# Patient Record
Sex: Female | Born: 1965 | Race: White | Hispanic: No | Marital: Married | Smoking: Current every day smoker
Health system: Southern US, Academic
[De-identification: ages and names within clinical notes are randomized; demographics above are authoritative.]

## PROBLEM LIST (undated history)

## (undated) DIAGNOSIS — K859 Acute pancreatitis without necrosis or infection, unspecified: Secondary | ICD-10-CM

## (undated) DIAGNOSIS — M1990 Unspecified osteoarthritis, unspecified site: Secondary | ICD-10-CM

## (undated) DIAGNOSIS — K219 Gastro-esophageal reflux disease without esophagitis: Secondary | ICD-10-CM

## (undated) DIAGNOSIS — T4145XA Adverse effect of unspecified anesthetic, initial encounter: Secondary | ICD-10-CM

## (undated) DIAGNOSIS — T8859XA Other complications of anesthesia, initial encounter: Secondary | ICD-10-CM

## (undated) DIAGNOSIS — G43909 Migraine, unspecified, not intractable, without status migrainosus: Secondary | ICD-10-CM

## (undated) DIAGNOSIS — M549 Dorsalgia, unspecified: Secondary | ICD-10-CM

## (undated) DIAGNOSIS — F32A Depression, unspecified: Secondary | ICD-10-CM

## (undated) DIAGNOSIS — F329 Major depressive disorder, single episode, unspecified: Secondary | ICD-10-CM

## (undated) DIAGNOSIS — G8929 Other chronic pain: Secondary | ICD-10-CM

## (undated) DIAGNOSIS — F419 Anxiety disorder, unspecified: Secondary | ICD-10-CM

## (undated) DIAGNOSIS — Z413 Encounter for ear piercing: Secondary | ICD-10-CM

## (undated) DIAGNOSIS — I6529 Occlusion and stenosis of unspecified carotid artery: Secondary | ICD-10-CM

## (undated) DIAGNOSIS — Z79899 Other long term (current) drug therapy: Secondary | ICD-10-CM

## (undated) DIAGNOSIS — Z9289 Personal history of other medical treatment: Secondary | ICD-10-CM

## (undated) DIAGNOSIS — I639 Cerebral infarction, unspecified: Secondary | ICD-10-CM

## (undated) DIAGNOSIS — E039 Hypothyroidism, unspecified: Secondary | ICD-10-CM

## (undated) DIAGNOSIS — Z7901 Long term (current) use of anticoagulants: Secondary | ICD-10-CM

## (undated) DIAGNOSIS — L818 Other specified disorders of pigmentation: Secondary | ICD-10-CM

## (undated) DIAGNOSIS — IMO0002 Reserved for concepts with insufficient information to code with codable children: Secondary | ICD-10-CM

## (undated) DIAGNOSIS — E669 Obesity, unspecified: Secondary | ICD-10-CM

## (undated) DIAGNOSIS — E119 Type 2 diabetes mellitus without complications: Secondary | ICD-10-CM

## (undated) DIAGNOSIS — Z955 Presence of coronary angioplasty implant and graft: Secondary | ICD-10-CM

## (undated) DIAGNOSIS — R6889 Other general symptoms and signs: Secondary | ICD-10-CM

## (undated) DIAGNOSIS — M51369 Other intervertebral disc degeneration, lumbar region without mention of lumbar back pain or lower extremity pain: Secondary | ICD-10-CM

## (undated) DIAGNOSIS — E079 Disorder of thyroid, unspecified: Secondary | ICD-10-CM

## (undated) DIAGNOSIS — I519 Heart disease, unspecified: Secondary | ICD-10-CM

## (undated) DIAGNOSIS — M5136 Other intervertebral disc degeneration, lumbar region: Secondary | ICD-10-CM

## (undated) DIAGNOSIS — E1165 Type 2 diabetes mellitus with hyperglycemia: Secondary | ICD-10-CM

## (undated) DIAGNOSIS — H0260 Xanthelasma of unspecified eye, unspecified eyelid: Secondary | ICD-10-CM

## (undated) DIAGNOSIS — Z951 Presence of aortocoronary bypass graft: Secondary | ICD-10-CM

## (undated) DIAGNOSIS — I2510 Atherosclerotic heart disease of native coronary artery without angina pectoris: Secondary | ICD-10-CM

## (undated) DIAGNOSIS — E041 Nontoxic single thyroid nodule: Secondary | ICD-10-CM

## (undated) DIAGNOSIS — E7800 Pure hypercholesterolemia, unspecified: Secondary | ICD-10-CM

## (undated) DIAGNOSIS — Z9884 Bariatric surgery status: Secondary | ICD-10-CM

## (undated) DIAGNOSIS — M539 Dorsopathy, unspecified: Secondary | ICD-10-CM

## (undated) DIAGNOSIS — E785 Hyperlipidemia, unspecified: Secondary | ICD-10-CM

## (undated) HISTORY — PX: ERCP: SHX60

## (undated) HISTORY — DX: Type 2 diabetes mellitus with hyperglycemia (CMS HCC): E11.65

## (undated) HISTORY — DX: Disorder of thyroid, unspecified: E07.9

## (undated) HISTORY — DX: Atherosclerotic heart disease of native coronary artery without angina pectoris: I25.10

## (undated) HISTORY — DX: Hyperlipidemia, unspecified: E78.5

## (undated) HISTORY — DX: Cerebral infarction, unspecified: I63.9

## (undated) HISTORY — DX: Nontoxic single thyroid nodule: E04.1

## (undated) HISTORY — DX: Other intervertebral disc degeneration, lumbar region without mention of lumbar back pain or lower extremity pain: M51.369

## (undated) HISTORY — PX: HX ANKLE FRACTURE TX: SHX122

## (undated) HISTORY — PX: HX TONSILLECTOMY: SHX27

## (undated) HISTORY — DX: Reserved for concepts with insufficient information to code with codable children: IMO0002

## (undated) HISTORY — PX: HX THYROID BIOPSY: 2101000001

## (undated) HISTORY — DX: Dorsalgia, unspecified: M54.9

## (undated) HISTORY — DX: Other chronic pain: G89.29

## (undated) HISTORY — DX: Pure hypercholesterolemia, unspecified: E78.00

## (undated) HISTORY — PX: HX THYROIDECTOMY: SHX17

## (undated) HISTORY — DX: Obesity, unspecified: E66.9

## (undated) HISTORY — DX: Occlusion and stenosis of unspecified carotid artery: I65.29

## (undated) HISTORY — DX: Personal history of other medical treatment: Z92.89

## (undated) HISTORY — DX: Xanthelasma of unspecified eye, unspecified eyelid: H02.60

## (undated) HISTORY — DX: Other intervertebral disc degeneration, lumbar region: M51.36

## (undated) HISTORY — DX: Anxiety disorder, unspecified: F41.9

## (undated) HISTORY — DX: Cerebral infarction, unspecified (CMS HCC): I63.9

## (undated) SURGERY — Surgical Case
Anesthesia: *Unknown

---

## 1986-12-08 HISTORY — PX: HX GALL BLADDER SURGERY/CHOLE: SHX55

## 1996-12-08 HISTORY — PX: ENDOMETRIAL ABLATION: SHX621

## 2001-03-30 ENCOUNTER — Other Ambulatory Visit: Payer: Self-pay

## 2001-03-30 ENCOUNTER — Inpatient Hospital Stay (HOSPITAL_COMMUNITY): Payer: Self-pay | Admitting: NEUROSURGERY

## 2001-04-06 ENCOUNTER — Ambulatory Visit (INDEPENDENT_AMBULATORY_CARE_PROVIDER_SITE_OTHER): Payer: Self-pay | Admitting: Neurological Surgery

## 2001-04-15 ENCOUNTER — Ambulatory Visit (INDEPENDENT_AMBULATORY_CARE_PROVIDER_SITE_OTHER): Payer: Self-pay | Admitting: Neurological Surgery

## 2001-06-25 ENCOUNTER — Encounter: Payer: Self-pay | Admitting: Emergency Medicine

## 2001-06-25 ENCOUNTER — Emergency Department (HOSPITAL_COMMUNITY): Admission: EM | Admit: 2001-06-25 | Discharge: 2001-06-25 | Payer: Self-pay | Admitting: Emergency Medicine

## 2002-04-15 ENCOUNTER — Other Ambulatory Visit: Payer: Self-pay

## 2002-04-15 ENCOUNTER — Ambulatory Visit (HOSPITAL_COMMUNITY): Payer: Self-pay | Admitting: Neuroradiology

## 2002-04-28 ENCOUNTER — Ambulatory Visit (INDEPENDENT_AMBULATORY_CARE_PROVIDER_SITE_OTHER): Payer: Self-pay | Admitting: Neurological Surgery

## 2002-12-28 ENCOUNTER — Other Ambulatory Visit: Admission: RE | Admit: 2002-12-28 | Discharge: 2002-12-28 | Payer: Self-pay | Admitting: Obstetrics and Gynecology

## 2003-03-07 ENCOUNTER — Ambulatory Visit (INDEPENDENT_AMBULATORY_CARE_PROVIDER_SITE_OTHER): Payer: Self-pay | Admitting: Neurological Surgery

## 2003-03-24 ENCOUNTER — Ambulatory Visit (HOSPITAL_COMMUNITY): Payer: Self-pay | Admitting: Neuroradiology

## 2004-03-12 ENCOUNTER — Ambulatory Visit (HOSPITAL_COMMUNITY): Payer: Self-pay | Admitting: Neuroradiology

## 2005-03-18 ENCOUNTER — Ambulatory Visit (INDEPENDENT_AMBULATORY_CARE_PROVIDER_SITE_OTHER): Payer: Self-pay | Admitting: Neurological Surgery

## 2008-08-07 ENCOUNTER — Ambulatory Visit: Payer: Self-pay | Admitting: Cardiology

## 2008-09-19 ENCOUNTER — Ambulatory Visit: Payer: Self-pay | Admitting: Cardiology

## 2008-09-29 ENCOUNTER — Emergency Department (HOSPITAL_COMMUNITY): Admission: EM | Admit: 2008-09-29 | Discharge: 2008-09-30 | Payer: Self-pay | Admitting: Emergency Medicine

## 2008-12-08 HISTORY — PX: CHOLECYSTECTOMY: SHX55

## 2009-09-10 ENCOUNTER — Ambulatory Visit: Payer: Self-pay | Admitting: Cardiology

## 2009-12-08 HISTORY — PX: COLONOSCOPY: SHX174

## 2010-02-04 ENCOUNTER — Ambulatory Visit: Payer: Self-pay | Admitting: Gastroenterology

## 2010-10-18 ENCOUNTER — Other Ambulatory Visit: Payer: Self-pay

## 2010-10-21 LAB — HISTORICAL CYTOPATHOLOGY-GYN (PAP AND HPV TESTS)

## 2010-12-08 DIAGNOSIS — I219 Acute myocardial infarction, unspecified: Secondary | ICD-10-CM | POA: Insufficient documentation

## 2010-12-08 HISTORY — DX: Acute myocardial infarction, unspecified: I21.9

## 2010-12-08 HISTORY — PX: HX CORONARY STENT PLACEMENT: SHX49

## 2010-12-08 HISTORY — PX: HX HEART CATHETERIZATION: SHX148

## 2011-02-28 ENCOUNTER — Other Ambulatory Visit (HOSPITAL_COMMUNITY): Payer: Self-pay | Admitting: Internal Medicine

## 2011-03-10 ENCOUNTER — Ambulatory Visit (HOSPITAL_BASED_OUTPATIENT_CLINIC_OR_DEPARTMENT_OTHER): Payer: Self-pay | Admitting: Cardiovascular Disease

## 2011-03-19 ENCOUNTER — Encounter (HOSPITAL_BASED_OUTPATIENT_CLINIC_OR_DEPARTMENT_OTHER): Payer: Self-pay | Admitting: Physician Assistant

## 2011-03-19 ENCOUNTER — Ambulatory Visit
Admission: RE | Admit: 2011-03-19 | Discharge: 2011-03-19 | Disposition: A | Discharge status: Home / Self Care | Payer: Medicare Other | Source: Ambulatory Visit

## 2011-03-19 ENCOUNTER — Ambulatory Visit (HOSPITAL_BASED_OUTPATIENT_CLINIC_OR_DEPARTMENT_OTHER): Payer: Medicare Other

## 2011-03-19 ENCOUNTER — Encounter (HOSPITAL_BASED_OUTPATIENT_CLINIC_OR_DEPARTMENT_OTHER): Payer: Self-pay

## 2011-03-19 VITALS — BP 126/76 | HR 115 | Ht 69.0 in | Wt 251.3 lb

## 2011-03-19 DIAGNOSIS — E785 Hyperlipidemia, unspecified: Secondary | ICD-10-CM | POA: Insufficient documentation

## 2011-03-19 DIAGNOSIS — F17200 Nicotine dependence, unspecified, uncomplicated: Secondary | ICD-10-CM | POA: Insufficient documentation

## 2011-03-19 DIAGNOSIS — Z8673 Personal history of transient ischemic attack (TIA), and cerebral infarction without residual deficits: Secondary | ICD-10-CM | POA: Insufficient documentation

## 2011-03-19 DIAGNOSIS — Z7902 Long term (current) use of antithrombotics/antiplatelets: Secondary | ICD-10-CM | POA: Insufficient documentation

## 2011-03-19 DIAGNOSIS — I6529 Occlusion and stenosis of unspecified carotid artery: Secondary | ICD-10-CM | POA: Insufficient documentation

## 2011-03-19 DIAGNOSIS — E119 Type 2 diabetes mellitus without complications: Secondary | ICD-10-CM | POA: Insufficient documentation

## 2011-03-19 DIAGNOSIS — Z8249 Family history of ischemic heart disease and other diseases of the circulatory system: Secondary | ICD-10-CM | POA: Insufficient documentation

## 2011-03-19 DIAGNOSIS — Z7982 Long term (current) use of aspirin: Secondary | ICD-10-CM | POA: Insufficient documentation

## 2011-03-19 MED ORDER — VARENICLINE 0.5 MG (11)-1 MG (42) TABLETS IN A DOSE PACK
1.00 | ORAL_TABLET | ORAL | Status: DC
Start: 2011-03-19 — End: 2011-04-10

## 2011-03-19 NOTE — Progress Notes (Signed)
Patient notified and will call PCP.      Message   No  Try OTC nicotine patches or see PCP for wellbutrin  ----- Message -----  From: Edwena Bunde, LPN  Sent: 0/27/2536 1:53 PM  To: Placido Sou, PA-C    Prince Rome,  Insurance will not pay for chantix. Can you switch to something else.  Thanks Rakesh Dutko  ----- Message -----  From: Perlie Mayo  Sent: 03/19/2011 1:31 PM  To: Card Hsc Nurses    >> WHITNEY NICOLE ROGERS 03/19/2011 01:31 PM  [facility] called in this afternoon in regards to Ms. Sanon's rx for Chantix. Her insurance will not cover it. They recommend Welbutrin or the Nicatrol inhaler. Patient's cardiologist is Dr. Caralyn Guile. Thank you!

## 2011-03-19 NOTE — Patient Instructions (Signed)
Angiography  Angiography is a procedure used to look at the blood vessels (arteries) which carry the blood to different parts of your body. In this procedure a dye is injected through a catheter (a long, hollow tube about the size of a piece of cooked spaghetti) into an artery and x-rays are taken. The x-rays will show if there is a blockage or problem in a blood vessel.    PREPARATION FOR THE PROCEDURE   Let your caregiver know if you have had an allergy to dyes used in x-ray if you have ever had kidney problems or failure.    Do not eat or drink starting from midnight up to the time of the procedure, or as directed.    You may drink enough water to take your medications the morning of the procedure if you were instructed to do so.    You should be at the hospital or outpatient facility where the procedure is to be done tbd prior to the procedure or as directed.   PROCEDURE:  1. You may be given a medication to help you relax before and during the procedure through an IV in your hand or arm.   2. A local anesthetic to make the area numb may be used before inserting the catheter.   3. You will be prepared for the procedure by washing and shaving the area where the catheter will be inserted. This is usually done in the groin but may be done in the fold of your arm by your elbow.   4. A specially trained doctor will insert the catheter with a guide wire into an artery. This is guided under a special type of x-ray (fluoroscopy) to the blood vessel being examined.   5. Special dye is then injected and x-rays are taken. These will show where any narrowing or blockages are located.   AFTER THE PROCEDURE   After the procedure you will be kept in bed for several hours.    The access site will be watched and you will be checked frequently.    Blood tests, other x-rays and an EKG may be done.    You may stay in the hospital overnight for observation.   SEEK IMMEDIATE MEDICAL CARE IF:    You develop chest pain, shortness of breath, feel faint, or pass out.    There is bleeding, swelling, or drainage from the catheter insertion site.    You develop pain, discoloration, coldness, or severe bruising in the leg or arm, or area where the catheter was inserted.    An oral temperature above 101 develops.   Document Released: 09/03/2005 Document Re-Released: 03/25/2008  [facility] Patient Information 2011 [place], [place].Carotid Angiography with Stenting  The carotid arteries are the large arteries on both sides in the front of the neck. These arteries are the main blood supply to the brain. Carotid ultrasound studies are done to look for problems in the carotid artery. Like the blood vessels of the heart (coronary arteries), the carotid arteries also develop atherosclerosis. This is the build-up of fat and cholesterol deposits, called plaque, on the inside of the arteries. Over time, the build-up may narrow the artery. The narrowing will lessen blood flow to the brain. If the brain does not get enough blood, this may lead to a stroke.    Carotid angiography is also called carotid angiogram or a carotid arteriogram. This test is done when carotid artery disease is suspected. This is based on the results from other testing such  as an ultrasound. An arteriogram is often done prior to treatment.    PROCEDURE  This is an invasive imaging procedure done in a catheterization laboratory.     A catheter (small slender tube) is put into an artery so dye can be injected. The dye allows the inside of the vessels to be seen on x-ray.    A local anesthetic (numbing medicine) is used to numb the area where the catheter will be inserted.    A small puncture is made in the arm or leg and a catheter is inserted. The catheter goes into the artery and is guided to the carotid arteries with the help of a special x-ray machine (fluoroscope).     Contrast dye is injected through the catheter. The dye is something that shows up on x-ray. This allows the radiologist (specialist in reading x-rays) to see the inside of your carotid artery.    These x-rays of your carotid arteries allow your caregiver to know if your carotid artery has narrowing or blockage.    Following the procedure, a dressing will be applied and pressure will be put on the area of where the catheter was inserted. Sometimes this is done with a sandbag. Often the pressure should last less than 24 hours.    If there are no problems, you will be allowed to go home.   If you are diagnosed as having carotid stenosis (narrowing), the carotid artery has narrowed because of the buildup of plaques (usually cholesterol, fats and cells). These changes happen as we grow older. This blockage decreases the blood flow to the brain. It may cause transient ischemic attacks (TIA's). TIA's are temporary brain changes that last less than 24 hours. If the symptoms (problems) last longer, this is considered a stroke or permanent problems in your nervous system.    SYMPTOMS   Feeling faint    Numbness in arms or legs    Difficulty with movements    Weakness in an arm or leg    Visual problems     If the artery is left untreated, you are at risk of having a stroke. If severe enough, the condition may require surgical removal of the buildup. This procedure is called a carotid endarterectomy. Another procedure used for treating a blocked carotid artery is stenting. Your caregiver will discuss what will be the best treatment in your case.   Carotid stenting is an investigational (new or experimental) procedure. A specially designed guide wire with a filter is placed beyond the area of narrowing in the carotid artery. Once in place, a small balloon is inflated for a few seconds to dilate the artery. The stent (a small titanium mesh tube that acts as a splint inside your artery to provide support) is then placed in the artery. This opens to fit the size of the artery. The filter, also known as a "distal protection device," is used to capture any particles that are released. It prevents them from going to the brain and causing a stroke. A second balloon inflation is done to make sure the stent is completely expanded in your carotid artery. The stent stays in place permanently. After several weeks, your artery will heal around the stent.    Document Released: 04/07/2005 Document Re-Released: 02/18/2010  [facility] Patient Information 2011 [place], [place].Smoking Cessation, Tips for Success  YOU CAN QUIT SMOKING  If you are ready to quit smoking, congratulations! You have chosen to help yourself be healthier. Cigarettes bring nicotine, tar, carbon monoxide,  and other irritants into your body. Your lungs, heart, and blood vessels will be able to work better without these poisons. There are lots of different ways to quit smoking. Nicotine gum, nicotine patches, a nicotine inhaler, or nicotine nasal spray help with physical craving. Hypnosis, support groups, and medicines help break the habit of smoking. Here are some tips to help you quit for good.  1. Throw away all cigarettes.   2. Clean and remove all ashtrays from your home, work, and car.   3. On a card, write down your reasons for quitting. Carry the card with you and read it when you get the urge to smoke.   4. Cleanse your body of nicotine. Drink enough water and fluids to keep your urine clear or pale yellow. Do this after quitting to flush the nicotine from your body.    5. Learn to predict your moods. Do not let a bad situation be your excuse to have a cigarette. Some situations in your life might tempt you into wanting a cigarette.   6. Never have "just one" cigarette. It leads to wanting another and another. Remind yourself of your decision to quit.   7. Change habits associated with smoking. If you smoked while driving or when feeling stressed, try other activities to replace smoking. Stand up when drinking your coffee. Brush your teeth after eating. Sit in a different chair when you read the paper. Avoid alcohol while trying to quit, and try to drink fewer caffeinated beverages. Alcohol and caffeine may urge you to smoke.   8. Avoid foods and drinks that can trigger a desire to smoke, such as sugary or spicy foods and alcohol.   9. Ask people who smoke not to smoke around you.   10. Have something planned to do right after eating or having a cup of coffee. Take a walk or exercise to perk you up. This will help to keep you from overeating.   11. Try a relaxation exercise to calm you down and decrease your stress. Remember, you may be tense and nervous in the first 2 weeks after you quit, but this will pass.   12. Find new activities to keep your hands busy. Play with a pen, coin, or rubber band. Doodle or draw things on paper.   13. Brush your teeth right after eating. This will help cut down on the craving for the taste of tobacco after meals. You can try mouthwash, too.   14. Use oral substitutes, such as lemon drops, carrots, a cinnamon stick, or chewing gum, in place of cigarettes. Keep them handy so they are available when you have the urge to smoke.   15. When you have the urge to smoke, try deep breathing.   16. Designate your home as a nonsmoking area.   17. If you are a heavy smoker, ask your caregiver about a prescription for nicotine chewing gum. It can ease your withdrawal from nicotine.    18. Reward yourself. Set aside the cigarette money you save and buy yourself something nice.   19. Look for support from others. Join a support group or smoking cessation program. Ask someone at home or at work to help you with your plan to quit smoking.   20. Always ask yourself, "Do I need this cigarette or is this just a reflex?" Tell yourself, "Today, I choose not to smoke," or "I do not want to smoke." You are reminding yourself of your decision to quit, even if you  do smoke a cigarette.   HOW WILL I FEEL WHEN I QUIT SMOKING?   The benefits of not smoking start within days of quitting.    You may have symptoms of withdrawal because your body is used to nicotine (the addictive substance in cigarettes). You may crave cigarettes, be irritable, feel very hungry, cough often, get headaches, or have difficulty concentrating.    The withdrawal symptoms are only temporary. They are strongest when you first quit but will go away within 10 to 14 days.    When withdrawal symptoms occur, stay in control. Think about your reasons for quitting. Remind yourself that these are signs that your body is healing and getting used to being without cigarettes.    Remember that withdrawal symptoms are easier to treat than the major diseases that smoking can cause.    Even after the withdrawal is over, expect periodic urges to smoke. However, these cravings are generally short-lived and will go away whether you smoke or not. Do not smoke!    If you relapse and smoke again, do not lose hope. Of the people who quit, 75% relapse. Most smokers quit 3 times before they are successful.    If you relapse, do not give up! Plan ahead and think about what you will do the next time you get the urge to smoke.   LIFE AS A NONSMOKER: MAKE IT FOR A MONTH, MAKE IT FOR LIFE  Day 1 Hang this page where you will see it every day.  Day 2 Get rid of all ashtrays, matches, and lighters.  Day 3 Drink water. Breathe deeply between sips.   Day 4 Avoid places with smoke-filled air, such as bars, clubs, or the smoking section of restaurants.  Day 5 Keep track of how much money you save by not smoking.  Day 6 Avoid boredom. Keep a good book with you or go to the movies.  Day 7 Reward yourself! One week without smoking!  Day 8 Make a dental appointment to get your teeth cleaned.  Day 9 Decide how you will turn down a cigarette before it is offered to you.  Day 10 Review your reasons for quitting.  Day 11 Distract yourself. Stay active to keep your mind off smoking and to relieve tension.  Take a walk, exercise, read a book, do a crossword puzzle, or try a new hobby.  Day 12 Exercise. Get off the bus before your stop or use stairs instead of escalators.  Day 13 Call on friends for support and encouragement.  Day 14 Reward yourself! Two weeks without smoking!  Day 15 Practice deep breathing exercises.  Day 16 Bet a friend that you can stay a nonsmoker.  Day 17 Ask to sit in nonsmoking sections of restaurants.  Day 18 Hang up "No Smoking" signs.  Day 19 Think of yourself as a nonsmoker.  Day 20 Each morning, tell yourself you will not smoke.  Day 21 Reward yourself! Three weeks without smoking!  Day 22 Think of smoking in negative ways.    Remember how it stains your teeth, gives you bad breath, and shortens your breath.  Day 23 Eat a nutritious breakfast.  Day 24 Do not relive your days as a smoker.  Day 25 Hold a pencil in your hand when talking on the telephone.  Day 26 Tell all your friends you do not smoke.  Day 27 Think about how much better food tastes.  Day 28 Remember, one cigarette is one too many.  Day 29 Take up a hobby that will keep your hands busy.  Day 30 Congratulations! One month without smoking! Give yourself a big reward.  Your caregiver can direct you to community resources or hospitals for support, which may include:   Group support.    Education.    Hypnosis.    Subliminal therapy.    Document Released: 08/22/2004 Document Re-Released: 02/18/2010  [facility] Patient Information 2011 [place], [place].

## 2011-03-19 NOTE — Progress Notes (Signed)
See dictated note.    KK

## 2011-03-24 ENCOUNTER — Ambulatory Visit (HOSPITAL_COMMUNITY): Payer: Medicare Other

## 2011-03-24 ENCOUNTER — Encounter (HOSPITAL_COMMUNITY): Payer: Self-pay

## 2011-03-24 ENCOUNTER — Ambulatory Visit (HOSPITAL_BASED_OUTPATIENT_CLINIC_OR_DEPARTMENT_OTHER)
Admission: RE | Admit: 2011-03-24 | Discharge: 2011-03-24 | Disposition: A | Discharge status: Home / Self Care | Payer: Medicare Other | Source: Ambulatory Visit

## 2011-03-24 ENCOUNTER — Inpatient Hospital Stay
Admission: RE | Admit: 2011-03-24 | Discharge: 2011-03-24 | Disposition: A | Discharge status: Home / Self Care | Payer: Medicare Other | Source: Ambulatory Visit

## 2011-03-24 ENCOUNTER — Inpatient Hospital Stay (HOSPITAL_COMMUNITY): Payer: Self-pay | Admitting: Emergency Medicine

## 2011-03-24 DIAGNOSIS — I2510 Atherosclerotic heart disease of native coronary artery without angina pectoris: Secondary | ICD-10-CM | POA: Insufficient documentation

## 2011-03-24 DIAGNOSIS — I6529 Occlusion and stenosis of unspecified carotid artery: Secondary | ICD-10-CM | POA: Insufficient documentation

## 2011-03-24 LAB — PT/INR
INR: 0.9 (ref 0.8–1.2)
PROTHROMBIN TIME: 9.9 s (ref 9.1–11.5)

## 2011-03-24 LAB — TYPE AND SCREEN
ABO/RH(D): O POS
ANTIBODY SCREEN: NEGATIVE

## 2011-03-24 LAB — BUN
BUN/CREAT RATIO: 18 (ref 6–22)
BUN: 14 mg/dL (ref 6–20)

## 2011-03-24 LAB — URINALYSIS, MACROSCOPIC AND MICROSCOPIC
BILIRUBIN: NEGATIVE
BLOOD: NEGATIVE
GLUCOSE: NEGATIVE mg/dL
KETONES: NEGATIVE mg/dL
LEUKOCYTES: NEGATIVE
NITRITE: NEGATIVE
PH URINE: 6.5 (ref 5.0–8.0)
PROTEIN: NEGATIVE mg/dL
RBC'S: 1 /HPF (ref 0–4)
SPECIFIC GRAVITY, URINE: 1.04 — ABNORMAL HIGH (ref 1.005–1.030)
UROBILINOGEN: NORMAL mg/dL
WBC'S: 1 /HPF (ref 0–6)

## 2011-03-24 LAB — CREATININE WITH EGFR
CREATININE: 0.78 mg/dL (ref 0.49–1.10)
ESTIMATED GLOMERULAR FILTRATION RATE: >59 ml/min/1.73m2 (ref >59)

## 2011-03-24 LAB — CBC/DIFF
BASOPHILS: 1 % (ref 0–1)
BASOS ABS: 0.052 THOU/uL (ref 0.0–0.2)
EOS ABS: 0.378 THOU/uL — ABNORMAL HIGH (ref 0.1–0.3)
EOSINOPHIL: 4 % (ref 1–6)
HGB: 13.9 g/dL (ref 11.5–15.2)
LYMPHOCYTES: 32 % (ref 20–45)
LYMPHS ABS: 3.05 THOU/uL (ref 1.0–4.8)
MCH: 29.6 pg (ref 27.4–33.0)
MCV: 89.4 fL (ref 82.0–99.0)
MONOCYTES: 10 % (ref 4–13)
MONOS ABS: 0.915 THOU/uL — ABNORMAL HIGH (ref 0.1–0.9)
NRBC'S: 0 /100{WBCs}
PLATELET COUNT: 360 THOU/uL (ref 140–450)
PMN ABS: 5.22 10*3/uL (ref 1.5–7.7)
RBC: 4.68 MIL/uL (ref 3.84–5.04)
RDW: 12.2 % (ref 10.2–14.0)
WBC: 9.6 THOU/uL (ref 3.5–11.0)

## 2011-03-24 LAB — PERFORM POC WHOLE BLOOD GLUCOSE: GLUCOSE, POINT OF CARE: 95 mg/dL (ref 70–105)

## 2011-03-24 LAB — ELECTROLYTES
CARBON DIOXIDE: 24 mmol/L (ref 22–32)
CHLORIDE: 108 mmol/L (ref 96–111)
POTASSIUM: 4.1 mmol/L (ref 3.5–5.1)
SODIUM: 139 mmol/L (ref 136–145)

## 2011-03-24 LAB — PTT (PARTIAL THROMBOPLASTIN TIME): APTT: 24.3 s (ref 22.0–32.0)

## 2011-03-24 MED ORDER — FENTANYL (PF) 50 MCG/ML INJECTION SOLUTION
INTRAMUSCULAR | Status: AC
Start: 2011-03-24 — End: 2011-03-24
  Filled 2011-03-24: qty 2

## 2011-03-24 MED ORDER — MIDAZOLAM 1 MG/ML INJECTION SOLUTION
INTRAMUSCULAR | Status: AC
Start: 2011-03-24 — End: 2011-03-24
  Filled 2011-03-24: qty 2

## 2011-03-24 MED ORDER — SODIUM CHLORIDE 0.9 % (FLUSH) INJECTION SYRINGE
2.00 mL | INJECTION | INTRAMUSCULAR | Status: DC | PRN
Start: 2011-03-24 — End: 2011-03-24

## 2011-03-24 MED ORDER — LIDOCAINE HCL 20 MG/ML (2 %) INJECTION SOLUTION
5.00 mL | INTRAMUSCULAR | Status: DC
Start: 2011-03-24 — End: 2011-03-24

## 2011-03-24 MED ORDER — FENTANYL (PF) 50 MCG/ML INJECTION SOLUTION
25.00 ug | INTRAMUSCULAR | Status: AC | PRN
Start: 2011-03-24 — End: 2011-03-24
  Administered 2011-03-24 (×3): 25 ug via INTRAVENOUS

## 2011-03-24 MED ORDER — SODIUM CHLORIDE 0.9 % (FLUSH) INJECTION SYRINGE
2.00 mL | INJECTION | Freq: Three times a day (TID) | INTRAMUSCULAR | Status: DC
Start: 2011-03-24 — End: 2011-03-24

## 2011-03-24 MED ORDER — DEXTROSE 5 % AND 0.45 % SODIUM CHLORIDE INTRAVENOUS SOLUTION
INTRAVENOUS | Status: DC
Start: 2011-03-24 — End: 2011-03-24
  Administered 2011-03-24: 0 via INTRAVENOUS

## 2011-03-24 MED ORDER — MIDAZOLAM 1 MG/ML INJECTION SOLUTION
0.50 mg | INTRAMUSCULAR | Status: AC | PRN
Start: 2011-03-24 — End: 2011-03-24
  Administered 2011-03-24: 1 mg via INTRAVENOUS

## 2011-03-24 MED ORDER — IODIXANOL 320 MG IODINE/ML INTRAVENOUS SOLUTION
95.00 mL | INTRAVENOUS | Status: DC
Start: 2011-03-24 — End: 2011-03-24

## 2011-03-24 NOTE — Nurses Notes (Signed)
Ambulatory. Tolerating and retaining PO fluids and solids. Able to void without difficulty.

## 2011-03-24 NOTE — Nurses Notes (Signed)
States headache rates 8 on scale of 1 to 10 that decreased to a 5 after eating and drinking. Dr.Gharib aware of headache.

## 2011-03-24 NOTE — H&P (Addendum)
[facility]   CARDIAC SURGERY DEPARTMENTCONSULTATION     History and Physical    Date of Service:  03/24/2011  Monique [name]  Date of Admission:  03/24/2011    Chief Complaint: dyspnea    Primary Service: Med C  Consult Requested By: Caralyn Guile    HPI:  This is a 45 YO female who was referred from cardiology following her elective outpatient cardiac cath.  Pt was cathed secondary to dyspnea.  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  Pt is currently asymptomatic.    ROS   Constitutional: negative  Respiratory: negative  Cardiovascular: positive for dyspnea  Gastrointestinal: negative      Information Obtained from patient    Past Medical History   Diagnosis Date   . Diabetes mellitus type II    . Hyperlipidemia    . Stroke    . HTN    . Seizure          Cardiac Risk Factors:    CAD:  Yes  MI: No  History of CVA: No  History of TIA: No  Dyslipidemia: Yes  Hyperlipidemia: Yes  Tobacco use current:  reports that she has been smoking cigarettes.  She has a 5 pack-year smoking history. She does not have any smokeless tobacco history on file.  Illicit drug use: No  ETOH:  reports that she does not drink alcohol.  Obesity: Yes  History of stents/PTCA: No  Pacemaker/ICD: No  Diabetes managed by: Oral Meds  PVD: No  Family History of CAD female less than 100 yrs of age: No  Family History of CAD female less than 42 yrs of age: No  Diagnosis of Sleep apnea CPAP:not used   Home oxygen currently used: No  History of radiation to mediastium: No  Cancer within 5 years: No  Syncope:  no  Any heart failure within 2 weeks:  No    Allergies   Allergen Reactions   . No Known Drug Allergies          Prior to Admission Medications:  Medications Prior to Admission    Medication    Aspirin 81 mg Oral Tablet    take 81 mg by mouth Once a day.    glimepiride (AMARYL) 2 mg Oral Tablet    take 2 mg by mouth. Twice daily     metformin (GLUCOPHAGE) 500 mg Oral Tablet    take 500 mg by mouth Twice daily with food.     niacin (NIASPAN) 500 mg Oral Tablet Sustained Release    take 500 mg by mouth Once a day.    Hydrocodone-Acetaminophen (NORCO) 7.5-325 mg Oral Tablet    take 1 Tab by mouth Every 6 hours as needed.    Amlodipine-Atorvastatin (CADUET) 5-40 mg Oral Tablet    take 1 Tab by mouth Once a day.    atorvastatin (LIPITOR) 40 mg Oral Tablet    take 40 mg by mouth Once a day.    varenicline (CHANTIX) dose pak     take 1 Tab by mouth. Starter pack, take as directed.    amlodipine (NORVASC) 5 mg Oral Tablet    take 5 mg by mouth Once a day.    clopidogrel (PLAVIX) 75 mg Oral Tablet    take 75 mg by mouth Once a day.           Current Inpatient Medications:    Current facility-administered medications:  NS flush syringe  2 mL Intracatheter Q8HRS   NS  flush syringe  2-6 mL Intracatheter Q1 MIN PRN   D5W 1/2 NS premix infusion   Intravenous Continuous   fentanyl (SUBLIMAZE) 50 mcg/mL injection  25-50 mcg Intravenous Cath Lab 3rd Floor Q5 Min PRN   midazolam (VERSED) 1 mg/mL injection  0.5-2 mg Intravenous Cath Lab 3rd Floor Q5 Min PRN   iodixanol (VISIPAQUE 320) infusion  95 mL Intravenous Give in Cath Lab 3rd floor   lidocaine 2% injection  5 mL Intradermal Give in Cath Lab 3rd floor         Past Surgical History   Procedure Date   . Hx cholecystectomy    . Hx tonsillectomy    . Hx cataract removal      r and l eyes with implants         Family History   Problem Relation Age of Onset   . Diabetes Mother    . Heart Attack Mother    . Hypertension Mother        History   Substance Use Topics   . Smoking status: Current Everyday Smoker -- 0.2 packs/day for 20 years     Types: Cigarettes   . Smokeless tobacco: Not on file   . Alcohol Use: No         Exam: General: appears in good health  Lungs: Clear to auscultation bilaterally.   Cardiovascular: regular rate and rhythm  Abdomen: Soft, non-tender  Extremities: No cyanosis or edema:       Labs:  BMP:     Recent Labs   [facility] 03/24/11 0945   . SODIUM 139   . POTASSIUM 4.1    . CHLORIDE 108   . CO2 24   . BUN 14   . CREATININE 0.78   . GLUCOSENF 142*   . GLUCOSEFAST --   . ANIONGAP 7   . BUNCRRATIO 18   . GFR >59   . CALCIUM --         Diagnostic Tests: cardiac catheterization:  see merlin    ASSESSMENT & PLAN:    Patient Active Problem List   Diagnoses Date Noted   . Xanthelasma [id number]] 03/19/2011   . Carotid Artery Disease [433.11] 03/18/2005   . Carotid Artery Disease [433.10] 03/12/2004   . Diabetes Mellitus, Type II [250.00A] 03/24/2003   . Hypercholesterolemia [272.0] 04/15/2002   . Hypertension [401.9] 04/15/2002   . CVA [434.91] 03/30/2001   . Stroke [436] 03/30/2001       CAD:  Will schedule CABG to be performed on 04/01/11.  Pt will have CXR. UA and PFT'S prior to hospital DC today.  Pt to stop plavix and Caduet today.  Pt to stop DM oral medicine the Sunday prior to surgery.  Consent signed    Leeroy Bock, PA-C 03/24/2011, 3:31 PM  Saw patient yesterday with PA. Agree with above findings. Risk, benefits and alternatives discussed with patient. Will schedule CABG next wekk after 5-7 days off plavix.  I personally saw and examined the patient. See [facility]'s note for additional details.    Ellery Plunk, MD 03/25/2011, 1:09 PM

## 2011-03-24 NOTE — Nurses Notes (Signed)
Pt. To radiology for chest x ray. Urinalysis sent. Pulmonary function test not done due to availability of staff post bedrest.

## 2011-03-24 NOTE — Discharge Instructions (Signed)
CARDIAC CATHETERIZATION DISCHARGE INSTRUCTIONS    Diet: Cardiac--low salt, low fat, and high fiber.    Activity: As tolerated with no driving or heavy lifting/ pushing or pulling (10 pounds limit) for 3 days.    Wound Care: You may remove groin dressing 24 hours after it was applied. Shower, no tub baths for 3 days! Wash site with anti-bacterial soap, rinse and pat dry. Leave open to air and do not apply any lotions, ointments or creams to site.    Special Instructions: Watch groin site for signs of infection--red, swelling, purulent drainage, odor, hot to touch or fever greater than 100.5 (Notify physician if you experience any of these symptoms or any questions. Notify doctor if you have any changes in sensation of leg or foot- pain, numbness, color change (pale or blue), temperature (coolness).  ____________________

## 2011-03-24 NOTE — Nurses Notes (Signed)
Pt to Cath Lab for heart catherterization with possible intervention and cerebral angiogram.  See pt chart and flow sheet for consent and vitals.  Pt placed on table, prepped, and being hemodynamic monitors. Sedation given per orders.       No intervention.  CT consult.

## 2011-03-24 NOTE — H&P (Addendum)
[facility]                                                     H&P Update Form    Monique [name], Monique [name], 45 y.o. female  Date of Admission:  03/24/2011  Date of Birth:  17-Dec-1965    03/24/2011    STOP: IF H&P IS GREATER THAN 30 DAYS FROM SURGICAL DAY COMPLETE NEW H&P IS REQUIRED.    Outpatient Pre-Surgical H & P updated the day of the procedure.  1.  H&P completed within 30 days of surgical procedure was performed by Dr. Caralyn Guile on 03/19/2011 and has been reviewed, the patient has been examined and is updated as follows: Cardiovascular:  regular rate and rhythm, S1, S2 normal, no murmur, click, rub or gallop, patient is complaining of intermittent chest pain at rest and exertion since last seen in the clinic.      Change in medications: No      Last Menstrual Period: Not applicable      Comments:     2.  Patient continues to be appropiate candidate for planned surgical procedure. YES   will plan to do left heart cath along with carotid angiogram via femoral access.      Masroor Lezlie Octave, MD      I have seen and evaluated the patient jointly with Dr. Lezlie Octave and agree with his assessment, recommendations and plans.    Carmie End, MD

## 2011-03-25 ENCOUNTER — Ambulatory Visit (HOSPITAL_BASED_OUTPATIENT_CLINIC_OR_DEPARTMENT_OTHER): Payer: Self-pay | Admitting: Interventional Cardiology

## 2011-03-25 NOTE — Telephone Encounter (Signed)
Patient called twice regarding swelling in her groin.  She reports that swelling had become large by second call.  Advised her to go to local emergency room.  EMS was in route while I spoke to her.  Advised husband that he could hold pressure to groin.      [name], MD 03/25/2011, 2:02 PM  Cardiology Fellow  [facility]

## 2011-03-25 NOTE — Telephone Encounter (Signed)
Message copied by [name] on Tue Mar 25, 2011  2:00 PM  ------       Message from: Lewayne Bunting       Created: Mon Mar 24, 2011  9:30 PM         >> Edson Snowball PRICE 03/24/2011 09:30 PM       Husband calling stating his wife is having excessive swelling in the area where the carotid artery graft was done.  Paged and connected the call to Dr Channing Mutters for further assistance.  Sprice, 03/24/11

## 2011-03-25 NOTE — Procedures (Signed)
[facility]                                    SECTION OF CARDIOLOGY                               CARDIAC DIAGNOSTIC LABORATORIES                                     [phone]    Department of Medicine      School of Medicine  Section of Cardiology      Medical Center                            CARDIAC CATHETERIZATION PROCEDURE REPORT    PATIENT NAME:  Monique [name], Monique [name]   HOSPITAL FAOZHY:[id number]  DATE OF BIRTH: February 13, 1966  PROCEDURE DATE: 03/24/2011    NAME OF PROCEDURES:  1.  Left heart catheterization with left ventricular function study.  2.  Selective coronary angiography.  3.  Ascending aortogram.  4.  Cerebral angiography.  5.  Selective left and right carotid angiography.    OPERATORS:  Carmie End MD and Donella Stade Roda-Renzelli MD.    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  This cerebral angiogram was performed in [facility] by a vascular surgeon.  We did not have films to review at that time.  She states that she has had a history of transient ischemic attacks in the past.  She was seen by Dr. Caralyn Guile as a new patient visit for carotid stenosis.  The patient also relates a history of shortness of breath with exertion.  She also has some angina symptoms, so the plan was to perform left heart catheterization with left ventricular function study, coronary angiography and also cerebral angiography and carotid angiography.  The patient was agreeable to the procedure.  Her cardiac risk factors include diabetes mellitus and dyslipidemia.  She has never had coronary angiography in the past.     DESCRIPTION OF PROCEDURE:  After informed consent was obtained, the patient was brought to the cardiac catheterization laboratory where she was prepped and draped in the usual sterile fashion.  Incremental doses of both Versed and fentanyl were used to achieve adequate level of patient sedation.  For local anesthesia, approximately 15 mL of 2% lidocaine was infiltrated in the area of the right common femoral artery.  Using the modified Seldinger technique, a 6-French Pinnacle sheath was introduced in the right common femoral artery.  Under direct fluoroscopic guidance, a 6-French JL4 diagnostic coronary catheter was advanced up to the level of the left main coronary ostium.  The left coronary anatomy was viewed in several angles.  She was found to have severe stenosis of the ostial LAD.  We exchanged this catheter for 6-French JR4 diagnostic coronary catheter was advanced up to the level of the ascending aorta and engaged the right coronary ostium.  This was viewed in 2 angles.  We used this right coronary catheter and passed in the left ventricle measured pressure measurements in the left ventricle and upon pullback across the aortic valve.  We also did a hand injection  for left ventriculogram using approximately 10 mL of Optiray dye.  We used this 6-French JR4 diagnostic coronary catheter to engage the right brachiocephalic artery.  We did selective right common carotid artery angiography and also intracerebral angiography using this with digital subtraction angiography.  We took the [facility] and since the patient had a bovine arch, we used to engage the left common carotid from the brachiocephalic artery and this was viewed with digital subtraction angiography.  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  We exchanged this catheter for a 6-French pigtail catheter,  advanced up to the level of the ascending aorta and an arch aortogram was performed using 30 mL of contrast at 15 mL per second at 600 psi.  This catheter was then removed.  The patient underwent right common femoral artery angiogram and had Mynx closure to the right common femoral artery without complication.  Cardiothoracic surgery was consulted.  The patient tolerated the entire procedure well.  She received a total of 200 mL of Visipaque dye under 7.8 minutes of total fluoroscopy time.    FINDINGS:    HEMODYNAMICS:  Left ventricular end-diastolic pressure was approximately 14 mmHg.  There was no gradient upon pullback across the aortic valve.    LEFT VENTRICULAR FUNCTION STUDY:  Left ventriculogram displayed an ejection fraction of approximately 50-55%.  There was mild anterior wall hypokinesia.  There was no mitral valve regurgitation.    CORONARY ANGIOGRAPHY:    LEFT MAIN CORONARY ARTERY:  Had a distal 30-40% stenosis.    LEFT ANTERIOR DESCENDING CORONARY ARTERY:  Had an ostial 90% stenosis, then proximal to mid luminal irregularities.  The distal LAD had luminal irregularities.  The diagonal #2 had no significant disease.  The diagonal #1 had an ostial 80% stenosis.    LEFT CIRCUMFLEX CORONARY ARTERY:  Was large dominant vessel.  Obtuse marginal branch #1 was small.  The obtuse marginal branch #2 was moderate size with diffuse mild disease.  The obtuse marginal branch #3 had a proximal to mid 70% stenosis.  Left posterior descending artery had luminal irregularities.    RIGHT CORONARY ARTERY:  Small and nondominant with mild diffuse disease.    CAROTID ANGIOGRAPHY:    RIGHT COMMON CAROTID ARTERY:  Had luminal irregularities.    RIGHT EXTERNAL CAROTID ARTERY:  Had luminal irregularities.    RIGHT INTERNAL CAROTID ARTERY:  Had a proximal 20-30% stenosis.    RIGHT ANTERIOR CEREBRAL ARTERY:  Appeared to fill via the left posterior system.    RIGHT MIDDLE CEREBRAL ARTERY:  Had mild nonsignificant disease.     RIGHT VERTEBRAL ARTERY:  Had no significant disease.    LEFT COMMON CAROTID ARTERY:  Had luminal irregularities.    LEFT INTERNAL CAROTID ARTERY:  Was totally occluded.    LEFT EXTERNAL CAROTID ARTERY:  Had luminal irregularities.    LEFT VERTEBRAL ARTERY:  Had mild disease.    IMPRESSION:  1.  Severe ostial left anterior descending artery lesion in a left dominant system.  2.  Significant obtuse marginal branch #3 disease.  3.  Normal left ventricular systolic function.  4.  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    RECOMMENDATIONS:  1.  Cardiothoracic surgery consult for coronary artery bypass grafting as this is a high-risk ostial left anterior descending artery lesion.  2.  Medical therapy for cerebrovascular disease.  3.  Medical therapy for coronary artery disease.  The patient will be scheduled for outpatient coronary artery bypass grafting to be performed by Dr. Francesco Runner.  4.  The patient will be discharged to home if all appropriate discharge criteria have been met.      Daria Pastures, MD  Fellow  Interventional Cardiology  [place] Department of Medicine    Carmie End, MD  Assistant Professor, Section of Cardiology  [facility] Department of Medicine    [id number]; D: 03/24/2011 15:54:21; T: 03/25/2011 07:45:38    cc: Mahyar Earl Lites DO      [address]      [place], [place] [address]        See fellow's note for details. I saw and evaluated the patient.  I agree with the fellow's description of the procedure, findings, impressions, and recommendations. I was present for the surgical procedure and otherwise immediately available for the duration of the procedure.  I have reviewed the images and confirmed or revised the interpretation as documented by the fellow.  Any exceptions are noted. I was present and supervised all procedures performed and participated in all critical aspects of these procedures.      Carmie End, MD

## 2011-03-26 ENCOUNTER — Encounter (HOSPITAL_BASED_OUTPATIENT_CLINIC_OR_DEPARTMENT_OTHER): Payer: Self-pay | Admitting: NURSE PRACTITIONER

## 2011-03-26 ENCOUNTER — Encounter (HOSPITAL_BASED_OUTPATIENT_CLINIC_OR_DEPARTMENT_OTHER): Payer: Self-pay | Admitting: CARDIAC SURGERY-RUBY

## 2011-03-26 NOTE — Progress Notes (Signed)
Called and spoke with Monique [name] about her surgery.  Discussed the fact that we had faxed an order to [facility] for PFT's to be performed there prior to CABG scheduled 4/24.  Consent signed and witnessed while in the hospital by Arva Chafe, PA-C and Dr. Francesco Runner. Educated pt on pre-op, intra-op, and post-op protocols to include NPO after MN, hibiclense shower, sternal precautions, chest tubes, fluid restrictions, strict I/O, daily weights, length of surgery, length of stay, medicines, ambulation, diet, post op restrictions and cardiac rehab.  Pt already directed to stop plavix now by Dr. Francesco Runner and plans to stop her Glucophage on Sunday the 22nd.  Directed to see if she can get some Hibiclens soap at [facility] and if not, to advise the pre-op holding RN that she will need 2 preps upon arrival to the hospital on the day of surgery.  Family house referral set up for the night before surgery.  Surgical consent scanned into Careers information officer. All questions answered at this time.  Maureen Chatters, RN 03/26/2011, 2:47 PM

## 2011-03-27 ENCOUNTER — Encounter (HOSPITAL_COMMUNITY): Payer: Self-pay

## 2011-03-27 ENCOUNTER — Ambulatory Visit
Admission: RE | Admit: 2011-03-27 | Discharge: 2011-03-27 | Disposition: A | Discharge status: Home / Self Care | Payer: Medicare Other | Source: Ambulatory Visit

## 2011-03-27 ENCOUNTER — Emergency Department (HOSPITAL_COMMUNITY): Payer: Managed Care, Other (non HMO)

## 2011-03-27 ENCOUNTER — Emergency Department (HOSPITAL_COMMUNITY)
Admission: EM | Admit: 2011-03-27 | Discharge: 2011-03-27 | Disposition: A | Discharge status: Home / Self Care | Payer: Managed Care, Other (non HMO) | Attending: Emergency Medicine | Admitting: Emergency Medicine

## 2011-03-27 DIAGNOSIS — I059 Rheumatic mitral valve disease, unspecified: Secondary | ICD-10-CM | POA: Insufficient documentation

## 2011-03-27 DIAGNOSIS — G2581 Restless legs syndrome: Secondary | ICD-10-CM | POA: Insufficient documentation

## 2011-03-27 DIAGNOSIS — R0789 Other chest pain: Secondary | ICD-10-CM | POA: Insufficient documentation

## 2011-03-27 DIAGNOSIS — L818 Other specified disorders of pigmentation: Secondary | ICD-10-CM | POA: Insufficient documentation

## 2011-03-27 HISTORY — DX: Gastro-esophageal reflux disease without esophagitis: K21.9

## 2011-03-27 HISTORY — DX: Encounter for ear piercing: Z41.3

## 2011-03-27 HISTORY — DX: Other general symptoms and signs: R68.89

## 2011-03-27 HISTORY — DX: Other specified disorders of pigmentation: L81.8

## 2011-03-27 LAB — POCT I-STAT, CHEM 8
BUN: 13 mg/dL (ref 6–23)
Calcium, Ion: 1.08 mmol/L — ABNORMAL LOW (ref 1.12–1.32)
Chloride: 109 mEq/L (ref 96–112)
Creatinine, Ser: 0.7 mg/dL (ref 0.4–1.2)
Glucose, Bld: 82 mg/dL (ref 70–99)
HCT: 38 % (ref 36.0–46.0)
Hemoglobin: 12.9 g/dL (ref 12.0–15.0)
Potassium: 3.7 mEq/L (ref 3.5–5.1)
Sodium: 140 mEq/L (ref 135–145)
TCO2: 21 mmol/L (ref 0–100)

## 2011-03-27 LAB — POCT CARDIAC MARKERS
CKMB, poc: <1 ng/mL — ABNORMAL LOW (ref 1.0–8.0)
Myoglobin, poc: 24.3 ng/mL (ref 12–200)
Troponin i, poc: <0.05 ng/mL (ref 0.00–0.09)

## 2011-03-28 ENCOUNTER — Encounter (HOSPITAL_COMMUNITY): Payer: Self-pay

## 2011-03-28 ENCOUNTER — Ambulatory Visit (HOSPITAL_COMMUNITY)
Admission: RE | Admit: 2011-03-28 | Discharge: 2011-03-28 | Disposition: A | Discharge status: Home / Self Care | Payer: Medicare Other | Source: Ambulatory Visit

## 2011-03-28 ENCOUNTER — Other Ambulatory Visit (HOSPITAL_BASED_OUTPATIENT_CLINIC_OR_DEPARTMENT_OTHER): Payer: Self-pay | Admitting: Surgical

## 2011-03-28 MED ORDER — DEXTROSE 5 % IN WATER (D5W) INTRAVENOUS SOLUTION
2.00 g | Freq: Once | INTRAVENOUS | Status: DC
Start: 2011-03-31 — End: 2011-03-31
  Filled 2011-03-28 (×2): qty 20

## 2011-03-28 MED ORDER — SODIUM CHLORIDE 0.9 % INTRAVENOUS SOLUTION
Freq: Once | INTRAVENOUS | Status: DC | PRN
Start: 2011-03-31 — End: 2011-03-31
  Filled 2011-03-28 (×2): qty 3

## 2011-03-28 MED ORDER — HEPARIN (PORCINE) 10,000 UNIT/ML INJECTION SOLUTION
Freq: Once | INTRAMUSCULAR | Status: DC | PRN
Start: 2011-03-31 — End: 2011-03-31
  Filled 2011-03-28 (×3): qty 0.2

## 2011-03-28 MED ORDER — LACTATED RINGERS INTRAVENOUS SOLUTION
3.00 | Freq: Once | INTRAVENOUS | Status: DC
Start: 2011-03-31 — End: 2011-03-31
  Filled 2011-03-28 (×4): qty 500

## 2011-03-28 NOTE — H&P (Signed)
 [facility]  [address]  [place], [place] [address]    HISTORY AND PHYSICAL    PATIENT NAME: Monique [name], Monique [name]  CHART NUMBER: [id number]  DATE OF BIRTH: [birth year]  DATE OF SERVICE: 03/19/2011    PRIMARY CARE PHYSICIAN:    Trinidad Nani, DO   [address], [address]   [place], [place]  [address]    CHIEF COMPLAINT:  Carotid artery blockage.    HISTORY OF PRESENT ILLNESS:  Monique [name] is a 45 year old female with a significant history of TIAs, beginning in 2002.  She states that she woke up in the middle of the night with left arm pain in 2002 and was unable to sleep.  She went to the emergency room.  They felt the left arm pain was mostly musculoskeletal but she convinced them to do further testing.  She ended up having an abnormal scan of some sort and was transferred here and underwent cerebral angiography.  She reports she had that repeated every several years.    Review of her [facility] chart reveals that she has a history of CVA and a right supraclinoid internal carotid artery stenosis.  The discharge summary states that she had weakness and difficulty with grip.  She underwent cerebral arteriogram at that time.  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  The patient's symptoms improved.  Her carotid duplex at that time was essentially normal.  She was followed up after that about a month later in the Neurosurgery Clinic.  They felt at that time that an extracranial and intracranial bypass would not relieve her symptoms.  They stated at that time should she fail medical management or have another TIA, then they would recommend an extracranial/intracranial bypass that would occlude the carotid artery and give her a high flow saphenous vein bypass.  The patient continued to follow up with our clinic.  She continues to get continued medical treatment of this right ICA supraclinoid stenosis.  She underwent several angiograms and eventually was able to follow up  p.r.n.  She stated that sometime around last fall she was told that her carotid artery was blocked.  She has been told there is nothing that can be done so she presents today for another opinion.  She has no neurological symptoms of TIA, weakness, or loss of vision or speech.  She has seen a neurologist and a vascular surgeon in the [place] area.    Furthermore, the patient was recently also in the emergency room for midscapular pain.  She was given Toradol  in the emergency room on Easter night.  She did have this 1 episode of mid scapular pain, but it is not exertional.  She has not had it since that day, but this may warrant further testing in the future depending on how her symptoms and diagnoses evolve.    REVIEW OF SYSTEMS:  Constitutional:  Denies fever and chills.  Skin:  Denies rashes and itching.  HEENT:  Denies difficulty hearing or headaches.  Eyes:  Denies any blurring, double or loss of vision.  Cardiovascular:  Denies any chest pain, angina, palpitations, leg swelling or shortness of breath from lying flat.  She does state that occasionally she will have some lower extremity swelling if she is at work all day and her legs are dependent.  Respiratory:  Denies shortness of breath and coughing.  GI:  Positive for frequent heartburn.  Genitourinary:  Denies difficulty passing urine.  Musculoskeletal:  Positive  for joint, back and neck pain.  Neurological:  As per HPI.  Heme:  Denies easy bruising and bleeding.  Psych:  Denies anxiety and depression.    PAST MEDICAL HISTORY:  Diabetes mellitus, hyperlipidemia, and history of intracranial stenosis back in 2002, as outlined above, as well as history of TIA and CVA in the past.    PAST SURGICAL HISTORY:  Includes multiple cerebral angiographies performed at this facility and a tonsillectomy.    SOCIAL HISTORY:  She continues to smoke a pack per day or less since she has been 45 years old.  She is unemployed and disabled from her history of  stroke.    FAMILY HISTORY:  Her mom had coronary disease in her mid 49s.    ALLERGIES:  No known drug allergies.    MEDICATIONS:  1.  Aspirin  81 mg daily.  2.  Lipitor 40 mg daily.  3.  Amaryl.  4.  Norco.  5.  Glucophage .    6.  Niacin .  7.  Chantix .    PHYSICAL EXAMINATION:  Today, the blood pressure was 126/76, heart rate was 115.  General:  She is a pleasant, middle-aged female who is somewhat nervous.  Cardiovascular exam revealed regular rate and rhythm, no significant murmurs.  Her lungs are clear.  Her neck, I was unable to appreciate any bruit.  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  Lower extremities had no edema.  Psych:  Affect was slightly anxious but otherwise normal.  Neurological exam was grossly intact.     Electrocardiogram performed in clinic demonstrated sinus rhythm with poor R-wave progression.    RECORDS REVIEWED:  October 20, 2010, the patient was admitted to [facility] in [place].  I have the results of a basic metabolic panel that is within normal limits.  The LDL was 100, HDL 35, triglycerides were 260, total cholesterol was 187.  TSH was normal.  A1c is 12.7.  CBC is within normal limits.      She had a CTA of the carotids and the neck area on October 21, 2010.  Impression:  As noted on the recent duplex ultrasound, there is complete occlusion of the left internal carotid artery at its origin.  A very tiny amount of enhancement is seen in the proximal left ICA/bulb, but there is definitely occlusion of this vessel.  The soft tissue swelling of the lumen is not calcified and this may represent a combination of fibrous plaque and acute thrombus.  Please correlate clinically.  No evidence of significant stenosis involving the right carotid system and both vertebral arteries enhance normally.    Duplex of the carotids on October 20, 2010, from [facility].  Impression:  There appears to be occlusion of the left internal carotid  artery.  May want to correlate with CTA and/or MRA.  There is no hemodynamically significant stenosis detected in the right internal carotid artery.  There is antegrade flow in both vertebrals.    CT of the head without contrast shows no acute intracranial hemorrhage, remote ischemic changes noted in the left frontal lobe and right parietal lobe.  There are 1 cm calcific densities noted along the left cerebral convexity, question meningioma.    Review of her admitting H&P, she was admitted and neurology was consulted for left arm paresthesias October 21, 2010, and will be scanned into her chart.    IMPRESSION/PLAN:  1.  Complete left internal carotid artery occlusion at the origin  per CTA from [facility].  This is somewhat confusing as it states her right carotid system is normal and she has a history of a right supraclinoid internal carotid artery stenosis.  We scheduled the patient regardless for a carotid angiography to further evaluate her carotid disease.  She certainly has premature peripheral vascular disease.  She is 45 years old, continues to smoke and is diabetic.  She should continue on aspirin  therapy.  We will make further recommendations after the results of her carotid angiography.  2.  Diabetes mellitus.  Hemoglobin A1c was significantly elevated in November.  She should have have more followup with this per her primary care physician and recommend hemoglobin A1c less than 7.  3.  Tobacco dependence.  The patient has been smoking about a pack per day or more since she was 45 years old, so for almost 30 years.  We recommended strongly that she quit smoking due to the significant carotid disease that has been recently discovered and was present in the past.  We did give her Chantix  to try to start taking if she is ready to quit.  Again, Dr. Minerva probably spent about 15 to 20 minutes discussing the implications of her continued smoking and she seemed to understand.  4.  Hyperlipidemia.   She is on Lipitor.  Recommend LDL less than 100, but preferably less than 70.  5.  She will return to clinic after her procedure, sooner if needed.      Francetta Ilg Katona, PA-C  [place] Department of Medicine, Section of Cardiology    Seferino Minerva, MD  Assistant Professor, Section of Cardiology  [facility] Department of Medicine    [id number]; D: 03/25/2011 09:33:58; T: 03/25/2011 10:38:30    cc: Mahyar Malena DO      [address]      [place], [place] [address]        I have seen and evaluated the patient jointly with Nakshatra Klose Katona PA-C and agree with her assessment, recommendations and plans.    Seferino Minerva, MD

## 2011-03-28 NOTE — Anesthesia Preprocedure Evaluation (Signed)
Anesthesia type: general    ASA 3        Patient's NPO status is appropriate for Anesthesia.      Anesthetic plan and risks discussed with patient.                  The Anesthesia Plan above was formulated based on the history, results, and physical exam performed immediately prior to the procedure/surgery.    4-12 cath IMPRESSION:   1. Severe ostial left anterior descending artery lesion in a left dominant system.   2. Significant obtuse marginal branch #3 disease.   3. Normal left ventricular systolic function.   4. 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  EKG in [place]  CXR: In last year  Other Studies:In [facility] [facility]  Consults: None    STOP BANG Score (0-8):     Patient instructed to hold all medications day of surgery.

## 2011-03-31 ENCOUNTER — Inpatient Hospital Stay
Admission: RE | Admit: 2011-03-31 | Discharge: 2011-04-06 | DRG: 236 | Disposition: A | Discharge status: Home / Self Care | Payer: Medicare Other | Source: Ambulatory Visit | Attending: CARDIAC SURGERY-RUBY | Admitting: CARDIAC SURGERY-RUBY

## 2011-03-31 ENCOUNTER — Encounter (HOSPITAL_COMMUNITY)
Admission: RE | Disposition: A | Discharge status: Home / Self Care | Payer: Self-pay | Source: Ambulatory Visit | Attending: CARDIAC SURGERY-RUBY

## 2011-03-31 ENCOUNTER — Inpatient Hospital Stay (HOSPITAL_COMMUNITY): Payer: Medicare Other | Admitting: CARDIAC SURGERY-RUBY

## 2011-03-31 ENCOUNTER — Encounter (HOSPITAL_COMMUNITY): Payer: Self-pay | Admitting: Family

## 2011-03-31 ENCOUNTER — Encounter (HOSPITAL_COMMUNITY): Payer: Self-pay

## 2011-03-31 ENCOUNTER — Inpatient Hospital Stay (HOSPITAL_COMMUNITY): Payer: Medicare Other

## 2011-03-31 ENCOUNTER — Inpatient Hospital Stay (HOSPITAL_COMMUNITY): Payer: Medicare Other | Admitting: Certified Registered"

## 2011-03-31 HISTORY — PX: HX CORONARY ARTERY BYPASS GRAFT: SHX141

## 2011-03-31 LAB — VENOUS BLOOD GAS/LACTATE/CO-OX/LYTES (NA/K/CA/CL/GLUC) (TEMP COMP)
%FIO2: 100 %
BASE DEFICIT: 4.6 mmol/L — ABNORMAL HIGH (ref 0.0–2.0)
BICARBONATE: 21.4 mmol/L — CL (ref 22.0–26.0)
CARBOHYHEMOGLOBIN: 1.1 % (ref 0.0–1.5)
CHLORIDE: 111 mmol/L (ref 96–111)
CO2T: 40 MM HG (ref 33.1–43.1)
GLUCOSE: 149 mg/dL — ABNORMAL HIGH (ref 70–105)
HEMATOCRIT: 23 % — ABNORMAL LOW (ref 33.5–45.2)
HEMOGLOBIN: 7.6 g/dL — ABNORMAL LOW (ref 12.0–16.0)
LACTATE: 2 mmol/L — ABNORMAL HIGH (ref 0.9–1.7)
MET-HEMOGLOBIN: 1.3 % (ref 0.0–3.0)
O2HB: 96.1 % (ref 40.0–70.0)
PCO2: 41 mm Hg (ref 41.0–51.0)
PH: 7.32 (ref 7.310–7.410)
PHT: 7.33 — CL (ref 7.350–7.450)
PO2: 252 mm Hg — ABNORMAL HIGH (ref 35–50)
PO2T: 248 mm Hg (ref 72–100)
SODIUM: 137 mmol/L (ref 136–145)
TEMPERATURE, COMP: 36.2 C (ref 15.0–40.0)
WHOLE BLOOD K+: 3.7 mmol/L (ref 3.5–5.0)

## 2011-03-31 LAB — ARTERIAL BLOOD GAS/K/CA/CO-OX
%FIO2: 50 % (ref 21–100)
BASE DEFICIT: 6.2 mmol/L — ABNORMAL HIGH (ref 0.0–3.0)
BICARBONATE: 20 mmol/L (ref 18.0–29.0)
CARBOXYHEMOGLOBIN: 0.7 % (ref 0.0–2.5)
HEMATOCRIT: 30 % — ABNORMAL LOW (ref 33.5–45.2)
HEMOGLOBIN: 10.1 g/dL — ABNORMAL LOW (ref 12.0–16.0)
IONIZED CALCIUM: 1.18 mmol/L — ABNORMAL LOW (ref 1.30–1.46)
MET-HEMOGLOBIN: 0.1 % (ref 0.0–3.0)
O2CT: 13.4 — ABNORMAL LOW (ref 15.7–21.6)
OXYHEMOGLOBIN: 93.8 % (ref 85.0–98.0)
PCO2: 39 mm Hg (ref 33.1–43.1)
PH: 7.31 — ABNORMAL LOW (ref 7.350–7.450)
PIO2/FIO2 RATIO: 140 — ABNORMAL LOW (ref >300)
PO2: 70 mm Hg — ABNORMAL LOW (ref 72–100)
WHOLE BLOOD K+: 3.5 mmol/L (ref 3.5–5.0)

## 2011-03-31 LAB — ARTERIAL BLOOD GAS/K/CA (TEMP COMP)
BASE DEFICIT: 1.7 mmol/L (ref 0.0–3.0)
BICARBONATE: 23.7 mmol/L (ref 18.0–29.0)
CO2T: 29 MM HG — ABNORMAL LOW (ref 33.1–43.1)
IONIZED CALCIUM: 1.05 mmol/L — ABNORMAL LOW (ref 1.30–1.46)
PCO2: 33 mm Hg — ABNORMAL LOW (ref 33.1–43.1)
PH: 7.43 (ref 7.350–7.450)
PHT: 7.47 (ref 7.350–7.450)
PIO2/FIO2 RATIO: 459 (ref >300)
PO2: 321 mm Hg (ref 72–100)
PO2T: 306 mm Hg (ref 72–100)
TEMPERATURE, COMP: 34 C (ref 15.0–40.0)
WHOLE BLOOD K+: 4.1 mmol/L (ref 3.5–5.0)

## 2011-03-31 LAB — VENOUS BLOOD GAS/CO-OX (TEMP COMP)
%FIO2: 70 %
BASE DEFICIT: 0.7 mmol/L (ref 0.0–2.0)
BICARBONATE: 24.2 mmol/L (ref 22.0–26.0)
CARBOHYHEMOGLOBIN: 1.8 % — ABNORMAL HIGH (ref 0.0–1.5)
CO2T: 35 MM HG (ref 33.1–43.1)
HEMATOCRIT: 22 % — ABNORMAL LOW (ref 33.5–45.2)
HEMOGLOBIN: 7.4 g/dL — ABNORMAL LOW (ref 12.0–16.0)
MET-HEMOGLOBIN: 0.8 % (ref 0.0–3.0)
O2HB: 80 % (ref 40.0–70.0)
PCO2: 40 mm Hg — ABNORMAL LOW (ref 41.0–51.0)
PH: 7.39 (ref 7.310–7.410)
PHT: 7.43 (ref 7.350–7.450)
PO2: 43 mm Hg (ref 35–50)
PO2T: 35 mm Hg — CL (ref 72–100)
TEMPERATURE, COMP: 34 C (ref 15.0–40.0)

## 2011-03-31 LAB — PRODUCT: ANTITHROMBIN III

## 2011-03-31 LAB — CBC
HCT: 23.5 % — ABNORMAL LOW (ref 33.5–45.2)
HGB: 7.8 g/dL — ABNORMAL LOW (ref 11.5–15.2)
MCH: 29.6 pg (ref 27.4–33.0)
MCHC: 33 g/dL (ref 31.6–35.5)
MCV: 89.8 fL (ref 82.0–99.0)
MPV: 6.8 FL — ABNORMAL LOW (ref 7.4–10.4)
PLATELET COUNT: 257 THOU/uL (ref 140–450)
RBC: 2.62 MIL/uL — ABNORMAL LOW (ref 3.84–5.04)
RDW: 12.6 % (ref 10.2–14.0)
WBC: 17.8 THOU/uL — ABNORMAL HIGH (ref 3.5–11.0)

## 2011-03-31 LAB — PERFORM POC WHOLE BLOOD GLUCOSE
GLUCOSE, POINT OF CARE: 124 mg/dL — ABNORMAL HIGH (ref 70–105)
GLUCOSE, POINT OF CARE: 91 mg/dL (ref 70–105)
GLUCOSE, POINT OF CARE: 120 mg/dL — ABNORMAL HIGH (ref 70–105)
GLUCOSE, POINT OF CARE: 177 mg/dL — ABNORMAL HIGH (ref 70–105)
GLUCOSE, POINT OF CARE: 156 mg/dL — ABNORMAL HIGH (ref 70–105)
GLUCOSE, POINT OF CARE: 117 mg/dL — ABNORMAL HIGH (ref 70–105)
GLUCOSE, POINT OF CARE: 106 mg/dL — ABNORMAL HIGH (ref 70–105)
GLUCOSE, POINT OF CARE: 120 mg/dL — ABNORMAL HIGH (ref 70–105)
GLUCOSE, POINT OF CARE: 110 mg/dL — ABNORMAL HIGH (ref 70–105)
GLUCOSE, POINT OF CARE: 134 mg/dL — ABNORMAL HIGH (ref 70–105)
GLUCOSE, POINT OF CARE: 170 mg/dL — ABNORMAL HIGH (ref 70–105)

## 2011-03-31 LAB — ARTERIAL BLOOD GAS/K/CA (TEMP COMP) - INACTIVE: %FIO2: 70 % (ref 21–100)

## 2011-03-31 LAB — VENOUS BLOOD GAS/CO-OX (TEMP COMP) - INACTIVE

## 2011-03-31 LAB — VENOUS BLOOD GAS/LACTATE/CO-OX/LYTES (NA/K/CA/CL/GLUC) (TEMP COMP) - ORS ONLY: IONIZED CALCIUM: 1.21 mmol/L — ABNORMAL LOW (ref 1.30–1.46)

## 2011-03-31 LAB — POTASSIUM: POTASSIUM: 3.9 mmol/L (ref 3.5–5.1)

## 2011-03-31 SURGERY — BYPASS GRAFT CORONARY ARTERY
Anesthesia: General | Site: Leg | Laterality: Right | Wound class: Clean Wound: Uninfected operative wounds in which no inflammation occurred

## 2011-03-31 MED ORDER — SODIUM CHLORIDE 0.9 % (FLUSH) INJECTION SYRINGE
2.00 mL | INJECTION | INTRAMUSCULAR | Status: DC | PRN
Start: 2011-03-31 — End: 2011-04-06

## 2011-03-31 MED ORDER — FENTANYL (PF) 50 MCG/ML INJECTION SOLUTION
INTRAMUSCULAR | Status: AC
Start: 2011-03-31 — End: 2011-03-31
  Filled 2011-03-31: qty 10

## 2011-03-31 MED ORDER — POTASSIUM CHLORIDE 20 MEQ/50 ML IN STERILE WATER INTRAVENOUS PIGGYBACK
20.00 meq | INJECTION | INTRAVENOUS | Status: DC | PRN
Start: 2011-03-31 — End: 2011-04-02

## 2011-03-31 MED ORDER — CEFAZOLIN 10 GRAM SOLUTION FOR INJECTION
2.00 g | Freq: Three times a day (TID) | INTRAMUSCULAR | Status: AC
Start: 2011-03-31 — End: 2011-04-02
  Administered 2011-03-31: 2 g via INTRAVENOUS
  Administered 2011-03-31: 0 g via INTRAVENOUS
  Administered 2011-04-01 – 2011-04-02 (×5): 2 g via INTRAVENOUS
  Filled 2011-03-31 (×7): qty 20

## 2011-03-31 MED ORDER — PROTAMINE 10 MG/ML INTRAVENOUS SOLUTION
INTRAVENOUS | Status: AC
Start: 2011-03-31 — End: 2011-03-31
  Filled 2011-03-31: qty 5

## 2011-03-31 MED ORDER — ROCURONIUM 10 MG/ML INTRAVENOUS SOLUTION
INTRAVENOUS | Status: AC
Start: 2011-03-31 — End: 2011-03-31
  Filled 2011-03-31: qty 25

## 2011-03-31 MED ORDER — PROTAMINE 10 MG/ML INTRAVENOUS SOLUTION
INTRAVENOUS | Status: AC
Start: 2011-03-31 — End: 2011-03-31
  Filled 2011-03-31: qty 20

## 2011-03-31 MED ORDER — DEXTROSE 5 % IN WATER (D5W) INTRAVENOUS SOLUTION
INTRAVENOUS | Status: DC
Start: 2011-03-31 — End: 2011-04-03

## 2011-03-31 MED ORDER — POTASSIUM CHLORIDE 20 MEQ/50 ML IN STERILE WATER INTRAVENOUS PIGGYBACK
20.00 meq | INJECTION | INTRAVENOUS | Status: DC | PRN
Start: 2011-03-31 — End: 2011-04-02
  Administered 2011-04-01: 20 meq via INTRAVENOUS

## 2011-03-31 MED ORDER — NITROGLYCERIN 50 MG/250 ML (200 MCG/ML) IN 5 % DEXTROSE INTRAVENOUS
33.00 ug/min | INTRAVENOUS | Status: DC
Start: 2011-03-31 — End: 2011-03-31

## 2011-03-31 MED ORDER — INSULIN REGULAR HUMAN 100 UNIT/ML INJ FOR MIXTURES -RX ADMIN UNIT ROUNDS TO NEAREST 0.01 ML
2.00 [IU]/h | INTRAMUSCULAR | Status: DC
Start: 2011-03-31 — End: 2011-04-03
  Administered 2011-03-31: 6 [IU]/h via INTRAVENOUS
  Administered 2011-03-31: 7 [IU]/h via INTRAVENOUS
  Administered 2011-03-31: 6 [IU]/h via INTRAVENOUS
  Administered 2011-04-01: 5 [IU]/h via INTRAVENOUS
  Administered 2011-04-01: 2.5 [IU]/h via INTRAVENOUS
  Administered 2011-04-01: 2.25 [IU]/h via INTRAVENOUS
  Administered 2011-04-01: 3.25 [IU]/h via INTRAVENOUS
  Administered 2011-04-01 (×2): 0 [IU]/h via INTRAVENOUS
  Administered 2011-04-01: 3.5 [IU]/h via INTRAVENOUS
  Administered 2011-04-01: 0 [IU]/h via INTRAVENOUS
  Administered 2011-04-01: 10 [IU]/h via INTRAVENOUS
  Administered 2011-04-01: 8 [IU]/h via INTRAVENOUS
  Administered 2011-04-01: 1.5 [IU]/h via INTRAVENOUS
  Administered 2011-04-01: 4.5 [IU]/h via INTRAVENOUS
  Administered 2011-04-02 (×2): 0 [IU]/h via INTRAVENOUS
  Administered 2011-04-02: 3 [IU]/h via INTRAVENOUS
  Administered 2011-04-02: 2 [IU]/h via INTRAVENOUS
  Administered 2011-04-02: 1 [IU]/h via INTRAVENOUS
  Filled 2011-03-31: qty 2.5

## 2011-03-31 MED ORDER — POTASSIUM CHLORIDE 20 MEQ/50 ML IN STERILE WATER INTRAVENOUS PIGGYBACK
20.00 meq | INJECTION | Freq: Once | INTRAVENOUS | Status: AC | PRN
Start: 2011-03-31 — End: 2011-03-31
  Administered 2011-03-31: 20 meq via INTRAVENOUS
  Filled 2011-03-31: qty 50

## 2011-03-31 MED ORDER — GLYCOPYRROLATE 0.2 MG/ML INJECTION SOLUTION
INTRAMUSCULAR | Status: AC
Start: 2011-03-31 — End: 2011-03-31
  Filled 2011-03-31: qty 1

## 2011-03-31 MED ORDER — SODIUM CHLORIDE 0.9 % (FLUSH) INJECTION SYRINGE
2.00 mL | INJECTION | Freq: Three times a day (TID) | INTRAMUSCULAR | Status: DC
Start: 2011-03-31 — End: 2011-04-06
  Administered 2011-03-31 – 2011-04-03 (×8): 2 mL
  Administered 2011-04-03: 10 mL
  Administered 2011-04-03: 2 mL
  Administered 2011-04-04: 0 mL
  Administered 2011-04-04 – 2011-04-06 (×6): 2 mL

## 2011-03-31 MED ORDER — MIDAZOLAM 1 MG/ML INJECTION SOLUTION
INTRAMUSCULAR | Status: AC
Start: 2011-03-31 — End: 2011-04-01
  Filled 2011-03-31: qty 2

## 2011-03-31 MED ORDER — HEPARIN (PORCINE) 1,000 UNIT/ML INJECTION SOLUTION
INTRAMUSCULAR | Status: AC
Start: 2011-03-31 — End: 2011-03-31
  Filled 2011-03-31: qty 20

## 2011-03-31 MED ORDER — INSULIN U-100 REGULAR HUMAN 100 UNIT/ML INJECTION SOLUTION
INTRAMUSCULAR | Status: AC
Start: 2011-03-31 — End: 2011-03-31
  Filled 2011-03-31: qty 3

## 2011-03-31 MED ORDER — HETASTARCH 6 % IN 0.9 % SODIUM CHLORIDE INTRAVENOUS SOLUTION
500.0000 mL | Freq: Once | INTRAVENOUS | Status: AC
Start: 2011-03-31 — End: 2011-03-31
  Administered 2011-03-31: 500 mL via INTRAVENOUS

## 2011-03-31 MED ORDER — POTASSIUM CHLORIDE 10 MEQ/50 ML IN STERILE WATER INTRAVENOUS PIGGYBACK
10.00 meq | INJECTION | Freq: Once | INTRAVENOUS | Status: AC | PRN
Start: 2011-03-31 — End: 2011-03-31
  Administered 2011-03-31: 10 meq via INTRAVENOUS
  Filled 2011-03-31: qty 50

## 2011-03-31 MED ORDER — SODIUM CHLORIDE 0.9 % INTRAVENOUS SOLUTION
1000.00 mg | Freq: Once | INTRAVENOUS | Status: DC | PRN
Start: 2011-03-31 — End: 2011-04-01

## 2011-03-31 MED ORDER — DOBUTAMINE 1,000 MG/250 ML(4,000 MCG/ML) IN 5 % DEXTROSE IV
2.5000 ug/kg/min | INTRAVENOUS | Status: DC
Start: 2011-03-31 — End: 2011-04-02
  Administered 2011-03-31: 0 ug/kg/min via INTRAVENOUS
  Administered 2011-03-31: 2.5 ug/kg/min via INTRAVENOUS
  Administered 2011-03-31: 3 ug/kg/min via INTRAVENOUS
  Administered 2011-03-31: 0 ug/kg/min via INTRAVENOUS
  Administered 2011-03-31: 1.25 ug/kg/min via INTRAVENOUS
  Administered 2011-03-31: 2.5 ug/kg/min via INTRAVENOUS
  Administered 2011-03-31: 0 ug/kg/min via INTRAVENOUS
  Administered 2011-04-01: 5 ug/kg/min via INTRAVENOUS
  Administered 2011-04-01: 3 ug/kg/min via INTRAVENOUS
  Administered 2011-04-01: 2.5 ug/kg/min via INTRAVENOUS
  Administered 2011-04-01: 1.25 ug/kg/min via INTRAVENOUS
  Administered 2011-04-01: 3 ug/kg/min via INTRAVENOUS
  Administered 2011-04-02 (×2): 1.5 ug/kg/min via INTRAVENOUS

## 2011-03-31 MED ORDER — MIDAZOLAM 1 MG/ML INJECTION SOLUTION
INTRAMUSCULAR | Status: AC
Start: 2011-03-31 — End: 2011-03-31
  Filled 2011-03-31: qty 2

## 2011-03-31 MED ORDER — MORPHINE 4 MG/ML INJECTION SYRINGE
3.00 mg | INJECTION | INTRAMUSCULAR | Status: DC | PRN
Start: 2011-03-31 — End: 2011-04-02

## 2011-03-31 MED ORDER — VANCOMYCIN 1,000 MG INTRAVENOUS INJECTION
2.00 g | Freq: Once | INTRAVENOUS | Status: DC | PRN
Start: 2011-03-31 — End: 2011-03-31
  Administered 2011-03-31: 2 g via TOPICAL
  Filled 2011-03-31 (×2): qty 20

## 2011-03-31 MED ORDER — MORPHINE 2 MG/ML INJECTION SYRINGE
1.00 mg | INJECTION | INTRAMUSCULAR | Status: DC | PRN
Start: 2011-03-31 — End: 2011-04-02
  Administered 2011-04-01 (×5): 2 mg via INTRAVENOUS
  Filled 2011-03-31 (×6): qty 1

## 2011-03-31 MED ORDER — ESMOLOL 100 MG/10 ML (10 MG/ML) INTRAVENOUS SOLUTION
INTRAVENOUS | Status: AC
Start: 2011-03-31 — End: 2011-03-31
  Filled 2011-03-31: qty 1

## 2011-03-31 MED ORDER — ATROPINE 0.4 MG/ML INJECTION SOLUTION
INTRAMUSCULAR | Status: AC
Start: 2011-03-31 — End: 2011-03-31
  Filled 2011-03-31: qty 1

## 2011-03-31 MED ORDER — INSULIN REGULAR HUMAN 100 UNIT/ML INJECTION SSIP
2.00 [IU] | INJECTION | Freq: Four times a day (QID) | SUBCUTANEOUS | Status: DC | PRN
Start: 2011-03-31 — End: 2011-04-02

## 2011-03-31 MED ORDER — FUROSEMIDE 10 MG/ML INJECTION SOLUTION
20.00 mg | INTRAMUSCULAR | Status: DC | PRN
Start: 2011-03-31 — End: 2011-04-02
  Administered 2011-04-01: 20 mg via INTRAVENOUS
  Filled 2011-03-31: qty 2

## 2011-03-31 MED ORDER — FENTANYL (PF) 50 MCG/ML INJECTION SOLUTION
INTRAMUSCULAR | Status: AC
Start: 2011-03-31 — End: 2011-03-31
  Filled 2011-03-31: qty 2

## 2011-03-31 MED ORDER — INSULIN GLARGINE (U-100) 100 UNIT/ML SUBCUTANEOUS SOLUTION
10.0000 [IU] | Freq: Every evening | SUBCUTANEOUS | Status: DC
Start: 2011-03-31 — End: 2011-04-02
  Administered 2011-03-31 – 2011-04-01 (×2): 10 [IU] via SUBCUTANEOUS
  Filled 2011-03-31 (×3): qty 10

## 2011-03-31 MED ORDER — LACTATED RINGERS INTRAVENOUS SOLUTION
INTRAVENOUS | Status: DC
Start: 2011-03-31 — End: 2011-03-31
  Administered 2011-03-31: 0 via INTRAVENOUS

## 2011-03-31 MED ORDER — ROCURONIUM 10 MG/ML INTRAVENOUS SOLUTION
INTRAVENOUS | Status: AC
Start: 2011-03-31 — End: 2011-03-31
  Filled 2011-03-31: qty 10

## 2011-03-31 MED ORDER — HEPARIN (PORCINE) 5,000 UNIT/ML INJECTION SOLUTION
5000.00 [IU] | Freq: Three times a day (TID) | INTRAMUSCULAR | Status: DC
Start: 2011-03-31 — End: 2011-04-06
  Administered 2011-03-31 – 2011-04-06 (×17): 5000 [IU] via SUBCUTANEOUS
  Filled 2011-03-31 (×20): qty 1

## 2011-03-31 MED ORDER — DEXTROSE 50 % IN WATER (D50W) INTRAVENOUS SYRINGE
12.50 g | INJECTION | INTRAVENOUS | Status: DC | PRN
Start: 2011-03-31 — End: 2011-04-03

## 2011-03-31 MED ORDER — MIDAZOLAM 1 MG/ML INJECTION SOLUTION
2.0000 mg | Freq: Once | INTRAMUSCULAR | Status: AC
Start: 2011-03-31 — End: 2011-03-31
  Administered 2011-03-31: 2 mg via INTRAVENOUS

## 2011-03-31 MED ORDER — INSULIN U-100 REGULAR HUMAN 100 UNIT/ML INJECTION SOLUTION
4.00 [IU] | INTRAMUSCULAR | Status: DC | PRN
Start: 2011-03-31 — End: 2011-04-03
  Administered 2011-03-31 – 2011-04-01 (×2): 4 [IU] via INTRAVENOUS
  Administered 2011-04-01: 8 [IU] via INTRAVENOUS
  Administered 2011-04-01: 4 [IU] via INTRAVENOUS
  Filled 2011-03-31: qty 3

## 2011-03-31 SURGICAL SUPPLY — 88 items
ADAPTER TUBING 1 2 MALE LL ART (Connecting Tubes/Misc) ×3 IMPLANT
BAG BLOOD TRANSFER 1BBTCD456A4 CS/30 (MISCELLANEOUS PT CARE ITEMS) IMPLANT
BLADE OPTH BLU MN BLD STR GRINDLESS SHARP ALL ARND 2 BVL (SURGICAL CUTTING SUPPLIES) ×3 IMPLANT
BLADE SAW 35X10MM SS THK.6MM HALL STERNUM STRL (SURGICAL CUTTING SUPPLIES) IMPLANT
BOOT SUT STRL LF  DISP YW (WOUND CARE SUPPLY) ×3 IMPLANT
CANNULA 21FR EZ GLD 14IN AUTODILATE TIP SUT RING VENT CONN 3/8IN STR AORTA PERF (VASCULAR) ×3 IMPLANT
CANNULA 22FR DLP 14IN METAL 1 STG KINK RST WRWND BODY ML PRT TIP 3/8IN RGT ANG VENOUS PERF (CANNULA) ×3 IMPLANT
CANNULA 3MM DLP 2.5IN 1 WY VALVE FEMALE LL DCKBL BLUNT TIP 3MM VESSEL PERF (CANNULA) IMPLANT
CANNULA BIOACTIVE 36FR X 40CM W/RGT VENT CB66136 (VASCULAR) IMPLANT
CANNULA CARMEDA 19FR COAT CB96605019 (VASCULAR) IMPLANT
CANNULA CARMEDA RT/34FR CB67534 (VASCULAR) IMPLANT
CANNULA DLP BLUNT TIP FEMALE LL VESSEL PERF CLR (CANNULA) IMPLANT
CANNULA VENOUS 9.6/12.3 X 37_TF293702 CS/10 (CANNULA) ×3 IMPLANT
CART HEPCON 5L HEP DS RSPN CAR_T SYRG BLUNT TIP NEEDLE (TEST) ×3 IMPLANT
CART HEPCON SILVER 2-3.5MG/KG_BLUNT HEP 4 CHNL SYRG NEEDLE (TEST) ×12 IMPLANT
CART HMS + HEP ASY 2 CHNL 9 SYRG HI RNG ACT CLOT TIME ANALYZER DISP (TEST) ×15 IMPLANT
CART HMS + RD .9- MG/KG 4 CHNL 9 SYRG HEP ASY BLUNT NEEDLE ANALYZER DISP (TEST) ×3 IMPLANT
CARTRIDGE TAN SILVER CONTROL_10/BX 30602 (TEST) IMPLANT
CATH ULTRAVERSE 035 GEOALIGN 5MM 100MM 75CM OTW RADOPQ BAL (CUSTOM TRAYS & PACK) ×3 IMPLANT
CATH ULTRAVERSE 035 GEOALIGN 7MM 20MM 130CM OTW RADOPQ BAL (PACK) IMPLANT
CONN 3:1 SIL CARDIAC DRAIN 4 CHNL RADOPQ SLD COR CNTR BLAK 3/8.5MMX3/16IN STRL LF  DISP WHT (ADAP) ×3 IMPLANT
CONN PERF EQL .5IN STR STRL (Connecting Tubes/Misc) ×3 IMPLANT
CONN PERF LL EQL 3/8IN STR STRL (Connecting Tubes/Misc) ×3 IMPLANT
CONNECTOR CARMEDA 3/8X3/8X3/8 10/PK CB4628 (Connecting Tubes/Misc) IMPLANT
CONNECTOR TUBING 1/4 X 18IN DHFO6 (TUBE/TUBING & SUCTION SUPPLIES) IMPLANT
CONTROL HEP DEIONIZE H2O HMS +_10 VIAL ASY RD YW COAG (TEST) IMPLANT
CONTROL HPCN CLTRC HI RNG PK A_CT+ COAG (TEST) IMPLANT
CONV USE ITEM 156524 - ADHESIVE TISSUE EXOFIN 1.0ML_PREMIERPRO EXOFIN (SEALANTS) ×3 IMPLANT
COVER CAM 57MM LIGHT HNDL BLU STRL (EQUIPMENT MINOR) ×3 IMPLANT
COVER CASSETTE UNIV XRY 40X20IN (DRAPE/PACKS/SHEETS/OR TOWEL) ×3 IMPLANT
DRAIN INCS 24FR JP SIL CHNL STRL LF  RND FLUTE DISP WHT (Drains/Resovoirs) ×9 IMPLANT
DRAIN INCS ATR OAS PLASTIC CHST THOR TUBE DRY SUCT COLLECT CHAMBER STRL LF  DISP ATS BAG (Drains/Resovoirs) ×3 IMPLANT
DRAPE SLSH WRMR RND BSIN 66X44IN STRL EQP (EQUIPMENT MINOR) ×3 IMPLANT
DRESS 8X4IN PRMPR ACRL PLSTR HI ABS PAD NWVN SFT BRTHBL CVR WOUND STRL LF (WOUND CARE SUPPLY) ×3 IMPLANT
DRESS WOUND PRMPR NWVN LF  STRL DISP (WOUND CARE SUPPLY) ×3 IMPLANT
DRESSING PRIMAPORE 4 X 8_66000319 20EA/BX (WOUND CARE/ENTEROSTOMAL SUPPLY) ×1
DUP USE ITEM 161800 - RESERVOIR AUTOTRANSFUSION 40 U_M COLLECTION FILTER (PERFUSION/HEART SUPPLIES) ×3 IMPLANT
ELECTRODE ESURG BLADE 2.75IN 3/32IN EDGE STRL .2IN DISP INSL STD SHAFT HEX LOCK LF (CAUTERY SUPPLIES) ×3 IMPLANT
ELECTRODE ESURG BLADE 6.5IN 3/32IN EDGE STRL .2IN DISP INSL STD SHAFT XTD LF (CAUTERY SUPPLIES) ×3 IMPLANT
ELECTRODE ESURG BLADE 6.5IN 3/_32IN EDGE STRL .2IN DISP INSL (CAUTERY SUPPLIES) ×1
EXCHANGER CARDIO HEAT CARMEDA COATED 4/PK CBXP41 (MISCELLANEOUS PT CARE ITEMS) IMPLANT
FILTER BLOOD PALL LEUKOGUARD 6/CS LG6B (BLOOD) IMPLANT
FILTER CARDIOPLEGIA CPS02 (PERFUSION/HEART SUPPLIES) ×3 IMPLANT
GOWN SURG XL L3 NONREINFORCE HKLP CLSR SET IN SLEEVE STRL LF  DISP BLU SIRUS SMS 47IN (DGOW) ×3 IMPLANT
HEMOCONCR .07SQ MR HPH MINI LOW PRM VOL GNTL ULFLTR RATE PERF PLSLFN 15CM 2.5CM 14ML STRL (PERFUSION/HEART SUPPLIES) IMPLANT
HEMOCONCR .07SQ MR HPH MINI LO_W PRM VOL GNTL ULFLTR RT PERF (PERFUSION/HEART SUPPLIES)
KIT RM TURNOVER CLEANOP CSTM INFCT CONTROL (KITS & TRAYS (DISPOSABLE)) ×3
KIT RM TURNOVER CLEANOP CUSTOM INFCT CONTROL (KITS & TRAYS (DISPOSABLE)) ×3 IMPLANT
LINE ISOLATION EA 1K62R1 PK/10 (IV TUBING & ACCESSORIES) IMPLANT
MEMBRANE OXYGENATOR CLOSED SYS 050532 2/CS (OXY) IMPLANT
MIDICARD 5357 6/CS (MISCELLANEOUS PT CARE ITEMS) IMPLANT
OXYGENATOR PED PRF CS/4 CB3381 (OXY) IMPLANT
PACK ACCESSORY PERF_020843701 10/CS (PERFUSION/HEART SUPPLIES) ×3 IMPLANT
PACK ECLS PEDS CARMEDA CB4909R10 (PACK) IMPLANT
PACK HEMOCONCENTRATOR CS/5 020147801 (PACK) IMPLANT
PACK INSPIRE 046006600_046006600 (PERFUSION/HEART SUPPLIES) ×3 IMPLANT
PACK SURG PROC LF  STRL .25X.25IN PED (PERFUSION/HEART SUPPLIES) IMPLANT
PACK SURG PROC LF  STRL 3/8X3/8IN PED (PERFUSION/HEART SUPPLIES) IMPLANT
PAD MNT ADH LEVEL (TEMP) ×3 IMPLANT
PLEDGET CV WHT 7X3.5MM THK1.5MM PTFE SFT (SUTURE/WOUND CLOSURE) IMPLANT
RESERVOIR CARDIOTOMY CARMEDA CB1351 6EA/PK (MISCELLANEOUS PT CARE ITEMS) IMPLANT
SENSOR SHUNT CDI HEP 1.2ML SYS 500 STRL DISP (SURGICAL INSTRUMENTS) ×6 IMPLANT
SET AUTOTRANS WASH CHAMBER TUB_E AT1 CATS (IV TUBING & ACCESSORIES) ×3 IMPLANT
SET AUTOTRANSFUSION TUBING ATS_SUCTION LINE (Cautery Accessories) ×3 IMPLANT
SET BLOOD PUMP 32FR 05051032HA (TUBE/TUBING & SUCTION SUPPLIES) IMPLANT
SET CLAMP FOGARTY HG SJ 61MM ATRAUMA OCCL HIFLO INSRT STRL LF  DISP (SURGICAL INSTRUMENTS) ×3 IMPLANT
SET TBL LINE CARDIOPLGA (IRR) IMPLANT
SLEEVE SCD EXPRESS KNEE LG 5/CS 9789 (EQUIPMENT MINOR) ×3 IMPLANT
SNARE 47IN .98IN 1SNR 1 LOOP G LD PLT TUNG SUPERELASTIC FLXB (DIAGNOSTIC) IMPLANT
SOL ANFG DFGR ISOPRPNL PAD OVAL BTL NABRSV ADH STRL LF  DISP (KITS & TRAYS (DISPOSABLE)) ×3 IMPLANT
SOL IV VIAFLEX PLAS-LYTE A PH 7.4 TY 1 1000ML LF (SOLUTIONS) IMPLANT
SPIKE BAG TRANSFER 20EA/PK BT945 (MISCELLANEOUS PT CARE ITEMS) IMPLANT
SPONGE GAUZE 4X4IN XD STRL_7317 VISTEC (WOUND CARE/ENTEROSTOMAL SUPPLY) ×1
SPONGE SURG 4X4IN 16 PLY RADOPQ BAND VISTEC STRL LF  BLU WHT (WOUND CARE SUPPLY) ×3 IMPLANT
STOPCOCK IV MEDEX HFL ULTRA MEDFUSION 3W SWVL MALE LL SM BORE STRL LF  DISP (Connecting Tubes/Misc) ×6 IMPLANT
STOPCOCK PERF 1 WY UNDIR PURGE LINE STRL (PERFUSION/HEART SUPPLIES) IMPLANT
SUTURE 6-0 C-1 PROLENE VISI-BLK 30IN BLU 2 ARM MONOF 4 STRN NONAB (SUTURE/WOUND CLOSURE) IMPLANT
SUTURE 6-0 C-1 PROLENE VISI-BL_K 30IN BLU 2 ARM MONOF 4 STRN (SUTURE/WOUND CLOSURE)
SUTURE M8766 PROLENE 12/BX 7-0 BV175-7 MULTI (SUTURE/WOUND CLOSURE) IMPLANT
SUTURE POLYPROP 4-0 SURGIPRO2 36IN MONOF NONAB (SUTURE/WOUND CLOSURE) IMPLANT
SYRINGE LL 50ML LF  STRL GRAD N-PYRG DEHP-FR PVC FREE MED DISP CLR (NEEDLES & SYRINGE SUPPLIES) ×3 IMPLANT
TUBING CARMEDA 6FT 1/2 X 3/32 5/CS CB2995 (TUBE/TUBING & SUCTION SUPPLIES) IMPLANT
TUBING PERF 1/2X3/32X6FT_020466101 (PERFUSION/HEART SUPPLIES) IMPLANT
TUBING PERF PUMP .25INX1/16IN STD 72IN SEG STRL (PERFUSION/HEART SUPPLIES) IMPLANT
TUBING PERF PUMP 3/8INX3/32IN 72IN STD DRMTR 68 SHOR STRL (PERFUSION/HEART SUPPLIES) IMPLANT
TUBING PRESS MONITOR 72IN TRUWAVE MALE TO FEMALE CONN TRANSDUC STRL LF  DISP (CUFF) ×3 IMPLANT
VSL HRVT VSVW 7XB CANN BISSECT OR BTT SYRG SYSTEM 5CC 30CC (KITS & TRAYS (DISPOSABLE)) ×3 IMPLANT
WIRE 6494F TEMP PACE 6494F 12EA/PK (Electrical Supplies) ×6 IMPLANT

## 2011-03-31 NOTE — H&P (Addendum)
[facility]                                                     H&P Update Form    Tavaria, Mackins, 45 y.o. female  Date of Admission:  03/31/2011  Date of Birth:  1966/03/20    03/31/2011    STOP: IF H&P IS GREATER THAN 30 DAYS FROM SURGICAL DAY COMPLETE NEW H&P IS REQUIRED.    Outpatient Pre-Surgical H & P updated the day of the procedure.  1.  H&P completed within 30 days of surgical procedure was performed by Dr. Francesco Runner on 03/24/2011 and has been reviewed, the patient has been examined, and no change has occured in the patients condition since the H&P was completed.       Change in medications: No      Last Menstrual Period: Not applicable      Comments:  Pt took Lopressor 12.5 mg, last evening at 800PM    2.  Patient continues to be appropiate candidate for planned surgical procedure. YES      Versie Starks Procopio, PA-C    I personally saw and examined the patient. See Midlevel's note for additional details.    Ellery Plunk, MD 03/31/2011, 2:07 PM

## 2011-03-31 NOTE — Progress Notes (Signed)
[facility]                                                     BRIEF OPERATIVE NOTE    Patient Name: Monique, [name] Number: [id number]  Date of Service: 03/31/2011   Date of Birth: [birth year]        Pre-Operative Diagnosis: CAD     Post-Operative Diagnosis: Same    Procedure(s)/Description:  CABG X 2 LIMA>LAD, SVG>OM2; Left endoscopic vein harvest    Findings: XClamp=71min, CPB=68     Attending Surgeon: Francesco Runner MD    Assistant(s): Daiva Huge No qualified resident physician available to assist    Anesthesia Type: General endotracheal anesthesia    Estimated Blood Loss:  Minimal     Blood Given: 2 units PRBC          Fluids Given: +5 ml    Complications:  None    Tubes: Chest Tube    Drains: None           Specimens/ Cultures: None           Implants: None           Disposition: ICU - intubated and hemodynamically stable.           Condition: stable      Ellery Plunk, MD 03/31/2011, 5:13 PM

## 2011-03-31 NOTE — OR PreOp (Signed)
Spouse, Harvie Heck, with pt as well as other family members.

## 2011-03-31 NOTE — Progress Notes (Signed)
[facility]                                                     H&P Update Form    Monique, [name], 45 y.o. female  Date of Admission:  03/31/2011  Date of Birth:  April 03, 1966    03/31/2011    STOP: IF H&P IS GREATER THAN 30 DAYS FROM SURGICAL DAY COMPLETE NEW H&P IS REQUIRED.    Outpatient Pre-Surgical H & P updated the day of the procedure.  1.  H&P completed within 30 days of surgical procedure was performed by Dr. Francesco Runner  and has been reviewed, the patient has been examined, and no change has occured in the patients condition since the H&P was completed.       Change in medications: No      Last Menstrual Period: Post-Menopausal      Comments:     2.  Patient continues to be appropiate candidate for planned surgical procedure. YES      Ellery Plunk, MD

## 2011-03-31 NOTE — Care Management Notes (Signed)
Care Coordinator/Social Work Plan  [place]  Patient Name: Monique [name] MRN: [id number] Acct Number: [id number]  DOB: May 20, 1966 Age: 45  **Admission Information**  Patient Type: INPATIENT  Admit Date: 03/31/2011 Admit Time: 10:17  Admit Reason: CAD  Admitting Phys: Ellery Plunk  Attending Phys: Ellery Plunk  Unit: 5INTOP Bed: 10:17  160. CMS Important Message / Detailed Notice  Created by : Chryl Heck Date/Time 2011-03-31 13:32:57.803  Medicare Important Message/Detailed Notice  Admission- CMS-Important Message from Medicare About Your Rights (CMS-1405) IMM was delivered to the patient. A copy was  provided to the patient. (Date / Time) 03/31/2011

## 2011-03-31 NOTE — Nurses Notes (Signed)
 Adm of 45 yo W/F to CTU #3 for service of Quintin S/P CABG x 2. Under effects of anes with ETT in place being manually vented by MD. Attached to vent by RT, monitoring devices by RN and CA applied bilat soft wrist restraints to prevent self injury during the post anes. Period. OGT inserted by RN with good air bolus, verified by PCXR. Chest tubes and foley patent and draining. Bair hugger applied for temp of 35.8c. Invasive lines with good wave patterns and hemodynamics. Will titrate drips and adm fluids to keep MAP 65-85, CI >202, PAD 15-18. Post op status stable at this time. See flowsheet for complete assessment. SABRA

## 2011-04-01 ENCOUNTER — Inpatient Hospital Stay (HOSPITAL_COMMUNITY): Payer: Medicare Other

## 2011-04-01 LAB — ARTERIAL BLOOD GAS/K/CA/CO-OX
%FIO2: 50 % (ref 21–100)
BASE DEFICIT: 0.8 mmol/L (ref 0.0–3.0)
BASE EXCESS: 1.4 mmol/L (ref 0.0–3.0)
BICARBONATE: 25.9 mmol/L (ref 18.0–29.0)
BICARBONATE: 24.1 mmol/L (ref 18.0–29.0)
CARBOXYHEMOGLOBIN: 0.7 % (ref 0.0–2.5)
CARBOXYHEMOGLOBIN: 1.8 % (ref 0.0–2.5)
HEMATOCRIT: 32 % — ABNORMAL LOW (ref 33.5–45.2)
HEMATOCRIT: 32 % — ABNORMAL LOW (ref 33.5–45.2)
HEMOGLOBIN: 10.5 g/dL — ABNORMAL LOW (ref 12.0–16.0)
HEMOGLOBIN: 10.7 g/dL — ABNORMAL LOW (ref 12.0–16.0)
IONIZED CALCIUM: 1.15 mmol/L — ABNORMAL LOW (ref 1.30–1.46)
IONIZED CALCIUM: 1.15 mmol/L — ABNORMAL LOW (ref 1.30–1.46)
MET-HEMOGLOBIN: 0.3 % (ref 0.0–3.0)
MET-HEMOGLOBIN: 1.3 % (ref 0.0–3.0)
O2CT: 13.7 — ABNORMAL LOW (ref 15.7–21.6)
O2CT: 13.3 — ABNORMAL LOW (ref 15.7–21.6)
OXYHEMOGLOBIN: 92.9 % (ref 85.0–98.0)
OXYHEMOGLOBIN: 88.6 % (ref 85.0–98.0)
PCO2: 33 mm Hg — ABNORMAL LOW (ref 33.1–43.1)
PCO2: 35 mm Hg (ref 33.1–43.1)
PH: 7.48 — ABNORMAL HIGH (ref 7.350–7.450)
PH: 7.43 (ref 7.350–7.450)
PIO2/FIO2 RATIO: 118 — ABNORMAL LOW (ref >300)
PIO2/FIO2 RATIO: 118 — ABNORMAL LOW (ref >300)
PO2: 59 mm Hg — ABNORMAL LOW (ref 72–100)
PO2: 52 mm Hg — ABNORMAL LOW (ref 72–100)
WHOLE BLOOD K+: 4.4 mmol/L (ref 3.5–5.0)
WHOLE BLOOD K+: 4.4 mmol/L (ref 3.5–5.0)

## 2011-04-01 LAB — FIBRINOGEN: FIBRINOGEN: 191 mg/dL — ABNORMAL LOW (ref 200–490)

## 2011-04-01 LAB — CBC/DIFF
BASOPHILS: 0 % (ref 0–1)
BASOS ABS: 0.034 THOU/uL (ref 0.0–0.2)
EOS ABS: 0.018 THOU/uL — ABNORMAL LOW (ref 0.1–0.3)
EOSINOPHIL: 0 % — ABNORMAL LOW (ref 1–6)
HCT: 35.1 % (ref 33.5–45.2)
HGB: 11.3 g/dL — ABNORMAL LOW (ref 11.5–15.2)
LYMPHOCYTES: 8 % — ABNORMAL LOW (ref 20–45)
LYMPHS ABS: 1.21 THOU/uL (ref 1.0–4.8)
MCH: 28.3 pg (ref 27.4–33.0)
MCHC: 32.2 g/dL (ref 31.6–35.5)
MCV: 87.8 fL (ref 82.0–99.0)
MONOCYTES: 8 % (ref 4–13)
MONOS ABS: 1.21 THOU/uL — ABNORMAL HIGH (ref 0.1–0.9)
MPV: 7.1 FL — ABNORMAL LOW (ref 7.4–10.4)
NRBC'S: 0 /100{WBCs}
PLATELET COUNT: 170 THOU/uL (ref 140–450)
PMN ABS: 13.2 THOU/uL — ABNORMAL HIGH (ref 1.5–7.7)
RBC: 4 MIL/uL (ref 3.84–5.04)
RDW: 13 % (ref 10.2–14.0)
WBC: 15.6 THOU/uL — ABNORMAL HIGH (ref 3.5–11.0)

## 2011-04-01 LAB — ARTERIAL BLOOD GAS/K/CA/CO-OX (TEMP COMP)
%FIO2: 100 % (ref 21–100)
%FIO2: 100 % (ref 21–100)
%FIO2: 50 % (ref 21–100)
%FIO2: 50 % (ref 21–100)
BASE DEFICIT: 1.2 mmol/L (ref 0.0–3.0)
BASE DEFICIT: 8.7 mmol/L — ABNORMAL HIGH (ref 0.0–3.0)
BASE DEFICIT: 6.6 mmol/L — ABNORMAL HIGH (ref 0.0–3.0)
BASE DEFICIT: 0.2 mmol/L (ref 0.0–3.0)
BICARBONATE: 18.2 mmol/L (ref 18.0–29.0)
BICARBONATE: 24 mmol/L (ref 18.0–29.0)
BICARBONATE: 19.7 mmol/L (ref 18.0–29.0)
BICARBONATE: 24.7 mmol/L (ref 18.0–29.0)
CARBOXYHEMOGLOBIN: 1.2 % (ref 0.0–2.5)
CARBOXYHEMOGLOBIN: 1.4 % (ref 0.0–2.5)
CARBOXYHEMOGLOBIN: 1.7 % (ref 0.0–2.5)
CARBOXYHEMOGLOBIN: 0.9 % (ref 0.0–2.5)
CO2T: 38 MM HG (ref 33.1–43.1)
CO2T: 44 MM HG — ABNORMAL HIGH (ref 33.1–43.1)
CO2T: 33 MM HG — ABNORMAL LOW (ref 33.1–43.1)
CO2T: 36 MM HG (ref 33.1–43.1)
HEMATOCRIT: 19 % — ABNORMAL LOW (ref 33.5–45.2)
HEMATOCRIT: 24 % — ABNORMAL LOW (ref 33.5–45.2)
HEMATOCRIT: 34 % (ref 33.5–45.2)
HEMATOCRIT: 32 % — ABNORMAL LOW (ref 33.5–45.2)
HEMOGLOBIN: 6.2 g/dL — CL (ref 12.0–16.0)
HEMOGLOBIN: 8 g/dL — ABNORMAL LOW (ref 12.0–16.0)
HEMOGLOBIN: 11.4 g/dL — ABNORMAL LOW (ref 12.0–16.0)
HEMOGLOBIN: 10.6 g/dL — ABNORMAL LOW (ref 12.0–16.0)
IONIZED CALCIUM: 1.18 mmol/L — ABNORMAL LOW (ref 1.30–1.46)
IONIZED CALCIUM: 1.23 mmol/L — ABNORMAL LOW (ref 1.30–1.46)
IONIZED CALCIUM: 1.18 mmol/L — ABNORMAL LOW (ref 1.30–1.46)
IONIZED CALCIUM: 1.2 mmol/L — ABNORMAL LOW (ref 1.30–1.46)
MET-HEMOGLOBIN: 0 % (ref 0.0–3.0)
MET-HEMOGLOBIN: 1.7 % (ref 0.0–3.0)
MET-HEMOGLOBIN: 0.5 % (ref 0.0–3.0)
MET-HEMOGLOBIN: 0.3 % (ref 0.0–3.0)
O2CT: 11.2 — ABNORMAL LOW (ref 15.7–21.6)
O2CT: 9.5 — ABNORMAL LOW (ref 15.7–21.6)
O2CT: 15.2 — ABNORMAL LOW (ref 15.7–21.6)
O2CT: 13.8 — ABNORMAL LOW (ref 15.7–21.6)
OXYHEMOGLOBIN: 96.1 % (ref 85.0–98.0)
OXYHEMOGLOBIN: 97.5 % (ref 85.0–98.0)
OXYHEMOGLOBIN: 94.5 % (ref 85.0–98.0)
OXYHEMOGLOBIN: 92.5 % (ref 85.0–98.0)
PCO2: 39 mm Hg (ref 33.1–43.1)
PCO2: 45 mm Hg — ABNORMAL HIGH (ref 33.1–43.1)
PCO2: 33 mm Hg — ABNORMAL LOW (ref 33.1–43.1)
PCO2: 36 mm Hg (ref 33.1–43.1)
PH: 7.22 — CL (ref 7.350–7.450)
PH: 7.39 (ref 7.350–7.450)
PH: 7.35 (ref 7.350–7.450)
PH: 7.43 (ref 7.350–7.450)
PHT: 7.23 — CL (ref 7.350–7.450)
PHT: 7.4 (ref 7.350–7.450)
PHT: 7.35 (ref 7.350–7.450)
PHT: 7.43 (ref 7.350–7.450)
PIO2/FIO2 RATIO: 106 — ABNORMAL LOW (ref >300)
PIO2/FIO2 RATIO: 378 (ref >300)
PIO2/FIO2 RATIO: 154 — ABNORMAL LOW (ref >300)
PIO2/FIO2 RATIO: 112 — ABNORMAL LOW (ref >300)
PO2: 106 mm Hg — ABNORMAL HIGH (ref 72–100)
PO2: 378 mm Hg (ref 72–100)
PO2: 56 mm Hg — ABNORMAL LOW (ref 72–100)
PO2T: 104 mm Hg (ref 72–100)
PO2T: 375 mm Hg (ref 72–100)
PO2T: 77 mm Hg (ref 72–100)
PO2T: 56 mm Hg — CL (ref 72–100)
TEMPERATURE, COMP: 36.5 C (ref 15.0–40.0)
TEMPERATURE, COMP: 36.6 C (ref 15.0–40.0)
TEMPERATURE, COMP: 37 C (ref 15.0–40.0)
TEMPERATURE, COMP: 37 C (ref 15.0–40.0)
WHOLE BLOOD K+: 3.8 mmol/L (ref 3.5–5.0)
WHOLE BLOOD K+: 3.8 mmol/L (ref 3.5–5.0)
WHOLE BLOOD K+: 4.9 mmol/L (ref 3.5–5.0)
WHOLE BLOOD K+: 3.9 mmol/L (ref 3.5–5.0)

## 2011-04-01 LAB — PERFORM POC WHOLE BLOOD GLUCOSE
GLUCOSE, POINT OF CARE: 105 mg/dL (ref 70–105)
GLUCOSE, POINT OF CARE: 111 mg/dL — ABNORMAL HIGH (ref 70–105)
GLUCOSE, POINT OF CARE: 114 mg/dL — ABNORMAL HIGH (ref 70–105)
GLUCOSE, POINT OF CARE: 121 mg/dL — ABNORMAL HIGH (ref 70–105)
GLUCOSE, POINT OF CARE: 123 mg/dL — ABNORMAL HIGH (ref 70–105)
GLUCOSE, POINT OF CARE: 131 mg/dL — ABNORMAL HIGH (ref 70–105)
GLUCOSE, POINT OF CARE: 133 mg/dL — ABNORMAL HIGH (ref 70–105)
GLUCOSE, POINT OF CARE: 134 mg/dL — ABNORMAL HIGH (ref 70–105)
GLUCOSE, POINT OF CARE: 138 mg/dL — ABNORMAL HIGH (ref 70–105)
GLUCOSE, POINT OF CARE: 139 mg/dL — ABNORMAL HIGH (ref 70–105)
GLUCOSE, POINT OF CARE: 142 mg/dL — ABNORMAL HIGH (ref 70–105)
GLUCOSE, POINT OF CARE: 143 mg/dL — ABNORMAL HIGH (ref 70–105)
GLUCOSE, POINT OF CARE: 147 mg/dL — ABNORMAL HIGH (ref 70–105)
GLUCOSE, POINT OF CARE: 147 mg/dL — ABNORMAL HIGH (ref 70–105)
GLUCOSE, POINT OF CARE: 148 mg/dL — ABNORMAL HIGH (ref 70–105)
GLUCOSE, POINT OF CARE: 149 mg/dL — ABNORMAL HIGH (ref 70–105)
GLUCOSE, POINT OF CARE: 167 mg/dL — ABNORMAL HIGH (ref 70–105)
GLUCOSE, POINT OF CARE: 173 mg/dL — ABNORMAL HIGH (ref 70–105)
GLUCOSE, POINT OF CARE: 176 mg/dL — ABNORMAL HIGH (ref 70–105)
GLUCOSE, POINT OF CARE: 79 mg/dL (ref 70–105)
GLUCOSE, POINT OF CARE: 88 mg/dL (ref 70–105)
GLUCOSE, POINT OF CARE: 92 mg/dL (ref 70–105)
GLUCOSE, POINT OF CARE: 93 mg/dL (ref 70–105)
GLUCOSE, POINT OF CARE: 96 mg/dL (ref 70–105)

## 2011-04-01 LAB — BASIC METABOLIC PANEL
ANION GAP: 5 mmol/L (ref 5–16)
BUN/CREAT RATIO: 15 (ref 6–22)
BUN: 11 mg/dL (ref 6–20)
CALCIUM: 7.4 mg/dL — ABNORMAL LOW (ref 8.5–10.4)
CARBON DIOXIDE: 21 mmol/L — ABNORMAL LOW (ref 22–32)
CHLORIDE: 116 mmol/L — ABNORMAL HIGH (ref 96–111)
CREATININE: 0.75 mg/dL (ref 0.49–1.10)
ESTIMATED GLOMERULAR FILTRATION RATE: >59 ml/min/1.73m2 (ref >59)
GLUCOSE,NONFAST: 125 mg/dL (ref 65–139)
POTASSIUM: 4.5 mmol/L (ref 3.5–5.1)
SODIUM: 140 mmol/L (ref 136–145)

## 2011-04-01 LAB — ARTERIAL BLOOD GAS
%FIO2: 50 % (ref 21–100)
BASE DEFICIT: 8.5 mmol/L — ABNORMAL HIGH (ref 0.0–3.0)
BICARBONATE: 18.2 mmol/L (ref 18.0–29.0)
PCO2: 42 mm Hg (ref 33.1–43.1)
PH: 7.25 — ABNORMAL LOW (ref 7.350–7.450)

## 2011-04-01 LAB — PT/INR
INR: 1.1 (ref 0.8–1.2)
PROTHROMBIN TIME: 11.7 s — ABNORMAL HIGH (ref 9.1–11.5)

## 2011-04-01 LAB — POTASSIUM: POTASSIUM: 4.3 mmol/L (ref 3.5–5.1)

## 2011-04-01 LAB — CBC
HCT: 31.5 % — ABNORMAL LOW (ref 33.5–45.2)
HGB: 10.1 g/dL — ABNORMAL LOW (ref 11.5–15.2)
MCH: 28.2 pg (ref 27.4–33.0)
MCHC: 32.2 g/dL (ref 31.6–35.5)
MCV: 87.6 fL (ref 82.0–99.0)
MPV: 6.7 FL — ABNORMAL LOW (ref 7.4–10.4)
PLATELET COUNT: 293 THOU/uL (ref 140–450)
RBC: 3.59 MIL/uL — ABNORMAL LOW (ref 3.84–5.04)
RDW: 12.8 % (ref 10.2–14.0)
WBC: 21.2 THOU/uL — ABNORMAL HIGH (ref 3.5–11.0)

## 2011-04-01 LAB — PTT (PARTIAL THROMBOPLASTIN TIME): APTT: 32.6 s — ABNORMAL HIGH (ref 22.0–32.0)

## 2011-04-01 LAB — ARTERIAL BLOOD GAS - MLS OP ONLY: PO2: 78 mmHg (ref 72–100)

## 2011-04-01 LAB — ARTERIAL BLOOD GAS/K/CA/CO-OX (TEMP COMP) - INACTIVE: PO2: 77 mmHg (ref 72–100)

## 2011-04-01 MED ORDER — ASPIRIN 81 MG CHEWABLE TABLET
81.0000 mg | CHEWABLE_TABLET | Freq: Every day | ORAL | Status: DC
Start: 2011-04-02 — End: 2011-04-06
  Administered 2011-04-02 – 2011-04-06 (×5): 81 mg via ORAL
  Filled 2011-04-01 (×5): qty 1

## 2011-04-01 MED ORDER — SODIUM CHLORIDE 0.9 % IV BOLUS
500.0000 mL | INJECTION | Freq: Once | Status: AC
Start: 2011-04-01 — End: 2011-04-01
  Administered 2011-04-01: 500 mL via INTRAVENOUS

## 2011-04-01 MED ORDER — SODIUM CHLORIDE 0.9 % IV BOLUS
250.0000 mL | INJECTION | Freq: Once | Status: AC
Start: 2011-04-02 — End: 2011-04-01
  Administered 2011-04-01: 250 mL via INTRAVENOUS

## 2011-04-01 MED ORDER — MIDAZOLAM 1 MG/ML INJECTION SOLUTION
INTRAMUSCULAR | Status: AC
Start: 2011-04-01 — End: 2011-04-01
  Administered 2011-04-01: 5 mg via INTRAVENOUS
  Filled 2011-04-01: qty 5

## 2011-04-01 MED ORDER — LACTULOSE 20 GRAM/30 ML ORAL SOLUTION
30.0000 mL | Freq: Every day | ORAL | Status: DC
Start: 2011-04-02 — End: 2011-04-02
  Administered 2011-04-02: 30 mL via ORAL
  Filled 2011-04-01: qty 30

## 2011-04-01 MED ORDER — SIMVASTATIN 20 MG TABLET
20.00 mg | ORAL_TABLET | Freq: Every evening | ORAL | Status: DC
Start: 2011-04-02 — End: 2011-04-06
  Administered 2011-04-02 – 2011-04-05 (×5): 20 mg via ORAL
  Filled 2011-04-01 (×6): qty 1

## 2011-04-01 MED ORDER — SODIUM BICARBONATE 150 MEQ IN STERILE WATER PREMIX INFUSION
INTRAVENOUS | Status: DC
Start: 2011-04-01 — End: 2011-04-01
  Administered 2011-04-01: 0 mL/h via INTRAVENOUS
  Filled 2011-04-01: qty 1150

## 2011-04-01 MED ORDER — CALCIUM GLUCONATE 100 MG/ML (10 %) INTRAVENOUS SOLUTION
1000.0000 mg | Freq: Once | INTRAVENOUS | Status: AC
Start: 2011-04-01 — End: 2011-04-01
  Administered 2011-04-01: 1000 mg via INTRAVENOUS

## 2011-04-01 MED ORDER — OXYCODONE-ACETAMINOPHEN 5 MG-325 MG TABLET
1.00 | ORAL_TABLET | ORAL | Status: DC | PRN
Start: 2011-04-01 — End: 2011-04-06
  Administered 2011-04-02 (×3): 2 via ORAL
  Administered 2011-04-02: 1 via ORAL
  Administered 2011-04-02 – 2011-04-06 (×20): 2 via ORAL
  Filled 2011-04-01 (×13): qty 2
  Filled 2011-04-01: qty 1
  Filled 2011-04-01 (×8): qty 2

## 2011-04-01 MED ORDER — CALCIUM GLUCONATE 100 MG/ML (10 %) INTRAVENOUS SOLUTION
INTRAVENOUS | Status: AC
Start: 2011-04-01 — End: 2011-04-02
  Filled 2011-04-01: qty 10

## 2011-04-01 MED ORDER — DEXMEDETOMIDINE 100 MCG/ML INTRAVENOUS SOLUTION
0.20 ug/kg/h | INTRAVENOUS | Status: DC
Start: 2011-04-01 — End: 2011-04-01
  Filled 2011-04-01: qty 0.8

## 2011-04-01 MED ORDER — AMIODARONE 200 MG TABLET
400.0000 mg | ORAL_TABLET | Freq: Three times a day (TID) | ORAL | Status: DC
Start: 2011-04-02 — End: 2011-04-02
  Filled 2011-04-01: qty 2

## 2011-04-01 MED ORDER — PHENYLEPHRINE 10 MG/ML INJECTION SOLUTION
0.5000 ug/kg/min | INTRAMUSCULAR | Status: DC
Start: 2011-04-01 — End: 2011-04-02
  Administered 2011-04-01: 1 ug/kg/min via INTRAVENOUS
  Administered 2011-04-01: 2 ug/kg/min via INTRAVENOUS
  Administered 2011-04-01: 0 ug/kg/min via INTRAVENOUS
  Filled 2011-04-01: qty 6

## 2011-04-01 MED ORDER — POTASSIUM CHLORIDE 20 MEQ/50 ML IN STERILE WATER INTRAVENOUS PIGGYBACK
INJECTION | INTRAVENOUS | Status: AC
Start: 2011-04-01 — End: 2011-04-02
  Filled 2011-04-01: qty 50

## 2011-04-01 MED ORDER — POTASSIUM CHLORIDE 10 MEQ/50 ML IN STERILE WATER INTRAVENOUS PIGGYBACK
INJECTION | INTRAVENOUS | Status: AC
Start: 2011-04-01 — End: 2011-04-02
  Filled 2011-04-01: qty 50

## 2011-04-01 MED ORDER — SENNOSIDES 8.6 MG-DOCUSATE SODIUM 50 MG TABLET
1.00 | ORAL_TABLET | Freq: Every evening | ORAL | Status: DC
Start: 2011-04-02 — End: 2011-04-06
  Administered 2011-04-02 – 2011-04-05 (×5): 1 via ORAL
  Filled 2011-04-01 (×6): qty 1

## 2011-04-01 MED ORDER — POTASSIUM CHLORIDE 10 MEQ/50 ML IN STERILE WATER INTRAVENOUS PIGGYBACK
10.0000 meq | INJECTION | Freq: Once | INTRAVENOUS | Status: AC
Start: 2011-04-01 — End: 2011-04-01
  Administered 2011-04-01: 10 meq via INTRAVENOUS

## 2011-04-01 MED ORDER — DOCUSATE SODIUM 100 MG CAPSULE
100.00 mg | ORAL_CAPSULE | Freq: Two times a day (BID) | ORAL | Status: DC
Start: 2011-04-02 — End: 2011-04-06
  Administered 2011-04-02 – 2011-04-06 (×10): 100 mg via ORAL
  Filled 2011-04-01 (×11): qty 1

## 2011-04-01 MED ORDER — SODIUM BICARBONATE 150 MEQ IN STERILE WATER PREMIX INFUSION
INTRAVENOUS | Status: DC
Start: 2011-04-01 — End: 2011-04-01
  Filled 2011-04-01: qty 1150

## 2011-04-01 MED ORDER — LIDOCAINE HCL 20 MG/ML (2 %) INJECTION SOLUTION
INTRAMUSCULAR | Status: AC
Start: 2011-04-01 — End: 2011-04-01
  Filled 2011-04-01: qty 20

## 2011-04-01 MED ORDER — SODIUM CHLORIDE 0.9 % INTRAVENOUS SOLUTION
0.20 ug/kg/h | INTRAVENOUS | Status: DC
Start: 2011-04-01 — End: 2011-04-01
  Filled 2011-04-01: qty 2

## 2011-04-01 MED ORDER — CALCIUM GLUCONATE 100 MG/ML (10 %) INTRAVENOUS SOLUTION
1000.0000 mg | Freq: Once | INTRAVENOUS | Status: AC
Start: 2011-04-01 — End: 2011-04-01
  Administered 2011-04-01: 1000 mg via INTRAVENOUS
  Filled 2011-04-01: qty 20

## 2011-04-01 MED ORDER — METOPROLOL TARTRATE 12.5 MG HALF TAB
12.50 mg | ORAL_TABLET | Freq: Two times a day (BID) | ORAL | Status: DC
Start: 2011-04-02 — End: 2011-04-02
  Filled 2011-04-01: qty 1

## 2011-04-01 MED ORDER — VASOPRESSIN 20 UNIT/ML INJECTION SOLUTION
INTRAMUSCULAR | Status: AC
Start: 2011-04-01 — End: 2011-04-02
  Filled 2011-04-01: qty 2

## 2011-04-01 MED ORDER — MIDAZOLAM 1 MG/ML INJECTION SOLUTION
INTRAMUSCULAR | Status: AC
Start: 2011-04-01 — End: 2011-04-01
  Administered 2011-04-01: 2 mg
  Filled 2011-04-01: qty 5

## 2011-04-01 MED ORDER — MIDAZOLAM 1 MG/ML INJECTION SOLUTION
5.0000 mg | Freq: Once | INTRAMUSCULAR | Status: AC
Start: 2011-04-01 — End: 2011-04-01

## 2011-04-01 MED ORDER — MIDAZOLAM 1 MG/ML INJECTION SOLUTION
2.0000 mg | Freq: Once | INTRAMUSCULAR | Status: AC
Start: 2011-04-01 — End: 2011-04-01

## 2011-04-01 MED ORDER — VASOPRESSIN 20 UNIT/ML INJECTION SOLUTION
0.0400 [IU]/min | INTRAMUSCULAR | Status: DC
Start: 2011-04-01 — End: 2011-04-02
  Administered 2011-04-01: 0.06 [IU]/min via INTRAVENOUS
  Administered 2011-04-01: 0.04 [IU]/min via INTRAVENOUS
  Administered 2011-04-01: 0 [IU]/min via INTRAVENOUS
  Administered 2011-04-01: 0.04 [IU]/min via INTRAVENOUS
  Administered 2011-04-01: 0.027 [IU]/min via INTRAVENOUS
  Administered 2011-04-01: 0.12 [IU]/min via INTRAVENOUS

## 2011-04-01 MED ORDER — CALCIUM GLUCONATE 100 MG/ML (10 %) INTRAVENOUS SOLUTION
1000.0000 mg | Freq: Once | INTRAVENOUS | Status: AC
Start: 2011-04-01 — End: 2011-04-01
  Administered 2011-04-01: 1000 mg via INTRAVENOUS
  Filled 2011-04-01: qty 10

## 2011-04-01 MED ORDER — PRENATAL VIT-IRON-FOLATE TAB WRAPPER
1.0000 | ORAL_TABLET | Freq: Every day | Status: DC
Start: 2011-04-02 — End: 2011-04-06
  Administered 2011-04-02 – 2011-04-06 (×5): 1 via ORAL
  Filled 2011-04-01 (×5): qty 1

## 2011-04-01 MED FILL — albumin, human 5 % intravenous solution: INTRAVENOUS | Qty: 500 | Status: AC

## 2011-04-01 NOTE — CDI WORKSHEET (Addendum)
DRG NLV   Working DRG 1: 236 Coronary bypass w/o cardiac cath w/o [facility]  2/1     Final MS-DRG:  236 Coronary bypass w/o cardiac cath w/o [facility]     Reason Code: 1-Final/Concurrent DRG Match - Optimal Selection    Comments:     Final Coder:  Brett Fairy      Principle Diagnosis:  CAD     Principle Procedure:    CABG X 2 LIMA>LAD, SVG>OM2; Left endoscopic vein harvest Procedure Date:    03/31/11--Monique [name]   Secondary Dx:    CAD  Pleural Effusion (Hemothorax)  DM II  HPL  HTN  Seizure  Coagulopathy  Acute Post-Op Anemia  Hx Stroke  Tobacco Use    4/26  Acute Post Op Bleeding Anemia  Uncontrolled DM II  Fluid Overload    4/27  Post-Op Hemothorax Secondary Procedures:    4/23 PRBCs    4/24 Chest Tube     Past Medical Hx:    Chole  Tonsillectomy  Cataract Removal     Comments:     Home Medications: ASA, Glucophage, Amaryl, Niaspan, Norco, Caduet, Lipitor, Norvasc, Plavix, Chantix

## 2011-04-01 NOTE — Progress Notes (Addendum)
Chest Tube Insertion Procedure Note    Indications:  Clinically significant Hemothorax    Pre-operative Diagnosis: Hemothorax    Post-operative Diagnosis: Hemothorax    Surgeon: Werner Lean, NP     Assistants: none    Anesthesia: General endotracheal anesthesia    ASA Class: 2    Procedure Details   Informed consent was obtained for the procedure, including sedation.  Risks of lung perforation, hemorrhage, arrhythmia, and adverse drug reaction were discussed.     After sterile skin prep, using standard technique, a 28 French tube was placed in the left lateral 5 rib space.    Findings:  1200 ml of serosanguinous fluid obtained    Estimated Blood Loss:  Minimal           Specimens:  None                Complications:  None; patient tolerated the procedure well.           Disposition: ICU - intubated and critically ill.           Condition: stable    Attending Attestation: A qualified resident physician was not available      Ellery Plunk, MD 04/02/2011, 10:59 AM

## 2011-04-01 NOTE — CDI REVIEW (Signed)
Admissions, Findings, and Consultations:  4/16 CTS Pre-Op H&P (clinic)=44 yo female with CAD:  Will schedule CABG to be performed on 04/01/11.  Pt will have CXR. UA and PFT'S prior to hospital DC today.  PNH=HTN, DM, seizures, HPL, tobacco use, CVA.      4/24 CTS (needs co-sign)=coagulopathy overnight requiring a chest tube placed this AM to left for large pleural effusion.  1) place chest tube for large left pleural effusion  2) acute post op anemia resolved  3) coagulopathy resolved  4) wean to extubate  5) wean Dobutamine to keep C.I. > 2.2  6) pulm. Toilet  7) restart home meds  8) will leave foley cath for accurate I/O.      No sign NN.   Diagnostics and Results:    Labs okay per dx.     Procedures and Treatments:    4/23 1.  Coronary artery bypass grafting x2 with left internal mammary artery to the left anterior descending coronary artery.  2.  Saphenous vein graft to OM2 artery.  3.  Endoscopic vein harvest.    4/23 PRBCs    4/24 Chest Tube   Meds, IV's, Rx Blood:    04/01/11 0957    midazolam (VERSED) injection    04/01/11 0811    lidocaine injection    04/01/11 0808    midazolam (VERSED) injection     04/01/11 0314    potassium chloride in SW premix infusion    04/01/11 0314    calcium gluconate injection     04/01/11 0203    calcium gluconate injection    04/01/11 0157    vasopressin (PITRESSIN) injection     03/31/11 2200    heparin 5,000 unit/mL injection 5,000 Units 5,000 Units, SubQ, EVERY 8 HOURS (SCHEDULED)    03/31/11 2200    NS flush syringe 2 mL, INTRACATHETE, EVERY 8 HOURS (SCHEDULED)      03/31/11 2100    insulin glargine (LANTUS) 100 units/mL injection 10 Units, SubQ, NIGHTLY       03/31/11 2030    midazolam (VERSED) injection     03/31/11 2000    ceFAZolin (ANCEF) 2 g in D5W 50 mL IVPB 2 g, IV, EVERY 8 HOURS     04/01/11 0500    phenylephrine (NEO-SYNEPHRINE) 60 mg in NS 250 mL infusion 0.5 mcg/kg/min, IV, CONTINUOUS         04/01/11 0400    vasopressin (PITRESSIN) 100 Units in NS 250 mL  infusion 0.04 Units/min, IV, CONTINUOUS       03/31/11 2000    insulin regular human (HUMULIN R) 250 Units in NS 250 mL infusion 2 Units/hr, IV, CONTINUOUS      03/31/11 2000    D5W premix infusion IV, CONTINUOUS      03/31/11 2000    DOBUTamine (DOBUTREX) 1000 mg in D5W 250 mL premix infusion 2.5 mcg/kg/min, IV, CONTINUOUS     Comments:

## 2011-04-01 NOTE — Nurses Notes (Signed)
Report received from day shift RN. Assessment, vitals, kardex, MAR, and plan of care reviewed.  Will continue to monitor.

## 2011-04-01 NOTE — OR Surgeon (Signed)
[facility]   DEPARTMENT OF SURGERY   OPERATION SUMMARY    PATIENT NAME: Monique [name], Monique [name]  HOSPITAL RUEAVW:[id number]  DATE OF SERVICE:03/31/2011  DATE OF BIRTH: 07-Jun-1966    PREOPERATIVE DIAGNOSIS: Severe 2-vessel coronary artery disease.     POSTOPERATIVE DIAGNOSIS: Severe 2-vessel coronary artery disease.    NAME OF PROCEDURE:  1. Coronary artery bypass grafting x2 with left internal mammary artery to the left anterior descending coronary artery.   2. Saphenous vein graft to OM2 artery.  3. Endoscopic vein harvest.    SURGEONS: Iona Coach MD (staff), Gayla Medicus PA-C. Note: No qualified resident physician available to help.    ANESTHESIA: General.    INDICATIONS FOR PROCEDURE: This is a woman who has a high-grade proximal LAD stenosis as well as OM2 stenosis.     OPERATIVE FINDINGS: Cardiopulmonary bypass time was 68 minutes. Cross-clamp time was 53 minutes.    DESCRIPTION OF PROCEDURE: The patient was draped and prepped in the usual fashion with chlorhexidine prep. Incision was made along the medial aspect of her left leg. We dissected down the saphenous vein, mobilized the saphenous vein proximally and distally. All vein branches were identified. They were bipolar cauterized. Stab incision was made in the groin. We brought the vein to the skin level where it was ligated and divided. The vein was then removed and reversed, striped and Hemoclips were placed in all branches. Note incisions were made in both groins; however, the saphenous vein was harvested from the right leg.     Simultaneously, a median sternotomy incision was performed. Electrocautery was used to control subcutaneous bleeding and dissect to the level of the sternum. The sternum was divided with a saw. Periosteum was scored with electrocautery. A mammary artery retractor was placed. The left pleural space was opened in its entirety. The LIMA was mobilized and all vein branches were identified. The distal saphenous vein was ligated with a suture ligature and then divided. Hemoclips clips were placed on the graft pedicle as well as the chest wall pedicle. With hemostasis achieved, I then removed the mammary artery retractor and placed a sternal retractor, divided the thymus and pericardium with electrocautery and suspended the heart in a pericardial cradle. Pursestrings were placed in the ascending aorta and the aorta was cannulated without difficulty. Pursestring was placed in the right atrium and the right atrium was cannulated without difficulty. Pursestrings were placed in the ascending aorta and the aorta was cannulated without difficulty. The patient was placed on cardiopulmonary bypass. Aorta was separated from the pulmonary artery, aorta was cross-clamped and the heart was arrested with antegrade cardioplegia. I first identified the OM2 artery, opened it sharply, created an end-to-side anastomosis between saphenous vein and the OM2 artery using 7-0 Prolene. I brought the vein graft to the left side of the aorta, made an aortotomy with an 11 knife, punched it to a 4-mm punch and then created an end-to-side anastomosis between the saphenous vein and the aorta using 6-0 Prolene. Next, I identified the LAD, I made a slit in my pericardium, brought my LIMA through the slit. I then opened the LAD sharply. I created an end-to-side anastomosis between the saphenous vein and the LAD using 7-0 Prolene. I tacked the graft pedicle to the epicardium of the heart with two 5-0 Prolene. I placed a ventricular pacing wire and 2 atrial  pacing wires. I gave antegrade hot shot and released the bulldog off my LIMA. I allowed the heart to start to beat. I  then released my cross-clamp and removed my aortic vent after deairing the aorta and the vein graft. I tied my pursestring tight and reinforced it with pledgeted Prolene suture. I weaned the patient from cardiopulmonary bypass, removed my venous cannula, tied my pursestring tight and reinforced it with pledgeted Prolene suture. I removed my aortic cannula, tied my pursestring tight and reinforced it with pledgeted Prolene suture. I administered Protamine and obtained hemostasis. I placed 3 chest tubes, 1 in each pleural space, 1 in the mediastinum. I placed vancomycin paste in the bone marrow. After obtaining hemostasis, I then partially closed the pericardium and reapproximated the sternum with sternal wires. I closed the fascial plane with a Vicryl suture, soft tissue and skin layers with Monocryl suture. Sterile dressings were applied to the wound. I then transported the patient to the ICU in a critical but stable condition. All needle and sponge counts were correct x2 per nursing.      Iona Coach, MD  Instructor  [facility]    [id number]/[id number]; D: 03/31/2011 23:00:37; T: 04/01/2011 03:45:34    cc: Mahyar [employer] DO   [address]   [place], [place] [address]

## 2011-04-01 NOTE — Nurses Notes (Signed)
Pts urine output low. Lyanne Co, ACNP notified. Orders received for a 250cc bolus of NS. Will continue to monitor.

## 2011-04-01 NOTE — Nurses Notes (Signed)
Report received from night shift RN.  Patient kardex, assessment, and Mar reviewed.  Patient awake following commands.  Patient remains restrained in bilateral upper soft wrist restraints. Patient needs frequently reoriented, and continues to pull at lines and tubes.Will cont. To moniter

## 2011-04-01 NOTE — Care Management Notes (Signed)
Care Coordinator/Social Work Plan  [place]  Patient Name: Monique [name] MRN: [id number] Acct Number: [id number]  DOB: [birth year] Age: 45  **Admission Information**  Patient Type: INPATIENT  Admit Date: 03/31/2011 Admit Time: 10:17  Admit Reason: CAD  Admitting Phys: Ellery Plunk  Attending Phys: Ellery Plunk  Unit: CTU Bed: CTU-03  100. Adult Admission Assessment  Created by : Faustino Congress Date/Time 2011-04-01 40:[id number]  Patient Assessment & Education  Level of Care Met with pt/family/other and provided education on INPT patient class   CCC/MSW met with pt/family/other and provided education on On role of the CM Dept in their hospital stay On role of the  Clinical Care Coordinator On role of the Medical Social Worker On role with discharge/transitional planning, How to  contact Care Management Department, [facility] and MSW. On role in placement in a Acute Rehabilitation Facility.   CCC/MSW reviewed with patient/family/other [facility] Anticipated Needs Transportation plan Patient/family  questions answered. Patient/family/guardian verbalized understanding.   Caregiver Contact Information  Primary Contact: Name/Relationship Hart Robinsons / Husband Primary / Cell Phone Number [phone]  Mental Status:  Mental Status Cooperative   Current mental status close to baseline Unable to assess baseline   Communications:  Primary Language: English   In what languge does the patient/caregiver want the educational material documented: English   Does the patient/caregiver need a Translator: No   Substance Use:  Tobacco: No   Alcohol: No   Illegal Drugs: No   Current or Suspected Abuse and/or Neglect:  Adult Yes   Domestic Violence No   Living Arrangments  Prior to admission housing arrangements: With Spouse   Steps: Number of steps to enter 0 Number of steps inside 0  If the patient is returning to a home does the home have running water: Yes   If the patient is returning to a home does the home have  Electricity: Yes   If the patient is returning to a home does the home have Refrigerator (Medications): Yes   If the patient is returning to a home does the home have Heat: Yes   Support System:  Caregiver availble for assistance with post hospitalization? Yes, Name and phone Yancy Hascall [phone]  Agencies the patient was using prior to admission: None   Agencies, Pharmacy, Dialysis and PCP Active Prior to Admission:  Does other members of the household have DME or HH in the home? If so, please specify agency Yes Dan Humphreys  Does the patient have prescription coverage? Yes- Free Clinic, assistance program, other Medicare  Patient's Pharmacy Name & Phone Number [facility] [address] [place], [place] - [address]  Primary Care Physician Practice/MD Name & Phone Number Tyron Russell, MD  Functional Status  Prior to admission Ambulation Status-: Minimum Assistance   Prior to admission Activities of daily living Minimum Assistance   Current Admission Ambulation Status- Moderate Assistance   Current Activities of Daily Living Moderate Assistance   Case Discussed with:  Case Discussed with Patient Yes   Case Discussed with Caregive(s) and Relationship to Patient: Yes, Name, relationship and contact information [name] / Husband/ [phone]   Case Discussed with Medical Team (MD, RN, OT, NP, PA) Yes, Name and profession PA  Was the Patient and/or caregiver(s) able to verbalize understanding of the discharge plan? Yes   Additional Information  Note: Hospital Day: 1 Post-op Day: 1 S/P CABG ASSESSMENT/PLAN: place chest tube for large left pleural effusion,  acute post op anemia resolved, coagulopathy  resolved, wean to extubate, wean Dobutamine to keep C.I. > 2.2, pulm.  Toilet, restart home meds, will leave foley cath for accurate I/O CM obtained most of assessment information from  husband as wife was still waking from anesthesia.

## 2011-04-01 NOTE — Nurses Notes (Addendum)
2000: 500cc Hesban given for tachycardia and decreased pressure.  2200:  Orders obtained for 2 units PRBCs. H/H 7.8/23.5.  Pt HR increasing steadily since 2000.  Pt ST rates 100-120s. Art line 100-110s/60-70s MAP >65.  0200:  Patient BP 80-90s/40-50s MAP 50-60s.  Pt diaphoretic. Urine output decreased over past 2 hours, previous hourly output 20 cc/hr.  Chest tube output 50-100 cc/hr sanguinous fluid.  Lyanne Co, ACNP to bedside.  Orders obtained for 500 NS bolus, 500 Hesban.  0215: Dobutamine increased. Neo gtt restarted, titrated up to 2 mcg.  0230Francesco Runner, MD to bedside.  Patient MAP still 50-60s.  Vaso gtt started 6 cc/hr. ABG, stat CXR, 1 amp Calcium gluconate, increase insulin gtt 2 units/hr and bolus with 8 units Humilin R (per Francesco Runner orders based on 0200 FS 176)  0240:  Francesco Runner, MD sterile suctioned chest tubes for 100 cc sanguinous fluid.  Stat CXR obtained. Orders obtained for 3 units PRBCs.  0300:  3 units PRBCs transfused. Vaso gtt max'd at 18 cc/hr.  Slowly titrated down.  Neo gtt turned off. 1000cc bicarb gtt bolused.  Pt bicarb 18.     See MAR for gtt titrations, labs, and charting for blood administration.

## 2011-04-01 NOTE — CDI QUERY (Addendum)
1. Key Symbol: E 1. Key Response: A     1. Query Type: 003-Generic Documentation/Clarification Required   Physician Service: SCT   Comments:  Is Pleural effusion related to cabg?    4/26 Query read but not addressed.  Forwarding to Dr. Francesco Runner.  CMT.    4/27 Additional CT was place for hemothorax following CABG is documented by Dr. Francesco Runner on 4/26.  CMT.

## 2011-04-01 NOTE — Nurses Notes (Signed)
Pt. Awake, following commands.  Patient Abg, within normal limits see flow sheet.  Patient extubated by RT, with RN at bedside per order.  Patient tolerated well. Patient has no signs of stridor, chest rise is symmetrical lung sounds audible over lung fields.  See flow sheet.

## 2011-04-01 NOTE — CDI QUERY (Addendum)
1. Key Symbol: E 1. Key Response: A     1. Query Type: 005-Anemia Type   Physician Service: SCT   Comments:  Need further clarification of acute post-op anemia.    4/26 acute post op bleeding anemia has been clarified on 4/24 CTS PN.  CMT.

## 2011-04-01 NOTE — Progress Notes (Addendum)
[facility]   CARDIAC SURGERY ICU PROGRESS NOTE      Monique [name], Monique [name]  Date of Admission:  03/31/2011  Date of Birth:  Sep 24, 1966    Hospital Day:  LOS: 1 day   Post-op Day:  1 Day Post-Op, S/P  CABG  Date of Service:  04/01/2011    SUBJECTIVE: coagulopathy overnight requiring a chest tube placed this AM to left for large pleural effusion    OBJECTIVE:    Vital Signs:  Temp (24hrs) Max:36.6 C (97.9 F)      Temperature: 36.6 C (97.9 F) (03/31/11 1049)  BP (Non-Invasive): 85/63 mmHg (04/01/11 0245)  MAP (Non-Invasive): 68 mmHG (04/01/11 0245)  Heart Rate: 120  (04/01/11 0945)  Respiratory Rate: 14  (03/31/11 1049)  SpO2-1: 94 % (04/01/11 0945)    Base (Admission) Weight:  Base Weight (ADM): 112 kg (246 lb 14.6 oz)  Weight:  Weight: 112 kg (246 lb 14.6 oz)    Hemodynamics:  CVP: 7 MM HG  PCWP: 4.5 MM HG  SPAP/DPAP: 18/9 mmHg  CO: 4.8 L/min  CI: 2.3   ART-Line  MAP: 79 mmHg  SVR: 1120     Ventilator Settings:Conventional:  Mode: CPAP/PS  Set VT: 500 mL  Set Rate: 14 Breaths Per Minute  Set PEEP: 8 cmH2O  Pressure Support: 12 cmH2O  FiO2: 50 %    Blood Gas Results:  Recent Labs   Basename 04/01/11 0608 04/01/11 0423 04/01/11 0303   . SPECIMENTYPE ARTERIAL ARTERIAL ARTERIAL   . FI02 50 50 100   . PH 7.430 7.350 7.390   . PCO2 36.0 33.0* 39.0   . PO2 56* 77 378*   . BICARBONATE 24.7 19.7 24.0   . BASEEXCESS Test Not Performed Test Not Performed Test Not Performed   . BASEDEFICIT 0.2 6.6* 1.2   . PFRATIO 112* 154* 378         Weaning Parameters:         Current Inpatient Medications:    Current facility-administered medications:  vasopressin (PITRESSIN) injection ---Cabinet Override      vasopressin (PITRESSIN) 100 Units in NS 250 mL infusion 0.04 Units/min Intravenous Continuous   calcium gluconate injection ---Cabinet Override      phenylephrine (NEO-SYNEPHRINE) 60 mg in NS 250 mL infusion 0.5 mcg/kg/min Intravenous Continuous   calcium gluconate injection ---Cabinet Override       potassium chloride in SW premix infusion ---Cabinet Override      potassium chloride in SW premix infusion ---Cabinet Override      midazolam (VERSED) injection ---Cabinet Override      lidocaine injection ---Cabinet Override      midazolam (VERSED) injection ---Cabinet Override      heparin 5,000 unit/mL injection 5,000 Units 5,000 Units Subcutaneous Q8HRS   ceFAZolin (ANCEF) 2 g in D5W 50 mL IVPB 2 g Intravenous Q8H   morphine 2 mg/mL injection  1-2 mg Intravenous Q1H PRN   morphine 4 mg/mL injection  3-4 mg Intravenous Q1H PRN   insulin regular human (HUMULIN R) 250 Units in NS 250 mL infusion 2 Units/hr Intravenous Continuous   insulin R human (HUMULIN R) 100 units/mL injection  4-14 Units Intravenous Q1H PRN   dextrose 50% (0.5 g/mL) injection  12.5 g Intravenous Q1H PRN   furosemide (LASIX) 10 mg/mL injection  20 mg Intravenous Q1H PRN   SSIP insulin R human (HUMULIN R) 100 units/mL injection  2-6 Units Subcutaneous 4x/day PRN   potassium chloride 20 mEq in SW 50 mL  premix infusion  20 mEq Intravenous Q1H PRN   potassium chloride 20 mEq in SW 50 mL premix infusion  20 mEq Intravenous Q1H PRN   NS flush syringe  2 mL Intracatheter Q8HRS   NS flush syringe  2-6 mL Intracatheter Q1 MIN PRN   D5W premix infusion   Intravenous Continuous   DOBUTamine (DOBUTREX) 1000 mg in D5W 250 mL premix infusion  2.5 mcg/kg/min Intravenous Continuous   insulin glargine (LANTUS) 100 units/mL injection  10 Units Subcutaneous NIGHTLY   midazolam (VERSED) injection ---Cabinet Override            Appropriate Home Meds restarted:  Yes    I/O:  I/O last 24 hours to current time:    Intake/Output Summary (Last 24 hours) at 04/01/11 1033  Last data filed at 04/01/11 0800   Gross per 24 hour   Intake 5397.07 ml   Output   2935 ml   Net 2462.07 ml       I/O last 3 completed shifts:  In: 5397.07 [I.V.:567.07; Blood:1730; Other:3100]  Out: 2895 [Urine:625; Other:285; Chest Tube:1985]    Output 24 Hours 8 Hours   Urine  340 80    Chest Tube #1 270 200   Chest Tube #2     Chest Tube #3     NG                 Nutrition/Diet:  NPO    Labs:  Reviewed:  I have reviewed all lab results.      Radiology:    Reviewed:  CXR:  Direct visualization of the image from 04/01/2011 showed  normal lung fields, left pleural effusion   Physical Exam:    General: acutely ill  Lungs: Clear to auscultation bilaterally. , decreased breath sounds left > right  Cardiovascular: regular rate and rhythm  Abdomen: Soft, non-tender  Extremities: pedal edema 3+ bilateral    ASSESSMENT/PLAN:  1) place chest tube for large left pleural effusion  2) acute post op bleeding anemia resolved  3) coagulopathy resolved  4) wean to extubate  5) wean Dobutamine to keep C.I. > 2.2  6) pulm. Toilet  7) restart home meds  8) will leave foley cath for accurate I/O  Problem List:  There are no hospital problems to display for this patient.      PT/OT: Yes    Patient has decision making capacity:  yes  Advance Directive:  Living Will  DNR Status:  No Order    DVT RISK FACTORS HAVE BEN ASSESSED AND PROPHYLAXIS ORDERED (SEE RUBYONLINE - REFERENCE TOOLS - MD, DVT PROPHY OR POCKET CARD):  YES    Disposition Planning: Home discharge      Werner Lean, NP 04/01/2011, 10:33 AM      Large left pleura effusion requiring additional chest tube. Will wean Dobutrex today.  I personally saw and examined the patient. See Midlevel's note for additional details.    Ellery Plunk, MD 04/01/2011, 3:19 PM

## 2011-04-02 ENCOUNTER — Inpatient Hospital Stay (HOSPITAL_COMMUNITY): Payer: Medicare Other

## 2011-04-02 LAB — PERFORM POC WHOLE BLOOD GLUCOSE
GLUCOSE, POINT OF CARE: 102 mg/dL (ref 70–105)
GLUCOSE, POINT OF CARE: 109 mg/dL — ABNORMAL HIGH (ref 70–105)
GLUCOSE, POINT OF CARE: 109 mg/dL — ABNORMAL HIGH (ref 70–105)
GLUCOSE, POINT OF CARE: 112 mg/dL — ABNORMAL HIGH (ref 70–105)
GLUCOSE, POINT OF CARE: 119 mg/dL — ABNORMAL HIGH (ref 70–105)
GLUCOSE, POINT OF CARE: 129 mg/dL — ABNORMAL HIGH (ref 70–105)
GLUCOSE, POINT OF CARE: 130 mg/dL — ABNORMAL HIGH (ref 70–105)
GLUCOSE, POINT OF CARE: 133 mg/dL — ABNORMAL HIGH (ref 70–105)
GLUCOSE, POINT OF CARE: 134 mg/dL — ABNORMAL HIGH (ref 70–105)
GLUCOSE, POINT OF CARE: 136 mg/dL — ABNORMAL HIGH (ref 70–105)
GLUCOSE, POINT OF CARE: 136 mg/dL — ABNORMAL HIGH (ref 70–105)
GLUCOSE, POINT OF CARE: 138 mg/dL — ABNORMAL HIGH (ref 70–105)
GLUCOSE, POINT OF CARE: 139 mg/dL — ABNORMAL HIGH (ref 70–105)
GLUCOSE, POINT OF CARE: 154 mg/dL — ABNORMAL HIGH (ref 70–105)
GLUCOSE, POINT OF CARE: 159 mg/dL — ABNORMAL HIGH (ref 70–105)
GLUCOSE, POINT OF CARE: 84 mg/dL (ref 70–105)
GLUCOSE, POINT OF CARE: 97 mg/dL (ref 70–105)

## 2011-04-02 LAB — BASIC METABOLIC PANEL
ANION GAP: 9 mmol/L (ref 5–16)
BUN/CREAT RATIO: 15 (ref 6–22)
BUN: 18 mg/dL (ref 6–20)
CALCIUM: 7.2 mg/dL — ABNORMAL LOW (ref 8.5–10.4)
CARBON DIOXIDE: 21 mmol/L — ABNORMAL LOW (ref 22–32)
CHLORIDE: 106 mmol/L (ref 96–111)
CREATININE: 1.18 mg/dL — ABNORMAL HIGH (ref 0.49–1.10)
ESTIMATED GLOMERULAR FILTRATION RATE: 49 mL/min/{1.73_m2} — ABNORMAL LOW (ref >59)
GLUCOSE,NONFAST: 127 mg/dL (ref 65–139)
POTASSIUM: 4.5 mmol/L (ref 3.5–5.1)
SODIUM: 136 mmol/L (ref 136–145)

## 2011-04-02 LAB — CBC
HCT: 26.5 % — ABNORMAL LOW (ref 33.5–45.2)
HGB: 8.5 g/dL — ABNORMAL LOW (ref 11.5–15.2)
MCH: 28.2 pg (ref 27.4–33.0)
MCHC: 32 g/dL (ref 31.6–35.5)
MCV: 88.2 fL (ref 82.0–99.0)
MPV: 7.2 FL — ABNORMAL LOW (ref 7.4–10.4)
PLATELET COUNT: 194 10*3/uL (ref 140–450)
RBC: 3.01 MIL/uL — ABNORMAL LOW (ref 3.84–5.04)
RDW: 13.3 % (ref 10.2–14.0)
WBC: 16.3 THOU/uL — ABNORMAL HIGH (ref 3.5–11.0)

## 2011-04-02 MED ORDER — FUROSEMIDE 10 MG/ML INJECTION SOLUTION
INTRAMUSCULAR | Status: AC
Start: 2011-04-02 — End: 2011-04-02
  Administered 2011-04-02: 40 mg
  Filled 2011-04-02: qty 4

## 2011-04-02 MED ORDER — INSULIN LISPRO (U-100) 100 UNIT/ML SUBCUTANEOUS SOLUTION
3.0000 [IU] | Freq: Three times a day (TID) | SUBCUTANEOUS | Status: DC
Start: 2011-04-02 — End: 2011-04-06
  Administered 2011-04-02 – 2011-04-05 (×11): 3 [IU] via SUBCUTANEOUS
  Filled 2011-04-02: qty 3

## 2011-04-02 MED ORDER — AMIODARONE 200 MG TABLET
400.00 mg | ORAL_TABLET | Freq: Three times a day (TID) | ORAL | Status: DC
Start: 2011-04-03 — End: 2011-04-06
  Administered 2011-04-03 – 2011-04-06 (×10): 400 mg via ORAL
  Filled 2011-04-02 (×12): qty 2

## 2011-04-02 MED ORDER — INSULIN GLARGINE (U-100) 100 UNIT/ML SUBCUTANEOUS SOLUTION
25.00 [IU] | Freq: Every evening | SUBCUTANEOUS | Status: DC
Start: 2011-04-02 — End: 2011-04-06
  Administered 2011-04-02 – 2011-04-05 (×4): 25 [IU] via SUBCUTANEOUS
  Filled 2011-04-02 (×4): qty 25

## 2011-04-02 MED ORDER — ONDANSETRON HCL (PF) 4 MG/2 ML INJECTION SOLUTION
INTRAMUSCULAR | Status: AC
Start: 2011-04-02 — End: 2011-04-02
  Administered 2011-04-02: 4 mg
  Filled 2011-04-02: qty 2

## 2011-04-02 MED ORDER — HYDROMORPHONE (PF) 1 MG/ML INJECTION SOLUTION
0.2000 mg | INTRAMUSCULAR | Status: DC | PRN
Start: 2011-04-02 — End: 2011-04-06
  Administered 2011-04-02 – 2011-04-04 (×10): 0.2 mg via INTRAVENOUS
  Filled 2011-04-02 (×9): qty 1

## 2011-04-02 MED ORDER — FUROSEMIDE 10 MG/ML INJECTION SOLUTION
40.0000 mg | Freq: Once | INTRAMUSCULAR | Status: AC
Start: 2011-04-02 — End: 2011-04-02

## 2011-04-02 MED ORDER — INSULIN LISPRO 100 UNIT/ML SUB-Q SSIP
4.00 [IU] | INJECTION | Freq: Four times a day (QID) | SUBCUTANEOUS | Status: DC | PRN
Start: 2011-04-02 — End: 2011-04-06
  Administered 2011-04-03 – 2011-04-05 (×4): 4 [IU] via SUBCUTANEOUS
  Filled 2011-04-02 (×2): qty 3

## 2011-04-02 MED ORDER — METOPROLOL TARTRATE 12.5 MG HALF TAB
12.50 mg | ORAL_TABLET | Freq: Two times a day (BID) | ORAL | Status: DC
Start: 2011-04-02 — End: 2011-04-06
  Administered 2011-04-02 – 2011-04-06 (×9): 12.5 mg via ORAL
  Filled 2011-04-02 (×10): qty 1

## 2011-04-02 MED ORDER — SODIUM CHLORIDE 0.9 % IV BOLUS
500.0000 mL | INJECTION | Freq: Once | Status: AC
Start: 2011-04-02 — End: 2011-04-02
  Administered 2011-04-02: 500 mL via INTRAVENOUS

## 2011-04-02 NOTE — Nurses Notes (Signed)
Pt urine output continues to be low. See flowsheet. Lyanne Co, ACNP notified. No orders given at this time. Will continue to monitor.

## 2011-04-02 NOTE — Nurses Notes (Signed)
 Pt urine out put low. See flow sheet. Corean Lefevre, ACNP notified. Orders received to cut Dobutamine  gtt in half and to give 500cc bolus of NS. Will continue to monitor.

## 2011-04-02 NOTE — Ancillary Notes (Addendum)
[facility]  Spiritual Care Follow-up Note    Patient Name:  Monique [name]  Date of Service:   04/02/2011    Reason for visit: post surgery visit    Referral from: Rounds on unit    Patient's Support Persons Present: spouse    Other pertinent information:  Visited with patient and her husband, who was bedside.  Provided spiritual care and empathy for her following her surgery.  We talked about the difficulty of what she has faced and how she is looking forward to feeling better and being out of pain and soreness.      Spiritual Assessment: physical pain and suffering is dominating her mind right now    Interventions: Offered empathy and Provided supportive presence    Outcomes:  Spiritual Care relationship established    Spiritual issues for future visits: Explore with her further any spiritual needs she may identify and want help with.    Chaplain: Ulyess Mort, Chaplain Resident   Pager: [phone]  Total Time of Encounter:   10 min.    Ulyess Mort  I have reviewed and cosigned this note as written.  Rev. Derl Barrow, Th.M., [facility], Director of [facility] and Education

## 2011-04-02 NOTE — Consults (Addendum)
[facility]  Endocrine Coverage Note    Monique [name]  Date of service: 04/02/2011    Interval/Cross coverage update  S: Monique [name] is a 45 y.o. female with DMII s/p CABG. Per report most recent HgA1c was 12.7 on 10/20/10.    O: BP 85/63   Pulse 82   Temp 36.5 C (97.7 F)   Resp 24   Ht 1.753 m (5\' 9" )   Wt 112 kg (246 lb 14.6 oz)   BMI 36.46 kg/m2   SpO2 95%  NAD  A&O    Results for Monique [name], Monique [name] (MRN [id number]) as of 04/02/2011 15:41   Ref. Range 04/02/2011 06:50 04/02/2011 08:35 04/02/2011 09:54 04/02/2011 11:20 04/02/2011 12:10 04/02/2011 13:02 04/02/2011 14:05 04/02/2011 14:57   GLUCOSE Latest Range: 70-105 mg/dL 696 (H) 295 (H) 284 (H) 133 (H) 136 (H) 112 (H) 109 (H) 102     A/P: Monique [name] is a 45 y.o. female with uncontrolled DMII.  - Increase lantus to 25U  - Continue lispro with meals and SS lispro  - Discontinue D5W    Kathee Polite, MD 04/02/2011, 3:45 PM  PGY-2  Department of Internal Medicine  [employer]     I have seen this patient and reviewed the chart and examined the patient and agree with the above note. Blima Dessert, MD 04/03/2011, 7:25 AM

## 2011-04-02 NOTE — Nurses Notes (Signed)
Pts urine output continues to be at 15cc/hr. Lyanne Co, ACNP notified. Orders received to administer 40mg  of Lasix IV. Will continue to monitor.

## 2011-04-02 NOTE — Progress Notes (Addendum)
 [place]  [facility]   CARDIAC SURGERY ICU PROGRESS NOTE      Dejanique, Ruehl  Date of Admission:  03/31/2011  Date of Birth:  06-Dec-1966    Hospital Day:  LOS: 2 days   Post-op Day:  2 Days Post-Op, S/P  CABG  Date of Service:  04/02/2011    SUBJECTIVE: No acute issues overnight    OBJECTIVE:    Vital Signs:  No data recorded.      Temperature: 36.6 C (97.9 F) (03/31/11 1049)  BP (Non-Invasive): 85/63 mmHg (04/01/11 0245)  MAP (Non-Invasive): 68 mmHG (04/01/11 0245)  Heart Rate: 87  (04/02/11 1100)  Respiratory Rate: 22  (04/02/11 1100)  Pain Score: 8 (04/02/11 0950)  SpO2-1: 97 % (04/02/11 1100)    Base (Admission) Weight:  Base Weight (ADM): 112 kg (246 lb 14.6 oz)  Weight:  Weight: 112 kg (246 lb 14.6 oz)    Hemodynamics:  CVP: 11 MM HG  PCWP: 4.5 MM HG  SPAP/DPAP: 23/13 mmHg  CO: 6.3 L/min  CI: 2.7   ART-Line  MAP: 83 mmHg  SVR: 909     Blood Gas Results:  Recent Labs   [facility] 04/01/11 1231   . SPECIMENTYPE ARTERIAL   . FI02 44   . PH 7.430   . PCO2 35.0   . PO2 52*   . BICARBONATE 24.1   . BASEEXCESS Test Not Performed   . BASEDEFICIT 0.8   . PFRATIO 118*       Current Inpatient Medications:    Current facility-administered medications:  metoprolol  (LOPRESSOR ) tablet  12.5 mg Oral Q12H   amiodarone  (CORDARONE ) tablet  400 mg Oral 3x/day   insulin  lispro human (HUMALOG ) 100 units/mL injection  3 Units Subcutaneous 3x/day AC   SSIP insulin  lispro human (HUMALOG ) 100 units/mL injection  4-12 Units Subcutaneous 4x/day PRN   HYDROmorphone  (DILAUDID ) 1 mg/mL injection  0.2 mg Intravenous Q2H PRN   oxyCODONE -acetaminophen  (PERCOCET) 5-325mg  per tablet  1-2 Tab Oral Q4H PRN   docusate sodium  (COLACE) capsule  100 mg Oral 2x/day   sennosides-docusate sodium  (SENOKOT-S) 8.6-50mg  per tablet  1 Tab Oral NIGHTLY   prenatal vitamin-iron  fumarate-folic acid   (NATALCARE PLUS) 27-0.8mg  per tablet  1 Tab Oral Daily   aspirin  chewable tablet 81 mg 81 mg Oral Daily   simvastatin  (ZOCOR ) tablet  20 mg Oral QPM      heparin  5,000 unit/mL injection 5,000 Units 5,000 Units Subcutaneous Q8HRS   ceFAZolin  (ANCEF ) 2 g in D5W 50 mL IVPB 2 g Intravenous Q8H   insulin  regular human (HUMULIN  R) 250 Units in NS 250 mL infusion 2 Units/hr Intravenous Continuous   insulin  R human (HUMULIN  R) 100 units/mL injection  4-14 Units Intravenous Q1H PRN   dextrose  50% (0.5 g/mL) injection  12.5 g Intravenous Q1H PRN   NS flush syringe  2 mL Intracatheter Q8HRS   NS flush syringe  2-6 mL Intracatheter Q1 MIN PRN   D5W premix infusion   Intravenous Continuous   insulin  glargine (LANTUS ) 100 units/mL injection  10 Units Subcutaneous NIGHTLY       I/O:  I/O last 24 hours to current time:    Intake/Output Summary (Last 24 hours) at 04/02/11 1129  Last data filed at 04/02/11 0800   Gross per 24 hour   Intake 1483.03 ml   Output    822 ml   Net 661.03 ml       I/O last 3 completed shifts:  In: 1610.13 [P.O.:240; I.V.:1370.13]  Out: 1087 [Urine:475; Chest Tube:612]    Output 24 Hours 8 Hours   Urine  650 65   Chest Tube #1 1480 30   Chest Tube #2 0 5   Chest Tube #3 680 20   CT 90 22               Nutrition/Diet:  CALORIE CONTROLLED    Hardware (Lines, foley, tubes):   Date Placed Necessity Reviewed  Date Discontinued    Chest Tube #1       Chest Tube #2       Chest Tube #3       Pacer Wires:  removed       Foley (out day number two)                      Labs:  Reviewed:  BMP:     Recent Labs   Basename 04/02/11 0504   . SODIUM 136   . POTASSIUM 4.5   . CHLORIDE 106   . CO2 21*   . BUN 18   . CREATININE 1.18*   . GLUCOSENF 127   . GLUCOSEFAST --   . ANIONGAP 9   . BUNCRRATIO 15   . GFR 49*   . CALCIUM  7.2*       CBC Results Differential Results   Recent Labs   [facility] 04/02/11 0504   . WBC 16.3*   . HGB 8.5*   . HCT 26.5*   . PLTCNT 194      No results found for this or any previous visit (from the past 30 hour(s)).     Radiology:    Reviewed:  CXR:  Direct visualization of the image from 04/02/2011 showed  microatelectasis     Physical Exam:    Lungs:  Clear to auscultation bilaterally.   Cardiovascular: regular rate and rhythm     sternum stable  Abdomen: Soft, non-tender  Extremities: pedal edema 2+ bilateral  Neurologic: Alert and oriented x3    ASSESSMENT/PLAN:    1.  Transfer to floor today  2.  D/C SWAN  3.  D/C mediastinal CTs and pacer wires  4.  Acute post op anemia--transfuse PRBCs X1  5.  Hyperglycemia--wean insulin  gtt, lantus  and lispro coverage  6.  Cardiac--ASA, metoprolol , statin  7.  Fluid overload--diurese with lasix  40mg  IV  8.  OOB to chair, ambulate  9.  Pulm toilet    Problem List:  There are no hospital problems to display for this patient.      DNR Status:  No Order    DVT RISK FACTORS HAVE BEN ASSESSED AND PROPHYLAXIS ORDERED (SEE RUBYONLINE - REFERENCE TOOLS - MD, DVT PROPHY OR POCKET CARD):  YES    Disposition Planning: Home discharge      Evalene Charlies Arts, ANP 04/02/2011, 11:29 AM      Improving slowly. I will transfer to floor today.  I personally saw and examined the patient. See Midlevel's note for additional details.    Curtistine Massie Chiles, MD 04/02/2011, 12:16 PM

## 2011-04-02 NOTE — Nurses Notes (Signed)
Day shift report received. Kardex, MAR and labs reviewed. Pt OOB in recliner. Pt states she has 9/10 surgical incisional pain. Percocet x2 po given per PRN order. Will monitor for decrease in pain level. See flow sheet for complete detailed assessment.

## 2011-04-03 ENCOUNTER — Inpatient Hospital Stay (HOSPITAL_COMMUNITY): Payer: Medicare Other

## 2011-04-03 ENCOUNTER — Encounter (HOSPITAL_COMMUNITY): Payer: Self-pay

## 2011-04-03 LAB — CBC
HCT: 27.4 % — ABNORMAL LOW (ref 33.5–45.2)
HCT: 27.2 % — ABNORMAL LOW (ref 33.5–45.2)
HGB: 8.7 g/dL — ABNORMAL LOW (ref 11.5–15.2)
HGB: 8.9 g/dL — ABNORMAL LOW (ref 11.5–15.2)
MCH: 28.1 pg (ref 27.4–33.0)
MCH: 29 pg (ref 27.4–33.0)
MCHC: 31.9 g/dL (ref 31.6–35.5)
MCHC: 32.6 g/dL (ref 31.6–35.5)
MCV: 88 fL (ref 82.0–99.0)
MCV: 88.9 fL (ref 82.0–99.0)
MPV: 7 FL — ABNORMAL LOW (ref 7.4–10.4)
MPV: 7 FL — ABNORMAL LOW (ref 7.4–10.4)
PLATELET COUNT: 239 10*3/uL (ref 140–450)
PLATELET COUNT: 254 10*3/uL (ref 140–450)
RBC: 3.11 MIL/uL — ABNORMAL LOW (ref 3.84–5.04)
RBC: 3.06 MIL/uL — ABNORMAL LOW (ref 3.84–5.04)
RDW: 14.1 % — ABNORMAL HIGH (ref 10.2–14.0)
RDW: 14.1 % — ABNORMAL HIGH (ref 10.2–14.0)
WBC: 17.6 10*3/uL — ABNORMAL HIGH (ref 3.5–11.0)
WBC: 17.7 THOU/uL — ABNORMAL HIGH (ref 3.5–11.0)

## 2011-04-03 LAB — BASIC METABOLIC PANEL
ANION GAP: 6 mmol/L (ref 5–16)
BUN/CREAT RATIO: 19 (ref 6–22)
BUN: 25 mg/dL — ABNORMAL HIGH (ref 6–20)
CALCIUM: 7.3 mg/dL — ABNORMAL LOW (ref 8.5–10.4)
CARBON DIOXIDE: 23 mmol/L (ref 22–32)
CHLORIDE: 102 mmol/L (ref 96–111)
CREATININE: 1.3 mg/dL — ABNORMAL HIGH (ref 0.49–1.10)
ESTIMATED GLOMERULAR FILTRATION RATE: 44 ml/min/1.73m2 — ABNORMAL LOW (ref >59)
GLUCOSE,NONFAST: 122 mg/dL (ref 65–139)
POTASSIUM: 4.6 mmol/L (ref 3.5–5.1)
SODIUM: 131 mmol/L — ABNORMAL LOW (ref 136–145)

## 2011-04-03 LAB — TYPE AND CROSS RED CELLS - UNITS
ABO/RH(D): O POS
ANTIBODY SCREEN: NEGATIVE
UNITS ORDERED: 9

## 2011-04-03 LAB — PERFORM POC WHOLE BLOOD GLUCOSE
GLUCOSE, POINT OF CARE: 138 mg/dL — ABNORMAL HIGH (ref 70–105)
GLUCOSE, POINT OF CARE: 147 mg/dL — ABNORMAL HIGH (ref 70–105)
GLUCOSE, POINT OF CARE: 156 mg/dL — ABNORMAL HIGH (ref 70–105)
GLUCOSE, POINT OF CARE: 134 mg/dL — ABNORMAL HIGH (ref 70–105)
GLUCOSE, POINT OF CARE: 110 mg/dL — ABNORMAL HIGH (ref 70–105)

## 2011-04-03 MED ORDER — HETASTARCH 6 % IN 0.9 % SODIUM CHLORIDE INTRAVENOUS SOLUTION
500.0000 mL | Freq: Once | INTRAVENOUS | Status: AC
Start: 2011-04-03 — End: 2011-04-03
  Administered 2011-04-03: 500 mL via INTRAVENOUS

## 2011-04-03 MED ORDER — SODIUM CHLORIDE 0.9 % (FLUSH) INJECTION SYRINGE
2.00 mL | INJECTION | INTRAMUSCULAR | Status: DC | PRN
Start: 2011-04-03 — End: 2011-04-06

## 2011-04-03 MED ORDER — VANCOMYCIN 1,000 MG INTRAVENOUS INJECTION
2.00 g | Freq: Once | INTRAVENOUS | Status: DC | PRN
Start: 2011-04-03 — End: 2011-04-04
  Filled 2011-04-03 (×2): qty 20

## 2011-04-03 MED ORDER — FUROSEMIDE 10 MG/ML INJECTION SOLUTION
20.0000 mg | Freq: Once | INTRAMUSCULAR | Status: AC
Start: 2011-04-03 — End: 2011-04-03

## 2011-04-03 MED ORDER — FUROSEMIDE 10 MG/ML INJECTION SOLUTION
INTRAMUSCULAR | Status: AC
Start: 2011-04-03 — End: 2011-04-03
  Administered 2011-04-03: 20 mg
  Filled 2011-04-03: qty 2

## 2011-04-03 MED ORDER — SODIUM CHLORIDE 0.9 % (FLUSH) INJECTION SYRINGE
2.00 mL | INJECTION | Freq: Three times a day (TID) | INTRAMUSCULAR | Status: DC
Start: 2011-04-03 — End: 2011-04-06
  Administered 2011-04-03 – 2011-04-05 (×6): 2 mL
  Administered 2011-04-05: 0 mL
  Administered 2011-04-05 – 2011-04-06 (×2): 2 mL

## 2011-04-03 NOTE — CDI REVIEW (Signed)
Admissions, Findings, and Consultations:  4/24 CTS=Large left pleura effusion requiring additional chest tube. Will wean Dobutrex today.    4/25 CTS=No acute issues overnight.  1.  Transfer to floor today  2.  D/C SWAN  3.  D/C mediastinal CTs and pacer wires  4.  Acute post op anemia--transfuse PRBCs X1  5.  Hyperglycemia--wean insulin gtt, lantus and lispro coverage  6.  Cardiac--ASA, metoprolol, statin  7.  Fluid overload--diurese with lasix 40mg  IV  8.  OOB to chair, ambulate  9.  Pulm toilet  Staff=Improving slowly. I will transfer to floor today.      4/25 Endo=Pt with uncontrolled DMII.  - Increase lantus to 25U  - Continue lispro with meals and SS lispro - Discontinue D5W.      No sign NN.   Diagnostics and Results:    Labs  4/26 Na 131, Cr 1.30   Procedures and Treatments:   Meds, IV's, Rx Blood:    04/03/11 0900    amiodarone (CORDARONE) tablet 400 mg, Oral, 3 TIMES DAILY    04/02/11 2100    insulin glargine (LANTUS) 100 units/mL injection 25 Units, SubQ, NIGHTLY    04/02/11 1200    insulin lispro human (HUMALOG) 100 units/mL injection 3 Units, SubQ, 3 TIMES DAILY BEFORE MEALS      04/02/11 0900    prenatal vitamin-iron fumarate-folic acid (NATALCARE PLUS) 27-0.8mg  per tablet 1 Tab, Oral, DAILY     04/02/11 0900    aspirin chewable tablet 81 mg 81 mg, Oral, DAILY       04/02/11 0800    metoprolol (LOPRESSOR) tablet 12.5 mg, Oral, EVERY 12 HOURS       04/02/11 0000    docusate sodium (COLACE) capsule 100 mg, Oral, 2 TIMES DAILY          04/02/11 0000    sennosides-docusate sodium (SENOKOT-S) 8.6-50mg  per tablet 1 Tab, Oral, NIGHTLY    04/02/11 0000    simvastatin (ZOCOR) tablet 20 mg, Oral, EVERY EVENING      Comments:

## 2011-04-03 NOTE — Nurses Notes (Signed)
NP Charm Barges at bedside to pull chest tubes on right and left pleural.  Patient tolerated well, xeraform dressing applied with 4x4. Patient right radial arterial line removed per MD orders, pressure dressing applied, patient tolerated well, no s/s of infection.  Foley catheter removed as per MD order, patient tolerated well, urine output adequate prior to removal, will continue to monitor.

## 2011-04-03 NOTE — Nurses Notes (Signed)
Received report from night shift RN. Kardex, labs, and plan of care reviewed. Full assessment performed and documented per flowsheet. Will continue to monitor.

## 2011-04-03 NOTE — Care Management Notes (Signed)
Care Coordinator/Social Work Plan  [place]  Patient Name: Monique [name] MRN: [id number] Acct Number: [id number]  DOB: Apr 12, 1966 Age: 45  **Admission Information**  Patient Type: INPATIENT  Admit Date: 03/31/2011 Admit Time: 10:17  Admit Reason: CAD  Admitting Phys: Ellery Plunk  Attending Phys: Ellery Plunk  Unit: 10E Bed: 1055-A  130. CM Assessment Notes  Created by : Melvyn Neth Date/Time 2011-04-03 12:10:46.677  Assessment Notes  Note:  Note: HD#3 36.8 C (98.2 F) 85 20 128/71 on 5L NC sat 93% 1. Transfer to floor when bed available 2. D/C remaining  chest tubes 3. Cardiac--continue ASA, metoprolol, statin 4. Fluid overload--received lasix IV overnight 5. OOB to  chair, ambulate 6. Pulm toilet   Consult and Reassessment Will continue to re-assess for changes in plan of care or discharge needs

## 2011-04-03 NOTE — Progress Notes (Addendum)
 [place]  [facility]   CARDIAC SURGERY ICU PROGRESS NOTE      Monique [name], Steenbergen  Date of Admission:  03/31/2011  Date of Birth:  30-Apr-1966    Hospital Day:  LOS: 3 days   Post-op Day:  3 Days Post-Op, S/P  CABG  Date of Service:  04/03/2011    SUBJECTIVE: No acute issues overnight, awaiting floor bed    OBJECTIVE:    Vital Signs:  Temp (24hrs) Max:36.9 C (98.4 F)      Temperature: 36.7 C (98.1 F) (04/03/11 0800)  BP (Non-Invasive): 85/63 mmHg (04/01/11 0245)  MAP (Non-Invasive): 68 mmHG (04/01/11 0245)  Heart Rate: 86  (04/03/11 0900)  Respiratory Rate: 20  (04/03/11 0900)  Pain Score: 7 (04/03/11 0622)  SpO2-1: 96 % (04/03/11 0900)    Base (Admission) Weight:  Base Weight (ADM): 112 kg (246 lb 14.6 oz)  Weight:  Weight: 112 kg (246 lb 14.6 oz)    Hemodynamics:  CVP: 12 MM HG  PCWP: 4.5 MM HG  SPAP/DPAP: 23/12 mmHg  CO: 6.3 L/min  CI: 2.7   ART-Line  MAP: 71 mmHg  SVR: 909     Current Inpatient Medications:    Current facility-administered medications:  metoprolol  (LOPRESSOR ) tablet  12.5 mg Oral Q12H   amiodarone  (CORDARONE ) tablet  400 mg Oral 3x/day   insulin  lispro human (HUMALOG ) 100 units/mL injection  3 Units Subcutaneous 3x/day AC   SSIP insulin  lispro human (HUMALOG ) 100 units/mL injection  4-12 Units Subcutaneous 4x/day PRN   HYDROmorphone  (DILAUDID ) 1 mg/mL injection  0.2 mg Intravenous Q2H PRN   insulin  glargine (LANTUS ) 100 units/mL injection  25 Units Subcutaneous NIGHTLY   oxyCODONE -acetaminophen  (PERCOCET) 5-325mg  per tablet  1-2 Tab Oral Q4H PRN   docusate sodium  (COLACE) capsule  100 mg Oral 2x/day   sennosides-docusate sodium  (SENOKOT-S) 8.6-50mg  per tablet  1 Tab Oral NIGHTLY   prenatal vitamin-iron  fumarate-folic acid   (NATALCARE PLUS) 27-0.8mg  per tablet  1 Tab Oral Daily   aspirin  chewable tablet 81 mg 81 mg Oral Daily   simvastatin  (ZOCOR ) tablet  20 mg Oral QPM   heparin  5,000 unit/mL injection 5,000 Units 5,000 Units Subcutaneous Q8HRS   NS flush syringe  2 mL  Intracatheter Q8HRS   NS flush syringe  2-6 mL Intracatheter Q1 MIN PRN     I/O:  I/O last 24 hours to current time:    Intake/Output Summary (Last 24 hours) at 04/03/11 0948  Last data filed at 04/03/11 0900   Gross per 24 hour   Intake    864 ml   Output   1215 ml   Net   -351 ml       I/O last 3 completed shifts:  In: 896 [P.O.:300; I.V.:596]  Out: 1220 [Urine:1050; Chest Tube:170]    Output 24 Hours 8 Hours   Urine  740 220   Chest Tube #1 10 15    Chest Tube #2 120 40   Chest Tube #3     NG                 Nutrition/Diet:  CALORIE CONTROLLED    Hardware (Lines, foley, tubes):   Date Placed Necessity Reviewed  Date Discontinued    Chest Tube #1       Chest Tube #2       Chest Tube #3       Pacer Wires:  removed       Foley (out day number two)  Labs:  Reviewed:  BMP:     Recent Labs   Basename 04/03/11 0208   . SODIUM 131*   . POTASSIUM 4.6   . CHLORIDE 102   . CO2 23   . BUN 25*   . CREATININE 1.30*   . GLUCOSENF 122   . GLUCOSEFAST --   . ANIONGAP 6   . BUNCRRATIO 19   . GFR 44*   . CALCIUM  7.3*       CBC Results Differential Results   Recent Labs   [facility] 04/03/11 0729   . WBC 17.7*   . HGB 8.9*   . HCT 27.2*   . PLTCNT 254      No results found for this or any previous visit (from the past 30 hour(s)).       Radiology:    Reviewed:  CXR:  Direct visualization of the image from 04/03/2011 showed  microatelectasis     Physical Exam:    Lungs: Clear to auscultation bilaterally.   Cardiovascular: regular rate and rhythm  Abdomen: Soft, non-tender  Extremities: pedal edema 2+ bilateral  Neurologic: Alert and oriented x3    ASSESSMENT/PLAN:    1. Transfer to floor when bed available  2. D/C remaining chest tubes  3. Cardiac--continue ASA, metoprolol , statin   4. Fluid overload--received lasix  IV overnight  5. OOB to chair, ambulate   6. Pulm toilet    Problem List:  There are no hospital problems to display for this patient.      DNR Status:  Full Code    DVT RISK FACTORS HAVE BEN ASSESSED AND  PROPHYLAXIS ORDERED (SEE RUBYONLINE - REFERENCE TOOLS - MD, DVT PROPHY OR POCKET CARD):  YES    Disposition Planning: Home discharge      Evalene Charlies Arts, ANP 04/03/2011, 9:48 AM  Doing well. Additional CT was place for hemothorax following CABG. All CT out today. Will plan on D/C in 2-3 days.  I personally saw and examined the patient. See Midlevel's note for additional details.    Curtistine Massie Chiles, MD 04/03/2011, 5:25 PM

## 2011-04-03 NOTE — Nurses Notes (Signed)
Pt urine output10-30 cc/hr after hespan infusion. Dellia Nims, ACNP notified. 20 mg Lasix IV given per order. Will monitor for increase in urine output.

## 2011-04-03 NOTE — Nurses Notes (Signed)
Pt urine output only 20 cc for past 2 hours. SBP 120-130. Dellia Nims, ACNP notified. hespan infusing as ordered. Will monitor for increase in urine output.

## 2011-04-04 ENCOUNTER — Inpatient Hospital Stay (HOSPITAL_COMMUNITY): Payer: Medicare Other | Admitting: Radiology

## 2011-04-04 LAB — PERFORM POC WHOLE BLOOD GLUCOSE
GLUCOSE, POINT OF CARE: 129 mg/dL — ABNORMAL HIGH (ref 70–105)
GLUCOSE, POINT OF CARE: 120 mg/dL — ABNORMAL HIGH (ref 70–105)
GLUCOSE, POINT OF CARE: 186 mg/dL — ABNORMAL HIGH (ref 70–105)
GLUCOSE, POINT OF CARE: 148 mg/dL — ABNORMAL HIGH (ref 70–105)
GLUCOSE, POINT OF CARE: 151 mg/dL — ABNORMAL HIGH (ref 70–105)

## 2011-04-04 LAB — H & H
HCT: 25.9 % — ABNORMAL LOW (ref 33.5–45.2)
HGB: 8.4 g/dL — ABNORMAL LOW (ref 11.5–15.2)

## 2011-04-04 NOTE — Nurses Notes (Signed)
Patient still needing assist of 2 to help her up and to lift her legs in bed and pull her up in bed.

## 2011-04-04 NOTE — Care Management Notes (Signed)
Care Coordinator/Social Work Plan  [place]  Patient Name: Monique [name] MRN: [id number] Acct Number: [id number]  DOB: 1966-09-29 Age: 45  **Admission Information**  Patient Type: INPATIENT  Admit Date: 03/31/2011 Admit Time: 10:17  Admit Reason: CAD  Admitting Phys: Ellery Plunk  Attending Phys: Ellery Plunk  Unit: 10E Bed: 1055-A  130. CM Assessment Notes  Created by : Jeneen Montgomery Date/Time 2011-04-04 10:01:06.603  Assessment Notes  Note:  Note: While issuing IMM to pt, pt's husband asked about pt having hospital bed at discharge. MSW explained that pt  likely would not meet criteria but encouraged pt/family to discuss with service. Informed service of conversation and  that pt was requesting to see someone from service. Updated CCC Mifsud.   Consult and Reassessment Will continue to re-assess for changes in plan of care or discharge needs

## 2011-04-04 NOTE — CDI REVIEW (Signed)
Admissions, Findings, and Consultations:  4/26 CTS=No acute issues overnight, awaiting floor bed.  Doing well. Additional CT was place for hemothorax following CABG. All CT out today. Will plan on D/C in 2-3 days.    No sign NN.   Diagnostics and Results:    Labs okay per dx.     Procedures and Treatments:   Meds, IV's, Rx Blood:    04/03/11 1335    NS flush syringe 2 mL, INTRACATHETE, EVERY 8 HOURS (SCHEDULED)    04/03/11 0900    amiodarone (CORDARONE) tablet 400 mg, Oral, 3 TIMES DAILY      Comments:

## 2011-04-04 NOTE — Nurses Notes (Signed)
 Encouragement given to patient re: activity and emotional status with surgery, patient states she understands and each day will get easier for activity.

## 2011-04-04 NOTE — Care Management Notes (Signed)
Care Coordinator/Social Work Plan  [place]  Patient Name: COLE KLUGH MRN: [id number] Acct Number: [id number]  DOB: 05-25-1966 Age: 45  **Admission Information**  Patient Type: INPATIENT  Admit Date: 03/31/2011 Admit Time: 10:17  Admit Reason: CAD  Admitting Phys: Ellery Plunk  Attending Phys: Ellery Plunk  Unit: 10E Bed: 1055-A  130. CM Assessment Notes  Created by : Beverely Pace Date/Time 2011-04-04 16:50:01.040  Assessment Notes  Note:  Note: Per notes/task by Bobi Misfud, CCC - SCT Dr Francesco Runner S/P CABG, sternal precautions (no lifting, pulling, driving)  Pt. inquiring abaout hospital bed. CCC working on arranging upon dc to home. Pt has a waterbed at home and not able to  maintain sternal prec with it. Per family, states have spoken to [facility], and state they will provide DME  for them. Taks sent to arrange for DME to Staff assistants to follow.   Consult and Reassessment Will continue to re-assess for changes in plan of care or discharge needs

## 2011-04-04 NOTE — Nurses Notes (Signed)
 9948- Patient complains of incisional pain while at rest described as aching with a rating of 7/10. Dilaudid  0.2 mg given IV per prn order.

## 2011-04-04 NOTE — Progress Notes (Addendum)
Patient's resting oxygen saturations 86% on room air.      Gayla Medicus, PA-C 04/04/2011, 3:50 PM  Ellery Plunk, MD 04/04/2011, 4:17 PM

## 2011-04-04 NOTE — Ancillary Notes (Signed)
[facility]  DIABETES EDUCATION CONSULT     Stopped to offer diabetes education. Patient states that she has had diabetes since 1999. She states that she takes Glucophage, Januvia and Glimiperide. She verbalized understanding and 100% compliance with her medication regimen. She stated that she tests 3-4 times a day using a Contour meter. She states that she ranges in the 80-130's mg/dL. She denies issues with obtaining testing supplies or medications. She denies hypoglycemia. We reviewed progression of disease and avoidance of complications by managing blood sugars. She follows with Dr. Earl Lites for her diabetes. Answered all questions. Verbalized understanding.         Kathaleen Grinder, RN 04/04/2011, 1:22 PM

## 2011-04-04 NOTE — Nurses Notes (Signed)
Trying to wean patient down off o2, at 4liters nc, sats 93% at rest in bed, with hob elevated  45degrees.

## 2011-04-04 NOTE — Progress Notes (Addendum)
[facility]   CARDIAC SURGERY FLOOR/STEPDOWN PROGRESS NOTE      Monique [name], Monique [name]  Date of Admission:  03/31/2011  Date of Birth:  October 12, 1966    Hospital Day:  LOS: 4 days   Post-op Day:  4 Days Post-Op, S/P CABG x 2  Date of Service:  04/04/2011    SUBJECTIVE: Monique [name] is doing well today.  +OOB.  +ambulating in hallways.  She denies chest pain, SOB, and palpitations.  Appetite good.  +BM.  O2 sats 96% on 4L NC.    OBJECTIVE:    Vital Signs:  Temp (24hrs) Max:36.8 C (98.2 F)      Temperature: 36.7 C (98.1 F) (04/04/11 0540)  BP (Non-Invasive): 117/63 mmHg (04/04/11 0840)  MAP (Non-Invasive): 84 mmHG (04/04/11 0840)  Heart Rate: 70  (04/04/11 0840)  Respiratory Rate: 20  (04/04/11 0840)  Pain Score: 0 (04/04/11 0842)  SpO2-1: 93 % (04/04/11 0540)    Base (Admission) Weight:  Base Weight (ADM): 112 kg (246 lb 14.6 oz)  Weight:  Weight: 121.9 kg (268 lb 11.9 oz)    Current Inpatient Medications:    Current facility-administered medications:  NS flush syringe  2 mL Intracatheter Q8HRS   NS flush syringe  2-6 mL Intracatheter Q1 MIN PRN   metoprolol (LOPRESSOR) tablet  12.5 mg Oral Q12H   amiodarone (CORDARONE) tablet  400 mg Oral 3x/day   insulin lispro human (HUMALOG) 100 units/mL injection  3 Units Subcutaneous 3x/day AC   SSIP insulin lispro human (HUMALOG) 100 units/mL injection  4-12 Units Subcutaneous 4x/day PRN   HYDROmorphone (DILAUDID) 1 mg/mL injection  0.2 mg Intravenous Q2H PRN   insulin glargine (LANTUS) 100 units/mL injection  25 Units Subcutaneous NIGHTLY   oxyCODONE-acetaminophen (PERCOCET) 5-325mg  per tablet  1-2 Tab Oral Q4H PRN   docusate sodium (COLACE) capsule  100 mg Oral 2x/day   sennosides-docusate sodium (SENOKOT-S) 8.6-50mg  per tablet  1 Tab Oral NIGHTLY   prenatal vitamin-iron fumarate-folic acid  (NATALCARE PLUS) 27-0.8mg  per tablet  1 Tab Oral Daily   aspirin chewable tablet 81 mg 81 mg Oral Daily   simvastatin (ZOCOR) tablet  20 mg Oral QPM    heparin 5,000 unit/mL injection 5,000 Units 5,000 Units Subcutaneous Q8HRS   NS flush syringe  2 mL Intracatheter Q8HRS   NS flush syringe  2-6 mL Intracatheter Q1 MIN PRN       Appropriate Home Meds restarted:  Yes    I/O:  I/O last 24 hours to current time:    Intake/Output Summary (Last 24 hours) at 04/04/11 0940  Last data filed at 04/04/11 0540   Gross per 24 hour   Intake    703 ml   Output    400 ml   Net    303 ml       I/O last 3 completed shifts:  In: 709 [P.O.:700; I.V.:9]  Out: 535 [Urine:510; Chest Tube:25]    Output 24 Hours 8 Hours   Urine  730 200   Chest Tube #1     Chest Tube #2     Chest Tube #3     NG                 Nutrition/Diet:  CALORIE CONTROLLED    Labs:  Reviewed:  Lab Results for Last 24 Hours:    Results for orders placed during the hospital encounter of 03/31/11 (from the past 24 hour(s))   POCT WHOLE BLOOD GLUCOSE   Component Value  Range   . GLUCOSE, POINT OF CARE 156 (*) 70 - 105 (mg/dL)   POCT WHOLE BLOOD GLUCOSE   Component Value Range   . GLUCOSE, POINT OF CARE 134 (*) 70 - 105 (mg/dL)   POCT WHOLE BLOOD GLUCOSE   Component Value Range   . GLUCOSE, POINT OF CARE 110 (*) 70 - 105 (mg/dL)   POCT WHOLE BLOOD GLUCOSE   Component Value Range   . GLUCOSE, POINT OF CARE 129 (*) 70 - 105 (mg/dL)   H & H   Component Value Range   . HGB 8.4 (*) 11.5 - 15.2 (g/dL)   . HCT 25.9 (*) 33.5 - 45.2 (%)       Ordered:  CBC, BMP in AM    Radiology:    Reviewed:  CXR:  Direct visualization of the image from 04/04/2011 showed  shallow inspiration, slight left basilar haziness, no pneumothorax     Physical Exam:    General: no distress  Lungs: decreased breath sounds left base  Cardiovascular: regular rate and rhythm  Abdomen: Soft, non-tender, non-distended  Extremities: pedal edema 1+ bilateral, bilateral LE incisions C/D/I  Skin: Skin warm and dry, No rashes and sternal incision C/D/I, sternum stable  Neurologic: Grossly normal    ASSESSMENT/PLAN:  1. S/P CABG  2. Wean O2 to RA as tolerated   3. Continue ambulation as tolerated  4. Possibly DC home tomorrow    PT/OT: No    DVT RISK FACTORS HAVE BEN ASSESSED AND PROPHYLAXIS ORDERED (SEE RUBYONLINE - REFERENCE TOOLS - MD, DVT PROPHY OR POCKET CARD):  YES    Disposition Planning: Home discharge      Florence Canner, PA-C 04/04/2011, 9:40 AM  Late entery. Will plan for D/C Sat. Will need hospital bed for 2 months secondary to having a water bed and due to sternal percautions will not be able to use the water bad for 2 months.  I personally saw and examined the patient. See Midlevel's note for additional details.    Ellery Plunk, MD 04/05/2011, 7:56 AM

## 2011-04-04 NOTE — Nurses Notes (Signed)
Visited with patient at bedside today.  Discussed using good sternal precautions for 6 to 8 weeks from surgery date to include no lifting anything over 10 pounds and no driving for 4 weeks.  Encouraged no smoking, taking showers instead of baths and not using lotion or oils on sternotomy site.  Talked about walking multiple times a day to continue to gain strength.  Provided pt with discharge instruction education sheet on heart surgery and operative diagram of heart bypasses. Reminded patient of 2 week follow up appointment with surgeon and 4 week follow up appointment with cardiologist after discharge.  All questions answered. Maureen Chatters, RN 04/04/2011, 3:19 PM

## 2011-04-04 NOTE — Care Management Notes (Signed)
Care Coordinator/Social Work Plan  [place]  Patient Name: Monique [name] MRN: [id number] Acct Number: [id number]  DOB: 06/27/1966 Age: 45  **Admission Information**  Patient Type: INPATIENT  Admit Date: 03/31/2011 Admit Time: 10:17  Admit Reason: CAD  Admitting Phys: Ellery Plunk  Attending Phys: Ellery Plunk  Unit: 10E Bed: 1055-A  160. CMS Important Message / Detailed Notice  Created by : Jeneen Montgomery Date/Time 2011-04-04 09:59:37.000  Medicare Important Message/Detailed Notice  Discharge- CMS-Important Message from Medicare About Your Rights (CMS-1405) IMM was delivered to the patient. A copy was  provided to the patient. (Date / Time) 04/04/2011 08:55

## 2011-04-04 NOTE — Care Management Notes (Signed)
Care Coordinator/Social Work Plan  [place]  Patient Name: LEZETTE KITTS MRN: [id number] Acct Number: [id number]  DOB: June 02, 1966 Age: 45  **Admission Information**  Patient Type: INPATIENT  Admit Date: 03/31/2011 Admit Time: 10:17  Admit Reason: CAD  Admitting Phys: Ellery Plunk  Attending Phys: Ellery Plunk  Unit: 10E Bed: 1055-A  130. CM Assessment Notes  Created by : Raleigh Nation Date/Time 2011-04-04 22:46:47.807  Assessment Notes  Note:  Note: Requested by staff assistant to clarify DME vendor to arrange for delivery. Spoke with pt and husband in pt's  room to discuss. Pt's husband states they spoke directly with Sadie Haber at [facility] in [place]. They  had requested demographics, insurance info and script to arrange for delivery of hospital bed for d/c tomorrow. Their  contact # is [phone] and fax [phone]. Per husband, they have stated insurance would cover and pt would be  responsible for $28/month for the first 3 months. Pt also inquiring about home O2 and if it had been ordered or not.   Will alert [facility] to follow up tomorrow.   Consult and Reassessment Will continue to re-assess for changes in plan of care or discharge needs

## 2011-04-05 LAB — CBC
HCT: 26 % — ABNORMAL LOW (ref 33.5–45.2)
HGB: 8.4 g/dL — ABNORMAL LOW (ref 11.5–15.2)
MCH: 28.5 pg (ref 27.4–33.0)
MCHC: 32.1 g/dL (ref 31.6–35.5)
MCV: 88.7 fL (ref 82.0–99.0)
MPV: 6.4 FL — ABNORMAL LOW (ref 7.4–10.4)
PLATELET COUNT: 380 10*3/uL (ref 140–450)
RBC: 2.94 MIL/uL — ABNORMAL LOW (ref 3.84–5.04)
RDW: 13.9 % (ref 10.2–14.0)
WBC: 16.3 10*3/uL — ABNORMAL HIGH (ref 3.5–11.0)

## 2011-04-05 LAB — BASIC METABOLIC PANEL
ANION GAP: 7 mmol/L (ref 5–16)
BUN/CREAT RATIO: 31 — ABNORMAL HIGH (ref 6–22)
BUN: 24 mg/dL — ABNORMAL HIGH (ref 6–20)
CALCIUM: 7.7 mg/dL — ABNORMAL LOW (ref 8.5–10.4)
CARBON DIOXIDE: 26 mmol/L (ref 22–32)
CHLORIDE: 101 mmol/L (ref 96–111)
CREATININE: 0.77 mg/dL (ref 0.49–1.10)
ESTIMATED GLOMERULAR FILTRATION RATE: >59 ml/min/1.73m2 (ref >59)
GLUCOSE,NONFAST: 94 mg/dL (ref 65–139)
POTASSIUM: 4.3 mmol/L (ref 3.5–5.1)
SODIUM: 134 mmol/L — ABNORMAL LOW (ref 136–145)

## 2011-04-05 LAB — PERFORM POC WHOLE BLOOD GLUCOSE
GLUCOSE, POINT OF CARE: 160 mg/dL — ABNORMAL HIGH (ref 70–105)
GLUCOSE, POINT OF CARE: 113 mg/dL — ABNORMAL HIGH (ref 70–105)

## 2011-04-05 MED ORDER — OXYCODONE-ACETAMINOPHEN 5 MG-325 MG TABLET
1.00 | ORAL_TABLET | ORAL | Status: DC | PRN
Start: 2011-04-05 — End: 2011-04-05

## 2011-04-05 MED ORDER — FERROUS SULFATE 324 MG (65 MG IRON) TABLET,DELAYED RELEASE
324.0000 mg | DELAYED_RELEASE_TABLET | Freq: Two times a day (BID) | ORAL | Status: DC
Start: 2011-04-05 — End: 2011-04-06
  Administered 2011-04-05 – 2011-04-06 (×3): 324 mg via ORAL
  Filled 2011-04-05 (×4): qty 1

## 2011-04-05 MED ORDER — FUROSEMIDE 10 MG/ML INJECTION SOLUTION
20.0000 mg | Freq: Once | INTRAMUSCULAR | Status: AC
Start: 2011-04-05 — End: 2011-04-05
  Administered 2011-04-05: 20 mg via INTRAVENOUS
  Filled 2011-04-05: qty 2

## 2011-04-05 MED ORDER — OXYCODONE-ACETAMINOPHEN 5 MG-325 MG TABLET
1.00 | ORAL_TABLET | ORAL | Status: DC | PRN
Start: 2011-04-05 — End: 2011-05-21

## 2011-04-05 NOTE — Nurses Notes (Signed)
Patient's o2 sats are 93% on 2L NC at rest.

## 2011-04-05 NOTE — Care Management Notes (Signed)
 Care Coordinator/Social Work Plan  [place]  Patient Name: Monique [name] MRN: [id number] Acct Number: [id number]  DOB: [birth year] Age: 45  **Admission Information**  Patient Type: INPATIENT  Admit Date: 03/31/2011 Admit Time: 10:17  Admit Reason: CAD  Admitting Phys: QUINTIN CURTISTINE SKATES  Attending Phys: QUINTIN CURTISTINE SKATES  Unit: 10E Bed: 1055-A  130. CM Assessment Notes  Created by : Renella Loa Date/Time 2011-04-05 11:24:10.187  Assessment Notes  Note:  Note: Met with the patient and her husband. Will plan to have portable oxygen tank delivered to the bedside. Oxygen  script entered into [employer] and signed. Explained to the patient that she did not meet Medicare guidelines for a hospital  bed. Mr. Nolden said he will arrange for the patient to sleep in a single bed. Dr. QUINTIN was also notified. Plan is for  discharge home on Sunday.   Consult and Reassessment Will continue to re-assess for changes in plan of care or discharge needs

## 2011-04-05 NOTE — Care Management Notes (Signed)
 Referral Information  ++++++ Placed Provider #1 ++++++  Case Manager: Brad Rumps  Provider Type: DME  Provider Name: Camelia Brazen [facility]  Address:  [address]  [place], [place] [address]  Contact:   Fax:   Fax:

## 2011-04-05 NOTE — Nurses Notes (Signed)
24 Hour Note:  Monique [name] is a 45 year old female admitted for a scheduled CABG on 03/31/11. She is currently post op day #5 with a possible discharge to home today. Foley and chest tubes were d/c on 04/03/11. Pt is voiding adequately. Pt c/o SOB with exertion and requires 2LNC to maintain O2 sats 93% at rest. 04/04/11 chest xray showed slight left basilar haziness, no pneumothorax. Pt stands with an assist of two from sitting position, and ambulates with the assist of one.

## 2011-04-05 NOTE — Progress Notes (Addendum)
[facility]   CARDIAC SURGERY FLOOR/STEPDOWN PROGRESS NOTE      Monique [name], Monique [name]  Date of Admission:  03/31/2011  Date of Birth:  [birth year]    Hospital Day:  LOS: 5 days   Post-op Day:  5 Days Post-Op, S/P  CABG  Date of Service:  04/05/2011    SUBJECTIVE:  Monique [name] is doing well today.  She states she is not ready to go home today.  Home oxygen is arranged.  O2 sats 93% on 2L NC.      OBJECTIVE:    Vital Signs:  Temp (24hrs) Max:36.8 C (98.2 F)      Temperature: 36.8 C (98.2 F) (04/05/11 0621)  BP (Non-Invasive): 117/59 mmHg (04/05/11 0621)  MAP (Non-Invasive): 84 mmHG (04/04/11 0840)  Heart Rate: 76  (04/05/11 0621)  Respiratory Rate: 20  (04/05/11 0621)  Pain Score: 9 (04/05/11 0831)  SpO2-1: 93 % (04/05/11 0621)    Base (Admission) Weight:  Base Weight (ADM): 112 kg (246 lb 14.6 oz)  Weight:  Weight: 123 kg (271 lb 2.7 oz)    Current Inpatient Medications:    Current facility-administered medications:  ferrous sulfate 324 mg (elemental IRON 65 mg) tab  324 mg Oral 2x/day   furosemide (LASIX) 10 mg/mL injection  20 mg Intravenous Once   NS flush syringe  2 mL Intracatheter Q8HRS   NS flush syringe  2-6 mL Intracatheter Q1 MIN PRN   metoprolol (LOPRESSOR) tablet  12.5 mg Oral Q12H   amiodarone (CORDARONE) tablet  400 mg Oral 3x/day   insulin lispro human (HUMALOG) 100 units/mL injection  3 Units Subcutaneous 3x/day AC   SSIP insulin lispro human (HUMALOG) 100 units/mL injection  4-12 Units Subcutaneous 4x/day PRN   HYDROmorphone (DILAUDID) 1 mg/mL injection  0.2 mg Intravenous Q2H PRN   insulin glargine (LANTUS) 100 units/mL injection  25 Units Subcutaneous NIGHTLY   oxyCODONE-acetaminophen (PERCOCET) 5-325mg  per tablet  1-2 Tab Oral Q4H PRN   docusate sodium (COLACE) capsule  100 mg Oral 2x/day   sennosides-docusate sodium (SENOKOT-S) 8.6-50mg  per tablet  1 Tab Oral NIGHTLY   prenatal vitamin-iron fumarate-folic acid  (NATALCARE PLUS) 27-0.8mg  per tablet  1 Tab Oral Daily    aspirin chewable tablet 81 mg 81 mg Oral Daily   simvastatin (ZOCOR) tablet  20 mg Oral QPM   heparin 5,000 unit/mL injection 5,000 Units 5,000 Units Subcutaneous Q8HRS   NS flush syringe  2 mL Intracatheter Q8HRS   NS flush syringe  2-6 mL Intracatheter Q1 MIN PRN       Appropriate Home Meds restarted:  Yes    I/O:  I/O last 24 hours to current time:    Intake/Output Summary (Last 24 hours) at 04/05/11 1048  Last data filed at 04/05/11 0700   Gross per 24 hour   Intake    742 ml   Output    950 ml   Net   -208 ml       I/O last 3 completed shifts:  In: 862 [P.O.:840; I.V.:22]  Out: 950 [Urine:950]    Output 24 Hours 8 Hours   Urine  850 300   Chest Tube #1     Chest Tube #2     Chest Tube #3     NG                 Nutrition/Diet:  CALORIE CONTROLLED    Labs:  Reviewed:  Lab Results for Last 24 Hours:    Results  for orders placed during the hospital encounter of 03/31/11 (from the past 24 hour(s))   POCT WHOLE BLOOD GLUCOSE   Component Value Range   . GLUCOSE, POINT OF CARE 186 (*) 70 - 105 (mg/dL)   POCT WHOLE BLOOD GLUCOSE   Component Value Range   . GLUCOSE, POINT OF CARE 148 (*) 70 - 105 (mg/dL)   POCT WHOLE BLOOD GLUCOSE   Component Value Range   . GLUCOSE, POINT OF CARE 151 (*) 70 - 105 (mg/dL)   CBC   Component Value Range   . WBC 16.3 (*) 3.5 - 11.0 (THOU/uL)   . RBC 2.94 (*) 3.84 - 5.04 (MIL/uL)   . HGB 8.4 (*) 11.5 - 15.2 (g/dL)   . HCT 26.0 (*) 33.5 - 45.2 (%)   . MCV 88.7  82.0 - 99.0 (fL)   . MCH 28.5  27.4 - 33.0 (pg)   . MCHC 32.1  31.6 - 35.5 (g/dL)   . RDW 13.9  10.2 - 14.0 (%)   . PLATELET COUNT 380  140 - 450 (THOU/uL)   . MPV 6.4 (*) 7.4 - 10.4 (FL)   BASIC METABOLIC PANEL, NON-FASTING   Component Value Range   . SODIUM 134 (*) 136 - 145 (mmol/L)   . POTASSIUM 4.3  3.5 - 5.1 (mmol/L)   . CHLORIDE 101  96 - 111 (mmol/L)   . CARBON DIOXIDE 26  22 - 32 (mmol/L)   . ANION GAP 7  5 - 16 (mmol/L)   . CREATININE 0.77  0.49 - 1.10 (mg/dL)    . ESTIMATED GLOMERULAR FILTRATION RATE >59  >59 (ml/min/1.19m2)   . GLUCOSE,NONFAST 94  65 - 139 (mg/dL)   . BUN 24 (*) 6 - 20 (mg/dL)   . BUN/CREAT RATIO 31 (*) 6 - 22    . CALCIUM 7.7 (*) 8.5 - 10.4 (mg/dL)       Ordered:  CBC, BMP in AM    Physical Exam:    General: no distress  Lungs: Clear to auscultation bilaterally.   Cardiovascular: regular rate and rhythm  Abdomen: Soft, non-tender, non-distended  Extremities: pedal edema 1+ bilateral, bilateral LE incisions C/D/I, +ecchymosis in right thigh  Skin: Skin warm and dry and sternal incision C/D/I, sternum stable  Neurologic: Grossly normal    ASSESSMENT/PLAN:  1. S/P CABG  2. Home oxygen arranged  3. Will check CXR, CBC, BMP in AM  4. Lasix 20mg  IV today  5. DC home tomorrow    PT/OT: No    DVT RISK FACTORS HAVE BEN ASSESSED AND PROPHYLAXIS ORDERED (SEE RUBYONLINE - REFERENCE TOOLS - MD, DVT PROPHY OR POCKET CARD):  YES    Disposition Planning: Home discharge      Florence Canner, PA-C 04/05/2011, 10:48 AM    Slightly anemic. On O2. Will plan on D/C tomorrow if stable. Will recheck H/H. Hopefully will wean off O2 by tomorrow. Will diuresis today.  I personally saw and examined the patient. See Midlevel's note for additional details.    Ellery Plunk, MD 04/05/2011, 11:36 AM

## 2011-04-05 NOTE — Nurses Notes (Signed)
45 year old female admitted for a CABG on 03/31/2011 .  Pt. Has a sternal wound vac and harvest sites to inner knee area bilaterally.  Pt. Is being weaned of O2.  When resting her O2 sats stay between 90 and 93 on Room Air.  Upon exertion of 2-3 minutes her O2 sats will decrease to the mid- to lower 80s.  Her O2 sats recover within 15 seconds upon rest with 2L NC.  Patient is resting in bed with her husband at her bedside.

## 2011-04-06 ENCOUNTER — Inpatient Hospital Stay (HOSPITAL_COMMUNITY): Payer: Medicare Other

## 2011-04-06 LAB — CBC
HCT: 24.3 % — ABNORMAL LOW (ref 33.5–45.2)
HGB: 8.2 g/dL — ABNORMAL LOW (ref 11.5–15.2)
MCH: 30.3 pg (ref 27.4–33.0)
MCHC: 33.7 g/dL (ref 31.6–35.5)
MCV: 89.9 fL (ref 82.0–99.0)
MPV: 6.2 FL — ABNORMAL LOW (ref 7.4–10.4)
PLATELET COUNT: 450 10*3/uL (ref 140–450)
RBC: 2.7 MIL/uL — ABNORMAL LOW (ref 3.84–5.04)
RDW: 13.9 % (ref 10.2–14.0)
WBC: 15.5 THOU/uL — ABNORMAL HIGH (ref 3.5–11.0)

## 2011-04-06 LAB — BASIC METABOLIC PANEL
ANION GAP: 6 mmol/L (ref 5–16)
BUN/CREAT RATIO: 20 (ref 6–22)
BUN: 18 mg/dL (ref 6–20)
CALCIUM: 7.6 mg/dL — ABNORMAL LOW (ref 8.5–10.4)
CARBON DIOXIDE: 27 mmol/L (ref 22–32)
CHLORIDE: 101 mmol/L (ref 96–111)
CREATININE: 0.88 mg/dL (ref 0.49–1.10)
ESTIMATED GLOMERULAR FILTRATION RATE: >59 ml/min/1.73m2 (ref >59)
GLUCOSE,NONFAST: 120 mg/dL (ref 65–139)
SODIUM: 134 mmol/L — ABNORMAL LOW (ref 136–145)

## 2011-04-06 LAB — PERFORM POC WHOLE BLOOD GLUCOSE
GLUCOSE, POINT OF CARE: 133 mg/dL — ABNORMAL HIGH (ref 70–105)
GLUCOSE, POINT OF CARE: 128 mg/dL — ABNORMAL HIGH (ref 70–105)

## 2011-04-06 MED ORDER — FUROSEMIDE 20 MG TABLET
20.0000 mg | ORAL_TABLET | Freq: Once | ORAL | Status: AC
Start: 2011-04-06 — End: 2011-04-06
  Administered 2011-04-06: 20 mg via ORAL
  Filled 2011-04-06: qty 1

## 2011-04-06 MED ORDER — POTASSIUM CHLORIDE ER 10 MEQ TABLET,EXTENDED RELEASE(PART/CRYST)
10.00 meq | ORAL_TABLET | Freq: Two times a day (BID) | ORAL | Status: DC
Start: 2011-04-06 — End: 2011-05-21

## 2011-04-06 MED ORDER — FUROSEMIDE 20 MG TABLET
20.00 mg | ORAL_TABLET | Freq: Two times a day (BID) | ORAL | Status: DC
Start: 2011-04-06 — End: 2011-04-10

## 2011-04-06 MED ORDER — AMIODARONE 200 MG TABLET
200.00 mg | ORAL_TABLET | Freq: Two times a day (BID) | ORAL | Status: DC
Start: 2011-04-06 — End: 2011-05-21

## 2011-04-06 MED ORDER — POTASSIUM CHLORIDE ER 10 MEQ TABLET,EXTENDED RELEASE(PART/CRYST)
10.0000 meq | ORAL_TABLET | Freq: Every day | ORAL | Status: DC
Start: 2011-04-06 — End: 2011-04-06

## 2011-04-06 MED ORDER — POTASSIUM CHLORIDE ER 10 MEQ TABLET,EXTENDED RELEASE(PART/CRYST)
10.0000 meq | ORAL_TABLET | Freq: Once | ORAL | Status: AC
Start: 2011-04-06 — End: 2011-04-06
  Administered 2011-04-06: 10 meq via ORAL
  Filled 2011-04-06: qty 1

## 2011-04-06 MED ORDER — PRENATAL VIT-IRON-FOLATE TAB WRAPPER
1.0000 | ORAL_TABLET | Freq: Every day | Status: DC
Start: 2011-04-06 — End: 2011-05-21

## 2011-04-06 MED ORDER — DOCUSATE SODIUM 100 MG CAPSULE
100.00 mg | ORAL_CAPSULE | Freq: Two times a day (BID) | ORAL | Status: DC
Start: 2011-04-06 — End: 2011-05-21

## 2011-04-06 MED ORDER — FERROUS SULFATE 324 MG (65 MG IRON) TABLET,DELAYED RELEASE
324.00 mg | DELAYED_RELEASE_TABLET | Freq: Two times a day (BID) | ORAL | Status: DC
Start: 2011-04-06 — End: 2011-06-12

## 2011-04-06 MED ORDER — FUROSEMIDE 20 MG TABLET
20.0000 mg | ORAL_TABLET | Freq: Every day | ORAL | Status: DC
Start: 2011-04-06 — End: 2011-04-06

## 2011-04-06 NOTE — Nurses Notes (Signed)
Patient gave instructions on discharge medications and follow up appointments. Patient and family verbalized understanding. Patient given awaiting Dr Francesco Runner from surgery to answer more questions. Will continue to monitor until patient leaves unit.     1130: Gave report to oncoming RN.

## 2011-04-06 NOTE — Progress Notes (Addendum)
 [facility]   CARDIAC SURGERY FLOOR/STEPDOWN PROGRESS NOTE      Boneta, Standre  Date of Admission:  03/31/2011  Date of Birth:  09/08/1966    Hospital Day:  LOS: 6 days   Post-op Day:  6 Days Post-Op, S/P CABG  Date of Service:  04/06/2011    SUBJECTIVE: Monique [name] is doing well today.  She states she is ready to go home.      OBJECTIVE:    Vital Signs:  Temp (24hrs) Max:36.9 C (98.4 F)      Temperature: 36.8 C (98.2 F) (04/05/11 2254)  BP (Non-Invasive): 135/59 mmHg (04/05/11 2257)  MAP (Non-Invasive): 84 mmHG (04/04/11 0840)  Heart Rate: 87  (04/05/11 2257)  Respiratory Rate: 18  (04/05/11 2254)  Pain Score: 4 (04/06/11 0636)  SpO2-1: 91 % (04/05/11 2254)    Base (Admission) Weight:  Base Weight (ADM): 112 kg (246 lb 14.6 oz)  Weight:  Weight: 126.281 kg (278 lb 6.4 oz)    Current Inpatient Medications:    Current facility-administered medications:  furosemide  (LASIX ) tablet  20 mg Oral Once   potassium chloride  (K-DUR) extended release tablet  10 mEq Oral Once   ferrous sulfate  324 mg (elemental IRON  65 mg) tab  324 mg Oral 2x/day   NS flush syringe  2 mL Intracatheter Q8HRS   NS flush syringe  2-6 mL Intracatheter Q1 MIN PRN   metoprolol  (LOPRESSOR ) tablet  12.5 mg Oral Q12H   amiodarone  (CORDARONE ) tablet  400 mg Oral 3x/day   insulin  lispro human (HUMALOG ) 100 units/mL injection  3 Units Subcutaneous 3x/day AC   SSIP insulin  lispro human (HUMALOG ) 100 units/mL injection  4-12 Units Subcutaneous 4x/day PRN   HYDROmorphone  (DILAUDID ) 1 mg/mL injection  0.2 mg Intravenous Q2H PRN   insulin  glargine (LANTUS ) 100 units/mL injection  25 Units Subcutaneous NIGHTLY   oxyCODONE -acetaminophen  (PERCOCET) 5-325mg  per tablet  1-2 Tab Oral Q4H PRN   docusate sodium  (COLACE) capsule  100 mg Oral 2x/day   sennosides-docusate sodium  (SENOKOT-S) 8.6-50mg  per tablet  1 Tab Oral NIGHTLY   prenatal vitamin-iron  fumarate-folic acid   (NATALCARE PLUS) 27-0.8mg  per tablet  1 Tab Oral Daily   aspirin   chewable tablet 81 mg 81 mg Oral Daily   simvastatin  (ZOCOR ) tablet  20 mg Oral QPM   heparin  5,000 unit/mL injection 5,000 Units 5,000 Units Subcutaneous Q8HRS   NS flush syringe  2 mL Intracatheter Q8HRS   NS flush syringe  2-6 mL Intracatheter Q1 MIN PRN       Appropriate Home Meds restarted:  Yes    I/O:  I/O last 24 hours to current time:    Intake/Output Summary (Last 24 hours) at 04/06/11 0936  Last data filed at 04/05/11 2258   Gross per 24 hour   Intake    540 ml   Output   1050 ml   Net   -510 ml       I/O last 3 completed shifts:  In: 900 [P.O.:900]  Out: 1050 [Urine:1050]    Nutrition/Diet:  CALORIE CONTROLLED    Labs:  Reviewed:  Lab Results for Last 24 Hours:    Results for orders placed during the hospital encounter of 03/31/11 (from the past 24 hour(s))   POCT WHOLE BLOOD GLUCOSE   Component Value Range   . GLUCOSE, POINT OF CARE 160 (*) 70 - 105 (mg/dL)   POCT WHOLE BLOOD GLUCOSE   Component Value Range   . GLUCOSE, POINT OF CARE 113 (*)  70 - 105 (mg/dL)   POCT WHOLE BLOOD GLUCOSE   Component Value Range   . GLUCOSE, POINT OF CARE 133 (*) 70 - 105 (mg/dL)   CBC   Component Value Range   . WBC 15.5 (*) 3.5 - 11.0 (THOU/uL)   . RBC 2.70 (*) 3.84 - 5.04 (MIL/uL)   . HGB 8.2 (*) 11.5 - 15.2 (g/dL)   . HCT 24.3 (*) 33.5 - 45.2 (%)   . MCV 89.9  82.0 - 99.0 (fL)   . MCH 30.3  27.4 - 33.0 (pg)   . MCHC 33.7  31.6 - 35.5 (g/dL)   . RDW 13.9  10.2 - 14.0 (%)   . PLATELET COUNT 450  140 - 450 (THOU/uL)   . MPV 6.2 (*) 7.4 - 10.4 (FL)   BASIC METABOLIC PANEL, NON-FASTING   Component Value Range   . SODIUM 134 (*) 136 - 145 (mmol/L)   . POTASSIUM 4.0  3.5 - 5.1 (mmol/L)   . CHLORIDE 101  96 - 111 (mmol/L)   . CARBON DIOXIDE 27  22 - 32 (mmol/L)   . ANION GAP 6  5 - 16 (mmol/L)   . CREATININE 0.88  0.49 - 1.10 (mg/dL)   . ESTIMATED GLOMERULAR FILTRATION RATE >59  >59 (ml/min/1.40m2)   . GLUCOSE,NONFAST 120  65 - 139 (mg/dL)   . BUN 18  6 - 20 (mg/dL)   . BUN/CREAT RATIO 20  6 - 22    . CALCIUM  7.6 (*) 8.5 - 10.4  (mg/dL)   POCT WHOLE BLOOD GLUCOSE   Component Value Range   . GLUCOSE, POINT OF CARE 128 (*) 70 - 105 (mg/dL)       Radiology:    Reviewed:  CXR:  Direct visualization of the image from 04/06/2011 showed  no significant effusion or pneumothorax, sternal wires intact     Physical Exam:    General: no distress  Lungs: Clear to auscultation bilaterally.   Cardiovascular: regular rate and rhythm  Abdomen: Soft, non-tender, non-distended  Extremities: No cyanosis or edema, bilateral LE incisions C/D/I  Skin: Skin warm and dry and sternal incision C/D/I, sternum stable  Neurologic: Grossly normal    ASSESSMENT/PLAN:  1. S/P CABG  2. DC home today    PT/OT: No    DVT RISK FACTORS HAVE BEN ASSESSED AND PROPHYLAXIS ORDERED (SEE RUBYONLINE - REFERENCE TOOLS - MD, DVT PROPHY OR POCKET CARD):  YES    Disposition Planning: Home discharge      Asberry Coder, PA-C 04/06/2011, 9:36 AM    Doing well. No symptoms from anemia. Will D/C to home.  I personally saw and examined the patient. See Midlevel's note for additional details.    Curtistine Massie Chiles, MD 04/06/2011, 1:16 PM

## 2011-04-06 NOTE — Discharge Summary (Addendum)
[facility]  DISCHARGE SUMMARY      PATIENT NAME:  Monique [name], Monique [name]  MRN:  [id number]  DOB:  1966-11-14    ADMISSION DATE:  03/31/2011  DISCHARGE DATE:  04/06/2011    ATTENDING PHYSICIAN: Ellery Plunk, MD  PRIMARY CARE PHYSICIAN: Tyron Russell, DO     ADMISSION DIAGNOSIS: CAD  DISCHARGE DIAGNOSIS:   S/P CABG  Active Non-Hospital Problems   Diagnoses Date Noted   . Tattoo    . 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 Carotid Artery Disease 03/18/2005   . Carotid Artery Disease 03/12/2004   . Diabetes Mellitus, Type II 03/24/2003   . Hypercholesterolemia 04/15/2002   . Hypertension 04/15/2002   . CVA 03/30/2001   . Stroke 03/30/2001        DISCHARGE MEDICATIONS:  Current Discharge Medication List      START taking these medications       amiodarone (PACERONE) 200 mg Oral Tablet    take 1 Tab by mouth Twice daily.    Qty: 30 Tab Refills: 0        docusate sodium (COLACE) 100 mg Oral Capsule    take 1 Cap by mouth Twice daily.    Qty: 30 Cap Refills: 0        ferrous sulfate (FERATAB) 324 mg (65 mg iron) Oral Tablet, Delayed Release (E.C.)    take 1 Tab by mouth Twice daily.    Qty: 60 Tab Refills: 1        prenatal vitamin-iron-folate (NATALCARE PLUS) Tablet    take 1 Tab by mouth Once a day.    Qty: 30 Tab Refills: 2        furosemide (LASIX) 20 mg Oral Tablet    take 1 Tab by mouth Twice daily.    Qty: 30 Tab Refills: 0        potassium chloride (K-DUR) 10 mEq Oral Tab Sust.Rel. Particle/Crystal    take 1 Tab by mouth Twice daily.    Qty: 30 Tab Refills: 0        oxycodone-acetaminophen (PERCOCET) 5-325 mg Oral Tablet    take 1-2 Tabs by mouth Every 4 hours as needed.    Qty: 50 Tab Refills: 0          CONTINUE these medications which have NOT CHANGED       simvastatin (ZOCOR) 20 mg Oral Tablet    take 20 mg by mouth QPM.        metoprolol (LOPRESSOR) 12.5 mg Oral Tablet    take 25 mg by mouth every 12 hours.        clopidogrel (PLAVIX) 75 mg Oral Tablet     take 75 mg by mouth Once a day.        Aspirin 81 mg Oral Tablet    take 81 mg by mouth Once a day.        glimepiride (AMARYL) 2 mg Oral Tablet    take 2 mg by mouth. Twice daily         metformin (GLUCOPHAGE) 500 mg Oral Tablet    take 500 mg by mouth Twice daily with food.        niacin (NIASPAN) 500 mg Oral Tablet Sustained Release    take 500 mg by mouth Once a day.        varenicline (CHANTIX) dose pak     take 1 Tab by mouth. Starter pack, take as directed.    Qty: 53 Tab Refills: 0  STOP taking these medications       atorvastatin (LIPITOR) 40 mg Oral Tablet    Comments:     Reason for Stopping:         Hydrocodone-Acetaminophen (NORCO) 7.5-325 mg Oral Tablet    Comments:     Reason for Stopping:             DISCHARGE INSTRUCTIONS:     XR CHEST PA AND LATERAL   Please schedule with return to [facility] clinic visit   Ordering Provider IRWIN, REBECCA [[id number]]      DISCHARGE INSTRUCTION - DIET - Cardiac Diet 1800 cal ADA   Diet: CARDIAC DIET 1800 cal ADA     DISCHARGE INSTRUCTION - ACTIVITY - Sternal Precautions   Activity: STERNAL PRECAUTIONS    Activity: NO LIFTING OVER 10 POUNDS FOR 6 WEEKS    Activity: NO DRIVING FOR 4 WEEKS    Activity: NO DRIVING WHILE TAKING NARCOTICS      DISCHARGE INSTRUCTION - INCISION/WOUND CARE   Instructions for incison/wound care: KEEP INCISION CLEAN AND DRY    Instructions for incison/wound care: MAY SHOWER, NO TUB BATHS OR SWIMMING      SCHEDULE FOLLOW-UP - CARDIAC SURGERY - [place] - [place] -- 2 weeks   Schedule CXR adn EKG same day as follow up appt.   Follow-up in: 2 WEEKS    Reason for visit: HOSPITAL DISCHARGE    Followup reason: s/p cabg    Provider: Iona Coach, MD    Coordination with other outpt. appts.? YES (provide details in comment section & place separate orders for those items)      SCHEDULE FOLLOW-UP - CARDIOLOGY - [place]   Follow-up in: 4 WEEKS    Reason for visit: HOSPITAL DISCHARGE    Followup reason: s/p cath, s/p cabg     Provider: Carmie End, MD      CARDIAC REHAB PHASE II   Indication for Therapy S/P CABG    Method to Determine THR for Program Duration TO BE DETERMINED BY THE CARDIAC REHAB STAFF    Exercise Intensity or MET Level TO BE DETERMINED BY CARDIAC REHAB STAFF    Assign Risk Stratification (ACSM Guidelines) LOW RISK      ECG 12-LEAD   Schedule with return to SCT clinic   Reason for Exam S/P CARDIAC SURGERY      DME - HOSPITAL BED   Ht 175.3 cm    Wt 121.9 kg    Current Attending: Dr Francesco Runner    Medical Condition or Diagnosis which is primary reason for equipment: S/P CABG, sternal precautions.  Has waterbed, not able to follow precations with this type of bed.      I certify that a hospital bed is necessary due to Pt requires HOB elevated >30 degrees most of time r/t CHF,COPD,or aspiration. Pillows/wedges MUST have been considered & ruled out.    Bed Type Fixed Height      DME - HOME OXYGEN   Study used to evaluate oxygen saturation at rest vs exercise was performed during an inpatient hospital stay within 2 days prior to discharge date and was the last test performed prior to discharge and met the following criteria:   Study performed at rest without oxygen.  Study performed during exercise without oxygen.  Study performed during exercise with oxygen applied that demonstrates improvement.   Ht 175.3 cm    Wt 123 kg    Current Attending: Iona Coach, MD    Medical Condition or Diagnosis  which is primary reason for equipment: Oxygen saturation 86% on room air    LITER FLOW AND TIME 2 liters continuous    RA O2 Sats at rest with date/time 86% 4/27 at 3:50 PM    I certify that the patient has had a blood gas study and home oxygen is necessary due to the following condition At rest, (awake but sitting or lying down) O2 sat is at or below 88%    Physician has determined that patient has a severe lung disease or hypoxia related symptoms that will improve with oxygen Yes    O2 sats at rest WITHOUT oxygen? 86%         REASON FOR HOSPITALIZATION AND HOSPITAL COURSE:  This is a 45 y.o., female who was referred to cardiac surgery after having a cardiac catheterization due to symptoms of dyspnea.  She was found to have multivessel CAD and a cardiac surgery consult was obtained during her cath admission.  The patient was worked up for surgery and was discharged home.  On 03/31/11 the patient was readmitted and underwent CABG x 2 (LIMA to the LAD, RSVG to the OM2).  She tolerated this procedure well.  On POD#0 the patient was treated for a postop coagulopathy.  On POD #1 a left chest tube was placed postoperatively for pleural effusion and 1200 mL of serosanguinous fluid was obtained.  The patient was also able to be extubated on POD #1.  On POD #2 the patient's mediastinal chest tubes and pacing wires were removed without difficulty.  Ms. Ambrocio was made floor status but was not able to be transferred secondary to lack of beds.  On POD #3 the patient's pleural chest tubes were removed without complication and she was physically transferred to the floor with telemetry.  The patient remained in stable condition.  She was evaluated for home oxygen and this was arranged by care management.  On POD #6 Ms. Skufca was hemodynamically stable and suitable for discharge.  She is to follow up with Dr. Francesco Runner in 2 weeks and with Dr. Caralyn Guile in 4 weeks.  She is being discharged on home oxygen at 2L NC continuously.    CONDITION ON DISCHARGE:  A. Ambulation: independent  B. Self-care Ability: full  C. Cognitive Status A&O x 3    DISCHARGE DISPOSITION:  Home discharge     cc: Primary Care Physician:  Tyron Russell, DO  [address]  [place] [address]     [id number] Physician:  No referring provider defined for this encounter.     Florence Canner, PA-C    Ellery Plunk, MD 04/06/2011, 1:14 PM

## 2011-04-06 NOTE — Nurses Notes (Signed)
24 Hour Note:  45 yo admitted for a scheduled CABG on 4/23. POD # 6 with possible discharge to home today. Foley and chest tubes were d/c on 4/26. Pt is voiding adequately. Pt being weaned off of oxygen, now on 1/2 liters and sats remaining above 91%. 4/27 - chest xray showed slight left basilar haziness, no pneumothorax. Pt stands with an assist of one from sitting position, and ambulates in hallway with husband frequently. Pt alert and oriented, BP 135/59   Pulse 87   Temp 36.8 C (98.2 F)   Resp 18   Ht 1.753 m (5\' 9" )   Wt 126.281 kg (278 lb 6.4 oz)   BMI 41.11 kg/m2   SpO2 91%  Denies pain or shortness of breath at this time and full assessment placed in [place]. Patient resting with eyes closed. Arousable to tactile/verbal stimuli. Encouraged patient to call if assistance needed, call bell within reach. Patient verbalized understanding.

## 2011-04-06 NOTE — Nurses Notes (Signed)
Pt given discharge instructions, pt verbalized understanding of prescriptions, activity/care and upcoming appointments, pt has no questions or concerns at this time, IV removed with catheter tip intact, pt having some incisional pain Monique Canner, PA, informed RN okay to give pt Pe dose 1 hour early before she leaves and she removed pt's Prevena Wound Vac at bedside, pt went downstairs on wheelchair with husband who will drive her home.

## 2011-04-09 ENCOUNTER — Telehealth (HOSPITAL_BASED_OUTPATIENT_CLINIC_OR_DEPARTMENT_OTHER): Payer: Self-pay | Admitting: Surgical

## 2011-04-09 NOTE — Telephone Encounter (Signed)
 Received a call from patient's husband with concerns of shortness of breath.  Monique [name] was sent home with home oxygen.  Per the patient's husband she is unable to walk even a short distance without getting short of breath.  He states she still has swelling in bilateral LE and is easily fatigued.  Upon review of the patient's last CXR, a significant left pleural effusion is present.  I discussed possible options for treatment of this effusion with the patient's husband (medical management vs. Thoracentesis) and set the patient up to see Dr. Quintin in clinic tomorrow afternoon.  The patient and husband are agreeable to this plan.    Monique Coder, PA-C 04/09/2011, 5:10 PM

## 2011-04-10 ENCOUNTER — Ambulatory Visit
Admission: RE | Admit: 2011-04-10 | Discharge: 2011-04-10 | Disposition: A | Discharge status: Home / Self Care | Payer: Medicare Other | Source: Ambulatory Visit | Attending: CARDIAC SURGERY-RUBY | Admitting: CARDIAC SURGERY-RUBY

## 2011-04-10 ENCOUNTER — Ambulatory Visit (HOSPITAL_BASED_OUTPATIENT_CLINIC_OR_DEPARTMENT_OTHER): Payer: Medicare Other | Admitting: CARDIAC SURGERY-RUBY

## 2011-04-10 ENCOUNTER — Encounter (HOSPITAL_BASED_OUTPATIENT_CLINIC_OR_DEPARTMENT_OTHER): Payer: Self-pay | Admitting: CARDIAC SURGERY-RUBY

## 2011-04-10 ENCOUNTER — Ambulatory Visit (HOSPITAL_BASED_OUTPATIENT_CLINIC_OR_DEPARTMENT_OTHER)
Admission: RE | Admit: 2011-04-10 | Discharge: 2011-04-10 | Disposition: A | Discharge status: Home / Self Care | Payer: Medicare Other | Source: Ambulatory Visit | Attending: CARDIAC SURGERY-RUBY | Admitting: CARDIAC SURGERY-RUBY

## 2011-04-10 VITALS — BP 132/62 | HR 85 | Temp 98.6°F | Resp 16 | Ht 69.0 in | Wt 272.5 lb

## 2011-04-10 DIAGNOSIS — Z951 Presence of aortocoronary bypass graft: Secondary | ICD-10-CM

## 2011-04-10 DIAGNOSIS — K219 Gastro-esophageal reflux disease without esophagitis: Secondary | ICD-10-CM | POA: Insufficient documentation

## 2011-04-10 DIAGNOSIS — Z87891 Personal history of nicotine dependence: Secondary | ICD-10-CM | POA: Insufficient documentation

## 2011-04-10 DIAGNOSIS — I6529 Occlusion and stenosis of unspecified carotid artery: Secondary | ICD-10-CM | POA: Insufficient documentation

## 2011-04-10 DIAGNOSIS — R0602 Shortness of breath: Secondary | ICD-10-CM | POA: Insufficient documentation

## 2011-04-10 DIAGNOSIS — R609 Edema, unspecified: Secondary | ICD-10-CM | POA: Insufficient documentation

## 2011-04-10 DIAGNOSIS — Z8673 Personal history of transient ischemic attack (TIA), and cerebral infarction without residual deficits: Secondary | ICD-10-CM | POA: Insufficient documentation

## 2011-04-10 DIAGNOSIS — J90 Pleural effusion, not elsewhere classified: Secondary | ICD-10-CM | POA: Insufficient documentation

## 2011-04-10 DIAGNOSIS — I2510 Atherosclerotic heart disease of native coronary artery without angina pectoris: Secondary | ICD-10-CM | POA: Insufficient documentation

## 2011-04-10 DIAGNOSIS — E119 Type 2 diabetes mellitus without complications: Secondary | ICD-10-CM | POA: Insufficient documentation

## 2011-04-10 DIAGNOSIS — J9819 Other pulmonary collapse: Secondary | ICD-10-CM | POA: Insufficient documentation

## 2011-04-10 DIAGNOSIS — E785 Hyperlipidemia, unspecified: Secondary | ICD-10-CM | POA: Insufficient documentation

## 2011-04-10 DIAGNOSIS — I10 Essential (primary) hypertension: Secondary | ICD-10-CM | POA: Insufficient documentation

## 2011-04-10 DIAGNOSIS — R635 Abnormal weight gain: Secondary | ICD-10-CM | POA: Insufficient documentation

## 2011-04-10 MED ORDER — METOLAZONE 5 MG TABLET
5.0000 mg | ORAL_TABLET | Freq: Every day | ORAL | Status: DC
Start: 2011-04-10 — End: 2011-05-21

## 2011-04-10 MED ORDER — FUROSEMIDE 20 MG TABLET
20.0000 mg | ORAL_TABLET | Freq: Two times a day (BID) | ORAL | Status: DC
Start: 2011-04-10 — End: 2011-05-21

## 2011-04-10 NOTE — Progress Notes (Signed)
Subjective:     Patient is a 45 y.o. Caucasian female who was discharged on 04/06/2011 after undoing CABGx2 on 03/31/2011.  Patient called with complaints of shortness of breath, weight gain and lower extremity edema.  According to the patient's husband, patient has gained 38 lbs since discharge.  Denies chest pain.  She complains of seepage from RLE harvest site which she reports requires frequent bandage changes during the day.  Patient reports that she was given Lasix 20 mg daily by the pharmacy despite Rx for 20 mg BID per our records.  She reports that she is using the oxygen only when she needs it.  She reports that she can only ambulate from the living room to the bathroom without getting short of breath.         Patient Active Problem List   Diagnoses Date Noted   . Tattoo [709.09AF]    . Xanthelasma A9024582.2AF] 03/19/2011   . Carotid Artery Disease [433.11] 03/18/2005   . Carotid Artery Disease [433.10] 03/12/2004   . Diabetes Mellitus, Type II [250.00A] 03/24/2003   . Hypercholesterolemia [272.0] 04/15/2002   . Hypertension [401.9] 04/15/2002   . CVA [434.91] 03/30/2001   . Stroke [436] 03/30/2001       Past Medical History   Diagnosis Date   . Diabetes mellitus type II    . Hyperlipidemia    . HTN    . Tattoo      Left breast, Right shoulder   . Ear piercing    . Coronary artery disease    . Hypertension    . Stroke      2002   . Neck problem      herniated cervical disc   . GERD (gastroesophageal reflux disease)    . Obesity    . Type 2 diabetes mellitus      Dx 1999 average fasting 70-89        Past Surgical History   Procedure Date   . Hx cholecystectomy    . Hx tonsillectomy    . Hx cataract removal      r and l eyes with implants   . Hx cesarean section      x 3   . Hx ankle fracture tx      Right         Cannot display prior to admission medications because the patient has not been admitted in this contact.        Allergies   Allergen Reactions   . No Known Drug Allergies         History    Substance Use Topics   . Smoking status: Former Smoker -- 0.2 packs/day for 20 years     Types: Cigarettes     Quit date: 03/30/2011   . Smokeless tobacco: Never Used   . Alcohol Use: No        Family History   Problem Relation Age of Onset   . Diabetes Mother    . Heart Attack Mother    . Hypertension Mother         Review of Systems  Other than ROS in the HPI, all other systems were negative.    Objective:     @IPVITALS [8@  BP 132/62   Pulse 85   Temp(Src) 37 C (98.6 F) (Tympanic)   Resp 16   Ht 1.753 m (5\' 9" )   Wt 123.6 kg (272 lb 7.8 oz)   BMI 40.24 kg/m2   SpO2 95%  General appearance: alert, cooperative, no distress, appears stated age  Lungs: clear to auscultation bilaterally  Heart: regular rate and rhythm, S1, S2 normal, no murmur, click, rub or gallop  Extremities: +2 pitting edema, eccymosis of RLE with +serosanginous fluid from RLE harvest site, no erythema, LLE harvest site intact without erythema/drainage  Chest:  Sternum stable, no erythema/drainage      Assessment/Plan:     1.  S/P CABGx2, start Lasix 20 mg BID and Zaroxyln 5 mg daily for three days then every other day, #10, Rx's given, CBC, BMP, PA/Lateral CXR and f/u in one week.  Patient to wear oxygen 24 hours daily.    2.  RTC in one week    Gayla Medicus, PA-C 04/10/2011, 2:39 PM    Will increase diuresis and reevaluate patient in 1 week. Will check CBC, BMP, and CXR next week.  I personally saw and examined the patient. See Midlevel's note for additional details.    Ellery Plunk, MD 04/10/2011, 2:51 PM

## 2011-04-16 ENCOUNTER — Ambulatory Visit (HOSPITAL_BASED_OUTPATIENT_CLINIC_OR_DEPARTMENT_OTHER): Payer: Medicare Other | Admitting: CARDIAC SURGERY-RUBY

## 2011-04-16 ENCOUNTER — Ambulatory Visit
Admission: RE | Admit: 2011-04-16 | Discharge: 2011-04-16 | Disposition: A | Discharge status: Home / Self Care | Payer: Medicare Other | Source: Ambulatory Visit | Attending: CARDIAC SURGERY-RUBY | Admitting: CARDIAC SURGERY-RUBY

## 2011-04-16 ENCOUNTER — Encounter (HOSPITAL_BASED_OUTPATIENT_CLINIC_OR_DEPARTMENT_OTHER): Payer: Self-pay | Admitting: CARDIAC SURGERY-RUBY

## 2011-04-16 ENCOUNTER — Ambulatory Visit (HOSPITAL_BASED_OUTPATIENT_CLINIC_OR_DEPARTMENT_OTHER)
Admission: RE | Admit: 2011-04-16 | Discharge: 2011-04-16 | Disposition: A | Discharge status: Home / Self Care | Payer: Medicare Other | Source: Ambulatory Visit | Attending: WVUHI CARDIAC SURGERY-STC | Admitting: WVUHI CARDIAC SURGERY-STC

## 2011-04-16 DIAGNOSIS — K219 Gastro-esophageal reflux disease without esophagitis: Secondary | ICD-10-CM | POA: Insufficient documentation

## 2011-04-16 DIAGNOSIS — E669 Obesity, unspecified: Secondary | ICD-10-CM | POA: Insufficient documentation

## 2011-04-16 DIAGNOSIS — Z6837 Body mass index (BMI) 37.0-37.9, adult: Secondary | ICD-10-CM | POA: Insufficient documentation

## 2011-04-16 DIAGNOSIS — Z951 Presence of aortocoronary bypass graft: Secondary | ICD-10-CM | POA: Insufficient documentation

## 2011-04-16 DIAGNOSIS — Z8673 Personal history of transient ischemic attack (TIA), and cerebral infarction without residual deficits: Secondary | ICD-10-CM | POA: Insufficient documentation

## 2011-04-16 DIAGNOSIS — E119 Type 2 diabetes mellitus without complications: Secondary | ICD-10-CM | POA: Insufficient documentation

## 2011-04-16 DIAGNOSIS — I10 Essential (primary) hypertension: Secondary | ICD-10-CM | POA: Insufficient documentation

## 2011-04-16 DIAGNOSIS — J9819 Other pulmonary collapse: Secondary | ICD-10-CM | POA: Insufficient documentation

## 2011-04-16 DIAGNOSIS — I2510 Atherosclerotic heart disease of native coronary artery without angina pectoris: Secondary | ICD-10-CM | POA: Insufficient documentation

## 2011-04-16 DIAGNOSIS — Z87891 Personal history of nicotine dependence: Secondary | ICD-10-CM | POA: Insufficient documentation

## 2011-04-16 LAB — CBC
HCT: 32.3 % — ABNORMAL LOW (ref 33.5–45.2)
HGB: 10.3 g/dL — ABNORMAL LOW (ref 11.5–15.2)
MCH: 29 pg (ref 27.4–33.0)
MCHC: 31.9 g/dL (ref 31.6–35.5)
MCV: 90.9 fL (ref 82.0–99.0)
MPV: 5.6 FL — ABNORMAL LOW (ref 7.4–10.4)
PLATELET COUNT: 1197 THOU/uL — ABNORMAL HIGH (ref 140–450)
RBC: 3.55 MIL/uL — ABNORMAL LOW (ref 3.84–5.04)
RDW: 14.5 % — ABNORMAL HIGH (ref 10.2–14.0)
WBC: 14 THOU/uL — ABNORMAL HIGH (ref 3.5–11.0)

## 2011-04-16 LAB — BASIC METABOLIC PANEL
ANION GAP: 10 mmol/L (ref 5–16)
BUN/CREAT RATIO: 12 (ref 6–22)
BUN: 13 mg/dL (ref 6–20)
CALCIUM: 8.7 mg/dL (ref 8.5–10.4)
CARBON DIOXIDE: 32 mmol/L (ref 22–32)
CHLORIDE: 95 mmol/L — ABNORMAL LOW (ref 96–111)
CREATININE: 1.08 mg/dL (ref 0.49–1.10)
ESTIMATED GLOMERULAR FILTRATION RATE: 55 ml/min/1.73m2 — ABNORMAL LOW (ref >59)
GLUCOSE,NONFAST: 145 mg/dL — ABNORMAL HIGH (ref 65–139)
POTASSIUM: 4.1 mmol/L (ref 3.5–5.1)
SODIUM: 137 mmol/L (ref 136–145)

## 2011-04-16 MED ORDER — METOPROLOL TARTRATE 12.5 MG HALF TABLET
25.0000 mg | ORAL_TABLET | Freq: Two times a day (BID) | ORAL | Status: DC
Start: 2011-04-16 — End: 2011-05-21

## 2011-04-16 NOTE — Progress Notes (Signed)
Subjective:       Monique [name]  Returns to [facility] surgery clinic.  Pt was seen in [facility] clinic last week for follow up from her CABG procedure performed on 03/31/2011.  Pt was placed on diuretic therapy for a week secondary to fluid overloaded per physical exam and CXR.  Pt has lost over 20lbs since last visit since being aggressively diuresed.  Pt states her LE edema has improved and that she is able ambulate longer distances without fatigue.        Patient Active Problem List   Diagnoses Date Noted   . S/P CABG x 2 [V45.81AU] 04/10/2011   . Tattoo [709.09AF]    . Xanthelasma A9024582.2AF] 03/19/2011   . Carotid Artery Disease [433.11] 03/18/2005   . Carotid Artery Disease [433.10] 03/12/2004   . Diabetes Mellitus, Type II [250.00A] 03/24/2003   . Hypercholesterolemia [272.0] 04/15/2002   . Hypertension [401.9] 04/15/2002   . CVA [434.91] 03/30/2001   . Stroke [436] 03/30/2001       Past Medical History   Diagnosis Date   . Diabetes mellitus type II    . Hyperlipidemia    . HTN    . Tattoo      Left breast, Right shoulder   . Ear piercing    . Coronary artery disease    . Hypertension    . Stroke      2002   . Neck problem      herniated cervical disc   . GERD (gastroesophageal reflux disease)    . Obesity    . Type 2 diabetes mellitus      Dx 1999 average fasting 70-89        Past Surgical History   Procedure Date   . Hx cholecystectomy    . Hx tonsillectomy    . Hx cataract removal      r and l eyes with implants   . Hx cesarean section      x 3   . Hx ankle fracture tx      Right         Cannot display prior to admission medications because the patient has not been admitted in this contact.        Allergies   Allergen Reactions   . No Known Drug Allergies         History   Substance Use Topics   . Smoking status: Former Smoker -- 0.2 packs/day for 20 years     Types: Cigarettes     Quit date: 03/30/2011   . Smokeless tobacco: Never Used   . Alcohol Use: No        Family History   Problem Relation Age of Onset    . Diabetes Mother    . Heart Attack Mother    . Hypertension Mother         Review of Systems    Constitutional: negative  Respiratory: negative  Cardiovascular: negative  Gastrointestinal: negative      Objective:     BP 100/62   Pulse 98   Ht 1.753 m (5\' 9" )   Wt 114.2 kg (251 lb 12.3 oz)   BMI 37.18 kg/m2   SpO2 94%  Physical Exam:  General appearance: Appears healthy.  Alert; in no acute distress.  Pleasant.  Cardiac: PMI normal. No lifts, heaves, or thrills. RRR. No murmurs, clicks, gallops, or rubs    Sternum stable   Respiratory: Respiratory effort normal  Abdomen: soft, non-tender. Bowel sounds  normal. No masses,  no organomegaly  Extremities: no cyanosis, clubbing or edema present    Surgical Incisions:   Chest: well healing, without drainage  Extremities:well healing, without drainage    Assessment:     SP CABG    Plan:     RTC PRN  Complete zaroxolyn and amiodarone  Increases metoprolol to 25 BID    Versie Starks Abeni Finchum, PA-C 04/16/2011, 9:17 AM    Doing better. Will finished amiodarone and Zaroxlyn. Follow up with cardiology.  I personally saw and examined the patient. See Midlevel's note for additional details.    Ellery Plunk, MD 04/16/2011, 10:09 AM

## 2011-04-18 ENCOUNTER — Emergency Department (HOSPITAL_COMMUNITY)
Admission: EM | Admit: 2011-04-18 | Discharge: 2011-04-18 | Discharge status: Against Medical Advice | Payer: Managed Care, Other (non HMO) | Attending: Emergency Medicine | Admitting: Emergency Medicine

## 2011-04-18 DIAGNOSIS — R109 Unspecified abdominal pain: Secondary | ICD-10-CM | POA: Insufficient documentation

## 2011-04-18 LAB — URINALYSIS, ROUTINE W REFLEX MICROSCOPIC
Glucose, UA: NEGATIVE mg/dL
Hgb urine dipstick: NEGATIVE
Specific Gravity, Urine: 1.035 — ABNORMAL HIGH (ref 1.005–1.030)
pH: 5 (ref 5.0–8.0)

## 2011-04-22 NOTE — Assessment & Plan Note (Signed)
[facility]                          [facility] OFFICE NOTE   Ann [name], Ann [name]                      MRN:          [id number]  DATE:09/19/2008                            DOB:          10/21/1966    REFERRING PHYSICIAN:  Donzetta Sprung, MD   REASON FOR REFERRAL:  Chest pain.   HISTORY OF PRESENT ILLNESS:  Ann [name] is a 45 year old female patient  with a long history of chest pain who was diagnosed with mitral valve  prolapse some 20 years ago.  She has recently developed worsening  symptoms.  Her primary care physician arranged stress testing.  She had  a stress Cardiolite done on August 07, 2008.  She exercised for 9  minutes and achieved 10.1 METS.  She had normal elevations in her blood  pressure and heart rate.  Her perfusion was normal and her EF was 62% on  scintigraphic imaging.  She has been referred for further evaluation of  her chest symptoms.   As noted above, the patient has had chest symptoms for many years.  This  has been fairly consistent.  She was prescribed some medication many  years ago, but did not take it because it did not seem to help.  Her  husband was involved in a motorcycle wreck a few months ago.  Around  that same time, she noted that her chest symptoms worsened.  She also  describes a myriad of other symptoms that seem to be somewhat related to  this.  These symptoms include leg cramping, ankle swelling, flushing,  dizziness, and hand swelling.  Her right arm feels heavy.  This is a  constant symptom and there is some tingling in her fingers as well.  Nothing makes this worse or better.  She denies any exertional chest  discomfort.  Her chest discomfort that comes on from time to time is a  stabbing discomfort.  It is pleuritic.  It does make her more short of  breath.  She notes symptoms for probably 20-30 minutes at times.  Nothing she does makes it any better and nothing she does makes it any  worse.  She  does feel somewhat lightheaded.  She denies syncope.  She  denies nausea but has noted some diaphoresis.  Of note, she has begun to  notice palpitations over the last couple of months as well.  This seems  to be a new symptom for her.  She is not sure that is related to her  other symptoms.   PAST MEDICAL HISTORY:  As outlined above.  She denies any history of  hypertension, diabetes mellitus, or hyperlipidemia.  She does have  lumbar degenerative disk disease.  She has a history of  anxiety/depression.   She denies any surgical history.   MEDICATIONS:  Xanax p.r.n.   ALLERGIES:  No known drug allergies.   SOCIAL HISTORY:  She quit smoking cigarettes about 6 months ago.  She  smoked about a half-pack per day for 20 years and her husband smokes on  a regular basis.  She denies alcohol abuse.  She works  as a Psychologist, clinical.  She lives in [place].   FAMILY HISTORY:  Significant for coronary artery disease.  Her father  died of a heart attack at age 36.  Most of his brothers also died of a  heart attack.   REVIEW OF SYSTEMS:  Please see HPI.  She denies fevers or chills.  She  does have a mild cough.  This is nonproductive.  She notes quite a bit  of fatigue.  Her husband notes that she snores.  No apneic episodes have  been noted.  She denies fevers, chills, melena, hematochezia, hematuria,  or dysuria.  Rest of the review of systems are negative.   PHYSICAL EXAMINATION:  GENERAL:  She is a well-nourished, well-developed  female in no distress.  VITAL SIGNS:  Blood pressure is 119/80, pulse 88, weight 124.4 pounds.  HEENT:  Normal.  NECK:  Without JVD.  LYMPH:  Without lymphadenopathy.  ENDOCRINE:  Without thyromegaly.  CARDIAC:  Normal S1 and S2, regular rate and rhythm.  No clicks,  murmurs, rubs, or gallops.  LUNGS:  With scant rhonchi noted at the bases.  No rales, no wheezing.  ABDOMEN:  Soft, nontender.  EXTREMITIES:  Without edema.  SKIN:  Warm and dry.   MUSCULOSKELETAL:  Without joint deformity.  VASCULAR:  Without carotid bruits bilaterally.  Dorsalis pedis and  posterior tibialis pulses 2+ bilaterally.   DATABASE LABORATORIES:  Done in August from Dr. Reuel Boom demonstrate a  creatinine of 0.56, potassium of 4.9, albumin 4.4, hemoglobin 12.8, TSH  1.17.   ASSESSMENT AND PLAN:  Chest pain and palpitations in a 45 year old  female patient with reported history of mitral valve prolapse.  As  outlined above, she has a myriad of symptoms associated with her chest  pain and palpitations.  She has had a recent Cardiolite test that shows  no ischemia and good left ventricular function.  Since it has been  several years since she has had an echocardiogram, this will be arranged  to reassess her mitral valve.  We will also set her up for a 21-day  event monitor to rule out significant tachyarrhythmias as a cause for  her symptoms.  The patient was also interviewed and examined by Dr.  Diona Browner today.  We discussed briefly the possibility of testing her for  pheochromocytoma, although this seems very unlikely.  We will hold off  on that at this point until we obtain further information.  It may well  be that stress is contributing significantly to her symptom complex.   DISPOSITION:  The patient will be brought back in followup in the next 4-  6 weeks with Dr. Diona Browner, sooner p.r.n.      Tereso Newcomer, PA-C  Electronically Signed      Jonelle Sidle, MD  Electronically Signed   SW/MedQ  DD: 09/19/2008  DT: 09/20/2008  Job #: [id number]   cc:   Donzetta Sprung

## 2011-04-23 ENCOUNTER — Ambulatory Visit (HOSPITAL_BASED_OUTPATIENT_CLINIC_OR_DEPARTMENT_OTHER): Payer: Medicare Other

## 2011-04-23 ENCOUNTER — Encounter (HOSPITAL_BASED_OUTPATIENT_CLINIC_OR_DEPARTMENT_OTHER): Payer: Self-pay | Admitting: CARDIAC SURGERY-RUBY

## 2011-04-30 ENCOUNTER — Telehealth (HOSPITAL_BASED_OUTPATIENT_CLINIC_OR_DEPARTMENT_OTHER): Payer: Self-pay | Admitting: WVUHI CARDIAC SURGERY-STC

## 2011-04-30 NOTE — Telephone Encounter (Signed)
Spoke with Mr. Pedro husband regarding her medications.  Her LE edema has improved dramatically.  He has questions about whether or not she needs to continue taking Lasix, K+ and Zaroxyln.  Instructed patient to reduce Lasix to once daily dosage and to discontinue Lasix and K+ once pills gone.  Patient scheduled to see Dr. Caralyn Guile in three weeks.    Gayla Medicus, PA-C 04/30/2011, 3:41 PM

## 2011-05-21 ENCOUNTER — Ambulatory Visit: Payer: Medicare Other

## 2011-05-21 ENCOUNTER — Encounter (HOSPITAL_BASED_OUTPATIENT_CLINIC_OR_DEPARTMENT_OTHER): Payer: Self-pay

## 2011-05-21 VITALS — BP 110/68 | HR 100 | Temp 97.9°F | Ht 69.0 in | Wt 242.5 lb

## 2011-05-21 DIAGNOSIS — F39 Unspecified mood [affective] disorder: Secondary | ICD-10-CM | POA: Insufficient documentation

## 2011-05-21 DIAGNOSIS — I6529 Occlusion and stenosis of unspecified carotid artery: Secondary | ICD-10-CM | POA: Insufficient documentation

## 2011-05-21 DIAGNOSIS — I739 Peripheral vascular disease, unspecified: Secondary | ICD-10-CM | POA: Insufficient documentation

## 2011-05-21 DIAGNOSIS — E119 Type 2 diabetes mellitus without complications: Secondary | ICD-10-CM | POA: Insufficient documentation

## 2011-05-21 DIAGNOSIS — E785 Hyperlipidemia, unspecified: Secondary | ICD-10-CM | POA: Insufficient documentation

## 2011-05-21 DIAGNOSIS — Z951 Presence of aortocoronary bypass graft: Secondary | ICD-10-CM | POA: Insufficient documentation

## 2011-05-21 DIAGNOSIS — I2510 Atherosclerotic heart disease of native coronary artery without angina pectoris: Secondary | ICD-10-CM | POA: Insufficient documentation

## 2011-05-21 DIAGNOSIS — Z7982 Long term (current) use of aspirin: Secondary | ICD-10-CM | POA: Insufficient documentation

## 2011-05-21 MED ORDER — METOPROLOL TARTRATE 50 MG TABLET
50.00 mg | ORAL_TABLET | Freq: Two times a day (BID) | ORAL | Status: DC
Start: 2011-05-21 — End: 2011-07-02

## 2011-05-21 MED ORDER — ATORVASTATIN 80 MG TABLET
80.0000 mg | ORAL_TABLET | Freq: Every day | ORAL | Status: DC
Start: 2011-05-21 — End: 2012-05-18

## 2011-05-21 MED ORDER — PRENATAL VIT-IRON-FOLATE TAB WRAPPER
1.0000 | ORAL_TABLET | Freq: Every day | Status: DC
Start: 2011-05-21 — End: 2011-06-18

## 2011-05-21 NOTE — Patient Instructions (Signed)
Increase metoprolol to 50 mg twice daily  Heart ultrasound  Fasting blood work soon    Please call us if you have problems or questions.     [facility] Call Center:       Phone:  [phone]      Toll free: [phone]  [877 "[facility]"]     These numbers are answered from about 7:30 AM - 5:30 PM Monday-Friday.   If you call after hours, there should be an option to leave a message or call   another number, through which you should be able to reach [place].

## 2011-05-21 NOTE — Progress Notes (Signed)
See dictated note.    KK

## 2011-05-22 ENCOUNTER — Encounter (HOSPITAL_BASED_OUTPATIENT_CLINIC_OR_DEPARTMENT_OTHER): Payer: Self-pay

## 2011-05-22 NOTE — Progress Notes (Signed)
 Katona, Kassandra A, PA-C         Sent: Thu May 22, 2011  9:56 AM     To: Adrien Kansky, LPN                Message      Her HR was high so she isn't adequately BB.  I thought I told her, but we talked about a lot of different things.  I dictated this yesterday, the dictation should be up soon :)    Pt notified, expressed understanding.    ----- Message -----     From: Kansky Adrien, LPN     Sent: 3/85/7987   8:52 AM       To: Kassandra A Katona, PA-C    Pt asking why Lopressor  was increased to 50mg  bid.  Debby  ----- Message -----     From: Jerrye Frieze     Sent: 05/22/2011   8:39 AM       To: [facility]         Verneita MACARIO Lunger   45 y.o. / Female (10-May-1966)    MRN: [id number]   PCP: Malena Furry, DO   Wt: 110 kg (242 lb 8.1 oz) (05/21/2011)    Home: [phone]   Work: [phone]   Mobile: [phone]           Documentation                >> ERICA FOSTER 05/22/2011 08:39 AM  The patient is calling today because she was seen yesterday and when she went to the pharmacy her prescriptions were changed and she is unsure as to what she should be taking.  She would like to speak with someone regarding clarification on her prescriptions.  Patient of Dr. Minerva.                   Call History            Type Contact Phone User     05/22/2011  8:38 AM Phone (Incoming) Emmani, Lesueur Wall (Self) [phone] BENNIE) Jerrye Frieze                Status Change Notification         Status From

## 2011-05-22 NOTE — Progress Notes (Addendum)
 [facility]  [address]  [place], [place] [address]    PROGRESS NOTE    PATIENT NAME: Monique [name], Monique [name]  CHART NUMBER: [id number]  DATE OF BIRTH: [birth year]  DATE OF SERVICE: 05/21/2011    PRIMARY CARE PHYSICIAN:  Trinidad Nani, MD.     CHIEF COMPLAINT:  Follow up CABG.    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  She presented here for a second opinion was evaluating more could be done.  She did report some mild shortness of breath with activity.  She underwent carotid angiography as well as coronary angiography and was found to have multivessel coronary artery disease and referred for coronary artery bypass graft surgery.  This took place on March 31, 2011.  She had LIMA to LAD and a vein graft to OM2.  The patient did well aside from some lower extremity swelling in the postoperative timeframe.  She is doing well and is no longer on any diuretics.  She is not having any further swelling and is largely improved.  She does have some localized 3 cm collection of cystic underneath of her right superior Endovein incision aside from her knee.  She notes that about a week ago  she has not had any fevers, chills, sweats or drainage from the incision.  She otherwise voices no complaints.    MEDICATIONS:  1.  Aspirin  81 mg daily.  2.  Iron .   3.  Amaryl.   4.  Glucophage .  5.  Lopressor  25 twice daily.  6.  Niaspan .   7.  Prenatal vitamins.   8.  Zocor  20 mg daily.  9.  Januvia.    OBJECTIVE:  The blood pressure is 110/68.  The heart rate was 100 and regular.  She is 5 foot 9 inches, and weighs 242 pounds.  SpO2 is 97% on room air.  General:  She is a pleasant, middle-aged female.  She is in no acute distress.  She is afebrile (temperature 97.9%).  Her vascular exam revealed a regular rhythm.  No significant murmurs.  Her lungs were clear.  Her chest wall has a well-healed sternotomy.  Neck was clear without  audible bruit.  Lower extremities had no edema and no pain on palpation.  The right Endovein incision medial to the knee did have a 3-4 cm fluid collection underneath.  Surgery came and evaluated and feels this will be fade with time.  It is scar tissue, blood collection or seroma.  They do not believe it is infected or an abscess.  Eyes:  She has bilateral xanthelasmas.  Psych:  Affect is appropriate.    ASSESSMENT/PLAN:  1.  Coronary artery disease status post CABG.  The patient was recently diagnosed with coronary disease after cardiac angiography to evaluate her left internal carotid artery, which was completely occluded. She will continue aspirin , beta-blocker and statin therapy.  2.  Moodiness.  The patient and her husband report that she has been rather moody, angers easily.  We discussed post-CABG depression with her at length.  We recommended her see a primary care for antilytic or antidepressant medications and they are agreeable.  3.  Hyperlipidemia.  The patient has significant xanthelasma.  She is on Zocor  20.  Will stop and then start Lipitor 80 and get a lipid panel in 2 months.  4.  Diabetes mellitus.  Recommend hemoglobin A1c less than 7.  5.  Lower extremity  swelling.  This is improved, but it was an issue status post after CABG.  Have given an order for an echocardiogram that showed normal LV function prior to CABG.  6.  Peripheral vascular disease.  The patient has a totally occluded left internal carotid artery.  Her right internal carotid had proximal 20-30% stenosis.  7.  She will return to clinic in 3 months for followup or sooner if needed.   8.  We will plan a carotid duplex in 6 months.      Linzy Laury Katona, PA-C  [facility], Section of Cardiology    Seferino Medin, MD  Assistant Professor, Section of Cardiology  [facility] Medicine    [id number]; D: 05/21/2011 15:35:04; T: 05/22/2011 14:53:19    I have seen and evaluated the patient jointly with Cristyn Crossno Katona  PA-C and agree with her assessment, recommendations and plans.    Seferino Medin, MD

## 2011-06-05 ENCOUNTER — Other Ambulatory Visit (HOSPITAL_COMMUNITY): Payer: Self-pay | Admitting: Family Medicine

## 2011-06-05 DIAGNOSIS — R1011 Right upper quadrant pain: Secondary | ICD-10-CM

## 2011-06-06 ENCOUNTER — Other Ambulatory Visit (HOSPITAL_COMMUNITY): Payer: Managed Care, Other (non HMO)

## 2011-06-09 ENCOUNTER — Ambulatory Visit (HOSPITAL_COMMUNITY): Admission: RE | Admit: 2011-06-09 | Payer: Managed Care, Other (non HMO) | Source: Ambulatory Visit

## 2011-06-12 ENCOUNTER — Encounter (HOSPITAL_BASED_OUTPATIENT_CLINIC_OR_DEPARTMENT_OTHER): Payer: Self-pay | Admitting: Thoracic Surgery (Cardiothoracic Vascular Surgery)

## 2011-06-12 ENCOUNTER — Ambulatory Visit (HOSPITAL_BASED_OUTPATIENT_CLINIC_OR_DEPARTMENT_OTHER): Payer: Medicare Other | Admitting: Thoracic Surgery (Cardiothoracic Vascular Surgery)

## 2011-06-12 ENCOUNTER — Ambulatory Visit
Admission: RE | Admit: 2011-06-12 | Discharge: 2011-06-12 | Disposition: A | Discharge status: Home / Self Care | Payer: Medicare Other | Source: Ambulatory Visit | Attending: Thoracic Surgery (Cardiothoracic Vascular Surgery) | Admitting: Thoracic Surgery (Cardiothoracic Vascular Surgery)

## 2011-06-12 VITALS — BP 88/60 | HR 101 | Ht 69.0 in | Wt 246.9 lb

## 2011-06-12 DIAGNOSIS — Z8673 Personal history of transient ischemic attack (TIA), and cerebral infarction without residual deficits: Secondary | ICD-10-CM | POA: Insufficient documentation

## 2011-06-12 DIAGNOSIS — E669 Obesity, unspecified: Secondary | ICD-10-CM | POA: Insufficient documentation

## 2011-06-12 MED ORDER — METOLAZONE 5 MG TABLET
5.0000 mg | ORAL_TABLET | Freq: Every day | ORAL | Status: AC
Start: 2011-06-13 — End: 2011-06-16

## 2011-06-12 MED ORDER — POTASSIUM CHLORIDE ER 20 MEQ TABLET,EXTENDED RELEASE(PART/CRYST)
20.0000 meq | ORAL_TABLET | Freq: Every day | ORAL | Status: DC
Start: 2011-06-13 — End: 2016-11-19

## 2011-06-12 MED ORDER — FUROSEMIDE 20 MG TABLET
20.00 mg | ORAL_TABLET | Freq: Two times a day (BID) | ORAL | Status: DC
Start: 2011-06-16 — End: 2017-03-18

## 2011-06-12 NOTE — Progress Notes (Signed)
 Subjective:     Pt underwent CABG 03/2011, called earlier this week with concerns of her surgical wound from her RLE harvest site.  Pt states the swelling has been there since surgery.  Not painful or warm to touch.  Pt also states that she is having dyspnea that worsened when she changed her metoprolol  to coreg.  Pt states that she is only able to walk short distances before getting short of breath.      Patient Active Problem List   Diagnoses Date Noted   . Dyspnea 06/12/2011   . 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 Tattoo    . 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 Carotid Artery Disease 03/18/2005   . Carotid Artery Disease 03/12/2004   . Diabetes Mellitus, Type II 03/24/2003   . Hypercholesterolemia 04/15/2002   . Hypertension 04/15/2002   . CVA 03/30/2001   . Stroke 03/30/2001     Past Medical History   Diagnosis Date   . Diabetes mellitus type II    . Hyperlipidemia    . HTN    . Tattoo      Left breast, Right shoulder   . Ear piercing    . Coronary artery disease    . Hypertension    . Stroke      2002   . Neck problem      herniated cervical disc   . GERD (gastroesophageal reflux disease)    . Obesity    . Type 2 diabetes mellitus      Dx 1999 average fasting 70-89      Past Surgical History   Procedure Date   . Hx cholecystectomy    . Hx tonsillectomy    . Hx cataract removal      r and l eyes with implants   . Hx cesarean section      x 3   . Hx ankle fracture tx      Right       Cannot display prior to admission medications because the patient has not been admitted in this contact.        Allergies   Allergen Reactions   . No Known Drug Allergies       History   Substance Use Topics   . Smoking status: Former Smoker -- 0.2 packs/day for 20 years     Types: Cigarettes     Quit date: 03/30/2011   . Smokeless tobacco: Never Used   . Alcohol Use: No      Family History   Problem Relation Age of Onset   . Diabetes Mother    . Heart Attack Mother    . Hypertension Mother       Review of Systems  Nothing except as in  HPI    Objective:     BP 88/60  Pulse 101  Ht 1.753 m (5' 9)  Wt 112 kg (246 lb 14.6 oz)  BMI 36.46 kg/m2  SpO2 95%  Physical Exam:  General appearance: Appears healthy.  Alert; in no acute distress.  Pleasant.  Cardiac: PMI normal. No lifts, heaves, or thrills. RRR. No murmurs, clicks, gallops, or rubs    Sternum stable   Respiratory: Respiratory effort normal  Abdomen: soft, non-tender. Bowel sounds normal. No masses,  no organomegaly  Extremities: RLE, hematoma at surgical sight, no warmth to touch, small pinpoint drainage of old blood, no signs of active infection, BL pedal edema  Surgical Incisions:   Chest: well healing, without drainage  Extremities:see above,       Assessment:     SP CABG  Dyspnea  RLE hematoma of incision    Plan:     CXR reveals B/L pleural effusions, will initiate lasix  BID with follow up next week    RLE, hematoma at surgical site, no signs of active infection, advised pt to place warm compress over hematoma, not sure if opening incision at this point with no signs of active infections and is not bothersome to pt would benefit pt at this time.      Reyes MATSU Madason Rauls, PA-C 06/12/2011, 2:40 PM

## 2011-06-13 ENCOUNTER — Ambulatory Visit (HOSPITAL_COMMUNITY)
Admission: RE | Admit: 2011-06-13 | Discharge: 2011-06-13 | Disposition: A | Discharge status: Home / Self Care | Payer: Managed Care, Other (non HMO) | Source: Ambulatory Visit | Attending: Family Medicine | Admitting: Family Medicine

## 2011-06-13 ENCOUNTER — Encounter (HOSPITAL_COMMUNITY): Payer: Self-pay

## 2011-06-13 ENCOUNTER — Ambulatory Visit (HOSPITAL_COMMUNITY): Admission: RE | Admit: 2011-06-13 | Payer: Managed Care, Other (non HMO) | Source: Ambulatory Visit

## 2011-06-13 ENCOUNTER — Other Ambulatory Visit (HOSPITAL_COMMUNITY): Payer: Self-pay | Admitting: Family Medicine

## 2011-06-13 DIAGNOSIS — Z9889 Other specified postprocedural states: Secondary | ICD-10-CM | POA: Insufficient documentation

## 2011-06-13 DIAGNOSIS — R160 Hepatomegaly, not elsewhere classified: Secondary | ICD-10-CM | POA: Insufficient documentation

## 2011-06-13 DIAGNOSIS — R1011 Right upper quadrant pain: Secondary | ICD-10-CM

## 2011-06-13 MED ORDER — IOHEXOL 300 MG/ML  SOLN
100.0000 mL | Freq: Once | INTRAMUSCULAR | Status: AC | PRN
  Administered 2011-06-13: 100 mL via INTRAVENOUS

## 2011-06-16 NOTE — Progress Notes (Signed)
I did not see the patient as I was called to hospital for emergency.    Sheria Lang. Angi Goodell MD

## 2011-06-18 ENCOUNTER — Ambulatory Visit
Admission: RE | Admit: 2011-06-18 | Discharge: 2011-06-18 | Disposition: A | Discharge status: Home / Self Care | Payer: Medicare Other | Source: Ambulatory Visit | Attending: Physician Assistant | Admitting: Physician Assistant

## 2011-06-18 ENCOUNTER — Encounter (HOSPITAL_BASED_OUTPATIENT_CLINIC_OR_DEPARTMENT_OTHER): Payer: Self-pay | Admitting: CARDIAC SURGERY-RUBY

## 2011-06-18 ENCOUNTER — Ambulatory Visit (HOSPITAL_BASED_OUTPATIENT_CLINIC_OR_DEPARTMENT_OTHER): Payer: Medicare Other | Admitting: CARDIAC SURGERY-RUBY

## 2011-06-18 VITALS — BP 110/62 | HR 108 | Ht 69.0 in | Wt 242.9 lb

## 2011-06-18 DIAGNOSIS — R609 Edema, unspecified: Secondary | ICD-10-CM | POA: Insufficient documentation

## 2011-06-18 DIAGNOSIS — Z951 Presence of aortocoronary bypass graft: Secondary | ICD-10-CM | POA: Insufficient documentation

## 2011-06-18 DIAGNOSIS — J90 Pleural effusion, not elsewhere classified: Secondary | ICD-10-CM | POA: Insufficient documentation

## 2011-06-18 DIAGNOSIS — R0609 Other forms of dyspnea: Secondary | ICD-10-CM | POA: Insufficient documentation

## 2011-06-18 DIAGNOSIS — I2510 Atherosclerotic heart disease of native coronary artery without angina pectoris: Secondary | ICD-10-CM | POA: Insufficient documentation

## 2011-06-18 MED ORDER — METOLAZONE 5 MG TABLET
5.00 mg | ORAL_TABLET | ORAL | Status: AC
Start: 2011-06-18 — End: 2011-06-23

## 2011-06-18 NOTE — Progress Notes (Signed)
S:  Monique [name] presents today for follow up.  She is S/P CABG x2 from 03/2011.  She was evaluated by Arva Chafe PA-C last week at which time she was complaining of LE swelling, worsening shortness of breath, and RLE pain a long her harvest incision.  At that time CXR did reveal small bilateral pleural effusions, which did not require intervention at that time.  She was placed on Lasix BID.  In regards to her RLE incision, it was felt to be hematoma and some old blood was expressed.  She presents today for further follow up.  The patient complains of being short of breath all the time.  She states that she is not able to do anything.  She is short of breath even walking to her bathroom.  She continues to complain of LE swelling.  The patient was encouraged to walk as much as tolerated.  The patient was also instructed to do deep breathing exercises several times per minute.    O: BP 110/62   Pulse 108   Ht 1.753 m (5\' 9" )   Wt 110.2 kg (242 lb 15.2 oz)   BMI 35.88 kg/m2   SpO2 95%    Gen: no apparent distress  Lungs: CTA bilaterally, diminished bilateral bases  Heart: RRR, sternum stable  Abd: soft non tender, non distended  Ext: minimal LE edema    CXR: worsening small pleural effusion on right and left    A/P:    1. S/P CABG  2. Pleural effusions bilaterally- will continue Lasix, Add Zaroxoln 5mg  every other day for 5 days, deep breathing exercises  3. LE Edema- ambulation  4. RTC in 10 days with repeat CXR    Monique Valbuena Renae Anniyah Mood, PA-C 06/18/2011, 10:45 AM  Patient stills complains of SOB, which got worst when she was switched for lopressor to Coreg. She is now back to [place]. Patient was seen last week and found to have small right pleura effusion. Repeat CXR shows minimal change. There is mild to trace edema in the feet. Small seroma in the right knee incision.  Will give 5 doses of Zaroxyln and see patient back in 2 weeks with repeat CXR.   I personally saw and examined the patient. See Midlevel's note for additional details.    Ellery Plunk, MD 06/18/2011, 10:59 AM

## 2011-06-27 ENCOUNTER — Other Ambulatory Visit (HOSPITAL_BASED_OUTPATIENT_CLINIC_OR_DEPARTMENT_OTHER): Payer: Self-pay

## 2011-07-02 ENCOUNTER — Ambulatory Visit (HOSPITAL_BASED_OUTPATIENT_CLINIC_OR_DEPARTMENT_OTHER): Payer: Medicare Other | Admitting: CARDIAC SURGERY-RUBY

## 2011-07-02 ENCOUNTER — Ambulatory Visit
Admission: RE | Admit: 2011-07-02 | Discharge: 2011-07-02 | Disposition: A | Discharge status: Home / Self Care | Payer: Medicare Other | Source: Ambulatory Visit | Attending: CARDIAC SURGERY-RUBY | Admitting: CARDIAC SURGERY-RUBY

## 2011-07-02 ENCOUNTER — Encounter (HOSPITAL_BASED_OUTPATIENT_CLINIC_OR_DEPARTMENT_OTHER): Payer: Self-pay | Admitting: CARDIAC SURGERY-RUBY

## 2011-07-02 DIAGNOSIS — I252 Old myocardial infarction: Secondary | ICD-10-CM | POA: Insufficient documentation

## 2011-07-02 DIAGNOSIS — Z951 Presence of aortocoronary bypass graft: Secondary | ICD-10-CM | POA: Insufficient documentation

## 2011-07-02 DIAGNOSIS — R091 Pleurisy: Secondary | ICD-10-CM | POA: Insufficient documentation

## 2011-07-02 DIAGNOSIS — E119 Type 2 diabetes mellitus without complications: Secondary | ICD-10-CM | POA: Insufficient documentation

## 2011-07-02 DIAGNOSIS — Z9861 Coronary angioplasty status: Secondary | ICD-10-CM | POA: Insufficient documentation

## 2011-07-02 DIAGNOSIS — I2510 Atherosclerotic heart disease of native coronary artery without angina pectoris: Secondary | ICD-10-CM | POA: Insufficient documentation

## 2011-07-02 NOTE — Progress Notes (Signed)
S:  Monique [name] presents today for follow up.  She states that she was recently admitted to the hospital in [place].  She states she presented there one evening after she was unable to catch her breath.  She states they sent her for a CT scan to check for a blood clot.  This did not reveal a blood clot, but did reveal pleurisy.  She also states they did blood work on her that revealed she was having a heart attack.  She was taken to the cath lab at which time she had 4 stents placed.  When I asked the patient if her previous bypass grafts had gone down, she stated she was told one of the grafts had twisted.  She was also given a Lifevest for an EF of about 20%.  The patient also states she is currently taking steroids for Pleurisy and she notes some relief.  She is also currently taking insulin while on steroids due to elevated blood sugars.  Overall the patient states she feels better.  She still has some pain on inspiration, but it has significantly improved.  She did question if her heart function would improve over time with the placement of the 4 stents.  I explained to the patient that it depends on the state of her heart muscle.  I explained that ideally it should, but if her heart muscle would be dead then her heart function would not improve.  This will be evaluated by the Cardiologist in [place].    O: BP 92/68   Pulse 95   Temp 36.1 C (97 F)   Resp 16   Ht 1.753 m (5\' 9" )   Wt 113.399 kg (250 lb)   BMI 36.92 kg/m2   SpO2 99%    Gen: no apparent distress, the patient looks like she feels better than previous visits  Lungs: CTA bilaterally  Heart: RRR, sternum stable  Abd: soft non-tender, non-distended  Skin: incisions healing well  Neuro: grossly normal    A/P:    1. S/P CABG doing better  2. S/P MI, cardiac catheterization with PCI x4  3. Pleurisy- will cont steroid taper  4. DM- continue current insulin regimen until off steroids and blood sugars are under good control  5. RTC prn.     [name], PA-C 07/02/2011, 10:31 AM    Patient improved following PCI. Will follow up with her Cardiologist in [place].  I will see PRN.  I personally saw and examined the patient. I agree with the plan of care as documented. See the mid-level provider's note for full details.   Ellery Plunk, MD

## 2011-07-09 HISTORY — PX: BILE DUCT STENT PLACEMENT: SHX1227

## 2011-08-27 ENCOUNTER — Encounter (HOSPITAL_BASED_OUTPATIENT_CLINIC_OR_DEPARTMENT_OTHER): Payer: Self-pay

## 2011-08-27 ENCOUNTER — Other Ambulatory Visit (HOSPITAL_BASED_OUTPATIENT_CLINIC_OR_DEPARTMENT_OTHER): Payer: Self-pay

## 2011-10-03 ENCOUNTER — Encounter (INDEPENDENT_AMBULATORY_CARE_PROVIDER_SITE_OTHER): Payer: Self-pay | Admitting: *Deleted

## 2011-10-20 ENCOUNTER — Ambulatory Visit (INDEPENDENT_AMBULATORY_CARE_PROVIDER_SITE_OTHER): Payer: Managed Care, Other (non HMO) | Admitting: Internal Medicine

## 2011-12-09 HISTORY — PX: HX CATARACT REMOVAL: SHX102

## 2012-05-18 ENCOUNTER — Other Ambulatory Visit (HOSPITAL_BASED_OUTPATIENT_CLINIC_OR_DEPARTMENT_OTHER): Payer: Self-pay

## 2012-05-18 MED ORDER — ATORVASTATIN 80 MG TABLET
80.0000 mg | ORAL_TABLET | Freq: Every day | ORAL | Status: DC
Start: 2012-05-18 — End: 2012-07-06

## 2012-05-18 NOTE — Telephone Encounter (Signed)
Received a fax from [place] [name] in [name], requesting a refill on Atorvastatin 80 mg 1qd.  Called pt and scheduled appt with Dr Caralyn Guile on 07/14/12, advised her that we would send enough med to hold her until appt.

## 2012-06-15 ENCOUNTER — Emergency Department (HOSPITAL_BASED_OUTPATIENT_CLINIC_OR_DEPARTMENT_OTHER)
Admission: EM | Admit: 2012-06-15 | Discharge: 2012-06-15 | Disposition: A | Discharge status: Home / Self Care | Payer: Managed Care, Other (non HMO) | Attending: Emergency Medicine | Admitting: Emergency Medicine

## 2012-06-15 ENCOUNTER — Encounter (HOSPITAL_BASED_OUTPATIENT_CLINIC_OR_DEPARTMENT_OTHER): Payer: Self-pay | Admitting: Emergency Medicine

## 2012-06-15 ENCOUNTER — Emergency Department (HOSPITAL_BASED_OUTPATIENT_CLINIC_OR_DEPARTMENT_OTHER): Payer: Managed Care, Other (non HMO)

## 2012-06-15 DIAGNOSIS — R509 Fever, unspecified: Secondary | ICD-10-CM | POA: Insufficient documentation

## 2012-06-15 DIAGNOSIS — R109 Unspecified abdominal pain: Secondary | ICD-10-CM | POA: Insufficient documentation

## 2012-06-15 DIAGNOSIS — R079 Chest pain, unspecified: Secondary | ICD-10-CM | POA: Insufficient documentation

## 2012-06-15 DIAGNOSIS — K861 Other chronic pancreatitis: Secondary | ICD-10-CM | POA: Insufficient documentation

## 2012-06-15 LAB — CBC WITH DIFFERENTIAL/PLATELET
Eosinophils Relative: 1 % (ref 0–5)
HCT: 36 % (ref 36.0–46.0)
Hemoglobin: 12.6 g/dL (ref 12.0–15.0)
Lymphocytes Relative: 13 % (ref 12–46)
Lymphs Abs: 1.1 10*3/uL (ref 0.7–4.0)
MCV: 84.9 fL (ref 78.0–100.0)
Monocytes Absolute: 0.9 10*3/uL (ref 0.1–1.0)
RBC: 4.24 MIL/uL (ref 3.87–5.11)
WBC: 8.4 10*3/uL (ref 4.0–10.5)

## 2012-06-15 LAB — URINALYSIS, ROUTINE W REFLEX MICROSCOPIC
Hgb urine dipstick: NEGATIVE
Ketones, ur: 40 mg/dL — AB
Protein, ur: NEGATIVE mg/dL
Urobilinogen, UA: 0.2 mg/dL (ref 0.0–1.0)

## 2012-06-15 LAB — COMPREHENSIVE METABOLIC PANEL
ALT: 16 U/L (ref 0–35)
CO2: 27 mEq/L (ref 19–32)
Calcium: 9.4 mg/dL (ref 8.4–10.5)
Chloride: 100 mEq/L (ref 96–112)
Creatinine, Ser: 0.7 mg/dL (ref 0.50–1.10)
GFR calc Af Amer: >90 mL/min (ref >90)
GFR calc non Af Amer: >90 mL/min (ref >90)
Glucose, Bld: 96 mg/dL (ref 70–99)
Total Bilirubin: 0.5 mg/dL (ref 0.3–1.2)

## 2012-06-15 MED ORDER — ONDANSETRON HCL 4 MG/2ML IJ SOLN
4.0000 mg | Freq: Once | INTRAMUSCULAR | Status: AC
  Administered 2012-06-15: 4 mg via INTRAVENOUS
  Filled 2012-06-15: qty 2

## 2012-06-15 MED ORDER — SODIUM CHLORIDE 0.9 % IV BOLUS (SEPSIS)
500.0000 mL | Freq: Once | INTRAVENOUS | Status: AC
  Administered 2012-06-15: 1000 mL via INTRAVENOUS

## 2012-06-15 MED ORDER — HYDROMORPHONE HCL PF 1 MG/ML IJ SOLN
1.0000 mg | Freq: Once | INTRAMUSCULAR | Status: AC
  Administered 2012-06-15: 1 mg via INTRAVENOUS
  Filled 2012-06-15: qty 1

## 2012-06-15 NOTE — ED Provider Notes (Signed)
History     CSN: [id number]  Arrival date & time 06/15/12  1815   First MD Initiated Contact with Patient 06/15/12 1824      Chief Complaint  Patient presents with  . Fever  . Abdominal Pain    (Consider location/radiation/quality/duration/timing/severity/associated sxs/prior treatment) HPI Pt with history of chronic pancreatitis, currently treated in a pain clinic reports no change in her baseline abdominal pain, but states she has had intermittent sharp severe pain in R lower ribs, comes and goes, lasts for a few minutes at a time, no particular provoking or relieving factors. No cough but states she has had fevers at home to 101.11F and "hot flashes". She has felt some nausea, but no vomiting. She has had diarrhea and urinary frequency, but no dysuria or hematuria. No vaginal complaints.   History reviewed. No pertinent past medical history.  Past Surgical History  Procedure Date  . Bile duct stent placement     No family history on file.  History  Substance Use Topics  . Smoking status: Not on file  . Smokeless tobacco: Not on file  . Alcohol Use:     OB History    Grav Para Term Preterm Abortions TAB SAB Ect Mult Living                  Review of Systems All other systems reviewed and are negative except as noted in HPI.    Allergies  Amoxicillin-pot clavulanate; Butrans; Doxycycline; Imitrex; and Lyrica  Home Medications   Current Outpatient Rx  Name Route Sig Dispense Refill  . ALPRAZOLAM 0.5 MG PO TABS Oral Take 0.5 mg by mouth at bedtime as needed. Patient uses this medication for anxiety.    . IBUPROFEN 800 MG PO TABS Oral Take 800 mg by mouth every 8 (eight) hours as needed. Patient used this medication for her fever.    Marland Kitchen OXYMORPHONE HCL ER 20 MG PO TB12 Oral Take 20 mg by mouth every 12 (twelve) hours. Patient uses this medication for pain.      BP 131/80  Pulse 92  Temp 99 F (37.2 C) (Oral)  Resp 16  Ht 5\' 6"  (1.676 m)  Wt 123 lb (55.792 kg)   BMI 19.85 kg/m2  SpO2 99%  LMP 05/16/2012  Physical Exam  Nursing note and vitals reviewed. Constitutional: She is oriented to person, place, and time. She appears well-developed and well-nourished.  HENT:  Head: Normocephalic and atraumatic.  Eyes: EOM are normal. Pupils are equal, round, and reactive to light.  Neck: Normal range of motion. Neck supple.  Cardiovascular: Normal rate, normal heart sounds and intact distal pulses.   Pulmonary/Chest: Effort normal and breath sounds normal. No respiratory distress. She has no wheezes. She has no rales. She exhibits no tenderness.  Abdominal: Soft. Bowel sounds are normal. She exhibits no distension and no mass. There is tenderness. There is no rebound and no guarding.  Musculoskeletal: Normal range of motion. She exhibits no edema and no tenderness.  Neurological: She is alert and oriented to person, place, and time. She has normal strength. No cranial nerve deficit or sensory deficit.  Skin: Skin is warm and dry. No rash noted.  Psychiatric: She has a normal mood and affect.    ED Course  Procedures (including critical care time)  Labs Reviewed  COMPREHENSIVE METABOLIC PANEL - Abnormal; Notable for the following:    Potassium 3.3 (*)     All other components within normal limits  URINALYSIS, ROUTINE  W REFLEX MICROSCOPIC - Abnormal; Notable for the following:    Color, Urine AMBER (*)  BIOCHEMICALS MAY BE AFFECTED BY COLOR   APPearance CLOUDY (*)     Bilirubin Urine SMALL (*)     Ketones, ur 40 (*)     All other components within normal limits  CBC WITH DIFFERENTIAL  LIPASE, BLOOD   Dg Chest 2 View  06/15/2012  *RADIOLOGY REPORT*  Clinical Data: Fever  CHEST - 2 VIEW  Comparison: 03/27/2011  Findings: Normal heart size.  Hyperaeration.  No consolidation, mass, pleural effusion, pneumothorax.  IMPRESSION: No active cardiopulmonary disease.  Changes related to COPD.  Original Report Authenticated By: Donavan Burnet, M.D.     No  diagnosis found.    MDM  Labs and imaging unremarkable as above. Pt with fever at home but none here. No clear cause of fever or sharp right sided chest pain. No indication that there is a serious etiology and she is safe for discharge. Advised to followup with PCP.         Charles B. Bernette Mayers, MD 06/15/12 2010

## 2012-06-15 NOTE — ED Notes (Signed)
States has been having "hot flashes" with fever of 101.3 starting on Saturday night.  Reports abdominal pain.  Is currently being treated for pancreatitis but states this pain is worse.  Has generalized malaise.

## 2012-07-06 ENCOUNTER — Other Ambulatory Visit (HOSPITAL_BASED_OUTPATIENT_CLINIC_OR_DEPARTMENT_OTHER): Payer: Self-pay

## 2012-07-06 NOTE — Telephone Encounter (Signed)
Received a fax from [facility] in [place], requesting a refill on Atorvastatin 80 mg 1qd.  Pt has appt 07/21/12.

## 2012-07-07 MED ORDER — ATORVASTATIN 80 MG TABLET
80.0000 mg | ORAL_TABLET | Freq: Every evening | ORAL | Status: DC
Start: 2012-07-06 — End: 2016-11-19

## 2012-07-14 ENCOUNTER — Encounter (HOSPITAL_BASED_OUTPATIENT_CLINIC_OR_DEPARTMENT_OTHER): Payer: Self-pay

## 2012-07-21 ENCOUNTER — Encounter (HOSPITAL_BASED_OUTPATIENT_CLINIC_OR_DEPARTMENT_OTHER): Payer: Self-pay

## 2012-08-06 ENCOUNTER — Encounter (HOSPITAL_BASED_OUTPATIENT_CLINIC_OR_DEPARTMENT_OTHER): Payer: Self-pay

## 2012-08-06 NOTE — Progress Notes (Signed)
Received request from pt's pharmacy, [facility], for a refill on her atorvastatin 80 mg. Pt had this medication refilled one month ago to have enough until her appt with Dr. Caralyn Guile. Pt cancelled appt with no rescheduled date. Did not approve refill. Pt needs to make new appt.

## 2013-09-20 HISTORY — PX: HX YAG: 210000615

## 2014-04-13 ENCOUNTER — Other Ambulatory Visit: Payer: Self-pay | Admitting: Pain Medicine

## 2014-04-13 DIAGNOSIS — M545 Low back pain, unspecified: Secondary | ICD-10-CM

## 2014-04-13 DIAGNOSIS — IMO0002 Reserved for concepts with insufficient information to code with codable children: Secondary | ICD-10-CM

## 2014-04-13 DIAGNOSIS — M479 Spondylosis, unspecified: Secondary | ICD-10-CM

## 2014-04-20 ENCOUNTER — Other Ambulatory Visit: Payer: Managed Care, Other (non HMO)

## 2014-11-04 ENCOUNTER — Emergency Department (HOSPITAL_COMMUNITY): Payer: Worker's Compensation

## 2014-11-04 ENCOUNTER — Encounter (HOSPITAL_COMMUNITY): Payer: Self-pay

## 2014-11-04 ENCOUNTER — Emergency Department (HOSPITAL_COMMUNITY)
Admission: EM | Admit: 2014-11-04 | Discharge: 2014-11-04 | Disposition: A | Discharge status: Home / Self Care | Payer: Worker's Compensation | Attending: Emergency Medicine | Admitting: Emergency Medicine

## 2014-11-04 DIAGNOSIS — Z87891 Personal history of nicotine dependence: Secondary | ICD-10-CM | POA: Diagnosis not present

## 2014-11-04 DIAGNOSIS — Y9289 Other specified places as the place of occurrence of the external cause: Secondary | ICD-10-CM | POA: Diagnosis not present

## 2014-11-04 DIAGNOSIS — S3992XA Unspecified injury of lower back, initial encounter: Secondary | ICD-10-CM | POA: Diagnosis not present

## 2014-11-04 DIAGNOSIS — Y990 Civilian activity done for income or pay: Secondary | ICD-10-CM | POA: Diagnosis not present

## 2014-11-04 DIAGNOSIS — Z79899 Other long term (current) drug therapy: Secondary | ICD-10-CM | POA: Insufficient documentation

## 2014-11-04 DIAGNOSIS — M549 Dorsalgia, unspecified: Secondary | ICD-10-CM

## 2014-11-04 DIAGNOSIS — X58XXXA Exposure to other specified factors, initial encounter: Secondary | ICD-10-CM | POA: Insufficient documentation

## 2014-11-04 DIAGNOSIS — Y9389 Activity, other specified: Secondary | ICD-10-CM | POA: Insufficient documentation

## 2014-11-04 MED ORDER — HYDROMORPHONE HCL 1 MG/ML IJ SOLN
1.0000 mg | Freq: Once | INTRAMUSCULAR | Status: AC
  Administered 2014-11-04: 1 mg via INTRAVENOUS
  Filled 2014-11-04: qty 1

## 2014-11-04 MED ORDER — DIAZEPAM 5 MG/ML IJ SOLN
5.0000 mg | Freq: Once | INTRAMUSCULAR | Status: AC
  Administered 2014-11-04: 5 mg via INTRAVENOUS
  Filled 2014-11-04: qty 2

## 2014-11-04 MED ORDER — DIAZEPAM 5 MG PO TABS: 5.0000 mg | ORAL_TABLET | Freq: Three times a day (TID) | ORAL | Status: DC | PRN

## 2014-11-04 NOTE — ED Provider Notes (Signed)
CSN: [id number]     Arrival date & time 11/04/14  1515 History   First MD Initiated Contact with Patient 11/04/14 1533     No chief complaint on file.    (Consider location/radiation/quality/duration/timing/severity/associated sxs/prior Treatment) HPI Comments: Patient presents to the ED with a chief complaint of low back pain.  Patient states that she was lifting a box of frozen bread, when she felt a pop in her back, which was followed by intense pain.  Patient is followed by pain management for chronic back pain.  Patient states that the pain is 10/10.  She states that she was able to stand after the pain started.  She states that anything she does causes the pain to be worse.  She denies any bowel or bladder incontinence.  She denies any fevers or chills.  She had a recent MRI which reportedly showed a herniated disc.  The history is provided by the patient. No language interpreter was used.    History reviewed. No pertinent past medical history. Past Surgical History  Procedure Laterality Date  . Bile duct stent placement    . Cholecystectomy     No family history on file. History  Substance Use Topics  . Smoking status: Former Games developermoker  . Smokeless tobacco: Not on file  . Alcohol Use: No   OB History    No data available     Review of Systems  Constitutional: Negative for fever and chills.  Gastrointestinal:       No bowel incontinence  Genitourinary:       No urinary incontinence  Musculoskeletal: Positive for myalgias, back pain and arthralgias.  Neurological:       No saddle anesthesia  All other systems reviewed and are negative.     Allergies  Imitrex; Doxycycline; Lyrica; Amoxicillin-pot clavulanate; and Butrans  Home Medications   Prior to Admission medications   Medication Sig Start Date End Date Taking? Authorizing Provider  Armodafinil (NUVIGIL) 250 MG tablet Take 250 mg by mouth daily.   Yes Historical Provider, MD  butalbital-acetaminophen-caffeine  (FIORICET, ESGIC) 50-325-40 MG per tablet Take 1-2 tablets by mouth every 6 (six) hours as needed for headache or migraine.   Yes Historical Provider, MD  clonazePAM (KLONOPIN) 1 MG tablet Take 1 mg by mouth at bedtime.   Yes Historical Provider, MD  HYDROmorphone HCl (EXALGO) 32 MG T24A Take 32 mg by mouth daily.   Yes Historical Provider, MD  Oxycodone HCl 20 MG TABS Take 1 tablet by mouth 4 (four) times daily.   Yes Historical Provider, MD  PRESCRIPTION MEDICATION Injection at pain clinic   Yes Historical Provider, MD   BP 119/60 mmHg  Pulse 87  Temp(Src) 98.3 F (36.8 C) (Oral)  Resp 16  SpO2 100%  LMP 05/16/2012 Physical Exam  Constitutional: She is oriented to person, place, and time. She appears well-developed and well-nourished. No distress.  HENT:  Head: Normocephalic and atraumatic.  Eyes: Conjunctivae and EOM are normal. Right eye exhibits no discharge. Left eye exhibits no discharge. No scleral icterus.  Neck: Normal range of motion. Neck supple. No tracheal deviation present.  Cardiovascular: Normal rate, regular rhythm and normal heart sounds.  Exam reveals no gallop and no friction rub.   No murmur heard. Pulmonary/Chest: Effort normal and breath sounds normal. No respiratory distress. She has no wheezes.  Abdominal: Soft. She exhibits no distension. There is no tenderness.  Musculoskeletal: Normal range of motion.  Right lumbar paraspinal muscles tender to palpation, no bony  tenderness, step-offs, or gross abnormality or deformity of spine, patient is able to ambulate, moves all extremities  Bilateral great toe extension intact Bilateral plantar/dorsiflexion intact  Neurological: She is alert and oriented to person, place, and time. She has normal reflexes.  Sensation and strength intact bilaterally Symmetrical reflexes  Skin: Skin is warm. She is not diaphoretic.  Psychiatric: She has a normal mood and affect. Her behavior is normal. Judgment and thought content  normal.  Nursing note and vitals reviewed.   ED Course  Procedures (including critical care time) Labs Review Labs Reviewed - No data to display  Imaging Review Dg Lumbar Spine Complete  11/04/2014   CLINICAL DATA:  Acute right-sided lower back pain without radiculopathy. Occurred after she tried to pick up heavy object at work.  EXAM: LUMBAR SPINE - COMPLETE 4+ VIEW  COMPARISON:  None.  FINDINGS: There is no evidence of lumbar spine fracture. Alignment is normal. Intervertebral disc spaces are maintained. Posterior facet joints appear normal.  IMPRESSION: Normal lumbar spine.   Electronically Signed   By: Roque LiasJames  Green M.D.   On: 11/04/2014 16:30     EKG Interpretation None      MDM   Final diagnoses:  Back pain    Patient with back pain.  No neurological deficits and normal neuro exam.  Patient is ambulatory.  No loss of bowel or bladder control.  Doubt cauda equina.  Denies fever,  doubt epidural abscess or other lesion. Recommend back exercises, stretching, RICE, and will treat with a short course of valium.  Patient has oxycodone at home.  Encouraged the patient that there could be a need for additional workup and/or imaging such as MRI, if the symptoms do not resolve. Patient advised that if the back pain does not resolve, or radiates, this could progress to more serious conditions and is encouraged to follow-up with PCP or orthopedics within 2 weeks.       Roxy Horsemanobert Laquan Ludden, PA-C 11/04/14 1706  Layla MawKristen N Ward, DO 11/04/14 2345

## 2014-11-04 NOTE — Discharge Instructions (Signed)
Back Pain, Adult Low back pain is very common. About 1 in 5 people have back pain.The cause of low back pain is rarely dangerous. The pain often gets better over time.About half of people with a sudden onset of back pain feel better in just 2 weeks. About 8 in 10 people feel better by 6 weeks.  CAUSES Some common causes of back pain include:  Strain of the muscles or ligaments supporting the spine.  Wear and tear (degeneration) of the spinal discs.  Arthritis.  Direct injury to the back. DIAGNOSIS Most of the time, the direct cause of low back pain is not known.However, back pain can be treated effectively even when the exact cause of the pain is unknown.Answering your caregiver's questions about your overall health and symptoms is one of the most accurate ways to make sure the cause of your pain is not dangerous. If your caregiver needs more information, he or she may order lab work or imaging tests (X-rays or MRIs).However, even if imaging tests show changes in your back, this usually does not require surgery. HOME CARE INSTRUCTIONS For many people, back pain returns.Since low back pain is rarely dangerous, it is often a condition that people can learn to manageon their own.   Remain active. It is stressful on the back to sit or stand in one place. Do not sit, drive, or stand in one place for more than 30 minutes at a time. Take short walks on level surfaces as soon as pain allows.Try to increase the length of time you walk each day.  Do not stay in bed.Resting more than 1 or 2 days can delay your recovery.  Do not avoid exercise or work.Your body is made to move.It is not dangerous to be active, even though your back may hurt.Your back will likely heal faster if you return to being active before your pain is gone.  Pay attention to your body when you bend and lift. Many people have less discomfortwhen lifting if they bend their knees, keep the load close to their bodies,and  avoid twisting. Often, the most comfortable positions are those that put less stress on your recovering back.  Find a comfortable position to sleep. Use a firm mattress and lie on your side with your knees slightly bent. If you lie on your back, put a pillow under your knees.  Only take over-the-counter or prescription medicines as directed by your caregiver. Over-the-counter medicines to reduce pain and inflammation are often the most helpful.Your caregiver may prescribe muscle relaxant drugs.These medicines help dull your pain so you can more quickly return to your normal activities and healthy exercise.  Put ice on the injured area.  Put ice in a plastic bag.  Place a towel between your skin and the bag.  Leave the ice on for 15-20 minutes, 03-04 times a day for the first 2 to 3 days. After that, ice and heat may be alternated to reduce pain and spasms.  Ask your caregiver about trying back exercises and gentle massage. This may be of some benefit.  Avoid feeling anxious or stressed.Stress increases muscle tension and can worsen back pain.It is important to recognize when you are anxious or stressed and learn ways to manage it.Exercise is a great option. SEEK MEDICAL CARE IF:  You have pain that is not relieved with rest or medicine.  You have pain that does not improve in 1 week.  You have new symptoms.  You are generally not feeling well. SEEK   IMMEDIATE MEDICAL CARE IF:   You have pain that radiates from your back into your legs.  You develop new bowel or bladder control problems.  You have unusual weakness or numbness in your arms or legs.  You develop nausea or vomiting.  You develop abdominal pain.  You feel faint. Document Released: 11/24/2005 Document Revised: 05/25/2012 Document Reviewed: 03/28/2014 ExitCare Patient Information 2015 [employer]. This information is not intended to replace advice given to you by your health care provider. Make sure you  discuss any questions you have with your health care provider.  

## 2014-11-04 NOTE — ED Notes (Signed)
Bed: WA19 Expected date:  Expected time:  Means of arrival:  Comments: EMS 

## 2014-11-04 NOTE — ED Notes (Signed)
Pt presents via EMS with c/o back injury that occurred at work. Per EMS, pt was lifting a box of frozen bread and injured her back in the process, hx of same. Pt reporting pain in the lumbar region, non-ambulatory on scene for EMS, 10/10 pain. 20g right ac IV started by EMS, of fentanyl given en route by EMS.

## 2014-11-04 NOTE — ED Notes (Signed)
Pt ambulated out of room and a few steps into the hallway with two person assist. Able to ambulate but very slow with her steps.

## 2014-11-24 ENCOUNTER — Inpatient Hospital Stay (HOSPITAL_COMMUNITY)
Admission: EM | Admit: 2014-11-24 | Discharge: 2014-12-01 | DRG: 440 | Disposition: A | Discharge status: Home / Self Care | Payer: Managed Care, Other (non HMO) | Attending: Internal Medicine | Admitting: Internal Medicine

## 2014-11-24 ENCOUNTER — Encounter (HOSPITAL_COMMUNITY): Payer: Self-pay | Admitting: Emergency Medicine

## 2014-11-24 ENCOUNTER — Emergency Department (HOSPITAL_COMMUNITY): Payer: Managed Care, Other (non HMO)

## 2014-11-24 DIAGNOSIS — R509 Fever, unspecified: Secondary | ICD-10-CM

## 2014-11-24 DIAGNOSIS — E785 Hyperlipidemia, unspecified: Secondary | ICD-10-CM | POA: Diagnosis present

## 2014-11-24 DIAGNOSIS — G8929 Other chronic pain: Secondary | ICD-10-CM | POA: Diagnosis present

## 2014-11-24 DIAGNOSIS — K859 Acute pancreatitis without necrosis or infection, unspecified: Secondary | ICD-10-CM | POA: Diagnosis present

## 2014-11-24 DIAGNOSIS — G43909 Migraine, unspecified, not intractable, without status migrainosus: Secondary | ICD-10-CM | POA: Diagnosis present

## 2014-11-24 DIAGNOSIS — M25519 Pain in unspecified shoulder: Secondary | ICD-10-CM

## 2014-11-24 DIAGNOSIS — R109 Unspecified abdominal pain: Secondary | ICD-10-CM

## 2014-11-24 DIAGNOSIS — Z87891 Personal history of nicotine dependence: Secondary | ICD-10-CM | POA: Diagnosis not present

## 2014-11-24 DIAGNOSIS — R740 Nonspecific elevation of levels of transaminase and lactic acid dehydrogenase [LDH]: Secondary | ICD-10-CM | POA: Diagnosis present

## 2014-11-24 DIAGNOSIS — R1012 Left upper quadrant pain: Secondary | ICD-10-CM

## 2014-11-24 DIAGNOSIS — K858 Other acute pancreatitis: Secondary | ICD-10-CM

## 2014-11-24 HISTORY — DX: Other chronic pain: G89.29

## 2014-11-24 HISTORY — DX: Dorsalgia, unspecified: M54.9

## 2014-11-24 LAB — URINALYSIS, ROUTINE W REFLEX MICROSCOPIC
Bilirubin Urine: NEGATIVE
Glucose, UA: NEGATIVE mg/dL
Hgb urine dipstick: NEGATIVE
KETONES UR: 15 mg/dL — AB
NITRITE: NEGATIVE
Protein, ur: NEGATIVE mg/dL
Specific Gravity, Urine: 1.03 (ref 1.005–1.030)
UROBILINOGEN UA: 0.2 mg/dL (ref 0.0–1.0)
pH: 5 (ref 5.0–8.0)

## 2014-11-24 LAB — COMPREHENSIVE METABOLIC PANEL
ALBUMIN: 3.6 g/dL (ref 3.5–5.2)
ALT: 26 U/L (ref 0–35)
ANION GAP: 11 (ref 5–15)
AST: 34 U/L (ref 0–37)
Alkaline Phosphatase: 113 U/L (ref 39–117)
BUN: 12 mg/dL (ref 6–23)
CALCIUM: 9.1 mg/dL (ref 8.4–10.5)
CHLORIDE: 102 meq/L (ref 96–112)
CO2: 27 meq/L (ref 19–32)
CREATININE: 0.57 mg/dL (ref 0.50–1.10)
GFR calc Af Amer: >90 mL/min (ref >90)
Glucose, Bld: 101 mg/dL — ABNORMAL HIGH (ref 70–99)
Potassium: 4 mEq/L (ref 3.7–5.3)
Sodium: 140 mEq/L (ref 137–147)
Total Bilirubin: 0.3 mg/dL (ref 0.3–1.2)
Total Protein: 6.9 g/dL (ref 6.0–8.3)

## 2014-11-24 LAB — CBC WITH DIFFERENTIAL/PLATELET
BASOS PCT: 0 % (ref 0–1)
Basophils Absolute: 0 10*3/uL (ref 0.0–0.1)
Eosinophils Absolute: 0.1 10*3/uL (ref 0.0–0.7)
Eosinophils Relative: 2 % (ref 0–5)
HCT: 34.5 % — ABNORMAL LOW (ref 36.0–46.0)
HEMOGLOBIN: 11.5 g/dL — AB (ref 12.0–15.0)
Lymphocytes Relative: 19 % (ref 12–46)
Lymphs Abs: 1.4 10*3/uL (ref 0.7–4.0)
MCH: 27.9 pg (ref 26.0–34.0)
MCHC: 33.3 g/dL (ref 30.0–36.0)
MCV: 83.7 fL (ref 78.0–100.0)
MONO ABS: 0.6 10*3/uL (ref 0.1–1.0)
MONOS PCT: 8 % (ref 3–12)
NEUTROS ABS: 5.2 10*3/uL (ref 1.7–7.7)
Neutrophils Relative %: 71 % (ref 43–77)
Platelets: 205 10*3/uL (ref 150–400)
RBC: 4.12 MIL/uL (ref 3.87–5.11)
RDW: 12.8 % (ref 11.5–15.5)
WBC: 7.3 10*3/uL (ref 4.0–10.5)

## 2014-11-24 LAB — URINE MICROSCOPIC-ADD ON

## 2014-11-24 LAB — LIPASE, BLOOD: LIPASE: 14 U/L (ref 11–59)

## 2014-11-24 MED ORDER — SENNA 8.6 MG PO TABS
2.0000 | ORAL_TABLET | Freq: Every day | ORAL | Status: DC
  Administered 2014-11-24 – 2014-11-30 (×7): 17.2 mg via ORAL
  Filled 2014-11-24 (×8): qty 2

## 2014-11-24 MED ORDER — ONDANSETRON HCL 4 MG PO TABS
4.0000 mg | ORAL_TABLET | Freq: Four times a day (QID) | ORAL | Status: DC | PRN
  Filled 2014-11-24: qty 1

## 2014-11-24 MED ORDER — SODIUM CHLORIDE 0.9 % IV SOLN
INTRAVENOUS | Status: DC
  Administered 2014-11-24 – 2014-11-26 (×4): via INTRAVENOUS
  Administered 2014-11-27: 125 mL/h via INTRAVENOUS
  Administered 2014-11-27 – 2014-11-29 (×4): via INTRAVENOUS
  Administered 2014-11-30 (×2): 1000 mL via INTRAVENOUS
  Administered 2014-12-01 (×2): via INTRAVENOUS

## 2014-11-24 MED ORDER — BUTALBITAL-APAP-CAFFEINE 50-325-40 MG PO TABS
1.0000 | ORAL_TABLET | Freq: Four times a day (QID) | ORAL | Status: DC | PRN
  Administered 2014-11-24 – 2014-11-25 (×3): 1 via ORAL
  Administered 2014-11-26 – 2014-11-29 (×2): 2 via ORAL
  Filled 2014-11-24 (×2): qty 1
  Filled 2014-11-24: qty 2
  Filled 2014-11-24: qty 1
  Filled 2014-11-24: qty 2

## 2014-11-24 MED ORDER — IOHEXOL 300 MG/ML  SOLN
25.0000 mL | Freq: Once | INTRAMUSCULAR | Status: AC | PRN
  Administered 2014-11-24: 25 mL via ORAL

## 2014-11-24 MED ORDER — ONDANSETRON HCL 4 MG/2ML IJ SOLN
4.0000 mg | Freq: Four times a day (QID) | INTRAMUSCULAR | Status: DC | PRN
  Administered 2014-11-24 – 2014-11-29 (×4): 4 mg via INTRAVENOUS
  Filled 2014-11-24 (×4): qty 2

## 2014-11-24 MED ORDER — HYDROMORPHONE HCL 1 MG/ML IJ SOLN
2.0000 mg | INTRAMUSCULAR | Status: DC | PRN
  Administered 2014-11-24 – 2014-12-01 (×28): 2 mg via INTRAVENOUS
  Filled 2014-11-24 (×29): qty 2

## 2014-11-24 MED ORDER — POLYETHYLENE GLYCOL 3350 17 G PO PACK
17.0000 g | PACK | Freq: Every day | ORAL | Status: DC
  Administered 2014-11-25 – 2014-11-27 (×2): 17 g via ORAL
  Filled 2014-11-24 (×8): qty 1

## 2014-11-24 MED ORDER — HYDROMORPHONE HCL ER 8 MG PO T24A
32.0000 mg | EXTENDED_RELEASE_TABLET | ORAL | Status: DC
  Administered 2014-11-25 – 2014-12-01 (×7): 32 mg via ORAL
  Filled 2014-11-24 (×7): qty 4

## 2014-11-24 MED ORDER — GUAIFENESIN-DM 100-10 MG/5ML PO SYRP
5.0000 mL | ORAL_SOLUTION | ORAL | Status: DC | PRN
  Filled 2014-11-24: qty 5

## 2014-11-24 MED ORDER — ACETAMINOPHEN 650 MG RE SUPP: 650.0000 mg | Freq: Four times a day (QID) | RECTAL | Status: DC | PRN

## 2014-11-24 MED ORDER — ONDANSETRON HCL 4 MG/2ML IJ SOLN
4.0000 mg | Freq: Once | INTRAMUSCULAR | Status: AC
  Administered 2014-11-24: 4 mg via INTRAVENOUS
  Filled 2014-11-24: qty 2

## 2014-11-24 MED ORDER — HYDROMORPHONE HCL 1 MG/ML IJ SOLN
2.0000 mg | Freq: Once | INTRAMUSCULAR | Status: AC
  Administered 2014-11-24: 2 mg via INTRAVENOUS
  Filled 2014-11-24: qty 2

## 2014-11-24 MED ORDER — FENTANYL CITRATE 0.05 MG/ML IJ SOLN
100.0000 ug | Freq: Once | INTRAMUSCULAR | Status: AC
  Administered 2014-11-24: 100 ug via INTRAVENOUS
  Filled 2014-11-24: qty 2

## 2014-11-24 MED ORDER — IOHEXOL 300 MG/ML  SOLN
100.0000 mL | Freq: Once | INTRAMUSCULAR | Status: AC | PRN
  Administered 2014-11-24: 100 mL via INTRAVENOUS

## 2014-11-24 MED ORDER — ACETAMINOPHEN 325 MG PO TABS: 650.0000 mg | ORAL_TABLET | Freq: Four times a day (QID) | ORAL | Status: DC | PRN

## 2014-11-24 MED ORDER — HYDROMORPHONE HCL 1 MG/ML IJ SOLN
1.0000 mg | Freq: Once | INTRAMUSCULAR | Status: AC
  Administered 2014-11-24: 1 mg via INTRAVENOUS
  Filled 2014-11-24: qty 1

## 2014-11-24 MED ORDER — ALBUTEROL SULFATE (2.5 MG/3ML) 0.083% IN NEBU: 2.5000 mg | INHALATION_SOLUTION | RESPIRATORY_TRACT | Status: DC | PRN

## 2014-11-24 MED ORDER — ENOXAPARIN SODIUM 40 MG/0.4ML ~~LOC~~ SOLN
40.0000 mg | SUBCUTANEOUS | Status: DC
  Administered 2014-11-24 – 2014-11-30 (×7): 40 mg via SUBCUTANEOUS
  Filled 2014-11-24 (×8): qty 0.4

## 2014-11-24 MED ORDER — SODIUM CHLORIDE 0.9 % IV BOLUS (SEPSIS)
500.0000 mL | Freq: Once | INTRAVENOUS | Status: AC
  Administered 2014-11-24: 500 mL via INTRAVENOUS

## 2014-11-24 MED ORDER — CLONAZEPAM 1 MG PO TABS
1.0000 mg | ORAL_TABLET | Freq: Every day | ORAL | Status: DC
  Administered 2014-11-24 – 2014-11-30 (×6): 1 mg via ORAL
  Filled 2014-11-24 (×7): qty 1

## 2014-11-24 MED ORDER — PANTOPRAZOLE SODIUM 40 MG IV SOLR
40.0000 mg | INTRAVENOUS | Status: DC
  Administered 2014-11-24 – 2014-11-30 (×7): 40 mg via INTRAVENOUS
  Filled 2014-11-24 (×8): qty 40

## 2014-11-24 MED ORDER — ALUM & MAG HYDROXIDE-SIMETH 200-200-20 MG/5ML PO SUSP
30.0000 mL | Freq: Four times a day (QID) | ORAL | Status: DC | PRN
  Administered 2014-11-25: 30 mL via ORAL
  Filled 2014-11-24: qty 30

## 2014-11-24 NOTE — Progress Notes (Signed)
Report called to ED  

## 2014-11-24 NOTE — ED Notes (Signed)
Pt remains monitored by blood pressure, pulse ox, and 5 lead.  

## 2014-11-24 NOTE — ED Notes (Signed)
Pt c/o abd pain and some acid reflux; pt sts hx of gallbladder out in 2010 and has bile duct stent; pt c/o increased pain over last 3-4 days; pt denies N/V/D

## 2014-11-24 NOTE — Progress Notes (Signed)
[name] [id number] Code Status: Full   Admission Data: 11/24/2014 6:24 PM Attending Provider:  Jerral RalphGhimire PCP:No primary care provider on file. Consults/ Treatment Team:    [name] is a 48 y.o. female patient admitted from ED awake, alert - oriented  X 3 - no acute distress noted.  VSS - Blood pressure 130/75, pulse 78, temperature 98.6 F (37 C), temperature source Oral, resp. rate 20, last menstrual period 05/16/2012, SpO2 97 %.    IV in place, occlusive dsg intact without redness.  Orientation to room, and floor completed with information packet given to patient/family.  Patient watched safety video at this time.  Admission INP armband ID verified with patient/family, and in place.   SR up x 2, fall assessment complete, with patient and family able to verbalize understanding of risk associated with falls, and verbalized understanding to call nsg before up out of bed.  Call light within reach, patient able to voice, and demonstrate understanding.     Will cont to eval and treat per MD orders.  Al DecantFlores, Aleczander Fandino F, [place]RN 11/24/2014 6:24 PM

## 2014-11-24 NOTE — ED Notes (Signed)
CT notified pt is done with contrast.

## 2014-11-24 NOTE — ED Notes (Signed)
EDP at bedside  

## 2014-11-24 NOTE — ED Notes (Signed)
Patient transported to X-ray 

## 2014-11-24 NOTE — H&P (Signed)
PATIENT DETAILS Name: Ann [name] Age: 48 y.o. Sex: female Date of Birth: [birth year] Admit Date: 11/24/2014 PCP:No primary care provider on file.   CHIEF COMPLAINT:  Abd pain and nausea for 3-4 days  HPI: Ann [name] is a 48 y.o. 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 Per patient, she has chronic abdominal pain and back pain for which she takes chronic narcotics at home which includes exalgo 32 mg daily, and uses 20 mg of oxycodone for breakthrough pain. Over the past 3-4 days she has had significant worsening of her pain in the epigastric area, associated with nausea. Pain constant, dull in nature and 8/10 at its worse. No diarrhea. She subsequently presented to the emergency room today, where a CT scan of the abdomen showed new ill-defined enlargement of the pancreatic head with surrounding ill-defined fluid and multiple peri-pancreatic lymph nodes. This M.D. spoke with patient's primary gastroenterologist Dr. Carla DrapeJohn Sweeney at [facility]-he last saw the patient in 2012, and suggested that we consult gastroenterology here for a EUS. Patient is now being admitted for further evaluation and treatment.   ALLERGIES:   Allergies  Allergen Reactions  . Imitrex [Sumatriptan] Anaphylaxis and Rash  . Doxycycline Diarrhea    Very severe diarrhea   . Lyrica [Pregabalin] Swelling and Other (See Comments)    Swelling of legs and ankles Blurry Vision   . Amoxicillin-Pot Clavulanate Diarrhea and Rash  . Butrans [Buprenorphine] Hives, Swelling and Rash    Swelling of ankles     PAST MEDICAL HISTORY: Past Medical History  Diagnosis Date  . Chronic back pain     PAST SURGICAL HISTORY: Past Surgical History  Procedure Laterality Date  . Bile duct stent placement    . Cholecystectomy        MEDICATIONS AT HOME: Prior to Admission medications   Medication Sig Start Date End Date Taking? Authorizing Provider  Armodafinil (NUVIGIL) 250 MG tablet Take 250 mg by mouth daily.   Yes Historical Provider, MD  butalbital-acetaminophen-caffeine (FIORICET, ESGIC) 50-325-40 MG per tablet Take 1-2 tablets by mouth every 6 (six) hours as needed for headache or migraine.   Yes Historical Provider, MD  clonazePAM (KLONOPIN) 1 MG tablet Take 1 mg by mouth at bedtime.   Yes Historical Provider, MD  HYDROmorphone HCl (EXALGO) 32 MG T24A Take 32 mg by mouth daily.   Yes Historical Provider, MD  Naproxen Sodium (ALEVE PO) Take 2 tablets by mouth daily as needed (pain).   Yes Historical Provider, MD  Oxycodone HCl 20 MG TABS Take 1 tablet by mouth 4 (four) times daily.   Yes Historical Provider, MD  diazepam (VALIUM) 5 MG tablet Take 1 tablet (5 mg total) by mouth every 8 (eight) hours as needed for anxiety. Patient not taking: Reported on 11/24/2014 11/04/14   Roxy Horsemanobert Browning, PA-C    FAMILY HISTORY: History reviewed. No pertinent family history.  SOCIAL HISTORY:  reports that she has quit smoking. She does not have any smokeless tobacco history on file. She reports that she does not drink alcohol or use illicit drugs.  REVIEW OF SYSTEMS:  Constitutional:   No  weight loss, night sweats,  Fevers, chills, fatigue.  HEENT:    No headaches, Difficulty swallowing,Tooth/dental problems,Sore throat,   Cardio-vascular: No chest pain,  Orthopnea, PND, swelling in lower extremities, anasarca,  dizziness, palpitations  GI:  No heartburn, indigestion, vomiting, diarrhea, change in  bowel habits, loss of appetite  Resp: No shortness of breath with exertion or at rest.  No excess mucus, no productive cough, No non-productive cough.  Skin:  no rash or lesions.  GU:  no dysuria, change in color of urine, no urgency or frequency.  No flank pain.  Musculoskeletal: No joint pain or swelling.   No decreased range of motion.  No back pain.  Psych: No change in mood or affect. No depression or anxiety.  No memory loss.   PHYSICAL EXAM: Blood pressure 126/95, pulse 85, temperature 99.1 F (37.3 C), temperature source Oral, resp. rate 18, last menstrual period 05/16/2012, SpO2 95 %.  General appearance :Awake, alert, not in any distress. Speech Clear. Not toxic Looking HEENT: Atraumatic and Normocephalic, pupils equally reactive to light and accomodation Neck: supple, no JVD. No cervical lymphadenopathy.  Chest:Good air entry bilaterally, no added sounds  CVS: S1 S2 regular, no murmurs.  Abdomen: Bowel sounds present, tender mostly in the epigastric area, abdomen is soft and not distended with no gaurding, rigidity or rebound. Extremities: B/L Lower Ext shows no edema, both legs are warm to touch Neurology: Awake alert, and oriented X 3, CN II-XII intact, Non focal Skin:No Rash Wounds:N/A  LABS ON ADMISSION:   Recent Labs  11/24/14 1005  NA 140  K 4.0  CL 102  CO2 27  GLUCOSE 101*  BUN 12  CREATININE 0.57  CALCIUM 9.1    Recent Labs  11/24/14 1005  AST 34  ALT 26  ALKPHOS 113  BILITOT 0.3  PROT 6.9  ALBUMIN 3.6    Recent Labs  11/24/14 1005  LIPASE 14    Recent Labs  11/24/14 1005  WBC 7.3  NEUTROABS 5.2  HGB 11.5*  HCT 34.5*  MCV 83.7  PLT 205   No results for input(s): CKTOTAL, CKMB, CKMBINDEX, TROPONINI in the last 72 hours. No results for input(s): DDIMER in the last 72 hours. Invalid input(s): POCBNP   RADIOLOGIC STUDIES ON ADMISSION: Ct Abdomen Pelvis W Contrast  11/24/2014   CLINICAL DATA:  Abdominal pain with acid reflux. History of gallbladder surgery and biliary stenting. Worsening pain over the last 4 days. Initial encounter.  EXAM: CT ABDOMEN AND PELVIS WITH CONTRAST  TECHNIQUE: Multidetector CT imaging of the abdomen and pelvis was performed using the standard protocol following bolus administration of intravenous contrast.   CONTRAST:  100mL OMNIPAQUE IOHEXOL 300 MG/ML  SOLN  COMPARISON:  Abdominal CT 06/13/2011. Acute abdominal series done 11/24/2014.  FINDINGS: Lower chest: There is mild atelectasis at both lung bases. No confluent airspace opacity or pleural effusion demonstrated. There is mild distal esophageal wall thickening associated with a small hiatal hernia.  Hepatobiliary: The liver is normal in density without focal abnormality. There is mildly progressive intrahepatic biliary dilatation. Pneumobilia remains within the left hepatic lobe. The previously demonstrated plastic biliary stent has been exchanged for a metallic wall stent. There is high-density superiorly within the lumen of the stent. There is some air within the lumen of the stent. There is no reflux of contrast from the duodenum into the stent.  Pancreas: There is new ill-defined enlargement of the pancreatic head without well-defined mass. There is no pancreatic ductal dilatation. There is retroperitoneal edema surrounding the pancreatic head and proximal duodenum. There are adjacent mildly enlarged lymph nodes, measuring up to 11 mm on axial image 30 and 11 mm on coronal image number 45.  Spleen: Stable mild prominence without focal abnormality.  Adrenals/Urinary Tract: Both adrenal glands appear normal.The kidneys appear normal without evidence of urinary tract calculus or hydronephrosis. No bladder abnormalities are seen.  Stomach/Bowel: No evidence of bowel wall thickening, distention or surrounding inflammatory change.As above, there is ill-defined fluid surrounding the pancreatic head and proximal duodenum. No other extraluminal fluid collections demonstrated.  Vascular/Lymphatic: As above, there are prominent lymph node surrounding the pancreatic head and within the porta hepatis. There is no lower retroperitoneal or pelvic lymphadenopathy. Mild aortoiliac atherosclerosis appears stable.  Reproductive: The uterus and ovaries demonstrate no significant  findings.  Other: No evidence of abdominal wall mass or hernia.  Musculoskeletal: No acute or significant osseous findings.  IMPRESSION: 1. Interval exchange of a plastic biliary stent for a metallic wall stent (since 2012). The stent is patent manifesting as persistent intrahepatic pneumobilia. There is mildly increased biliary dilatation. Recent LFTs remain normal. 2. New ill-defined enlargement of the pancreatic head with surrounding ill-defined fluid and multiple peripancreatic lymph nodes. These findings are suspicious for pancreatitis. Neoplasm cannot be excluded, although is less likely given the chronicity of the biliary stent. Correlate clinically. 3. Mild distal esophageal wall thickening as can be seen with chronic reflux.   Electronically Signed   By: Roxy Horseman M.D.   On: 11/24/2014 14:10   Dg Abd Acute W/chest  11/24/2014   CLINICAL DATA:  Sharp mid abdominal pain, left lower quadrant pain for 4 days. Chills.  EXAM: ACUTE ABDOMEN SERIES (ABDOMEN 2 VIEW & CHEST 1 VIEW)  COMPARISON:  None.  FINDINGS: Prior cholecystectomy. Metallic stent is seen to the right of midline, likely biliary stent. Nonobstructive bowel gas pattern. No free air. No suspicious calcification. No acute bony abnormality.  Heart and mediastinal contours are within normal limits. No focal opacities or effusions. No acute bony abnormality.  IMPRESSION: No evidence of bowel obstruction or free air.  No acute findings.   Electronically Signed   By: Charlett Nose M.D.   On: 11/24/2014 10:56     EKG: Independently reviewed. Normal sinus rhythm  ASSESSMENT AND PLAN: Present on Admission:  . 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 CT of the head shows enlargement of the pancreatic head with peri-pancreatic lymph nodes, however no  elevation in LFTs and lipase. She status post cholecystectomy. I will now treat this as acute pancreatitis of unknown etiology with supportive care. Gastroenterology-Dr. Dulce Sellar has been consulted,he will provide further recommendations.  Further plan will depend as patient's clinical course evolves and further radiologic and laboratory data become available. Patient will be monitored closely.  Above noted plan was discussed with patient,he was in agreement.   DVT Prophylaxis: Prophylactic Lovenox   Code Status: Full Code  Disposition Plan: Home when able    Total time spent for admission equals 45 minutes.  [facility] Pager [phone]  If 7PM-7AM, please contact night-coverage [email or web address] Password [email or web address] 11/24/2014, 4:32 PM

## 2014-11-24 NOTE — ED Provider Notes (Signed)
CSN: [id number]     Arrival date & time 11/24/14  0945 History   First MD Initiated Contact with Patient 11/24/14 [phone]     Chief Complaint  Patient presents with  . Abdominal Pain     (Consider location/radiation/quality/duration/timing/severity/associated sxs/prior Treatment) Patient is a 48 y.o. female presenting with abdominal pain. The history is provided by the patient.  Abdominal Pain Associated symptoms: no chest pain, no diarrhea, no nausea, no shortness of breath and no vomiting    patient presents with upper abdominal pain. It began for 5 days ago and was initially episodic but now is constant. Is worse. It is constant. It is dull. No nausea vomiting. No fevers. No dysuria. She states she did have some dysuria a month ago but resolved. No fevers no chills. 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 She states the pain radiates to her left back/shoulder area. No change with eating.  Past Medical History  Diagnosis Date  . Chronic back pain    Past Surgical History  Procedure Laterality Date  . Bile duct stent placement    . Cholecystectomy     History reviewed. No pertinent family history. History  Substance Use Topics  . Smoking status: Former Games developermoker  . Smokeless tobacco: Not on file  . Alcohol Use: No   OB History    No data available     Review of Systems  Constitutional: Negative for activity change and appetite change.  Eyes: Negative for pain.  Respiratory: Negative for chest tightness and shortness of breath.   Cardiovascular: Negative for chest pain and leg swelling.  Gastrointestinal: Positive for abdominal pain. Negative for nausea, vomiting and diarrhea.  Genitourinary: Negative for flank pain.  Musculoskeletal: Negative for back pain and neck stiffness.  Skin: Negative for rash.  Neurological: Negative for weakness, numbness and headaches.  Psychiatric/Behavioral: Negative for behavioral problems.       Allergies  Imitrex; Doxycycline; Lyrica; Amoxicillin-pot clavulanate; and Butrans  Home Medications   Prior to Admission medications   Medication Sig Start Date End Date Taking? Authorizing Provider  Armodafinil (NUVIGIL) 250 MG tablet Take 250 mg by mouth daily.   Yes Historical Provider, MD  butalbital-acetaminophen-caffeine (FIORICET, ESGIC) 50-325-40 MG per tablet Take 1-2 tablets by mouth every 6 (six) hours as needed for headache or migraine.   Yes Historical Provider, MD  clonazePAM (KLONOPIN) 1 MG tablet Take 1 mg by mouth at bedtime.   Yes Historical Provider, MD  HYDROmorphone HCl (EXALGO) 32 MG T24A Take 32 mg by mouth daily.   Yes Historical Provider, MD  Naproxen Sodium (ALEVE PO) Take 2 tablets by mouth daily as needed (pain).   Yes Historical Provider, MD  Oxycodone HCl 20 MG TABS Take 1 tablet by mouth 4 (four) times daily.   Yes Historical Provider, MD  diazepam (VALIUM) 5 MG tablet Take 1 tablet (5 mg total) by mouth every 8 (eight) hours as needed for anxiety. Patient not taking: Reported on 11/24/2014 11/04/14   Roxy Horsemanobert Browning, PA-C   BP 126/95 mmHg  Pulse 85  Temp(Src) 99.1 F (37.3 C) (Oral)  Resp 18  SpO2 95%  LMP 05/16/2012 Physical Exam  Constitutional: She appears well-developed and well-nourished.  Patient appears uncomfortable  HENT:  Head: Normocephalic.  Eyes: Pupils are equal, round, and reactive to light.  Neck: Neck supple.  Pulmonary/Chest: Effort normal.  Abdominal: She exhibits distension. There is tenderness.  Mild distention. Moderate tenderness in upper abdomen, worse in  left upper quadrant. No hernias.  Genitourinary:  No CVA tenderness-  Musculoskeletal: Normal range of motion.    ED Course  Procedures (including critical care time) Labs Review Labs Reviewed  CBC WITH DIFFERENTIAL - Abnormal; Notable for the following:    Hemoglobin 11.5 (*)    HCT 34.5 (*)    All other components within normal limits  COMPREHENSIVE  METABOLIC PANEL - Abnormal; Notable for the following:    Glucose, Bld 101 (*)    All other components within normal limits  URINALYSIS, ROUTINE W REFLEX MICROSCOPIC - Abnormal; Notable for the following:    Ketones, ur 15 (*)    Leukocytes, UA SMALL (*)    All other components within normal limits  URINE MICROSCOPIC-ADD ON - Abnormal; Notable for the following:    Squamous Epithelial / LPF MANY (*)    Bacteria, UA FEW (*)    All other components within normal limits  LIPASE, BLOOD    Imaging Review Ct Abdomen Pelvis W Contrast  11/24/2014   CLINICAL DATA:  Abdominal pain with acid reflux. History of gallbladder surgery and biliary stenting. Worsening pain over the last 4 days. Initial encounter.  EXAM: CT ABDOMEN AND PELVIS WITH CONTRAST  TECHNIQUE: Multidetector CT imaging of the abdomen and pelvis was performed using the standard protocol following bolus administration of intravenous contrast.  CONTRAST:  100mL OMNIPAQUE IOHEXOL 300 MG/ML  SOLN  COMPARISON:  Abdominal CT 06/13/2011. Acute abdominal series done 11/24/2014.  FINDINGS: Lower chest: There is mild atelectasis at both lung bases. No confluent airspace opacity or pleural effusion demonstrated. There is mild distal esophageal wall thickening associated with a small hiatal hernia.  Hepatobiliary: The liver is normal in density without focal abnormality. There is mildly progressive intrahepatic biliary dilatation. Pneumobilia remains within the left hepatic lobe. The previously demonstrated plastic biliary stent has been exchanged for a metallic wall stent. There is high-density superiorly within the lumen of the stent. There is some air within the lumen of the stent. There is no reflux of contrast from the duodenum into the stent.  Pancreas: There is new ill-defined enlargement of the pancreatic head without well-defined mass. There is no pancreatic ductal dilatation. There is retroperitoneal edema surrounding the pancreatic head and  proximal duodenum. There are adjacent mildly enlarged lymph nodes, measuring up to 11 mm on axial image 30 and 11 mm on coronal image number 45.  Spleen: Stable mild prominence without focal abnormality.  Adrenals/Urinary Tract: Both adrenal glands appear normal.The kidneys appear normal without evidence of urinary tract calculus or hydronephrosis. No bladder abnormalities are seen.  Stomach/Bowel: No evidence of bowel wall thickening, distention or surrounding inflammatory change.As above, there is ill-defined fluid surrounding the pancreatic head and proximal duodenum. No other extraluminal fluid collections demonstrated.  Vascular/Lymphatic: As above, there are prominent lymph node surrounding the pancreatic head and within the porta hepatis. There is no lower retroperitoneal or pelvic lymphadenopathy. Mild aortoiliac atherosclerosis appears stable.  Reproductive: The uterus and ovaries demonstrate no significant findings.  Other: No evidence of abdominal wall mass or hernia.  Musculoskeletal: No acute or significant osseous findings.  IMPRESSION: 1. Interval exchange of a plastic biliary stent for a metallic wall stent (since 2012). The stent is patent manifesting as persistent intrahepatic pneumobilia. There is mildly increased biliary dilatation. Recent LFTs remain normal. 2. New ill-defined enlargement of the pancreatic head with surrounding ill-defined fluid and multiple peripancreatic lymph nodes. These findings are suspicious for pancreatitis. Neoplasm cannot be excluded, although is less  likely given the chronicity of the biliary stent. Correlate clinically. 3. Mild distal esophageal wall thickening as can be seen with chronic reflux.   Electronically Signed   By: Roxy Horseman M.D.   On: 11/24/2014 14:10   Dg Abd Acute W/chest  11/24/2014   CLINICAL DATA:  Sharp mid abdominal pain, left lower quadrant pain for 4 days. Chills.  EXAM: ACUTE ABDOMEN SERIES (ABDOMEN 2 VIEW & CHEST 1 VIEW)  COMPARISON:   None.  FINDINGS: Prior cholecystectomy. Metallic stent is seen to the right of midline, likely biliary stent. Nonobstructive bowel gas pattern. No free air. No suspicious calcification. No acute bony abnormality.  Heart and mediastinal contours are within normal limits. No focal opacities or effusions. No acute bony abnormality.  IMPRESSION: No evidence of bowel obstruction or free air.  No acute findings.   Electronically Signed   By: Charlett Nose M.D.   On: 11/24/2014 10:56     EKG Interpretation   Date/Time:  Friday November 24 2014 10:00:25 EST Ventricular Rate:  89 PR Interval:  152 QRS Duration: 85 QT Interval:  422 QTC Calculation: 513 R Axis:   -49 Text Interpretation:  Sinus rhythm Inferior infarct, age indeterminate  Anteroseptal infarct, age indeterminate Prolonged QT interval baseline  wander in I and II Confirmed by Advit Trethewey  MD, Juanita Streight [phone]) on  11/24/2014 10:20:41 AM      MDM   Final diagnoses:  LUQ abdominal pain    Patient with abdominal pain. Severe. Unrelieved by her high-dose chronic pain medicine. Lab work reassuring with possible UTI, however CT scan done due to tenderness and shows possible pancreatic disease, mass versus pancreatitis.  Lipase is normal. Has required repeat pain medicines here.  To be seen by internal medicine for possible admission.    Juliet Rude. Rubin Payor, MD 11/24/14 1606

## 2014-11-25 DIAGNOSIS — R109 Unspecified abdominal pain: Secondary | ICD-10-CM

## 2014-11-25 DIAGNOSIS — R748 Abnormal levels of other serum enzymes: Secondary | ICD-10-CM

## 2014-11-25 DIAGNOSIS — G8929 Other chronic pain: Secondary | ICD-10-CM

## 2014-11-25 DIAGNOSIS — K851 Biliary acute pancreatitis: Secondary | ICD-10-CM

## 2014-11-25 LAB — CBC
HCT: 32.1 % — ABNORMAL LOW (ref 36.0–46.0)
HEMOGLOBIN: 10.7 g/dL — AB (ref 12.0–15.0)
MCH: 28.7 pg (ref 26.0–34.0)
MCHC: 33.3 g/dL (ref 30.0–36.0)
MCV: 86.1 fL (ref 78.0–100.0)
Platelets: 185 10*3/uL (ref 150–400)
RBC: 3.73 MIL/uL — ABNORMAL LOW (ref 3.87–5.11)
RDW: 12.7 % (ref 11.5–15.5)
WBC: 5.1 10*3/uL (ref 4.0–10.5)

## 2014-11-25 LAB — LIPID PANEL
CHOL/HDL RATIO: 5 ratio
CHOLESTEROL: 169 mg/dL (ref 0–200)
HDL: 34 mg/dL — ABNORMAL LOW (ref >39)
LDL Cholesterol: 82 mg/dL (ref 0–99)
TRIGLYCERIDES: 266 mg/dL — AB (ref <150)
VLDL: 53 mg/dL — AB (ref 0–40)

## 2014-11-25 LAB — HEPATIC FUNCTION PANEL
ALBUMIN: 2.8 g/dL — AB (ref 3.5–5.2)
ALT: 116 U/L — ABNORMAL HIGH (ref 0–35)
AST: 188 U/L — AB (ref 0–37)
Alkaline Phosphatase: 185 U/L — ABNORMAL HIGH (ref 39–117)
BILIRUBIN TOTAL: 0.9 mg/dL (ref 0.3–1.2)
Bilirubin, Direct: 0.5 mg/dL — ABNORMAL HIGH (ref 0.0–0.3)
Indirect Bilirubin: 0.4 mg/dL (ref 0.3–0.9)
Total Protein: 5.8 g/dL — ABNORMAL LOW (ref 6.0–8.3)

## 2014-11-25 LAB — LIPASE, BLOOD: Lipase: 19 U/L (ref 11–59)

## 2014-11-25 LAB — BASIC METABOLIC PANEL
Anion gap: 11 (ref 5–15)
BUN: 7 mg/dL (ref 6–23)
CHLORIDE: 105 meq/L (ref 96–112)
CO2: 25 mEq/L (ref 19–32)
Calcium: 8.7 mg/dL (ref 8.4–10.5)
Creatinine, Ser: 0.53 mg/dL (ref 0.50–1.10)
GFR calc Af Amer: >90 mL/min (ref >90)
GFR calc non Af Amer: >90 mL/min (ref >90)
GLUCOSE: 85 mg/dL (ref 70–99)
Potassium: 4 mEq/L (ref 3.7–5.3)
Sodium: 141 mEq/L (ref 137–147)

## 2014-11-25 MED ORDER — PROMETHAZINE HCL 25 MG/ML IJ SOLN
6.2500 mg | INTRAMUSCULAR | Status: DC | PRN
  Administered 2014-11-25 – 2014-11-28 (×3): 6.25 mg via INTRAVENOUS
  Filled 2014-11-25 (×3): qty 1

## 2014-11-25 MED ORDER — CETYLPYRIDINIUM CHLORIDE 0.05 % MT LIQD
7.0000 mL | Freq: Two times a day (BID) | OROMUCOSAL | Status: DC
  Administered 2014-11-25 – 2014-12-01 (×12): 7 mL via OROMUCOSAL

## 2014-11-25 NOTE — Progress Notes (Signed)
PROGRESS NOTE  Ann [name] Wale [id number] DOB: August 13, 1966 DOA: 11/24/2014 PCP: No primary care provider on file.  HPI: 48 y.o. 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 CT scan of the patient showed new ill defined enlargement of the pancreatic head with surrounding ill-defined fluid multiple peripancreatic lymph nodes. The admitting M.D. spoke with patient's patient's primary gastroenterologist Dr. Carla DrapeJohn Sweeney at [facility]-he last saw the patient in 2012.   Subjective / 24 H Interval events Continues to have significant abdominal pain this morning, continues to require IV pain medications  Assessment/Plan: Principal Problem:   Acute pancreatitis Active Problems:   Chronic abdominal pain  Acute pancreatitis - lipase was in fact normal presentation, however she fits clinically and radiologically.  the enlargement of the pancreatic head may mean a mass, will defer to gastroenterology for further evaluation means. Dr. Dulce Sellarutlaw will see this patient in consultation. Appreciate input.  - She tells me that she was suspected for primary sclerosing cholangitis, however this was not confirmed on blood work.  - For now continue IV fluids, continue nothing by mouth given persistent significant abdominal pain  Elevated liver enzymes  - LFTs normal last night, alkaline phosphatase, AST and ALT with significant elevation. Bilirubin is normal today, however this may lag behind. Further workup per GI, ?MRI/MRCP as next step  HLD - will need to start on statin once no longer NPO and LFTs better  Migraine HA - continue Fioricet  Diet: Diet NPO time specified Except for: Sips with Meds Fluids:  Normal saline DVT Prophylaxis:  Lovenox  Code Status: Full Code Family Communication:   Discussed with her husband at bedside  Disposition Plan:  Remaining patient  Consultants:   Gastroenterology, Dr. Dulce Sellarutlaw  Procedures:   None   Antibiotics  Anti-infectives    None       Studies  1. Ct Abdomen Pelvis W Contrast 11/24/2014 1. Interval exchange of a plastic biliary stent for a metallic wall stent (since 2012). The stent is patent manifesting as persistent intrahepatic pneumobilia. There is mildly increased biliary dilatation. Recent LFTs remain normal. 2. New ill-defined enlargement of the pancreatic head with surrounding ill-defined fluid and multiple peripancreatic lymph nodes. These findings are suspicious for pancreatitis. Neoplasm cannot be excluded, although is less likely given the chronicity of the biliary stent. Correlate clinically. 3. Mild distal esophageal wall thickening as can be seen with chronic reflux.   2. Dg Abd Acute W/chest 11/24/2014 No evidence of bowel obstruction or free air.  No acute findings.     Objective  Filed Vitals:   11/24/14 1817 11/24/14 2119 11/24/14 2343 11/25/14 0516  BP: 130/75 109/66  104/58  Pulse: 78 77 88 84  Temp: 98.6 F (37 C) 98.7 F (37.1 C)  98.8 F (37.1 C)  TempSrc: Oral Oral  Oral  Resp: 20 15  12   Height: 5\' 6"  (1.676 m)     Weight: 69.854 kg (154 lb)     SpO2: 97% 83% 95% 90%    Intake/Output Summary (Last 24 hours) at 11/25/14 1143 Last data filed at 11/25/14 1025  Gross per 24 hour  Intake   1000 ml  Output    950 ml  Net     50 ml   Filed Weights   11/24/14 1817  Weight: 69.854 kg (154 lb)  Exam:  General:  Pleasant caucasian F in NAD  Cardiovascular: RRR  Respiratory: clear, no wheezing, no crackles  Abdomen: soft, tender to palpation in the epigastric area mostly   MSK: no peripheral edema  Neuro: no focal findings  Data Reviewed: Basic Metabolic Panel:  Recent Labs Lab 11/24/14 1005 11/25/14 0422  NA 140 141  K 4.0 4.0  CL 102 105  CO2 27 25  GLUCOSE 101* 85    BUN 12 7  CREATININE 0.57 0.53  CALCIUM 9.1 8.7   Liver Function Tests:  Recent Labs Lab 11/24/14 1005 11/25/14 0422  AST 34 188*  ALT 26 116*  ALKPHOS 113 185*  BILITOT 0.3 0.9  PROT 6.9 5.8*  ALBUMIN 3.6 2.8*    Recent Labs Lab 11/24/14 1005 11/25/14 0422  LIPASE 14 19   CBC:  Recent Labs Lab 11/24/14 1005 11/25/14 0422  WBC 7.3 5.1  NEUTROABS 5.2  --   HGB 11.5* 10.7*  HCT 34.5* 32.1*  MCV 83.7 86.1  PLT 205 185   Scheduled Meds: . antiseptic oral rinse  7 mL Mouth Rinse BID  . clonazePAM  1 mg Oral QHS  . enoxaparin (LOVENOX) injection  40 mg Subcutaneous Q24H  . HYDROmorphone HCl  32 mg Oral Q24H  . pantoprazole (PROTONIX) IV  40 mg Intravenous Q24H  . polyethylene glycol  17 g Oral Daily  . senna  2 tablet Oral QHS   Continuous Infusions: . sodium chloride 125 mL/hr at 11/25/14 1023    Time spent: 35 minutes  Pamella Pertostin Huber Mathers, MD [facility] Pager [phone]. If 7 PM - 7 AM, please contact night-coverage at [email or web address], password [email or web address] 11/25/2014, 11:43 AM  LOS: 1 day

## 2014-11-25 NOTE — Progress Notes (Signed)
Pt had oxygen saturation 88% on room air. Pt is sleepy and lethargic.  HOB elevated and pt was asked to take deep breath with no improvement . No s/s of distress noted. Pt was placed on 2 liter nasal cannula with saturation 98%. On-call provider  notified via text page and asked if we can hold her sch Klonopin.  Awaiting order.

## 2014-11-25 NOTE — Consult Note (Signed)
[facility] Consultation Note  Referring Provider: Dr. Jeoffrey Massed [facility]) Primary Care Physician:  No primary care provider on file. Primary Gastroenterologist:  Dr. Carla Drape  Reason for Consultation:  Abdominal pain, abnormal CT abdomen  HPI: Ann [name] is a 48 y.o. female admitted for worsening abdominal pain with abnormal CT scan (edema about head of pancreas with non-pathologic peripancreatic adenopathy).  Patient had no significant stomach problems until she had a cholecystectomy a few years ago.  She tells me that she had post-operative bile leak, requiring extensive percutaneous drains and multiple ERCPs.  Multiple attempts to remove biliary stents resulted in recurrent bile leaks.  In midst of these ERCPs, it was felt by cholangiogram that she might have sclerosing cholangitis.  Given her recurrent bile leaks despite conventional management with plastic biliary stents, she ultimately had wallstent placed.  After this procedure, she had severe abdominal pain, felt she might have pancreatitis, and she was in the hospital for a prolonged hospital course.  Ever since then, patient has had chronic upper abdominal and back pains, and sees a pain clinic for analgesia.  With this background, patient presents with several day history of worsening abdominal pain, same characteristics and location as her prior pain, but much more severe.  Has had some subjective chills, but denies fevers or sweats.  Has been eating ok.  No blood in stool.  Has not lost any weight (in fact, she is gaining weight).  No pruritus or jaundice.   Past Medical History  Diagnosis Date  . Chronic back pain     Past Surgical History  Procedure Laterality Date  . Bile duct stent placement    . Cholecystectomy      Prior to Admission medications   Medication Sig Start Date End Date Taking? Authorizing Provider  Armodafinil (NUVIGIL) 250 MG tablet Take 250 mg by mouth daily.   Yes Historical Provider,  MD  butalbital-acetaminophen-caffeine (FIORICET, ESGIC) 50-325-40 MG per tablet Take 1-2 tablets by mouth every 6 (six) hours as needed for headache or migraine.   Yes Historical Provider, MD  clonazePAM (KLONOPIN) 1 MG tablet Take 1 mg by mouth at bedtime.   Yes Historical Provider, MD  HYDROmorphone HCl (EXALGO) 32 MG T24A Take 32 mg by mouth daily.   Yes Historical Provider, MD  Naproxen Sodium (ALEVE PO) Take 2 tablets by mouth daily as needed (pain).   Yes Historical Provider, MD  Oxycodone HCl 20 MG TABS Take 1 tablet by mouth 4 (four) times daily.   Yes Historical Provider, MD  diazepam (VALIUM) 5 MG tablet Take 1 tablet (5 mg total) by mouth every 8 (eight) hours as needed for anxiety. Patient not taking: Reported on 11/24/2014 11/04/14   Roxy Horseman, PA-C    Current Facility-Administered Medications  Medication Dose Route Frequency Provider Last Rate Last Dose  . 0.9 %  sodium chloride infusion   Intravenous Continuous Maretta Bees, MD 125 mL/hr at 11/25/14 1023    . acetaminophen (TYLENOL) tablet 650 mg  650 mg Oral Q6H PRN Shanker Levora Dredge, MD       Or  . acetaminophen (TYLENOL) suppository 650 mg  650 mg Rectal Q6H PRN Shanker Levora Dredge, MD      . albuterol (PROVENTIL) (2.5 MG/3ML) 0.083% nebulizer solution 2.5 mg  2.5 mg Nebulization Q2H PRN Maretta Bees, MD      . alum & mag hydroxide-simeth (MAALOX/MYLANTA) 200-200-20 MG/5ML suspension 30 mL  30 mL Oral Q6H PRN Shanker Levora Dredge,  MD   30 mL at 11/25/14 1353  . antiseptic oral rinse (CPC / CETYLPYRIDINIUM CHLORIDE 0.05%) solution 7 mL  7 mL Mouth Rinse BID Costin Otelia SergeantM Gherghe, MD   7 mL at 11/25/14 1000  . butalbital-acetaminophen-caffeine (FIORICET, ESGIC) 50-325-40 MG per tablet 1-2 tablet  1-2 tablet Oral Q6H PRN Maretta BeesShanker M Ghimire, MD   1 tablet at 11/25/14 [phone]  . clonazePAM (KLONOPIN) tablet 1 mg  1 mg Oral QHS Maretta BeesShanker M Ghimire, MD   1 mg at 11/24/14 2103  . enoxaparin (LOVENOX) injection 40 mg  40 mg Subcutaneous  Q24H Maretta BeesShanker M Ghimire, MD   40 mg at 11/24/14 2025  . guaiFENesin-dextromethorphan (ROBITUSSIN DM) 100-10 MG/5ML syrup 5 mL  5 mL Oral Q4H PRN Maretta BeesShanker M Ghimire, MD      . HYDROmorphone (DILAUDID) injection 2 mg  2 mg Intravenous Q4H PRN Maretta BeesShanker M Ghimire, MD   2 mg at 11/25/14 1345  . HYDROmorphone HCl (EXALGO) SR tablet 32 mg  32 mg Oral Q24H Maretta BeesShanker M Ghimire, MD   32 mg at 11/25/14 [id number]  . ondansetron (ZOFRAN) tablet 4 mg  4 mg Oral Q6H PRN Maretta BeesShanker M Ghimire, MD       Or  . ondansetron [facility](ZOFRAN) injection 4 mg  4 mg Intravenous Q6H PRN Maretta BeesShanker M Ghimire, MD   4 mg at 11/24/14 1830  . pantoprazole (PROTONIX) injection 40 mg  40 mg Intravenous Q24H Maretta BeesShanker M Ghimire, MD   40 mg at 11/24/14 2028  . polyethylene glycol (MIRALAX / GLYCOLAX) packet 17 g  17 g Oral Daily Maretta BeesShanker M Ghimire, MD   17 g at 11/25/14 1023  . senna (SENOKOT) tablet 17.2 mg  2 tablet Oral QHS Maretta BeesShanker M Ghimire, MD   17.2 mg at 11/24/14 2103    Allergies as of 11/24/2014 - Review Complete 11/24/2014  Allergen Reaction Noted  . Imitrex [sumatriptan] Anaphylaxis and Rash 06/15/2012  . Doxycycline Diarrhea 06/15/2012  . Lyrica [pregabalin] Swelling and Other (See Comments) 06/15/2012  . Amoxicillin-pot clavulanate Diarrhea and Rash 06/15/2012  . Butrans [buprenorphine] Hives, Swelling, and Rash 06/15/2012    History reviewed. No pertinent family history.  History   Social History  . Marital Status: Married    Spouse Name: N/A    Number of Children: N/A  . Years of Education: N/A   Occupational History  . Not on file.   Social History Main Topics  . Smoking status: Former Games developermoker  . Smokeless tobacco: Not on file  . Alcohol Use: No  . Drug Use: No  . Sexual Activity: Not on file   Other Topics Concern  . Not on file   Social History Narrative    Review of Systems: ROS Dr. Jerral RalphGhimire 11/24/14 reviewed and I agree  Physical Exam: Vital signs in last 24 hours: Temp:  [98.6 F (37 C)-98.8 F (37.1 C)]  98.6 F (37 C) (12/19 1358) Pulse Rate:  [76-91] 77 (12/19 1358) Resp:  [12-20] 16 (12/19 1358) BP: (101-130)/(58-95) 123/63 mmHg (12/19 1358) SpO2:  [83 %-97 %] 92 % (12/19 1358) Weight:  [69.854 kg (154 lb)] 69.854 kg (154 lb) (12/18 1817) Last BM Date: 11/24/14 General:   Alert,  Well-developed, well-nourished, pleasant and cooperative in NAD Head:  Normocephalic and atraumatic. Eyes:  Sclera clear, no icterus.   Conjunctiva pink. Ears:  Normal auditory acuity. Nose:  No deformity, discharge,  or lesions. Mouth:  No deformity or lesions.  Oropharynx pink & moist. Neck:  Supple; no masses or  thyromegaly. Lungs:  Clear throughout to auscultation.   No wheezes, crackles, or rhonchi. No acute distress. Heart:  Regular rate and rhythm; no murmurs, clicks, rubs,  or gallops. Abdomen:  Soft, mild upper abdominal tenderness without peritonitis. No masses, hepatosplenomegaly or hernias noted. Normal bowel sounds, without guarding, and without rebound.     Msk:  Symmetrical without gross deformities. Normal posture. Pulses:  Normal pulses noted. Extremities:  Without clubbing or edema. Neurologic:  Alert and  oriented x4;  Diffusely weak, but grossly normal neurologically. Skin:  Intact without significant lesions or rashes. Psych:  Alert and cooperative. Depressed mood, flat affect   Lab Results:  Recent Labs  11/24/14 1005 11/25/14 0422  WBC 7.3 5.1  HGB 11.5* 10.7*  HCT 34.5* 32.1*  PLT 205 185   BMET  Recent Labs  11/24/14 1005 11/25/14 0422  NA 140 141  K 4.0 4.0  CL 102 105  CO2 27 25  GLUCOSE 101* 85  BUN 12 7  CREATININE 0.57 0.53  CALCIUM 9.1 8.7   LFT  Recent Labs  11/25/14 0422  PROT 5.8*  ALBUMIN 2.8*  AST 188*  ALT 116*  ALKPHOS 185*  BILITOT 0.9  BILIDIR 0.5*  IBILI 0.4   PT/INR No results for input(s): LABPROT, INR in the last 72 hours.  Studies/Results: Ct Abdomen Pelvis W Contrast  11/24/2014   CLINICAL DATA:  Abdominal pain with acid  reflux. History of gallbladder surgery and biliary stenting. Worsening pain over the last 4 days. Initial encounter.  EXAM: CT ABDOMEN AND PELVIS WITH CONTRAST  TECHNIQUE: Multidetector CT imaging of the abdomen and pelvis was performed using the standard protocol following bolus administration of intravenous contrast.  CONTRAST:  OMNIPAQUE IOHEXOL 300 MG/ML  SOLN  COMPARISON:  Abdominal CT 06/13/2011. Acute abdominal series done 11/24/2014.  FINDINGS: Lower chest: There is mild atelectasis at both lung bases. No confluent airspace opacity or pleural effusion demonstrated. There is mild distal esophageal wall thickening associated with a small hiatal hernia.  Hepatobiliary: The liver is normal in density without focal abnormality. There is mildly progressive intrahepatic biliary dilatation. Pneumobilia remains within the left hepatic lobe. The previously demonstrated plastic biliary stent has been exchanged for a metallic wall stent. There is high-density superiorly within the lumen of the stent. There is some air within the lumen of the stent. There is no reflux of contrast from the duodenum into the stent.  Pancreas: There is new ill-defined enlargement of the pancreatic head without well-defined mass. There is no pancreatic ductal dilatation. There is retroperitoneal edema surrounding the pancreatic head and proximal duodenum. There are adjacent mildly enlarged lymph nodes, measuring up to 11 mm on axial image 30 and 11 mm on coronal image number 45.  Spleen: Stable mild prominence without focal abnormality.  Adrenals/Urinary Tract: Both adrenal glands appear normal.The kidneys appear normal without evidence of urinary tract calculus or hydronephrosis. No bladder abnormalities are seen.  Stomach/Bowel: No evidence of bowel wall thickening, distention or surrounding inflammatory change.As above, there is ill-defined fluid surrounding the pancreatic head and proximal duodenum. No other extraluminal fluid  collections demonstrated.  Vascular/Lymphatic: As above, there are prominent lymph node surrounding the pancreatic head and within the porta hepatis. There is no lower retroperitoneal or pelvic lymphadenopathy. Mild aortoiliac atherosclerosis appears stable.  Reproductive: The uterus and ovaries demonstrate no significant findings.  Other: No evidence of abdominal wall mass or hernia.  Musculoskeletal: No acute or significant osseous findings.  IMPRESSION: 1. Interval exchange of  a plastic biliary stent for a metallic wall stent (since 2012). The stent is patent manifesting as persistent intrahepatic pneumobilia. There is mildly increased biliary dilatation. Recent LFTs remain normal. 2. New ill-defined enlargement of the pancreatic head with surrounding ill-defined fluid and multiple peripancreatic lymph nodes. These findings are suspicious for pancreatitis. Neoplasm cannot be excluded, although is less likely given the chronicity of the biliary stent. Correlate clinically. 3. Mild distal esophageal wall thickening as can be seen with chronic reflux.   Electronically Signed   By: Roxy HorsemanBill  Veazey M.D.   On: 11/24/2014 14:10   Dg Abd Acute W/chest  11/24/2014   CLINICAL DATA:  Sharp mid abdominal pain, left lower quadrant pain for 4 days. Chills.  EXAM: ACUTE ABDOMEN SERIES (ABDOMEN 2 VIEW & CHEST 1 VIEW)  COMPARISON:  None.  FINDINGS: Prior cholecystectomy. Metallic stent is seen to the right of midline, likely biliary stent. Nonobstructive bowel gas pattern. No free air. No suspicious calcification. No acute bony abnormality.  Heart and mediastinal contours are within normal limits. No focal opacities or effusions. No acute bony abnormality.  IMPRESSION: No evidence of bowel obstruction or free air.  No acute findings.   Electronically Signed   By: Charlett NoseKevin  Dover M.D.   On: 11/24/2014 10:56   Impression:  1.  Recurrent bile leak after cholecystectomy years ago.  Multiple prior biliary stents placed with  recurrence of leak per patient after stent removal, necessitating wallstent placement a few years ago. 2.  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 3.  Chronic abdominal pain.  Unclear etiology.  Started after her wallstent was placed.  Post-wallstent pain (without pancreatitis) is not unusual, but usually resolves after a few days.  On chronic narcotics. 4.  Worsening abdominal pain with abnormal CT scan (thickening of head of pancreas with non-pathologic sized peripancreatic adenopathy).  Unclear etiology.  No fluid collection to suggest bile leak.  Wonder if patient has component of acute on chronic pancreatitis (which might explain her normal lipase).  History is not typical of malignancy, though this can't be entirely excluded. 5.  Mildly elevated LFTs.  Unclear significance.  Plan:  1.  Intravenous fluids, bowel rest, analgesics as needed. 2.  Check Ca 19-9. 3.  See how pain is doing over the next day or two; might consider repeat CT imaging +/- HIDA scan if patient's pain worsens. 4.  Unclear how EUS will help us much at this point, but might consider it down-the-road if no clear etiology into her pain is identified.  I told patient and her husband that this study would be somewhat limited, as there is likely to be significant shadowing artifact in the region of the pancreatic head from the previously placed wallstent. 5.  Will follow.   LOS: 1 day   Linh Johannes M  11/25/2014, 2:01 PM

## 2014-11-26 ENCOUNTER — Inpatient Hospital Stay (HOSPITAL_COMMUNITY): Payer: Managed Care, Other (non HMO)

## 2014-11-26 LAB — URINALYSIS, ROUTINE W REFLEX MICROSCOPIC
Glucose, UA: NEGATIVE mg/dL
HGB URINE DIPSTICK: NEGATIVE
Ketones, ur: >80 mg/dL — AB
Nitrite: NEGATIVE
PH: 5.5 (ref 5.0–8.0)
Protein, ur: NEGATIVE mg/dL
SPECIFIC GRAVITY, URINE: 1.018 (ref 1.005–1.030)
Urobilinogen, UA: 1 mg/dL (ref 0.0–1.0)

## 2014-11-26 LAB — CBC
HEMATOCRIT: 34.8 % — AB (ref 36.0–46.0)
Hemoglobin: 11.4 g/dL — ABNORMAL LOW (ref 12.0–15.0)
MCH: 27.7 pg (ref 26.0–34.0)
MCHC: 32.8 g/dL (ref 30.0–36.0)
MCV: 84.5 fL (ref 78.0–100.0)
Platelets: 225 10*3/uL (ref 150–400)
RBC: 4.12 MIL/uL (ref 3.87–5.11)
RDW: 12.6 % (ref 11.5–15.5)
WBC: 8.8 10*3/uL (ref 4.0–10.5)

## 2014-11-26 LAB — COMPREHENSIVE METABOLIC PANEL
ALT: 206 U/L — ABNORMAL HIGH (ref 0–35)
AST: 243 U/L — ABNORMAL HIGH (ref 0–37)
Albumin: 2.8 g/dL — ABNORMAL LOW (ref 3.5–5.2)
Alkaline Phosphatase: 342 U/L — ABNORMAL HIGH (ref 39–117)
Anion gap: 19 — ABNORMAL HIGH (ref 5–15)
BUN: 9 mg/dL (ref 6–23)
CALCIUM: 8.7 mg/dL (ref 8.4–10.5)
CO2: 15 mEq/L — ABNORMAL LOW (ref 19–32)
CREATININE: 0.48 mg/dL — AB (ref 0.50–1.10)
Chloride: 103 mEq/L (ref 96–112)
GFR calc non Af Amer: >90 mL/min (ref >90)
GLUCOSE: 59 mg/dL — AB (ref 70–99)
POTASSIUM: 4.5 meq/L (ref 3.7–5.3)
Sodium: 137 mEq/L (ref 137–147)
Total Bilirubin: 1 mg/dL (ref 0.3–1.2)
Total Protein: 6.2 g/dL (ref 6.0–8.3)

## 2014-11-26 LAB — CANCER ANTIGEN 19-9: CA 19-9: 24.5 U/mL — ABNORMAL LOW (ref <35.0)

## 2014-11-26 LAB — URINE MICROSCOPIC-ADD ON

## 2014-11-26 MED ORDER — METRONIDAZOLE IN NACL 5-0.79 MG/ML-% IV SOLN
500.0000 mg | Freq: Three times a day (TID) | INTRAVENOUS | Status: DC
  Administered 2014-11-26 – 2014-12-01 (×15): 500 mg via INTRAVENOUS
  Filled 2014-11-26 (×18): qty 100

## 2014-11-26 MED ORDER — WHITE PETROLATUM GEL
Status: AC
  Administered 2014-11-26: 0.2
  Filled 2014-11-26: qty 5

## 2014-11-26 MED ORDER — LEVOFLOXACIN IN D5W 750 MG/150ML IV SOLN
750.0000 mg | INTRAVENOUS | Status: DC
  Administered 2014-11-27 – 2014-12-01 (×5): 750 mg via INTRAVENOUS
  Filled 2014-11-26 (×5): qty 150

## 2014-11-26 MED ORDER — METRONIDAZOLE IN NACL 5-0.79 MG/ML-% IV SOLN
500.0000 mg | Freq: Once | INTRAVENOUS | Status: AC
  Administered 2014-11-26: 500 mg via INTRAVENOUS
  Filled 2014-11-26: qty 100

## 2014-11-26 MED ORDER — IOHEXOL 300 MG/ML  SOLN
100.0000 mL | Freq: Once | INTRAMUSCULAR | Status: AC | PRN
  Administered 2014-11-26: 100 mL via INTRAVENOUS

## 2014-11-26 MED ORDER — LEVOFLOXACIN IN D5W 750 MG/150ML IV SOLN
750.0000 mg | Freq: Once | INTRAVENOUS | Status: AC
  Administered 2014-11-26: 750 mg via INTRAVENOUS
  Filled 2014-11-26: qty 150

## 2014-11-26 NOTE — Progress Notes (Signed)
Pt has fever 102 this morning . On- call Provider Daphane ShepherdM Lynch, NP notified via text page. Awaiting order.

## 2014-11-26 NOTE — Progress Notes (Signed)
Utilization Review Completed.Ann [name] T12/20/2015  

## 2014-11-26 NOTE — Progress Notes (Signed)
PROGRESS NOTE  Ann [name] ZOX:[id number] DOB: 08/18/1966 DOA: 11/24/2014 PCP: No primary care provider on file.  HPI: 48 y.o. 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 CT scan of the patient showed new ill defined enlargement of the pancreatic head with surrounding ill-defined fluid multiple peripancreatic lymph nodes. The admitting M.D. spoke with patient's patient's primary gastroenterologist Dr. Carla [facility]John [name] at [facility]-he last saw the patient in 2012.   Subjective / 24 H Interval events - Ongoing abdominal pain and migraine headaches  Assessment/Plan: Principal Problem:   Acute pancreatitis Active Problems:   Chronic abdominal pain  Acute pancreatitis - lipase was normal on presentation,  - She tells me that she was suspected for primary sclerosing cholangitis, however this was not confirmed on blood work.  - For now continue IV fluids, continue nothing by mouth given persistent significant abdominal pain - Patient febrile last night and started on empiric antibiotics   Elevated liver enzymes  - LFTs normal on admission, alkaline phosphatase, AST and ALT with significant elevation on 12/19 and continued to increase today - Repeat CT scan this afternoon  - GI is considering an ERCP tomorrow   HLD - [name] need to start on statin once no longer NPO and LFTs better  Migraine HA - continue Fioricet  Diet: Diet clear liquid Fluids:  Normal saline DVT Prophylaxis:  Lovenox  Code Status: Full Code Family Communication:  Discussed with her husband at bedside  Disposition Plan:  Remaining patient  Consultants:   Gastroenterology, Dr. Dulce Sellarutlaw  Procedures:   None   Antibiotics levofloxacin 12/20 >> Metronidazole 12/20 >>   Studies  1. Ct Abdomen  Pelvis W Contrast 11/24/2014 1. Interval exchange of a plastic biliary stent for a metallic wall stent (since 2012). The stent is patent manifesting as persistent intrahepatic pneumobilia. There is mildly increased biliary dilatation. Recent LFTs remain normal. 2. New ill-defined enlargement of the pancreatic head with surrounding ill-defined fluid and multiple peripancreatic lymph nodes. These findings are suspicious for pancreatitis. Neoplasm cannot be excluded, although is less likely given the chronicity of the biliary stent. Correlate clinically. 3. Mild distal esophageal wall thickening as can be seen with chronic reflux.   2. Dg Abd Acute W/chest 11/24/2014 No evidence of bowel obstruction or free air.  No acute findings.   Objective  Filed Vitals:   11/25/14 2227 11/26/14 0217 11/26/14 0618 11/26/14 0854  BP:  117/59 120/60   Pulse: 90 93 96   Temp:  98.7 F (37.1 C) 102 F (38.9 C) 99 F (37.2 C)  TempSrc:  Oral Oral Oral  Resp: 16 18 15    Height:      Weight:      SpO2: 97% 94% 91%     Intake/Output Summary (Last 24 hours) at 11/26/14 1239 Last data filed at 11/26/14 1054  Gross per 24 hour  Intake 3320.83 ml  Output   2050 ml  Net 1270.83 ml   Filed Weights   11/24/14 1817  Weight: 69.854 kg (154 lb)   Exam:  General:  Pleasant caucasian F in NAD  Cardiovascular: RRR  Respiratory: clear, no wheezing, no crackles  Abdomen: soft, tender to palpation in the epigastric area mostly   MSK: no peripheral edema  Neuro: no focal findings  Data Reviewed: Basic Metabolic Panel:  Recent Labs  Lab 11/24/14 1005 11/25/14 0422 11/26/14 0437  NA 140 141 137  K 4.0 4.0 4.5  CL 102 105 103  CO2 27 25 15*  GLUCOSE 101* 85 59*  BUN 12 7 9   CREATININE 0.57 0.53 0.48*  CALCIUM 9.1 8.7 8.7   Liver Function Tests:  Recent Labs Lab 11/24/14 1005 11/25/14 0422 11/26/14 0437  AST 34 188* 243*  ALT 26 116* 206*  ALKPHOS 113 185* 342*  BILITOT 0.3 0.9 1.0  PROT  6.9 5.8* 6.2  ALBUMIN 3.6 2.8* 2.8*    Recent Labs Lab 11/24/14 1005 11/25/14 0422  LIPASE 14 19   CBC:  Recent Labs Lab 11/24/14 1005 11/25/14 0422 11/26/14 0437  WBC 7.3 5.1 8.8  NEUTROABS 5.2  --   --   HGB 11.5* 10.7* 11.4*  HCT 34.5* 32.1* 34.8*  MCV 83.7 86.1 84.5  PLT 205 185 225   Scheduled Meds: . antiseptic oral rinse  7 mL Mouth Rinse BID  . clonazePAM  1 mg Oral QHS  . enoxaparin (LOVENOX) injection  40 mg Subcutaneous Q24H  . HYDROmorphone HCl  32 mg Oral Q24H  . [START ON 11/27/2014] levofloxacin (LEVAQUIN) IV  750 mg Intravenous Q24H  . metronidazole  500 mg Intravenous Q8H  . pantoprazole (PROTONIX) IV  40 mg Intravenous Q24H  . polyethylene glycol  17 g Oral Daily  . senna  2 tablet Oral QHS  . white petrolatum       Continuous Infusions: . sodium chloride 125 mL/hr at 11/26/14 [id number]    Time spent: 15 minutes  Pamella Pertostin Samreet Edenfield, MD [facility] Pager [phone]. If 7 PM - 7 AM, please contact night-coverage at [email or web address], password [email or web address] 11/26/2014, 12:39 PM  LOS: 2 days

## 2014-11-26 NOTE — Progress Notes (Signed)
48yo female w/ h/o recurrent bile leak after cholecystectomy and multiple biliary stents c/o worsening abdominal pain, concern for intra-abdominal infection, to begin IV ABX.  Will start Levaquin 750mg  IV Q24H and Flagyl 500mg  IV Q8H and monitor CBC and Cx.  Vernard GamblesVeronda Cashae Weich, PharmD, BCPS 11/26/2014 6:53 AM

## 2014-11-26 NOTE — Progress Notes (Signed)
Subjective: Abdominal pain unchanged for the past couple days. Had fever last night.  Objective: Vital signs in last 24 hours: Temp:  [98.6 F (37 C)-102 F (38.9 C)] 99 F (37.2 C) (12/20 0854) Pulse Rate:  [77-96] 96 (12/20 0618) Resp:  [15-18] 15 (12/20 0618) BP: (117-123)/(59-63) 120/60 mmHg (12/20 0618) SpO2:  [88 %-97 %] 91 % (12/20 0618) Weight change:  Last BM Date: 11/24/14  PE: GEN:  Flat affect, awake/alert, is not acutely toxic-appearing ABD:  Soft, mild epigastric tenderness, active but hypoactive bowel sounds, no peritonitis, no distention  Lab Results: CBC    Component Value Date/Time   WBC 8.8 11/26/2014 0437   RBC 4.12 11/26/2014 0437   HGB 11.4* 11/26/2014 0437   HCT 34.8* 11/26/2014 0437   PLT 225 11/26/2014 0437   MCV 84.5 11/26/2014 0437   MCH 27.7 11/26/2014 0437   MCHC 32.8 11/26/2014 0437   RDW 12.6 11/26/2014 0437   LYMPHSABS 1.4 11/24/2014 1005   MONOABS 0.6 11/24/2014 1005   EOSABS 0.1 11/24/2014 1005   BASOSABS 0.0 11/24/2014 1005   CMP     Component Value Date/Time   NA 137 11/26/2014 0437   K 4.5 11/26/2014 0437   CL 103 11/26/2014 0437   CO2 15* 11/26/2014 0437   GLUCOSE 59* 11/26/2014 0437   BUN 9 11/26/2014 0437   CREATININE 0.48* 11/26/2014 0437   CALCIUM 8.7 11/26/2014 0437   PROT 6.2 11/26/2014 0437   ALBUMIN 2.8* 11/26/2014 0437   AST 243* 11/26/2014 0437   ALT 206* 11/26/2014 0437   ALKPHOS 342* 11/26/2014 0437   BILITOT 1.0 11/26/2014 0437   GFRNONAA >90 11/26/2014 0437   GFRAA >90 11/26/2014 0437   Assessment:  1.  Fevers. 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   2.  Elevated LFTs, normal on admission, slow gradual rise.  Possible smoldering cholangitis (sclerosing cholangitis versus debris in stent). 3.  Abdominal pain.  Seems fairly stable over the past couple days. 4.  Edema and non-pathologic lymphadenopathy around pancreatic head.  Unclear etiology or significance.  Plan:  1.  Agree with antibiotics; now on  metronidazole and levofloxacin. 2.  Follow LFT trend. 3.  Trial of clear liquids. 4.  Awaiting Ca 19-9 level. 5.  If LFTs continue to uptrend, might need to consider ERCP in the next few days to ensure patency of her biliary wallstent.  There is currently no indication for emergent ERCP.  Don't see real utility in endoscopic ultrasound at the present time.  Given patient's possible history of sclerosing cholangitis, would ideally like to avoid doing ERCP, if medical therapy works, given not insignificant risk of precipitating or worsening cholangitis with ERCP in patient's with pre-existing sclerosing cholangitis. 6.  Will follow.   Freddy JakschUTLAW,Ann [name] M 11/26/2014, 11:26 AM

## 2014-11-27 ENCOUNTER — Inpatient Hospital Stay (HOSPITAL_COMMUNITY): Payer: Managed Care, Other (non HMO)

## 2014-11-27 LAB — URINE CULTURE
COLONY COUNT: NO GROWTH
Culture: NO GROWTH

## 2014-11-27 LAB — COMPREHENSIVE METABOLIC PANEL
ALBUMIN: 2.7 g/dL — AB (ref 3.5–5.2)
ALT: 148 U/L — ABNORMAL HIGH (ref 0–35)
ANION GAP: 12 (ref 5–15)
AST: 114 U/L — ABNORMAL HIGH (ref 0–37)
Alkaline Phosphatase: 325 U/L — ABNORMAL HIGH (ref 39–117)
BUN: 4 mg/dL — ABNORMAL LOW (ref 6–23)
CHLORIDE: 104 meq/L (ref 96–112)
CO2: 23 mEq/L (ref 19–32)
CREATININE: 0.48 mg/dL — AB (ref 0.50–1.10)
Calcium: 8.7 mg/dL (ref 8.4–10.5)
GFR calc Af Amer: >90 mL/min (ref >90)
Glucose, Bld: 82 mg/dL (ref 70–99)
Potassium: 3.7 mEq/L (ref 3.7–5.3)
Sodium: 139 mEq/L (ref 137–147)
Total Bilirubin: 0.5 mg/dL (ref 0.3–1.2)
Total Protein: 6.1 g/dL (ref 6.0–8.3)

## 2014-11-27 LAB — CBC
HEMATOCRIT: 32.8 % — AB (ref 36.0–46.0)
Hemoglobin: 10.9 g/dL — ABNORMAL LOW (ref 12.0–15.0)
MCH: 27.6 pg (ref 26.0–34.0)
MCHC: 33.2 g/dL (ref 30.0–36.0)
MCV: 83 fL (ref 78.0–100.0)
PLATELETS: 203 10*3/uL (ref 150–400)
RBC: 3.95 MIL/uL (ref 3.87–5.11)
RDW: 12.6 % (ref 11.5–15.5)
WBC: 4.9 10*3/uL (ref 4.0–10.5)

## 2014-11-27 NOTE — Progress Notes (Signed)
PROGRESS NOTE  Ann [name] RUE:[id number] DOB: 02/05/1966 DOA: 11/24/2014 PCP: No primary care provider on file.  HPI: 48 y.o. 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 CT scan of the patient showed new ill defined enlargement of the pancreatic head with surrounding ill-defined fluid multiple peripancreatic lymph nodes. The admitting M.D. spoke with patient's patient's primary gastroenterologist [facility]-he last saw the patient in 2012.   Subjective / 24 H Interval events - patient with worsening abdominal pain and nausea today   Assessment/Plan: Principal Problem:   Acute pancreatitis Active Problems:   Chronic abdominal pain  Acute pancreatitis - lipase was normal on presentation,  - She tells me that she was suspected for primary sclerosing cholangitis, however this was not confirmed on blood work.  - For now continue IV fluids - tried some clear liquids but nausea worse, back to NPO this morning  Elevated liver enzymes  - LFTs normal on admission, alkaline phosphatase, AST and ALT with significant elevation on 12/19 and 12/20, improving on 12/21, however patient with worsening symptoms today, appreciate GI involvement.  HLD - will need to start on statin once no longer NPO and LFTs better  Migraine HA - continue Fioricet  Diet: Diet clear liquid Fluids:  Normal saline DVT Prophylaxis:  Lovenox  Code Status: Full Code Family Communication:  Discussed with her husband at bedside  Disposition Plan:  Remaining patient  Consultants:   Gastroenterology, Dr. Dulce Sellarutlaw, Dr. Bosie ClosSchooler  Procedures:   None   Antibiotics levofloxacin 12/20 >> Metronidazole 12/20 >>   Studies  1. Ct Abdomen Pelvis W Contrast 11/24/2014 1. Interval  exchange of a plastic biliary stent for a metallic wall stent (since 2012). The stent is patent manifesting as persistent intrahepatic pneumobilia. There is mildly increased biliary dilatation. Recent LFTs remain normal. 2. New ill-defined enlargement of the pancreatic head with surrounding ill-defined fluid and multiple peripancreatic lymph nodes. These findings are suspicious for pancreatitis. Neoplasm cannot be excluded, although is less likely given the chronicity of the biliary stent. Correlate clinically. 3. Mild distal esophageal wall thickening as can be seen with chronic reflux.   2. Dg Abd Acute W/chest 11/24/2014 No evidence of bowel obstruction or free air.  No acute findings.   Objective  Filed Vitals:   11/26/14 0854 11/26/14 1434 11/26/14 2134 11/27/14 0517  BP:  114/61 115/71 131/63  Pulse:      Temp: 99 F (37.2 C) 98.5 F (36.9 C) 97.7 F (36.5 C) 98.3 F (36.8 C)  TempSrc: Oral Oral Oral Oral  Resp:  20 16 15   Height:      Weight:      SpO2:  99% 99% 96%    Intake/Output Summary (Last 24 hours) at 11/27/14 0850 Last data filed at 11/27/14 [id number]  Gross per 24 hour  Intake 3664.58 ml  Output   3800 ml  Net -135.42 ml   Filed Weights   11/24/14 1817  Weight: 69.854 kg (154 lb)   Exam:  General:  Pleasant caucasian F in NAD  Cardiovascular: RRR  Respiratory: clear, no wheezing, no crackles  Abdomen: soft, tender to palpation in the epigastric area mostly   MSK: no peripheral edema  Neuro: no focal findings  Data Reviewed: Basic Metabolic Panel:  Recent Labs Lab 11/24/14 1005 11/25/14 0422  11/26/14 0437 11/27/14 0611  NA 140 141 137 139  K 4.0 4.0 4.5 3.7  CL 102 105 103 104  CO2 27 25 15* 23  GLUCOSE 101* 85 59* 82  BUN 12 7 9  4*  CREATININE 0.57 0.53 0.48* 0.48*  CALCIUM 9.1 8.7 8.7 8.7   Liver Function Tests:  Recent Labs Lab 11/24/14 1005 11/25/14 0422 11/26/14 0437 11/27/14 0611  AST 34 188* 243* 114*  ALT 26 116* 206* 148*    ALKPHOS 113 185* 342* 325*  BILITOT 0.3 0.9 1.0 0.5  PROT 6.9 5.8* 6.2 6.1  ALBUMIN 3.6 2.8* 2.8* 2.7*    Recent Labs Lab 11/24/14 1005 11/25/14 0422  LIPASE 14 19   CBC:  Recent Labs Lab 11/24/14 1005 11/25/14 0422 11/26/14 0437 11/27/14 0611  WBC 7.3 5.1 8.8 4.9  NEUTROABS 5.2  --   --   --   HGB 11.5* 10.7* 11.4* 10.9*  HCT 34.5* 32.1* 34.8* 32.8*  MCV 83.7 86.1 84.5 83.0  PLT 205 185 225 203   Scheduled Meds: . antiseptic oral rinse  7 mL Mouth Rinse BID  . clonazePAM  1 mg Oral QHS  . enoxaparin (LOVENOX) injection  40 mg Subcutaneous Q24H  . HYDROmorphone HCl  32 mg Oral Q24H  . levofloxacin (LEVAQUIN) IV  750 mg Intravenous Q24H  . metronidazole  500 mg Intravenous Q8H  . pantoprazole (PROTONIX) IV  40 mg Intravenous Q24H  . polyethylene glycol  17 g Oral Daily  . senna  2 tablet Oral QHS   Continuous Infusions: . sodium chloride 125 mL/hr (11/27/14 0157)    Time spent: 15 minutes  Pamella Pertostin Kourtney Terriquez, MD [facility] Pager [phone]. If 7 PM - 7 AM, please contact night-coverage at [email or web address], password [email or web address] 11/27/2014, 8:50 AM  LOS: 3 days

## 2014-11-27 NOTE — Progress Notes (Signed)
Patient ID: Ann [name], female   DOB: 03/13/1966, 48 y.o.   MRN: [id number] [facility]  Ann [name] 48 y.o. 03/13/1966   Subjective: Lying in bed complaining of abdominal pain. Recently vomited small amount of bilious vomitus. Reports BM but appearance not known. Feels nauseous.  Objective: Vital signs in last 24 hours: Filed Vitals:   11/27/14 0517  BP: 131/63  Pulse: 82  Temp: 98.3 F (36.8 C)  Resp: 15    Physical Exam: Gen: uncomfortable, +mild acute distress, lethargic Abd: tender to light touch diffusely, nondistended, +BS  Lab Results:  Recent Labs  11/26/14 0437 11/27/14 0611  NA 137 139  K 4.5 3.7  CL 103 104  CO2 15* 23  GLUCOSE 59* 82  BUN 9 4*  CREATININE 0.48* 0.48*  CALCIUM 8.7 8.7    Recent Labs  11/26/14 0437 11/27/14 0611  AST 243* 114*  ALT 206* 148*  ALKPHOS 342* 325*  BILITOT 1.0 0.5  PROT 6.2 6.1  ALBUMIN 2.8* 2.7*    Recent Labs  11/24/14 1005  11/26/14 0437 11/27/14 0611  WBC 7.3  < > 8.8 4.9  NEUTROABS 5.2  --   --   --   HGB 11.5*  < > 11.4* 10.9*  HCT 34.5*  < > 34.8* 32.8*  MCV 83.7  < > 84.5 83.0  PLT 205  < > 225 203  < > = values in this interval not displayed. No results for input(s): LABPROT, INR in the last 72 hours.    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 LFTs improving. WBC normal. Continue antiemetics, pain control, broad spectrum Abx. Hold off on ERCP for now with improvement in LFTs. Keep NPO. Will follow.   Twala Collings C. 11/27/2014, 9:35 AM

## 2014-11-28 LAB — BASIC METABOLIC PANEL
Anion gap: 13 (ref 5–15)
CO2: 20 mmol/L (ref 19–32)
Calcium: 8.3 mg/dL — ABNORMAL LOW (ref 8.4–10.5)
Chloride: 108 mEq/L (ref 96–112)
Creatinine, Ser: 0.56 mg/dL (ref 0.50–1.10)
GFR calc Af Amer: >90 mL/min (ref >90)
GFR calc non Af Amer: >90 mL/min (ref >90)
Glucose, Bld: 80 mg/dL (ref 70–99)
Potassium: 3.3 mmol/L — ABNORMAL LOW (ref 3.5–5.1)
Sodium: 141 mmol/L (ref 135–145)

## 2014-11-28 LAB — CBC
HCT: 33.8 % — ABNORMAL LOW (ref 36.0–46.0)
Hemoglobin: 11.4 g/dL — ABNORMAL LOW (ref 12.0–15.0)
MCH: 27.9 pg (ref 26.0–34.0)
MCHC: 33.7 g/dL (ref 30.0–36.0)
MCV: 82.6 fL (ref 78.0–100.0)
Platelets: 205 10*3/uL (ref 150–400)
RBC: 4.09 MIL/uL (ref 3.87–5.11)
RDW: 12.7 % (ref 11.5–15.5)
WBC: 4.5 10*3/uL (ref 4.0–10.5)

## 2014-11-28 LAB — HEPATIC FUNCTION PANEL
ALBUMIN: 2.7 g/dL — AB (ref 3.5–5.2)
ALT: 99 U/L — ABNORMAL HIGH (ref 0–35)
AST: 52 U/L — AB (ref 0–37)
Alkaline Phosphatase: 255 U/L — ABNORMAL HIGH (ref 39–117)
Bilirubin, Direct: 0.2 mg/dL (ref 0.0–0.3)
Indirect Bilirubin: 0.4 mg/dL (ref 0.3–0.9)
Total Bilirubin: 0.6 mg/dL (ref 0.3–1.2)
Total Protein: 5.2 g/dL — ABNORMAL LOW (ref 6.0–8.3)

## 2014-11-28 MED ORDER — POTASSIUM CHLORIDE CRYS ER 20 MEQ PO TBCR
40.0000 meq | EXTENDED_RELEASE_TABLET | Freq: Once | ORAL | Status: AC
  Administered 2014-11-28: 40 meq via ORAL

## 2014-11-28 MED ORDER — PROCHLORPERAZINE EDISYLATE 5 MG/ML IJ SOLN
10.0000 mg | Freq: Four times a day (QID) | INTRAMUSCULAR | Status: DC | PRN
  Administered 2014-11-28 – 2014-11-30 (×4): 10 mg via INTRAVENOUS
  Filled 2014-11-28 (×9): qty 2

## 2014-11-28 NOTE — Progress Notes (Signed)
Patient ID: Ann [name], female   DOB: 07-18-1966, 48 y.o.   MRN: [id number] [facility]  Ann Earthlyeresa B Harned 48 y.o. 07-18-1966   Subjective: Sleeping comfortably. Reports sleeping last night for the first time. Denies nausea. No further vomiting. Denies abdominal pain.  Objective: Vital signs in last 24 hours: Filed Vitals:   11/28/14 0533  BP: 139/69  Pulse: 71  Temp: 98.3 F (36.8 C)  Resp: 13    Physical Exam: Gen: lethargic, no acute distress, well-nourished Abd: diffusely tender with guarding (less tender than yesterday), soft, ND, +BS  Lab Results:  Recent Labs  11/27/14 0611 11/28/14 0630  NA 139 141  K 3.7 3.3*  CL 104 108  CO2 23 20  GLUCOSE 82 80  BUN 4* <5*  CREATININE 0.48* 0.56  CALCIUM 8.7 8.3*    Recent Labs  11/27/14 0611 11/28/14 0630  AST 114* 52*  ALT 148* 99*  ALKPHOS 325* 255*  BILITOT 0.5 0.6  PROT 6.1 5.2*  ALBUMIN 2.7* 2.7*    Recent Labs  11/27/14 0611 11/28/14 0630  WBC 4.9 4.5  HGB 10.9* 11.4*  HCT 32.8* 33.8*  MCV 83.0 82.6  PLT 203 205   No results for input(s): LABPROT, INR in the last 72 hours.    Assessment/Plan: 48 yo with suspected PSC and recent fevers and elevated LFTs. Transaminases are normalizing. ALP improving. D/W Dr. Elvera LennoxGherghe. Agree with starting clear liquids today. Continue supportive care. Probably needs a repeat CT in 6-8 weeks to reevaluate pancreas. Patient currently has no true desire to go home when she is better stating that it doesn't matter to her. Nurse present during my evaluation.    Dianara Smullen C. 11/28/2014, 9:08 AM

## 2014-11-28 NOTE — Progress Notes (Addendum)
PROGRESS NOTE  Ann [name] YNW:[id number] DOB: Apr 23, 1966 DOA: 11/24/2014 PCP: No primary care provider on file.  HPI: 48 y.o. 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 CT scan of the patient showed new ill defined enlargement of the pancreatic head with surrounding ill-defined fluid multiple peripancreatic lymph nodes. The admitting M.D. spoke with patient's patient's primary gastroenterologist Dr[facility]John [facility]ween[facility] at [facility]-he last saw the patient in 2012.   Interim course: Patient with normal LFTs on admission, however started to rise on 12/19 and 12/20 and patient became febrile. She was started on antibiotics and GI discussed about plans for an ERCP. Her LFT started trending down 12/21 and 12/22 and the ERCP was cancelled. Patient somewhat improving, now afebrile. She failed diet advance twice, most recent 12/22 could not tolerate liquids and is back NPO.   Subjective / 24 H Interval events - she is feeling better this morning however more nauseous after eating clears   Assessment/Plan: Principal Problem:   Acute pancreatitis Active Problems:   Chronic abdominal pain   Acute pancreatitis - lipase was normal on presentation,  - She tells me that she was suspected for primary sclerosing cholangitis, however this was not confirmed on blood work.  - she is feeling better, her diet changed to clear liquids this morning however she had worsening nausea and vomiting.  - will change back to NPO - appreciate GI assistance.  Elevated liver enzymes  - LFTs normal on admission, alkaline phosphatase, AST and ALT with significant elevation on 12/19 and 12/20, improving on 12/21 and 12/22 - continue antibiotics given fevers 12/29  HLD - will need to start on statin once no  longer NPO and LFTs better  Migraine HA - continue Fioricet   Diet: Diet NPO time specified Fluids:  Normal saline DVT Prophylaxis:  Lovenox  Code Status: Full Code Family Communication:  Discussed with her husband at bedside  Disposition Plan:  Remaining patient  Consultants:   Gastroenterology, Dr. Dulce Sellarutlaw, Dr. Bosie ClosSchooler  Procedures:   None   Antibiotics levofloxacin 12/20 >> Metronidazole 12/20 >>   Studies  1. Ct Abdomen Pelvis W Contrast 11/24/2014 1. Interval exchange of a plastic biliary stent for a metallic wall stent (since 2012). The stent is patent manifesting as persistent intrahepatic pneumobilia. There is mildly increased biliary dilatation. Recent LFTs remain normal. 2. New ill-defined enlargement of the pancreatic head with surrounding ill-defined fluid and multiple peripancreatic lymph nodes. These findings are suspicious for pancreatitis. Neoplasm cannot be excluded, although is less likely given the chronicity of the biliary stent. Correlate clinically. 3. Mild distal esophageal wall thickening as can be seen with chronic reflux.   2. Dg Abd Acute W/chest 11/24/2014 No evidence of bowel obstruction or free air.  No acute findings.   Objective  Filed Vitals:   11/27/14 0517 11/27/14 1401 11/27/14 2153 11/28/14 0533  BP: 131/63 139/74 127/65 139/69  Pulse:  72  71  Temp: 98.3 F (36.8 C)  98.4 F (36.9 C) 98.3 F (36.8 C)  TempSrc: Oral  Oral Oral  Resp: 15 18 14 13   Height:      Weight:      SpO2: 96% 96% 97% 98%    Intake/Output Summary (Last 24 hours) at 11/28/14 1248 Last data filed at 11/28/14 1043  Gross per 24 hour  Intake      0 ml  Output   1925 ml  Net  -1925 ml   Filed Weights   11/24/14 1817  Weight: 69.854 kg (154 lb)   Exam:  General:  Pleasant caucasian F in NAD  Cardiovascular: RRR  Respiratory: clear, no wheezing, no crackles  Abdomen: soft, tender in the epigastric area  MSK: no peripheral edema  Neuro: no focal  findings  Data Reviewed: Basic Metabolic Panel:  Recent Labs Lab 11/24/14 1005 11/25/14 0422 11/26/14 0437 11/27/14 0611 11/28/14 0630  NA 140 141 137 139 141  K 4.0 4.0 4.5 3.7 3.3*  CL 102 105 103 104 108  CO2 27 25 15* 23 20  GLUCOSE 101* 85 59* 82 80  BUN 12 7 9  4* <5*  CREATININE 0.57 0.53 0.48* 0.48* 0.56  CALCIUM 9.1 8.7 8.7 8.7 8.3*   Liver Function Tests:  Recent Labs Lab 11/24/14 1005 11/25/14 0422 11/26/14 0437 11/27/14 0611 11/28/14 0630  AST 34 188* 243* 114* 52*  ALT 26 116* 206* 148* 99*  ALKPHOS 113 185* 342* 325* 255*  BILITOT 0.3 0.9 1.0 0.5 0.6  PROT 6.9 5.8* 6.2 6.1 5.2*  ALBUMIN 3.6 2.8* 2.8* 2.7* 2.7*    Recent Labs Lab 11/24/14 1005 11/25/14 0422  LIPASE 14 19   CBC:  Recent Labs Lab 11/24/14 1005 11/25/14 0422 11/26/14 0437 11/27/14 0611 11/28/14 0630  WBC 7.3 5.1 8.8 4.9 4.5  NEUTROABS 5.2  --   --   --   --   HGB 11.5* 10.7* 11.4* 10.9* 11.4*  HCT 34.5* 32.1* 34.8* 32.8* 33.8*  MCV 83.7 86.1 84.5 83.0 82.6  PLT 205 185 225 203 205   Scheduled Meds: . antiseptic oral rinse  7 mL Mouth Rinse BID  . clonazePAM  1 mg Oral QHS  . enoxaparin (LOVENOX) injection  40 mg Subcutaneous Q24H  . HYDROmorphone HCl  32 mg Oral Q24H  . levofloxacin (LEVAQUIN) IV  750 mg Intravenous Q24H  . metronidazole  500 mg Intravenous Q8H  . pantoprazole (PROTONIX) IV  40 mg Intravenous Q24H  . polyethylene glycol  17 g Oral Daily  . potassium chloride  40 mEq Oral Once  . senna  2 tablet Oral QHS   Continuous Infusions: . sodium chloride 125 mL/hr at 11/27/14 2139    Time spent: 15 minutes  Pamella Pertostin Gherghe, MD [facility] Pager [phone]. If 7 PM - 7 AM, please contact night-coverage at [email or web address], password [email or web address] 11/28/2014, 12:48 PM  LOS: 4 days

## 2014-11-29 LAB — HEPATIC FUNCTION PANEL
ALT: 88 U/L — ABNORMAL HIGH (ref 0–35)
AST: 60 U/L — ABNORMAL HIGH (ref 0–37)
Albumin: 3.1 g/dL — ABNORMAL LOW (ref 3.5–5.2)
Alkaline Phosphatase: 238 U/L — ABNORMAL HIGH (ref 39–117)
Bilirubin, Direct: 0.1 mg/dL (ref 0.0–0.3)
Indirect Bilirubin: 0.5 mg/dL (ref 0.3–0.9)
Total Bilirubin: 0.6 mg/dL (ref 0.3–1.2)
Total Protein: 5.8 g/dL — ABNORMAL LOW (ref 6.0–8.3)

## 2014-11-29 MED ORDER — POTASSIUM CHLORIDE CRYS ER 20 MEQ PO TBCR: 40.0000 meq | EXTENDED_RELEASE_TABLET | Freq: Once | ORAL | Status: DC

## 2014-11-29 NOTE — Progress Notes (Signed)
Ann [name] [id number] DOB: 06/20/1966 DOA: 11/24/2014   PCP: No primary care provider on file.  HPI: 48 y.o. 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 CT scan of the patient showed new ill defined enlargement of the pancreatic head with surrounding ill-defined fluid multiple peripancreatic lymph nodes. The admitting M.D. spoke with patient's patient's primary gastroenterologist Dr[name] at [facility]-he last saw the patient in 2012.   Assessment/Plan:  Principal problem Acute pancreatitis - lipase was normal on presentation, suspected for primary sclerosing cholangitis, however this was not confirmed on blood work.  - Continue IV fluids, pain control, patient wants to try a clear liquid diet today - Currently on broad-spectrum antibiotics  Active problems Transaminitis with fever - LFTs normal on admission, alkaline phosphatase, AST and ALT with significant elevation on 12/19 and 12/20, now improving - Gastroenterology consulted, recommended repeat CT scan in 6-8 weeks to reevaluate pancreas - Continue supportive care, broad-spectrum antibiotics  Hyperlipidemia - Hold statins due to transaminitis  Migraine HA  - continue Fioricet  DVT prophylaxis: Lovenox  CODE STATUS: Full code  Disposition: Once at least tolerating full liquid diets or soft solids  Consultants:   Gastroenterology: Dr. Dulce [name], Dr. Bosie [name]  Procedures:   None   Antibiotics levofloxacin 12/20 >> Metronidazole 12/20 >>  Radiological Studies  1. Ct Abdomen Pelvis W Contrast 11/24/2014 1. Interval exchange of a plastic biliary stent for a metallic wall stent (since 2012). The stent is patent manifesting as persistent intrahepatic pneumobilia. There is mildly increased  biliary dilatation. Recent LFTs remain normal. 2. New ill-defined enlargement of the pancreatic head with surrounding ill-defined fluid and multiple peripancreatic lymph nodes. These findings are suspicious for pancreatitis. Neoplasm cannot be excluded, although is less likely given the chronicity of the biliary stent. Correlate clinically. 3. Mild distal esophageal wall thickening as can be seen with chronic reflux.   2. Dg Abd Acute W/chest 11/24/2014 No evidence of bowel obstruction or free air.  No acute findings.   Subjective Patient seen and examined, states abdominal pain is slightly improving, wants to try clear liquids today, no fevers or chills  Objective  Filed Vitals:   11/28/14 0533 11/28/14 1342 11/28/14 2125 11/29/14 0625  BP: 139/69 137/73 113/68 130/72  Pulse: 71 80 79 78  Temp: 98.3 F (36.8 C) 98.7 F (37.1 C) 98.4 F (36.9 C) 98.3 F (36.8 C)  TempSrc: Oral Oral Oral Oral  Resp: 13 20 18 16   Height:      Weight:      SpO2: 98% 97% 98% 98%    Intake/Output Summary (Last 24 hours) at 11/29/14 1232 Last data filed at 11/29/14 1036  Gross per 24 hour  Intake      0 ml  Output   2300 ml  Net  -2300 ml   Filed Weights   11/24/14 1817  Weight: 69.854 kg (154 lb)   Physical Exam  Gen.: Patient is alert and awake and oriented, NAD CVS: S1 and S2 clear, no MRG Chest: Clear to auscultation bilaterally, no wheezing or rhonchi Abdomen: Mild tenderness to deep palpation in the epigastric region, soft nondistended Extremities: No cyanosis clubbing or edema noted   Data Reviewed: Basic Metabolic Panel:  Recent Labs Lab 11/24/14 1005 11/25/14 0422 11/26/14 0437 11/27/14 0611 11/28/14 0630  NA 140 141  137 139 141  K 4.0 4.0 4.5 3.7 3.3*  CL 102 105 103 104 108  CO2 27 25 15* 23 20  GLUCOSE 101* 85 59* 82 80  BUN 12 7 9  4* <5*  CREATININE 0.57 0.53 0.48* 0.48* 0.56  CALCIUM 9.1 8.7 8.7 8.7 8.3*   Liver Function Tests:  Recent Labs Lab 11/24/14 1005  11/25/14 0422 11/26/14 0437 11/27/14 0611 11/28/14 0630  AST 34 188* 243* 114* 52*  ALT 26 116* 206* 148* 99*  ALKPHOS 113 185* 342* 325* 255*  BILITOT 0.3 0.9 1.0 0.5 0.6  PROT 6.9 5.8* 6.2 6.1 5.2*  ALBUMIN 3.6 2.8* 2.8* 2.7* 2.7*    Recent Labs Lab 11/24/14 1005 11/25/14 0422  LIPASE 14 19   CBC:  Recent Labs Lab 11/24/14 1005 11/25/14 0422 11/26/14 0437 11/27/14 0611 11/28/14 0630  WBC 7.3 5.1 8.8 4.9 4.5  NEUTROABS 5.2  --   --   --   --   HGB 11.5* 10.7* 11.4* 10.9* 11.4*  HCT 34.5* 32.1* 34.8* 32.8* 33.8*  MCV 83.7 86.1 84.5 83.0 82.6  PLT 205 185 225 203 205   Scheduled Meds: . antiseptic oral rinse  7 mL Mouth Rinse BID  . clonazePAM  1 mg Oral QHS  . enoxaparin (LOVENOX) injection  40 mg Subcutaneous Q24H  . HYDROmorphone HCl  32 mg Oral Q24H  . levofloxacin (LEVAQUIN) IV  750 mg Intravenous Q24H  . metronidazole  500 mg Intravenous Q8H  . pantoprazole (PROTONIX) IV  40 mg Intravenous Q24H  . polyethylene glycol  17 g Oral Daily  . senna  2 tablet Oral QHS   Continuous Infusions: . sodium chloride 125 mL/hr at 11/29/14 0402     Rafel Garde M.D. [facility] 11/29/2014, 12:39 PM  Pager: [phone]

## 2014-11-29 NOTE — Progress Notes (Signed)
Patient ID: Ann [name], female   DOB: Dec 05, 1966, 48 y.o.   MRN: [id number] [facility]  Ann Earthlyeresa B Klima 48 y.o. Dec 05, 1966   Subjective: Reports abdominal pain a little better today. Vomited after clears yesterday but denies N/V and wants to drink liquids again. Husband at bedside.  Objective: Vital signs in last 24 hours: Filed Vitals:   11/29/14 0625  BP: 130/72  Pulse: 78  Temp: 98.3 F (36.8 C)  Resp: 16    Physical Exam: Gen: lethargic, no acute distress Abd: less tender in epigastric with guarding, soft, nondistended, +BS  Lab Results:  Recent Labs  11/27/14 0611 11/28/14 0630  NA 139 141  K 3.7 3.3*  CL 104 108  CO2 23 20  GLUCOSE 82 80  BUN 4* <5*  CREATININE 0.48* 0.56  CALCIUM 8.7 8.3*    Recent Labs  11/27/14 0611 11/28/14 0630  AST 114* 52*  ALT 148* 99*  ALKPHOS 325* 255*  BILITOT 0.5 0.6  PROT 6.1 5.2*  ALBUMIN 2.7* 2.7*    Recent Labs  11/27/14 0611 11/28/14 0630  WBC 4.9 4.5  HGB 10.9* 11.4*  HCT 32.8* 33.8*  MCV 83.0 82.6  PLT 203 205   No results for input(s): LABPROT, INR in the last 72 hours.    Assessment/Plan: Acute pancreatitis in the setting of possible PSC. Will recheck LFTs today. Retry clears. Supportive care. Nurse present during my evaluation. Defer short term medical leave question to primary team. Will follow.   Jeret Goyer C. 11/29/2014, 12:36 PM

## 2014-11-30 LAB — CBC
HEMATOCRIT: 33.3 % — AB (ref 36.0–46.0)
Hemoglobin: 11.3 g/dL — ABNORMAL LOW (ref 12.0–15.0)
MCH: 27.8 pg (ref 26.0–34.0)
MCHC: 33.9 g/dL (ref 30.0–36.0)
MCV: 81.8 fL (ref 78.0–100.0)
PLATELETS: 220 10*3/uL (ref 150–400)
RBC: 4.07 MIL/uL (ref 3.87–5.11)
RDW: 12.7 % (ref 11.5–15.5)
WBC: 4.6 10*3/uL (ref 4.0–10.5)

## 2014-11-30 LAB — COMPREHENSIVE METABOLIC PANEL
ALT: 68 U/L — ABNORMAL HIGH (ref 0–35)
AST: 56 U/L — ABNORMAL HIGH (ref 0–37)
Albumin: 2.8 g/dL — ABNORMAL LOW (ref 3.5–5.2)
Alkaline Phosphatase: 190 U/L — ABNORMAL HIGH (ref 39–117)
Anion gap: 10 (ref 5–15)
BUN: <5 mg/dL — ABNORMAL LOW (ref 6–23)
CALCIUM: 8.5 mg/dL (ref 8.4–10.5)
CO2: 21 mmol/L (ref 19–32)
CREATININE: 0.63 mg/dL (ref 0.50–1.10)
Chloride: 109 mEq/L (ref 96–112)
GFR calc Af Amer: >90 mL/min (ref >90)
Glucose, Bld: 85 mg/dL (ref 70–99)
Potassium: 3.7 mmol/L (ref 3.5–5.1)
Sodium: 140 mmol/L (ref 135–145)
Total Bilirubin: 0.5 mg/dL (ref 0.3–1.2)
Total Protein: 5.4 g/dL — ABNORMAL LOW (ref 6.0–8.3)

## 2014-11-30 LAB — LIPASE, BLOOD: LIPASE: 25 U/L (ref 11–59)

## 2014-11-30 NOTE — Progress Notes (Signed)
Ann [name] ION:[id number] DOB: May 17, 1966 DOA: 11/24/2014   PCP: No primary care provider on file.  HPI: 48 y.o. 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 CT scan of the patient showed new ill defined enlargement of the pancreatic head with surrounding ill-defined fluid multiple peripancreatic lymph nodes. The admitting M.D. spoke with patient's patient's primary gastroenterologist Dr. Carla DrapeJohn Sweeney at [facility]-he last saw the patient in 2012.   Assessment/Plan:  Principal problem Acute pancreatitis: Per patient, pain is improving - lipase was normal on presentation, suspected for primary sclerosing cholangitis, however this was not confirmed on blood work.  - Continue IV fluids, pain control, advance diet to full liquids today  - Currently on broad-spectrum antibiotics  Active problems Transaminitis with fever - LFTs normal on admission, alkaline phosphatase, AST and ALT with significant elevation on 12/19 and 12/20, now improving - Gastroenterology consulted, recommended repeat CT scan in 6-8 weeks to reevaluate pancreas - Continue supportive care, broad-spectrum antibiotics  Hyperlipidemia - Hold statins due to transaminitis  Migraine HA  - continue Fioricet  DVT prophylaxis: Lovenox  CODE STATUS: Full code  Disposition: Hopefully DC home in a.m.  Consultants:   Gastroenterology: Dr. Dulce Sellarutlaw, Dr. Bosie ClosSchooler  Procedures:   None   Antibiotics levofloxacin 12/20 >> Metronidazole 12/20 >>  Radiological Studies  1. Ct Abdomen Pelvis W Contrast 11/24/2014 1. Interval exchange of a plastic biliary stent for a metallic wall stent (since 2012). The stent is patent manifesting as persistent intrahepatic pneumobilia. There is mildly increased biliary  dilatation. Recent LFTs remain normal. 2. New ill-defined enlargement of the pancreatic head with surrounding ill-defined fluid and multiple peripancreatic lymph nodes. These findings are suspicious for pancreatitis. Neoplasm cannot be excluded, although is less likely given the chronicity of the biliary stent. Correlate clinically. 3. Mild distal esophageal wall thickening as can be seen with chronic reflux.   2. Dg Abd Acute W/chest 11/24/2014 No evidence of bowel obstruction or free air.  No acute findings.   Subjective Patient seen and examined, ambulating in the room at the time of my encounter however still asking for IV pain medications around the clock   Objective  Filed Vitals:   11/29/14 0625 11/29/14 1419 11/29/14 2122 11/30/14 0547  BP: 130/72 110/67 141/81 136/68  Pulse: 78 82 65 69  Temp: 98.3 F (36.8 C) 98.1 F (36.7 C) 98.4 F (36.9 C) 98.3 F (36.8 C)  TempSrc: Oral Oral Oral Oral  Resp: 16 16 15 16   Height:      Weight:      SpO2: 98% 98% 98% 97%    Intake/Output Summary (Last 24 hours) at 11/30/14 1250 Last data filed at 11/30/14 1139  Gross per 24 hour  Intake 11032.5 ml  Output   2650 ml  Net 8382.5 ml   Filed Weights   11/24/14 1817  Weight: 69.854 kg (154 lb)   Physical Exam  Gen.: Patient is alert and awake and oriented, NAD CVS: S1 and S2 clear, no MRG Chest: Clear to auscultation bilaterally Abdomen: Mild tenderness to deep palpation in the epigastric region, soft nondistended Extremities: No cyanosis clubbing or edema noted   Data Reviewed: Basic Metabolic Panel:  Recent Labs Lab 11/25/14 0422 11/26/14 0437 11/27/14 0611 11/28/14 0630 11/30/14 0600  NA 141 137 139 141 140  K 4.0 4.5  3.7 3.3* 3.7  CL 105 103 104 108 109  CO2 25 15* 23 20 21   GLUCOSE 85 59* 82 80 85  BUN 7 9 4* <5* <5*  CREATININE 0.53 0.48* 0.48* 0.56 0.63  CALCIUM 8.7 8.7 8.7 8.3* 8.5   Liver Function Tests:  Recent Labs Lab 11/26/14 0437 11/27/14 0611  11/28/14 0630 11/29/14 1250 11/30/14 0600  AST 243* 114* 52* 60* 56*  ALT 206* 148* 99* 88* 68*  ALKPHOS 342* 325* 255* 238* 190*  BILITOT 1.0 0.5 0.6 0.6 0.5  PROT 6.2 6.1 5.2* 5.8* 5.4*  ALBUMIN 2.8* 2.7* 2.7* 3.1* 2.8*    Recent Labs Lab 11/24/14 1005 11/25/14 0422 11/30/14 0600  LIPASE 14 19 25    CBC:  Recent Labs Lab 11/24/14 1005 11/25/14 0422 11/26/14 0437 11/27/14 0611 11/28/14 0630 11/30/14 0600  WBC 7.3 5.1 8.8 4.9 4.5 4.6  NEUTROABS 5.2  --   --   --   --   --   HGB 11.5* 10.7* 11.4* 10.9* 11.4* 11.3*  HCT 34.5* 32.1* 34.8* 32.8* 33.8* 33.3*  MCV 83.7 86.1 84.5 83.0 82.6 81.8  PLT 205 185 225 203 205 220   Scheduled Meds: . antiseptic oral rinse  7 mL Mouth Rinse BID  . clonazePAM  1 mg Oral QHS  . enoxaparin (LOVENOX) injection  40 mg Subcutaneous Q24H  . HYDROmorphone HCl  32 mg Oral Q24H  . levofloxacin (LEVAQUIN) IV  750 mg Intravenous Q24H  . metronidazole  500 mg Intravenous Q8H  . pantoprazole (PROTONIX) IV  40 mg Intravenous Q24H  . polyethylene glycol  17 g Oral Daily  . senna  2 tablet Oral QHS   Continuous Infusions: . sodium chloride 1,000 mL (11/30/14 1136)     RAI,RIPUDEEP M.D. [facility] 11/30/2014, 12:50 PM  Pager: [phone]

## 2014-11-30 NOTE — Progress Notes (Signed)
Patient ID: Ann [name], female   DOB: Dec 30, 1965, 48 y.o.   MRN: [id number] [facility] Progress Note  Ann [name] 48 y.o. Dec 30, 1965   Subjective: Resting comfortably. Nausea this morning. Denies vomiting. Denies abdominal pain right now. Has been ambulating. Tolerating clears without vomiting yesterday.  Objective: Vital signs in last 24 hours: Filed Vitals:   11/30/14 1259  BP: 154/78  Pulse: 62  Temp: 98.4 F (36.9 C)  Resp: 18    Physical Exam: Gen: awake, no acute distress Abd: upper quadrant tenderness with guarding, soft, nondistended, +BS  Lab Results:  Recent Labs  11/28/14 0630 11/30/14 0600  NA 141 140  K 3.3* 3.7  CL 108 109  CO2 20 21  GLUCOSE 80 85  BUN <5* <5*  CREATININE 0.56 0.63  CALCIUM 8.3* 8.5    Recent Labs  11/29/14 1250 11/30/14 0600  AST 60* 56*  ALT 88* 68*  ALKPHOS 238* 190*  BILITOT 0.6 0.5  PROT 5.8* 5.4*  ALBUMIN 3.1* 2.8*    Recent Labs  11/28/14 0630 11/30/14 0600  WBC 4.5 4.6  HGB 11.4* 11.3*  HCT 33.8* 33.3*  MCV 82.6 81.8  PLT 205 220   No results for input(s): LABPROT, INR in the last 72 hours.    Assessment/Plan: Resolving acute pancreatitis - LFTs normalizing. Supportive care. Agree with full liquids and would advance tomorrow to low fat diet if doing ok. Will sign off. Call if questions. F/U as outpt with Dr. Dulce Sellarutlaw in January.   Efrem Pitstick C. 11/30/2014, 3:54 PM

## 2014-12-01 LAB — COMPREHENSIVE METABOLIC PANEL
ALBUMIN: 3 g/dL — AB (ref 3.5–5.2)
ALK PHOS: 173 U/L — AB (ref 39–117)
ALT: 58 U/L — ABNORMAL HIGH (ref 0–35)
AST: 48 U/L — AB (ref 0–37)
Anion gap: 10 (ref 5–15)
CHLORIDE: 108 meq/L (ref 96–112)
CO2: 24 mmol/L (ref 19–32)
Calcium: 8.5 mg/dL (ref 8.4–10.5)
Creatinine, Ser: 0.57 mg/dL (ref 0.50–1.10)
GFR calc Af Amer: >90 mL/min (ref >90)
GFR calc non Af Amer: >90 mL/min (ref >90)
Glucose, Bld: 92 mg/dL (ref 70–99)
Potassium: 3.2 mmol/L — ABNORMAL LOW (ref 3.5–5.1)
SODIUM: 142 mmol/L (ref 135–145)
Total Bilirubin: 0.5 mg/dL (ref 0.3–1.2)
Total Protein: 5.3 g/dL — ABNORMAL LOW (ref 6.0–8.3)

## 2014-12-01 LAB — LIPASE, BLOOD: Lipase: 25 U/L (ref 11–59)

## 2014-12-01 MED ORDER — PROMETHAZINE HCL 25 MG PO TABS: 25.0000 mg | ORAL_TABLET | Freq: Four times a day (QID) | ORAL | Status: DC | PRN

## 2014-12-01 MED ORDER — CLONAZEPAM 1 MG PO TABS: 1.0000 mg | ORAL_TABLET | Freq: Every day | ORAL | Status: DC

## 2014-12-01 MED ORDER — POLYETHYLENE GLYCOL 3350 17 G PO PACK
17.0000 g | PACK | Freq: Every day | ORAL | Status: DC | PRN
Start: 2014-12-01 — End: 2016-02-12

## 2014-12-01 MED ORDER — GUAIFENESIN-DM 100-10 MG/5ML PO SYRP: 5.0000 mL | ORAL_SOLUTION | ORAL | Status: DC | PRN

## 2014-12-01 MED ORDER — SENNA 8.6 MG PO TABS: 2.0000 | ORAL_TABLET | Freq: Every day | ORAL | Status: DC

## 2014-12-01 NOTE — Discharge Summary (Signed)
Physician Discharge Summary  Patient ID: Ann [name] MRN: [id number] DOB/AGE: [birth year] 48 y.o.  Admit date: 11/24/2014 Discharge date: 12/01/2014  Primary Care Physician:  No primary care provider on file.  Discharge Diagnoses:    . Acute pancreatitis   Transaminitis    Migraine   Consults:  Gastroenterology, Dr. Bosie Clos    Recommendations for Outpatient Follow-up:  Patient is reminded to follow up with gastroenterology, Dr. Dulce Sellar in 2 weeks.    DIET soft    Allergies:   Allergies  Allergen Reactions  . Imitrex [Sumatriptan] Anaphylaxis and Rash  . Doxycycline Diarrhea    Very severe diarrhea   . Lyrica [Pregabalin] Swelling and Other (See Comments)    Swelling of legs and ankles Blurry Vision   . Amoxicillin-Pot Clavulanate Diarrhea and Rash  . Butrans [Buprenorphine] Hives, Swelling and Rash    Swelling of ankles      Discharge Medications:   Medication List    STOP taking these medications        diazepam 5 MG tablet  Commonly known as:  VALIUM      TAKE these medications        ALEVE PO  Take 2 tablets by mouth daily as needed (pain).     butalbital-acetaminophen-caffeine 50-325-40 MG per tablet  Commonly known as:  FIORICET, ESGIC  Take 1-2 tablets by mouth every 6 (six) hours as needed for headache or migraine.     clonazePAM 1 MG tablet  Commonly known as:  KLONOPIN  Take 1 tablet (1 mg total) by mouth at bedtime.     EXALGO 32 MG T24a  Generic drug:  HYDROmorphone HCl  Take 32 mg by mouth daily.     guaiFENesin-dextromethorphan 100-10 MG/5ML syrup  Commonly known as:  ROBITUSSIN DM  Take 5 mLs by mouth every 4 (four) hours as needed for cough.     NUVIGIL 250 MG tablet  Generic drug:  Armodafinil  Take 250 mg by mouth daily.     Oxycodone HCl 20 MG Tabs  Take 1 tablet by mouth 4 (four) times daily.     polyethylene glycol packet  Commonly known as:  MIRALAX / GLYCOLAX  Take 17 g by mouth daily as needed for moderate  constipation.     promethazine 25 MG tablet  Commonly known as:  PHENERGAN  Take 1 tablet (25 mg total) by mouth every 6 (six) hours as needed for nausea or vomiting.     senna 8.6 MG Tabs tablet  Commonly known as:  SENOKOT  Take 2 tablets (17.2 mg total) by mouth at bedtime. For constipation         Brief H and P: For complete details please refer to admission H and P, but in brief48 y.o. 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 CT scan of the patient showed new ill defined enlargement of the pancreatic head with surrounding ill-defined fluid multiple peripancreatic lymph nodes. The admitting M.D. spoke with patient's patient's primary gastroenterologist Dr. Carla Drape at [place] last saw the patient in 2012.  Hospital Course:  Acute pancreatitis:  Resolving, at the time of my examination on the day of discharge patient was eating solid diet without any difficulty. Lipase was normal on presentation, suspected for primary sclerosing cholangitis, however this was not confirmed on blood work. CT  abdomen and pelvis showed mild intrahepatic biliary duct dilatation and pneumobilia, metallic CBD stent, mild focal pancreatitis, no pseudocyst or abscess.  Gastroenterology was consulted and recommended supportive care, IV fluids, pain control, NPO and  then slowly advance diet. Patient was also placed on broad-spectrum antibiotics as she was noticed to have an episode of fever with transaminitis. She has not spiked any fever in the last 48 hours, no leukocytosis, no signs and symptoms of any acute infection. Antibiotics were discontinued at the time of discharge.   Transaminitis with fever - LFTs normal on admission, alkaline phosphatase, AST and ALT with significant elevation  on 12/19 and 12/20, now improving, close to normal. Gastroenterology recommended repeat CT scan in 6-8 weeks to reevaluate the pancreas.   Hyperlipidemia - Lipid panel showed triglycerides of 266, patient was not placed on any statins secondary to transaminitis.  Migraine HA - continue Fioricet   Day of Discharge BP 144/77 mmHg  Pulse 68  Temp(Src) 98.6 F (37 C) (Oral)  Resp 20  Ht 5\' 6"  (1.676 m)  Wt 69.854 kg (154 lb)  BMI 24.87 kg/m2  SpO2 96%  LMP 05/16/2012  Physical Exam: General: Alert and awake oriented x3 not in any acute distress. HEENT: anicteric sclera, pupils reactive to light and accommodation CVS: S1-S2 clear no murmur rubs or gallops Chest: clear to auscultation bilaterally, no wheezing rales or rhonchi Abdomen: soft nontender, nondistended, normal bowel sounds Extremities: no cyanosis, clubbing or edema noted bilaterally Neuro: Cranial nerves II-XII intact, no focal neurological deficits   The results of significant diagnostics from this hospitalization (including imaging, microbiology, ancillary and laboratory) are listed below for reference.    LAB RESULTS: Basic Metabolic Panel:  Recent Labs Lab 11/30/14 0600 12/01/14 0532  NA 140 142  K 3.7 3.2*  CL 109 108  CO2 21 24  GLUCOSE 85 92  BUN <5* <5*  CREATININE 0.63 0.57  CALCIUM 8.5 8.5   Liver Function Tests:  Recent Labs Lab 11/30/14 0600 12/01/14 0532  AST 56* 48*  ALT 68* 58*  ALKPHOS 190* 173*  BILITOT 0.5 0.5  PROT 5.4* 5.3*  ALBUMIN 2.8* 3.0*    Recent Labs Lab 11/30/14 0600 12/01/14 0532  LIPASE 25 25   No results for input(s): AMMONIA in the last 168 hours. CBC:  Recent Labs Lab 11/24/14 1005  11/28/14 0630 11/30/14 0600  WBC 7.3  < > 4.5 4.6  NEUTROABS 5.2  --   --   --   HGB 11.5*  < > 11.4* 11.3*  HCT 34.5*  < > 33.8* 33.3*  MCV 83.7  < > 82.6 81.8  PLT 205  < > 205 220  < > = values in this interval not displayed. Cardiac Enzymes: No results for  input(s): CKTOTAL, CKMB, CKMBINDEX, TROPONINI in the last 168 hours. BNP: Invalid input(s): POCBNP CBG: No results for input(s): GLUCAP in the last 168 hours.  Significant Diagnostic Studies:  Ct Abdomen Pelvis W Contrast  11/24/2014   CLINICAL DATA:  Abdominal pain with acid reflux. History of gallbladder surgery and biliary stenting. Worsening pain over the last 4 days. Initial encounter.  EXAM: CT ABDOMEN AND PELVIS WITH CONTRAST  TECHNIQUE: Multidetector CT imaging of the abdomen and pelvis was performed using the standard protocol following bolus administration of intravenous contrast.  CONTRAST:  100mL OMNIPAQUE IOHEXOL 300 MG/ML  SOLN  COMPARISON:  Abdominal CT 06/13/2011. Acute abdominal series done 11/24/2014.  FINDINGS: Lower chest: There is mild atelectasis at both lung bases.  No confluent airspace opacity or pleural effusion demonstrated. There is mild distal esophageal wall thickening associated with a small hiatal hernia.  Hepatobiliary: The liver is normal in density without focal abnormality. There is mildly progressive intrahepatic biliary dilatation. Pneumobilia remains within the left hepatic lobe. The previously demonstrated plastic biliary stent has been exchanged for a metallic wall stent. There is high-density superiorly within the lumen of the stent. There is some air within the lumen of the stent. There is no reflux of contrast from the duodenum into the stent.  Pancreas: There is new ill-defined enlargement of the pancreatic head without well-defined mass. There is no pancreatic ductal dilatation. There is retroperitoneal edema surrounding the pancreatic head and proximal duodenum. There are adjacent mildly enlarged lymph nodes, measuring up to 11 mm on axial image 30 and 11 mm on coronal image number 45.  Spleen: Stable mild prominence without focal abnormality.  Adrenals/Urinary Tract: Both adrenal glands appear normal.The kidneys appear normal without evidence of urinary tract  calculus or hydronephrosis. No bladder abnormalities are seen.  Stomach/Bowel: No evidence of bowel wall thickening, distention or surrounding inflammatory change.As above, there is ill-defined fluid surrounding the pancreatic head and proximal duodenum. No other extraluminal fluid collections demonstrated.  Vascular/Lymphatic: As above, there are prominent lymph node surrounding the pancreatic head and within the porta hepatis. There is no lower retroperitoneal or pelvic lymphadenopathy. Mild aortoiliac atherosclerosis appears stable.  Reproductive: The uterus and ovaries demonstrate no significant findings.  Other: No evidence of abdominal wall mass or hernia.  Musculoskeletal: No acute or significant osseous findings.  IMPRESSION: 1. Interval exchange of a plastic biliary stent for a metallic wall stent (since 2012). The stent is patent manifesting as persistent intrahepatic pneumobilia. There is mildly increased biliary dilatation. Recent LFTs remain normal. 2. New ill-defined enlargement of the pancreatic head with surrounding ill-defined fluid and multiple peripancreatic lymph nodes. These findings are suspicious for pancreatitis. Neoplasm cannot be excluded, although is less likely given the chronicity of the biliary stent. Correlate clinically. 3. Mild distal esophageal wall thickening as can be seen with chronic reflux.   Electronically Signed   By: Roxy Horseman M.D.   On: 11/24/2014 14:10   Dg Abd Acute W/chest  11/24/2014   CLINICAL DATA:  Sharp mid abdominal pain, left lower quadrant pain for 4 days. Chills.  EXAM: ACUTE ABDOMEN SERIES (ABDOMEN 2 VIEW & CHEST 1 VIEW)  COMPARISON:  None.  FINDINGS: Prior cholecystectomy. Metallic stent is seen to the right of midline, likely biliary stent. Nonobstructive bowel gas pattern. No free air. No suspicious calcification. No acute bony abnormality.  Heart and mediastinal contours are within normal limits. No focal opacities or effusions. No acute bony  abnormality.  IMPRESSION: No evidence of bowel obstruction or free air.  No acute findings.   Electronically Signed   By: Charlett Nose M.D.   On: 11/24/2014 10:56       Disposition and Follow-up:     Discharge Instructions    Discharge instructions    Complete by:  As directed   SOFT DIET     Increase activity slowly    Complete by:  As directed             DISPOSITION: home   DISCHARGE FOLLOW-UP Follow-up Information    Follow up with Freddy Jaksch, MD. Schedule an appointment as soon as possible for a visit in 2 weeks.   Specialty:  Gastroenterology   Why:  for hospital follow-up   Contact information:  [address] [place] [address] [phone]        Time spent on Discharge: 39 mins  Signed:   RAI,RIPUDEEP M.D. [facility] 12/01/2014, 9:43 AM Pager: [phone]

## 2014-12-01 NOTE — Progress Notes (Signed)
Discharge instructions given. Pt verbalized understanding and all questions were answered.  

## 2014-12-02 LAB — CULTURE, BLOOD (ROUTINE X 2)
CULTURE: NO GROWTH
Culture: NO GROWTH

## 2015-01-01 ENCOUNTER — Encounter (HOSPITAL_COMMUNITY): Payer: Self-pay | Admitting: *Deleted

## 2015-01-01 ENCOUNTER — Emergency Department (HOSPITAL_COMMUNITY)
Admission: EM | Admit: 2015-01-01 | Discharge: 2015-01-01 | Disposition: A | Discharge status: Home / Self Care | Payer: Managed Care, Other (non HMO) | Attending: Emergency Medicine | Admitting: Emergency Medicine

## 2015-01-01 ENCOUNTER — Emergency Department (HOSPITAL_COMMUNITY): Payer: Managed Care, Other (non HMO)

## 2015-01-01 DIAGNOSIS — Z880 Allergy status to penicillin: Secondary | ICD-10-CM | POA: Insufficient documentation

## 2015-01-01 DIAGNOSIS — G8929 Other chronic pain: Secondary | ICD-10-CM | POA: Diagnosis not present

## 2015-01-01 DIAGNOSIS — Z8719 Personal history of other diseases of the digestive system: Secondary | ICD-10-CM | POA: Insufficient documentation

## 2015-01-01 DIAGNOSIS — R1013 Epigastric pain: Secondary | ICD-10-CM

## 2015-01-01 DIAGNOSIS — R109 Unspecified abdominal pain: Secondary | ICD-10-CM

## 2015-01-01 DIAGNOSIS — Z79899 Other long term (current) drug therapy: Secondary | ICD-10-CM | POA: Diagnosis not present

## 2015-01-01 DIAGNOSIS — R197 Diarrhea, unspecified: Secondary | ICD-10-CM | POA: Diagnosis not present

## 2015-01-01 DIAGNOSIS — Z9049 Acquired absence of other specified parts of digestive tract: Secondary | ICD-10-CM | POA: Insufficient documentation

## 2015-01-01 DIAGNOSIS — R531 Weakness: Secondary | ICD-10-CM | POA: Diagnosis present

## 2015-01-01 HISTORY — DX: Acute pancreatitis without necrosis or infection, unspecified: K85.90

## 2015-01-01 LAB — URINALYSIS, ROUTINE W REFLEX MICROSCOPIC
Bilirubin Urine: NEGATIVE
Glucose, UA: NEGATIVE mg/dL
Hgb urine dipstick: NEGATIVE
Ketones, ur: NEGATIVE mg/dL
Nitrite: NEGATIVE
Protein, ur: NEGATIVE mg/dL
SPECIFIC GRAVITY, URINE: 1.02 (ref 1.005–1.030)
UROBILINOGEN UA: 0.2 mg/dL (ref 0.0–1.0)
pH: 5.5 (ref 5.0–8.0)

## 2015-01-01 LAB — COMPREHENSIVE METABOLIC PANEL
ALBUMIN: 4.2 g/dL (ref 3.5–5.2)
ALT: 27 U/L (ref 0–35)
AST: 23 U/L (ref 0–37)
Alkaline Phosphatase: 125 U/L — ABNORMAL HIGH (ref 39–117)
Anion gap: 7 (ref 5–15)
BUN: 13 mg/dL (ref 6–23)
CALCIUM: 9.3 mg/dL (ref 8.4–10.5)
CHLORIDE: 105 mmol/L (ref 96–112)
CO2: 26 mmol/L (ref 19–32)
Creatinine, Ser: 0.63 mg/dL (ref 0.50–1.10)
GFR calc Af Amer: >90 mL/min (ref >90)
GFR calc non Af Amer: >90 mL/min (ref >90)
Glucose, Bld: 99 mg/dL (ref 70–99)
Potassium: 4 mmol/L (ref 3.5–5.1)
Sodium: 138 mmol/L (ref 135–145)
TOTAL PROTEIN: 7.4 g/dL (ref 6.0–8.3)
Total Bilirubin: 0.6 mg/dL (ref 0.3–1.2)

## 2015-01-01 LAB — CBC WITH DIFFERENTIAL/PLATELET
Basophils Absolute: 0 10*3/uL (ref 0.0–0.1)
Basophils Relative: 0 % (ref 0–1)
EOS PCT: 0 % (ref 0–5)
Eosinophils Absolute: 0 10*3/uL (ref 0.0–0.7)
HEMATOCRIT: 41.3 % (ref 36.0–46.0)
HEMOGLOBIN: 13.4 g/dL (ref 12.0–15.0)
LYMPHS PCT: 17 % (ref 12–46)
Lymphs Abs: 1.2 10*3/uL (ref 0.7–4.0)
MCH: 27.7 pg (ref 26.0–34.0)
MCHC: 32.4 g/dL (ref 30.0–36.0)
MCV: 85.3 fL (ref 78.0–100.0)
MONOS PCT: 6 % (ref 3–12)
Monocytes Absolute: 0.4 10*3/uL (ref 0.1–1.0)
NEUTROS ABS: 5.4 10*3/uL (ref 1.7–7.7)
Neutrophils Relative %: 77 % (ref 43–77)
Platelets: 210 10*3/uL (ref 150–400)
RBC: 4.84 MIL/uL (ref 3.87–5.11)
RDW: 13.1 % (ref 11.5–15.5)
WBC: 7.1 10*3/uL (ref 4.0–10.5)

## 2015-01-01 LAB — URINE MICROSCOPIC-ADD ON

## 2015-01-01 LAB — LIPASE, BLOOD: LIPASE: 16 U/L (ref 11–59)

## 2015-01-01 MED ORDER — ONDANSETRON HCL 4 MG/2ML IJ SOLN
4.0000 mg | Freq: Once | INTRAMUSCULAR | Status: AC
  Administered 2015-01-01: 4 mg via INTRAVENOUS
  Filled 2015-01-01: qty 2

## 2015-01-01 MED ORDER — IOHEXOL 300 MG/ML  SOLN
25.0000 mL | Freq: Once | INTRAMUSCULAR | Status: AC | PRN
  Administered 2015-01-01: 25 mL via ORAL

## 2015-01-01 MED ORDER — SODIUM CHLORIDE 0.9 % IV BOLUS (SEPSIS): 1000.0000 mL | Freq: Once | INTRAVENOUS | Status: DC

## 2015-01-01 MED ORDER — SODIUM CHLORIDE 0.9 % IV BOLUS (SEPSIS)
1000.0000 mL | Freq: Once | INTRAVENOUS | Status: AC
  Administered 2015-01-01: 1000 mL via INTRAVENOUS

## 2015-01-01 MED ORDER — IOHEXOL 300 MG/ML  SOLN
100.0000 mL | Freq: Once | INTRAMUSCULAR | Status: AC | PRN
Start: 2015-01-01 — End: 2015-01-01
  Administered 2015-01-01: 100 mL via INTRAVENOUS

## 2015-01-01 NOTE — ED Notes (Signed)
Feels weak, nausea, no vomiting, Has had diarrhea.  "hot flashes".  Alert,

## 2015-01-01 NOTE — ED Notes (Signed)
Patient drank water with no difficulty. No reports of nausea or vomiting at this time.

## 2015-01-01 NOTE — Discharge Instructions (Signed)
Diarrhea Take your nausea and pain medication as prescribed. Follow-up with your doctor. Return to the ED for develop worsening pain, worsening diarrhea or any other concerns. Diarrhea is frequent loose and watery bowel movements. It can cause you to feel weak and dehydrated. Dehydration can cause you to become tired and thirsty, have a dry mouth, and have decreased urination that often is dark yellow. Diarrhea is a sign of another problem, most often an infection that will not last long. In most cases, diarrhea typically lasts 2-3 days. However, it can last longer if it is a sign of something more serious. It is important to treat your diarrhea as directed by your caregiver to lessen or prevent future episodes of diarrhea. CAUSES  Some common causes include:  Gastrointestinal infections caused by viruses, bacteria, or parasites.  Food poisoning or food allergies.  Certain medicines, such as antibiotics, chemotherapy, and laxatives.  Artificial sweeteners and fructose.  Digestive disorders. HOME CARE INSTRUCTIONS  Ensure adequate fluid intake (hydration): Have 1 cup (8 oz) of fluid for each diarrhea episode. Avoid fluids that contain simple sugars or sports drinks, fruit juices, whole milk products, and sodas. Your urine should be clear or pale yellow if you are drinking enough fluids. Hydrate with an oral rehydration solution that you can purchase at pharmacies, retail stores, and online. You can prepare an oral rehydration solution at home by mixing the following ingredients together:   - tsp table salt.   tsp baking soda.   tsp salt substitute containing potassium chloride.  1  tablespoons sugar.  1 L (34 oz) of water.  Certain foods and beverages may increase the speed at which food moves through the gastrointestinal (GI) tract. These foods and beverages should be avoided and include:  Caffeinated and alcoholic beverages.  High-fiber foods, such as raw fruits and vegetables,  nuts, seeds, and whole grain breads and cereals.  Foods and beverages sweetened with sugar alcohols, such as xylitol, sorbitol, and mannitol.  Some foods may be well tolerated and may help thicken stool including:  Starchy foods, such as rice, toast, pasta, low-sugar cereal, oatmeal, grits, baked potatoes, crackers, and bagels.  Bananas.  Applesauce.  Add probiotic-rich foods to help increase healthy bacteria in the GI tract, such as yogurt and fermented milk products.  Wash your hands well after each diarrhea episode.  Only take over-the-counter or prescription medicines as directed by your caregiver.  Take a warm bath to relieve any burning or pain from frequent diarrhea episodes. SEEK IMMEDIATE MEDICAL CARE IF:   You are unable to keep fluids down.  You have persistent vomiting.  You have blood in your stool, or your stools are black and tarry.  You do not urinate in 6-8 hours, or there is only a small amount of very dark urine.  You have abdominal pain that increases or localizes.  You have weakness, dizziness, confusion, or light-headedness.  You have a severe headache.  Your diarrhea gets worse or does not get better.  You have a fever or persistent symptoms for more than 2-3 days.  You have a fever and your symptoms suddenly get worse. MAKE SURE YOU:   Understand these instructions.  Will watch your condition.  Will get help right away if you are not doing well or get worse. Document Released: 11/14/2002 Document Revised: 04/10/2014 Document Reviewed: 08/01/2012 [facility] Patient Information 2015 [employer] [place]. This information is not intended to replace advice given to you by your health care provider. Make sure you discuss  any questions you have with your health care provider. ° °

## 2015-01-01 NOTE — ED Provider Notes (Signed)
CSN: [id number]     Arrival date & time 01/01/15  1325 History  This chart was scribed for Ann Octave, MD by Tonye Royalty, ED Scribe. This patient was seen in room APA11/[facility]11 and the patient's care was started at 2:32 PM.    Chief Complaint  Patient presents with  . Weakness   The history is provided by the patient. No language interpreter was used.    HPI Comments: Ann [name] is a 49 y.o. female who presents to the Emergency Department complaining of generalized weakness, mostly in her legs, with onset this morning. She states her legs feel too weak to walk. She states she has not had similar symptoms previously. She notes onset of nausea and green diarrhea last night. She states diarrhea stopped before she went to bed at 1900 but recurred this morning. She notes sleep disturbance due to chills, hot flashes, and restless leg. She states her temperature measured 95-96. She notes she had minor chest pain that has resolved which she attributes to anxiety. She denies sick contacts. She denies travel out of the country. She notes hospitalization 2-3 weeks ago for pancreatitis; she notes swelling to her pancrease. She states she had not had that problem before. She was treated with antibiotics. She notes a stent placed in 2010 with recurrent problems. She states she has left abdominal pain if she turns a certain way as if the stent gets wedged on something. She denise history of DM or other problems besides pancreatitis. She denies vomiting, blood in her diarrhea, dizziness, or lightheadedness.  Past Medical History  Diagnosis Date  . Chronic back pain   . Pancreatitis    Past Surgical History  Procedure Laterality Date  . Bile duct stent placement    . Cholecystectomy     History reviewed. No pertinent family history. History  Substance Use Topics  . Smoking status: Former Games developer  . Smokeless tobacco: Not on file  . Alcohol Use: No   OB History    No data available     Review  of Systems A complete 10 system review of systems was obtained and all systems are negative except as noted in the HPI and PMH.    Allergies  Imitrex; Doxycycline; Lyrica; Amoxicillin-pot clavulanate; and Butrans  Home Medications   Prior to Admission medications   Medication Sig Start Date End Date Taking? Authorizing Provider  ALPRAZolam Prudy Feeler) 0.5 MG tablet Take 0.5 mg by mouth 2 (two) times daily.   Yes Historical Provider, MD  Armodafinil (NUVIGIL) 250 MG tablet Take 250 mg by mouth daily as needed (alertness/awake).    Yes Historical Provider, MD  butalbital-acetaminophen-caffeine (FIORICET, ESGIC) 50-325-40 MG per tablet Take 1-2 tablets by mouth every 6 (six) hours as needed for headache or migraine.   Yes Historical Provider, MD  clonazePAM (KLONOPIN) 1 MG tablet Take 1 tablet (1 mg total) by mouth at bedtime. 12/01/14  Yes Ripudeep K Rai, MD  HYDROmorphone HCl (EXALGO) 32 MG T24A Take 32 mg by mouth daily.    Yes Historical Provider, MD  Oxycodone HCl 20 MG TABS Take 1 tablet by mouth 4 (four) times daily.   Yes Historical Provider, MD  polyethylene glycol (MIRALAX / GLYCOLAX) packet Take 17 g by mouth daily as needed for moderate constipation. 12/01/14  Yes Ripudeep Jenna Luo, MD  promethazine (PHENERGAN) 25 MG tablet Take 1 tablet (25 mg total) by mouth every 6 (six) hours as needed for nausea or vomiting. 12/01/14  Yes Ripudeep Jenna Luo,  MD  senna (SENOKOT) 8.6 MG TABS tablet Take 2 tablets (17.2 mg total) by mouth at bedtime. For constipation 12/01/14  Yes Ripudeep Jenna LuoK Rai, MD  guaiFENesin-dextromethorphan (ROBITUSSIN DM) 100-10 MG/5ML syrup Take 5 mLs by mouth every 4 (four) hours as needed for cough. Patient not taking: Reported on 01/01/2015 12/01/14   Ripudeep K Rai, MD   BP 123/82 mmHg  Pulse 83  Temp(Src) 98.2 F (36.8 C) (Oral)  Resp 18  Ht 5\' 6"  (1.676 m)  Wt 154 lb (69.854 kg)  BMI 24.87 kg/m2  SpO2 98%  LMP 05/16/2012 Physical Exam  Constitutional: She is oriented to  person, place, and time. She appears well-developed and well-nourished. No distress.  HENT:  Head: Normocephalic and atraumatic.  Mouth/Throat: Oropharynx is clear and moist. No oropharyngeal exudate.  Eyes: Conjunctivae and EOM are normal. Pupils are equal, round, and reactive to light.  Neck: Normal range of motion. Neck supple.  No meningismus.  Cardiovascular: Normal rate, regular rhythm, normal heart sounds and intact distal pulses.   No murmur heard. Pulmonary/Chest: Effort normal and breath sounds normal. No respiratory distress.  Abdominal: Soft. There is tenderness (Epigastric and RUQ tenderness. ). There is no rebound and no guarding.  Musculoskeletal: Normal range of motion. She exhibits tenderness. She exhibits no edema.  Paraspinal lumbar tenderness  Neurological: She is alert and oriented to person, place, and time. No cranial nerve deficit. She exhibits normal muscle tone. Coordination normal.  No ataxia on finger to nose bilaterally. No pronator drift. 5/5 strength throughout. CN 2-12 intact. Negative Romberg. Equal grip strength. Sensation intact. Gait is normal.   Skin: Skin is warm.  Psychiatric: She has a normal mood and affect. Her behavior is normal.  Nursing note and vitals reviewed.   ED Course  Procedures (including critical care time)  DIAGNOSTIC STUDIES: Oxygen Saturation is 100% on room air, normal by my interpretation.    COORDINATION OF CARE: 2:42 PM Discussed treatment plan with patient at beside, the patient agrees with the plan and has no further questions at this time.   Labs Review Labs Reviewed  COMPREHENSIVE METABOLIC PANEL - Abnormal; Notable for the following:    Alkaline Phosphatase 125 (*)    All other components within normal limits  URINALYSIS, ROUTINE W REFLEX MICROSCOPIC - Abnormal; Notable for the following:    Leukocytes, UA TRACE (*)    All other components within normal limits  URINE MICROSCOPIC-ADD ON - Abnormal; Notable for the  following:    Squamous Epithelial / LPF FEW (*)    Bacteria, UA FEW (*)    All other components within normal limits  STOOL CULTURE  CLOSTRIDIUM DIFFICILE BY PCR  CBC WITH DIFFERENTIAL/PLATELET  LIPASE, BLOOD  I-STAT CG4 LACTIC ACID, ED    Imaging Review Ct Abdomen Pelvis W Contrast  01/01/2015   CLINICAL DATA:  49 year old female with nausea diarrhea and weakness. Upper abdominal pain, epigastric pain. Recent hospitalization for pancreatitis. Subsequent encounter.  EXAM: CT ABDOMEN AND PELVIS WITH CONTRAST  TECHNIQUE: Multidetector CT imaging of the abdomen and pelvis was performed using the standard protocol following bolus administration of intravenous contrast.  CONTRAST:  25mL OMNIPAQUE IOHEXOL 300 MG/ML SOLN, 100mL OMNIPAQUE IOHEXOL 300 MG/ML SOLN  COMPARISON:  CT Abdomen and Pelvis 11/2014 and earlier.  FINDINGS: Resolved small bilateral pleural effusions and decreased lower lobe atelectasis. Minimal residual scarring or atelectasis greater on the right. Small calcified granuloma just above the right diaphragm. No pericardial effusion.  No acute osseous abnormality identified.  No pelvic free fluid. Decompressed distal colon. Unremarkable bladder.  Negative uterus and adnexa; there is some left gonadal vein venous reflux suspected resulting an early enhancement of the left parametrial soft tissues.  Diverticulosis of the sigmoid colon, no definite mesenteric stranding or active inflammation. Decompressed left colon. Mild wall thickening of the transverse and right colon may be related to under distension. Negative appendix. No dilated small bowel. Oral contrast has not yet reached the terminal ileum. Negative stomach.  Metal CBD stent in place with persistent pneumobilia. The stent terminates in the duodenum, which appears within normal limits. Since 11/24/2014, intermediate density and stranding extending from the pancreatic head to the porta hepatis has regressed and now is virtually resolved.  The gallbladder is surgically absent as before. Pancreatic enhancement appears more normal. No discrete pancreatic mass is identified. No dilatation of the main pancreatic duct is identified. No peripancreatic lymphadenopathy. Liver enhancement is stable with no discrete liver lesion.  Portal venous system is patent. Stable splenomegaly. Adrenal glands and kidneys are stable and within normal limits. Major arterial structures in the abdomen and pelvis are patent with chronic aortoiliac soft and calcified plaque. No abdominal free fluid. No pneumoperitoneum.  IMPRESSION: 1. Metal CBD stent re- identified with regressed and virtually resolved peripancreatic and porta hepatis inflammation since December 2015. No CT evidence of pancreatitis at this time (although CT can be negative in cases of mild acute pancreatitis). No discrete pancreatic mass identified today. 2. Resolved small pleural effusions and improved lung base ventilation. 3. Diverticulosis of the sigmoid colon without definite active inflammation. Apparent mild wall thickening of the transverse colon felt to be artifact due to under distension of the bowel.   Electronically Signed   By: Augusto Gamble M.D.   On: 01/01/2015 17:03     EKG Interpretation None      MDM   Final diagnoses:  Abdominal pain  Diarrhea   Patient states she feels generally weak with nausea after having diarrhea since yesterday. Endorses hot flashes but no documented temperature. Complains of nausea and generalized weakness. 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  WBC normal. Afebrile. UA appears contaminated. Feeling improved after IVF and antiemetics. Resolution of previous pancreatitis and perihepatic inflammation  seen on previous CT.  Patient feeling improved, tolerating PO and ambulatory. No stooling in the ED. Discussed supportive care for diarrhea, follow up with PCP and GI.  Unable to obtain stool cultures. Return precautions given.  BP 123/82 mmHg  Pulse 83  Temp(Src) 98.2 F (36.8 C) (Oral)  Resp 18  Ht  (1.676 m)  Wt 154 lb (69.854 kg)  BMI 24.87 kg/m2  SpO2 98%  LMP 05/16/2012    I personally performed the services described in this documentation, which was scribed in my presence. The recorded information has been reviewed and is accurate.   Ann Octave, MD 01/02/15 0110

## 2015-01-01 NOTE — ED Notes (Signed)
Ambulated pt in hallway; pt's gait is unsteady and she is holding on to the wall rail.She states she feels very weak.

## 2015-01-02 LAB — I-STAT CG4 LACTIC ACID, ED: Lactic Acid, Venous: 0.57 mmol/L (ref 0.5–2.0)

## 2015-02-24 ENCOUNTER — Encounter (HOSPITAL_COMMUNITY): Payer: Self-pay | Admitting: Emergency Medicine

## 2015-02-24 ENCOUNTER — Emergency Department (HOSPITAL_COMMUNITY)
Admission: EM | Admit: 2015-02-24 | Discharge: 2015-02-24 | Disposition: A | Discharge status: Home / Self Care | Payer: Managed Care, Other (non HMO) | Attending: Emergency Medicine | Admitting: Emergency Medicine

## 2015-02-24 DIAGNOSIS — R1013 Epigastric pain: Secondary | ICD-10-CM | POA: Diagnosis present

## 2015-02-24 DIAGNOSIS — Z87891 Personal history of nicotine dependence: Secondary | ICD-10-CM | POA: Diagnosis not present

## 2015-02-24 DIAGNOSIS — Z880 Allergy status to penicillin: Secondary | ICD-10-CM | POA: Insufficient documentation

## 2015-02-24 DIAGNOSIS — G8929 Other chronic pain: Secondary | ICD-10-CM | POA: Diagnosis not present

## 2015-02-24 DIAGNOSIS — Z79899 Other long term (current) drug therapy: Secondary | ICD-10-CM | POA: Diagnosis not present

## 2015-02-24 DIAGNOSIS — K859 Acute pancreatitis, unspecified: Secondary | ICD-10-CM

## 2015-02-24 LAB — HEPATIC FUNCTION PANEL
ALK PHOS: 82 U/L (ref 39–117)
ALT: 40 U/L — AB (ref 0–35)
AST: 33 U/L (ref 0–37)
Albumin: 3.8 g/dL (ref 3.5–5.2)
BILIRUBIN TOTAL: 0.4 mg/dL (ref 0.3–1.2)
Bilirubin, Direct: <0.1 mg/dL (ref 0.0–0.5)
Total Protein: 6.7 g/dL (ref 6.0–8.3)

## 2015-02-24 LAB — CBC WITH DIFFERENTIAL/PLATELET
Basophils Absolute: 0 10*3/uL (ref 0.0–0.1)
Basophils Relative: 0 % (ref 0–1)
EOS ABS: 0.1 10*3/uL (ref 0.0–0.7)
Eosinophils Relative: 2 % (ref 0–5)
HCT: 40.2 % (ref 36.0–46.0)
Hemoglobin: 13.4 g/dL (ref 12.0–15.0)
LYMPHS ABS: 2.1 10*3/uL (ref 0.7–4.0)
Lymphocytes Relative: 27 % (ref 12–46)
MCH: 29.2 pg (ref 26.0–34.0)
MCHC: 33.3 g/dL (ref 30.0–36.0)
MCV: 87.6 fL (ref 78.0–100.0)
Monocytes Absolute: 0.6 10*3/uL (ref 0.1–1.0)
Monocytes Relative: 8 % (ref 3–12)
Neutro Abs: 4.8 10*3/uL (ref 1.7–7.7)
Neutrophils Relative %: 63 % (ref 43–77)
PLATELETS: 186 10*3/uL (ref 150–400)
RBC: 4.59 MIL/uL (ref 3.87–5.11)
RDW: 13.8 % (ref 11.5–15.5)
WBC: 7.6 10*3/uL (ref 4.0–10.5)

## 2015-02-24 LAB — BASIC METABOLIC PANEL
Anion gap: 7 (ref 5–15)
BUN: 13 mg/dL (ref 6–23)
CHLORIDE: 105 mmol/L (ref 96–112)
CO2: 28 mmol/L (ref 19–32)
Calcium: 8.9 mg/dL (ref 8.4–10.5)
Creatinine, Ser: 0.7 mg/dL (ref 0.50–1.10)
GFR calc Af Amer: >90 mL/min (ref >90)
Glucose, Bld: 108 mg/dL — ABNORMAL HIGH (ref 70–99)
POTASSIUM: 4 mmol/L (ref 3.5–5.1)
Sodium: 140 mmol/L (ref 135–145)

## 2015-02-24 LAB — URINALYSIS, ROUTINE W REFLEX MICROSCOPIC
Bilirubin Urine: NEGATIVE
GLUCOSE, UA: NEGATIVE mg/dL
HGB URINE DIPSTICK: NEGATIVE
Ketones, ur: NEGATIVE mg/dL
LEUKOCYTES UA: NEGATIVE
Nitrite: NEGATIVE
PH: 6.5 (ref 5.0–8.0)
Protein, ur: NEGATIVE mg/dL
Specific Gravity, Urine: 1.029 (ref 1.005–1.030)
UROBILINOGEN UA: 1 mg/dL (ref 0.0–1.0)

## 2015-02-24 LAB — AMYLASE: AMYLASE: 61 U/L (ref 0–105)

## 2015-02-24 LAB — LIPASE, BLOOD: Lipase: 19 U/L (ref 11–59)

## 2015-02-24 MED ORDER — HYDROMORPHONE HCL 1 MG/ML IJ SOLN
1.0000 mg | Freq: Once | INTRAMUSCULAR | Status: AC
  Administered 2015-02-24: 1 mg via INTRAVENOUS
  Filled 2015-02-24: qty 1

## 2015-02-24 MED ORDER — ONDANSETRON HCL 4 MG/2ML IJ SOLN
4.0000 mg | Freq: Once | INTRAMUSCULAR | Status: AC
  Administered 2015-02-24: 4 mg via INTRAVENOUS
  Filled 2015-02-24: qty 2

## 2015-02-24 MED ORDER — SODIUM CHLORIDE 0.9 % IV BOLUS (SEPSIS)
1000.0000 mL | Freq: Once | INTRAVENOUS | Status: AC
  Administered 2015-02-24: 1000 mL via INTRAVENOUS

## 2015-02-24 NOTE — ED Provider Notes (Signed)
CSN: [id number]     Arrival date & time 02/24/15  1448 History   First MD Initiated Contact with Patient 02/24/15 1559     Chief Complaint  Patient presents with  . Abdominal Pain  . Migraine     (Consider location/radiation/quality/duration/timing/severity/associated sxs/prior Treatment) Patient is a 49 y.o. female presenting with abdominal pain. The history is provided by the patient.  Abdominal Pain Pain location:  Epigastric Pain quality: sharp   Pain radiates to:  Back Pain severity:  Severe Onset quality:  Gradual Duration:  2 days Timing:  Constant Progression:  Worsening Chronicity:  Recurrent Context: not alcohol use   Relieved by:  Nothing Worsened by:  Eating Associated symptoms: anorexia and nausea   Associated symptoms: no chest pain, no constipation, no cough, no diarrhea, no shortness of breath and no vomiting   Risk factors: no alcohol abuse     Past Medical History  Diagnosis Date  . Chronic back pain   . Pancreatitis    Past Surgical History  Procedure Laterality Date  . Bile duct stent placement    . Cholecystectomy     No family history on file. History  Substance Use Topics  . Smoking status: Former Games developer  . Smokeless tobacco: Not on file  . Alcohol Use: No   OB History    No data available     Review of Systems  Respiratory: Negative for cough, chest tightness and shortness of breath.   Cardiovascular: Negative for chest pain and leg swelling.  Gastrointestinal: Positive for nausea, abdominal pain and anorexia. Negative for vomiting, diarrhea, constipation and abdominal distention.  All other systems reviewed and are negative.     Allergies  Imitrex; Doxycycline; Lyrica; Amoxicillin-pot clavulanate; and Butrans  Home Medications   Prior to Admission medications   Medication Sig Start Date End Date Taking? Authorizing Provider  ALPRAZolam Prudy Feeler) 0.5 MG tablet Take 0.5 mg by mouth 2 (two) times daily.    Historical Provider, MD   Armodafinil (NUVIGIL) 250 MG tablet Take 250 mg by mouth daily as needed (alertness/awake).     Historical Provider, MD  busPIRone (BUSPAR) 10 MG tablet Take 10 mg by mouth 2 (two) times daily. 02/15/15   Historical Provider, MD  butalbital-acetaminophen-caffeine (FIORICET, ESGIC) 50-325-40 MG per tablet Take 1-2 tablets by mouth every 6 (six) hours as needed for headache or migraine.    Historical Provider, MD  clonazePAM (KLONOPIN) 1 MG tablet Take 1 tablet (1 mg total) by mouth at bedtime. 12/01/14   Ripudeep Jenna Luo, MD  guaiFENesin-dextromethorphan (ROBITUSSIN DM) 100-10 MG/5ML syrup Take 5 mLs by mouth every 4 (four) hours as needed for cough. Patient not taking: Reported on 01/01/2015 12/01/14   Ripudeep Jenna Luo, MD  HYDROmorphone HCl (EXALGO) 32 MG T24A Take 32 mg by mouth daily.     Historical Provider, MD  metoprolol tartrate (LOPRESSOR) 25 MG tablet Take 25 mg by mouth daily. 02/14/15   Historical Provider, MD  Oxycodone HCl 20 MG TABS Take 1 tablet by mouth 4 (four) times daily.    Historical Provider, MD  polyethylene glycol (MIRALAX / GLYCOLAX) packet Take 17 g by mouth daily as needed for moderate constipation. 12/01/14   Ripudeep Jenna Luo, MD  promethazine (PHENERGAN) 25 MG tablet Take 1 tablet (25 mg total) by mouth every 6 (six) hours as needed for nausea or vomiting. 12/01/14   Ripudeep Jenna Luo, MD  senna (SENOKOT) 8.6 MG TABS tablet Take 2 tablets (17.2 mg total) by mouth at  bedtime. For constipation 12/01/14   Ripudeep K Rai, MD   BP 158/82 mmHg  Pulse 85  Temp(Src) 98 F (36.7 C) (Oral)  Resp 16  SpO2 95%  LMP 05/16/2012 Physical Exam  Constitutional: She appears well-developed and well-nourished. No distress.  HENT:  Head: Normocephalic and atraumatic.  Mouth/Throat: Oropharynx is clear and moist. No oropharyngeal exudate.  Eyes: EOM are normal. Pupils are equal, round, and reactive to light.  Cardiovascular: Normal rate, regular rhythm, normal heart sounds and intact distal  pulses.  Exam reveals no gallop and no friction rub.   No murmur heard. Pulmonary/Chest: Effort normal and breath sounds normal. No respiratory distress. She has no wheezes. She has no rales.  Abdominal: Soft. Bowel sounds are normal. She exhibits no distension and no mass. There is tenderness (epigastric). There is no rebound and no guarding.  Skin: Skin is warm and dry. No rash noted. She is not diaphoretic.  Psychiatric: She has a normal mood and affect. Her behavior is normal. Judgment and thought content normal.  Nursing note and vitals reviewed.   ED Course  Procedures (including critical care time) Labs Review Labs Reviewed  BASIC METABOLIC PANEL - Abnormal; Notable for the following:    Glucose, Bld 108 (*)    All other components within normal limits  HEPATIC FUNCTION PANEL - Abnormal; Notable for the following:    ALT 40 (*)    All other components within normal limits  AMYLASE  CBC WITH DIFFERENTIAL/PLATELET  URINALYSIS, ROUTINE W REFLEX MICROSCOPIC  LIPASE, BLOOD    Imaging Review No results found.   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 Epigastric pain with nausea but no vomiting. Symptoms started yesterday and have been worsening despite home care. Has been doing antiemetics and pain medicines at home. Is tolerating fluids but has not been eating.  We'll send laboratory workup. No indication for repeat imaging in this patient with known pancreatitis and benign abdominal exam. Afebrile and hematocrit stable. Pain control, fluids, and nausea medicines given. Will decide if she can tolerate by mouth and disposition pending workup.  6:00 PM labs reassuring. Lipase negative. LFTs within normal limits. Abdominal exam remains benign. The patient is tolerating by mouth and  pain is moderately improved. She is a chronic pain patient and will follow up with her clinic for adjustments to her pain regimen. She has plenty of antibiotics at home and requests no additional. She will be compliant with a liquid diet and will follow up with her primary care physician within the next 2-3 days. She also has gastroenterology referral.  Dorna LeitzAlex Trinita Devlin, MD 02/24/15 [id number]  Margarita Grizzleanielle Ray, MD 02/24/15 1827

## 2015-02-24 NOTE — Discharge Instructions (Signed)

## 2015-02-24 NOTE — ED Notes (Signed)
Pt. Stated, I have stomach pain and I thin its pancreatitis like it was last time. I also have a headache.

## 2015-05-20 ENCOUNTER — Encounter (HOSPITAL_COMMUNITY): Payer: Self-pay | Admitting: Emergency Medicine

## 2015-05-20 ENCOUNTER — Emergency Department (HOSPITAL_COMMUNITY)
Admission: EM | Admit: 2015-05-20 | Discharge: 2015-05-20 | Disposition: A | Discharge status: Home / Self Care | Payer: Managed Care, Other (non HMO) | Attending: Emergency Medicine | Admitting: Emergency Medicine

## 2015-05-20 DIAGNOSIS — Z8719 Personal history of other diseases of the digestive system: Secondary | ICD-10-CM | POA: Insufficient documentation

## 2015-05-20 DIAGNOSIS — R110 Nausea: Secondary | ICD-10-CM | POA: Insufficient documentation

## 2015-05-20 DIAGNOSIS — R5381 Other malaise: Secondary | ICD-10-CM | POA: Diagnosis not present

## 2015-05-20 DIAGNOSIS — R531 Weakness: Secondary | ICD-10-CM | POA: Diagnosis not present

## 2015-05-20 DIAGNOSIS — G8929 Other chronic pain: Secondary | ICD-10-CM | POA: Diagnosis not present

## 2015-05-20 DIAGNOSIS — Z79899 Other long term (current) drug therapy: Secondary | ICD-10-CM | POA: Insufficient documentation

## 2015-05-20 DIAGNOSIS — Z880 Allergy status to penicillin: Secondary | ICD-10-CM | POA: Diagnosis not present

## 2015-05-20 DIAGNOSIS — Z87891 Personal history of nicotine dependence: Secondary | ICD-10-CM | POA: Diagnosis not present

## 2015-05-20 DIAGNOSIS — Z3202 Encounter for pregnancy test, result negative: Secondary | ICD-10-CM | POA: Insufficient documentation

## 2015-05-20 LAB — CBC WITH DIFFERENTIAL/PLATELET
BASOS PCT: 0 % (ref 0–1)
Basophils Absolute: 0 10*3/uL (ref 0.0–0.1)
EOS ABS: 0.1 10*3/uL (ref 0.0–0.7)
EOS PCT: 1 % (ref 0–5)
HCT: 41.5 % (ref 36.0–46.0)
HEMOGLOBIN: 14.3 g/dL (ref 12.0–15.0)
LYMPHS ABS: 1.4 10*3/uL (ref 0.7–4.0)
LYMPHS PCT: 20 % (ref 12–46)
MCH: 29.1 pg (ref 26.0–34.0)
MCHC: 34.5 g/dL (ref 30.0–36.0)
MCV: 84.5 fL (ref 78.0–100.0)
MONO ABS: 0.5 10*3/uL (ref 0.1–1.0)
Monocytes Relative: 7 % (ref 3–12)
Neutro Abs: 5 10*3/uL (ref 1.7–7.7)
Neutrophils Relative %: 72 % (ref 43–77)
PLATELETS: 204 10*3/uL (ref 150–400)
RBC: 4.91 MIL/uL (ref 3.87–5.11)
RDW: 12.7 % (ref 11.5–15.5)
WBC: 6.9 10*3/uL (ref 4.0–10.5)

## 2015-05-20 LAB — COMPREHENSIVE METABOLIC PANEL
ALBUMIN: 4.4 g/dL (ref 3.5–5.0)
ALT: 15 U/L (ref 14–54)
AST: 20 U/L (ref 15–41)
Alkaline Phosphatase: 74 U/L (ref 38–126)
Anion gap: 9 (ref 5–15)
BUN: 13 mg/dL (ref 6–20)
CHLORIDE: 108 mmol/L (ref 101–111)
CO2: 25 mmol/L (ref 22–32)
Calcium: 9.1 mg/dL (ref 8.9–10.3)
Creatinine, Ser: 0.6 mg/dL (ref 0.44–1.00)
GFR calc Af Amer: >60 mL/min (ref >60)
GFR calc non Af Amer: >60 mL/min (ref >60)
GLUCOSE: 98 mg/dL (ref 65–99)
POTASSIUM: 3.7 mmol/L (ref 3.5–5.1)
SODIUM: 142 mmol/L (ref 135–145)
Total Bilirubin: 0.7 mg/dL (ref 0.3–1.2)
Total Protein: 7.3 g/dL (ref 6.5–8.1)

## 2015-05-20 LAB — URINALYSIS, ROUTINE W REFLEX MICROSCOPIC
Bilirubin Urine: NEGATIVE
Glucose, UA: NEGATIVE mg/dL
Hgb urine dipstick: NEGATIVE
Ketones, ur: NEGATIVE mg/dL
Nitrite: NEGATIVE
Protein, ur: NEGATIVE mg/dL
Specific Gravity, Urine: <1.005 — ABNORMAL LOW (ref 1.005–1.030)
UROBILINOGEN UA: 0.2 mg/dL (ref 0.0–1.0)
pH: 7 (ref 5.0–8.0)

## 2015-05-20 LAB — URINE MICROSCOPIC-ADD ON

## 2015-05-20 LAB — PREGNANCY, URINE: Preg Test, Ur: NEGATIVE

## 2015-05-20 MED ORDER — SODIUM CHLORIDE 0.9 % IV BOLUS (SEPSIS)
1000.0000 mL | Freq: Once | INTRAVENOUS | Status: AC
  Administered 2015-05-20: 1000 mL via INTRAVENOUS

## 2015-05-20 NOTE — ED Provider Notes (Signed)
CSN: [id number]     Arrival date & time 05/20/15  1109 History   First MD Initiated Contact with Patient 05/20/15 1313     Chief Complaint  Patient presents with  . Weakness     Patient is a 49 y.o. female presenting with weakness. The history is provided by the patient. No language interpreter was used.  Weakness   Ms. Sevcik presents for evaluation of generalized weakness.  She states three days of intermittent nausea, decreased oral liquid intake.  She feels like she is dragging when she tries to walk.  She denies any fevers, chest pain, cough, SOB, abdominal pain.  She had some diarrhea this morning.  No hematochezia or melena.  Sxs are mild, constant, worsening.  No dysuria or change in urinary output.  She is concerned that she is dehydrated due to decreased fluid intake and weakness.    Past Medical History  Diagnosis Date  . Chronic back pain   . Pancreatitis    Past Surgical History  Procedure Laterality Date  . Bile duct stent placement    . Cholecystectomy     History reviewed. No pertinent family history. History  Substance Use Topics  . Smoking status: Former Games developer  . Smokeless tobacco: Not on file  . Alcohol Use: No   OB History    Gravida Para Term Preterm AB TAB SAB Ectopic Multiple Living            2     Review of Systems  Neurological: Positive for weakness.  All other systems reviewed and are negative.     Allergies  Imitrex; Doxycycline; Lyrica; Nucynta; Amoxicillin-pot clavulanate; and Butrans  Home Medications   Prior to Admission medications   Medication Sig Start Date End Date Taking? Authorizing Provider  ALPRAZolam Prudy Feeler) 0.5 MG tablet Take 0.5 mg by mouth 3 (three) times daily.    Yes Historical Provider, MD  butalbital-acetaminophen-caffeine (FIORICET, ESGIC) 50-325-40 MG per tablet Take 1-2 tablets by mouth every 6 (six) hours as needed for headache or migraine.   Yes Historical Provider, MD  ibuprofen (ADVIL,MOTRIN) 800 MG tablet  Take 800 mg by mouth every 8 (eight) hours as needed for moderate pain.   Yes Historical Provider, MD  naproxen sodium (ANAPROX) 220 MG tablet Take 660 mg by mouth daily as needed (pain).   Yes Historical Provider, MD  promethazine (PHENERGAN) 25 MG tablet Take 1 tablet (25 mg total) by mouth every 6 (six) hours as needed for nausea or vomiting. 12/01/14  Yes Ripudeep Jenna Luo, MD  senna (SENOKOT) 8.6 MG TABS tablet Take 2 tablets (17.2 mg total) by mouth at bedtime. For constipation 12/01/14  Yes Ripudeep Jenna Luo, MD  clonazePAM (KLONOPIN) 1 MG tablet Take 1 tablet (1 mg total) by mouth at bedtime. Patient not taking: Reported on 05/20/2015 12/01/14   Ripudeep Jenna Luo, MD  guaiFENesin-dextromethorphan (ROBITUSSIN DM) 100-10 MG/5ML syrup Take 5 mLs by mouth every 4 (four) hours as needed for cough. Patient not taking: Reported on 01/01/2015 12/01/14   Ripudeep Jenna Luo, MD  polyethylene glycol (MIRALAX / GLYCOLAX) packet Take 17 g by mouth daily as needed for moderate constipation. Patient not taking: Reported on 05/20/2015 12/01/14   Ripudeep K Rai, MD   BP 136/96 mmHg  Pulse 85  Temp(Src) 98.6 F (37 C) (Oral)  Resp 18  Ht  (1.676 m)  Wt 153 lb (69.4 kg)  BMI 24.71 kg/m2  SpO2 100%  LMP 05/16/2012 Physical Exam  Constitutional: She  is oriented to person, place, and time. She appears well-developed and well-nourished.  HENT:  Head: Normocephalic and atraumatic.  Cardiovascular: Normal rate and regular rhythm.   No murmur heard. Pulmonary/Chest: Effort normal and breath sounds normal. No respiratory distress.  Abdominal: Soft. There is no tenderness. There is no rebound and no guarding.  Musculoskeletal: She exhibits no edema or tenderness.  Neurological: She is alert and oriented to person, place, and time.  Skin: Skin is warm and dry.  Psychiatric: She has a normal mood and affect. Her behavior is normal.  Nursing note and vitals reviewed.   ED Course  Procedures (including critical  care time) Labs Review Labs Reviewed  URINALYSIS, ROUTINE W REFLEX MICROSCOPIC (NOT AT [facility]) - Abnormal; Notable for the following:    Specific Gravity, Urine <1.005 (*)    Leukocytes, UA TRACE (*)    All other components within normal limits  CBC WITH DIFFERENTIAL/PLATELET  COMPREHENSIVE METABOLIC PANEL  PREGNANCY, URINE  URINE MICROSCOPIC-ADD ON    Imaging Review No results found.   EKG Interpretation None      MDM   Final diagnoses:  Asthenia  Nausea    Patient here for evaluation of generalized weakness, intermittent nausea, malaise. Patient nontoxic appearing on examination with no focal neurologic deficits. Discussed with patient unclear cause of her symptoms. Recommend outpatient follow-up and close return impressions were discussed. Presentation is not consistent with serious bacterial infection, CVA, GBS.    Tilden Fossa, MD 05/21/15 1025

## 2015-05-20 NOTE — Discharge Instructions (Signed)
Fatigue °Fatigue is a feeling of tiredness, lack of energy, lack of motivation, or feeling tired all the time. Having enough rest, good nutrition, and reducing stress will normally reduce fatigue. Consult your caregiver if it persists. The nature of your fatigue will help your caregiver to find out its cause. The treatment is based on the cause.  °CAUSES  °There are many causes for fatigue. Most of the time, fatigue can be traced to one or more of your habits or routines. Most causes fit into one or more of three general areas. They are: °Lifestyle problems °· Sleep disturbances. °· Overwork. °· Physical exertion. °· Unhealthy habits. °¨ Poor eating habits or eating disorders. °¨ Alcohol and/or drug use . °¨ Lack of proper nutrition (malnutrition). °Psychological problems °· Stress and/or anxiety problems. °· Depression. °· Grief. °· Boredom. °Medical Problems or Conditions °· Anemia. °· Pregnancy. °· Thyroid gland problems. °· Recovery from major surgery. °· Continuous pain. °· Emphysema or asthma that is not well controlled °· Allergic conditions. °· Diabetes. °· Infections (such as mononucleosis). °· Obesity. °· Sleep disorders, such as sleep apnea. °· Heart failure or other heart-related problems. °· Cancer. °· Kidney disease. °· Liver disease. °· Effects of certain medicines such as antihistamines, cough and cold remedies, prescription pain medicines, heart and blood pressure medicines, drugs used for treatment of cancer, and some antidepressants. °SYMPTOMS  °The symptoms of fatigue include:  °· Lack of energy. °· Lack of drive (motivation). °· Drowsiness. °· Feeling of indifference to the surroundings. °DIAGNOSIS  °The details of how you feel help guide your caregiver in finding out what is causing the fatigue. You will be asked about your present and past health condition. It is important to review all medicines that you take, including prescription and non-prescription items. A thorough exam will be done.  You will be questioned about your feelings, habits, and normal lifestyle. Your caregiver may suggest blood tests, urine tests, or other tests to look for common medical causes of fatigue.  °TREATMENT  °Fatigue is treated by correcting the underlying cause. For example, if you have continuous pain or depression, treating these causes will improve how you feel. Similarly, adjusting the dose of certain medicines will help in reducing fatigue.  °HOME CARE INSTRUCTIONS  °· Try to get the required amount of good sleep every night. °· Eat a healthy and nutritious diet, and drink enough water throughout the day. °· Practice ways of relaxing (including yoga or meditation). °· Exercise regularly. °· Make plans to change situations that cause stress. Act on those plans so that stresses decrease over time. Keep your work and personal routine reasonable. °· Avoid street drugs and minimize use of alcohol. °· Start taking a daily multivitamin after consulting your caregiver. °SEEK MEDICAL CARE IF:  °· You have persistent tiredness, which cannot be accounted for. °· You have fever. °· You have unintentional weight loss. °· You have headaches. °· You have disturbed sleep throughout the night. °· You are feeling sad. °· You have constipation. °· You have dry skin. °· You have gained weight. °· You are taking any new or different medicines that you suspect are causing fatigue. °· You are unable to sleep at night. °· You develop any unusual swelling of your legs or other parts of your body. °SEEK IMMEDIATE MEDICAL CARE IF:  °· You are feeling confused. °· Your vision is blurred. °· You feel faint or pass out. °· You develop severe headache. °· You develop severe abdominal, pelvic, or   back pain. °· You develop chest pain, shortness of breath, or an irregular or fast heartbeat. °· You are unable to pass a normal amount of urine. °· You develop abnormal bleeding such as bleeding from the rectum or you vomit blood. °· You have thoughts  about harming yourself or committing suicide. °· You are worried that you might harm someone else. °MAKE SURE YOU:  °· Understand these instructions. °· Will watch your condition. °· Will get help right away if you are not doing well or get worse. °Document Released: 09/21/2007 Document Revised: 02/16/2012 Document Reviewed: 03/28/2014 °ExitCare® Patient Information ©2015 [employer]. This information is not intended to replace advice given to you by your health care provider. Make sure you discuss any questions you have with your health care provider. ° °Nausea, Adult °Nausea is the feeling that you have an upset stomach or have to vomit. Nausea by itself is not likely a serious concern, but it may be an early sign of more serious medical problems. As nausea gets worse, it can lead to vomiting. If vomiting develops, there is the risk of dehydration.  °CAUSES  °· Viral infections. °· Food poisoning. °· Medicines. °· Pregnancy. °· Motion sickness. °· Migraine headaches. °· Emotional distress. °· Severe pain from any source. °· Alcohol intoxication. °HOME CARE INSTRUCTIONS °· Get plenty of rest. °· Ask your caregiver about specific rehydration instructions. °· Eat small amounts of food and sip liquids more often. °· Take all medicines as told by your caregiver. °SEEK MEDICAL CARE IF: °· You have not improved after 2 days, or you get worse. °· You have a headache. °SEEK IMMEDIATE MEDICAL CARE IF:  °· You have a fever. °· You faint. °· You keep vomiting or have blood in your vomit. °· You are extremely weak or dehydrated. °· You have dark or bloody stools. °· You have severe chest or abdominal pain. °MAKE SURE YOU: °· Understand these instructions. °· Will watch your condition. °· Will get help right away if you are not doing well or get worse. °Document Released: 01/01/2005 Document Revised: 08/18/2012 Document Reviewed: 08/06/2011 °ExitCare® Patient Information ©2015 [employer]. This information is not intended  to replace advice given to you by your health care provider. Make sure you discuss any questions you have with your health care provider. ° °

## 2015-05-20 NOTE — ED Notes (Signed)
PT states loss of appetite and not drinking adequate amount of fluids per pt x3 days with generalized weakness and diarrhea x3 starting today. PT denies any fever or any urinary symptoms.

## 2015-06-06 ENCOUNTER — Emergency Department (HOSPITAL_COMMUNITY)
Admission: EM | Admit: 2015-06-06 | Discharge: 2015-06-06 | Disposition: A | Discharge status: Home / Self Care | Payer: Managed Care, Other (non HMO) | Attending: Emergency Medicine | Admitting: Emergency Medicine

## 2015-06-06 ENCOUNTER — Encounter (HOSPITAL_COMMUNITY): Payer: Self-pay

## 2015-06-06 DIAGNOSIS — R51 Headache: Secondary | ICD-10-CM

## 2015-06-06 DIAGNOSIS — R630 Anorexia: Secondary | ICD-10-CM | POA: Diagnosis not present

## 2015-06-06 DIAGNOSIS — Z79899 Other long term (current) drug therapy: Secondary | ICD-10-CM | POA: Insufficient documentation

## 2015-06-06 DIAGNOSIS — G43909 Migraine, unspecified, not intractable, without status migrainosus: Secondary | ICD-10-CM | POA: Diagnosis not present

## 2015-06-06 DIAGNOSIS — Z87891 Personal history of nicotine dependence: Secondary | ICD-10-CM | POA: Insufficient documentation

## 2015-06-06 DIAGNOSIS — Z880 Allergy status to penicillin: Secondary | ICD-10-CM | POA: Diagnosis not present

## 2015-06-06 DIAGNOSIS — G8929 Other chronic pain: Secondary | ICD-10-CM | POA: Diagnosis not present

## 2015-06-06 DIAGNOSIS — Z8719 Personal history of other diseases of the digestive system: Secondary | ICD-10-CM | POA: Diagnosis not present

## 2015-06-06 DIAGNOSIS — R197 Diarrhea, unspecified: Secondary | ICD-10-CM | POA: Insufficient documentation

## 2015-06-06 DIAGNOSIS — R519 Headache, unspecified: Secondary | ICD-10-CM

## 2015-06-06 DIAGNOSIS — M1990 Unspecified osteoarthritis, unspecified site: Secondary | ICD-10-CM | POA: Diagnosis not present

## 2015-06-06 DIAGNOSIS — M542 Cervicalgia: Secondary | ICD-10-CM | POA: Insufficient documentation

## 2015-06-06 HISTORY — DX: Unspecified osteoarthritis, unspecified site: M19.90

## 2015-06-06 HISTORY — DX: Migraine, unspecified, not intractable, without status migrainosus: G43.909

## 2015-06-06 LAB — CBC WITH DIFFERENTIAL/PLATELET
Basophils Absolute: 0 10*3/uL (ref 0.0–0.1)
Basophils Relative: 0 % (ref 0–1)
EOS ABS: 0 10*3/uL (ref 0.0–0.7)
EOS PCT: 0 % (ref 0–5)
HCT: 40.8 % (ref 36.0–46.0)
Hemoglobin: 14.2 g/dL (ref 12.0–15.0)
Lymphocytes Relative: 19 % (ref 12–46)
Lymphs Abs: 1.5 10*3/uL (ref 0.7–4.0)
MCH: 29.3 pg (ref 26.0–34.0)
MCHC: 34.8 g/dL (ref 30.0–36.0)
MCV: 84.3 fL (ref 78.0–100.0)
Monocytes Absolute: 0.7 10*3/uL (ref 0.1–1.0)
Monocytes Relative: 9 % (ref 3–12)
Neutro Abs: 5.8 10*3/uL (ref 1.7–7.7)
Neutrophils Relative %: 72 % (ref 43–77)
PLATELETS: 195 10*3/uL (ref 150–400)
RBC: 4.84 MIL/uL (ref 3.87–5.11)
RDW: 12.9 % (ref 11.5–15.5)
WBC: 8 10*3/uL (ref 4.0–10.5)

## 2015-06-06 LAB — COMPREHENSIVE METABOLIC PANEL
ALBUMIN: 4.4 g/dL (ref 3.5–5.0)
ALT: 12 U/L — ABNORMAL LOW (ref 14–54)
AST: 14 U/L — ABNORMAL LOW (ref 15–41)
Alkaline Phosphatase: 75 U/L (ref 38–126)
Anion gap: 11 (ref 5–15)
BUN: 16 mg/dL (ref 6–20)
CALCIUM: 9.1 mg/dL (ref 8.9–10.3)
CO2: 19 mmol/L — ABNORMAL LOW (ref 22–32)
CREATININE: 0.66 mg/dL (ref 0.44–1.00)
Chloride: 112 mmol/L — ABNORMAL HIGH (ref 101–111)
GFR calc Af Amer: >60 mL/min (ref >60)
GFR calc non Af Amer: >60 mL/min (ref >60)
Glucose, Bld: 91 mg/dL (ref 65–99)
Potassium: 3.2 mmol/L — ABNORMAL LOW (ref 3.5–5.1)
Sodium: 142 mmol/L (ref 135–145)
Total Bilirubin: 0.8 mg/dL (ref 0.3–1.2)
Total Protein: 7.7 g/dL (ref 6.5–8.1)

## 2015-06-06 LAB — LIPASE, BLOOD: LIPASE: 13 U/L — AB (ref 22–51)

## 2015-06-06 MED ORDER — PROMETHAZINE HCL 25 MG PO TABS: 25.0000 mg | ORAL_TABLET | Freq: Four times a day (QID) | ORAL | Status: DC | PRN

## 2015-06-06 MED ORDER — DIPHENHYDRAMINE HCL 50 MG/ML IJ SOLN
25.0000 mg | Freq: Once | INTRAMUSCULAR | Status: AC
  Administered 2015-06-06: 25 mg via INTRAVENOUS
  Filled 2015-06-06: qty 1

## 2015-06-06 MED ORDER — PROCHLORPERAZINE EDISYLATE 5 MG/ML IJ SOLN
10.0000 mg | Freq: Once | INTRAMUSCULAR | Status: AC
  Administered 2015-06-06: 10 mg via INTRAVENOUS
  Filled 2015-06-06: qty 2

## 2015-06-06 MED ORDER — SODIUM CHLORIDE 0.9 % IV BOLUS (SEPSIS)
1000.0000 mL | Freq: Once | INTRAVENOUS | Status: AC
  Administered 2015-06-06: 1000 mL via INTRAVENOUS

## 2015-06-06 NOTE — Discharge Instructions (Signed)

## 2015-06-06 NOTE — ED Notes (Signed)
Pt c/o headache since Saturday.  REports started having n/v/d at 3am today.  EMS reports pt started gabapentin Saturday as well.  CBG 77.  EMS administered approx 400ml of saline IV and gave 4mg  zofran IV.

## 2015-06-06 NOTE — ED Notes (Signed)
Patient ambulatory to restroom. Steady gait.  

## 2015-06-06 NOTE — ED Provider Notes (Signed)
CSN: [id number]     Arrival date & time 06/06/15  1227 History   First MD Initiated Contact with Patient 06/06/15 1422     Chief Complaint  Patient presents with  . Headache     (Consider location/radiation/quality/duration/timing/severity/associated sxs/prior Treatment) Patient is a 49 y.o. female presenting with headaches. The history is provided by the patient.  Headache Associated symptoms: abdominal pain, diarrhea, nausea, neck pain, photophobia and vomiting   Associated symptoms: no back pain, no eye pain, no neck stiffness, no numbness and no weakness    patient presents with headache that began 4 days ago. States it is severe but she has difficulty describing. Is on the front of her head. No fevers or states she's had chills. She's had nausea vomiting. She's had some loose stools. Recently has had some nausea vomiting diarrhea but is not completely resolved. States she has pain with light and sound. No trauma. No numbness weakness. No confusion. No vision changes. No pain in her neck but states she does have pain in her lower back.  Past Medical History  Diagnosis Date  . Chronic back pain   . Pancreatitis   . Arthritis   . Migraines    Past Surgical History  Procedure Laterality Date  . Bile duct stent placement    . Cholecystectomy     No family history on file. History  Substance Use Topics  . Smoking status: Former Games developer  . Smokeless tobacco: Not on file  . Alcohol Use: No   OB History    Gravida Para Term Preterm AB TAB SAB Ectopic Multiple Living            2     Review of Systems  Constitutional: Positive for appetite change. Negative for activity change.  Eyes: Positive for photophobia. Negative for pain.  Respiratory: Negative for chest tightness and shortness of breath.   Cardiovascular: Negative for chest pain and leg swelling.  Gastrointestinal: Positive for nausea, vomiting, abdominal pain and diarrhea.       States mild mid abdominal pain like she  did too many sit ups  Genitourinary: Negative for flank pain.  Musculoskeletal: Positive for neck pain. Negative for back pain and neck stiffness.  Skin: Negative for rash.  Neurological: Positive for headaches. Negative for weakness and numbness.  Psychiatric/Behavioral: Negative for behavioral problems.      Allergies  Imitrex; Doxycycline; Lyrica; Nucynta; Other; Amoxicillin-pot clavulanate; and Butrans  Home Medications   Prior to Admission medications   Medication Sig Start Date End Date Taking? Authorizing Provider  ALPRAZolam Prudy Feeler) 0.5 MG tablet Take 0.5 mg by mouth 3 (three) times daily.    Yes Historical Provider, MD  butalbital-acetaminophen-caffeine (FIORICET, ESGIC) 50-325-40 MG per tablet Take 1-2 tablets by mouth every 6 (six) hours as needed for headache or migraine.   Yes Historical Provider, MD  gabapentin (NEURONTIN) 300 MG capsule Take 300 mg by mouth 3 (three) times daily.   Yes Historical Provider, MD  ibuprofen (ADVIL,MOTRIN) 800 MG tablet Take 800 mg by mouth every 8 (eight) hours as needed for moderate pain.   Yes Historical Provider, MD  clonazePAM (KLONOPIN) 1 MG tablet Take 1 tablet (1 mg total) by mouth at bedtime. Patient not taking: Reported on 05/20/2015 12/01/14   Ripudeep Jenna Luo, MD  guaiFENesin-dextromethorphan (ROBITUSSIN DM) 100-10 MG/5ML syrup Take 5 mLs by mouth every 4 (four) hours as needed for cough. Patient not taking: Reported on 01/01/2015 12/01/14   Ripudeep Jenna Luo, MD  naproxen sodium (  ANAPROX) 220 MG tablet Take 660 mg by mouth daily as needed (pain).    Historical Provider, MD  polyethylene glycol (MIRALAX / GLYCOLAX) packet Take 17 g by mouth daily as needed for moderate constipation. Patient not taking: Reported on 05/20/2015 12/01/14   Ripudeep Jenna LuoK Rai, MD  promethazine (PHENERGAN) 25 MG tablet Take 1 tablet (25 mg total) by mouth every 6 (six) hours as needed for nausea. 06/06/15   Benjiman CoreNathan Cris Gibby, MD  senna (SENOKOT) 8.6 MG TABS tablet Take  2 tablets (17.2 mg total) by mouth at bedtime. For constipation Patient not taking: Reported on 06/06/2015 12/01/14   Ripudeep K Rai, MD   BP 125/87 mmHg  Pulse 77  Temp(Src) 98.5 F (36.9 C) (Oral)  Resp 15  SpO2 100%  LMP 05/16/2012 Physical Exam  Constitutional: She appears well-developed and well-nourished.  HENT:  Head: Atraumatic.  Eyes: EOM are normal. Pupils are equal, round, and reactive to light.  Neck: Normal range of motion. Neck supple.  No meningeal signs. No flexion of neck with raising of the legs.  Cardiovascular: Normal rate.   Pulmonary/Chest: Effort normal.  Abdominal: Soft. There is no tenderness.  Slight mid abdominal tenderness without rebound or guarding.  Musculoskeletal: Normal range of motion.  Neurological: She is alert.  Skin: Skin is warm.    ED Course  Procedures (including critical care time) Labs Review Labs Reviewed  COMPREHENSIVE METABOLIC PANEL - Abnormal; Notable for the following:    Potassium 3.2 (*)    Chloride 112 (*)    CO2 19 (*)    AST 14 (*)    ALT 12 (*)    All other components within normal limits  LIPASE, BLOOD - Abnormal; Notable for the following:    Lipase 13 (*)    All other components within normal limits  CBC WITH DIFFERENTIAL/PLATELET    Imaging Review No results found.   EKG Interpretation None      MDM   Final diagnoses:  Nonintractable headache, unspecified chronicity pattern, unspecified headache type   patient with headache. Has been vomiting. Feels much better after treatment. May be viral cause feels better after fluids and doubt meningitis or intracranial hemorrhage at this time. Does have history of migraine. Will discharge home.    Benjiman CoreNathan Muneer Leider, MD 06/07/15 [phone]

## 2015-06-06 NOTE — ED Notes (Signed)
Patient with no complaints at this time. Respirations even and unlabored. Skin warm/dry. Discharge instructions reviewed with patient at this time. Patient given opportunity to voice concerns/ask questions. IV removed per policy and band-aid applied to site. Patient discharged at this time and left Emergency Department with steady gait.  

## 2015-07-09 ENCOUNTER — Telehealth (HOSPITAL_COMMUNITY): Payer: Self-pay | Admitting: *Deleted

## 2015-07-11 ENCOUNTER — Telehealth (HOSPITAL_COMMUNITY): Payer: Self-pay | Admitting: *Deleted

## 2016-02-01 IMAGING — DX DG ABDOMEN ACUTE W/ 1V CHEST
4 series · 4 of 4 positions shown · non-contrast
Comparison: None.

CLINICAL DATA: Sharp mid abdominal pain, left lower quadrant pain
for 4 days. Chills.

EXAM:
ACUTE ABDOMEN SERIES (ABDOMEN 2 VIEW & CHEST 1 VIEW)

[chest pa]
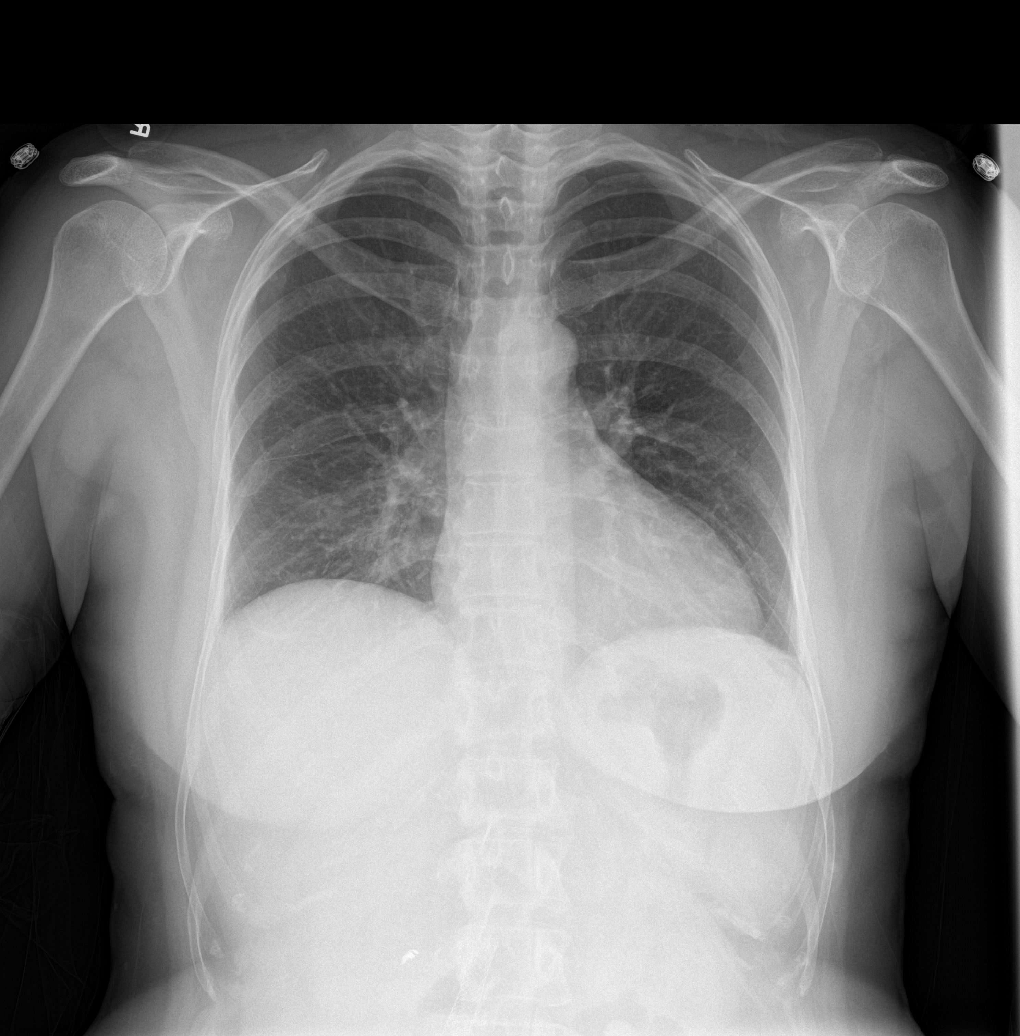

[abdomen erect (1 of 2)]
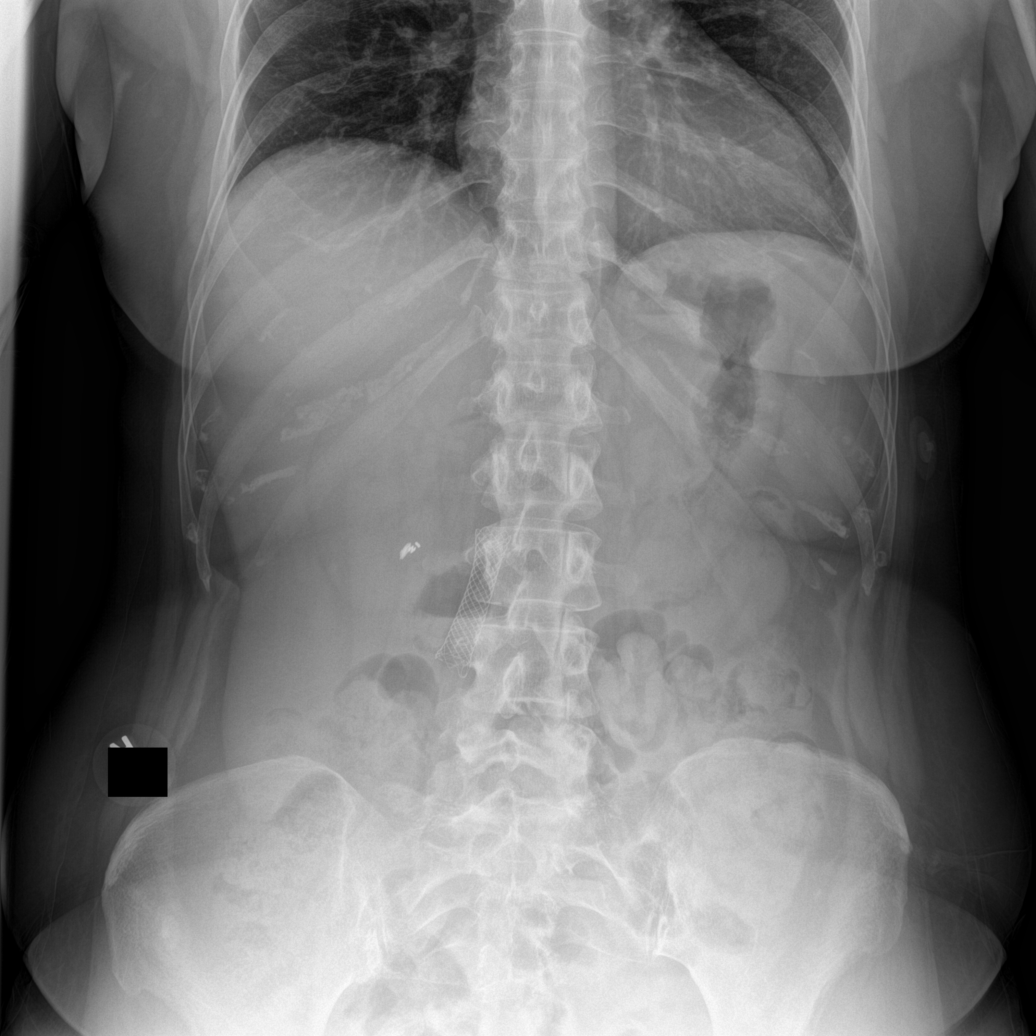

[abdomen erect (2 of 2)]
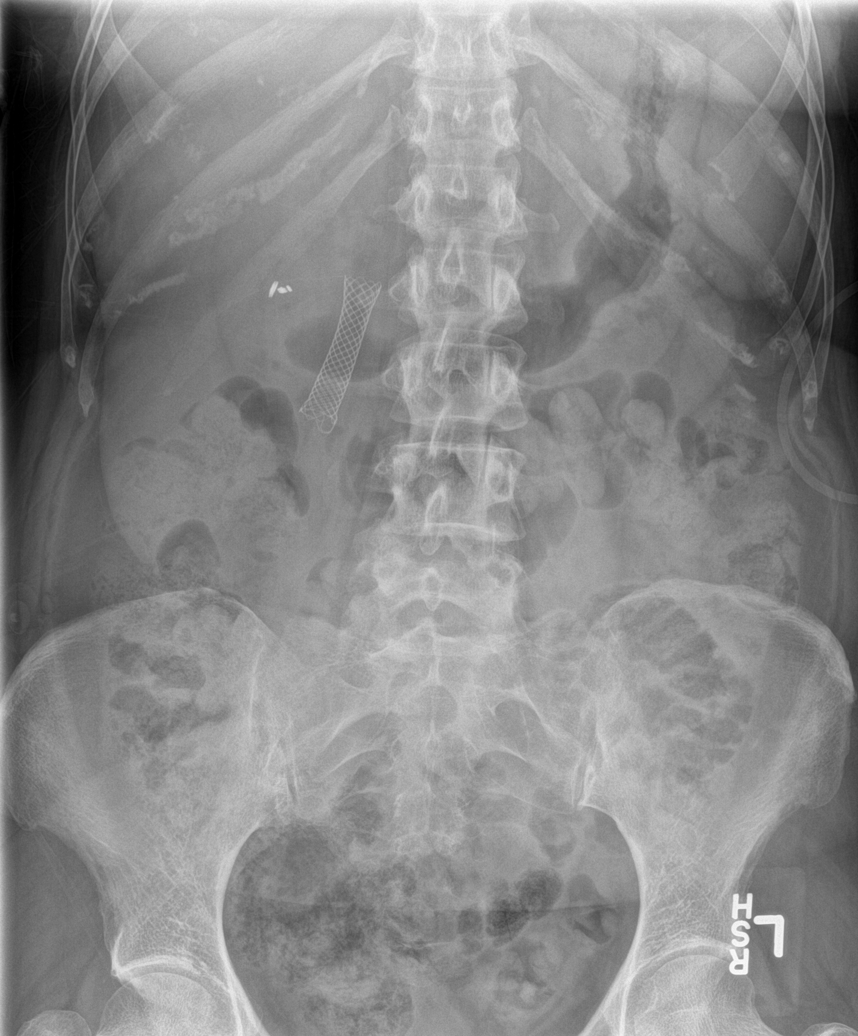

[abdomen supine]
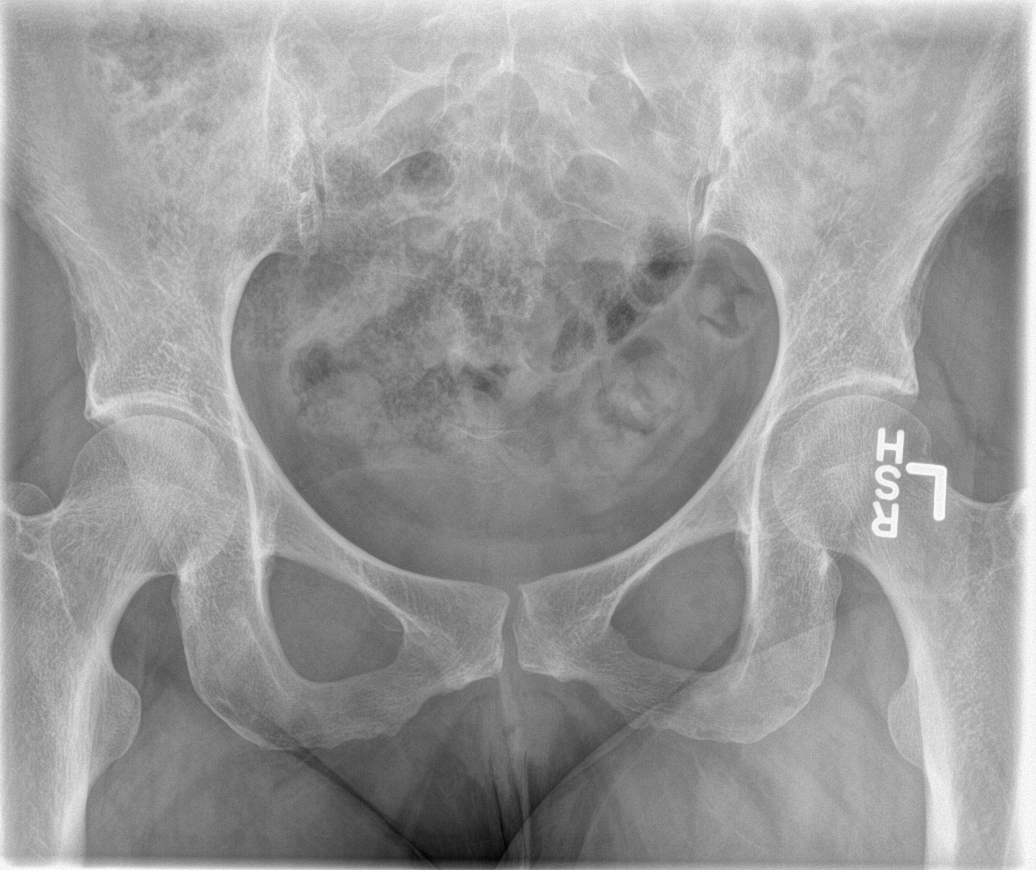

[4 of 4 positions shown; findings below may reference images not displayed]

FINDINGS: Prior cholecystectomy. Metallic stent is seen to the right of
midline, likely biliary stent. Nonobstructive bowel gas pattern. No
free air. No suspicious calcification. No acute bony abnormality.

Heart and mediastinal contours are within normal limits. No focal
opacities or effusions. No acute bony abnormality.
IMPRESSION: No evidence of bowel obstruction or free air.  No acute findings.

## 2016-02-12 ENCOUNTER — Encounter: Payer: Self-pay | Admitting: Neurology

## 2016-02-12 ENCOUNTER — Ambulatory Visit (INDEPENDENT_AMBULATORY_CARE_PROVIDER_SITE_OTHER): Payer: Managed Care, Other (non HMO) | Admitting: Neurology

## 2016-02-12 DIAGNOSIS — N39498 Other specified urinary incontinence: Secondary | ICD-10-CM

## 2016-02-12 DIAGNOSIS — Z87828 Personal history of other (healed) physical injury and trauma: Secondary | ICD-10-CM

## 2016-02-12 DIAGNOSIS — R202 Paresthesia of skin: Secondary | ICD-10-CM | POA: Diagnosis not present

## 2016-02-12 DIAGNOSIS — H539 Unspecified visual disturbance: Secondary | ICD-10-CM | POA: Diagnosis not present

## 2016-02-12 DIAGNOSIS — M542 Cervicalgia: Secondary | ICD-10-CM | POA: Diagnosis not present

## 2016-02-12 DIAGNOSIS — R32 Unspecified urinary incontinence: Secondary | ICD-10-CM | POA: Insufficient documentation

## 2016-02-12 MED ORDER — AMITRIPTYLINE HCL 25 MG PO TABS: 50.0000 mg | ORAL_TABLET | Freq: Every day | ORAL | Status: DC

## 2016-02-12 MED ORDER — DICLOFENAC SODIUM 1 % TD GEL: 2.0000 g | Freq: Four times a day (QID) | TRANSDERMAL | Status: DC

## 2016-02-12 NOTE — Progress Notes (Signed)
[facility]    Provider:  Dr Lucia Gaskins Referring Provider: Richardean Chimera, MD Primary Care Physician:  Donzetta Sprung, MD  CC:  Multiple symptoms after a fall  HPI:  Ann [name] is a 50 y.o. female here as a referral from Dr. Reuel Boom for multiple symptoms after a fall. She fell at work, tripped over a box September 22nd and she fell and head did a whiplash motion, she was confused, took a minute to stand up, didn't know what happened, no vomiting, some nausea. She has terrible neck pain since then. She is having bad short term memory since then, sometimes she gets lost driving or doesn't know where she is, she loses words and train of thought in the middle of conversations and this never happened before. She has had migraines all her life but this is different. She has a frontal headache, won't go away. She wakes up with the headaches somedays, no rhyme or reason just daily at any time can get the headache for a month now. She is having vision changes, hearing changes, speech changes and starting to stutter and she is having bed wetting. Incontinence. She has to wear diapers to bed. She is having blurred vision, swelling burning itching pain, vertigo, nausea, hot spells, burning skin and burning spots, short-term memory loss, getting lost, weight gain, stuttering, hearing loss and severe neck pain. She had an MRI of the brain, ENT evaluation with + Gilberto Better and response to [employer]. Never had any of the these symptoms before the fall. She is having concentration problems, she is irritable, depression and anxiety worse than normal, she has terrible insomnia which is not new. Insomnia is worse, mind races at night.Headaches are daily but not migrainous. Last migraine 3 weeks ago. She wakes up with headaches every day. She wakes herself up snoring. She has not been to physical therapy for the neck pain. More short-term memory problems. Robaxin didn't help. Has not gone to PT for  her neck. No weakness but has burning spots throughout her body. She goes to bed 9ish and can only fall asleep with xanax and sleeps until 5am. She has frequent awakenings and mind races.  Reviewed notes, labs and imaging from outside physicians, which showed: MRI of the brain done November 2016 shows normal for age noncontrast MRI appearance of the brain. Per report. Was completed at [facility] MRI of the brain images on CD and agree with findings. Labs completed 01/16/2016 showed normal CBC, CMP showed elevated ALT 44 otherwise normal. Cholesterol 265 with triglycerides of 548. Hemoglobin A1c 5.1  Reviewed notes from [facility] in audiology.Diagnosed with BPPV. Dix hallpike + on the right. +response to Epley maneuver. Hearing unremarkable.   Review of Systems: Patient complains of symptoms per HPI as well as the following symptoms: fevers/chills, weight gain, fatigue, blurred vision, eye pain, CP, SOB, snoring, feeling hot, flushing, hearing loss, ringing in ears, incontinence, diarrhea, joint pain, joint swelling, cramps, aching muscles, memory loss, confusion, headaches, slurred speech, dizziness, insomnia, snoring, restless legs, depression, anxiety, not enough sleep, decreased energy,. Pertinent negatives per HPI. All others negative.   Social History   Social History  . Marital Status: Married    Spouse Name: Onalee Hua  . Number of Children: 2  . Years of Education: 11   Occupational History  . Workers comp    Social History Main Topics  . Smoking status: Former Games developer  . Smokeless tobacco: Not on file  .  Alcohol Use: No  . Drug Use: No  . Sexual Activity: Not on file   Other Topics Concern  . Not on file   Social History Narrative   Lives w/ husband   Caffeine use:  none    Family History  Problem Relation Age of Onset  . Heart attack    . Migraines Neg Hx   . Seizures Neg Hx   . Dementia Neg Hx     Past Medical History    Diagnosis Date  . Chronic back pain   . Pancreatitis   . Arthritis   . Migraines     Past Surgical History  Procedure Laterality Date  . Bile duct stent placement    . Cholecystectomy      Current Outpatient Prescriptions  Medication Sig Dispense Refill  . ALPRAZolam (XANAX) 0.5 MG tablet Take 0.5 mg by mouth daily.     . cholestyramine (QUESTRAN) 4 g packet Take 4 g by mouth 3 (three) times daily with meals.    . gabapentin (NEURONTIN) 300 MG capsule Take 300 mg by mouth 3 (three) times daily as needed.     . meclizine (ANTIVERT) 25 MG tablet Take 25 mg by mouth 3 (three) times daily as needed for dizziness.    Marland Kitchen amitriptyline (ELAVIL) 25 MG tablet Take 2 tablets (50 mg total) by mouth at bedtime. 60 tablet 6  . diclofenac sodium (VOLTAREN) 1 % GEL Apply 2 g topically 4 (four) times daily. Apply to the muscles of the neck. 100 g 4   No current facility-administered medications for this visit.    Allergies as of 02/12/2016 - Review Complete 02/12/2016  Allergen Reaction Noted  . Imitrex [sumatriptan] Anaphylaxis and Rash 06/15/2012  . Ciprofloxacin  02/12/2016  . Cymbalta [duloxetine hcl]  02/12/2016  . Doxycycline Diarrhea 06/15/2012  . Lyrica [pregabalin] Swelling and Other (See Comments) 06/15/2012  . Nucynta [tapentadol]  05/20/2015  . Nuvigil [armodafinil]  02/12/2016  . Other  06/06/2015  . Penicillins  02/12/2016  . Amoxicillin-pot clavulanate Diarrhea and Rash 06/15/2012  . Butrans [buprenorphine] Hives, Swelling, and Rash 06/15/2012    Vitals: LMP 05/16/2012 Last Weight:  Wt Readings from Last 1 Encounters:  05/20/15 153 lb (69.4 kg)   Last Height:   Ht Readings from Last 1 Encounters:  05/20/15  (1.676 m)    Physical exam: Exam: Gen: NAD, conversant, well nourised, obese, well groomed                     CV: RRR, no MRG. No Carotid Bruits. No peripheral edema, warm, nontender Eyes: Conjunctivae clear without exudates or  hemorrhage  Neuro: Detailed Neurologic Exam  Speech:    Speech is normal; fluent and spontaneous with normal comprehension.  Cognition:    The patient is oriented to person, place, and time;     recent and remote memory intact;     language fluent;     normal attention, concentration,     fund of knowledge Cranial Nerves:    The pupils are equal, round, and reactive to light. The fundi are normal and spontaneous venous pulsations are present. Small left cataract. Visual fields are full to finger confrontation. Extraocular movements are intact. Trigeminal sensation is intact and the muscles of mastication are normal. The face is symmetric. The palate elevates in the midline. Hearing intact. Voice is normal. Shoulder shrug is normal. The tongue has normal motion without fasciculations.   Coordination:    Normal finger  to nose and heel to shin. Normal rapid alternating movements.   Gait:    Heel-toe and tandem gait are normal.   Motor Observation:    No asymmetry, no atrophy, and no involuntary movements noted. Tone:    Normal muscle tone.    Posture:    Posture is normal. normal erect    Strength:    Strength is V/V in the upper and lower limbs with giveway      Sensation: intact to LT     Reflex Exam:  DTR's: Absent AJs otherwise deep tendon reflexes in the upper and lower extremities are normal bilaterally.   Toes:    The toes are downgoing bilaterally.   Clonus:    Clonus is absent.      Assessment/Plan: 50 year old female with a multitude of complaints after falling over a box and straining her neck. No head trauma. Would be highly unlikely to have post-concussive syndrome and all of these complaints from a fall over a box while walking without any head trauma.  Neuro exam is non focal.   - Cervico genic headache  -Neurocognitive testing: Suspect mood disorder.  - Neck pain, paresthesias and urinary incontinence: mri cervical spine. Physical therapy.Topical  Voltaren gel.   -Cervicogenic Headaches and insomnia: Amitriptyline will titrate to 50mg . Will also help with neck pain. Don't take Xanax with the Amitriptyline. Discussed side effects including teratogenicity, do not Pregnant on this medication and use birth control. Serious side effects can include hypotension, hypertension, syncope, ventricular arrhythmias, QT prolongation and other cardiac side effects, stroke and seizures, ataxia tardive dyskinesias, extrapyramidal symptoms, increased intraocular pressure, leukopenia, thrombocytopenia, hallucinations, suicidality and other serious side effects. Common reactions include drowsiness, dry mouth, dizziness, constipation, blurred vision, palpitations, tachycardia, impaired coordination, increased appetite, nausea vomiting, weakness, confusion, disorientation, restlessness, anxiety and other side effects.  - Recommend psychiatry and therapy  Cc: Dr. Thera Flakeaniel   Johnmark Geiger, MD  [facility] [address] [place] [id number]  Phone [phone] Fax [phone]

## 2016-02-12 NOTE — Patient Instructions (Signed)
Remember to drink plenty of fluid, eat healthy meals and do not skip any meals. Try to eat protein with a every meal and eat a healthy snack such as fruit or nuts in between meals. Try to keep a regular sleep-wake schedule and try to exercise daily, particularly in the form of walking, 20-30 minutes a day, if you can.   As far as your medications are concerned, I would like to suggest:  Amitriptyline: Week one 1/2 tab, week 2 a whole tab, week 3 take 1.5 tabs and week 4 take 2 tabs at bedtime.   As far as diagnostic testing: Neurocognitive testing, MRI cervical spine  I would like to see you back in 3 months, sooner if we need to. Please call us with any interim questions, concerns, problems, updates or refill requests.   Our phone number is [phone]. We also have an after hours call service for urgent matters and there is a physician on-call for urgent questions. For any emergencies you know to call 911 or go to the nearest emergency room

## 2016-02-14 ENCOUNTER — Encounter: Payer: Self-pay | Admitting: Neurology

## 2016-02-27 ENCOUNTER — Ambulatory Visit (INDEPENDENT_AMBULATORY_CARE_PROVIDER_SITE_OTHER): Payer: Managed Care, Other (non HMO)

## 2016-02-27 ENCOUNTER — Other Ambulatory Visit: Payer: Managed Care, Other (non HMO)

## 2016-02-27 DIAGNOSIS — Z87828 Personal history of other (healed) physical injury and trauma: Secondary | ICD-10-CM | POA: Diagnosis not present

## 2016-02-27 DIAGNOSIS — N39498 Other specified urinary incontinence: Secondary | ICD-10-CM

## 2016-02-27 DIAGNOSIS — M542 Cervicalgia: Secondary | ICD-10-CM

## 2016-02-27 DIAGNOSIS — R202 Paresthesia of skin: Secondary | ICD-10-CM

## 2016-02-28 ENCOUNTER — Encounter: Payer: Self-pay | Admitting: Neurology

## 2016-03-03 ENCOUNTER — Telehealth: Payer: Self-pay | Admitting: *Deleted

## 2016-03-03 NOTE — Telephone Encounter (Signed)
-----   Message from Anson FretAntonia B Ahern, MD sent at 03/02/2016  1:51 PM EDT ----- MRI of the cervical spine is unremarkable thanks. No spinal stenosis or foraminal narrowing. Looks good thanks

## 2016-03-03 NOTE — Telephone Encounter (Signed)
LVM for pt to call about results. Gave [facility] phone number.  

## 2016-03-07 ENCOUNTER — Encounter: Payer: Self-pay | Admitting: Neurology

## 2016-03-08 ENCOUNTER — Telehealth: Payer: Self-pay | Admitting: Neurology

## 2016-03-08 ENCOUNTER — Other Ambulatory Visit: Payer: Self-pay | Admitting: Neurology

## 2016-03-08 DIAGNOSIS — M542 Cervicalgia: Secondary | ICD-10-CM

## 2016-03-08 DIAGNOSIS — R413 Other amnesia: Secondary | ICD-10-CM

## 2016-03-08 NOTE — Telephone Encounter (Signed)
Ann [name], can you please get this patient's referral for neurocognitive testing to [facility] asap please? Thank you.

## 2016-03-10 NOTE — Telephone Encounter (Signed)
Noted Done this has been sent as ASAP referral .

## 2016-03-11 ENCOUNTER — Telehealth: Payer: Self-pay | Admitting: Neurology

## 2016-03-11 NOTE — Telephone Encounter (Signed)
Per incoming fax Dr. Jacquelyne BalintMcDermott office called to schedule patient . Patient has declined apt for income reason's.

## 2016-03-19 NOTE — Telephone Encounter (Signed)
Left message for a return call

## 2016-04-03 ENCOUNTER — Encounter: Payer: Self-pay | Admitting: *Deleted

## 2016-04-03 NOTE — Telephone Encounter (Signed)
Sent patient letter RE MRI results since we have received no return call. Mailed today.

## 2016-04-07 ENCOUNTER — Encounter: Payer: Self-pay | Admitting: Neurology

## 2016-05-09 ENCOUNTER — Telehealth: Payer: Self-pay | Admitting: *Deleted

## 2016-05-09 NOTE — Telephone Encounter (Signed)
[facility]: [facility]                  Taken  2-JUN-17 at  8:06AM by RMS ------------------------------------------------------------ [facility]ES[facility]             CID [id number]  Patient SAME                 [facility]        Area Code 336 Phone# [phone] * DOB [phone]    RE CANCELLING 6/2 @UNKNOWN  TIME-WCB TO RESCHEDULE                                                         Disp:Y/N N If Y = C/B If No Response In 20minutes ============================================================

## 2016-05-14 ENCOUNTER — Ambulatory Visit: Payer: Managed Care, Other (non HMO) | Admitting: Neurology

## 2016-05-27 ENCOUNTER — Ambulatory Visit: Payer: Managed Care, Other (non HMO) | Attending: Neurology | Admitting: Physical Therapy

## 2016-05-27 DIAGNOSIS — M542 Cervicalgia: Secondary | ICD-10-CM | POA: Insufficient documentation

## 2016-05-27 DIAGNOSIS — R29898 Other symptoms and signs involving the musculoskeletal system: Secondary | ICD-10-CM | POA: Diagnosis not present

## 2016-05-27 NOTE — Therapy (Signed)
[facility] [address] [place], [place], [address] Phone: [phone]   Fax:  [phone]  Physical Therapy Evaluation  Patient Details  Name: Ann [name] MRN: [id number] Date of Birth: 05/27/1966 Referring Provider: Naomie Dean MD  Encounter Date: 05/27/2016      PT End of Session - 05/27/16 1704    Visit Number 1   Number of Visits 10   Date for PT Re-Evaluation 07/25/16   Authorization Type [employer]   PT Start Time 0848   PT Stop Time 0932   PT Time Calculation (min) 44 min   Activity Tolerance Patient tolerated treatment well      Past Medical History  Diagnosis Date  . Chronic back pain   . Pancreatitis   . Arthritis   . Migraines     Past Surgical History  Procedure Laterality Date  . Bile duct stent placement    . Cholecystectomy      There were no vitals filed for this visit.       Subjective Assessment - 05/27/16 0853    Subjective The pt is a 50 yr old female referred for neck pain. The pt states that the pain began after tripping over a box at work Sept 22, 2016. Inital Pt referral 03/08/2016, pt states due to no income since accident she has been unable to come for physical therapy. After fall the patient underwent physical therapy for vertigo, where they performed "Hallpike thing and Epley Manuever". However the pt believes that her vertigo has returned as the room spins whenever she looks up and to the R. She reports that an MRI was done initally after the fall and looked good, but does not understand how the MRI could look good and she could still be in so much pain.     Pertinent History Chronic back pain, pancreatitis, arthritis, migranies   Limitations Sitting;House hold activities   Currently in Pain? Yes   Pain Score 3   Worst: 9/10, Best: 0/10   Pain Location Neck   Pain Descriptors / Indicators Constant  something sitting on neck, stiff    Pain Type Chronic pain   Pain  Onset More than a month ago   Pain Frequency Constant   Aggravating Factors  turning head both directions, up/down    Pain Relieving Factors tens unit shoulder blade and neck             [facility] PT Assessment - 05/27/16 0845    Assessment   Medical Diagnosis Musculoskeletal Neck Pain    Referring Provider Naomie Dean MD   Onset Date/Surgical Date 03/08/16  MD office visit and referral   Precautions   Precautions None   Restrictions   Weight Bearing Restrictions No   Balance Screen   Has the patient fallen in the past 6 months No   Has the patient had a decrease in activity level because of a fear of falling?  Yes   Is the patient reluctant to leave their home because of a fear of falling?  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 [employer]   Leisure Watch movies   Observation/Other Assessments   Focus on Therapeutic Outcomes (FOTO)  Functional Status: 45.62  Risk Adjusted: 49   Other Surveys  Other Surveys   Neck Disability Index  66%   Fear Avoidance Belief Questionnaire (FABQ)  66(18)   Posture/Postural Control   Posture/Postural Control Postural limitations   Postural Limitations Rounded Shoulders;Forward head;Increased thoracic kyphosis   Posture Comments Pt demostrates significant forward head and rounded shoulders, with decreased movement of head in any direction   ROM / Strength   AROM / PROM / Strength AROM;Strength   AROM   AROM Assessment Site --   Right/Left Wrist --   Right/Left Finger --   Cervical Flexion 65*   Cervical Extension 56*   Cervical - Right Side Bend 56*   "pulling"  at R Upper trap   Cervical - Left Side Bend 35*  "pulling" L upper , pain increase to 7/10   Cervical - Right Rotation 45*  Pt reports vertigo symptoms   Cervical - Left Rotation 50*  Pt reports pulling, in upper trap   Strength   Overall Strength Comments No pain complaints with any strength testing, reported tinnitis in L ear after L Sidebending maual muscle test   Strength Assessment Site Cervical;Shoulder;Elbow;Hand   Right/Left Shoulder Right;Left   Right Shoulder Flexion 4+/5   Right Shoulder ABduction 4+/5   Left Shoulder Flexion 4+/5   Left Shoulder ABduction 4+/5   Right/Left Elbow Right;Left   Right Elbow Flexion 5/5   Left Elbow Flexion 5/5   Right/Left hand Right;Left   Right Hand Grip (lbs) 16 lbs  Norm for 50yr old female: 5.8 (+/-) 11.6   Left Hand Grip (lbs) 10 lb  Norm for 50yr old female: 18.3 (+/-) 10.7   Cervical Flexion 5/5  Gross strength testing in sitting in neutral    Cervical Extension 5/5  Gross strength testing in sitting in neutral    Cervical - Right Side Bend 5/5  Gross strength testing in sitting in neutral    Cervical - Left Side Bend 5/5  Gross strength testing in sitting in neutral    Cervical - Right Rotation 5/5  Gross strength testing in sitting in neutral    Cervical - Left Rotation 5/5  Gross strength testing in sitting in neutral    Palpation   Palpation comment Palpation of left upper trap revealed trigger points and noted tighness that increased the patients pain when palpated, tenderness with palpation of spinous process at C7 and T1   Ambulation/Gait   Ambulation/Gait Yes   Ambulation/Gait Assistance 7: Independent   Ambulation Distance (Feet) 40 Feet   Assistive device None   Ambulation Surface Level;Indoor   Gait velocity 3.29 ft/s  Community ambulator speed >2.62 ft/s            Vestibular Assessment - 05/27/16 0845    Symptom Behavior   Type of Dizziness Spinning   Frequency of Dizziness everyday for last week    Duration of Dizziness 10s   Aggravating Factors Looking up to the ceiling;Turning head quickly   Relieving Factors Head stationary   Positional Testing   Dix-Hallpike Dix-Hallpike Right;Dix-Hallpike Left   Dix-Hallpike Right   Dix-Hallpike Right Symptoms No nystagmus  Pt reported dizziness with supine to sit   Dix-Hallpike Left   Dix-Hallpike Left Symptoms No nystagmus  Pt reported dizziness with supine to sit  PT Education - 05/27/16 1700    Education provided Yes   Education Details PT POC and how stiffness occurs when we limit movement   Person(s) Educated Patient   Methods Explanation   Comprehension Verbalized understanding          PT Short Term Goals - 05/27/16 1739    PT SHORT TERM GOAL #1   Title Pt will be supervision with HEP for cervical strengthening and flexibility needing </= to 2 cues by therapist (Target Date: 06/27/16)   Time 4   Period Weeks   Status New   PT SHORT TERM GOAL #2   Title Pt will increase cervical AROM by >/= to 5% in all planes (Target Date: 06/27/16)   Time 4   Period Weeks   Status New   PT SHORT TERM GOAL #3   Title Pt will be able to perform activities during therapy session with no pain increase >/= 5/10 increments on pain scale (Target Date: 06/27/16)   Time 4   Period Weeks   Status New   PT SHORT TERM GOAL #4   Title Pt will have negative testing wtih positional testing for BPPV (Target Date: 06/27/16)   Time 4   Period Weeks   Status New           PT Long Term Goals - 05/27/16 1734    PT LONG TERM GOAL #1   Title Pt will increase cervical AROM by >/= 10%  in all planes to indicate increased functional use of head/neck for functions in the home(Target 07/25/2016)   Time 6   Status New   PT LONG TERM GOAL #2   Title Pt will improve Neck Disabiltiy Index Score by >/=10%   to indicate improved perception of disabilty  (Target 07/25/2016)   Time 6   Status New   PT LONG TERM GOAL #3    Title Pt will be able to perform activites in therapy with no pain increase >/= to 2 increments on pain scale to indicate improved function without pain (Target 07/25/2016)   Time 6   Period Weeks   Status New   PT LONG TERM GOAL #4   Title Pt will be independent with ongoing HEP to enable maximizes gains made in therapy (Target Date: 07/25/2016)   Time 8   Period Weeks               Plan - 05/27/16 1716    Clinical Impression Statement Pt is a 50 yr old female referred for neck pain after fall at work in September 2016. PMH: Chronic back pain, pancreatitis, arthritis, migranies. Patient demostrates forward head and rounded shoulder posturing upon intital eval. The patients cerivcal AROM is limited in all planes, with pain complaints in upper traps with sidebending and rotation to R and L. Upper traps were tender to palpation and increased the patients pain.  Since her fall in September the pt has limited her head movement due to vertigo and pain, causing increased stiffness in cerivcal spine, muscle tightness and decreased cervical AROM. These above impairments have increased pts pain and limited her ability to funciton in the home and communtity. This patient due to her multifactoral past medical history, multiple system involvement, complaints of vertigo and insidious onset of neck pain requires moderate clinican skill level. Skilled PT is needed to address listed impairments and improve fucnitonal mobilty.    Rehab Potential Good   PT Frequency --  2x a week for 2 weeks, 1x  a week for 6 weeks   PT Treatment/Interventions ADLs/Self Care Home Management;Canalith Repostioning;Therapeutic exercise;Therapeutic activities;Manual techniques;Vestibular;Functional mobility training;Stair training;Gait training;Balance training;Neuromuscular re-education;Passive range of motion;Patient/family education      Patient will benefit from skilled therapeutic intervention in order to improve the  following deficits and impairments:  Postural dysfunction, Decreased mobility, Hypomobility, Impaired flexibility, Dizziness, Pain, Decreased range of motion  Visit Diagnosis: Other symptoms and signs involving the musculoskeletal system  Cervicalgia     Problem List Patient Active Problem List   Diagnosis Date Noted  . Neck pain 02/12/2016  . Paresthesias 02/12/2016  . Vision changes 02/12/2016  . History of cervical spine trauma 02/12/2016  . Urinary incontinence 02/12/2016  . Acute pancreatitis 11/24/2014  . Chronic abdominal pain 11/24/2014    Rollene Fare, SPT 05/28/2016, 12:22 PM  [facility] [address] [place], [place], [address] Phone: [phone]   Fax:  [phone]  Name: LAJEANA STROUGH MRN: [id number] Date of Birth: 11-Aug-1966   Vladimir Faster, PT, DPT PT Specializing in Prosthetics & Orthotics 05/28/2016 1:30 PM Phone:  [phone]  Fax:  [phone] [facility] [address] [place], [place] [address]

## 2016-05-29 ENCOUNTER — Ambulatory Visit: Payer: Managed Care, Other (non HMO) | Admitting: Physical Therapy

## 2016-06-02 ENCOUNTER — Ambulatory Visit: Payer: Managed Care, Other (non HMO) | Admitting: Physical Therapy

## 2016-06-05 ENCOUNTER — Ambulatory Visit: Payer: Self-pay | Admitting: Physical Therapy

## 2016-06-18 ENCOUNTER — Ambulatory Visit: Payer: Self-pay | Admitting: Physical Therapy

## 2016-06-25 ENCOUNTER — Ambulatory Visit: Payer: Self-pay | Admitting: Physical Therapy

## 2016-07-02 ENCOUNTER — Ambulatory Visit: Payer: Self-pay | Admitting: Physical Therapy

## 2016-07-09 ENCOUNTER — Ambulatory Visit: Payer: Self-pay | Admitting: Physical Therapy

## 2016-07-16 ENCOUNTER — Ambulatory Visit: Payer: Self-pay | Admitting: Physical Therapy

## 2016-07-23 ENCOUNTER — Ambulatory Visit: Payer: Self-pay | Admitting: Physical Therapy

## 2016-10-13 ENCOUNTER — Telehealth (HOSPITAL_BASED_OUTPATIENT_CLINIC_OR_DEPARTMENT_OTHER): Payer: Self-pay | Admitting: Family Medicine

## 2016-10-13 ENCOUNTER — Other Ambulatory Visit (HOSPITAL_BASED_OUTPATIENT_CLINIC_OR_DEPARTMENT_OTHER): Payer: Self-pay | Admitting: Family Medicine

## 2016-10-14 ENCOUNTER — Telehealth (HOSPITAL_BASED_OUTPATIENT_CLINIC_OR_DEPARTMENT_OTHER): Payer: Self-pay | Admitting: Family Medicine

## 2016-10-14 DIAGNOSIS — J449 Chronic obstructive pulmonary disease, unspecified: Secondary | ICD-10-CM

## 2016-10-14 NOTE — Telephone Encounter (Signed)
-----   Message from [facility] [place] sent at 10/13/2016  4:16 PM EST -----  Regarding: order for PFT  Would you please place an order for this patient to have a pulmonary function test.  She needs this before having weight loss surgery with Dr. Pearlie Oyster.

## 2016-10-15 ENCOUNTER — Other Ambulatory Visit (HOSPITAL_BASED_OUTPATIENT_CLINIC_OR_DEPARTMENT_OTHER): Payer: Self-pay | Admitting: Family Medicine

## 2016-10-15 DIAGNOSIS — Z01818 Encounter for other preprocedural examination: Secondary | ICD-10-CM

## 2016-10-16 ENCOUNTER — Telehealth (HOSPITAL_BASED_OUTPATIENT_CLINIC_OR_DEPARTMENT_OTHER): Payer: Self-pay | Admitting: Family Medicine

## 2016-10-16 DIAGNOSIS — Z0271 Encounter for disability determination: Secondary | ICD-10-CM

## 2016-10-16 DIAGNOSIS — E669 Obesity, unspecified: Secondary | ICD-10-CM

## 2016-10-16 DIAGNOSIS — Z01818 Encounter for other preprocedural examination: Secondary | ICD-10-CM

## 2016-10-16 DIAGNOSIS — R5383 Other fatigue: Secondary | ICD-10-CM

## 2016-10-16 NOTE — Telephone Encounter (Signed)
Patient needs labs done prior to bariatric surgery with Dr. Pearlie Oyster in [place].

## 2016-10-23 ENCOUNTER — Ambulatory Visit: Payer: Medicare Other | Attending: Family Medicine

## 2016-10-23 DIAGNOSIS — Z01818 Encounter for other preprocedural examination: Principal | ICD-10-CM | POA: Insufficient documentation

## 2016-10-23 DIAGNOSIS — R5383 Other fatigue: Secondary | ICD-10-CM | POA: Insufficient documentation

## 2016-10-23 LAB — COMPREHENSIVE METABOLIC PNL, FASTING
ALBUMIN: 3.9 g/dL (ref 3.6–4.8)
ALKALINE PHOSPHATASE: 109 U/L (ref 38–126)
ALT (SGPT): 29 U/L (ref 3–45)
ANION GAP: 13 mmol/L
AST (SGOT): 22 U/L (ref 7–56)
BILIRUBIN TOTAL: 0.4 mg/dL (ref 0.2–1.3)
BILIRUBIN TOTAL: 0.4 mg/dL (ref 0.2–1.3)
BUN/CREA RATIO: 16
BUN: 16 mg/dL (ref 7–18)
CALCIUM: 9.7 mg/dL (ref 8.5–10.3)
CHLORIDE: 107 mmol/L (ref 101–111)
CO2 TOTAL: 23 mmol/L (ref 22–31)
CREATININE: 1 mg/dL (ref 0.50–1.20)
ESTIMATED GFR: 58 mL/min/{1.73_m2} — ABNORMAL LOW (ref >=60)
GLUCOSE: 167 mg/dL — ABNORMAL HIGH (ref 68–99)
POTASSIUM: 4.6 mmol/L (ref 3.6–5.0)
PROTEIN TOTAL: 6.8 g/dL (ref 6.2–8.0)
SODIUM: 143 mmol/L (ref 137–145)
SODIUM: 143 mmol/L (ref 137–145)

## 2016-10-23 LAB — CBC
HCT: 45 % (ref 37.0–47.0)
HGB: 14.8 g/dL (ref 12.0–16.0)
HGB: 14.8 g/dL (ref 12.0–16.0)
MCH: 29.4 pg (ref 27.0–31.0)
MCHC: 32.9 g/dL — ABNORMAL LOW (ref 33.0–37.0)
MCV: 89.3 fL (ref 80.0–99.0)
PLATELETS: 382 10*3/uL (ref 130–400)
RBC: 5.04 10*6/uL (ref 4.20–5.40)
RDW: 14.4 % (ref 11.5–14.5)
WBC: 11.1 10*3/uL — ABNORMAL HIGH (ref 4.8–10.8)

## 2016-10-23 LAB — VITAMIN D 25 TOTAL: VITAMIN D 25, TOTAL: 27.3 ng/mL — ABNORMAL LOW (ref 30.00–100.00)

## 2016-10-23 LAB — LIPID PANEL
CHOLESTEROL: 167 mg/dL (ref 130–199)
CHOLESTEROL: 167 mg/dL (ref 130–199)
HDL CHOL: 50 mg/dL (ref 40–59)
HDL CHOL: 50 mg/dL (ref 40–59)
LDL CALC: 84 mg/dL (ref 60–129)
TRIGLYCERIDES: 165 mg/dL (ref 30–199)

## 2016-10-23 LAB — VITAMIN B12: VITAMIN B 12: 461 pg/mL (ref 239–931)

## 2016-10-23 LAB — FOLATE: FOLATE: >20 ng/mL

## 2016-10-23 LAB — THYROID STIMULATING HORMONE (SENSITIVE TSH): TSH: 0.763 u[IU]/mL (ref 0.465–4.680)

## 2016-10-23 LAB — THYROXINE, FREE (FREE T4): THYROXINE (T4), FREE: 1.04 ng/dL (ref 0.78–2.19)

## 2016-10-23 LAB — T3 (TRIIODOTHYRONINE), FREE, SERUM: T3 FREE: 2.8 pg/mL (ref 2.8–5.2)

## 2016-10-24 ENCOUNTER — Encounter (HOSPITAL_BASED_OUTPATIENT_CLINIC_OR_DEPARTMENT_OTHER): Payer: Self-pay | Admitting: Family Medicine

## 2016-10-25 ENCOUNTER — Other Ambulatory Visit: Payer: Self-pay

## 2016-10-31 ENCOUNTER — Ambulatory Visit
Admission: RE | Admit: 2016-10-31 | Discharge: 2016-10-31 | Disposition: A | Discharge status: Home / Self Care | Payer: Medicare Other | Source: Ambulatory Visit | Attending: Family Medicine | Admitting: Family Medicine

## 2016-10-31 DIAGNOSIS — J449 Chronic obstructive pulmonary disease, unspecified: Secondary | ICD-10-CM | POA: Insufficient documentation

## 2016-11-01 NOTE — Procedures (Signed)
NAME:  [name], Monique NUMBER:  [id number]  DATE OF SERVICE:  10/31/2016  DOB:  28-Dec-1965  SEX:  F      PRE BRONCHODILATOR SPIROMETRY:  Per ATS criteria.     Pre-FVC is 2.97 which is 79%.   Pre-FEV1 is 2.37 which is 88%.   Pre-FEV1/FVC is 80 which is 99%.    IMPRESSION:  This is a pre bronchodilator spirometry which is normal.  If clinically indicated, please consider ordering full pulmonary function test.        Berlinda Last, MD            DD:  11/01/2016 15:08:06  DT:  11/01/2016 19:12:39 NLA  D#:  [id number]

## 2016-11-19 ENCOUNTER — Ambulatory Visit (INDEPENDENT_AMBULATORY_CARE_PROVIDER_SITE_OTHER): Payer: Medicare Other | Admitting: Family Medicine

## 2016-11-19 ENCOUNTER — Encounter (HOSPITAL_BASED_OUTPATIENT_CLINIC_OR_DEPARTMENT_OTHER): Payer: Self-pay | Admitting: Family Medicine

## 2016-11-19 VITALS — BP 108/71 | HR 68 | Resp 18 | Ht 66.0 in | Wt 282.0 lb

## 2016-11-19 DIAGNOSIS — E119 Type 2 diabetes mellitus without complications: Secondary | ICD-10-CM

## 2016-11-19 DIAGNOSIS — E669 Obesity, unspecified: Principal | ICD-10-CM

## 2016-11-19 NOTE — Progress Notes (Signed)
Patient Name: Monique [name]  Date: 11/19/2016  [facility]  [address]  [place] [address]  [phone]  MRN: [id number]  DOB: 1966/03/22    Chief complaint: Diabetes follow-up    HPI: The patient is a 51 y.o. old female who came in today for     Diabetes; Patient is a 50 year old female who present to the office today for a follow up for diabetes.  Last office visit was 3 months ago.  At that time her A1C was 8.8.  She denies polydipsia, polyuria, polyphagia, weight loss, fatigue, blurred vision, dizziness or sweating.  Patient currently checks her blood glucose 3-4 times daily.  Current treatment includes oral medcation (Metformin, Glimeperide, Glyxambi). Patients states she will be having weight loss surgery soon with Dr. Vernard Gambles in [place].  She is hopeful she will be able to decrease the number of diabetic medcations after surgery.    She continues efforts at weight loss through diet and exercise.     ROS:    General Not Present- Chills, Fatigue, Fever, Tiredness and Weight Loss.  Skin Not Present- Itching, New Lesions and Rash.  HEENT Not Present- Blurred Vision, Ear Pain, Itchy Eyes, Nasal Congestion, Nose Bleed, Sore Throat and Visual Loss.  Respiratory Not Present- Bloody sputum, Cough, Sputum Production and Wheezing.  Cardiovascular Not Present- Chest Pain, Edema, Murmur, Palpitations and Shortness of Breath.  Gastrointestinal Present- Appetite. Not Present- Constipation, Diarrhea, Nausea, Rectal Bleeding and Vomiting.  Female Genitourinary Not Present- Absence of Menstruation, Menstrual Irregularities, Pelvic Pain, Vaginal Bleeding and Vaginal Discharge.  Musculoskeletal Not Present- Joint Pain, Joint Stiffness and Joint Swelling.  Neurological Not Present- Headaches, Loss of Consciousness, Numbness, Seizures, Tingling and Weakness.  Psychiatric Not Present- Anxiety, Depression, Memory Loss and Suicidal Ideation.  Endocrine Present- Diabetes. Not  Present- Excessive Thirst, Polydipsia, Polyuria and Thyroid Problems.    Medications:  Current Outpatient Prescriptions   Medication Sig    ACCU-CHEK AVIVA PLUS TEST STRP Strip 1 Strip by Subcutaneous route Three times a day as needed    Aspirin 81 mg Oral Tablet take 81 mg by mouth Once a day.    clopidogrel (PLAVIX) 75 mg Oral Tablet take 75 mg by mouth Once a day.      furosemide (LASIX) 20 mg Oral Tablet take 1 Tab by mouth Twice daily.    glimepiride (AMARYL) 4 mg Oral Tablet Take 1 Tab by mouth Once a day    GLYXAMBI 10-5 mg Oral Tablet Take 1 Tab by mouth Once a day    HYDROcodone-acetaminophen (NORCO) 10-325 mg Oral Tablet Take 1 Tab by mouth Every 4 hours as needed    lisinopril (PRINIVIL) 2.5 mg Oral Tablet Take 1 Tab by mouth Once a day    metformin (GLUCOPHAGE) 500 mg Oral Tablet take 500 mg by mouth Twice daily with food.    metoprolol (LOPRESSOR) 25 mg Oral Tablet take 12.5 mg by mouth Once per day as needed.      niacin (NIASPAN) 500 mg Oral Tablet Sustained Release take 500 mg by mouth Once a day.    Omeprazole (PRILOSEC) 40 mg Oral Capsule, Delayed Release(E.C.) Take 1 Cap by mouth Once a day    PRENATAL PLUS, CALCIUM CARB, 27 mg iron- 1 mg Oral Tablet Take 1 Tab by mouth Once a day    rosuvastatin (CRESTOR) 20 mg Oral Tablet Take 1 Tab by mouth Once a day     Allergies:  Allergies  Allergen Reactions    No Known Drug Allergies      Past medical history:  Past Medical History:   Diagnosis Date    Coronary artery disease     Diabetes mellitus type II     Ear piercing     GERD (gastroesophageal reflux disease)     HTN     Hyperlipidemia     Hypertension     Neck problem     herniated cervical disc    Obesity     Stroke ([place])     2002    Tattoo     Left breast, Right shoulder    Type 2 diabetes mellitus     Dx 1999 average fasting 70-89     Surgical history:  Past Surgical History:   Procedure Laterality Date    HX ANKLE FRACTURE TX      Right    HX CATARACT REMOVAL      r  and l eyes with implants    HX CESAREAN SECTION      x 3    HX CHOLECYSTECTOMY      HX TONSILLECTOMY       Family history:  Family Medical History     Problem Relation (Age of Onset)    Diabetes Mother    Heart Attack Mother    Hypertension Mother        Social history:  Social History     Social History    Marital status: Married     Spouse name: N/A    Number of children: N/A    Years of education: N/A     Social History Main Topics    Smoking status: Former Smoker     Packs/day: 0.25     Years: 20.00     Types: Cigarettes     Quit date: 03/30/2011    Smokeless tobacco: Never Used    Alcohol use No    Drug use: No    Sexual activity: Not on file     Other Topics Concern    Routine Exercise No    Ability To Walk 2 Flight Of Steps Without Sob/Cp Yes     Social History Narrative     Vitals:  Vitals:    11/19/16 1329   BP: 108/71   Pulse: 68   Resp: 18   SpO2: 96%   Weight: 127.9 kg (282 lb)   Height: 1.676 m (5\' 6" )     Body mass index is 45.52 kg/(m^2).     Physical Examination:    General Characteristics  Overall examination of the patient's skin reveals - no rashes and no suspicious lesions. Color - normal coloration of skin.  Problem #1 Location - General - Note: upper eyelid w/Xythoma-lipid plaques.   Body Habitus:  Obease    Head and Neck  Face Global Assessment - atraumatic.  Eye  Fundi - Bilateral-Normal.  Sclera/Conjunctiva - Bilateral-Normal.  Pupil - Bilateral-Normal, Direct reaction to light normal, Equal and Round.    ENMT  Global Assessment  Examination of related systems reveals - trachea is midline, with no palpable neck masses and no palpable thyroid masses or prominent nodules, no thyromegaly.  Ears External Auditory Canal - Bilateral - no cerumen impaction noted. Otoscopic Exam - Tympanic Membrane - Left - clear. Right - clear. External Auditory Meatus - Bilateral - no cerumen impaction noted.    Chest and Lung Exam  Chest and lung exam reveals -normal excursion with symmetric  chest walls, quiet,  even and easy respiratory effort with no use of accessory muscles and on auscultation, normal breath sounds, no adventitious sounds and normal vocal resonance.  Note: midline chest scar consistent w/ CABG.  Healed. Non-tender. unchanged.     Cardiovascular  Cardiovascular examination reveals -normal heart sounds, regular rate and rhythm with no murmurs, carotid auscultation reveals no bruits and abdominal aorta auscultation reveals no bruits.    Abdomen  Inspection  Contour - Normal.  Palpation/Percussion reveal - Non Tender, No hepatosplenomegaly and No Palpable abdominal masses.  Auscultation  reveals - No Abdominal bruits. Bowel sounds absent - Generalized.    Peripheral Vascular  Upper Extremity Palpation - Radial pulse - Bilateral - Normal.  Lower Extremity Palpation - Edema - Bilateral - No edema.    Neurologic  Mental Status  Affect - normal.  Motor Strength - 5/5 normal muscle strength - Left Lower Extremity and Right Lower Extremity.    Neuropsychiatric  Mental status exam performed with findings of-able to articulate well with normal speech/language, rate, volume and coherence, no evidence of hallucinations, delusions, obsessions or homicidal/suicidal ideation, demonstrates appropriate judgment and insight and attention span and ability to concentrate are normal.    Musculoskeletal  Global Assessment  Examination of related systems reveals - no digital clubbing or cyanosis and neurovascularly intact globally with normal deep tendon reflexes. Gait and Station - normal gait and station.    Lymphatic  General Lymphatics  Description - No Localized lymphadenopathy.    LABS:     Results for orders placed or performed in visit on 10/23/16 (from the past 2217.6 hour(s))   CBC   Result Value Ref Range    WBC 11.1 (H) 4.8 - 10.8 x103/uL    RBC 5.04 4.20 - 5.40 x106/uL    HGB 14.8 12.0 - 16.0 g/dL    HCT 45.0 37.0 - 47.0 %    MCV 89.3 80.0 - 99.0 fL    MCH 29.4 27.0 - 31.0 pg    MCHC 32.9  (L) 33.0 - 37.0 g/dL    RDW 14.4 11.5 - 14.5 %    PLATELETS 382 130 - 400 x103/uL   COMPREHENSIVE METABOLIC PNL, FASTING   Result Value Ref Range    SODIUM 143 137 - 145 mmol/L    POTASSIUM 4.6 3.6 - 5.0 mmol/L    CHLORIDE 107 101 - 111 mmol/L    CO2 TOTAL 23 22 - 31 mmol/L    ANION GAP 13 mmol/L    BUN 16 7 - 18 mg/dL    CREATININE 1.00 0.50 - 1.20 mg/dL    BUN/CREA RATIO 16     ESTIMATED GFR 58 (L) >=60 mL/min/1.55m2    ALBUMIN 3.9 3.6 - 4.8 g/dL    CALCIUM 9.7 8.5 - 10.3 mg/dL    GLUCOSE 167 (H) 68 - 99 mg/dL    ALKALINE PHOSPHATASE 109 38 - 126 U/L    ALT (SGPT) 29 3 - 45 U/L    AST (SGOT) 22 7 - 56 U/L    BILIRUBIN TOTAL 0.4 0.2 - 1.3 mg/dL    PROTEIN TOTAL 6.8 6.2 - 8.0 g/dL   FOLATE   Result Value Ref Range    FOLATE >20.0 ng/mL   LIPID PANEL   Result Value Ref Range    TRIGLYCERIDES 165 30 - 199 mg/dL    CHOLESTEROL 167 130 - 199 mg/dL    HDL CHOL 50 40 - 59 mg/dL    LDL CALC 84 60 - 129 mg/dL  T3 (TRIIODOTHYRONINE), FREE, SERUM   Result Value Ref Range    T3 FREE 2.8 2.8 - 5.2 pg/mL   THYROID STIMULATING HORMONE (SENSITIVE TSH)   Result Value Ref Range    TSH 0.763 0.465 - 4.680 uIU/mL   THYROXINE, FREE (FREE T4)   Result Value Ref Range    THYROXINE (T4), FREE 1.04 0.78 - 2.19 ng/dL   VITAMIN B12   Result Value Ref Range    VITAMIN B 12 461 239 - 931 pg/mL   VITAMIN D 25, TOTAL   Result Value Ref Range    VITAMIN D 25, TOTAL 27.30 (L) 30.00 - 100.00 ng/mL     Visit Diagnosis    Controlled type 2 diabetes mellitus without complication, without long-term current use of insulin (250.00   E11.9)  Impression: Chronic  Controlled.  Continue behavioral efforts, esp  Diabetic diet, < 1800Kcal Q day  Exercise 20 min a day X 6 days a week.  Continue current medications.  Will need yearly foot exam, retinal exam, microalbumin, LDL.  will need A1C q 3 months.  Labs reviewed as above.   She offeres that she will do them today.  Last A1C returns 8.8,  I recommend she use her metformin as ordered.    Hordeolum  externum of right lower eyelid (373.11   H00.012)  Resolved.     Morbid obesity due to excess calories (278.01   E66.01)  Impression: Chronic  Uncontrolled.  We discuss Bariatric surgery in leu of the stimulant.  She has meet w/ Dr. Lianne Moris team in [place].  She will follow through on his recommendations.  Planned procedure for this winter.     Cherly Beach, MD  11/19/2016, 13:51  Electronically signed by Cherly Beach, MD

## 2016-11-20 ENCOUNTER — Other Ambulatory Visit (HOSPITAL_BASED_OUTPATIENT_CLINIC_OR_DEPARTMENT_OTHER): Payer: Self-pay | Admitting: Family Medicine

## 2016-11-20 DIAGNOSIS — M549 Dorsalgia, unspecified: Secondary | ICD-10-CM

## 2016-11-20 MED ORDER — DICLOFENAC 1 % TOPICAL GEL
2.0000 g | Freq: Three times a day (TID) | CUTANEOUS | 1 refills | Status: AC | PRN
Start: 2016-11-20 — End: 2016-12-20

## 2016-12-11 ENCOUNTER — Emergency Department
Admission: EM | Admit: 2016-12-11 | Discharge: 2016-12-11 | Disposition: A | Discharge status: Home / Self Care | Payer: Commercial Managed Care - PPO | Attending: EMERGENCY MEDICINE | Admitting: EMERGENCY MEDICINE

## 2016-12-11 ENCOUNTER — Emergency Department (HOSPITAL_COMMUNITY): Payer: Medicare Other

## 2016-12-11 ENCOUNTER — Encounter (HOSPITAL_COMMUNITY): Payer: Self-pay

## 2016-12-11 ENCOUNTER — Emergency Department (HOSPITAL_COMMUNITY): Payer: Commercial Managed Care - PPO | Admitting: Radiology

## 2016-12-11 DIAGNOSIS — I2510 Atherosclerotic heart disease of native coronary artery without angina pectoris: Secondary | ICD-10-CM | POA: Insufficient documentation

## 2016-12-11 DIAGNOSIS — Z7984 Long term (current) use of oral hypoglycemic drugs: Secondary | ICD-10-CM | POA: Insufficient documentation

## 2016-12-11 DIAGNOSIS — Z8673 Personal history of transient ischemic attack (TIA), and cerebral infarction without residual deficits: Secondary | ICD-10-CM | POA: Insufficient documentation

## 2016-12-11 DIAGNOSIS — E785 Hyperlipidemia, unspecified: Secondary | ICD-10-CM | POA: Insufficient documentation

## 2016-12-11 DIAGNOSIS — Z79899 Other long term (current) drug therapy: Secondary | ICD-10-CM | POA: Insufficient documentation

## 2016-12-11 DIAGNOSIS — K219 Gastro-esophageal reflux disease without esophagitis: Secondary | ICD-10-CM | POA: Insufficient documentation

## 2016-12-11 DIAGNOSIS — Z833 Family history of diabetes mellitus: Secondary | ICD-10-CM | POA: Insufficient documentation

## 2016-12-11 DIAGNOSIS — I10 Essential (primary) hypertension: Secondary | ICD-10-CM | POA: Insufficient documentation

## 2016-12-11 DIAGNOSIS — E119 Type 2 diabetes mellitus without complications: Secondary | ICD-10-CM | POA: Insufficient documentation

## 2016-12-11 DIAGNOSIS — J40 Bronchitis, not specified as acute or chronic: Principal | ICD-10-CM | POA: Insufficient documentation

## 2016-12-11 DIAGNOSIS — Z7902 Long term (current) use of antithrombotics/antiplatelets: Secondary | ICD-10-CM | POA: Insufficient documentation

## 2016-12-11 DIAGNOSIS — Z7982 Long term (current) use of aspirin: Secondary | ICD-10-CM | POA: Insufficient documentation

## 2016-12-11 DIAGNOSIS — Z87891 Personal history of nicotine dependence: Secondary | ICD-10-CM | POA: Insufficient documentation

## 2016-12-11 DIAGNOSIS — Z9049 Acquired absence of other specified parts of digestive tract: Secondary | ICD-10-CM | POA: Insufficient documentation

## 2016-12-11 DIAGNOSIS — Z8249 Family history of ischemic heart disease and other diseases of the circulatory system: Secondary | ICD-10-CM | POA: Insufficient documentation

## 2016-12-11 LAB — RAPID INFLUENZA A/B ANTIGEN
INFLUENZA A ANTIGEN: NEGATIVE
INFLUENZA B ANTIGEN: NEGATIVE

## 2016-12-11 MED ORDER — ACETAMINOPHEN 300 MG-CODEINE 30 MG TABLET
ORAL_TABLET | ORAL | Status: AC
Start: 2016-12-11 — End: 2016-12-11
  Filled 2016-12-11: qty 2

## 2016-12-11 MED ORDER — PROMETHAZINE 6.25 MG/5 ML ORAL SYRUP: 6 mg | mL | Freq: Four times a day (QID) | ORAL | 0 refills | 0 days | Status: DC | PRN

## 2016-12-11 MED ORDER — AZITHROMYCIN 250 MG TABLET: Tab | ORAL | 0 refills | 0 days | Status: DC

## 2016-12-11 MED ORDER — ACETAMINOPHEN 300 MG-CODEINE 30 MG TABLET
2.00 | ORAL_TABLET | ORAL | Status: AC
Start: 2016-12-11 — End: 2016-12-11
  Administered 2016-12-11: 2 via ORAL

## 2016-12-11 MED ORDER — PREDNISONE 20 MG TABLET
40.0000 mg | ORAL_TABLET | Freq: Every day | ORAL | 0 refills | Status: DC
Start: 2016-12-11 — End: 2016-12-16

## 2016-12-11 MED ADMIN — acetaminophen 300 mg-codeine 30 mg tablet: ORAL | @ 17:00:00

## 2016-12-11 NOTE — ED Provider Notes (Signed)
Emergency Department  Provider Note  HPI - 12/11/2016    Name: Monique [name]  Age and Gender: 51 y.o. female  Attending: Dr. Maida Sale  APP: Georganna Skeans PA-C      HPI:  Monique [name] is a 51 y.o. female  who presents to the ED today for flu like symptoms. Pt states that yesterday, she began to have generalized aches, BL ear aches, hot and cold chills, a cough, and has been sleeping all day. Pain is generalized and exacerbated by movement. No other symptoms are reported at this time.      Location: Generalized  Quality: Ache  Onset: Yesterday  Severity: Generalized pain  Timing: Still present  Modifying factors: Exacerbated by movement  Associated symptoms: +Generalized aches +BL ear ache +Cough +Hot and cold chills +Fatigue    History provided by: Patient    Review of Systems:    Constitutional: +Hot and cold chills. +Fatigue. No fever.  Skin: No rashes or lesions  HENT: +BL ear ache. No sore throat, ear pain, or difficulty swallowing  Eyes: No vision changes, redness, discharge  Cardio: No chest pain, palpitations   Respiratory: +Cough. No wheezing or SOB  GI:  No nausea or vomiting. No diarrhea or constipation. No abdominal pain  GU:  No dysuria, hematuria, polyuria  MSK: +Generalized aches. No joint pain.  No neck or back pain  Neuro: No numbness, tingling, or weakness.  No headache  All other systems reviewed and are negative, unless commented on in the HPI.       Below information reviewed with patient:   Current Outpatient Prescriptions   Medication Sig   . ACCU-CHEK AVIVA PLUS TEST STRP Strip 1 Strip by Subcutaneous route Three times a day as needed   . Aspirin 81 mg Oral Tablet take 81 mg by mouth Once a day.   Marland Kitchen azithromycin (ZITHROMAX) 250 mg Oral Tablet Take 500 mg (2 tab) on day 1; take 250 mg (1 tab) on days 2-5.   . clopidogrel (PLAVIX) 75 mg Oral Tablet take 75 mg by mouth Once a day.     . diclofenac sodium (VOLTAREN) 1 % Gel 2 g by Apply Topically route Three times a day as  needed for up to 30 days   . furosemide (LASIX) 20 mg Oral Tablet take 1 Tab by mouth Twice daily.   Marland Kitchen glimepiride (AMARYL) 4 mg Oral Tablet Take 1 Tab by mouth Once a day   . GLYXAMBI 10-5 mg Oral Tablet Take 1 Tab by mouth Once a day   . HYDROcodone-acetaminophen (NORCO) 10-325 mg Oral Tablet Take 1 Tab by mouth Every 4 hours as needed   . lisinopril (PRINIVIL) 2.5 mg Oral Tablet Take 1 Tab by mouth Once a day   . metformin (GLUCOPHAGE) 500 mg Oral Tablet take 500 mg by mouth Twice daily with food.   . metoprolol (LOPRESSOR) 25 mg Oral Tablet take 12.5 mg by mouth Once per day as needed.     . niacin (NIASPAN) 500 mg Oral Tablet Sustained Release take 500 mg by mouth Once a day.   . Omeprazole (PRILOSEC) 40 mg Oral Capsule, Delayed Release(E.C.) Take 1 Cap by mouth Once a day   . predniSONE (DELTASONE) 20 mg Oral Tablet Take 2 Tabs (40 mg total) by mouth Once a day for 5 days   . PRENATAL PLUS, CALCIUM CARB, 27 mg iron- 1 mg Oral Tablet Take 1 Tab by mouth Once a day   . promethazine (  PHENERGAN) 6.25 mg/5 mL Oral Syrup Take 5 mL (6.25 mg total) by mouth Every 6 hours as needed (cough)   . rosuvastatin (CRESTOR) 20 mg Oral Tablet Take 1 Tab by mouth Once a day       Allergies   Allergen Reactions   . No Known Drug Allergies           Past Medical History:  Past Medical History:   Diagnosis Date   . Coronary artery disease    . Diabetes mellitus type II    . Ear piercing    . GERD (gastroesophageal reflux disease)    . HTN    . Hyperlipidemia    . Hypertension    . Neck problem     herniated cervical disc   . Obesity    . Stroke ([place])     2002   . Tattoo     Left breast, Right shoulder   . Type 2 diabetes mellitus     Dx 1999 average fasting 70-89       Past Surgical History:  Past Surgical History:   Procedure Laterality Date   . Hx ankle fracture tx     . Hx cataract removal     . Hx cesarean section     . Hx cholecystectomy     . Hx tonsillectomy         Social History:  Social History   Substance Use Topics   .  Smoking status: Former Smoker     Packs/day: 0.25     Years: 20.00     Types: Cigarettes     Quit date: 03/30/2011   . Smokeless tobacco: Never Used   . Alcohol use No     History   Drug Use No       Family History:  Family History   Problem Relation Age of Onset   . Diabetes Mother    . Heart Attack Mother    . Hypertension Mother          Old records reviewed.      Objective:  Nursing notes reviewed    Filed Vitals:    12/11/16 1531   BP: 117/75   Pulse: (!) 118   Resp: 18   Temp: 37.1 C (98.7 F)   SpO2: 90%       Physical Exam  Nursing note and vitals reviewed.  Vital signs reviewed as above.   Unremarkable exam.  Constitutional: Pt is well-developed and well-nourished.   Head: Normocephalic and atraumatic.   Eyes: Conjunctivae are normal. Pupils are equal, round, and reactive to light. EOM are intact  Neck: Soft, supple, full range of motion.  Cardiovascular: RRR.  No Murmurs/rubs/gallops. Distal pulses present and equal bilaterally.  Pulmonary/Chest: Normal BS BL with no distress. No audible wheezes or crackles are noted.  Abdominal: Soft, nontender, nondistended.  No rebound, guarding, or masses.  Musculoskeletal: Normal range of motion. No deformities.  Exhibits no edema and no tenderness.   Neurological: CNs 2-12 grossly intact.  No focal deficits noted.  Skin: Warm and dry. No rash or lesions  Psychiatric: Patient has a normal mood and affect.     Work-up:  Orders Placed This Encounter   . RAPID INFLUENZA A/B ANTIGEN   . XR CHEST PA AND LATERAL   . azithromycin (ZITHROMAX) 250 mg Oral Tablet   . promethazine (PHENERGAN) 6.25 mg/5 mL Oral Syrup   . predniSONE (DELTASONE) 20 mg Oral Tablet   . acetaminophen-codeine (TYLENOL-CODEINE #3)  300-30mg  per tablet        Labs:  Results for orders placed or performed during the hospital encounter of 12/11/16 (from the past 24 hour(s))   RAPID INFLUENZA A/B ANTIGEN   Result Value Ref Range    INFLUENZA A ANTIGEN Negative Negative    INFLUENZA B ANTIGEN Negative  Negative       Imaging:     Results for orders placed or performed during the hospital encounter of 12/11/16 (from the past 72 hour(s))   XR CHEST PA AND LATERAL     Status: None    Narrative    Female, 51 years old.    XR CHEST PA AND LATERAL performed on 12/11/2016 4:10 PM.    REASON FOR EXAM:  cough    CHEST (2 views)    COMPARISON: 12/11/2016.    The lungs are well expanded and show no active infiltrative changes or  inflammatory disease.  The costophrenic sulci and diaphragm appear normal.   The heart size and mediastinal contents are within normal limits.   Visualized bone structures appear essentially normal.       Impression    1.  No active disease detected.  2. 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          Abnormal Lab results:  Labs Reviewed   RAPID INFLUENZA A/B ANTIGEN - Normal       Plan: Appropriate labs and imaging ordered. Medical Records reviewed.    MDM:   During the patient's stay in the emergency department, the above listed imaging and/or labs were performed to assist with medical decision making and were reviewed by myself when available for review.     Pt remained stable throughout the emergency department course.        Impression:   Encounter Diagnosis   Name Primary?   . Bronchitis Yes     Disposition:  Discharged       Medication instructions were discussed with the patient.   It was advised that the patient return to the ED with any new, concerning or worsening symptoms and follow up as directed.    The patient verbalized understanding of all instructions and had no further questions or concerns.     Follow up:   [facility]her[facility], [address]  [place] [address]  [phone]    Schedule an appointment as soon as possible for a visit        Prescriptions:   Discharge Medication List as of 12/11/2016  4:31 PM      START taking these medications    Details   azithromycin (ZITHROMAX) 250 mg Oral Tablet Take 500 mg (2 tab) on day 1; take 250 mg (1 tab) on days 2-5.,  Disp-6 Tab, R-0, Print      predniSONE (DELTASONE) 20 mg Oral Tablet Take 2 Tabs (40 mg total) by mouth Once a day for 5 days, Disp-10 Tab, R-0, Print      promethazine (PHENERGAN) 6.25 mg/5 mL Oral Syrup Take 5 mL (6.25 mg total) by mouth Every 6 hours as needed (cough), Disp-120 mL, R-0, Print           I am scribing for, and in the presence of, Scott Isaish Alemu PA-C for services provided on 12/11/2016.  Samantha McClead, SCRIBE     [place], [place]  12/11/2016, 15:33      I personally performed the services described in this documentation, as scribed  in my presence, and it  is both accurate  and complete.  Senaida Ores, PA-C  The co-signing faculty was physically present in the emergency department and available for consultation and did not particpate in the care of this patient.  Senaida Ores, PA-C  12/11/2016, 17:42

## 2016-12-11 NOTE — ED Triage Notes (Signed)
Pt c/o cough, sleeping all day, hot/cold chills that started yesterday.

## 2016-12-11 NOTE — Discharge Instructions (Signed)
Please return if worsening.  Please make follow up appointment.

## 2016-12-16 ENCOUNTER — Encounter (HOSPITAL_BASED_OUTPATIENT_CLINIC_OR_DEPARTMENT_OTHER): Payer: Self-pay | Admitting: PHYSICIAN ASSISTANT

## 2016-12-16 ENCOUNTER — Ambulatory Visit (INDEPENDENT_AMBULATORY_CARE_PROVIDER_SITE_OTHER): Payer: Commercial Managed Care - PPO | Admitting: PHYSICIAN ASSISTANT

## 2016-12-16 VITALS — BP 128/80 | HR 83 | Ht 66.0 in | Wt 286.0 lb

## 2016-12-16 DIAGNOSIS — J209 Acute bronchitis, unspecified: Secondary | ICD-10-CM

## 2016-12-16 MED ORDER — PREDNISONE 20 MG TABLET
20.0000 mg | ORAL_TABLET | Freq: Every day | ORAL | 0 refills | Status: AC
Start: 2016-12-16 — End: 2016-12-18

## 2016-12-16 MED ORDER — FLUTICASONE PROPIONATE 50 MCG/ACTUATION NASAL SPRAY,SUSPENSION
1.0000 | Freq: Every day | NASAL | 0 refills | Status: AC
Start: 2016-12-16 — End: 2016-12-30

## 2016-12-16 MED ORDER — DEXTROMETHORPHAN-GUAIFENESIN 10 MG-100 MG/5 ML ORAL SYRUP
10.00 mL | ORAL_SOLUTION | Freq: Four times a day (QID) | ORAL | 0 refills | Status: AC | PRN
Start: 2016-12-16 — End: 2016-12-26

## 2016-12-16 NOTE — Patient Instructions (Addendum)
Acute Bronchitis  Your healthcare provider has told you that you have acute bronchitis. Bronchitis is infection or inflammation of the bronchial tubes (airways in the lungs). Normally, air moves easily in and out of the airways. Bronchitis narrows the airways, making it harder for air to flow in and out of the lungs. This causes symptoms such as shortness of breath, coughing up yellow or green mucus, and wheezing. Bronchitis can be acute or chronic. Acute means the condition comes on quickly and goes away in a short time, usually within 3 to 10 days. Chronic means a condition lasts a long time and often comes back.    What causes acute bronchitis?  Acute bronchitis almost always starts as a viral respiratory infection, such as a cold or the flu. Certain factors make it more likely for a cold or flu to turn into bronchitis. These include being very young, being elderly, having a heart or lung problem, or having a weak immune system. Cigarette smoking also makes bronchitis more likely.  When bronchitis develops, the airways become swollen. The airways may also become infected with bacteria. This is known as a secondary infection.  Diagnosing acute bronchitis  Your healthcare provider will examine you and ask about your symptoms and health history. You may also have a sputum culture to test the fluid in your lungs. Chest X-rays may be done to look for infection in the lungs.  Treating acute bronchitis  Bronchitis usually clears up as the cold or flu goes away. You can help feel better faster by doing the following:   Take medicine as directed. You may be told to take ibuprofen or other over-the-counter medicines. These help relieve inflammation in your bronchial tubes. Your healthcare provider may prescribe an inhaler to help open up the bronchial tubes. Most of the time,acute bronchitisis caused by a viral infection. Antibiotics are usually not prescribed for viral infections.   Drink plenty of fluids, such as  water, juice, or warm soup. Fluids loosen mucus so that you can cough it up. This helps you breathe more easily. Fluids also prevent dehydration.   Make sure you get plenty of rest.   Do not smoke. Do not allow anyone else to smoke in your home.  Recovery and follow-up  Follow up with your doctor as you are told. You will likely feel better in a week or two. But a dry cough can linger beyond that time. Let your doctor know if you still have symptoms (other than a dry cough) after 2 weeks, or if you're prone to getting bronchial infections. Take steps to protect yourself from future infections. These steps include stopping smoking and avoiding tobacco smoke, washing your hands often, and getting a yearly flu shot.  When to call your healthcare provider  Call the healthcare provider if you have any of the following:   Fever of 100.4F (38.0C) or higher, or as advised   Symptoms that get worse, or new symptoms   Trouble breathing   Symptoms that don't start to improve within a week, or within 3 days of taking antibiotics   Date Last Reviewed: 11/08/2015   2000-2017 [employer] [address], [place] [address]. All rights reserved. This information is not intended as a substitute for professional medical care. Always follow your healthcare professional's instructions.

## 2016-12-16 NOTE — Progress Notes (Signed)
Patient Name: Monique [name]  Date: 12/16/2016  [facility]  [address]  [place] [address]  [phone]  MRN: [id number]  DOB: 06-Nov-1966    Chief complaint: Cough,Cold,Sore Throat     HPI:  The patient is a 51 y.o. old female who came in today for a follow-up visit from the ED complaining of persistent dry cough and chest congestion. Symptoms started out as upper respiratory congestion, and progressed to persistent dry cough, subjective fevers. Her daughter had similar symptoms recently. She was seen in the [facility] ED on 12/11/16 and was diagnosed with bronchitis. CXR showed no acute infiltrates. Flu swab negative. She was prescribed azithromycin, prednisone taper, and cough syrup. She did not take the prednisone secondary to elevated blood sugars while on IV steroids in the past during a hospitalization. She completed the course of antibiotics and cough syrup and states that she feels no better. She is a smoker, smokes approximately 0.5 ppd for the past 35 years. Recent PFT's for pre-op testing showed a pre-bronchodilator FEV1 of 88%. She has not been previously diagnosed with chronic lung disease. ED documentation including results of lab/imaging studies was reviewed with the patient in the room.     Her concern is that she is having weight loss surgery at the end of this month, and wants to make sure that this is cleared up prior to surgery so it does not get cancelled.    ROS:  Pertinent positives noted in red, otherwise negative.    Constitutional: unintentional weight gain/loss, +fever, chills, fatigue, tiredness  Skin: skin problems or rashes, itching  Musculoskeletal: unilateral muscle weakness, atrophy, joint pain/swelling  HEENT: blurred vision, vision loss, +nasal congestion, otalgia, hearing loss, epistaxis, sore throat, difficulty swallowing  Cardiovascular:  heart valve problems/murmurs, irregular heartbeat, angina/chest pain, swelling in feet, swelling  in hands, shortness of breath lying flat, short of breath climbing stairs, palpitations  Respiratory: shortness of breath, +dry cough, hemoptysis, wheezing  Gastrointestinal: abdominal pain, diarrhea, constipation, vomiting blood, black or tarry stools, nausea, GERD  Genitourinary: hematuria, change in urinary stream, urinary frequency, incontinence, dysuria, nocturia, sexual dysfunction  Neurologic: headache, seizures, stroke, dizziness, loss of consciousness, paresthesias, numbness  Psychiatric: anxiety, depression, memory loss, suicidal/homicidal thoughts  Endocrine: +diabetes, polydipsia, polyuria, thyroid problems    Past medical history:  Past Medical History:   Diagnosis Date    Coronary artery disease     Diabetes mellitus type II     Ear piercing     GERD (gastroesophageal reflux disease)     HTN     Hyperlipidemia     Hypertension     Neck problem     herniated cervical disc    Obesity     Stroke [facility])     2002    Tattoo     Left breast, Right shoulder    Type 2 diabetes mellitus     Dx 1999 average fasting 70-89     Past surgical history:  Past Surgical History:   Procedure Laterality Date    HX ANKLE FRACTURE TX      Right    HX CATARACT REMOVAL      r and l eyes with implants    HX CESAREAN SECTION      x 3    HX CHOLECYSTECTOMY      HX TONSILLECTOMY       Medications:  Current Outpatient Prescriptions   Medication Sig    ACCU-CHEK AVIVA PLUS TEST  STRP Strip 1 Strip by Subcutaneous route Three times a day as needed    Aspirin 81 mg Oral Tablet take 81 mg by mouth Once a day.    clopidogrel (PLAVIX) 75 mg Oral Tablet take 75 mg by mouth Once a day.      dextromethorphan-guaiFENesin (ROBITUSSIN DM) 10-100 mg/5 mL Oral Syrup Take 10 mL by mouth Every 6 hours as needed for up to 10 days    diclofenac sodium (VOLTAREN) 1 % Gel 2 g by Apply Topically route Three times a day as needed for up to 30 days    fluticasone (FLONASE) 50 mcg/actuation Nasal Spray, Suspension 1 Spray by Each  Nostril route Once a day for 14 days    furosemide (LASIX) 20 mg Oral Tablet take 1 Tab by mouth Twice daily.    glimepiride (AMARYL) 4 mg Oral Tablet Take 1 Tab by mouth Once a day    GLYXAMBI 10-5 mg Oral Tablet Take 1 Tab by mouth Once a day    HYDROcodone-acetaminophen (NORCO) 10-325 mg Oral Tablet Take 1 Tab by mouth Every 4 hours as needed    lisinopril (PRINIVIL) 2.5 mg Oral Tablet Take 1 Tab by mouth Once a day    metformin (GLUCOPHAGE) 500 mg Oral Tablet take 500 mg by mouth Twice daily with food.    metoprolol (LOPRESSOR) 25 mg Oral Tablet take 12.5 mg by mouth Once per day as needed.      niacin (NIASPAN) 500 mg Oral Tablet Sustained Release take 500 mg by mouth Once a day.    Omeprazole (PRILOSEC) 40 mg Oral Capsule, Delayed Release(E.C.) Take 1 Cap by mouth Once a day    predniSONE (DELTASONE) 20 mg Oral Tablet Take 1 Tab (20 mg total) by mouth Once a day for 2 days , then 0.5 tab daily for 3 days.    PRENATAL PLUS, CALCIUM CARB, 27 mg iron- 1 mg Oral Tablet Take 1 Tab by mouth Once a day    rosuvastatin (CRESTOR) 20 mg Oral Tablet Take 1 Tab by mouth Once a day     Allergies:  Allergies   Allergen Reactions    No Known Drug Allergies      Family history:  Family Medical History     Problem Relation (Age of Onset)    Diabetes Mother    Heart Attack Mother    Hypertension Mother        Social history:  Social History     Social History    Marital status: Married     Spouse name: N/A    Number of children: N/A    Years of education: N/A     Social History Main Topics    Smoking status: Former Smoker     Packs/day: 0.25     Years: 20.00     Types: Cigarettes     Quit date: 03/30/2011    Smokeless tobacco: Never Used    Alcohol use No    Drug use: No    Sexual activity: Not on file     Other Topics Concern    Routine Exercise No    Ability To Walk 2 Flight Of Steps Without Sob/Cp Yes     Social History Narrative       Vitals:    12/16/16 1411   BP: 128/80   Pulse: 83   SpO2: 99%      Weight: 129.7 kg (286 lb)   Height: 1.676 m (5\' 6" )     Body  mass index is 46.16 kg/(m^2).    Physical Examination:  General: appears in good health, morbidly obese, no distress and vital signs reviewed  Eyes: Conjunctiva clear., Pupils equal and round, reactive to light and accomodation. , Sclera non-icteric.   HENT:TM's Clear. , External Ears: normal pinnae shape and position, Ear canals normal., Nose:  +generalized rhinitis, inferior nasal turbinates are erythematous and hypertrophied. Mouth mucous membranes moist. , Pharynx without injection or exudate.   Neck: no thyromegaly or lymphadenopathy and supple, symmetrical, trachea midline  Lungs: Clear to auscultation bilaterally. No wheezing, rales, or rhonchi appreciated. Respirations even and unlabored on room air. No signs or symptoms of respiratory distress at this time.   Cardiovascular: regular rate and rhythm, S1, S2 normal, no murmur, click, rub or gallop  Abdomen: Soft, non-tender, Bowel sounds normal, non-distended  Genito-urinary: Deferred  Extremities: extremities normal, atraumatic, no cyanosis or edema  Skin: Skin color, texture, turgor normal. No rashes or lesions  Neurologic: Normal mental status, sensory, motor, cranial nerves, reflexes, coordination, and gait.  Lymphatics: No lymphadenopathy  Psychiatric: Normal affect, behavior, memory, thought content, judgement, and speech.    Results for orders placed or performed during the hospital encounter of 12/11/16 (from the past 2217.6 hour(s))   RAPID INFLUENZA A/B ANTIGEN   Result Value Ref Range    INFLUENZA A ANTIGEN Negative Negative    INFLUENZA B ANTIGEN Negative Negative   Results for orders placed or performed in visit on 10/23/16 (from the past 2217.6 hour(s))   CBC   Result Value Ref Range    WBC 11.1 (H) 4.8 - 10.8 x103/uL    RBC 5.04 4.20 - 5.40 x106/uL    HGB 14.8 12.0 - 16.0 g/dL    HCT 45.0 37.0 - 47.0 %    MCV 89.3 80.0 - 99.0 fL    MCH 29.4 27.0 - 31.0 pg    MCHC 32.9 (L) 33.0 -  37.0 g/dL    RDW 14.4 11.5 - 14.5 %    PLATELETS 382 130 - 400 x103/uL   COMPREHENSIVE METABOLIC PNL, FASTING   Result Value Ref Range    SODIUM 143 137 - 145 mmol/L    POTASSIUM 4.6 3.6 - 5.0 mmol/L    CHLORIDE 107 101 - 111 mmol/L    CO2 TOTAL 23 22 - 31 mmol/L    ANION GAP 13 mmol/L    BUN 16 7 - 18 mg/dL    CREATININE 1.00 0.50 - 1.20 mg/dL    BUN/CREA RATIO 16     ESTIMATED GFR 58 (L) >=60 mL/min/1.56m2    ALBUMIN 3.9 3.6 - 4.8 g/dL    CALCIUM 9.7 8.5 - 10.3 mg/dL    GLUCOSE 167 (H) 68 - 99 mg/dL    ALKALINE PHOSPHATASE 109 38 - 126 U/L    ALT (SGPT) 29 3 - 45 U/L    AST (SGOT) 22 7 - 56 U/L    BILIRUBIN TOTAL 0.4 0.2 - 1.3 mg/dL    PROTEIN TOTAL 6.8 6.2 - 8.0 g/dL   FOLATE   Result Value Ref Range    FOLATE >20.0 ng/mL   LIPID PANEL   Result Value Ref Range    TRIGLYCERIDES 165 30 - 199 mg/dL    CHOLESTEROL 167 130 - 199 mg/dL    HDL CHOL 50 40 - 59 mg/dL    LDL CALC 84 60 - 129 mg/dL   T3 (TRIIODOTHYRONINE), FREE, SERUM   Result Value Ref Range    T3 FREE 2.8 2.8 -  5.2 pg/mL   THYROID STIMULATING HORMONE (SENSITIVE TSH)   Result Value Ref Range    TSH 0.763 0.465 - 4.680 uIU/mL   THYROXINE, FREE (FREE T4)   Result Value Ref Range    THYROXINE (T4), FREE 1.04 0.78 - 2.19 ng/dL   VITAMIN B12   Result Value Ref Range    VITAMIN B 12 461 239 - 931 pg/mL   VITAMIN D 25, TOTAL   Result Value Ref Range    VITAMIN D 25, TOTAL 27.30 (L) 30.00 - 100.00 ng/mL       Visit Diagnosis    ICD-10-CM    1. Acute bronchitis, unspecified organism  Reassurance provided to patient that this is most likely viral in etiology. Flu swab and CXR negative 5 days ago.  Completed course of azithromycin, coverage for atypical organisms.  Cough persists.  Start expectorant - Mucinex.  Start cough suppressant - dextromethorphan PRN.   Patient has agreed to try a lower dose of steroids at this time, will monitor blood sugars closely while taking.   Increase clear fluid intake.   Encouraged smoking cessation, patient states she is quitting  secondary to surgery at the end of this month.  Start Flonase nasal spray for congestion. J20.9 predniSONE (DELTASONE) 20 mg Oral Tablet  dextromethorphan-guaiFENesin (ROBITUSSIN DM) 10-100 mg/5 mL Oral Syrup  fluticasone (FLONASE) 50 mcg/actuation Nasal Spray, Suspension     Orders Placed This Encounter    predniSONE (DELTASONE) 20 mg Oral Tablet    dextromethorphan-guaiFENesin (ROBITUSSIN DM) 10-100 mg/5 mL Oral Syrup    fluticasone (FLONASE) 50 mcg/actuation Nasal Spray, Suspension     Return if symptoms worsen or fail to improve.    Keep regularly scheduled follow-up appointment with Dr. Russ Halo.     Chryl Heck, PA-C  12/16/2016, 15:04  Electronically signed by Chryl Heck, PA-C

## 2017-01-02 ENCOUNTER — Other Ambulatory Visit (HOSPITAL_BASED_OUTPATIENT_CLINIC_OR_DEPARTMENT_OTHER): Payer: Self-pay | Admitting: Family Medicine

## 2017-02-05 HISTORY — PX: HX GASTRIC SLEEVE: 2100003104

## 2017-02-09 ENCOUNTER — Telehealth (INDEPENDENT_AMBULATORY_CARE_PROVIDER_SITE_OTHER): Payer: Self-pay | Admitting: Cardiovascular Disease

## 2017-02-16 ENCOUNTER — Ambulatory Visit (INDEPENDENT_AMBULATORY_CARE_PROVIDER_SITE_OTHER): Payer: Commercial Managed Care - PPO | Admitting: Family Medicine

## 2017-02-16 DIAGNOSIS — Z6841 Body Mass Index (BMI) 40.0 and over, adult: Secondary | ICD-10-CM

## 2017-02-16 MED ORDER — EXENATIDE ER 2 MG SUBCUTANEOUS EXTENDED RELEASE SUSPENSION: 2 mg | Vial | SUBCUTANEOUS | 5 refills | 0 days | Status: DC

## 2017-02-16 NOTE — Progress Notes (Signed)
Patient Name: Monique [name]  Date: 02/16/2017  [facility]  [address]  [place] [address]  [id number]  MRN: [id number]  DOB: [birth year]    Chief complaint: Diabetes follow-up    HPI: The patient is a 51 y.o. old female who came in today for     Diabetes; Patient is a 51 year old female who present to the office today for a follow up for diabetes.  Last office visit was 3 months ago.  At that time her A1C was 8.8.  She denies polydipsia, polyuria, polyphagia, weight loss, fatigue, blurred vision, dizziness or sweating.  Patient currently checks her blood glucose 3-4 times daily.  Current treatment includes oral medcation (Metformin, Glimeperide, Glyxambi). Patients states she will be having weight loss surgery soon with Dr. Vernard Gambles in [place].  She is hopeful she will be able to decrease the number of diabetic medcations after surgery.    She continues efforts at weight loss through diet and exercise.     ROS:    General Not Present- Chills, Fatigue, Fever, Tiredness and Weight Loss.  Skin Not Present- Itching, New Lesions and Rash.  HEENT Not Present- Blurred Vision, Ear Pain, Itchy Eyes, Nasal Congestion, Nose Bleed, Sore Throat and Visual Loss.  Respiratory Not Present- Bloody sputum, Cough, Sputum Production and Wheezing.  Cardiovascular Not Present- Chest Pain, Edema, Murmur, Palpitations and Shortness of Breath.  Gastrointestinal Present- Appetite. Not Present- Constipation, Diarrhea, Nausea, Rectal Bleeding and Vomiting.  Female Genitourinary Not Present- Absence of Menstruation, Menstrual Irregularities, Pelvic Pain, Vaginal Bleeding and Vaginal Discharge.  Musculoskeletal Not Present- Joint Pain, Joint Stiffness and Joint Swelling.  Neurological Not Present- Headaches, Loss of Consciousness, Numbness, Seizures, Tingling and Weakness.  Psychiatric Not Present- Anxiety, Depression, Memory Loss and Suicidal Ideation.  Endocrine Present- Diabetes. Not  Present- Excessive Thirst, Polydipsia, Polyuria and Thyroid Problems.    Medications:  Current Outpatient Prescriptions   Medication Sig    ACCU-CHEK AVIVA PLUS TEST STRP Strip 1 Strip by Subcutaneous route Three times a day as needed    Aspirin 81 mg Oral Tablet take 81 mg by mouth Once a day.    clopidogrel (PLAVIX) 75 mg Oral Tablet take 75 mg by mouth Once a day.      exenatide microspheres ER (BYDUREON ER) 2 mg 2 mg by Subcutaneous route Every 7 days for 30 days For use after bariatric surgery.    furosemide (LASIX) 20 mg Oral Tablet take 1 Tab by mouth Twice daily.    glimepiride (AMARYL) 4 mg Oral Tablet Take 1 Tab by mouth Once a day    HYDROcodone-acetaminophen (NORCO) 10-325 mg Oral Tablet Take 1 Tab by mouth Every 4 hours as needed    lisinopril (PRINIVIL) 2.5 mg Oral Tablet Take 1 Tab by mouth Once a day    metformin (GLUCOPHAGE) 500 mg Oral Tablet take 500 mg by mouth Twice daily with food.    metoprolol (LOPRESSOR) 25 mg Oral Tablet take 12.5 mg by mouth Once per day as needed.      niacin (NIASPAN) 500 mg Oral Tablet Sustained Release take 500 mg by mouth Once a day.    Omeprazole (PRILOSEC) 40 mg Oral Capsule, Delayed Release(E.C.) Take 1 Cap by mouth Once a day    PRENATAL PLUS, CALCIUM CARB, 27 mg iron- 1 mg Oral Tablet Take 1 Tab by mouth Once a day    rosuvastatin (CRESTOR) 20 mg Oral Tablet Take 1 Tab by mouth Once a  day     Allergies:  Allergies   Allergen Reactions    No Known Drug Allergies      Past medical history:  Past Medical History:   Diagnosis Date    Coronary artery disease     Diabetes mellitus type II     Ear piercing     GERD (gastroesophageal reflux disease)     HTN     Hyperlipidemia     Hypertension     Neck problem     herniated cervical disc    Obesity     Stroke ([place])     2002    Tattoo     Left breast, Right shoulder    Type 2 diabetes mellitus     Dx 1999 average fasting 70-89     Surgical history:  Past Surgical History:   Procedure Laterality  Date    HX ANKLE FRACTURE TX      Right    HX CATARACT REMOVAL      r and l eyes with implants    HX CESAREAN SECTION      x 3    HX CHOLECYSTECTOMY      HX TONSILLECTOMY       Family history:  Family Medical History     Problem Relation (Age of Onset)    Diabetes Mother    Heart Attack Mother    Hypertension Mother        Social history:  Social History     Social History    Marital status: Married     Spouse name: N/A    Number of children: N/A    Years of education: N/A     Social History Main Topics    Smoking status: Former Smoker     Packs/day: 0.25     Years: 20.00     Types: Cigarettes     Quit date: 03/30/2011    Smokeless tobacco: Never Used    Alcohol use No    Drug use: No    Sexual activity: Not on file     Other Topics Concern    Routine Exercise No    Ability To Walk 2 Flight Of Steps Without Sob/Cp Yes     Social History Narrative     Vitals:  Vitals:    02/16/17 1619   BP: 138/80   Pulse: 74   SpO2: 97%   Weight: 128.8 kg (284 lb)   Height: 1.676 m (5\' 6" )     Body mass index is 45.84 kg/(m^2).     Physical Examination:    General Characteristics  Overall examination of the patient's skin reveals - no rashes and no suspicious lesions. Color - normal coloration of skin.  Problem #1 Location - General - Note: upper eyelid w/Xythoma-lipid plaques.   Body Habitus:  Obease-apple like.    Head and Neck  Face Global Assessment - atraumatic.  Eye  Sclera/Conjunctiva - Bilateral-Normal.  Pupil - Bilateral-Normal, Direct reaction to light normal, Equal and Round.    ENMT  Global Assessment  Examination of related systems reveals - trachea is midline, with no palpable neck masses and no palpable thyroid masses or prominent nodules, no thyromegaly.  Ears External Auditory Canal - Bilateral - no cerumen impaction noted. Otoscopic Exam - Tympanic Membrane - Left - clear. Right - clear. External Auditory Meatus - Bilateral - no cerumen impaction noted.    Chest and Lung Exam  Chest and lung exam  reveals -normal excursion with symmetric  chest walls, quiet, even and easy respiratory effort with no use of accessory muscles and on auscultation, normal breath sounds, no adventitious sounds and normal vocal resonance.  Note: midline chest scar consistent w/ CABG.  Healed. Non-tender. unchanged.     Cardiovascular  Cardiovascular examination reveals -normal heart sounds, regular rate and rhythm with no murmurs, carotid auscultation reveals no bruits and abdominal aorta auscultation reveals no bruits.    Abdomen  Inspection  Contour - Normal.  Palpation/Percussion reveal - Non Tender, No hepatosplenomegaly and No Palpable abdominal masses.  Auscultation  reveals - No Abdominal bruits. Bowel sounds absent - Generalized.    Peripheral Vascular  Upper Extremity Palpation - Radial pulse - Bilateral - Normal.  Lower Extremity Palpation - Edema - Bilateral - No edema.    Neurologic  Mental Status  Affect - normal.  Motor Strength - 5/5 normal muscle strength - Left Lower Extremity and Right Lower Extremity.    Neuropsychiatric  Mental status exam performed with findings of-able to articulate well with normal speech/language, rate, volume and coherence, no evidence of hallucinations, delusions, obsessions or homicidal/suicidal ideation, demonstrates appropriate judgment and insight and attention span and ability to concentrate are normal.  Mood and affect-anxious,  regarding diabetes medications.    Musculoskeletal  Global Assessment  Examination of related systems reveals - no digital clubbing or cyanosis and neurovascularly intact globally with normal deep tendon reflexes. Gait and Station - normal gait and station.    Lymphatic  General Lymphatics  Description - No Localized lymphadenopathy.    LABS:     Results for orders placed or performed during the hospital encounter of 12/11/16 (from the past 2217.6 hour(s))   RAPID INFLUENZA A/B ANTIGEN   Result Value Ref Range    INFLUENZA A ANTIGEN Negative Negative     INFLUENZA B ANTIGEN Negative Negative     Visit Diagnosis    Encounter Diagnoses   Name Primary?    Morbid obesity ([place]) Yes     Orders Placed This Encounter    exenatide microspheres ER (BYDUREON ER) 2 mg         Controlled type 2 diabetes mellitus without complication, without long-term current use of insulin (250.00   E11.9)  Impression: Chronic  Controlled.  Continue behavioral efforts, esp  Diabetic diet, < 1800Kcal Q day  Exercise 20 min a day X 6 days a week.  Continue current medications.-until the the night before surgery.  Will need yearly foot exam, retinal exam, microalbumin, LDL.  will need A1C q 3 months.  Labs reviewed as above.   She offeres that she will do them today.  Last A1C returns 8.8,  I recommend she use her metformin as ordered.    It is after hours when her exam occurs  We do pick up an A1c.  The A1c is elevated in the 9 range.  She will need transition from oral meds to IM meds  Given that she is about to have bariatric surgery.  She is resistant insulin.  Reports that Victoza as previously caused her increased sugar levels and weight gain.  Given the difficulty of selection  I strongly recommend the patient come back to review the specific topic.  I will attempt to get her Bydureon 2 mg subcu q.week.  This will help both with compliance and the transition from oral diet and meds.      Morbid obesity due to excess calories (278.01   E66.01)  Impression: Chronic  Uncontrolled.   Dr.  Chen's planning surgery next month..  She will follow through on his recommendations.    Return in about 1 week (around 02/23/2017), or if symptoms worsen or fail to improve, for Diabetes management.Cherly Beach, MD  02/16/2017, 17:46

## 2017-02-16 NOTE — Patient Instructions (Signed)
Eating a High-Fiber Diet  Fiber is what gives strength and structure to plants. Most grains, beans, vegetables, and fruits contain fiber. Foods rich in fiber are often low in calories and fat, and they fill you up more. They may also reduce your risks for certain health problems.To find out the amount of fiber in canned, packaged, or frozen foods, read the Nutrition Facts label. It tells you how much fiber is in one serving.    Types of fiber and their benefits  There are two types of fiber: insoluble and soluble. They both aid digestion and help you maintain a healthy weight.   Insoluble fiber. This is found in whole grains, cereals, certain fruits and vegetables such as apple skin, corn, and carrots. Insoluble fiber may prevent constipation and reduce the risk for certain types of cancer. It is called insoluble because it does not dissolve in water.   Soluble fiber. This type of fiber is in oats, beans, and certain fruits and vegetables such as strawberries and peas. Soluble fiber can reduce cholesterol, which may help lower the risk for heart disease. It also helps control blood sugar levels.  Look for high-fiber foods  Try these foods to add fiber to your diet:   Whole-grain breads and cereals. Try to eat6 to 8 ounces a day. Include wheat and oat bran cereals, whole-wheat muffins or toast, and corn tortillas in your meals.   Fruits. Try to eat2 cups a day. Apples, oranges, strawberries, pears, and bananas are good sources. (Note: Fruit juice is lowin fiber.)   Vegetables. Try to eat at least 2.5 cups a day. Add asparagus, carrots, broccoli, peas, and corn to your meals.   Beans.One cup of cooked lentils gives you over15 grams of fiber. Try navy beans, lentils, and chickpeas.   Seeds.A small handful of seeds gives you about 3 grams of fiber. Try sunflower or chia seeds.  Keep track of your fiber  Keep track of how much fiber you eat. Start by reading food labels. Then eat a variety of foods  high in fiber. As you start to eat more fiber, ask your healthcare provider how much water you should be drinking to keep your digestive system working smoothly.  Aim for a certain amount of fiber in your diet each day. If you are a woman, that amount is between 25 and 28grams per day. Men should aim for 30 to 33 grams per day. After age 76, your daily fiber needs drop to 22 grams for women and28 grams for men.  Before you reach for the fiber supplements, think about this. Fiber is found naturally in healthy whole foods. It gives you that feeling of fullness after you eat. Taking fiber supplements or eating fiber-enrichedfoods will not give you this full feeling.  Your fiber intake is a good measure for the quality of your overall diet. If you are missing out on your daily amount of fiber, you may be lacking other important nutrients as well.  Date Last Reviewed: 05/08/2016   2000-2017 [employer]. [address] [place] [address]. All rights reserved. This information is not intended as a substitute for professional medical care. Always follow your healthcare professional's instructions.        Healthy Meals for Diabetes     A healthcare provider will help you develop a meal plan that fits your needs.   Ask your healthcare team to help you make a meal plan that fits your needs. Your meal plan  tells you when to eat your meals and snacks, what kinds of foods to eat, and how much of each food to eat. You don't have to give up all the foods you like. But you do need to follow some guidelines.  Choose healthy carbohydrates  Starches, sugars, and fiber are all types of carbohydrates.Fiber can help lower your cholesterol and triglycerides. Fiber is also healthy for your heart. You should have 20 to 35 grams of total fiber each day. Fiber-rich foods include:   Whole-grain breads and cereals   Bulgur wheat   Brown rice    Whole-wheat pasta   Fruits and vegetables   Dry beans, and peas    Keep track of the amount of carbohydrates you eat. This can help you keep the right balance of physical activity and medicine. The amount of carbohydrates needed will vary for each person. It depends on many things such as your health, the medicines you take, and how active you are. Your healthcare team will help you figure out the right amount of carbohydrates for you. You may start with around 45 to 60 grams of carbohydrates per meal, depending on your situation.  Here are some examples of foods containing about 15 grams of carbohydrates (1 serving of carbohydrates):   1/2 cup of canned or frozen fruit   A small piece of fresh fruit (4 ounces)   1 slice of bread   1/2 cup of oatmeal   1/3 cup of rice   4 to 6 crackers   1/2 English muffin   1/2 cup of black beans   1/4 of a large baked potato (3 ounces)   2/3 cup of plain fat-free yogurt   1 cup of soup   1/2 cup of casserole   6 chicken nuggets   2-inch-square brownie or cake without frosting   2 small cookies   1/2 cup of ice cream or sherbet  Choose healthy protein foods  Eating protein that is low in fat can help you control your weight. It also helps keep your heart healthy. Low-fat protein foods include:   Fish   Plant proteins, such as dry beans and peas, nuts, and soy products like tofu and soymilk   Lean meat with all visible fat removed   Poultry with the skin removed   Low-fat ornonfat milk, cheese, and yogurt  Limit unhealthy fats and sugar  Saturated and trans fats are unhealthy for your heart. They raise LDL (bad) cholesterol. Fat is also high in calories, so it can make you gain weight. To cut down on unhealthy fats and sugar, limit these foods:   Butter or margarine   Palm and palm kernel oils and coconut oil   Cream   Cheese   Bacon   Lunch meats    Ice cream   Sweet bakery goods such as pies, muffins, and donuts   Jams and jellies   Candy bars   Regular sodas   How much to eat  The amount of food you eat  affects your blood sugar. It also affects your weight. Your healthcare team will tell you how much of each type of food you should eat.   Use measuring cups and spoons and afood scale to measure serving sizes.   Learn what a correct serving size looks like on your plate. This will help when you are away from home and can't measure your servings.   Eat only the number of servings given on your meal plan for  each food. Don't take seconds.   Learn to read food labels. Be sure to look at serving size, total carbohydrates, fiber, calories, sugar, and saturated and trans fats. Look for healthier alternatives to foods that have added sugar.   Plan ahead for parties so you can still have a good time without going overboard with unhealthy food choices. Set a good example yourself by bringing a healthy dish to pot lucks.  Choose healthy snacks  When it comes to snacks, we usually think about foods with added sugar and fats. But there are many other options for healthier snack choices. Here are a few snack ideas to choose from:  Snacks with less than 5 grams of carbohydrates   1 piece of string cheese   3 celery sticks plus 1 tablespoon of peanut butter   5 cherry tomatoes plus 1 tablespoon of ranch dressing   1 hard-boiled egg   1/4 cup of fresh blueberries   5 baby carrots   1 cup of light popcorn   1/2 cup of sugar-free gelatin   15 almonds  Snacks with about 10 to 20 grams of carbohydrates   1/3 cup of hummus plus 1 cup of fresh cut nonstarchy vegetables (carrots, green peppers, broccoli, celery, or a combination)   1/2 cup of fresh or canned fruit plus 1/4 cup of cottage cheese   1/2 cup of tuna salad with 4 crackers   2 rice cakes and a tablespoon of peanut butter   1 small apple or orange   3 cups light popcorn   1/2 of a [place] sandwich (1 slice of whole-wheat bread, 2 ounces of [place], and mustard)  Portion sizes are important to controlling your blood sugar and staying at a healthy weight.  Stock up on healthy snack items so you always have them on hand.  When to eat  Your meal plan will likely include breakfast, lunch, dinner, and some snacks.   Try to eat your meals and snacks at about the same times each day.   Eat all your meals and snacks. Skipping a meal or snack can make your blood sugar drop too low. It can also cause you to eat too much at the next meal or snack. Then your blood sugar could get too high.  Date Last Reviewed: 06/08/2015   2000-2017 [employer]. [address] [place] [address]. All rights reserved. This information is not intended as a substitute for professional medical care. Always follow your healthcare professional's instructions.        Healthy Meals for Diabetes     A healthcare provider will help you develop a meal plan that fits your needs.   Ask your healthcare team to help you make a meal plan that fits your needs. Your meal plan tells you when to eat your meals and snacks, what kinds of foods to eat, and how much of each food to eat. You don't have to give up all the foods you like. But you do need to follow some guidelines.  Choose healthy carbohydrates  Starches, sugars, and fiber are all types of carbohydrates.Fiber can help lower your cholesterol and triglycerides. Fiber is also healthy for your heart. You should have 20 to 35 grams of total fiber each day. Fiber-rich foods include:   Whole-grain breads and cereals   Bulgur wheat   Brown rice    Whole-wheat pasta   Fruits and vegetables   Dry beans, and peas   Keep track of  the amount of carbohydrates you eat. This can help you keep the right balance of physical activity and medicine. The amount of carbohydrates needed will vary for each person. It depends on many things such as your health, the medicines you take, and how active you are. Your healthcare team will help you figure out the right amount of carbohydrates for you. You may start with around 45 to 60 grams of  carbohydrates per meal, depending on your situation.  Here are some examples of foods containing about 15 grams of carbohydrates (1 serving of carbohydrates):   1/2 cup of canned or frozen fruit   A small piece of fresh fruit (4 ounces)   1 slice of bread   1/2 cup of oatmeal   1/3 cup of rice   4 to 6 crackers   1/2 English muffin   1/2 cup of black beans   1/4 of a large baked potato (3 ounces)   2/3 cup of plain fat-free yogurt   1 cup of soup   1/2 cup of casserole   6 chicken nuggets   2-inch-square brownie or cake without frosting   2 small cookies   1/2 cup of ice cream or sherbet  Choose healthy protein foods  Eating protein that is low in fat can help you control your weight. It also helps keep your heart healthy. Low-fat protein foods include:   Fish   Plant proteins, such as dry beans and peas, nuts, and soy products like tofu and soymilk   Lean meat with all visible fat removed   Poultry with the skin removed   Low-fat ornonfat milk, cheese, and yogurt  Limit unhealthy fats and sugar  Saturated and trans fats are unhealthy for your heart. They raise LDL (bad) cholesterol. Fat is also high in calories, so it can make you gain weight. To cut down on unhealthy fats and sugar, limit these foods:   Butter or margarine   Palm and palm kernel oils and coconut oil   Cream   Cheese   Bacon   Lunch meats    Ice cream   Sweet bakery goods such as pies, muffins, and donuts   Jams and jellies   Candy bars   Regular sodas   How much to eat  The amount of food you eat affects your blood sugar. It also affects your weight. Your healthcare team will tell you how much of each type of food you should eat.   Use measuring cups and spoons and afood scale to measure serving sizes.   Learn what a correct serving size looks like on your plate. This will help when you are away from home and can't measure your servings.   Eat only the number of servings given on your meal plan for each  food. Don't take seconds.   Learn to read food labels. Be sure to look at serving size, total carbohydrates, fiber, calories, sugar, and saturated and trans fats. Look for healthier alternatives to foods that have added sugar.   Plan ahead for parties so you can still have a good time without going overboard with unhealthy food choices. Set a good example yourself by bringing a healthy dish to pot lucks.  Choose healthy snacks  When it comes to snacks, we usually think about foods with added sugar and fats. But there are many other options for healthier snack choices. Here are a few snack ideas to choose from:  Snacks with less than 5 grams of  carbohydrates   1 piece of string cheese   3 celery sticks plus 1 tablespoon of peanut butter   5 cherry tomatoes plus 1 tablespoon of ranch dressing   1 hard-boiled egg   1/4 cup of fresh blueberries   5 baby carrots   1 cup of light popcorn   1/2 cup of sugar-free gelatin   15 almonds  Snacks with about 10 to 20 grams of carbohydrates   1/3 cup of hummus plus 1 cup of fresh cut nonstarchy vegetables (carrots, green peppers, broccoli, celery, or a combination)   1/2 cup of fresh or canned fruit plus 1/4 cup of cottage cheese   1/2 cup of tuna salad with 4 crackers   2 rice cakes and a tablespoon of peanut butter   1 small apple or orange   3 cups light popcorn   1/2 of a [place] sandwich (1 slice of whole-wheat bread, 2 ounces of [place], and mustard)  Portion sizes are important to controlling your blood sugar and staying at a healthy weight. Stock up on healthy snack items so you always have them on hand.  When to eat  Your meal plan will likely include breakfast, lunch, dinner, and some snacks.   Try to eat your meals and snacks at about the same times each day.   Eat all your meals and snacks. Skipping a meal or snack can make your blood sugar drop too low. It can also cause you to eat too much at the next meal or snack. Then your blood sugar could get  too high.  Date Last Reviewed: 06/08/2015   2000-2017 [employer]. [address] [place] [address]. All rights reserved. This information is not intended as a substitute for professional medical care. Always follow your healthcare professional's instructions.        Healthy Meals for Diabetes     A healthcare provider will help you develop a meal plan that fits your needs.   Ask your healthcare team to help you make a meal plan that fits your needs. Your meal plan tells you when to eat your meals and snacks, what kinds of foods to eat, and how much of each food to eat. You don't have to give up all the foods you like. But you do need to follow some guidelines.  Choose healthy carbohydrates  Starches, sugars, and fiber are all types of carbohydrates.Fiber can help lower your cholesterol and triglycerides. Fiber is also healthy for your heart. You should have 20 to 35 grams of total fiber each day. Fiber-rich foods include:   Whole-grain breads and cereals   Bulgur wheat   Brown rice    Whole-wheat pasta   Fruits and vegetables   Dry beans, and peas   Keep track of the amount of carbohydrates you eat. This can help you keep the right balance of physical activity and medicine. The amount of carbohydrates needed will vary for each person. It depends on many things such as your health, the medicines you take, and how active you are. Your healthcare team will help you figure out the right amount of carbohydrates for you. You may start with around 45 to 60 grams of carbohydrates per meal, depending on your situation.  Here are some examples of foods containing about 15 grams of carbohydrates (1 serving of carbohydrates):   1/2 cup of canned or frozen fruit   A small piece of fresh fruit (4 ounces)   1 slice of  bread   1/2 cup of oatmeal   1/3 cup of rice   4 to 6 crackers   1/2 English muffin   1/2 cup of black beans   1/4 of a large baked potato (3 ounces)   2/3 cup of plain  fat-free yogurt   1 cup of soup   1/2 cup of casserole   6 chicken nuggets   2-inch-square brownie or cake without frosting   2 small cookies   1/2 cup of ice cream or sherbet  Choose healthy protein foods  Eating protein that is low in fat can help you control your weight. It also helps keep your heart healthy. Low-fat protein foods include:   Fish   Plant proteins, such as dry beans and peas, nuts, and soy products like tofu and soymilk   Lean meat with all visible fat removed   Poultry with the skin removed   Low-fat ornonfat milk, cheese, and yogurt  Limit unhealthy fats and sugar  Saturated and trans fats are unhealthy for your heart. They raise LDL (bad) cholesterol. Fat is also high in calories, so it can make you gain weight. To cut down on unhealthy fats and sugar, limit these foods:   Butter or margarine   Palm and palm kernel oils and coconut oil   Cream   Cheese   Bacon   Lunch meats    Ice cream   Sweet bakery goods such as pies, muffins, and donuts   Jams and jellies   Candy bars   Regular sodas   How much to eat  The amount of food you eat affects your blood sugar. It also affects your weight. Your healthcare team will tell you how much of each type of food you should eat.   Use measuring cups and spoons and afood scale to measure serving sizes.   Learn what a correct serving size looks like on your plate. This will help when you are away from home and can't measure your servings.   Eat only the number of servings given on your meal plan for each food. Don't take seconds.   Learn to read food labels. Be sure to look at serving size, total carbohydrates, fiber, calories, sugar, and saturated and trans fats. Look for healthier alternatives to foods that have added sugar.   Plan ahead for parties so you can still have a good time without going overboard with unhealthy food choices. Set a good example yourself by bringing a healthy dish to pot lucks.  Choose healthy snacks   When it comes to snacks, we usually think about foods with added sugar and fats. But there are many other options for healthier snack choices. Here are a few snack ideas to choose from:  Snacks with less than 5 grams of carbohydrates   1 piece of string cheese   3 celery sticks plus 1 tablespoon of peanut butter   5 cherry tomatoes plus 1 tablespoon of ranch dressing   1 hard-boiled egg   1/4 cup of fresh blueberries   5 baby carrots   1 cup of light popcorn   1/2 cup of sugar-free gelatin   15 almonds  Snacks with about 10 to 20 grams of carbohydrates   1/3 cup of hummus plus 1 cup of fresh cut nonstarchy vegetables (carrots, green peppers, broccoli, celery, or a combination)   1/2 cup of fresh or canned fruit plus 1/4 cup of cottage cheese   1/2 cup of tuna salad with  4 crackers   2 rice cakes and a tablespoon of peanut butter   1 small apple or orange   3 cups light popcorn   1/2 of a [place] sandwich (1 slice of whole-wheat bread, 2 ounces of [place], and mustard)  Portion sizes are important to controlling your blood sugar and staying at a healthy weight. Stock up on healthy snack items so you always have them on hand.  When to eat  Your meal plan will likely include breakfast, lunch, dinner, and some snacks.   Try to eat your meals and snacks at about the same times each day.   Eat all your meals and snacks. Skipping a meal or snack can make your blood sugar drop too low. It can also cause you to eat too much at the next meal or snack. Then your blood sugar could get too high.  Date Last Reviewed: 06/08/2015   2000-2017 [employer]. [address] [place] [address]. All rights reserved. This information is not intended as a substitute for professional medical care. Always follow your healthcare professional's instructions.

## 2017-02-16 NOTE — Nursing Note (Signed)
02/16/17 1600   Depression Screen   Little interest or pleasure in doing things. 0   Feeling down, depressed, or hopeless 0   PHQ 2 Total 0

## 2017-02-16 NOTE — Nursing Note (Signed)
02/16/17 1600   A1C   A1C 9.6   Initials trperry

## 2017-02-26 ENCOUNTER — Ambulatory Visit (INDEPENDENT_AMBULATORY_CARE_PROVIDER_SITE_OTHER): Payer: Commercial Managed Care - PPO | Admitting: Family Medicine

## 2017-02-26 VITALS — BP 124/80 | HR 80 | Ht 66.0 in | Wt 280.0 lb

## 2017-02-26 DIAGNOSIS — Z6841 Body Mass Index (BMI) 40.0 and over, adult: Secondary | ICD-10-CM

## 2017-02-26 DIAGNOSIS — E119 Type 2 diabetes mellitus without complications: Secondary | ICD-10-CM

## 2017-02-26 NOTE — Progress Notes (Signed)
Patient Name: Monique [name]  Date: 02/26/2017  [facility]  [address]  [place] [address]  [phone]  MRN: [id number]  DOB: 04/10/1966    Chief complaint: Diabetes follow-up    HPI: The patient is a 51 y.o. old female who came in today for     Diabetes:  Symptoms include fatigue, blurred vision, irritability and dizziness, while symptoms do not include polydipsia, polyuria, polyphagia, weight loss, somnolence or sweating. Symptom onset was gradual years ago. There is no known event that preceded symptom onset. The symptoms occur constantly. The episodes occur daily. This is described as moderate in severity and unchanged. Symptoms are exacerbated by stress, skipping insulin and missing meals. Symptoms are not relieved by regular exercise,  stress management, following diet or taking insulin. Associated symptoms do not include excessive hunger, nausea, vomiting, anorexia, abdominal pain, fruity breath, amenorrhea or seizures.    Obesity:  Symptoms include excess weight, weight increase, inability to lose weight and fatigue, while symptoms do not include dyspnea, excessive sweating, edema, knee pain, back pain or joint pain. Symptoms are not exacerbated by binge eating, exercise intolerance, sugar cravings or social isolation. Symptoms are not relieved by low calorie diet, exercise, adequate water, support activities or preparing foods. Associated symptoms do not include depressed mood, anxiety, sexual dysfunction, poor self esteem, cystic skin lesions, tinea corpora, impaired range of motion, dark skin lesions or skin infections. The patient is scheduled for surgery next week, 03/04/17 with Dr. Pearlie Oyster.       ROS:    General Not Present- Chills, Fatigue, Fever, Tiredness and Weight Loss.  Skin Not Present- Itching, New Lesions and Rash.  HEENT Not Present- Blurred Vision, Ear Pain, Itchy Eyes, Nasal Congestion, Nose Bleed, Sore Throat and Visual Loss.  Respiratory  Not Present- Bloody sputum, Cough, Sputum Production and Wheezing.  Cardiovascular Not Present- Chest Pain, Edema, Murmur, Palpitations and Shortness of Breath.  Gastrointestinal Present- Appetite. Not Present- Constipation, Diarrhea, Nausea, Rectal Bleeding and Vomiting.  Female Genitourinary Not Present- Absence of Menstruation, Menstrual Irregularities, Pelvic Pain, Vaginal Bleeding and Vaginal Discharge.  Musculoskeletal Not Present- Joint Pain, Joint Stiffness and Joint Swelling.  Neurological Not Present- Headaches, Loss of Consciousness, Numbness, Seizures, Tingling and Weakness.  Psychiatric Not Present- Anxiety, Depression, Memory Loss and Suicidal Ideation.  Endocrine Present- Diabetes. Not Present- Excessive Thirst, Polydipsia, Polyuria and Thyroid Problems.    Medications:  Current Outpatient Prescriptions   Medication Sig   . ACCU-CHEK AVIVA PLUS TEST STRP Strip 1 Strip by Subcutaneous route Three times a day as needed   . Aspirin 81 mg Oral Tablet take 81 mg by mouth Once a day.   . clopidogrel (PLAVIX) 75 mg Oral Tablet take 75 mg by mouth Once a day.     . exenatide microspheres ER (BYDUREON ER) 2 mg 2 mg by Subcutaneous route Every 7 days for 30 days For use after bariatric surgery.   . furosemide (LASIX) 20 mg Oral Tablet take 1 Tab by mouth Twice daily.   Marland Kitchen glimepiride (AMARYL) 4 mg Oral Tablet Take 1 Tab by mouth Once a day   . HYDROcodone-acetaminophen (NORCO) 10-325 mg Oral Tablet Take 1 Tab by mouth Every 4 hours as needed   . lisinopril (PRINIVIL) 2.5 mg Oral Tablet Take 1 Tab by mouth Once a day   . metformin (GLUCOPHAGE) 500 mg Oral Tablet take 500 mg by mouth Twice daily with food.   . metoprolol (LOPRESSOR) 25 mg Oral Tablet  take 12.5 mg by mouth Once per day as needed.     . niacin (NIASPAN) 500 mg Oral Tablet Sustained Release take 500 mg by mouth Once a day.   . Omeprazole (PRILOSEC) 40 mg Oral Capsule, Delayed Release(E.C.) Take 1 Cap by mouth Once a day   . PRENATAL PLUS, CALCIUM  CARB, 27 mg iron- 1 mg Oral Tablet Take 1 Tab by mouth Once a day   . rosuvastatin (CRESTOR) 20 mg Oral Tablet Take 1 Tab by mouth Once a day     Allergies:  Allergies   Allergen Reactions   . No Known Drug Allergies      Past medical history:  Past Medical History:   Diagnosis Date   . Coronary artery disease    . Diabetes mellitus type II    . Ear piercing    . GERD (gastroesophageal reflux disease)    . HTN    . Hyperlipidemia    . Hypertension    . Neck problem     herniated cervical disc   . Obesity    . Stroke ([place])     2002   . Tattoo     Left breast, Right shoulder   . Type 2 diabetes mellitus     Dx 1999 average fasting 70-89     Surgical history:  Past Surgical History:   Procedure Laterality Date   . HX ANKLE FRACTURE TX      Right   . HX CATARACT REMOVAL      r and l eyes with implants   . HX CESAREAN SECTION      x 3   . HX CHOLECYSTECTOMY     . HX TONSILLECTOMY       Family history:  Family Medical History     Problem Relation (Age of Onset)    Diabetes Mother    Heart Attack Mother    Hypertension Mother        Social history:  Social History     Social History   . Marital status: Married     Spouse name: N/A   . Number of children: N/A   . Years of education: N/A     Social History Main Topics   . Smoking status: Former Smoker     Packs/day: 0.25     Years: 20.00     Types: Cigarettes     Quit date: 03/30/2011   . Smokeless tobacco: Never Used   . Alcohol use No   . Drug use: No   . Sexual activity: Not on file     Other Topics Concern   . Routine Exercise No   . Ability To Walk 2 Flight Of Steps Without Sob/Cp Yes     Social History Narrative     Vitals:  Vitals:    02/26/17 1533   BP: 124/80   Pulse: 80   SpO2: 98%   Weight: 127 kg (280 lb)   Height: 1.676 m (5\' 6" )     Body mass index is 45.19 kg/(m^2).     Physical Examination:    General Characteristics  No change in her general appearance.  Overall examination of the patient's skin reveals - no rashes and no suspicious lesions. Color - normal  coloration of skin.  Problem #1 Location - General - Note: upper eyelid w/Xythoma-lipid plaques.   Body Habitus:  Obease-apple like.    Head and Neck  Face Global Assessment - atraumatic.  Eye  Sclera/Conjunctiva - Bilateral-Normal.  Pupil - Bilateral-Normal, Direct reaction to light normal, Equal and Round.    ENMT  Global Assessment  Examination of related systems reveals - trachea is midline, with no palpable neck masses and no palpable thyroid masses or prominent nodules, no thyromegaly.  Ears External Auditory Canal - Bilateral - no cerumen impaction noted. Otoscopic Exam - Tympanic Membrane - Left - clear. Right - clear. External Auditory Meatus - Bilateral - no cerumen impaction noted.    Chest and Lung Exam  Chest and lung exam reveals -normal excursion with symmetric chest walls, quiet, even and easy respiratory effort with no use of accessory muscles and on auscultation, normal breath sounds, no adventitious sounds and normal vocal resonance.  Note: midline chest scar consistent w/ CABG.  Healed. Non-tender. unchanged.     Cardiovascular  Cardiovascular examination reveals -normal heart sounds, regular rate and rhythm with no murmurs, carotid auscultation reveals no bruits and abdominal aorta auscultation reveals no bruits.    Abdomen  Inspection  Contour - Normal.  Palpation/Percussion reveal - Non Tender, No hepatosplenomegaly and No Palpable abdominal masses.  Auscultation  reveals - No Abdominal bruits. Bowel sounds absent - Generalized.    Peripheral Vascular  Upper Extremity Palpation - Radial pulse - Bilateral - Normal.  Lower Extremity Palpation - Edema - Bilateral - No edema.    Neurologic  Mental Status  Affect - normal.  Motor Strength - 5/5 normal muscle strength - Left Lower Extremity and Right Lower Extremity.    Neuropsychiatric  Mental status exam performed with findings of-able to articulate well with normal speech/language, rate, volume and coherence, no evidence of  hallucinations, delusions, obsessions or homicidal/suicidal ideation, demonstrates appropriate judgment and insight and attention span and ability to concentrate are normal.  Mood and affect-remains anxious,  regarding diabetes medications.    Musculoskeletal  Global Assessment  Examination of related systems reveals - no digital clubbing or cyanosis and neurovascularly intact globally with normal deep tendon reflexes. Gait and Station - normal gait and station.    Lymphatic  General Lymphatics  Description - No Localized lymphadenopathy.    LABS:     Results for orders placed or performed during the hospital encounter of 12/11/16 (from the past 2217.6 hour(s))   RAPID INFLUENZA A/B ANTIGEN   Result Value Ref Range    INFLUENZA A ANTIGEN Negative Negative    INFLUENZA B ANTIGEN Negative Negative     Visit Diagnosis    Encounter Diagnoses   Name Primary?   . Diabetes Mellitus, Type II Yes   . Morbid obesity ([place])      No orders of the defined types were placed in this encounter.      Controlled type 2 diabetes mellitus without complication, without long-term current use of insulin (250.00  E11.9)  Impression: Chronic  Controlled.  Continue behavioral efforts, esp  Diabetic diet, < 1800Kcal Q day  Exercise 20 min a day X 6 days a week.  Continue current medications.-until the the night before surgery.  Will need yearly foot exam, retinal exam, microalbumin, LDL.  will need A1C q 3 months.  Last A1C returns 8.8,  I recommend she use her metformin as ordered.  Patient is again scheduled at the end of the day.  Still we spent a significant amount time forming a plan for after surgery.  The patient is not now on insulin  And sliding scale insulin is not an option.    I write down the following plan:    Continue  blood glucose checks b.i.d.    Use byduron this evening.  Observe for effect.  Metformin-use this p.m. dose continue use as previous.  Discontinue Three days prior to surgery.  Glimepiride-discontinue This  evening.  Glyxambi- discontinue this evening.  Continue to check blood glucose after the procedure.  If blood glucose remains greater than 160 a.m. Reading  Then she is to restart her Bydureon 3 days after the procedure.  That should be the  Sunday of next week.  She will call with any confusion.  If we have difficulty with her blood glucose will quickly move to sliding scale insulin.        Morbid obesity due to excess calories (278.01  E66.01)  Impression: Chronic  Uncontrolled.   Dr. Lianne Moris planning surgery next week.  She will follow through on his recommendations.  She has had preop workup.    Return in about 3 months (around 05/29/2017), or if symptoms worsen or fail to improve, for Obesity, .      Cherly Beach, MD  02/26/2017, 16:01

## 2017-02-26 NOTE — Patient Instructions (Signed)
Diet: Diabetes  Food is an important tool that you can use to control diabetes and stay healthy. Eating well-balanced meals in the correct amounts will help you control your blood glucose levels and prevent low blood sugar reactions. It will also help you reduce the health risks of diabetes. There is no one specific diet that is right for everyone with diabetes. But there are general guidelines to follow. A registered dietitian (RD) will create a tailored diet approach that's just right for you. He or she will also help you plan healthy meals and snacks. If you have any questions, call your dietitian for advice.    Guidelines for success  Talk with your healthcare provider before starting a diabetes diet or weight loss program. If you haven't talked with a dietitian yet, ask your provider for a referral. The following guidelines can help you succeed:   Select foods from the 6 food groups below. Your dietitian will help you find food choices within each group. He or she will also show you serving sizes and how many servings you can have at each meal.   Grains, beans, and starchy vegetables   Vegetables   Fruit   Milk or yogurt   Meat, poultry, fish, or tofu   Healthy fats   Check your blood sugar levels as directed by your provider. Take any medicine as prescribed by your provider.   Learn to read food labels and pick the right portion sizes.   Eat only the amount of food in your meal plan. Eat about the same amount of food at regular times each day. Don't skip meals. Eat meals 4 to 5 hours apart, with snacks in between.   Limit alcohol. It raises blood sugar levels. Drink water or calorie-free diet drinks that use safe sweeteners.   Eat less fat to help lower your risk of heart disease. Use nonfat or low-fat dairy products and lean meats. Avoid fried foods. Use cooking oils that are unsaturated, such as olive, canola, or peanut oil.   Talk with your dietitian about safe sugar substitutes.   Avoid  added salt. It can contribute to high blood pressure, which can cause heart disease. People with diabetes already have a risk of high blood pressure and heart disease.   Stay at a healthy weight. If you need to lose weight, cut down on your portion sizes. But don't skip meals. Exercise is an important part of any weight management program. Talk with your provider about an exercise program that's right for you.   For more information about the best diet plan for you, talk with a registered dietitian (RD). To find an RD in your area, contact:   Academy of [employer] and Dietetics www.eatright.org   The American Diabetes Association [phone] www.diabetes.org  Date Last Reviewed: 07/09/2015   2000-2017 [employer]. [address] [place] [address]. All rights reserved. This information is not intended as a substitute for professional medical care. Always follow your healthcare professional's instructions.        Understanding Carbohydrates, Fats, and Protein  Food is a source of fuel and nourishment for your body. It's also a source of pleasure. Having diabetes doesn't mean you have to eat special foods or give up desserts. Instead, your dietitian can show you how to plan meals to suit your body. To start, learn how different foods affect blood sugar.  Carbohydrates  Carbohydrates are the main source of fuel for the body. Carbohydrates raise blood sugar. Many  people think carbohydrates are only found in pasta or bread. But carbohydrates are actually in many kinds of foods:   Sugars occur naturally in foods such as fruit, milk, honey, and molasses. Sugars can also be added to many foods, from cereals and yogurt to candy and desserts. Sugars raise blood sugar.   Starchesare found in bread, cereals, pasta, and dried beans. They're also found in corn, peas, potatoes, yam, acorn squash, and butternut squash. Starches also raise blood sugar.   Fiberis found in foods such as vegetables,  fruits, beans, and whole grains. Unlike other carbs, fiber isn't digested or absorbed. So it doesn't raise blood sugar. In fact, fiber can help keep blood sugar from rising too fast. It also helps keep blood cholesterol at a healthy level.  Did you know?  Even though carbohydrates raise blood sugar, it's best to have some in every meal. They are an important part of a healthy diet.   Fat  Fat is an energy source that can be stored until needed. Fat does not raise blood sugar. However, it can raise blood cholesterol, increasing the risk of heart disease. Fat is also high in calories, which can cause weight gain. Not all types of fat are the same.  More Healthy:   Monounsaturated fatsare mostly found in vegetable oils, such as olive, canola, and peanut oils. They are also found in avocados and some nuts. Monounsaturated fats are healthy for your heart. That's because they lower LDL (unhealthy) cholesterol.   Polyunsaturated fats are mostly found in vegetable oils, such as corn, safflower, and soybean oils. They are also found in some seeds, nuts, and fish. Polyunsaturated fats lower LDL (unhealthy) cholesterol. So, choosing them instead of saturated fats is healthy for your heart. Certain unsaturated fats can help lower triglycerides.  Less Healthy:   Saturated fats are found in animal products, such as meat, poultry, whole milk, lard, and butter.Saturated fats raise LDL cholesterol and are not healthy for your heart.   Hydrogenated oils and trans fats are formed when vegetable oils are processed into solid fats. They are found in many processed foods. Hydrogenated oils and trans fats raise LDL cholesterol and lower HDL (healthy) cholesterol. They are not healthy for your heart.  Protein  Protein helps the body build and repair muscle and other tissue. Protein has little or no effect on blood sugar. However, many foods that contain protein also contain saturated fat. By choosing low-fat protein sources, you can  get the benefits of protein without the extra fat:   Plant protein is found in dry beans and peas, nuts, and soy products, such as tofu and soymilk. These sources tend to be cholesterol-free and low in saturated fat.   Animal protein is found in fish, poultry, meat, cheese, milk, and eggs. These contain cholesterol and can be high in saturated fat. Aim for lean, lower-fat choices.  Date Last Reviewed: 02/06/2015   2000-2017 [employer]. [address], [place] [address]. All rights reserved. This information is not intended as a substitute for professional medical care. Always follow your healthcare professional's instructions.

## 2017-03-11 ENCOUNTER — Encounter (HOSPITAL_BASED_OUTPATIENT_CLINIC_OR_DEPARTMENT_OTHER): Payer: Self-pay

## 2017-03-11 ENCOUNTER — Ambulatory Visit (INDEPENDENT_AMBULATORY_CARE_PROVIDER_SITE_OTHER): Payer: Commercial Managed Care - PPO

## 2017-03-11 VITALS — Wt 271.0 lb

## 2017-03-11 DIAGNOSIS — Z9884 Bariatric surgery status: Principal | ICD-10-CM

## 2017-03-18 ENCOUNTER — Ambulatory Visit: Payer: Commercial Managed Care - PPO | Attending: Family Medicine

## 2017-03-18 ENCOUNTER — Ambulatory Visit (INDEPENDENT_AMBULATORY_CARE_PROVIDER_SITE_OTHER): Payer: Commercial Managed Care - PPO | Admitting: Family Medicine

## 2017-03-18 ENCOUNTER — Encounter (HOSPITAL_BASED_OUTPATIENT_CLINIC_OR_DEPARTMENT_OTHER): Payer: Commercial Managed Care - PPO | Admitting: Family Medicine

## 2017-03-18 VITALS — BP 130/84 | HR 111 | Ht 66.0 in | Wt 262.0 lb

## 2017-03-18 DIAGNOSIS — E119 Type 2 diabetes mellitus without complications: Secondary | ICD-10-CM

## 2017-03-18 DIAGNOSIS — E669 Obesity, unspecified: Secondary | ICD-10-CM

## 2017-03-18 DIAGNOSIS — Z6841 Body Mass Index (BMI) 40.0 and over, adult: Secondary | ICD-10-CM

## 2017-03-18 DIAGNOSIS — R824 Acetonuria: Secondary | ICD-10-CM

## 2017-03-18 LAB — BASIC METABOLIC PANEL
ANION GAP: 14 mmol/L
BUN/CREA RATIO: 23
BUN: 27 mg/dL — ABNORMAL HIGH (ref 7–18)
CALCIUM: 11.3 mg/dL — ABNORMAL HIGH (ref 8.5–10.3)
CHLORIDE: 93 mmol/L — ABNORMAL LOW (ref 101–111)
CO2 TOTAL: 32 mmol/L — ABNORMAL HIGH (ref 22–31)
CREATININE: 1.2 mg/dL (ref 0.50–1.20)
ESTIMATED GFR: 47 mL/min/1.73mˆ2 — ABNORMAL LOW (ref >=60)
GLUCOSE: 240 mg/dL — ABNORMAL HIGH (ref 68–99)
POTASSIUM: 4.2 mmol/L (ref 3.6–5.0)
SODIUM: 139 mmol/L (ref 137–145)
SODIUM: 139 mmol/L (ref 137–145)

## 2017-03-18 LAB — POCT HGB A1C: POCT HGB A1C: 9.4 % — AB (ref 4–6)

## 2017-03-18 LAB — BETA-HYDROXYBUTYRATE: BETA-HYDROXYBUTYRATE: 2.4 mmol/L — ABNORMAL HIGH (ref <0.27)

## 2017-03-18 NOTE — Progress Notes (Signed)
Patient Name: Monique [name]  Date: 03/18/2017  [facility]  [address]  [place] [address]  [phone]  MRN: [id number]  DOB: 1966/11/20    Chief complaint: Diabetes follow-up    HPI: The patient is a 51 y.o. old female who came in today for     Obesity:  Symptoms include excess weight, weight increase, inability to lose weight and fatigue, while symptoms do not include dyspnea, excessive sweating, edema, knee pain, back pain or joint pain. Symptoms are not exacerbated by binge eating, exercise intolerance, sugar cravings or social isolation. Symptoms are not relieved by low calorie diet, exercise, adequate water, support activities or preparing foods. Associated symptoms do not include depressed mood, anxiety, sexual dysfunction, poor self esteem, cystic skin lesions, tinea corpora, impaired range of motion, dark skin lesions or skin infections. The patient had weight loss surgery 03/05/17, here today for follow up after surgery.    She has had no significant complications., she denies any shortness of breath, any dysuria.   An any complaint of leg pain. She is ambulatory.   She is attempting compliance with diet with some success.   She has follow-up scheduled.        Medications:  Current Outpatient Prescriptions   Medication Sig    ACCU-CHEK AVIVA PLUS TEST STRP Strip 1 Strip by Subcutaneous route Three times a day as needed    furosemide (LASIX) 40 mg Oral Tablet Take 40 mg by mouth Once a day    HYDROcodone-acetaminophen (NORCO) 10-325 mg Oral Tablet Take 1 Tab by mouth Every 4 hours as needed    lisinopril (PRINIVIL) 2.5 mg Oral Tablet Take 1 Tab by mouth Once a day    metformin (GLUCOPHAGE) 500 mg Oral Tablet take 500 mg by mouth Twice daily with food.    metoprolol (LOPRESSOR) 25 mg Oral Tablet take 12.5 mg by mouth Once per day as needed.      niacin (NIASPAN) 500 mg Oral Tablet Sustained Release take 500 mg by mouth Once a day.    Omeprazole  (PRILOSEC) 40 mg Oral Capsule, Delayed Release(E.C.) Take 1 Cap by mouth Once a day    PRENATAL PLUS, CALCIUM CARB, 27 mg iron- 1 mg Oral Tablet Take 1 Tab by mouth Once a day    rosuvastatin (CRESTOR) 20 mg Oral Tablet Take 1 Tab by mouth Once a day     Allergies:  Allergies   Allergen Reactions    No Known Drug Allergies      Past medical history:  Past Medical History:   Diagnosis Date    Coronary artery disease     Diabetes mellitus type II     Ear piercing     GERD (gastroesophageal reflux disease)     HTN     Hyperlipidemia     Hypertension     Neck problem     herniated cervical disc    Obesity     Stroke [facility])     2002    Tattoo     Left breast, Right shoulder    Type 2 diabetes mellitus     Dx 1999 average fasting 70-89     Surgical history:  Past Surgical History:   Procedure Laterality Date    HX ANKLE FRACTURE TX      Right    HX CATARACT REMOVAL      r and l eyes with implants    HX CESAREAN SECTION  x 3    HX CHOLECYSTECTOMY      HX TONSILLECTOMY       Family history:  Family Medical History     Problem Relation (Age of Onset)    Diabetes Mother    Heart Attack Mother    Hypertension Mother        Social history:  Social History     Social History    Marital status: Married     Spouse name: N/A    Number of children: N/A    Years of education: N/A     Social History Main Topics    Smoking status: Former Smoker     Packs/day: 0.25     Years: 20.00     Types: Cigarettes     Quit date: 03/30/2011    Smokeless tobacco: Never Used    Alcohol use No    Drug use: No    Sexual activity: Not on file     Other Topics Concern    Routine Exercise No    Ability To Walk 2 Flight Of Steps Without Sob/Cp Yes     Social History Narrative     Vitals:  Vitals:    03/18/17 0808   BP: 130/84   Pulse: (!) 111   SpO2: 96%   Weight: 118.8 kg (262 lb)   Height: 1.676 m (5\' 6" )     Body mass index is 42.29 kg/(m^2).     Physical Examination:    General Characteristics  No change in her general  appearance.  Overall examination of the patient's skin reveals - no rashes and no suspicious lesions. Color - normal coloration of skin.  Problem #1 Location - General - Note: upper eyelid w/Xythoma-lipid plaques.   Body Habitus:  Obease-apple like.    Head and Neck  Face Global Assessment - atraumatic.  Eye  Sclera/Conjunctiva - Bilateral-Normal.  Pupil - Bilateral-Normal, Direct reaction to light normal, Equal and Round.    ENMT  Global Assessment  Examination of related systems reveals - trachea is midline, with no palpable neck masses and no palpable thyroid masses or prominent nodules, no thyromegaly.  Ears External Auditory Canal - Bilateral - no cerumen impaction noted. Otoscopic Exam - Tympanic Membrane - Left - clear. Right - clear. External Auditory Meatus - Bilateral - no cerumen impaction noted.    Chest and Lung Exam  Chest and lung exam reveals -normal excursion with symmetric chest walls, quiet, even and easy respiratory effort with no use of accessory muscles and on auscultation, normal breath sounds, no adventitious sounds and normal vocal resonance.  Note: midline chest scar consistent w/ CABG.  Healed. Non-tender. unchanged.     Cardiovascular  Cardiovascular examination reveals -normal heart sounds, regular rate and rhythm with no murmurs, carotid auscultation reveals no bruits and abdominal aorta auscultation reveals no bruits.    Abdomen  Inspection-incisions are clean dry and intact there is no distension no drainage.  Contour - Normal.  Palpation/Percussion reveal - minimally Tender, No hepatosplenomegaly and No Palpable abdominal masses.  Auscultation  reveals - No Abdominal bruits. Bowel sounds absent - Generalized.    Peripheral Vascular  Upper Extremity Palpation - Radial pulse - Bilateral - Normal.  Lower Extremity Palpation - Edema - Bilateral - No edema.    Neurologic  Mental Status  Affect - normal.  Motor Strength - 5/5 normal muscle strength - Left Lower Extremity and Right  Lower Extremity.    Neuropsychiatric    Mood and affect-Normal.  Musculoskeletal  Global Assessment  Examination of related systems reveals - no digital clubbing or cyanosis and neurovascularly intact globally with normal deep tendon reflexes. Gait and Station - normal gait and station.      LABS:     CBC  Diff   Lab Results   Component Value Date/Time    WBC 11.1 (H) 10/23/2016 12:16 PM    WBC 14.0 (H) 04/16/2011 08:22 AM    HGB 14.8 10/23/2016 12:16 PM    HGB 10.3 (L) 04/16/2011 08:22 AM    HCT 45.0 10/23/2016 12:16 PM    HCT 32.3 (L) 04/16/2011 08:22 AM    PLTCNT 382 10/23/2016 12:16 PM    PLTCNT 1197 (H) 04/16/2011 08:22 AM    RBC 5.04 10/23/2016 12:16 PM    RBC 3.55 (L) 04/16/2011 08:22 AM    MCV 89.3 10/23/2016 12:16 PM    MCV 90.9 04/16/2011 08:22 AM    MCHC 32.9 (L) 10/23/2016 12:16 PM    MCHC 31.9 04/16/2011 08:22 AM    MCH 29.4 10/23/2016 12:16 PM    MCH 29.0 04/16/2011 08:22 AM    RDW 14.4 10/23/2016 12:16 PM    RDW 14.5 (H) 04/16/2011 08:22 AM    MPV 5.6 (L) 04/16/2011 08:22 AM    Lab Results   Component Value Date/Time    PMNS 84 (H) 04/01/2011 05:08 AM    LYMPHOCYTES 8 (L) 04/01/2011 05:08 AM    EOSINOPHIL 0 (L) 04/01/2011 05:08 AM    MONOCYTES 8 04/01/2011 05:08 AM    BASOPHILS 0 04/01/2011 05:08 AM    PMNABS 13.200 (H) 04/01/2011 05:08 AM    LYMPHSABS 1.210 04/01/2011 05:08 AM    EOSABS 0.018 (L) 04/01/2011 05:08 AM    MONOSABS 1.210 (H) 04/01/2011 05:08 AM    BASOSABS 0.034 04/01/2011 05:08 AM          CBC  Diff   Lab Results   Component Value Date/Time    WBC 11.1 (H) 10/23/2016 12:16 PM    WBC 14.0 (H) 04/16/2011 08:22 AM    HGB 14.8 10/23/2016 12:16 PM    HGB 10.3 (L) 04/16/2011 08:22 AM    HCT 45.0 10/23/2016 12:16 PM    HCT 32.3 (L) 04/16/2011 08:22 AM    PLTCNT 382 10/23/2016 12:16 PM    PLTCNT 1197 (H) 04/16/2011 08:22 AM    RBC 5.04 10/23/2016 12:16 PM    RBC 3.55 (L) 04/16/2011 08:22 AM    MCV 89.3 10/23/2016 12:16 PM    MCV 90.9 04/16/2011 08:22 AM    MCHC 32.9 (L) 10/23/2016 12:16 PM     MCHC 31.9 04/16/2011 08:22 AM    MCH 29.4 10/23/2016 12:16 PM    MCH 29.0 04/16/2011 08:22 AM    RDW 14.4 10/23/2016 12:16 PM    RDW 14.5 (H) 04/16/2011 08:22 AM    MPV 5.6 (L) 04/16/2011 08:22 AM    Lab Results   Component Value Date/Time    PMNS 84 (H) 04/01/2011 05:08 AM    LYMPHOCYTES 8 (L) 04/01/2011 05:08 AM    EOSINOPHIL 0 (L) 04/01/2011 05:08 AM    MONOCYTES 8 04/01/2011 05:08 AM    BASOPHILS 0 04/01/2011 05:08 AM    PMNABS 13.200 (H) 04/01/2011 05:08 AM    LYMPHSABS 1.210 04/01/2011 05:08 AM    EOSABS 0.018 (L) 04/01/2011 05:08 AM    MONOSABS 1.210 (H) 04/01/2011 05:08 AM    BASOSABS 0.034 04/01/2011 05:08 AM        Comprehensive Metabolic  Profile    Lab Results   Component Value Date/Time    SODIUM 143 10/23/2016 12:16 PM    SODIUM 137 04/16/2011 08:22 AM    POTASSIUM 4.6 10/23/2016 12:16 PM    POTASSIUM 4.1 04/16/2011 08:22 AM    CHLORIDE 107 10/23/2016 12:16 PM    CHLORIDE 95 (L) 04/16/2011 08:22 AM    CO2 23 10/23/2016 12:16 PM    CO2 32 04/16/2011 08:22 AM    ANIONGAP 13 10/23/2016 12:16 PM    ANIONGAP 10 04/16/2011 08:22 AM    BUN 16 10/23/2016 12:16 PM    BUN 13 04/16/2011 08:22 AM    CREATININE 1.00 10/23/2016 12:16 PM    CREATININE 1.08 04/16/2011 08:22 AM    GLUCOSE 149 (H) 03/31/2011 05:00 PM    GLUCOSE NEGATIVE 03/24/2011 06:05 PM    Lab Results   Component Value Date/Time    CALCIUM 9.7 10/23/2016 12:16 PM    CALCIUM 8.7 04/16/2011 08:22 AM    ALBUMIN 3.9 10/23/2016 12:16 PM    TOTALPROTEIN 6.8 10/23/2016 12:16 PM    ALKPHOS 109 10/23/2016 12:16 PM    AST 22 10/23/2016 12:16 PM    ALT 29 10/23/2016 12:16 PM    TOTBILIRUBIN 0.4 10/23/2016 12:16 PM            Visit Diagnosis    Encounter Diagnoses   Name Primary?    Obesity Yes    Diabetes ([place])     Ketonuria      Orders Placed This Encounter    BETA-HYDROXYBUTYRATE, PLASMA OR SERUM    BASIC METABOLIC PANEL    POCT HGB A1C    POCT URINE DIPSTICK       Morbid obesity due to excess calories (278.01   E66.01)  Impression:  Chronic  Uncontrolled. She has lost 20#s.  Status post procedure with Dr. Bridgett Larsson  She will follow through on his recommendations.  Keep follow-up appointment.  Follow-up Labs will be ordered by the surgeon.  Call should any post-op complications developed including fever, chills, shortness of breath, and dysuria.    Pt has been told to use Metformin on a sliding scale.  We review her Diary and no BG > 200 is noted.   With each use of metformin she wants to vomit.   I ask her to stop this altogether.   A1C 9.2 down from last encoutner.     Labs as above.     Return in about 2 months (around 05/18/2017), or if symptoms worsen or fail to improve, for Diabetes mellitus, obesity.    Cherly Beach, MD  03/18/2017, 08:33

## 2017-03-18 NOTE — Nursing Note (Signed)
03/18/17 0800   A1C   A1C 9.4   Initials CV

## 2017-03-19 ENCOUNTER — Ambulatory Visit (INDEPENDENT_AMBULATORY_CARE_PROVIDER_SITE_OTHER): Payer: Self-pay | Admitting: Family Medicine

## 2017-03-19 NOTE — Telephone Encounter (Signed)
Regarding: Dr. Russ Halo  ----- Message from [place] sent at 03/19/2017  2:38 PM EDT -----  Pt called in to get her test results.  Call back number is [phone]

## 2017-03-31 ENCOUNTER — Ambulatory Visit (HOSPITAL_BASED_OUTPATIENT_CLINIC_OR_DEPARTMENT_OTHER): Payer: Commercial Managed Care - PPO

## 2017-04-02 ENCOUNTER — Ambulatory Visit (HOSPITAL_BASED_OUTPATIENT_CLINIC_OR_DEPARTMENT_OTHER): Payer: Commercial Managed Care - PPO | Admitting: Family Medicine

## 2017-04-16 ENCOUNTER — Other Ambulatory Visit (HOSPITAL_BASED_OUTPATIENT_CLINIC_OR_DEPARTMENT_OTHER): Payer: Self-pay | Admitting: Family Medicine

## 2017-04-16 ENCOUNTER — Ambulatory Visit (INDEPENDENT_AMBULATORY_CARE_PROVIDER_SITE_OTHER): Payer: Commercial Managed Care - PPO

## 2017-04-16 VITALS — Wt 254.0 lb

## 2017-04-16 DIAGNOSIS — Z048 Encounter for examination and observation for other specified reasons: Secondary | ICD-10-CM

## 2017-04-16 DIAGNOSIS — K219 Gastro-esophageal reflux disease without esophagitis: Secondary | ICD-10-CM

## 2017-04-16 DIAGNOSIS — E669 Obesity, unspecified: Secondary | ICD-10-CM

## 2017-04-28 ENCOUNTER — Other Ambulatory Visit (INDEPENDENT_AMBULATORY_CARE_PROVIDER_SITE_OTHER): Payer: Self-pay | Admitting: Cardiovascular Disease

## 2017-04-29 MED ORDER — LISINOPRIL 2.5 MG TABLET
ORAL_TABLET | ORAL | 3 refills | Status: DC
Start: 2017-04-29 — End: 2018-06-03

## 2017-04-29 MED ORDER — METOPROLOL TARTRATE 25 MG TABLET
ORAL_TABLET | ORAL | 3 refills | Status: DC
Start: 2017-04-29 — End: 2017-08-27

## 2017-04-30 ENCOUNTER — Other Ambulatory Visit (HOSPITAL_BASED_OUTPATIENT_CLINIC_OR_DEPARTMENT_OTHER): Payer: Self-pay

## 2017-05-01 ENCOUNTER — Ambulatory Visit (INDEPENDENT_AMBULATORY_CARE_PROVIDER_SITE_OTHER): Payer: Commercial Managed Care - PPO

## 2017-05-01 VITALS — Wt 246.8 lb

## 2017-05-01 DIAGNOSIS — E669 Obesity, unspecified: Secondary | ICD-10-CM

## 2017-05-19 ENCOUNTER — Ambulatory Visit (INDEPENDENT_AMBULATORY_CARE_PROVIDER_SITE_OTHER): Payer: Commercial Managed Care - PPO | Admitting: Family Medicine

## 2017-05-19 ENCOUNTER — Other Ambulatory Visit (INDEPENDENT_AMBULATORY_CARE_PROVIDER_SITE_OTHER): Payer: Self-pay | Admitting: Cardiovascular Disease

## 2017-05-19 ENCOUNTER — Encounter (HOSPITAL_BASED_OUTPATIENT_CLINIC_OR_DEPARTMENT_OTHER): Payer: Self-pay | Admitting: Family Medicine

## 2017-05-19 VITALS — BP 130/86 | HR 89 | Ht 66.0 in | Wt 242.0 lb

## 2017-05-19 DIAGNOSIS — E118 Type 2 diabetes mellitus with unspecified complications: Secondary | ICD-10-CM

## 2017-05-19 DIAGNOSIS — Z6839 Body mass index (BMI) 39.0-39.9, adult: Secondary | ICD-10-CM

## 2017-05-19 DIAGNOSIS — E669 Obesity, unspecified: Secondary | ICD-10-CM

## 2017-05-19 MED ORDER — NIACIN ER 500 MG TABLET,EXTENDED RELEASE
500.0000 mg | ORAL_TABLET | Freq: Every day | ORAL | 3 refills | Status: DC
Start: 2017-05-19 — End: 2018-07-13

## 2017-05-19 MED ORDER — CLOPIDOGREL 75 MG TABLET
75.0000 mg | ORAL_TABLET | Freq: Every day | ORAL | 3 refills | Status: DC
Start: 2017-05-19 — End: 2018-10-19

## 2017-05-19 MED ORDER — ROSUVASTATIN 20 MG TABLET
20.0000 mg | ORAL_TABLET | Freq: Every day | ORAL | 3 refills | Status: DC
Start: 2017-05-19 — End: 2018-06-03

## 2017-05-19 MED ORDER — EXENATIDE ER 2 MG SUBCUTANEOUS EXTENDED RELEASE SUSPENSION
2.0000 mg | SUBCUTANEOUS | 5 refills | Status: AC
Start: 2017-05-19 — End: 2017-06-18

## 2017-05-19 MED ORDER — FUROSEMIDE 40 MG TABLET
40.0000 mg | ORAL_TABLET | Freq: Every day | ORAL | 3 refills | Status: DC
Start: 2017-05-19 — End: 2018-10-18

## 2017-05-19 MED ORDER — PRENATAL PLUS (CALCIUM CARBONATE) 27 MG IRON-1 MG TABLET
1.0000 | ORAL_TABLET | Freq: Every day | ORAL | 3 refills | Status: DC
Start: 2017-05-19 — End: 2018-10-19

## 2017-05-19 NOTE — Progress Notes (Signed)
Patient Name: Monique [name]  Date: 05/19/2017  [facility]  [address]  [place] [address]  [phone]  MRN: [id number]  DOB: [birth year]    Chief complaint: Diabetes follow-up    HPI: The patient is a 51 y.o. old female who came in today for     Diabetes:  Symptoms include irritability and dizziness, while symptoms do not include polydipsia, polyuria, polyphagia, weight loss, fatigue, blurred vision, somnolence or sweating. Symptom onset was gradual years ago. There is no known event that preceded symptom onset. The symptoms occur constantly. The episodes occur daily and last for days. This is described as moderate in severity and worsening. Symptoms are exacerbated by stress, skipping insulin and missing meals. Symptoms are exacerbated by stress, skipping insulin and missing meals. Symptoms are relieved by following diet, while symptoms are not relieved by regular exercise, stress management or taking insulin. Associated symptoms include nausea, but do not include excessive hunger, vomiting, anorexia, abdominal pain, fruity breath, amenorrhea or seizures. Last A1C 9.4 on 4/11/1. Pt states she doesn't checks BS daily.;    Obesity:  Symptoms include excess weight, weight increase, inability to lose weight and fatigue, while symptoms do not include dyspnea, excessive sweating, edema, knee pain, back pain or joint pain. Symptoms are not exacerbated by binge eating, exercise intolerance, sugar cravings or social isolation. Symptoms are not relieved by low calorie diet, exercise, adequate water, support activities or preparing foods. Associated symptoms do not include depressed mood, anxiety, sexual dysfunction, poor self esteem,  cystic skin lesions, tinea corpora, impaired range of motion, dark skin lesions or skin infections. Had weight loss surgery 03/05/17. Pt now weighs 242.       Medications:  Current Outpatient Prescriptions   Medication Sig   . ACCU-CHEK  AVIVA PLUS TEST STRP Strip 1 Strip by Subcutaneous route Three times a day as needed   . clopidogrel (PLAVIX) 75 mg Oral Tablet Take 75 mg by mouth Once a day   . exenatide microspheres ER (BYDUREON ER) 2 mg 2 mg by Subcutaneous route Every 7 days for 30 days For use after bariatric surgery.   . furosemide (LASIX) 40 mg Oral Tablet Take 40 mg by mouth Once a day   . HYDROcodone-acetaminophen (NORCO) 10-325 mg Oral Tablet Take 1 Tab by mouth Every 4 hours as needed   . lisinopril (PRINIVIL) 2.5 mg Oral Tablet take 1 tablet by mouth once daily   . metoprolol (LOPRESSOR) 25 mg Oral Tablet take 1 tablet by mouth once daily   . niacin (NIASPAN) 500 mg Oral Tablet Sustained Release take 500 mg by mouth Once a day.   Marland Kitchen omeprazole (PRILOSEC) 40 mg Oral Capsule, Delayed Release(E.C.) take 1 capsule by mouth once daily   . ondansetron (ZOFRAN ODT) 8 mg Oral Tablet, Rapid Dissolve take 1 tablet every 8 hours if needed   . PRENATAL PLUS, CALCIUM CARB, 27 mg iron- 1 mg Oral Tablet Take 1 Tab by mouth Once a day   . rosuvastatin (CRESTOR) 20 mg Oral Tablet Take 1 Tab by mouth Once a day     Allergies:  Allergies   Allergen Reactions   . No Known Drug Allergies      Past medical history:  Past Medical History:   Diagnosis Date   . Coronary artery disease    . Diabetes mellitus type II    . Ear piercing    . GERD (gastroesophageal reflux disease)    . HTN    .  Hyperlipidemia    . Hypertension    . Neck problem     herniated cervical disc   . Obesity    . Stroke ([place])     2002   . Tattoo     Left breast, Right shoulder   . Type 2 diabetes mellitus     Dx 1999 average fasting 70-89     Surgical history:  Past Surgical History:   Procedure Laterality Date   . HX ANKLE FRACTURE TX      Right   . HX CATARACT REMOVAL      r and l eyes with implants   . HX CESAREAN SECTION      x 3   . HX CHOLECYSTECTOMY     . HX TONSILLECTOMY       Family history:  Family Medical History     Problem Relation (Age of Onset)    Diabetes Mother    Heart  Attack Mother    Hypertension Mother        Social history:  Social History     Social History   . Marital status: Married     Spouse name: N/A   . Number of children: N/A   . Years of education: N/A     Social History Main Topics   . Smoking status: Current Every Day Smoker     Packs/day: 0.25     Years: 20.00     Types: Cigarettes     Last attempt to quit: 03/30/2011   . Smokeless tobacco: Never Used   . Alcohol use No   . Drug use: No   . Sexual activity: Not on file     Other Topics Concern   . Routine Exercise No   . Ability To Walk 2 Flight Of Steps Without Sob/Cp Yes     Social History Narrative     Vitals:  Vitals:    05/19/17 0824   BP: 130/86   Pulse: 89   SpO2: 96%   Weight: 109.8 kg (242 lb)   Height: 1.676 m (5\' 6" )     Body mass index is 39.06 kg/(m^2).     Physical Examination:    General Characteristics  No change in her general appearance.  Overall examination of the patient's skin reveals - no rashes and no suspicious lesions. Color - normal coloration of skin.  Problem #1 Location - General - Note: upper eyelid w/Xythoma-lipid plaques. Stable.   Body Habitus:  Obease    Head and Neck  Face Global Assessment - atraumatic.  Eye-Sclera/Conjunctiva - Bilateral-Normal.  Pupil - Bilateral-Normal, Direct reaction to light normal, Equal and Round.    ENMT  Global Assessment  Examination of related systems reveals - trachea is midline, with no palpable neck masses and no palpable thyroid masses or prominent nodules, no thyromegaly.  Ears External Auditory Canal - Bilateral - no cerumen impaction noted. Otoscopic Exam - Tympanic Membrane - Left - clear. Right - clear. External Auditory Meatus - Bilateral - no cerumen impaction noted.    Chest and Lung Exam  Chest and lung exam reveals -normal excursion with symmetric chest walls, quiet, even and easy respiratory effort with no use of accessory muscles and on auscultation, normal breath sounds, no adventitious sounds and normal vocal resonance.  Note:  midline chest scar consistent w/ CABG.  Healed. Non-tender. unchanged.     Cardiovascular  Cardiovascular examination reveals -normal heart sounds, regular rate and rhythm with no murmurs, carotid auscultation reveals no bruits  and abdominal aorta auscultation reveals no bruits.    Abdomen  Inspection-incisions are healed.   Contour - Normal.  Palpation/Percussion reveal - minimally Tender, No hepatosplenomegaly and No Palpable abdominal masses.  Auscultation  reveals - No Abdominal bruits. Bowel sounds absent - Generalized.    Peripheral Vascular  Upper Extremity Palpation - Radial pulse - Bilateral - Normal.  Lower Extremity Palpation - Edema - Bilateral - No edema.    Neurologic  Mental Status  Affect - normal.  Motor Strength - 5/5 normal muscle strength - Left Lower Extremity and Right Lower Extremity.    Neuropsychiatric    Mood and affect-Normal.       Musculoskeletal  Global Assessment  Examination of related systems reveals - no digital clubbing or cyanosis and neurovascularly intact globally with normal deep tendon reflexes. Gait and Station - normal gait and station.      LABS:     CBC  Diff   Lab Results   Component Value Date/Time    WBC 11.1 (H) 10/23/2016 12:16 PM    WBC 14.0 (H) 04/16/2011 08:22 AM    HGB 14.8 10/23/2016 12:16 PM    HGB 10.3 (L) 04/16/2011 08:22 AM    HCT 45.0 10/23/2016 12:16 PM    HCT 32.3 (L) 04/16/2011 08:22 AM    PLTCNT 382 10/23/2016 12:16 PM    PLTCNT 1197 (H) 04/16/2011 08:22 AM    RBC 5.04 10/23/2016 12:16 PM    RBC 3.55 (L) 04/16/2011 08:22 AM    MCV 89.3 10/23/2016 12:16 PM    MCV 90.9 04/16/2011 08:22 AM    MCHC 32.9 (L) 10/23/2016 12:16 PM    MCHC 31.9 04/16/2011 08:22 AM    MCH 29.4 10/23/2016 12:16 PM    MCH 29.0 04/16/2011 08:22 AM    RDW 14.4 10/23/2016 12:16 PM    RDW 14.5 (H) 04/16/2011 08:22 AM    MPV 5.6 (L) 04/16/2011 08:22 AM    Lab Results   Component Value Date/Time    PMNS 84 (H) 04/01/2011 05:08 AM    LYMPHOCYTES 8 (L) 04/01/2011 05:08 AM    EOSINOPHIL 0  (L) 04/01/2011 05:08 AM    MONOCYTES 8 04/01/2011 05:08 AM    BASOPHILS 0 04/01/2011 05:08 AM    PMNABS 13.200 (H) 04/01/2011 05:08 AM    LYMPHSABS 1.210 04/01/2011 05:08 AM    EOSABS 0.018 (L) 04/01/2011 05:08 AM    MONOSABS 1.210 (H) 04/01/2011 05:08 AM    BASOSABS 0.034 04/01/2011 05:08 AM          CBC  Diff   Lab Results   Component Value Date/Time    WBC 11.1 (H) 10/23/2016 12:16 PM    WBC 14.0 (H) 04/16/2011 08:22 AM    HGB 14.8 10/23/2016 12:16 PM    HGB 10.3 (L) 04/16/2011 08:22 AM    HCT 45.0 10/23/2016 12:16 PM    HCT 32.3 (L) 04/16/2011 08:22 AM    PLTCNT 382 10/23/2016 12:16 PM    PLTCNT 1197 (H) 04/16/2011 08:22 AM    RBC 5.04 10/23/2016 12:16 PM    RBC 3.55 (L) 04/16/2011 08:22 AM    MCV 89.3 10/23/2016 12:16 PM    MCV 90.9 04/16/2011 08:22 AM    MCHC 32.9 (L) 10/23/2016 12:16 PM    MCHC 31.9 04/16/2011 08:22 AM    MCH 29.4 10/23/2016 12:16 PM    MCH 29.0 04/16/2011 08:22 AM    RDW 14.4 10/23/2016 12:16 PM    RDW 14.5 (H) 04/16/2011  08:22 AM    MPV 5.6 (L) 04/16/2011 08:22 AM    Lab Results   Component Value Date/Time    PMNS 84 (H) 04/01/2011 05:08 AM    LYMPHOCYTES 8 (L) 04/01/2011 05:08 AM    EOSINOPHIL 0 (L) 04/01/2011 05:08 AM    MONOCYTES 8 04/01/2011 05:08 AM    BASOPHILS 0 04/01/2011 05:08 AM    PMNABS 13.200 (H) 04/01/2011 05:08 AM    LYMPHSABS 1.210 04/01/2011 05:08 AM    EOSABS 0.018 (L) 04/01/2011 05:08 AM    MONOSABS 1.210 (H) 04/01/2011 05:08 AM    BASOSABS 0.034 04/01/2011 05:08 AM        Comprehensive Metabolic Profile    Lab Results   Component Value Date/Time    SODIUM 139 03/18/2017 09:01 AM    SODIUM 137 04/16/2011 08:22 AM    POTASSIUM 4.2 03/18/2017 09:01 AM    POTASSIUM 4.1 04/16/2011 08:22 AM    CHLORIDE 93 (L) 03/18/2017 09:01 AM    CHLORIDE 95 (L) 04/16/2011 08:22 AM    CO2 32 (H) 03/18/2017 09:01 AM    CO2 32 04/16/2011 08:22 AM    ANIONGAP 14 03/18/2017 09:01 AM    ANIONGAP 10 04/16/2011 08:22 AM    BUN 27 (H) 03/18/2017 09:01 AM    BUN 13 04/16/2011 08:22 AM    CREATININE  1.20 03/18/2017 09:01 AM    CREATININE 1.08 04/16/2011 08:22 AM    GLUCOSE 149 (H) 03/31/2011 05:00 PM    GLUCOSE NEGATIVE 03/24/2011 06:05 PM    Lab Results   Component Value Date/Time    CALCIUM 11.3 (H) 03/18/2017 09:01 AM    CALCIUM 8.7 04/16/2011 08:22 AM    ALBUMIN 3.9 10/23/2016 12:16 PM    TOTALPROTEIN 6.8 10/23/2016 12:16 PM    ALKPHOS 109 10/23/2016 12:16 PM    AST 22 10/23/2016 12:16 PM    ALT 29 10/23/2016 12:16 PM    TOTBILIRUBIN 0.4 10/23/2016 12:16 PM          Visit Diagnosis    Encounter Diagnoses   Name Primary?   . Obesity Yes   . Type 2 diabetes mellitus with complication, unspecified long term insulin use status ([place])    . Morbid obesity ([place])      Orders Placed This Encounter   . exenatide microspheres ER (BYDUREON ER) 2 mg       Morbid obesity due to excess calories (278.01  E66.01)  Impression: Chronic  She continues to loose weight.   Status post procedure with Dr. Bridgett Larsson  She will follow through on his recommendations.  Keep follow-up appointment.  Follow-up Labs will be ordered by the surgeon.  Call should any post-op complications developed including fever, chills, shortness of breath, and dysuria.      DM:   Pt has been told to use Metformin on a sliding scale.  We review her Diary and few  BG > 200 is noted. (worsening control)  With each use of metformin she wants to vomit.   I ask her to stop this altogether.   Last A1C 9.2   Will add short term use of  [place].   She could tolerate this as it's injection.         Return in about 3 months (around 08/19/2017), or if symptoms worsen or fail to improve, for DM. Marland Kitchen      Cherly Beach, MD  05/19/2017, 08:51    This note was partially generated using M[employer] Fluency Direct system, and there  may be some incorrect words, spellings, and punctuation that were not noted in checking the note before saving.

## 2017-05-19 NOTE — Patient Instructions (Signed)
Diabetes: Activity Tips    Being more active can help you manage your diabetes. The tips on this sheet can help you get the most from your exercise. They can also help you stay safe.  Staying Active  It's important for adults to spend less time sitting and being inactive. This is especially true if you have type 2 diabetes. When you are sitting for long periods of time, get up for short sessions of light activity every 30 minutes.  You should aim for at least 150 minutes a week of exercise or physical activity. Don't let more than 2 days go by without being active.  Benefit from briskness  Brisk activity gets your heart beating faster. This can help you increase your fitness, lose extra weight, and manage your blood sugar level. Try brisk walking. Or, if you have foot or leg problems, you can try swimming or bike riding. You can break up your exercise into chunks throughout the day. Work up to at least30 minutes of steady, brisk exercise on most days.  Warm up and cool down  Warming up and cooling down reduce your risk of injury. They also help limit muscle soreness. Do a mild version of your activity for 5minutes before and after your routine. You can also learn stretches that will help keep your muscles loose. Your healthcare provider may show you good ways to warm up and stretch.  Do the talk-sing test  The talk-sing test is a simple way to tell how hard you're exercising. If you can talk while exercising, you're in a safe range. If you're out of breath, slow down. If you can carry a tune, it's time to pick up the pace. Walk up a hill. Increase the resistance on your stationary bike. Or swim faster.  What about eating?  You may be told to plan your exercise for 1 to 2 hours after a meal. In most cases, you don't need to eat while being active. If you take insulin or medicine that can cause low blood sugar, test your blood sugar before exercising. And carry a fast-acting sugar that will raise your blood sugar  level quickly. This includes glucose tablets or hard candy. Use it if you feel low blood sugar symptoms.  Safety tips  These tips can help you stay safe as you become fit:   Exercise with a friend or carry a cell phone if you have one.   Carry or wear identification, such as a necklace or bracelet, that says you have diabetes.   Use the proper footwear and safety equipment for your activity.   Drink water before, during, and after exercise.   Dress properly for the weather.   Don't exercise in very hot or very cold weather.   Don't exercise if you are sick.   If you are instructed to do so, test your blood sugar before and after you exercise. Have a small carbohydrate snack if your blood sugar is low before you start exercising.  When to stop exercising and call your healthcare provider  Stop exercising and call your healthcare provider right away if you notice any of the following:   Pain, pressure, tightness, or heaviness in the chest   Pain or heaviness in the neck, shoulders, back, arms, legs, or feet   Unusual shortness of breath   Dizziness or lightheadedness   Unusually rapid or slow pulse   Increased joint or muscle pain   Nausea or vomiting  Date Last Reviewed: 04/08/2015     2000-2017 [employer]. [address], [place] [address]. All rights reserved. This information is not intended as a substitute for professional medical care. Always follow your healthcare professional's instructions.        Weight Management: Healthy Eating  Food is your body's fuel. You can't live without it. The key is to give your body enough nutrients and energy without eating too much. Reading food labels can help you make healthy choices. Also, learn new eating habits to manage your weight.     All the values on the label are based on one serving. The serving size is the average portion. Remember to multiply the values on the label by the number of servings you eat.   Eat less fat  A gram of  fat has almost2.5 timesthe calories of a gram of protein or carbohydrates.Try to balance your food choices so that only 20% to35% of your calories comes from totalfat. This means an average of 2to 3grams of fat for each 100 calories you eat.  Eat more fiber  High-fiber foods are digested more slowly than low-fiber foods, so you feel full longer.Try to get at least 25 grams of fiber each day for a 2000 calorie diet. Foods high in fiber include:   Vegetables and fruits   Whole-grain or bran breads, pastas, and cereals   Legumes (beans) and peas  As you begin to eat more fiber, be sure to drink plenty of water to keep your digestive system working smoothly.  Tips  Do's and don'ts include:   Don't skip meals. This often leads to overeating later on. It's best to spread your eating throughout the day.   Eat a variety of foods, not just a few favorites.   If you find yourself eating when you're not hungry, ask yourself why. Many of [place] eat when we're bored, stressed, or just to be polite. Listen to your body. If you're not hungry, get busy doing something else instead of eating.   Eat slower, shooting for 20 to 30 minutes for each meal.It takes 20 minutes for your stomach to tell your brain that it's full. Slow eaters tend to eat less and are still satisfied, while fast eaters may tend to be overeaters.   Pay attention to what you eat. Don't read or watch TV during your meal.  Date Last Reviewed: 01/07/2015   2000-2017 [employer]. [address] [place] [address]. All rights reserved. This information is not intended as a substitute for professional medical care. Always follow your healthcare professional's instructions.

## 2017-05-19 NOTE — Nursing Note (Signed)
05/19/17 0800   Depression Screen   Little interest or pleasure in doing things. 0   Feeling down, depressed, or hopeless 0   PHQ 2 Total 0

## 2017-05-25 ENCOUNTER — Other Ambulatory Visit (HOSPITAL_BASED_OUTPATIENT_CLINIC_OR_DEPARTMENT_OTHER): Payer: Self-pay | Admitting: Family Medicine

## 2017-05-25 DIAGNOSIS — R110 Nausea: Secondary | ICD-10-CM

## 2017-05-25 MED ORDER — ONDANSETRON 8 MG DISINTEGRATING TABLET
8.0000 mg | ORAL_TABLET | Freq: Three times a day (TID) | ORAL | 1 refills | Status: DC | PRN
Start: 2017-05-25 — End: 2017-11-17

## 2017-05-25 NOTE — Telephone Encounter (Signed)
Regarding: Dr. Russ Halo  ----- Message from Loma Newton sent at 05/25/2017 11:56 AM EDT -----  Current Outpatient Prescriptions:  ondansetron (ZOFRAN ODT) 8 mg Oral Tablet, Rapid Dissolve, take 1 tablet every 8 hours if needed

## 2017-06-16 ENCOUNTER — Ambulatory Visit (HOSPITAL_BASED_OUTPATIENT_CLINIC_OR_DEPARTMENT_OTHER): Payer: Self-pay | Admitting: Family Medicine

## 2017-06-16 ENCOUNTER — Other Ambulatory Visit (HOSPITAL_BASED_OUTPATIENT_CLINIC_OR_DEPARTMENT_OTHER): Payer: Self-pay

## 2017-06-16 NOTE — Telephone Encounter (Signed)
Regarding: Dr. Russ Halo  ----- Message from Myna Hidalgo sent at 06/16/2017 12:06 PM EDT -----  Patient is requesting a refill for Voltaren Gel

## 2017-06-19 ENCOUNTER — Telehealth (HOSPITAL_BASED_OUTPATIENT_CLINIC_OR_DEPARTMENT_OTHER): Payer: Self-pay | Admitting: Family Medicine

## 2017-06-19 DIAGNOSIS — R52 Pain, unspecified: Secondary | ICD-10-CM

## 2017-06-19 MED ORDER — DICLOFENAC 1 % TOPICAL GEL
2.00 g | Freq: Four times a day (QID) | CUTANEOUS | 3 refills | Status: DC
Start: 2017-06-19 — End: 2017-09-15

## 2017-07-15 ENCOUNTER — Ambulatory Visit (INDEPENDENT_AMBULATORY_CARE_PROVIDER_SITE_OTHER): Payer: Commercial Managed Care - PPO

## 2017-07-15 VITALS — Wt 222.0 lb

## 2017-07-15 DIAGNOSIS — E119 Type 2 diabetes mellitus without complications: Secondary | ICD-10-CM

## 2017-07-15 LAB — POCT HGB A1C: POCT HGB A1C: 8.9 % — AB (ref 4–6)

## 2017-07-15 NOTE — Nursing Note (Signed)
07/15/17 1400   A1C   A1C 8.9

## 2017-07-16 ENCOUNTER — Telehealth (HOSPITAL_BASED_OUTPATIENT_CLINIC_OR_DEPARTMENT_OTHER): Payer: Self-pay | Admitting: Family Medicine

## 2017-07-16 NOTE — Telephone Encounter (Signed)
-----   Message from Cherly Beach, MD sent at 07/15/2017  4:54 PM EDT -----  Please inform the patient that her A1c is now 8.9 this is down from 9.4.  This is trending in the right direction.  She needs to continue med compliance and compliance with a diabetic diet.  Exercising 20 min a day 5 days a week while.  Will review this together next encounter.  TY TH

## 2017-07-16 NOTE — Telephone Encounter (Signed)
patient was notified of results of her A1c.

## 2017-08-18 ENCOUNTER — Ambulatory Visit (INDEPENDENT_AMBULATORY_CARE_PROVIDER_SITE_OTHER): Payer: Commercial Managed Care - PPO | Admitting: Family Medicine

## 2017-08-18 VITALS — BP 142/80 | HR 80 | Ht 66.0 in | Wt 224.0 lb

## 2017-08-18 DIAGNOSIS — Z6836 Body mass index (BMI) 36.0-36.9, adult: Secondary | ICD-10-CM

## 2017-08-18 DIAGNOSIS — E119 Type 2 diabetes mellitus without complications: Secondary | ICD-10-CM

## 2017-08-18 DIAGNOSIS — R600 Localized edema: Secondary | ICD-10-CM

## 2017-08-18 LAB — POCT HGB A1C: POCT HGB A1C: 8.3 % — AB (ref 4–6)

## 2017-08-18 NOTE — Patient Instructions (Signed)
A1C  Does this test have other names?  Hemoglobin A1c; HbA1c; glycosylated hemoglobin; glycohemoglobin; Glycated hemoglobin  What is this test?  A1C is a blood test that shows average blood sugar (glucose) levels over the last 3 months. The test is done to find out if a person has diabetes or prediabetes. It's also used to see how well a person with diabetes controls their blood sugar. The test can help guide diabetes treatment over time.  Why do I need this test?  You may need this test to check for prediabetes or diabetes.  If you have diabetes or prediabetes, you may need this test to see how well you control your blood sugar. People with diabetes need to track their blood sugar (glucose) levels every day to make sure they aren't too high or too low. The A1C test gives results for a longer period of time. It shows if your blood sugar has been too high on average over the last 3 months.  Glucose sticks to hemoglobin in the blood. Hemoglobin is a protein in red blood cells that carries oxygen. When blood sugar is high, more glucose builds up and sticks to the hemoglobin. The A1C test measures how much of the hemoglobin is coated with sugar.  You may have the test when a healthcare provider first works with you to treat your diabetes. You may then need to have the A1C test 2 or more times a year. This depends on the type of diabetes and how well it's controlled. The American Diabetes Association (ADA)advises an A1C test at least 2 times a year if you are meeting your blood sugar goals. If you aren't meeting your goals or your medicine has changed, you should have the A1C test more often.  What other tests might I have along with this test?  If your healthcare provider tests you for diabetes, you may also have any of these tests:   Fasting plasma glucose blood test (FPG)   Oral glucose tolerance test (OGTT)   Urine test to check for sugar, ketones, or protein  What do my test results mean?  Test results may  vary depending on your age, gender, health history, the method used for the test, and other things. Your test results may not mean you have a problem. Ask your healthcare provider what your test results mean for you.  A1C results are reported as a percentage. Here are what the results mean:   A1C below 5.7%. This is normal.   A1C from 5.7% to 6.4%. You may have prediabetes. This means you have a higher risk for diabetes in the future.   A1C of 6.5% or above on 2 separate tests. You may have diabetes.  The ADA says that people with diabetes should keep an A1C below 7%. The American Association of Clinical Endocrinologists advises an A1C of 6.5% or less. Your healthcare provider may give you other advice. This is based on your age, health conditions, and other things.  How is this test done?  The test is done with a blood sample. A needle is used to draw blood from a vein in your arm or hand.  Does this test pose any risks?  Having a blood test with a needle carries some risks. These include bleeding, infection, bruising, and feeling lightheaded. When the needle pricks your arm or hand, you may feel a slight sting or pain. Afterward, the site may be sore.  What might affect my test results?  Your blood sugar   levels change throughout the day. This won't affect the A1C test result.  If you have sickle cell anemia or other blood disorders, an A1C test may be less useful for diagnosing or watching diabetes. Your healthcare provider may tell you to use a different test that will work better for you.  The test results may be less accurate if you have any of the below:   Anemia   Heavy bleeding   Iron deficiency   Kidney failure   Liver disease  How do I get ready for this test?  You don't need to get ready for the test.   2000-2018 [employer] [address], [place] [address]. All rights reserved. This information is not intended as a substitute for professional medical care. Always  follow your healthcare professional's instructions.

## 2017-08-18 NOTE — Nursing Note (Signed)
08/18/17 0900   A1C   A1C 8.3

## 2017-08-18 NOTE — Progress Notes (Signed)
Patient Name: Monique [name]  Date: 08/18/2017  [facility]  [address]  [place] [address]  [phone]  MRN: [id number]  DOB: 1966-07-18    Chief complaint: Diabetes follow-up    HPI: The patient is a 51 y.o. old female who came in today for       Diabetes:  Symptoms do not include polydipsia, polyuria, polyphagia, weight loss, fatigue, blurred vision, irritability, somnolence, dizziness or sweating. Symptom onset was gradual years ago. The symptoms occur constantly. The episodes occur daily. This is described as moderate in severity and unchanged. Symptoms are relieved by regular exercise, stress management and following diet. Last A1c 9.2, stopped metformin at last OV. Added Byetta.  Has not had recent eye exam.      Medications:  Current Outpatient Prescriptions   Medication Sig   . ACCU-CHEK AVIVA PLUS TEST STRP Strip 1 Strip by Subcutaneous route Three times a day as needed   . clopidogrel (PLAVIX) 75 mg Oral Tablet Take 1 Tab (75 mg total) by mouth Once a day   . diclofenac sodium (VOLTAREN) 1 % Gel 2 g by Apply Topically route Four times a day - before meals and bedtime   . furosemide (LASIX) 40 mg Oral Tablet Take 1 Tab (40 mg total) by mouth Once a day   . HYDROcodone-acetaminophen (NORCO) 10-325 mg Oral Tablet Take 1 Tab by mouth Every 4 hours as needed   . lisinopril (PRINIVIL) 2.5 mg Oral Tablet take 1 tablet by mouth once daily   . metoprolol (LOPRESSOR) 25 mg Oral Tablet take 1 tablet by mouth once daily   . niacin (NIASPAN) 500 mg Oral Tablet Sustained Release Take 1 Tab (500 mg total) by mouth Once a day   . omeprazole (PRILOSEC) 40 mg Oral Capsule, Delayed Release(E.C.) take 1 capsule by mouth once daily   . ondansetron (ZOFRAN ODT) 8 mg Oral Tablet, Rapid Dissolve Take 1 Tab (8 mg total) by mouth Every 8 hours as needed for nausea/vomiting   . PRENATAL PLUS, CALCIUM CARB, 27 mg iron- 1 mg Oral Tablet Take 1 Tab by mouth Once a day   .  rosuvastatin (CRESTOR) 20 mg Oral Tablet Take 1 Tab (20 mg total) by mouth Once a day     Allergies:  Allergies   Allergen Reactions   . No Known Drug Allergies      Past medical history:  Past Medical History:   Diagnosis Date   . Coronary artery disease    . Diabetes mellitus type II    . Ear piercing    . GERD (gastroesophageal reflux disease)    . HTN    . Hyperlipidemia    . Hypertension    . Neck problem     herniated cervical disc   . Obesity    . Stroke (CMS [facility] [place])     2002   . Tattoo     Left breast, Right shoulder   . Type 2 diabetes mellitus     Dx 1999 average fasting 70-89     Surgical history:  Past Surgical History:   Procedure Laterality Date   . HX ANKLE FRACTURE TX      Right   . HX CATARACT REMOVAL      r and l eyes with implants   . HX CESAREAN SECTION      x 3   . HX CHOLECYSTECTOMY     . HX TONSILLECTOMY  Family history:  Family Medical History     Problem Relation (Age of Onset)    Diabetes Mother    Heart Attack Mother    Hypertension Mother        Social history:  Social History     Social History   . Marital status: Married     Spouse name: N/A   . Number of children: N/A   . Years of education: N/A     Social History Main Topics   . Smoking status: Current Every Day Smoker     Packs/day: 0.25     Years: 20.00     Types: Cigarettes     Last attempt to quit: 03/30/2011   . Smokeless tobacco: Never Used   . Alcohol use No   . Drug use: No   . Sexual activity: Not on file     Other Topics Concern   . Routine Exercise No   . Ability To Walk 2 Flight Of Steps Without Sob/Cp Yes     Social History Narrative     Vitals:  Vitals:    08/18/17 0911   BP: (!) 142/80   Pulse: 80   SpO2: 95%   Weight: 101.6 kg (224 lb)   Height: 1.676 m (5\' 6" )   BMI: 36.23     Body mass index is 36.15 kg/(m^2).     Physical Examination:      GENERAL:   Pt is a pleasant, well-nourished, well-developed 51 y.o. female who is in NAD. Appears stated age  9:  head normocephalic, symmetrical facies. EOM intact b/l.  PERRLA. Sclera non-icteric, non-injected. No rhinorrhea. Oropharyngeal mucous membranes are moist  w/o erythema/exudates, upper eyelid w/Xythoma-lipid plaques. Stable.   NECK: supple. No masses, lymphadenopathy, JVD, or carotid bruits on exam. Trachea midline, no thyromegaly   CV:  S1-S2 No murmurs, rubs, gallops. -  LUNGS: CTAB, No rhonchi, rales, wheezes.   GI: (+) BS in all 4 quadrants. soft, NT/ND. No rigidity/ guarding/ rebound. No organomegaly/masses, or abdominal bruits  MSK: Spontaneous normal AROM of major joints as observed during exam. No joint effusions/ swelling/ deformities.   Trace to +1 BLE edema, clubbing, or cyanosis. 2+ radial pulse present b/l. + 2 reflexes Brachioradialis, patellar bilaterally. Normal gait  SKIN: No significant lesions, rashes, ecchymoses noted. Note: midline chest scar consistent w/ CABG.  NEURO: AAOx4, CN grossly intact. Sensation intact b/l. No focal deficits.  PSYCH: Mood, behavior and affect normal w/ Intact judgement & insight       LABS:     CBC  Diff   Lab Results   Component Value Date/Time    WBC 11.1 (H) 10/23/2016 12:16 PM    WBC 14.0 (H) 04/16/2011 08:22 AM    HGB 14.8 10/23/2016 12:16 PM    HGB 10.3 (L) 04/16/2011 08:22 AM    HCT 45.0 10/23/2016 12:16 PM    HCT 32.3 (L) 04/16/2011 08:22 AM    PLTCNT 382 10/23/2016 12:16 PM    PLTCNT 1197 (H) 04/16/2011 08:22 AM    RBC 5.04 10/23/2016 12:16 PM    RBC 3.55 (L) 04/16/2011 08:22 AM    MCV 89.3 10/23/2016 12:16 PM    MCV 90.9 04/16/2011 08:22 AM    MCHC 32.9 (L) 10/23/2016 12:16 PM    MCHC 31.9 04/16/2011 08:22 AM    MCH 29.4 10/23/2016 12:16 PM    MCH 29.0 04/16/2011 08:22 AM    RDW 14.4 10/23/2016 12:16 PM    RDW 14.5 (H) 04/16/2011 08:22  AM    MPV 5.6 (L) 04/16/2011 08:22 AM    Lab Results   Component Value Date/Time    PMNS 84 (H) 04/01/2011 05:08 AM    LYMPHOCYTES 8 (L) 04/01/2011 05:08 AM    EOSINOPHIL 0 (L) 04/01/2011 05:08 AM    MONOCYTES 8 04/01/2011 05:08 AM    BASOPHILS 0 04/01/2011 05:08 AM    PMNABS 13.200  (H) 04/01/2011 05:08 AM    LYMPHSABS 1.210 04/01/2011 05:08 AM    EOSABS 0.018 (L) 04/01/2011 05:08 AM    MONOSABS 1.210 (H) 04/01/2011 05:08 AM    BASOSABS 0.034 04/01/2011 05:08 AM          CBC  Diff   Lab Results   Component Value Date/Time    WBC 11.1 (H) 10/23/2016 12:16 PM    WBC 14.0 (H) 04/16/2011 08:22 AM    HGB 14.8 10/23/2016 12:16 PM    HGB 10.3 (L) 04/16/2011 08:22 AM    HCT 45.0 10/23/2016 12:16 PM    HCT 32.3 (L) 04/16/2011 08:22 AM    PLTCNT 382 10/23/2016 12:16 PM    PLTCNT 1197 (H) 04/16/2011 08:22 AM    RBC 5.04 10/23/2016 12:16 PM    RBC 3.55 (L) 04/16/2011 08:22 AM    MCV 89.3 10/23/2016 12:16 PM    MCV 90.9 04/16/2011 08:22 AM    MCHC 32.9 (L) 10/23/2016 12:16 PM    MCHC 31.9 04/16/2011 08:22 AM    MCH 29.4 10/23/2016 12:16 PM    MCH 29.0 04/16/2011 08:22 AM    RDW 14.4 10/23/2016 12:16 PM    RDW 14.5 (H) 04/16/2011 08:22 AM    MPV 5.6 (L) 04/16/2011 08:22 AM    Lab Results   Component Value Date/Time    PMNS 84 (H) 04/01/2011 05:08 AM    LYMPHOCYTES 8 (L) 04/01/2011 05:08 AM    EOSINOPHIL 0 (L) 04/01/2011 05:08 AM    MONOCYTES 8 04/01/2011 05:08 AM    BASOPHILS 0 04/01/2011 05:08 AM    PMNABS 13.200 (H) 04/01/2011 05:08 AM    LYMPHSABS 1.210 04/01/2011 05:08 AM    EOSABS 0.018 (L) 04/01/2011 05:08 AM    MONOSABS 1.210 (H) 04/01/2011 05:08 AM    BASOSABS 0.034 04/01/2011 05:08 AM        Comprehensive Metabolic Profile    Lab Results   Component Value Date/Time    SODIUM 139 03/18/2017 09:01 AM    SODIUM 137 04/16/2011 08:22 AM    POTASSIUM 4.2 03/18/2017 09:01 AM    POTASSIUM 4.1 04/16/2011 08:22 AM    CHLORIDE 93 (L) 03/18/2017 09:01 AM    CHLORIDE 95 (L) 04/16/2011 08:22 AM    CO2 32 (H) 03/18/2017 09:01 AM    CO2 32 04/16/2011 08:22 AM    ANIONGAP 14 03/18/2017 09:01 AM    ANIONGAP 10 04/16/2011 08:22 AM    BUN 27 (H) 03/18/2017 09:01 AM    BUN 13 04/16/2011 08:22 AM    CREATININE 1.20 03/18/2017 09:01 AM    CREATININE 1.08 04/16/2011 08:22 AM    GLUCOSE 149 (H) 03/31/2011 05:00 PM     GLUCOSE NEGATIVE 03/24/2011 06:05 PM    Lab Results   Component Value Date/Time    CALCIUM 11.3 (H) 03/18/2017 09:01 AM    CALCIUM 8.7 04/16/2011 08:22 AM    ALBUMIN 3.9 10/23/2016 12:16 PM    TOTALPROTEIN 6.8 10/23/2016 12:16 PM    ALKPHOS 109 10/23/2016 12:16 PM    AST 22 10/23/2016 12:16 PM  ALT 29 10/23/2016 12:16 PM    TOTBILIRUBIN 0.4 10/23/2016 12:16 PM           08/18/17 0900   A1C   A1C 8.3         Visit Diagnosis    Encounter Diagnoses   Name Primary?   . Diabetes Mellitus, Type II Yes     Orders Placed This Encounter   . POCT HGB A1C       Morbid obesity due to excess calories (278.01  E66.01)  Impression: Chronic  She continues to loose weight-additional 20 lb since last encounter.  Status post procedure with Dr. Bridgett Larsson  She will follow through on his recommendations.  Keep follow-up appointment.  Follow-up Labs will be ordered by the surgeon.  She has had some recent dumping.  We discussed this she will alert [place] if it becomes more frequent..      DM:   Chronic, improving.  Continue behavioral efforts including a diabetic friendly diet.  Her recent surgery is been extremely helpful.   Last A1C 9.2, now down 1 point see above.  Continue use of   Byetta.   She could tolerate this as it's injection.     Edema  Bilateral lower extremity +1.  She has follow-up with Dr. Higinio Roger, next week  On defer the decision to order an echocardiogram to him.  She continues to use Lasix on an intermittent basis.      Return in about 3 months (around 11/17/2017), or if symptoms worsen or fail to improve, for Diabetes mellitus, edema, weight loss.        Cherly Beach, MD  08/18/2017, 09:35    This note was partially generated using [employer] Flu[employer] Direct system, and there may be some incorrect words, spellings, and punctuation that were not noted in checking the note before saving.

## 2017-08-20 ENCOUNTER — Ambulatory Visit (INDEPENDENT_AMBULATORY_CARE_PROVIDER_SITE_OTHER): Payer: Self-pay | Admitting: Cardiovascular Disease

## 2017-08-25 ENCOUNTER — Ambulatory Visit (INDEPENDENT_AMBULATORY_CARE_PROVIDER_SITE_OTHER): Payer: Commercial Managed Care - PPO | Admitting: PHYSICIAN ASSISTANT

## 2017-08-25 ENCOUNTER — Ambulatory Visit
Admission: RE | Admit: 2017-08-25 | Discharge: 2017-08-25 | Disposition: A | Discharge status: Home / Self Care | Payer: Commercial Managed Care - PPO | Source: Ambulatory Visit | Attending: PHYSICIAN ASSISTANT | Admitting: PHYSICIAN ASSISTANT

## 2017-08-25 ENCOUNTER — Encounter (HOSPITAL_BASED_OUTPATIENT_CLINIC_OR_DEPARTMENT_OTHER): Payer: Self-pay | Admitting: PHYSICIAN ASSISTANT

## 2017-08-25 VITALS — BP 132/80 | HR 73 | Resp 14 | Ht 69.0 in | Wt 214.5 lb

## 2017-08-25 DIAGNOSIS — Z6831 Body mass index (BMI) 31.0-31.9, adult: Secondary | ICD-10-CM

## 2017-08-25 DIAGNOSIS — Z87891 Personal history of nicotine dependence: Secondary | ICD-10-CM | POA: Insufficient documentation

## 2017-08-25 DIAGNOSIS — F17210 Nicotine dependence, cigarettes, uncomplicated: Secondary | ICD-10-CM

## 2017-08-25 DIAGNOSIS — J209 Acute bronchitis, unspecified: Secondary | ICD-10-CM

## 2017-08-25 MED ORDER — DOXYCYCLINE HYCLATE 100 MG CAPSULE
100.00 mg | ORAL_CAPSULE | Freq: Two times a day (BID) | ORAL | 0 refills | Status: AC
Start: 2017-08-25 — End: 2017-09-01

## 2017-08-25 MED ORDER — VENTOLIN HFA 90 MCG/ACTUATION AEROSOL INHALER
1.00 | INHALATION_SPRAY | Freq: Four times a day (QID) | RESPIRATORY_TRACT | 1 refills | Status: DC | PRN
Start: 2017-08-25 — End: 2017-11-24

## 2017-08-25 MED ORDER — CHLORPHENIRAMINE-DEXTROMETHORPHAN 2 MG-15 MG/5 ML ORAL LIQUID
5.00 mL | Freq: Four times a day (QID) | ORAL | 0 refills | Status: AC | PRN
Start: 2017-08-25 — End: 2017-09-01

## 2017-08-25 NOTE — Progress Notes (Signed)
Patient Name: Monique [name]  Date: 08/25/2017  [place], [place]  [address]  [place] [address]  [phone]  MRN: [id number]  DOB: 11/15/1966    Chief complaint: Cold Symptoms    HPI:  The patient is a 51 y.o. old female who came in today for an acute visit complaining of nasal congestion, productive cough, wheezing, and headache that have been present and worsening for the past 3 days. She does not have known underlying lung disease, but is a current smoker. She denies known fevers/chills, but states that she has had episodes of hot and cold. The cough is worse with lying down. She has tried taking Tessalon perles, but has not had any relief with this.     ROS:  Pertinent positives noted in red. Otherwise, negative.     Constitutional: unintentional weight gain/loss, fever, chills, fatigue, tiredness  Skin: skin problems or rashes, itching  Musculoskeletal: unilateral muscle weakness, atrophy, joint pain/swelling  HEENT: blurred vision, vision loss, nasal congestion, otalgia, hearing loss, epistaxis, sore throat, difficulty swallowing  Cardiovascular:  heart valve problems/murmurs, irregular heartbeat, angina/chest pain, swelling in feet, swelling in hands, shortness of breath lying flat, short of breath climbing stairs, palpitations  Respiratory: shortness of breath, productive cough, hemoptysis, wheezing  Gastrointestinal: abdominal pain, diarrhea, constipation, vomiting blood, black or tarry stools, nausea, GERD  Genitourinary: hematuria, change in urinary stream, urinary frequency, incontinence, dysuria, nocturia, sexual dysfunction  Neurologic: headache, seizures, stroke, dizziness, loss of consciousness, paresthesias, numbness  Psychiatric: anxiety, depression, memory loss, suicidal/homicidal thoughts  Endocrine: diabetes, polydipsia, polyuria, thyroid problems    Past medical history:  Past Medical History:   Diagnosis Date   . Coronary artery disease     . Diabetes mellitus type II    . Ear piercing    . GERD (gastroesophageal reflux disease)    . HTN    . Hyperlipidemia    . Hypertension    . Neck problem     herniated cervical disc   . Obesity    . Stroke (CMS [facility])     2002   . Tattoo     Left breast, Right shoulder   . Type 2 diabetes mellitus     Dx 1999 average fasting 70-89     Past surgical history:  Past Surgical History:   Procedure Laterality Date   . HX ANKLE FRACTURE TX      Right   . HX CATARACT REMOVAL      r and l eyes with implants   . HX CESAREAN SECTION      x 3   . HX CHOLECYSTECTOMY     . HX TONSILLECTOMY       Medications:  Current Outpatient Prescriptions   Medication Sig   . ACCU-CHEK AVIVA PLUS TEST STRP Strip 1 Strip by Subcutaneous route Three times a day as needed   . Chlorpheniramine-DM 2-15 mg/5 mL Oral Liquid Take 5 mL by mouth Every 6 hours as needed for up to 7 days   . clopidogrel (PLAVIX) 75 mg Oral Tablet Take 1 Tab (75 mg total) by mouth Once a day   . diclofenac sodium (VOLTAREN) 1 % Gel 2 g by Apply Topically route Four times a day - before meals and bedtime   . doxycycline hyclate (VIBRAMYCIN) 100 mg Oral Capsule Take 1 Cap (100 mg total) by mouth Twice daily for 7 days   . furosemide (LASIX) 40 mg Oral Tablet Take 1  Tab (40 mg total) by mouth Once a day   . HYDROcodone-acetaminophen (NORCO) 10-325 mg Oral Tablet Take 1 Tab by mouth Every 4 hours as needed   . lisinopril (PRINIVIL) 2.5 mg Oral Tablet take 1 tablet by mouth once daily   . metoprolol (LOPRESSOR) 25 mg Oral Tablet take 1 tablet by mouth once daily   . niacin (NIASPAN) 500 mg Oral Tablet Sustained Release Take 1 Tab (500 mg total) by mouth Once a day   . omeprazole (PRILOSEC) 40 mg Oral Capsule, Delayed Release(E.C.) take 1 capsule by mouth once daily   . ondansetron (ZOFRAN ODT) 8 mg Oral Tablet, Rapid Dissolve Take 1 Tab (8 mg total) by mouth Every 8 hours as needed for nausea/vomiting   . PRENATAL PLUS, CALCIUM CARB, 27 mg iron- 1 mg Oral Tablet Take 1 Tab by  mouth Once a day   . rosuvastatin (CRESTOR) 20 mg Oral Tablet Take 1 Tab (20 mg total) by mouth Once a day   . VENTOLIN HFA 90 mcg/actuation Inhalation HFA Aerosol Inhaler Take 1-2 Puffs by inhalation Every 6 hours as needed (wheezing, cough)     Allergies:  Allergies   Allergen Reactions   . No Known Drug Allergies      Family history:  Family Medical History     Problem Relation (Age of Onset)    Diabetes Mother    Heart Attack Mother    Hypertension Mother        Social history:  Social History     Social History   . Marital status: Married     Spouse name: N/A   . Number of children: N/A   . Years of education: N/A     Social History Main Topics   . Smoking status: Current Every Day Smoker     Packs/day: 0.25     Years: 20.00     Types: Cigarettes     Last attempt to quit: 03/30/2011   . Smokeless tobacco: Never Used   . Alcohol use No   . Drug use: No   . Sexual activity: Not on file     Other Topics Concern   . Routine Exercise No   . Ability To Walk 2 Flight Of Steps Without Sob/Cp Yes     Social History Narrative     Vitals:    08/25/17 1333 08/25/17 1433   BP: 98/78 132/80   Pulse: 73    Resp: 14    SpO2: 95%    Weight: 97.3 kg (214 lb 8 oz)    Height: 1.753 m (5\' 9" )    BMI: 31.74      Body mass index is 31.68 kg/(m^2).    Physical Examination:  General: acutely ill, mildly obese, appears stated age, no distress and vital signs reviewed  Eyes: Conjunctiva clear., Pupils equal and round, reactive to light and accomodation. , Sclera non-icteric.   HENT:TM's Clear. , Ear canals normal., Nose:  generalized rhinitis and significant inferior turbinate hypertrophy, Mouth mucous membranes moist. , Pharynx injected.   Neck: no thyromegaly or lymphadenopathy and supple, symmetrical, trachea midline  Lungs: decreased breath sounds throughout all lung fields  Cardiovascular: regular rate and rhythm, S1, S2 normal, no murmur, click, rub or gallop  Extremities: No synovitis or joint effusions  Skin: Skin color, texture,  turgor normal. No rashes or lesions  Neurologic: Grossly normal, Gait is normal.  , CN II - XII grossly intact , Alert and oriented x3, No tremor  Lymphatics:  No lymphadenopathy  Psychiatric: Normal affect, behavior, memory, thought content, judgement, and speech.    Visit Diagnosis  Acute bronchitis  Patient reassurance that Acute Bronchitis is most commonly related to virally-mediated infections.   Symptomatic treatment as follows:  Increase PO fluids to ensure good hydration.  Start Antitussive: Chlorpheniramine DM  Start doxycycline  Start Bronchodilators: Albuterol inhaled  Start Antipyretics/Analgesics: NSAID, Acetaminophen   Further Evaluation: CXR  Consider PFT's in the future.    - XR CHEST PA AND LATERAL; Future  - VENTOLIN HFA 90 mcg/actuation Inhalation HFA Aerosol Inhaler; Take 1-2 Puffs by inhalation Every 6 hours as needed (wheezing, cough)  Dispense: 1 Inhaler; Refill: 1  - doxycycline hyclate (VIBRAMYCIN) 100 mg Oral Capsule; Take 1 Cap (100 mg total) by mouth Twice daily for 7 days  Dispense: 14 Cap; Refill: 0  - Chlorpheniramine-DM 2-15 mg/5 mL Oral Liquid; Take 5 mL by mouth Every 6 hours as needed for up to 7 days  Dispense: 118 mL; Refill: 0    Return if symptoms worsen or fail to improve.    Chryl Heck, PA-C  08/25/2017, 14:44  Electronically signed by Chryl Heck, PA-C

## 2017-08-26 ENCOUNTER — Encounter (INDEPENDENT_AMBULATORY_CARE_PROVIDER_SITE_OTHER): Payer: Self-pay | Admitting: Nurse Practitioner

## 2017-08-27 ENCOUNTER — Ambulatory Visit (INDEPENDENT_AMBULATORY_CARE_PROVIDER_SITE_OTHER): Payer: Commercial Managed Care - PPO | Admitting: Nurse Practitioner

## 2017-08-27 ENCOUNTER — Encounter (INDEPENDENT_AMBULATORY_CARE_PROVIDER_SITE_OTHER): Payer: Self-pay | Admitting: Nurse Practitioner

## 2017-08-27 VITALS — BP 90/58 | HR 60 | Resp 16 | Ht 65.0 in | Wt 215.8 lb

## 2017-08-27 DIAGNOSIS — E119 Type 2 diabetes mellitus without complications: Secondary | ICD-10-CM

## 2017-08-27 DIAGNOSIS — I6789 Other cerebrovascular disease: Secondary | ICD-10-CM

## 2017-08-27 DIAGNOSIS — Z951 Presence of aortocoronary bypass graft: Principal | ICD-10-CM

## 2017-08-27 DIAGNOSIS — E7800 Pure hypercholesterolemia, unspecified: Secondary | ICD-10-CM

## 2017-08-27 DIAGNOSIS — Z6835 Body mass index (BMI) 35.0-35.9, adult: Secondary | ICD-10-CM

## 2017-08-27 DIAGNOSIS — I10 Essential (primary) hypertension: Secondary | ICD-10-CM

## 2017-08-27 DIAGNOSIS — I219 Acute myocardial infarction, unspecified: Secondary | ICD-10-CM

## 2017-08-27 DIAGNOSIS — I2510 Atherosclerotic heart disease of native coronary artery without angina pectoris: Secondary | ICD-10-CM

## 2017-08-27 MED ORDER — NITROGLYCERIN 0.4 MG SUBLINGUAL TABLET
0.4000 mg | SUBLINGUAL_TABLET | SUBLINGUAL | 3 refills | Status: DC | PRN
Start: 2017-08-27 — End: 2019-10-26

## 2017-08-27 MED ORDER — METOPROLOL SUCCINATE ER 25 MG TABLET,EXTENDED RELEASE 24 HR
12.5000 mg | ORAL_TABLET | Freq: Every day | ORAL | 3 refills | Status: DC
Start: 2017-08-27 — End: 2018-08-27

## 2017-08-27 NOTE — Patient Instructions (Addendum)
Echocardiogram    Decrease lasix to 20mg  daily  (if increased swelling, weight gain or SOB) take 40mg  as needed    Discontinue metoprolol    Add Toprol 12.5mg  daily (this comes in 25mg  tablet so half the tablet once a day)    Monitor BP for goal < 130/80 or > 100 on top    Carotid ultrasound

## 2017-08-27 NOTE — Progress Notes (Signed)
[place]  [address] [place]  [place] [address]  [phone]    Date:   08/27/2017  Name: Monique [name]  Age: 51 y.o.    REASON FOR VISIT:   Other    HISTORY OF PRESENTING ILLNESS:    Monique [name] is a 51 y.o. female with history of coronary artery disease status post CABG in 2012 followed by MI which resulted in PCI to the LIMA to the LAD, left main and ostial circ.  Patient was last seen here in the office in September of 2017 prior to gastric sleeve surgery.  She has now lost over 100 lb.  At that time she was stable from a cardiovascular standpoint.  She also gives a history of having CVA and known totaled left internal carotid artery. She has not undergone a repeat carotid ultrasound recently.  She returns today for follow-up exam.  She denies shortness of breath, palpitations, syncope or near-syncope.  Her blood pressure is low on today's exam and she does have some minimal dizziness on occasion.  She did complain of 1 episode of chest pain after taking doxycycline for an acute bronchitis followed by vomiting and relief of her discomfort.  She has not had any recurrent chest pain.  She also admits to silent ischemia prior to her CABG and PCI.  Her main complaint prior to her PCI at the time of her MI was fatigue.  Patient does complain of ankle edema on occasion.      Current Outpatient Prescriptions   Medication Sig   . ACCU-CHEK AVIVA PLUS TEST STRP Strip 1 Strip by Subcutaneous route Three times a day as needed   . Chlorpheniramine-DM 2-15 mg/5 mL Oral Liquid Take 5 mL by mouth Every 6 hours as needed for up to 7 days   . clopidogrel (PLAVIX) 75 mg Oral Tablet Take 1 Tab (75 mg total) by mouth Once a day   . diclofenac sodium (VOLTAREN) 1 % Gel 2 g by Apply Topically route Four times a day - before meals and bedtime   . doxycycline hyclate (VIBRAMYCIN) 100 mg Oral Capsule Take 1 Cap (100 mg total) by mouth Twice daily for 7 days   . furosemide (LASIX) 40  mg Oral Tablet Take 1 Tab (40 mg total) by mouth Once a day   . HYDROcodone-acetaminophen (NORCO) 10-325 mg Oral Tablet Take 1 Tab by mouth Every 4 hours as needed   . Ibuprofen (MOTRIN) 800 mg Oral Tablet Take 800 mg by mouth Three times a day as needed for Pain   . lisinopril (PRINIVIL) 2.5 mg Oral Tablet take 1 tablet by mouth once daily   . metoprolol succinate (TOPROL-XL) 25 mg Oral Tablet Sustained Release 24 hr Take 0.5 Tabs (12.5 mg total) by mouth Once a day   . niacin (NIASPAN) 500 mg Oral Tablet Sustained Release Take 1 Tab (500 mg total) by mouth Once a day   . nitroGLYCERIN (NITROSTAT) 0.4 mg Sublingual Tablet, Sublingual 1 Tab (0.4 mg total) by Sublingual route Every 5 minutes as needed for Chest pain for 3 doses over 15 minutes   . omeprazole (PRILOSEC) 40 mg Oral Capsule, Delayed Release(E.C.) take 1 capsule by mouth once daily   . ondansetron (ZOFRAN ODT) 8 mg Oral Tablet, Rapid Dissolve Take 1 Tab (8 mg total) by mouth Every 8 hours as needed for nausea/vomiting   . PRENATAL PLUS, CALCIUM CARB, 27 mg iron- 1 mg Oral Tablet Take 1 Tab by  mouth Once a day   . rosuvastatin (CRESTOR) 20 mg Oral Tablet Take 1 Tab (20 mg total) by mouth Once a day   . VENTOLIN HFA 90 mcg/actuation Inhalation HFA Aerosol Inhaler Take 1-2 Puffs by inhalation Every 6 hours as needed (wheezing, cough)     Allergies   Allergen Reactions   . No Known Drug Allergies      Past Medical History:   Diagnosis Date   . Coronary artery disease    . Diabetes mellitus type II    . Ear piercing    . GERD (gastroesophageal reflux disease)    . HTN    . Hyperlipidemia    . Hypertension    . Neck problem     herniated cervical disc   . Obesity    . Stroke (CMS [facility])     2002   . Tattoo     Left breast, Right shoulder   . Type 2 diabetes mellitus     Dx 1999 average fasting 70-89         Past Surgical History:   Procedure Laterality Date   . HX ANKLE FRACTURE TX      Right   . HX CATARACT REMOVAL      r and l eyes with implants   . HX  CESAREAN SECTION      x 3   . HX CHOLECYSTECTOMY     . HX GASTRIC SLEEVE     . HX TONSILLECTOMY           Family Medical History     Problem Relation (Age of Onset)    Diabetes Mother    Heart Attack Mother    Hypertension Mother            Social History     Social History   . Marital status: Married     Spouse name: N/A   . Number of children: N/A   . Years of education: N/A     Social History Main Topics   . Smoking status: Current Every Day Smoker     Packs/day: 0.25     Years: 20.00     Types: Cigarettes     Last attempt to quit: 03/30/2011   . Smokeless tobacco: Never Used   . Alcohol use No   . Drug use: No   . Sexual activity: Not on file     Other Topics Concern   . Routine Exercise No   . Ability To Walk 2 Flight Of Steps Without Sob/Cp Yes     Social History Narrative   . No narrative on file       REVIEW OF SYSTEMS:  Constitutional: No fevers, chills, malaise or abnormal weight loss.    HENT: No tinnitus, vertigo, hearing loss, ear pain, sinus drainage, nasal congestion or throat pain.  Eyes: No vision changes, diplopia, drainage, pain  Respiratory: No cough, shortness of breath, wheezing  Cardiovascular: No chest pain, PND, orthopnea, palpitations, trace ankle edema.    Gastrointestinal: No abdominal pain, nausea, vomiting, diarrhea, constipation, melana or hematochezia   Endocrine: No polyuria, polydipsia, heat or cold intolerance.  Genitourinary: No dysuria, urgency, frequency, hematuria, nocturia.  Musculoskeletal: No myalgias, arthralgias.  Skin: No rashes, suspicious lesions.  Neurological: No headaches, weakness, paresthesias  Hematological:  No bleeding, spontaneous bruising.  Psychiatric/Behavioral: No confusion, agitation, hallucinations, paranoia, delusions.    All other systems reviewed and are negative except as noted in [place].    PHYSICAL EXAMINATION:  Vitals reviewed. BP (!) 90/58 Comment: RtA  Pulse 60  Resp 16  Ht 1.651 m (5\' 5" )  Wt 97.9 kg (215 lb 12.8 oz)  BMI 35.91 kg/m2   Vitals Filed       08/27/2017 1432 08/27/2017 1443          BP: (!)  90/50 (!)  90/58      Pulse: 60 -      Resp: 16 -            Constitutional: She is oriented to person, place, and time.  She appears well-developed and well-nourished.   HEENT:  Normocephalic, atraumatic.  TM's clear and intact bilaterally.  Canals clear.  Nose clear with drainage.  Normal appearing nasal mucosa.  Posterior pharynx clear without lesions or exudate. Mucous membranes moist without lesions.   Neck: Supple. Thyroid normal without enlargement or palpable nodules.  Trachea midline.  Cardiovascular: Normal rate and rhythm without murmurs, rubs or gallops.  No JVD.  No bruits.   Trace ankle edema is noted.  Pulmonary/Chest: Lungs clear without wheezes, rales or rhonchi.  No chest wall tenderness or deformity.   Abdominal: Soft. Nontender, nondistended with normal bowel sounds in all quadrants. No hepatosplenomegaly. No guarding, rebound , abnormal masses.  Rectal exam deferred.  Musculoskeletal: Full and painless ROM in all joint groups.  Neurological: Alert and oriented x 4.  Cranial nerves 2-12 intact without defecits.  BUE's/BLE's.  Strength and sensation intact in all extremeties.   Skin: Skin is warm and dry. No rash or suspicious skin lesions noted.  Psychiatric:  Normal mood and affect. Speech is normal and behavior is normal. Judgment and thought content normal. Cognition and memory are normal.  Vascular: Peripheral lower extremity pulses well felt.    ASSESSMENT:    ENCOUNTER DIAGNOSES     ICD-10-CM   1. S/P CABG x 2 Z95.1   2. Coronary artery disease, angina presence unspecified, unspecified vessel or lesion type, unspecified whether native or transplanted heart I25.10   3. Diabetes Mellitus, Type II E11.9   4. Hypercholesterolemia E78.00   5. Essential hypertension I10   6. Stroke I67.89   7. MI (myocardial infarction) I21.9         PLAN:  Ms Loyal is a 51 year old white female with history CAD status post prior CABG in 2012  followed by MI resulting in PCI to the LIMA to the LAD graft, left main and ostial circ.    CAD-no current anginal symptoms.  Patient advised to continue current medications.  Patient had silent ischemia prior to CABG and PCI.  Would recommend stress testing routinely every 2 year due to silent ischemia.    Hypertension-patient hypotensive with mild episodes of dizziness.  Will decrease Lasix to 20 mg daily as patient has lost approximately 100 lb with gastric sleeve surgery.  Additionally will discontinue metoprolol tartrate as she is only taking it once a day (incorrectly) and add Toprol 12.5 daily.  Patient advised to continue monitoring blood pressure for goal of less than 130/80 and greater than 176 systolic.    Hyperlipidemia-continue statin.  Lipid profile in November within normal range    Carotid stenosis-will repeat carotid ultrasound to ensure no further progression of right carotid disease.    History of MI-will obtain echocardiogram to evaluate patient's current LV function and rule out any structural abnormality due to complaints of occasional ankle edema    Orders Placed This Encounter   . TRANSTHORACIC ECHOCARDIOGRAM - ADULT   .  CAROTID ARTERY DUPLEX- PCA ONLY   . nitroGLYCERIN (NITROSTAT) 0.4 mg Sublingual Tablet, Sublingual   . metoprolol succinate (TOPROL-XL) 25 mg Oral Tablet Sustained Release 24 hr       Return in about 1 year (around 08/27/2018) for Dr Higinio Roger.    Electronically signed by     Cristal Deer, APRN  08/27/2017, 15:33    The plan above was discussed with me and I am in agreement.    Garlan Fair, MD

## 2017-09-11 ENCOUNTER — Encounter (HOSPITAL_BASED_OUTPATIENT_CLINIC_OR_DEPARTMENT_OTHER): Payer: Self-pay | Admitting: Family Medicine

## 2017-09-11 ENCOUNTER — Encounter (HOSPITAL_BASED_OUTPATIENT_CLINIC_OR_DEPARTMENT_OTHER): Payer: Self-pay

## 2017-09-11 ENCOUNTER — Ambulatory Visit
Admission: RE | Admit: 2017-09-11 | Discharge: 2017-09-11 | Disposition: A | Discharge status: Home / Self Care | Payer: Commercial Managed Care - PPO | Source: Ambulatory Visit | Attending: Nurse Practitioner | Admitting: Nurse Practitioner

## 2017-09-11 ENCOUNTER — Ambulatory Visit (HOSPITAL_BASED_OUTPATIENT_CLINIC_OR_DEPARTMENT_OTHER)
Admission: RE | Admit: 2017-09-11 | Discharge: 2017-09-11 | Disposition: A | Discharge status: Home / Self Care | Payer: Commercial Managed Care - PPO | Source: Ambulatory Visit | Attending: Nurse Practitioner | Admitting: Nurse Practitioner

## 2017-09-11 DIAGNOSIS — I517 Cardiomegaly: Secondary | ICD-10-CM | POA: Insufficient documentation

## 2017-09-11 DIAGNOSIS — Z6832 Body mass index (BMI) 32.0-32.9, adult: Secondary | ICD-10-CM | POA: Insufficient documentation

## 2017-09-11 DIAGNOSIS — M5136 Other intervertebral disc degeneration, lumbar region: Secondary | ICD-10-CM | POA: Insufficient documentation

## 2017-09-11 DIAGNOSIS — I6523 Occlusion and stenosis of bilateral carotid arteries: Secondary | ICD-10-CM

## 2017-09-11 DIAGNOSIS — M549 Dorsalgia, unspecified: Secondary | ICD-10-CM | POA: Insufficient documentation

## 2017-09-11 DIAGNOSIS — I6789 Other cerebrovascular disease: Secondary | ICD-10-CM

## 2017-09-11 DIAGNOSIS — F419 Anxiety disorder, unspecified: Secondary | ICD-10-CM | POA: Insufficient documentation

## 2017-09-11 DIAGNOSIS — Z9289 Personal history of other medical treatment: Secondary | ICD-10-CM | POA: Insufficient documentation

## 2017-09-11 DIAGNOSIS — I219 Acute myocardial infarction, unspecified: Secondary | ICD-10-CM | POA: Insufficient documentation

## 2017-09-11 DIAGNOSIS — Z6835 Body mass index (BMI) 35.0-35.9, adult: Secondary | ICD-10-CM | POA: Insufficient documentation

## 2017-09-11 DIAGNOSIS — G8929 Other chronic pain: Secondary | ICD-10-CM | POA: Insufficient documentation

## 2017-09-11 DIAGNOSIS — I6529 Occlusion and stenosis of unspecified carotid artery: Secondary | ICD-10-CM | POA: Insufficient documentation

## 2017-09-14 LAB — TRANSTHORACIC ECHOCARDIOGRAM - ADULT
AV LVOT peak gradient: 3 mmHg
Ao root annulus: 3.1 cm
E wave decelartion time: 278 ms
E/A ratio: 0.9
E/E' ratio: 7.6
LA Volume Index: 17.6 mL/m2
LA size: 4.3 cm
LA volume: 33 cm3
LVOT diameter: 2.1 cm
LVOT stroke volume: 68 cm3
Left Atrium to Aortic Root Ratio: 1.39
Left Ventricular Outflow Tract Peak Velocity: 88 cm/s
Left ant tibial dis sys ratio: 3.6 cm
Left ant tibial dis sys ratio: 3.6 cm
MV Peak A Vel: 86.9 cm/s
MV Peak E Vel: 78 cm/s
Pulmonic Valve Acceleration Time: 144 ms
RVDD: 2.96 cm
TDI: 10.3 cm/s

## 2017-09-15 ENCOUNTER — Telehealth (HOSPITAL_BASED_OUTPATIENT_CLINIC_OR_DEPARTMENT_OTHER): Payer: Self-pay | Admitting: Family Medicine

## 2017-09-15 ENCOUNTER — Encounter (HOSPITAL_BASED_OUTPATIENT_CLINIC_OR_DEPARTMENT_OTHER): Payer: Self-pay | Admitting: PHYSICIAN ASSISTANT

## 2017-09-15 ENCOUNTER — Other Ambulatory Visit (HOSPITAL_BASED_OUTPATIENT_CLINIC_OR_DEPARTMENT_OTHER): Payer: Self-pay

## 2017-09-15 ENCOUNTER — Ambulatory Visit (INDEPENDENT_AMBULATORY_CARE_PROVIDER_SITE_OTHER): Payer: Commercial Managed Care - PPO | Admitting: PHYSICIAN ASSISTANT

## 2017-09-15 ENCOUNTER — Ambulatory Visit: Payer: Commercial Managed Care - PPO | Attending: PHYSICIAN ASSISTANT

## 2017-09-15 VITALS — BP 92/65 | HR 85 | Temp 97.2°F | Resp 18 | Ht 69.0 in | Wt 212.8 lb

## 2017-09-15 DIAGNOSIS — Z6835 Body mass index (BMI) 35.0-35.9, adult: Secondary | ICD-10-CM

## 2017-09-15 DIAGNOSIS — E7800 Pure hypercholesterolemia, unspecified: Secondary | ICD-10-CM

## 2017-09-15 DIAGNOSIS — M549 Dorsalgia, unspecified: Secondary | ICD-10-CM

## 2017-09-15 DIAGNOSIS — M5136 Other intervertebral disc degeneration, lumbar region: Secondary | ICD-10-CM

## 2017-09-15 DIAGNOSIS — G8929 Other chronic pain: Secondary | ICD-10-CM

## 2017-09-15 DIAGNOSIS — Z0000 Encounter for general adult medical examination without abnormal findings: Secondary | ICD-10-CM

## 2017-09-15 DIAGNOSIS — Z951 Presence of aortocoronary bypass graft: Secondary | ICD-10-CM

## 2017-09-15 DIAGNOSIS — R52 Pain, unspecified: Secondary | ICD-10-CM

## 2017-09-15 DIAGNOSIS — Z1239 Encounter for other screening for malignant neoplasm of breast: Secondary | ICD-10-CM

## 2017-09-15 DIAGNOSIS — E119 Type 2 diabetes mellitus without complications: Secondary | ICD-10-CM

## 2017-09-15 DIAGNOSIS — F419 Anxiety disorder, unspecified: Secondary | ICD-10-CM

## 2017-09-15 DIAGNOSIS — I6529 Occlusion and stenosis of unspecified carotid artery: Secondary | ICD-10-CM

## 2017-09-15 DIAGNOSIS — Z9289 Personal history of other medical treatment: Secondary | ICD-10-CM

## 2017-09-15 LAB — MICROALBUMIN URINE, RANDOM: MICROALBUMIN RANDOM URINE: <0.6 mg/dL

## 2017-09-15 MED ORDER — DICLOFENAC 1 % TOPICAL GEL
2.0000 g | Freq: Four times a day (QID) | CUTANEOUS | 3 refills | Status: DC
Start: 2017-09-15 — End: 2018-07-13

## 2017-09-15 NOTE — Patient Instructions (Addendum)
Medicare Preventive Services  Medicare coverage information Recommendation for YOU   Heart Disease and Diabetes   Lipid profile every 5 years or more often if at risk for cardiovascular disease  Last Lipid Panel  (Last result in the past 2 years)      Cholesterol HDL LDL Direct LDL Triglycerides      10/23/16 1216 167  Comment:  Total Cholesterol Classification:     <200        Desirable        200-239     Borderline High        > or = 240  High 50  Comment:  HDL Cholesterol Classification:          <40        Low        > or = 60  High   84  Comment:  LDL Cholesterol Classification:         <100     Optimal       100-129  Near or Above Optimal       130-159  Borderline High       160-189  High       >190     Very High  165  Comment:  Triglyceride Classification:            <150    Normal       150-199 Borderline-High       200-499 High       >500    Very High          Diabetes Screening  yearly for those at risk for diabetes, 2 tests per year for those with prediabetes Last Glucose: 240    Diabetes Self Management Training or Medical Nutrition Therapy  for those with diabetes, up to 10 hrs initial training within a year, subsequent years up to 2 hrs of follow up training Optional for those with diabetes     Medical Nutrition Therapy three hours of one-on-one counseling in first year, two hours in subsequent years Optional for those with diabetes, kidney disease   Intensive Behavioral Therapy for Obesity  Face-to-face counseling, first month every week, month 2-6 every other week, month 7-12 every month if continued progress is documented Optional for those with Body Mass Index 30 or higher  Your Body mass index is 31.43 kg/(m^2).   Tobacco Cessation (Quitting) Counseling   two attempts per year, max 4 sessions per attempt, up to 8 per year, for those with tobacco-related health condition Optional for those that use tobacco   Cancer Screening   Colorectal screening   for anyone age 64 to 85 or any age if high risk:   . Screening Colonoscopy every 10 yrs if low risk,  more frequent if higher risk  OR  . Flexible  Sigmoidoscopy  every 5 yr OR  . Fecal Occult Blood Testing yearly OR  . Cologuard Stool DNA test once every 3 years OR  . CT Colonography every 5 yrs    Cologuard completed 04/29/2017   Screening Pap Test recommended every 3 years for all women age 64 to 36, or every five years if combined with HPV test (routine screening not needed after total hysterectomy).  Medicare covers every 2 years, up to yearly if high risk.  Screening Pelvic Exam Medicare covers every 2 years, yearly if high risk or childbearing age with abnormal Pap in last 3 yrs. Schedule with Lauren at next visit.  Screening Mammogram   Recommended every 1-2 years for women age 53 to 64, and selectively recommended for women between 90-49 based on shared decisions about risk. Covered by Medicare up to every year for women age 81 or older Ordered today   Lung Cancer Screening  Annual low dose computed tomography (LDCT scan) is recommended for those age 28-77 who smoked 30 pack-years and are current smokers or quit smoking within past 15 years (one pack-year= smoking one PPD for one year), after counseling by your doctor or nurse clinician about the possible benefits or harms Deferred.    Vaccinations   Pneumococcal Vaccine recommended routinely age 42+ with two separate vaccines one year apart (Prevnar then Pneumovax).  Recommended before age 62 if medical conditions increase risk  Seasonal Influenza Vaccine once every flu season   Hepatitis B Vaccine 3 doses if risk (including anyone with diabetes or liver disease)  Shingles Vaccine Once or twice at age 76 or older  Diphtheria Tetanus Pertussis Vaccine ONCE as adult, booster every 10 years  Influenza- received this year.     Immunization History   Administered Date(s) Administered   . Influenza Vaccine IM (ADMIN) 08/08/2010, 10/13/2012   . Pneumovax 10/02/2010     Shingles vaccine and Diphtheria Tetanus  Pertussis vaccines are available at pharmacies or local health department without a prescription.   Other Screening   Bone Densitometry   every 24 months for anyone at risk, including postmenopausal    Deferred.    Glaucoma Screening   yearly if in high risk group such as diabetes, family history, African American age 100+ or Hispanic American age 83+   Encouraged pt to get appt with eye dr.    Hepatitis C Screening recommended ONCE for those born between 1945-1965, or high risk for HCV infection  Completed previously   HIV Testing   yearly or up to 3 times in pregnancy  completed previously   Abdominal Aortic Aneurysm Screening Ultrasound   once with Initial Welcome to Medicare Physical within 12 months of coverage if  44 + with family history of AAA  CT chest completed.      Your Personalized Schedule for Preventive Tests   Health Maintenance: Pending and Last Completed       Date Due Completion Date    HIV Screening Completed in previous years ---    Adult Tdap-Td (1 - Tdap) >10 years. Will consider at later day with cut/bite.  ---    Pap smear Overdue. Will schedule with Lauren ---    Hepatitis C screening antibody Completed in previos years ---    Annual Wellness Exam 09/15/2018 09/15/2017    Shingles Vaccine (1 of 2) Never had. Will consider at a later date.  ---    Mammography Overdue. Ordered today.  12/17/2012    Depression Screening 09/15/2018 09/15/2017    Fecal DNA Test 04/29/2020 04/29/2017             Prevention Guidelines, Women Ages 1 to 1  Screening tests and vaccines are an important part of managing your health. A screening test is done to find possible disorders or diseases in people who don't have any symptoms. The goal is to find a disease early so lifestyle changes can be made and you can be watched more closely to reduce the risk of disease, or to detect it early enough to treat it most effectively. Screening tests are not considered diagnostic, but are used to determine if more testing is  needed.Health  counseling is essential, too. Below are guidelines for these, for women ages 59 to 7. Talk with your healthcare provider to make sure you're up to date on what you need.  Screening Who needs it How often   Type 2 diabetes or prediabetes All women beginning at age 64 and women without symptoms at any age who are overweight or obese and have 1 or more additional risk factors for diabetes. At least every 3 years   Type 2 diabetes or prediabetes All women diagnosed with gestational diabetes Lifelong testing every 3 years   Type 2 diabetes All women with prediabetes Every year   Alcohol misuse All women in this age group At routine exams   Blood pressure All women in this age group Yearly checkup if your blood pressure is normal  Normal blood pressure is less than 120/80 mm Hg  If your blood pressure reading is higher than normal, follow the advice of your healthcare provider   Breast cancer All women at average risk in this age group Yearly mammogram should be done until age 54. At age 46, switch to mammograms every other year or choose to continue yearly mammograms.  All women should be familiar with the potential benefits and risks of breast cancer screening with mammograms.     Cervical cancer All women in this age group, except women who have had a complete hysterectomy Pap test every 3 years or Pap test with human papillomavirus (HPV) test every 5 years   Chlamydia Women at increased risk for infection At routine exams   Colorectal cancer All women in this age group Flexible sigmoidoscopy every 5 years, or colonoscopy every 10 years, or double-contrast barium enema every 5 years; yearly fecal occult blood test or fecal immunochemical test; or a stool DNA test as often as your health care provider advises; talk with your health care provider about which tests are best for you   Depression All women in this age group At routine exams   Gonorrhea Sexually active women at increased risk for infection  At routine exams   Hepatitis C Anyone at increased risk; 1 time for those born between [birth year] and 1967 At routine exams   High cholesterol or triglycerides All women in this age group who are at risk for coronary artery disease At least every 5 years   HIV All women At routine exams   Lung cancer Adults age 33 to 44 who have smoked Yearly screening in smokers with 30 pack-year history of smoking or who quit within 15 years   Obesity All women in this age group At routine exams   Osteoporosis Women who are postmenopausal Ask your healthcare provider   Syphilis Women at increased risk for infection - talk with your healthcare provider At routine exams   Tuberculosis Women at increased risk for infection - talk with your healthcare provider Ask your healthcare provider   Vision All women in this age group Ask your healthcare provider   Vaccine Who needs it How often   Chickenpox (varicella) All women in this age group who have no record of this infection or vaccine 2 doses; the second dose should be given at least 4 weeks after the first dose   Hepatitis A Women at increased risk for infection - talk with your healthcare provider 2 doses given at least 6 months apart   Hepatitis B Women at increased risk for infection - talk with your healthcare provider 3 doses over 6 months; second dose  should be given 1 month after the first dose; the third dose should be given at least 2 months after the second dose and at least 4 months after the first dose   Haemophilus influenzaeType B (HIB) Women at increased risk for infection - talk with your healthcare provider 1 to 3 doses   Influenza (flu) All women in this age group Once a year   Measles, mumps, rubella (MMR) Women in this age group through their late 19s who have no record of these infections or vaccines 1 dose   Meningococcal Women at increased risk for infection - talk with your healthcare provider 1 or more doses   Pneumococcal conjugate vaccine (PCV13) and pneumococcal  polysaccharide vaccine (PPSV23) Women at increased risk for infection - talk with your healthcare provider PCV13: 1 dose ages 78 to 2 (protects against 13 types of pneumococcal bacteria)  PPSV23: 1 to 2 doses through age 30, or 1 dose at 17 or older (protects against 23 types of pneumococcal bacteria)   Tetanus/diphtheria/pertussis (Td/Tdap) booster All women in this age group Td every 10 years, or a one-time dose of Tdap instead of a Td booster after age 90, then Td every 10 years   Zoster All women ages 39 and older 1 dose   Counseling Who needs it How often   BRCA gene mutation testing for breast and ovarian cancer susceptibility Women with increased risk for having gene mutation When your risk is known   Breast cancer and chemoprevention Women at high risk for breast cancer When your risk is known   Diet and exercise Women who are overweight or obese When diagnosed, and then at routine exams   Sexually transmitted infection prevention Women at increased risk for infection - talk with your healthcare provider At routine exams   Use of daily aspirin Women ages 30 and up in this age group who are at risk for cardiovascular health problems such as stroke When your risk is known   Use of tobacco and the health effects it can cause All women in this age group Every exam   [place]  Date Last Reviewed: 01/02/2015   2000-2018 [employer]. [address], [place] [address]. All rights reserved. This information is not intended as a substitute for professional medical care. Always follow your healthcare professional's instructions.        Preventing Falls in the Home  An adult or child can fall for many reasons. If you are an older adult, you may fall because your reaction time slows down. Your muscles and joints may get stiff, weak, or less flexible because of illness, medicines, or a physical condition. These things can also make a child more likely to fall or be injured in a fall.   Other health problems that make falls more likely include:   Arthritis   Dizziness or lightheadedness when you get out of bed (orthostatic hypotension)   History of a stroke   Dizziness   Anemia   Certain medicines taken for mental illness   Problems with balance or gait   History of falls with or without an injury   Changes in vision (vision impairment)   Changes in thinking skills and memory (cognitive impairment)  Injuries from a fall can include broken bones, dislocated joints, and cuts. When these injuries are serious enough, they can make it impossible for you or a child who is injured in a fall to live on his or her own.  Prevention tips  To help prevent falls and fall-related injuries, follow the tips below.  Floors  Make floors safer by doing the following:   Put nonskid pads under area rugs.   Remove throw rugs.   Replace worn floor coverings.   Tack carpets firmly to each step on carpeted stairs. Put nonskid strips on the edges of uncarpeted stairs.   Keep floors and stairs free of clutter and cords.   Arrange furniture so there are clear pathways.   Clean up any spills right away.   Wear shoes that fit.  Bathrooms    Make bathrooms safer by doing the following:   Install grab bars in the tub or shower.   Apply nonskid strips or put a nonskid rubber mat in the tub or shower.   Sit on a bath chair to bathe.   Use bathmats with nonskid backing.  Lighting and the environment  Improve lighting in your home by doing the following:   Keep a flashlight in each room. Or put a lamp next to the bed within easy reach.   Put nightlights in the bedrooms, hallways, kitchen, and bathrooms.   Make sure all stairways have good lighting.   Take your time when going up and down stairs.   Put handrails on both sides of stairs and in walkways for more support. To prevent injury to your wrist or arm, don't use handrails to pull yourself up.   Install grab bars to pull yourself up.   Move or  rearrange items that you use often. This will make them easier to find or reach.   Look at your home to find any safety hazards. Especially look at doorways, walkways, and the driveway. Remove or repair any safety problems that you find.  Date Last Reviewed: 07/09/2015   2000-2018 [employer]. [address] [place] [address]. All rights reserved. This information is not intended as a substitute for professional medical care. Always follow your healthcare professional's instructions.        Taking [place] is given tohelp treat or prevent illness. But if you don't take it correctly, it might not help. It might even harm you. Your healthcare provider or pharmacist can help you learn the right way to take your medicine. Listed below are some tips to help you take medicine safely.  Safety tips  Do's and don'ts include the following:   Have a routine for taking each medicine. Make it part of something you do each day, such as brushing your teeth or eating a meal.   When you go to the hospital or your healthcare provider's office, bring all your current medicines in their original boxes or bottles. If you can't do that,bring an up-to-date list of your medicines.   Don't stop taking a prescription medicine unless your healthcare provider tells you to. Doing so could make your condition worse.   Don't share medicines.   Let your healthcare provider and pharmacist know of any allergies you have and if there have been any changes in your health since you were last prescribed these medicines.   Taking prescription medicines with alcohol, street drugs, herbs, supplements, or even some over-the-counter medicines can be harmful. Talk to your healthcare provider or pharmacist before using any of these things while taking a prescription medicine.   When filling your prescriptions, try using the same pharmacy for all your medicines. If not, let the pharmacist know what medicines  you are  already on.   Consider putting a week's worth of medicines in a container marked for each day of the week.   Keep medicines out of the reach of children and pets. Be sure all medicine bottles have safety caps.   Keep narcotics and sedatives out of sight or in a locked box or safe.   Keep your medicines in a dry and cool location (not in the bathroom.)   Don't use medicine that has expired or that doesn't look or smell right. Get rid of it properly. To find out the right way to get rid of medicine:   Call your city or county government's household trash and recycling service and ask if a medicine take-back program is available in your community.   Call your local pharmacy and ask the right way to get rid of the medicine.   Go to https://carter-cox.org/ to learn how to get rid of medicines safely.   Medicine that comes in a container for a single dose should be used only 1 time. If you use the container a second time, it may have germs in it that can cause illness. These illnesses include hepatitis B and C. They also include infections of the brain or spinal cord (meningitis and epidural abscess).  Using generic medicines  Medicines have brand names and generic (chemical) names. When a medicine is first made, it is sold only under its brand name. Later, it can be made and sold as a generic. Generic medicines cost less than brand-name medicines and most work just as well. Most people can use the generic medicine instead of the brand-name medicine, unless their healthcare provider says otherwise.   Date Last Reviewed: 07/09/2015   2000-2017 [employer], [place] [address]. All rights reserved. This information is not intended as a substitute for professional medical care. Always follow your healthcare professional's instructions.        A1C  Does this test have other names?  Hemoglobin A1c; HbA1c; glycosylated hemoglobin; glycohemoglobin;  Glycated hemoglobin  What is this test?  A1C is a blood test that shows average blood sugar (glucose) levels over the last 3 months. The test is done to find out if a person has diabetes or prediabetes. It's also used to see how well a person with diabetes controls their blood sugar. The test can help guide diabetes treatment over time.  Why do I need this test?  You may need this test to check for prediabetes or diabetes.  If you have diabetes or prediabetes, you may need this test to see how well you control your blood sugar. People with diabetes need to track their blood sugar (glucose) levels every day to make sure they aren't too high or too low. The A1C test gives results for a longer period of time. It shows if your blood sugar has been too high on average over the last 3 months.  Glucose sticks to hemoglobin in the blood. Hemoglobin is a protein in red blood cells that carries oxygen. When blood sugar is high, more glucose builds up and sticks to the hemoglobin. The A1C test measures how much of the hemoglobin is coated with sugar.  You may have the test when a healthcare provider first works with you to treat your diabetes. You may then need to have the A1C test 2 or more times a year. This depends on the type of diabetes and how well it's controlled. The American Diabetes Association (ADA)advises an A1C  test at least 2 times a year if you are meeting your blood sugar goals. If you aren't meeting your goals or your medicine has changed, you should have the A1C test more often.  What other tests might I have along with this test?  If your healthcare provider tests you for diabetes, you may also have any of these tests:   Fasting plasma glucose blood test (FPG)   Oral glucose tolerance test (OGTT)   Urine test to check for sugar, ketones, or protein  What do my test results mean?  Test results may vary depending on your age, gender, health history, the method used for the test, and other things. Your  test results may not mean you have a problem. Ask your healthcare provider what your test results mean for you.  A1C results are reported as a percentage. Here are what the results mean:   A1C below 5.7%. This is normal.   A1C from 5.7% to 6.4%. You may have prediabetes. This means you have a higher risk for diabetes in the future.   A1C of 6.5% or above on 2 separate tests. You may have diabetes.  The ADA says that people with diabetes should keep an A1C below 7%. The American Association of Clinical Endocrinologists advises an A1C of 6.5% or less. Your healthcare provider may give you other advice. This is based on your age, health conditions, and other things.  How is this test done?  The test is done with a blood sample. A needle is used to draw blood from a vein in your arm or hand.  Does this test pose any risks?  Having a blood test with a needle carries some risks. These include bleeding, infection, bruising, and feeling lightheaded. When the needle pricks your arm or hand, you may feel a slight sting or pain. Afterward, the site may be sore.  What might affect my test results?  Your blood sugar levels change throughout the day. This won't affect the A1C test result.  If you have sickle cell anemia or other blood disorders, an A1C test may be less useful for diagnosing or watching diabetes. Your healthcare provider may tell you to use a different test that will work better for you.  The test results may be less accurate if you have any of the below:   Anemia   Heavy bleeding   Iron deficiency   Kidney failure   Liver disease  How do I get ready for this test?  You don't need to get ready for the test.   2000-2018 [employer]. [address] [place] [address]. All rights reserved. This information is not intended as a substitute for professional medical care. Always follow your healthcare professional's instructions.        Diabetic Retinopathy: Evaluating Your Eyes      Your eye healthcare provider examines your eyes with a slit lamp.   Diabetic retinopathy is a condition that happens when diabetes damages blood vessels in the rear of the eye. It can lead to vision loss. To help catch it early, have a complete dilated eye exam at least once a year. During the exam, the eye healthcare provider will review your medical history, examine your eyes, and check your vision.  Women who are pregnant and have pre-existing type 1 or type 2 diabetes have an increased risk of retinopathy. Women with diabetes should have an eye exam before pregnancy or in the first trimester. They should continue  to be monitored every trimester and for 1 year after delivery, depending on the severity of the retinopathy.  Your medical history  Your eye healthcare provider may ask about the following:   Your diabetes type, history, treatments (such as insulin), and how you monitor your blood sugar level   Your family's health, including whether any relative has had diabetes or diabetic retinopathy   Any diseases, surgeries, or other medical procedures you've had   Any medicines, herbs, or supplements you use, including those you buy over the counter   Any problems with your vision you may be having, such as blurred or double vision, problems seeing at night, or flashes or floaters  Your eye exam  Your eye healthcare provider uses an eye chart and other tools to check your vision. Then he or she examines your eyes for signs of disease. You are given eye drops to widen (dilate) your pupils. You may have one or more of the following tests:   Tonometryto measure fluid pressure inside the eye.   Slit lamp examto allow the healthcare provider to view the structures of your eye.   Ultrasoundto create an image of the eye using sound waves. Ultrasound may be used if blood is found in the clear gel that fills the eye (vitreous).   Ocular coherence tomography (OCT) to create an image of the retina using light  waves. This shows if there is fluid leaking into certain parts of the eye. It can also measure the thickness of the retina.  Fluorescein angiography  This test may be done to check the health of the inside lining of the eye (retina). It also checks the tiny blood vessels (capillaries) that carry blood to the retina. During the test:   Photographs are taken of the retina.   A dye is then injected into the bloodstream through the arm or hand. The dye travels to the capillaries in the eye.   More photographs are taken of the retina. The dye causes the capillaries to stand out on the photographs.  You may feel brief nausea during the procedure. For a few hours after the test, your skin, eyes, and urine may appear yellow. Talk with your healthcare provider for more information about this test.  Date Last Reviewed: 05/09/2015   2000-2018 [employer]. [address] [place] [address]. All rights reserved. This information is not intended as a substitute for professional medical care. Always follow your healthcare professional's instructions.        Mammography    Mammographyis an X-ray exam of your breast tissue. The image it makes is called a mammogram. A mammogram can help find problems with your breasts,such as cysts or cancer. Mammography is the best breast cancer screening tool available.  Have screening mammograms and professional breast exams as often as your healthcare provider recommends. Also, be sure you know how your breasts normally look and feel. This makes it easier to notice any changes. Report changes to your healthcare provider as soon as possible.   How do I get ready for a mammogram?   Schedule the test for 1 week after your period.Your breasts are less sore then.   Make sure your clinic getsimages of your last mammogram if it was done somewhere else. This lets the provider compare the 2 sets of images for any changes.   On the morning of your test,don't use  deodorant,powder,or perfume.   Wear a top that you can take off easily.  What happens during a mammogram?   You will need to undress from the waist up.   The technologist will position your breast to get the best test results.   Each of your breasts will be compressed one at a time. This helps get the most complete X-ray image.   Your breasts will be repositioned to get at least 2 separate views of each breast.  What happens after a mammogram?   More X-rays are sometimes needed. If not done at the time of your initial mammogram, you'll be called to schedule them.   You should receive your test results in writing. Ask about this on the day of your appointment.   Have mammograms as often as your healthcare provider recommends.  Let the technologist know if:   You're pregnant or think you may be pregnant   You have breast implants   You have any scars or moles on or near your breasts   You've had a breast biopsy or surgery   You're breastfeeding   Date Last Reviewed: 05/08/2016   2000-2018 [employer]. [address] [place] [address]. All rights reserved. This information is not intended as a substitute for professional medical care. Always follow your healthcare professional's instructions.

## 2017-09-15 NOTE — Telephone Encounter (Signed)
Notified patient of results of Urine Microalbumin. Monique [name] verbalized understanding of results.

## 2017-09-15 NOTE — Telephone Encounter (Signed)
-----   Message from Chryl Heck, PA-C sent at 09/15/2017  1:13 PM EDT -----  Please inform patient that her urine protein was good.   Repeat in one year for screening.   TY LA

## 2017-09-15 NOTE — Progress Notes (Deleted)
[place], [place]  [address]  [place] [address]    Medicare Annual Wellness Visit    Name: Monique [name] MRN:  [id number]   Date: 09/15/2017 Age: 51 y.o.     Chief Complaint   Patient presents with   . Medicare Annual     [id number] sleep as good. Appetite good, less since gastric sleeve surgery, but has lost 100 lbs since march 2018.  Bowels moving well. Making urine well Pain 0/10. headache x 2-3 days.        HPI:  The history is provided by the patient and medical records. No language interpreter was used.       SUBJECTIVE:   Monique [name] is a 51 y.o. female presenting for a Medicare Wellness exam.   I have reviewed and reconciled the medication list with the patient today.    I have reviewed and updated as appropriate the past medical, family, and social history. 09/15/2017 as summarized below:  Past Medical History:   Diagnosis Date   . Anxiety    . BMI 35.0-35.9,adult    . Carotid stenosis    . Chronic back pain    . Coronary artery disease    . DDD (degenerative disc disease), lumbar     "entire spine"   . Ear piercing    . GERD (gastroesophageal reflux disease)    . H/O complete eye exam     2 years, Dr. [name], at [facility]   . History of dental examination     dentures upper and lower   . HTN    . Hyperlipidemia    . Neck problem     herniated cervical disc   . Obesity    . Stroke (CMS [facility])     2002   . Tattoo     Left breast, Right shoulder   . Type 2 diabetes mellitus     Dx 1999 average fasting 70-89     Past Surgical History:   Procedure Laterality Date   . Bypass graft  2012   . Endometrial ablation  1998   . Hx ankle fracture tx     . Hx cataract removal     . Hx cesarean section  06/20/2000   . Hx cholecystectomy  1988   . Hx coronary stent placement  2012   . 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 Hx tonsillectomy       Current Outpatient Prescriptions   Medication Sig   . ACCU-CHEK AVIVA PLUS TEST STRP Strip 1 Strip by Subcutaneous route Three times a day as needed   .  BYDUREON 2 mg Subcutaneous    . clopidogrel (PLAVIX) 75 mg Oral Tablet Take 1 Tab (75 mg total) by mouth Once a day   . diclofenac sodium (VOLTAREN) 1 % Gel 2 g by Apply Topically route Four times a day - before meals and bedtime   . furosemide (LASIX) 40 mg Oral Tablet Take 1 Tab (40 mg total) by mouth Once a day   . HYDROcodone-acetaminophen (NORCO) 10-325 mg Oral Tablet Take 1 Tab by mouth Every 4 hours as needed   . Ibuprofen (MOTRIN) 800 mg Oral Tablet Take 800 mg by mouth Three times a day as needed for Pain   . lisinopril (PRINIVIL) 2.5 mg Oral Tablet take 1 tablet by mouth once daily   . metoprolol succinate (TOPROL-XL) 25 mg Oral Tablet Sustained Release 24 hr Take 0.5 Tabs (12.5 mg total) by  mouth Once a day   . niacin (NIASPAN) 500 mg Oral Tablet Sustained Release Take 1 Tab (500 mg total) by mouth Once a day   . nitroGLYCERIN (NITROSTAT) 0.4 mg Sublingual Tablet, Sublingual 1 Tab (0.4 mg total) by Sublingual route Every 5 minutes as needed for Chest pain for 3 doses over 15 minutes   . omeprazole (PRILOSEC) 40 mg Oral Capsule, Delayed Release(E.C.) take 1 capsule by mouth once daily   . ondansetron (ZOFRAN ODT) 8 mg Oral Tablet, Rapid Dissolve Take 1 Tab (8 mg total) by mouth Every 8 hours as needed for nausea/vomiting   . PRENATAL PLUS, CALCIUM CARB, 27 mg iron- 1 mg Oral Tablet Take 1 Tab by mouth Once a day   . rosuvastatin (CRESTOR) 20 mg Oral Tablet Take 1 Tab (20 mg total) by mouth Once a day   . VENTOLIN HFA 90 mcg/actuation Inhalation HFA Aerosol Inhaler Take 1-2 Puffs by inhalation Every 6 hours as needed (wheezing, cough)     Family Medical History:     Problem Relation (Age of Onset)    Atrial fibrillation Sister    Colon Cancer Father    Diabetes Mother    Heart Attack Mother    Heart Disease Sister, Maternal Grandfather    Hypertension Mother    Lung Cancer Maternal Grandfather            Social History     Social History   . Marital status: Married     Spouse name: [name]   . Number of  children: 3   . Years of education: completed 11th grade     Occupational History   . disabled      Social History Main Topics   . Smoking status: Current Every Day Smoker     Packs/day: 0.25     Years: 20.00     Types: Cigarettes     Last attempt to quit: 03/30/2011   . Smokeless tobacco: Never Used   . Alcohol use No   . Drug use: No   . Sexual activity: Yes     Partners: Male     Other Topics Concern   . Routine Exercise No   . Ability To Walk 2 Flight Of Steps Without Sob/Cp Yes     Social History Narrative    Living situation: lives at home with husband, [name], and daughter.     Nutrition:  Eats small meals d/t sleeve surgery.     Caffeine use: none, decaf coffee    Exercise: stays busy around house.     Seatbelt use: yes    Fire extinguishers in the home: yes    Smoke alarms in the home: yes    Carbon monoxide detectors in the home: yes    Nancy Fetter exposure:        [place]sa would like for husband, Acacia Latorre,  to speak for her in the event she would become incapacitated.    Full code.        Review of Systems   Constitutional: Negative.    HENT: Negative.    Eyes: Negative.    Respiratory: Negative.    Cardiovascular: Negative.    Gastrointestinal: Negative.    Endocrine: Negative.    Genitourinary: Negative.    Musculoskeletal: Positive for arthralgias, back pain and myalgias.        Chronic back pain, treats with Dr. Hortencia Pilar in [place], [place]   Skin: Negative.    Allergic/Immunologic: Negative.    Neurological: Negative.  Hematological: Negative.    Psychiatric/Behavioral: Negative.         List of Current Health Care Providers   Care Team     PCP     Name Type Specialty Phone Number    Cherly Beach, MD Physician [place] [phone]          Care Team     Name Type Specialty Phone Number    Cherly Beach, MD Physician [facility] [phone]    Garlan Fair, MD Physician CARDIOLOGY [phone]    Parthenia Ames, MD Not available GASTROENTEROLOGY [phone]                  Health  Maintenance   Topic Date Due   . HIV Screening  Completed previously   . Adult Tdap-Td (1 - Tdap) Will get with next bite/ cut   . Pap smear  To schedule with Lauren for next visit.    . Hepatitis C screening antibody  Completed previously   . Annual Wellness Exam  09/15/2018   . Shingles Vaccine (1 of 2) Never had. declined   . Mammography  Ordered at today's visit.    . Depression Screening  09/15/2018   . Fecal DNA Test  04/29/2020   . Influenza Vaccine  Completed   . Pneumococcal 19-64 Years Medium Risk  Completed     Medicare Wellness Assessment   Medicare initial or wellness physical in the last year?: No  Advance Directives (optional)   Does patient have a living will or MPOA: no   Has patient provided [employer] with a copy?: no   Advance directive information given to the patient today?: no      Activities of Daily Living   Do you need help with dressing, bathing, or walking?: No   Do you need help with shopping, housekeeping, medications, or finances?: No   Do you have rugs in hallways, broken steps, or poor lighting?: No   Do you have grab bars in your bathroom, non-slip strips in your tub, and hand rails on your stairs?: No   Urinary Incontinence Screen (Women >=65 only)       Cognitive Function Screen (1=Yes, 0=No)   What is you age?: Correct   What is the time to the nearest hour?: Correct   What is the year?: Correct   What is the name of this clinic?: Correct   Can the patient recognize two persons (the doctor, the nurse, home help, etc.)?: Correct   What is the date of your birth? (day and month sufficient) : Correct   In what year did World War II end?: Correct   Who is the current president of the [place]?: Correct   Count from 20 down to 1?: Correct   What address did I give you earlier?: Correct   Total Score: 10   Hearing Screen   Have you noticed any hearing difficulties?: No  After whispering 9-1-6 how many numbers did the patient repeat correctly?: 3  After whispering 4-7-8 how many  numbers did the patient repeat correctly?: 3   Fall Risk Screen   Do you feel unsteady when standing or walking?: No  Do you worry about falling?: No  Have you fallen in the past year?: No   Vision Screen   Right Eye = 20: 20 (OTC reading glasses)   Left Eye = 20: 20 (OTC reading glasses)   Depression Screen   Little interest or pleasure in doing things.: Not at  all  Feeling down, depressed, or hopeless: Not at all  PHQ 2 Total: 0  Trouble falling or staying asleep, or sleeping too much.: Not at all  Feeling tired or having little energy: Several Days  Poor appetite or overeating: Not at all  Feeling bad about yourself/ that you are a failure in the past 2 weeks?: Not at all  Trouble concentrating on things in the past 2 weeks?: Not at all  Moving/Speaking slowly or being fidgety or restless  in the past 2 weeks?: Not at all  Thoughts that you would be better off DEAD, or of hurting yourself in some way.: Not at all  PHQ 9 Total: 1  Interpretation of Total Score: 0-4 No depression        OBJECTIVE:   BP 92/65  Pulse 85  Temp 36.2 C (97.2 F) (Oral)   Resp 18  Ht 1.753 m (5\' 9" )  Wt 96.5 kg (212 lb 12.8 oz)  SpO2 97%  Breastfeeding? No  BMI 31.43 kg/m2     Physical Exam    ASSESSMENT & PLAN:     Problem List Items Addressed This Visit        Cardiovascular System    Hypercholesterolemia    S/P CABG x 2    Carotid stenosis       Endocrine    Diabetes Mellitus, Type II       Musculoskeletal    DDD (degenerative disc disease), lumbar    Chronic back pain       Other    Anxiety    BMI 35.0-35.9,adult    H/O complete eye exam    History of dental examination      Other Visit Diagnoses     Medicare annual wellness visit, initial    -  Primary    Breast cancer screening              Diabetes Monitors  A1C: 8.3  A1C Date: 08/18/2017          Urine Microalbumin: Not Found   Microalbumin Date: Not Found ordered at today's visit.    Last Lipid Panel  (Last result in the past 2 years)      Cholesterol HDL LDL Direct LDL  Triglycerides      10/23/16 1216 167  Comment:  Total Cholesterol Classification:     <200        Desirable        200-239     Borderline High        > or = 240  High 50  Comment:  HDL Cholesterol Classification:          <40        Low        > or = 60  High   84  Comment:  LDL Cholesterol Classification:         <100     Optimal       100-129  Near or Above Optimal       130-159  Borderline High       160-189  High       >190     Very High  165  Comment:  Triglyceride Classification:            <150    Normal       150-199 Borderline-High       200-499 High       >500    Very High  Retinal Exam Date: Not Found encouraged pt to reconnect with Dr. Cecilie Lowers  Last diabetic foot exam: Not Found Please perform at next office visit.      Identified Risk Factors/ Recommended Actions   BMI addressed: Advised on diet, weight loss, and exercise to reduce above normal BMI. Pt continues to lose weight since gastric sleeve surgery. Doing well except is having trouble getting appropriate amt of protein daily.     Depression screening follow up plan of care: Follow up Dr. Russ Halo as needed. PHQ9 scored 1 today. No depression    High Risk Medications were discussed with the patient: Plavix. Discussed the risks associated with the medications - falls and bleeding. Discussed the benefits vs risks of each medication with the patient and patient chooses to continue medications as prescribed.    No orders of the defined types were placed in this encounter.         Preventative health care was explained in detail. Age appropriate vaccinations and cancer screenings were described in detail. We discussed fall prevention and ways to ensure home safety. We discussed smoke detectors, carbon monoxide detectors, and the need for a fire extinguisher within the home. We discussed health maintenance screenings that are applicable at this time. We discussed medication compliance and have attempted to simplify medications as much as  possible. Medical living will and power of attorney was discussed. Patient was encouraged to wear seatbelt and exhibit car safety. Driving safety was discussed. Exercise and healthy eating was discussed. Patient was told about Calcium and Vitamin D recommendations based on current age and gender.     The patient has been educated about risk factors and recommended preventive care. Written Prevention Plan completed/updated and given to patient (see After Visit Summary).    Next office visit: Review and discuss labs. Re-evaluate chronic medical conditions.     Return in about 1 year (around 09/15/2018) for Medicare wellness 2019.    Jerrel Ivory, RN  CCM [facility], [place]  [address]  [place] [address]  [phone]

## 2017-09-15 NOTE — Progress Notes (Signed)
[place], [place]  [address]  [place] [address]    Medicare Annual Wellness Visit    Name: Monique [name] MRN:  [id number]   Date: 09/15/2017 Age: 51 y.o.     Chief Complaint   Patient presents with   . Medicare Annual     [id number] sleep as good. Appetite good, less since gastric sleeve surgery, but has lost 100 lbs since march 2018.  Bowels moving well. Making urine well Pain 0/10. headache x 2-3 days.  Also requesting a prescription for a new glucometer.        HPI:  The history is provided by the patient and medical records. No language interpreter was used.       SUBJECTIVE:   Monique [name] is a 51 y.o. female presenting for a Medicare Wellness exam.   I have reviewed and reconciled the medication list with the patient today.    I have reviewed and updated as appropriate the past medical, family, and social history. 09/15/2017 as summarized below:  Past Medical History:   Diagnosis Date   . Anxiety    . BMI 35.0-35.9,adult    . Carotid stenosis    . Chronic back pain    . Coronary artery disease    . DDD (degenerative disc disease), lumbar     "entire spine"   . Ear piercing    . GERD (gastroesophageal reflux disease)    . H/O complete eye exam     2 years, Dr. [name], at [facility]   . History of dental examination     dentures upper and lower   . HTN    . Hyperlipidemia    . Neck problem     herniated cervical disc   . Obesity    . Stroke (CMS [facility])     2002   . Tattoo     Left breast, Right shoulder   . Type 2 diabetes mellitus     Dx 1999 average fasting 70-89     Past Surgical History:   Procedure Laterality Date   . Bypass graft  2012   . Endometrial ablation  1998   . Hx ankle fracture tx     . Hx cataract removal     . Hx cesarean section  06/20/2000   . Hx cholecystectomy  1988   . Hx coronary stent placement  2012   . 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 Hx tonsillectomy       Current Outpatient Prescriptions   Medication Sig   . ACCU-CHEK AVIVA PLUS TEST STRP Strip 1 Strip  by Subcutaneous route Three times a day as needed   . BYDUREON 2 mg Subcutaneous    . clopidogrel (PLAVIX) 75 mg Oral Tablet Take 1 Tab (75 mg total) by mouth Once a day   . diclofenac sodium (VOLTAREN) 1 % Gel 2 g by Apply Topically route Four times a day - before meals and bedtime   . furosemide (LASIX) 40 mg Oral Tablet Take 1 Tab (40 mg total) by mouth Once a day   . HYDROcodone-acetaminophen (NORCO) 10-325 mg Oral Tablet Take 1 Tab by mouth Every 4 hours as needed   . Ibuprofen (MOTRIN) 800 mg Oral Tablet Take 800 mg by mouth Three times a day as needed for Pain   . lisinopril (PRINIVIL) 2.5 mg Oral Tablet take 1 tablet by mouth once daily   . metoprolol succinate (TOPROL-XL) 25 mg Oral Tablet Sustained Release  24 hr Take 0.5 Tabs (12.5 mg total) by mouth Once a day   . niacin (NIASPAN) 500 mg Oral Tablet Sustained Release Take 1 Tab (500 mg total) by mouth Once a day   . nitroGLYCERIN (NITROSTAT) 0.4 mg Sublingual Tablet, Sublingual 1 Tab (0.4 mg total) by Sublingual route Every 5 minutes as needed for Chest pain for 3 doses over 15 minutes   . omeprazole (PRILOSEC) 40 mg Oral Capsule, Delayed Release(E.C.) take 1 capsule by mouth once daily   . ondansetron (ZOFRAN ODT) 8 mg Oral Tablet, Rapid Dissolve Take 1 Tab (8 mg total) by mouth Every 8 hours as needed for nausea/vomiting   . PRENATAL PLUS, CALCIUM CARB, 27 mg iron- 1 mg Oral Tablet Take 1 Tab by mouth Once a day   . rosuvastatin (CRESTOR) 20 mg Oral Tablet Take 1 Tab (20 mg total) by mouth Once a day   . VENTOLIN HFA 90 mcg/actuation Inhalation HFA Aerosol Inhaler Take 1-2 Puffs by inhalation Every 6 hours as needed (wheezing, cough)     Family Medical History:     Problem Relation (Age of Onset)    Atrial fibrillation Sister    Colon Cancer Father    Diabetes Mother    Heart Attack Mother    Heart Disease Sister, Maternal Grandfather    Hypertension Mother    Lung Cancer Maternal Grandfather            Social History     Social History   . Marital  status: Married     Spouse name: [name]   . Number of children: 3   . Years of education: completed 11th grade     Occupational History   . disabled      Social History Main Topics   . Smoking status: Current Every Day Smoker     Packs/day: 0.25     Years: 20.00     Types: Cigarettes     Last attempt to quit: 03/30/2011   . Smokeless tobacco: Never Used   . Alcohol use No   . Drug use: No   . Sexual activity: Yes     Partners: Male     Other Topics Concern   . Routine Exercise No   . Ability To Walk 2 Flight Of Steps Without Sob/Cp Yes     Social History Narrative    Living situation: lives at home with husband, [name], and daughter.     Nutrition:  Eats small meals d/t sleeve surgery.     Caffeine use: none, decaf coffee    Exercise: stays busy around house.     Seatbelt use: yes    Fire extinguishers in the home: yes    Smoke alarms in the home: yes    Carbon monoxide detectors in the home: yes    Nancy Fetter exposure:        Odelia would like for husband, Jaima Janney,  to speak for her in the event she would become incapacitated.    Full code.        Review of Systems   Constitutional: Negative.    HENT: Negative.    Eyes: Negative.    Respiratory: Negative.    Cardiovascular: Negative.    Gastrointestinal: Negative.    Endocrine: Negative.    Genitourinary: Negative.    Musculoskeletal: Positive for arthralgias, back pain and myalgias.        Chronic back pain, treats with Dr. Hortencia Pilar in [place], [place]   Skin: Negative.  Allergic/Immunologic: Negative.    Neurological: Negative.    Hematological: Negative.    Psychiatric/Behavioral: Negative.         List of Current Health Care Providers   Care Team     PCP     Name Type Specialty Phone Number    Cherly Beach, MD Physician [place] [phone]          Care Team     Name Type Specialty Phone Number    Cherly Beach, MD Physician FAMILY PRACTICE [phone]    Garlan Fair, MD Physician CARDIOLOGY [phone]    Parthenia Ames, MD Not available  GASTROENTEROLOGY [phone]    Rosaria Ferries., MD Not available PAIN MANAGEMENT [phone]                  Health Maintenance   Topic Date Due   . HIV Screening  Completed previously   . Adult Tdap-Td (1 - Tdap) Will get with next bite/ cut   . Pap smear  To schedule with Tenika Keeran for next visit.    . Hepatitis C screening antibody  Completed previously   . Annual Wellness Exam  09/15/2018   . Shingles Vaccine (1 of 2) Never had. declined   . Mammography  Ordered at today's visit.    . Depression Screening  09/15/2018   . Fecal DNA Test  04/29/2020   . Influenza Vaccine  Completed   . Pneumococcal 19-64 Years Medium Risk  Completed     Medicare Wellness Assessment   Medicare initial or wellness physical in the last year?: No  Advance Directives (optional)   Does patient have a living will or MPOA: no   Has patient provided [employer] with a copy?: no   Advance directive information given to the patient today?: no      Activities of Daily Living   Do you need help with dressing, bathing, or walking?: No   Do you need help with shopping, housekeeping, medications, or finances?: No   Do you have rugs in hallways, broken steps, or poor lighting?: No   Do you have grab bars in your bathroom, non-slip strips in your tub, and hand rails on your stairs?: No   Urinary Incontinence Screen (Women >=65 only)       Cognitive Function Screen (1=Yes, 0=No)   What is you age?: Correct   What is the time to the nearest hour?: Correct   What is the year?: Correct   What is the name of this clinic?: Correct   Can the patient recognize two persons (the doctor, the nurse, home help, etc.)?: Correct   What is the date of your birth? (day and month sufficient) : Correct   In what year did World War II end?: Correct   Who is the current president of the [place]?: Correct   Count from 20 down to 1?: Correct   What address did I give you earlier?: Correct   Total Score: 10   Hearing Screen   Have you noticed any hearing  difficulties?: No  After whispering 9-1-6 how many numbers did the patient repeat correctly?: 3  After whispering 4-7-8 how many numbers did the patient repeat correctly?: 3   Fall Risk Screen   Do you feel unsteady when standing or walking?: No  Do you worry about falling?: No  Have you fallen in the past year?: No   Vision Screen   Right Eye = 20: 20 (OTC reading glasses)   Left  Eye = 20: 20 (OTC reading glasses)   Depression Screen   Little interest or pleasure in doing things.: Not at all  Feeling down, depressed, or hopeless: Not at all  PHQ 2 Total: 0  Trouble falling or staying asleep, or sleeping too much.: Not at all  Feeling tired or having little energy: Several Days  Poor appetite or overeating: Not at all  Feeling bad about yourself/ that you are a failure in the past 2 weeks?: Not at all  Trouble concentrating on things in the past 2 weeks?: Not at all  Moving/Speaking slowly or being fidgety or restless  in the past 2 weeks?: Not at all  Thoughts that you would be better off DEAD, or of hurting yourself in some way.: Not at all  PHQ 9 Total: 1  Interpretation of Total Score: 0-4 No depression        OBJECTIVE:   BP 92/65  Pulse 85  Temp 36.2 C (97.2 F) (Oral)   Resp 18  Ht 1.753 m (5\' 9" )  Wt 96.5 kg (212 lb 12.8 oz)  SpO2 97%  Breastfeeding? No  BMI 31.43 kg/m2     Physical Exam  General: appears in good health, mildly obese, appears stated age, no distress and vital signs reviewed  Eyes: Conjunctiva clear., Pupils equal and round, reactive to light and accomodation. , Sclera non-icteric.   HENT:ENMT without erythema or injection, mucous membranes moist.  Neck: no thyromegaly or lymphadenopathy and supple, symmetrical, trachea midline  Lungs: Clear to auscultation bilaterally.   Cardiovascular: regular rate and rhythm, S1, S2 normal, no murmur, click, rub or gallop  Abdomen: Soft, non-tender, Bowel sounds normal, non-distended, No masses, surgical scars from gastric bypass surgery are  well-healed.  Extremities: extremities normal, atraumatic, no cyanosis or edema  Skin: Skin warm and dry  Neurologic: Alert and oriented X 3, normal strength and tone. Normal symmetric reflexes. Normal coordination and gait, No tremor  Lymphatics: No lymphadenopathy  Psychiatric: Normal affect, behavior, memory, thought content, judgement, and speech.    ASSESSMENT & PLAN:     Problem List Items Addressed This Visit        Cardiovascular System    Hypercholesterolemia    S/P CABG x 2    Carotid stenosis       Endocrine    Diabetes Mellitus, Type II    Relevant Orders    MICROALBUMIN URINE, RANDOM (Completed)    DME - GLUCOMETER       Musculoskeletal    DDD (degenerative disc disease), lumbar    Chronic back pain       Other    Anxiety    BMI 35.0-35.9,adult    H/O complete eye exam    History of dental examination      Other Visit Diagnoses     Medicare annual wellness visit, initial    -  Primary    Breast cancer screening        Relevant Orders    MAMMO BILATERAL SCREENING-ADDL VIEWS/BREAST US AS REQ BY RAD    Pain        Relevant Medications    diclofenac sodium (VOLTAREN) 1 % Gel          Diabetes Monitors  A1C: 8.3  A1C Date: 08/18/2017          Urine Microalbumin: 0.54   Microalbumin Date: 09/15/2017 ordered at today's visit.    Last Lipid Panel  (Last result in the past 2 years)  Cholesterol HDL LDL Direct LDL Triglycerides      10/23/16 1216 167  Comment:  Total Cholesterol Classification:     <200        Desirable        200-239     Borderline High        > or = 240  High 50  Comment:  HDL Cholesterol Classification:          <40        Low        > or = 60  High   84  Comment:  LDL Cholesterol Classification:         <100     Optimal       100-129  Near or Above Optimal       130-159  Borderline High       160-189  High       >190     Very High  165  Comment:  Triglyceride Classification:            <150    Normal       150-199 Borderline-High       200-499 High       >500    Very High         Retinal  Exam Date: Not Found encouraged pt to reconnect with Dr. Cecilie Lowers  Last diabetic foot exam: Not Found Please perform at next office visit.      Identified Risk Factors/ Recommended Actions   BMI addressed: Advised on diet, weight loss, and exercise to reduce above normal BMI. Pt continues to lose weight since gastric sleeve surgery. Doing well except is having trouble getting appropriate amt of protein daily.     Depression screening follow up plan of care: Follow up Dr. Russ Halo as needed. PHQ9 scored 1 today. No depression    High Risk Medications were discussed with the patient: Plavix. Discussed the risks associated with the medications - falls and bleeding. Discussed the benefits vs risks of each medication with the patient and patient chooses to continue medications as prescribed.    Orders Placed This Encounter   . MAMMO BILATERAL SCREENING-ADDL VIEWS/BREAST US AS REQ BY RAD   . MICROALBUMIN URINE, RANDOM   . diclofenac sodium (VOLTAREN) 1 % Gel   . DME - GLUCOMETER          Preventative health care was explained in detail. Age appropriate vaccinations and cancer screenings were described in detail. We discussed fall prevention and ways to ensure home safety. We discussed smoke detectors, carbon monoxide detectors, and the need for a fire extinguisher within the home. We discussed health maintenance screenings that are applicable at this time. We discussed medication compliance and have attempted to simplify medications as much as possible. Medical living will and power of attorney was discussed. Patient was encouraged to wear seatbelt and exhibit car safety. Driving safety was discussed. Exercise and healthy eating was discussed. Patient was told about Calcium and Vitamin D recommendations based on current age and gender.     The patient has been educated about risk factors and recommended preventive care. Written Prevention Plan completed/updated and given to patient (see After Visit Summary).    Next  office visit: Review and discuss labs. Re-evaluate chronic medical conditions.     Return in about 1 year (around 09/15/2018) for Medicare wellness 2019.    Jerrel Ivory, RN  CCM [facility] [place], [facility]  [address]  [place] [address]  [phone]    Chryl Heck, PA-C  09/15/2017, 15:00  I have personally interviewed and examined this patient.  I have reviewed the above document with Jerrel Ivory, RN, and I am agreement w/ contents and plan.

## 2017-09-16 ENCOUNTER — Ambulatory Visit (INDEPENDENT_AMBULATORY_CARE_PROVIDER_SITE_OTHER): Payer: Self-pay

## 2017-09-16 NOTE — Telephone Encounter (Signed)
Attempted to call with results.  No answer and no answering machine.  Janelle Floor, RN  09/16/2017, 16:00

## 2017-09-17 ENCOUNTER — Encounter (INDEPENDENT_AMBULATORY_CARE_PROVIDER_SITE_OTHER): Payer: Self-pay | Admitting: Cardiovascular Disease

## 2017-10-01 ENCOUNTER — Other Ambulatory Visit (HOSPITAL_BASED_OUTPATIENT_CLINIC_OR_DEPARTMENT_OTHER): Payer: Self-pay | Admitting: Family Medicine

## 2017-10-01 DIAGNOSIS — K219 Gastro-esophageal reflux disease without esophagitis: Secondary | ICD-10-CM

## 2017-10-01 MED ORDER — OMEPRAZOLE 40 MG CAPSULE,DELAYED RELEASE
DELAYED_RELEASE_CAPSULE | ORAL | 3 refills | Status: DC
Start: 2017-10-01 — End: 2018-03-21

## 2017-10-02 ENCOUNTER — Other Ambulatory Visit (HOSPITAL_BASED_OUTPATIENT_CLINIC_OR_DEPARTMENT_OTHER): Payer: Self-pay | Admitting: Family Medicine

## 2017-10-02 DIAGNOSIS — E119 Type 2 diabetes mellitus without complications: Secondary | ICD-10-CM

## 2017-10-02 DIAGNOSIS — Z0100 Encounter for examination of eyes and vision without abnormal findings: Principal | ICD-10-CM

## 2017-10-19 ENCOUNTER — Encounter (HOSPITAL_BASED_OUTPATIENT_CLINIC_OR_DEPARTMENT_OTHER): Payer: Self-pay | Admitting: PHYSICIAN ASSISTANT

## 2017-10-22 ENCOUNTER — Ambulatory Visit (HOSPITAL_BASED_OUTPATIENT_CLINIC_OR_DEPARTMENT_OTHER): Payer: Commercial Managed Care - PPO

## 2017-11-16 ENCOUNTER — Ambulatory Visit
Admission: RE | Admit: 2017-11-16 | Discharge: 2017-11-16 | Disposition: A | Discharge status: Home / Self Care | Payer: Commercial Managed Care - PPO | Source: Ambulatory Visit | Attending: Family Medicine | Admitting: Family Medicine

## 2017-11-16 ENCOUNTER — Encounter (HOSPITAL_BASED_OUTPATIENT_CLINIC_OR_DEPARTMENT_OTHER): Payer: Self-pay | Admitting: Family Medicine

## 2017-11-16 ENCOUNTER — Ambulatory Visit (INDEPENDENT_AMBULATORY_CARE_PROVIDER_SITE_OTHER): Payer: Commercial Managed Care - PPO | Admitting: Family Medicine

## 2017-11-16 VITALS — BP 116/78 | HR 84 | Resp 12 | Ht 66.0 in | Wt 202.0 lb

## 2017-11-16 DIAGNOSIS — Z6832 Body mass index (BMI) 32.0-32.9, adult: Secondary | ICD-10-CM

## 2017-11-16 DIAGNOSIS — E119 Type 2 diabetes mellitus without complications: Secondary | ICD-10-CM

## 2017-11-16 DIAGNOSIS — M79642 Pain in left hand: Secondary | ICD-10-CM

## 2017-11-16 DIAGNOSIS — K219 Gastro-esophageal reflux disease without esophagitis: Secondary | ICD-10-CM

## 2017-11-16 DIAGNOSIS — R634 Abnormal weight loss: Secondary | ICD-10-CM

## 2017-11-16 LAB — POCT HGB A1C: POCT HGB A1C: 8.2 % — AB (ref 4–6)

## 2017-11-16 MED ORDER — RANITIDINE 150 MG TABLET: 150 mg | Tab | Freq: Two times a day (BID) | ORAL | 1 refills | 0 days | Status: DC

## 2017-11-16 NOTE — Nursing Note (Signed)
11/16/17 1000   A1C   A1C 8.2

## 2017-11-16 NOTE — Patient Instructions (Signed)
A1C  Does this test have other names?  Hemoglobin A1c; HbA1c; glycosylated hemoglobin; glycohemoglobin; Glycated hemoglobin  What is this test?  A1C is a blood test that shows average blood sugar (glucose) levels over the last 3 months. The test is done to find out if a person has diabetes or prediabetes. It's also used to see how well a person with diabetes controls their blood sugar. The test can help guide diabetes treatment over time.  Why do I need this test?  You may need this test to check for prediabetes or diabetes.  If you have diabetes or prediabetes, you may need this test to see how well you control your blood sugar. People with diabetes need to track their blood sugar (glucose) levels every day to make sure they aren't too high or too low. The A1C test gives results for a longer period of time. It shows if your blood sugar has been too high on average over the last 3 months.  Glucose sticks to hemoglobin in the blood. Hemoglobin is a protein in red blood cells that carries oxygen. When blood sugar is high, more glucose builds up and sticks to the hemoglobin. The A1C test measures how much of the hemoglobin is coated with sugar.  You may have the test when a healthcare provider first works with you to treat your diabetes. You may then need to have the A1C test 2 or more times a year. This depends on the type of diabetes and how well it's controlled. The American Diabetes Association (ADA)advises an A1C test at least 2 times a year if you are meeting your blood sugar goals. If you aren't meeting your goals or your medicine has changed, you should have the A1C test more often.  What other tests might I have along with this test?  If your healthcare provider tests you for diabetes, you may also have any of these tests:   Fasting plasma glucose blood test (FPG)   Oral glucose tolerance test (OGTT)   Urine test to check for sugar, ketones, or protein  What do my test results mean?  Test results may  vary depending on your age, gender, health history, the method used for the test, and other things. Your test results may not mean you have a problem. Ask your healthcare provider what your test results mean for you.  A1C results are reported as a percentage. Here are what the results mean:   A1C below 5.7%. This is normal.   A1C from 5.7% to 6.4%. You may have prediabetes. This means you have a higher risk for diabetes in the future.   A1C of 6.5% or above on 2 separate tests. You may have diabetes.  The ADA says that people with diabetes should keep an A1C below 7%. The American Association of Clinical Endocrinologists advises an A1C of 6.5% or less. Your healthcare provider may give you other advice. This is based on your age, health conditions, and other things.  How is this test done?  The test is done with a blood sample. A needle is used to draw blood from a vein in your arm or hand.  Does this test pose any risks?  Having a blood test with a needle carries some risks. These include bleeding, infection, bruising, and feeling lightheaded. When the needle pricks your arm or hand, you may feel a slight sting or pain. Afterward, the site may be sore.  What might affect my test results?  Your blood sugar   levels change throughout the day. This won't affect the A1C test result.  If you have sickle cell anemia or other blood disorders, an A1C test may be less useful for diagnosing or watching diabetes. Your healthcare provider may tell you to use a different test that will work better for you.  The test results may be less accurate if you have any of the below:   Anemia   Heavy bleeding   Iron deficiency   Kidney failure   Liver disease  How do I get ready for this test?  You don't need to get ready for the test.   2000-2018 [employer]. [address], [place] [address]. All rights reserved. This information is not intended as a substitute for professional medical care. Always  follow your healthcare professional's instructions.        Deciding on Bariatric Surgery    Is excess weight affecting your life and your health? Bariatric surgery (also called obesity surgery) may help you reach a healthier weight. This surgery alters your digestive system. For the surgery to work, you must change your diet and lifestyle. In most cases, the surgery is not reversible. So if you're considering surgery, learn all you can about it before you decide. Bariatric surgery also has a number of potential risks and complications that you need to discuss with your surgeon.  Qualifying for surgery  Surgery is not for everyone. To qualify:   You must have a BMI of 40 or more, or a BMI of 35 or more (see box below) plus a serious obesity-related health problem, such as type 2 diabetes, high blood pressure, or sleep apnea.   You must be healthy enough to have surgery.   You may be required to have a psychological evaluation.   You must have tried to lose weight by other means, such as diet and exercise.  Setting realistic expectations  The goal of bariatric surgery is to help you lose over half of your excess weight. This can improve or prevent health problems. This surgery is not done for cosmetic reasons. Keep in mind that:   Other weight-loss methods should be tried first. Lifestyle changes, behavioral modifications, and prescription medicines are initial choices. Surgery is only a choice if other methods don't work.   Surgery is meant to be permanent. You will need to change how you eat for the rest of your life.   You must commit to eating less and being more active after surgery. If you don't, you will not lose or keep off the weight.   You won't reach a healthy weight right away. Most weight is lost steadily during the first year or two after surgery.   Most likely, you won't lose all your excess weight. But you can reach a much healthier weight, and maintain it by following your doctor's advice for  diet and exercise.  Obesity is measured by a formula called body mass index (BMI), which is based on your height and weight. A healthy BMI is about 18 to 25. A BMI of 30 or more signals obesity. A BMI of 35 or more is severe obesity. A BMI of 40 or more reflects morbid obesity.   Resources   [employer] for Metabolic and Bariatric Surgerywww.asmbs.org   National Heart, Lung, and Blood Institute Obesity Education Initiativewww.TreatmentWindow.hu  Date Last Reviewed: 03/08/2017   2000-2018 [employer]. [address], [place] [address]. All rights reserved. This information is not intended  as a substitute for professional medical care. Always follow your healthcare professional's instructions.

## 2017-11-16 NOTE — Progress Notes (Signed)
Patient Name: Monique [name]  Date: 11/16/2017  [facility]  [address]  [place] [address]  [id number]  MRN: [id number]  DOB: 1966/06/30    Chief complaint: Diabetes follow-up    HPI: The patient is a 51 y.o. old female who came in today for       Diabetes:  Symptoms include fatigue, while symptoms do not include polydipsia, polyuria, polyphagia, weight loss, blurred vision, irritability, somnolence, dizziness or sweating. Symptom onset was gradual. The symptoms occur constantly. The episodes occur daily. This is described as moderate in severity and unchanged. Associated symptoms do not include excessive hunger, nausea, vomiting, anorexia, abdominal pain, fruity breath, amenorrhea or seizures;   She continues to use meds as ordered.  No side effects.    Weight Loss:  No changes in management were made at the last visit. Patient reports weight loss of between 40 and 49 pounds. Symptoms include anorexia and decreased food intake, while symptoms do not include weight loss, dysphagia, odynophagia, dysgeusia, diarrhea, nausea, vomiting or polyuria. Onset was gradual months ago. The patient describes this as unchanged. The patient is post gastric surgery.; and     Epigastric Pain:  Symptoms include heartburn and regurgitation, while symptoms do not include pain, acid taste in the mouth, sore throat, dysphagia or odynophagia. Symptoms are located in the epigastrium. There is no radiation. The patient describes the pain as burning. Onset was gradual. There is no known event that preceded symptom onset. The symptoms occur constantly. The episodes occur daily and last for hours. The patient describes this as moderate in severity and worsening.  She feels this pain is not pathological she feels it is a continuation of her GERD and just not responsive to her omeprazole.  She is working on behaviors including avoidance of overeating.  She has for supplemental  support.      Medications:  Current Outpatient Medications   Medication Sig    ACCU-CHEK AVIVA PLUS TEST STRP Strip 1 Strip by Subcutaneous route Three times a day as needed    BYDUREON 2 mg Subcutaneous     clopidogrel (PLAVIX) 75 mg Oral Tablet Take 1 Tab (75 mg total) by mouth Once a day    diclofenac sodium (VOLTAREN) 1 % Gel 2 g by Apply Topically route Four times a day - before meals and bedtime    furosemide (LASIX) 40 mg Oral Tablet Take 1 Tab (40 mg total) by mouth Once a day    HYDROcodone-acetaminophen (NORCO) 10-325 mg Oral Tablet Take 1 Tab by mouth Every 4 hours as needed    Ibuprofen (MOTRIN) 800 mg Oral Tablet Take 800 mg by mouth Three times a day as needed for Pain    lisinopril (PRINIVIL) 2.5 mg Oral Tablet take 1 tablet by mouth once daily    metoprolol succinate (TOPROL-XL) 25 mg Oral Tablet Sustained Release 24 hr Take 0.5 Tabs (12.5 mg total) by mouth Once a day    niacin (NIASPAN) 500 mg Oral Tablet Sustained Release Take 1 Tab (500 mg total) by mouth Once a day    nitroGLYCERIN (NITROSTAT) 0.4 mg Sublingual Tablet, Sublingual 1 Tab (0.4 mg total) by Sublingual route Every 5 minutes as needed for Chest pain for 3 doses over 15 minutes    omeprazole (PRILOSEC) 40 mg Oral Capsule, Delayed Release(E.C.) take 1 capsule by mouth once daily    ondansetron (ZOFRAN ODT) 8 mg Oral Tablet, Rapid Dissolve Take 1 Tab (8 mg total) by  mouth Every 8 hours as needed for nausea/vomiting    PRENATAL PLUS, CALCIUM CARB, 27 mg iron- 1 mg Oral Tablet Take 1 Tab by mouth Once a day    raNITIdine (ZANTAC) 150 mg Oral Tablet Take 1 Tab (150 mg total) by mouth Twice daily    rosuvastatin (CRESTOR) 20 mg Oral Tablet Take 1 Tab (20 mg total) by mouth Once a day    VENTOLIN HFA 90 mcg/actuation Inhalation HFA Aerosol Inhaler Take 1-2 Puffs by inhalation Every 6 hours as needed (wheezing, cough)     Allergies:  Allergies   Allergen Reactions    No Known Drug Allergies      Past medical history:  Past  Medical History:   Diagnosis Date    Anxiety     BMI 35.0-35.9,adult     Carotid stenosis     Chronic back pain     Coronary artery disease     DDD (degenerative disc disease), lumbar     "entire spine"    Ear piercing     GERD (gastroesophageal reflux disease)     H/O complete eye exam     2 years, Dr. [name], at [facility]    History of dental examination     dentures upper and lower    HTN     Hyperlipidemia     Neck problem     herniated cervical disc    Obesity     Stroke (CMS [facility])     2002    Tattoo     Left breast, Right shoulder    Type 2 diabetes mellitus     Dx 1999 average fasting 70-89     Surgical history:  Past Surgical History:   Procedure Laterality Date    BYPASS GRAFT  2012    cardiac, 2 vessel, Dr. [name], [place]    HX ANKLE FRACTURE TX      Right, Doran Durand procedure    HX CATARACT REMOVAL      r and l eyes with implants    HX CESAREAN SECTION  06/20/2000    x3, 11/21/86, 11/10/84    HX CHOLECYSTECTOMY  1988    HX CORONARY STENT PLACEMENT  2012    x4 stents    HX GASTRIC SLEEVE  02/2017    [place], [place], Dr. [name] TONSILLECTOMY      as child     Family history:  Family Medical History:     Problem Relation (Age of Onset)    Atrial fibrillation Sister    Colon Cancer Father    Diabetes Mother    Heart Attack Mother    Heart Disease Sister, Maternal Grandfather    Hypertension Mother    Lung Cancer Maternal Grandfather        Social history:  Social History     Socioeconomic History    Marital status: Married     Spouse name: [name]    Number of children: 3    Years of education: completed 11th grade    Highest education level: Not on file   Social Needs    Financial resource strain: Not on file    Food insecurity - worry: Not on file    Food insecurity - inability: Not on file    Transportation needs - medical: Not on file    Transportation needs - non-medical: Not on file   Occupational History    Occupation: disabled  Tobacco Use    Smoking status: Current Every Day Smoker     Packs/day: 0.25     Years: 20.00     Pack years: 5.00     Types: Cigarettes     Last attempt to quit: 03/30/2011     Years since quitting: 6.6    Smokeless tobacco: Never Used   Substance and Sexual Activity    Alcohol use: No    Drug use: No    Sexual activity: Yes     Partners: Male   Other Topics Concern    Abuse/Domestic Violence Not Asked    Breast Self Exam Not Asked    Caffeine Concern Not Asked    Calcium intake adequate Not Asked    Computer Use Not Asked    Drives Not Asked    Exercise Concern Not Asked    Helmet Use Not Asked    Seat Belt Not Asked    Special Diet Not Asked    Sunscreen used Not Asked    Uses Cane Not Asked    Uses walker Not Asked    Uses wheelchair Not Asked    Right hand dominant Not Asked    Left hand dominant Not Asked    Ambidextrous Not Asked    Shift Work Not Asked    Unusual Sleep-Wake Schedule Not Asked    Ability to Walk 1 Flight of Steps without SOB/CP Not Asked    Routine Exercise No    Ability to Walk 2 Flight of Steps without SOB/CP Yes    Unable to Ambulate Not Asked    Total Care Not Asked    Ability To Do Own ADL's Not Asked    Uses Walker Not Asked    Other Activity Level Not Asked    Uses Cane Not Asked   Social History Narrative    Living situation: lives at home with husband, [name], and daughter.     Nutrition:  Eats small meals d/t sleeve surgery.     Caffeine use: none, decaf coffee    Exercise: stays busy around house.     Seatbelt use: yes    Fire extinguishers in the home: yes    Smoke alarms in the home: yes    Carbon monoxide detectors in the home: yes    Nancy Fetter exposure:        Gabriellia would like for husband, Aiesha Leland,  to speak for her in the event she would become incapacitated.    Full code.      Vitals:    Vitals:    11/16/17 1018   BP: 116/78   Pulse: 84   Resp: 12   SpO2: 96%   Weight: 91.6 kg (202 lb)   Height: 1.676 m (5\' 6" )   BMI: 32.67     Physical  Examination:    GENERAL:   Pt is a pleasant, well-nourished, well-developed 51 y.o. female who is in NAD. Appears stated age  32:  head normocephalic, symmetrical facies. EOM intact b/l. PERRLA. Sclera non-icteric, non-injected. No rhinorrhea. Oropharyngeal mucous membranes are moist  w/o erythema/exudates, upper eyelid w/Xythoma-lipid plaques. Stable.   NECK: supple. No masses, lymphadenopathy, JVD, or carotid bruits on exam. Trachea midline, no thyromegaly   CV:  S1-S2 No murmurs, rubs, or  gallops.   LUNGS: CTAB, No rhonchi, rales, wheezes.   GI: (+) BS in all 4 quadrants. soft, NT/ND.   No rigidity/ guarding/ rebound. No organomegaly/masses, or abdominal bruits  MSK: Spontaneous normal AROM of  major joints as observed during exam.   No joint effusions/ swelling/ deformities.     but for left hand 3rd digit was slight enlargement at the mid joint.  She relates this to a previous trauma   initial x-ray was negative for fracture.    It's tenderness in her fear provoke the need for an  repeatx-ray  No BLE edema, clubbing, or cyanosis.   2+ radial pulse present b/l.   + 2 reflexes Brachioradialis, patellar bilaterally.   Normal gait  SKIN: No significant lesions, rashes, ecchymoses noted.   Note: midline chest scar consistent w/ CABG.  NEURO: AAOx4, CN grossly intact. Sensation intact b/l. No focal deficits.  PSYCH: Mood, behavior and affect normal w/ Intact judgement & insight     LABS:     CBC  Diff   Lab Results   Component Value Date/Time    WBC 11.1 (H) 10/23/2016 12:16 PM    WBC 14.0 (H) 04/16/2011 08:22 AM    HGB 14.8 10/23/2016 12:16 PM    HGB 10.3 (L) 04/16/2011 08:22 AM    HCT 45.0 10/23/2016 12:16 PM    HCT 32.3 (L) 04/16/2011 08:22 AM    PLTCNT 382 10/23/2016 12:16 PM    PLTCNT 1197 (H) 04/16/2011 08:22 AM    RBC 5.04 10/23/2016 12:16 PM    RBC 3.55 (L) 04/16/2011 08:22 AM    MCV 89.3 10/23/2016 12:16 PM    MCV 90.9 04/16/2011 08:22 AM    MCHC 32.9 (L) 10/23/2016 12:16 PM    MCHC 31.9 04/16/2011 08:22  AM    MCH 29.4 10/23/2016 12:16 PM    MCH 29.0 04/16/2011 08:22 AM    RDW 14.4 10/23/2016 12:16 PM    RDW 14.5 (H) 04/16/2011 08:22 AM    MPV 5.6 (L) 04/16/2011 08:22 AM    Lab Results   Component Value Date/Time    PMNS 84 (H) 04/01/2011 05:08 AM    LYMPHOCYTES 8 (L) 04/01/2011 05:08 AM    EOSINOPHIL 0 (L) 04/01/2011 05:08 AM    MONOCYTES 8 04/01/2011 05:08 AM    BASOPHILS 0 04/01/2011 05:08 AM    PMNABS 13.200 (H) 04/01/2011 05:08 AM    LYMPHSABS 1.210 04/01/2011 05:08 AM    EOSABS 0.018 (L) 04/01/2011 05:08 AM    MONOSABS 1.210 (H) 04/01/2011 05:08 AM    BASOSABS 0.034 04/01/2011 05:08 AM          CBC  Diff   Lab Results   Component Value Date/Time    WBC 11.1 (H) 10/23/2016 12:16 PM    WBC 14.0 (H) 04/16/2011 08:22 AM    HGB 14.8 10/23/2016 12:16 PM    HGB 10.3 (L) 04/16/2011 08:22 AM    HCT 45.0 10/23/2016 12:16 PM    HCT 32.3 (L) 04/16/2011 08:22 AM    PLTCNT 382 10/23/2016 12:16 PM    PLTCNT 1197 (H) 04/16/2011 08:22 AM    RBC 5.04 10/23/2016 12:16 PM    RBC 3.55 (L) 04/16/2011 08:22 AM    MCV 89.3 10/23/2016 12:16 PM    MCV 90.9 04/16/2011 08:22 AM    MCHC 32.9 (L) 10/23/2016 12:16 PM    MCHC 31.9 04/16/2011 08:22 AM    MCH 29.4 10/23/2016 12:16 PM    MCH 29.0 04/16/2011 08:22 AM    RDW 14.4 10/23/2016 12:16 PM    RDW 14.5 (H) 04/16/2011 08:22 AM    MPV 5.6 (L) 04/16/2011 08:22 AM    Lab Results   Component Value Date/Time  PMNS 84 (H) 04/01/2011 05:08 AM    LYMPHOCYTES 8 (L) 04/01/2011 05:08 AM    EOSINOPHIL 0 (L) 04/01/2011 05:08 AM    MONOCYTES 8 04/01/2011 05:08 AM    BASOPHILS 0 04/01/2011 05:08 AM    PMNABS 13.200 (H) 04/01/2011 05:08 AM    LYMPHSABS 1.210 04/01/2011 05:08 AM    EOSABS 0.018 (L) 04/01/2011 05:08 AM    MONOSABS 1.210 (H) 04/01/2011 05:08 AM    BASOSABS 0.034 04/01/2011 05:08 AM        Comprehensive Metabolic Profile    Lab Results   Component Value Date/Time    SODIUM 139 03/18/2017 09:01 AM    SODIUM 137 04/16/2011 08:22 AM    POTASSIUM 4.2 03/18/2017 09:01 AM    POTASSIUM 4.1  04/16/2011 08:22 AM    CHLORIDE 93 (L) 03/18/2017 09:01 AM    CHLORIDE 95 (L) 04/16/2011 08:22 AM    CO2 32 (H) 03/18/2017 09:01 AM    CO2 32 04/16/2011 08:22 AM    ANIONGAP 14 03/18/2017 09:01 AM    ANIONGAP 10 04/16/2011 08:22 AM    BUN 27 (H) 03/18/2017 09:01 AM    BUN 13 04/16/2011 08:22 AM    CREATININE 1.20 03/18/2017 09:01 AM    CREATININE 1.08 04/16/2011 08:22 AM    GLUCOSE 149 (H) 03/31/2011 05:00 PM    GLUCOSE NEGATIVE 03/24/2011 06:05 PM    Lab Results   Component Value Date/Time    CALCIUM 11.3 (H) 03/18/2017 09:01 AM    CALCIUM 8.7 04/16/2011 08:22 AM    ALBUMIN 3.9 10/23/2016 12:16 PM    TOTALPROTEIN 6.8 10/23/2016 12:16 PM    ALKPHOS 109 10/23/2016 12:16 PM    AST 22 10/23/2016 12:16 PM    ALT 29 10/23/2016 12:16 PM    TOTBILIRUBIN 0.4 10/23/2016 12:16 PM         11/16/17 1000   A1C   A1C 8.2       Visit Diagnosis    Encounter Diagnoses   Name Primary?    Type 2 diabetes mellitus without complication, without long-term current use of insulin (CMS HCC) Yes    Weight loss     Gastroesophageal reflux disease, esophagitis presence not specified     Hand pain, left      Orders Placed This Encounter    XR HAND LEFT    POCT HGB A1C    raNITIdine (ZANTAC) 150 mg Oral Tablet     Morbid obesity due to excess calories (278.01   E66.01)  Impression: Chronic  She continues to loose weight-  Status post procedure with Dr. Bridgett Larsson  She will follow through on his recommendations.  Keep follow-up appointment.  Follow-up Labs will be ordered by the surgeon.  She has had  less dumping.  We discussed this she will alert [place] if it becomes more frequent..   however increased reflux , I will add rimantadine for the next month or so.    DM:   Chronic, improving.  Continue behavioral efforts including a diabetic friendly diet.  Her recent surgery is been extremely helpful.   Last A1C 9.2, now down 1 point see above.  Continue use of   Byetta.   She could tolerate this as it's injection.     Will stop medication if she  reaches an A1c of 6.5 or lower.    Edema   improved today.  She has follow-up with Dr. Higinio Roger, next week  On defer the decision to order an echocardiogram to  him.  She continues to use Lasix on an intermittent basis.      Left hand  Pain   x-ray ordered as above   discussed price.    Limit nonsteroidals for previous gastric surgery.    Return in about 3 months (around 02/14/2018), or if symptoms worsen or fail to improve, for   Diabetes mellitus, GERD.Cherly Beach, MD  11/16/2017, 10:44    This note was partially generated using MModal Fluency Direct system, and there may be some incorrect words, spellings, and punctuation that were not noted in checking the note before saving.

## 2017-11-17 ENCOUNTER — Other Ambulatory Visit (HOSPITAL_BASED_OUTPATIENT_CLINIC_OR_DEPARTMENT_OTHER): Payer: Self-pay | Admitting: Family Medicine

## 2017-11-17 DIAGNOSIS — R110 Nausea: Secondary | ICD-10-CM

## 2017-11-17 MED ORDER — ONDANSETRON 8 MG DISINTEGRATING TABLET: 8 mg | Tab | Freq: Three times a day (TID) | ORAL | 1 refills | 0 days | Status: DC | PRN

## 2017-11-17 NOTE — Telephone Encounter (Signed)
patients husband called very upset saying a Rx for Nausea was supposed to be called in yesterday when patient was seen.

## 2017-11-24 ENCOUNTER — Other Ambulatory Visit (HOSPITAL_BASED_OUTPATIENT_CLINIC_OR_DEPARTMENT_OTHER): Payer: Self-pay | Admitting: Family Medicine

## 2017-11-24 ENCOUNTER — Encounter (HOSPITAL_BASED_OUTPATIENT_CLINIC_OR_DEPARTMENT_OTHER): Payer: Self-pay | Admitting: Family Medicine

## 2017-11-24 ENCOUNTER — Ambulatory Visit (INDEPENDENT_AMBULATORY_CARE_PROVIDER_SITE_OTHER): Payer: Commercial Managed Care - PPO | Admitting: Family Medicine

## 2017-11-24 VITALS — BP 110/60 | HR 79 | Temp 96.7°F | Ht 66.0 in | Wt 203.0 lb

## 2017-11-24 DIAGNOSIS — J209 Acute bronchitis, unspecified: Secondary | ICD-10-CM

## 2017-11-24 DIAGNOSIS — Z6832 Body mass index (BMI) 32.0-32.9, adult: Secondary | ICD-10-CM

## 2017-11-24 DIAGNOSIS — J029 Acute pharyngitis, unspecified: Principal | ICD-10-CM

## 2017-11-24 MED ORDER — VENTOLIN HFA 90 MCG/ACTUATION AEROSOL INHALER: 1 | Inhaler | Freq: Four times a day (QID) | RESPIRATORY_TRACT | 1 refills | 0 days | Status: DC | PRN

## 2017-11-24 MED ORDER — AMOXICILLIN 875 MG TABLET: 875 mg | Tab | Freq: Two times a day (BID) | ORAL | 0 refills | 0 days | Status: AC

## 2017-11-24 MED ORDER — METHYLPREDNISOLONE 4 MG TABLETS IN A DOSE PACK: Tab | ORAL | 0 refills | 0 days | Status: DC

## 2017-11-24 MED ORDER — DEXTROMETHORPHAN-GUAIFENESIN 15 MG-100 MG/5 ML ORAL SYRUP: 10 mL | mL | Freq: Four times a day (QID) | ORAL | 0 refills | 0 days | Status: DC | PRN

## 2017-11-24 NOTE — Patient Instructions (Addendum)
** Save these directions for Medrol Dose Pak, they are different than what is on the box.   Alternate instructions so you will only have to take it once or twice daily:  Medrol Dose Pak - Take with food.  Day  Breakfast Dose  Dinner (evening meal) Dose  1  3 pills    3 pills  2  2 pills    3 pills     3  2 pills    2 pills  4  3 pills    None  5  2 pills    None  6  1 pill  * If you receive this prescription later in the day, it is OK to take 3 pills with dinner, then 3 pills with bedtime snack to get Day 1 dosage started.  Other prescriptions as directed.  Check your blood sugars daily and if they start to rise 50-75 points higher than what you normally run, stop the Medrol Dosepak and contact your PCP.  Recheck here as needed.      Viral or Bacterial Bronchitis with Wheezing(Adult)    Bronchitis is an infection of the air passages. It often occurs during a cold and is usually caused by a virus. Symptoms include cough with mucus (phlegm) and low-grade fever. This illness is contagious during the first few days and is spread through the air by coughing and sneezing, or by direct contact (touching the sick person and then touching your own eyes, nose, or mouth).  If there is a lot of inflammation, air flow is restricted. The air passages may also go into spasm, especially if you have asthma. This causes wheezing and difficulty breathing even in people who do not have asthma.  Bronchitis usually lasts 7 to 14 days. The wheezing should improve with treatment during the first week. An inhaler is often prescribed to relax the air passages and stop wheezing. Antibiotics will be prescribed if your doctor thinks there is also a secondary bacterial infection.  Home care   If symptoms are severe, rest at home for the first 2 to 3 days. When you go back to your usual activities, don't let yourself get too tired.   Dont s'moke. Also avoid being exposed to secondhand smoke.   You may use over-the-counter medicine to control  fever or pain, unless another medicine was prescribed. Note: If you have chronic liver or kidney disease or have ever had a stomach ulcer or gastrointestinal bleeding, talk with your healthcare provider before using these medicines. Also talk to your provider if you are taking medicine to prevent blood clots.) Aspirin should never be given to anyone younger than 53 years of age who is ill with a viral infection or fever. It may cause severe liver or brain damage.   Your appetite may be poor, so a light diet is fine. Stay well hydrated by drinking 6 to 8 glasses of fluids per day (such as water, soft drinks, sports drinks, juices, tea, or soup). Extra fluids will help loosen secretions in the nose and lungs.   Over-the-counter cough, cold, and sore-throat medicines will not shorten the length of the illness, but they may be helpful to reduce symptoms. (Note: Don't use decongestants if you have high blood pressure.)   If you were given an inhaler, use it exactly as directed. If you need to use it more often than prescribed, your condition may be worsening. If this happens, contact your healthcare provider.   If prescribed, finish all antibiotic  medicine, even if you are feeling better after only a few days.  Follow-up care  Follow up with your healthcare provider, or as advised. If you had an X-ray or ECG (electrocardiogram), a specialist will review it. You will be notified of any new findings that may affect your care.  If you are age 6 or older, or if you have a chronic lung disease or condition that affects your immune system, or you smoke, ask your healthcare provider about getting a pneumococcal vaccine and a yearly flu shot (influenza vaccine).  When to seek medical advice  Call your healthcare provider right away if any of these occur:   Fever of 100.1F (38C) or higher, or as directed by your healthcare provider   Coughing up increasing amounts of colored sputum   Weakness, drowsiness, headache,  facial pain, ear pain, or a stiff neck  Call 911  Call 911 if any of these occur.   Coughing up blood   Worsening weakness, drowsiness, headache, or stiff neck   Increased wheezing not helped with medication, shortness of breath, or pain with breathing   Date Last Reviewed: 05/08/2017   2000-2018 [employer]. [address], [place] [address]. All rights reserved. This information is not intended as a substitute for professional medical care. Always follow your healthcare professional's instructions.

## 2017-11-24 NOTE — Progress Notes (Signed)
[facility]  Patient Name: Monique [name]  Date: 11/24/2017  [place], [place]  [address]  [place] [address]  [phone]  MRN: [id number]  DOB: 26-Oct-1966    Chief complaint: Ear Pain; Cough; and Chest Congestion.    HPI:  The patient is a 51 y.o. old female who presents with the following symptoms:   Ears itching parentheses no earache and parentheses, slightly productive cough, feelings of occasional chest congestion, and sore throat.  Denies any fever or any GI symptoms.   She states that her blood sugars have been doing very well lately, without elevation.    ROS:    Constitutional: No unintentional weight gain/loss, fever, chills, fatigue, tiredness  Skin: No skin problems or rashes, itching  HEENT:   See HPI.   Cardiovascular: No heart valve problems/murmurs, irregular heartbeat, angina/chest pain, swelling in feet, swelling in hands, shortness of breath lying flat, short of breath climbing stairs, palpitations  Respiratory:  See HPI.   Gastrointestinal: No abdominal pain, diarrhea, vomiting, constipation, vomiting blood, black or tarry stools, nausea, GERD  Genitourinary: No hematuria, change in urinary stream, urinary frequency, incontinence, dysuria, nocturia, sexual dysfunction  Musculoskeletal: No muscle weakness, atrophy, joint pain/swelling. No neck or back pain.  Neurologic: No headache, seizures, stroke, dizziness, loss of consciousness, paresthesias, numbness  Psychiatric: No anxiety, depression, memory loss, suicidal/homicidal thoughts  Endocrine: No polydipsia, polyuria, thyroid problems    Past medical history:  Past Medical History:   Diagnosis Date   . Anxiety    . BMI 35.0-35.9,adult    . Carotid stenosis    . Chronic back pain    . Coronary artery disease    . DDD (degenerative disc disease), lumbar     "entire spine"   . Ear piercing    . GERD (gastroesophageal reflux disease)    . H/O complete eye exam     2 years, Dr.  [name], at [facility]   . History of dental examination     dentures upper and lower   . HTN    . Hyperlipidemia    . Neck problem     herniated cervical disc   . Obesity    . Stroke ([facility])     2002   . Tattoo     Left breast, Right shoulder   . Type 2 diabetes mellitus     Dx 1999 average fasting 70-89         Past surgical history:  Past Surgical History:   Procedure Laterality Date   . BYPASS GRAFT  2012    cardiac, 2 vessel, Dr. [name], [facility]   . ENDOMETRIAL ABLATION  1998   . HX ANKLE FRACTURE TX      Right, Doran Durand procedure   . HX CATARACT REMOVAL      r and l eyes with implants   . HX CESAREAN SECTION  06/20/2000    x3, 11/21/86, 11/10/84   . HX CHOLECYSTECTOMY  1988   . HX CORONARY STENT PLACEMENT  2012    x4 stents   . HX GASTRIC SLEEVE  02/2017    [place], [place], Dr. Pearlie Oyster   . HX TONSILLECTOMY      as child         Medications:  Current Outpatient Medications   Medication Sig   . ACCU-CHEK AVIVA PLUS TEST STRP Strip 1 Strip by Subcutaneous route Three times a day as needed   .  amoxicillin (AMOXIL) 875 mg Oral Tablet Take 1 Tab (875 mg total) by mouth Twice daily for 10 days   . BYDUREON 2 mg Subcutaneous    . clopidogrel (PLAVIX) 75 mg Oral Tablet Take 1 Tab (75 mg total) by mouth Once a day   . Dextromethorphan-Guaifenesin 15-100 mg/5 mL Oral Syrup Take 10 mL by mouth Every 6 hours as needed for Cough   . diclofenac sodium (VOLTAREN) 1 % Gel 2 g by Apply Topically route Four times a day - before meals and bedtime   . furosemide (LASIX) 40 mg Oral Tablet Take 1 Tab (40 mg total) by mouth Once a day   . HYDROcodone-acetaminophen (NORCO) 10-325 mg Oral Tablet Take 1 Tab by mouth Every 4 hours as needed   . Ibuprofen (MOTRIN) 800 mg Oral Tablet Take 800 mg by mouth Three times a day as needed for Pain   . lisinopril (PRINIVIL) 2.5 mg Oral Tablet take 1 tablet by mouth once daily   . Methylprednisolone (MEDROL DOSEPACK) 4 mg Oral Tablets, Dose Pack Take as instructed.   . metoprolol succinate  (TOPROL-XL) 25 mg Oral Tablet Sustained Release 24 hr Take 0.5 Tabs (12.5 mg total) by mouth Once a day   . niacin (NIASPAN) 500 mg Oral Tablet Sustained Release Take 1 Tab (500 mg total) by mouth Once a day   . nitroGLYCERIN (NITROSTAT) 0.4 mg Sublingual Tablet, Sublingual 1 Tab (0.4 mg total) by Sublingual route Every 5 minutes as needed for Chest pain for 3 doses over 15 minutes   . omeprazole (PRILOSEC) 40 mg Oral Capsule, Delayed Release(E.C.) take 1 capsule by mouth once daily   . ondansetron (ZOFRAN ODT) 8 mg Oral Tablet, Rapid Dissolve Take 1 Tab (8 mg total) by mouth Every 8 hours as needed for nausea/vomiting   . PRENATAL PLUS, CALCIUM CARB, 27 mg iron- 1 mg Oral Tablet Take 1 Tab by mouth Once a day   . raNITIdine (ZANTAC) 150 mg Oral Tablet Take 1 Tab (150 mg total) by mouth Twice daily   . rosuvastatin (CRESTOR) 20 mg Oral Tablet Take 1 Tab (20 mg total) by mouth Once a day   . VENTOLIN HFA 90 mcg/actuation Inhalation HFA Aerosol Inhaler Take 1-2 Puffs by inhalation Every 6 hours as needed (wheezing, cough)     Allergies:  Allergies   Allergen Reactions   . No Known Drug Allergies      Family history:  Family Medical History:     Problem Relation (Age of Onset)    Atrial fibrillation Sister    Colon Cancer Father    Diabetes Mother    Heart Attack Mother    Heart Disease Sister, Maternal Grandfather    Hypertension Mother    Lung Cancer Maternal Grandfather            Social history:  Social History     Socioeconomic History   . Marital status: Married     Spouse name: [name]   . Number of children: 3   . Years of education: completed 11th grade   . Highest education level: Not on file   Social Needs   . Financial resource strain: Not on file   . Food insecurity - worry: Not on file   . Food insecurity - inability: Not on file   . Transportation needs - medical: Not on file   . Transportation needs - non-medical: Not on file   Occupational History   . Occupation: disabled   Tobacco Use   .  Smoking  status: Current Every Day Smoker     Packs/day: 0.25     Years: 20.00     Pack years: 5.00     Types: Cigarettes     Last attempt to quit: 03/30/2011     Years since quitting: 6.6   . Smokeless tobacco: Never Used   Substance and Sexual Activity   . Alcohol use: No   . Drug use: No   . Sexual activity: Yes     Partners: Male   Other Topics Concern   . Abuse/Domestic Violence Not Asked   . Breast Self Exam Not Asked   . Caffeine Concern Not Asked   . Calcium intake adequate Not Asked   . Computer Use Not Asked   . Drives Not Asked   . Exercise Concern Not Asked   . Helmet Use Not Asked   . Seat Belt Not Asked   . Special Diet Not Asked   . Sunscreen used Not Asked   . Uses Cane Not Asked   . Uses walker Not Asked   . Uses wheelchair Not Asked   . Right hand dominant Not Asked   . Left hand dominant Not Asked   . Ambidextrous Not Asked   . Shift Work Not Asked   . Unusual Sleep-Wake Schedule Not Asked   . Ability to Walk 1 Flight of Steps without SOB/CP Not Asked   . Routine Exercise No   . Ability to Walk 2 Flight of Steps without SOB/CP Yes   . Unable to Ambulate Not Asked   . Total Care Not Asked   . Ability To Do Own ADL's Not Asked   . Uses Walker Not Asked   . Other Activity Level Not Asked   . Uses Cane Not Asked   Social History Narrative    Living situation: lives at home with husband, [name], and daughter.     Nutrition:  Eats small meals d/t sleeve surgery.     Caffeine use: none, decaf coffee    Exercise: stays busy around house.     Seatbelt use: yes    Fire extinguishers in the home: yes    Smoke alarms in the home: yes    Carbon monoxide detectors in the home: yes    Nancy Fetter exposure:        Lorry would like for husband, Aeon Koors,  to speak for her in the event she would become incapacitated.    Full code.        Vitals:    11/24/17 1553   BP: 110/60   Pulse: 79   Temp: 35.9 C (96.7 F)   SpO2: 95%   Weight: 92.1 kg (203 lb)   Height: 1.676 m (5\' 6" )   BMI: 32.83         Body mass index is 32.77 kg/m.     Physical Examination:    General: mildly obese, no distress and vital signs reviewed  Eyes: Conjunctiva clear.  HENT:TMs are clear, EACs are normal.  Nasopharynx is significant for throat redness without exudate.  No airway swelling.  Neck: no thyromegaly or lymphadenopathy  Lungs: Clear to auscultation bilaterally.   Cardiovascular: regular rate and rhythm, S1, S2 normal, no murmur, click, rub or gallop  Skin: Skin warm and dry    Rapid strep done was negative.  Patient notified.    Visit Diagnosis  Pharyngitis    Acute bronchitis, unspecified organism    Plan  Orders Placed This Encounter   .  POCT RAPID STREP A   . VENTOLIN HFA 90 mcg/actuation Inhalation HFA Aerosol Inhaler   . amoxicillin (AMOXIL) 875 mg Oral Tablet   . Methylprednisolone (MEDROL DOSEPACK) 4 mg Oral Tablets, Dose Pack   . Dextromethorphan-Guaifenesin 15-100 mg/5 mL Oral Syrup     Follow up with PCP or recheck here prn      Ambrose Mantle, MD  11/24/2017, 16:35  Electronically signed by Ambrose Mantle, MD    This note was partially generated using MModal Fluency Direct system, and there may be some incorrect words, spellings, and punctuation that were not noted in checking the note before saving.

## 2017-12-15 ENCOUNTER — Ambulatory Visit (INDEPENDENT_AMBULATORY_CARE_PROVIDER_SITE_OTHER): Payer: Commercial Managed Care - PPO

## 2017-12-15 VITALS — Wt 208.0 lb

## 2017-12-15 DIAGNOSIS — Z761 Encounter for health supervision and care of foundling: Secondary | ICD-10-CM

## 2017-12-15 DIAGNOSIS — R634 Abnormal weight loss: Secondary | ICD-10-CM

## 2017-12-16 ENCOUNTER — Encounter: Payer: Self-pay | Admitting: Internal Medicine

## 2017-12-17 ENCOUNTER — Ambulatory Visit (HOSPITAL_BASED_OUTPATIENT_CLINIC_OR_DEPARTMENT_OTHER): Payer: Commercial Managed Care - PPO

## 2017-12-28 ENCOUNTER — Encounter: Payer: Self-pay | Admitting: Gastroenterology

## 2017-12-28 ENCOUNTER — Ambulatory Visit: Payer: BLUE CROSS/BLUE SHIELD | Admitting: Gastroenterology

## 2017-12-28 ENCOUNTER — Telehealth: Payer: Self-pay

## 2017-12-28 VITALS — BP 133/83 | HR 67 | Temp 97.6°F | Ht 66.0 in | Wt 148.0 lb

## 2017-12-28 DIAGNOSIS — R109 Unspecified abdominal pain: Secondary | ICD-10-CM | POA: Diagnosis not present

## 2017-12-28 DIAGNOSIS — K219 Gastro-esophageal reflux disease without esophagitis: Secondary | ICD-10-CM

## 2017-12-28 DIAGNOSIS — K529 Noninfective gastroenteritis and colitis, unspecified: Secondary | ICD-10-CM | POA: Insufficient documentation

## 2017-12-28 DIAGNOSIS — R1030 Lower abdominal pain, unspecified: Secondary | ICD-10-CM

## 2017-12-28 DIAGNOSIS — G8929 Other chronic pain: Secondary | ICD-10-CM

## 2017-12-28 NOTE — Progress Notes (Signed)
Primary Care Physician:  Caryl Bis, MD Primary Gastroenterologist:  Dr. Gala Romney   Chief Complaint  Patient presents with  . Colonoscopy  . Gastroesophageal Reflux    bloating/gas  . Abdominal Pain    all over, mostly across bottom  . Blood In Stools    1 month ago    HPI:   Ann [name] is a 52 y.o. female presenting today at the request of Dr. Gar Ponto for a colonoscopy. Her last colonoscopy was in 2011, which we have requested from [place].   Notes lower abdominal pain, feels like rocks in her lower abdomen. Has history of incontinence. Can't eat anything outside of the house as she has urgent stools and goes straight through her. If eating a salad, will be in the bathroom within 10 minutes. Has to carry a bag with her due to accidents. Pours out like water. Never has a formed stool. Avoids eating because she knows she will have the loose stool. Will sometimes have 5+ loose stools a day. Symptoms present for years but worse with pain in lower abdomen. Has noted intermittent low-volume rectal bleeding.   She has a complicated past medical history, starting in 2010 after cholecystectomy. She had a bile leak s/p cholecystectomy, requiring ERCP with stent placement for biliary stricture. She reports multiple ERCPs at outside facility with multiple stents placed. She notes that each time stent would be removed, she would develop abdominal pain. She reports having "8-10 ERCPs". Metal stent has been in place since 2011/2012.  She was seen at [facility] in Jan 2013 in consultation, undergoing EUS/EGD that was normal. Recommended stent removal at that time but she declined, worried that pain wold recur.   March 2013 EUS/EGD at [facility]: normal EUS of pancreas, normal endoscopy. Metal stent in bile duct.  Bile leak in 2010 s/p cholecystectomy, multiple stents placed. Every time would take out, would have abdominal pain. States she had about 8-10 ERCPs. Had metal  stent placed in bile duct since 2011/2012. Review of Care Everywhere shows she was seen by Dr. Christoper Fabian originally with [facility]. ERCP in 2012 by Dr. Christoper Fabian with moderate stricture of the distal CBD with moderate dilatation of CBD, common hepatic duct, and intrahepatic biliary tree. In Aug 2012, a fully covered metal wall sten (10X 40 mm) was placed. Brushings around 2012 negative for malignancy. When seen by Dr. Jerene Pitch at [facility], question of secondary sclerosing cholangitis. She was inpatient in [place] and was assessed by Dr. Paulita Fujita in 2015 with abdominal pain and abnormal CT scan noting thickening of the head of pancreas with non-pathologic sized peripancreatic adenopathy. Felt she may have acute on chronic pancreatitis at that time. She was to follow-up with Dr. Paulita Fujita Jan 2016 but did not do this. CA 19-9 low in 2015. Last CT in Jan 2016 with resolving stranding around pancreatic head.   Has started to have pain under LUQ and RUQ. States abdominal pain is chronic but worsening. Has fatigue, body feels heavy.  States her pancreatitis flares are described as : swelling of abdomen.   Will have a big knot poked out in upper abdomen, intermittently. Questran helped in the beginning but now makes it worse. Prilosec once daily. Lots of regurgitating due to indigestion. Protonix didn't work. Chronic joint pain diffusely.   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   Liver biopsy by Dr. Doyle Askew 2011: non-specific findings. Looking back at LFTs, she has had bumps in transaminases from the upper 200s  to 400/600, with bilirubin ranging from 1-3. In 2012. 2013 had fluctuating transaminases as well but less severe. During hospitalization in [place] Dec 2015, peak AST 243, ALT 206, bilirubin normal at 1. A,lk phos 342, improving with supportive care. ERCP was avoided during that hospitalization due to possible history of sclerosing cholangitis due to risk of precipitating or worsening cholangitis.     Outside labs from Sept 2018 with normal transaminases, alk phos, Tibili. IBD panel was ordered by PCP as well which was all negative (atypical pANCA negative, Saccharomyces cerevisia, IgA negative, Saccharomyces cerevisia, IgG negative). Lipase 12. Triglycerides high at 311.   Her weight has fluctuated since 2013, with a low of 120s in 2013 but now 148. In 2016, weighed in the 150s. Regardless, weight is improved from 2013 overall.   Past Medical History:  Diagnosis Date  . Arthritis   . Chronic back pain   . Migraines   . Pancreatitis     Past Surgical History:  Procedure Laterality Date  . BILE DUCT STENT PLACEMENT  07/2011   Dr. Patrick North: fully covered metal wall stent placed.   . CHOLECYSTECTOMY  2010   with bile leak  . [facility]  . ERCP  2011-2013   multiple, with multiple stent placements/exchanges. Metal wall stent placed in Aug 2012 AND STILL PRESENT AS OF 2019    Current Outpatient Medications  Medication Sig Dispense Refill  . ALPRAZolam (XANAX) 0.5 MG tablet Take 1 mg by mouth at bedtime. 0.5 mg in the daytime    . GLUCOSAMINE-CHONDROITIN PO Take by mouth daily.    Marland Kitchen ibuprofen (ADVIL,MOTRIN) 800 MG tablet Take 800 mg by mouth every 8 (eight) hours as needed.    . Melatonin 5 MG TABS Take 1 tablet by mouth at bedtime.    Marland Kitchen omeprazole (PRILOSEC) 20 MG capsule Take 20 mg by mouth daily.    . promethazine (PHENERGAN) 25 MG tablet Take 25 mg by mouth every 6 (six) hours as needed for nausea or vomiting.     No current facility-administered medications for this visit.     Allergies as of 12/28/2017 - Review Complete 12/28/2017  Allergen Reaction Noted  . Imitrex [sumatriptan] Anaphylaxis and Rash 06/15/2012  . Ciprofloxacin  02/12/2016  . Cymbalta [duloxetine hcl]  02/12/2016  . Doxycycline Diarrhea 06/15/2012  . Lyrica [pregabalin] Swelling and Other (See Comments) 06/15/2012  . Nucynta [tapentadol]  05/20/2015  . Nuvigil  [armodafinil]  02/12/2016  . Other  06/06/2015  . Penicillins  02/12/2016  . Amoxicillin-pot clavulanate Diarrhea and Rash 06/15/2012  . Butrans [buprenorphine] Hives, Swelling, and Rash 06/15/2012    Family History  Problem Relation Age of Onset  . Cervical cancer Mother   . Heart attack Unknown   . Migraines Neg Hx   . Seizures Neg Hx   . Dementia Neg Hx   . Colon cancer Neg Hx     Social History   Socioeconomic History  . Marital status: Married    Spouse name: Shanon Brow  . Number of children: 2  . Years of education: 23  . Highest education level: Not on file  Social Needs  . Financial resource strain: Not on file  . Food insecurity - worry: Not on file  . Food insecurity - inability: Not on file  . Transportation needs - medical: Not on file  . Transportation needs - non-medical: Not on file  Occupational History  . Occupation: Workers comp  Tobacco Use  . Smoking  status: Former Smoker    Types: Cigarettes    Last attempt to quit: 12/09/2011    Years since quitting: 6.0  . Smokeless tobacco: Never Used  Substance and Sexual Activity  . Alcohol use: No  . Drug use: No  . Sexual activity: Not on file  Other Topics Concern  . Not on file  Social History Narrative   Lives w/ husband   Caffeine use:  none    Review of Systems: As mentioned in HPI   Physical Exam: BP 133/83   Pulse 67   Temp 97.6 F (36.4 C) (Oral)   Ht 5' 6"  (1.676 m)   Wt 148 lb (67.1 kg)   LMP 05/16/2012   BMI 23.89 kg/m  General:   Alert and oriented. Pleasant and cooperative. Well-nourished and well-developed.  Head:  Normocephalic and atraumatic. Eyes:  Without icterus, sclera clear and conjunctiva pink.  Ears:  Normal auditory acuity. Nose:  No deformity, discharge,  or lesions. Mouth:  No deformity or lesions, oral mucosa pink. Lungs:  Clear to auscultation bilaterally. No wheezes, rales, or rhonchi. No distress.  Heart:  S1, S2 present without murmurs appreciated.  Abdomen:   +BS, soft, mild TTP supra-umbilically with what appears to be possible ventral hernia vs rectus diastasis, and non-distended. No HSM noted. No guarding or rebound. No masses appreciated.  Rectal:  Deferred  Msk:  Symmetrical without gross deformities. Normal posture. Extremities:  Without edema. Neurologic:  Alert and  oriented x4 Psych:  Alert and cooperative. Normal mood and affect.

## 2017-12-28 NOTE — Telephone Encounter (Signed)
Tried to call pt to schedule CT abd/pelvis, no answer, LMOAM for her to call office.

## 2017-12-28 NOTE — Telephone Encounter (Signed)
Pt called office. CT abd/pelvis w/contrast scheduled for 01/07/18 at 4:00pm, pt to arrive at 3:45pm. Pt to pick up contrast, NPO 4 hours prior to test. Called pt back and informed her of appt. Letter mailed.

## 2017-12-28 NOTE — Patient Instructions (Signed)
I would like for you to get blood work done today. We have also ordered a CT scan.  Let's stop Prilosec for now. I have given samples of Dexilant to try. Let me know how you like this!  You will need a colonoscopy in the near future, but I need to review everything that has been done thus far. We will call you in the near future for further recommendations and to arrange colonoscopy.

## 2017-12-29 ENCOUNTER — Ambulatory Visit (INDEPENDENT_AMBULATORY_CARE_PROVIDER_SITE_OTHER): Payer: Commercial Managed Care - PPO

## 2017-12-29 ENCOUNTER — Ambulatory Visit: Payer: Self-pay | Admitting: Nurse Practitioner

## 2017-12-29 DIAGNOSIS — D519 Vitamin B12 deficiency anemia, unspecified: Secondary | ICD-10-CM

## 2017-12-29 DIAGNOSIS — E538 Deficiency of other specified B group vitamins: Secondary | ICD-10-CM

## 2017-12-29 NOTE — Nursing Note (Signed)
12/29/17 1500   Medication Administration   Initials sjm   Medication  Cyanocobalamin B-12   Medication Dose 1cc   Route of Administration IM   Site Right Deltoid   [facility] # [id number]   [facility] Supplied No   Patient Supplied Yes

## 2017-12-31 LAB — COMPLETE METABOLIC PANEL WITH GFR
AG RATIO: 1.8 (calc) (ref 1.0–2.5)
ALBUMIN MSPROF: 4.4 g/dL (ref 3.6–5.1)
ALT: 10 U/L (ref 6–29)
AST: 14 U/L (ref 10–35)
Alkaline phosphatase (APISO): 85 U/L (ref 33–130)
BUN: 10 mg/dL (ref 7–25)
CALCIUM: 9.3 mg/dL (ref 8.6–10.4)
CO2: 28 mmol/L (ref 20–32)
CREATININE: 0.72 mg/dL (ref 0.50–1.05)
Chloride: 107 mmol/L (ref 98–110)
GFR, EST NON AFRICAN AMERICAN: 96 mL/min/{1.73_m2} (ref >=60)
GFR, Est African American: 111 mL/min/{1.73_m2} (ref >=60)
GLOBULIN: 2.4 g/dL (ref 1.9–3.7)
Glucose, Bld: 92 mg/dL (ref 65–99)
POTASSIUM: 4.2 mmol/L (ref 3.5–5.3)
SODIUM: 142 mmol/L (ref 135–146)
TOTAL PROTEIN: 6.8 g/dL (ref 6.1–8.1)
Total Bilirubin: 0.4 mg/dL (ref 0.2–1.2)

## 2017-12-31 LAB — CBC WITH DIFFERENTIAL/PLATELET
BASOS PCT: 0.5 %
Basophils Absolute: 29 cells/uL (ref 0–200)
EOS PCT: 1.9 %
Eosinophils Absolute: 110 cells/uL (ref 15–500)
HCT: 39.5 % (ref 35.0–45.0)
Hemoglobin: 13.2 g/dL (ref 11.7–15.5)
LYMPHS ABS: 1525 {cells}/uL (ref 850–3900)
MCH: 28.2 pg (ref 27.0–33.0)
MCHC: 33.4 g/dL (ref 32.0–36.0)
MCV: 84.4 fL (ref 80.0–100.0)
MPV: 10.2 fL (ref 7.5–12.5)
Monocytes Relative: 7.1 %
NEUTROS PCT: 64.2 %
Neutro Abs: 3724 cells/uL (ref 1500–7800)
Platelets: 213 10*3/uL (ref 140–400)
RBC: 4.68 10*6/uL (ref 3.80–5.10)
RDW: 12.9 % (ref 11.0–15.0)
Total Lymphocyte: 26.3 %
WBC: 5.8 10*3/uL (ref 3.8–10.8)
WBCMIX: 412 {cells}/uL (ref 200–950)

## 2017-12-31 LAB — MITOCHONDRIAL ANTIBODIES

## 2017-12-31 LAB — IGG, IGA, IGM
IGG (IMMUNOGLOBIN G), SERUM: 745 mg/dL (ref 694–1618)
IMMUNOGLOBULIN A: 74 mg/dL — AB (ref 81–463)
IgM, Serum: 76 mg/dL (ref 48–271)

## 2017-12-31 LAB — LIPASE: Lipase: 6 U/L — ABNORMAL LOW (ref 7–60)

## 2017-12-31 LAB — IGG SUBCLASS 4: IGG SUBCLASS 4: 23.3 mg/dL (ref 4.0–86.0)

## 2017-12-31 LAB — TISSUE TRANSGLUTAMINASE, IGA: (tTG) Ab, IgA: 1 U/mL

## 2018-01-01 NOTE — Telephone Encounter (Signed)
PA info for CT abd/pelvis w/contrast submitted via [employer] website. Case authorized. Order ID: [id number], valid 01/01/18-01/30/18.

## 2018-01-02 ENCOUNTER — Encounter: Payer: Self-pay | Admitting: Gastroenterology

## 2018-01-02 NOTE — Assessment & Plan Note (Signed)
Last colonoscopy in 2011 at [facility] and requesting records. Now with intermittent rectal bleeding. Will need colonoscopy with Propofol in the future but arranging CT first. Will check celiac serologies today.

## 2018-01-02 NOTE — Assessment & Plan Note (Signed)
No improvement with Prilosec historically or Protonix. Symptomatic GERD. No dysphagia. Trial of Dexilant.

## 2018-01-02 NOTE — Assessment & Plan Note (Signed)
52 year old female with chronic abdominal pain since at least 2011. Underwent cholecystectomy in 2010 with complications of bile leak thereafter, undergoing multiple ERCPs with stent placement/exchange per her report. I have spent over an hour reviewing care everywhere, as she has been seen by Dr. Opal SidlesSweeney originally for ERCPs, then [facility] for an additional ER[facility] in 2013, and [facility] while inpatient in [place] in 2015. This is the 4th GI evaluation she has had. A fully covered metal wall stent was placed in Aug 2012 by Dr. Opal SidlesSweeney, which still remains. [facility] March [facility] with normal pancreas and normal endoscopic findings. Bile duct brushing in 2012 were negative for malignancy, and a liver biopsy in Jan 2011 had non-specific findings. Significant fluctuations in transaminases noted over the years as in HPI, and most recent LFTs in Sept 2018 are completely normal. 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 She has not had evaluation since Dec 2015 for her multiple issues, complicated history, and multiple loose ends.   I'm ordering a CT today and basic lab work. She feels her flares of pain are related to pancreatitis, but her description of "pancreatitis flares" include swelling of her abdomen without any associated factors. On physical exam, I question a possible ventral hernia. She is non-toxic appearing today, and we will proceed with CT in near future. She may be best served at a tertiary facility for further extensive  hepatobiliary evaluation (return to see [facility], specifically Dr. Lanell MatarMishra) due to her complicated past medical history. However, we can address her other multiple concerns here in the interim. Further recommendations after CT and blood work.

## 2018-01-04 ENCOUNTER — Encounter: Payer: Self-pay | Admitting: Gastroenterology

## 2018-01-04 ENCOUNTER — Telehealth: Payer: Self-pay | Admitting: Internal Medicine

## 2018-01-04 NOTE — Telephone Encounter (Signed)
[phone]  PLEASE CALL PATIENT, SHE IS HAVING ABD PAIN

## 2018-01-04 NOTE — Progress Notes (Signed)
Liver numbers, pancreas number, blood count, kidneys, all normal. 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 We need to order a TTg, IgG to wrap up celiac serologies. IgG subclass 4 is normal.

## 2018-01-04 NOTE — Progress Notes (Signed)
CC'D TO PCP °

## 2018-01-04 NOTE — Telephone Encounter (Signed)
Please see result note. Basically, labs unrevealing. We do need to check a TTg, IgG to wrap up evaluation for celiac serologies. I'm glad the pain is better. I would do a low fat/bland diet, sip liquids. If diarrhea continues tomorrow, let me know. I am not sure if she is on pancreatic enzymes or not, but we could try that if there is a concern for chronic pancreatitis.

## 2018-01-04 NOTE — Telephone Encounter (Signed)
Pts pain started 01/01/2018 and pt had to leave work. Pts pain eased up some but pt is having pain near her pancrease. Nausea started Sunday and has continued today. Pt took phenergan nightly when nausea starts. Diarrhea started Friday and was watery as well. Pt has been able to eat food and keep it down. Pt is scheduled for a CT scan this Thursday 01/07/18 and pt wants to know what she should do until then. Pt is taking Dexilant 60mg  samples which were given at her last apt 12/28/17.   Pt also wants to know about her lab results.

## 2018-01-05 ENCOUNTER — Other Ambulatory Visit: Payer: Self-pay

## 2018-01-05 DIAGNOSIS — R109 Unspecified abdominal pain: Secondary | ICD-10-CM

## 2018-01-05 NOTE — Telephone Encounter (Signed)
Tried calling pt on HM & cell # number, VM left. Waiting on a return call.

## 2018-01-05 NOTE — Telephone Encounter (Signed)
If she is able, can come pick up samples of Creon 36,000 units.   Take 2 with meals three times a day, 1 with snacks. Needs to take while eating, not before.   If pain is worsening, needs CT sooner than already scheduled (Wednesday). It is on schedule for Thursday.

## 2018-01-05 NOTE — Telephone Encounter (Signed)
Pt notified, samples are upfront ready for pick up.

## 2018-01-05 NOTE — Telephone Encounter (Signed)
Spoke with pt. Pt was given results. Orders for labs will be put in for [facility], pt would prefer to go to [facility]. Pt isn't taking any digestive enzymes. When asked if it was something pt would like to try due to the pancreatitis, pt stated "that's what I come to her for". No yes or no answer was given in reference to the enzymes.

## 2018-01-07 ENCOUNTER — Other Ambulatory Visit (HOSPITAL_COMMUNITY)
Admission: RE | Admit: 2018-01-07 | Discharge: 2018-01-07 | Disposition: A | Discharge status: Home / Self Care | Payer: BLUE CROSS/BLUE SHIELD | Source: Ambulatory Visit | Attending: Gastroenterology | Admitting: Gastroenterology

## 2018-01-07 ENCOUNTER — Ambulatory Visit (HOSPITAL_COMMUNITY)
Admission: RE | Admit: 2018-01-07 | Discharge: 2018-01-07 | Disposition: A | Discharge status: Home / Self Care | Payer: BLUE CROSS/BLUE SHIELD | Source: Ambulatory Visit | Attending: Gastroenterology | Admitting: Gastroenterology

## 2018-01-07 DIAGNOSIS — I700 Atherosclerosis of aorta: Secondary | ICD-10-CM | POA: Insufficient documentation

## 2018-01-07 DIAGNOSIS — R1030 Lower abdominal pain, unspecified: Secondary | ICD-10-CM | POA: Diagnosis present

## 2018-01-07 DIAGNOSIS — R198 Other specified symptoms and signs involving the digestive system and abdomen: Secondary | ICD-10-CM | POA: Diagnosis not present

## 2018-01-07 DIAGNOSIS — R109 Unspecified abdominal pain: Secondary | ICD-10-CM | POA: Insufficient documentation

## 2018-01-07 DIAGNOSIS — Z9049 Acquired absence of other specified parts of digestive tract: Secondary | ICD-10-CM | POA: Diagnosis not present

## 2018-01-07 MED ORDER — IOPAMIDOL (ISOVUE-300) INJECTION 61%
100.0000 mL | Freq: Once | INTRAVENOUS | Status: AC | PRN
  Administered 2018-01-07: 100 mL via INTRAVENOUS

## 2018-01-08 ENCOUNTER — Telehealth: Payer: Self-pay | Admitting: Gastroenterology

## 2018-01-08 NOTE — Telephone Encounter (Signed)
Is there an opening with me in the next 1-2 weeks for an urgent office visit? If not, let me know. I would like to see the patient back to make sure we have addressed everything before we do the procedures, BUT:  Can we go ahead and put her on the books for a colonoscopy/EGD with Dr. Jena Gaussourk with Propofol (she will be seeing me in the interim). I think we need another face to face before the actual procedures, but it would be nice to go ahead and hold this slot so that it's not prolonged any further.

## 2018-01-10 LAB — TISSUE TRANSGLUTAMINASE, IGG

## 2018-01-11 NOTE — Telephone Encounter (Signed)
Do we have an appt for her to see me in the interim?

## 2018-01-11 NOTE — Telephone Encounter (Signed)
You have an urgent 01/28/18

## 2018-01-11 NOTE — Telephone Encounter (Signed)
That's fine. Definitely needs to be seen before 2/28.

## 2018-01-11 NOTE — Telephone Encounter (Signed)
Holding spot for TCS/EGD w/Propofol 02/04/18 at 1:30pm.

## 2018-01-12 ENCOUNTER — Ambulatory Visit (INDEPENDENT_AMBULATORY_CARE_PROVIDER_SITE_OTHER): Payer: Commercial Managed Care - PPO

## 2018-01-12 DIAGNOSIS — D519 Vitamin B12 deficiency anemia, unspecified: Secondary | ICD-10-CM

## 2018-01-12 DIAGNOSIS — E538 Deficiency of other specified B group vitamins: Secondary | ICD-10-CM

## 2018-01-12 NOTE — Nursing Note (Signed)
01/12/18 1400   Medication Administration   Initials ac   Medication  Cyanocobalamin B-12   Medication Dose 1 ml   Route of Administration IM   Site Left Deltoid   NDC # [id number]   LOT # [id number]   Expiration date 09/07/19   Clinic Supplied No   Patient Supplied Yes

## 2018-01-20 ENCOUNTER — Ambulatory Visit: Payer: BLUE CROSS/BLUE SHIELD | Admitting: Gastroenterology

## 2018-01-20 ENCOUNTER — Encounter: Payer: Self-pay | Admitting: *Deleted

## 2018-01-20 ENCOUNTER — Encounter: Payer: Self-pay | Admitting: Gastroenterology

## 2018-01-20 ENCOUNTER — Other Ambulatory Visit: Payer: Self-pay | Admitting: *Deleted

## 2018-01-20 VITALS — BP 120/81 | HR 86 | Temp 98.2°F | Ht 66.0 in | Wt 143.6 lb

## 2018-01-20 DIAGNOSIS — R109 Unspecified abdominal pain: Secondary | ICD-10-CM

## 2018-01-20 DIAGNOSIS — K529 Noninfective gastroenteritis and colitis, unspecified: Secondary | ICD-10-CM

## 2018-01-20 DIAGNOSIS — G8929 Other chronic pain: Secondary | ICD-10-CM | POA: Diagnosis not present

## 2018-01-20 MED ORDER — PANCRELIPASE (LIP-PROT-AMYL) 36000-114000 UNITS PO CPEP: 72000.0000 [IU] | ORAL_CAPSULE | Freq: Three times a day (TID) | ORAL | 3 refills | Status: DC

## 2018-01-20 MED ORDER — CLENPIQ 10-3.5-12 MG-GM -GM/160ML PO SOLN: 1.0000 | Freq: Once | ORAL | 0 refills | Status: AC

## 2018-01-20 NOTE — Patient Instructions (Signed)
Continue the Creon.   We have scheduled you for a colonoscopy, upper endoscopy, and dilation as appropriate in the near future!  Further recommendations to follow.  It was a pleasure to see you today. I strive to create trusting relationships with patients to provide genuine, compassionate, and quality care. I value your feedback. If you receive a survey regarding your visit,  I greatly appreciate you the taking time to fill this out.   Gelene MinkAnna W. Lindell Renfrew, PhD, ANP-BC [facility]

## 2018-01-20 NOTE — Progress Notes (Addendum)
REVIEWED. Pt SEEN FOR EGD MAR 26 26. Debris in BILIARY stent lumen. Pt aware she should see [facility] or Dr. Opal Sidles to manage stent occlusion. FLUCTUATING LIVER ENZYMES ARE MOST LIKELY DUE TO INTERMITTENT OCCLUSION OF STENT. PT VOICED HER UNDERSTANDING. NO NEED FOR ADDITIONAL WORKUP FOR ELEVATED LIVER ENZYMES.  PT STATED SHE WAS ALLERGIC TO BENTYL. "IT CAUSES DIARRHEA." LISTED AS AN ALLERGY. STRICTLY FOLLOW A LOW FAT DIET. USE IMODIUM BID TO CONTROL DIARRHEA. CHEW ONE TUMS WITH MEALS THREE TIMES A DAY TO PREVENT DIARRHEA.   CC: PCP  Referring Provider: Richardean Chimera, MD Primary Care Physician:  Richardean Chimera, MD Primary GI: Dr. Darrick Penna   Chief Complaint  Patient presents with  . Abdominal Pain    pre procedure visit    HPI:   Ann [name] is a 52 y.o. female presenting today with a history of chronic abdominal pain. Initially seen as a new patient here 12/28/17, and after review of Care Everywhere, it was noted she had been seen at [facility], by [facility] while inpatient in 2016, and by [facility]. She presented to our office for a screening colonoscopy, which was last done by [facility] in 2011. I still do not have these records despite requesting. 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 She reports multiple ERCPs at outside facility with multiple stents placed. She notes that each time stent would be removed, she would develop abdominal pain. She reports having "8-10 ERCPs". Liver biopsy by Dr. Milinda Antis 2011: non-specific findings. Looking back at LFTs, she has had bumps in transaminases from the upper 200s to 400/600, with bilirubin ranging from 1-3.  2013 had fluctuating transaminases as well but less severe. During hospitalization in [facility] Dec 2015 for acute pancreatitis, peak AST 243, ALT 206, bilirubin normal at  1. A,lk phos 342, improving with supportive care. 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     ERCP in 2012 by Dr. Opal Sidles with moderate stricture of the distal CBD with moderate dilatation of CBD, common hepatic duct, and intrahepatic biliary tree. In Aug 2012, a fully covered metal wall stent (10X 40 mm) was placed. Brushings around 2012 negative for malignancy. When seen by Dr. Lanell Matar at [facility] in 2013, question of secondary sclerosing cholangitis. EGD/EUS March 2013 normal. Recommended stent removal at that time, but she declined due to fear that pain would recur. She was inpatient in [place] and was assessed by Dr. Dulce Sellar in 2015 with abdominal pain and abnormal CT scan noting thickening of the head of pancreas with non-pathologic sized peripancreatic adenopathy. Felt she may have acute on chronic pancreatitis at that time. She was to follow-up with Dr. Dulce Sellar Jan 2016 but did not do this. CA 19-9 low in 2015  Her weight has fluctuated since 2013, with a low of 120s in 2013 but now 148. In 2016, weighed in the 150s. Regardless, weight is improved from 2013 overall. When I saw her in January, she was having LUQ and RUQ pain. Chronic pain worsening. Felt like a knot would bulge out in upper abdomen. Lots of regurgitating due to reflux. Protonix without help. Trial of Dexilant.   I updated a CT Jan 2019: No acute findings. Stable appearance of CBD stent. Absent gallbladder. 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 AMA negative, lipase normal, IgG subclass 4 normal, CMP normal, CBC normal. Started her on empiric pancreatic enzymes in interim.   Plans for colonoscopy, EGD with small bowel biopsies. Diarrhea fluctuates. Questran used to work but not any longer. Some days doesn't go at all. [place] Dexilant helping with reflux. Noticed  a big difference with taking Creon. Eating only once to twice a day. Noted improvement in abdominal pain. Stool consistency is a little better with Creon. Notes solid food dysphagia and pill dysphagia.   Notes a history of PTSD and requesting female physician for procedures.   Past Medical History:  Diagnosis Date  . Arthritis   . Chronic back pain   . Migraines   . Pancreatitis     Past Surgical History:  Procedure Laterality Date  . BILE DUCT STENT PLACEMENT  07/2011   Dr. Steffanie DunnSweeny: fully covered metal wall stent placed.   . CHOLECYSTECTOMY  2010   with bile leak  . COLONOSCOPY  2011   [facility]  . ERCP  2011-2013   multiple, with multiple stent placements/exchanges. Metal wall stent placed in Aug 2012 AND STILL PRESENT AS OF 2019    Current Outpatient Medications  Medication Sig Dispense Refill  . ALPRAZolam (XANAX) 0.5 MG tablet Take 1 mg by mouth at bedtime. 0.5 mg in the daytime    . dexlansoprazole (DEXILANT) 60 MG capsule Take 60 mg by mouth as needed.    Marland Kitchen. ibuprofen (ADVIL,MOTRIN) 800 MG tablet Take 800 mg by mouth every 8 (eight) hours as needed.    . Melatonin 5 MG TABS Take 1 tablet by mouth at bedtime.    . Pancrelipase, Lip-Prot-Amyl, (CREON PO) Take by mouth 2 (two) times daily with a meal.    . promethazine (PHENERGAN) 25 MG tablet Take 25 mg by mouth every 6 (six) hours as needed for nausea or vomiting.    . lipase/protease/amylase (CREON) 36000 UNITS CPEP capsule Take 2 capsules (72,000 Units total) by mouth 3 (three) times daily with meals. 1 with snacks 240 capsule 3   No current facility-administered medications for this visit.     Allergies as of 01/20/2018 - Review Complete 01/20/2018  Allergen Reaction Noted  . Imitrex [sumatriptan] Anaphylaxis and Rash 06/15/2012  . Ciprofloxacin  02/12/2016  . Cymbalta [duloxetine hcl]  02/12/2016  . Doxycycline Diarrhea 06/15/2012  . Lyrica [pregabalin] Swelling and Other (See Comments) 06/15/2012  .  Nucynta [tapentadol]  05/20/2015  . Nuvigil [armodafinil]  02/12/2016  . Other  06/06/2015  . Penicillins  02/12/2016  . Amoxicillin-pot clavulanate Diarrhea and Rash 06/15/2012  . Butrans [buprenorphine] Hives, Swelling, and Rash 06/15/2012    Family History  Problem Relation Age of Onset  . Cervical cancer Mother   . Heart attack Unknown   . Migraines Neg Hx   . Seizures Neg Hx   . Dementia Neg Hx   . Colon cancer Neg Hx     Social History   Socioeconomic History  . Marital status: Married    Spouse name: Onalee HuaDavid  . Number of children: 2  . Years of education: 3911  . Highest education level: None  Social Needs  . Financial resource strain: None  . Food insecurity - worry: None  . Food insecurity - inability: None  . Transportation needs - medical: None  . Transportation needs - non-medical: None  Occupational History  . Occupation: Workers comp  Tobacco Use  . Smoking status: Former Smoker  Types: Cigarettes    Last attempt to quit: 12/09/2011    Years since quitting: 6.1  . Smokeless tobacco: Never Used  Substance and Sexual Activity  . Alcohol use: No  . Drug use: No  . Sexual activity: None  Other Topics Concern  . None  Social History Narrative   Lives w/ husband   Caffeine use:  none    Review of Systems: As mentioned in HPI   Physical Exam: BP 120/81   Pulse 86   Temp 98.2 F (36.8 C) (Oral)   Ht 5\' 6"  (1.676 m)   Wt 143 lb 9.6 oz (65.1 kg)   LMP 05/16/2012   BMI 23.18 kg/m  General:   Alert and oriented. No distress noted. Pleasant and cooperative.  Head:  Normocephalic and atraumatic. Eyes:  Conjuctiva clear without scleral icterus. Mouth:  Oral mucosa pink and moist. Good dentition. No lesions. Abdomen:  +BS, soft, non-tender and non-distended. No rebound or guarding. No HSM or masses noted. Msk:  Symmetrical without gross deformities. Normal posture. Extremities:  Without edema. Neurologic:  Alert and  oriented x4 Psych:  Alert and  cooperative. Normal mood and affect.  Lab Results  Component Value Date   WBC 5.8 12/28/2017   HGB 13.2 12/28/2017   HCT 39.5 12/28/2017   MCV 84.4 12/28/2017   PLT 213 12/28/2017   Lab Results  Component Value Date   ALT 10 12/28/2017   AST 14 12/28/2017   ALKPHOS 75 06/06/2015   BILITOT 0.4 12/28/2017   Lab Results  Component Value Date   CREATININE 0.72 12/28/2017   BUN 10 12/28/2017   NA 142 12/28/2017   K 4.2 12/28/2017   CL 107 12/28/2017   CO2 28 12/28/2017   Lab Results  Component Value Date   LIPASE 6 (L) 12/28/2017

## 2018-01-21 ENCOUNTER — Telehealth: Payer: Self-pay | Admitting: Gastroenterology

## 2018-01-21 NOTE — Telephone Encounter (Signed)
Mindy and Darlina RumpfMartina: I am sorry, patient's procedures should be with Dr. Darrick PennaFields, not Dr. Jena Gaussourk. I am sorry for any inconvenience! She was on for 2/28.   (Patient has a history of PTSD and requesting a female physician).

## 2018-01-21 NOTE — Telephone Encounter (Signed)
LMOVM. Called [facility] and procedure cancelled with [facility]

## 2018-01-22 ENCOUNTER — Encounter: Payer: Self-pay | Admitting: Gastroenterology

## 2018-01-22 ENCOUNTER — Encounter: Payer: Self-pay | Admitting: *Deleted

## 2018-01-22 ENCOUNTER — Telehealth: Payer: Self-pay

## 2018-01-22 ENCOUNTER — Other Ambulatory Visit: Payer: Self-pay | Admitting: *Deleted

## 2018-01-22 ENCOUNTER — Telehealth: Payer: Self-pay | Admitting: *Deleted

## 2018-01-22 DIAGNOSIS — R109 Unspecified abdominal pain: Secondary | ICD-10-CM

## 2018-01-22 DIAGNOSIS — G8929 Other chronic pain: Secondary | ICD-10-CM

## 2018-01-22 DIAGNOSIS — K529 Noninfective gastroenteritis and colitis, unspecified: Secondary | ICD-10-CM

## 2018-01-22 DIAGNOSIS — K219 Gastro-esophageal reflux disease without esophagitis: Secondary | ICD-10-CM

## 2018-01-22 NOTE — Telephone Encounter (Signed)
LMOVM

## 2018-01-22 NOTE — Telephone Encounter (Signed)
I have just started PA for the Creon.

## 2018-01-22 NOTE — Telephone Encounter (Signed)
Pre-op scheduled for 02/23/18 at 1:45pm. Letter mailed. Patient email sent

## 2018-01-22 NOTE — Telephone Encounter (Signed)
Patient called back. She is scheduled for 03/02/18 at 7:30am. New instructions mailed to pt.

## 2018-01-26 NOTE — Telephone Encounter (Signed)
LMOM to call.

## 2018-01-26 NOTE — Telephone Encounter (Signed)
Why was it denied? Do they need an alternative agent?

## 2018-01-26 NOTE — Telephone Encounter (Signed)
Zenpep is great with me. Let's give samples of 40,000 units. Take 2 capsules with meals, one with snacks.

## 2018-01-26 NOTE — Telephone Encounter (Signed)
The PA was denied for the Creon.  Please advise!

## 2018-01-26 NOTE — Telephone Encounter (Signed)
They said she must try Zenpep.

## 2018-01-27 NOTE — Telephone Encounter (Signed)
Pt is aware and will come and pick up the samples of Zenpep ( 36 tablets at front). She said the Creon was really working good, but she will give this a try.

## 2018-01-28 NOTE — Assessment & Plan Note (Signed)
52 year old female with chronic abdominal pain since at least 2011. Complicated past medical history, undergoing cholecystectomy at outside facility in 2010 with bile leak thereafter and undergoing multiple ERCPs with stent placement/exchanges per her report. Fully covered metal wall stent placed in Aug 2012 by Dr. Opal SidlesSweeney, still remains. [facility] normal pancreas. 2012 normal bile duct brushing. Liver biopsy Jan 2011 with non-specific findings. Significant fluctuations in transaminases noted over the past years as noted in HPI, most recent LFTs completely normal. Some question of chronic pancreatitis when [place] in [place] in [place]. 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 CT updated Jan 2019 without acute findings, stent in place. LFTs, lipase normal. Celiac serologies normal. Improvement in abdominal pain with pancreatic enzymes, but Creon is not covered by insurance. I have sent in Zenpep.   Also notable solid food and pill dysphagia. As last endoscopic evaluation (by way of EUS ) was in March 2013, recommend EGD with dilation due to dysphagia/dyspepsia, and small bowel biopsies due to chronic diarrhea. [facility] due to complicated past hepatobiliary history. However, it is encouraging that she feels improved now.   Proceed with upper endoscopy/dilation, and small bowel biopsies in the near future with Dr. Darrick PennaFields. The risks, benefits, and alternatives have been discussed in detail with patient. They have stated understanding and desire to proceed.  Propofol due to polypharmacy Zenpep sent to pharmacy

## 2018-01-28 NOTE — Assessment & Plan Note (Signed)
Improved with pancreatic enzymes. Weight is stable. Likely multifactorial., Colonoscopy last completed in [facility] by [facility], which I have been unable to retrieve despite multiple requests. She has had occasional low-volume rectal bleeding. Proceeding with colonoscopy at time of EGD with Propofol.

## 2018-01-28 NOTE — Progress Notes (Signed)
cc'ed to pcp °

## 2018-01-29 ENCOUNTER — Encounter: Payer: Self-pay | Admitting: Gastroenterology

## 2018-01-29 ENCOUNTER — Other Ambulatory Visit (HOSPITAL_COMMUNITY): Payer: Self-pay

## 2018-02-01 ENCOUNTER — Other Ambulatory Visit (HOSPITAL_BASED_OUTPATIENT_CLINIC_OR_DEPARTMENT_OTHER): Payer: Self-pay | Admitting: Family Medicine

## 2018-02-01 ENCOUNTER — Telehealth: Payer: Self-pay | Admitting: Gastroenterology

## 2018-02-01 ENCOUNTER — Telehealth: Payer: Self-pay

## 2018-02-01 DIAGNOSIS — R110 Nausea: Secondary | ICD-10-CM

## 2018-02-01 MED ORDER — ONDANSETRON 8 MG DISINTEGRATING TABLET: 8 mg | Tab | Freq: Three times a day (TID) | ORAL | 1 refills | 0 days | Status: DC | PRN

## 2018-02-01 NOTE — Telephone Encounter (Signed)
Ann [name]: patient has tried Zenpep and it is making her symptoms worse. She did well on Creon. Can we see if insurance will now cover it? See prior phone notes regarding PA.

## 2018-02-01 NOTE — Telephone Encounter (Signed)
Ann [name], I am leaving samples of the Creon 36,000 units at the front for pt to pick up per her e-mail request.  I am working on the [place].

## 2018-02-01 NOTE — Telephone Encounter (Signed)
I called [employer] and spoke with Margit BandaJohn M.  He said Zenpep was the only other medication for pt to try. He is aware patient has tried and failed and he just wants me to fax the information to them.  ATTN [employer] @ [phone] and still may have to do a peer to peer.  Tobi Bastosnna, can you write a letter stating the need and I will fax it to them.

## 2018-02-01 NOTE — Telephone Encounter (Signed)
-----   Message from Monique [name] sent at 02/01/2018  3:03 PM EST -----  patient requests refill of zofran be sent to [facility]

## 2018-02-02 ENCOUNTER — Ambulatory Visit (INDEPENDENT_AMBULATORY_CARE_PROVIDER_SITE_OTHER): Payer: Commercial Managed Care - PPO

## 2018-02-02 DIAGNOSIS — D519 Vitamin B12 deficiency anemia, unspecified: Secondary | ICD-10-CM

## 2018-02-02 DIAGNOSIS — E538 Deficiency of other specified B group vitamins: Secondary | ICD-10-CM

## 2018-02-02 NOTE — Nursing Note (Signed)
02/02/18 1400   Medication Administration   Initials ac   Medication  Cyanocobalamin B-12   Medication Dose 1 ml   Route of Administration IM   Site Right Deltoid   NDC # [id number]   LOT # [id number]   Expiration date 09/07/19   Clinic Supplied No   Patient Supplied Yes

## 2018-02-03 ENCOUNTER — Encounter: Payer: Self-pay | Admitting: Gastroenterology

## 2018-02-03 NOTE — Telephone Encounter (Signed)
I completed the letter! 

## 2018-02-03 NOTE — Telephone Encounter (Signed)
Letter has been faxed to appeals @ [phone].

## 2018-02-04 ENCOUNTER — Encounter (HOSPITAL_COMMUNITY): Payer: Self-pay

## 2018-02-04 ENCOUNTER — Ambulatory Visit (HOSPITAL_COMMUNITY): Admit: 2018-02-04 | Payer: BLUE CROSS/BLUE SHIELD | Admitting: Internal Medicine

## 2018-02-04 SURGERY — COLONOSCOPY WITH PROPOFOL
Anesthesia: Monitor Anesthesia Care

## 2018-02-09 NOTE — Telephone Encounter (Signed)
Ann [name]: please collect some more Creon samples if we have any. Patient would like to pick this up. Can you let her know if we do?

## 2018-02-09 NOTE — Telephone Encounter (Signed)
Creon 36,000 units # 36 at front for pick up. LMOM for pt to stop by and pick up.

## 2018-02-10 NOTE — Telephone Encounter (Signed)
Received a letter and Creon has been approved. Reference # [id number] Effective Dates of authorization:  01/22/2018 through 12/07/2038. ( Copy to be scanned in).  LMOM for pt that her medication has been approved.

## 2018-02-13 ENCOUNTER — Encounter: Payer: Self-pay | Admitting: Gastroenterology

## 2018-02-14 ENCOUNTER — Encounter: Payer: Self-pay | Admitting: Gastroenterology

## 2018-02-15 ENCOUNTER — Ambulatory Visit (INDEPENDENT_AMBULATORY_CARE_PROVIDER_SITE_OTHER): Payer: Commercial Managed Care - PPO | Admitting: PHYSICIAN ASSISTANT

## 2018-02-15 ENCOUNTER — Encounter (HOSPITAL_BASED_OUTPATIENT_CLINIC_OR_DEPARTMENT_OTHER): Payer: Self-pay | Admitting: Family Medicine

## 2018-02-15 VITALS — BP 112/60 | HR 67 | Temp 96.2°F | Ht 66.0 in | Wt 208.0 lb

## 2018-02-15 DIAGNOSIS — I10 Essential (primary) hypertension: Secondary | ICD-10-CM

## 2018-02-15 DIAGNOSIS — E785 Hyperlipidemia, unspecified: Secondary | ICD-10-CM

## 2018-02-15 DIAGNOSIS — E118 Type 2 diabetes mellitus with unspecified complications: Secondary | ICD-10-CM

## 2018-02-15 DIAGNOSIS — G8929 Other chronic pain: Secondary | ICD-10-CM

## 2018-02-15 DIAGNOSIS — M546 Pain in thoracic spine: Secondary | ICD-10-CM

## 2018-02-15 DIAGNOSIS — Z6833 Body mass index (BMI) 33.0-33.9, adult: Secondary | ICD-10-CM

## 2018-02-15 DIAGNOSIS — H6983 Other specified disorders of Eustachian tube, bilateral: Secondary | ICD-10-CM

## 2018-02-15 DIAGNOSIS — E559 Vitamin D deficiency, unspecified: Secondary | ICD-10-CM

## 2018-02-15 LAB — POCT HGB A1C: POCT HGB A1C: 10.1 % — AB (ref 4–6)

## 2018-02-15 MED ORDER — BYDUREON 2 MG SUBCUTANEOUS EXTENDED RELEASE SUSPENSION
2.00 mg | SUBCUTANEOUS | 3 refills | Status: DC
Start: 2018-02-15 — End: 2018-07-13

## 2018-02-15 MED ORDER — FLUTICASONE PROPIONATE 50 MCG/ACTUATION NASAL SPRAY,SUSPENSION
2.0000 | Freq: Every day | NASAL | 1 refills | Status: DC
Start: 2018-02-15 — End: 2018-04-15

## 2018-02-15 MED ORDER — LORATADINE 10 MG TABLET
10.0000 mg | ORAL_TABLET | Freq: Every day | ORAL | 2 refills | Status: DC
Start: 2018-02-15 — End: 2018-11-10

## 2018-02-15 MED ORDER — FLASH GLUCOSE SENSOR KIT
PACK | 0 refills | Status: DC
Start: 2018-02-15 — End: 2018-11-22

## 2018-02-15 MED ORDER — METFORMIN 500 MG TABLET
500.00 mg | ORAL_TABLET | Freq: Two times a day (BID) | ORAL | 2 refills | Status: DC
Start: 2018-02-15 — End: 2018-07-13

## 2018-02-15 MED ORDER — FLASH GLUCOSE SCANNING READER
0 refills | Status: DC
Start: 2018-02-15 — End: 2018-11-22

## 2018-02-15 NOTE — Nursing Note (Signed)
02/15/18 1200   Medication Administration   Initials sjm   Medication  Cyanocobalamin B-12   Medication Dose 47ml   Route of Administration IM   Site Left Deltoid   NDC # [id number]   LOT # [id number]   Expiration date 08/27/18   Manufacturer [place] Yes   Patient Supplied No   Comments: needle removed. Tol well

## 2018-02-15 NOTE — Progress Notes (Signed)
Patient Name: Monique [name]  Date: 02/15/2018  [place], [place]  [address]  [place] [address]  [phone]  MRN: [id number]  DOB: Dec 27, 1965    Chief complaint: Follow Up 3 Months (diabetes check.).    HPI:  The patient is a 52 y.o. old female who came in today for a 3 month follow-up with multiple complaints:     Type II Diabetes mellitus: Last HbA1C on 11/16/2017 was 8.2%, improved from 9.2% previously. Current medications include Bydureon 2 mg SQ weekly. She was also previously taking metformin and Glyxambi, but these were discontinued after her weight loss surgery. She reports checking blood sugars at home sporadically, sometimes having readings > 400. She states that she has been on 2 rounds of steroids since her last HbA1C. She denies chest pain, dyspnea. She has chronic nausea, but attributes this to gastric bypass surgery. She admits to eating carbohydrates and sugars occasionally, but states that she tries to limit her portions.     Thoracic spine pain: Patient reports thoracic back pain that has been present and worsening over the past 3 years. She denies known injury at onset of pain. She describes the pain as sharp, stabbing (10/10 in intensity) and radiating in her lower back. She denies recent imaging for this problem. She denies numbness/paresthesias of the bilateral arms/legs. She takes Norco as prescribed by her pain management specialist in [place], [place], but states that the pain is still a 7/10 at rest constantly. She requests MRI of the thoracic spine.     Weight Loss: No changes in management were made at the last visit. Patient reports weight loss of almost 100 lbs since gastric bypass surgery on 03/04/17 at [facility] by Dr. Vernard Gambles. Symptoms include anorexia and decreased food intake, while symptoms do not include dysphagia, odynophagia, dysgeusia, diarrhea, or polyuria. Onset was gradual months ago. The patient describes this as unchanged. She  reports that she has hit somewhat of a plateau in her weight loss.     ROS:  Constitutional: Denies unintentional weight gain/loss, fever, chills, fatigue, tiredness  Skin: Denies skin problems or rashes, itching  Musculoskeletal: Denies unilateral muscle weakness, atrophy, joint pain/swelling  HEENT: +nasal congestion, otalgia. Denies blurred vision, vision loss, hearing loss, epistaxis, sore throat, difficulty swallowing  Cardiovascular: Denies heart valve problems/murmurs, irregular heartbeat, angina/chest pain, swelling in feet, swelling in hands, shortness of breath lying flat, short of breath climbing stairs, palpitations  Respiratory: Denies shortness of breath, productive cough, hemoptysis, wheezing  Gastrointestinal: +nausea. Denies abdominal pain, diarrhea, constipation, vomiting blood, black or tarry stools, GERD  Genitourinary: Denies hematuria, change in urinary stream, urinary frequency, incontinence, dysuria, nocturia, sexual dysfunction  Neurologic: Denies headache, seizures, stroke, dizziness, loss of consciousness, paresthesias, numbness  Psychiatric: Denies anxiety, depression, memory loss, suicidal/homicidal thoughts, insomnia  Endocrine: Denies polydipsia, polyuria, thyroid problems, heat intolerance, cold intolerance    Past medical history:  Past Medical History:   Diagnosis Date   . Anxiety    . BMI 35.0-35.9,adult    . Carotid stenosis    . Chronic back pain    . Coronary artery disease    . DDD (degenerative disc disease), lumbar     "entire spine"   . Ear piercing    . GERD (gastroesophageal reflux disease)    . H/O complete eye exam     2 years, Dr. [name], at [facility]   . History of dental examination     dentures upper and lower   .  HTN    . Hyperlipidemia    . Neck problem     herniated cervical disc   . Obesity    . Stroke (CMS [facility])     2002   . Tattoo     Left breast, Right shoulder   . Type 2 diabetes mellitus     Dx 1999 average fasting 70-89     Past surgical history:  Past Surgical  History:   Procedure Laterality Date   . BYPASS GRAFT  2012    cardiac, 2 vessel, Dr. [name], [facility]   . ENDOMETRIAL ABLATION  1998   . HX ANKLE FRACTURE TX      Right, Doran Durand procedure   . HX CATARACT REMOVAL      r and l eyes with implants   . HX CESAREAN SECTION  06/20/2000    x3, 11/21/86, 11/10/84   . HX CHOLECYSTECTOMY  1988   . HX CORONARY STENT PLACEMENT  2012    x4 stents   . HX GASTRIC SLEEVE  02/2017    [place], [place], Dr. Pearlie Oyster   . HX TONSILLECTOMY      as child     Medications:  Current Outpatient Medications   Medication Sig   . ACCU-CHEK AVIVA PLUS TEST STRP Strip 1 Strip by Subcutaneous route Three times a day as needed   . BYDUREON 2 mg Subcutaneous 2 mg by Subcutaneous route Every 7 days   . clopidogrel (PLAVIX) 75 mg Oral Tablet Take 1 Tab (75 mg total) by mouth Once a day   . Dextromethorphan-Guaifenesin 15-100 mg/5 mL Oral Syrup Take 10 mL by mouth Every 6 hours as needed for Cough (Patient not taking: Reported on 02/15/2018)   . diclofenac sodium (VOLTAREN) 1 % Gel 2 g by Apply Topically route Four times a day - before meals and bedtime   . flash glucose scanning reader (FREESTYLE LIBRE 14 DAY READER) Does not apply Misc For daily use for 14 days.   . flash glucose sensor (FREESTYLE LIBRE 14 DAY SENSOR) Does not apply Kit For daily use for 14 days.   . fluticasone (FLONASE) 50 mcg/actuation Nasal Spray, Suspension 2 Sprays by Each Nostril route Once a day   . furosemide (LASIX) 40 mg Oral Tablet Take 1 Tab (40 mg total) by mouth Once a day   . HYDROcodone-acetaminophen (NORCO) 10-325 mg Oral Tablet Take 1 Tab by mouth Every 4 hours as needed   . Ibuprofen (MOTRIN) 800 mg Oral Tablet Take 800 mg by mouth Three times a day as needed for Pain   . lisinopril (PRINIVIL) 2.5 mg Oral Tablet take 1 tablet by mouth once daily   . loratadine (CLARITIN) 10 mg Oral Tablet Take 1 Tab (10 mg total) by mouth Once a day   . metFORMIN (GLUCOPHAGE) 500 mg Oral Tablet Take 1 Tab (500 mg total) by  mouth Twice daily with food   . metoprolol succinate (TOPROL-XL) 25 mg Oral Tablet Sustained Release 24 hr Take 0.5 Tabs (12.5 mg total) by mouth Once a day   . niacin (NIASPAN) 500 mg Oral Tablet Sustained Release Take 1 Tab (500 mg total) by mouth Once a day   . nitroGLYCERIN (NITROSTAT) 0.4 mg Sublingual Tablet, Sublingual 1 Tab (0.4 mg total) by Sublingual route Every 5 minutes as needed for Chest pain for 3 doses over 15 minutes   . omeprazole (PRILOSEC) 40 mg Oral Capsule, Delayed Release(E.C.) take 1 capsule by mouth once daily   . ondansetron (  ZOFRAN ODT) 8 mg Oral Tablet, Rapid Dissolve Take 1 Tab (8 mg total) by mouth Every 8 hours as needed for nausea/vomiting   . PRENATAL PLUS, CALCIUM CARB, 27 mg iron- 1 mg Oral Tablet Take 1 Tab by mouth Once a day   . raNITIdine (ZANTAC) 150 mg Oral Tablet Take 1 Tab (150 mg total) by mouth Twice daily   . rosuvastatin (CRESTOR) 20 mg Oral Tablet Take 1 Tab (20 mg total) by mouth Once a day   . VENTOLIN HFA 90 mcg/actuation Inhalation HFA Aerosol Inhaler Take 1-2 Puffs by inhalation Every 6 hours as needed (wheezing, cough)     Allergies:  Allergies   Allergen Reactions   . No Known Drug Allergies      Family history:  Family Medical History:     Problem Relation (Age of Onset)    Atrial fibrillation Sister    Colon Cancer Father    Diabetes Mother    Heart Attack Mother    Heart Disease Sister, Maternal Grandfather    Hypertension Mother    Lung Cancer Maternal Grandfather        Social history:  Social History     Socioeconomic History   . Marital status: Married     Spouse name: Monique [name]   . Number of children: 3   . Years of education: completed 11th grade   . Highest education level: Not on file   Social Needs   . Financial resource strain: Not on file   . Food insecurity - worry: Not on file   . Food insecurity - inability: Not on file   . Transportation needs - medical: Not on file   . Transportation needs - non-medical: Not on file   Occupational History   .  Occupation: disabled   Tobacco Use   . Smoking status: Current Every Day Smoker     Packs/day: 0.25     Years: 20.00     Pack years: 5.00     Types: Cigarettes     Last attempt to quit: 03/30/2011     Years since quitting: 6.8   . Smokeless tobacco: Never Used   Substance and Sexual Activity   . Alcohol use: No   . Drug use: No   . Sexual activity: Yes     Partners: Male   Other Topics Concern   . Abuse/Domestic Violence Not Asked   . Breast Self Exam Not Asked   . Caffeine Concern Not Asked   . Calcium intake adequate Not Asked   . Computer Use Not Asked   . Drives Not Asked   . Exercise Concern Not Asked   . Helmet Use Not Asked   . Seat Belt Not Asked   . Special Diet Not Asked   . Sunscreen used Not Asked   . Uses Cane Not Asked   . Uses walker Not Asked   . Uses wheelchair Not Asked   . Right hand dominant Not Asked   . Left hand dominant Not Asked   . Ambidextrous Not Asked   . Shift Work Not Asked   . Unusual Sleep-Wake Schedule Not Asked   . Ability to Walk 1 Flight of Steps without SOB/CP Not Asked   . Routine Exercise No   . Ability to Walk 2 Flight of Steps without SOB/CP Yes   . Unable to Ambulate Not Asked   . Total Care Not Asked   . Ability To Do Own ADL's Not Asked   .  Uses Walker Not Asked   . Other Activity Level Not Asked   . Uses Cane Not Asked   Social History Narrative    Living situation: lives at home with husband, Monique [name], and daughter.     Nutrition:  Eats small meals d/t sleeve surgery.     Caffeine use: none, decaf coffee    Exercise: stays busy around house.     Seatbelt use: yes    Fire extinguishers in the home: yes    Smoke alarms in the home: yes    Carbon monoxide detectors in the home: yes    Monique [name] exposure:        [place] would like for husband, Monique [name],  to speak for her in the event she would become incapacitated.    Full code.      Vitals:    02/15/18 1016   BP: 112/60   Pulse: 67   Temp: 35.7 C (96.2 F)   SpO2: 97%   Weight: 94.3 kg (208 lb)   Height: 1.676 m (5' 6" )   BMI:  33.64     Physical Examination:  GENERAL:   Pt is a pleasant, well-nourished, well-developed 52 y.o. female who is in NAD. Appears stated age  5:  head normocephalic, symmetrical facies. EOM intact b/l. PERRLA. Sclera non-icteric, non-injected. No rhinorrhea. Oropharyngeal mucous membranes are moist  w/o erythema/exudates, upper eyelid w/Xythoma-lipid plaques. Stable. Bilateral TM's are retracted and without effusion or erythema.   NECK: supple. No masses, lymphadenopathy, JVD, or carotid bruits on exam. Trachea midline, no thyromegaly   CV:  S1-S2 No murmurs, rubs, or  gallops.   LUNGS: CTAB, No rhonchi, rales, wheezes.   GI: (+) BS in all 4 quadrants. soft, NT/ND.   No rigidity/ guarding/ rebound. No organomegaly/masses, or abdominal bruits  MSK: Thoracic spine: Tenderness to palpation of the lower thoracic and upper lumbar spine with positive Jump sign. No obvious abnormal curvature or step-off deformities. No radicular symptoms down the bilateral arms or around the abdomen.   Spontaneous normal AROM of major joints as observed during exam.   No joint effusions/ swelling/ deformities.    No BLE edema, clubbing, or cyanosis.   2+ radial pulse present b/l.   + 2 reflexes Brachioradialis, patellar bilaterally.   Normal gait  SKIN: No significant lesions, rashes, ecchymoses noted.   Note:midline chest scar consistent w/ CABG, well-healed and well-approximated.   Approximately 2 mm raised lesion with central pore (blackened) noted in abdominal skin fold. No purulence able to be expressed. No surrounding erythema or induration.   NEURO: AAOx4, CN grossly intact. Sensation intact b/l. No focal deficits.  PSYCH: Mood, behavior and affect normal w/ Intact judgement & insight     Diabetes Monitors  A1C: 10.1  A1C Date: 02/15/2018          Urine Microalbumin: 0.54   Microalbumin Date: 09/15/2017    Last Lipid Panel  (Last result in the past 2 years)      Cholesterol   HDL   LDL   Direct LDL   Triglycerides       10/23/16 1216 167  Comment:  Total Cholesterol Classification:     <200        Desirable        200-239     Borderline High        > or = 240  High 50  Comment:  HDL Cholesterol Classification:          <40  Low        > or = 60  High   84  Comment:  LDL Cholesterol Classification:         <100     Optimal       100-129  Near or Above Optimal       130-159  Borderline High       160-189  High       >190     Very High   165  Comment:  Triglyceride Classification:            <150    Normal       150-199 Borderline-High       200-499 High       >500    Very High        Retinal Exam Date: Not Found  Last diabetic foot exam: Not Found      Visit Diagnosis  Type 2 diabetes mellitus with complication, without long-term current use of insulin (CMS HCC)  Chronic, worsened control. HbA1C worsened from 9.2% to 10.1%.   Continue behavioral efforts including a diabetic friendly diet.  Continue Bydureon 2 mg SQ weekly.   She was previously taken off of oral medications due to gastric bypass surgery and improved control.  Ordered FreeStyle Libre monitor for her to use.   Restart metformin. She will monitor for GI side effects.   If she is unable to tolerate, will consider Jardiance or return to [facility].   Return in 1 month for close follow-up. Suspect diet/prednisone is a cause of her issues.     Essential hypertension  Chronic, Controlled  Continue behavioral efforts, esp  Cardiac friendly diet,   < 2 gm salt q day.   Exercise 20 min q day most days of the week.  Continue BB - Toprol XL 25 mg daily.   Continue ACEI - lisinopril 2.5 mg daily.   Labs ordered.     Hyperlipidemia, unspecified hyperlipidemia type  Lab Results   Component Value Date    CHOLESTEROL 167 10/23/2016    HDLCHOL 50 10/23/2016    LDLCHOL 84 10/23/2016    TRIG 165 10/23/2016   Repeat fasting labs.   Continue dietary and lifestyle modifications.   Continue Crestor 20 mg daily. .  Continue niacin 500 mg daily.     Eustachian tube dysfunction,  bilateral  Likely due to recent viral illness.   Start oral antihistamine - Claritin 10 mg daily.   Start nasal steroid - Flonase 2 sprays daily.   Continue gently trying to "pop" ears as many times of the days as possible.     Vitamin D deficiency  Noted on previous labs.   Will recheck.   Not currently on supplementation.     Chronic bilateral thoracic back pain  Patient denies known injury.   Worsened recently.   Follows with pain management.   MRI ordered per her request for further evaluation.   Encouraged continued weight loss.     BMI 33.0-33.9, adult  BMI addressed: Advised on diet, weight loss, and exercise to reduce above normal BMI.   Continue dietary modifications and exercise.   S/P bariatric surgery with approximately 100 lbs weight loss.     Orders Placed This Encounter   . LIPID PANEL   . CBC   . COMPREHENSIVE METABOLIC PNL, FASTING   . VITAMIN D 25, TOTAL   . POCT HGB A1C   . BYDUREON 2 mg Subcutaneous   . metFORMIN (GLUCOPHAGE)  500 mg Oral Tablet   . loratadine (CLARITIN) 10 mg Oral Tablet   . fluticasone (FLONASE) 50 mcg/actuation Nasal Spray, Suspension   . flash glucose scanning reader (FREESTYLE LIBRE 14 DAY READER) Does not apply Misc   . flash glucose sensor (FREESTYLE LIBRE 14 DAY SENSOR) Does not apply Kit     Return in about 1 month (around 03/18/2018), or if symptoms worsen or fail to improve, for DMII.    Chryl Heck, PA-C  02/15/2018, 14:30  Electronically signed by Chryl Heck, PA-C    Cherly Beach, MD  02/16/2018, 08:29    I have personally interviewed and examined this patient.  I have reviewed recent results. I have formed a plan of care and discussed this with Chryl Heck, PA-C. I have reviewed the above document with Chryl Heck, PA-C, and I am agreement w/ contents and plan.

## 2018-02-15 NOTE — Nursing Note (Signed)
02/15/18 1000   A1C   A1C 10.1   Initials sjm

## 2018-02-16 ENCOUNTER — Encounter: Payer: Self-pay | Admitting: Gastroenterology

## 2018-02-18 NOTE — Patient Instructions (Signed)
Ann [name]  02/18/2018     @PREFPERIOPPHARMACY @   Your procedure is scheduled on  03/02/2018   Report to [facility] at  615   A.M.  Call this number if you have problems the morning of surgery:  [phone]   Remember:  Do not eat food or drink liquids after midnight.  Take these medicines the morning of surgery with A SIP OF WATER  Xanax, dexilant.   Do not wear jewelry, make-up or nail polish.  Do not wear lotions, powders, or perfumes, or deodorant.  Do not shave 48 hours prior to surgery.  Men may shave face and neck.  Do not bring valuables to the hospital.  [facility] is not responsible for any belongings or valuables.  Contacts, dentures or bridgework may not be worn into surgery.  Leave your suitcase in the car.  After surgery it may be brought to your room.  For patients admitted to the hospital, discharge time will be determined by your treatment team.  Patients discharged the day of surgery will not be allowed to drive home.   Name and phone number of your driver:   family Special instructions:  Follow the diet and prep instructions given to you by Dr Evelina DunField's office.  Please read over the following fact sheets that you were given. Anesthesia Post-op Instructions and Care and Recovery After Surgery       Esophagogastroduodenoscopy Esophagogastroduodenoscopy (EGD) is a procedure to examine the lining of the esophagus, stomach, and first part of the small intestine (duodenum). This procedure is done to check for problems such as inflammation, bleeding, ulcers, or growths. During this procedure, a long, flexible, lighted tube with a camera attached (endoscope) is inserted down the throat. Tell a health care provider about:  Any allergies you have.  All medicines you are taking, including vitamins, herbs, eye drops, creams, and over-the-counter medicines.  Any problems you or family members have had with anesthetic medicines.  Any  blood disorders you have.  Any surgeries you have had.  Any medical conditions you have.  Whether you are pregnant or may be pregnant. What are the risks? Generally, this is a safe procedure. However, problems may occur, including:  Infection.  Bleeding.  A tear (perforation) in the esophagus, stomach, or duodenum.  Trouble breathing.  Excessive sweating.  Spasms of the larynx.  A slowed heartbeat.  Low blood pressure.  What happens before the procedure?  Follow instructions from your health care provider about eating or drinking restrictions.  Ask your health care provider about: ? Changing or stopping your regular medicines. This is especially important if you are taking diabetes medicines or blood thinners. ? Taking medicines such as aspirin and ibuprofen. These medicines can thin your blood. Do not take these medicines before your procedure if your health care provider instructs you not to.  Plan to have someone take you home after the procedure.  If you wear dentures, be ready to remove them before the procedure. What happens during the procedure?  To reduce your risk of infection, your health care team will wash or sanitize their hands.  An IV tube will be put in a vein in your hand or arm. You will get medicines and fluids through this tube.  You will be given one or more of the following: ? A medicine to help you relax (sedative). ? A medicine to numb the area (local anesthetic). This medicine  may be sprayed into your throat. It will make you feel more comfortable and keep you from gagging or coughing during the procedure. ? A medicine for pain.  A mouth guard may be placed in your mouth to protect your teeth and to keep you from biting on the endoscope.  You will be asked to lie on your left side.  The endoscope will be lowered down your throat into your esophagus, stomach, and duodenum.  Air will be put into the endoscope. This will help your health  care provider see better.  The lining of your esophagus, stomach, and duodenum will be examined.  Your health care provider may: ? Take a tissue sample so it can be looked at in a lab (biopsy). ? Remove growths. ? Remove objects (foreign bodies) that are stuck. ? Treat any bleeding with medicines or other devices that stop tissue from bleeding. ? Widen (dilate) or stretch narrowed areas of your esophagus and stomach.  The endoscope will be taken out. The procedure may vary among health care providers and hospitals. What happens after the procedure?  Your blood pressure, heart rate, breathing rate, and blood oxygen level will be monitored often until the medicines you were given have worn off.  Do not eat or drink anything until the numbing medicine has worn off and your gag reflex has returned. This information is not intended to replace advice given to you by your health care provider. Make sure you discuss any questions you have with your health care provider. Document Released: 03/27/2005 Document Revised: 05/01/2016 Document Reviewed: 10/18/2015 Elsevier Interactive Patient Education  2018 [employer]. Esophagogastroduodenoscopy, Care After Refer to this sheet in the next few weeks. These instructions provide you with information about caring for yourself after your procedure. Your health care provider may also give you more specific instructions. Your treatment has been planned according to current medical practices, but problems sometimes occur. Call your health care provider if you have any problems or questions after your procedure. What can I expect after the procedure? After the procedure, it is common to have:  A sore throat.  Nausea.  Bloating.  Dizziness.  Fatigue.  Follow these instructions at home:  Do not eat or drink anything until the numbing medicine (local anesthetic) has worn off and your gag reflex has returned. You will know that the local anesthetic has  worn off when you can swallow comfortably.  Do not drive for 24 hours if you received a medicine to help you relax (sedative).  If your health care provider took a tissue sample for testing during the procedure, make sure to get your test results. This is your responsibility. Ask your health care provider or the department performing the test when your results will be ready.  Keep all follow-up visits as told by your health care provider. This is important. Contact a health care provider if:  You cannot stop coughing.  You are not urinating.  You are urinating less than usual. Get help right away if:  You have trouble swallowing.  You cannot eat or drink.  You have throat or chest pain that gets worse.  You are dizzy or light-headed.  You faint.  You have nausea or vomiting.  You have chills.  You have a fever.  You have severe abdominal pain.  You have black, tarry, or bloody stools. This information is not intended to replace advice given to you by your health care provider. Make sure you discuss any questions you have  with your health care provider. Document Released: 11/10/2012 Document Revised: 05/01/2016 Document Reviewed: 10/18/2015 Elsevier Interactive Patient Education  2018 [employer].  Esophageal Dilatation Esophageal dilatation is a procedure to open a blocked or narrowed part of the esophagus. The esophagus is the long tube in your throat that carries food and liquid from your mouth to your stomach. The procedure is also called esophageal dilation. You may need this procedure if you have a buildup of scar tissue in your esophagus that makes it difficult, painful, or even impossible to swallow. This can be caused by gastroesophageal reflux disease (GERD). In rare cases, people need this procedure because they have cancer of the esophagus or a problem with the way food moves through the esophagus. Sometimes you may need to have another dilatation to enlarge the  opening of the esophagus gradually. Tell a health care provider about:  Any allergies you have.  All medicines you are taking, including vitamins, herbs, eye drops, creams, and over-the-counter medicines.  Any problems you or family members have had with anesthetic medicines.  Any blood disorders you have.  Any surgeries you have had.  Any medical conditions you have.  Any antibiotic medicines you are required to take before dental procedures. What are the risks? Generally, this is a safe procedure. However, problems can occur and include:  Bleeding from a tear in the lining of the esophagus.  A hole (perforation) in the esophagus.  What happens before the procedure?  Do not eat or drink anything after midnight on the night before the procedure or as directed by your health care provider.  Ask your health care provider about changing or stopping your regular medicines. This is especially important if you are taking diabetes medicines or blood thinners.  Plan to have someone take you home after the procedure. What happens during the procedure?  You will be given a medicine that makes you relaxed and sleepy (sedative).  A medicine may be sprayed or gargled to numb the back of the throat.  Your health care provider can use various instruments to do an esophageal dilatation. During the procedure, the instrument used will be placed in your mouth and passed down into your esophagus. Options include: ? Simple dilators. This instrument is carefully placed in the esophagus to stretch it. ? Guided wire bougies. In this method, a flexible tube (endoscope) is used to insert a wire into the esophagus. The dilator is passed over this wire to enlarge the esophagus. Then the wire is removed. ? Balloon dilators. An endoscope with a small balloon at the end is passed down into the esophagus. Inflating the balloon gently stretches the esophagus and opens it up. What happens after the  procedure?  Your blood pressure, heart rate, breathing rate, and blood oxygen level will be monitored often until the medicines you were given have worn off.  Your throat may feel slightly sore and will probably still feel numb. This will improve slowly over time.  You will not be allowed to eat or drink until the throat numbness has resolved.  If this is a same-day procedure, you may be allowed to go home once you have been able to drink, urinate, and sit on the edge of the bed without nausea or dizziness.  If this is a same-day procedure, you should have a friend or family member with you for the next 24 hours after the procedure. This information is not intended to replace advice given to you by your health care provider.  Make sure you discuss any questions you have with your health care provider. Document Released: 01/15/2006 Document Revised: 05/01/2016 Document Reviewed: 04/05/2014 Elsevier Interactive Patient Education  2018 [place] Anesthesia is a term that refers to techniques, procedures, and medicines that help a person stay safe and comfortable during a medical procedure. Monitored anesthesia care, or sedation, is one type of anesthesia. Your anesthesia specialist may recommend sedation if you will be having a procedure that does not require you to be unconscious, such as:  Cataract surgery.  A dental procedure.  A biopsy.  A colonoscopy.  During the procedure, you may receive a medicine to help you relax (sedative). There are three levels of sedation:  Mild sedation. At this level, you may feel awake and relaxed. You will be able to follow directions.  Moderate sedation. At this level, you will be sleepy. You may not remember the procedure.  Deep sedation. At this level, you will be asleep. You will not remember the procedure.  The more medicine you are given, the deeper your level of sedation will be. Depending on how you respond to  the procedure, the anesthesia specialist may change your level of sedation or the type of anesthesia to fit your needs. An anesthesia specialist will monitor you closely during the procedure. Let your health care provider know about:  Any allergies you have.  All medicines you are taking, including vitamins, herbs, eye drops, creams, and over-the-counter medicines.  Any use of steroids (by mouth or as a cream).  Any problems you or family members have had with sedatives and anesthetic medicines.  Any blood disorders you have.  Any surgeries you have had.  Any medical conditions you have, such as sleep apnea.  Whether you are pregnant or may be pregnant.  Any use of cigarettes, alcohol, or street drugs. What are the risks? Generally, this is a safe procedure. However, problems may occur, including:  Getting too much medicine (oversedation).  Nausea.  Allergic reaction to medicines.  Trouble breathing. If this happens, a breathing tube may be used to help with breathing. It will be removed when you are awake and breathing on your own.  Heart trouble.  Lung trouble.  Before the procedure Staying hydrated Follow instructions from your health care provider about hydration, which may include:  Up to 2 hours before the procedure - you may continue to drink clear liquids, such as water, clear fruit juice, black coffee, and plain tea.  Eating and drinking restrictions Follow instructions from your health care provider about eating and drinking, which may include:  8 hours before the procedure - stop eating heavy meals or foods such as meat, fried foods, or fatty foods.  6 hours before the procedure - stop eating light meals or foods, such as toast or cereal.  6 hours before the procedure - stop drinking milk or drinks that contain milk.  2 hours before the procedure - stop drinking clear liquids.  Medicines Ask your health care provider about:  Changing or stopping your  regular medicines. This is especially important if you are taking diabetes medicines or blood thinners.  Taking medicines such as aspirin and ibuprofen. These medicines can thin your blood. Do not take these medicines before your procedure if your health care provider instructs you not to.  Tests and exams  You will have a physical exam.  You may have blood tests done to show: ? How well your kidneys and liver are working. ?  How well your blood can clot.  General instructions  Plan to have someone take you home from the hospital or clinic.  If you will be going home right after the procedure, plan to have someone with you for 24 hours.  What happens during the procedure?  Your blood pressure, heart rate, breathing, level of pain and overall condition will be monitored.  An IV tube will be inserted into one of your veins.  Your anesthesia specialist will give you medicines as needed to keep you comfortable during the procedure. This may mean changing the level of sedation.  The procedure will be performed. After the procedure  Your blood pressure, heart rate, breathing rate, and blood oxygen level will be monitored until the medicines you were given have worn off.  Do not drive for 24 hours if you received a sedative.  You may: ? Feel sleepy, clumsy, or nauseous. ? Feel forgetful about what happened after the procedure. ? Have a sore throat if you had a breathing tube during the procedure. ? Vomit. This information is not intended to replace advice given to you by your health care provider. Make sure you discuss any questions you have with your health care provider. Document Released: 08/20/2005 Document Revised: 05/02/2016 Document Reviewed: 03/16/2016 Elsevier Interactive Patient Education  2018 [place], Care After These instructions provide you with information about caring for yourself after your procedure. Your health care provider may  also give you more specific instructions. Your treatment has been planned according to current medical practices, but problems sometimes occur. Call your health care provider if you have any problems or questions after your procedure. What can I expect after the procedure? After your procedure, it is common to:  Feel sleepy for several hours.  Feel clumsy and have poor balance for several hours.  Feel forgetful about what happened after the procedure.  Have poor judgment for several hours.  Feel nauseous or vomit.  Have a sore throat if you had a breathing tube during the procedure.  Follow these instructions at home: For at least 24 hours after the procedure:   Do not: ? Participate in activities in which you could fall or become injured. ? Drive. ? Use heavy machinery. ? Drink alcohol. ? Take sleeping pills or medicines that cause drowsiness. ? Make important decisions or sign legal documents. ? Take care of children on your own.  Rest. Eating and drinking  Follow the diet that is recommended by your health care provider.  If you vomit, drink water, juice, or soup when you can drink without vomiting.  Make sure you have little or no nausea before eating solid foods. General instructions  Have a responsible adult stay with you until you are awake and alert.  Take over-the-counter and prescription medicines only as told by your health care provider.  If you smoke, do not smoke without supervision.  Keep all follow-up visits as told by your health care provider. This is important. Contact a health care provider if:  You keep feeling nauseous or you keep vomiting.  You feel light-headed.  You develop a rash.  You have a fever. Get help right away if:  You have trouble breathing. This information is not intended to replace advice given to you by your health care provider. Make sure you discuss any questions you have with your health care provider. Document  Released: 03/16/2016 Document Revised: 07/16/2016 Document Reviewed: 03/16/2016 Elsevier Interactive Patient Education  [employer].

## 2018-02-19 ENCOUNTER — Other Ambulatory Visit (HOSPITAL_BASED_OUTPATIENT_CLINIC_OR_DEPARTMENT_OTHER): Payer: Self-pay | Admitting: PHYSICIAN ASSISTANT

## 2018-02-19 DIAGNOSIS — G8929 Other chronic pain: Principal | ICD-10-CM

## 2018-02-19 DIAGNOSIS — M546 Pain in thoracic spine: Secondary | ICD-10-CM

## 2018-02-23 ENCOUNTER — Other Ambulatory Visit: Payer: Self-pay

## 2018-02-23 ENCOUNTER — Encounter (HOSPITAL_COMMUNITY): Payer: Self-pay

## 2018-02-23 ENCOUNTER — Encounter (HOSPITAL_COMMUNITY)
Admission: RE | Admit: 2018-02-23 | Discharge: 2018-02-23 | Disposition: A | Discharge status: Home / Self Care | Payer: BLUE CROSS/BLUE SHIELD | Source: Ambulatory Visit | Attending: Gastroenterology | Admitting: Gastroenterology

## 2018-02-23 ENCOUNTER — Other Ambulatory Visit (HOSPITAL_COMMUNITY): Payer: Self-pay

## 2018-02-23 DIAGNOSIS — R9431 Abnormal electrocardiogram [ECG] [EKG]: Secondary | ICD-10-CM | POA: Insufficient documentation

## 2018-02-23 DIAGNOSIS — Z01812 Encounter for preprocedural laboratory examination: Secondary | ICD-10-CM | POA: Insufficient documentation

## 2018-02-23 DIAGNOSIS — Z01810 Encounter for preprocedural cardiovascular examination: Secondary | ICD-10-CM | POA: Insufficient documentation

## 2018-02-23 HISTORY — DX: Major depressive disorder, single episode, unspecified: F32.9

## 2018-02-23 HISTORY — DX: Anxiety disorder, unspecified: F41.9

## 2018-02-23 HISTORY — DX: Gastro-esophageal reflux disease without esophagitis: K21.9

## 2018-02-23 HISTORY — DX: Depression, unspecified: F32.A

## 2018-02-23 HISTORY — DX: Other complications of anesthesia, initial encounter: T88.59XA

## 2018-02-23 HISTORY — DX: Adverse effect of unspecified anesthetic, initial encounter: T41.45XA

## 2018-02-23 LAB — CBC
HEMATOCRIT: 39.8 % (ref 36.0–46.0)
HEMOGLOBIN: 13.1 g/dL (ref 12.0–15.0)
MCH: 28.2 pg (ref 26.0–34.0)
MCHC: 32.9 g/dL (ref 30.0–36.0)
MCV: 85.8 fL (ref 78.0–100.0)
PLATELETS: 202 10*3/uL (ref 150–400)
RBC: 4.64 MIL/uL (ref 3.87–5.11)
RDW: 13.1 % (ref 11.5–15.5)
WBC: 6.2 10*3/uL (ref 4.0–10.5)

## 2018-02-23 LAB — BASIC METABOLIC PANEL
ANION GAP: 10 (ref 5–15)
BUN: 12 mg/dL (ref 6–20)
CHLORIDE: 106 mmol/L (ref 101–111)
CO2: 26 mmol/L (ref 22–32)
Calcium: 9.2 mg/dL (ref 8.9–10.3)
Creatinine, Ser: 0.68 mg/dL (ref 0.44–1.00)
GFR calc Af Amer: >60 mL/min (ref >60)
GLUCOSE: 88 mg/dL (ref 65–99)
POTASSIUM: 3.8 mmol/L (ref 3.5–5.1)
Sodium: 142 mmol/L (ref 135–145)

## 2018-02-24 ENCOUNTER — Other Ambulatory Visit (HOSPITAL_BASED_OUTPATIENT_CLINIC_OR_DEPARTMENT_OTHER): Payer: Self-pay

## 2018-02-25 ENCOUNTER — Other Ambulatory Visit (HOSPITAL_BASED_OUTPATIENT_CLINIC_OR_DEPARTMENT_OTHER): Payer: Self-pay | Admitting: PHYSICIAN ASSISTANT

## 2018-02-25 DIAGNOSIS — M546 Pain in thoracic spine: Secondary | ICD-10-CM

## 2018-02-25 DIAGNOSIS — G8929 Other chronic pain: Secondary | ICD-10-CM

## 2018-02-26 ENCOUNTER — Other Ambulatory Visit (HOSPITAL_BASED_OUTPATIENT_CLINIC_OR_DEPARTMENT_OTHER): Payer: Self-pay | Admitting: Family Medicine

## 2018-02-26 DIAGNOSIS — M546 Pain in thoracic spine: Secondary | ICD-10-CM

## 2018-03-02 ENCOUNTER — Telehealth: Payer: Self-pay | Admitting: Gastroenterology

## 2018-03-02 ENCOUNTER — Encounter (HOSPITAL_COMMUNITY): Payer: Self-pay | Admitting: *Deleted

## 2018-03-02 ENCOUNTER — Encounter (HOSPITAL_COMMUNITY)
Admission: RE | Disposition: A | Discharge status: Home / Self Care | Payer: Self-pay | Source: Ambulatory Visit | Attending: Gastroenterology

## 2018-03-02 ENCOUNTER — Ambulatory Visit (HOSPITAL_COMMUNITY)
Admission: RE | Admit: 2018-03-02 | Discharge: 2018-03-02 | Disposition: A | Discharge status: Home / Self Care | Payer: BLUE CROSS/BLUE SHIELD | Source: Ambulatory Visit | Attending: Gastroenterology | Admitting: Gastroenterology

## 2018-03-02 ENCOUNTER — Ambulatory Visit (HOSPITAL_COMMUNITY): Payer: BLUE CROSS/BLUE SHIELD | Admitting: Anesthesiology

## 2018-03-02 DIAGNOSIS — F419 Anxiety disorder, unspecified: Secondary | ICD-10-CM | POA: Insufficient documentation

## 2018-03-02 DIAGNOSIS — K648 Other hemorrhoids: Secondary | ICD-10-CM | POA: Insufficient documentation

## 2018-03-02 DIAGNOSIS — K621 Rectal polyp: Secondary | ICD-10-CM

## 2018-03-02 DIAGNOSIS — Z79899 Other long term (current) drug therapy: Secondary | ICD-10-CM | POA: Diagnosis not present

## 2018-03-02 DIAGNOSIS — R1310 Dysphagia, unspecified: Secondary | ICD-10-CM

## 2018-03-02 DIAGNOSIS — Z87891 Personal history of nicotine dependence: Secondary | ICD-10-CM | POA: Diagnosis not present

## 2018-03-02 DIAGNOSIS — M1990 Unspecified osteoarthritis, unspecified site: Secondary | ICD-10-CM | POA: Insufficient documentation

## 2018-03-02 DIAGNOSIS — Q438 Other specified congenital malformations of intestine: Secondary | ICD-10-CM | POA: Diagnosis not present

## 2018-03-02 DIAGNOSIS — K219 Gastro-esophageal reflux disease without esophagitis: Secondary | ICD-10-CM | POA: Insufficient documentation

## 2018-03-02 DIAGNOSIS — F329 Major depressive disorder, single episode, unspecified: Secondary | ICD-10-CM | POA: Insufficient documentation

## 2018-03-02 DIAGNOSIS — K5730 Diverticulosis of large intestine without perforation or abscess without bleeding: Secondary | ICD-10-CM | POA: Insufficient documentation

## 2018-03-02 DIAGNOSIS — T39395A Adverse effect of other nonsteroidal anti-inflammatory drugs [NSAID], initial encounter: Secondary | ICD-10-CM | POA: Insufficient documentation

## 2018-03-02 DIAGNOSIS — R109 Unspecified abdominal pain: Secondary | ICD-10-CM | POA: Insufficient documentation

## 2018-03-02 DIAGNOSIS — K449 Diaphragmatic hernia without obstruction or gangrene: Secondary | ICD-10-CM | POA: Diagnosis not present

## 2018-03-02 DIAGNOSIS — K222 Esophageal obstruction: Secondary | ICD-10-CM

## 2018-03-02 DIAGNOSIS — K921 Melena: Secondary | ICD-10-CM

## 2018-03-02 DIAGNOSIS — R197 Diarrhea, unspecified: Secondary | ICD-10-CM | POA: Diagnosis not present

## 2018-03-02 DIAGNOSIS — K319 Disease of stomach and duodenum, unspecified: Secondary | ICD-10-CM | POA: Insufficient documentation

## 2018-03-02 DIAGNOSIS — Z888 Allergy status to other drugs, medicaments and biological substances status: Secondary | ICD-10-CM | POA: Insufficient documentation

## 2018-03-02 DIAGNOSIS — K2970 Gastritis, unspecified, without bleeding: Secondary | ICD-10-CM

## 2018-03-02 DIAGNOSIS — Z880 Allergy status to penicillin: Secondary | ICD-10-CM | POA: Insufficient documentation

## 2018-03-02 HISTORY — PX: COLONOSCOPY WITH PROPOFOL: SHX5780

## 2018-03-02 HISTORY — PX: SAVORY DILATION: SHX5439

## 2018-03-02 HISTORY — PX: ESOPHAGOGASTRODUODENOSCOPY (EGD) WITH PROPOFOL: SHX5813

## 2018-03-02 SURGERY — COLONOSCOPY WITH PROPOFOL
Anesthesia: Monitor Anesthesia Care

## 2018-03-02 MED ORDER — MIDAZOLAM HCL 5 MG/5ML IJ SOLN
INTRAMUSCULAR | Status: DC | PRN
  Administered 2018-03-02: 2 mg via INTRAVENOUS

## 2018-03-02 MED ORDER — LACTATED RINGERS IV SOLN
INTRAVENOUS | Status: DC
  Administered 2018-03-02: 07:00:00 via INTRAVENOUS

## 2018-03-02 MED ORDER — MIDAZOLAM HCL 2 MG/2ML IJ SOLN
1.0000 mg | INTRAMUSCULAR | Status: AC
  Administered 2018-03-02: 2 mg via INTRAVENOUS

## 2018-03-02 MED ORDER — LIDOCAINE VISCOUS 2 % MT SOLN
OROMUCOSAL | Status: AC
  Filled 2018-03-02: qty 15

## 2018-03-02 MED ORDER — FENTANYL CITRATE (PF) 100 MCG/2ML IJ SOLN
INTRAMUSCULAR | Status: AC
  Filled 2018-03-02: qty 2

## 2018-03-02 MED ORDER — FENTANYL CITRATE (PF) 100 MCG/2ML IJ SOLN
25.0000 ug | Freq: Once | INTRAMUSCULAR | Status: AC
  Administered 2018-03-02: 25 ug via INTRAVENOUS

## 2018-03-02 MED ORDER — MIDAZOLAM HCL 2 MG/2ML IJ SOLN
INTRAMUSCULAR | Status: AC
  Filled 2018-03-02: qty 2

## 2018-03-02 MED ORDER — SODIUM CHLORIDE 0.9% FLUSH
INTRAVENOUS | Status: AC
  Filled 2018-03-02: qty 10

## 2018-03-02 MED ORDER — PROPOFOL 500 MG/50ML IV EMUL
INTRAVENOUS | Status: DC | PRN
  Administered 2018-03-02: 125 ug/kg/min via INTRAVENOUS
  Administered 2018-03-02 (×2): via INTRAVENOUS

## 2018-03-02 MED ORDER — MINERAL OIL PO OIL
TOPICAL_OIL | ORAL | Status: AC
  Filled 2018-03-02: qty 30

## 2018-03-02 MED ORDER — LIDOCAINE VISCOUS 2 % MT SOLN
OROMUCOSAL | Status: DC | PRN
  Administered 2018-03-02: 1 via OROMUCOSAL

## 2018-03-02 MED ORDER — PROPOFOL 10 MG/ML IV BOLUS
INTRAVENOUS | Status: AC
  Filled 2018-03-02: qty 20

## 2018-03-02 MED ORDER — DICYCLOMINE HCL 10 MG PO CAPS: ORAL_CAPSULE | ORAL | 11 refills | Status: DC

## 2018-03-02 NOTE — Telephone Encounter (Signed)
Dr. Darrick PennaFields, please advise on where to send referral.

## 2018-03-02 NOTE — Transfer of Care (Signed)
Immediate Anesthesia Transfer of Care Note  Patient: Ann [name]  Procedure(s) Performed: COLONOSCOPY WITH PROPOFOL (N/A ) ESOPHAGOGASTRODUODENOSCOPY (EGD) WITH PROPOFOL (N/A ) SAVORY DILATION (N/A )  Patient Location: PACU  Anesthesia Type:MAC  Level of Consciousness: drowsy  Airway & Oxygen Therapy: Patient Spontanous Breathing and Patient connected to face mask oxygen  Post-op Assessment: Report given to RN and Post -op Vital signs reviewed and stable  Post vital signs: Reviewed and stable  Last Vitals:  Vitals Value Taken Time  BP    Temp    Pulse    Resp    SpO2      Last Pain:  Vitals:   03/02/18 3295  TempSrc: Oral  PainSc: 0-No pain      Patients Stated Pain Goal: 6 (18/84/16 6063)  Complications: No apparent anesthesia complications

## 2018-03-02 NOTE — Telephone Encounter (Signed)
Tried to call pt, no answer, LMOVM. 

## 2018-03-02 NOTE — Telephone Encounter (Signed)
°-   Refer to a gastroenterologist at the next available appointment TO MANAGE STENT.   (Per SF recommendations in Op note from 3/26)

## 2018-03-02 NOTE — Anesthesia Preprocedure Evaluation (Signed)
Anesthesia Evaluation  Patient identified by MRN, date of birth, ID band Patient awake    Reviewed: Allergy & Precautions, NPO status , Patient's Chart, lab work & pertinent test results  History of Anesthesia Complications (+) history of anesthetic complications ( pt states, "i have migraine and high BP after surgery.".)  Airway Mallampati: I  TM Distance: >3 FB Neck ROM: Full    Dental  (+) Teeth Intact, Dental Advisory Given, Implants,    Pulmonary former smoker,    breath sounds clear to auscultation       Cardiovascular negative cardio ROS   Rhythm:Regular Rate:Normal     Neuro/Psych  Headaches, PSYCHIATRIC DISORDERS Anxiety Depression    GI/Hepatic GERD  ,Pancreatitis   Endo/Other    Renal/GU      Musculoskeletal  (+) Arthritis ,   Abdominal   Peds  Hematology   Anesthesia Other Findings Chronic back pain  Reproductive/Obstetrics                             Anesthesia Physical Anesthesia Plan  ASA: II  Anesthesia Plan: MAC   Post-op Pain Management:    Induction: Intravenous  PONV Risk Score and Plan:   Airway Management Planned: Simple Face Mask  Additional Equipment:   Intra-op Plan:   Post-operative Plan:   Informed Consent: I have reviewed the patients History and Physical, chart, labs and discussed the procedure including the risks, benefits and alternatives for the proposed anesthesia with the patient or authorized representative who has indicated his/her understanding and acceptance.     Plan Discussed with:   Anesthesia Plan Comments:         Anesthesia Quick Evaluation

## 2018-03-02 NOTE — Discharge Instructions (Signed)
NO OBVIOUS SOURCE FOR YOUR DIARRHEA WAS IDENTIFIED. You had 2 polyps removed. 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 I STRETCHED YOUR ESOPHAGUS DUE YOUR PROBLEMS SWALLOWING. I BIOPSIED YOUR STOMACH, SMALL BOWEL, AND COLON.    DRINK WATER TO KEEP YOUR URINE LIGHT YELLOW. Do not drink SODA OR DIET SODA, AVOID HIGH FRUCTOSE CORN SYRUP. DO NOT chew SUGAR FREE GUM, OR USE ARTIFICIAL SWEETENERS. USE STEVIA AS A SWEETENER OR AGAVE NECTAR.  STRICTLY FOLLOW A HIGH FIBER/LOW FAT DIET. AVOID ITEMS THAT CAUSE BLOATING. SEE INFO BELOW.   START IMODIUM 30 MINUTES PRIOR TO BREAKFAST AND LUNCH. IT MAY CAUSE DRY MOUTH/EYES, DROWSINESS, DIFFICULTY URINATING, OR BLURRY VISION.  CHEW ONE TUMS WITH MEALS UP TO THREE TIMES A  DAY TO PREVENT DIARRHEA.  CONTINUE DEXILANT.   YOUR BIOPSY RESULTS WILL BE AVAILABLE IN 7 DAYS.   PLEASE CALL WITH QUESTIONS OR CONCERNS OR IF YOU DEVELOP CONSTIPATION.  Follow up in 4 mos.  You need to see GI docs at [facility] or DR. SWEENEY TO MANAGE YOUR BILIARY STENT LONG TERM.  Next colonoscopy in 5-10 years.   ENDOSCOPY Care After Read the instructions outlined below and refer to this sheet in the next week. These discharge instructions provide you with general information on caring for yourself after you leave the hospital. While your treatment has been planned according to the most current medical practices available, unavoidable complications occasionally occur. If you have any problems or questions after discharge, call DR. Miko Markwood, [phone].  ACTIVITY  You may resume your regular activity, but move at a slower pace for the next 24 hours.   Take frequent rest periods for the next 24 hours.   Walking will help get rid of the air and reduce the bloated feeling in your belly (abdomen).   No driving for 24 hours (because of the medicine (anesthesia) used during the test).   You may shower.   Do not sign any important legal documents or operate  any machinery for 24 hours (because of the anesthesia used during the test).    NUTRITION  Drink plenty of fluids.   You may resume your normal diet as instructed by your doctor.   Begin with a light meal and progress to your normal diet. Heavy or fried foods are harder to digest and may make you feel sick to your stomach (nauseated).   Avoid alcoholic beverages for 24 hours or as instructed.    MEDICATIONS  You may resume your normal medications.   WHAT YOU CAN EXPECT TODAY  Some feelings of bloating in the abdomen.   Passage of more gas than usual.   Spotting of blood in your stool or on the toilet paper  .  IF YOU HAD POLYPS REMOVED DURING THE ENDOSCOPY:  Eat a soft diet IF YOU HAVE NAUSEA, BLOATING, ABDOMINAL PAIN, OR VOMITING.    FINDING OUT THE RESULTS OF YOUR TEST Not all test results are available during your visit. DR. Darrick Penna WILL CALL YOU WITHIN 14 DAYS OF YOUR PROCEDUE WITH YOUR RESULTS. Do not assume everything is normal if you have not heard from DR. Aneudy Champlain, CALL HER OFFICE AT [phone].  SEEK IMMEDIATE MEDICAL ATTENTION AND CALL THE OFFICE: [phone] IF:  You have more than a spotting of blood in your stool.   Your belly is swollen (abdominal distention).   You are nauseated or vomiting.   You have a temperature over 101F.   You have abdominal pain or discomfort that is  severe or gets worse throughout the day.   High-Fiber Diet A high-fiber diet changes your normal diet to include more whole grains, legumes, fruits, and vegetables. Changes in the diet involve replacing refined carbohydrates with unrefined foods. The calorie level of the diet is essentially unchanged. The Dietary Reference Intake (recommended amount) for adult males is 38 grams per day. For adult females, it is 25 grams per day. Pregnant and lactating women should consume 28 grams of fiber per day. Fiber is the intact part of a plant that is not broken down during digestion.  Functional fiber is fiber that has been isolated from the plant to provide a beneficial effect in the body. PURPOSE  Increase stool bulk.   Ease and regulate bowel movements.   Lower cholesterol.   REDUCE RISK OF COLON CANCER  INDICATIONS THAT YOU NEED MORE FIBER  Constipation and hemorrhoids.   Uncomplicated diverticulosis (intestine condition) and irritable bowel syndrome.   Weight management.   As a protective measure against hardening of the arteries (atherosclerosis), diabetes, and cancer.   GUIDELINES FOR INCREASING FIBER IN THE DIET  Start adding fiber to the diet slowly. A gradual increase of about 5 more grams (2 slices of whole-wheat bread, 2 servings of most fruits or vegetables, or 1 bowl of high-fiber cereal) per day is best. Too rapid an increase in fiber may result in constipation, flatulence, and bloating.   Drink enough water and fluids to keep your urine clear or pale yellow. Water, juice, or caffeine-free drinks are recommended. Not drinking enough fluid may cause constipation.   Eat a variety of high-fiber foods rather than one type of fiber.   Try to increase your intake of fiber through using high-fiber foods rather than fiber pills or supplements that contain small amounts of fiber.   The goal is to change the types of food eaten. Do not supplement your present diet with high-fiber foods, but replace foods in your present diet.   INCLUDE A VARIETY OF FIBER SOURCES  Replace refined and processed grains with whole grains, canned fruits with fresh fruits, and incorporate other fiber sources. White rice, white breads, and most bakery goods contain little or no fiber.   Brown whole-grain rice, buckwheat oats, and many fruits and vegetables are all good sources of fiber. These include: broccoli, Brussels sprouts, cabbage, cauliflower, beets, sweet potatoes, white potatoes (skin on), carrots, tomatoes, eggplant, squash, berries, fresh fruits, and dried fruits.    Cereals appear to be the richest source of fiber. Cereal fiber is found in whole grains and bran. Bran is the fiber-rich outer coat of cereal grain, which is largely removed in refining. In whole-grain cereals, the bran remains. In breakfast cereals, the largest amount of fiber is found in those with "bran" in their names. The fiber content is sometimes indicated on the label.   You may need to include additional fruits and vegetables each day.   In baking, for 1 cup white flour, you may use the following substitutions:   1 cup whole-wheat flour minus 2 tablespoons.   1/2 cup white flour plus 1/2 cup whole-wheat flour.    Polyps, Colon  A polyp is extra tissue that grows inside your body. Colon polyps grow in the large intestine. The large intestine, also called the colon, is part of your digestive system. It is a long, hollow tube at the end of your digestive tract where your body makes and stores stool. Most polyps are not dangerous. They are benign. This means  they are not cancerous. But over time, some types of polyps can turn into cancer. Polyps that are smaller than a pea are usually not harmful. But larger polyps could someday become or may already be cancerous. To be safe, doctors remove all polyps and test them.    PREVENTION There is not one sure way to prevent polyps. You might be able to lower your risk of getting them if you:  Eat more fruits and vegetables and less fatty food.   Do not smoke.   Avoid alcohol.   Exercise every day.   Lose weight if you are overweight.   Eating more calcium and folate can also lower your risk of getting polyps. Some foods that are rich in calcium are milk, cheese, and broccoli. Some foods that are rich in folate are chickpeas, kidney beans, and spinach.    Hiatal Hernia A hiatal hernia occurs when a part of the stomach slides above the diaphragm. The diaphragm is the thin muscle separating the belly (abdomen) from the chest. A hiatal  hernia can be something you are born with or develop over time. Hiatal hernias may allow stomach acid to flow back into your esophagus, the tube which carries food from your mouth to your stomach. If this acid causes problems it is called GERD (gastro-esophageal reflux disease).   SYMPTOMS Common symptoms of GERD are heartburn (burning in your chest). This is worse when lying down or bending over. It may also cause belching and indigestion. Some of the things which make GERD worse are:  Increased weight pushes on stomach making acid rise more easily.   Smoking markedly increases acid production.   Alcohol decreases lower esophageal sphincter pressure (valve between stomach and esophagus), allowing acid from stomach into esophagus.   Late evening meals and going to bed with a full stomach increases pressure.   HOME CARE INSTRUCTIONS  Try to achieve and maintain an ideal body weight.   Avoid drinking alcoholic beverages.   DO NOT smokE.   Do not wear tight clothing around your chest or stomach.   Eat smaller meals and eat more frequently. This keeps your stomach from getting too full. Eat slowly.   Do not lie down for 2 or 3 hours after eating. Do not eat or drink anything 1 to 2 hours before going to bed.   Avoid caffeine beverages (colas, coffee, cocoa, tea), fatty foods, citrus fruits and all other foods and drinks that contain acid and that seem to increase the problems.   Avoid bending over, especially after eating OR STRAINING. Anything that increases the pressure in your belly increases the amount of acid that may be pushed up into your esophagus.   Gastritis  Gastritis is an inflammation (the body's way of reacting to injury and/or infection) of the stomach. It is often caused by viral or bacterial (germ) infections. It can also be caused BY ASPIRIN, BC/GOODY POWDER'S, (IBUPROFEN) MOTRIN, OR ALEVE (NAPROXEN), chemicals (including alcohol), SPICY FOODS, and medications. This  illness may be associated with generalized malaise (feeling tired, not well), UPPER ABDOMINAL STOMACH cramps, and fever. One common bacterial cause of gastritis is an organism known as H. Pylori. This can be treated with antibiotics.

## 2018-03-02 NOTE — Op Note (Signed)
[facility] Patient Name: Judi Jaffe Procedure Date: 03/02/2018 7:02 AM MRN: [id number] Date of Birth: January 23, 1966 Attending MD: Jonette Eva MD, MD CSN: [id number] Age: 52 Admit Type: Outpatient Procedure:                Colonoscopy WITH COLD FORCEPS BIOPSY/POLYPECTOMY Indications:              Abdominal pain, Clinically significant diarrhea of                            unexplained origin, Hematochezia Providers:                Jonette Eva MD, MD, Jannett Celestine, RN, Dyann Ruddle Referring MD:              Medicines:                Propofol per Anesthesia Complications:            No immediate complications. Estimated Blood Loss:     Estimated blood loss was minimal. Procedure:                Pre-Anesthesia Assessment:                           - Prior to the procedure, a History and Physical                            was performed, and patient medications and                            allergies were reviewed. The patient's tolerance of                            previous anesthesia was also reviewed. The risks                            and benefits of the procedure and the sedation                            options and risks were discussed with the patient.                            All questions were answered, and informed consent                            was obtained. Prior Anticoagulants: The patient has                            taken ibuprofen, last dose was 1 day prior to                            procedure. ASA Grade Assessment: II - A patient                            with mild systemic disease. After reviewing the  risks and benefits, the patient was deemed in                            satisfactory condition to undergo the procedure.                           After obtaining informed consent, the colonoscope                            was passed under direct vision. Throughout the                            procedure, the  patient's blood pressure, pulse, and                            oxygen saturations were monitored continuously. The                            EC-[id number] ([id number] scope was introduced through                            the anus and advanced to the 15 cm into the ileum.                            The [id number] ([id number] scope was introduced through                            the and advanced to the. The terminal ileum,                            ileocecal valve, appendiceal orifice, and rectum                            were photographed. The colonoscopy was somewhat                            difficult due to a tortuous colon. Successful                            completion of the procedure was aided by increasing                            the dose of sedation medication, straightening and                            shortening the scope to obtain bowel loop reduction                            and COLOWRAP. The patient tolerated the procedure                            fairly well. The quality of the bowel preparation  was excellent. SWITCHED TO GASTROSCOPE TO RETROFLEX                            IN RECTUM. Scope In: 7:42:41 AM Scope Out: 8:02:51 AM Scope Withdrawal Time: 0 hours 18 minutes 1 second  Total Procedure Duration: 0 hours 20 minutes 10 seconds  Findings:      The terminal ileum appeared normal.      Two sessile polyps were found in the rectum. The polyps were 2 to 3 mm       in size. These polyps were removed with a cold biopsy forceps. Resection       and retrieval were complete.      The recto-sigmoid colon and sigmoid colon were moderately tortuous.       Biopsies for histology were taken with a cold forceps from the cecum,       ascending colon, transverse colon, descending colon and sigmoid colon       for evaluation of microscopic colitis.      A few small-mouthed diverticula were found in the recto-sigmoid colon       and sigmoid colon.       Internal hemorrhoids were found during retroflexion. The hemorrhoids       were small. Impression:               - The examined portion of the ileum was normal.                           - Two 2 to 3 mm polyps in the rectum, removed with                            a cold biopsy forceps. Resected and retrieved.                           - Tortuous LEFT colon.                           - MILD Diverticulosis in the recto-sigmoid colon                            and in the sigmoid colon.                           - RECTAL BLEEDING DUE TO Internal hemorrhoids.                           - NO SOURCE FOR DIARRHEA IDENTIFIED. Moderate Sedation:      Per Anesthesia Care Recommendation:           - Repeat colonoscopy in 5-10 years for surveillance.                           - High fiber diet and low fat diet.                           - Continue present medications.                           -  Await pathology results.                           - Return to my office in 4 months.                           - Patient has a contact number available for                            emergencies. The signs and symptoms of potential                            delayed complications were discussed with the                            patient. Return to normal activities tomorrow.                            Written discharge instructions were provided to the                            patient. Procedure Code(s):        --- Professional ---                           [phone], Colonoscopy, flexible; with biopsy, single                            or multiple Diagnosis Code(s):        --- Professional ---                           K64.8, Other hemorrhoids                           K62.1, Rectal polyp                           R10.9, Unspecified abdominal pain                           R19.7, Diarrhea, unspecified                           K92.1, Melena (includes Hematochezia)                           K57.30,  Diverticulosis of large intestine without                            perforation or abscess without bleeding                           Q43.8, Other specified congenital malformations of                            intestine CPT copyright 2016 American Medical Association. All rights reserved. The  codes documented in this report are preliminary and upon coder review may  be revised to meet current compliance requirements. Jonette Eva, MD Jonette Eva MD, MD 03/02/2018 8:35:17 AM This report has been signed electronically. Number of Addenda: 0

## 2018-03-02 NOTE — Anesthesia Postprocedure Evaluation (Signed)
Anesthesia Post Note  Patient: LAYLANI PUDWILL  Procedure(s) Performed: COLONOSCOPY WITH PROPOFOL (N/A ) ESOPHAGOGASTRODUODENOSCOPY (EGD) WITH PROPOFOL (N/A ) SAVORY DILATION (N/A )  Patient location during evaluation: PACU Anesthesia Type: MAC Level of consciousness: awake Pain management: pain level controlled Vital Signs Assessment: post-procedure vital signs reviewed and stable Respiratory status: spontaneous breathing, nonlabored ventilation and respiratory function stable Cardiovascular status: blood pressure returned to baseline Postop Assessment: no apparent nausea or vomiting Anesthetic complications: no     Last Vitals:  Vitals:   03/02/18 0632 03/02/18 0725  BP: 135/86 (!) (P) 145/88  Pulse: 74   Resp: 16 (!) 28  Temp: 36.6 C   SpO2: 98% 100%    Last Pain:  Vitals:   03/02/18 3300  TempSrc: Oral  PainSc: 0-No pain                 Jesicca Dipierro J

## 2018-03-02 NOTE — Progress Notes (Signed)
cc'ed to pcp °

## 2018-03-02 NOTE — Telephone Encounter (Signed)
Call pt and ask where Ann [name] would like to go. Ann [name] SAID Ann [name] DID NOT WANT TO GO BACK TO DR. Opal SidlesSWEENEY.

## 2018-03-02 NOTE — H&P (Addendum)
Primary Care Physician:  Richardean Chimera, MD Primary Gastroenterologist:  Dr. Darrick Penna  Pre-Procedure History & Physical: HPI:  Ann [name] is a 52 y.o. female here for ABDOMINAL PAIN/DIARRHEA/DYSPEPSIA.  Past Medical History:  Diagnosis Date  . Anxiety   . Arthritis   . Chronic back pain   . Complication of anesthesia    pt states, "i have migraine and high BP after surgery.".  Marland Kitchen Depression   . GERD (gastroesophageal reflux disease)   . Migraines   . Pancreatitis     Past Surgical History:  Procedure Laterality Date  . BILE DUCT STENT PLACEMENT  07/2011   Dr. Steffanie Dunn: fully covered metal wall stent placed.   . CHOLECYSTECTOMY  2010   with bile leak  . COLONOSCOPY  2011   [facility]  . ERCP  2011-2013   multiple, with multiple stent placements/exchanges. Metal wall stent placed in Aug 2012 AND STILL PRESENT AS OF 2019    Prior to Admission medications   Medication Sig Start Date End Date Taking? Authorizing Provider  ALPRAZolam Prudy Feeler) 1 MG tablet Take 0.5-1 mg by mouth 2 (two) times daily as needed for anxiety or sleep. 01/07/18  Yes [provider]  dexlansoprazole (DEXILANT) 60 MG capsule Take 60 mg by mouth daily as needed (for acid reflux/indigestion.).    Yes [provider]  hydroxypropyl methylcellulose / hypromellose (ISOPTO TEARS / GONIOVISC) 2.5 % ophthalmic solution Place 1-2 drops into both eyes 3 (three) times daily as needed for dry eyes.   Yes [provider]  ibuprofen (ADVIL,MOTRIN) 800 MG tablet Take 800 mg by mouth every 8 (eight) hours as needed (for joint pain.).    Yes [provider]  lipase/protease/amylase (CREON) 36000 UNITS CPEP capsule Take 2 capsules (72,000 Units total) by mouth 3 (three) times daily with meals. 1 with snacks Patient taking differently: Take 72,000 Units by mouth See admin instructions. Take 1 capsule with snacks & 2 capsules with each meal 01/20/18  Yes Gelene Mink, NP  Melatonin 5  MG TABS Take 5 mg by mouth at bedtime as needed (for sleep.).    Yes [provider]    Allergies as of 01/22/2018 - Review Complete 01/20/2018  Allergen Reaction Noted  . Imitrex [sumatriptan] Anaphylaxis and Rash 06/15/2012  . Ciprofloxacin  02/12/2016  . Cymbalta [duloxetine hcl]  02/12/2016  . Doxycycline Diarrhea 06/15/2012  . Lyrica [pregabalin] Swelling and Other (See Comments) 06/15/2012  . Nucynta [tapentadol]  05/20/2015  . Nuvigil [armodafinil]  02/12/2016  . Other  06/06/2015  . Penicillins  02/12/2016  . Amoxicillin-pot clavulanate Diarrhea and Rash 06/15/2012  . Butrans [buprenorphine] Hives, Swelling, and Rash 06/15/2012    Family History  Problem Relation Age of Onset  . Cervical cancer Mother   . Heart attack Unknown   . Migraines Neg Hx   . Seizures Neg Hx   . Dementia Neg Hx   . Colon cancer Neg Hx     Social History   Socioeconomic History  . Marital status: Married    Spouse name: Onalee Hua  . Number of children: 2  . Years of education: 19  . Highest education level: Not on file  Occupational History  . Occupation: Workers comp  Engineer, production  . Financial resource strain: Not on file  . Food insecurity:    Worry: Not on file    Inability: Not on file  . Transportation needs:    Medical: Not on file    Non-medical: Not  on file  Tobacco Use  . Smoking status: Former Smoker    Packs/day: 0.25    Years: 10.00    Pack years: 2.50    Types: Cigarettes    Last attempt to quit: 12/09/2011    Years since quitting: 6.2  . Smokeless tobacco: Never Used  Substance and Sexual Activity  . Alcohol use: No  . Drug use: No  . Sexual activity: Yes    Birth control/protection: Post-menopausal  Lifestyle  . Physical activity:    Days per week: Not on file    Minutes per session: Not on file  . Stress: Not on file  Relationships  . Social connections:    Talks on phone: Not on file    Gets together: Not on file    Attends religious service: Not  on file    Active member of club or organization: Not on file    Attends meetings of clubs or organizations: Not on file    Relationship status: Not on file  . Intimate partner violence:    Fear of current or ex partner: Not on file    Emotionally abused: Not on file    Physically abused: Not on file    Forced sexual activity: Not on file  Other Topics Concern  . Not on file  Social History Narrative   Lives w/ husband   Caffeine use:  none    Review of Systems: See HPI, otherwise negative ROS   Physical Exam: BP 135/86   Pulse 74   Temp 97.9 F (36.6 C) (Oral)   Resp 16   LMP 05/16/2012   SpO2 98%  General:   Alert,  pleasant and cooperative in NAD Head:  Normocephalic and atraumatic. Neck:  Supple; Lungs:  Clear throughout to auscultation.    Heart:  Regular rate and rhythm. Abdomen:  Soft, nontender and nondistended. Normal bowel sounds, without guarding, and without rebound.   Neurologic:  Alert and  oriented x4;  grossly normal neurologically.  Impression/Plan:     ABDOMINAL  PAIN/DIARRHEA/weight loss  PLAN: 1. TCS/EGD TODAY-BIOPSY COLON, STOMACH, & DUODENUM

## 2018-03-02 NOTE — Op Note (Signed)
[facility] Patient Name: Ann [name] Procedure Date: 03/02/2018 8:04 AM MRN: [id number] Date of Birth: 1966-06-29 Attending MD: Jonette Eva MD, MD CSN: [id number] Age: 52 Admit Type: Outpatient Procedure:                Upper GI endoscopy WITH COLD FORCEPS                            BIOPSY/ESOPHAGEAL DILATION Indications:              Dyspepsia, Dysphagia Providers:                Jonette Eva MD, MD, Jannett Celestine, RN, Dyann Ruddle Referring MD:             Lucita Lora. Daniel MD Medicines:                Propofol per Anesthesia Complications:            No immediate complications. Estimated Blood Loss:     Estimated blood loss was minimal. Procedure:                Pre-Anesthesia Assessment:                           - Prior to the procedure, a History and Physical                            was performed, and patient medications and                            allergies were reviewed. The patient's tolerance of                            previous anesthesia was also reviewed. The risks                            and benefits of the procedure and the sedation                            options and risks were discussed with the patient.                            All questions were answered, and informed consent                            was obtained. Prior Anticoagulants: The patient has                            taken ibuprofen, last dose was 1 day prior to                            procedure. ASA Grade Assessment: II - A patient                            with mild systemic disease. After reviewing the  risks and benefits, the patient was deemed in                            satisfactory condition to undergo the procedure.                            After obtaining informed consent, the endoscope was                            passed under direct vision. Throughout the                            procedure, the patient's blood pressure, pulse, and                            oxygen saturations were monitored continuously. The                            [id number] ([id number]) scope was introduced through the                            mouth, and advanced to the second part of duodenum.                            The upper GI endoscopy was accomplished without                            difficulty. The patient tolerated the procedure                            well. Scope In: 8:08:23 AM Scope Out: 8:18:56 AM Total Procedure Duration: 0 hours 10 minutes 33 seconds  Findings:      One moderate (circumferential scarring or stenosis; an endoscope may       pass) benign-appearing, intrinsic stenosis was found. This measured 1.4       cm (inner diameter) and was traversed. A guidewire was placed and the       scope was withdrawn. Dilation was performed with a Savary dilator with       mild resistance at 16 mm and 17 mm. Estimated blood loss was minimal.      A small hiatal hernia was present.      Patchy mild inflammation characterized by congestion (edema) and       erythema was found in the gastric antrum. Biopsies were taken with a       cold forceps for Helicobacter pylori testing.      The examined duodenum was normal. Biopsies for histology were taken with       a cold forceps for evaluation of celiac disease.      A previously placed metal biliary stent was seen in the second portion       of the duodenum. Impression:               - DYPSHAGIA DUE TO Benign-appearing esophageal  STRICTURE/GERD.                           - Small hiatal hernia.                           - MILD Gastritis DUE TO NSAIDs.                           - Normal examined duodenum.                           - Metal biliary stent in the duodenum. Moderate Sedation:      Per Anesthesia Care Recommendation:           - Await pathology results.                           - High fiber diet and low fat diet.                           - Continue  present medications.                           - Return to my office in 4 weeks.                           - Refer to a gastroenterologist at the next                            available appointment TO MANAGE STENT.                           - Patient has a contact number available for                            emergencies. The signs and symptoms of potential                            delayed complications were discussed with the                            patient. Return to normal activities tomorrow.                            Written discharge instructions were provided to the                            patient. Procedure Code(s):        --- Professional ---                           [phone], Esophagogastroduodenoscopy, flexible,                            transoral; with insertion of guide wire followed by  passage of dilator(s) through esophagus over guide                            wire                           43239, Esophagogastroduodenoscopy, flexible,                            transoral; with biopsy, single or multiple Diagnosis Code(s):        --- Professional ---                           K22.2, Esophageal obstruction                           K44.9, Diaphragmatic hernia without obstruction or                            gangrene                           K29.70, Gastritis, unspecified, without bleeding                           R10.13, Epigastric pain                           R13.10, Dysphagia, unspecified CPT copyright 2016 American Medical Association. All rights reserved. The codes documented in this report are preliminary and upon coder review may  be revised to meet current compliance requirements. Jonette Eva, MD Jonette Eva MD, MD 03/02/2018 8:42:05 AM This report has been signed electronically. Number of Addenda: 0

## 2018-03-03 NOTE — Telephone Encounter (Signed)
Tried to call pt, no answer, LMOVM. 

## 2018-03-04 NOTE — Telephone Encounter (Signed)
Letter mailed to pt.  

## 2018-03-04 NOTE — Telephone Encounter (Signed)
Pt called office. She wants to know why she needs to see a gastroenterologist about her stent. States she was out of it after her procedure and doesn't remember what Dr. Darrick PennaFields told her. Her husband also didn't know.   Dr. Darrick PennaFields, she would like for you to call her or send message in MyChart to explain about her stent and what you found during her procedure. [phone].

## 2018-03-06 ENCOUNTER — Encounter: Payer: Self-pay | Admitting: Gastroenterology

## 2018-03-06 DIAGNOSIS — R1310 Dysphagia, unspecified: Secondary | ICD-10-CM

## 2018-03-06 NOTE — Telephone Encounter (Signed)
Called patient TO DISCUSS CONCERNS. LVM-CALL [phone] TO DISCUSS. PT DOES NOT HAVE TO GO BACK TO SEE DR. Opal SidlesSWEENEY. SHE CAN GO TO [facility], [facility], OR [facility]. PT SHOULD LET US KNOW WHERE SHE WANTS TO BE SEEN AND WE WILL MAKE THE REFERRAL.

## 2018-03-08 ENCOUNTER — Ambulatory Visit (HOSPITAL_COMMUNITY)
Admission: RE | Admit: 2018-03-08 | Discharge: 2018-03-08 | Disposition: A | Discharge status: Home / Self Care | Payer: BLUE CROSS/BLUE SHIELD | Source: Ambulatory Visit | Attending: Gastroenterology | Admitting: Gastroenterology

## 2018-03-08 ENCOUNTER — Other Ambulatory Visit: Payer: Self-pay | Admitting: *Deleted

## 2018-03-08 DIAGNOSIS — R1310 Dysphagia, unspecified: Secondary | ICD-10-CM | POA: Insufficient documentation

## 2018-03-08 MED ORDER — IOPAMIDOL (ISOVUE-370) INJECTION 76%
INTRAVENOUS | Status: AC
  Administered 2018-03-08: 150 mL via ORAL
  Filled 2018-03-08: qty 150

## 2018-03-08 MED ORDER — DEXLANSOPRAZOLE 60 MG PO CPDR: 60.0000 mg | DELAYED_RELEASE_CAPSULE | Freq: Every day | ORAL | 3 refills | Status: DC

## 2018-03-08 NOTE — Telephone Encounter (Signed)
[facility] clinical pool: I placed orders for UGI and put in comments "gastrografin" instead of barium. I am not sure that I entered the correct UGI exam we need: may have to check with radiology when setting it up today. Orders are in though.

## 2018-03-08 NOTE — Progress Notes (Signed)
Pt is aware.  

## 2018-03-08 NOTE — Progress Notes (Signed)
cc'd to pcp 

## 2018-03-08 NOTE — Progress Notes (Signed)
[name]: please let her know that the esophagram is normal. I recommend seeing her PCP or urgent care if she is having issues with sore throat, ear pain.

## 2018-03-08 NOTE — Telephone Encounter (Signed)
Ann [name]: I reviewed with Dr. Darrick PennaFields.  I highly doubt she has a complication from the EGD/dilation, but we need to rule it out.  [facility] clinical pool: She needs a gastrografin upper GI TODAY. (not barium). Reason: EGD/dilation last week, reports dysphagia/odynophagia. Assess for perforation.   If this is negative, which I suspect it will be, needs to see PCP to assess for other etiologies. (?strep, etc)

## 2018-03-08 NOTE — Telephone Encounter (Signed)
Called over to Radiology and spoke with Helmut MusterAlicia. Advised her of what AB wanted ordered. She spoke with the radiologists and they feel she needs an esophagram (water soluable). She can do 3:00pm today. Spoke with AB and made aware. She was fine with this. Spoke with pt and is aware of appt time today over at the [facility]. She is also aware not to eat/drink anything until after appointment.

## 2018-03-08 NOTE — Telephone Encounter (Signed)
[name]: please see the mychart note sent on 3/30 (over the weekend).   Can you please call her and see what is going on? In the Mychart message, stated she was having difficulty swallowing with pain in ear and throat since EGD last week. This was over the weekend, so her symptoms may be different now.

## 2018-03-08 NOTE — Progress Notes (Signed)
LMOM to call.

## 2018-03-08 NOTE — Progress Notes (Signed)
SCHEDULED AND ON RECALL  °

## 2018-03-09 ENCOUNTER — Telehealth: Payer: Self-pay

## 2018-03-09 NOTE — Telephone Encounter (Signed)
Thanks, Ann [name]!

## 2018-03-09 NOTE — Telephone Encounter (Signed)
Received fax Dexilant requires a PA. I will be working on this. I am leaving samples at front #15 now and pt is aware.

## 2018-03-10 ENCOUNTER — Telehealth: Payer: Self-pay

## 2018-03-10 NOTE — Telephone Encounter (Signed)
I am working on PA for [employer] Dexilant. LMOM for a return call, which PPI's has she tried. I see pantoprazole listed, are there any others?

## 2018-03-11 ENCOUNTER — Ambulatory Visit (INDEPENDENT_AMBULATORY_CARE_PROVIDER_SITE_OTHER): Payer: Commercial Managed Care - PPO

## 2018-03-11 DIAGNOSIS — D519 Vitamin B12 deficiency anemia, unspecified: Secondary | ICD-10-CM

## 2018-03-11 DIAGNOSIS — E538 Deficiency of other specified B group vitamins: Secondary | ICD-10-CM

## 2018-03-11 NOTE — Nursing Note (Signed)
03/11/18 1400   Medication Administration   Initials CG   Medication  Cyanocobalamin B-12   Medication Dose 1 mL   Route of Administration IM   Site Left Deltoid   NDC # [id number]   LOT # [id number]   Expiration date 09/07/19   Manufacturer [employer]   Clinic Supplied Yes   Patient Supplied No

## 2018-03-12 ENCOUNTER — Encounter (HOSPITAL_COMMUNITY): Payer: Self-pay | Admitting: Gastroenterology

## 2018-03-12 MED ORDER — CYANOCOBALAMIN (VIT B-12) 1,000 MCG/ML INJECTION SOLUTION
1000.00 ug | INTRAMUSCULAR | 0 refills | Status: DC
Start: 2018-03-12 — End: 2018-07-13

## 2018-03-15 NOTE — Telephone Encounter (Signed)
Received a message in cover my meds that the Dexilant has been approved from 03/11/2018 through 03/09/2021. I left Vm on pt's VM. I called the pharmacy and they are aware.

## 2018-03-21 ENCOUNTER — Other Ambulatory Visit (HOSPITAL_BASED_OUTPATIENT_CLINIC_OR_DEPARTMENT_OTHER): Payer: Self-pay | Admitting: PHYSICIAN ASSISTANT

## 2018-03-21 DIAGNOSIS — K219 Gastro-esophageal reflux disease without esophagitis: Secondary | ICD-10-CM

## 2018-03-22 ENCOUNTER — Encounter: Payer: Self-pay | Admitting: Gastroenterology

## 2018-03-22 ENCOUNTER — Ambulatory Visit: Payer: BLUE CROSS/BLUE SHIELD | Admitting: Gastroenterology

## 2018-03-22 VITALS — BP 134/88 | HR 82 | Temp 97.6°F | Ht 66.0 in | Wt 140.0 lb

## 2018-03-22 DIAGNOSIS — G8929 Other chronic pain: Secondary | ICD-10-CM

## 2018-03-22 DIAGNOSIS — R109 Unspecified abdominal pain: Secondary | ICD-10-CM | POA: Diagnosis not present

## 2018-03-22 DIAGNOSIS — K529 Noninfective gastroenteritis and colitis, unspecified: Secondary | ICD-10-CM | POA: Diagnosis not present

## 2018-03-22 MED ORDER — PANCRELIPASE (LIP-PROT-AMYL) 36000-114000 UNITS PO CPEP
72000.0000 [IU] | ORAL_CAPSULE | Freq: Three times a day (TID) | ORAL | 11 refills | Status: DC
Start: 2018-03-22 — End: 2020-01-05

## 2018-03-22 NOTE — Patient Instructions (Signed)
I have refilled Creon for you.  Please call with any changes or concerns.  We will see you back in 1 year!  It was a pleasure to see you today. I strive to create trusting relationships with patients to provide genuine, compassionate, and quality care. I value your feedback. If you receive a survey regarding your visit,  I greatly appreciate you taking time to fill this out.   Gelene MinkAnna W. Eyleen Rawlinson, PhD, ANP-BC [facility]

## 2018-03-22 NOTE — Progress Notes (Signed)
Primary Care Physician:  Richardean Chimeraaniel, Terry G, MD Primary GI: Dr. Darrick PennaFields   Chief Complaint  Patient presents with  . Abdominal Pain    comes/goes    HPI:   Ann [name] is a 52 y.o. 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 Multiple previous ERCPs outside facility after bile leak s/p cholecystectomy. Has seen multiple GI practices. Underwent EGD recently due to dysphagia with findings of benign-appearing esophageal stricture s/p dilation, gastritis. Colonoscopy completed with hyperplastic polyps, negative colonic biopsies. She has been advised to return to tertiary care facility due to her complicated past history and existing metal stent. She is declining this.   Can tell if she misses Creon. Just eating foods that "bind me up". Lettuce, tomatoes go through her. Had 1/2 subway sandwich one day and went through her then the next day didn't. Doesn't want to do Xifaxan. Imodium makes diarrhea worse per patient. Doesn't take Dexilant any longer. Takes a [employer] brand medication. Bentyl made things worse as well.   Past Medical History:  Diagnosis Date  . Anxiety   . Arthritis   . Chronic back pain   . Complication of anesthesia    pt states, "i have migraine and high BP after surgery.".  Marland Kitchen. Depression   . GERD (gastroesophageal reflux disease)   . Migraines   . Pancreatitis     Past Surgical History:  Procedure Laterality Date  . BILE DUCT STENT PLACEMENT  07/2011   Dr. Steffanie DunnSweeny: fully covered metal wall stent placed.   . CHOLECYSTECTOMY  2010   with bile leak  . [facility]  . COLONOSCOPY WITH PROPOFOL N/A 03/02/2018   two 2-3 mm hyperplastic polyps, torturous left colon, mild diverticulosis in rectosigmoid, internal hemorrhoids, colonic biopsies negative  . ERCP  2011-2013   multiple, with multiple stent placements/exchanges. Metal wall stent placed in Aug  2012 AND STILL PRESENT AS OF 2019  . ESOPHAGOGASTRODUODENOSCOPY (EGD) WITH PROPOFOL N/A 03/02/2018   dysphagia due to benign-appearing esophageal stricture/GERD, s/p dilation, small hiatal hernia, gastritis.   Marland Kitchen. SAVORY DILATION N/A 03/02/2018   Procedure: SAVORY DILATION;  Surgeon: West BaliFields, Sandi L, MD;  Location: [facility];  Service: Endoscopy;  Laterality: N/A;    Current Outpatient Medications  Medication Sig Dispense Refill  . ALPRAZolam (XANAX) 1 MG tablet Take 0.5-1 mg by mouth 2 (two) times daily as needed for anxiety or sleep.  0  . dexlansoprazole (DEXILANT) 60 MG capsule Take 1 capsule (60 mg total) by mouth daily. 90 capsule 3  . hydroxypropyl methylcellulose / hypromellose (ISOPTO TEARS / GONIOVISC) 2.5 % ophthalmic solution Place 1-2 drops into both eyes 3 (three) times daily as needed for dry eyes.    Marland Kitchen. ibuprofen (ADVIL,MOTRIN) 800 MG tablet Take 800 mg by mouth every 8 (eight) hours as needed (for joint pain.).     Marland Kitchen. lipase/protease/amylase (CREON) 36000 UNITS CPEP capsule Take 2 capsules (72,000 Units total) by mouth 3 (three) times daily with meals. 1 with snacks 240 capsule 11  . Melatonin 5 MG TABS Take 5 mg by mouth at bedtime as needed (for sleep.).     Marland Kitchen. dicyclomine (BENTYL) 10 MG capsule 1 PO 30 MINUTES PRIOR TO BREAKFAST AND LUNCH (Patient not taking: Reported on 03/22/2018) 62 capsule 11   No current facility-administered medications for this visit.     Allergies as of 03/22/2018 -  Review Complete 03/22/2018  Allergen Reaction Noted  . Imitrex [sumatriptan] Anaphylaxis and Rash 06/15/2012  . Cymbalta [duloxetine hcl] Diarrhea, Nausea Only, and Other (See Comments) 02/12/2016  . Dicyclomine  03/02/2018  . Doxycycline Diarrhea 06/15/2012  . Gabapentin Other (See Comments) 02/17/2018  . Lyrica [pregabalin] Swelling and Other (See Comments) 06/15/2012  . Nucynta [tapentadol]  05/20/2015  . Nuvigil [armodafinil] Other (See Comments) 02/12/2016  . Other  06/06/2015    . Penicillins Other (See Comments) 02/12/2016  . Amoxicillin-pot clavulanate Diarrhea and Rash 06/15/2012  . Butrans [buprenorphine] Hives, Swelling, and Rash 06/15/2012  . Ciprofloxacin Diarrhea, Nausea And Vomiting, and Other (See Comments) 02/12/2016    Family History  Problem Relation Age of Onset  . Cervical cancer Mother   . Heart attack Unknown   . Migraines Neg Hx   . Seizures Neg Hx   . Dementia Neg Hx   . Colon cancer Neg Hx     Social History   Socioeconomic History  . Marital status: Married    Spouse name: Onalee Hua  . Number of children: 2  . Years of education: 67  . Highest education level: Not on file  Occupational History  . Occupation: Workers comp  Engineer, production  . Financial resource strain: Not on file  . Food insecurity:    Worry: Not on file    Inability: Not on file  . Transportation needs:    Medical: Not on file    Non-medical: Not on file  Tobacco Use  . Smoking status: Former Smoker    Packs/day: 0.25    Years: 10.00    Pack years: 2.50    Types: Cigarettes    Last attempt to quit: 12/09/2011    Years since quitting: 6.2  . Smokeless tobacco: Never Used  Substance and Sexual Activity  . Alcohol use: No  . Drug use: No  . Sexual activity: Yes    Birth control/protection: Post-menopausal  Lifestyle  . Physical activity:    Days per week: Not on file    Minutes per session: Not on file  . Stress: Not on file  Relationships  . Social connections:    Talks on phone: Not on file    Gets together: Not on file    Attends religious service: Not on file    Active member of club or organization: Not on file    Attends meetings of clubs or organizations: Not on file    Relationship status: Not on file  Other Topics Concern  . Not on file  Social History Narrative   Lives w/ husband   Caffeine use:  none    Review of Systems: Gen: Denies fever, chills, anorexia. Denies fatigue, weakness, weight loss.  CV: Denies chest pain, palpitations,  syncope, peripheral edema, and claudication. Resp: Denies dyspnea at rest, cough, wheezing, coughing up blood, and pleurisy. GI: see HPI  Derm: Denies rash, itching, dry skin Psych: Denies depression, anxiety, memory loss, confusion. No homicidal or suicidal ideation.  Heme: Denies bruising, bleeding, and enlarged lymph nodes.  Physical Exam: BP 134/88   Pulse 82   Temp 97.6 F (36.4 C) (Oral)   Ht 5\' 6"  (1.676 m)   Wt 140 lb (63.5 kg)   LMP 05/16/2012   BMI 22.60 kg/m  General:   Alert and oriented. No distress noted. Pleasant and cooperative.  Head:  Normocephalic and atraumatic. Eyes:  Conjuctiva clear without scleral icterus. Mouth:  Oral mucosa pink and moist. Good dentition. No lesions. Abdomen:  +  BS, soft, non-tender and non-distended. No rebound or guarding. No HSM or masses noted. Msk:  Symmetrical without gross deformities. Normal posture. Extremities:  Without edema. Neurologic:  Alert and  oriented x4 Psych:  Alert and cooperative. Normal mood and affect.

## 2018-03-25 ENCOUNTER — Encounter: Payer: Self-pay | Admitting: Gastroenterology

## 2018-03-25 NOTE — Assessment & Plan Note (Signed)
No change. Thorough evaluation thus far. Creon has helped. Existing metal stent: needs to return to tertiary facility but she declines this. Will see her back in 1 year.

## 2018-03-25 NOTE — Assessment & Plan Note (Signed)
Thorough evaluation with EGD, colonoscopy, multifactorial. She has noted improvement with empiric pancreatic enzymes. Stress playing a role as well. Encouraged psychological counseling, supportive measures. She has tried multiple agents to include imodium, Bentyl, and states these worsen her symptoms. Declining course of Xifaxan. Overall, she is stable and desires no further changes. Return in 1 year.

## 2018-03-29 ENCOUNTER — Ambulatory Visit: Payer: Commercial Managed Care - PPO | Attending: Surgery

## 2018-03-29 ENCOUNTER — Ambulatory Visit (HOSPITAL_BASED_OUTPATIENT_CLINIC_OR_DEPARTMENT_OTHER): Payer: Commercial Managed Care - PPO

## 2018-03-29 DIAGNOSIS — I10 Essential (primary) hypertension: Secondary | ICD-10-CM

## 2018-03-29 DIAGNOSIS — I2510 Atherosclerotic heart disease of native coronary artery without angina pectoris: Secondary | ICD-10-CM | POA: Insufficient documentation

## 2018-03-29 DIAGNOSIS — E785 Hyperlipidemia, unspecified: Secondary | ICD-10-CM

## 2018-03-29 DIAGNOSIS — E669 Obesity, unspecified: Secondary | ICD-10-CM

## 2018-03-29 DIAGNOSIS — Z6833 Body mass index (BMI) 33.0-33.9, adult: Secondary | ICD-10-CM | POA: Insufficient documentation

## 2018-03-29 DIAGNOSIS — K912 Postsurgical malabsorption, not elsewhere classified: Secondary | ICD-10-CM | POA: Insufficient documentation

## 2018-03-29 DIAGNOSIS — E119 Type 2 diabetes mellitus without complications: Secondary | ICD-10-CM

## 2018-03-29 DIAGNOSIS — E559 Vitamin D deficiency, unspecified: Secondary | ICD-10-CM

## 2018-03-29 LAB — COMPREHENSIVE METABOLIC PNL, FASTING
ALBUMIN: 3.8 g/dL (ref 3.6–4.8)
ALKALINE PHOSPHATASE: 84 U/L (ref 38–126)
ALT (SGPT): 26 U/L (ref 3–45)
ANION GAP: 5 mmol/L
AST (SGOT): 30 U/L (ref 7–56)
BILIRUBIN TOTAL: 0.4 mg/dL (ref 0.2–1.3)
BUN/CREA RATIO: 19
BUN: 19 mg/dL — ABNORMAL HIGH (ref 7–18)
CALCIUM: 9.6 mg/dL (ref 8.5–10.3)
CHLORIDE: 105 mmol/L (ref 101–111)
CHLORIDE: 105 mmol/L (ref 101–111)
CO2 TOTAL: 31 mmol/L (ref 22–31)
CREATININE: 1 mg/dL (ref 0.50–1.20)
ESTIMATED GFR: 58 mL/min/{1.73_m2} — ABNORMAL LOW (ref >=60)
GLUCOSE: 159 mg/dL — ABNORMAL HIGH (ref 68–99)
POTASSIUM: 4.6 mmol/L (ref 3.6–5.0)
PROTEIN TOTAL: 6.5 g/dL (ref 6.2–8.0)
SODIUM: 141 mmol/L (ref 137–145)

## 2018-03-29 LAB — VITAMIN D 25 TOTAL: VITAMIN D 25, TOTAL: 27.6 ng/mL (ref 30.00–100.00)

## 2018-03-29 LAB — CBC WITH DIFF
BASOPHIL #: 0 10*3/uL (ref 0.00–1.00)
BASOPHIL %: 1 % (ref 0–1)
BASOPHIL %: 1 % (ref 0–1)
EOSINOPHIL #: 0.2 10*3/uL (ref 0.00–0.40)
EOSINOPHIL %: 3 % (ref 1–5)
HCT: 40.9 % (ref 37.0–47.0)
HGB: 13.8 g/dL (ref 12.0–16.0)
LYMPHOCYTE #: 3 10*3/uL (ref 1.00–4.00)
LYMPHOCYTE %: 41 % — ABNORMAL HIGH (ref 25–40)
MCH: 30.7 pg (ref 27.0–31.0)
MCHC: 33.7 g/dL (ref 33.0–37.0)
MONOCYTE #: 0.7 10*3/uL (ref 0.10–0.80)
NEUTROPHIL #: 3.3 10*3/uL (ref 1.80–7.00)
NEUTROPHIL %: 46 % — ABNORMAL LOW (ref 50–73)
PLATELETS: 353 10*3/uL (ref 130–400)
RBC: 4.49 10*6/uL (ref 4.20–5.40)
RDW: 13.2 % (ref 11.5–14.5)
WBC: 7.2 10*3/uL (ref 4.8–10.8)

## 2018-03-29 LAB — IRON: IRON: 89 ug/dL (ref 45–170)

## 2018-03-29 LAB — FREE THYROXINE INDEX (T7)
T-UPTAKE: 0.921 UNITS (ref 0.749–1.400)
T4 TOTAL: 7 ug/dL (ref 5.5–11.0)
T7 Calculation: 7.6 (ref 4.60–13.10)

## 2018-03-29 LAB — T-UPTAKE: T-UPTAKE: 0.921 UNITS (ref 0.749–1.400)

## 2018-03-29 LAB — FOLATE: FOLATE: >20 ng/mL

## 2018-03-29 LAB — LIPID PANEL
HDL CHOL: 52 mg/dL (ref 40–59)
LDL CALC: 52 mg/dL — ABNORMAL LOW (ref 60–129)

## 2018-03-29 LAB — THYROXINE, FREE (FREE T4): THYROXINE (T4), FREE: 1.06 ng/dL (ref 0.78–2.19)

## 2018-03-29 LAB — VITAMIN B12: VITAMIN B 12: 852 pg/mL (ref 239–931)

## 2018-03-29 LAB — HGA1C (HEMOGLOBIN A1C WITH EST AVG GLUCOSE): HEMOGLOBIN A1C: 9.6 % — ABNORMAL HIGH (ref <5.7)

## 2018-03-29 NOTE — Progress Notes (Signed)
CC'ED TO PCP 

## 2018-03-30 DIAGNOSIS — E041 Nontoxic single thyroid nodule: Secondary | ICD-10-CM

## 2018-03-30 HISTORY — DX: Nontoxic single thyroid nodule: E04.1

## 2018-04-03 ENCOUNTER — Encounter (HOSPITAL_COMMUNITY): Payer: Self-pay

## 2018-04-03 ENCOUNTER — Observation Stay
Admission: AD | Admit: 2018-04-03 | Discharge: 2018-04-04 | Disposition: A | Discharge status: Home / Self Care | Payer: Commercial Managed Care - PPO | Source: Ambulatory Visit | Attending: Hospitalist | Admitting: Hospitalist

## 2018-04-03 DIAGNOSIS — Z79899 Other long term (current) drug therapy: Secondary | ICD-10-CM | POA: Insufficient documentation

## 2018-04-03 DIAGNOSIS — Z9049 Acquired absence of other specified parts of digestive tract: Secondary | ICD-10-CM | POA: Insufficient documentation

## 2018-04-03 DIAGNOSIS — K219 Gastro-esophageal reflux disease without esophagitis: Secondary | ICD-10-CM | POA: Insufficient documentation

## 2018-04-03 DIAGNOSIS — Z955 Presence of coronary angioplasty implant and graft: Secondary | ICD-10-CM | POA: Insufficient documentation

## 2018-04-03 DIAGNOSIS — I2510 Atherosclerotic heart disease of native coronary artery without angina pectoris: Secondary | ICD-10-CM | POA: Insufficient documentation

## 2018-04-03 DIAGNOSIS — E785 Hyperlipidemia, unspecified: Secondary | ICD-10-CM | POA: Insufficient documentation

## 2018-04-03 DIAGNOSIS — M549 Dorsalgia, unspecified: Secondary | ICD-10-CM | POA: Insufficient documentation

## 2018-04-03 DIAGNOSIS — Z7951 Long term (current) use of inhaled steroids: Secondary | ICD-10-CM | POA: Insufficient documentation

## 2018-04-03 DIAGNOSIS — E669 Obesity, unspecified: Secondary | ICD-10-CM | POA: Insufficient documentation

## 2018-04-03 DIAGNOSIS — Z6831 Body mass index (BMI) 31.0-31.9, adult: Secondary | ICD-10-CM | POA: Insufficient documentation

## 2018-04-03 DIAGNOSIS — G8929 Other chronic pain: Secondary | ICD-10-CM | POA: Insufficient documentation

## 2018-04-03 DIAGNOSIS — E7800 Pure hypercholesterolemia, unspecified: Secondary | ICD-10-CM | POA: Insufficient documentation

## 2018-04-03 DIAGNOSIS — R079 Chest pain, unspecified: Secondary | ICD-10-CM | POA: Diagnosis present

## 2018-04-03 DIAGNOSIS — Z7984 Long term (current) use of oral hypoglycemic drugs: Secondary | ICD-10-CM | POA: Insufficient documentation

## 2018-04-03 DIAGNOSIS — Z8673 Personal history of transient ischemic attack (TIA), and cerebral infarction without residual deficits: Secondary | ICD-10-CM | POA: Insufficient documentation

## 2018-04-03 DIAGNOSIS — R0789 Other chest pain: Secondary | ICD-10-CM | POA: Insufficient documentation

## 2018-04-03 DIAGNOSIS — F17210 Nicotine dependence, cigarettes, uncomplicated: Secondary | ICD-10-CM | POA: Insufficient documentation

## 2018-04-03 DIAGNOSIS — Z961 Presence of intraocular lens: Secondary | ICD-10-CM | POA: Insufficient documentation

## 2018-04-03 DIAGNOSIS — I10 Essential (primary) hypertension: Secondary | ICD-10-CM | POA: Insufficient documentation

## 2018-04-03 DIAGNOSIS — Z7902 Long term (current) use of antithrombotics/antiplatelets: Secondary | ICD-10-CM | POA: Insufficient documentation

## 2018-04-03 DIAGNOSIS — Z9842 Cataract extraction status, left eye: Secondary | ICD-10-CM | POA: Insufficient documentation

## 2018-04-03 DIAGNOSIS — Z9884 Bariatric surgery status: Secondary | ICD-10-CM | POA: Insufficient documentation

## 2018-04-03 DIAGNOSIS — Z951 Presence of aortocoronary bypass graft: Secondary | ICD-10-CM | POA: Insufficient documentation

## 2018-04-03 DIAGNOSIS — Z9841 Cataract extraction status, right eye: Secondary | ICD-10-CM | POA: Insufficient documentation

## 2018-04-03 DIAGNOSIS — E119 Type 2 diabetes mellitus without complications: Secondary | ICD-10-CM | POA: Insufficient documentation

## 2018-04-03 DIAGNOSIS — Z794 Long term (current) use of insulin: Secondary | ICD-10-CM

## 2018-04-03 HISTORY — DX: Chest pain, unspecified: R07.9

## 2018-04-03 MED ORDER — RANITIDINE 150 MG TABLET
150.00 mg | ORAL_TABLET | Freq: Two times a day (BID) | ORAL | Status: DC
Start: 2018-04-04 — End: 2018-04-03

## 2018-04-03 MED ORDER — FAMOTIDINE 20 MG TABLET
20.00 mg | ORAL_TABLET | Freq: Two times a day (BID) | ORAL | Status: DC
Start: 2018-04-04 — End: 2018-04-04
  Administered 2018-04-04: 20 mg via ORAL
  Administered 2018-04-04: 0 mg via ORAL
  Filled 2018-04-03: qty 1

## 2018-04-03 MED ORDER — CLOPIDOGREL 75 MG TABLET
75.0000 mg | ORAL_TABLET | Freq: Every day | ORAL | Status: DC
Start: 2018-04-04 — End: 2018-04-04
  Administered 2018-04-04: 75 mg via ORAL
  Filled 2018-04-03: qty 1

## 2018-04-03 MED ORDER — METOPROLOL SUCCINATE ER 25 MG TABLET,EXTENDED RELEASE 24 HR
12.5000 mg | ORAL_TABLET | Freq: Every day | ORAL | Status: DC
Start: 2018-04-04 — End: 2018-04-04
  Administered 2018-04-04: 12.5 mg via ORAL
  Filled 2018-04-03: qty 1

## 2018-04-03 MED ORDER — NITROGLYCERIN 0.4 MG SUBLINGUAL TABLET
0.4000 mg | SUBLINGUAL_TABLET | SUBLINGUAL | Status: DC | PRN
Start: 2018-04-03 — End: 2018-04-04

## 2018-04-03 MED ORDER — ASPIRIN 81 MG CHEWABLE TABLET
81.0000 mg | CHEWABLE_TABLET | Freq: Every day | ORAL | Status: DC
Start: 2018-04-04 — End: 2018-04-04
  Administered 2018-04-04: 81 mg via ORAL
  Filled 2018-04-03: qty 1

## 2018-04-03 MED ORDER — INSULIN ASPART 100 UNIT/ML INJECTION CORRECTIONAL - CCMC
1.0000 [IU] | PEN_INJECTOR | Freq: Four times a day (QID) | SUBCUTANEOUS | Status: DC
Start: 2018-04-04 — End: 2018-04-04
  Administered 2018-04-04 (×2): 2 [IU] via SUBCUTANEOUS
  Administered 2018-04-04: 1 [IU] via SUBCUTANEOUS
  Filled 2018-04-03: qty 300

## 2018-04-03 MED ORDER — FUROSEMIDE 40 MG TABLET
40.0000 mg | ORAL_TABLET | Freq: Every day | ORAL | Status: DC
Start: 2018-04-04 — End: 2018-04-04
  Administered 2018-04-04: 09:00:00 40 mg via ORAL
  Filled 2018-04-03: qty 1

## 2018-04-03 MED ORDER — LISINOPRIL 2.5 MG TABLET
2.5000 mg | ORAL_TABLET | Freq: Every day | ORAL | Status: DC
Start: 2018-04-04 — End: 2018-04-04
  Administered 2018-04-04: 2.5 mg via ORAL
  Filled 2018-04-03: qty 1

## 2018-04-03 MED ORDER — HYDROCODONE 10 MG-ACETAMINOPHEN 325 MG TABLET
1.00 | ORAL_TABLET | ORAL | Status: DC | PRN
Start: 2018-04-03 — End: 2018-04-04
  Administered 2018-04-04 (×3): 1 via ORAL
  Filled 2018-04-03 (×3): qty 1

## 2018-04-03 MED ORDER — ROSUVASTATIN 10 MG TABLET
20.0000 mg | ORAL_TABLET | Freq: Every day | ORAL | Status: DC
Start: 2018-04-04 — End: 2018-04-04
  Administered 2018-04-04: 09:00:00 20 mg via ORAL
  Filled 2018-04-03: qty 2

## 2018-04-03 MED ORDER — OMEPRAZOLE 40 MG CAPSULE,DELAYED RELEASE
40.00 mg | DELAYED_RELEASE_CAPSULE | Freq: Every morning | ORAL | Status: DC
Start: 2018-04-04 — End: 2018-04-04

## 2018-04-03 NOTE — H&P (Signed)
[facility]  General Medicine  Admission H&P    Date of Service:  04/03/2018  Monique, [name], 52 y.o. female  Encounter Start Date:  04/03/2018  Inpatient Admission Date:   Date of Birth:  22-Dec-1965  PCP: Cherly Beach, MD    LAY CAREGIVER   Appointed Lay Caregiver?: I Decline     Information Obtained from: patient  Chief Complaint:  Chest pain    HPI: Monique [name] is a 52 y.o., White female with a PMH of CAD who presents with chest pain earlier in the afternoon lasting about 15 minutes. The pain was a pressure like sensation. Her chest pressure was improving at the outside facility. She did receive aspirin there. Currently she denies any chest pain or pressure. She denies any nausea, vomiting, abdominal pain, dyspnea    Her outside medical records were reviewed including labs, EKG, CXR,(grossly unremarkable) and physician notes.       PAST MEDICAL:    Past Medical History:   Diagnosis Date   . Anxiety    . BMI 35.0-35.9,adult    . Carotid stenosis    . Chronic back pain    . Coronary artery disease    . DDD (degenerative disc disease), lumbar     "entire spine"   . Ear piercing    . GERD (gastroesophageal reflux disease)    . H/O complete eye exam     2 years, Dr. [name], at [facility]   . History of dental examination     dentures upper and lower   . HTN    . Hyperlipidemia    . Neck problem     herniated cervical disc   . Obesity    . Stroke (CMS [facility])     2002   . Tattoo     Left breast, Right shoulder   . Type 2 diabetes mellitus     Dx 1999 average fasting 70-89        Past Surgical History:   Procedure Laterality Date   . BYPASS GRAFT  2012    cardiac, 2 vessel, Dr. [name], [facility]   . ENDOMETRIAL ABLATION  1998   . HX ANKLE FRACTURE TX      Right, Doran Durand procedure   . HX CATARACT REMOVAL      r and l eyes with implants   . HX CESAREAN SECTION  06/20/2000    x3, 11/21/86, 11/10/84   . HX CHOLECYSTECTOMY  1988   . HX CORONARY STENT PLACEMENT  2012    x4 stents   . HX GASTRIC  SLEEVE  02/2017    [place], [place], Dr. Pearlie Oyster   . HX TONSILLECTOMY      as child            Medications Prior to Admission     Prescriptions    ACCU-CHEK AVIVA PLUS TEST STRP Strip    1 Strip by Subcutaneous route Three times a day as needed    BYDUREON 2 mg Subcutaneous    2 mg by Subcutaneous route Every 7 days    clopidogrel (PLAVIX) 75 mg Oral Tablet    Take 1 Tab (75 mg total) by mouth Once a day    cyanocobalamin (VITAMIN B12) 1,000 mcg/mL Injection Solution    1 mL (1,000 mcg total) by Subcutaneous route Every 30 days    Dextromethorphan-Guaifenesin 15-100 mg/5 mL Oral Syrup    Take 10 mL by mouth Every 6 hours as needed for Cough  Patient not taking:  Reported on 02/15/2018    diclofenac sodium (VOLTAREN) 1 % Gel    2 g by Apply Topically route Four times a day - before meals and bedtime    flash glucose scanning reader (FREESTYLE LIBRE 14 DAY READER) Does not apply Misc    For daily use for 14 days.    flash glucose sensor (FREESTYLE LIBRE 14 DAY SENSOR) Does not apply Kit    For daily use for 14 days.    fluticasone (FLONASE) 50 mcg/actuation Nasal Spray, Suspension    2 Sprays by Each Nostril route Once a day    furosemide (LASIX) 40 mg Oral Tablet    Take 1 Tab (40 mg total) by mouth Once a day    HYDROcodone-acetaminophen (NORCO) 10-325 mg Oral Tablet    Take 1 Tab by mouth Every 4 hours as needed    Ibuprofen (MOTRIN) 800 mg Oral Tablet    Take 800 mg by mouth Three times a day as needed for Pain    lisinopril (PRINIVIL) 2.5 mg Oral Tablet    take 1 tablet by mouth once daily    loratadine (CLARITIN) 10 mg Oral Tablet    Take 1 Tab (10 mg total) by mouth Once a day    metFORMIN (GLUCOPHAGE) 500 mg Oral Tablet    Take 1 Tab (500 mg total) by mouth Twice daily with food    metoprolol succinate (TOPROL-XL) 25 mg Oral Tablet Sustained Release 24 hr    Take 0.5 Tabs (12.5 mg total) by mouth Once a day    niacin (NIASPAN) 500 mg Oral Tablet Sustained Release    Take 1 Tab (500 mg total) by mouth Once a day       nitroGLYCERIN (NITROSTAT) 0.4 mg Sublingual Tablet, Sublingual    1 Tab (0.4 mg total) by Sublingual route Every 5 minutes as needed for Chest pain for 3 doses over 15 minutes    omeprazole (PRILOSEC) 40 mg Oral Capsule, Delayed Release(E.C.)    take 1 capsule by mouth once daily    ondansetron (ZOFRAN ODT) 8 mg Oral Tablet, Rapid Dissolve    Take 1 Tab (8 mg total) by mouth Every 8 hours as needed for nausea/vomiting    PRENATAL PLUS, CALCIUM CARB, 27 mg iron- 1 mg Oral Tablet    Take 1 Tab by mouth Once a day    raNITIdine (ZANTAC) 150 mg Oral Tablet    Take 1 Tab (150 mg total) by mouth Twice daily    rosuvastatin (CRESTOR) 20 mg Oral Tablet    Take 1 Tab (20 mg total) by mouth Once a day    VENTOLIN HFA 90 mcg/actuation Inhalation HFA Aerosol Inhaler    Take 1-2 Puffs by inhalation Every 6 hours as needed (wheezing, cough)        Allergies   Allergen Reactions   . No Known Drug Allergies          Family History:  Family Medical History:     Problem Relation (Age of Onset)    Atrial fibrillation Sister    Colon Cancer Father    Diabetes Mother    Heart Attack Mother    Heart Disease Sister, Maternal Grandfather    Hypertension (High Blood Pressure) Mother    Lung Cancer Maternal Grandfather           Social History:  Social History     Tobacco Use   . Smoking status: Current Every Day Smoker  Packs/day: 0.25     Years: 20.00     Pack years: 5.00     Types: Cigarettes     Last attempt to quit: 03/30/2011     Years since quitting: 7.0   . Smokeless tobacco: Never Used   Substance Use Topics   . Alcohol use: No        ROS: Other than ROS in the HPI, all other systems were negative.    Examination:  Temperature: 36.7 C (98 F) Heart Rate: 62 BP (Non-Invasive): 110/62   Respiratory Rate: 20 SpO2: 96 % Pain Score (Numeric, Faces): 0   General: appears stated age  Eyes: Sclera non-icteric.   HENT:Mouth mucous membranes moist.   Neck: no thyromegaly  Lungs: Clear to auscultation bilaterally.   Cardiovascular:  regular rate and rhythm  S1, S2 normal  Abdomen: Soft, non-tender, non-distended  Genito-urinary: Deferred  Extremities: No cyanosis or edema  Skin: Skin warm and dry  Neurologic: Grossly normal, Alert and oriented x3  Psychiatric: Normal    Labs:    Lab Results Today:    Results for orders placed or performed during the hospital encounter of 04/03/18 (from the past 24 hour(s))   TROPONIN-I   Result Value Ref Range    TROPONIN I <0.01 0.00 - 0.03 ng/mL         DNR Status:  Prior    Assessment/Plan:   Active Hospital Problems    Diagnosis   . Chest pain   . Diabetes Mellitus, Type II     Chest pain - aspirin, plavix, statin, beta blocker, nitro PRN, trend troponins.    Diabetes - sliding scale insuline    DVT/PE Prophylaxis: Enoxaparin    Stacie Acres, MD

## 2018-04-03 NOTE — Care Plan (Signed)
Problem: Adult Inpatient Plan of Care  Goal: Plan of Care Review  Outcome: Ongoing (see interventions/notes)  Goal: Patient-Specific Goal (Individualization)  Outcome: Ongoing (see interventions/notes)  Goal: Absence of Hospital-Acquired Illness or Injury  Outcome: Ongoing (see interventions/notes)  Goal: Optimal Comfort and Wellbeing  Outcome: Ongoing (see interventions/notes)  Goal: Rounds/Family Conference  Outcome: Ongoing (see interventions/notes)     Problem: Adjustment to Illness (Acute Coronary Syndrome)  Goal: Optimal Adaptation to Illness  Outcome: Ongoing (see interventions/notes)     Problem: Arrhythmia (Acute Coronary Syndrome)  Goal: Normalized Cardiac Rhythm  Outcome: Ongoing (see interventions/notes)     Problem: Pain (Acute Coronary Syndrome)  Goal: Absence of Cardiac-Related Pain  Outcome: Ongoing (see interventions/notes)     Problem: Hemodynamic Instability (Acute Coronary Syndrome)  Goal: Effective Cardiac Pump Function  Outcome: Ongoing (see interventions/notes)     Problem: Tissue Perfusion (Acute Coronary Syndrome)  Goal: Adequate Tissue Perfusion  Outcome: Ongoing (see interventions/notes)

## 2018-04-03 NOTE — Nurses Notes (Signed)
Pt. Received to floor from [place] ed awake, alert, oriented x4, admitted with chest pressure, denies chest pain or pressure at present time, room air, vs stable, family members present, admission to be done, telemetry placed sinus rhythm, will monitor.

## 2018-04-04 LAB — COMPREHENSIVE METABOLIC PANEL, NON-FASTING
ALBUMIN: 3.1 g/dL — ABNORMAL LOW (ref 3.6–4.8)
ALKALINE PHOSPHATASE: 88 U/L (ref 38–126)
ANION GAP: 6 mmol/L
BILIRUBIN TOTAL: 0.3 mg/dL (ref 0.2–1.3)
BUN/CREA RATIO: 20
BUN: 16 mg/dL (ref 7–18)
BUN: 16 mg/dL (ref 7–18)
CALCIUM: 9.3 mg/dL (ref 8.5–10.3)
CHLORIDE: 107 mmol/L (ref 101–111)
CO2 TOTAL: 26 mmol/L (ref 22–31)
CREATININE: 0.8 mg/dL (ref 0.50–1.20)
ESTIMATED GFR: 75 mL/min/1.73mˆ2 (ref >=60)
GLUCOSE: 169 mg/dL — ABNORMAL HIGH (ref 68–99)
POTASSIUM: 4.3 mmol/L (ref 3.6–5.0)
PROTEIN TOTAL: 5.6 g/dL — ABNORMAL LOW (ref 6.2–8.0)
SODIUM: 139 mmol/L (ref 137–145)

## 2018-04-04 LAB — TROPONIN-I: TROPONIN I: <0.01 ng/mL (ref 0.00–0.03)

## 2018-04-04 LAB — CBC
MCH: 31.3 pg — ABNORMAL HIGH (ref 27.0–31.0)
MCV: 90.3 fL (ref 80.0–99.0)
PLATELETS: 300 10*3/uL (ref 130–400)
RBC: 4.04 10*6/uL — ABNORMAL LOW (ref 4.20–5.40)
WBC: 7.7 10*3/uL (ref 4.8–10.8)

## 2018-04-04 LAB — POC BLOOD GLUCOSE (RESULTS)
GLUCOSE, POC: 154 mg/dl (ref 80–130)
GLUCOSE, POC: 209 mg/dl (ref 80–130)

## 2018-04-04 MED ORDER — ENOXAPARIN 40 MG/0.4 ML SUBCUTANEOUS SYRINGE
40.0000 mg | INJECTION | SUBCUTANEOUS | Status: DC
Start: 2018-04-04 — End: 2018-04-04
  Administered 2018-04-04: 40 mg via SUBCUTANEOUS
  Filled 2018-04-04: qty 0.4

## 2018-04-04 MED ORDER — PANTOPRAZOLE 40 MG TABLET,DELAYED RELEASE
40.00 mg | DELAYED_RELEASE_TABLET | Freq: Every day | ORAL | Status: DC
Start: 2018-04-04 — End: 2018-04-04

## 2018-04-04 MED ORDER — SODIUM CHLORIDE 0.9 % (FLUSH) INJECTION SYRINGE
3.0000 mL | INJECTION | INTRAMUSCULAR | Status: DC | PRN
Start: 2018-04-04 — End: 2018-04-04

## 2018-04-04 MED ORDER — SODIUM CHLORIDE 0.9 % (FLUSH) INJECTION SYRINGE
3.0000 mL | INJECTION | Freq: Three times a day (TID) | INTRAMUSCULAR | Status: DC
Start: 2018-04-04 — End: 2018-04-04
  Administered 2018-04-04: 3 mL
  Administered 2018-04-04: 0 mL

## 2018-04-04 MED ORDER — PANTOPRAZOLE 40 MG TABLET,DELAYED RELEASE
40.00 mg | DELAYED_RELEASE_TABLET | Freq: Every day | ORAL | Status: DC
Start: 2018-04-04 — End: 2018-04-04
  Administered 2018-04-04 (×2): 40 mg via ORAL
  Filled 2018-04-04: qty 1

## 2018-04-04 MED ADMIN — lactated Ringers intravenous solution: SUBCUTANEOUS | @ 07:00:00 | NDC 00338011704

## 2018-04-04 MED ADMIN — lactated Ringers intravenous solution: ORAL | @ 09:00:00 | NDC 00338011704

## 2018-04-04 NOTE — Nurses Notes (Signed)
Pt. Sleeping, has slept well, has denied chest pain or problems since admission, room air, vs stable, seen by dr. Coralee Pesa, sinus rhythm, admission completed, troponin neg. X2, safety maintained, no problems or changes, will monitor.

## 2018-04-04 NOTE — Discharge Summary (Signed)
[facility]  DISCHARGE SUMMARY      PATIENT NAME:  Monique [name], Monique [name]  MRN:  [id number]  DOB:  04/26/1966    ENCOUNTER DATE:  04/03/2018  INPATIENT ADMISSION DATE:   DISCHARGE DATE:  04/04/2018    ATTENDING PHYSICIAN: Curt Jews, MD  SERVICE: CCM HOSPITALIST  PRIMARY CARE PHYSICIAN: Cherly Beach, MD     Reason for Admission     Diagnosis        Chest pain [facility]            Active Hospital Problems    Diagnosis Date Noted   . Chest pain 04/03/2018   . Diabetes Mellitus, Type II 03/24/2003      Resolved Hospital Problems   No resolved problems to display.     Active Non-Hospital Problems    Diagnosis Date Noted   . DDD (degenerative disc disease), lumbar    . Anxiety    . Chronic back pain    . Carotid stenosis    . BMI 35.0-35.9,adult    . H/O complete eye exam    . History of dental examination    . 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 Hypercholesterolemia 04/15/2002      Allergies   Allergen Reactions   . No Known Drug Allergies             DISCHARGE MEDICATIONS:     Current Discharge Medication List      CONTINUE these medications - NO CHANGES were made during your visit.      Details   ACCU-CHEK AVIVA PLUS TEST STRP Strip  Generic drug:  Blood Sugar Diagnostic   1 Strip, Subcutaneous, 3 TIMES DAILY PRN  Refills:  0     BYDUREON 2 mg  Generic drug:  exenatide microspheres ER   2 mg, Subcutaneous, EVERY 7 DAYS  Qty:  24 mg  Refills:  3     clopidogrel 75 mg Tablet  Commonly known as:  PLAVIX   75 mg, Oral, DAILY  Qty:  90 Tab  Refills:  3     cyanocobalamin 1,000 mcg/mL Solution  Commonly known as:  VITAMIN B12   1,000 mcg, Subcutaneous, EVERY 30 DAYS  Qty:  1 Vial  Refills:  0     Dextromethorphan-Guaifenesin 15-100 mg/5 mL Syrup   10 mL, Oral, EVERY 6 HOURS PRN  Qty:  240 mL  Refills:  0     diclofenac sodium 1 % Gel  Commonly known as:  VOLTAREN   2 g, Apply Topically, 4 TIMES DAILY - BEFORE MEALS AND NIGHTLY  Qty:  1 Tube  Refills:  3     flash glucose scanning reader  Misc  Commonly known as:  FREESTYLE LIBRE 14 DAY READER   For daily use for 14 days.  Qty:  1 Each  Refills:  0     flash glucose sensor Kit  Commonly known as:  FREESTYLE LIBRE 14 DAY SENSOR   For daily use for 14 days.  Qty:  1 Each  Refills:  0     fluticasone propionate 50 mcg/actuation Spray, Suspension  Commonly known as:  FLONASE   2 Sprays, Each Nostril, DAILY  Qty:  1 Bottle  Refills:  1     furosemide 40 mg Tablet  Commonly known as:  LASIX   40 mg, Oral, DAILY  Qty:  90 Tab  Refills:  3     HYDROcodone-acetaminophen 10-325 mg Tablet  Commonly known as:  NORCO   1 Tab, Oral, EVERY 4 HOURS PRN  Refills:  0     Ibuprofen 800 mg Tablet  Commonly known as:  MOTRIN   800 mg, Oral, 3 TIMES DAILY PRN  Refills:  0     lisinopril 2.5 mg Tablet  Commonly known as:  PRINIVIL   take 1 tablet by mouth once daily  Qty:  90 Tab  Refills:  3     loratadine 10 mg Tablet  Commonly known as:  CLARITIN   10 mg, Oral, DAILY  Qty:  30 Tab  Refills:  2     metFORMIN 500 mg Tablet  Commonly known as:  GLUCOPHAGE   500 mg, Oral, 2 TIMES DAILY WITH FOOD  Qty:  60 Tab  Refills:  2     metoprolol succinate 25 mg Tablet Sustained Release 24 hr  Commonly known as:  TOPROL-XL   12.5 mg, Oral, DAILY  Qty:  45 Tab  Refills:  3     niacin 500 mg Tablet Sustained Release  Commonly known as:  NIASPAN   500 mg, Oral, DAILY  Qty:  90 Tab  Refills:  3     nitroGLYCERIN 0.4 mg Tablet, Sublingual  Commonly known as:  NITROSTAT   0.4 mg, Sublingual, EVERY 5 MIN PRN, for 3 doses over 15 minutes  Qty:  25 Tab  Refills:  3     omeprazole 40 mg Capsule, Delayed Release(E.C.)  Commonly known as:  PRILOSEC   take 1 capsule by mouth once daily  Qty:  30 Cap  Refills:  2     ondansetron 8 mg Tablet, Rapid Dissolve  Commonly known as:  ZOFRAN ODT   8 mg, Oral, EVERY 8 HOURS PRN  Qty:  30 Tab  Refills:  1     PRENATAL PLUS (CALCIUM CARB) 27 mg iron- 1 mg Tablet  Generic drug:  prenatal vitamin   1 Tab, Oral, DAILY  Qty:  90 Tab  Refills:  3     raNITIdine  150 mg Tablet  Commonly known as:  ZANTAC   150 mg, Oral, 2 TIMES DAILY  Qty:  90 Tab  Refills:  1     rosuvastatin 20 mg Tablet  Commonly known as:  CRESTOR   20 mg, Oral, DAILY  Qty:  90 Tab  Refills:  3     VENTOLIN HFA 90 mcg/actuation HFA Aerosol Inhaler  Generic drug:  albuterol sulfate   1-2 Puffs, Inhalation, EVERY 6 HOURS PRN  Qty:  1 Inhaler  Refills:  1          Discharge med list refreshed?  YES        DISCHARGE INSTRUCTIONS:  Follow-up Information     Cherly Beach, MD In 1 week.    Specialty:  FAMILY PRACTICE  Contact information:  [address]  [place] [address]  [phone]             Garlan Fair, MD In 1 week.    Specialty:  CARDIOLOGY  Contact information:  [address]  [place] [address]  [phone]                    DISCHARGE INSTRUCTION - CARDIAC DIET     Diet: CARDIAC DIET      DISCHARGE INSTRUCTION - ACTIVITY AS TOLERATED    Have an outpatient stress test to be arranged by your PCP     Activity: AS TOLERATED  REASON FOR HOSPITALIZATION AND HOSPITAL COURSE:    Monique [name] is a 52 y.o., White female with a PMH of CAD who presents with chest pain earlier in the afternoon lasting about 15 minutes. The pain was a pressure like sensation. Her chest pressure was improving at the outside facility. She did receive aspirin there.  she had no acute EKG changes.  Her troponins were negative as well.  Her symptoms had completely resolved while in the hospital.  A stress test was offered to the patient however she felt that she could do it outpatient and he she will follow up with her cardiologist Dr. Higinio Roger.  Given all of her workup was negative I felt it is appropriate and a outpatient stress test.  She also did have a bandlike discomfort in mid abdomen which was short and had completely resolved and she did not have any further abdominal symptoms.    No acute distress, clear to auscultation, regular rate rhythm, soft nontender positive bowel  sounds    Discharge diagnosis  Chest pain atypical likely musculoskeletal      CONDITION ON DISCHARGE:  A. Ambulation: Full ambulation  B. Self-care Ability: Complete  C. Cognitive Status Alert and Oriented x 3  D. DNR status at discharge: Full Code  E. Lace + Score: 32 (04/04/18 0530)       Advance Directive Information      Most Recent Value   Does the Patient have an Advance Directive?  No, Information Offered and Refused          DISCHARGE DISPOSITION:    DISCHARGE PATIENT   Ordered at: 04/04/18 1456     Is there a planned readmission to acute care within 30 days?    No     Disposition:    HOME/SELF CARE/WITH FAMILY MEMBER/OTHER                     Curt Jews, MD        Copies sent to Care Team       Relationship Specialty Notifications Start End    Cherly Beach, MD PCP - General FAMILY PRACTICE  10/21/16     Phone: [phone] Fax: [phone]         [place] [address]    Cherly Beach, MD PCP - Claims Attributed   09/07/15     Set automatically for Most Visit Over 18 Months    Phone: [phone] Fax: [phone]         [address] [place] [address]    Garlan Fair, MD PCP - Cardiologist CARDIOLOGY  08/27/17     Phone: [phone] Fax: [phone]         [place] [address]    Parthenia Ames, MD Consulting Physician GASTROENTEROLOGY  09/15/17     Phone: [phone] Fax: [phone]         [address]  [place] [address]    Rosaria Ferries., MD Consulting Physician PAIN MANAGEMENT  09/15/17     Phone: [phone] Fax: [place] [address]          Referring providers can utilize [email or web address] to access their referred [facility] patient's information.

## 2018-04-04 NOTE — Nurses Notes (Signed)
Patient given discharge instructions and verbalizes understanding as instructed.  Ambulated out the [employer] with spouse.  Mali Rissie Sculley, RN  04/04/2018, 15:33

## 2018-04-06 ENCOUNTER — Ambulatory Visit (INDEPENDENT_AMBULATORY_CARE_PROVIDER_SITE_OTHER): Payer: Commercial Managed Care - PPO

## 2018-04-06 ENCOUNTER — Telehealth (HOSPITAL_BASED_OUTPATIENT_CLINIC_OR_DEPARTMENT_OTHER): Payer: Self-pay | Admitting: Family Medicine

## 2018-04-06 DIAGNOSIS — D519 Vitamin B12 deficiency anemia, unspecified: Secondary | ICD-10-CM

## 2018-04-06 DIAGNOSIS — E538 Deficiency of other specified B group vitamins: Secondary | ICD-10-CM

## 2018-04-06 LAB — ECG 12 LEAD
Atrial Rate: 63 {beats}/min
Atrial Rate: 69 {beats}/min
Calculated P Axis: 41 degrees
Calculated P Axis: 52 degrees
Calculated R Axis: -21 degrees
Calculated R Axis: -20 degrees
Calculated T Axis: 14 degrees
PR Interval: 166 ms
PR Interval: 166 ms
QRS Duration: 86 ms
QRS Duration: 90 ms
QT Interval: 436 ms
QT Interval: 400 ms
QTC Calculation: 446 ms
QTC Calculation: 428 ms
Ventricular rate: 63 {beats}/min
Ventricular rate: 63 {beats}/min
Ventricular rate: 69 {beats}/min

## 2018-04-06 NOTE — Nursing Note (Signed)
04/06/18 1600   Date and Time of Coordination Contact   Date 04/06/18   Time 1500   Patient Contact   Contact made via In-person   Spoke with Patient   Call Outcome Completed   Medications   Reviewed Med Chngs/Adds/Dele Yes   Medications obtained Yes   Med adher/comp concerns addressed Yes   Adverse Effects No   Clinical Needs   Pt/Caregiver aware Reason for Hosp Yes, reinforced   Pt/Caregiver Aware Reasons Causing Return to [facility] Yes, reinforced   Experiencing acute symptoms No   Hospital Discharge Follow Up Appointment   Reviewed PCP Follow-up Appt Appt not yet scheduled   Appt Not Scheduled Pt/caregiver to schedule   Name/Number of practice given   [phone])   Ordered labs, tests, imaging, etc reviewed w/pt? No   Post Acute Services Ordered at Discharge   Reviewed Post-Acute Agency/Service Orders Not applicable   Additional Interventions   PCP PCP verified   Approx. Time Spent (min.) 0-5

## 2018-04-06 NOTE — Nursing Note (Signed)
04/06/18 1400   Medication Administration   Initials ac   Medication  Cyanocobalamin B-12   Medication Dose 1 ml   Route of Administration IM   Site Left Leg   LOT # [id number]   Expiration date 09/07/19   Manufacturer [email or web address]   Clinic Supplied No   Patient Supplied Yes

## 2018-04-15 ENCOUNTER — Ambulatory Visit (HOSPITAL_BASED_OUTPATIENT_CLINIC_OR_DEPARTMENT_OTHER): Payer: Commercial Managed Care - PPO

## 2018-04-15 ENCOUNTER — Encounter (HOSPITAL_BASED_OUTPATIENT_CLINIC_OR_DEPARTMENT_OTHER): Payer: Self-pay | Admitting: Cardiovascular Disease

## 2018-04-15 ENCOUNTER — Encounter (HOSPITAL_BASED_OUTPATIENT_CLINIC_OR_DEPARTMENT_OTHER): Payer: Self-pay | Admitting: PHYSICIAN ASSISTANT

## 2018-04-15 ENCOUNTER — Ambulatory Visit
Admission: RE | Admit: 2018-04-15 | Discharge: 2018-04-15 | Disposition: A | Discharge status: Home / Self Care | Payer: Commercial Managed Care - PPO | Source: Ambulatory Visit | Attending: PHYSICIAN ASSISTANT | Admitting: PHYSICIAN ASSISTANT

## 2018-04-15 ENCOUNTER — Ambulatory Visit (INDEPENDENT_AMBULATORY_CARE_PROVIDER_SITE_OTHER): Payer: Commercial Managed Care - PPO | Admitting: PHYSICIAN ASSISTANT

## 2018-04-15 VITALS — BP 106/65 | HR 82 | Temp 98.4°F | Resp 16 | Ht 66.0 in | Wt 190.0 lb

## 2018-04-15 DIAGNOSIS — E041 Nontoxic single thyroid nodule: Secondary | ICD-10-CM

## 2018-04-15 DIAGNOSIS — R110 Nausea: Secondary | ICD-10-CM

## 2018-04-15 DIAGNOSIS — K5900 Constipation, unspecified: Secondary | ICD-10-CM

## 2018-04-15 LAB — URINALYSIS, MACROSCOPIC
GLUCOSE: NOT DETECTED mg/dL
LEUKOCYTES: NOT DETECTED WBCs/uL
NITRITE: NOT DETECTED
PROTEIN: NOT DETECTED mg/dL
SPECIFIC GRAVITY: 1.008 (ref 1.005–1.030)

## 2018-04-15 LAB — BASIC METABOLIC PANEL
ANION GAP: 8 mmol/L
BUN: 15 mg/dL (ref 7–18)
CALCIUM: 9.6 mg/dL (ref 8.5–10.3)
CHLORIDE: 101 mmol/L (ref 101–111)
CO2 TOTAL: 30 mmol/L (ref 22–31)
CREATININE: 0.9 mg/dL (ref 0.50–1.20)
ESTIMATED GFR: 65 mL/min/1.73mˆ2 (ref >=60)
GLUCOSE: 190 mg/dL — ABNORMAL HIGH (ref 68–99)
POTASSIUM: 4.2 mmol/L (ref 3.6–5.0)
SODIUM: 139 mmol/L (ref 137–145)
SODIUM: 139 mmol/L (ref 137–145)

## 2018-04-15 MED ORDER — DOCUSATE SODIUM 100 MG CAPSULE
100.00 mg | ORAL_CAPSULE | Freq: Two times a day (BID) | ORAL | 2 refills | Status: DC
Start: 2018-04-15 — End: 2018-07-13

## 2018-04-15 MED ORDER — ONDANSETRON 8 MG DISINTEGRATING TABLET
8.0000 mg | ORAL_TABLET | Freq: Three times a day (TID) | ORAL | 1 refills | Status: DC | PRN
Start: 2018-04-15 — End: 2018-07-13

## 2018-04-15 NOTE — Progress Notes (Signed)
Patient Name: Monique [name]  Date: 04/15/2018  [place], [place]  [address]  [place] [address]  [phone]  MRN: [id number]  DOB: 02/27/1966    Chief complaint: Side Pain (Left side pain x 2 weeks. C/O constipation. ).    HPI:  The patient is a 52 y.o. old female who came in today for an acute visit with complaints of:    Constipation:  Patient states that for the past 2 weeks she has had constant aching generalized abdominal pain. She has not had a good bowel movement for 1 week, reports that typical bowel movements occur every other day. She states that she has had small, hard stools, but continues to feel bloated. She denies hematochezia, melena. She has lost weight due to watching her diet more closely, but reports a good appetite. She reports chronic nausea after gastric sleeve surgery, but denies worsening of her symptoms. Pertinent surgical history includes C-section x 3, cholecystectomy, and gastric sleeve. She locates the pain initially to her left flank, but denies dysuria, hematuria, urinary frequency, history of renal stones, or incomplete voiding.     ROS:  Constitutional: Denies unintentional weight gain/loss, fever, chills, fatigue, tiredness  Skin: Denies skin problems or rashes, itching  Musculoskeletal: Denies unilateral muscle weakness, atrophy, joint pain/swelling  HEENT: Denies blurred vision, vision loss, nasal congestion, otalgia, hearing loss, epistaxis, sore throat, difficulty swallowing  Cardiovascular: Denies heart valve problems/murmurs, irregular heartbeat, angina/chest pain, swelling in feet, swelling in hands, shortness of breath lying flat, short of breath climbing stairs, palpitations  Respiratory: Denies shortness of breath, productive cough, hemoptysis, wheezing  Gastrointestinal: +abdominal pain, constipation, nausea (chronic). Denies diarrhea, vomiting blood, black or tarry stools, GERD  Genitourinary: Denies hematuria,  change in urinary stream, urinary frequency, incontinence, dysuria, nocturia, sexual dysfunction  Neurologic: Denies headache, seizures, stroke, dizziness, loss of consciousness, paresthesias, numbness  Psychiatric: Denies anxiety, depression, memory loss, suicidal/homicidal thoughts, insomnia  Endocrine: Denies polydipsia, polyuria, thyroid problems, heat intolerance, cold intolerance    Past medical history:  Past Medical History:   Diagnosis Date   . Anxiety    . BMI 35.0-35.9,adult    . Carotid stenosis    . Chronic back pain    . Coronary artery disease    . DDD (degenerative disc disease), lumbar     "entire spine"   . Ear piercing    . GERD (gastroesophageal reflux disease)    . H/O complete eye exam     2 years, Dr. [name], at [facility]   . History of dental examination     dentures upper and lower   . HTN    . Hyperlipidemia    . Neck problem     herniated cervical disc   . Obesity    . 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 [place] ordered.    . Stroke (CMS [facility])     2002   . Tattoo     Left breast, Right shoulder   . Type 2 diabetes mellitus     Dx 1999 average fasting 70-89     Past surgical history:  Past Surgical History:   Procedure Laterality Date   . BYPASS GRAFT  2012    cardiac, 2 vessel, Dr. [name], [facility]   . ENDOMETRIAL ABLATION  1998   . HX ANKLE FRACTURE TX      Right, Doran Durand procedure   .  HX CATARACT REMOVAL      r and l eyes with implants   . HX CESAREAN SECTION  06/20/2000    x3, 11/21/86, 11/10/84   . HX CHOLECYSTECTOMY  1988   . HX CORONARY STENT PLACEMENT  2012    x4 stents   . HX GASTRIC SLEEVE  02/2017    [place], [place], Dr. Pearlie Oyster   . HX TONSILLECTOMY      as child     Medications:  Current Outpatient Medications   Medication Sig   . ACCU-CHEK AVIVA PLUS TEST STRP Strip 1 Strip by Subcutaneous route Three times a day as needed   . BYDUREON 2 mg Subcutaneous 2 mg by Subcutaneous route Every 7 days   . clopidogrel (PLAVIX) 75  mg Oral Tablet Take 1 Tab (75 mg total) by mouth Once a day   . cyanocobalamin (VITAMIN B12) 1,000 mcg/mL Injection Solution 1 mL (1,000 mcg total) by Subcutaneous route Every 30 days   . Dextromethorphan-Guaifenesin 15-100 mg/5 mL Oral Syrup Take 10 mL by mouth Every 6 hours as needed for Cough (Patient not taking: Reported on 02/15/2018)   . diclofenac sodium (VOLTAREN) 1 % Gel 2 g by Apply Topically route Four times a day - before meals and bedtime   . docusate sodium (COLACE) 100 mg Oral Capsule Take 1 Cap (100 mg total) by mouth Twice daily   . ergocalciferol, vitamin D2, (DRISDOL) 50,000 unit Oral Capsule Take 50,000 Units by mouth Every 7 days   . flash glucose scanning reader (FREESTYLE LIBRE 14 DAY READER) Does not apply Misc For daily use for 14 days.   . flash glucose sensor (FREESTYLE LIBRE 14 DAY SENSOR) Does not apply Kit For daily use for 14 days.   . furosemide (LASIX) 40 mg Oral Tablet Take 1 Tab (40 mg total) by mouth Once a day   . HYDROcodone-acetaminophen (NORCO) 10-325 mg Oral Tablet Take 1 Tab by mouth Every 4 hours as needed   . Ibuprofen (MOTRIN) 800 mg Oral Tablet Take 800 mg by mouth Three times a day as needed for Pain   . lisinopril (PRINIVIL) 2.5 mg Oral Tablet take 1 tablet by mouth once daily   . loratadine (CLARITIN) 10 mg Oral Tablet Take 1 Tab (10 mg total) by mouth Once a day   . metFORMIN (GLUCOPHAGE) 500 mg Oral Tablet Take 1 Tab (500 mg total) by mouth Twice daily with food   . metoprolol succinate (TOPROL-XL) 25 mg Oral Tablet Sustained Release 24 hr Take 0.5 Tabs (12.5 mg total) by mouth Once a day   . niacin (NIASPAN) 500 mg Oral Tablet Sustained Release Take 1 Tab (500 mg total) by mouth Once a day   . nitroGLYCERIN (NITROSTAT) 0.4 mg Sublingual Tablet, Sublingual 1 Tab (0.4 mg total) by Sublingual route Every 5 minutes as needed for Chest pain for 3 doses over 15 minutes   . omeprazole (PRILOSEC) 40 mg Oral Capsule, Delayed Release(E.C.) take 1 capsule by mouth once daily      . ondansetron (ZOFRAN ODT) 8 mg Oral Tablet, Rapid Dissolve Take 1 Tab (8 mg total) by mouth Every 8 hours as needed for nausea/vomiting   . PRENATAL PLUS, CALCIUM CARB, 27 mg iron- 1 mg Oral Tablet Take 1 Tab by mouth Once a day   . raNITIdine (ZANTAC) 150 mg Oral Tablet Take 1 Tab (150 mg total) by mouth Twice daily   . rosuvastatin (CRESTOR) 20 mg Oral Tablet Take 1 Tab (20 mg total)  by mouth Once a day   . VENTOLIN HFA 90 mcg/actuation Inhalation HFA Aerosol Inhaler Take 1-2 Puffs by inhalation Every 6 hours as needed (wheezing, cough)     Allergies:  Allergies   Allergen Reactions   . No Known Drug Allergies      Family history:  Family Medical History:     Problem Relation (Age of Onset)    Atrial fibrillation Sister    Colon Cancer Father    Diabetes Mother    Heart Attack Mother    Heart Disease Sister, Maternal Grandfather    Hypertension (High Blood Pressure) Mother    Lung Cancer Maternal Grandfather        Social history:  Social History     Socioeconomic History   . Marital status: Married     Spouse name: [name]   . Number of children: 3   . Years of education: completed 11th grade   . Highest education level: Not on file   Occupational History   . Occupation: disabled   Tobacco Use   . Smoking status: Current Every Day Smoker     Packs/day: 0.25     Years: 20.00     Pack years: 5.00     Types: Cigarettes     Last attempt to quit: 03/30/2011     Years since quitting: 7.0   . Smokeless tobacco: Never Used   Substance and Sexual Activity   . Alcohol use: No   . Drug use: No   . Sexual activity: Yes     Partners: Male   Other Topics Concern   . Routine Exercise No   . Ability to Walk 2 Flight of Steps without SOB/CP Yes   Social History Narrative    Living situation: lives at home with husband, [name], and daughter.     Nutrition:  Eats small meals d/t sleeve surgery.     Caffeine use: none, decaf coffee    Exercise: stays busy around house.     Seatbelt use: yes    Fire extinguishers in the home: yes     Smoke alarms in the home: yes    Carbon monoxide detectors in the home: yes    Nancy Fetter exposure:        Marielis would like for husband, Dior Dominik,  to speak for her in the event she would become incapacitated.    Full code.      Vitals:    04/15/18 1354   BP: 106/65   Pulse: 82   Resp: 16   Temp: 36.9 C (98.4 F)   SpO2: 97%   Weight: 86.2 kg (190 lb)   Height: 1.676 m (5' 6")   BMI: 30.73     Physical Examination:  GENERAL: Pt is a pleasant, well-nourished, well-developed 52 y.o. female who is in NAD. Appears stated age  73:  head normocephalic, symmetrical facies. EOM intact b/l. PERRLA. Sclera non-icteric, non-injected. No rhinorrhea. Oropharyngeal mucous membranes are moist  w/o erythema/exudates   NECK: supple. No masses, lymphadenopathy, JVD on exam. Trachea midline, no thyromegaly   CV: S1, S2. No murmurs, rubs, gallops.     LUNGS: CTAB, No rhonchi, rales, wheezes.   GI: (+) hyperactive BS in all 4 quadrants. soft, ND. +mildly tender to palpation diffusely. No rigidity/ guarding/ rebound. No organomegaly/masses. Left sided CVA tenderness noted.   MSK: Spontaneous normal AROM of major joints as observed during exam. No joint effusions/ swelling/ deformities.   No BLE edema, clubbing, or cyanosis.  2+ radial pulse present b/l.   + 2 reflexes Brachioradialis and patellar bilaterally.   Gait normal.  SKIN: No significant lesions, rashes, ecchymoses noted.   NEURO: AAOx4, CN grossly intact. Sensation intact b/l. No focal deficits.  PSYCH: Mood, behavior and affect normal w/ Intact judgement & insight     MRI thoracic spine  Result date: 4/32/19      Visit Diagnosis  Constipation, unspecified constipation type  Nausea  Differential includes opioid-induced constipation and renal stone given CVA tenderness.   XR KUB and UA ordered, as well as CBC and BMP.  She has been able to have small bowel movements. Discussed worsening signs/symptoms of SBO, but felt unlikely at this time.   Start Colace 100 mg BID.  Start  Miralax daily.     Thyroid nodule  Reviewed last thyroid labs and results of recent MRI of the thoracic spine with incidental findings.   [place] ordered.   She denies family history of thyroid cancer, excessive radiation exposure to the head/neck.  Consider FNA and uptake study.     Orders Placed This Encounter   . XR KUB   . US THYROID   . BASIC METABOLIC PANEL   . URINALYSIS, MACROSCOPIC   . docusate sodium (COLACE) 100 mg Oral Capsule   . ondansetron (ZOFRAN ODT) 8 mg Oral Tablet, Rapid Dissolve     Return if symptoms worsen or fail to improve.    Chryl Heck, PA-C  04/15/2018, 15:07  Electronically signed by Chryl Heck, PA-C

## 2018-04-15 NOTE — Patient Instructions (Signed)
Common Thyroid Problems    The thyroid is a gland in the neck. It makes thyroid hormone. A hormone is a chemical messenger for the body. If there is a problem with the thyroid, the level of thyroid hormone may change. This can lead to symptoms.  Hypothyroidism  This is when the thyroid gland doesn't make enough hormone. The most common cause of hypothyroidism is Hashimoto thyroiditis. This occurs when the body's immune system attacks the thyroid gland. Hypothyroidism may also occur if there'sa severe deficiency of iodine in the body. The thyroid needs iodine to make hormone. Problems with the pituitary gland can lead to not enough production of thyroid hormone. Hypothyroidism will also occur if the thyroid gland is removed during surgery.  Common symptoms include:   Low energy and tiredness   Depression   Feeling cold   Muscle pain   Slowed thinking    Constipation   Heavier menstrual periods with prolonged bleeding   Weight gain   Dry and brittle skin, hair, nails   Excessive sleepiness  Hyperthyroidism  This is when the thyroid gland makes too much hormone. The most common cause is Graves disease. This is due to the body's immune system telling the thyroid to make too much hormone. Another cause is a small bump (nodule) in the thyroid gland. This can cause hyperthyroidism if the cells in the nodule make too much thyroid hormone and stop hormone production in the rest of the thyroid gland.  Common symptoms include:   Shaking, nervousness, irritability   Feeling hot   A rapid, irregular heartbeat   Muscle weakness, fatigue   More frequent bowel movements   Shorter, lighter menstrual periods   Weight loss   Hair loss   Bulging eyes  Nodules  Nodules are lumps of tissue in the thyroid gland. Most often, the cause of nodules isn't known. But they may be more common in people who've had medical radiation to the head or neck. Sometimes nodules can be felt on the outside of the neck. Most of the  time, cause no symptoms and don't affect the amount of thyroid hormone. Most nodules are not cancer. But sometimes a nodule may be cancer.  What is a goiter?  A goiter is the enlargement of the thyroid gland. When the gland enlarges, you may see or feel a swelling on your neck. A goiter can develop in someone with a normal thyroid, overactive thyroid, or underactive thyroid.   Date Last Reviewed: 10/09/2015   2000-2018 [employer]. [address] [place] [address]. All rights reserved. This information is not intended as a substitute for professional medical care. Always follow your healthcare professional's instructions.

## 2018-04-16 ENCOUNTER — Telehealth (HOSPITAL_BASED_OUTPATIENT_CLINIC_OR_DEPARTMENT_OTHER): Payer: Self-pay | Admitting: Family Medicine

## 2018-04-16 NOTE — Telephone Encounter (Signed)
-----   Message from Chryl Heck, PA-C sent at 04/16/2018  1:19 PM EDT -----  Please inform patient that her XR showed a moderate amount of stool but no obstruction.   No obvious kidney stones.   If symptoms persist, we will consider further testing for kidney stones.   We will review these together at our next encounter.  TY LA

## 2018-04-26 ENCOUNTER — Ambulatory Visit (HOSPITAL_BASED_OUTPATIENT_CLINIC_OR_DEPARTMENT_OTHER): Payer: Commercial Managed Care - PPO

## 2018-04-29 ENCOUNTER — Ambulatory Visit
Admission: RE | Admit: 2018-04-29 | Discharge: 2018-04-29 | Disposition: A | Discharge status: Home / Self Care | Payer: Commercial Managed Care - PPO | Source: Ambulatory Visit | Attending: PHYSICIAN ASSISTANT | Admitting: PHYSICIAN ASSISTANT

## 2018-04-29 DIAGNOSIS — E041 Nontoxic single thyroid nodule: Secondary | ICD-10-CM | POA: Insufficient documentation

## 2018-04-30 ENCOUNTER — Other Ambulatory Visit (HOSPITAL_BASED_OUTPATIENT_CLINIC_OR_DEPARTMENT_OTHER): Payer: Self-pay | Admitting: PHYSICIAN ASSISTANT

## 2018-04-30 ENCOUNTER — Telehealth (HOSPITAL_BASED_OUTPATIENT_CLINIC_OR_DEPARTMENT_OTHER): Payer: Self-pay | Admitting: Family Medicine

## 2018-04-30 DIAGNOSIS — E041 Nontoxic single thyroid nodule: Secondary | ICD-10-CM

## 2018-04-30 NOTE — Telephone Encounter (Addendum)
Patient notified of results. Will call back next week if we have not got back with her. Patient voiced understanding.----- Message from Chryl Heck, PA-C sent at 04/30/2018 12:47 PM EDT -----  Please inform patient that her US showed a 2.1 cm nodule on the right.   I am ordering for her to have a biopsy, she should notify our office if she has not had this scheduled by the end of next week.   We will review these together at our next encounter.

## 2018-05-05 ENCOUNTER — Other Ambulatory Visit (HOSPITAL_BASED_OUTPATIENT_CLINIC_OR_DEPARTMENT_OTHER): Payer: Self-pay | Admitting: Family Medicine

## 2018-05-05 DIAGNOSIS — Z01818 Encounter for other preprocedural examination: Secondary | ICD-10-CM

## 2018-05-05 DIAGNOSIS — Z419 Encounter for procedure for purposes other than remedying health state, unspecified: Secondary | ICD-10-CM

## 2018-05-05 DIAGNOSIS — I6529 Occlusion and stenosis of unspecified carotid artery: Secondary | ICD-10-CM

## 2018-05-05 DIAGNOSIS — E041 Nontoxic single thyroid nodule: Secondary | ICD-10-CM

## 2018-05-09 ENCOUNTER — Encounter: Payer: Self-pay | Admitting: Gastroenterology

## 2018-05-11 ENCOUNTER — Encounter: Payer: Self-pay | Admitting: Gastroenterology

## 2018-05-13 ENCOUNTER — Ambulatory Visit
Admission: RE | Admit: 2018-05-13 | Discharge: 2018-05-13 | Disposition: A | Discharge status: Home / Self Care | Payer: Commercial Managed Care - PPO | Source: Ambulatory Visit

## 2018-05-13 ENCOUNTER — Ambulatory Visit: Payer: Commercial Managed Care - PPO | Attending: Family Medicine

## 2018-05-13 DIAGNOSIS — E041 Nontoxic single thyroid nodule: Principal | ICD-10-CM | POA: Insufficient documentation

## 2018-05-13 DIAGNOSIS — I6529 Occlusion and stenosis of unspecified carotid artery: Secondary | ICD-10-CM

## 2018-05-13 DIAGNOSIS — Z419 Encounter for procedure for purposes other than remedying health state, unspecified: Secondary | ICD-10-CM | POA: Insufficient documentation

## 2018-05-13 LAB — PTT (PARTIAL THROMBOPLASTIN TIME): APTT: 30.5 seconds (ref 26.0–39.0)

## 2018-05-13 LAB — PT/INR
INR: 0.91 (ref <=4.00)
PROTHROMBIN TIME: 10.5 seconds (ref 9.7–13.6)

## 2018-05-14 ENCOUNTER — Encounter (HOSPITAL_BASED_OUTPATIENT_CLINIC_OR_DEPARTMENT_OTHER): Payer: Self-pay | Admitting: Family Medicine

## 2018-05-14 ENCOUNTER — Telehealth (HOSPITAL_BASED_OUTPATIENT_CLINIC_OR_DEPARTMENT_OTHER): Payer: Self-pay | Admitting: Family Medicine

## 2018-05-14 NOTE — Telephone Encounter (Signed)
-----   Message from Chryl Heck, PA-C sent at 05/14/2018  4:13 PM EDT -----  Please inform patient that her blood clotting tests were normal.

## 2018-05-14 NOTE — Telephone Encounter (Signed)
PATIENT WAS NOTIFIED

## 2018-05-17 ENCOUNTER — Telehealth (HOSPITAL_BASED_OUTPATIENT_CLINIC_OR_DEPARTMENT_OTHER): Payer: Self-pay | Admitting: Family Medicine

## 2018-05-17 DIAGNOSIS — E041 Nontoxic single thyroid nodule: Secondary | ICD-10-CM

## 2018-05-17 NOTE — Telephone Encounter (Signed)
PATIENT IS WONDERING IF SHE CAN HAVE A THYROID UPTAKE INSTEAD OF A BIOPSY??

## 2018-05-19 NOTE — Telephone Encounter (Signed)
Order placed for uptake scan. If positive, we will need to proceed with biopsy.     Chryl Heck, PA-C  05/19/2018, 15:18

## 2018-05-19 NOTE — Telephone Encounter (Signed)
patient was notified.

## 2018-05-25 ENCOUNTER — Ambulatory Visit
Admission: RE | Admit: 2018-05-25 | Discharge: 2018-05-25 | Disposition: A | Discharge status: Home / Self Care | Payer: Commercial Managed Care - PPO | Source: Ambulatory Visit | Attending: PHYSICIAN ASSISTANT | Admitting: PHYSICIAN ASSISTANT

## 2018-05-25 ENCOUNTER — Ambulatory Visit (HOSPITAL_COMMUNITY): Payer: Commercial Managed Care - PPO | Admitting: Radiology

## 2018-05-25 DIAGNOSIS — E041 Nontoxic single thyroid nodule: Secondary | ICD-10-CM | POA: Insufficient documentation

## 2018-05-26 ENCOUNTER — Ambulatory Visit
Admission: RE | Admit: 2018-05-26 | Discharge: 2018-05-26 | Disposition: A | Discharge status: Home / Self Care | Payer: Commercial Managed Care - PPO | Source: Ambulatory Visit | Attending: PHYSICIAN ASSISTANT | Admitting: PHYSICIAN ASSISTANT

## 2018-05-31 ENCOUNTER — Telehealth (HOSPITAL_BASED_OUTPATIENT_CLINIC_OR_DEPARTMENT_OTHER): Payer: Self-pay | Admitting: Family Medicine

## 2018-05-31 ENCOUNTER — Ambulatory Visit (HOSPITAL_COMMUNITY): Payer: Commercial Managed Care - PPO

## 2018-05-31 DIAGNOSIS — E041 Nontoxic single thyroid nodule: Secondary | ICD-10-CM

## 2018-05-31 NOTE — Telephone Encounter (Signed)
-----   Message from Chryl Heck, PA-C sent at 05/26/2018  5:04 PM EDT -----  Please inform patient that her uptake scan was not conclusive as to the metabolic activity of this nodule in the right thyroid.   I would recommend proceeding with FNA. Let me know if she is willing and I'll place order.   We will review these together at our next encounter.

## 2018-05-31 NOTE — Telephone Encounter (Signed)
patient was notified of results and is willing to go on with the FNP.

## 2018-05-31 NOTE — Telephone Encounter (Signed)
patient was notified that the order was placed and she should call if she does not hear from someone this week to schedule.

## 2018-05-31 NOTE — Telephone Encounter (Signed)
Order placed. Tell her to call our office if she does not hear of this being scheduled in the next week.     Chryl Heck, PA-C  05/31/2018, 13:25

## 2018-06-03 ENCOUNTER — Other Ambulatory Visit (HOSPITAL_BASED_OUTPATIENT_CLINIC_OR_DEPARTMENT_OTHER): Payer: Self-pay | Admitting: Cardiovascular Disease

## 2018-06-03 MED ORDER — ROSUVASTATIN 20 MG TABLET
20.0000 mg | ORAL_TABLET | Freq: Every day | ORAL | 0 refills | Status: DC
Start: 2018-06-03 — End: 2018-07-20

## 2018-06-03 MED ORDER — LISINOPRIL 2.5 MG TABLET
ORAL_TABLET | ORAL | 0 refills | Status: DC
Start: 2018-06-03 — End: 2018-10-31

## 2018-06-17 ENCOUNTER — Other Ambulatory Visit (HOSPITAL_COMMUNITY): Payer: Self-pay

## 2018-06-21 ENCOUNTER — Ambulatory Visit (HOSPITAL_COMMUNITY): Payer: Commercial Managed Care - PPO

## 2018-07-13 ENCOUNTER — Ambulatory Visit (INDEPENDENT_AMBULATORY_CARE_PROVIDER_SITE_OTHER): Payer: Commercial Managed Care - PPO | Admitting: Family Medicine

## 2018-07-13 ENCOUNTER — Encounter (HOSPITAL_BASED_OUTPATIENT_CLINIC_OR_DEPARTMENT_OTHER): Payer: Self-pay | Admitting: Family Medicine

## 2018-07-13 DIAGNOSIS — E538 Deficiency of other specified B group vitamins: Secondary | ICD-10-CM

## 2018-07-13 DIAGNOSIS — E118 Type 2 diabetes mellitus with unspecified complications: Secondary | ICD-10-CM

## 2018-07-13 DIAGNOSIS — K219 Gastro-esophageal reflux disease without esophagitis: Secondary | ICD-10-CM

## 2018-07-13 DIAGNOSIS — R52 Pain, unspecified: Secondary | ICD-10-CM

## 2018-07-13 DIAGNOSIS — R110 Nausea: Secondary | ICD-10-CM

## 2018-07-13 DIAGNOSIS — K5900 Constipation, unspecified: Secondary | ICD-10-CM

## 2018-07-13 DIAGNOSIS — J209 Acute bronchitis, unspecified: Secondary | ICD-10-CM

## 2018-07-13 MED ORDER — ERGOCALCIFEROL (VITAMIN D2) 1,250 MCG (50,000 UNIT) CAPSULE
50000.00 [IU] | ORAL_CAPSULE | ORAL | 1 refills | Status: DC
Start: 2018-07-13 — End: 2018-10-05

## 2018-07-13 MED ORDER — NIACIN ER 500 MG TABLET,EXTENDED RELEASE
500.0000 mg | ORAL_TABLET | Freq: Every day | ORAL | 3 refills | Status: DC
Start: 2018-07-13 — End: 2019-03-01

## 2018-07-13 MED ORDER — DICLOFENAC 1 % TOPICAL GEL
2.00 g | Freq: Four times a day (QID) | CUTANEOUS | 3 refills | Status: DC
Start: 2018-07-13 — End: 2018-11-10

## 2018-07-13 MED ORDER — ONDANSETRON 8 MG DISINTEGRATING TABLET
8.00 mg | ORAL_TABLET | Freq: Three times a day (TID) | ORAL | 1 refills | Status: DC | PRN
Start: 2018-07-13 — End: 2018-11-17

## 2018-07-13 MED ORDER — IBUPROFEN 800 MG TABLET
800.00 mg | ORAL_TABLET | Freq: Three times a day (TID) | ORAL | 0 refills | Status: DC | PRN
Start: 2018-07-13 — End: 2018-11-10

## 2018-07-13 MED ORDER — METFORMIN 500 MG TABLET
500.00 mg | ORAL_TABLET | Freq: Two times a day (BID) | ORAL | 2 refills | Status: DC
Start: 2018-07-13 — End: 2019-01-18

## 2018-07-13 MED ORDER — CYANOCOBALAMIN (VIT B-12) 1,000 MCG/ML INJECTION SOLUTION
1000.00 ug | INTRAMUSCULAR | 0 refills | Status: DC
Start: 2018-07-13 — End: 2018-10-05

## 2018-07-13 MED ORDER — BYDUREON 2 MG SUBCUTANEOUS EXTENDED RELEASE SUSPENSION
2.00 mg | SUBCUTANEOUS | 3 refills | Status: DC
Start: 2018-07-13 — End: 2018-10-05

## 2018-07-13 MED ORDER — VENTOLIN HFA 90 MCG/ACTUATION AEROSOL INHALER
1.00 | INHALATION_SPRAY | Freq: Four times a day (QID) | RESPIRATORY_TRACT | 1 refills | Status: DC | PRN
Start: 2018-07-13 — End: 2018-11-06

## 2018-07-13 MED ORDER — OMEPRAZOLE 40 MG CAPSULE,DELAYED RELEASE
40.0000 mg | DELAYED_RELEASE_CAPSULE | Freq: Every day | ORAL | 2 refills | Status: DC
Start: 2018-07-13 — End: 2018-12-02

## 2018-07-13 MED ORDER — DOCUSATE SODIUM 100 MG CAPSULE
100.00 mg | ORAL_CAPSULE | Freq: Two times a day (BID) | ORAL | 2 refills | Status: DC
Start: 2018-07-13 — End: 2019-03-01

## 2018-07-13 NOTE — Student (Signed)
Patient Name: Monique [name]  Date: 07/13/2018  [place], [place]  [address]  [place] [address]  [phone]  MRN: [id number]  DOB: 02/26/1966    Chief complaint: The patient is a 52 y.o. old female who came in today for Diabetes Mellitus II and gastric bypass f/u.    HPI:    DM II  -Pt last A1C was 9.6. Pt A1C today in office was 9.3. Patient last recorded glucose was 190. Pt states that she has been stress eating for the past month due to fathers hospitalization.  Pt has not been regularly monitoring glucose since fathers hospitalization a month ago. Pt forgot to pick up her LIBRE device.     Gastric Bypass f/u  -B 12 Injection. Previous lab showed a decreased total protein of 5.6 and albumin of 3.2. Pt states that she has been drinking a protein supplement. Pt states that she has been adhering to her gastric surgery diet and supplementation. Pt denies any irregularities in bowel function since resolution of previous constipation on 04/15/2018.      ROS:  Constitutional: No fever, chills  Skin: No rashes or lesions  HENT: No sore throat, ear pain, or difficulty swallowing  Eyes: No vision changes, redness, discharge  Cardio: No chest pain, palpitations   Respiratory: No cough, wheezing or SOB  GI:  No nausea or vomiting. No diarrhea or constipation. No abdominal pain  GU:  No dysuria, hematuria, polyuria  MSK: No new joint pain.  No new neck or back pain  Neuro: No numbness, tingling, or weakness.  No headache  All other systems reviewed and are negative, unless commented on in the HPI.       Past medical history:  Past Medical History:   Diagnosis Date   . Anxiety    . BMI 35.0-35.9,adult    . Carotid stenosis    . Chronic back pain    . Coronary artery disease    . DDD (degenerative disc disease), lumbar     "entire spine"   . Ear piercing    . GERD (gastroesophageal reflux disease)    . H/O complete eye exam     2 years, Dr. [name], at [facility]   . History of  dental examination     dentures upper and lower   . HTN    . Hyperlipidemia    . Neck problem     herniated cervical disc   . Obesity    . 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 [place] ordered.    . Stroke ([facility])     2002   . Tattoo     Left breast, Right shoulder   . Type 2 diabetes mellitus     Dx 1999 average fasting 70-89       Past surgical history:  Past Surgical History:   Procedure Laterality Date   . BYPASS GRAFT  2012    cardiac, 2 vessel, Dr. [name], [facility]   . ENDOMETRIAL ABLATION  1998   . HX ANKLE FRACTURE TX      Right, Doran Durand procedure   . HX CATARACT REMOVAL      r and l eyes with implants   . HX CESAREAN SECTION  06/20/2000    x3, 11/21/86, 11/10/84   . HX CHOLECYSTECTOMY  1988   . HX CORONARY STENT PLACEMENT  2012    x4  stents   . HX GASTRIC SLEEVE  02/2017    [place], [place], Dr. Pearlie Oyster   . HX TONSILLECTOMY      as child       Medications:  Current Outpatient Medications   Medication Sig   . ACCU-CHEK AVIVA PLUS TEST STRP Strip 1 Strip by Subcutaneous route Three times a day as needed   . BYDUREON 2 mg Subcutaneous 2 mg by Subcutaneous route Every 7 days   . clopidogrel (PLAVIX) 75 mg Oral Tablet Take 1 Tab (75 mg total) by mouth Once a day   . cyanocobalamin (VITAMIN B12) 1,000 mcg/mL Injection Solution 1 mL (1,000 mcg total) by Subcutaneous route Every 30 days   . diclofenac sodium (VOLTAREN) 1 % Gel 2 g by Apply Topically route Four times a day - before meals and bedtime   . docusate sodium (COLACE) 100 mg Oral Capsule Take 1 Cap (100 mg total) by mouth Twice daily   . ergocalciferol, vitamin D2, (DRISDOL) 50,000 unit Oral Capsule Take 50,000 Units by mouth Every 7 days   . flash glucose scanning reader (FREESTYLE LIBRE 14 DAY READER) Does not apply Misc For daily use for 14 days.   . flash glucose sensor (FREESTYLE LIBRE 14 DAY SENSOR) Does not apply Kit For daily use for 14 days.   . furosemide (LASIX) 40 mg Oral Tablet  Take 1 Tab (40 mg total) by mouth Once a day   . HYDROcodone-acetaminophen (NORCO) 10-325 mg Oral Tablet Take 1 Tab by mouth Every 4 hours as needed   . Ibuprofen (MOTRIN) 800 mg Oral Tablet Take 800 mg by mouth Three times a day as needed for Pain   . lisinopril (PRINIVIL) 2.5 mg Oral Tablet take 1 tablet by mouth once daily   . loratadine (CLARITIN) 10 mg Oral Tablet Take 1 Tab (10 mg total) by mouth Once a day   . metFORMIN (GLUCOPHAGE) 500 mg Oral Tablet Take 1 Tab (500 mg total) by mouth Twice daily with food   . metoprolol succinate (TOPROL-XL) 25 mg Oral Tablet Sustained Release 24 hr Take 0.5 Tabs (12.5 mg total) by mouth Once a day   . niacin (NIASPAN) 500 mg Oral Tablet Sustained Release Take 1 Tab (500 mg total) by mouth Once a day   . nitroGLYCERIN (NITROSTAT) 0.4 mg Sublingual Tablet, Sublingual 1 Tab (0.4 mg total) by Sublingual route Every 5 minutes as needed for Chest pain for 3 doses over 15 minutes   . omeprazole (PRILOSEC) 40 mg Oral Capsule, Delayed Release(E.C.) take 1 capsule by mouth once daily   . ondansetron (ZOFRAN ODT) 8 mg Oral Tablet, Rapid Dissolve Take 1 Tab (8 mg total) by mouth Every 8 hours as needed for nausea/vomiting   . PRENATAL PLUS, CALCIUM CARB, 27 mg iron- 1 mg Oral Tablet Take 1 Tab by mouth Once a day   . raNITIdine (ZANTAC) 150 mg Oral Tablet Take 1 Tab (150 mg total) by mouth Twice daily   . rosuvastatin (CRESTOR) 20 mg Oral Tablet Take 1 Tab (20 mg total) by mouth Once a day   . VENTOLIN HFA 90 mcg/actuation Inhalation HFA Aerosol Inhaler Take 1-2 Puffs by inhalation Every 6 hours as needed (wheezing, cough)     Allergies:  Allergies   Allergen Reactions   . No Known Drug Allergies      Family history:  Family Medical History:     Problem Relation (Age of Onset)    Atrial fibrillation Sister    Colon  Cancer Father    Diabetes Mother    Heart Attack Mother    Heart Disease Sister, Maternal Grandfather    Hypertension (High Blood Pressure) Mother    Lung Cancer Maternal  Grandfather        Social history:  Social History     Socioeconomic History   . Marital status: Married     Spouse name: [name]   . Number of children: 3   . Years of education: completed 11th grade   . Highest education level: Not on file   Occupational History   . Occupation: disabled   Tobacco Use   . Smoking status: Current Every Day Smoker     Packs/day: 0.25     Years: 20.00     Pack years: 5.00     Types: Cigarettes     Last attempt to quit: 03/30/2011     Years since quitting: 7.2   . Smokeless tobacco: Never Used   Substance and Sexual Activity   . Alcohol use: No   . Drug use: No   . Sexual activity: Yes     Partners: Male   Other Topics Concern   . Routine Exercise No   . Ability to Walk 2 Flight of Steps without SOB/CP Yes   Social History Narrative    Living situation: lives at home with husband, [name], and daughter.     Nutrition:  Eats small meals d/t sleeve surgery.     Caffeine use: none, decaf coffee    Exercise: stays busy around house.     Seatbelt use: yes    Fire extinguishers in the home: yes    Smoke alarms in the home: yes    Carbon monoxide detectors in the home: yes    Nancy Fetter exposure:        Loreen would like for husband, Timarie Labell,  to speak for her in the event she would become incapacitated.    Full code.        Vitals:    07/13/18 0936   BP: 110/70   Pulse: 67   Resp: 12   Temp: 35.9 C (96.7 F)   TempSrc: Tympanic   SpO2: 99%   Weight: 96.2 kg (212 lb)   Height: 1.676 m (_0 )   BMI: 34.29       Physical Examination:    GENERAL:   Pt is a pleasant, well-nourished, well-developed 52 y.o. female who is in NAD. Appears stated age  52:  head normocephalic, symmetrical facies. EOM intact b/l. PERRLA. Sclera non-icteric, non-injected. No rhinorrhea. Oropharyngeal mucous membranes are moist  w/o erythema/exudates   NECK: supple. No masses, lymphadenopathy, JVD on exam. Trachea midline, no thyromegaly   CV: S1, S2. No murmurs, rubs, gallops.     LUNGS: CTAB, No rhonchi, rales, wheezes.      GI: (+) BS in all 4 quadrants. soft, NT/ND. No rigidity/ guarding/ rebound. No organomegaly/masses, or abdominal bruits  MSK: Spontaneous normal AROM of major joints as observed during exam. No joint effusions/ swelling/ deformities.   No BLE edema, clubbing, or cyanosis.   2+ radial pulse present b/l.   + 2 reflexes Brachioradialis and patellar bilaterally.   Gait normal.  SKIN: No significant lesions, rashes, ecchymoses noted.   NEURO: AAOx4, CN grossly intact. Sensation intact b/l. No focal deficits.  PSYCH: Mood, behavior and affect normal w/ Intact judgement & insight       Diabetes Mellitus II  Type II Diabetes  New onset, Chronic, Controlled  problem Uncontrolled problem  Encourage therapeutic lifestyle changes.  Encourage regular exercise program.  Referral for diabetic educator.   Referral for dietary counseling.  Smoking cessation.  Continue biguanide (metformin).  Continue insulin.  Continue ACE inhibitor.  Continue statin.  Further evaluation: Home glucose monitoring, Glucose tolerance test, HgbA1c, BMP, Urine Microalbuminuria and Urine Creatinine, FLP, ECG, Exercise stress test, Endocrine referral, Renal referral, Podiatry referral, Optometry referral.    Hypertension  Chronic, controlled.  Continue behavioral efforts, esp  Cardiac friendly diet,   < 2 gm salt q day.   Exercise 20 min q day most days of the week.  Continue BB  Continue HCTZ  Continue ACEI  Continue CCB    Gastric Bypass  Continue gastric bypass diet  Continue supplementation  Labs:   Prealbumin to assess absorption   CMP   CBC    Tobacco use  Discuss cessation    Loura Pardon, MED STUDENT  07/13/2018, 10:25  Electronically signed by Loura Pardon, MED STUDENT    This note was partially generated using MModal Fluency Direct system, and there may be some incorrect words, spellings, and punctuation that were not noted in checking the note before saving.    Cherly Beach, MD  07/14/2018, 08:00

## 2018-07-13 NOTE — Nursing Note (Signed)
07/13/18 0900   Medication Administration   Initials CG   Medication  Cyanocobalamin B-12   Medication Dose 90mL   Route of Administration IM   Site Left Deltoid   NDC # [id number]   LOT # [id number]   Expiration date 02/03/20   [facility] Supplied No   Patient Supplied Yes

## 2018-07-13 NOTE — Progress Notes (Signed)
Patient Name: Monique [name]  Date: 07/13/2018  [place], [place]  [address]  [place] [address]  [phone]  MRN: [id number]  DOB: March 18, 1966    Chief complaint:The patient is a 52 y.o. old female who came in today for a 3 month follow-up of Diabetes (Pt last A1C was 9.6. Pt A1C today in office was 9.3) and B 12 Injection.    HPI:    Pt's father has been in hospital for > 1 month, this week D/C home on hospice. She endorses poor diet while she was visitor in hospital.     Type II Diabetes mellitus: Last HbA1C on 11/16/2017 was 9.3% previously. Current medications include Bydureon 2 mg SQ weekly. She was previously restarted on metformin which was held after weight loss surgery. She reports checking blood sugars at home sporadically, sometimes having readings > 400. She did not get her CBG reader.  She denies chest pain, dyspnea. She has chronic nausea, but attributes this to gastric bypass surgery. She admits to eating carbohydrates and sugars occasionally, but states that she tries to limit her portions.     Weight Loss: No changes in management were made at the last visit. Patient reports weight loss of almost 100 lbs since gastric bypass surgery on 03/04/17 at [facility] by Dr. Vernard Gambles. Symptoms include anorexia and decreased food intake, while symptoms do not include dysphagia, odynophagia, dysgeusia, diarrhea, or polyuria. Onset was gradual months ago. The patient describes this as unchanged.     Thoracic spine pain: Patient reports thoracic back pain that has been present and worsening over the past 3 years. She denies known injury at onset of pain. She describes the pain as sharp, stabbing (10/10 in intensity) and radiating in her lower back. She denies recent imaging for this problem. She denies numbness/paresthesias of the bilateral arms/legs. She takes Norco as prescribed by her pain management specialist in [place], [place], but states that the pain is still a  7/10 at rest constantly.        ROS:    Constitutional: No fever, chills  Skin: No rashes or lesions  HENT: No sore throat, ear pain, or difficulty swallowing  Eyes: No vision changes, redness, discharge  Cardio: No chest pain, palpitations   Respiratory: No cough, wheezing or SOB  GI:  No nausea or vomiting. No diarrhea or constipation. No abdominal pain  GU:  No dysuria, hematuria, polyuria  MSK: No new joint pain.  No new neck or back pain  Neuro: No numbness, tingling, or weakness.  No headache  All other systems reviewed and are negative, unless commented on in the HPI.       Past medical history:  Past Medical History:   Diagnosis Date   . Anxiety    . BMI 35.0-35.9,adult    . Carotid stenosis    . Chronic back pain    . Coronary artery disease    . DDD (degenerative disc disease), lumbar     "entire spine"   . Ear piercing    . GERD (gastroesophageal reflux disease)    . H/O complete eye exam     2 years, Dr. [name], at [facility]   . History of dental examination     dentures upper and lower   . HTN    . Hyperlipidemia    . Neck problem     herniated cervical disc   . Obesity    . 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 [place] ordered.    . Stroke (CMS [facility])     2002   . Tattoo     Left breast, Right shoulder   . Type 2 diabetes mellitus     Dx 1999 average fasting 70-89     Past surgical history:  Past Surgical History:   Procedure Laterality Date   . BYPASS GRAFT  2012    cardiac, 2 vessel, Dr. [name], [facility]   . ENDOMETRIAL ABLATION  1998   . HX ANKLE FRACTURE TX      Right, Doran Durand procedure   . HX CATARACT REMOVAL      r and l eyes with implants   . HX CESAREAN SECTION  06/20/2000    x3, 11/21/86, 11/10/84   . HX CHOLECYSTECTOMY  1988   . HX CORONARY STENT PLACEMENT  2012    x4 stents   . HX GASTRIC SLEEVE  02/2017    [place], [place], Dr. Pearlie Oyster   . HX TONSILLECTOMY      as child     Medications:  Current Outpatient Medications   Medication Sig   .  ACCU-CHEK AVIVA PLUS TEST STRP Strip 1 Strip by Subcutaneous route Three times a day as needed   . BYDUREON 2 mg Subcutaneous 2 mg by Subcutaneous route Every 7 days   . clopidogrel (PLAVIX) 75 mg Oral Tablet Take 1 Tab (75 mg total) by mouth Once a day   . cyanocobalamin (VITAMIN B12) 1,000 mcg/mL Injection Solution 1 mL (1,000 mcg total) by Subcutaneous route Every 30 days   . diclofenac sodium (VOLTAREN) 1 % Gel 2 g by Apply Topically route Four times a day - before meals and bedtime   . docusate sodium (COLACE) 100 mg Oral Capsule Take 1 Cap (100 mg total) by mouth Twice daily   . ergocalciferol, vitamin D2, (DRISDOL) 50,000 unit Oral Capsule Take 1 Cap (50,000 Units total) by mouth Every 7 days   . flash glucose scanning reader (FREESTYLE LIBRE 14 DAY READER) Does not apply Misc For daily use for 14 days.   . flash glucose sensor (FREESTYLE LIBRE 14 DAY SENSOR) Does not apply Kit For daily use for 14 days.   . furosemide (LASIX) 40 mg Oral Tablet Take 1 Tab (40 mg total) by mouth Once a day   . HYDROcodone-acetaminophen (NORCO) 10-325 mg Oral Tablet Take 1 Tab by mouth Every 4 hours as needed   . Ibuprofen (MOTRIN) 800 mg Oral Tablet Take 1 Tab (800 mg total) by mouth Three times a day as needed for Pain   . lisinopril (PRINIVIL) 2.5 mg Oral Tablet take 1 tablet by mouth once daily   . loratadine (CLARITIN) 10 mg Oral Tablet Take 1 Tab (10 mg total) by mouth Once a day   . metFORMIN (GLUCOPHAGE) 500 mg Oral Tablet Take 1 Tab (500 mg total) by mouth Twice daily with food   . metoprolol succinate (TOPROL-XL) 25 mg Oral Tablet Sustained Release 24 hr Take 0.5 Tabs (12.5 mg total) by mouth Once a day   . niacin (NIASPAN) 500 mg Oral Tablet Sustained Release Take 1 Tab (500 mg total) by mouth Once a day   . nitroGLYCERIN (NITROSTAT) 0.4 mg Sublingual Tablet, Sublingual 1 Tab (0.4 mg total) by Sublingual route Every 5 minutes as needed for Chest pain for 3 doses over 15 minutes   . omeprazole (PRILOSEC) 40 mg Oral  Capsule, Delayed Release(E.C.)  Take 1 Cap (40 mg total) by mouth Once a day   . ondansetron (ZOFRAN ODT) 8 mg Oral Tablet, Rapid Dissolve Take 1 Tab (8 mg total) by mouth Every 8 hours as needed for nausea/vomiting   . PRENATAL PLUS, CALCIUM CARB, 27 mg iron- 1 mg Oral Tablet Take 1 Tab by mouth Once a day   . raNITIdine (ZANTAC) 150 mg Oral Tablet Take 1 Tab (150 mg total) by mouth Twice daily   . rosuvastatin (CRESTOR) 20 mg Oral Tablet Take 1 Tab (20 mg total) by mouth Once a day   . VENTOLIN HFA 90 mcg/actuation Inhalation HFA Aerosol Inhaler Take 1-2 Puffs by inhalation Every 6 hours as needed (wheezing, cough)     Allergies:  Allergies   Allergen Reactions   . No Known Drug Allergies      Family history:  Family Medical History:     Problem Relation (Age of Onset)    Atrial fibrillation Sister    Colon Cancer Father    Diabetes Mother    Heart Attack Mother    Heart Disease Sister, Maternal Grandfather    Hypertension (High Blood Pressure) Mother    Lung Cancer Maternal Grandfather        Social history:  Social History     Socioeconomic History   . Marital status: Married     Spouse name: [name]   . Number of children: 3   . Years of education: completed 11th grade   . Highest education level: Not on file   Occupational History   . Occupation: disabled   Tobacco Use   . Smoking status: Current Every Day Smoker     Packs/day: 0.25     Years: 20.00     Pack years: 5.00     Types: Cigarettes     Last attempt to quit: 03/30/2011     Years since quitting: 7.2   . Smokeless tobacco: Never Used   Substance and Sexual Activity   . Alcohol use: No   . Drug use: No   . Sexual activity: Yes     Partners: Male   Other Topics Concern   . Routine Exercise No   . Ability to Walk 2 Flight of Steps without SOB/CP Yes   Social History Narrative    Living situation: lives at home with husband, [name], and daughter.     Nutrition:  Eats small meals d/t sleeve surgery.     Caffeine use: none, decaf coffee    Exercise: stays busy  around house.     Seatbelt use: yes    Fire extinguishers in the home: yes    Smoke alarms in the home: yes    Carbon monoxide detectors in the home: yes    Nancy Fetter exposure:        Bettymae would like for husband, Bibi Economos,  to speak for her in the event she would become incapacitated.    Full code.      Vitals:    07/13/18 0936   BP: 110/70   Pulse: 67   Resp: 12   Temp: 35.9 C (96.7 F)   TempSrc: Tympanic   SpO2: 99%   Weight: 96.2 kg (212 lb)   Height: 1.676 m (5' 6" )   BMI: 34.29           Physical Examination:  GENERAL:   Pt is a pleasant, well-nourished, well-developed 52 y.o. female who is in NAD. Appears stated age  55:  head normocephalic, symmetrical  facies. EOM intact b/l. PERRLA. Sclera non-icteric, non-injected. No rhinorrhea. Oropharyngeal mucous membranes are moist  w/o erythema/exudates, upper eyelid w/Xythoma-lipid plaques. Stable. Bilateral TM's are retracted and without effusion or erythema.   NECK: supple. No masses, lymphadenopathy, JVD, or carotid bruits on exam. Trachea midline, no thyromegaly   CV:  S1-S2 No murmurs, rubs, or  gallops.   LUNGS: CTAB, No rhonchi, rales, wheezes.   GI: (+) BS in all 4 quadrants. soft, NT/ND.   No rigidity/ guarding/ rebound. No organomegaly/masses, or abdominal bruits  MSK: Thoracic spine: Tenderness to palpation of the lower thoracic and upper lumbar spine with positive Jump sign. No obvious abnormal curvature or step-off deformities. No radicular symptoms down the bilateral arms or around the abdomen.   Spontaneous normal AROM of major joints as observed during exam.   No joint effusions/ swelling/ deformities.    No BLE edema, clubbing, or cyanosis.   2+ radial pulse present b/l.   + 2 reflexes Brachioradialis, patellar bilaterally.   Normal gait  SKIN: No significant lesions, rashes, ecchymoses noted.   Note:midline chest scar consistent w/ CABG, well-healed and well-approximated.    No surrounding erythema or induration.   NEURO: AAOx4, CN grossly  intact. Sensation intact b/l. No focal deficits.  PSYCH: Mood, behavior and affect normal w/ Intact judgement & insight     CBC  Diff   Lab Results   Component Value Date/Time    WBC 7.7 04/04/2018 04:32 AM    WBC 14.0 (H) 04/16/2011 08:22 AM    HGB 12.6 04/04/2018 04:32 AM    HGB 10.3 (L) 04/16/2011 08:22 AM    HCT 36.5 (L) 04/04/2018 04:32 AM    HCT 32.3 (L) 04/16/2011 08:22 AM    PLTCNT 300 04/04/2018 04:32 AM    PLTCNT 1197 (H) 04/16/2011 08:22 AM    RBC 4.04 (L) 04/04/2018 04:32 AM    RBC 3.55 (L) 04/16/2011 08:22 AM    MCV 90.3 04/04/2018 04:32 AM    MCV 90.9 04/16/2011 08:22 AM    MCHC 34.7 04/04/2018 04:32 AM    MCHC 31.9 04/16/2011 08:22 AM    MCH 31.3 (H) 04/04/2018 04:32 AM    MCH 29.0 04/16/2011 08:22 AM    RDW 13.6 04/04/2018 04:32 AM    RDW 14.5 (H) 04/16/2011 08:22 AM    MPV 5.6 (L) 04/16/2011 08:22 AM    Lab Results   Component Value Date/Time    PMNS 46 (L) 03/29/2018 09:58 AM    PMNS 84 (H) 04/01/2011 05:08 AM    LYMPHOCYTES 41 (H) 03/29/2018 09:58 AM    LYMPHOCYTES 8 (L) 04/01/2011 05:08 AM    EOSINOPHIL 3 03/29/2018 09:58 AM    EOSINOPHIL 0 (L) 04/01/2011 05:08 AM    MONOCYTES 10 03/29/2018 09:58 AM    MONOCYTES 8 04/01/2011 05:08 AM    BASOPHILS 1 03/29/2018 09:58 AM    BASOPHILS 0.00 03/29/2018 09:58 AM    BASOPHILS 0 04/01/2011 05:08 AM    PMNABS 3.30 03/29/2018 09:58 AM    PMNABS 13.200 (H) 04/01/2011 05:08 AM    LYMPHSABS 3.00 03/29/2018 09:58 AM    LYMPHSABS 1.210 04/01/2011 05:08 AM    EOSABS 0.20 03/29/2018 09:58 AM    EOSABS 0.018 (L) 04/01/2011 05:08 AM    MONOSABS 0.70 03/29/2018 09:58 AM    MONOSABS 1.210 (H) 04/01/2011 05:08 AM    BASOSABS 0.034 04/01/2011 05:08 AM            Diabetes Monitors  A1C: 9.6  A1C Date: 03/29/2018          Urine Microalbumin: 0.54   Microalbumin Date: 09/15/2017    Last Lipid Panel  (Last result in the past 2 years)      Cholesterol   HDL   LDL   Direct LDL   Triglycerides      03/29/18 0958 134  Comment:  Total Cholesterol Classification:     <200         Desirable        200-239     Borderline High        > or = 240  High 52  Comment:  HDL Cholesterol Classification:          <40        Low        > or = 60  High   52  Comment:  LDL Cholesterol Classification:         <100     Optimal       100-129  Near or Above Optimal       130-159  Borderline High       160-189  High       >190     Very High   151  Comment:  Triglyceride Classification:            <150    Normal       150-199 Borderline-High       200-499 High       >500    Very High        Retinal Exam Date: Not Found  Last diabetic foot exam: Not Found      Visit Diagnosis    Encounter Diagnoses   Name Primary?   . Type 2 diabetes mellitus with complication, without long-term current use of insulin (CMS HCC)    . B12 deficiency    . Pain    . Constipation, unspecified constipation type    . Gastroesophageal reflux disease, esophagitis presence not specified    . Nausea    . Acute bronchitis, unspecified organism      Orders Placed This Encounter   . POCT HGB A1C   . BYDUREON 2 mg Subcutaneous   . cyanocobalamin (VITAMIN B12) 1,000 mcg/mL Injection Solution   . diclofenac sodium (VOLTAREN) 1 % Gel   . docusate sodium (COLACE) 100 mg Oral Capsule   . ergocalciferol, vitamin D2, (DRISDOL) 50,000 unit Oral Capsule   . Ibuprofen (MOTRIN) 800 mg Oral Tablet   . metFORMIN (GLUCOPHAGE) 500 mg Oral Tablet   . niacin (NIASPAN) 500 mg Oral Tablet Sustained Release   . omeprazole (PRILOSEC) 40 mg Oral Capsule, Delayed Release(E.C.)   . ondansetron (ZOFRAN ODT) 8 mg Oral Tablet, Rapid Dissolve   . VENTOLIN HFA 90 mcg/actuation Inhalation HFA Aerosol Inhaler       Type 2 diabetes mellitus with complication, without long-term current use of insulin (CMS HCC)  Chronic, worsened control. HbA1C improved from  10.1% to 9.3 despite recent weight loss.   Continue behavioral efforts including a diabetic friendly diet.  Continue Bydureon 2 mg SQ weekly.   She was previously taken off of oral medications due to gastric bypass surgery  and improved control.  Previously ordered [employer] monitor for her to use.   Continue  metformin.      Essential hypertension  Chronic, Controlled  Continue behavioral efforts, esp  Cardiac friendly diet,   < 2 gm salt q day.  Exercise 20 min q day most days of the week.  Continue BB - Toprol XL 25 mg daily.   Continue ACEI - lisinopril 2.5 mg daily.   Labs reviewed.    Hyperlipidemia, unspecified hyperlipidemia type  Lab Results   Component Value Date    CHOLESTEROL 134 03/29/2018    HDLCHOL 52 03/29/2018    LDLCHOL 52 (L) 03/29/2018    TRIG 151 03/29/2018   Repeat fasting labs.   Continue dietary and lifestyle modifications.   Continue Crestor 20 mg daily. .  Continue niacin 500 mg daily.     Eustachian tube dysfunction, bilateral  Improved.   Continue oral antihistamine - Claritin 10 mg daily.   continue nasal steroid - Flonase 2 sprays daily.     Vitamin D deficiency  Noted on previous labs.   Will recheck.   Not currently on supplementation.     Chronic bilateral thoracic back pain  Patient denies known injury.   Worsened recently.   Follows with pain management.   MRI  Previously ordered, not obtained.   Encouraged continued weight loss.     BMI 33.0-33.9, adult  BMI addressed: Advised on diet, weight loss, and exercise to reduce above normal BMI.   Continue dietary modifications and exercise.   S/P bariatric surgery with approximately 100 lbs weight loss.   Recent increase in weight.     Return in about 3 months (around 10/13/2018), or if symptoms worsen or fail to improve, for dm, weight loss.    Cherly Beach, MD  07/13/2018, 10:28    This note was partially generated using [employer] Fluency Direct system, and there may be some incorrect words, spellings, and punctuation that were not noted in checking the note before saving.

## 2018-07-15 ENCOUNTER — Ambulatory Visit
Admission: RE | Admit: 2018-07-15 | Discharge: 2018-07-15 | Disposition: A | Discharge status: Home / Self Care | Payer: Commercial Managed Care - PPO | Source: Ambulatory Visit | Attending: PHYSICIAN ASSISTANT | Admitting: PHYSICIAN ASSISTANT

## 2018-07-15 DIAGNOSIS — E041 Nontoxic single thyroid nodule: Secondary | ICD-10-CM | POA: Insufficient documentation

## 2018-07-15 MED ORDER — LIDOCAINE (PF) 10 MG/ML (1 %) INJECTION SOLUTION
INTRAMUSCULAR | Status: AC
Start: 2018-07-15 — End: 2018-07-15
  Filled 2018-07-15: qty 30

## 2018-07-20 ENCOUNTER — Other Ambulatory Visit (HOSPITAL_BASED_OUTPATIENT_CLINIC_OR_DEPARTMENT_OTHER): Payer: Self-pay | Admitting: Cardiovascular Disease

## 2018-07-21 LAB — HISTORICAL CYTOPATHOLOGY-FINE NEEDLE ASPIRATE

## 2018-07-27 ENCOUNTER — Telehealth (HOSPITAL_BASED_OUTPATIENT_CLINIC_OR_DEPARTMENT_OTHER): Payer: Self-pay | Admitting: Family Medicine

## 2018-07-27 DIAGNOSIS — E041 Nontoxic single thyroid nodule: Secondary | ICD-10-CM

## 2018-07-27 NOTE — Telephone Encounter (Signed)
Patient is wanting results of fine needle biopsy.

## 2018-07-27 NOTE — Telephone Encounter (Signed)
Please inform her that there were atypical cells noted in her biopsy.   I will be referring her to a general surgeon for possible removal of the nodule or partial thyroidectomy. They will determine what procedure she needs.  Is she ok with Dr. Mancel Bale, or would she like referral to [facility]?  I'm ok with either.     Chryl Heck, PA-C  07/27/2018, 16:01

## 2018-07-27 NOTE — Telephone Encounter (Signed)
patient wants to stay with Dr. Mancel Bale

## 2018-08-27 ENCOUNTER — Other Ambulatory Visit (INDEPENDENT_AMBULATORY_CARE_PROVIDER_SITE_OTHER): Payer: Self-pay | Admitting: Nurse Practitioner

## 2018-08-30 ENCOUNTER — Ambulatory Visit (HOSPITAL_BASED_OUTPATIENT_CLINIC_OR_DEPARTMENT_OTHER): Payer: Commercial Managed Care - PPO

## 2018-08-30 ENCOUNTER — Other Ambulatory Visit (HOSPITAL_BASED_OUTPATIENT_CLINIC_OR_DEPARTMENT_OTHER): Payer: Self-pay | Admitting: Surgery

## 2018-08-30 ENCOUNTER — Ambulatory Visit
Admission: RE | Admit: 2018-08-30 | Discharge: 2018-08-30 | Disposition: A | Discharge status: Home / Self Care | Payer: Commercial Managed Care - PPO | Source: Ambulatory Visit | Attending: Surgery | Admitting: Surgery

## 2018-08-30 DIAGNOSIS — Z01818 Encounter for other preprocedural examination: Secondary | ICD-10-CM | POA: Insufficient documentation

## 2018-08-30 DIAGNOSIS — Z01811 Encounter for preprocedural respiratory examination: Secondary | ICD-10-CM

## 2018-08-30 LAB — CBC WITH DIFF
BASOPHIL #: 0.1 x10ˆ3/uL (ref 0.00–1.00)
BASOPHIL %: 1 % (ref 0–1)
EOSINOPHIL #: 0.2 10*3/uL (ref 0.00–0.40)
EOSINOPHIL %: 3 % (ref 1–5)
HCT: 40.3 % (ref 37.0–47.0)
HGB: 13.6 g/dL (ref 12.0–16.0)
LYMPHOCYTE #: 2.9 x10ˆ3/uL (ref 1.00–4.00)
LYMPHOCYTE %: 33 % (ref 25–40)
MCH: 31.2 pg — ABNORMAL HIGH (ref 27.0–31.0)
MCHC: 33.6 g/dL (ref 33.0–37.0)
MCV: 92.7 fL (ref 80.0–99.0)
MONOCYTE #: 0.9 x10ˆ3/uL — ABNORMAL HIGH (ref 0.10–0.80)
MONOCYTE %: 11 % — ABNORMAL HIGH (ref 4–10)
NEUTROPHIL #: 4.6 x10ˆ3/uL (ref 1.80–7.00)
NEUTROPHIL %: 53 % (ref 50–73)
PLATELETS: 380 x10ˆ3/uL (ref 130–400)
RBC: 4.35 10*6/uL (ref 4.20–5.40)
RDW: 13.3 % (ref 11.5–14.5)
WBC: 8.7 10*3/uL (ref 4.8–10.8)

## 2018-08-30 LAB — COMPREHENSIVE METABOLIC PANEL, NON-FASTING
ALBUMIN: 3.8 g/dL (ref 3.6–4.8)
ALKALINE PHOSPHATASE: 127 U/L — ABNORMAL HIGH (ref 38–126)
ALT (SGPT): 28 U/L (ref 3–45)
ANION GAP: 6 mmol/L
AST (SGOT): 25 U/L (ref 7–56)
BILIRUBIN TOTAL: 0.4 mg/dL (ref 0.2–1.3)
BILIRUBIN TOTAL: 0.4 mg/dL (ref 0.2–1.3)
BUN/CREA RATIO: 19
BUN: 15 mg/dL (ref 7–18)
CALCIUM: 9.7 mg/dL (ref 8.5–10.3)
CALCIUM: 9.7 mg/dL (ref 8.5–10.3)
CHLORIDE: 104 mmol/L (ref 101–111)
CO2 TOTAL: 29 mmol/L (ref 22–31)
CREATININE: 0.8 mg/dL (ref 0.50–1.20)
ESTIMATED GFR: 75 mL/min/1.73mˆ2 (ref >=60)
GLUCOSE: 280 mg/dL — ABNORMAL HIGH (ref 68–99)
POTASSIUM: 4.6 mmol/L (ref 3.6–5.0)
PROTEIN TOTAL: 6.5 g/dL (ref 6.2–8.0)
SODIUM: 139 mmol/L (ref 137–145)
SODIUM: 139 mmol/L (ref 137–145)

## 2018-09-03 ENCOUNTER — Ambulatory Visit
Admission: RE | Admit: 2018-09-03 | Discharge: 2018-09-03 | Disposition: A | Discharge status: Home / Self Care | Payer: Commercial Managed Care - PPO | Source: Ambulatory Visit | Attending: Surgery | Admitting: Surgery

## 2018-09-03 ENCOUNTER — Encounter (HOSPITAL_COMMUNITY): Payer: Self-pay

## 2018-09-03 NOTE — Nurses Notes (Signed)
Message left with Vickie in anesthesia that pt A1C is 9.3 and pt requests Dr Laurelyn Sickle to do anesthesia.  Dr Robert's Dot also notified of  A1C 9.3.

## 2018-09-04 LAB — ECG 12 LEAD
Atrial Rate: 73 {beats}/min
Calculated R Axis: 69 degrees
Calculated T Axis: 31 degrees
Calculated T Axis: 31 degrees
PR Interval: 164 ms
QRS Duration: 86 ms
QT Interval: 384 ms
QTC Calculation: 423 ms
Ventricular rate: 73 {beats}/min

## 2018-09-07 ENCOUNTER — Ambulatory Visit (HOSPITAL_COMMUNITY): Payer: Commercial Managed Care - PPO | Admitting: Anesthesiology

## 2018-09-07 ENCOUNTER — Ambulatory Visit (HOSPITAL_COMMUNITY): Payer: Commercial Managed Care - PPO | Admitting: Surgery

## 2018-09-07 ENCOUNTER — Inpatient Hospital Stay
Admission: RE | Admit: 2018-09-07 | Discharge: 2018-09-08 | Disposition: A | Discharge status: Home / Self Care | Payer: Commercial Managed Care - PPO | Source: Ambulatory Visit | Attending: Surgery | Admitting: Surgery

## 2018-09-07 ENCOUNTER — Other Ambulatory Visit (HOSPITAL_COMMUNITY): Payer: Self-pay | Admitting: Surgery

## 2018-09-07 ENCOUNTER — Encounter (HOSPITAL_COMMUNITY)
Admission: RE | Disposition: A | Discharge status: Home / Self Care | Payer: Self-pay | Source: Ambulatory Visit | Attending: Surgery

## 2018-09-07 ENCOUNTER — Encounter (HOSPITAL_COMMUNITY): Payer: Self-pay | Admitting: Anesthesiology

## 2018-09-07 DIAGNOSIS — K219 Gastro-esophageal reflux disease without esophagitis: Secondary | ICD-10-CM | POA: Insufficient documentation

## 2018-09-07 DIAGNOSIS — I2510 Atherosclerotic heart disease of native coronary artery without angina pectoris: Secondary | ICD-10-CM | POA: Insufficient documentation

## 2018-09-07 DIAGNOSIS — E041 Nontoxic single thyroid nodule: Secondary | ICD-10-CM | POA: Diagnosis present

## 2018-09-07 DIAGNOSIS — D34 Benign neoplasm of thyroid gland: Secondary | ICD-10-CM | POA: Insufficient documentation

## 2018-09-07 DIAGNOSIS — E669 Obesity, unspecified: Secondary | ICD-10-CM | POA: Insufficient documentation

## 2018-09-07 DIAGNOSIS — Z7902 Long term (current) use of antithrombotics/antiplatelets: Secondary | ICD-10-CM | POA: Insufficient documentation

## 2018-09-07 DIAGNOSIS — M5136 Other intervertebral disc degeneration, lumbar region: Secondary | ICD-10-CM | POA: Insufficient documentation

## 2018-09-07 DIAGNOSIS — E7800 Pure hypercholesterolemia, unspecified: Secondary | ICD-10-CM | POA: Insufficient documentation

## 2018-09-07 DIAGNOSIS — E119 Type 2 diabetes mellitus without complications: Secondary | ICD-10-CM | POA: Insufficient documentation

## 2018-09-07 DIAGNOSIS — Z8673 Personal history of transient ischemic attack (TIA), and cerebral infarction without residual deficits: Secondary | ICD-10-CM | POA: Insufficient documentation

## 2018-09-07 DIAGNOSIS — Z951 Presence of aortocoronary bypass graft: Secondary | ICD-10-CM | POA: Insufficient documentation

## 2018-09-07 DIAGNOSIS — F17210 Nicotine dependence, cigarettes, uncomplicated: Secondary | ICD-10-CM | POA: Insufficient documentation

## 2018-09-07 DIAGNOSIS — K5900 Constipation, unspecified: Secondary | ICD-10-CM | POA: Insufficient documentation

## 2018-09-07 DIAGNOSIS — F419 Anxiety disorder, unspecified: Secondary | ICD-10-CM | POA: Insufficient documentation

## 2018-09-07 DIAGNOSIS — Z6835 Body mass index (BMI) 35.0-35.9, adult: Secondary | ICD-10-CM | POA: Insufficient documentation

## 2018-09-07 DIAGNOSIS — Z7984 Long term (current) use of oral hypoglycemic drugs: Secondary | ICD-10-CM | POA: Insufficient documentation

## 2018-09-07 DIAGNOSIS — I252 Old myocardial infarction: Secondary | ICD-10-CM | POA: Insufficient documentation

## 2018-09-07 HISTORY — DX: Type 2 diabetes mellitus without complications: E11.9

## 2018-09-07 LAB — IONIZED CALCIUM, SERUM
CALCIUM: 9.5 mg/dL (ref 8.5–10.3)
IONIZED CALCIUM, CALCLULATED: 4.59 (ref 3.50–5.20)
PROTEIN TOTAL: 6 g/dL — ABNORMAL LOW (ref 6.2–8.0)

## 2018-09-07 LAB — POC BLOOD GLUCOSE (RESULTS): GLUCOSE, POC: 215 mg/dl (ref 80–130)

## 2018-09-07 SURGERY — THYROIDECTOMY
Anesthesia: General | Wound class: Clean Wound: Uninfected operative wounds in which no inflammation occurred

## 2018-09-07 MED ORDER — LACTATED RINGERS INTRAVENOUS SOLUTION
INTRAVENOUS | Status: DC | PRN
Start: 2018-09-07 — End: 2018-09-07

## 2018-09-07 MED ORDER — FENTANYL (PF) 50 MCG/ML INJECTION SOLUTION
Freq: Once | INTRAMUSCULAR | Status: DC | PRN
Start: 2018-09-07 — End: 2018-09-07
  Administered 2018-09-07 (×4): 50 ug via INTRAVENOUS

## 2018-09-07 MED ORDER — HYDROMORPHONE 1 MG/ML INJECTION WRAPPER
0.80 mg | INJECTION | INTRAMUSCULAR | Status: DC | PRN
Start: 2018-09-07 — End: 2018-09-08

## 2018-09-07 MED ORDER — BUPIVACAINE-EPINEPHRINE (PF) 0.5 %-1:200,000 INJECTION SOLUTION
Freq: Once | INTRAMUSCULAR | Status: DC | PRN
Start: 2018-09-07 — End: 2018-09-07
  Administered 2018-09-07: 30 mL via INTRAMUSCULAR

## 2018-09-07 MED ORDER — ONDANSETRON HCL (PF) 4 MG/2 ML INJECTION SOLUTION
4.0000 mg | Freq: Four times a day (QID) | INTRAMUSCULAR | Status: DC | PRN
Start: 2018-09-07 — End: 2018-09-08
  Administered 2018-09-08: 4 mg via INTRAVENOUS
  Filled 2018-09-07: qty 2

## 2018-09-07 MED ORDER — PROPOFOL 10 MG/ML IV BOLUS
INJECTION | Freq: Once | INTRAVENOUS | Status: DC | PRN
Start: 2018-09-07 — End: 2018-09-07
  Administered 2018-09-07: 200 mg via INTRAVENOUS

## 2018-09-07 MED ORDER — LIDOCAINE HCL 20 MG/ML (2 %) INJECTION SOLUTION
Freq: Once | INTRAMUSCULAR | Status: DC | PRN
Start: 2018-09-07 — End: 2018-09-07
  Administered 2018-09-07: 60 mg

## 2018-09-07 MED ORDER — SODIUM CHLORIDE 0.9 % (FLUSH) INJECTION SYRINGE
3.0000 mL | INJECTION | Freq: Three times a day (TID) | INTRAMUSCULAR | Status: DC
Start: 2018-09-07 — End: 2018-09-08
  Administered 2018-09-07 – 2018-09-08 (×4): 0 mL

## 2018-09-07 MED ORDER — LACTATED RINGERS INTRAVENOUS SOLUTION
INTRAVENOUS | Status: DC
Start: 2018-09-07 — End: 2018-09-08
  Administered 2018-09-07: 0 via INTRAVENOUS

## 2018-09-07 MED ORDER — LIDOCAINE HCL 20 MG/ML (2 %) INJECTION SOLUTION
INTRAMUSCULAR | Status: AC
Start: 2018-09-07 — End: 2018-09-07
  Filled 2018-09-07: qty 20

## 2018-09-07 MED ORDER — RANITIDINE 150 MG TABLET
150.00 mg | ORAL_TABLET | Freq: Two times a day (BID) | ORAL | Status: DC
Start: 2018-09-07 — End: 2018-09-07

## 2018-09-07 MED ORDER — ACETAMINOPHEN 1,000 MG/100 ML (10 MG/ML) INTRAVENOUS SOLUTION
INTRAVENOUS | Status: AC
Start: 2018-09-07 — End: 2018-09-07
  Filled 2018-09-07: qty 100

## 2018-09-07 MED ORDER — HYDROMORPHONE 0.5 MG/0.5 ML INJECTION SYRINGE
0.5000 mg | INJECTION | INTRAMUSCULAR | Status: DC | PRN
Start: 2018-09-07 — End: 2018-09-07

## 2018-09-07 MED ORDER — IPRATROPIUM 0.5 MG-ALBUTEROL 3 MG (2.5 MG BASE)/3 ML NEBULIZATION SOLN
3.0000 mL | INHALATION_SOLUTION | Freq: Once | RESPIRATORY_TRACT | Status: DC | PRN
Start: 2018-09-07 — End: 2018-09-07

## 2018-09-07 MED ORDER — NIACIN ER 500 MG TABLET,EXTENDED RELEASE 24 HR
500.0000 mg | ORAL_TABLET | Freq: Every day | ORAL | Status: DC
Start: 2018-09-07 — End: 2018-09-07
  Filled 2018-09-07: qty 1

## 2018-09-07 MED ORDER — MEPERIDINE (PF) 25 MG/ML INJECTION SOLUTION
25.0000 mg | INTRAMUSCULAR | Status: DC | PRN
Start: 2018-09-07 — End: 2018-09-07

## 2018-09-07 MED ORDER — FUROSEMIDE 40 MG TABLET
40.0000 mg | ORAL_TABLET | Freq: Every day | ORAL | Status: DC
Start: 2018-09-07 — End: 2018-09-08
  Administered 2018-09-07: 0 mg via ORAL
  Administered 2018-09-08: 40 mg via ORAL
  Filled 2018-09-07 (×2): qty 1

## 2018-09-07 MED ORDER — SODIUM CHLORIDE 0.9 % (FLUSH) INJECTION SYRINGE
3.0000 mL | INJECTION | INTRAMUSCULAR | Status: DC | PRN
Start: 2018-09-07 — End: 2018-09-08

## 2018-09-07 MED ORDER — NIACIN ER 500 MG TABLET,EXTENDED RELEASE
500.0000 mg | ORAL_TABLET | Freq: Every day | ORAL | Status: DC
Start: 2018-09-07 — End: 2018-09-07

## 2018-09-07 MED ORDER — ROSUVASTATIN 20 MG TABLET
20.0000 mg | ORAL_TABLET | Freq: Every day | ORAL | Status: DC
Start: 2018-09-07 — End: 2018-09-08
  Administered 2018-09-07 – 2018-09-08 (×3): 20 mg via ORAL
  Filled 2018-09-07 (×2): qty 1

## 2018-09-07 MED ORDER — KETOROLAC 30 MG/ML (1 ML) INJECTION SOLUTION
Freq: Once | INTRAMUSCULAR | Status: DC | PRN
Start: 2018-09-07 — End: 2018-09-07
  Administered 2018-09-07: 30 mg via INTRAVENOUS

## 2018-09-07 MED ORDER — MIDAZOLAM 1 MG/ML INJECTION SOLUTION
Freq: Once | INTRAMUSCULAR | Status: DC | PRN
Start: 2018-09-07 — End: 2018-09-07
  Administered 2018-09-07 (×2): 2 mg via INTRAVENOUS

## 2018-09-07 MED ORDER — KETOROLAC 30 MG/ML (1 ML) INJECTION SOLUTION
INTRAMUSCULAR | Status: AC
Start: 2018-09-07 — End: 2018-09-07
  Filled 2018-09-07: qty 1

## 2018-09-07 MED ORDER — MORPHINE 4 MG/ML INTRAVENOUS SOLUTION
4.0000 mg | INTRAVENOUS | Status: DC | PRN
Start: 2018-09-07 — End: 2018-09-07
  Administered 2018-09-07: 4 mg via INTRAVENOUS

## 2018-09-07 MED ORDER — ONDANSETRON HCL (PF) 4 MG/2 ML INJECTION SOLUTION
INTRAMUSCULAR | Status: AC
Start: 2018-09-07 — End: 2018-09-07
  Filled 2018-09-07: qty 2

## 2018-09-07 MED ORDER — LISINOPRIL 5 MG TABLET
2.5000 mg | ORAL_TABLET | Freq: Every day | ORAL | Status: DC
Start: 2018-09-07 — End: 2018-09-08
  Administered 2018-09-07: 0 mg via ORAL
  Administered 2018-09-08: 2.5 mg via ORAL
  Filled 2018-09-07: qty 1

## 2018-09-07 MED ORDER — ONDANSETRON HCL (PF) 4 MG/2 ML INJECTION SOLUTION
Freq: Once | INTRAMUSCULAR | Status: DC | PRN
Start: 2018-09-07 — End: 2018-09-07
  Administered 2018-09-07: 4 mg via INTRAVENOUS

## 2018-09-07 MED ORDER — BACITRACIN 50,000 UNIT INTRAMUSCULAR SOLUTION
INTRAMUSCULAR | Status: AC
Start: 2018-09-07 — End: 2018-09-07
  Filled 2018-09-07: qty 10

## 2018-09-07 MED ORDER — METFORMIN 500 MG TABLET
500.00 mg | ORAL_TABLET | Freq: Two times a day (BID) | ORAL | Status: DC
Start: 2018-09-07 — End: 2018-09-08
  Administered 2018-09-07 – 2018-09-08 (×2): 500 mg via ORAL
  Filled 2018-09-07 (×2): qty 1

## 2018-09-07 MED ORDER — ROCURONIUM 10 MG/ML INTRAVENOUS SOLUTION
Freq: Once | INTRAVENOUS | Status: DC | PRN
Start: 2018-09-07 — End: 2018-09-07
  Administered 2018-09-07: 50 mg via INTRAVENOUS

## 2018-09-07 MED ORDER — CEFAZOLIN 2 GRAM/100 ML IN DEXTROSE(ISO-OSMOTIC) INTRAVENOUS PIGGYBACK
2.0000 g | INJECTION | Freq: Once | INTRAVENOUS | Status: AC
Start: 2018-09-07 — End: 2018-09-07
  Administered 2018-09-07: 2 g via INTRAVENOUS
  Filled 2018-09-07: qty 100

## 2018-09-07 MED ORDER — FENTANYL (PF) 50 MCG/ML INJECTION SOLUTION
INTRAMUSCULAR | Status: AC
Start: 2018-09-07 — End: 2018-09-07
  Filled 2018-09-07: qty 2

## 2018-09-07 MED ORDER — SODIUM CHLORIDE 0.9 % IRRIGATION SOLUTION
Freq: Once | Status: DC | PRN
Start: 2018-09-07 — End: 2018-09-07
  Administered 2018-09-07 (×2): 120 mL

## 2018-09-07 MED ORDER — METOPROLOL SUCCINATE ER 25 MG TABLET,EXTENDED RELEASE 24 HR
25.0000 mg | ORAL_TABLET | Freq: Every day | ORAL | Status: DC
Start: 2018-09-08 — End: 2018-09-08
  Administered 2018-09-08: 25 mg via ORAL
  Filled 2018-09-07: qty 1

## 2018-09-07 MED ORDER — INSULIN REGULAR HUMAN 100 UNIT/ML INJECTION CORRECTIONAL - CCMC
1.0000 [IU] | Freq: Once | INTRAMUSCULAR | Status: DC | PRN
Start: 2018-09-07 — End: 2018-09-07
  Administered 2018-09-07: 4 [IU] via SUBCUTANEOUS

## 2018-09-07 MED ORDER — PROPOFOL 10 MG/ML INTRAVENOUS EMULSION
INTRAVENOUS | Status: AC
Start: 2018-09-07 — End: 2018-09-07
  Filled 2018-09-07: qty 20

## 2018-09-07 MED ORDER — ROCURONIUM 10 MG/ML INTRAVENOUS SOLUTION
INTRAVENOUS | Status: AC
Start: 2018-09-07 — End: 2018-09-07
  Filled 2018-09-07: qty 5

## 2018-09-07 MED ORDER — ACETAMINOPHEN 1,000 MG/100 ML (10 MG/ML) INTRAVENOUS SOLUTION
Freq: Once | INTRAVENOUS | Status: DC | PRN
Start: 2018-09-07 — End: 2018-09-07
  Administered 2018-09-07: 1000 mg via INTRAVENOUS

## 2018-09-07 MED ORDER — LACTATED RINGERS INTRAVENOUS SOLUTION
1000.0000 mL | INTRAVENOUS | Status: DC
Start: 2018-09-07 — End: 2018-09-07
  Administered 2018-09-07: 1000 mL via INTRAVENOUS

## 2018-09-07 MED ORDER — RACEPINEPHRINE 2.25 % SOLUTION FOR NEBULIZATION
0.5000 mL | INHALATION_SOLUTION | Freq: Once | RESPIRATORY_TRACT | Status: DC | PRN
Start: 2018-09-07 — End: 2018-09-07

## 2018-09-07 MED ORDER — MORPHINE 4 MG/ML INTRAVENOUS SOLUTION
INTRAVENOUS | Status: AC
Start: 2018-09-07 — End: 2018-09-07
  Filled 2018-09-07: qty 1

## 2018-09-07 MED ORDER — ONDANSETRON HCL (PF) 4 MG/2 ML INJECTION SOLUTION
4.0000 mg | Freq: Once | INTRAMUSCULAR | Status: DC | PRN
Start: 2018-09-07 — End: 2018-09-07

## 2018-09-07 MED ORDER — LABETALOL 20 MG/4 ML (5 MG/ML) INTRAVENOUS SYRINGE
INJECTION | Freq: Once | INTRAVENOUS | Status: DC | PRN
Start: 2018-09-07 — End: 2018-09-07
  Administered 2018-09-07 (×2): 10 mg via INTRAVENOUS

## 2018-09-07 MED ORDER — FAMOTIDINE 20 MG TABLET
20.00 mg | ORAL_TABLET | Freq: Two times a day (BID) | ORAL | Status: DC
Start: 2018-09-07 — End: 2018-09-08
  Administered 2018-09-07 – 2018-09-08 (×3): 20 mg via ORAL
  Filled 2018-09-07 (×3): qty 1

## 2018-09-07 MED ORDER — NIACIN ER 500 MG TABLET,EXTENDED RELEASE 24 HR
500.00 mg | ORAL_TABLET | Freq: Every evening | ORAL | Status: DC
Start: 2018-09-07 — End: 2018-09-08
  Administered 2018-09-07: 500 mg via ORAL
  Filled 2018-09-07: qty 1

## 2018-09-07 MED ORDER — BUPIVACAINE-EPINEPHRINE (PF) 0.5 %-1:200,000 INJECTION SOLUTION
INTRAMUSCULAR | Status: AC
Start: 2018-09-07 — End: 2018-09-07
  Filled 2018-09-07: qty 60

## 2018-09-07 MED ORDER — ALBUTEROL SULFATE CONCENTRATE 2.5 MG/0.5 ML SOLUTION FOR NEBULIZATION
2.50 mg | INHALATION_SOLUTION | Freq: Four times a day (QID) | RESPIRATORY_TRACT | Status: DC | PRN
Start: 2018-09-07 — End: 2018-09-08

## 2018-09-07 MED ORDER — MIDAZOLAM 1 MG/ML INJECTION SOLUTION
INTRAMUSCULAR | Status: AC
Start: 2018-09-07 — End: 2018-09-07
  Filled 2018-09-07: qty 2

## 2018-09-07 MED ORDER — OXYCODONE-ACETAMINOPHEN 5 MG-325 MG TABLET
1.00 | ORAL_TABLET | ORAL | Status: DC | PRN
Start: 2018-09-07 — End: 2018-09-08
  Administered 2018-09-07 (×2): 1 via ORAL
  Filled 2018-09-07 (×4): qty 1

## 2018-09-07 MED ORDER — METOCLOPRAMIDE 5 MG/ML INJECTION SOLUTION
10.0000 mg | Freq: Four times a day (QID) | INTRAMUSCULAR | Status: DC | PRN
Start: 2018-09-07 — End: 2018-09-08

## 2018-09-07 MED ORDER — METOCLOPRAMIDE 5 MG/ML INJECTION SOLUTION
10.0000 mg | Freq: Once | INTRAMUSCULAR | Status: DC | PRN
Start: 2018-09-07 — End: 2018-09-07
  Administered 2018-09-07: 10 mg via INTRAVENOUS

## 2018-09-07 MED ORDER — LACTATED RINGERS INTRAVENOUS SOLUTION
INTRAVENOUS | Status: DC
Start: 2018-09-07 — End: 2018-09-08

## 2018-09-07 MED ORDER — NALOXONE 1 MG/ML INJECTION SYRINGE
1.0000 mg | INJECTION | INTRAMUSCULAR | Status: DC | PRN
Start: 2018-09-07 — End: 2018-09-08

## 2018-09-07 MED ORDER — PANTOPRAZOLE 40 MG TABLET,DELAYED RELEASE
40.00 mg | DELAYED_RELEASE_TABLET | Freq: Every day | ORAL | Status: DC
Start: 2018-09-07 — End: 2018-09-08
  Administered 2018-09-07 – 2018-09-08 (×2): 40 mg via ORAL
  Filled 2018-09-07 (×2): qty 1

## 2018-09-07 MED ORDER — METOCLOPRAMIDE 5 MG/ML INJECTION SOLUTION
INTRAMUSCULAR | Status: AC
Start: 2018-09-07 — End: 2018-09-07
  Filled 2018-09-07: qty 2

## 2018-09-07 MED ORDER — SUGAMMADEX 100 MG/ML INTRAVENOUS SOLUTION
Freq: Once | INTRAVENOUS | Status: DC | PRN
Start: 2018-09-07 — End: 2018-09-07
  Administered 2018-09-07 (×2): 200 mg via INTRAVENOUS

## 2018-09-07 MED ORDER — OXYCODONE-ACETAMINOPHEN 5 MG-325 MG TABLET
2.00 | ORAL_TABLET | ORAL | Status: DC | PRN
Start: 2018-09-07 — End: 2018-09-08
  Administered 2018-09-07 – 2018-09-08 (×3): 2 via ORAL
  Filled 2018-09-07 (×2): qty 2

## 2018-09-07 MED ADMIN — NICU SODIUM ACETATE LINE FLUSH: ORAL | @ 13:00:00 | NDC 00517209625

## 2018-09-07 MED ADMIN — heparin, porcine (PF) 10 unit/mL intravenous syringe: INTRAVENOUS | @ 09:00:00

## 2018-09-07 SURGICAL SUPPLY — 80 items
ADHESIVE TISSUE EXOFIN 1.0ML_PREMIERPRO EXOFIN (SEALANTS) ×1
AIRWAY 80MM GDL PHRNG NONST LF  DISP GRN (AIR) ×1 IMPLANT
AIRWAY GUEDEL 80MM GREEN_3580EU 50EA/CS (AIR) ×1
APPL 70% ISPRP 2% CHG 26ML 13._2X13.2IN CHLRPRP PREP DEHP-FR (WOUND CARE/ENTEROSTOMAL SUPPLY) ×1
APPL 70% ISPRP 2% CHG 26ML CHLRPRP HI-LT ORNG PREP STRL LF  DISP CLR (WOUND CARE SUPPLY) ×1 IMPLANT
CAN SUCT 1200CC 90 DEG ADPR LOCK LID OVFLW SHTOF VALVE MEDIVAC GRDN DISP (Suction) ×1 IMPLANT
CAN SUCT 1200CC 90 DEG ADPR LO_CK LID OVFLW SHTOF VALVE (Suction) ×1
CANNULA NASAL 7FT ANGL FLXB LIP PLATE CRSH RS LUM TUBE FLR TIP ADULT ARLF UCIT STD CURVE LF  DISP (CANNULA) ×1 IMPLANT
CANNULA NASAL 7FT ANGL FLXB LI_P PLATE CRSH RS LUM TUBE FLR (CANNULA) ×1
CIRCUIT BREATHING 2 LIMB 2 FILTER BRTH MASK GAS SAMPLE LINE 33-87IN ADULT 3L LF  PORTEX DISP STR WYE (AIR) ×1 IMPLANT
CIRCUIT BREATHING 2 LIMB 2 FLT_R BRTH MASK GAS SAMPLE LINE 33 (AIR) ×1
CLIP LIGACLIP TI MED LT200_BX/36 (INSTRUMENTS ENDOMECHANICAL)
CLIP LIGACLIP TI SM LT100_BX/36 (INSTRUMENTS ENDOMECHANICAL)
CLIP MED 3.2MM INTERNAL LIGACLIP X TI LGT OPN STRL DISP ENDOS LF  SILVER (ENDOSCOPIC SUPPLIES) IMPLANT
CLIP SM 2.6MM INTERNAL LIGACLIP X TI LGT OPN STRL DISP ENDOS LF  BLU (ENDOSCOPIC SUPPLIES) IMPLANT
CONV USE 135507 - NEEDLE HYPO  18GA 1.5IN MAGELLAN SS BVL ORT SLF LEVEL SHEATH SFSHLD STD LL SYRG PNK STRL LF  DISP (NEEDLES & SYRINGE SUPPLIES) ×1 IMPLANT
CONV USE 330322 - NEEDLE HYPO  22GA 1.5IN MONOJECT MAGELLAN SS BVL ORT SLF LEVEL SHEATH SFSHLD STD LL SYRG BLK STRL LF (NEEDLES & SYRINGE SUPPLIES) ×2 IMPLANT
CONV USE 337901 - PACK SURG LAP GN STRL DISP LF (CUSTOM TRAYS & PACK) ×1
CONV USE ITEM 156524 - ADHESIVE TISSUE EXOFIN 1.0ML_PREMIERPRO EXOFIN (SEALANTS) ×1 IMPLANT
CONV USE ITEM 306391 - GLOVE SURG 7 LTX 3 PLY PF BEAD_CUF SMTH STRL BRN TINT (GLOVES AND ACCESSORIES) ×4 IMPLANT
CONV USE ITEM 306558- GLOVE SURG 6.5 LF SMTH BEAD_CUF STRL BLU 12IN PROTEXIS (GLOVES AND ACCESSORIES) ×1
CONV USE ITEM 306572- GLOVE SURG 7.5 LF SMTH BEAD_CUF STRL BLU 12IN PROTEXIS (GLOVES AND ACCESSORIES) ×1
CONV USE ITEM 308885 - GLOVE SURG 6.5 LF PF BEAD CUF SMTH STRL BRN TINT 12IN (GLOVES AND ACCESSORIES) ×1
CONV USE ITEM 321832 - GLOVE SURG 6.5 LF PF BEAD CUF SMTH STRL BRN TINT 12IN (GLOVES AND ACCESSORIES) ×1 IMPLANT
CONV USE ITEM 321855 - GLOVE SURG 6.5 LF SMTH BEAD_CUF STRL BLU 12IN PROTEXIS (GLOVES AND ACCESSORIES) ×1 IMPLANT
CONV USE ITEM 321862 - GLOVE SURG 7.5 LF SMTH BEAD_CUF STRL BLU 12IN PROTEXIS (GLOVES AND ACCESSORIES) ×1 IMPLANT
DISSECTOR LAPAROSCOPIC 13CM_SCD13 6EA/CS (INSTRUMENTS) ×1
DISSECTOR LAPAROSCOPIC 13CM_SCD13 6EA/CS (SURGICAL INSTRUMENTS) ×1 IMPLANT
DRAIN TLS 10FR EVACUATE TUBE R ADOPQ CATH RESERVOIR SIL (Drains/Resovoirs) ×2 IMPLANT
DRAPE DIAMOND FENESTRATE HKLP CLSR 121X102X77IN THR T PRXM LF  STRL DISP SURG SMS (PROTECTIVE PRODUCTS/GARMENTS) ×1 IMPLANT
DRAPE DIAMOND FENESTRATE HKLP_CLSR 121X102X77IN THR T PRXM (PROTECTIVE PRODUCTS/GARMENTS) ×1
ELECTRODE PATIENT RTN 9FT VLAB C30- LB RM PHSV ACRL FOAM CORD NONIRRITATE NONSENSITIZE ADH STRP (CAUTERY SUPPLIES) ×1 IMPLANT
ELECTRODE PATIENT RTN 9FT VLAB_REM C30- LB PLHSV ACRL FOAM (CAUTERY SUPPLIES) ×1
GARMENT COMPRESS MED THG CENTA_URA NYL VASOGRAD LTWT BRTHBL (ORTHOPEDICS (NOT IMPLANTS)) IMPLANT
GLOVE SURG 7 LTX 3 PLY PF BEAD_CUF SMTH STRL BRN TINT (GLOVES AND ACCESSORIES) ×4
HANDPC SUCT MEDIVAC YANKAUER BLBS TIP CLR STRL LF  DISP (Suction) ×1 IMPLANT
HANDPC SUCT MEDIVAC YANKAUER B_LBS TIP CLR STRL LF DISP (Suction) ×1
HEMOSTAT ABS 14X2IN FLXB SHR W_V SRGCL STRL DISP (WOUND CARE SUPPLY) ×1 IMPLANT
HEMOSTAT ABS 14X2IN FLXB SHR W_V SRGCL STRL DISP (WOUND CARE/ENTEROSTOMAL SUPPLY) ×1
NEEDLE HYPO 18GA 1.5IN MONOJE_CT MGLN SS SFSHLD BVL ORT SLF (NEEDLES & SYRINGE SUPPLIES) ×1
NEEDLE HYPO 22GA 1.5IN MONOJE_CT MGLN SS SFSHLD BVL ORT SLF (NEEDLES & SYRINGE SUPPLIES) ×2
PACK LAP GENERAL CUSTOM_DYNJ36227C 2EA/CS (CUSTOM TRAYS & PACK) ×1
PACK SURG LAP GN STRL DISP LF (CUSTOM TRAYS & PACK) ×1 IMPLANT
PAD INST MAGNETIC MED ST_CS/48 20016B (INSTRUMENTS) ×1
PAD MED MAG INSTR STRL (SURGICAL INSTRUMENTS) ×1 IMPLANT
PAD RELEASE 3 X 4 IN_1050 50/BX (WOUND CARE SUPPLY) ×1 IMPLANT
PAD RELEASE 3 X 4 IN_1050 50/BX (WOUND CARE/ENTEROSTOMAL SUPPLY) ×1
SOL IRRG 0.9% NACL 1000ML PLASTIC PR BTL ISTNC N-PYRG STRL LF (SOLUTIONS) IMPLANT
SOLUTION IRRG NS 2F7124 1000CC_12/CS (SOLUTIONS)
SPONGE DISSECTOR STERILE 5S_30115 50EA/PK/CSG/5 EA (WOUND CARE/ENTEROSTOMAL SUPPLY) ×3
SPONGE GAUZE 3X3 STERILE 2S 1903 25EA/48BX/CS (WOUND CARE SUPPLY) ×1 IMPLANT
SPONGE GAUZE 3X3 STERILE 2S 1903 25EA/48BX/CS (WOUND CARE/ENTEROSTOMAL SUPPLY) ×1
SPONGE SURG DSCT STRL LF (WOUND CARE SUPPLY) ×3 IMPLANT
SUTURE 3-0 CT1 VICRYL 36IN UNDYED BRD COAT ABS (SUTURE/WOUND CLOSURE) ×1 IMPLANT
SUTURE 3-0 CT1 VICRYL 36IN UND_YED BRD COAT ABS (SUTURE/WOUND CLOSURE) ×1
SUTURE 3-0 SH-1 VICRYL 27IN UNDYED BRD COAT ABS (SUTURE/WOUND CLOSURE) ×1 IMPLANT
SUTURE 3-0 SH-1 VICRYL 27IN UN_DYED BRD COAT ABS (SUTURE/WOUND CLOSURE) ×1
SUTURE 4-0 PS2 MONOCRYL MTPS 27IN UNDYED MONOF ABS (SUTURE/WOUND CLOSURE) ×1 IMPLANT
SUTURE 4-0 PS2 MONOCRYL MTPS 2_7IN UNDYED MONOF ABS (SUTURE/WOUND CLOSURE) ×1
SUTURE 4-0 PS2 VICRYL MTPS 18I_N UNDYED BRDCOAT ABS (SUTURE/WOUND CLOSURE) IMPLANT
SUTURE CHR 4-0 RB1 27IN BRN MONOF ABS (SUTURE/WOUND CLOSURE) IMPLANT
SUTURE CHR GUT 4-0 RB1 27IN BR_N MONOF ABS (SUTURE/WOUND CLOSURE)
SUTURE SILK 2-0 FS PERMAHAND 18IN BLK BRD NONAB (SUTURE/WOUND CLOSURE) ×1 IMPLANT
SUTURE SILK 2-0 FS PERMAHAND 1_8IN BLK BRD NONAB (SUTURE/WOUND CLOSURE) ×1
SUTURE SILK 2-0 PERMAHAND 30IN BLK BRD TIE 12 STRN PCUT NONAB (SUTURE/WOUND CLOSURE) ×1 IMPLANT
SUTURE SILK 2-0 PERMAHAND 30IN_BLK BRD TIE 12 STRN PCUT (SUTURE/WOUND CLOSURE) ×1
SUTURE SILK 2-0 SH PERMAHAND 30IN BLK BRD NONAB (SUTURE/WOUND CLOSURE) IMPLANT
SUTURE SILK 2-0 SH PERMAHAND 3_0IN BLK BRD NONAB (SUTURE/WOUND CLOSURE)
SUTURE SILK 3-0 SH PERMAHAND 30IN BLK BRD NONAB (SUTURE/WOUND CLOSURE) ×1 IMPLANT
SUTURE SILK 3-0 SH PERMAHAND 3_0IN BLK BRD NONAB (SUTURE/WOUND CLOSURE) ×1
SYRINGE LL 10ML LF  STRL GRAD N-PYRG DEHP-FR PVC FREE MED DISP (NEEDLES & SYRINGE SUPPLIES) ×2 IMPLANT
SYRINGE LL 10ML LF STRL MED D_ISP (NEEDLES & SYRINGE SUPPLIES) ×2
TUBE DRAIN QUINTUBE MONITOR PK_EVACUATE COLOR OVR TIME TLS (Drains/Resovoirs) ×1
TUBE ET 7MM NASAL ORAL MRPH EYE CLOSE FIT LOW PRESS OPQ CUF SHRD CF PVC STRL LTX DISP (AIR) ×1 IMPLANT
TUBE ET SHRD CF 7MM MRPH EYE C_LOSE FIT LOW PRESS OPQ CUF PVC (AIR) ×1
TUBE SUCT CONNECT 1/4INX20_N620A 20EA/CS (Suction) ×1
TUBING DRAIN MONITOR PK EVACUATE COLOR OVR TIME QUINTUBE TLS SYS (Drains/Resovoirs) ×1 IMPLANT
TUBING SUCT CLR 20FT .25IN MEDIVAC MXGR RIGID MALE TO MALE CONN STRL LF  DISP (Suction) ×1 IMPLANT
WATER STRL 1000ML PLASTIC PR BTL LF (SOLUTIONS) IMPLANT
WATER STRL 1000ML PLASTIC PR B_TL LF (SOLUTIONS)

## 2018-09-07 NOTE — H&P (Signed)
[facility]  H&P Update Form    Monique [name], Monique [name], 52 y.o. female  Date of Admission:  09/07/2018  Date of Birth:  10-08-1966    09/07/2018    The completed history and physical from within the past 30 days has been reviewed.  The patient examined and no significant change has occurred in the patient's condition/status since the initial evaluation was completed. We discussed the procedure planned for today, risks etc.  All questions were answered.  The patient wishes to continue with the procedure today and the patient continues to be an appropriate candidate for the planned procedure today.      Drue Novel, MD      This note was created using the [facility] Fluency Direct system and there may be some incorrect words, spelling and/or punctuation not noted/corrected prior to signature

## 2018-09-07 NOTE — Anesthesia Preprocedure Evaluation (Addendum)
ANESTHESIA PRE-OP EVALUATION  Planned Procedure: Right Thyroid Lobectomy possible Total (N/A )  Review of Systems     anesthesia history negative     patient summary reviewed  nursing notes reviewed        Pulmonary     Cardiovascular    Hypertension, past MI, CAD and CABG No peripheral edema,        GI/Hepatic/Renal    GERD     Endo/Other    obesity,   type 2 diabetes     Neuro/Psych/MS    CVA and anxiety     Cancer                   Physical Assessment      Patient summary reviewed and Nursing notes reviewed   Airway       Mallampati: II      Neck ROM: full  Mouth Opening: fair.            Dental           (+) edentulous           Pulmonary    Breath sounds clear to auscultation  (-) no rhonchi, no decreased breath sounds, no wheezes, no rales and no stridor     Cardiovascular    Rhythm: regular  Rate: Normal  (-) no friction rub, carotid bruit is not present, no peripheral edema and no murmur     Other findings            Plan  Planned anesthesia type: GETA    ASA 3     Intravenous induction     Anesthetic plan and risks discussed with patient.     Anesthesia issues/risks discussed are: Dental Injuries and PONV.        Patient's NPO status is appropriate for Anesthesia.           Plan discussed with CRNA.

## 2018-09-07 NOTE — OR Surgeon (Signed)
Operative Report      Patient Name:  Monique [name]  Age:  52 y.o.  DOB:  03-10-1966  MRN:  [id number]    Date of Procedure:  09/07/2018       Procedure:   Right thyroid lobectomy with isthmus    Pre-op Diagnosis:   Dominant, suspicious right thyroid mass    Post-op Diagnosis:  Benign follicular adenoma  - final pathology pending    Anesthesia:  General endotracheal anesthesia    Surgeon:  Drue Novel, MD     Complications:   None    Specimens:   Right thyroid lobe with isthmus    Drains:   TLS drain    EBL:   Minimal    Procedure in detail:                   The patient was taken to the operating theater and placed in supine position. General endotracheal anesthesia was smoothly induced without difficulty. A transverse suprasternal incision was performed sharply through the skin and subcutaneous tissue. The platysma was divided with electrocautery. The sub platysmal flaps raised to the appropriate levels superiorly and inferiorly.  The self retraining retractors were placed to facilitate our exposure.  The midline dissection was then carried out through the strap musculature down to the thyroid isthmus with electrocautery.  The strap musculature was freed from the underlying anterior surface of the thyroid lobes bilaterally. The left thyroid lobe was grossly normal. The right thyroid lobe was noted to enlarged with the dominant, primarily inferior lobe mass lesion.  The dissection was carried out initially mobilizing the middle thyroid vein which was divided immediately adjacent to the thyroid capsule.  The initial dissection was undertaken on the right.  The inferior pole vasculature was then  addressed on this side and the  inferior parathyroid gland was identified as well. The inferior pole vasculature was divided immediately adjacent to the thyroid capsule as well with the LigaSure small jaw shears.  The attention was then paid to the superior pole vasculature,  which was again identified along with the  right superior parathyroid gland. A combination of blunt dissection with both the right angle dissect her and the Kittner dissected were utilized throughout all of the dissections.   The superior pole vasculature was divided immediately adjacent to thyroid capsule once again.  The superior parathyroid gland was identified and preserved.  We then mobilized the gland further medially identifying the recurrent laryngeal nerve without difficulty. It was preserved throughout its course. The thyroid isthmus was then divided after freeing the ligaments of Berry at the level of the isthmus with blunt dissection. The isthmus after was mobilized was then divided with the LigaSure small jaw shears.  The trachea was preserved during this dissection. The mobilized thyroid lobe was then rolled further medially freeing the remaining ligaments of Berry.  There was a small portion of thyroid tissue immediately adjacent to the recurrent laryngeal nerve at its insertion point which was left in situ rather than risking the continuity of the nerve. The thyroid lobe was then sent to pathology for frozen section evaluation. The frozen section report was returned as likely benign follicular adenoma.  The cervical incision  was irrigated thoroughly with antibiotic irrigation.  Hemostasis remained satisfactory, a TLS drain was brought out through a separate stab wound incision and placed in the paratracheal space. It was secured to the skin with a 3-0 silk simple suture. The frozen section report was returned as benign.  After further irrigation of the cervical wound with antibiotic irrigation;  hemostasis remained satisfactory.  The strap musculature was then approximated midline with 2-0 Vicryl simple interrupted suture. The platysma was then approximated with 3-0 Vicryl interrupted suture and the skin was then approximated with a 4-0 Monocryl running subcuticular suture. Skin adhesive was applied to the incision and appropriate dressings  applied. Final instrument, sponge and needle counts were correct x2 at the conclusion the case. The patient was awakened and taken to recovery room in stable condition having tolerated the procedure well.    Drue Novel, MD      This note was created using the [facility] Fluency Direct system and there may be some incorrect words, spelling and/or punctuation not noted/corrected prior to signature

## 2018-09-07 NOTE — Nurses Notes (Signed)
Patient received from PACU. Patient drowsy but responds to verbal stimuli. Oriented x4. Respirations even and unlabored on 3L per NC. LR infusing at 100 to LFA, site WDL. Rates pain "6" on scale of 0-10, describes it as achy. Incision to base of anterior neck well approximated, without redness. TLS drain in place, draining sanguinous fluid. SCDs in place. No needs expressed at this time. All safety measures initiated.

## 2018-09-07 NOTE — Care Plan (Signed)
Problem: Adult Inpatient Plan of Care  Goal: Plan of Care Review  Outcome: Ongoing (see interventions/notes)  Goal: Patient-Specific Goal (Individualization)  Outcome: Ongoing (see interventions/notes)  Goal: Absence of Hospital-Acquired Illness or Injury  Outcome: Ongoing (see interventions/notes)  Goal: Optimal Comfort and Wellbeing  Outcome: Ongoing (see interventions/notes)  Goal: Rounds/Family Conference  Outcome: Ongoing (see interventions/notes)     Problem: Pain Acute  Goal: Optimal Pain Control  Outcome: Ongoing (see interventions/notes)     Problem: Wound  Goal: Optimal Wound Healing  Outcome: Ongoing (see interventions/notes)

## 2018-09-07 NOTE — Anesthesia Postprocedure Evaluation (Signed)
Anesthesia Post Op Evaluation    Patient: Monique [name]  Procedure(s):  Right Thyroid Lobectomy    Last Vitals:Temperature: 36.3 C (97.3 F) (09/07/18 1145)  Heart Rate: 60 (09/07/18 1145)  BP (Non-Invasive): (!) 103/56 (09/07/18 1145)  Respiratory Rate: 13 (09/07/18 1145)  SpO2: 99 % (09/07/18 1145)    Patient is sufficiently recovered from the effects of anesthesia to participate in the evaluation and has returned to their pre-procedure level.  Patient location during evaluation: PACU   Post-procedure handoff checklist completed    Patient participation: complete - patient participated  Level of consciousness: awake and alert and responsive to verbal stimuli  Pain management: adequate  Airway patency: patent  Anesthetic complications: no  Cardiovascular status: acceptable  Respiratory status: acceptable  Hydration status: acceptable  Patient post-procedure temperature: Pt Normothermic   PONV Status: Absent

## 2018-09-07 NOTE — Care Plan (Signed)
Problem: Adult Inpatient Plan of Care  Goal: Plan of Care Review  09/07/2018 1823 by Benjiman Core, RN  Outcome: Ongoing (see interventions/notes)  09/07/2018 1219 by Benjiman Core, RN  Outcome: Ongoing (see interventions/notes)  Goal: Patient-Specific Goal (Individualization)  09/07/2018 1823 by Benjiman Core, RN  Outcome: Ongoing (see interventions/notes)  09/07/2018 1219 by Benjiman Core, RN  Outcome: Ongoing (see interventions/notes)  Goal: Absence of Hospital-Acquired Illness or Injury  09/07/2018 1823 by Benjiman Core, RN  Outcome: Ongoing (see interventions/notes)  09/07/2018 1219 by Benjiman Core, RN  Outcome: Ongoing (see interventions/notes)  Goal: Optimal Comfort and Wellbeing  09/07/2018 1823 by Benjiman Core, RN  Outcome: Ongoing (see interventions/notes)  09/07/2018 1219 by Benjiman Core, RN  Outcome: Ongoing (see interventions/notes)  Goal: Rounds/Family Conference  09/07/2018 1823 by Benjiman Core, RN  Outcome: Ongoing (see interventions/notes)  09/07/2018 1219 by Benjiman Core, RN  Outcome: Ongoing (see interventions/notes)     Problem: Pain Acute  Goal: Optimal Pain Control  09/07/2018 1823 by Benjiman Core, RN  Outcome: Ongoing (see interventions/notes)  09/07/2018 1219 by Benjiman Core, RN  Outcome: Ongoing (see interventions/notes)     Problem: Wound  Goal: Optimal Wound Healing  09/07/2018 1823 by Benjiman Core, RN  Outcome: Ongoing (see interventions/notes)  09/07/2018 1219 by Benjiman Core, RN  Outcome: Ongoing (see interventions/notes)     Problem: Fall Injury Risk  Goal: Absence of Fall and Fall-Related Injury  Outcome: Ongoing (see interventions/notes)

## 2018-09-07 NOTE — OR PostOp (Signed)
Sleeping on arrival  Skin w/d, lips and nailbeds pink  resp easy and unlabored  Incision line to mid neck well approx with skin glue intact, neck soft  TLS drain to mid upper chest  intact,draining small amount serosanguineous drainage, dressing dry and intact  abd soft  IV to LFA with 1000cc LR   No signs of discomfort  1045 awake and alert  Oriented  Swallowing without difficulty  Skin w/d, lips and nailbeds pink  resp easy and unlabored  C/o aching pain to neck  1053 medicated for pain  1100 c/o nausea  resp easy and unlabored  Medicated for nausea  1115 c/o continues pain to neck  Medicated for pain  1130 sleeping  resp easy and unlabored  1145 awake and alert  Skin w/d, lips and nailbeds pink  resp easy and unlabored  Incision line to base of neck well approx with skin glue intact, neck soft  TLS drain intact to mid upper chest, draining small amount sanguineous drainage, dressing dry and intact  Speech clear,swallowing without difficulty  abd soft  Rates pain 5/10, tol  100cc LR infused  Awaiting transport

## 2018-09-07 NOTE — Nurses Notes (Signed)
Lying in bed. Alert and oriented. Incision to neck well approximated and no redness or drainage noted, open to air. TLS drain is draining sanguineous fluid. Gauze dressing intact no drainage noted. Tolerating fluids and food well. Complaints of mild pain and requests pain medicine at bedtime. Ambulated to restroom with standby assistance without difficulty. Advised to call for assistance when needing to go to restroom. Patient agreeable.  Tanda Rockers, RN  09/07/2018, 22:29

## 2018-09-07 NOTE — Anesthesia Transfer of Care (Signed)
ANESTHESIA TRANSFER OF CARE   Monique [name] is a 52 y.o. ,female, Weight: 98.2 kg (216 lb 6.4 oz)   had Procedure(s):  Right Thyroid Lobectomy  performed  09/07/18   Primary Service: Drue Novel, MD    Past Medical History:   Diagnosis Date   . Anxiety    . BMI 35.0-35.9,adult    . Carotid stenosis    . Chronic back pain    . Coronary artery disease    . DDD (degenerative disc disease), lumbar     "entire spine"   . DDD (degenerative disc disease), lumbar    . Ear piercing    . GERD (gastroesophageal reflux disease)    . H/O complete eye exam     2 years, Dr. [name], at [facility]   . History of dental examination     dentures upper and lower   . HTN    . Hyperlipidemia    . MI (myocardial infarction) (CMS [place]) 2012   . Neck problem     herniated cervical disc   . Obesity    . 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 [place] ordered.    . Stroke ([facility])     2002   . Tattoo     Left breast, Right shoulder   . Type 2 diabetes mellitus     Dx 1999 average fasting 70-89   . Type 2 diabetes mellitus (CMS [place])       Allergy History as of 09/07/18     NO KNOWN DRUG ALLERGIES       Noted Status Severity Type Reaction    09/07/18 0806 Devol, Summer, RN  Deleted       11/23/07   Active             ACETAMINOPHEN-CODEINE       Noted Status Severity Type Reaction    09/07/18 0753 Devol, Summer, RN 09/07/18 Active       Comments:  pt states that one time she had chest pains with this med, but dr. thought it was because she had not eaten               I completed my transfer of care / handoff to the receiving personnel during which we discussed:   Last OR Temp: Temperature: 36.4 C (97.5 F)  ABG:  PH   Date Value Ref Range Status   04/01/2011 7.430 7.350 - 7.450 Final     PCO2   Date Value Ref Range Status   04/01/2011 35.0 33.1 - 43.1 mm Hg Final   03/31/2011 41.0 41.0 - 51.0 mm Hg Final     PO2   Date Value Ref Range Status   04/01/2011 52 (L) 72 - 100 mm Hg Final      03/31/2011 252 (H) 35 - 50 mm Hg Final     SODIUM   Date Value Ref Range Status   03/31/2011 137 136 - 145 mmol/L Final     POTASSIUM   Date Value Ref Range Status   08/30/2018 4.6 3.6 - 5.0 mmol/L Final     Comment:     Potassium Reference Ranges pertain to serum only.   Plasma results are 0.1 to 0.7 mmol/L lower than serum ranges.   04/16/2011 4.1 3.5 - 5.1 mmol/L Final     KETONES   Date Value Ref Range Status   04/15/2018 Not Detected Not Detected mg/dL Final  03/24/2011 NEGATIVE NEGATIVE mg/dL Final     WHOLE BLOOD K+   Date Value Ref Range Status   04/01/2011 4.4 3.5 - 5.0 mmol/L Final     Comment:     K+ results obtained via GEM 3500 analyzer.     CHLORIDE   Date Value Ref Range Status   03/31/2011 111 96 - 111 mmol/L Final     CALCIUM   Date Value Ref Range Status   08/30/2018 9.7 8.5 - 10.3 mg/dL Final   04/16/2011 8.7 8.5 - 10.4 mg/dL Final     Comment:     Test performed by [facility], [address], [place], [place]  [address]     Calculated P Axis   Date Value Ref Range Status   04/04/2018 52 degrees Final     Calculated R Axis   Date Value Ref Range Status   08/30/2018 69 degrees Final     Calculated T Axis   Date Value Ref Range Status   08/30/2018 31 degrees Final     IONIZED CALCIUM   Date Value Ref Range Status   04/01/2011 1.15 (L) 1.30 - 1.46 mmol/L Final     GLUCOSE, POINT OF CARE   Date Value Ref Range Status   04/06/2011 128 (H) 70 - 105 mg/dL Final     Comment:     RN Notifiedbefore meal     LACTATE   Date Value Ref Range Status   03/31/2011 2.0 (H) 0.9 - 1.7 mmol/L Final     HEMOGLOBIN   Date Value Ref Range Status   04/01/2011 10.7 (L) 12.0 - 16.0 g/dL Final   03/31/2011 7.6 (L) 12.0 - 16.0 g/dL Final     OXYHEMOGLOBIN   Date Value Ref Range Status   04/01/2011 88.6 85.0 - 98.0 % Final     O2HB   Date Value Ref Range Status   03/31/2011 96.1 (HH) 40.0 - 70.0 % Final     CARBOXYHEMOGLOBIN   Date Value Ref Range Status   04/01/2011 1.8 0.0 - 2.5 % Final     CARBOHYHEMOGLOBIN   Date  Value Ref Range Status   03/31/2011 1.1 0.0 - 1.5 % Final     MET-HEMOGLOBIN   Date Value Ref Range Status   04/01/2011 1.3 0.0 - 3.0 % Final   03/31/2011 1.3 0.0 - 3.0 % Final     BASE EXCESS   Date Value Ref Range Status   04/01/2011 Test Not Performed 0.0 - 3.0 mmol/L Final   03/31/2011 Test Not Performed 0.0 - 2.0 mmol/L Final     BASE DEFICIT   Date Value Ref Range Status   04/01/2011 0.8 0.0 - 3.0 mmol/L Final   03/31/2011 4.6 (H) 0.0 - 2.0 mmol/L Final     BICARBONATE   Date Value Ref Range Status   04/01/2011 24.1 18.0 - 29.0 mmol/L Final   03/31/2011 21.4 (LL) 22.0 - 26.0 mmol/L Final     TEMPERATURE, COMP   Date Value Ref Range Status   04/01/2011 37.0 15.0 - 40.0 C Final     %FIO2   Date Value Ref Range Status   04/01/2011 44 21 - 100 % Final   03/31/2011 100 % Final     Airway:* No LDAs found *  Blood pressure (!) 109/55, pulse 76, temperature 36.4 C (97.5 F), resp. rate 15, height 1.676 m (_0 ), weight 98.2 kg (216 lb 6.4 oz), SpO2 (!) 9 %, not currently breastfeeding.

## 2018-09-08 LAB — HISTORICAL SURGICAL PATHOLOGY SPECIMEN

## 2018-09-08 MED ORDER — OXYCODONE-ACETAMINOPHEN 5 MG-325 MG TABLET: 1 | Tab | Freq: Four times a day (QID) | ORAL | 0 refills | 0 days | Status: DC | PRN

## 2018-09-08 MED ADMIN — lisinopriL 5 mg tablet: ORAL | @ 08:00:00

## 2018-09-08 MED ADMIN — sodium chloride 0.9 % (flush) injection syringe: @ 06:00:00

## 2018-09-08 NOTE — Progress Notes (Signed)
[facility]  Surgical  Progress  Note       Patient Name:  Monique [name]  Age:  52 y.o.  DOB:  03/24/1966  MRN:  [id number]  Date:  09/08/2018      Postoperative day 1:       Patient is awake and alert.  She has been tolerating her diet without difficulty.  She has been up and about as well.  She does complain of an intermittently raspy voice.  She is afebrile and her hemodynamics are satisfactory.  There has been minimal drain output as well.  The incision appears satisfactory.  Her follow-up laboratories are noted and satisfactory.  At this point, we discussed the operative findings and the frozen section benign results.  I think it is reasonable to discharge home today and she is in agreement as well.  We discussed discharge planning and follow-up.  All questions were answered and she wishes to proceed with discharge today.      Drue Novel, MD        This note was created using the [facility] Fluency Direct system and there may be some incorrect words, spelling and/or punctuation not noted/corrected prior to signature

## 2018-09-08 NOTE — Care Plan (Signed)
Problem: Adult Inpatient Plan of Care  Goal: Plan of Care Review  09/08/2018 0559 by Tanda Rockers, RN  Outcome: Ongoing (see interventions/notes)  09/08/2018 0541 by Tanda Rockers, RN  Outcome: Ongoing (see interventions/notes)  Goal: Patient-Specific Goal (Individualization)  09/08/2018 0559 by Tanda Rockers, RN  Outcome: Ongoing (see interventions/notes)  09/08/2018 0541 by Tanda Rockers, RN  Outcome: Ongoing (see interventions/notes)  Goal: Absence of Hospital-Acquired Illness or Injury  09/08/2018 0559 by Tanda Rockers, RN  Outcome: Ongoing (see interventions/notes)  09/08/2018 0541 by Tanda Rockers, RN  Outcome: Ongoing (see interventions/notes)  Goal: Optimal Comfort and Wellbeing  09/08/2018 0559 by Tanda Rockers, RN  Outcome: Ongoing (see interventions/notes)  09/08/2018 0541 by Tanda Rockers, RN  Outcome: Ongoing (see interventions/notes)  Goal: Rounds/Family Conference  09/08/2018 0559 by Tanda Rockers, RN  Outcome: Ongoing (see interventions/notes)  09/08/2018 0541 by Tanda Rockers, RN  Outcome: Ongoing (see interventions/notes)     Problem: Pain Acute  Goal: Optimal Pain Control  09/08/2018 0559 by Tanda Rockers, RN  Outcome: Ongoing (see interventions/notes)  09/08/2018 0541 by Tanda Rockers, RN  Outcome: Ongoing (see interventions/notes)     Problem: Wound  Goal: Optimal Wound Healing  09/08/2018 0559 by Tanda Rockers, RN  Outcome: Ongoing (see interventions/notes)  09/08/2018 0541 by Tanda Rockers, RN  Outcome: Ongoing (see interventions/notes)     Problem: Fall Injury Risk  Goal: Absence of Fall and Fall-Related Injury  09/08/2018 0559 by Tanda Rockers, RN  Outcome: Ongoing (see interventions/notes)  09/08/2018 0541 by Tanda Rockers, RN  Outcome: Ongoing (see interventions/notes)

## 2018-09-08 NOTE — Care Plan (Signed)
Resting in bed with eyes closed. Requested pain medicine at 0224. Given. Has been resting since with no further complaints. Incision well approximated with no drainage noted. Drain is draining sanguineous fluid. Call light within reach.   Tanda Rockers, RN  09/08/2018, 06:01

## 2018-09-08 NOTE — Nurses Notes (Signed)
tls drain removed as ordered.   Scant amt clear pink drainage in tube.  Incision intact with no redness or drainage noted.   Discharge instructions given.   Pt discharged to home via wheelchair with husband.

## 2018-09-13 ENCOUNTER — Encounter (HOSPITAL_BASED_OUTPATIENT_CLINIC_OR_DEPARTMENT_OTHER): Payer: Self-pay | Admitting: Family Medicine

## 2018-09-13 DIAGNOSIS — I10 Essential (primary) hypertension: Secondary | ICD-10-CM | POA: Insufficient documentation

## 2018-09-13 DIAGNOSIS — E119 Type 2 diabetes mellitus without complications: Secondary | ICD-10-CM | POA: Insufficient documentation

## 2018-09-13 DIAGNOSIS — E669 Obesity, unspecified: Secondary | ICD-10-CM | POA: Insufficient documentation

## 2018-09-13 DIAGNOSIS — I2510 Atherosclerotic heart disease of native coronary artery without angina pectoris: Secondary | ICD-10-CM | POA: Insufficient documentation

## 2018-09-13 DIAGNOSIS — K219 Gastro-esophageal reflux disease without esophagitis: Secondary | ICD-10-CM | POA: Insufficient documentation

## 2018-09-13 DIAGNOSIS — I639 Cerebral infarction, unspecified: Secondary | ICD-10-CM | POA: Insufficient documentation

## 2018-09-13 DIAGNOSIS — E1100 Type 2 diabetes mellitus with hyperosmolarity without nonketotic hyperglycemic-hyperosmolar coma (NKHHC): Secondary | ICD-10-CM | POA: Insufficient documentation

## 2018-09-13 DIAGNOSIS — E785 Hyperlipidemia, unspecified: Secondary | ICD-10-CM | POA: Insufficient documentation

## 2018-09-13 NOTE — Progress Notes (Signed)
[place], [address]  [place]  [place] [address]    Medicare Annual Wellness Visit    Name: Monique [name] MRN:  [id number]   Date: 09/16/2018 Age: 52 y.o.     Chief Complaint   Patient presents with   . Medicare Annual     [phone] - Patient reports that she averages 6-8 hours of sleep a night.  Her appetite is good.  Her bowels are working normally at this time with no complaints.  Seh has urinary frequency due to her lasix.  Her pain level today is a 8.       HPI:  The history is provided by the patient and medical records. No language interpreter was used.       SUBJECTIVE:   Monique [name] is a 52 y.o. female presenting for a Medicare Wellness exam.   I have reviewed and reconciled the medication list with the patient today.    I have reviewed and updated as appropriate the past medical, family, and social history. 09/16/2018 as summarized below:  Past Medical History:   Diagnosis Date   . Anxiety    . Carotid stenosis    . Chronic back pain    . Coronary artery disease    . DDD (degenerative disc disease), lumbar     "entire spine"   . Ear piercing    . GERD (gastroesophageal reflux disease)    . H/O complete eye exam     2 years, Dr. [name], at [facility]   . History of dental examination     dentures upper and lower   . HTN    . Hyperlipidemia    . MI (myocardial infarction) (CMS [facility]) 2012   . Neck problem     herniated cervical disc   . Obesity (BMI 30-39.9)    . 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 [place] ordered.    . Stroke (CMS [facility])     2002   . Tattoo     Left breast, Right shoulder   . Type 2 diabetes mellitus (CMS HCC)      Past Surgical History:   Procedure Laterality Date   . Endometrial ablation  1998   . Hx ankle fracture tx  1990s   . Hx cataract removal  2005   . Hx cesarean section  06/20/2000   . Hx cholecystectomy  1988   . Hx coronary artery bypass graft  03/31/2011   . Hx coronary stent placement  2012   . 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 Hx heart catheterization  2012   . Hx tonsillectomy       Current Outpatient Medications   Medication Sig   . ACCU-CHEK AVIVA PLUS TEST STRP Strip 1 Strip by Subcutaneous route Three times a day as needed   . BYDUREON 2 mg Subcutaneous 2 mg by Subcutaneous route Every 7 days   . clopidogrel (PLAVIX) 75 mg Oral Tablet Take 1 Tab (75 mg total) by mouth Once a day   . cyanocobalamin (VITAMIN B12) 1,000 mcg/mL Injection Solution 1 mL (1,000 mcg total) by Subcutaneous route Every 30 days   . diclofenac sodium (VOLTAREN) 1 % Gel 2 g by Apply Topically route Four times a day - before meals and bedtime (Patient taking differently: 2 g by Apply Topically route Three times a day as needed )   . docusate sodium (COLACE) 100  mg Oral Capsule Take 1 Cap (100 mg total) by mouth Twice daily   . ergocalciferol, vitamin D2, (DRISDOL) 50,000 unit Oral Capsule Take 1 Cap (50,000 Units total) by mouth Every 7 days   . flash glucose scanning reader (FREESTYLE LIBRE 14 DAY READER) Does not apply Misc For daily use for 14 days.   . flash glucose sensor (FREESTYLE LIBRE 14 DAY SENSOR) Does not apply Kit For daily use for 14 days.   . furosemide (LASIX) 40 mg Oral Tablet Take 1 Tab (40 mg total) by mouth Once a day   . HYDROcodone-acetaminophen (NORCO) 10-325 mg Oral Tablet Take 1 Tab by mouth Four times a day    . Ibuprofen (MOTRIN) 800 mg Oral Tablet Take 1 Tab (800 mg total) by mouth Three times a day as needed for Pain   . lisinopril (PRINIVIL) 2.5 mg Oral Tablet take 1 tablet by mouth once daily   . loratadine (CLARITIN) 10 mg Oral Tablet Take 1 Tab (10 mg total) by mouth Once a day (Patient taking differently: Take 10 mg by mouth Once per day as needed )   . metFORMIN (GLUCOPHAGE) 500 mg Oral Tablet Take 1 Tab (500 mg total) by mouth Twice daily with food   . metoprolol succinate (TOPROL-XL) 25 mg Oral Tablet Sustained Release 24 hr TAKE 1/2 TABLET ONCE DAILY (Patient taking differently: Once a day 1/2 tab  daily)   . niacin (NIASPAN) 500 mg Oral Tablet Sustained Release Take 1 Tab (500 mg total) by mouth Once a day   . nitroGLYCERIN (NITROSTAT) 0.4 mg Sublingual Tablet, Sublingual 1 Tab (0.4 mg total) by Sublingual route Every 5 minutes as needed for Chest pain for 3 doses over 15 minutes   . omeprazole (PRILOSEC) 40 mg Oral Capsule, Delayed Release(E.C.) Take 1 Cap (40 mg total) by mouth Once a day   . ondansetron (ZOFRAN ODT) 8 mg Oral Tablet, Rapid Dissolve Take 1 Tab (8 mg total) by mouth Every 8 hours as needed for nausea/vomiting   . PRENATAL PLUS, CALCIUM CARB, 27 mg iron- 1 mg Oral Tablet Take 1 Tab by mouth Once a day   . raNITIdine (ZANTAC) 150 mg Oral Tablet Take 1 Tab (150 mg total) by mouth Twice daily   . rosuvastatin (CRESTOR) 20 mg Oral Tablet take 1 tablet by mouth once daily (Patient taking differently: Take 20 mg by mouth Once a day )   . VENTOLIN HFA 90 mcg/actuation Inhalation HFA Aerosol Inhaler Take 1-2 Puffs by inhalation Every 6 hours as needed (wheezing, cough)     Family Medical History:     Problem Relation (Age of Onset)    Atrial fibrillation Sister    Colon Cancer Father    Diabetes Mother    Heart Disease Sister, Maternal Grandfather    Hypertension (High Blood Pressure) Mother    Lung Cancer Maternal Grandfather    MI <38 years of age Mother            Social History     Socioeconomic History   . Marital status: Married     Spouse name: [name]   . Number of children: 3   . Years of education: completed 11th grade   . Highest education level: Not on file   Occupational History   . Occupation: disabled   Social Needs   . Financial resource strain: Not hard at all   . Food insecurity:     Worry: Never true     Inability: Never  true   . Transportation needs:     Medical: No     Non-medical: No   Tobacco Use   . Smoking status: Current Every Day Smoker     Packs/day: 0.50     Years: 20.00     Pack years: 10.00     Types: Cigarettes     Start date: 59   . Smokeless tobacco: Never Used      Substance and Sexual Activity   . Alcohol use: No     Frequency: Never     Binge frequency: Never   . Drug use: No   . Sexual activity: Yes     Partners: Male   Lifestyle   . Physical activity:     Days per week: 0 days     Minutes per session: 0 min   . Stress: To some extent   Relationships   . Social connections:     Talks on phone: More than three times a week     Gets together: More than three times a week     Attends religious service: Never     Active member of club or organization: No     Attends meetings of clubs or organizations: Never     Relationship status: Married   . Intimate partner violence:     Fear of current or ex partner: No     Emotionally abused: No     Physically abused: No     Forced sexual activity: No   Other Topics Concern   . Routine Exercise No   . Ability to Walk 2 Flight of Steps without SOB/CP Yes   Social History Narrative    Living situation: lives at home with husband, [name], and daughter.  1 dog named Duke and 1 cat named Lucky.    Nutrition:  Eats 5 small meals d/t gastric sleeve surgery.     Caffeine use: none, decaf coffee    Exercise: stays busy around house. No formal form of exercise    Seatbelt use: sometimes    Fire extinguishers in the home: yes    Smoke alarms in the home: yes    Carbon monoxide detectors in the home: yes    Nancy Fetter exposure: sunglasses        Natallie would like for husband, Krystol Rocco,  to speak for her in the event she would become incapacitated.    Full code.        Review of Systems   HENT:        Headache status post partial thyroidectomy   All other systems reviewed and are negative.       List of Current Health Care Providers   Care Team     PCP     Name Type Specialty Phone Number    Cherly Beach, MD Physician [place] [phone]          Care Team     Name Type Specialty Phone Number    Cherly Beach, MD Physician FAMILY PRACTICE [phone]    Garlan Fair, MD Physician CARDIOLOGY [phone]    Parthenia Ames, MD Not  available GASTROENTEROLOGY [phone]    Rosaria Ferries., MD Not available PAIN MANAGEMENT [phone]                  Health Maintenance   Topic Date Due   . Adult Tdap-Td (1 - Tdap) Patient due now.  Have discussed risks and benefits with the patient.   Patient  will discuss with pharmcist and obtain there if decides to have.    . Pap smear  patient will make self referral   . Hepatitis C screening antibody  Patient is due.  Have discussed risk and benefits of not having performed.  Patient verbalized awareness.  Declines at this time.    . Shingles Vaccine (1 of 2) Patient due now.  Have discussed risks and benefits with the patient.   Patient will discuss with pharmcist and obtain there if decides to have.    . Mammography  ordered today   . Influenza Vaccine (1) given today   . Annual Wellness Exam  10.10.2020   . Depression Screening  10.10.2020   . Fecal DNA Test  04/29/2020   . Pneumococcal 19-64 Years Medium Risk  Completed   . HIV Screening  Discontinued     Medicare Wellness Assessment   Medicare initial or wellness physical in the last year?: Yes  Advance Directives (optional)   Does patient have a living will or MPOA: no   Has patient provided [employer] with a copy?: no   Advance directive information given to the patient today?: YES      Activities of Daily Living   Do you need help with dressing, bathing, or walking?: No   Do you need help with shopping, housekeeping, medications, or finances?: No   Do you have rugs in hallways, broken steps, or poor lighting?: No   Do you have grab bars in your bathroom, non-slip strips in your tub, and hand rails on your stairs?: Yes   Urinary Incontinence Screen (Women >=65 only)       Cognitive Function Screen (1=Yes, 0=No)   What is you age?: Correct   What is the time to the nearest hour?: Correct   What is the year?: Correct   What is the name of this clinic?: Correct   Can the patient recognize two persons (the doctor, the nurse, home help, etc.)?:  Correct   What is the date of your birth? (day and month sufficient) : Correct   In what year did World War II end?: Incorrect   Who is the current president of the [place]?: Correct   Count from 20 down to 1?: Correct   What address did I give you earlier?: Correct   Total Score: 9   Hearing Screen   Have you noticed any hearing difficulties?: No  After whispering 9-1-6 how many numbers did the patient repeat correctly?: 3  After whispering 4-7-8 how many numbers did the patient repeat correctly?: 3   Fall Risk Screen   Do you feel unsteady when standing or walking?: No  Do you worry about falling?: No  Have you fallen in the past year?: No   Vision Screen   Right Eye = 20: 20(referral to Dr. [name] II made today)   Left Eye = 20: 20   Depression Screen     Little interest or pleasure in doing things.: Not at all  Feeling down, depressed, or hopeless: Not at all  PHQ 2 Total: 0  Trouble falling or staying asleep, or sleeping too much.: Not at all  Feeling tired or having little energy: Not at all  Poor appetite or overeating: Not at all  Feeling bad about yourself/ that you are a failure in the past 2 weeks?: Not at all  Trouble concentrating on things in the past 2 weeks?: Not at all  Moving/Speaking slowly or being fidgety or restless  in the past 2 weeks?: Not at all  Thoughts that you would be better off DEAD, or of hurting yourself in some way.: Not at all  PHQ 9 Total: 0  Interpretation of Total Score: 0-4 No depression          During the past 4 weeks, how would you rate your health in general?: Good   During the past 4 weeks, how much difficulty have you had doing your usual activities inside and outside your home because of medical or emotional problems?: No difficulty at all   During the past 4 weeks, was someone available to help you if you needed and wanted help?: Yes, as much as I wanted   In the past year, how many times have you gone to the emergency department or been admitted to a hospital for  a health problem?: 2-4 times       During the past 4 weeks, how much bodily pain have you typically had?: Moderate pain   Do you have enough money to buy things you need in everyday life, such as food, clothing, medicines, and housing?: Yes, always   Do you have enough money to buy things you need in everyday life, such as food, clothing, medicines, and housing?: Yes, always   Can you get to places beyond walking distance without help?  (For example, can you drive your own car or travel alone on buses)?: Yes   Do you fasten your seatbelt when you are in a car?: Yes, sometimes                           Do you exercise 20 minutes 3 or more days per week (such as walking, dancing, biking, mowing grass, swimming)?: Yes, some of the time   How often do you eat food that is healthy (fruits, vegetables, lean meats) instead of unhealthy (sweets, fast food, junk food, fatty foods)?: Almost always               Do you need any help communicating with your doctors and nurses because of vision or hearing problems?: No   How often do you find it difficult to take medicines as directed?: I always take them as prescribed           Are you now also seeing any specialist physician(s) (such as eye doctor, foot doctor, skin doctor)?: Yes   If you are seeing a specialist for anything such as foot, eye, skin, etc.  please list their name(s): Dr. [name] II, Dr. Higinio Roger, Dr. Pearlie Oyster, Dr. Hortencia Pilar   How confident are you that you can control or manage most of your health problems?: Very confident   Scoring                     OBJECTIVE:   BP 118/70 (Site: Left, Patient Position: Sitting, Cuff Size: Adult)   Pulse 78   Temp 36.3 C (97.4 F) (Oral)   Resp 12   Ht 1.676 m ('5\' 6"'$ )   Wt 99.2 kg (218 lb 9.6 oz)   LMP  (LMP Unknown)   SpO2 94%   BMI 35.28 kg/m       Physical Examination:    GENERAL:   Pt is a pleasant, well-nourished, well-developed 52 y.o. female who is in NAD. Appears stated age  8:  head normocephalic, symmetrical  facies. EOM intact b/l. PERRLA. Sclera non-icteric, non-injected. No rhinorrhea. Oropharyngeal mucous membranes are moist  w/o erythema/exudates, upper eyelid w/Xythoma-lipid plaques. Stable. Bilateral TM's are retracted and without effusion or erythema.   NECK: supple. No masses, lymphadenopathy, JVD, or carotid bruits on exam. Trachea midline, no thyromegaly   CV:  S1-S2 No murmurs, rubs, or  gallops.   LUNGS: CTAB, No rhonchi, rales, wheezes.   GI: (+) BS in all 4 quadrants. soft, NT/ND.   No rigidity/ guarding/ rebound. No organomegaly/masses, or abdominal bruits  MSK: Thoracic spine: Tenderness to palpation of the lower thoracic and upper lumbar spine with positive Jump sign. No obvious abnormal curvature or step-off deformities. No radicular symptoms down the bilateral arms or around the abdomen.   Spontaneous normal AROM of major joints as observed during exam.   No joint effusions/ swelling/ deformities.    No BLE edema, clubbing, or cyanosis.   2+ radial pulse present b/l.   + 2 reflexes Brachioradialis, patellar bilaterally.   Normal gait  SKIN: No significant lesions, rashes, ecchymoses noted.   Note:midline chest scar consistent w/ CABG, well-healed and well-approximated.    No surrounding erythema or induration.   New midline neck scar consistent with recent thyroidectomy.  NEURO: AAOx4, CN grossly intact. Sensation intact b/l. No focal deficits.  PSYCH: Mood, behavior and affect normal w/ Intact judgement & insight       Diabetes Monitors  A1C: 9.6  A1C Date: 03/29/2018          Urine Microalbumin: 0.54   Microalbumin Date: Not Found    Last Lipid Panel  (Last result in the past 2 years)      Cholesterol   HDL   LDL   Direct LDL   Triglycerides      03/29/18 0958 134  Comment:  Total Cholesterol Classification:     <200        Desirable        200-239     Borderline High        > or = 240  High 52  Comment:  HDL Cholesterol Classification:          <40        Low        > or = 60  High    52  Comment:  LDL Cholesterol Classification:         <100     Optimal       100-129  Near or Above Optimal       130-159  Borderline High       160-189  High       >190     Very High   151  Comment:  Triglyceride Classification:            <150    Normal       150-199 Borderline-High       200-499 High       >500    Very High        Retinal Exam Date: Not Found - referral made today  Last diabetic foot exam: Not Found - PCP to consider at next office visit      ASSESSMENT & PLAN:     Problem List Items Addressed This Visit        Cardiovascular System    Coronary artery disease    HTN (hypertension)    Hypercholesterolemia    Hyperlipidemia    MI (myocardial infarction) (CMS HCC)    S/P CABG x 2       Neurologic    Carotid  stenosis    Stroke (CMS [facility])       Digestive    Constipation    GERD (gastroesophageal reflux disease)       Endocrine    Thyroid nodule    Type 2 diabetes mellitus (CMS HCC)    Xanthelasma       Musculoskeletal    Chronic back pain    DDD (degenerative disc disease), lumbar       Psychiatric    Anxiety       Other    BMI 35.0-35.9,adult    Morbid obesity (CMS HCC)    Obesity (BMI 30-39.9)      Other Visit Diagnoses     Medicare annual wellness visit, subsequent    -  Primary    Need for influenza vaccination        Relevant Orders    Influenza Vaccine IM 0.39m Age 28 Months through Adult(Admin)    Breast cancer screening        [place]abetes mellitus [place]               Identified Risk Factors/ Recommended Actions   BMI addressed: Advised on diet, weight loss, and exercise to reduce above normal BMI.    Tobacco cessation counseling performed.      Depression screening follow up plan of care: Follow up with Dr. HRuss Halo  PHQ9 score - 0      High Risk Medications were discussed with the patient.  Discussed the risks associated with the medications - falls and bleeding. Discussed the benefits vs risks of each medication with the patient and patient chooses to continue medications as  prescribed.    Orders Placed This Encounter   . Influenza Vaccine IM 0.519mAge 28 Months through Adult(Admin)          Immunization administered     Name Date Dose VIS Date Route    INFLUENZA VACCINE IM  Incomplete 0.5 mL 07/22/2018 Intramuscular          The ASCVD Risk score (GMikey BussingC Jr., et al., 2013) failed to calculate for the following reasons:    The patient has a prior MI or stroke diagnosis    Preventative health care was explained in detail. Age appropriate vaccinations and cancer screenings were described in detail. We discussed fall prevention and ways to ensure home safety. We discussed smoke detectors, carbon monoxide detectors, and the need for a fire extinguisher within the home. We discussed health maintenance screenings that are applicable at this time. We discussed medication compliance and have attempted to simplify medications as much as possible. Medical living will and power of attorney was discussed. Patient was encouraged to wear seatbelt and exhibit car safety. Driving safety was discussed. Exercise and healthy eating was discussed. Patient was told about Calcium and Vitamin D recommendations based on current age and gender.     The patient has been educated about risk factors and recommended preventive care. Written Prevention Plan completed/updated and given to patient (see After Visit Summary).    Headache-  Ongoing since thyroidectomy  Patient has follow-up in 1 week with general surgeon  Given her symptoms are good check a thyroid today and a calcium level today.  Discussed mechanical problems  Offered reassurance.    Next office visit: Review and discuss labs. Re-evaluate chronic medical conditions.     Return in 27 days (on 10/13/2018) for at next previously scheduled office visit with pcp.    AnNance PearMA  CCM PATRIOT  [place], [place]  [address]  [place] [address]  [phone]    Cherly Beach, MD  09/16/2018, 13:25    I personally  interviewed and examined this patient I have reviewed the above document with Nance Pear, MA and I am agreement w/ contents and plan.

## 2018-09-13 NOTE — Patient Instructions (Addendum)
Medicare Preventive Services  Medicare coverage information Recommendation for YOU   Heart Disease and Diabetes   Lipid profile every 5 years or more often if at risk for cardiovascular disease  Last Lipid Panel  (Last result in the past 2 years)      Cholesterol   HDL   LDL   Direct LDL   Triglycerides      03/29/18 0958 134  Comment:  Total Cholesterol Classification:     <200        Desirable        200-239     Borderline High        > or = 240  High 52  Comment:  HDL Cholesterol Classification:          <40        Low        > or = 60  High   52  Comment:  LDL Cholesterol Classification:         <100     Optimal       100-129  Near or Above Optimal       130-159  Borderline High       160-189  High       >190     Very High   151  Comment:  Triglyceride Classification:            <150    Normal       150-199 Borderline-High       200-499 High       >500    Very High         Diabetes Screening  yearly for those at risk for diabetes, 2 tests per year for those with prediabetes Last Glucose: 280    Diabetes Self Management Training or Medical Nutrition Therapy  for those with diabetes, up to 10 hrs initial training within a year, subsequent years up to 2 hrs of follow up training Optional for those with diabetes     Medical Nutrition Therapy three hours of one-on-one counseling in first year, two hours in subsequent years Optional for those with diabetes, kidney disease   Intensive Behavioral Therapy for Obesity  Face-to-face counseling, first month every week, month 2-6 every other week, month 7-12 every month if continued progress is documented Optional for those with Body Mass Index 30 or higher  Your There is no height or weight on file to calculate BMI.   Tobacco Cessation (Quitting) Counseling   two attempts per year, max 4 sessions per attempt, up to 8 per year, for those with tobacco-related health condition Optional for those that use tobacco   Cancer Screening   Colorectal screening   for anyone age 75 to  61 or any age if high risk:  . Screening Colonoscopy every 10 yrs if low risk,  more frequent if higher risk  OR  . Flexible  Sigmoidoscopy  every 5 yr OR  . Fecal Occult Blood Testing yearly OR  . Cologuard Stool DNA test once every 3 years OR  . CT Colonography every 5 yrs    See your schedule below    Cologuard performed 5.23.18; due 2021   Screening Pap Test recommended every 3 years for all women age 22 to 9, or every five years if combined with HPV test (routine screening not needed after total hysterectomy).  Medicare covers every 2 years, up to yearly if high risk.  Screening Pelvic Exam Medicare covers every 2 years,  yearly if high risk or childbearing age with abnormal Pap in last 3 yrs. See your schedule below, Patient is due.  Have discussed risk and benefits of not having performed.  Patient verbalized awareness.  Declines at this time.    Screening Mammogram   Recommended every 1-2 years for women age 42 to 64, and selectively recommended for women between 41-49 based on shared decisions about risk. Covered by Medicare up to every year for women age 55 or older See your schedule below; ordered 10.09.18   Lung Cancer Screening  Annual low dose computed tomography (LDCT scan) is recommended for those age 50-77 who smoked 30 pack-years and are current smokers or quit smoking within past 15 years (one pack-year= smoking one PPD for one year), after counseling by your doctor or nurse clinician about the possible benefits or harms See your schedule below       Vaccinations   Pneumococcal Vaccine recommended routinely age 66+ with two separate vaccines one year apart (Prevnar then Pneumovax).  Recommended before age 72 if medical conditions increase risk  Seasonal Influenza Vaccine once every flu season   Hepatitis B Vaccine 3 doses if risk (including anyone with diabetes or liver disease)  Shingles Vaccine Once or twice at age 19 or older  Diphtheria Tetanus Pertussis Vaccine ONCE as adult, booster every 10  years   Tdap and Shingrix - Patient due now.  Have discussed risks and benefits with the patient.   Patient will discuss with pharmcist and obtain there if decides to have.   Influenza - given today  Immunization History   Administered Date(s) Administered   . Influenza Vaccine IM (ADMIN) 08/08/2010, 10/13/2012, 08/06/2017   . Pneumovax 10/02/2010     Shingles vaccine and Diphtheria Tetanus Pertussis vaccines are available at pharmacies or local health department without a prescription.   Other Screening   Bone Densitometry   every 24 months for anyone at risk, including postmenopausal    age deferred   Glaucoma Screening   yearly if in high risk group such as diabetes, family history, African American age 36+ or Hispanic American age 66+ Follows with Dr. [name] II - referral sent   Hepatitis C Screening recommended ONCE for those born between 1945-1965, or high risk for HCV infection  Patient is due.  Have discussed risk and benefits of not having performed.  Patient verbalized awareness.  Declines at this time.    HIV Testing   yearly or up to 3 times in pregnancy Patient is due.  Have discussed risk and benefits of not having performed.  Patient verbalized awareness.  Declines at this time.    Abdominal Aortic Aneurysm Screening Ultrasound   once with Initial Welcome to Medicare Physical within 12 months of coverage if  79 + with family history of AAA ineligible     Your Personalized Schedule for Preventive Tests   Health Maintenance: Pending and Last Completed       Date Due Completion Date    Adult Tdap-Td (1 - Tdap) Patient due now.  Have discussed risks and benefits with the patient.   Patient will discuss with pharmcist and obtain there if decides to have.  ---    Pap smear patient will make a self referral ---    Hepatitis C screening antibody Patient is due.  Have discussed risk and benefits of not having performed.  Patient verbalized awareness.  Declines at this time.  ---    Shingles Vaccine (1 of 2)  Patient  due now.  Have discussed risks and benefits with the patient.   Patient will discuss with pharmcist and obtain there if decides to have.  ---    Mammography ordered today 12/17/2012    Influenza Vaccine (1) given today 09/15/2017    Annual Wellness Exam 10.10.2020 10.10.2019    Depression Screening 10.10.2020 10.10.2019    Fecal DNA Test 04/29/2020 04/29/2017             Prevention Guidelines, Women Ages 39 to 79  Screening tests and vaccines are an important part of managing your health. A screening test is done to find possible disorders or diseases in people who don't have any symptoms. The goal is to find a disease early so lifestyle changes can be made and you can be watched more closely to reduce the risk of disease, or to detect it early enough to treat it most effectively. Screening tests are not considered diagnostic, but are used to determine if more testing is needed.Health counseling is essential, too. Below are guidelines for these, for women ages 82 to 82. Talk with your healthcare provider to make sure you're up to date on what you need.  Screening Who needs it How often   Type 2 diabetes or prediabetes All women beginning at age 31 and women without symptoms at any age who are overweight or obese and have 1 or more additional risk factors for diabetes. At least every 3 years   Type 2 diabetes or prediabetes All women diagnosed with gestational diabetes Lifelong testing every 3 years   Type 2 diabetes All women with prediabetes Every year   Alcohol misuse All women in this age group At routine exams   Blood pressure All women in this age group Yearly checkup if your blood pressure is normal  Normal blood pressure is less than 120/80 mm Hg  If your blood pressure reading is higher than normal, follow the advice of your healthcare provider   Breast cancer All women at average risk in this age group Yearly mammogram should be done until age 60. At age 46, you can switch to every other year or  choose to continue yearly.  All women should know the possible benefits and risks of breast cancer screening with mammograms.   Cervical cancer All women in this age group, except women who have had a complete hysterectomy Pap test every 3 years or Pap test with human papillomavirus (HPV) test every 5 years   Chlamydia Women at increased risk for infection At routine exams   Colorectal cancer All women at average risk in this age group Multiple tests are available and are used at different times. Possible tests include:   Flexible sigmoidoscopy every 5 years, or   Colonoscopy every 10 years, or   CT colonography (virtual colonoscopy) every 5 years, or   Yearly fecal occult blood test, or   Yearly fecal immunochemical test every year, or   Stool DNA test, every 3 years  If you choose a test other than a colonoscopy and have an abnormal test result, you will need to follow up with a colonoscopy. Screening advice varies among expert groups. Talk with your healthcare provider about which tests are best for you.  Some people should be screened using a different schedule because of their personal or family health history. Talk with your healthcare provider about your health history.   Depression All women in this age group At routine exams   Gonorrhea Sexually active women at increased  risk for infection At routine exams   Hepatitis C Anyone at increased risk; 1 time for those born between 1945 and 1965 At routine exams   High cholesterol or triglycerides All women in this age group who are at risk for coronary artery disease At least every 5 years   HIV All women At routine exams   Lung cancer Adults age 30 to 10 who have smoked Yearly screening in smokers with 30 pack-year history of smoking or who quit within 15 years   Obesity All women in this age group At routine exams   Osteoporosis Women who are postmenopausal Ask your healthcare provider   Syphilis Women at increased risk for infection - talk with your  healthcare provider At routine exams   Tuberculosis Women at increased risk for infection - talk with your healthcare provider Ask your healthcare provider   Vision All women in this age group Ask your healthcare provider   Vaccine Who needs it How often   Chickenpox (varicella) All women in this age group who have no record of this infection or vaccine 2 doses; the second dose should be given at least 4 weeks after the first dose   Hepatitis A Women at increased risk for infection - talk with your healthcare provider 2 doses given at least 6 months apart   Hepatitis B Women at increased risk for infection - talk with your healthcare provider 3 doses over 6 months; second dose should be given 1 month after the first dose; the third dose should be given at least 2 months after the second dose and at least 4 months after the first dose   Haemophilus influenzae Type B (HIB) Women at increased risk for infection - talk with your healthcare provider 1 to 3 doses   Influenza (flu) All women in this age group Once a year   Measles, mumps, rubella (MMR) Women in this age group through their late 41s who have no record of these infections or vaccines 1 dose   Meningococcal Women at increased risk for infection - talk with your healthcare provider 1 or more doses   Pneumococcal conjugate vaccine (PCV13) and pneumococcal polysaccharide vaccine (PPSV23) Women at increased risk for infection - talk with your healthcare provider PCV13: 1 dose ages 13 to 72 (protects against 13 types of pneumococcal bacteria)  PPSV23: 1 to 2 doses through age 44, or 1 dose at 71 or older (protects against 23 types of pneumococcal bacteria)   Tetanus/diphtheria/pertussis (Td/Tdap) booster All women in this age group Td every 10 years, or a 1-time dose of Tdap instead of a Td booster after age 24, then Td every 16 years   Zoster All women ages 74 and older 1 dose   Counseling Who needs it How often   BRCA gene mutation testing for breast and ovarian  cancer susceptibility Women with increased risk for having gene mutation When your risk is known   Breast cancer and chemoprevention Women at high risk for breast cancer When your risk is known   Diet and exercise Women who are overweight or obese When diagnosed, and then at routine exams   Sexually transmitted infection prevention Women at increased risk for infection - talk with your healthcare provider At routine exams   Use of daily aspirin Women ages 24 and up in this age group who are at risk for cardiovascular health problems such as stroke When your risk is known   Use of tobacco and the health effects  it can cause All women in this age group Every exam   [place]  Date Last Reviewed: 01/02/2015   2000-2019 [employer]. [address] [place] [address]. All rights reserved. This information is not intended as a substitute for professional medical care. Always follow your healthcare professional's instructions.        Taking [place] is given tohelp treat or prevent illness. But if you don't take it correctly, it might not help. It might even harm you. Your healthcare provider or pharmacist can help you learn the right way to take your medicine. Listed below are some tips to help you take medicine safely.  Safety tips  Do's and don'ts include the following:   Have a routine for taking each medicine. Make it part of something you do each day, such as brushing your teeth or eating a meal.   When you go to the hospital or your healthcare provider's office, bring all your current medicines in their original boxes or bottles. If you can't do that,bring an up-to-date list of your medicines.   Don't stop taking a prescription medicine unless your healthcare provider tells you to. Doing so could make your condition worse.   Don't share medicines.   Let your healthcare provider and pharmacist know of any allergies you have and if there have been any  changes in your health since you were last prescribed these medicines.   Taking prescription medicines with alcohol, street drugs, herbs, supplements, or even some over-the-counter medicines can be harmful. Talk to your healthcare provider or pharmacist before using any of these things while taking a prescription medicine.   When filling your prescriptions, try using the same pharmacy for all your medicines. If not, let the pharmacist know what medicines you are already on.   Consider putting a week's worth of medicines in a container marked for each day of the week.   Keep medicines out of the reach of children and pets. Be sure all medicine bottles have safety caps.   Keep narcotics and sedatives out of sight or in a locked box or safe.   Keep your medicines in a dry and cool location (not in the bathroom.)   Don't use medicine that has expired or that doesn't look or smell right. Get rid of it properly. To find out the right way to get rid of medicine:   Call your city or county government's household trash and recycling service and ask if a medicine take-back program is available in your community.   Call your local pharmacy and ask the right way to get rid of the medicine.   Go to https://carter-cox.org/ to learn how to get rid of medicines safely.   Medicine that comes in a container for a single dose should be used only 1 time. If you use the container a second time, it may have germs in it that can cause illness. These illnesses include hepatitis B and C. They also include infections of the brain or spinal cord (meningitis and epidural abscess).  Using generic medicines  Medicines have brand names and generic (chemical) names. When a medicine is first made, it is sold only under its brand name. Later, it can be made and sold as a generic. Generic medicines cost less than brand-name medicines and most work just as well. Most people can use the generic medicine instead  of the brand-name medicine, unless their healthcare provider says otherwise.  Date Last Reviewed: 07/09/2015   2000-2017 [employer], [place] [address]. All rights reserved. This information is not intended as a substitute for professional medical care. Always follow your healthcare professional's instructions.        Preventing Falls in the Home  An adult or child can fall for many reasons. If you are an older adult, you may fall because your reaction time slows down. Your muscles and joints may get stiff, weak, or less flexible because of illness, medicines, or a physical condition. These things can also make a child more likely to fall or be injured in a fall.  Other health problems that make falls more likely include:   Arthritis   Dizziness or lightheadedness when you get out of bed (orthostatic hypotension)   History of a stroke   Dizziness   Anemia   Certain medicines taken for mental illness or to control blood pressure.   Problems with balance or gait   Bladder or urinary problems   History of falls with or without an injury   Changes in vision (vision impairment)   Changes in thinking skills and memory (cognitive impairment)  Injuries from a fall can include broken bones, dislocated joints, internal bleeding and cuts. When these injuries are serious enough, they can make it impossible for you or a child who is injured in a fall to live on his or her ownhome.  Prevention tips  To help prevent falls and fall-related injuries, follow the tips below.  Floors  Make floors safer by doing the following:   Put nonskid pads under area rugs.   Remove throw rugs.   Replace worn floor coverings.   Tack carpets firmly to each step on carpeted stairs. Put nonskid strips on the edges of uncarpeted stairs.   Keep floors and stairs free of clutter and cords.   Arrange furniture so there are clear pathways.   Clean up any spills right away.   Wear shoes that  fit.  Bathrooms    Make bathrooms safer by doing the following:   Install grab bars in the tub or shower.   Apply nonskid strips or put a nonskid rubber mat in the tub or shower.   Sit on a bath chair to bathe.   Use bathmats with nonskid backing.  Lighting and the environment  Improve lighting in your home by doing the following:   Keep a flashlight in each room. Or put a lamp next to the bed within easy reach.   Put nightlights in the bedrooms, hallways, kitchen, and bathrooms.   Make sure all stairways have good lighting.   Take your time when going up and down stairs.   Put handrails on both sides of stairs and in walkways for more support. To prevent injury to your wrist or arm, don't use handrails to pull yourself up.   Install grab bars to pull yourself up.   Move or rearrange items that you use often. This will make them easier to find or reach.   Look at your home to find any safety hazards. Especially look at doorways, walkways, and the driveway. Remove or repair any safety problems that you find.  Date Last Reviewed: 07/09/2015   2000-2019 [employer]. [address] [place] [address]. All rights reserved. This information is not intended as a substitute for professional medical care. Always follow your healthcare professional's instructions.        MyPlate Worksheet:  1,800 Calories  Your calorie needs are about 1,800 calories a day. Below are the U.S. Department of Agriculture Scientist, research (physical sciences)) guidelines for your daily recommended amount of each food group.  Vegetables  2 cups Fruits  1 cups Grains  6 ounces Dairy  3 cups Protein  5 ounces   Eat a variety of vegetables each day.  Aim for these amounts each week:   1 cups dark green vegetables   5 cups red or orange-colored vegetables   1 cups dry beans and peas   5 cups starchy vegetables   4 cups other vegetables Eat a variety of fruits each day.  Go easy on fruit juices.  Good choices of fruits  include:   Berries   Bananas   Apples   Melon   Dry fruit   Frozen fruit   Canned fruit Choose whole grains whenever you can.  Aim to eat at least 3 ounces of whole grains each day:   Bread   Cereal   Rice   Pasta   Potatoes   Tortillas Choose low-fat or fat-free milk, yogurt, or cheese each day.  Good choices include:   Low-fat or fat-free milk or chocolate milk   Low-fat or fat-free yogurt   Low-fat or fat-free cottage cheese or other reduced-fat cheeses   Calcium-fortified milk alternatives Choose low-fat or lean meats, poultry, fish and seafood each day.  Vary your protein. Choose more:   Fish and other seafood   Lean low-fat meat and poultry   Eggs   Beans, peas   Tofu   Unsalted nuts and seeds  Choose less high-fat and red meat.   Source: [employer], www.choosemyplate.gov  Know your limits on oils (fats) and sugars   Your allowance for oils is 24 grams or about 5 teaspoons a day (oil includes vegetable oil, mayonnaise, soft margarine, salad dressing, nuts, olives, avocados, and some fish).   Limit the extras (solid fats and sugars, also called "empty calories") to 130 calories a day.   Cut back on salt (sodium). Stay under 2,300 mg sodium a day. If you have a health condition such as heart disease or high blood pressure, your doctor will likely tell you to limit sodium to no more than 1,500 mg a day.  Get moving and be active!  Aim for at least 30 minutes of physical activity most days of the week or 150 minutes of exercise a week.  MyPlate servings worksheet: 1,800 calories  This worksheet tells you how many servings you should get each day from each food group, and tells you how much food makes a serving. Use this as a guide as you plan your meals throughout the day. Track your progress daily by writing in what you actually ate.  Food group Daily MyPlate goal What you ate today   Vegetables 5 half-cups or 5 servings  One serving is:   cup cut-up raw or cooked vegetables  1 cup  raw, leafy vegetables   baked sweet potato   cup vegetable juice  Note: At meals, fill half your plate with vegetables and fruit.    Fruits 3 half-cups or 3 servings  One serving is:   cup fresh, frozen, or canned fruit  1 medium piece of fruit  1 cup of berries or melon   cup dried fruit   cup 100% fruit juice  Note: Make most choices fruit instead of juice.    Grains 6 servings or 6 ounces  One serving is:  1 slice bread  1 cup dry cereal   cup cooked rice, pasta, or cereal  1 5-inch tortilla  Note: Choose whole grains for at least half of your servings each day.    Dairy 3 servings or 3 cups  One serving is:  1 cup milk  1 ounces reduced-fat hard cheese  2 ounces processed cheese  1 cup low-fat yogurt  1/3 cup shredded cheese  Note: Choose low-fat or fat-free most often.    Protein 5 servings or 5 ounces  One serving is:  1 ounce cooked lean beef, pork, lamb, or ham  1 ounce cooked chicken or [place] (no skin)  1 ounce cooked fish or shellfish (not fried)  1 egg   cup egg substitute   ounce nuts or seeds  1 tablespoon peanut or almond butter   cup cooked dry beans or peas   cup tofu  2 tablespoons hummus    Date Last Reviewed: 06/07/2016   2000-2019 [employer]. [address] [place] [address]. All rights reserved. This information is not intended as a substitute for professional medical care. Always follow your healthcare professional's instructions.        Low-Salt Choices  Eating salt(sodium)can make your body retain too much water. Excess water makes your heart work harder. Canned, packaged, and frozen foods are easy to prepare. But they are often high in sodium. Here are some ideas for low-salt foods you can easily make yourself.    For breakfast   Fruit or 100% fruit juice   Whole-wheat bread or an English muffin. Look for sodium content on Nutrition Facts labels.   Low-fat milk or yogurt   Unsalted eggs   Shredded wheat   Corn tortillas   Unsalted steamed  rice   Regular (not instant) hot cereal, made without salt  Stay away from:   Sausage, bacon, and ham   Flour tortillas   Packaged muffins, pancakes, and biscuits   Instant hot cereals   Cottage cheese    For lunch and dinner   Fresh fish, chicken, [place], or meat-baked, broiled, or roasted without salt   Dry beans, cooked without salt   Tofu, stir-fried without salt   Unsalted fresh fruit and vegetables, or frozen or canned fruit and vegetables with no added salt  Stay away from:   Lunch or deli meat that is cured or smoked   Cheese   Tomato juice and ketchup   Canned vegetables, soups, and fish not labeled as no-salt-added or reduced sodium   Packaged gravies and sauces   Olives, pickles, and relish   Bottled salad dressings    For snacks and desserts   Yogurt   Unsalted, air-popped popcorn   Unsalted nuts or seeds  Stay away from:   Pies and cakes   Packaged dessert mixes   Pizza   Canned and packaged puddings   Pretzels, chips, crackers, and nuts-unless the label says unsalted    Date Last Reviewed: 05/08/2016   2000-2019 [employer]. [address], [place] [address]. All rights reserved. This information is not intended as a substitute for professional medical care. Always follow your healthcare professional's instructions.        The Benefits of Living Smoke Free  What do you want to gain from quitting? Check off some reasons to quit.  Health benefits  ___ Improve my ability to breathe without coughing or shortness of breath  ___ Reduce my risk of lung  cancer, heart disease, chronic lung disease  ___ Have fewer wrinkles and softer skin  ___ Improve my sense of taste and smell  ___ For pregnant women-reduce the risk of having a miscarriage, stillbirth, premature birth, or low-birth-weight baby  Personal benefits  ___ Feel more in control of my life  ___ Have better-smelling hair, breath, clothes, home, and car  ___ Save time by not having to take smoke  breaks, buy cigarettes, or hunt for a light  ___ Have whiter teeth  Family benefits  ___ Reduce my children's respiratory tract infections  ___ Set a good example for my children  ___ Reduce my family's cancer risk  Financial benefits  ___ Save hundreds of dollars each year that would be spent on cigarettes  ___ [employer] on medical bills  ___ [employer] on life, health, and car insurance premiums    Those dollars add up!  Cigarettes are expensive, and getting more expensive all the time. Do you realize how much money you are spending on cigarettes per year? What is the average amount you spend on a pack of cigarettes? What is the average number of packs that you smoke per day? Using your answers to these questions, fill in this formula to help you find out:  ($ _____ per pack)  ( _____ number of packs per day)(365 days) =  $ _____ yearly cost of smoking  Besides tobacco, there are other costs, including extra cleaning bills and replacement costs for clothing and furniture; medical expenses for smoking-related illnesses; and higher health, life, and car insurance premiums.  Cigars and pipes count too!  Cigars and pipes are also dangerous. So are smokeless (chewing) tobacco and snuff. All of these products contain nicotine, a highly addictive substance that has harmful effects on your body. Quitting smoking means giving up all tobacco products.     For more information   [employer]ax[employer]   [place] Smoking Quitline: 877-44U-QUIT 254-580-8053)   Date Last Reviewed: 01/09/2016   2000-2019 [employer] [facility]. [address], [place] [address]. All rights reserved. This information is not intended as a substitute for professional medical care. Always follow your healthcare professional's instructions.        Your Diabetes Foot Care Program    Every day you depend on your feet to keep you moving. But when you have diabetes, your feet need special care. Even  a small foot problem can become very serious. So don't take your feet for granted. Work with your diabetes healthcare team. They can help you protect your feet and keep them healthy.  Assessing your feet  An assessment helps your healthcare provider check the condition of your feet. The assessment includes a review of your diabetes history and overall health. It may also include a foot exam, X-rays, or other tests. These can help show problems beneath the skin that you can't see or feel.  Health history  You will be asked about your overall health and any history of foot problems. You'll also discuss your diabetes history, such as if your blood sugar level has changed over time. It also includes questions about feelings of pain, tingling, pins and needles, or numbness. Your healthcare provider will also want to know if you have high blood pressure and heart disease. Or if you smoke.Tell your provider about any past foot infections, medicines (including over-the-counter), vitamins, supplements, or herbs you take.  Foot exam  A foot exam checks the condition of different parts of your  foot. First your skin and nails are checked for any signs of infection. Blood flow is checked by feeling for the pulses in each foot. You may also have tests to study the nerves in the foot. These include using a small wire (filament) to see how sensitive your feet are. Dry skin on your feet may be a sign of damage to nerves which control the moisture on your skin. Toe nail fungal infections may lead to more serious bacterial infections. In certain cases, you will be asked to walk a short distance. This is done to check for bone, joint, and muscle problems.  Diagnostic tests  If needed, your healthcare provider will suggest certain tests to learn more about your feet. These include:   Doppler tests. These measure blood flow in the feet and lower leg.   X-rays. These can show bone or joint problems.   Other imaging tests. These may  include an MRI, bone scan, and CT scan. These can help show bone infections.   Other tests. These may include vascular tests. These tests study the blood flow in your feet and legs. This is done by comparing the blood pressures in your arm and ankle. You may also have nerve studies to learn how sensitive your feet are.  Creating a foot care program  Based on the evaluation, your healthcare provider will create a foot care program for you. This may be as simple as starting a daily self-care routine. And changing the types of shoes you wear. It may also include treating minor foot problems such as a corn or blister. In some cases, surgery will be needed to treat an infection. Or to treat mechanical problems, such as claw toes and hammer toes.  Preventing problems  When you have diabetes, it's easier to prevent problems than to treat them later on. So see your healthcare team for regular checkups and foot care. Your healthcare team can also help you learn more about caring for your feet at home. For example, you may be told to not walk barefoot, even in your home. Or you may be told you need special footwear to protect your feet.  Have regular checkups  Foot problems can happen quickly. So follow your healthcare team's schedule for regular checkups. During office visits, take off your shoes and socks as soon as you get in the exam room. Ask your healthcare provider to check your feet for problems. This will make it easier to find and treat small skin issues before they get worse. Regular checkups can also help keep track of the blood flow and feeling in your feet. You may have lack of feeling in your feet (neuropathy). Then you will need checkups more often.  Learn about self-care  The more you know about diabetes and your feet, the easier it will be to prevent problems. Your healthcare team can teach you how to check your feet. And teach you to look for warning signs. They can also give you other foot care tips.  Before your office visits, write down any foot care questions you have. During office visits, ask any questions you have.  Date Last Reviewed: 06/08/2015   2000-2019 [employer]. [address] [place] [address]. All rights reserved. This information is not intended as a substitute for professional medical care. Always follow your healthcare professional's instructions.        Treating Constipation    Constipation is a common and often uncomfortable problem. Constipation means you have  bowel movementsfewer than 3 times per week,or strain to pass hard, dry stool. It can last a short time. Or it can be a problem that never seems to go away. The good news is that it can often be treated and controlled.  Eat more fiber  One of the best ways to help treat constipation is to increase your fiber intake. You can do this either through diet or by using fiber supplements. Fiber (in whole grains, fruits, and vegetables) adds bulk and absorbs water to soften the stool. This helps the stool pass through the colon more easily. When you increase your fiber intake, do it slowly to avoid side effects such as bloating. Also increase the amount of water that you drink. Eating more of the following foods can add fiber to your diet.   High-fiber cereals   Whole grains, bran, and brown rice   Vegetables such as carrots, broccoli, and greens   Fresh fruits (especially apples, pears, and dried fruits like raisins and apricots)   Nuts and legumes (especially beans such as lentils, kidney beans, and lima beans)  Get physically active  Exercise helps improve the working of your colon which helps ease constipation. Try to get some physicalactivity every day. If you haven't been active for a while, talk to yourhealthcare providerbefore starting again.  Laxatives  Your healthcare provider may suggest an over-the-counter product to help ease your constipation. He or she may suggest the use of bulk-forming agents or  laxatives. The use of laxatives, if used as directed, is common and safe. Follow directions carefully when using them. See your healthcare provider for new-onset constipation, or long-term constipation,to rule out other causes such as medicines or thyroid disease.  Date Last Reviewed: 06/08/2015   2000-2019 [employer] [facility]. [address], [place] [address]. All rights reserved. This information is not intended as a substitute for professional medical care. Always follow your healthcare professional's instructions.        Understanding Anxiety Disorders  Almost everyone gets nervous now and then. It's normal to have knots in your stomach before a test, or for your heart to race on a first date. But an anxiety disorder is much more than a case of nerves. In fact, its symptoms may be overwhelming. But treatment can relieve many of these symptoms. Talking to your healthcare provider is the first step.    What are anxiety disorders?  An anxiety disorder causes intense feelings of panic and fear. These feelings may arise for no apparent reason. And they tend to recur again and again. They may prevent you from coping with life and cause you great distress. As a result, you may avoid anything that triggers your fear. In extreme cases, you may never leave the house. Anxiety disorders may cause other symptoms, such as:   Obsessive thoughts that are unwanted and you can't control   Constant nightmares or painful thoughts of the past   Nausea, sweating, and muscle tension   Trouble sleeping or concentrating  What causes anxiety disorders?  Anxiety disorders tend to run in families. For some people, childhood abuse or neglect may play a role. For others, stressful life events or trauma may trigger anxiety disorders. Anxiety can trigger low self-esteem and poor coping skills.   Panic disorder. This causes an intense fear of being in danger.   Phobias. These are extreme fears of certain objects, places, or  events.   Obsessive-compulsive disorder. This causes you to have unwanted thoughts and  urges. You also may perform certain actions over and over.   Posttraumatic stress disorder. This occurs in people who have survived a terrible ordeal. It can cause nightmares and flashbacks about the event.   Generalized anxiety disorder. This causes constant worry that can greatly disrupt your life.    Getting better  You may believe that nothing can help you. Or, you might fear what others may think. But most anxiety symptoms can be eased. Having an anxiety disorder is nothing to be ashamed of. Most people do best with treatment that combines medicine and individual and group therapy. These aren't cures. But they can help you live a healthier life.  Date Last Reviewed: 01/09/2016   2000-2019 [employer] [facility]. [address] [place] [address]. All rights reserved. This information is not intended as a substitute for professional medical care. Always follow your healthcare professional's instructions.        Your Body's Response to Anxiety    Normal anxiety is part of the body's natural defense system. It's an alert to a threat that is unknown, vague, or comes from your own internal fears.While you're in this state, your feelings can range from a vague sense of worry to physical sensations such as a pounding heartbeat. These feelings make you want to react to the threat. An anxiety response is normal in many situations. But when you have an anxiety disorder, the same response can occur at the wrong times.  Anxiety can be helpful  Normal anxiety is a signal from your brain that warns you of a threat and is a normal response to help you prevent something or decrease the bad effects of something you can't control. For example, anxiety is a normal response to situations that might damage your body, separate you from a loved one, or lose your job. The symptoms of anxiety can be physical and mental.  How does it  feel?  At certain times, people with anxiety may have:   Dizziness   Muscle tension or pain   Restlessness   Sleeplessness   Trouble concentrating   Racing heartbeat   Fast breathing   Shaking or trembling   Stomachache   Diarrhea   Loss of energy   Sweating   Cold, clammy hands   Chest pain   Dry mouth  Anxiety can also be a problem  Anxiety can become a problem when it is hard to control, occurs for months, and interferes with important parts of your life. With an anxiety disorder, your body has the response described above, but in inappropriate ways. The response a person has depends on the anxiety disorder he or she has. With some disorders, the anxiety is way out of proportion to the threat that triggers it. With others, anxiety may occur even when there isn't a clear threat or trigger.  Who does it affect?  Some people are more prone to persistent anxiety than others. It tends to run in families, and it affects more younger people than older people, and more women than men. But no age, race, or gender is immune to anxiety problems.  Anxiety can be treated  The good news is that the anxiety that's disrupting your life can be treated. Check with your healthcare provider and rule out any physical problems that may be causing the anxiety symptoms. If an anxiety disorder is diagnosed seek mental healthcare. This is an illness and it can respond to treatment. Most types of anxiety disorders will respond to "  talk therapy" and medicines. Working with your doctor or other healthcare provider, you can develop skills to help you cope with anxiety. You can also gain the perspective you need to overcome your fears. Note:Good sources of support or guidance can be found at your local hospital, mental health clinic, or an employee assistance program.  How to cope with anxiety  If anxiety is wearing you down, here are some things you can do to cope:   Keep in mind that you can't control everything about a  situation. Change what you can and let the rest take its course.   Exercise-it's a great way to relieve tension and help your body feel relaxed.   Avoid caffeine and nicotine, which can make anxiety symptoms worse.   Fight the temptation to turn to alcohol or unprescribed drugs for relief. They only make things worse in the long run.   Educate yourself about anxiety disorders. Keep track of helpful online resources and books you can use during stressful periods.   Try stress management techniques such as meditation.   Consider online or in-person support groups.   Date Last Reviewed: 12/09/2015   2000-2019 [employer] [facility]. [address], [place] [address]. All rights reserved. This information is not intended as a substitute for professional medical care. Always follow your healthcare professional's instructions.        Back Care Tips    Caring for your back  These are things you can do to prevent a recurrence of acute back pain and to reduce symptoms from chronic back pain:   Stay at a healthy weight. If you are overweight, losing weight will help most types of back pain.   Exercise is an important part of recovery from most types of back pain. The muscles behind and in front of the spine support the back. This means strengthening both the back muscles and the abdominal muscles will provide better support for your spine.   Swimming and brisk walking are good overall exercises to improve your fitness level.   Practice safe lifting methods (see below).   Practice good posture when sitting, standing, and walking. Don't sit for a long time. This puts more stress on the lower back than standing or walking.   Wear quality shoes with good arch support. Foot and ankle alignment can affect back symptoms. Don't wear high heels.   Therapeutic massage can help relax the back muscles without stretching them.   During the first 24 to 72 hours after an acute injury or flare-up of chronic back  pain, put an ice pack on the painful area for 20 minutes and then remove it for 20 minutes. Do thisover a period of 60 to 90 minutes, or several times a day. As a safety precaution, don't use a heating pad at bedtime. Sleeping on a heating pad can lead to skin burns or tissue damage.   You can alternate using ice and heat.  Medicines  Talk with your healthcare provider before using medicines, especially if you have other health problems or are taking other medicines.   You may use over-the-counter medicines, such as acetaminophen, ibuprofen, or naprosyn to control pain, unless your healthcare provider prescribed other pain medicine. Talk with your healthcare provider before taking any medicines if you have a chronic condition such ass diabetes, liver or kidney disease, stomach ulcers, or digestive bleeding, or are taking blood thinners.   Be careful if you are given prescription pain medicines, opioids, or medicine for  muscle spasm. They can cause drowsiness, and affect your coordination, reflexes, and judgment. Don't drive or operate heavy machinery while taking these types of medicines. Take prescription pain medicine only as prescribed by your healthcare provider.  Lumbar stretch  This simple stretch will help relax muscle spasm and keep your back more limber. If exercise makes your back pain worse, don't do it.   Lie on your back with your knees bent and both feet on the ground.   Slowly raise your left knee to your chest as you flatten your lower back against the floor. Hold for 5 seconds.   Relax and repeat the exercise with your right knee.   Do 10 of these exercises for each leg.  Safe lifting method   Don't bend over at the waist to lift an object off the floor. Instead, bend your knees and hips in a squat.   Keep your back and head upright   Hold the object close to your body, directly in front of you.   Straighten your legs to lift the object.   Lower the object to the floor in the reverse  fashion.   If you must slide something across the floor, push it.    Posture tips  Sitting  Sit in chairs with straight backs or low-back support. Keep your knees lower than your hips, with your feet flat on the floor.  When driving, sit up straight. Adjust the seat forward so you are not leaning toward the steering wheel. A small pillow or rolled towel behind your lower back may help if you are driving long distances.  Standing  When standing for long periods, shift most of your weight to one leg at a time. Switch legs every few minutes.  Sleeping  The best way to sleep is on your side with your knees bent. Put a low pillow under your head to support your neck in a neutral spine position. Don't use thick pillows that bend your neck to one side. Put a pillow between your legs to further relax your lower back. If you sleep on your back, put pillows under your knees to support your legs in a slightly flexed position. Use a firm mattress. If your mattress sags, replace it, or use a 1/2-inch plywood board under the mattress to add support.  Follow-up care  Follow up with yourhealthcare provider, or as advised.  If X-rays, a CT scan or an MRI scan were taken, they may be reviewed by a radiologist. You will be told of any new findings that may affect your care.  Call 911  Call 911 if any of the following occur:   Trouble breathing   Confusion   Very drowsy   Fainting or loss of consciousness   Rapid or very slow heart rate   Loss of bowel or bladder control  When to seek medical advice  Call your healthcare provider right awayif any of the following occur:   Pain becomes worse or spreads to your arms or legs   Weakness or numbness in one or both arms or legs   Numbness in the groin area  Date Last Reviewed: 05/09/2015   2000-2019 [employer] [facility]. [address] [place] [address]. All rights reserved. This information is not intended as a substitute for professional medical care. Always  follow your healthcare professional's instructions.        Checking for Skin Cancer  You can find cancer early by checking your skin each month.  There are 3 kinds of skin cancer. They are melanoma, basal cell carcinoma, and squamous cell carcinoma. Doing monthly skin checks is the best way to find new marks or skin changes. Follow the instructions below for checking your skin.  The ABCDEs of checking moles for melanoma  Check your moles or growths for signs of melanoma using ABCDE:   Asymmetry: the sides of the mole or growth don't match   Border: the edges are ragged, notched, or blurred   Color: the color within the mole or growth varies   Diameter: the mole or growth is larger than 6 mm (size of a pencil eraser)   Evolving: the size, shape, or color of the mole or growth is changing (evolving is not shown in the images below)    Checking for other types of skin cancer  Basal cell carcinoma or squamous cell carcinoma have symptoms such as:     A spot or mole that looks different from all other marks on your skin   Changes in how an area feels, such as itching, tenderness, or pain   Changes in the skin's surface, such as oozing, bleeding, or scaliness   A sore that does not heal   New swelling or redness beyond the border of a mole    Who's at risk?  Anyone can getskin cancer. But you are at greater risk if you have:   Fair skin, light-colored hair, or light-colored eyes   Many moles or abnormal moles on your skin   A history of sunburns from sunlight or tanning beds   A family history of skin cancer   A history of exposure to radiation or chemicals   A weakened immune system  If you have had skin cancer in the past, you are at riskfor recurring skin cancer.  How to check your skin  Do your monthly skin checkups in front of a full-length mirror. Check all parts of your body, including your:   Head (ears, face, neck, and scalp)   Torso (front, back, and sides)   Arms (tops, undersides, upper,  and lower armpits)   Hands (palms, backs, and fingers, including under the nails)   Buttocks and genitals   Legs (front, back, and sides)   Feet (tops, soles, toes, including under the nails, and between toes)  If you have a lot of moles, take digital photos of them each month. Make sure to take photos both up close and from a distance. These can help you see if any moles change over time.  Most skin changes are not cancer. But if you see any changes in your skin, call your doctor right away. Only he or she can diagnose a problem. If you have skin cancer, seeing your doctor can be the first step toward getting the treatment that could save your life.  Date Last Reviewed: 03/08/2018   2000-2019 [employer] [facility]. [address], [place] [address]. All rights reserved. This information is not intended as a substitute for professional medical care. Always follow your healthcare professional's instructions.

## 2018-09-16 ENCOUNTER — Ambulatory Visit: Payer: Commercial Managed Care - PPO | Attending: Family Medicine

## 2018-09-16 ENCOUNTER — Encounter (HOSPITAL_BASED_OUTPATIENT_CLINIC_OR_DEPARTMENT_OTHER): Payer: Self-pay | Admitting: Family Medicine

## 2018-09-16 ENCOUNTER — Ambulatory Visit (INDEPENDENT_AMBULATORY_CARE_PROVIDER_SITE_OTHER): Payer: Commercial Managed Care - PPO | Admitting: Family Medicine

## 2018-09-16 VITALS — BP 118/70 | HR 78 | Temp 97.4°F | Resp 12 | Ht 66.0 in | Wt 218.6 lb

## 2018-09-16 DIAGNOSIS — Z6835 Body mass index (BMI) 35.0-35.9, adult: Secondary | ICD-10-CM

## 2018-09-16 DIAGNOSIS — Z0000 Encounter for general adult medical examination without abnormal findings: Principal | ICD-10-CM

## 2018-09-16 DIAGNOSIS — I2510 Atherosclerotic heart disease of native coronary artery without angina pectoris: Secondary | ICD-10-CM

## 2018-09-16 DIAGNOSIS — K219 Gastro-esophageal reflux disease without esophagitis: Secondary | ICD-10-CM

## 2018-09-16 DIAGNOSIS — G4489 Other headache syndrome: Principal | ICD-10-CM | POA: Insufficient documentation

## 2018-09-16 DIAGNOSIS — Z23 Encounter for immunization: Secondary | ICD-10-CM

## 2018-09-16 DIAGNOSIS — G8929 Other chronic pain: Secondary | ICD-10-CM

## 2018-09-16 DIAGNOSIS — Z951 Presence of aortocoronary bypass graft: Secondary | ICD-10-CM

## 2018-09-16 DIAGNOSIS — M549 Dorsalgia, unspecified: Secondary | ICD-10-CM

## 2018-09-16 DIAGNOSIS — E669 Obesity, unspecified: Secondary | ICD-10-CM

## 2018-09-16 DIAGNOSIS — E119 Type 2 diabetes mellitus without complications: Secondary | ICD-10-CM

## 2018-09-16 DIAGNOSIS — M5136 Other intervertebral disc degeneration, lumbar region: Secondary | ICD-10-CM

## 2018-09-16 DIAGNOSIS — E041 Nontoxic single thyroid nodule: Secondary | ICD-10-CM

## 2018-09-16 DIAGNOSIS — I6529 Occlusion and stenosis of unspecified carotid artery: Secondary | ICD-10-CM

## 2018-09-16 DIAGNOSIS — E7800 Pure hypercholesterolemia, unspecified: Secondary | ICD-10-CM

## 2018-09-16 DIAGNOSIS — E785 Hyperlipidemia, unspecified: Secondary | ICD-10-CM

## 2018-09-16 DIAGNOSIS — I10 Essential (primary) hypertension: Secondary | ICD-10-CM

## 2018-09-16 DIAGNOSIS — I639 Cerebral infarction, unspecified: Secondary | ICD-10-CM

## 2018-09-16 DIAGNOSIS — K5900 Constipation, unspecified: Secondary | ICD-10-CM

## 2018-09-16 DIAGNOSIS — F419 Anxiety disorder, unspecified: Secondary | ICD-10-CM

## 2018-09-16 DIAGNOSIS — H0260 Xanthelasma of unspecified eye, unspecified eyelid: Secondary | ICD-10-CM

## 2018-09-16 DIAGNOSIS — I219 Acute myocardial infarction, unspecified: Secondary | ICD-10-CM

## 2018-09-16 DIAGNOSIS — Z1239 Encounter for other screening for malignant neoplasm of breast: Secondary | ICD-10-CM

## 2018-09-16 LAB — COMPREHENSIVE METABOLIC PNL, FASTING
ALBUMIN: 4 g/dL (ref 3.6–4.8)
ALKALINE PHOSPHATASE: 153 U/L — ABNORMAL HIGH (ref 38–126)
ALT (SGPT): 40 U/L (ref 3–45)
ANION GAP: 8 mmol/L
AST (SGOT): 25 U/L (ref 7–56)
BILIRUBIN TOTAL: 0.4 mg/dL (ref 0.2–1.3)
BUN/CREA RATIO: 9
BUN: 11 mg/dL (ref 7–18)
CALCIUM: 9.7 mg/dL (ref 8.5–10.3)
CHLORIDE: 102 mmol/L (ref 101–111)
CO2 TOTAL: 29 mmol/L (ref 22–31)
CO2 TOTAL: 29 mmol/L (ref 22–31)
CREATININE: 1.2 mg/dL (ref 0.50–1.20)
ESTIMATED GFR: 47 mL/min/1.73mˆ2 — ABNORMAL LOW (ref >=60)
GLUCOSE: 212 mg/dL — ABNORMAL HIGH (ref 68–99)
POTASSIUM: 4.8 mmol/L (ref 3.6–5.0)
PROTEIN TOTAL: 6.6 g/dL (ref 6.2–8.0)
SODIUM: 139 mmol/L (ref 137–145)

## 2018-09-20 ENCOUNTER — Telehealth (HOSPITAL_BASED_OUTPATIENT_CLINIC_OR_DEPARTMENT_OTHER): Payer: Self-pay | Admitting: Family Medicine

## 2018-09-20 ENCOUNTER — Other Ambulatory Visit (HOSPITAL_BASED_OUTPATIENT_CLINIC_OR_DEPARTMENT_OTHER): Payer: Self-pay

## 2018-09-20 NOTE — Telephone Encounter (Addendum)
Spoke to pt she will come in a day or so before next visit to get labs drawn.      ----- Message from Cherly Beach, MD sent at 09/19/2018 10:21 PM EDT -----  Please inform the pt her CMP showed worsening renal function and her gall system enzymes were slightly elevated.   We will repeat this test at our next appointment.   [place]

## 2018-09-29 ENCOUNTER — Ambulatory Visit: Payer: Commercial Managed Care - PPO | Attending: Family Medicine

## 2018-09-29 DIAGNOSIS — E041 Nontoxic single thyroid nodule: Principal | ICD-10-CM | POA: Insufficient documentation

## 2018-09-29 DIAGNOSIS — E119 Type 2 diabetes mellitus without complications: Secondary | ICD-10-CM | POA: Insufficient documentation

## 2018-09-29 LAB — THYROID STIMULATING HORMONE WITH FREE T4 REFLEX: TSH: 2.62 u[IU]/mL (ref 0.465–4.680)

## 2018-09-29 LAB — MICROALBUMIN URINE, RANDOM
MICROALBUMIN RANDOM URINE: <0.6 mg/dL
MICROALBUMIN RANDOM URINE: <0.6 mg/dL

## 2018-09-30 LAB — HGA1C (HEMOGLOBIN A1C WITH EST AVG GLUCOSE): HEMOGLOBIN A1C: 10.4 % — ABNORMAL HIGH (ref <5.7)

## 2018-10-05 ENCOUNTER — Ambulatory Visit (INDEPENDENT_AMBULATORY_CARE_PROVIDER_SITE_OTHER): Payer: Commercial Managed Care - PPO | Admitting: PHYSICIAN ASSISTANT

## 2018-10-05 ENCOUNTER — Encounter (HOSPITAL_BASED_OUTPATIENT_CLINIC_OR_DEPARTMENT_OTHER): Payer: Self-pay | Admitting: PHYSICIAN ASSISTANT

## 2018-10-05 VITALS — BP 106/73 | HR 80 | Temp 97.0°F | Ht 66.0 in | Wt 216.8 lb

## 2018-10-05 DIAGNOSIS — E118 Type 2 diabetes mellitus with unspecified complications: Principal | ICD-10-CM

## 2018-10-05 DIAGNOSIS — E538 Deficiency of other specified B group vitamins: Secondary | ICD-10-CM

## 2018-10-05 DIAGNOSIS — Z6834 Body mass index (BMI) 34.0-34.9, adult: Secondary | ICD-10-CM

## 2018-10-05 DIAGNOSIS — E669 Obesity, unspecified: Secondary | ICD-10-CM

## 2018-10-05 DIAGNOSIS — E119 Type 2 diabetes mellitus without complications: Secondary | ICD-10-CM

## 2018-10-05 DIAGNOSIS — E559 Vitamin D deficiency, unspecified: Secondary | ICD-10-CM

## 2018-10-05 LAB — POCT HGB A1C
POCT HGB A1C: 10.7 % — AB (ref 4–6)
POCT HGB A1C: 10.7 % — AB (ref 4–6)

## 2018-10-05 MED ORDER — CYANOCOBALAMIN (VIT B-12) 1,000 MCG/ML INJECTION SOLUTION
1000.00 ug | INTRAMUSCULAR | 0 refills | Status: DC
Start: 2018-10-05 — End: 2019-01-28

## 2018-10-05 MED ORDER — BYDUREON 2 MG SUBCUTANEOUS EXTENDED RELEASE SUSPENSION
2.00 mg | SUBCUTANEOUS | 3 refills | Status: DC
Start: 2018-10-05 — End: 2019-03-01

## 2018-10-05 MED ORDER — EMPAGLIFLOZIN 10 MG-LINAGLIPTIN 5 MG TABLET
1.0000 | ORAL_TABLET | Freq: Every day | ORAL | 1 refills | Status: DC
Start: 2018-10-05 — End: 2018-12-14

## 2018-10-05 MED ORDER — PHENTERMINE 37.5 MG TABLET
37.5000 mg | ORAL_TABLET | Freq: Every morning | ORAL | 0 refills | Status: DC
Start: 2018-10-05 — End: 2018-11-10

## 2018-10-05 MED ORDER — ERGOCALCIFEROL (VITAMIN D2) 1,250 MCG (50,000 UNIT) CAPSULE
50000.00 [IU] | ORAL_CAPSULE | ORAL | 1 refills | Status: DC
Start: 2018-10-05 — End: 2019-03-01

## 2018-10-05 NOTE — Nursing Note (Signed)
Diabetes: Last HbA1C was   Lab Results   Component Value Date    HA1C 10.7 10/05/2018   whiles symptoms do not include polydipsia, polyuria, paresthesias of the feet: not present, hypoglycemic unawareness, chest pain, dyspnea on exertion, diarrhea, foot ulcerations, visual disturbances. Patient checks blood sugars Checks 2 per  day., and they are up and down. Current medications includemetformin. Patient is compliant with medications and does need medication refills.     Microalbumin performed in the past year? Yes  Foot exam performed in the past year? Yes  Has seen an ophthalmologist (eye doctor)? Yes

## 2018-10-05 NOTE — Nursing Note (Signed)
10/05/18 0800   A1C   Time Performed 0831   A1C 10.7   Initials SJJ

## 2018-10-05 NOTE — Progress Notes (Signed)
Patient Name: Monique [name]  Date: 10/05/2018  [facility]  [place]  [place] [address]  [phone]  MRN: [id number]  DOB: Jul 04, 1966    Chief complaint: Follow Up (High A1C).    HPI:  The patient is a 52 y.o. old female who came in today for:    Diabetes mellitus, type II: Last HbA1C was   Lab Results   Component Value Date    HA1C 10.7 10/05/2018   whiles symptoms do not include polydipsia, polyuria, paresthesias of the feet: not present, hypoglycemic unawareness, chest pain, dyspnea on exertion, diarrhea, foot ulcerations, visual disturbances. Patient checks blood sugars Checks 2 per day, and they are up and down. Reports fasting sugar around 250 this morning. Current medications include metformin and Bydureon. Patient is compliant with medications and does need medication refills.     Microalbumin performed in the past year? Yes  Foot exam performed in the past year? Yes  Has seen an ophthalmologist (eye doctor)? Yes    Obesity: Patient's BMI is 34.9 today. She has history of gastric bypass by Dr. Pearlie Oyster at [facility] and lost several pounds. She was taken off of her diabetic medications at that time. Since then, she admits to not monitoring her diet and had gained 6 lbs at last visit. She is upset by this and has been attempting to make changes within the past week. She states that she would like to use medication to help "jump start" her weight loss again.     ROS:  Constitutional: Denies unintentional weight gain/loss, fever, chills, fatigue, tiredness  Skin: Denies skin problems or rashes, itching  Musculoskeletal: Denies unilateral muscle weakness, atrophy, joint pain/swelling  HEENT: Denies blurred vision, vision loss, nasal congestion, otalgia, hearing loss, epistaxis, sore throat, difficulty swallowing  Cardiovascular: Denies heart valve problems/murmurs, irregular heartbeat, angina/chest pain, swelling in feet, swelling in hands, shortness of  breath lying flat, short of breath climbing stairs, palpitations  Respiratory: Denies shortness of breath, productive cough, hemoptysis, wheezing  Gastrointestinal: Denies abdominal pain, diarrhea, constipation, vomiting blood, black or tarry stools, nausea, GERD  Genitourinary: Denies hematuria, change in urinary stream, urinary frequency, incontinence, dysuria, nocturia, sexual dysfunction  Neurologic: Denies headache, seizures, stroke, dizziness, loss of consciousness, paresthesias, numbness  Psychiatric: Denies anxiety, depression, memory loss, suicidal/homicidal thoughts, insomnia  Endocrine: Denies polydipsia, polyuria, thyroid problems, heat intolerance, cold intolerance    Past medical history:  Past Medical History:   Diagnosis Date   . Anxiety    . Carotid stenosis    . Chronic back pain    . Coronary artery disease    . DDD (degenerative disc disease), lumbar     "entire spine"   . Ear piercing    . GERD (gastroesophageal reflux disease)    . H/O complete eye exam     2 years, Dr. [name], at [facility]   . History of dental examination     dentures upper and lower   . HTN    . Hyperlipidemia    . MI (myocardial infarction) (CMS [place]) 2012   . Neck problem     herniated cervical disc   . Obesity (BMI 30-39.9)    . 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 [place] ordered.    . Stroke (CMS [facility])     2002   . Tattoo     Left breast, Right shoulder   . Type  2 diabetes mellitus (CMS [place])      Past surgical history:  Past Surgical History:   Procedure Laterality Date   . ENDOMETRIAL ABLATION  1998   . HX ANKLE FRACTURE TX  1990s    Right, Doran Durand procedure   . HX CATARACT REMOVAL  2005    r and l eyes with implants   . HX CESAREAN SECTION  06/20/2000    x3, 11/21/86, 11/10/84   . HX CHOLECYSTECTOMY  1988   . HX CORONARY ARTERY BYPASS GRAFT  03/31/2011    cardiac, 2 vessel, Dr. [name], [facility]   . HX CORONARY STENT PLACEMENT  2012    x4 stents   . HX GASTRIC  SLEEVE  02/2017    [place], [place], Dr. Pearlie Oyster   . HX HEART CATHETERIZATION  2012    massive heart attack and stents - [facility]   . HX THYROID BIOPSY     . HX THYROIDECTOMY  101/01/2018   . HX TONSILLECTOMY      as child     Medications:  Current Outpatient Medications   Medication Sig   . ACCU-CHEK AVIVA PLUS TEST STRP Strip 1 Strip by Subcutaneous route Three times a day as needed   . BYDUREON 2 mg Subcutaneous 2 mg by Subcutaneous route Every 7 days   . clopidogrel (PLAVIX) 75 mg Oral Tablet Take 1 Tab (75 mg total) by mouth Once a day   . cyanocobalamin (VITAMIN B12) 1,000 mcg/mL Injection Solution 1 mL (1,000 mcg total) by Subcutaneous route Every 30 days   . diclofenac sodium (VOLTAREN) 1 % Gel 2 g by Apply Topically route Four times a day - before meals and bedtime (Patient taking differently: 2 g by Apply Topically route Three times a day as needed )   . docusate sodium (COLACE) 100 mg Oral Capsule Take 1 Cap (100 mg total) by mouth Twice daily   . empagliflozin-linagliptin (GLYXAMBI) 10-5 mg Oral Tablet Take 1 Tab by mouth Once a day   . ergocalciferol, vitamin D2, (DRISDOL) 50,000 unit Oral Capsule Take 1 Cap (50,000 Units total) by mouth Every 7 days   . flash glucose scanning reader (FREESTYLE LIBRE 14 DAY READER) Does not apply Misc For daily use for 14 days.   . flash glucose sensor (FREESTYLE LIBRE 14 DAY SENSOR) Does not apply Kit For daily use for 14 days.   . furosemide (LASIX) 40 mg Oral Tablet Take 1 Tab (40 mg total) by mouth Once a day   . HYDROcodone-acetaminophen (NORCO) 10-325 mg Oral Tablet Take 1 Tab by mouth Four times a day    . Ibuprofen (MOTRIN) 800 mg Oral Tablet Take 1 Tab (800 mg total) by mouth Three times a day as needed for Pain   . lisinopril (PRINIVIL) 2.5 mg Oral Tablet take 1 tablet by mouth once daily   . loratadine (CLARITIN) 10 mg Oral Tablet Take 1 Tab (10 mg total) by mouth Once a day (Patient taking differently: Take 10 mg by mouth Once per day as needed )   . metFORMIN  (GLUCOPHAGE) 500 mg Oral Tablet Take 1 Tab (500 mg total) by mouth Twice daily with food   . metoprolol succinate (TOPROL-XL) 25 mg Oral Tablet Sustained Release 24 hr TAKE 1/2 TABLET ONCE DAILY (Patient taking differently: Once a day 1/2 tab daily)   . niacin (NIASPAN) 500 mg Oral Tablet Sustained Release Take 1 Tab (500 mg total) by mouth Once a day   . nitroGLYCERIN (NITROSTAT)  0.4 mg Sublingual Tablet, Sublingual 1 Tab (0.4 mg total) by Sublingual route Every 5 minutes as needed for Chest pain for 3 doses over 15 minutes   . omeprazole (PRILOSEC) 40 mg Oral Capsule, Delayed Release(E.C.) Take 1 Cap (40 mg total) by mouth Once a day   . ondansetron (ZOFRAN ODT) 8 mg Oral Tablet, Rapid Dissolve Take 1 Tab (8 mg total) by mouth Every 8 hours as needed for nausea/vomiting   . Phentermine (ADIPEX-P) 37.5 mg Oral Tablet Take 1 Tab (37.5 mg total) by mouth Every morning before breakfast   . PRENATAL PLUS, CALCIUM CARB, 27 mg iron- 1 mg Oral Tablet Take 1 Tab by mouth Once a day   . raNITIdine (ZANTAC) 150 mg Oral Tablet Take 1 Tab (150 mg total) by mouth Twice daily   . rosuvastatin (CRESTOR) 20 mg Oral Tablet take 1 tablet by mouth once daily (Patient taking differently: Take 20 mg by mouth Once a day )   . VENTOLIN HFA 90 mcg/actuation Inhalation HFA Aerosol Inhaler Take 1-2 Puffs by inhalation Every 6 hours as needed (wheezing, cough)     Allergies:  Allergies   Allergen Reactions   . Tylenol W Codeine [Acetaminophen-Codeine]      pt states that one time she had chest pains with this med, but dr. thought it was because she had not eaten     Family history:  Family Medical History:     Problem Relation (Age of Onset)    Atrial fibrillation Sister    Colon Cancer Father    Diabetes Mother    Heart Disease Sister, Maternal Grandfather    Hypertension (High Blood Pressure) Mother    Lung Cancer Maternal Grandfather    MI <38 years of age Mother        Social history:  Social History     Socioeconomic History   . Marital  status: Married     Spouse name: [name]   . Number of children: 3   . Years of education: completed 11th grade   . Highest education level: Not on file   Occupational History   . Occupation: disabled   Social Needs   . Financial resource strain: Not hard at all   . Food insecurity:     Worry: Never true     Inability: Never true   . Transportation needs:     Medical: No     Non-medical: No   Tobacco Use   . Smoking status: Current Every Day Smoker     Packs/day: 0.50     Years: 20.00     Pack years: 10.00     Types: Cigarettes     Start date: 51   . Smokeless tobacco: Never Used   Substance and Sexual Activity   . Alcohol use: No     Frequency: Never     Binge frequency: Never   . Drug use: No   . Sexual activity: Yes     Partners: Male   Lifestyle   . Physical activity:     Days per week: 0 days     Minutes per session: 0 min   . Stress: To some extent   Relationships   . Social connections:     Talks on phone: More than three times a week     Gets together: More than three times a week     Attends religious service: Never     Active member of club or organization: No  Attends meetings of clubs or organizations: Never     Relationship status: Married   . Intimate partner violence:     Fear of current or ex partner: No     Emotionally abused: No     Physically abused: No     Forced sexual activity: No   Other Topics Concern   . Routine Exercise No   . Ability to Walk 2 Flight of Steps without SOB/CP Yes   Social History Narrative    Living situation: lives at home with husband, [name], and daughter.  1 dog named Duke and 1 cat named Lucky.    Nutrition:  Eats 5 small meals d/t gastric sleeve surgery.     Caffeine use: none, decaf coffee    Exercise: stays busy around house. No formal form of exercise    Seatbelt use: sometimes    Fire extinguishers in the home: yes    Smoke alarms in the home: yes    Carbon monoxide detectors in the home: yes    Nancy Fetter exposure: sunglasses        Ettie would like for husband, Maniyah Moller,  to speak for her in the event she would become incapacitated.    Full code.      Vitals:    10/05/18 0819   BP: 106/73   Pulse: 80   Temp: 36.1 C (97 F)   SpO2: 97%   Weight: 98.3 kg (216 lb 12.8 oz)   Height: 1.676 m (5' 6" )   BMI: 35.07     Physical Examination:  GENERAL:   Pt is a pleasant, obese, well-developed 52 y.o. female who is in NAD. Appears stated age  65:  head normocephalic, symmetrical facies.   NECK: +well-healed transverse scar of the anterior neck from recent thyroidectomy is well approximated and without signs of infection. supple. No masses, lymphadenopathy, JVD on exam. Trachea midline, no thyromegaly   CV: S1, S2. No murmurs, rubs, gallops.     LUNGS: CTAB, No rhonchi, rales, wheezes.   MSK: Spontaneous normal AROM of major joints as observed during exam. No joint effusions/ swelling/ deformities.   No BLE edema, clubbing, or cyanosis.   Gait normal.  SKIN: No significant lesions, rashes, ecchymoses noted.   NEURO: AAOx4, CN grossly intact. Sensation intact b/l. No focal deficits.  PSYCH: Mood, behavior and affect normal w/ Intact judgement & insight     Diabetes Monitors  A1C: 10.7  A1C Date: 10/05/2018          Urine Microalbumin: 0.54   Microalbumin Date: 09/29/2018    Last Lipid Panel  (Last result in the past 2 years)      Cholesterol   HDL   LDL   Direct LDL   Triglycerides      03/29/18 0958 134  Comment:  Total Cholesterol Classification:     <200        Desirable        200-239     Borderline High        > or = 240  High 52  Comment:  HDL Cholesterol Classification:          <40        Low        > or = 60  High   52  Comment:  LDL Cholesterol Classification:         <100     Optimal       100-129  Near or Above Optimal  130-159  Borderline High       160-189  High       >190     Very High   151  Comment:  Triglyceride Classification:            <150    Normal       150-199 Borderline-High       200-499 High       >500    Very High        Retinal Exam Date: Not  Found  Last diabetic foot exam: Not Found    Visit Diagnosis    Type 2 diabetes mellitus with complication, without long-term current use of insulin (CMS [facility])  Chronic, uncontrolled.   HbA1C worsened from 9.6% to 10.7%.   She admits that her diet and exercise regimen have been lacking recently.  Continue metformin 500 mg BID.   Continue Bydureon 2 mg every 7 days.    She states that she was on Glyxambi prior to her gastric bypass and had good success with that. We will restart today.   Encouraged her to check blood sugars 3-4 times daily and make a log for [place] to review at her next appointment.   Discuss renal concerns and need for monitoring. Check labs next encounter.   She declines new referral for diabetic education.     Class 1 obesity with serious comorbidity and body mass index (BMI) of 34.0 to 34.9 in adult, unspecified obesity type  She has started to regain weight since gastric bypass.   Discussed diet and lifestyle modifications.   She would like to try Adipex. She has lost 2 lbs with her own efforts in the past month.   Discussed high failure rate with Adipex for weight loss and urged her to make dietary and lifestyle changes at the same time to promote continued weight loss after Adipex therapy.   Discussed concern for side effects of Adipex, including seizures, MI. She voices understanding.   NarxCheck reviewed prior to prescribing.     B12 deficiency  Continue supplementation.     Vitamin D deficiency  Continue supplementation.     Orders Placed This Encounter   . POCT HGB A1C   . BYDUREON 2 mg Subcutaneous   . ergocalciferol, vitamin D2, (DRISDOL) 50,000 unit Oral Capsule   . cyanocobalamin (VITAMIN B12) 1,000 mcg/mL Injection Solution   . empagliflozin-linagliptin (GLYXAMBI) 10-5 mg Oral Tablet   . Phentermine (ADIPEX-P) 37.5 mg Oral Tablet     Return in about 1 month (around 11/05/2018), or if symptoms worsen or fail to improve.    Chryl Heck, PA-C  10/05/2018, 09:29  Electronically signed by  Chryl Heck, PA-C

## 2018-10-13 ENCOUNTER — Encounter (HOSPITAL_BASED_OUTPATIENT_CLINIC_OR_DEPARTMENT_OTHER): Payer: Self-pay | Admitting: Family Medicine

## 2018-10-18 ENCOUNTER — Other Ambulatory Visit (HOSPITAL_BASED_OUTPATIENT_CLINIC_OR_DEPARTMENT_OTHER): Payer: Self-pay | Admitting: CARDIOVASCULAR DISEASE

## 2018-10-19 ENCOUNTER — Other Ambulatory Visit (HOSPITAL_BASED_OUTPATIENT_CLINIC_OR_DEPARTMENT_OTHER): Payer: Self-pay | Admitting: CARDIOVASCULAR DISEASE

## 2018-10-19 MED ORDER — PRENATAL PLUS (CALCIUM CARBONATE) 27 MG IRON-1 MG TABLET
1.0000 | ORAL_TABLET | Freq: Every day | ORAL | 3 refills | Status: DC
Start: 2018-10-19 — End: 2019-12-06

## 2018-10-19 MED ORDER — CLOPIDOGREL 75 MG TABLET
75.0000 mg | ORAL_TABLET | Freq: Every day | ORAL | 3 refills | Status: DC
Start: 2018-10-19 — End: 2019-03-01

## 2018-10-19 MED ORDER — FUROSEMIDE 40 MG TABLET
40.0000 mg | ORAL_TABLET | Freq: Every day | ORAL | 3 refills | Status: DC
Start: 2018-10-19 — End: 2019-10-19

## 2018-10-19 MED ORDER — ROSUVASTATIN 20 MG TABLET
20.0000 mg | ORAL_TABLET | Freq: Every day | ORAL | 3 refills | Status: DC
Start: 2018-10-19 — End: 2019-09-12

## 2018-10-26 ENCOUNTER — Encounter (HOSPITAL_BASED_OUTPATIENT_CLINIC_OR_DEPARTMENT_OTHER): Payer: Self-pay | Admitting: Nurse Practitioner

## 2018-10-27 ENCOUNTER — Encounter (HOSPITAL_BASED_OUTPATIENT_CLINIC_OR_DEPARTMENT_OTHER): Payer: Self-pay | Admitting: Family Medicine

## 2018-10-29 ENCOUNTER — Encounter (HOSPITAL_BASED_OUTPATIENT_CLINIC_OR_DEPARTMENT_OTHER): Payer: Self-pay | Admitting: PHYSICIAN ASSISTANT

## 2018-10-31 ENCOUNTER — Other Ambulatory Visit (HOSPITAL_BASED_OUTPATIENT_CLINIC_OR_DEPARTMENT_OTHER): Payer: Self-pay | Admitting: Cardiovascular Disease

## 2018-11-01 ENCOUNTER — Ambulatory Visit (HOSPITAL_BASED_OUTPATIENT_CLINIC_OR_DEPARTMENT_OTHER): Payer: Self-pay

## 2018-11-06 ENCOUNTER — Other Ambulatory Visit (HOSPITAL_BASED_OUTPATIENT_CLINIC_OR_DEPARTMENT_OTHER): Payer: Self-pay | Admitting: Family Medicine

## 2018-11-06 DIAGNOSIS — J209 Acute bronchitis, unspecified: Secondary | ICD-10-CM

## 2018-11-10 ENCOUNTER — Ambulatory Visit: Payer: Commercial Managed Care - PPO | Attending: Surgery

## 2018-11-10 ENCOUNTER — Ambulatory Visit (INDEPENDENT_AMBULATORY_CARE_PROVIDER_SITE_OTHER): Payer: Commercial Managed Care - PPO | Admitting: PHYSICIAN ASSISTANT

## 2018-11-10 ENCOUNTER — Encounter (HOSPITAL_BASED_OUTPATIENT_CLINIC_OR_DEPARTMENT_OTHER): Payer: Self-pay | Admitting: PHYSICIAN ASSISTANT

## 2018-11-10 ENCOUNTER — Other Ambulatory Visit (HOSPITAL_COMMUNITY): Payer: Self-pay | Admitting: Surgery

## 2018-11-10 VITALS — BP 111/69 | HR 65 | Temp 98.5°F | Ht 66.0 in | Wt 202.6 lb

## 2018-11-10 DIAGNOSIS — E890 Postprocedural hypothyroidism: Secondary | ICD-10-CM | POA: Insufficient documentation

## 2018-11-10 DIAGNOSIS — E039 Hypothyroidism, unspecified: Secondary | ICD-10-CM

## 2018-11-10 DIAGNOSIS — Z9889 Other specified postprocedural states: Principal | ICD-10-CM

## 2018-11-10 DIAGNOSIS — H6983 Other specified disorders of Eustachian tube, bilateral: Secondary | ICD-10-CM

## 2018-11-10 DIAGNOSIS — Z1211 Encounter for screening for malignant neoplasm of colon: Secondary | ICD-10-CM

## 2018-11-10 DIAGNOSIS — F329 Major depressive disorder, single episode, unspecified: Secondary | ICD-10-CM

## 2018-11-10 DIAGNOSIS — F32A Depression, unspecified: Secondary | ICD-10-CM

## 2018-11-10 DIAGNOSIS — E118 Type 2 diabetes mellitus with unspecified complications: Secondary | ICD-10-CM

## 2018-11-10 DIAGNOSIS — R52 Pain, unspecified: Secondary | ICD-10-CM

## 2018-11-10 DIAGNOSIS — Z79899 Other long term (current) drug therapy: Secondary | ICD-10-CM | POA: Insufficient documentation

## 2018-11-10 DIAGNOSIS — K219 Gastro-esophageal reflux disease without esophagitis: Secondary | ICD-10-CM

## 2018-11-10 DIAGNOSIS — Z124 Encounter for screening for malignant neoplasm of cervix: Secondary | ICD-10-CM

## 2018-11-10 DIAGNOSIS — Z1159 Encounter for screening for other viral diseases: Secondary | ICD-10-CM

## 2018-11-10 LAB — THYROXINE, TOTAL T4: T4 TOTAL: 6.6 ug/dL (ref 5.5–11.0)

## 2018-11-10 LAB — T-UPTAKE: T-UPTAKE: 0.974 U (ref 0.749–1.400)

## 2018-11-10 LAB — THYROID STIMULATING HORMONE (SENSITIVE TSH): TSH: 2.5 u[IU]/mL (ref 0.465–4.680)

## 2018-11-10 MED ORDER — RANITIDINE 150 MG TABLET
150.00 mg | ORAL_TABLET | Freq: Two times a day (BID) | ORAL | 1 refills | Status: DC
Start: 2018-11-10 — End: 2018-11-22

## 2018-11-10 MED ORDER — LORATADINE 5 MG-PSEUDOEPHEDRINE ER 120 MG TABLET,EXTENDED RELEASE,12HR
1.00 | ORAL_TABLET | Freq: Two times a day (BID) | ORAL | 0 refills | Status: AC
Start: 2018-11-10 — End: 2018-11-24

## 2018-11-10 MED ORDER — LORATADINE 10 MG TABLET
10.0000 mg | ORAL_TABLET | Freq: Every day | ORAL | 2 refills | Status: DC
Start: 2018-11-10 — End: 2019-03-01

## 2018-11-10 MED ORDER — VENLAFAXINE ER 37.5 MG CAPSULE,EXTENDED RELEASE 24 HR
37.5000 mg | ORAL_CAPSULE | Freq: Every day | ORAL | 1 refills | Status: DC
Start: 2018-11-10 — End: 2018-12-22

## 2018-11-10 MED ORDER — LORATADINE 10 MG TABLET
10.0000 mg | ORAL_TABLET | Freq: Every day | ORAL | 0 refills | Status: DC
Start: 2018-11-10 — End: 2018-12-10

## 2018-11-10 MED ORDER — IBUPROFEN 800 MG TABLET
800.00 mg | ORAL_TABLET | Freq: Three times a day (TID) | ORAL | 0 refills | Status: DC | PRN
Start: 2018-11-10 — End: 2019-03-01

## 2018-11-10 MED ORDER — DICLOFENAC 1 % TOPICAL GEL
2.0000 g | Freq: Three times a day (TID) | CUTANEOUS | 3 refills | Status: DC | PRN
Start: 2018-11-10 — End: 2019-03-01

## 2018-11-12 ENCOUNTER — Encounter (HOSPITAL_BASED_OUTPATIENT_CLINIC_OR_DEPARTMENT_OTHER): Payer: Self-pay | Admitting: PHYSICIAN ASSISTANT

## 2018-11-12 NOTE — Progress Notes (Signed)
Patient Name: Monique [name]  Date: 11/10/2018  CCM [facility] [place], [place]  [phone]  [place] [address]  [phone]  MRN: [id number]  DOB: [birth year]    Chief complaint: Diabetes, obesity, depression    HPI:  The patient is a 52 y.o. old female who came in today for:    Diabetes mellitus, type II: Last HbA1C was 10.7% on 10/05/18. Symptoms do not include polydipsia, polyuria, paresthesias of the feet: not present, hypoglycemic unawareness, chest pain, dyspnea on exertion, diarrhea, foot ulcerations, visual disturbances. Patient checks blood sugars Checks 2 per day, and they are better controlled. Current medications include metformin 500 mg BID and Bydureon 2 mg weekly, as well as Glyxambi, which was restarted at last visit. Patient is compliant with medications and does need medication refills.     Obesity: Patient's BMI is 32.7 today. She has history of gastric bypass by Dr. Pearlie Oyster at [facility] and lost several pounds. She was taken off of her diabetic medications at that time. Since then, she admits to not monitoring her diet and had regained weight at her last visit. She is upset by this and has been attempting to make changes within the past week. She states that she had tried a 1 month supply of Adipex to "jump start" her weight loss, and has lost 14 lbs in the past 6 weeks. She is satisfied with her weight loss.     Depression: Patient reports frequent crying spells, feeling overwhelmed, feeling down, tired and little energy since the passing of her father 2 weeks ago. She states that his death was somewhat expected, but came sooner than they thought. She would like a medication to help with her depression. She denies SI/HI. She states that she has been more irritable with family members recently.     ROS:  Constitutional: +tiredness. Denies unintentional weight gain/loss, fever, chills, fatigue  Skin: Denies skin problems or rashes, itching  Musculoskeletal: Denies  unilateral muscle weakness, atrophy, joint pain/swelling  HEENT: +nasasl congestion, rhinorrhea. Denies blurred vision, vision loss, otalgia, hearing loss, epistaxis, sore throat, difficulty swallowing  Cardiovascular: Denies heart valve problems/murmurs, irregular heartbeat, angina/chest pain, swelling in feet, swelling in hands, shortness of breath lying flat, short of breath climbing stairs, palpitations  Respiratory: Denies shortness of breath, productive cough, hemoptysis, wheezing  Gastrointestinal: Denies abdominal pain, diarrhea, constipation, vomiting blood, black or tarry stools, nausea, GERD  Genitourinary: Denies hematuria, change in urinary stream, urinary frequency, incontinence, dysuria, nocturia, sexual dysfunction  Neurologic: Denies headache, seizures, stroke, dizziness, loss of consciousness, paresthesias, numbness  Psychiatric: +depression. Denies memory loss, suicidal/homicidal thoughts, insomnia  Endocrine: Denies polydipsia, polyuria, thyroid problems, heat intolerance, cold intolerance    Past medical history:  Past Medical History:   Diagnosis Date   . Anxiety    . Carotid stenosis    . Chronic back pain    . Coronary artery disease    . DDD (degenerative disc disease), lumbar     "entire spine"   . Ear piercing    . GERD (gastroesophageal reflux disease)    . H/O complete eye exam     2 years, Dr. [name], at [facility]   . History of dental examination     dentures upper and lower   . HTN    . Hyperlipidemia    . MI (myocardial infarction) (CMS [facility]) 2012   . Neck problem     herniated cervical disc   . Obesity (BMI 30-39.9)    .  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 [place] ordered.    . Stroke (CMS [facility])     2002   . Tattoo     Left breast, Right shoulder   . Type 2 diabetes mellitus (CMS HCC)      Past surgical history:  Past Surgical History:   Procedure Laterality Date   . ENDOMETRIAL ABLATION  1998   . HX ANKLE FRACTURE TX  1990s    Right,  Doran Durand procedure   . HX CATARACT REMOVAL  2005    r and l eyes with implants   . HX CESAREAN SECTION  06/20/2000    x3, 11/21/86, 11/10/84   . HX CHOLECYSTECTOMY  1988   . HX CORONARY ARTERY BYPASS GRAFT  03/31/2011    cardiac, 2 vessel, Dr. [name], [facility]   . HX CORONARY STENT PLACEMENT  2012    x4 stents   . HX GASTRIC SLEEVE  02/2017    [place], [place], Dr. Pearlie Oyster   . HX HEART CATHETERIZATION  2012    massive heart attack and stents - [facility]   . HX THYROID BIOPSY     . HX THYROIDECTOMY  101/01/2018   . HX TONSILLECTOMY      as child     Medications:  Current Outpatient Medications   Medication Sig   . ACCU-CHEK AVIVA PLUS TEST STRP Strip 1 Strip by Subcutaneous route Three times a day as needed   . BYDUREON 2 mg Subcutaneous 2 mg by Subcutaneous route Every 7 days   . clopidogrel (PLAVIX) 75 mg Oral Tablet Take 1 Tab (75 mg total) by mouth Once a day   . cyanocobalamin (VITAMIN B12) 1,000 mcg/mL Injection Solution 1 mL (1,000 mcg total) by Subcutaneous route Every 30 days   . diclofenac sodium (VOLTAREN) 1 % Gel 2 g by Apply Topically route Three times a day as needed   . docusate sodium (COLACE) 100 mg Oral Capsule Take 1 Cap (100 mg total) by mouth Twice daily   . empagliflozin-linagliptin (GLYXAMBI) 10-5 mg Oral Tablet Take 1 Tab by mouth Once a day   . ergocalciferol, vitamin D2, (DRISDOL) 50,000 unit Oral Capsule Take 1 Cap (50,000 Units total) by mouth Every 7 days   . flash glucose scanning reader (FREESTYLE LIBRE 14 DAY READER) Does not apply Misc For daily use for 14 days. (Patient not taking: Reported on 11/10/2018)   . flash glucose sensor (FREESTYLE LIBRE 14 DAY SENSOR) Does not apply Kit For daily use for 14 days. (Patient not taking: Reported on 11/10/2018)   . furosemide (LASIX) 40 mg Oral Tablet Take 1 Tab (40 mg total) by mouth Once a day   . HYDROcodone-acetaminophen (NORCO) 10-325 mg Oral Tablet Take 1 Tab by mouth Four times a day    . Ibuprofen (MOTRIN) 800 mg Oral Tablet Take 1 Tab  (800 mg total) by mouth Three times a day as needed for Pain   . levothyroxine (SYNTHROID) 25 mcg Oral Tablet Take 25 mcg by mouth Once a day   . lisinopril (PRINIVIL) 2.5 mg Oral Tablet take 1 tablet by mouth once daily   . loratadine (CLARITIN) 10 mg Oral Tablet Take 1 Tab (10 mg total) by mouth Once a day for 30 days   . loratadine (CLARITIN) 10 mg Oral Tablet Take 1 Tab (10 mg total) by mouth Once a day   . loratadine-pseudoephedrine (CLARITIN-D) 5-120 mg Oral Tablet Sustained  Release 12 hr Take 1 Tab by mouth Twice daily for 14 days   . metFORMIN (GLUCOPHAGE) 500 mg Oral Tablet Take 1 Tab (500 mg total) by mouth Twice daily with food   . metoprolol succinate (TOPROL-XL) 25 mg Oral Tablet Sustained Release 24 hr TAKE 1/2 TABLET ONCE DAILY (Patient taking differently: Once a day 1/2 tab daily)   . niacin (NIASPAN) 500 mg Oral Tablet Sustained Release Take 1 Tab (500 mg total) by mouth Once a day   . nitroGLYCERIN (NITROSTAT) 0.4 mg Sublingual Tablet, Sublingual 1 Tab (0.4 mg total) by Sublingual route Every 5 minutes as needed for Chest pain for 3 doses over 15 minutes   . omeprazole (PRILOSEC) 40 mg Oral Capsule, Delayed Release(E.C.) Take 1 Cap (40 mg total) by mouth Once a day   . ondansetron (ZOFRAN ODT) 8 mg Oral Tablet, Rapid Dissolve Take 1 Tab (8 mg total) by mouth Every 8 hours as needed for nausea/vomiting   . PRENATAL PLUS, CALCIUM CARB, 27 mg iron- 1 mg Oral Tablet Take 1 Tab by mouth Once a day   . raNITIdine (ZANTAC) 150 mg Oral Tablet Take 1 Tab (150 mg total) by mouth Twice daily   . rosuvastatin (CRESTOR) 20 mg Oral Tablet Take 1 Tab (20 mg total) by mouth Once a day   . venlafaxine (EFFEXOR XR) 37.5 mg Oral Capsule, Sust. Release 24 hr Take 1 Cap (37.5 mg total) by mouth Once a day   . VENTOLIN HFA 90 mcg/actuation Inhalation HFA Aerosol Inhaler Take 1-2 Puffs by inhalation Every 6 hours as needed     Allergies:  Allergies   Allergen Reactions   . Tylenol W Codeine [Acetaminophen-Codeine]       pt states that one time she had chest pains with this med, but dr. thought it was because she had not eaten     Family history:  Family Medical History:     Problem Relation (Age of Onset)    Atrial fibrillation Sister    Colon Cancer Father    Diabetes Mother    Heart Disease Sister, Maternal Grandfather    Hypertension (High Blood Pressure) Mother    Lung Cancer Maternal Grandfather    MI <22 years of age Mother        Social history:  Social History     Socioeconomic History   . Marital status: Married     Spouse name: [name]   . Number of children: 3   . Years of education: completed 11th grade   . Highest education level: Not on file   Occupational History   . Occupation: disabled   Social Needs   . Financial resource strain: Not hard at all   . Food insecurity:     Worry: Never true     Inability: Never true   . Transportation needs:     Medical: No     Non-medical: No   Tobacco Use   . Smoking status: Current Every Day Smoker     Packs/day: 0.50     Years: 20.00     Pack years: 10.00     Types: Cigarettes     Start date: 67   . Smokeless tobacco: Never Used   Substance and Sexual Activity   . Alcohol use: No     Frequency: Never     Binge frequency: Never   . Drug use: No   . Sexual activity: Yes     Partners: Male   Lifestyle   .  Physical activity:     Days per week: 0 days     Minutes per session: 0 min   . Stress: To some extent   Relationships   . Social connections:     Talks on phone: More than three times a week     Gets together: More than three times a week     Attends religious service: Never     Active member of club or organization: No     Attends meetings of clubs or organizations: Never     Relationship status: Married   . Intimate partner violence:     Fear of current or ex partner: No     Emotionally abused: No     Physically abused: No     Forced sexual activity: No   Other Topics Concern   . Routine Exercise No   . Ability to Walk 2 Flight of Steps without SOB/CP Yes   Social History  Narrative    Living situation: lives at home with husband, [name], and daughter.  1 dog named Duke and 1 cat named Lucky.    Nutrition:  Eats 5 small meals d/t gastric sleeve surgery.     Caffeine use: none, decaf coffee    Exercise: stays busy around house. No formal form of exercise    Seatbelt use: sometimes    Fire extinguishers in the home: yes    Smoke alarms in the home: yes    Carbon monoxide detectors in the home: yes    Nancy Fetter exposure: sunglasses        Huong would like for husband, Shashana Fullington,  to speak for her in the event she would become incapacitated.    Full code.      Vitals:    11/10/18 1458   BP: 111/69   Pulse: 65   Temp: 36.9 C (98.5 F)   SpO2: 97%   Weight: 91.9 kg (202 lb 9.6 oz)   Height: 1.676 m (5' 6" )   BMI: 32.77     Physical Examination:  GENERAL:   Pt is a pleasant, obese, well-developed 52 y.o. female who is in NAD. Appears stated age  34:  head normocephalic, symmetrical facies. +inferior turbinate hypertrophy and serous effusions of bilateral ears.   NECK: +well-healed transverse scar of the anterior neck from recent thyroidectomy is well approximated and without signs of infection. supple. No masses, lymphadenopathy, JVD on exam. Trachea midline, no thyromegaly   CV: S1, S2. No murmurs, rubs, gallops.     LUNGS: CTAB, No rhonchi, rales, wheezes.   MSK: Spontaneous normal AROM of major joints as observed during exam. No joint effusions/ swelling/ deformities.   No BLE edema, clubbing, or cyanosis.   Gait normal.  SKIN: No significant lesions, rashes, ecchymoses noted.   NEURO: AAOx4, CN grossly intact. Sensation intact b/l. No focal deficits.  PSYCH: Mood (depressed, cries easily), behavior and affect normal w/ Intact judgement & insight     Diabetes Monitors  A1C: 10.7  A1C Date: 10/05/2018          Urine Microalbumin: 0.54   Microalbumin Date: 09/29/2018    Last Lipid Panel  (Last result in the past 2 years)      Cholesterol   HDL   LDL   Direct LDL   Triglycerides       03/29/18 0958 134  Comment:  Total Cholesterol Classification:     <200        Desirable  200-239     Borderline High        > or = 240  High 52  Comment:  HDL Cholesterol Classification:          <40        Low        > or = 60  High   52  Comment:  LDL Cholesterol Classification:         <100     Optimal       100-129  Near or Above Optimal       130-159  Borderline High       160-189  High       >190     Very High   151  Comment:  Triglyceride Classification:            <150    Normal       150-199 Borderline-High       200-499 High       >500    Very High        Retinal Exam Date: Not Found  Last diabetic foot exam: Not Found    Lab Results   Component Value Date    TSH 2.500 11/10/2018    FREET4 1.06 03/29/2018     Visit Diagnosis    Type 2 diabetes mellitus with complication, without long-term current use of insulin (CMS HCC)  Chronic, uncontrolled.   HbA1C worsened from 9.6% to 10.7% on 10/05/18.   Patient has been losing weight and we added Glyxambi at last visit, she states that fingersticks are better controlled at this time.   Continue metformin 500 mg BID.   Continue Bydureon 2 mg every 7 days.    Continue Glyxabmi 10-5 mg daily.   Encouraged her to check blood sugars 3-4 times daily and make a log for [place] to review at her next appointment.   Discuss renal concerns and need for monitoring. Labs ordered today.     Class 1 obesity with serious comorbidity and body mass index (BMI) of 34.0 to 34.9 in adult, unspecified obesity type  Discussed diet and lifestyle modifications.   She completed 1 month of Adipex and has lost 14 lbs. Encouraged her to continue lifestyle modifications at this time to continue weight loss. No new prescription of Adipex provided.   S/P bariatric surgery at [facility].     Depression, unspecified depression type  Chronic, recently worsened by father's passing.   Discussed multiple options for treatment.   Start Effexor 37.5 mg daily. Discussed risks/benefits.   No SI/HI noted today.  Notify office or seek care in the ED if these thoughts develop.     Colon cancer screening  Patient referred for colonoscopy.     Encounter for hepatitis C screening test for low risk patient  Screening test ordered.     Cervical cancer screening  Referral made for screening Pap smear.     Chronic pain  Follows with pain management and receives Norco prescriptions.   Continue topical NSAID's.   She takes oral NSAID's only as needed.    Gastroesophageal reflux disease, esophagitis presence not specified  Chronic, Controlled problem   Implement Lifestyle Modifications:  Elevate head of bed, Dietary modification,  Refrain from assuming supine position after meals, Avoid tight fitting clothes, Loose  excess weight, Chew gum or lozenges to increase salivation, Refrain from alcohol,  Continue Zantac.     Eustachian tube dysfunction, bilateral  Discussed that this is likely due to  environmental and/or viral illness.   Continue supportive measures.   Start Claritin-D short term, continue Claritin following 10 days of decongestant therapy.     Orders Placed This Encounter   . CANCELED: HEPATITIS UNKNOWN ABC   . COMPREHENSIVE METABOLIC PNL, FASTING   . OUTSIDE CONSULT/REFERRAL PROVIDER(AMB)   . OUTSIDE CONSULT/REFERRAL PROVIDER(AMB)   . diclofenac sodium (VOLTAREN) 1 % Gel   . raNITIdine (ZANTAC) 150 mg Oral Tablet   . Ibuprofen (MOTRIN) 800 mg Oral Tablet   . loratadine-pseudoephedrine (CLARITIN-D) 5-120 mg Oral Tablet Sustained Release 12 hr   . loratadine (CLARITIN) 10 mg Oral Tablet   . loratadine (CLARITIN) 10 mg Oral Tablet   . venlafaxine (EFFEXOR XR) 37.5 mg Oral Capsule, Sust. Release 24 hr     Return in about 6 weeks (around 12/22/2018), or if symptoms worsen or fail to improve, for depression, DMII.    Chryl Heck, PA-C  11/12/2018, 08:27

## 2018-11-13 ENCOUNTER — Ambulatory Visit (HOSPITAL_BASED_OUTPATIENT_CLINIC_OR_DEPARTMENT_OTHER): Payer: Commercial Managed Care - PPO

## 2018-11-17 ENCOUNTER — Ambulatory Visit (INDEPENDENT_AMBULATORY_CARE_PROVIDER_SITE_OTHER): Payer: Self-pay | Admitting: Ophthalmology

## 2018-11-17 ENCOUNTER — Other Ambulatory Visit (HOSPITAL_BASED_OUTPATIENT_CLINIC_OR_DEPARTMENT_OTHER): Payer: Self-pay | Admitting: Family Medicine

## 2018-11-17 DIAGNOSIS — R110 Nausea: Secondary | ICD-10-CM

## 2018-11-17 MED ORDER — ONDANSETRON 8 MG DISINTEGRATING TABLET
8.00 mg | ORAL_TABLET | Freq: Three times a day (TID) | ORAL | 1 refills | Status: DC | PRN
Start: 2018-11-17 — End: 2019-03-01

## 2018-11-22 ENCOUNTER — Other Ambulatory Visit (HOSPITAL_BASED_OUTPATIENT_CLINIC_OR_DEPARTMENT_OTHER): Payer: Self-pay | Admitting: Family Medicine

## 2018-11-22 DIAGNOSIS — K219 Gastro-esophageal reflux disease without esophagitis: Secondary | ICD-10-CM

## 2018-11-22 MED ORDER — FAMOTIDINE 10 MG TABLET
10.00 mg | ORAL_TABLET | Freq: Two times a day (BID) | ORAL | 1 refills | Status: DC
Start: 2018-11-22 — End: 2018-12-02

## 2018-12-02 ENCOUNTER — Other Ambulatory Visit (HOSPITAL_BASED_OUTPATIENT_CLINIC_OR_DEPARTMENT_OTHER): Payer: Self-pay | Admitting: Family Medicine

## 2018-12-02 DIAGNOSIS — K219 Gastro-esophageal reflux disease without esophagitis: Secondary | ICD-10-CM

## 2018-12-02 MED ORDER — OMEPRAZOLE 40 MG CAPSULE,DELAYED RELEASE
40.0000 mg | DELAYED_RELEASE_CAPSULE | Freq: Every day | ORAL | 2 refills | Status: DC
Start: 2018-12-02 — End: 2019-03-01

## 2018-12-07 ENCOUNTER — Encounter (INDEPENDENT_AMBULATORY_CARE_PROVIDER_SITE_OTHER): Payer: Self-pay

## 2018-12-07 ENCOUNTER — Ambulatory Visit (INDEPENDENT_AMBULATORY_CARE_PROVIDER_SITE_OTHER): Payer: Commercial Managed Care - PPO

## 2018-12-07 DIAGNOSIS — Z6835 Body mass index (BMI) 35.0-35.9, adult: Secondary | ICD-10-CM

## 2018-12-07 DIAGNOSIS — J0100 Acute maxillary sinusitis, unspecified: Secondary | ICD-10-CM

## 2018-12-07 DIAGNOSIS — R059 Cough, unspecified: Secondary | ICD-10-CM | POA: Insufficient documentation

## 2018-12-07 DIAGNOSIS — R05 Cough: Secondary | ICD-10-CM | POA: Insufficient documentation

## 2018-12-07 DIAGNOSIS — R0981 Nasal congestion: Secondary | ICD-10-CM | POA: Insufficient documentation

## 2018-12-07 DIAGNOSIS — J209 Acute bronchitis, unspecified: Secondary | ICD-10-CM

## 2018-12-07 MED ORDER — AMOXICILLIN 875 MG-POTASSIUM CLAVULANATE 125 MG TABLET
1.00 | ORAL_TABLET | Freq: Two times a day (BID) | ORAL | 0 refills | Status: AC
Start: 2018-12-07 — End: 2018-12-17

## 2018-12-07 MED ORDER — VENTOLIN HFA 90 MCG/ACTUATION AEROSOL INHALER
1.00 | INHALATION_SPRAY | Freq: Four times a day (QID) | RESPIRATORY_TRACT | 1 refills | Status: DC | PRN
Start: 2018-12-07 — End: 2021-03-19

## 2018-12-07 MED ORDER — PREDNISONE 10 MG TABLET
ORAL_TABLET | ORAL | 0 refills | Status: DC
Start: 2018-12-07 — End: 2018-12-10

## 2018-12-07 MED ORDER — PYRILAMINE 7.5 MG-DEXTROMETHORPHAN 7.5 MG/5 ML ORAL LIQUID
7.5000 mL | Freq: Three times a day (TID) | ORAL | 1 refills | Status: DC | PRN
Start: 2018-12-07 — End: 2019-03-01

## 2018-12-07 MED ORDER — PROVENTIL HFA 90 MCG/ACTUATION AEROSOL INHALER
1.00 | INHALATION_SPRAY | RESPIRATORY_TRACT | 1 refills | Status: DC | PRN
Start: 2018-12-07 — End: 2021-05-27

## 2018-12-07 NOTE — Progress Notes (Signed)
Patient Name: Monique [name]  Date: 12/07/2018  [place]  [address]  [place] [address]  [phone]  MRN: [id number]  DOB: 1966/01/22    Chief complaint:   Cough; Ear Pain (both); and Headache.    HPI:  The patient is a 52 y.o. old female who came in today for as stated above.  Patient has been having symptoms for last 7 days. No current tx.      ROS:  Review of Systems   Constitutional: Negative.    HENT: Negative.    Eyes: Negative.    Respiratory: Negative.    Cardiovascular: Negative.    Gastrointestinal: Negative.    Musculoskeletal: Negative.    Skin: Negative.    Neurological: Negative.    Psychiatric/Behavioral: Negative.    All other systems reviewed and are negative.          Past medical history:  Past Medical History:   Diagnosis Date   . Anxiety    . Carotid stenosis    . Chronic back pain    . Coronary artery disease    . DDD (degenerative disc disease), lumbar     "entire spine"   . Ear piercing    . GERD (gastroesophageal reflux disease)    . H/O complete eye exam     2 years, Dr. [name], at [facility]   . History of dental examination     dentures upper and lower   . HTN    . Hyperlipidemia    . MI (myocardial infarction) (CMS [place]) 2012   . Neck problem     herniated cervical disc   . Obesity (BMI 30-39.9)    . 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 [place] ordered.    . Stroke (CMS [facility])     2002   . Tattoo     Left breast, Right shoulder   . Type 2 diabetes mellitus (CMS HCC)          Past surgical history:  Past Surgical History:   Procedure Laterality Date   . ENDOMETRIAL ABLATION  1998   . HX ANKLE FRACTURE TX  1990s    Right, Doran Durand procedure   . HX CATARACT REMOVAL  2005    r and l eyes with implants   . HX CESAREAN SECTION  06/20/2000    x3, 11/21/86, 11/10/84   . HX CHOLECYSTECTOMY  1988   . HX CORONARY ARTERY BYPASS GRAFT  03/31/2011    cardiac, 2 vessel, Dr. [name], [facility]   . HX  CORONARY STENT PLACEMENT  2012    x4 stents   . HX GASTRIC SLEEVE  02/2017    [place], [place], Dr. Pearlie Oyster   . HX HEART CATHETERIZATION  2012    massive heart attack and stents - [facility]   . HX THYROID BIOPSY     . HX THYROIDECTOMY  101/01/2018   . HX TONSILLECTOMY      as child         Medications:  Current Outpatient Medications   Medication Sig   . ACCU-CHEK AVIVA PLUS TEST STRP Strip 1 Strip by Subcutaneous route Three times a day as needed   . amoxicillin-pot clavulanate (AUGMENTIN) 875-125 mg Oral Tablet Take 1 Tab by mouth Twice daily for 10 days   . BYDUREON 2 mg Subcutaneous 2 mg by Subcutaneous route Every 7 days   . clopidogrel (PLAVIX)  75 mg Oral Tablet Take 1 Tab (75 mg total) by mouth Once a day   . cyanocobalamin (VITAMIN B12) 1,000 mcg/mL Injection Solution 1 mL (1,000 mcg total) by Subcutaneous route Every 30 days   . diclofenac sodium (VOLTAREN) 1 % Gel 2 g by Apply Topically route Three times a day as needed   . docusate sodium (COLACE) 100 mg Oral Capsule Take 1 Cap (100 mg total) by mouth Twice daily   . empagliflozin-linagliptin (GLYXAMBI) 10-5 mg Oral Tablet Take 1 Tab by mouth Once a day   . ergocalciferol, vitamin D2, (DRISDOL) 50,000 unit Oral Capsule Take 1 Cap (50,000 Units total) by mouth Every 7 days   . furosemide (LASIX) 40 mg Oral Tablet Take 1 Tab (40 mg total) by mouth Once a day   . HYDROcodone-acetaminophen (NORCO) 10-325 mg Oral Tablet Take 1 Tab by mouth Four times a day    . Ibuprofen (MOTRIN) 800 mg Oral Tablet Take 1 Tab (800 mg total) by mouth Three times a day as needed for Pain   . levothyroxine (SYNTHROID) 25 mcg Oral Tablet Take 25 mcg by mouth Once a day   . lisinopril (PRINIVIL) 2.5 mg Oral Tablet take 1 tablet by mouth once daily   . loratadine (CLARITIN) 10 mg Oral Tablet Take 1 Tab (10 mg total) by mouth Once a day for 30 days   . loratadine (CLARITIN) 10 mg Oral Tablet Take 1 Tab (10 mg total) by mouth Once a day   . metFORMIN (GLUCOPHAGE) 500 mg Oral Tablet Take 1 Tab  (500 mg total) by mouth Twice daily with food   . metoprolol succinate (TOPROL-XL) 25 mg Oral Tablet Sustained Release 24 hr TAKE 1/2 TABLET ONCE DAILY (Patient not taking: 1/2 tab daily)   . niacin (NIASPAN) 500 mg Oral Tablet Sustained Release Take 1 Tab (500 mg total) by mouth Once a day   . nitroGLYCERIN (NITROSTAT) 0.4 mg Sublingual Tablet, Sublingual 1 Tab (0.4 mg total) by Sublingual route Every 5 minutes as needed for Chest pain for 3 doses over 15 minutes   . omeprazole (PRILOSEC) 40 mg Oral Capsule, Delayed Release(E.C.) Take 1 Cap (40 mg total) by mouth Once a day   . ondansetron (ZOFRAN ODT) 8 mg Oral Tablet, Rapid Dissolve Take 1 Tab (8 mg total) by mouth Every 8 hours as needed for nausea/vomiting   . predniSONE (DELTASONE) 10 mg Oral Tablet 3 tabs PO for 2 days, then 2 tabs PO for 2 days, then 1 tab PO for 2 days.   Marland Kitchen PRENATAL PLUS, CALCIUM CARB, 27 mg iron- 1 mg Oral Tablet Take 1 Tab by mouth Once a day   . PROVENTIL HFA 90 mcg/actuation Inhalation HFA Aerosol Inhaler Take 1-2 Puffs by inhalation Every 4 hours as needed   . pyrilamine-dextromethorphan (CAPRON DM) 7.5-7.5 mg/5 mL Oral Liquid Take 7.5 mL by mouth Three times a day as needed   . rosuvastatin (CRESTOR) 20 mg Oral Tablet Take 1 Tab (20 mg total) by mouth Once a day   . venlafaxine (EFFEXOR XR) 37.5 mg Oral Capsule, Sust. Release 24 hr Take 1 Cap (37.5 mg total) by mouth Once a day   . VENTOLIN HFA 90 mcg/actuation Inhalation HFA Aerosol Inhaler Take 1-2 Puffs by inhalation Every 6 hours as needed     Allergies:  Allergies   Allergen Reactions   . Tylenol W Codeine [Acetaminophen-Codeine]      pt states that one time she had chest pains with this  med, but dr. thought it was because she had not eaten     Family history:  Family Medical History:     Problem Relation (Age of Onset)    Atrial fibrillation Sister    Colon Cancer Father    Diabetes Mother    Heart Disease Sister, Maternal Grandfather    Hypertension (High Blood Pressure) Mother     Lung Cancer Maternal Grandfather    MI <79 years of age Mother            Social history:  Social History     Socioeconomic History   . Marital status: Married     Spouse name: [name]   . Number of children: 3   . Years of education: completed 11th grade   . Highest education level: Not on file   Occupational History   . Occupation: disabled   Social Needs   . Financial resource strain: Not hard at all   . Food insecurity:     Worry: Never true     Inability: Never true   . Transportation needs:     Medical: No     Non-medical: No   Tobacco Use   . Smoking status: Current Every Day Smoker     Packs/day: 0.50     Years: 20.00     Pack years: 10.00     Types: Cigarettes     Start date: 21   . Smokeless tobacco: Never Used   Substance and Sexual Activity   . Alcohol use: No     Frequency: Never     Binge frequency: Never   . Drug use: No   . Sexual activity: Yes     Partners: Male   Lifestyle   . Physical activity:     Days per week: 0 days     Minutes per session: 0 min   . Stress: To some extent   Relationships   . Social connections:     Talks on phone: More than three times a week     Gets together: More than three times a week     Attends religious service: Never     Active member of club or organization: No     Attends meetings of clubs or organizations: Never     Relationship status: Married   . Intimate partner violence:     Fear of current or ex partner: No     Emotionally abused: No     Physically abused: No     Forced sexual activity: No   Other Topics Concern   . Routine Exercise No   . Ability to Walk 2 Flight of Steps without SOB/CP Yes   Social History Narrative    Living situation: lives at home with husband, [name], and daughter.  1 dog named Duke and 1 cat named Lucky.    Nutrition:  Eats 5 small meals d/t gastric sleeve surgery.     Caffeine use: none, decaf coffee    Exercise: stays busy around house. No formal form of exercise    Seatbelt use: sometimes    Fire extinguishers in the home: yes     Smoke alarms in the home: yes    Carbon monoxide detectors in the home: yes    Nancy Fetter exposure: sunglasses        Marijane would like for husband, Jacie Tristan,  to speak for her in the event she would become incapacitated.    Full code.  Vitals:    12/07/18 1122   BP: 116/64   Pulse: 84   Resp: 20   Temp: 35.8 C (96.5 F)   SpO2: 95%   Weight: 98.4 kg (217 lb)   Height: 1.676 m (5\' 6" )   BMI: 35.1         Body mass index is 35.02 kg/m.    Physical Examination:  Physical Exam   Constitutional: She is oriented to person, place, and time and well-developed, well-nourished, and in no distress.   HENT:   Head: Normocephalic and atraumatic.   Nasal mucosa is erythematous and edematous    PND       Eyes: Pupils are equal, round, and reactive to light. Conjunctivae and EOM are normal.   Neck: Normal range of motion. Neck supple.   Cardiovascular: Normal rate, regular rhythm and normal heart sounds.   Pulmonary/Chest: Effort normal and breath sounds normal.   Abdominal: Soft. Bowel sounds are normal.   Musculoskeletal: Normal range of motion.   Neurological: She is alert and oriented to person, place, and time. Gait normal.   Skin: Skin is warm and dry.   Psychiatric: Affect normal.   Nursing note and vitals reviewed.         Visit Diagnosis and plan    ICD-10-CM    1. Acute bronchitis, unspecified organism J20.9 VENTOLIN HFA 90 mcg/actuation Inhalation HFA Aerosol Inhaler   2. Acute non-recurrent maxillary sinusitis J01.00      Orders Placed This Encounter   . amoxicillin-pot clavulanate (AUGMENTIN) 875-125 mg Oral Tablet   . predniSONE (DELTASONE) 10 mg Oral Tablet   . pyrilamine-dextromethorphan (CAPRON DM) 7.5-7.5 mg/5 mL Oral Liquid   . PROVENTIL HFA 90 mcg/actuation Inhalation HFA Aerosol Inhaler   . VENTOLIN HFA 90 mcg/actuation Inhalation HFA Aerosol Inhaler     No results were found from the past 30 days.    No follow-ups on file.     Patient was seen independently.      Pincus Large, PA  12/07/2018,    11:34  Electronically signed by Pincus Large, PA      I was personally available for consultation during this visit.  I have reviewed the chart and agree with the documentation as recorded by the APP, including the treatment plan and disposition.  Patient encouraged to follow up with PCP; recheck here as needed.    Ambrose Mantle, MD  12/09/2018, 11:30  Electronically signed by Ambrose Mantle, MD

## 2018-12-10 ENCOUNTER — Ambulatory Visit (HOSPITAL_BASED_OUTPATIENT_CLINIC_OR_DEPARTMENT_OTHER): Payer: Self-pay | Admitting: Family Medicine

## 2018-12-10 ENCOUNTER — Encounter (HOSPITAL_BASED_OUTPATIENT_CLINIC_OR_DEPARTMENT_OTHER): Payer: Self-pay | Admitting: PHYSICIAN ASSISTANT

## 2018-12-10 ENCOUNTER — Ambulatory Visit (INDEPENDENT_AMBULATORY_CARE_PROVIDER_SITE_OTHER): Payer: Commercial Managed Care - PPO | Admitting: PHYSICIAN ASSISTANT

## 2018-12-10 VITALS — BP 131/85 | HR 87 | Temp 97.0°F | Ht 66.0 in | Wt 198.0 lb

## 2018-12-10 DIAGNOSIS — J069 Acute upper respiratory infection, unspecified: Secondary | ICD-10-CM

## 2018-12-10 DIAGNOSIS — B9789 Other viral agents as the cause of diseases classified elsewhere: Secondary | ICD-10-CM

## 2018-12-10 DIAGNOSIS — Z6832 Body mass index (BMI) 32.0-32.9, adult: Secondary | ICD-10-CM

## 2018-12-10 MED ORDER — LORATADINE 5 MG-PSEUDOEPHEDRINE ER 120 MG TABLET,EXTENDED RELEASE,12HR
1.00 | ORAL_TABLET | Freq: Two times a day (BID) | ORAL | 0 refills | Status: AC
Start: 2018-12-10 — End: 2018-12-24

## 2018-12-10 MED ORDER — GUAIFENESIN ER 600 MG TABLET, EXTENDED RELEASE 12 HR
600.0000 mg | EXTENDED_RELEASE_TABLET | Freq: Two times a day (BID) | ORAL | 0 refills | Status: DC
Start: 2018-12-10 — End: 2019-03-01

## 2018-12-10 MED ORDER — OXYMETAZOLINE 0.05 % NASAL SPRAY
1.00 | Freq: Two times a day (BID) | NASAL | 0 refills | Status: AC
Start: 2018-12-10 — End: 2018-12-13

## 2018-12-10 NOTE — Progress Notes (Signed)
Patient Name: Monique [name]  Date: 12/10/2018  CCM [facility] [place], [place]  [place]  [place] [address]  [phone]  MRN: [id number]  DOB: 02/05/1966    Chief complaint: Cough (Pt went to urgent care and she stated they diagnosed her with bronchitis.  She states she has been taking robitussin and amoxicllin and it not feeling any better.).    HPI:  The patient is a 53 y.o. old female who came in today for an acute visit with complaints of:    Cough: Patient states that for the past week, she has had persistent nasal congestion, sinus pain/pressure, drainage, and cough productive of yellowish sputum. She denies shortness of breath or fevers/chills. Her albuterol inhaler has provided some relief. She went to Urgent Care on 12/07/18 and was given prednisone and Augmentin, and does not feel that this is helpful. She reports "itching" in her left ear as well. Zyrtec D had seemed to help, but she ran out of the medication.     ROS:  Constitutional: Denies unintentional weight gain/loss, fever, chills, fatigue, tiredness  Skin: Denies skin problems or rashes, itching  Musculoskeletal: Denies unilateral muscle weakness, atrophy, joint pain/swelling  HEENT: +otalgia, nasal congestion, facial pain. Denies blurred vision, vision loss, hearing loss, epistaxis, sore throat, difficulty swallowing  Cardiovascular: Denies heart valve problems/murmurs, irregular heartbeat, angina/chest pain, swelling in feet, swelling in hands, shortness of breath lying flat, short of breath climbing stairs, palpitations  Respiratory: +productive cough. Denies shortness of breath, hemoptysis, wheezing  Gastrointestinal: Denies abdominal pain, diarrhea, constipation, vomiting blood, black or tarry stools, nausea, GERD  Genitourinary: Denies hematuria, change in urinary stream, urinary frequency, incontinence, dysuria, nocturia, sexual dysfunction  Neurologic: +headache. Denies seizures, stroke,  dizziness, loss of consciousness, paresthesias, numbness  Psychiatric: Denies anxiety, depression, memory loss, suicidal/homicidal thoughts, insomnia  Endocrine: Denies polydipsia, polyuria, thyroid problems, heat intolerance, cold intolerance    Past medical history:  Past Medical History:   Diagnosis Date   . Anxiety    . Carotid stenosis    . Chronic back pain    . Coronary artery disease    . DDD (degenerative disc disease), lumbar     "entire spine"   . Ear piercing    . GERD (gastroesophageal reflux disease)    . H/O complete eye exam     2 years, Dr. [name], at [facility]   . History of dental examination     dentures upper and lower   . HTN    . Hyperlipidemia    . MI (myocardial infarction) (CMS [place]) 2012   . Neck problem     herniated cervical disc   . Obesity (BMI 30-39.9)    . 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 [place] ordered.    . Stroke (CMS [facility])     2002   . Tattoo     Left breast, Right shoulder   . Type 2 diabetes mellitus (CMS [facility])      Past surgical history:  Past Surgical History:   Procedure Laterality Date   . ENDOMETRIAL ABLATION  1998   . HX ANKLE FRACTURE TX  1990s    Right, Doran Durand procedure   . HX CATARACT REMOVAL  2005    r and l eyes with implants   . HX CESAREAN SECTION  06/20/2000    x3, 11/21/86, 11/10/84   . HX CHOLECYSTECTOMY  1988   .  HX CORONARY ARTERY BYPASS GRAFT  03/31/2011    cardiac, 2 vessel, Dr. [name], [facility]   . HX CORONARY STENT PLACEMENT  2012    x4 stents   . HX GASTRIC SLEEVE  02/2017    [place], [place], Dr. Pearlie Oyster   . HX HEART CATHETERIZATION  2012    mass[facility] heart [facility] stents - [facility]   . HX THYROID BIOPSY     . HX THYROIDECTOMY  101/01/2018   . HX TONSILLECTOMY      as child     Medications:  Current Outpatient Medications   Medication Sig   . ACCU-CHEK AVIVA PLUS TEST STRP Strip 1 Strip by Subcutaneous route Three times a day as needed   . amoxicillin-pot clavulanate (AUGMENTIN) 875-125 mg Oral  Tablet Take 1 Tab by mouth Twice daily for 10 days   . BYDUREON 2 mg Subcutaneous 2 mg by Subcutaneous route Every 7 days   . clopidogrel (PLAVIX) 75 mg Oral Tablet Take 1 Tab (75 mg total) by mouth Once a day   . cyanocobalamin (VITAMIN B12) 1,000 mcg/mL Injection Solution 1 mL (1,000 mcg total) by Subcutaneous route Every 30 days   . diclofenac sodium (VOLTAREN) 1 % Gel 2 g by Apply Topically route Three times a day as needed   . docusate sodium (COLACE) 100 mg Oral Capsule Take 1 Cap (100 mg total) by mouth Twice daily   . empagliflozin-linagliptin (GLYXAMBI) 10-5 mg Oral Tablet Take 1 Tab by mouth Once a day   . ergocalciferol, vitamin D2, (DRISDOL) 50,000 unit Oral Capsule Take 1 Cap (50,000 Units total) by mouth Every 7 days   . furosemide (LASIX) 40 mg Oral Tablet Take 1 Tab (40 mg total) by mouth Once a day   . guaiFENesin (MUCINEX) 600 mg Oral Tablet Extended Release 12hr Take 1 Tab (600 mg total) by mouth Every 12 hours   . HYDROcodone-acetaminophen (NORCO) 10-325 mg Oral Tablet Take 1 Tab by mouth Four times a day    . Ibuprofen (MOTRIN) 800 mg Oral Tablet Take 1 Tab (800 mg total) by mouth Three times a day as needed for Pain   . levothyroxine (SYNTHROID) 25 mcg Oral Tablet Take 25 mcg by mouth Once a day   . lisinopril (PRINIVIL) 2.5 mg Oral Tablet take 1 tablet by mouth once daily   . loratadine (CLARITIN) 10 mg Oral Tablet Take 1 Tab (10 mg total) by mouth Once a day   . loratadine-pseudoephedrine (CLARITIN-D) 5-120 mg Oral Tablet Sustained Release 12 hr Take 1 Tab by mouth Twice daily for 14 days   . metFORMIN (GLUCOPHAGE) 500 mg Oral Tablet Take 1 Tab (500 mg total) by mouth Twice daily with food   . metoprolol succinate (TOPROL-XL) 25 mg Oral Tablet Sustained Release 24 hr TAKE 1/2 TABLET ONCE DAILY   . niacin (NIASPAN) 500 mg Oral Tablet Sustained Release Take 1 Tab (500 mg total) by mouth Once a day   . nitroGLYCERIN (NITROSTAT) 0.4 mg Sublingual Tablet, Sublingual 1 Tab (0.4 mg total) by  Sublingual route Every 5 minutes as needed for Chest pain for 3 doses over 15 minutes   . omeprazole (PRILOSEC) 40 mg Oral Capsule, Delayed Release(E.C.) Take 1 Cap (40 mg total) by mouth Once a day   . ondansetron (ZOFRAN ODT) 8 mg Oral Tablet, Rapid Dissolve Take 1 Tab (8 mg total) by mouth Every 8 hours as needed for nausea/vomiting   . oxymetazoline (AFRIN) 0.05 % Nasal Spray, Non-Aerosol 1 Spray  by Each Nostril route Twice daily for 3 days   . PRENATAL PLUS, CALCIUM CARB, 27 mg iron- 1 mg Oral Tablet Take 1 Tab by mouth Once a day   . PROVENTIL HFA 90 mcg/actuation Inhalation HFA Aerosol Inhaler Take 1-2 Puffs by inhalation Every 4 hours as needed   . pyrilamine-dextromethorphan (CAPRON DM) 7.5-7.5 mg/5 mL Oral Liquid Take 7.5 mL by mouth Three times a day as needed   . rosuvastatin (CRESTOR) 20 mg Oral Tablet Take 1 Tab (20 mg total) by mouth Once a day   . venlafaxine (EFFEXOR XR) 37.5 mg Oral Capsule, Sust. Release 24 hr Take 1 Cap (37.5 mg total) by mouth Once a day   . VENTOLIN HFA 90 mcg/actuation Inhalation HFA Aerosol Inhaler Take 1-2 Puffs by inhalation Every 6 hours as needed     Allergies:  Allergies   Allergen Reactions   . Tylenol W Codeine [Acetaminophen-Codeine]      pt states that one time she had chest pains with this med, but dr. thought it was because she had not eaten     Family history:  Family Medical History:     Problem Relation (Age of Onset)    Atrial fibrillation Sister    Colon Cancer Father    Diabetes Mother    Heart Disease Sister, Maternal Grandfather    Hypertension (High Blood Pressure) Mother    Lung Cancer Maternal Grandfather    MI <46 years of age Mother        Social history:  Social History     Socioeconomic History   . Marital status: Married     Spouse name: [name]   . Number of children: 3   . Years of education: completed 11th grade   . Highest education level: Not on file   Occupational History   . Occupation: disabled   Social Needs   . Financial resource strain: Not  hard at all   . Food insecurity:     Worry: Never true     Inability: Never true   . Transportation needs:     Medical: No     Non-medical: No   Tobacco Use   . Smoking status: Current Every Day Smoker     Packs/day: 0.50     Years: 20.00     Pack years: 10.00     Types: Cigarettes     Start date: 45   . Smokeless tobacco: Never Used   Substance and Sexual Activity   . Alcohol use: No     Frequency: Never     Binge frequency: Never   . Drug use: No   . Sexual activity: Yes     Partners: Male   Lifestyle   . Physical activity:     Days per week: 0 days     Minutes per session: 0 min   . Stress: To some extent   Relationships   . Social connections:     Talks on phone: More than three times a week     Gets together: More than three times a week     Attends religious service: Never     Active member of club or organization: No     Attends meetings of clubs or organizations: Never     Relationship status: Married   . Intimate partner violence:     Fear of current or ex partner: No     Emotionally abused: No     Physically abused: No  Forced sexual activity: No   Other Topics Concern   . Routine Exercise No   . Ability to Walk 2 Flight of Steps without SOB/CP Yes   Social History Narrative    Living situation: lives at home with husband, [name], and daughter.  1 dog named Duke and 1 cat named Lucky.    Nutrition:  Eats 5 small meals d/t gastric sleeve surgery.     Caffeine use: none, decaf coffee    Exercise: stays busy around house. No formal form of exercise    Seatbelt use: sometimes    Fire extinguishers in the home: yes    Smoke alarms in the home: yes    Carbon monoxide detectors in the home: yes    Nancy Fetter exposure: sunglasses        Danaja would like for husband, Yavonne Kiss,  to speak for her in the event she would become incapacitated.    Full code.      Vitals:    12/10/18 1423   BP: 131/85   Pulse: 87   Temp: 36.1 C (97 F)   SpO2: 95%   Weight: 89.8 kg (198 lb)   Height: 1.676 m (5\' 6" )   BMI: 32.02          Physical Examination:  GENERAL: Pt is a pleasant, well-nourished, well-developed 53 y.o. female who is in NAD. Appears stated age  56: +serous effusions and retraction of bilateral TM's, no purulence, erythema, perforation, or bulging. Tenderness to palpation of frontal sinuses. Bilateral inferior turbinate hypertrophy and erythema with septal deviation to the left. Head normocephalic, symmetrical facies. EOM intact b/l. PERRLA. Sclera non-icteric, non-injected. Oropharyngeal mucous membranes are moist w/o erythema/exudates   NECK: supple. No masses, lymphadenopathy, JVD on exam. Trachea midline, no thyromegaly   CV: S1, S2. No murmurs, rubs, gallops.     LUNGS: CTAB, No rhonchi, rales, wheezes.   MSK: Spontaneous normal AROM of major joints as observed during exam. No joint effusions/ swelling/ deformities.   No BLE edema, clubbing, or cyanosis.   Gait normal.  SKIN: No significant lesions, rashes, ecchymoses noted.   NEURO: AAOx4, CN grossly intact. Sensation intact b/l. No focal deficits.  PSYCH: Mood, behavior and affect normal w/ Intact judgement & insight     Visit Diagnosis    Viral URI with cough  Offered reassurance that this is likely viral in etiology.   Start decongestants - Claritin D.   Afrin PRN - discussed risk of dependence and rhinitis medicamentosa and limit use to no more than 3 days.   Encouraged increased clear fluid intake and rest.   Start Mucinex PRN for cough/expectorant.   Complete course of Augmentin.     Orders Placed This Encounter   . oxymetazoline (AFRIN) 0.05 % Nasal Spray, Non-Aerosol   . loratadine-pseudoephedrine (CLARITIN-D) 5-120 mg Oral Tablet Sustained Release 12 hr   . guaiFENesin (MUCINEX) 600 mg Oral Tablet Extended Release 12hr     Return if symptoms worsen or fail to improve.    Chryl Heck, PA-C  12/10/2018, 14:31  Electronically signed by Chryl Heck, PA-C

## 2018-12-10 NOTE — Nursing Note (Signed)
Patient came in and was seen in our office today.      Monique [name]

## 2018-12-10 NOTE — Telephone Encounter (Signed)
Regarding: Dr. Russ Halo  ----- Message from Moses Manners sent at 12/10/2018  1:21 PM EST -----  Patient requesting a call from the nurse.  [facility] and on 3 [facility] but she is not getting any better.      Please call her at:  [phone]

## 2018-12-10 NOTE — Patient Instructions (Signed)
When to Use Antibiotics  Antibiotics are medicines used to treat infections caused by bacteria. They don't work for illnesses caused by viruses or an allergic reaction. In fact, taking antibiotics for reasons other than a bacterial infection can cause problems. For example, you may have side effects from the medicine. And if you really need an antibiotic, it may not work well.  When antibiotics won't help  Your healthcare provider won't usually prescribe antibiotics for the following conditions. You can help by not asking for them if you have:   A cold. This type of illness is caused by a virus. It can cause a runny nose, stuffed-up nose, sneezing, coughing, headache, mild body aches, and low fever. A cold gets better on its own in a few days to a week.   The flu (influenza). This is a respiratory illness caused by a virus. The flu usually goes away on its own in a week or so. It can cause fever, body aches, sore throat, and fatigue.   Bronchitis. This is an infection in the lungs most often caused by a virus. You may have coughing, phlegm, body aches, and a low fever. A common type of bronchitis is known as a chest cold (acute bronchitis). This often happens after you have a respiratory infection like a common cold. Bronchitis can take weeks to go away, but antibiotics usually don't help.   Most sore throats. Sore throats are most often caused by viruses. Your throat may feel scratchy or achy, and it may hurt to swallow. You may also have a low fever and body aches. A sore throat usually gets better in a few days.   Most ear infections. An ear infection may be caused by a virus or bacteria. It causes pain in the ear. Antibiotics usually don't help, and the infection goes away on its own.   Most sinus infections (sinusitis). This kind of infection causes sinus pain and  swelling, and a runny nose. In most cases, sinusitis goes away on its own, and antibiotics don't make recovery quicker.   Allergic rhinitis. This is a set of symptoms caused by an allergic reaction. You may have sneezing, a runny nose, itchy or watery eyes, or a sore throat. Allergies are not treated with antibiotics.   Low fever. A mild fever that's less than 100.4F (38C) most likely doesn't need treatment with antibiotics.  When antibiotics can help  Antibiotics can be used to treat:   Strep throat. This is a throat infectioncaused by a certain type of bacteria. Symptoms of strep throat include a sore throat, white patches on the tonsils, red spots on the roof of the mouth, fever, body aches, and nausea and vomiting.   Urinary tract infection (UTI). This is a bacterial infection of the bladder and the tube that takes urine out of the body. It can cause burning pain and urine that's cloudy or tinted with blood. UTIs are very common. Antibiotics usually help treat these infections.   Some ear infections. In some cases, a healthcare provider may prescribe antibiotics for an ear infection. You may need a test to show what's causing the ear infection.   Some sinus infections. In some cases, yourhealthcare provider may give you antibiotics. He or she may first need to make sure your symptoms aren't caused by a virus, fungus, allergies, or air pollutants such as smoke.  Your doctor may also recommend antibiotics if you have a condition that can affect your immune system, such as diabetes or cancer.    Self-care at home  If your infection can't be treated with antibiotics, you can take other steps to feel better. Try the remedies below. In general:   Rest and sleep as much as needed.   Drink water and other clear fluids.   Don't smoke, and avoid smoke from other people.   Use over-the-counter medicine such as acetaminophen to ease pain or fever, as  directed by your healthcare provider.  To treat sinus pain or nasal congestion:   Put a warm, moist washcloth on your face where you feel sinus pain or pressure.   Use a nasal spray with medicine or saline, as directed by your healthcare provider.   Breathe in steam from a hot shower.   Use a humidifier or cool mist vaporizer.  To quiet a cough:   Use a humidifier or cool mist vaporizer.   Breathe in steam from a hot shower.   Use cough lozenges.  To sooth a sore throat:   Suck on ice chips, popsicles, or lozenges.   Use a sore throat spray.   Use a humidifier or cool mist vaporizer.   Gargle with saltwater.   Drink warm liquids.  To ease ear pain:   Hold a warm, moist washcloth on the ear for 10 minutes at a time.  [employer] last reviewed this educational content on 08/09/2015   2000-2019 [employer] [address], [place] [address]. All rights reserved. This information is not intended as a substitute for professional medical care. Always follow your healthcare professional's instructions.

## 2018-12-14 ENCOUNTER — Other Ambulatory Visit (HOSPITAL_BASED_OUTPATIENT_CLINIC_OR_DEPARTMENT_OTHER): Payer: Self-pay | Admitting: PHYSICIAN ASSISTANT

## 2018-12-14 DIAGNOSIS — E119 Type 2 diabetes mellitus without complications: Secondary | ICD-10-CM

## 2018-12-14 NOTE — Telephone Encounter (Signed)
last visit 1/3 next 1/15    [facility]

## 2018-12-22 ENCOUNTER — Encounter (HOSPITAL_BASED_OUTPATIENT_CLINIC_OR_DEPARTMENT_OTHER): Payer: Self-pay | Admitting: PHYSICIAN ASSISTANT

## 2018-12-22 ENCOUNTER — Ambulatory Visit (INDEPENDENT_AMBULATORY_CARE_PROVIDER_SITE_OTHER): Payer: Commercial Managed Care - PPO | Admitting: PHYSICIAN ASSISTANT

## 2018-12-22 VITALS — BP 122/72 | HR 85 | Temp 97.5°F | Ht 66.0 in | Wt 201.4 lb

## 2018-12-22 DIAGNOSIS — S139XXA Sprain of joints and ligaments of unspecified parts of neck, initial encounter: Secondary | ICD-10-CM

## 2018-12-22 DIAGNOSIS — S139XXS Sprain of joints and ligaments of unspecified parts of neck, sequela: Secondary | ICD-10-CM

## 2018-12-22 DIAGNOSIS — Z6832 Body mass index (BMI) 32.0-32.9, adult: Secondary | ICD-10-CM

## 2018-12-22 DIAGNOSIS — F32A Depression, unspecified: Secondary | ICD-10-CM

## 2018-12-22 DIAGNOSIS — F329 Major depressive disorder, single episode, unspecified: Secondary | ICD-10-CM

## 2018-12-22 MED ORDER — VENLAFAXINE ER 37.5 MG CAPSULE,EXTENDED RELEASE 24 HR
37.5000 mg | ORAL_CAPSULE | Freq: Every day | ORAL | 2 refills | Status: DC
Start: 2018-12-22 — End: 2019-03-01
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
## 2018-12-22 NOTE — Patient Instructions (Signed)
Neck Sprain or Strain  A sudden force that causes turning or bending of the neck can cause sprain or strain. An example would be the force from a car accident. This can stretch or tear muscles called a strain. It can also stretch or tear ligaments called a sprain. Either of these can cause neck pain. Sometimes neck pain occurs after a simple awkward movement. In either case, muscle spasm is commonly present and contributes to the pain.  Unless you had a forceful physical injury (for example, a car accident or fall), X-rays are often not ordered for the initial evaluation of neck pain. If pain continues and does not respond to medical treatment, X-rays and other tests may be done later.  Home care   You may feel more soreness and spasm the first few days after the injury. Rest until symptoms start to improve.   When lying down, use a comfortable pillow or a rolled towel that supports the head and keeps the spine in a neutral position. The position of the head should not be tilted forward or backward.   Apply an ice pack over the injured area for 15 to 20 minutes every 3 to 6 hours. Do this for the first 24 to 48 hours. You can make an ice pack by filling a plastic bag that seals at the top with ice cubes and then wrapping it with a thin towel. After 48 hours, apply heat (warm shower or warm bath) for 15 to 20 minutes several times a day, or alternate ice and heat.   You may use over-the-counter pain medicine to control pain, unless another pain medicine was prescribed. If you have chronic liver or kidney disease or ever had a stomach ulcer or gastrointestinal bleeding, talk with your healthcare provider before using these medicines.   If a soft cervical collar was prescribed, only ear it for periods of increased pain. It should not be worn for more than 3 hours a day, or for longer than 1 to 2 weeks.  Follow-up care  Follow up with your healthcare provider, or as directed. Physical therapy may be  needed.  Sometimes fractures don't show up on the first X-ray. Bruises and sprains can sometimes hurt as much as a fracture. These injuries can take time to heal completely. If your symptoms don't improve or they get worse, talk with your healthcare provider. You may need a repeat X-ray or other tests. If X-rays were taken, you will be told of any new findings that may affect your care.  Call 911  Call 911 if you have:   Neck swelling, difficulty or painful swallowing   Trouble breathing   Chest pain  When to seek medical advice  Call your healthcare provider right away if any of these occur:   Pain becomes worse or spreads into your arms or legs   Weakness or numbness in one or both arms or legs  [employer] last reviewed this educational content on 04/07/2017   2000-2019 [employer]. [address], [place] [address]. All rights reserved. This information is not intended as a substitute for professional medical care. Always follow your healthcare professional's instructions.

## 2018-12-31 ENCOUNTER — Ambulatory Visit (INDEPENDENT_AMBULATORY_CARE_PROVIDER_SITE_OTHER): Payer: Commercial Managed Care - PPO | Admitting: Family Medicine

## 2018-12-31 ENCOUNTER — Encounter (INDEPENDENT_AMBULATORY_CARE_PROVIDER_SITE_OTHER): Payer: Self-pay

## 2018-12-31 VITALS — BP 130/80 | HR 91 | Temp 97.5°F | Resp 20 | Ht 66.0 in | Wt 196.0 lb

## 2018-12-31 DIAGNOSIS — K529 Noninfective gastroenteritis and colitis, unspecified: Principal | ICD-10-CM

## 2018-12-31 DIAGNOSIS — Z6831 Body mass index (BMI) 31.0-31.9, adult: Secondary | ICD-10-CM

## 2018-12-31 MED ORDER — FAMOTIDINE 20 MG TABLET
40.0000 mg | ORAL_TABLET | Freq: Every evening | ORAL | 0 refills | Status: AC
Start: 2018-12-31 — End: 2019-01-07

## 2018-12-31 MED ORDER — FAMOTIDINE 20 MG TABLET
40.0000 mg | ORAL_TABLET | Freq: Two times a day (BID) | ORAL | 0 refills | Status: DC
Start: 2018-12-31 — End: 2018-12-31

## 2018-12-31 NOTE — Progress Notes (Signed)
Department of Community Practice     Patient Name: Monique [name]  Date: 12/31/2018  [place]  [address]  [place] [address]  [phone]  MRN: [id number]  DOB: [birth year]    Chief complaint: Vomiting (vomiting x 3 days, had diarrhea, now hard stool).    HPI:  The patient is a 53 y.o. old female who presents with the above symptoms.  Past Surg Hx significant for gastric sleeve surgery.  When asked about her "hard stool" she states that it is not significantly firmer than usual.  No constipation.  She is on Zofran on a chronic basis.  Presently not treating with additional medications.      ROS:    Constitutional:  No fever or chills  Skin:  No rashes or lesions  HENT:  No sore throat, otalgia, or dysphagia  Eyes:  No vision changes, redness, or discharge  CV:  No chest pain or palpitations  Respiratory:  No cough, wheezing, or shortness of breath  GI:  See HPI.  GU:  No dysuria, hematuria, frequency, nocturia, or flank pain  Musculoskeletal:  No joint, neck, or back pain  Neuro:  No numbness, tingling, weakness, or headaches    Past medical history:  Past Medical History:   Diagnosis Date   . Anxiety    . Carotid stenosis    . Chronic back pain    . Coronary artery disease    . DDD (degenerative disc disease), lumbar     "entire spine"   . Ear piercing    . GERD (gastroesophageal reflux disease)    . H/O complete eye exam     2 years, Dr. [name], at [facility]   . History of dental examination     dentures upper and lower   . HTN    . Hyperlipidemia    . MI (myocardial infarction) (CMS [place]) 2012   . Neck problem     herniated cervical disc   . Obesity (BMI 30-39.9)    . 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 [place] ordered.    . Stroke (CMS [facility])     2002   . Tattoo     Left breast, Right shoulder   . Type 2 diabetes mellitus (CMS [facility])          Past surgical history:  Past Surgical History:   Procedure Laterality Date      . ENDOMETRIAL ABLATION  1998   . HX ANKLE FRACTURE TX  1990s    Right, Doran Durand procedure   . HX CATARACT REMOVAL  2005    r and l eyes with implants   . HX CESAREAN SECTION  06/20/2000    x3, 11/21/86, 11/10/84   . HX CHOLECYSTECTOMY  1988   . HX CORONARY ARTERY BYPASS GRAFT  03/31/2011    cardiac, 2 vessel, Dr. [name], [facility]   . HX CORONARY STENT PLACEMENT  2012    x4 stents   . HX GASTRIC SLEEVE  02/2017    [place], [place], Dr. Pearlie Oyster   . HX HEART CATHETERIZATION  2012    massive heart attack and stents - [facility]   . HX THYROID BIOPSY     . HX THYROIDECTOMY  101/01/2018   . HX TONSILLECTOMY      as child         Medications:  Current Outpatient Medications   Medication Sig   .  ACCU-CHEK AVIVA PLUS TEST STRP Strip 1 Strip by Subcutaneous route Three times a day as needed   . BYDUREON 2 mg Subcutaneous 2 mg by Subcutaneous route Every 7 days   . clopidogrel (PLAVIX) 75 mg Oral Tablet Take 1 Tab (75 mg total) by mouth Once a day   . cyanocobalamin (VITAMIN B12) 1,000 mcg/mL Injection Solution 1 mL (1,000 mcg total) by Subcutaneous route Every 30 days   . diclofenac sodium (VOLTAREN) 1 % Gel 2 g by Apply Topically route Three times a day as needed   . docusate sodium (COLACE) 100 mg Oral Capsule Take 1 Cap (100 mg total) by mouth Twice daily   . ergocalciferol, vitamin D2, (DRISDOL) 50,000 unit Oral Capsule Take 1 Cap (50,000 Units total) by mouth Every 7 days   . famotidine (PEPCID) 20 mg Oral Tablet Take 2 Tabs (40 mg total) by mouth Every evening for 7 days   . furosemide (LASIX) 40 mg Oral Tablet Take 1 Tab (40 mg total) by mouth Once a day   . GLYXAMBI 10-5 mg Oral Tablet TAKE 1 TABLET BY MOUTH ONCE DAILY   . guaiFENesin (MUCINEX) 600 mg Oral Tablet Extended Release 12hr Take 1 Tab (600 mg total) by mouth Every 12 hours   . HYDROcodone-acetaminophen (NORCO) 10-325 mg Oral Tablet Take 1 Tab by mouth Four times a day    . Ibuprofen (MOTRIN) 800 mg Oral Tablet Take 1 Tab (800 mg total) by mouth Three times  a day as needed for Pain   . levothyroxine (SYNTHROID) 25 mcg Oral Tablet Take 25 mcg by mouth Once a day   . lisinopril (PRINIVIL) 2.5 mg Oral Tablet take 1 tablet by mouth once daily   . loratadine (CLARITIN) 10 mg Oral Tablet Take 1 Tab (10 mg total) by mouth Once a day   . metFORMIN (GLUCOPHAGE) 500 mg Oral Tablet Take 1 Tab (500 mg total) by mouth Twice daily with food   . metoprolol succinate (TOPROL-XL) 25 mg Oral Tablet Sustained Release 24 hr TAKE 1/2 TABLET ONCE DAILY   . niacin (NIASPAN) 500 mg Oral Tablet Sustained Release Take 1 Tab (500 mg total) by mouth Once a day   . nitroGLYCERIN (NITROSTAT) 0.4 mg Sublingual Tablet, Sublingual 1 Tab (0.4 mg total) by Sublingual route Every 5 minutes as needed for Chest pain for 3 doses over 15 minutes   . omeprazole (PRILOSEC) 40 mg Oral Capsule, Delayed Release(E.C.) Take 1 Cap (40 mg total) by mouth Once a day   . ondansetron (ZOFRAN ODT) 8 mg Oral Tablet, Rapid Dissolve Take 1 Tab (8 mg total) by mouth Every 8 hours as needed for nausea/vomiting   . PRENATAL PLUS, CALCIUM CARB, 27 mg iron- 1 mg Oral Tablet Take 1 Tab by mouth Once a day   . PROVENTIL HFA 90 mcg/actuation Inhalation HFA Aerosol Inhaler Take 1-2 Puffs by inhalation Every 4 hours as needed   . pyrilamine-dextromethorphan (CAPRON DM) 7.5-7.5 mg/5 mL Oral Liquid Take 7.5 mL by mouth Three times a day as needed (Patient not taking: Reported on 12/31/2018)   . rosuvastatin (CRESTOR) 20 mg Oral Tablet Take 1 Tab (20 mg total) by mouth Once a day   . venlafaxine (EFFEXOR XR) 37.5 mg Oral Capsule, Sust. Release 24 hr Take 1 Cap (37.5 mg total) by mouth Once a day   . VENTOLIN HFA 90 mcg/actuation Inhalation HFA Aerosol Inhaler Take 1-2 Puffs by inhalation Every 6 hours as needed (Patient not taking: Reported on 12/31/2018)  Allergies:  Allergies   Allergen Reactions   . Ane Payment [Pyrilamine-Dextromethorphan]    . Tylenol W Codeine [Acetaminophen-Codeine]      pt states that one time she had chest pains  with this med, but dr. thought it was because she had not eaten     Family history:  Family Medical History:     Problem Relation (Age of Onset)    Atrial fibrillation Sister    Colon Cancer Father    Diabetes Mother    Heart Disease Sister, Maternal Grandfather    Hypertension (High Blood Pressure) Mother    Lung Cancer Maternal Grandfather    MI <49 years of age Mother            Social history:  Social History     Socioeconomic History   . Marital status: Married     Spouse name: [name]   . Number of children: 3   . Years of education: completed 11th grade   . Highest education level: Not on file   Occupational History   . Occupation: disabled   Social Needs   . Financial resource strain: Not hard at all   . Food insecurity:     Worry: Never true     Inability: Never true   . Transportation needs:     Medical: No     Non-medical: No   Tobacco Use   . Smoking status: Current Every Day Smoker     Packs/day: 0.50     Years: 20.00     Pack years: 10.00     Types: Cigarettes     Start date: 44   . Smokeless tobacco: Never Used   Substance and Sexual Activity   . Alcohol use: No     Frequency: Never     Binge frequency: Never   . Drug use: No   . Sexual activity: Yes     Partners: Male   Lifestyle   . Physical activity:     Days per week: 0 days     Minutes per session: 0 min   . Stress: To some extent   Relationships   . Social connections:     Talks on phone: More than three times a week     Gets together: More than three times a week     Attends religious service: Never     Active member of club or organization: No     Attends meetings of clubs or organizations: Never     Relationship status: Married   . Intimate partner violence:     Fear of current or ex partner: No     Emotionally abused: No     Physically abused: No     Forced sexual activity: No   Other Topics Concern   . Routine Exercise No   . Ability to Walk 2 Flight of Steps without SOB/CP Yes   Social History Narrative    Living situation: lives at home  with husband, [name], and daughter.  1 dog named Duke and 1 cat named Lucky.    Nutrition:  Eats 5 small meals d/t gastric sleeve surgery.     Caffeine use: none, decaf coffee    Exercise: stays busy around house. No formal form of exercise    Seatbelt use: sometimes    Fire extinguishers in the home: yes    Smoke alarms in the home: yes    Carbon monoxide detectors in the home: yes    Nancy Fetter exposure:  sunglasses        Hser would like for husband, Nieshia Larmon,  to speak for her in the event she would become incapacitated.    Full code.            Physical Exam:    Vital signs:   Vitals:    12/31/18 1548   BP: 130/80   Pulse: 91   Resp: 20   Temp: 36.4 C (97.5 F)   SpO2: 97%   Weight: 88.9 kg (196 lb)   Height: 1.676 m (5\' 6" )   BMI: 31.7         Body mass index is 31.64 kg/m.    Physical Examination:  General: Well-appearing. No apparent distress.  Eyes: Normal lids and lashes. Normal conjunctiva.   ENT: Ears: EAC's normal, TM's clear. Nose: Normal  Throat: Clear  Neck:  Supple, no cervical lymphadenopathy  Cardiovascular: Regular rate and rhythm. Normal S1 and S2. No murmurs rubs or gallops were auscultated.  Lungs: clear without wheezes or rhonchi.  Abdomen: Soft, bowel sounds normal, no rebound.   Skin: Warm and dry. Good skin turgor. No rash is seen.                Visit Diagnosis  Gastroenteritis    Plan  Orders Placed This Encounter   . famotidine (PEPCID) 20 mg Oral Tablet   Also may tried ginger ale for nausea.  Follow-up with PCP  Recheck here as needed    Plan was discussed and patient verbalized understanding.  If symptoms are worsening or not improving the patient should return to Urgent Care for further evaluation, contact their PCP, or go to the Emergency Department for further evaluation.    Ambrose Mantle, MD  12/31/2018, 16:32  Electronically signed by Ambrose Mantle, MD    This note was partially generated using MModal Fluency Direct system, and there may be some incorrect words, spellings, and  punctuation that were not noted in checking the note before saving.

## 2018-12-31 NOTE — Patient Instructions (Signed)
Pepcid as directed.  Continue Zofran  Avoid any foods that are fatty/spicy/greasy.  Follow up with PCP, recheck here as needed.

## 2019-01-12 ENCOUNTER — Ambulatory Visit (INDEPENDENT_AMBULATORY_CARE_PROVIDER_SITE_OTHER): Payer: Commercial Managed Care - PPO | Admitting: Ophthalmology

## 2019-01-12 ENCOUNTER — Encounter (INDEPENDENT_AMBULATORY_CARE_PROVIDER_SITE_OTHER): Payer: Self-pay | Admitting: Ophthalmology

## 2019-01-12 ENCOUNTER — Other Ambulatory Visit: Payer: Self-pay

## 2019-01-12 VITALS — BP 132/72 | HR 68 | Ht 66.0 in | Wt 196.0 lb

## 2019-01-12 DIAGNOSIS — H0264 Xanthelasma of left upper eyelid: Secondary | ICD-10-CM

## 2019-01-12 DIAGNOSIS — H26491 Other secondary cataract, right eye: Secondary | ICD-10-CM

## 2019-01-12 DIAGNOSIS — E103293 Type 1 diabetes mellitus with mild nonproliferative diabetic retinopathy without macular edema, bilateral: Secondary | ICD-10-CM

## 2019-01-12 DIAGNOSIS — Z961 Presence of intraocular lens: Secondary | ICD-10-CM

## 2019-01-12 DIAGNOSIS — H0261 Xanthelasma of right upper eyelid: Secondary | ICD-10-CM

## 2019-01-12 NOTE — Progress Notes (Addendum)
[facility]  [address] [place]  [place] [address]    Patient Name: Monique [name]  MRN#: [id number]  Birthdate: 11/21/1966    Date of Service: 01/12/2019    Chief Complaint     Blurred Vision; Diabetes          Venba Zenner is a 53 y.o. female who presents today for evaluation/consultation of:  HPI     Blurred Vision     Laterality: right eye    Onset: gradual    Quality: blurred    Severity: moderate    Onset: years ago    Frequency: constantly    Timing: throughout the day    Course: gradually worsening    Associated symptoms: Negative for double vision, eye pain, trauma, headache, shoulder/hip pain, fatigue, haloes, dryness, tearing, abnormal color vision, jaw claudication, weight loss, glare, a need for brighter lights, redness, pain with eye movement, scalp tenderness and fever    Response to treatment: no improvement              Comments     Monique [name] HERE WEARING READERS ONLY WITH C/O GRADUAL WORSENING BLURRY VA OD X FEW YRS; NO C/O VA OS IT SEEMS CLEAR; NEGATIVE FLASHES OF LIGHT FLOATERS PAIN TEARING REDNESS OU          Last edited by Lucien Mons, MA on 01/12/2019 12:45 PM. (History)        ROS     Positive for: Gastrointestinal (constipation  gerd), Neurological (stroke), Musculoskeletal (back pain), HENT (dry mouth), Cardiovascular (chest pain   artery disease), Psychiatric (anxiety)    Negative for: Constitutional, Skin, Genitourinary, Endocrine, Eyes, Respiratory, Allergic/Imm, Heme/Lymph    Last edited by Lucien Mons, MA on 01/12/2019 12:37 PM. (History)             Past Medical History  Patient Active Problem List   Diagnosis   . Hypercholesterolemia   . Xanthelasma   . S/P CABG x 2   . DDD (degenerative disc disease), lumbar   . Anxiety   . Chronic back pain   . Carotid stenosis   . BMI 35.0-35.9,adult   . H/O complete eye exam   . History of dental examination   . Chest pain   . Constipation   . Thyroid nodule   . Morbid obesity (CMS [place])   . Type 2 diabetes mellitus (CMS [facility])      . Coronary artery disease   . GERD (gastroesophageal reflux disease)   . HTN (hypertension)   . Hyperlipidemia   . MI (myocardial infarction) (CMS [place])   . Obesity (BMI 30-39.9)   . Stroke (CMS [facility])   . Cough   . Sinus congestion     Current Outpatient Medications   Medication Sig   . ACCU-CHEK AVIVA PLUS TEST STRP Strip 1 Strip by Subcutaneous route Three times a day as needed   . BYDUREON 2 mg Subcutaneous 2 mg by Subcutaneous route Every 7 days   . clopidogrel (PLAVIX) 75 mg Oral Tablet Take 1 Tab (75 mg total) by mouth Once a day   . cyanocobalamin (VITAMIN B12) 1,000 mcg/mL Injection Solution 1 mL (1,000 mcg total) by Subcutaneous route Every 30 days   . diclofenac sodium (VOLTAREN) 1 % Gel 2 g by Apply Topically route Three times a day as needed   . docusate sodium (COLACE) 100 mg Oral Capsule Take 1 Cap (100 mg total) by mouth Twice daily   . ergocalciferol, vitamin D2, (  DRISDOL) 50,000 unit Oral Capsule Take 1 Cap (50,000 Units total) by mouth Every 7 days   . furosemide (LASIX) 40 mg Oral Tablet Take 1 Tab (40 mg total) by mouth Once a day   . GLYXAMBI 10-5 mg Oral Tablet TAKE 1 TABLET BY MOUTH ONCE DAILY   . guaiFENesin (MUCINEX) 600 mg Oral Tablet Extended Release 12hr Take 1 Tab (600 mg total) by mouth Every 12 hours   . HYDROcodone-acetaminophen (NORCO) 10-325 mg Oral Tablet Take 1 Tab by mouth Four times a day    . Ibuprofen (MOTRIN) 800 mg Oral Tablet Take 1 Tab (800 mg total) by mouth Three times a day as needed for Pain   . levothyroxine (SYNTHROID) 25 mcg Oral Tablet Take 25 mcg by mouth Once a day   . lisinopril (PRINIVIL) 2.5 mg Oral Tablet take 1 tablet by mouth once daily   . loratadine (CLARITIN) 10 mg Oral Tablet Take 1 Tab (10 mg total) by mouth Once a day   . metFORMIN (GLUCOPHAGE) 500 mg Oral Tablet Take 1 Tab (500 mg total) by mouth Twice daily with food   . metoprolol succinate (TOPROL-XL) 25 mg Oral Tablet Sustained Release 24 hr TAKE 1/2 TABLET ONCE DAILY   . niacin (NIASPAN) 500 mg  Oral Tablet Sustained Release Take 1 Tab (500 mg total) by mouth Once a day   . nitroGLYCERIN (NITROSTAT) 0.4 mg Sublingual Tablet, Sublingual 1 Tab (0.4 mg total) by Sublingual route Every 5 minutes as needed for Chest pain for 3 doses over 15 minutes   . omeprazole (PRILOSEC) 40 mg Oral Capsule, Delayed Release(E.C.) Take 1 Cap (40 mg total) by mouth Once a day   . ondansetron (ZOFRAN ODT) 8 mg Oral Tablet, Rapid Dissolve Take 1 Tab (8 mg total) by mouth Every 8 hours as needed for nausea/vomiting   . PRENATAL PLUS, CALCIUM CARB, 27 mg iron- 1 mg Oral Tablet Take 1 Tab by mouth Once a day   . PROVENTIL HFA 90 mcg/actuation Inhalation HFA Aerosol Inhaler Take 1-2 Puffs by inhalation Every 4 hours as needed   . pyrilamine-dextromethorphan (CAPRON DM) 7.5-7.5 mg/5 mL Oral Liquid Take 7.5 mL by mouth Three times a day as needed (Patient not taking: Reported on 12/31/2018)   . rosuvastatin (CRESTOR) 20 mg Oral Tablet Take 1 Tab (20 mg total) by mouth Once a day   . venlafaxine (EFFEXOR XR) 37.5 mg Oral Capsule, Sust. Release 24 hr Take 1 Cap (37.5 mg total) by mouth Once a day   . VENTOLIN HFA 90 mcg/actuation Inhalation HFA Aerosol Inhaler Take 1-2 Puffs by inhalation Every 6 hours as needed (Patient not taking: Reported on 12/31/2018)     Allergies   Allergen Reactions   . Ane Payment [Pyrilamine-Dextromethorphan]  Other Adverse Reaction (Add comment)     LIGHT HEADED   . Tylenol W Codeine [Acetaminophen-Codeine]      Monique [name] states that one time she had chest pains with this med, but dr. thought it was because she had not eaten       Family Medical History:     Problem Relation (Age of Onset)    Atrial fibrillation Sister    Colon Cancer Father    Diabetes Mother, Maternal Aunt    Heart Disease Sister, Maternal Grandfather    Hypertension (High Blood Pressure) Mother    Lung Cancer Maternal Grandfather    MI <16 years of age Mother  Social History     Tobacco Use   . Smoking status: Current Every Day Smoker      Packs/day: 0.50     Years: 20.00     Pack years: 10.00     Types: Cigarettes     Start date: 52   . Smokeless tobacco: Never Used   Substance Use Topics   . Alcohol use: No     Frequency: Never     Binge frequency: Never   . Drug use: No       Past Surgical History:   Procedure Laterality Date   . ENDOMETRIAL ABLATION  1998   . HX ANKLE FRACTURE TX  1990s    Right, Doran Durand procedure   . HX CATARACT REMOVAL Bilateral 2013    r and l eyes with implants   . HX CESAREAN SECTION  06/20/2000    x3, 11/21/86, 11/10/84   . HX CHOLECYSTECTOMY  1988   . HX CORONARY ARTERY BYPASS GRAFT  03/31/2011    cardiac, 2 vessel, Dr. [name], [facility]   . HX CORONARY STENT PLACEMENT  2012    x4 stents   . HX GASTRIC SLEEVE  02/2017    [place], [place], Dr. Pearlie Oyster   . HX HEART CATHETERIZATION  2012    [facility] heart [facility] stents - [facility]   . HX THYROID BIOPSY     . HX THYROIDECTOMY  101/01/2018   . HX TONSILLECTOMY      as child   . HX YAG Left 09/20/2013                  Assessment     ENCOUNTER DIAGNOSES     ICD-10-CM   1. Right posterior capsular opacification H26.491   2. 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 Xanthelasma of right upper eyelid H02.61   4. Pseudophakia Z96.1   5. Xanthelasma of left upper eyelid H02.64       Photos taken  Dm with mild npdr ou  Vis sig pco od; Laser YAG capsulotomy was recommended to the patient; the procedure with the risks benefits and alternatives were discussed at length.   Xanthelasma upper lids ou; stable    Plan:  Diabetes with its effects on vision has been discussed with the patient. The patient is encouraged to keep tight blood sugar control.The physician who manages the  patient's diabetic care will have access to the findings of the dilated fundiscopic exam.   LASER YAG CAPSULOTOMY HAS BEEN SCHEDULED IN THE right EYE.           Cecilie Lowers II, MD 01/12/2019, 15:01    I have seen  and examined the above patient. I discussed the above diagnoses listed in the assessment and the above ophthalmic plan of care with the patient and patient's family. All questions were answered. I reviewed and, when necessary, made changes to the technician/resident note, documented ophthalmology exam, chief complaint, history of present illness, allergies, review of systems, past medical, past surgical, family and social history. I personally reviewed and interpreted all testing and/or imaging performed at this visit. Any exceptions/additions are edited/noted in the relevant encounter fields.

## 2019-01-18 ENCOUNTER — Other Ambulatory Visit (HOSPITAL_BASED_OUTPATIENT_CLINIC_OR_DEPARTMENT_OTHER): Payer: Self-pay | Admitting: Family Medicine

## 2019-01-18 DIAGNOSIS — E118 Type 2 diabetes mellitus with unspecified complications: Secondary | ICD-10-CM

## 2019-01-25 ENCOUNTER — Ambulatory Visit (INDEPENDENT_AMBULATORY_CARE_PROVIDER_SITE_OTHER): Payer: Commercial Managed Care - PPO | Admitting: Ophthalmology

## 2019-01-25 ENCOUNTER — Other Ambulatory Visit: Payer: Self-pay

## 2019-01-25 ENCOUNTER — Encounter (INDEPENDENT_AMBULATORY_CARE_PROVIDER_SITE_OTHER): Payer: Self-pay | Admitting: Ophthalmology

## 2019-01-25 VITALS — Ht 66.0 in | Wt 196.0 lb

## 2019-01-25 DIAGNOSIS — H26491 Other secondary cataract, right eye: Secondary | ICD-10-CM

## 2019-01-25 DIAGNOSIS — Z961 Presence of intraocular lens: Secondary | ICD-10-CM

## 2019-01-25 DIAGNOSIS — E103293 Type 1 diabetes mellitus with mild nonproliferative diabetic retinopathy without macular edema, bilateral: Secondary | ICD-10-CM

## 2019-01-25 DIAGNOSIS — H0261 Xanthelasma of right upper eyelid: Secondary | ICD-10-CM

## 2019-01-25 NOTE — Progress Notes (Signed)
Monique [name]  [address] [place]  [place] [address]    Patient Name: Monique [name]  MRN#: [id number]  Birthdate: Oct 12, 1966    Date of Service: 01/25/2019    Chief Complaint     Secondary Cataract           Monique [name] is a 53 y.o. female who presents today for evaluation/consultation of:  HPI     Secondary Cataract      Laterality: right eye    Associated symptoms: blurred vision    Severity: moderate    Onset: gradual    Duration: years    Frequency: constant    Context: distance vision and near vision    Course: gradually worsening    Activities affected: daily activities              Comments     PT HERE WEARING READERS ONLY WITH C/O GRADUAL WORSENING BLURRY VA OD X FEW YRS WITH NO IMPROVEMENT;  NEGATIVE FLASHES OF LIGHT FLOATERS PAIN TEARING REDNESS OU WISHES TO PROCEED WITH LASER SX          Last edited by Deborah Chalk, MA on 01/25/2019  1:57 PM. (History)        ROS     Positive for: Gastrointestinal (constipation  gerd), Neurological (stroke), Musculoskeletal (back pain), HENT (dry mouth), Cardiovascular (chest pain   artery disease), Psychiatric (anxiety)    Negative for: Constitutional, Skin, Genitourinary, Endocrine, Eyes, Respiratory, Allergic/Imm, Heme/Lymph    Last edited by Deborah Chalk, MA on 01/25/2019  1:57 PM. (History)             Past Medical History  Patient Active Problem List   Diagnosis   . Hypercholesterolemia   . Xanthelasma   . S/P CABG x 2   . DDD (degenerative disc disease), lumbar   . Anxiety   . Chronic back pain   . Carotid stenosis   . BMI 35.0-35.9,adult   . H/O complete eye exam   . History of dental examination   . Chest pain   . Constipation   . Thyroid nodule   . Morbid obesity (CMS [place])   . Type 2 diabetes mellitus (CMS [facility])   . Coronary artery disease   . GERD (gastroesophageal reflux disease)   . HTN (hypertension)   . Hyperlipidemia   . MI (myocardial infarction) (CMS [place])   . Obesity (BMI 30-39.9)   . Stroke (CMS [facility])   . Cough   .  Sinus congestion     Current Outpatient Medications   Medication Sig   . ACCU-CHEK AVIVA PLUS TEST STRP Strip 1 Strip by Subcutaneous route Three times a day as needed   . BYDUREON 2 mg Subcutaneous 2 mg by Subcutaneous route Every 7 days   . clopidogrel (PLAVIX) 75 mg Oral Tablet Take 1 Tab (75 mg total) by mouth Once a day   . cyanocobalamin (VITAMIN B12) 1,000 mcg/mL Injection Solution 1 mL (1,000 mcg total) by Subcutaneous route Every 30 days   . diclofenac sodium (VOLTAREN) 1 % Gel 2 g by Apply Topically route Three times a day as needed   . docusate sodium (COLACE) 100 mg Oral Capsule Take 1 Cap (100 mg total) by mouth Twice daily   . ergocalciferol, vitamin D2, (DRISDOL) 50,000 unit Oral Capsule Take 1 Cap (50,000 Units total) by mouth Every 7 days   . furosemide (LASIX) 40 mg Oral Tablet Take 1 Tab (40 mg total) by mouth Once a  day   . GLYXAMBI 10-5 mg Oral Tablet TAKE 1 TABLET BY MOUTH ONCE DAILY   . guaiFENesin (MUCINEX) 600 mg Oral Tablet Extended Release 12hr Take 1 Tab (600 mg total) by mouth Every 12 hours   . HYDROcodone-acetaminophen (NORCO) 10-325 mg Oral Tablet Take 1 Tab by mouth Four times a day    . Ibuprofen (MOTRIN) 800 mg Oral Tablet Take 1 Tab (800 mg total) by mouth Three times a day as needed for Pain   . levothyroxine (SYNTHROID) 25 mcg Oral Tablet Take 25 mcg by mouth Once a day   . lisinopril (PRINIVIL) 2.5 mg Oral Tablet take 1 tablet by mouth once daily   . loratadine (CLARITIN) 10 mg Oral Tablet Take 1 Tab (10 mg total) by mouth Once a day   . metFORMIN (GLUCOPHAGE) 500 mg Oral Tablet take 1 tablet by mouth twice a day with food   . metoprolol succinate (TOPROL-XL) 25 mg Oral Tablet Sustained Release 24 hr TAKE 1/2 TABLET ONCE DAILY   . NARCAN 4 mg/actuation Nasal Spray, Non-Aerosol PT STATED SHE HAS NARCAN AT HOME BUT IS NOT TAKING IT   . niacin (NIASPAN) 500 mg Oral Tablet Sustained Release Take 1 Tab (500 mg total) by mouth Once a day   . nitroGLYCERIN (NITROSTAT) 0.4 mg  Sublingual Tablet, Sublingual 1 Tab (0.4 mg total) by Sublingual route Every 5 minutes as needed for Chest pain for 3 doses over 15 minutes   . omeprazole (PRILOSEC) 40 mg Oral Capsule, Delayed Release(E.C.) Take 1 Cap (40 mg total) by mouth Once a day   . ondansetron (ZOFRAN ODT) 8 mg Oral Tablet, Rapid Dissolve Take 1 Tab (8 mg total) by mouth Every 8 hours as needed for nausea/vomiting   . PRENATAL PLUS, CALCIUM CARB, 27 mg iron- 1 mg Oral Tablet Take 1 Tab by mouth Once a day   . PROVENTIL HFA 90 mcg/actuation Inhalation HFA Aerosol Inhaler Take 1-2 Puffs by inhalation Every 4 hours as needed   . pyrilamine-dextromethorphan (CAPRON DM) 7.5-7.5 mg/5 mL Oral Liquid Take 7.5 mL by mouth Three times a day as needed (Patient not taking: Reported on 12/31/2018)   . rosuvastatin (CRESTOR) 20 mg Oral Tablet Take 1 Tab (20 mg total) by mouth Once a day   . venlafaxine (EFFEXOR XR) 37.5 mg Oral Capsule, Sust. Release 24 hr Take 1 Cap (37.5 mg total) by mouth Once a day   . VENTOLIN HFA 90 mcg/actuation Inhalation HFA Aerosol Inhaler Take 1-2 Puffs by inhalation Every 6 hours as needed (Patient not taking: Reported on 12/31/2018)     Allergies   Allergen Reactions   . Ane Payment [Pyrilamine-Dextromethorphan]  Other Adverse Reaction (Add comment)     LIGHT HEADED   . Tylenol W Codeine [Acetaminophen-Codeine]  Other Adverse Reaction (Add comment)     pt states that one time she had chest pains with this med, but dr. thought it was because she had not eaten       Family Medical History:     Problem Relation (Age of Onset)    Atrial fibrillation Sister    Colon Cancer Father    Diabetes Mother, Maternal Aunt    Heart Disease Sister, Maternal Grandfather    Hypertension (High Blood Pressure) Mother    Lung Cancer Maternal Grandfather    MI <83 years of age Mother              Social History     Tobacco Use   .  Smoking status: Current Every Day Smoker     Packs/day: 0.50     Years: 20.00     Pack years: 10.00     Types: Cigarettes       Start date: 66   . Smokeless tobacco: Never Used   Substance Use Topics   . Alcohol use: No     Frequency: Never     Binge frequency: Never   . Drug use: No       Past Surgical History:   Procedure Laterality Date   . ENDOMETRIAL ABLATION  1998   . HX ANKLE FRACTURE TX  1990s    Right, Doran Durand procedure   . HX CATARACT REMOVAL Bilateral 2013    r and l eyes with implants   . HX CESAREAN SECTION  06/20/2000    x3, 11/21/86, 11/10/84   . HX CHOLECYSTECTOMY  1988   . HX CORONARY ARTERY BYPASS GRAFT  03/31/2011    cardiac, 2 vessel, Dr. [name], [facility]   . HX CORONARY STENT PLACEMENT  2012    x4 stents   . HX GASTRIC SLEEVE  02/2017    [place], [place], Dr. Pearlie Oyster   . HX HEART CATHETERIZATION  2012    massive heart attack and stents - [facility]   . HX THYROID BIOPSY     . HX THYROIDECTOMY  101/01/2018   . HX TONSILLECTOMY      as child   . HX YAG Left 09/20/2013                  Assessment     ENCOUNTER DIAGNOSES     ICD-10-CM   1. Right posterior capsular opacification H26.491   2. 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 Xanthelasma of right upper eyelid H02.61   4. Pseudophakia Z96.1       Assessment:  VIS SIG PCO (RIGHT ; LASER TREATMENT WAS PERFORMED WITHOUT DIFFICULTY.    Plan:  USE KETOROLAC DROPS ( gray top) IN THE TREATED EYE 4 TIMES A DAY.  RETURN IN 1 WEEK.       Cecilie Lowers II, MD 01/25/2019, 15:10    I have seen and examined the above patient. I discussed the above diagnoses listed in the assessment and the above ophthalmic plan of care with the patient and patient's family. All questions were answered. I reviewed and, when necessary, made changes to the technician/resident note, documented ophthalmology exam, chief complaint, history of present illness, allergies, review of systems, past medical, past surgical, family and social history. I personally reviewed and interpreted all testing and/or  imaging performed at this visit. Any exceptions/additions are edited/noted in the relevant encounter fields.

## 2019-01-26 ENCOUNTER — Ambulatory Visit (HOSPITAL_BASED_OUTPATIENT_CLINIC_OR_DEPARTMENT_OTHER): Payer: Commercial Managed Care - PPO

## 2019-01-28 ENCOUNTER — Other Ambulatory Visit: Payer: Self-pay

## 2019-01-28 ENCOUNTER — Ambulatory Visit (INDEPENDENT_AMBULATORY_CARE_PROVIDER_SITE_OTHER): Payer: Commercial Managed Care - PPO

## 2019-01-28 DIAGNOSIS — E538 Deficiency of other specified B group vitamins: Principal | ICD-10-CM

## 2019-01-28 NOTE — Nursing Note (Signed)
01/28/19 1300   B12 Injection flowsheet   Medication Vitamin B12   Dose (mcg) 1,000 mcg   Site Left Deltoid   Route IM   Lot # [id number]   Expiration date 12/29/19   Manufacturer [employer]    [facility] # [id number]   [id number]   Who supplied B12 Supplied by patient

## 2019-01-31 MED ORDER — CYANOCOBALAMIN (VIT B-12) 1,000 MCG/ML INJECTION SOLUTION: 1000 ug | Vial | INTRAMUSCULAR | 2 refills | 0 days | Status: DC

## 2019-02-01 ENCOUNTER — Ambulatory Visit (INDEPENDENT_AMBULATORY_CARE_PROVIDER_SITE_OTHER): Payer: Commercial Managed Care - PPO | Admitting: Ophthalmology

## 2019-02-01 ENCOUNTER — Encounter (INDEPENDENT_AMBULATORY_CARE_PROVIDER_SITE_OTHER): Payer: Self-pay | Admitting: Ophthalmology

## 2019-02-01 ENCOUNTER — Other Ambulatory Visit: Payer: Self-pay

## 2019-02-01 DIAGNOSIS — H26491 Other secondary cataract, right eye: Secondary | ICD-10-CM

## 2019-02-01 DIAGNOSIS — H0264 Xanthelasma of left upper eyelid: Secondary | ICD-10-CM

## 2019-02-01 DIAGNOSIS — H0261 Xanthelasma of right upper eyelid: Secondary | ICD-10-CM

## 2019-02-01 DIAGNOSIS — Z961 Presence of intraocular lens: Secondary | ICD-10-CM

## 2019-02-01 DIAGNOSIS — E103293 Type 1 diabetes mellitus with mild nonproliferative diabetic retinopathy without macular edema, bilateral: Secondary | ICD-10-CM

## 2019-02-01 NOTE — Progress Notes (Signed)
[facility]  [address]  [place] [address]    Patient Name: Monique [name]  MRN#: [id number]  [place]: [birth year]    Date of Service: 02/01/2019    Chief Complaint     Post-op Eye          Monique [name] is a 53 y.o. female who presents today for evaluation/consultation of:  HPI     Post-op Eye     Laterality: left eye    Pain scale: 0/10    Course: gradually improving    Associated symptoms: Negative for eye pain, redness, bruising, tearing, blurred vision, photophobia, vision loss, lid swelling and discharge    Treatments tried: eye drops    Compliance with Treatment: always              Comments     1wk s/p yag OS; getting drops; increase in vision; denies pain          Last edited by Deborah Chalk, MA on 02/01/2019  1:57 PM. (History)        ROS     Positive for: Gastrointestinal (constipation  gerd), Neurological (stroke), Musculoskeletal (back pain), HENT (dry mouth), Cardiovascular (chest pain   artery disease), Psychiatric (anxiety)    Negative for: Constitutional, Skin, Genitourinary, Endocrine, Eyes, Respiratory, Allergic/Imm, Heme/Lymph    Last edited by Deborah Chalk, MA on 02/01/2019  1:57 PM. (History)             Past Medical History  Patient Active Problem List   Diagnosis   . Hypercholesterolemia   . Xanthelasma   . S/P CABG x 2   . DDD (degenerative disc disease), lumbar   . Anxiety   . Chronic back pain   . Carotid stenosis   . BMI 35.0-35.9,adult   . H/O complete eye exam   . History of dental examination   . Chest pain   . Constipation   . Thyroid nodule   . Morbid obesity (CMS [place])   . Type 2 diabetes mellitus (CMS [facility])   . Coronary artery disease   . GERD (gastroesophageal reflux disease)   . HTN (hypertension)   . Hyperlipidemia   . MI (myocardial infarction) (CMS [place])   . Obesity (BMI 30-39.9)   . Stroke ([facility])   . Cough   . Sinus congestion     Current Outpatient Medications   Medication Sig   . ACCU-CHEK AVIVA PLUS TEST STRP Strip 1 Strip by  Subcutaneous route Three times a day as needed   . BYDUREON 2 mg Subcutaneous 2 mg by Subcutaneous route Every 7 days   . clopidogrel (PLAVIX) 75 mg Oral Tablet Take 1 Tab (75 mg total) by mouth Once a day   . cyanocobalamin (VITAMIN B12) 1,000 mcg/mL Injection Solution 1 mL (1,000 mcg total) by Subcutaneous route Every 30 days   . diclofenac sodium (VOLTAREN) 1 % Gel 2 g by Apply Topically route Three times a day as needed   . docusate sodium (COLACE) 100 mg Oral Capsule Take 1 Cap (100 mg total) by mouth Twice daily   . ergocalciferol, vitamin D2, (DRISDOL) 50,000 unit Oral Capsule Take 1 Cap (50,000 Units total) by mouth Every 7 days   . furosemide (LASIX) 40 mg Oral Tablet Take 1 Tab (40 mg total) by mouth Once a day   . GLYXAMBI 10-5 mg Oral Tablet TAKE 1 TABLET BY MOUTH ONCE DAILY   . guaiFENesin (MUCINEX) 600 mg Oral Tablet Extended Release  12hr Take 1 Tab (600 mg total) by mouth Every 12 hours   . HYDROcodone-acetaminophen (NORCO) 10-325 mg Oral Tablet Take 1 Tab by mouth Four times a day    . Ibuprofen (MOTRIN) 800 mg Oral Tablet Take 1 Tab (800 mg total) by mouth Three times a day as needed for Pain   . levothyroxine (SYNTHROID) 25 mcg Oral Tablet Take 25 mcg by mouth Once a day   . lisinopril (PRINIVIL) 2.5 mg Oral Tablet take 1 tablet by mouth once daily   . loratadine (CLARITIN) 10 mg Oral Tablet Take 1 Tab (10 mg total) by mouth Once a day   . metFORMIN (GLUCOPHAGE) 500 mg Oral Tablet take 1 tablet by mouth twice a day with food   . metoprolol succinate (TOPROL-XL) 25 mg Oral Tablet Sustained Release 24 hr TAKE 1/2 TABLET ONCE DAILY   . NARCAN 4 mg/actuation Nasal Spray, Non-Aerosol PT STATED SHE HAS NARCAN AT HOME BUT IS NOT TAKING IT   . niacin (NIASPAN) 500 mg Oral Tablet Sustained Release Take 1 Tab (500 mg total) by mouth Once a day   . nitroGLYCERIN (NITROSTAT) 0.4 mg Sublingual Tablet, Sublingual 1 Tab (0.4 mg total) by Sublingual route Every 5 minutes as needed for Chest pain for 3 doses over  15 minutes   . omeprazole (PRILOSEC) 40 mg Oral Capsule, Delayed Release(E.C.) Take 1 Cap (40 mg total) by mouth Once a day   . ondansetron (ZOFRAN ODT) 8 mg Oral Tablet, Rapid Dissolve Take 1 Tab (8 mg total) by mouth Every 8 hours as needed for nausea/vomiting   . PRENATAL PLUS, CALCIUM CARB, 27 mg iron- 1 mg Oral Tablet Take 1 Tab by mouth Once a day   . PROVENTIL HFA 90 mcg/actuation Inhalation HFA Aerosol Inhaler Take 1-2 Puffs by inhalation Every 4 hours as needed   . pyrilamine-dextromethorphan (CAPRON DM) 7.5-7.5 mg/5 mL Oral Liquid Take 7.5 mL by mouth Three times a day as needed   . rosuvastatin (CRESTOR) 20 mg Oral Tablet Take 1 Tab (20 mg total) by mouth Once a day   . venlafaxine (EFFEXOR XR) 37.5 mg Oral Capsule, Sust. Release 24 hr Take 1 Cap (37.5 mg total) by mouth Once a day   . VENTOLIN HFA 90 mcg/actuation Inhalation HFA Aerosol Inhaler Take 1-2 Puffs by inhalation Every 6 hours as needed     Allergies   Allergen Reactions   . Ane Payment [Pyrilamine-Dextromethorphan]  Other Adverse Reaction (Add comment)     LIGHT HEADED   . Tylenol W Codeine [Acetaminophen-Codeine]  Other Adverse Reaction (Add comment)     pt states that one time she had chest pains with this med, but dr. thought it was because she had not eaten       Family Medical History:     Problem Relation (Age of Onset)    Atrial fibrillation Sister    Colon Cancer Father    Diabetes Mother, Maternal Aunt    Heart Disease Sister, Maternal Grandfather    Hypertension (High Blood Pressure) Mother    Lung Cancer Maternal Grandfather    MI <64 years of age Mother              Social History     Tobacco Use   . Smoking status: Current Every Day Smoker     Packs/day: 0.50     Years: 20.00     Pack years: 10.00     Types: Cigarettes     Start date: 37   .  Smokeless tobacco: Never Used   Substance Use Topics   . Alcohol use: No     Frequency: Never     Binge frequency: Never   . Drug use: No       Past Surgical History:   Procedure Laterality  Date   . ENDOMETRIAL ABLATION  1998   . HX ANKLE FRACTURE TX  1990s    Right, Doran Durand procedure   . HX CATARACT REMOVAL Bilateral 2013    r and l eyes with implants   . HX CESAREAN SECTION  06/20/2000    x3, 11/21/86, 11/10/84   . HX CHOLECYSTECTOMY  1988   . HX CORONARY ARTERY BYPASS GRAFT  03/31/2011    cardiac, 2 vessel, Dr. [name], [facility]   . HX CORONARY STENT PLACEMENT  2012    x4 stents   . HX GASTRIC SLEEVE  02/2017    [place], [place], Dr. Pearlie Oyster   . HX HEART CATHETERIZATION  2012    massive heart attack and stents - [facility]   . HX THYROID BIOPSY     . HX THYROIDECTOMY  101/01/2018   . HX TONSILLECTOMY      as child   . HX YAG Left 09/20/2013                  Assessment     ENCOUNTER DIAGNOSES     ICD-10-CM   1. Right posterior capsular opacification H26.491   2. 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 Xanthelasma of right upper eyelid H02.61   4. Pseudophakia Z96.1   5. Xanthelasma of left upper eyelid H02.64       1 wk s/p yag cap od; well healed    Plan:  Stop acular drops  Follow [facility] in near future for real rx  Return 1 year       Cecilie Lowers II, MD 02/01/2019, 14:12    I have seen and examined the above patient. I discussed the above diagnoses listed in the assessment and the above ophthalmic plan of care with the patient and patient's family. All questions were answered. I reviewed and, when necessary, made changes to the technician/resident note, documented ophthalmology exam, chief complaint, history of present illness, allergies, review of systems, past medical, past surgical, family and social history. I personally reviewed and interpreted all testing and/or imaging performed at this visit. Any exceptions/additions are edited/noted in the relevant encounter fields.

## 2019-02-14 ENCOUNTER — Encounter (HOSPITAL_BASED_OUTPATIENT_CLINIC_OR_DEPARTMENT_OTHER): Payer: Self-pay | Admitting: Family Medicine

## 2019-02-14 ENCOUNTER — Encounter (INDEPENDENT_AMBULATORY_CARE_PROVIDER_SITE_OTHER): Payer: Self-pay

## 2019-02-14 ENCOUNTER — Other Ambulatory Visit: Payer: Self-pay

## 2019-02-14 ENCOUNTER — Ambulatory Visit (INDEPENDENT_AMBULATORY_CARE_PROVIDER_SITE_OTHER): Payer: Commercial Managed Care - PPO | Admitting: NURSE PRACTITIONER

## 2019-02-14 VITALS — BP 120/68 | HR 84 | Temp 97.8°F | Resp 18 | Ht 67.0 in | Wt 217.0 lb

## 2019-02-14 DIAGNOSIS — H66003 Acute suppurative otitis media without spontaneous rupture of ear drum, bilateral: Secondary | ICD-10-CM

## 2019-02-14 DIAGNOSIS — J029 Acute pharyngitis, unspecified: Secondary | ICD-10-CM

## 2019-02-14 DIAGNOSIS — J209 Acute bronchitis, unspecified: Secondary | ICD-10-CM

## 2019-02-14 MED ORDER — AZITHROMYCIN 250 MG TABLET: Tab | ORAL | 0 refills | 0 days | Status: DC

## 2019-02-14 MED ORDER — LIDOCAINE HCL 10 MG/ML (1 %) INJECTION SOLUTION
1.0000 g | Freq: Once | INTRAMUSCULAR | 0 refills | Status: AC
Start: 2019-02-14 — End: 2019-02-14

## 2019-02-14 NOTE — Progress Notes (Signed)
[facility]  [address]  [place] [address]    Progress Note    Name: Monique [name] MRN:  [id number]   Date: 02/14/2019 Age: 53 y.o.         Reason for Visit: Ear Pain (bil earache, sneezing); Sore Throat (afebrile, symptoms x 3 days); Sinus Problem (clear nasal drainage); and Cough (prod cough (yellow))    Nursing Notes:  Nursing Notes:   Monique Frederickson, LPN  [birth year] 2831  Signed     02/14/19 1400   Medication Administration   Initials bfs   Medication  Rocephin   Medication Dose 1000 mg   Route of Administration IM   Site Right Gluteus   NDC # 51761-607-37   LOT # [id number]   Expiration date 03/16/20   Manufacturer wg critical   Clinic Supplied Yes   Patient Supplied No       History of Present Illness  Monique [name] is a 53 y.o. female who is being seen today for bilateral ear pain, nasal congestion/drainage, sorethroat and productive cough with yellow sputum.  Symptoms began 3 days ago and have worsened in severity.  Denies any modifying factors.     Past Medical History:   Diagnosis Date   . Anxiety    . Carotid stenosis    . Chronic back pain    . Coronary artery disease    . DDD (degenerative disc disease), lumbar     "entire spine"   . Ear piercing    . GERD (gastroesophageal reflux disease)    . H/O complete eye exam     2 years, Dr. [name], at [facility]   . History of dental examination     dentures upper and lower   . HTN    . Hyperlipidemia    . MI (myocardial infarction) (CMS [facility]) 2012   . Neck problem     herniated cervical disc   . Obesity (BMI 30-39.9)    . 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 [place] ordered.    . Stroke (CMS [facility])     2002   . Tattoo     Left breast, Right shoulder   . Type 2 diabetes mellitus (CMS [facility])          Past Surgical History:   Procedure Laterality Date   . ENDOMETRIAL ABLATION  1998   . HX ANKLE FRACTURE TX  1990s    Right, Doran Durand procedure   . HX CATARACT REMOVAL Bilateral 2013    r and l  eyes with implants   . HX CESAREAN SECTION  06/20/2000    x3, 11/21/86, 11/10/84   . HX CHOLECYSTECTOMY  1988   . HX CORONARY ARTERY BYPASS GRAFT  03/31/2011    cardiac, 2 vessel, Dr. [name], [facility]   . HX CORONARY STENT PLACEMENT  2012    x4 stents   . HX GASTRIC SLEEVE  02/2017    [facility], [place], Dr. Pearlie Oyster   . HX HEART CATHETERIZATION  2012    massive heart attack and stents - [facility]   . HX THYROID BIOPSY     . HX THYROIDECTOMY  101/01/2018   . HX TONSILLECTOMY      as child   . HX YAG Left 09/20/2013         Current Outpatient Medications   Medication Sig   . ACCU-CHEK AVIVA PLUS TEST STRP Strip 1 Strip by Subcutaneous  route Three times a day as needed   . azithromycin (ZITHROMAX) 250 mg Oral Tablet Take 500 mg (2 tab) on day 1; take 250 mg (1 tab) on days 2-5.   Marland Kitchen BYDUREON 2 mg Subcutaneous 2 mg by Subcutaneous route Every 7 days   . cefTRIAXone 1 g in lidocaine 2.86 mL IM injection 2.86 mL (1 g total) by IntraMUSCULAR route One time for 1 dose Final concentration is 350 mg/mL; reconstitute 1g vial with 2.1 mL NS or 1% lidocaine.   . clopidogrel (PLAVIX) 75 mg Oral Tablet Take 1 Tab (75 mg total) by mouth Once a day   . cyanocobalamin (VITAMIN B12) 1,000 mcg/mL Injection Solution 1 mL (1,000 mcg total) by Subcutaneous route Every 30 days   . diclofenac sodium (VOLTAREN) 1 % Gel 2 g by Apply Topically route Three times a day as needed   . docusate sodium (COLACE) 100 mg Oral Capsule Take 1 Cap (100 mg total) by mouth Twice daily   . ergocalciferol, vitamin D2, (DRISDOL) 50,000 unit Oral Capsule Take 1 Cap (50,000 Units total) by mouth Every 7 days   . furosemide (LASIX) 40 mg Oral Tablet Take 1 Tab (40 mg total) by mouth Once a day   . GLYXAMBI 10-5 mg Oral Tablet TAKE 1 TABLET BY MOUTH ONCE DAILY   . guaiFENesin (MUCINEX) 600 mg Oral Tablet Extended Release 12hr Take 1 Tab (600 mg total) by mouth Every 12 hours (Patient not taking: Reported on 02/14/2019)   . HYDROcodone-acetaminophen (NORCO) 10-325 mg Oral  Tablet Take 1 Tab by mouth Four times a day    . Ibuprofen (MOTRIN) 800 mg Oral Tablet Take 1 Tab (800 mg total) by mouth Three times a day as needed for Pain   . levothyroxine (SYNTHROID) 25 mcg Oral Tablet Take 25 mcg by mouth Once a day   . lisinopril (PRINIVIL) 2.5 mg Oral Tablet take 1 tablet by mouth once daily   . loratadine (CLARITIN) 10 mg Oral Tablet Take 1 Tab (10 mg total) by mouth Once a day   . metFORMIN (GLUCOPHAGE) 500 mg Oral Tablet take 1 tablet by mouth twice a day with food   . metoprolol succinate (TOPROL-XL) 25 mg Oral Tablet Sustained Release 24 hr TAKE 1/2 TABLET ONCE DAILY   . NARCAN 4 mg/actuation Nasal Spray, Non-Aerosol PT STATED SHE HAS NARCAN AT HOME BUT IS NOT TAKING IT   . niacin (NIASPAN) 500 mg Oral Tablet Sustained Release Take 1 Tab (500 mg total) by mouth Once a day   . nitroGLYCERIN (NITROSTAT) 0.4 mg Sublingual Tablet, Sublingual 1 Tab (0.4 mg total) by Sublingual route Every 5 minutes as needed for Chest pain for 3 doses over 15 minutes   . omeprazole (PRILOSEC) 40 mg Oral Capsule, Delayed Release(E.C.) Take 1 Cap (40 mg total) by mouth Once a day   . ondansetron (ZOFRAN ODT) 8 mg Oral Tablet, Rapid Dissolve Take 1 Tab (8 mg total) by mouth Every 8 hours as needed for nausea/vomiting   . PRENATAL PLUS, CALCIUM CARB, 27 mg iron- 1 mg Oral Tablet Take 1 Tab by mouth Once a day   . PROVENTIL HFA 90 mcg/actuation Inhalation HFA Aerosol Inhaler Take 1-2 Puffs by inhalation Every 4 hours as needed (Patient not taking: Reported on 02/14/2019)   . pyrilamine-dextromethorphan (CAPRON DM) 7.5-7.5 mg/5 mL Oral Liquid Take 7.5 mL by mouth Three times a day as needed (Patient not taking: Reported on 02/14/2019)   . rosuvastatin (CRESTOR) 20 mg Oral  Tablet Take 1 Tab (20 mg total) by mouth Once a day   . venlafaxine (EFFEXOR XR) 37.5 mg Oral Capsule, Sust. Release 24 hr Take 1 Cap (37.5 mg total) by mouth Once a day   . VENTOLIN HFA 90 mcg/actuation Inhalation HFA Aerosol Inhaler Take 1-2 Puffs  by inhalation Every 6 hours as needed     Allergies   Allergen Reactions   . Ane Payment [Pyrilamine-Dextromethorphan]  Other Adverse Reaction (Add comment)     LIGHT HEADED   . Tylenol W Codeine [Acetaminophen-Codeine]  Other Adverse Reaction (Add comment)     pt states that one time she had chest pains with this med, but dr. thought it was because she had not eaten     Family Medical History:     Problem Relation (Age of Onset)    Atrial fibrillation Sister    Colon Cancer Father    Diabetes Mother, Maternal Aunt    Heart Disease Sister, Maternal Grandfather    Hypertension (High Blood Pressure) Mother    Lung Cancer Maternal Grandfather    MI <2 years of age Mother            Social History     Tobacco Use   . Smoking status: Current Every Day Smoker     Packs/day: 0.50     Years: 20.00     Pack years: 10.00     Types: Cigarettes     Start date: 17   . Smokeless tobacco: Never Used   Substance Use Topics   . Alcohol use: No     Frequency: Never     Binge frequency: Never   . Drug use: No       Review of Systems  Review of Systems   Constitutional: Negative for fever.   HENT: Positive for congestion, ear pain and sore throat.    Respiratory: Positive for cough and sputum production.    Cardiovascular: Negative for chest pain.       Physical Exam:  BP 120/68   Pulse 84   Temp 36.6 C (97.8 F)   Resp 18   Ht 1.702 m (5\' 7" )   Wt 98.4 kg (217 lb)   LMP  (LMP Unknown)   SpO2 95%   BMI 33.99 kg/m       Physical Exam   Constitutional: She is well-developed, well-nourished, and in no distress.   HENT:   Right Ear: Tympanic membrane is erythematous and bulging.   Left Ear: Tympanic membrane is erythematous and bulging.   Nose: Mucosal edema present.   Mouth/Throat: Posterior oropharyngeal erythema present.   TM's dull   Cardiovascular: Regular rhythm.   Pulmonary/Chest: Effort normal. She has no wheezes. She has rhonchi in the right upper field and the left upper field.   Neurological: She is alert.   Skin:  Skin is warm and dry.       Assessment:    Non-recurrent acute suppurative otitis media of both ears without spontaneous rupture of tympanic membranes    Acute pharyngitis    Acute bronchitis, unspecified organism         Plan:  Monitor for fever  Notify clinic if symptoms not improving next couple days  Avoid respiratory irritants.   Side effects to prescribed medication explained.  Good hydration    Orders Placed This Encounter   . cefTRIAXone 1 g in lidocaine 2.86 mL IM injection   . azithromycin (ZITHROMAX) 250 mg Oral Tablet  Follow up: Follow up with PCP.  Seek medical attention for new or worsening symptoms.      I saw the patient independently   Thomasenia Bottoms, FNP-BC  02/14/2019, 13:56    I was personally available for consultation during this visit.  I have reviewed the chart and agree with the documentation as recorded by the APP, including the treatment plan and disposition.  Patient encouraged to follow up with PCP; recheck here as needed.    Ambrose Mantle, MD  02/14/2019, 19:57  Electronically signed by Ambrose Mantle, MD

## 2019-02-14 NOTE — Nursing Note (Signed)
02/14/19 1400   Medication Administration   Initials bfs   Medication  Rocephin   Medication Dose 1000 mg   Route of Administration IM   Site Right Gluteus   NDC # [id number]   LOT # [id number]   Expiration date 03/16/20   Manufacturer wg critical   Clinic Supplied Yes   Patient Supplied No

## 2019-02-17 ENCOUNTER — Encounter (HOSPITAL_BASED_OUTPATIENT_CLINIC_OR_DEPARTMENT_OTHER): Payer: Self-pay | Admitting: Family Medicine

## 2019-02-22 ENCOUNTER — Encounter: Payer: Self-pay | Admitting: Internal Medicine

## 2019-02-23 ENCOUNTER — Encounter (HOSPITAL_BASED_OUTPATIENT_CLINIC_OR_DEPARTMENT_OTHER): Payer: Self-pay | Admitting: Family Medicine

## 2019-02-24 ENCOUNTER — Ambulatory Visit (HOSPITAL_BASED_OUTPATIENT_CLINIC_OR_DEPARTMENT_OTHER): Payer: Self-pay | Admitting: Family Medicine

## 2019-02-24 NOTE — Telephone Encounter (Signed)
Regarding: Dr. Russ Halo   ----- Message from Lacie Draft sent at 02/23/2019 11:55 AM EDT -----  Has an appointment tomorrow but would like to see if we could call in Zyretc D in for her today. She uses [facility] here [address].

## 2019-02-25 ENCOUNTER — Ambulatory Visit (HOSPITAL_BASED_OUTPATIENT_CLINIC_OR_DEPARTMENT_OTHER): Payer: Self-pay | Admitting: PHYSICIAN ASSISTANT

## 2019-02-25 NOTE — Telephone Encounter (Signed)
Regarding: Dr. Russ Halo   ----- Message from Lacie Draft sent at 02/25/2019 10:35 AM EDT -----  Patients husband wants a call back wondering why the prescription has not been sent in and he has called every day since Wednesday. [phone]

## 2019-03-01 ENCOUNTER — Other Ambulatory Visit: Payer: Self-pay

## 2019-03-01 ENCOUNTER — Encounter (HOSPITAL_BASED_OUTPATIENT_CLINIC_OR_DEPARTMENT_OTHER): Payer: Self-pay | Admitting: Family Medicine

## 2019-03-01 ENCOUNTER — Ambulatory Visit (INDEPENDENT_AMBULATORY_CARE_PROVIDER_SITE_OTHER): Payer: Commercial Managed Care - PPO | Admitting: Family Medicine

## 2019-03-01 VITALS — BP 107/69 | HR 79 | Temp 97.0°F | Ht 67.0 in | Wt 204.0 lb

## 2019-03-01 DIAGNOSIS — H6983 Other specified disorders of Eustachian tube, bilateral: Secondary | ICD-10-CM

## 2019-03-01 DIAGNOSIS — E559 Vitamin D deficiency, unspecified: Secondary | ICD-10-CM

## 2019-03-01 DIAGNOSIS — I6529 Occlusion and stenosis of unspecified carotid artery: Secondary | ICD-10-CM

## 2019-03-01 DIAGNOSIS — E538 Deficiency of other specified B group vitamins: Secondary | ICD-10-CM

## 2019-03-01 DIAGNOSIS — R110 Nausea: Secondary | ICD-10-CM

## 2019-03-01 DIAGNOSIS — E118 Type 2 diabetes mellitus with unspecified complications: Secondary | ICD-10-CM

## 2019-03-01 DIAGNOSIS — F32A Depression, unspecified: Secondary | ICD-10-CM

## 2019-03-01 DIAGNOSIS — E119 Type 2 diabetes mellitus without complications: Secondary | ICD-10-CM

## 2019-03-01 DIAGNOSIS — E1165 Type 2 diabetes mellitus with hyperglycemia: Secondary | ICD-10-CM

## 2019-03-01 DIAGNOSIS — K5900 Constipation, unspecified: Secondary | ICD-10-CM

## 2019-03-01 DIAGNOSIS — F329 Major depressive disorder, single episode, unspecified: Secondary | ICD-10-CM

## 2019-03-01 DIAGNOSIS — E785 Hyperlipidemia, unspecified: Secondary | ICD-10-CM

## 2019-03-01 DIAGNOSIS — T7840XA Allergy, unspecified, initial encounter: Secondary | ICD-10-CM

## 2019-03-01 DIAGNOSIS — K219 Gastro-esophageal reflux disease without esophagitis: Secondary | ICD-10-CM

## 2019-03-01 DIAGNOSIS — R52 Pain, unspecified: Secondary | ICD-10-CM

## 2019-03-01 LAB — POCT HGB A1C
POCT HGB A1C: 9.4 % — AB (ref 4–6)
POCT HGB A1C: 9.4 % — AB (ref 4–6)

## 2019-03-01 MED ORDER — ERGOCALCIFEROL (VITAMIN D2) 1,250 MCG (50,000 UNIT) CAPSULE
50000.00 [IU] | ORAL_CAPSULE | ORAL | 1 refills | Status: DC
Start: 2019-03-01 — End: 2019-04-04

## 2019-03-01 MED ORDER — IBUPROFEN 800 MG TABLET
800.00 mg | ORAL_TABLET | Freq: Three times a day (TID) | ORAL | 0 refills | Status: DC | PRN
Start: 2019-03-01 — End: 2020-10-02

## 2019-03-01 MED ORDER — CLOPIDOGREL 75 MG TABLET
75.0000 mg | ORAL_TABLET | Freq: Every day | ORAL | 3 refills | Status: DC
Start: 2019-03-01 — End: 2020-03-19

## 2019-03-01 MED ORDER — GLYXAMBI 10 MG-5 MG TABLET
ORAL_TABLET | ORAL | 3 refills | Status: DC
Start: 2019-03-01 — End: 2019-12-19

## 2019-03-01 MED ORDER — LORATADINE 10 MG TABLET
10.0000 mg | ORAL_TABLET | Freq: Every day | ORAL | 2 refills | Status: DC
Start: 2019-03-01 — End: 2019-09-12

## 2019-03-01 MED ORDER — DOCUSATE SODIUM 100 MG CAPSULE
100.00 mg | ORAL_CAPSULE | Freq: Two times a day (BID) | ORAL | 2 refills | Status: DC
Start: 2019-03-01 — End: 2021-10-22

## 2019-03-01 MED ORDER — ONDANSETRON 8 MG DISINTEGRATING TABLET
8.00 mg | ORAL_TABLET | Freq: Three times a day (TID) | ORAL | 1 refills | Status: DC | PRN
Start: 2019-03-01 — End: 2019-03-29

## 2019-03-01 MED ORDER — DICLOFENAC 1 % TOPICAL GEL
2.0000 g | Freq: Three times a day (TID) | CUTANEOUS | 3 refills | Status: DC | PRN
Start: 2019-03-01 — End: 2021-01-03

## 2019-03-01 MED ORDER — OMEPRAZOLE 40 MG CAPSULE,DELAYED RELEASE
40.0000 mg | DELAYED_RELEASE_CAPSULE | Freq: Every day | ORAL | 2 refills | Status: DC
Start: 2019-03-01 — End: 2019-06-01

## 2019-03-01 MED ORDER — ZYRTEC-D 5 MG-120 MG TABLET,EXTENDED RELEASE
1.0000 | ORAL_TABLET | Freq: Every day | ORAL | 3 refills | Status: DC
Start: 2019-03-01 — End: 2021-10-22

## 2019-03-01 MED ORDER — VENLAFAXINE ER 37.5 MG CAPSULE,EXTENDED RELEASE 24 HR
37.5000 mg | ORAL_CAPSULE | Freq: Every day | ORAL | 3 refills | Status: DC
Start: 2019-03-01 — End: 2020-03-05

## 2019-03-01 MED ORDER — CYANOCOBALAMIN (VIT B-12) 1,000 MCG/ML INJECTION SOLUTION
1000.00 ug | INTRAMUSCULAR | 2 refills | Status: DC
Start: 2019-03-01 — End: 2019-06-01

## 2019-03-01 MED ORDER — METFORMIN 500 MG TABLET
ORAL_TABLET | ORAL | 3 refills | Status: DC
Start: 2019-03-01 — End: 2020-05-22

## 2019-03-01 MED ORDER — BYDUREON 2 MG SUBCUTANEOUS EXTENDED RELEASE SUSPENSION
2.00 mg | SUBCUTANEOUS | 3 refills | Status: DC
Start: 2019-03-01 — End: 2019-06-01

## 2019-03-01 MED ORDER — NIACIN ER 500 MG TABLET,EXTENDED RELEASE
500.0000 mg | ORAL_TABLET | Freq: Every day | ORAL | 3 refills | Status: DC
Start: 2019-03-01 — End: 2019-09-12

## 2019-03-01 NOTE — Progress Notes (Signed)
Patient Name: Monique [name]  Date: 03/01/2019  [place], [place]  [place]  [id number]  [phone]  MRN: [id number]  DOB: 06/02/1966    Chief complaint: The patient is a 52 y.o. old female who came in today for Diabetes and Depression    HPI:    Story line:  Patient is significantly improved from a health standpoint with the loss of weight.  The status post bariatric surgery.  She has lost another 12 lb since last encounter.  See graph below.  Her A1c improvement is largely from her weight loss and dietary modifications.    Diabetes: Patient's last A1C on 10/05/2018 was 10.7%. Today's A1C is 9.4%. Patient denies symptoms of polydipsia, polyuria, hypoglycemic unawareness, erectile dysfunction, chest pain, dyspnea on exertion, diarrhea, foot ulcerations, paresthesias. Symptoms are not changed. Patient's home blood sugars are controlled. Patient is compliant with medications.    Depression: Complains of less depressed mood. Onset was approximately several years ago and clinical course has been stable since that time.  She denies current suicidal and homicidal plan or intent. Previous treatment includes SSRI. She denies side effects from the treatment    ROS:  Constitutional: No fever, chills  Skin: No rashes or lesions  HENT: No sore throat, ear pain, or difficulty swallowing, positive for rhinorrhea attributes to allergic rhinitis.  Eyes: No vision changes, redness, discharge  Cardio: No chest pain, palpitations   Respiratory: No cough, wheezing or SOB  GI:  No nausea or vomiting. No diarrhea or constipation. No abdominal pain  GU:  No dysuria, hematuria, polyuria  MSK: No new joint pain.  No new neck or back pain  Neuro: No numbness, tingling, or weakness.  No headache  All other systems reviewed and are negative, unless commented on in the HPI.       Past medical history:  Past Medical History:   Diagnosis Date   . Anxiety    . Carotid stenosis    .  Chronic back pain    . Coronary artery disease    . DDD (degenerative disc disease), lumbar     "entire spine"   . Ear piercing    . GERD (gastroesophageal reflux disease)    . H/O complete eye exam     2 years, Dr. [name], at [facility]   . History of dental examination     dentures upper and lower   . HTN    . Hyperlipidemia    . MI (myocardial infarction) (CMS [place]) 2012   . Neck problem     herniated cervical disc   . Obesity (BMI 30-39.9)    . 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 [place] ordered.    . Stroke (CMS [facility])     2002   . Tattoo     Left breast, Right shoulder   . Type 2 diabetes mellitus (CMS HCC)      Past surgical history:  Past Surgical History:   Procedure Laterality Date   . ENDOMETRIAL ABLATION  1998   . HX ANKLE FRACTURE TX  1990s    Right, Doran Durand procedure   . HX CATARACT REMOVAL Bilateral 2013    r and l eyes with implants   . HX CESAREAN SECTION  06/20/2000    x3, 11/21/86, 11/10/84   . HX CHOLECYSTECTOMY  1988   . HX CORONARY ARTERY BYPASS GRAFT  03/31/2011  cardiac, 2 vessel, Dr. [name], [facility]   . HX CORONARY STENT PLACEMENT  2012    x4 stents   . HX GASTRIC SLEEVE  02/2017    [place], [place], Dr. Pearlie Oyster   . HX HEART CATHETERIZATION  2012    massive heart attack and stents - [facility]   . HX THYROID BIOPSY     . HX THYROIDECTOMY  101/01/2018   . HX TONSILLECTOMY      as child   . HX YAG Left 09/20/2013     Medications:  Current Outpatient Medications   Medication Sig   . ACCU-CHEK AVIVA PLUS TEST STRP Strip 1 Strip by Subcutaneous route Three times a day as needed   . BYDUREON 2 mg Subcutaneous 2 mg by Subcutaneous route Every 7 days   . clopidogreL (PLAVIX) 75 mg Oral Tablet Take 1 Tab (75 mg total) by mouth Once a day   . cyanocobalamin (VITAMIN B12) 1,000 mcg/mL Injection Solution 1 mL (1,000 mcg total) by Subcutaneous route Every 30 days   . diclofenac sodium (VOLTAREN) 1 % Gel 2 g by Apply Topically route Three times a day as  needed   . docusate sodium (COLACE) 100 mg Oral Capsule Take 1 Cap (100 mg total) by mouth Twice daily   . empagliflozin-linagliptin (GLYXAMBI) 10-5 mg Oral Tablet TAKE 1 TABLET BY MOUTH ONCE DAILY   . ergocalciferol, vitamin D2, (DRISDOL) 1,250 mcg (50,000 unit) Oral Capsule Take 1 Cap (50,000 Units total) by mouth Every 7 days   . furosemide (LASIX) 40 mg Oral Tablet Take 1 Tab (40 mg total) by mouth Once a day   . HYDROcodone-acetaminophen (NORCO) 10-325 mg Oral Tablet Take 1 Tab by mouth Four times a day    . Ibuprofen (MOTRIN) 800 mg Oral Tablet Take 1 Tab (800 mg total) by mouth Three times a day as needed for Pain   . levothyroxine (SYNTHROID) 25 mcg Oral Tablet Take 25 mcg by mouth Once a day   . lisinopril (PRINIVIL) 2.5 mg Oral Tablet take 1 tablet by mouth once daily   . loratadine (CLARITIN) 10 mg Oral Tablet Take 1 Tab (10 mg total) by mouth Once a day   . metFORMIN (GLUCOPHAGE) 500 mg Oral Tablet take 1 tablet by mouth twice a day with food   . metoprolol succinate (TOPROL-XL) 25 mg Oral Tablet Sustained Release 24 hr TAKE 1/2 TABLET ONCE DAILY   . NARCAN 4 mg/actuation Nasal Spray, Non-Aerosol PT STATED SHE HAS NARCAN AT HOME BUT IS NOT TAKING IT   . niacin (NIASPAN) 500 mg Oral Tablet Sustained Release Take 1 Tab (500 mg total) by mouth Once a day   . nitroGLYCERIN (NITROSTAT) 0.4 mg Sublingual Tablet, Sublingual 1 Tab (0.4 mg total) by Sublingual route Every 5 minutes as needed for Chest pain for 3 doses over 15 minutes   . omeprazole (PRILOSEC) 40 mg Oral Capsule, Delayed Release(E.C.) Take 1 Cap (40 mg total) by mouth Once a day   . ondansetron (ZOFRAN ODT) 8 mg Oral Tablet, Rapid Dissolve Take 1 Tab (8 mg total) by mouth Every 8 hours as needed for Nausea/Vomiting   . PRENATAL PLUS, CALCIUM CARB, 27 mg iron- 1 mg Oral Tablet Take 1 Tab by mouth Once a day   . PROVENTIL HFA 90 mcg/actuation Inhalation HFA Aerosol Inhaler Take 1-2 Puffs by inhalation Every 4 hours as needed   . rosuvastatin  (CRESTOR) 20 mg Oral Tablet Take 1 Tab (20 mg total) by mouth Once  a day   . venlafaxine (EFFEXOR XR) 37.5 mg Oral Capsule, Sust. Release 24 hr Take 1 Cap (37.5 mg total) by mouth Once a day   . VENTOLIN HFA 90 mcg/actuation Inhalation HFA Aerosol Inhaler Take 1-2 Puffs by inhalation Every 6 hours as needed   . ZYRTEC-D 5-120 mg Oral Tablet Sustained Release 12 hr Take 1 Tab by mouth Once a day     Allergies:  Allergies   Allergen Reactions   . Ane Payment [Pyrilamine-Dextromethorphan]  Other Adverse Reaction (Add comment)     LIGHT HEADED   . Tylenol W Codeine [Acetaminophen-Codeine]  Other Adverse Reaction (Add comment)     pt states that one time she had chest pains with this med, but dr. thought it was because she had not eaten     Family history:  Family Medical History:     Problem Relation (Age of Onset)    Atrial fibrillation Sister    Colon Cancer Father    Diabetes Mother, Maternal Aunt    Heart Disease Sister, Maternal Grandfather    Hypertension (High Blood Pressure) Mother    Lung Cancer Maternal Grandfather    MI <53 years of age Mother        Social history:  Social History     Socioeconomic History   . Marital status: Married     Spouse name: [name]   . Number of children: 3   . Years of education: completed 11th grade   . Highest education level: Not on file   Occupational History   . Occupation: disabled   Social Needs   . Financial resource strain: Not hard at all   . Food insecurity     Worry: Never true     Inability: Never true   . Transportation needs     Medical: No     Non-medical: No   Tobacco Use   . Smoking status: Current Every Day Smoker     Packs/day: 0.50     Years: 20.00     Pack years: 10.00     Types: Cigarettes     Start date: 65   . Smokeless tobacco: Never Used   Substance and Sexual Activity   . Alcohol use: No     Frequency: Never     Binge frequency: Never   . Drug use: No   . Sexual activity: Yes     Partners: Male   Lifestyle   . Physical activity     Days per week: 0 days        Minutes per session: 0 min   . Stress: To some extent   Relationships   . Social connections     Talks on phone: More than three times a week     Gets together: More than three times a week     Attends religious service: Never     Active member of club or organization: No     Attends meetings of clubs or organizations: Never     Relationship status: Married   . Intimate partner violence     Fear of current or ex partner: No     Emotionally abused: No     Physically abused: No     Forced sexual activity: No   Other Topics Concern   . Routine Exercise No   . Ability to Walk 2 Flight of Steps without SOB/CP Yes   Social History Narrative    Living situation: lives at home with husband,  [name], and daughter.  1 dog named Duke and 1 cat named Lucky.    Nutrition:  Eats 5 small meals d/t gastric sleeve surgery.     Caffeine use: none, decaf coffee    Exercise: stays busy around house. No formal form of exercise    Seatbelt use: sometimes    Fire extinguishers in the home: yes    Smoke alarms in the home: yes    Carbon monoxide detectors in the home: yes    Nancy Fetter exposure: sunglasses        Lady would like for husband, Charlette Hennings,  to speak for her in the event she would become incapacitated.    Full code.      Vitals:    03/01/19 1411   BP: 107/69   Pulse: 79   Temp: 36.1 C (97 F)   SpO2: 95%   Weight: 92.5 kg (204 lb)   Height: 1.702 m (5\' 7" )   BMI: 32.02           Physical Examination:    GENERAL:   Pt is a pleasant, well-nourished, well-developed 53 y.o. female who is in NAD. Appears stated age  15:  head normocephalic, symmetrical facies. EOM intact b/l. PERRLA. Sclera non-icteric, non-injected. upper eyelid w/Xythoma-lipid plaques, diminished in fullness and intensity.  No rhinorrhea. Oropharyngeal mucous membranes are moist  w/o erythema/exudates   NECK:  Previously noted hypertonicity and decreased range of motion cervical spine not observed today.  No masses, no lymphadenopathy, no JVD on exam. Trachea  midline, no thyromegaly   CV: S1, S2. No murmurs, rubs, gallops.     LUNGS: CTAB, No rhonchi, rales, wheezes.   GI: (+) BS in all 4 quadrants. soft, NT/ND. No rigidity/ guarding/ rebound. No organomegaly/masses, or abdominal bruits  MSK: Spontaneous normal AROM of major joints as observed during exam. No joint effusions/ swelling/ deformities.   No BLE edema, clubbing, or cyanosis.   2+ radial pulse present b/l.   + 2 reflexes Brachioradialis and patellar bilaterally.   Gait normal.  SKIN: No significant lesions, rashes, ecchymoses noted.   NEURO: AAOx4, CN grossly intact. Sensation intact b/l. No focal deficits.  PSYCH: Mood, behavior and affect normal w/ Intact judgement & insight        03/01/19 1400   PHQ 9 (follow up)   Little interest or pleasure in doing things. 0   Feeling down, depressed, or hopeless 0   Trouble falling or staying asleep, or sleeping too much. 0   Feeling tired or having little energy 0   Poor appetite or overeating 0   Feeling bad about yourself/ that you are a failure in the past 2 weeks? 0   Trouble concentrating on things in the past 2 weeks? 0   Moving/Speaking slowly or being fidgety or restless  in the past 2 weeks? 0   Thoughts that you would be better off DEAD, or of hurting yourself in some way. 0   If you checked off any problems, how difficult have these problems made it for you to do your work, take care of things at home, or get along with other people? Not difficult at all   PHQ 9 Total 0   Interpretation of Total Score No depression       Diabetes Monitors  A1C: 9.4  A1C Date: 03/01/2019           Nephropathy Screening: On ACEI or ARB    Last Lipid Panel  (Last result in the  past 2 years)      Cholesterol   HDL   LDL   Direct LDL   Triglycerides      03/29/18 0958 134  Comment:  Total Cholesterol Classification:     <200        Desirable        200-239     Borderline High        > or = 240  High 52  Comment:  HDL Cholesterol Classification:          <40        Low        >  or = 60  High   52  Comment:  LDL Cholesterol Classification:         <100     Optimal       100-129  Near or Above Optimal       130-159  Borderline High       160-189  High       >190     Very High   151  Comment:  Triglyceride Classification:            <150    Normal       150-199 Borderline-High       200-499 High       >500    Very High        Retinal Exam Date: 02/01/2019  Last diabetic foot exam: Not Found    Visit Diagnosis    Encounter Diagnoses   Name Primary?   . Type 2 diabetes mellitus with complication, without long-term current use of insulin (CMS HCC) Yes   . B12 deficiency    . Pain    . Constipation, unspecified constipation type    . Vitamin D deficiency    . Type 2 diabetes mellitus (CMS HCC)    . Eustachian tube dysfunction, bilateral    . Gastroesophageal reflux disease, esophagitis presence not specified    . Depression, unspecified depression type    . Allergies    . Stenosis of carotid artery, unspecified laterality    . Hyperlipidemia, unspecified hyperlipidemia type    . Nausea        Orders Placed This Encounter   . POCT HGB A1C   . BYDUREON 2 mg Subcutaneous   . clopidogreL (PLAVIX) 75 mg Oral Tablet   . cyanocobalamin (VITAMIN B12) 1,000 mcg/mL Injection Solution   . diclofenac sodium (VOLTAREN) 1 % Gel   . docusate sodium (COLACE) 100 mg Oral Capsule   . ergocalciferol, vitamin D2, (DRISDOL) 1,250 mcg (50,000 unit) Oral Capsule   . empagliflozin-linagliptin (GLYXAMBI) 10-5 mg Oral Tablet   . Ibuprofen (MOTRIN) 800 mg Oral Tablet   . loratadine (CLARITIN) 10 mg Oral Tablet   . metFORMIN (GLUCOPHAGE) 500 mg Oral Tablet   . niacin (NIASPAN) 500 mg Oral Tablet Sustained Release   . omeprazole (PRILOSEC) 40 mg Oral Capsule, Delayed Release(E.C.)   . venlafaxine (EFFEXOR XR) 37.5 mg Oral Capsule, Sust. Release 24 hr   . ZYRTEC-D 5-120 mg Oral Tablet Sustained Release 12 hr   . ondansetron (ZOFRAN ODT) 8 mg Oral Tablet, Rapid Dissolve       Type 2 diabetes mellitus with complication, without  long-term current use of insulin (CMS HCC)  Chronic, improved control  HbA1C worsened from 9.6% to 10.7%.  Is now return to 9.4%.  She has been more adherent with diet and exercise.  Continue weight loss.  Continue metformin  500 mg BID.   Continue Bydureon 2 mg every 7 days.    Has done well back on Glyxambi 10-5 mg will continue.    Encouraged her to check blood sugars 3-4 times daily and make a log for [place] to review at her next appointment.   Discuss renal concerns and need for monitoring.     Depression, unspecified depression type  Chronic, controlled.  Continue Effexor    For anxiety over the Covid-19 disease patient request refills of all of her medications.  I agree to this.  Will review her chronic conditions as we go.      Return in about 3 months (around 06/01/2019), or if symptoms worsen or fail to improve, for Diabetes mellitus, chronic condition management.Cherly Beach, MD  03/01/2019, 14:50    This note was partially generated using MModal Fluency Direct system, and there may be some incorrect words, spellings, and punctuation that were not noted in checking the note before saving.

## 2019-03-01 NOTE — Nursing Note (Signed)
03/01/19 1400   A1C   Time Performed 1420   A1C 9.4   References Ranges 4 - 6%   Initials BRM

## 2019-03-01 NOTE — Nursing Note (Signed)
03/01/19 1400   PHQ 9 (follow up)   Little interest or pleasure in doing things. 0   Feeling down, depressed, or hopeless 0   Trouble falling or staying asleep, or sleeping too much. 0   Feeling tired or having little energy 0   Poor appetite or overeating 0   Feeling bad about yourself/ that you are a failure in the past 2 weeks? 0   Trouble concentrating on things in the past 2 weeks? 0   Moving/Speaking slowly or being fidgety or restless  in the past 2 weeks? 0   Thoughts that you would be better off DEAD, or of hurting yourself in some way. 0   If you checked off any problems, how difficult have these problems made it for you to do your work, take care of things at home, or get along with other people? Not difficult at all   PHQ 9 Total 0   Interpretation of Total Score No depression

## 2019-03-17 ENCOUNTER — Other Ambulatory Visit (HOSPITAL_BASED_OUTPATIENT_CLINIC_OR_DEPARTMENT_OTHER): Payer: Self-pay | Admitting: Family Medicine

## 2019-03-17 IMAGING — CT CT ABD-PELV W/ CM
2 of 5 series · 16 of 46 positions shown, 18 images · IV contrast (Isovue)
Comparison: 01/01/2015

CLINICAL DATA: Lower abdominal pain ,multiple loose stools daily.
Hx of pancreatitis,cholecystectomy,biliary stent placement.

EXAM:
CT ABDOMEN AND PELVIS WITH CONTRAST
TECHNIQUE: Multidetector CT imaging of the abdomen and pelvis was performed
using the standard protocol following bolus administration of
intravenous contrast.
CONTRAST:  100mL M7GHYE-899 IOPAMIDOL (M7GHYE-899) INJECTION 61%

[Series 2: axial st · axial · 0.64mm/px · z∈[-417,+8]mm · 13 of 97 slices shown, 15 images]
[im 6/97  soft-tissue]
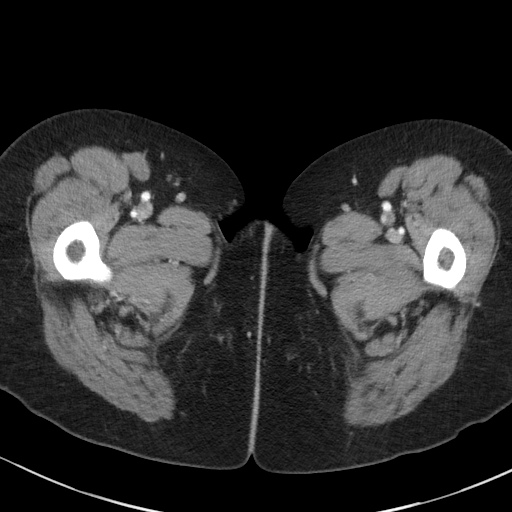
[im 6/97  bone]
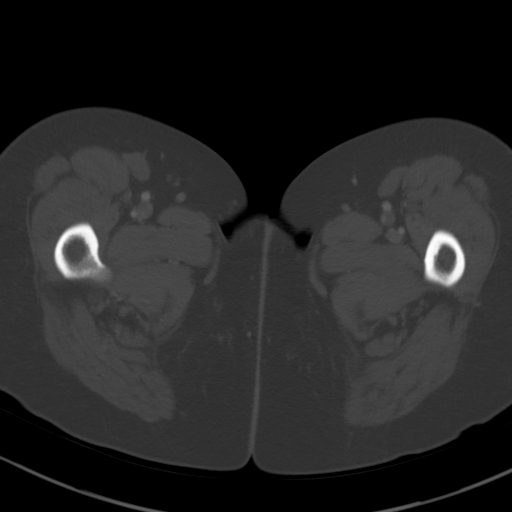
[im 12/97  soft-tissue]
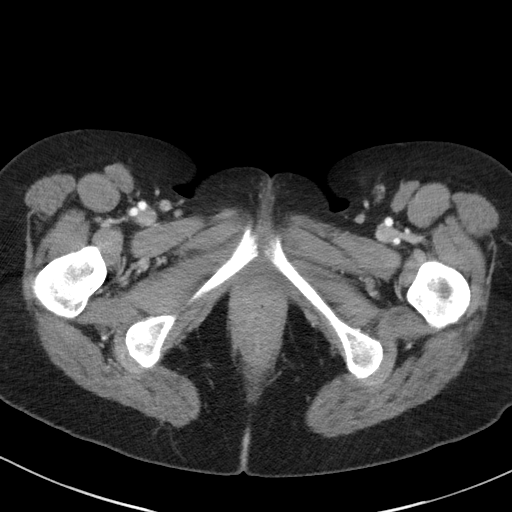
[im 23/97  soft-tissue]
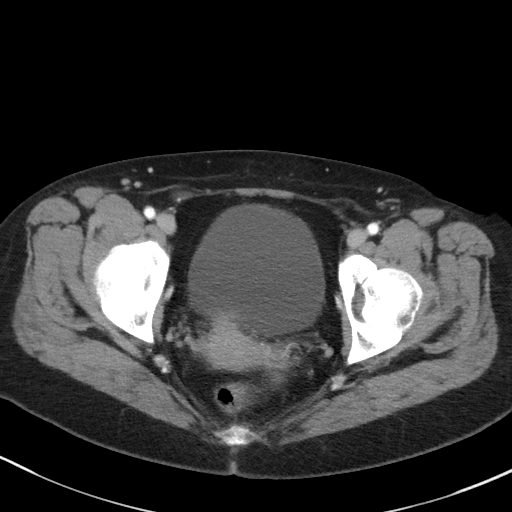
[im 29/97  soft-tissue]
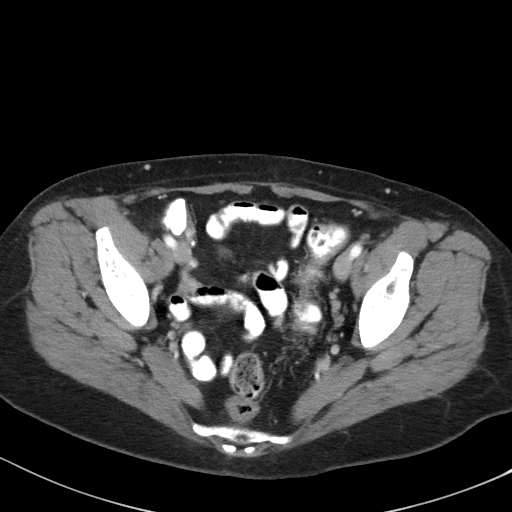
[im 34/97  soft-tissue]
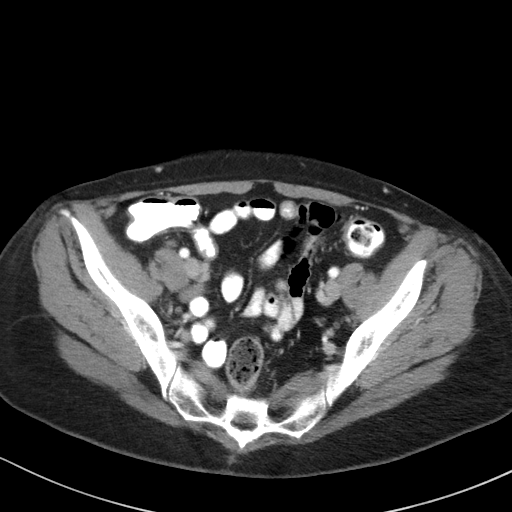
[im 40/97  soft-tissue]
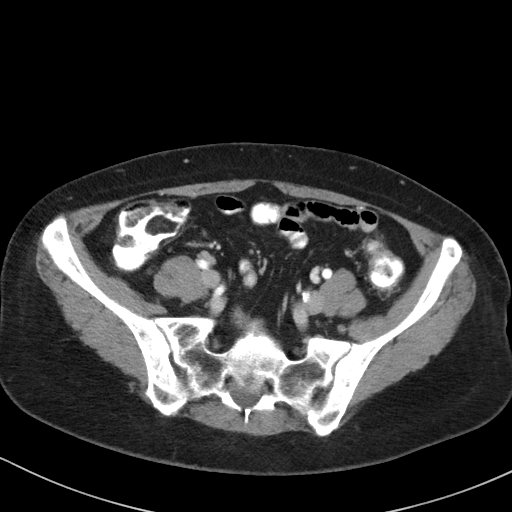
[im 51/97  soft-tissue]
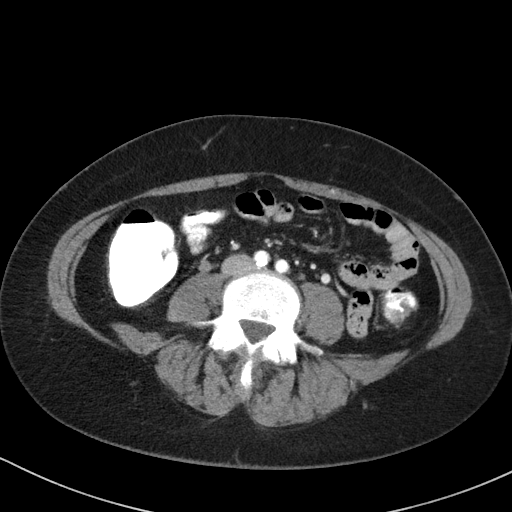
[im 57/97  soft-tissue]
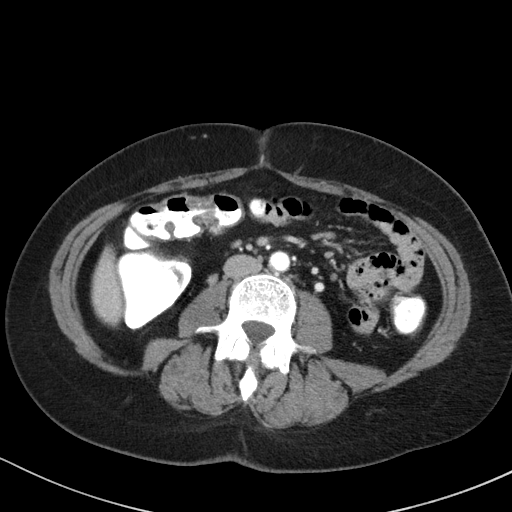
[im 63/97  soft-tissue]
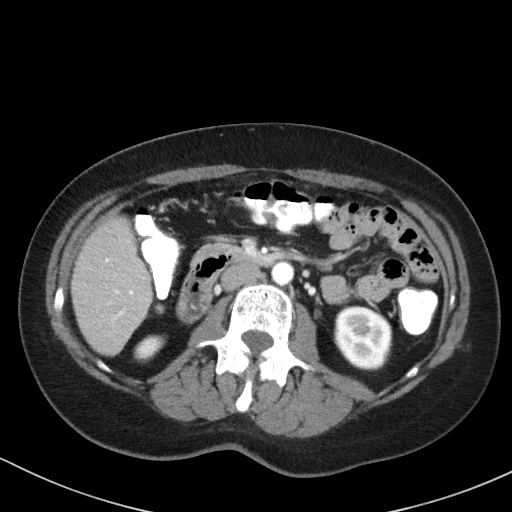
[im 63/97  bone]
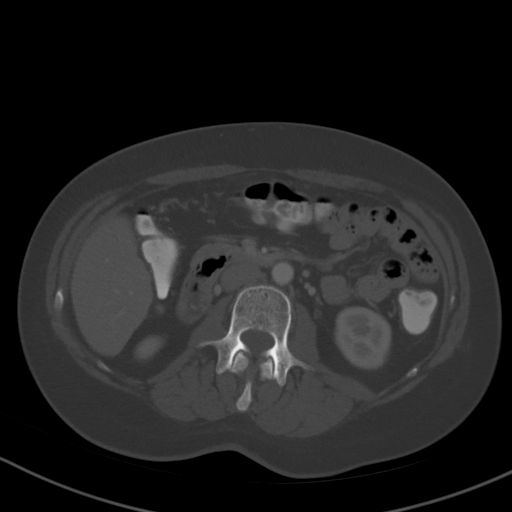
[im 68/97  soft-tissue]
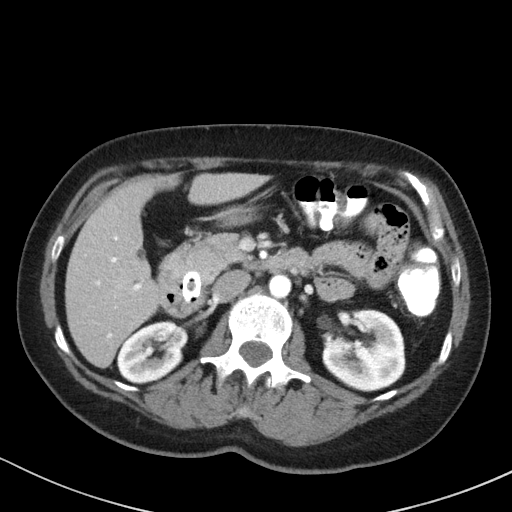
[im 74/97  soft-tissue]
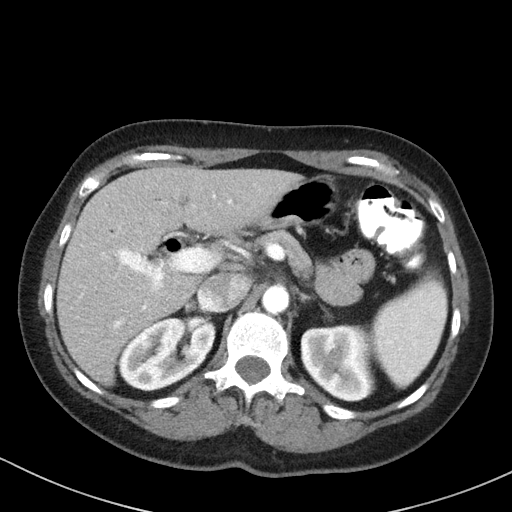
[im 85/97  soft-tissue]
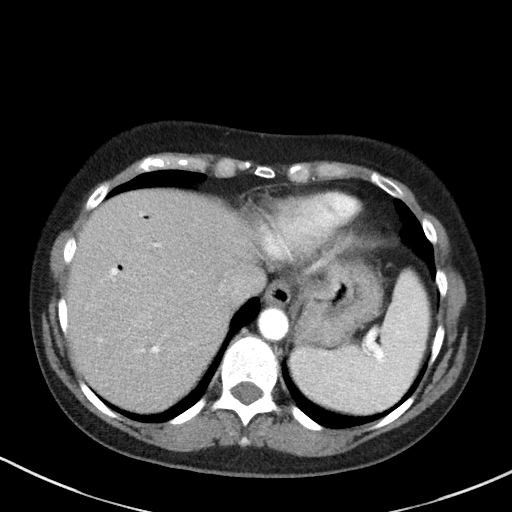
[im 91/97  soft-tissue]
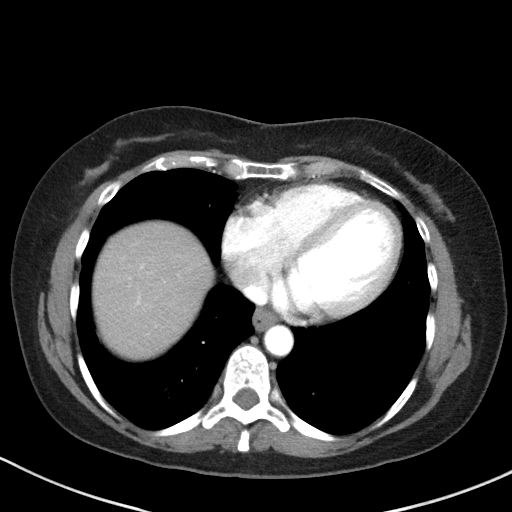

[Series 6: coronal st · coronal · 0.84mm/px · 3 of 69 slices shown]
[im 23/69  soft-tissue]
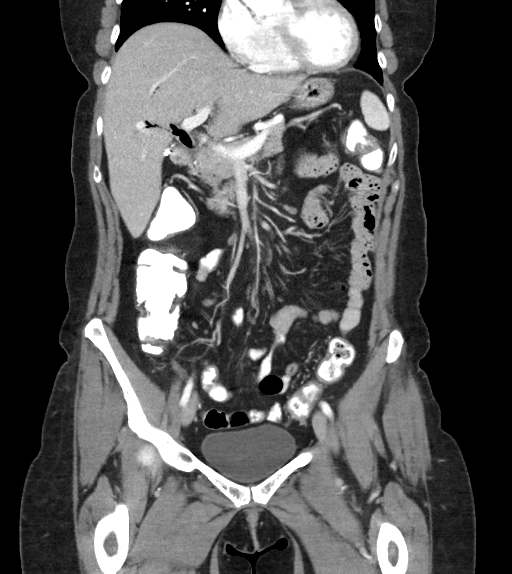
[im 31/69  soft-tissue]
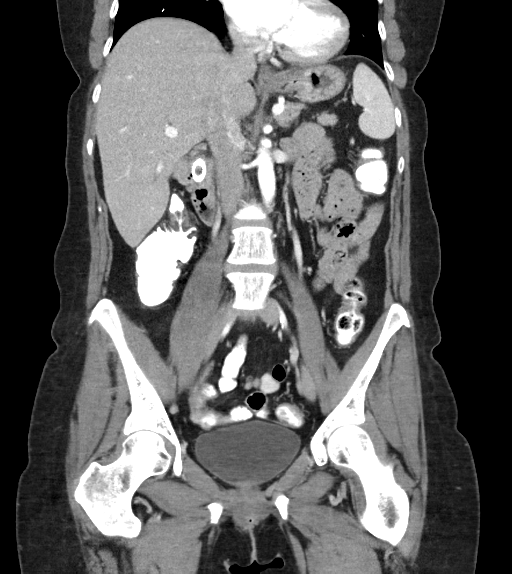
[im 38/69  soft-tissue]
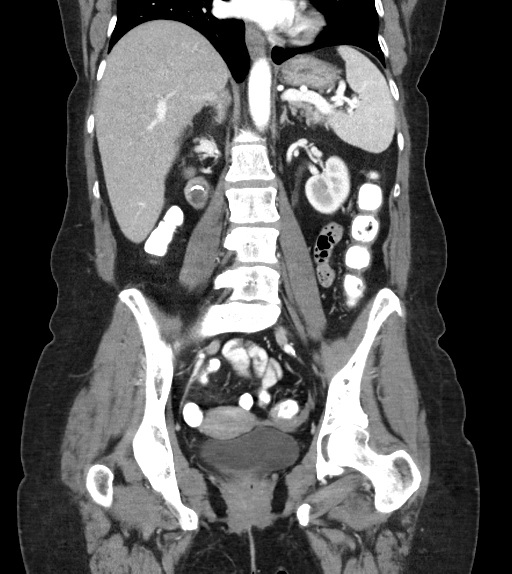

[16 of 46 positions shown; findings below may reference images not displayed]

FINDINGS: Lower chest: No acute abnormality.

Hepatobiliary: Liver is normal in size and attenuation. No mass or
focal lesion. Status post cholecystectomy. There is a stent across
the proximal common bile duct. Intrahepatic biliary air extends into
both liver lobes. These findings are stable.

Pancreas: Unremarkable. No pancreatic ductal dilatation or
surrounding inflammatory changes.

Spleen: Normal in size without focal abnormality.

Adrenals/Urinary Tract: Adrenal glands are unremarkable. Kidneys are
normal, without renal calculi, focal lesion, or hydronephrosis.
Bladder is unremarkable.

Stomach/Bowel: Stomach is within normal limits. Appendix appears
normal. No evidence of bowel wall thickening, distention, or
inflammatory changes.

Vascular/Lymphatic: Aortic atherosclerosis. No enlarged abdominal or
pelvic lymph nodes.

Reproductive: Uterus and bilateral adnexa are unremarkable.

Other: No abdominal wall hernia or abnormality. No abdominopelvic
ascites.

Musculoskeletal: No acute or significant osseous findings.
IMPRESSION: 1. No acute abnormalities within the abdomen or pelvis. No findings
to account for the patient's abdominal pain or loose stools.
2. Stable appearance of the common bile duct stent. Status post
cholecystectomy.
3. Mild aortic atherosclerosis.

## 2019-03-17 MED ORDER — FREESTYLE LIBRE 14 DAY READER
1.0000 | Freq: Three times a day (TID) | 2 refills | Status: DC
Start: 2019-03-17 — End: 2020-01-24

## 2019-03-17 MED ORDER — FREESTYLE LIBRE 14 DAY SENSOR KIT
1.0000 | PACK | Freq: Three times a day (TID) | 0 refills | Status: DC
Start: 2019-03-17 — End: 2020-01-24

## 2019-03-17 NOTE — Telephone Encounter (Signed)
Regarding: Dr. Russ Halo  ----- Message from Myna Hidalgo sent at 03/17/2019 12:39 PM EDT -----  Patient states that Dr. Russ Halo stated that he wanted her to get a [employer].  Patient stated that she checked with her Insurance and they are going to try and get it approved would like for it to be ordered for her.

## 2019-03-25 ENCOUNTER — Ambulatory Visit (HOSPITAL_BASED_OUTPATIENT_CLINIC_OR_DEPARTMENT_OTHER): Payer: Self-pay | Admitting: PHYSICIAN ASSISTANT

## 2019-03-25 NOTE — Nursing Note (Signed)
patient just had a refill on 3/24 patient is not supposed to be taking this medication everyday.    Monique [name]

## 2019-03-25 NOTE — Telephone Encounter (Signed)
Regarding: Dr. Russ Halo  ----- Message from Moses Manners sent at 03/24/2019  1:55 PM EDT -----  Patient requesting a 90 day refill sent to [facility], [place] - [address] AT [place] for:          #ondansetron (ZOFRAN ODT) 8 mg Oral Tablet, Rapid Dissolve, Take 1 Tab (8 mg total) by mouth Every 8 hours as needed for Nausea/Vomiting       Appt:  06/01/19

## 2019-03-29 ENCOUNTER — Other Ambulatory Visit (HOSPITAL_BASED_OUTPATIENT_CLINIC_OR_DEPARTMENT_OTHER): Payer: Self-pay | Admitting: Family Medicine

## 2019-03-29 DIAGNOSIS — R110 Nausea: Secondary | ICD-10-CM

## 2019-03-29 NOTE — Telephone Encounter (Signed)
Regarding: Dr. Russ Halo   ----- Message from Lacie Draft sent at 03/29/2019 11:24 AM EDT -----  Current Outpatient Medications:  ondansetron (ZOFRAN ODT) 8 mg Oral Tablet, Rapid Dissolve, Take 1 Tab (8 mg total) by mouth Every 8 hours as needed for Nausea/Vomiting  Wants 90 days not 30 sent to [facility] on [address]

## 2019-03-30 MED ORDER — ONDANSETRON 8 MG DISINTEGRATING TABLET
8.00 mg | ORAL_TABLET | Freq: Three times a day (TID) | ORAL | 0 refills | Status: DC | PRN
Start: 2019-03-30 — End: 2019-05-25

## 2019-04-03 ENCOUNTER — Other Ambulatory Visit (HOSPITAL_BASED_OUTPATIENT_CLINIC_OR_DEPARTMENT_OTHER): Payer: Self-pay | Admitting: Family Medicine

## 2019-04-03 DIAGNOSIS — E559 Vitamin D deficiency, unspecified: Secondary | ICD-10-CM

## 2019-05-25 ENCOUNTER — Other Ambulatory Visit (HOSPITAL_BASED_OUTPATIENT_CLINIC_OR_DEPARTMENT_OTHER): Payer: Self-pay | Admitting: PHYSICIAN ASSISTANT

## 2019-05-25 ENCOUNTER — Other Ambulatory Visit (HOSPITAL_BASED_OUTPATIENT_CLINIC_OR_DEPARTMENT_OTHER): Payer: Self-pay | Admitting: Family Medicine

## 2019-05-25 DIAGNOSIS — R110 Nausea: Secondary | ICD-10-CM

## 2019-05-25 MED ORDER — ONDANSETRON 8 MG DISINTEGRATING TABLET
8.00 mg | ORAL_TABLET | Freq: Three times a day (TID) | ORAL | 0 refills | Status: DC | PRN
Start: 2019-05-25 — End: 2019-06-01

## 2019-05-25 NOTE — Telephone Encounter (Signed)
Regarding: Dr. Russ Halo  ----- Message from Moses Manners sent at 05/25/2019  9:28 AM EDT -----  Patient requesting 90 day refill sent to [facility], [place] - [address] AT [place] for:          #ondansetron (ZOFRAN ODT) 8 mg Oral Tablet, Rapid Dissolve, Take 1 Tab (8 mg total) by mouth Every 8 hours as needed for Nausea/Vomiting         Appt:  06/01/19

## 2019-06-01 ENCOUNTER — Ambulatory Visit (INDEPENDENT_AMBULATORY_CARE_PROVIDER_SITE_OTHER): Payer: Commercial Managed Care - PPO | Admitting: Family Medicine

## 2019-06-01 ENCOUNTER — Other Ambulatory Visit: Payer: Self-pay

## 2019-06-01 ENCOUNTER — Encounter (HOSPITAL_BASED_OUTPATIENT_CLINIC_OR_DEPARTMENT_OTHER): Payer: Self-pay | Admitting: Family Medicine

## 2019-06-01 VITALS — BP 114/76 | HR 65 | Temp 97.5°F | Resp 14 | Ht 66.0 in | Wt 218.0 lb

## 2019-06-01 DIAGNOSIS — E538 Deficiency of other specified B group vitamins: Secondary | ICD-10-CM

## 2019-06-01 DIAGNOSIS — E559 Vitamin D deficiency, unspecified: Secondary | ICD-10-CM

## 2019-06-01 DIAGNOSIS — Z7984 Long term (current) use of oral hypoglycemic drugs: Secondary | ICD-10-CM

## 2019-06-01 DIAGNOSIS — E118 Type 2 diabetes mellitus with unspecified complications: Secondary | ICD-10-CM

## 2019-06-01 DIAGNOSIS — E1100 Type 2 diabetes mellitus with hyperosmolarity without nonketotic hyperglycemic-hyperosmolar coma (NKHHC): Secondary | ICD-10-CM

## 2019-06-01 DIAGNOSIS — K219 Gastro-esophageal reflux disease without esophagitis: Secondary | ICD-10-CM

## 2019-06-01 DIAGNOSIS — E119 Type 2 diabetes mellitus without complications: Principal | ICD-10-CM

## 2019-06-01 DIAGNOSIS — R110 Nausea: Secondary | ICD-10-CM

## 2019-06-01 LAB — POCT HGB A1C: POCT HGB A1C: 9 % — AB (ref 4–6)

## 2019-06-01 MED ORDER — OMEPRAZOLE 40 MG CAPSULE,DELAYED RELEASE
40.0000 mg | DELAYED_RELEASE_CAPSULE | Freq: Every day | ORAL | 3 refills | Status: DC
Start: 2019-06-01 — End: 2020-03-06

## 2019-06-01 MED ORDER — BYDUREON 2 MG SUBCUTANEOUS EXTENDED RELEASE SUSPENSION
2.00 mg | SUBCUTANEOUS | 3 refills | Status: DC
Start: 2019-06-01 — End: 2019-06-03

## 2019-06-01 MED ORDER — CYANOCOBALAMIN (VIT B-12) 1,000 MCG/ML INJECTION SOLUTION
1000.00 ug | INTRAMUSCULAR | 2 refills | Status: DC
Start: 2019-07-01 — End: 2019-12-05

## 2019-06-01 MED ORDER — ERGOCALCIFEROL (VITAMIN D2) 1,250 MCG (50,000 UNIT) CAPSULE
ORAL_CAPSULE | ORAL | 2 refills | Status: DC
Start: 2019-06-01 — End: 2021-01-03

## 2019-06-01 MED ORDER — ONDANSETRON 8 MG DISINTEGRATING TABLET
8.00 mg | ORAL_TABLET | Freq: Three times a day (TID) | ORAL | 0 refills | Status: DC | PRN
Start: 2019-06-01 — End: 2019-09-01

## 2019-06-01 MED ORDER — CYANOCOBALAMIN (VIT B-12) 1,000 MCG/ML INJECTION SOLUTION
1000.00 ug | INTRAMUSCULAR | 0 refills | Status: DC
Start: 2019-06-01 — End: 2019-07-11

## 2019-06-01 MED ORDER — FAMOTIDINE 20 MG TABLET
20.00 mg | ORAL_TABLET | Freq: Every evening | ORAL | 2 refills | Status: DC
Start: 2019-06-01 — End: 2019-09-12

## 2019-06-01 NOTE — Nursing Note (Signed)
06/01/19 1400   PHQ 9 (follow up)   Little interest or pleasure in doing things. 0   Feeling down, depressed, or hopeless 0   Trouble falling or staying asleep, or sleeping too much. 0   Feeling tired or having little energy 0   Poor appetite or overeating 0   Feeling bad about yourself/ that you are a failure in the past 2 weeks? 0   Trouble concentrating on things in the past 2 weeks? 0   Moving/Speaking slowly or being fidgety or restless  in the past 2 weeks? 0   Thoughts that you would be better off DEAD, or of hurting yourself in some way. 0   If you checked off any problems, how difficult have these problems made it for you to do your work, take care of things at home, or get along with other people? Not difficult at all   PHQ 9 Total 0   Interpretation of Total Score No depression

## 2019-06-01 NOTE — Progress Notes (Signed)
Patient Name: Monique [name]  Date: 06/01/2019  [facility], [place]  [place]  [place] [address]  [id number]  MRN: [id number]  DOB: 05/09/1966    Chief complaint: The patient is a 53 y.o. old female who came in today for Diabetes and Depression    HPI:    Story line:  Patient is significantly improved from a health standpoint with the loss of weight.  The status post bariatric surgery. Her A1c improvement is largely from her weight loss and dietary modifications.    Diabetes: Patient's last A1C on 10/05/2018 was 9.4%., today's is 9%.  Patient denies symptoms of polydipsia, polyuria, hypoglycemic unawareness, chest pain, dyspnea on exertion, diarrhea, foot ulcerations, paresthesias. Symptoms are not changed. Patient's home blood sugars are controlled. Patient is compliant with medications.    Depression: Complains of less depressed mood. Onset was approximately several years ago and clinical course has been stable since that time.  She denies current suicidal and homicidal plan or intent. Previous treatment includes SSRI. She denies side effects from the treatment. She does not need a refill today. Covid isolation has lead to worsening weight loss but not depression. She has stopped walking at the park.       ROS:  Constitutional: No fever, chills  Skin: No rashes or lesions  HENT: No sore throat, ear pain, or difficulty swallowing, positive for rhinorrhea attributes to allergic rhinitis.  Eyes: No vision changes, redness, discharge  Cardio: No chest pain, palpitations   Respiratory: No cough, wheezing or SOB  GI:  No nausea or vomiting. No diarrhea or constipation. No abdominal pain-reflux has returned/worsened.   GU:  No dysuria, hematuria, polyuria  MSK: No new joint pain.  No new neck or back pain  Neuro: No numbness, tingling, or weakness.  No headache  All other systems reviewed and are negative, unless commented on in the HPI.       Past medical  history:  Past Medical History:   Diagnosis Date   . Anxiety    . Carotid stenosis    . Chronic back pain    . Coronary artery disease    . DDD (degenerative disc disease), lumbar     "entire spine"   . Ear piercing    . GERD (gastroesophageal reflux disease)    . H/O complete eye exam     2 years, Dr. [name], at [facility]   . History of dental examination     dentures upper and lower   . HTN    . Hyperlipidemia    . MI (myocardial infarction) (CMS [place]) 2012   . Neck problem     herniated cervical disc   . Obesity (BMI 30-39.9)    . 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 [place] ordered.    . Stroke (CMS [facility])     2002   . Tattoo     Left breast, Right shoulder   . Type 2 diabetes mellitus (CMS HCC)      Past surgical history:  Past Surgical History:   Procedure Laterality Date   . ENDOMETRIAL ABLATION  1998   . HX ANKLE FRACTURE TX  1990s    Right, Doran Durand procedure   . HX CATARACT REMOVAL Bilateral 2013    r and l eyes with implants   . HX CESAREAN SECTION  06/20/2000    x3, 11/21/86, 11/10/84   .  HX CHOLECYSTECTOMY  1988   . HX CORONARY ARTERY BYPASS GRAFT  03/31/2011    cardiac, 2 vessel, Dr. [name], [facility]   . HX CORONARY STENT PLACEMENT  2012    x4 stents   . HX GASTRIC SLEEVE  02/2017    [place], [place], Dr. Pearlie Oyster   . HX HEART CATHETERIZATION  2012    massive heart attack and stents - [facility]   . HX THYROID BIOPSY     . HX THYROIDECTOMY  101/01/2018   . HX TONSILLECTOMY      as child   . HX YAG Left 09/20/2013     Medications:  Current Outpatient Medications   Medication Sig   . ACCU-CHEK AVIVA PLUS TEST STRP Strip 1 Strip by Subcutaneous route Three times a day as needed   . BYDUREON 2 mg Subcutaneous 2 mg by Subcutaneous route Every 7 days   . clopidogreL (PLAVIX) 75 mg Oral Tablet Take 1 Tab (75 mg total) by mouth Once a day   . cyanocobalamin (VITAMIN B-12) 1,000 mcg/mL Injection Solution 1 mL (1,000 mcg total) by IntraMUSCULAR route Every 30 days   .  [START ON 07/01/2019] cyanocobalamin (VITAMIN B12) 1,000 mcg/mL Injection Solution 1 mL (1,000 mcg total) by Subcutaneous route Every 30 days   . diclofenac sodium (VOLTAREN) 1 % Gel 2 g by Apply Topically route Three times a day as needed   . docusate sodium (COLACE) 100 mg Oral Capsule Take 1 Cap (100 mg total) by mouth Twice daily   . empagliflozin-linagliptin (GLYXAMBI) 10-5 mg Oral Tablet TAKE 1 TABLET BY MOUTH ONCE DAILY   . ergocalciferol, vitamin D2, (DRISDOL) 1,250 mcg (50,000 unit) Oral Capsule TAKE 1 CAPSULE BY MOUTH EVERY WEEK   . famotidine (PEPCID) 20 mg Oral Tablet Take 1 Tab (20 mg total) by mouth Every evening   . flash glucose scanning reader (FREESTYLE LIBRE 14 DAY READER) Does not apply Misc 1 Box by Does not apply route Three times a day   . flash glucose sensor (FREESTYLE LIBRE 14 DAY SENSOR) Does not apply Kit 1 Kit by Does not apply route Three times a day   . furosemide (LASIX) 40 mg Oral Tablet Take 1 Tab (40 mg total) by mouth Once a day   . HYDROcodone-acetaminophen (NORCO) 10-325 mg Oral Tablet Take 1 Tab by mouth Four times a day    . Ibuprofen (MOTRIN) 800 mg Oral Tablet Take 1 Tab (800 mg total) by mouth Three times a day as needed for Pain   . levothyroxine (SYNTHROID) 25 mcg Oral Tablet Take 25 mcg by mouth Once a day   . lisinopril (PRINIVIL) 2.5 mg Oral Tablet take 1 tablet by mouth once daily   . loratadine (CLARITIN) 10 mg Oral Tablet Take 1 Tab (10 mg total) by mouth Once a day   . metFORMIN (GLUCOPHAGE) 500 mg Oral Tablet take 1 tablet by mouth twice a day with food   . metoprolol succinate (TOPROL-XL) 25 mg Oral Tablet Sustained Release 24 hr TAKE 1/2 TABLET ONCE DAILY   . NARCAN 4 mg/actuation Nasal Spray, Non-Aerosol PT STATED SHE HAS NARCAN AT HOME BUT IS NOT TAKING IT   . niacin (NIASPAN) 500 mg Oral Tablet Sustained Release Take 1 Tab (500 mg total) by mouth Once a day   . nitroGLYCERIN (NITROSTAT) 0.4 mg Sublingual Tablet, Sublingual 1 Tab (0.4 mg total) by Sublingual  route Every 5 minutes as needed for Chest pain for 3 doses over 15 minutes   .  omeprazole (PRILOSEC) 40 mg Oral Capsule, Delayed Release(E.C.) Take 1 Cap (40 mg total) by mouth Once a day   . ondansetron (ZOFRAN ODT) 8 mg Oral Tablet, Rapid Dissolve Take 1 Tab (8 mg total) by mouth Every 8 hours as needed for Nausea/Vomiting   . PRENATAL PLUS, CALCIUM CARB, 27 mg iron- 1 mg Oral Tablet Take 1 Tab by mouth Once a day   . PROVENTIL HFA 90 mcg/actuation Inhalation HFA Aerosol Inhaler Take 1-2 Puffs by inhalation Every 4 hours as needed   . rosuvastatin (CRESTOR) 20 mg Oral Tablet Take 1 Tab (20 mg total) by mouth Once a day   . venlafaxine (EFFEXOR XR) 37.5 mg Oral Capsule, Sust. Release 24 hr Take 1 Cap (37.5 mg total) by mouth Once a day   . VENTOLIN HFA 90 mcg/actuation Inhalation HFA Aerosol Inhaler Take 1-2 Puffs by inhalation Every 6 hours as needed   . ZYRTEC-D 5-120 mg Oral Tablet Sustained Release 12 hr Take 1 Tab by mouth Once a day     Allergies:  Allergies   Allergen Reactions   . Ane Payment [Pyrilamine-Dextromethorphan]  Other Adverse Reaction (Add comment)     LIGHT HEADED   . Tylenol W Codeine [Acetaminophen-Codeine]  Other Adverse Reaction (Add comment)     pt states that one time she had chest pains with this med, but dr. thought it was because she had not eaten     Family history:  Family Medical History:     Problem Relation (Age of Onset)    Atrial fibrillation Sister    Colon Cancer Father    Diabetes Mother, Maternal Aunt    Heart Disease Sister, Maternal Grandfather    Hypertension (High Blood Pressure) Mother    Lung Cancer Maternal Grandfather    MI <56 years of age Mother        Social history:  Social History     Socioeconomic History   . Marital status: Married     Spouse name: [name]   . Number of children: 3   . Years of education: completed 11th grade   . Highest education level: Not on file   Occupational History   . Occupation: disabled   Social Needs   . Financial resource strain: Not  hard at all   . Food insecurity     Worry: Never true     Inability: Never true   . Transportation needs     Medical: No     Non-medical: No   Tobacco Use   . Smoking status: Current Every Day Smoker     Packs/day: 0.50     Years: 20.00     Pack years: 10.00     Types: Cigarettes     Start date: 20   . Smokeless tobacco: Never Used   Substance and Sexual Activity   . Alcohol use: No     Frequency: Never     Binge frequency: Never   . Drug use: No   . Sexual activity: Yes     Partners: Male   Lifestyle   . Physical activity     Days per week: 0 days     Minutes per session: 0 min   . Stress: To some extent   Relationships   . Social connections     Talks on phone: More than three times a week     Gets together: More than three times a week     Attends religious service: Never  Active member of club or organization: No     Attends meetings of clubs or organizations: Never     Relationship status: Married   . Intimate partner violence     Fear of current or ex partner: No     Emotionally abused: No     Physically abused: No     Forced sexual activity: No   Other Topics Concern   . Routine Exercise No   . Ability to Walk 2 Flight of Steps without SOB/CP Yes   Social History Narrative    Living situation: lives at home with husband, [name], and daughter.  1 dog named Duke and 1 cat named Lucky.    Nutrition:  Eats 5 small meals d/t gastric sleeve surgery.     Caffeine use: none, decaf coffee    Exercise: stays busy around house. No formal form of exercise    Seatbelt use: sometimes    Fire extinguishers in the home: yes    Smoke alarms in the home: yes    Carbon monoxide detectors in the home: yes    Nancy Fetter exposure: sunglasses        [place] would like for husband, Quamesha Mullet,  to speak for her in the event she would become incapacitated.    Full code.      Vitals:    06/01/19 1422   BP: 114/76   Pulse: 65   Resp: 14   Temp: 36.4 C (97.5 F)   TempSrc: Thermal Scan   SpO2: 94%   Weight: 98.9 kg (218 lb)   Height:  1.676 m (_0 )   BMI: 35.26           Physical Examination:    GENERAL:   Pt is a pleasant, well-nourished, well-developed 53 y.o. female who is in NAD. Appears stated age  75:  head normocephalic, symmetrical facies. EOM intact b/l. PERRLA. Sclera non-icteric, non-injected. upper eyelid w/Xythoma-lipid plaques, diminished in fullness and intensity.  No rhinorrhea. Oropharyngeal mucous membranes are moist  w/o erythema/exudates   NECK:  Previously noted hypertonicity and decreased range of motion cervical spine not observed today.  No masses, no lymphadenopathy, no JVD on exam. Trachea midline, no thyromegaly   CV: S1, S2. No murmurs, rubs, gallops.     LUNGS: CTAB, No rhonchi, rales, wheezes.   GI: (+) BS in all 4 quadrants. soft, NT/ND. No rigidity/ guarding/ rebound. No organomegaly/masses, or abdominal bruits  MSK: Spontaneous normal AROM of major joints as observed during exam. No joint effusions/ swelling/ deformities.   No BLE edema, clubbing, or cyanosis.   2+ radial pulse present b/l.   + 2 reflexes Brachioradialis and patellar bilaterally.   Gait normal.  SKIN: No significant lesions, rashes, ecchymoses noted.   NEURO: AAOx4, CN grossly intact. Sensation intact b/l. No focal deficits.  PSYCH: Mood, behavior and affect normal w/ Intact judgement & insight    Diabetic foot exam   Both feet without edema or ulcerations. Pulses normal bilaterally. Sensation normal bilaterally Monofilament testing at 7 sites on the sole and 2 sites on the dorsum intact bilaterally Shoes inspected without evidence of abnormal wear or cause for callous   Vibratory testing bilaterally symmetric and extinguishes at 8 sec.       PHQ Questionnaire  Little interest or pleasure in doing things.: Not at all  Feeling down, depressed, or hopeless: Not at all  PHQ 2 Total: 0  Trouble falling or staying asleep, or sleeping too much.: Not at all  Feeling tired or having little energy: Not at all  Poor appetite or overeating: Not at  all  Feeling bad about yourself/ that you are a failure in the past 2 weeks?: Not at all  Trouble concentrating on things in the past 2 weeks?: Not at all  Moving/Speaking slowly or being fidgety or restless  in the past 2 weeks?: Not at all  Thoughts that you would be better off DEAD, or of hurting yourself in some way.: Not at all  If you checked off any problems, how difficult have these problems made it for you to do your work, take care of things at home, or get along with other people?: Not difficult at all  PHQ 9 Total: 0  Interpretation of Total Score: 0-4 No depression      Diabetes Monitors  A1C: 9  A1C Date: 06/01/2019           Nephropathy Screening: On ACEI or ARB    Last Lipid Panel  (Last result in the past 2 years)      Cholesterol   HDL   LDL   Direct LDL   Triglycerides      03/29/18 0958 134  Comment:  Total Cholesterol Classification:     <200        Desirable        200-239     Borderline High        > or = 240  High 52  Comment:  HDL Cholesterol Classification:          <40        Low        > or = 60  High   52  Comment:  LDL Cholesterol Classification:         <100     Optimal       100-129  Near or Above Optimal       130-159  Borderline High       160-189  High       >190     Very High   151  Comment:  Triglyceride Classification:            <150    Normal       150-199 Borderline-High       200-499 High       >500    Very High        Retinal Exam Date: 02/01/2019  Last diabetic foot exam: Not Found    Visit Diagnosis    Encounter Diagnoses   Name Primary?   . Type 2 diabetes mellitus (CMS [place]) Yes   . Nausea    . Gastroesophageal reflux disease, esophagitis presence not specified    . Vitamin D deficiency    . B12 deficiency    . Type 2 diabetes mellitus with complication, without long-term current use of insulin (CMS HCC)        Orders Placed This Encounter   . POCT HGB A1C   . ondansetron (ZOFRAN ODT) 8 mg Oral Tablet, Rapid Dissolve   . omeprazole (PRILOSEC) 40 mg Oral Capsule, Delayed  Release(E.C.)   . ergocalciferol, vitamin D2, (DRISDOL) 1,250 mcg (50,000 unit) Oral Capsule   . cyanocobalamin (VITAMIN B12) 1,000 mcg/mL Injection Solution   . BYDUREON 2 mg Subcutaneous   . cyanocobalamin (VITAMIN B-12) 1,000 mcg/mL Injection Solution   . famotidine (PEPCID) 20 mg Oral Tablet       Type 2 diabetes mellitus with complication, without long-term  current use of insulin (CMS HCC)  Chronic, improved control  HbA1C now return to 9.0% down from 9.4%.  She has been more adherent with diet and exercise.  Continue weight loss.  Continue metformin 500 mg BID.   Continue Bydureon 2 mg every 7 days.    Continue Glyxambi 10-5 mg.    Encouraged her to check blood sugars 3-4 times daily and make a log for [place] to review at her next appointment.   Discuss renal concerns and need for monitoring.     Depression, unspecified depression type  Chronic, controlled.  Continue Effexor    Weight loss:  Loss has stalled.   She offers to restart Adipex, I decline.   I offer for her to return to walking.     GERD  Chronic, Uncontrolled problem   Encouraged lifestyle modification to help with acid reflux, including:   Weight loss   Avoid eating for 2-3 hours prior to bedtime. Avoid late night snacking.    Avoid hot/spicy and acidic foods and overeating.    Remain upright after eating, avoid tight clothing.    Elevate head of bed.   Chew gum or lozenges.   Refrain from alcohol and smoking.   Continue histamine blocker.  Continue  proton pump inhibitor.      Return in about 3 months (around 09/01/2019), or if symptoms worsen or fail to improve, for DM, Obesity.    Cherly Beach, MD  06/01/2019, 14:56    This note was partially generated using MModal Fluency Direct system, and there may be some incorrect words, spellings, and punctuation that were not noted in checking the note before saving.

## 2019-06-01 NOTE — Nursing Note (Signed)
06/01/19 1400   B12 Injection flowsheet   Medication Vitamin B12   Dose (mcg) 1,000 mcg   Site Left Deltoid   Route IM   Lot # [id number]   Expiration date 07/06/20   Manufacturer [employer]   NDC # [phone]   Initials CG   Who supplied B12 Clinic supplied

## 2019-06-01 NOTE — Nursing Note (Signed)
06/01/19 1400   A1C   Time Performed 1429   A1C 9.0

## 2019-06-03 ENCOUNTER — Other Ambulatory Visit (HOSPITAL_BASED_OUTPATIENT_CLINIC_OR_DEPARTMENT_OTHER): Payer: Self-pay | Admitting: Family Medicine

## 2019-06-03 DIAGNOSIS — E118 Type 2 diabetes mellitus with unspecified complications: Secondary | ICD-10-CM

## 2019-06-03 MED ORDER — BYDUREON 2 MG SUBCUTANEOUS EXTENDED RELEASE SUSPENSION
2.00 mg | SUBCUTANEOUS | 3 refills | Status: DC
Start: 2019-06-03 — End: 2020-07-04

## 2019-06-10 ENCOUNTER — Other Ambulatory Visit (HOSPITAL_BASED_OUTPATIENT_CLINIC_OR_DEPARTMENT_OTHER): Payer: Self-pay | Admitting: CARDIOVASCULAR DISEASE

## 2019-06-13 NOTE — Telephone Encounter (Signed)
Defer to PCP, patient has not been seen in office since September 2018

## 2019-06-14 ENCOUNTER — Other Ambulatory Visit (HOSPITAL_BASED_OUTPATIENT_CLINIC_OR_DEPARTMENT_OTHER): Payer: Self-pay | Admitting: Family Medicine

## 2019-06-14 DIAGNOSIS — R52 Pain, unspecified: Secondary | ICD-10-CM

## 2019-06-14 DIAGNOSIS — E119 Type 2 diabetes mellitus without complications: Secondary | ICD-10-CM

## 2019-06-14 MED ORDER — LISINOPRIL 2.5 MG TABLET
ORAL_TABLET | ORAL | 1 refills | Status: DC
Start: 2019-06-14 — End: 2019-12-07

## 2019-06-14 MED ORDER — ACCU-CHEK AVIVA PLUS TEST STRIPS
1.00 | ORAL_STRIP | Freq: Three times a day (TID) | 2 refills | Status: DC | PRN
Start: 2019-06-14 — End: 2019-12-28

## 2019-06-14 MED ORDER — BLOOD-GLUCOSE METER
1.0000 | Freq: Three times a day (TID) | 2 refills | Status: DC
Start: 2019-06-14 — End: 2021-10-22

## 2019-06-14 NOTE — Telephone Encounter (Signed)
Regarding: Dr. Russ Halo  ----- Message from Myna Hidalgo sent at 06/14/2019 12:26 PM EDT -----  Helene Kelp is needing a New Meter to Test her sugar cannot afford the [facility].

## 2019-07-11 ENCOUNTER — Other Ambulatory Visit (HOSPITAL_BASED_OUTPATIENT_CLINIC_OR_DEPARTMENT_OTHER): Payer: Self-pay | Admitting: Family Medicine

## 2019-07-11 DIAGNOSIS — E538 Deficiency of other specified B group vitamins: Secondary | ICD-10-CM

## 2019-07-20 ENCOUNTER — Encounter (INDEPENDENT_AMBULATORY_CARE_PROVIDER_SITE_OTHER): Payer: Self-pay | Admitting: Student in an Organized Health Care Education/Training Program

## 2019-07-20 NOTE — Nursing Note (Signed)
Monique [name] Controlled Substance Full [name] Report Report Date 07/20/2019   From 07/19/2018 To 07/19/2019 Date of Birth March 21, 1966 Prescription Count 19   Last [name] SENG First [name] Monique [name]                   Patients included in report that appear to match the search criteria.   Last [name] First [name] Middle [name] Gender Address   Monique [name] [address] , [place], [place], [address]         M.E.D.D. Morphine Equivalent Daily Dose  [place] CSAPP/RxDataTrack Patient Reports  This is an "Active Cumulative Morphine Equivalent" score added to patient reports, when the patient has active prescriptions for opioids. This feature is meant to readily identify the potency comparison among different prescribed opioids as a single score describing the equivalent dose of morphine available to be taken on a daily basis. It is a guide ONLY.    According to CDC, a score of more than 50 requires caution and a score of 90 or more indicates a significant increase in the risk for an overdose. ACTIVE MORPHINE EQUIVALENT      30   This table shows the prescriptions used to compute the MEDD score.   MEDD Score Calculator   RX# Drug Strength Unit  Multiplier  Quantity  Days  MEDD   Monique [name]   [id number] Hydrocodone Bitartrate And Acetaminophen 10 mg x 1.00 x 90 / 30 = 30   Cumulative Active Morphine Equivalent                           30   It does not reflect any information on the use of any other type of controlled substance such as benzodiazepines, stimulants, or sedatives. if a patient does not have an active prescription for an opiate no score will be visible on the report.         Prescriber [name] Prescriber Barnard [name] [place] Zip Rx Written Date Rx Dispense Date  & Date Sold   Rx Number Product [name] MEDD Status Strength Qty Days # of Refill Sched Payment Type   Monique [name] [address], [place], [address]   Monique [name] [id number] [place]. [id number]  [id number] 07/12/2019 07/17/2019 07/17/2019 [id number] Hydrocodone Bitartrate And Acetaminophen ACTIVE 10-325 MG 90 30 0/0 CII [address]   Monique [name] [id number] [place] [id number] [address]202 04/27/2019 06/18/2019 06/18/2019 [id number] Hydrocodone Bitartrate And Acetaminophen INACTIVE 10-325 MG 90 30 0/0 CII [address]   Monique [name] [id number] [place] [id number] 04/27/2019 05/20/2019 05/20/2019 [id number] Hydrocodone Bitartrate And Acetaminophen INACTIVE 10-325 MG 90 30 0/0 CII [address]   Monique [name] [id number] [place] [id number] 03/02/2019 04/22/2019 04/22/2019 [id number] Hydrocodone Bitartrate And Acetaminophen INACTIVE 10-325 MG 90 30 0/0 CII [address]   Monique [name] [id number] [place]. [id number] 03/02/2019 03/24/2019 03/24/2019 [id number] Hydrocodone Bitartrate And Acetaminophen INACTIVE 10-325 MG 90 30 0/0 CII [address]   [place], [place], [place] [id number] 03/01/2019 03/02/2019 03/02/2019 [id number] Zyrtec-D INACTIVE 5 12 12  [address]   Monique [name] [id number] [place] [id number] [id number] 12/29/2018 02/20/2019 02/20/2019 [id number] Hydrocodone Bitartrate And Acetaminophen INACTIVE 10-325 MG 120 30 0/0 CII Medicare   Monique [name] [id number] [place]. [id number] 12/29/2018 01/22/2019 01/22/2019 [id number] Hydrocodone Bitartrate And Acetaminophen  INACTIVE 10-325 MG 120 30 0/0 CII Medicare   Monique [name] [id number] [place]. [id number] 01/18/2019 01/20/2019 01/21/2019 [id number] Narcan INACTIVE 4 MG/.1ML 2 1 0/0 CIV Medicare   Monique [name] [id number] [place]. [id number] 11/12/2018 12/23/2018 12/24/2018 [id number] Hydrocodone Bitartrate And Acetaminophen INACTIVE 10-325 MG 120 30 0/0 CII Medicare   Monique [name] [id number] [place]. [id number] [id number] 12/10/2018 12/10/2018 12/10/2018 [id number] Pseudoephedrine INACTIVE  30 15 0/0 CV [address]   Monique [name] [id number]  [place] [id number] 11/23/2018 11/24/2018 11/24/2018 [id number] Hydrocodone Bitartrate And Acetaminophen INACTIVE 10-325 MG 120 30 0/0 CII Medicare   Monique [name] [id number] [place]. [id number] [id number] 11/11/2018 11/12/2018 11/12/2018 [id number] Pseudoephedrine INACTIVE  30 15 0/0 CV Insurance   Monique [name] [id number] [place] [id number] 09/08/2018 10/26/2018 10/26/2018 [id number] Hydrocodone Bitartrate And Acetaminophen INACTIVE 10-325 MG 120 30 0/0 CII Medicare   Monique [name] [id number] [place] [id number] 10/05/2018 10/08/2018 10/08/2018 [id number] Phentermine Hydrochloride INACTIVE 37.5MG          30 30 0/0 CIV Insurance   Monique [name] [id number] [place] [id number] 09/08/2018 09/27/2018 09/27/2018 [id number] Hydrocodone Bitartrate And Acetaminophen INACTIVE 10-325 MG 120 30 0/0 CII Medicare   Monique [name] [id number] [place] [id number] 09/08/2018 09/08/2018 09/08/2018 [id number] Apap/Oxycodone INACTIVE 325 MG-5 MG 28 7 0/0 CII Medicare   Monique [name] [id number]  [place] [id number] 07/29/2018 08/28/2018 08/28/2018 [id number] Hydrocodone Bitartrate And Acetaminophen INACTIVE 10-325 MG 120 30 0/0 CII Medicare   Monique [name] [id number]  [place] [id number] 07/29/2018 07/30/2018 07/30/2018 [id number] Hydrocodone Bitartrate And Acetaminophen INACTIVE 10-325 MG 120 30 0/0 CII Medicare

## 2019-07-24 ENCOUNTER — Other Ambulatory Visit: Payer: Self-pay

## 2019-07-24 ENCOUNTER — Encounter (HOSPITAL_COMMUNITY): Payer: Self-pay | Admitting: Emergency Medicine

## 2019-07-24 ENCOUNTER — Emergency Department (HOSPITAL_COMMUNITY): Payer: Self-pay

## 2019-07-24 ENCOUNTER — Emergency Department (HOSPITAL_COMMUNITY)
Admission: EM | Admit: 2019-07-24 | Discharge: 2019-07-24 | Disposition: A | Discharge status: Home / Self Care | Payer: Self-pay | Attending: Emergency Medicine | Admitting: Emergency Medicine

## 2019-07-24 DIAGNOSIS — R5383 Other fatigue: Secondary | ICD-10-CM | POA: Insufficient documentation

## 2019-07-24 DIAGNOSIS — Z87891 Personal history of nicotine dependence: Secondary | ICD-10-CM | POA: Insufficient documentation

## 2019-07-24 DIAGNOSIS — R0602 Shortness of breath: Secondary | ICD-10-CM | POA: Insufficient documentation

## 2019-07-24 DIAGNOSIS — R110 Nausea: Secondary | ICD-10-CM | POA: Insufficient documentation

## 2019-07-24 DIAGNOSIS — R072 Precordial pain: Secondary | ICD-10-CM | POA: Insufficient documentation

## 2019-07-24 LAB — CBC
HCT: 39 % (ref 36.0–46.0)
Hemoglobin: 12.8 g/dL (ref 12.0–15.0)
MCH: 28.1 pg (ref 26.0–34.0)
MCHC: 32.8 g/dL (ref 30.0–36.0)
MCV: 85.5 fL (ref 80.0–100.0)
Platelets: 213 10*3/uL (ref 150–400)
RBC: 4.56 MIL/uL (ref 3.87–5.11)
RDW: 12.7 % (ref 11.5–15.5)
WBC: 8 10*3/uL (ref 4.0–10.5)
nRBC: 0 % (ref 0.0–0.2)

## 2019-07-24 LAB — BASIC METABOLIC PANEL
Anion gap: 4 — ABNORMAL LOW (ref 5–15)
BUN: 13 mg/dL (ref 6–20)
CO2: 28 mmol/L (ref 22–32)
Calcium: 9.1 mg/dL (ref 8.9–10.3)
Chloride: 109 mmol/L (ref 98–111)
Creatinine, Ser: 0.7 mg/dL (ref 0.44–1.00)
GFR calc Af Amer: >60 mL/min (ref >60)
GFR calc non Af Amer: >60 mL/min (ref >60)
Glucose, Bld: 96 mg/dL (ref 70–99)
Potassium: 3.8 mmol/L (ref 3.5–5.1)
Sodium: 141 mmol/L (ref 135–145)

## 2019-07-24 LAB — TROPONIN I (HIGH SENSITIVITY)
Troponin I (High Sensitivity): <2 ng/L (ref <18)
Troponin I (High Sensitivity): <2 ng/L (ref <18)

## 2019-07-24 MED ORDER — SODIUM CHLORIDE 0.9% FLUSH: 3.0000 mL | Freq: Once | INTRAVENOUS | Status: DC

## 2019-07-24 NOTE — ED Notes (Signed)
ED Provider at bedside. 

## 2019-07-24 NOTE — Discharge Instructions (Addendum)
Increase your Dexilant to twice a day.  Follow-up with your primary provider in 2 to 3 days for recheck.  I have also provided referral information for the cardiology office here in [place].  Contact their office to arrange a follow-up appointment for possible repeat stress test.  Return to the ER for any worsening symptoms

## 2019-07-24 NOTE — ED Triage Notes (Signed)
Patient c/o intermittent mid-sternal chest pain that started a week ago. Patient states pain radiates into left chest. Patient states shortness of breath, nausea, indigestion, weakness, and dizziness.  Patient states started in her upper back but is now more in her chest. Denies any cardiac hx. Patient does report having a brief LOC in which she fell  x2 weeks ago. Patient states she reported it to her PCP but was told that it was probably related to stress.

## 2019-07-24 NOTE — ED Notes (Signed)
Pt ambulatory to bathroom.  Tried to get a urine sample but was unsuccessful. States will try again later

## 2019-07-24 NOTE — ED Provider Notes (Signed)
[facility] EMERGENCY DEPARTMENT Provider Note   CSN: [id number] Arrival date & time: 07/24/19  1517     History   Chief Complaint Chief Complaint  Patient presents with  . Chest Pain    HPI Ann [name] is a 53 y.o. female.     HPI   Ann [name] is a 53 y.o. female with a past medical history of chronic back pain, anxiety, pancreatitis, and GERD.  She presents to the Emergency Department complaining of a one-week history of intermittent substernal chest pain a gradual onset.  Pain began in her mid upper back and she describes this pain as sharp and constant.  She later developed lower substernal chest pain that radiates to her right upper chest.  Pain is been associated with nausea, fatigue, and shortness of breath. She had one episode of vomiting. Pain is also aggravated by food intake.  She believes pain may be associated with increased GERD.  She states she had a stress test several years ago in [place] without acute findings.  No previous cardiac history.  She is a non-smoker.  She also endorses increased stressors of recent.  She denies fever, chills, abdominal pain, persistent vomiting or bowel changes.  No lower extremity edema.    Past Medical History:  Diagnosis Date  . Anxiety   . Arthritis   . Chronic back pain   . Complication of anesthesia    pt states, "i have migraine and high BP after surgery.".  Marland Kitchen Depression   . GERD (gastroesophageal reflux disease)   . Migraines   . Pancreatitis     Patient Active Problem List   Diagnosis Date Noted  . Chronic diarrhea 12/28/2017  . GERD (gastroesophageal reflux disease) 12/28/2017  . Neck pain 02/12/2016  . Paresthesias 02/12/2016  . Vision changes 02/12/2016  . History of cervical spine trauma 02/12/2016  . Urinary incontinence 02/12/2016  . Acute pancreatitis 11/24/2014  . Chronic abdominal pain 11/24/2014    Past Surgical History:  Procedure Laterality Date  . BILE DUCT STENT PLACEMENT  07/2011   Dr.  Patrick North: fully covered metal wall stent placed.   . CHOLECYSTECTOMY  2010   with bile leak  . [facility]  . COLONOSCOPY WITH PROPOFOL N/A 03/02/2018   two 2-3 mm hyperplastic polyps, torturous left colon, mild diverticulosis in rectosigmoid, internal hemorrhoids, colonic biopsies negative  . ERCP  2011-2013   multiple, with multiple stent placements/exchanges. Metal wall stent placed in Aug 2012 AND STILL PRESENT AS OF 2019  . ESOPHAGOGASTRODUODENOSCOPY (EGD) WITH PROPOFOL N/A 03/02/2018   dysphagia due to benign-appearing esophageal stricture/GERD, s/p dilation, small hiatal hernia, gastritis.   Marland Kitchen SAVORY DILATION N/A 03/02/2018   Procedure: SAVORY DILATION;  Surgeon: Danie Binder, MD;  Location: [facility] ENDO SUITE;  Service: Endoscopy;  Laterality: N/A;     OB History    Gravida      Para      Term      Preterm      AB      Living  2     SAB      TAB      Ectopic      Multiple      Live Births               Home Medications    Prior to Admission medications   Medication Sig Start Date End Date Taking? Authorizing Provider  ALPRAZolam Duanne Moron) 1 MG tablet Take  0.5-1 mg by mouth 2 (two) times daily as needed for anxiety or sleep. 01/07/18   [provider]  dexlansoprazole (DEXILANT) 60 MG capsule Take 1 capsule (60 mg total) by mouth daily. 03/08/18   Gelene MinkBoone, Anna W, NP  dicyclomine (BENTYL) 10 MG capsule 1 PO 30 MINUTES PRIOR TO BREAKFAST AND LUNCH Patient not taking: Reported on 03/22/2018 03/02/18   West BaliFields, Sandi L, MD  hydroxypropyl methylcellulose / hypromellose (ISOPTO TEARS / GONIOVISC) 2.5 % ophthalmic solution Place 1-2 drops into both eyes 3 (three) times daily as needed for dry eyes.    [provider]  ibuprofen (ADVIL,MOTRIN) 800 MG tablet Take 800 mg by mouth every 8 (eight) hours as needed (for joint pain.).     [provider]  lipase/protease/amylase (CREON) 36000 UNITS CPEP capsule Take 2 capsules  (72,000 Units total) by mouth 3 (three) times daily with meals. 1 with snacks 03/22/18   Gelene MinkBoone, Anna W, NP  Melatonin 5 MG TABS Take 5 mg by mouth at bedtime as needed (for sleep.).     [provider]    Family History Family History  Problem Relation Age of Onset  . Cervical cancer Mother   . Heart attack Other   . Migraines Neg Hx   . Seizures Neg Hx   . Dementia Neg Hx   . Colon cancer Neg Hx     Social History Social History   Tobacco Use  . Smoking status: Former Smoker    Packs/day: 0.25    Years: 10.00    Pack years: 2.50    Types: Cigarettes    Quit date: 12/09/2011    Years since quitting: 7.6  . Smokeless tobacco: Never Used  Substance Use Topics  . Alcohol use: No  . Drug use: No     Allergies   Imitrex [sumatriptan], Cymbalta [duloxetine hcl], Dicyclomine, Doxycycline, Gabapentin, Lyrica [pregabalin], Nucynta [tapentadol], Nuvigil [armodafinil], Other, Penicillins, Amoxicillin-pot clavulanate, Butrans [buprenorphine], and Ciprofloxacin   Review of Systems Review of Systems  Constitutional: Negative for appetite change, chills, fatigue and fever.  Respiratory: Positive for shortness of breath. Negative for chest tightness.   Cardiovascular: Positive for chest pain. Negative for leg swelling.  Gastrointestinal: Positive for vomiting. Negative for abdominal pain, constipation, diarrhea and nausea.  Genitourinary: Negative for dysuria and flank pain.  Musculoskeletal: Negative for arthralgias and back pain.  Skin: Negative for rash.  Neurological: Negative for syncope, weakness, light-headedness and numbness.     Physical Exam Updated Vital Signs BP (!) 158/90 (BP Location: Right Arm)   Pulse 84   Temp 98.4 F (36.9 C) (Oral)   Resp 18   Ht 5\' 6"  (1.676 m)   Wt 59 kg   LMP 05/16/2012   SpO2 99%   BMI 20.98 kg/m   Physical Exam Vitals signs and nursing note reviewed.  Constitutional:      General: She is not in acute distress.     Appearance: She is well-developed. She is not ill-appearing or toxic-appearing.  HENT:     Head: Atraumatic.     Mouth/Throat:     Mouth: Mucous membranes are moist.     Pharynx: Oropharynx is clear.  Neck:     Musculoskeletal: Normal range of motion.  Cardiovascular:     Rate and Rhythm: Normal rate and regular rhythm.     Pulses: Normal pulses.  Pulmonary:     Effort: Pulmonary effort is normal.     Breath sounds: Normal breath sounds.  Chest:  Chest wall: No tenderness.  Abdominal:     General: There is no distension.     Palpations: Abdomen is soft.     Tenderness: There is no abdominal tenderness. There is no guarding.  Musculoskeletal: Normal range of motion.     Left lower leg: No edema.  Skin:    General: Skin is warm.     Capillary Refill: Capillary refill takes less than 2 seconds.     Findings: No rash.  Neurological:     General: No focal deficit present.     Mental Status: She is alert.     Sensory: Sensation is intact. No sensory deficit.     Motor: Motor function is intact. No weakness.     Comments: CN II-XII grossly intact.  Speech clear.       ED Treatments / Results  Labs (all labs ordered are listed, but only abnormal results are displayed) Labs Reviewed  BASIC METABOLIC PANEL - Abnormal; Notable for the following components:      Result Value   Anion gap 4 (*)    All other components within normal limits  CBC  POC URINE PREG, ED  TROPONIN I (HIGH SENSITIVITY)  TROPONIN I (HIGH SENSITIVITY)    EKG None   EKG reviewed by Dr. Estell HarpinZammit.     Radiology Dg Chest 2 View  Result Date: 07/24/2019 CLINICAL DATA:  Chest pain. EXAM: CHEST - 2 VIEW COMPARISON:  11/26/14 FINDINGS: The heart size and mediastinal contours are within normal limits. Both lungs are clear. The visualized skeletal structures are unremarkable. IMPRESSION: No active cardiopulmonary disease. Electronically Signed   By: Signa Kellaylor  Stroud M.D.   On: 07/24/2019 16:06     Procedures Procedures (including critical care time)  Medications Ordered in ED Medications  sodium chloride flush (NS) 0.9 % injection 3 mL (3 mLs Intravenous Not Given 07/24/19 1603)     Initial Impression / Assessment and Plan / ED Course  I have reviewed the triage vital signs and the nursing notes.  Pertinent labs & imaging results that were available during my care of the patient were reviewed by me and considered in my medical decision making (see chart for details).   HEART score is 2    Pt with lower mid and right sided chest pain.  Pain has been intermittent for one week.  Work up today has been reassuring.  No concerning sx's for PE.  Pt also seen by Dr. Estell HarpinZammit and care plan discussed.  Sx's felt to be related to her GERD.  I feel that she is appropriate for d/c home.  Will have her increase her Dexilant to twice daily for 1-2 weeks and she agrees to arrange close PCP f/u and to f/u with cardiology.  Strict return precautions also discussed.    Final Clinical Impressions(s) / ED Diagnoses   Final diagnoses:  Precordial pain    ED Discharge Orders    None       Rosey Bathriplett, Shalah Estelle, PA-C 07/24/19 2054    Bethann BerkshireZammit, Joseph, MD 07/26/19 1311

## 2019-07-25 ENCOUNTER — Ambulatory Visit (INDEPENDENT_AMBULATORY_CARE_PROVIDER_SITE_OTHER): Payer: Commercial Managed Care - PPO | Admitting: NURSE PRACTITIONER

## 2019-07-25 ENCOUNTER — Other Ambulatory Visit: Payer: Self-pay

## 2019-07-25 ENCOUNTER — Encounter (INDEPENDENT_AMBULATORY_CARE_PROVIDER_SITE_OTHER): Payer: Self-pay

## 2019-07-25 VITALS — BP 102/64 | HR 84 | Temp 97.1°F | Resp 16 | Ht 66.0 in | Wt 225.3 lb

## 2019-07-25 DIAGNOSIS — J320 Chronic maxillary sinusitis: Secondary | ICD-10-CM

## 2019-07-25 MED ORDER — DOXYCYCLINE MONOHYDRATE 100 MG CAPSULE
100.00 mg | ORAL_CAPSULE | Freq: Two times a day (BID) | ORAL | 0 refills | Status: AC
Start: 2019-07-25 — End: 2019-08-04

## 2019-07-25 NOTE — Patient Instructions (Signed)
Take prenatal vitamin 2 hours before or 4 hours after the doxycycline

## 2019-07-25 NOTE — Progress Notes (Signed)
[address], [place]  [address]  [place] [address]    Progress Note    Name: Monique [name] MRN:  [id number]   Date: 07/25/2019 Age: 53 y.o.         Reason for Visit: Headache (headache around eyes,frontal and top of head, maxillary pressure, itchiness in Lt ear  onset 3 days ago)    History of Present Illness  Monique [name] is a 53 y.o. female who is being seen today for 3 day history of sinus congestion , ear pain, denies alleviating or aggravating factors. Denies cough, sob, chills, fever, loss of sense of taste or smell.    Nursing Notes:  There are no exam notes on file for this visit.    Past Medical History:   Diagnosis Date   . Anxiety    . Carotid stenosis    . Chronic back pain    . Coronary artery disease    . DDD (degenerative disc disease), lumbar     "entire spine"   . Ear piercing    . GERD (gastroesophageal reflux disease)    . H/O complete eye exam     2 years, Dr. [name], at [facility]   . History of dental examination     dentures upper and lower   . HTN    . Hyperlipidemia    . MI (myocardial infarction) (CMS [place]) 2012   . Neck problem     herniated cervical disc   . Obesity (BMI 30-39.9)    . 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 [place] ordered.    . Stroke (CMS [facility])     2002   . Tattoo     Left breast, Right shoulder   . Type 2 diabetes mellitus (CMS [facility])          Past Surgical History:   Procedure Laterality Date   . ENDOMETRIAL ABLATION  1998   . HX ANKLE FRACTURE TX  1990s    Right, Doran Durand procedure   . HX CATARACT REMOVAL Bilateral 2013    r and l eyes with implants   . HX CESAREAN SECTION  06/20/2000    x3, 11/21/86, 11/10/84   . HX CHOLECYSTECTOMY  1988   . HX CORONARY ARTERY BYPASS GRAFT  03/31/2011    cardiac, 2 vessel, Dr. [name], [facility]   . HX CORONARY STENT PLACEMENT  2012    x4 stents   . HX GASTRIC SLEEVE  02/2017    [place], [place], Dr. Pearlie Oyster   . HX HEART CATHETERIZATION  2012    massive heart  attack and stents - [facility]   . HX THYROID BIOPSY     . HX THYROIDECTOMY  101/01/2018   . HX TONSILLECTOMY      as child   . HX YAG Left 09/20/2013         Current Outpatient Medications   Medication Sig   . ACCU-CHEK AVIVA PLUS TEST STRP Strip 1 Strip by Subcutaneous route Three times a day as needed   . Blood-Glucose Meter (ACCU-CHEK AVIVA PLUS METER) Misc 1 Kit by Does not apply route Three times a day   . clopidogreL (PLAVIX) 75 mg Oral Tablet Take 1 Tab (75 mg total) by mouth Once a day   . cyanocobalamin (VITAMIN B12) 1,000 mcg/mL Injection Solution 1 mL (1,000 mcg total) by Subcutaneous route Every 30 days   . cyanocobalamin (VITAMIN B12) 1,000 mcg/mL  Injection Solution INJECT 1 MILLILITER( 1000 MCG) INTRAMUSCULARLY EVERY MONTH   . diclofenac sodium (VOLTAREN) 1 % Gel 2 g by Apply Topically route Three times a day as needed   . docusate sodium (COLACE) 100 mg Oral Capsule Take 1 Cap (100 mg total) by mouth Twice daily   . doxycycline monohydrate (MONODOX) 100 mg Oral Capsule Take 1 Cap (100 mg total) by mouth Twice daily for 10 days   . empagliflozin-linagliptin (GLYXAMBI) 10-5 mg Oral Tablet TAKE 1 TABLET BY MOUTH ONCE DAILY   . ergocalciferol, vitamin D2, (DRISDOL) 1,250 mcg (50,000 unit) Oral Capsule TAKE 1 CAPSULE BY MOUTH EVERY WEEK   . exenatide microspheres ER (BYDUREON) 2 mg Subcutaneous 2 mg by Subcutaneous route Every 7 days   . famotidine (PEPCID) 20 mg Oral Tablet Take 1 Tab (20 mg total) by mouth Every evening   . flash glucose scanning reader (FREESTYLE LIBRE 14 DAY READER) Does not apply Misc 1 Box by Does not apply route Three times a day   . flash glucose sensor (FREESTYLE LIBRE 14 DAY SENSOR) Does not apply Kit 1 Kit by Does not apply route Three times a day   . furosemide (LASIX) 40 mg Oral Tablet Take 1 Tab (40 mg total) by mouth Once a day   . HYDROcodone-acetaminophen (NORCO) 10-325 mg Oral Tablet Take 1 Tab by mouth Four times a day    . Ibuprofen (MOTRIN) 800 mg Oral Tablet Take 1 Tab (800  mg total) by mouth Three times a day as needed for Pain (Patient not taking: Reported on 07/25/2019)   . levothyroxine (SYNTHROID) 25 mcg Oral Tablet Take 25 mcg by mouth Once a day   . lisinopriL (PRINIVIL) 2.5 mg Oral Tablet 1 q day   . loratadine (CLARITIN) 10 mg Oral Tablet Take 1 Tab (10 mg total) by mouth Once a day (Patient not taking: Reported on 07/25/2019)   . metFORMIN (GLUCOPHAGE) 500 mg Oral Tablet take 1 tablet by mouth twice a day with food   . metoprolol succinate (TOPROL-XL) 25 mg Oral Tablet Sustained Release 24 hr TAKE 1/2 TABLET ONCE DAILY   . NARCAN 4 mg/actuation Nasal Spray, Non-Aerosol PT STATED SHE HAS NARCAN AT HOME BUT IS NOT TAKING IT   . niacin (NIASPAN) 500 mg Oral Tablet Sustained Release Take 1 Tab (500 mg total) by mouth Once a day   . nitroGLYCERIN (NITROSTAT) 0.4 mg Sublingual Tablet, Sublingual 1 Tab (0.4 mg total) by Sublingual route Every 5 minutes as needed for Chest pain for 3 doses over 15 minutes   . omeprazole (PRILOSEC) 40 mg Oral Capsule, Delayed Release(E.C.) Take 1 Cap (40 mg total) by mouth Once a day   . ondansetron (ZOFRAN ODT) 8 mg Oral Tablet, Rapid Dissolve Take 1 Tab (8 mg total) by mouth Every 8 hours as needed for Nausea/Vomiting   . PRENATAL PLUS, CALCIUM CARB, 27 mg iron- 1 mg Oral Tablet Take 1 Tab by mouth Once a day   . PROVENTIL HFA 90 mcg/actuation Inhalation HFA Aerosol Inhaler Take 1-2 Puffs by inhalation Every 4 hours as needed   . rosuvastatin (CRESTOR) 20 mg Oral Tablet Take 1 Tab (20 mg total) by mouth Once a day   . venlafaxine (EFFEXOR XR) 37.5 mg Oral Capsule, Sust. Release 24 hr Take 1 Cap (37.5 mg total) by mouth Once a day   . VENTOLIN HFA 90 mcg/actuation Inhalation HFA Aerosol Inhaler Take 1-2 Puffs by inhalation Every 6 hours as needed   .  ZYRTEC-D 5-120 mg Oral Tablet Sustained Release 12 hr Take 1 Tab by mouth Once a day     Allergies   Allergen Reactions   . Ane Payment [Pyrilamine-Dextromethorphan]  Other Adverse Reaction (Add comment)      LIGHT HEADED   . Tylenol W Codeine [Acetaminophen-Codeine]  Other Adverse Reaction (Add comment)     pt states that one time she had chest pains with this med, but dr. thought it was because she had not eaten     Family Medical History:     Problem Relation (Age of Onset)    Atrial fibrillation Sister    Colon Cancer Father    Diabetes Mother, Maternal Aunt    Heart Disease Sister, Maternal Grandfather    Hypertension (High Blood Pressure) Mother    Lung Cancer Maternal Grandfather    MI <45 years of age Mother            Social History     Tobacco Use   . Smoking status: Current Every Day Smoker     Packs/day: 0.50     Years: 20.00     Pack years: 10.00     Types: Cigarettes     Start date: 62   . Smokeless tobacco: Never Used   Substance Use Topics   . Alcohol use: No     Frequency: Never     Binge frequency: Never   . Drug use: No       Review of Systems  Review of Systems   Constitutional: Negative.  Negative for chills and fever.   HENT: Positive for congestion, ear pain and sinus pain.    Eyes: Negative.    Respiratory: Negative.  Negative for cough, sputum production and shortness of breath.    Cardiovascular: Negative.    Gastrointestinal: Negative.    Genitourinary: Negative.    Musculoskeletal: Negative.    Skin: Negative.    Neurological: Negative.    Endo/Heme/Allergies: Negative.    Psychiatric/Behavioral: Negative.        Physical Exam:  BP 102/64   Pulse 84   Temp 36.2 C (97.1 F)   Resp 16   Ht 1.676 m (5' 6" )   Wt 102 kg (225 lb 4.8 oz)   LMP  (LMP Unknown)   SpO2 95%   BMI 36.36 kg/m       Physical Exam   Constitutional: She is oriented to person, place, and time and well-developed, well-nourished, and in no distress.   HENT:   Head: Normocephalic and atraumatic.   Right Ear: External ear normal. Tympanic membrane is not erythematous and not bulging.   Left Ear: External ear normal. Tympanic membrane is bulging. Tympanic membrane is not erythematous.   Nose: Mucosal edema present. Right  sinus exhibits maxillary sinus tenderness. Left sinus exhibits maxillary sinus tenderness.   Mouth/Throat: Oropharynx is clear and moist. No oropharyngeal exudate.   Neck: Neck supple.   Cardiovascular: Normal rate, regular rhythm and normal heart sounds.   Pulmonary/Chest: Effort normal and breath sounds normal. No respiratory distress.   Lymphadenopathy:     She has no cervical adenopathy.   Neurological: She is alert and oriented to person, place, and time.   Skin: Skin is warm and dry.   Psychiatric: Affect and judgment normal.   Nursing note and vitals reviewed.      Assessment:    Maxillary sinusitis, unspecified chronicity        Plan:      Orders Placed This  Encounter   . doxycycline monohydrate (MONODOX) 100 mg Oral Capsule       Follow up: No follow-ups on file.    Follow up with PCP.  Seek medical attention for new or worsening symptoms.    This note was partially created using MModal Fluency Direct system (voice recognition software) and is inherently subject to errors including those of syntax and "sound-alike" substitutions which may escape proofreading.  In such instances, original meaning may be extrapolated by contextual derivation.     I saw the patient independently.            Kavin Leech, FNP  07/25/2019, 16:26

## 2019-07-26 ENCOUNTER — Other Ambulatory Visit (HOSPITAL_BASED_OUTPATIENT_CLINIC_OR_DEPARTMENT_OTHER): Payer: Self-pay | Admitting: Family Medicine

## 2019-07-26 NOTE — Telephone Encounter (Signed)
Regarding: Dr. Russ Halo  ----- Message from Myna Hidalgo sent at 07/25/2019  2:58 PM EDT -----  Patient is wanting to know she can have something called in for her sinuses.  Patient states that she does not have any other symptons other than a headache, no fever or runny nose.

## 2019-07-26 NOTE — Progress Notes (Unsigned)
Please have patient continue to take Claritin 10 mg daily.   She may also try adding a nasal spray such as Nasacort or Flonase.  Short-term use of decongestants, such as Sudafed OTC, may also be beneficial.  Let us know if conservative measures are ineffective.    Chryl Heck, PA-C  07/26/2019, 09:36

## 2019-07-27 ENCOUNTER — Encounter (INDEPENDENT_AMBULATORY_CARE_PROVIDER_SITE_OTHER): Payer: Self-pay | Admitting: Student in an Organized Health Care Education/Training Program

## 2019-07-27 ENCOUNTER — Other Ambulatory Visit: Payer: Self-pay

## 2019-07-27 ENCOUNTER — Ambulatory Visit
Payer: Commercial Managed Care - PPO | Attending: Student in an Organized Health Care Education/Training Program | Admitting: Student in an Organized Health Care Education/Training Program

## 2019-07-27 VITALS — HR 76 | Temp 98.1°F | Resp 20 | Ht 66.69 in | Wt 223.8 lb

## 2019-07-27 DIAGNOSIS — M25512 Pain in left shoulder: Secondary | ICD-10-CM | POA: Insufficient documentation

## 2019-07-27 DIAGNOSIS — K219 Gastro-esophageal reflux disease without esophagitis: Secondary | ICD-10-CM | POA: Insufficient documentation

## 2019-07-27 DIAGNOSIS — I10 Essential (primary) hypertension: Secondary | ICD-10-CM | POA: Insufficient documentation

## 2019-07-27 DIAGNOSIS — M549 Dorsalgia, unspecified: Secondary | ICD-10-CM

## 2019-07-27 DIAGNOSIS — M25511 Pain in right shoulder: Secondary | ICD-10-CM | POA: Insufficient documentation

## 2019-07-27 DIAGNOSIS — Z6835 Body mass index (BMI) 35.0-35.9, adult: Secondary | ICD-10-CM | POA: Insufficient documentation

## 2019-07-27 DIAGNOSIS — M5136 Other intervertebral disc degeneration, lumbar region: Secondary | ICD-10-CM | POA: Insufficient documentation

## 2019-07-27 DIAGNOSIS — M545 Low back pain: Secondary | ICD-10-CM

## 2019-07-27 DIAGNOSIS — Z951 Presence of aortocoronary bypass graft: Secondary | ICD-10-CM | POA: Insufficient documentation

## 2019-07-27 DIAGNOSIS — G8929 Other chronic pain: Secondary | ICD-10-CM | POA: Insufficient documentation

## 2019-07-27 DIAGNOSIS — Z9884 Bariatric surgery status: Secondary | ICD-10-CM | POA: Insufficient documentation

## 2019-07-27 DIAGNOSIS — Z8673 Personal history of transient ischemic attack (TIA), and cerebral infarction without residual deficits: Secondary | ICD-10-CM | POA: Insufficient documentation

## 2019-07-27 DIAGNOSIS — F17210 Nicotine dependence, cigarettes, uncomplicated: Secondary | ICD-10-CM | POA: Insufficient documentation

## 2019-07-27 DIAGNOSIS — M25552 Pain in left hip: Secondary | ICD-10-CM | POA: Insufficient documentation

## 2019-07-27 DIAGNOSIS — F419 Anxiety disorder, unspecified: Secondary | ICD-10-CM | POA: Insufficient documentation

## 2019-07-27 DIAGNOSIS — I252 Old myocardial infarction: Secondary | ICD-10-CM | POA: Insufficient documentation

## 2019-07-27 DIAGNOSIS — E669 Obesity, unspecified: Secondary | ICD-10-CM | POA: Insufficient documentation

## 2019-07-27 NOTE — Progress Notes (Signed)
[facility]   [address], [address], [place] [address]  [phone]    Monique [name]  07/27/2019  [id number]    HPI     Pt is a 54yo female with PMH DDD, chronic back pain, prior stroke, CABG x 2, HTN, MI, GERD, anxiety, morbid obesity s/p gastric sleeve who presents with pain in the shoulders in the middle of the back left and middle lower back and left hip.  The pain goes down the legs on the lateral thigh.  Does not radiate into the toes.  She pain is constant sharp 9/10. Pain is better with Voltaren gel, worse with walking.    She is on Plavix.She was one hydrocodone 10/325 180 pills per month. She was seen my Dr. Woodroe Chen who was doing these medicines.  She states his clinic cut her back to 90 pills per month.  She also take Effexor 37.5 once daily.  She denies prior back/hiop surgery.  She has been through PT about 6 months ago.    Cannot take NSAIDs d/t gastric bypass.    Previous procedures:  No injections in the past (could not qualify d/t blood sugar 180s)    Previous medications tried:  Per HPI    [place] checked      [place] Pain Rating Scale     On a scale of 0-10, during the past 24 hours, pain has interfered with you usual activity: 10     On a scale of 0-10, during the past 24 hours, pain has interfered with your sleep: 7    On a scale of 0-10, during the past 24 hours, pain has affected your mood: 9     On a scale of 0-10, during the past 24 hours, pain has contributed to your stress: 10     On a scale of 0-10, what is your overall pain Rating: 9         No flowsheet data found.    Past Medical History:   Diagnosis Date   . Anxiety    . Carotid stenosis    . Chronic back pain    . Coronary artery disease    . DDD (degenerative disc disease), lumbar     "entire spine"   . Ear piercing    . GERD (gastroesophageal reflux disease)    . H/O complete eye exam     2 years, Dr. [name], at [facility]   . History of dental examination      dentures upper and lower   . HTN    . Hyperlipidemia    . MI (myocardial infarction) (CMS [facility]) 2012   . Neck problem     herniated cervical disc   . Obesity (BMI 30-39.9)    . 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 [place] ordered.    . Stroke (CMS [facility])     2002   . Tattoo     Left breast, Right shoulder   . Type 2 diabetes mellitus (CMS [facility])          Past Surgical History:   Procedure Laterality Date   . ENDOMETRIAL ABLATION  1998   . HX ANKLE FRACTURE TX  1990s    Right, Doran Durand procedure   . HX CATARACT REMOVAL Bilateral 2013    r and l eyes with implants   . HX CESAREAN SECTION  06/20/2000  x3, 11/21/86, 11/10/84   . HX CHOLECYSTECTOMY  1988   . HX CORONARY ARTERY BYPASS GRAFT  03/31/2011    cardiac, 2 vessel, Dr. [name], [facility]   . HX CORONARY STENT PLACEMENT  2012    x4 stents   . HX GASTRIC SLEEVE  02/2017    [place], [place], Dr. Pearlie Oyster   . HX HEART CATHETERIZATION  2012    massive heart attack and stents - [facility]   . HX THYROID BIOPSY     . HX THYROIDECTOMY  101/01/2018   . HX TONSILLECTOMY      as child   . HX YAG Left 09/20/2013         Family Medical History:     Problem Relation (Age of Onset)    Atrial fibrillation Sister    Colon Cancer Father    Diabetes Mother, Maternal Aunt    Heart Disease Sister, Maternal Grandfather    Hypertension (High Blood Pressure) Mother    Lung Cancer Maternal Grandfather    MI <13 years of age Mother            Social History     Socioeconomic History   . Marital status: Married     Spouse name: Monique [name]   . Number of children: 3   . Years of education: completed 11th grade   . Highest education level: Not on file   Occupational History   . Occupation: disabled   Social Needs   . Financial resource strain: Not hard at all   . Food insecurity     Worry: Never true     Inability: Never true   . Transportation needs     Medical: No     Non-medical: No   Tobacco Use   . Smoking status: Current Every Day Smoker      Packs/day: 0.50     Years: 20.00     Pack years: 10.00     Types: Cigarettes     Start date: 36   . Smokeless tobacco: Never Used   Substance and Sexual Activity   . Alcohol use: No     Frequency: Never     Binge frequency: Never   . Drug use: No   . Sexual activity: Yes     Partners: Male   Lifestyle   . Physical activity     Days per week: 0 days     Minutes per session: 0 min   . Stress: To some extent   Relationships   . Social connections     Talks on phone: More than three times a week     Gets together: More than three times a week     Attends religious service: Never     Active member of club or organization: No     Attends meetings of clubs or organizations: Never     Relationship status: Married   . Intimate partner violence     Fear of current or ex partner: No     Emotionally abused: No     Physically abused: No     Forced sexual activity: No   Other Topics Concern   . Routine Exercise No   . Ability to Walk 2 Flight of Steps without SOB/CP Yes   Social History Narrative    Living situation: lives at home with husband, Monique [name], and daughter.  1 dog named Duke and 1 cat named Lucky.    Nutrition:  Eats 5 small meals d/t gastric sleeve  surgery.     Caffeine use: none, decaf coffee    Exercise: stays busy around house. No formal form of exercise    Seatbelt use: sometimes    Fire extinguishers in the home: yes    Smoke alarms in the home: yes    Carbon monoxide detectors in the home: yes    Nancy Fetter exposure: sunglasses        Monique [name] would like for husband, Monique [name],  to speak for her in the event she would become incapacitated.    Full code.      Current Outpatient Medications   Medication Sig   . ACCU-CHEK AVIVA PLUS TEST STRP Strip 1 Strip by Subcutaneous route Three times a day as needed   . Blood-Glucose Meter (ACCU-CHEK AVIVA PLUS METER) Misc 1 Kit by Does not apply route Three times a day   . clopidogreL (PLAVIX) 75 mg Oral Tablet Take 1 Tab (75 mg total) by mouth Once a day   . cyanocobalamin  (VITAMIN B12) 1,000 mcg/mL Injection Solution 1 mL (1,000 mcg total) by Subcutaneous route Every 30 days   . cyanocobalamin (VITAMIN B12) 1,000 mcg/mL Injection Solution INJECT 1 MILLILITER( 1000 MCG) INTRAMUSCULARLY EVERY MONTH   . diclofenac sodium (VOLTAREN) 1 % Gel 2 g by Apply Topically route Three times a day as needed   . docusate sodium (COLACE) 100 mg Oral Capsule Take 1 Cap (100 mg total) by mouth Twice daily   . doxycycline monohydrate (MONODOX) 100 mg Oral Capsule Take 1 Cap (100 mg total) by mouth Twice daily for 10 days   . empagliflozin-linagliptin (GLYXAMBI) 10-5 mg Oral Tablet TAKE 1 TABLET BY MOUTH ONCE DAILY   . ergocalciferol, vitamin D2, (DRISDOL) 1,250 mcg (50,000 unit) Oral Capsule TAKE 1 CAPSULE BY MOUTH EVERY WEEK   . exenatide microspheres ER (BYDUREON) 2 mg Subcutaneous 2 mg by Subcutaneous route Every 7 days   . famotidine (PEPCID) 20 mg Oral Tablet Take 1 Tab (20 mg total) by mouth Every evening   . flash glucose scanning reader (FREESTYLE LIBRE 14 DAY READER) Does not apply Misc 1 Box by Does not apply route Three times a day   . flash glucose sensor (FREESTYLE LIBRE 14 DAY SENSOR) Does not apply Kit 1 Kit by Does not apply route Three times a day   . furosemide (LASIX) 40 mg Oral Tablet Take 1 Tab (40 mg total) by mouth Once a day   . HYDROcodone-acetaminophen (NORCO) 10-325 mg Oral Tablet Take 1 Tab by mouth Four times a day    . Ibuprofen (MOTRIN) 800 mg Oral Tablet Take 1 Tab (800 mg total) by mouth Three times a day as needed for Pain (Patient not taking: Reported on 07/25/2019)   . levothyroxine (SYNTHROID) 25 mcg Oral Tablet Take 25 mcg by mouth Once a day   . lisinopriL (PRINIVIL) 2.5 mg Oral Tablet 1 q day   . loratadine (CLARITIN) 10 mg Oral Tablet Take 1 Tab (10 mg total) by mouth Once a day (Patient not taking: Reported on 07/25/2019)   . metFORMIN (GLUCOPHAGE) 500 mg Oral Tablet take 1 tablet by mouth twice a day with food   . metoprolol succinate (TOPROL-XL) 25 mg Oral  Tablet Sustained Release 24 hr TAKE 1/2 TABLET ONCE DAILY   . NARCAN 4 mg/actuation Nasal Spray, Non-Aerosol PT STATED SHE HAS NARCAN AT HOME BUT IS NOT TAKING IT   . niacin (NIASPAN) 500 mg Oral Tablet Sustained Release Take 1 Tab (500 mg total) by  mouth Once a day   . nitroGLYCERIN (NITROSTAT) 0.4 mg Sublingual Tablet, Sublingual 1 Tab (0.4 mg total) by Sublingual route Every 5 minutes as needed for Chest pain for 3 doses over 15 minutes   . omeprazole (PRILOSEC) 40 mg Oral Capsule, Delayed Release(E.C.) Take 1 Cap (40 mg total) by mouth Once a day   . ondansetron (ZOFRAN ODT) 8 mg Oral Tablet, Rapid Dissolve Take 1 Tab (8 mg total) by mouth Every 8 hours as needed for Nausea/Vomiting   . PRENATAL PLUS, CALCIUM CARB, 27 mg iron- 1 mg Oral Tablet Take 1 Tab by mouth Once a day   . PROVENTIL HFA 90 mcg/actuation Inhalation HFA Aerosol Inhaler Take 1-2 Puffs by inhalation Every 4 hours as needed   . rosuvastatin (CRESTOR) 20 mg Oral Tablet Take 1 Tab (20 mg total) by mouth Once a day   . venlafaxine (EFFEXOR XR) 37.5 mg Oral Capsule, Sust. Release 24 hr Take 1 Cap (37.5 mg total) by mouth Once a day   . VENTOLIN HFA 90 mcg/actuation Inhalation HFA Aerosol Inhaler Take 1-2 Puffs by inhalation Every 6 hours as needed   . ZYRTEC-D 5-120 mg Oral Tablet Sustained Release 12 hr Take 1 Tab by mouth Once a day     Allergies   Allergen Reactions   . Ane Payment [Pyrilamine-Dextromethorphan]  Other Adverse Reaction (Add comment)     LIGHT HEADED   . Tylenol W Codeine [Acetaminophen-Codeine]  Other Adverse Reaction (Add comment)     pt states that one time she had chest pains with this med, but dr. thought it was because she had not eaten     Pulse 76   Temp 36.7 C (98.1 F)   Resp 20   Ht 1.694 m (5' 6.69")   Wt 101 kg (223 lb 12.3 oz)   LMP  (LMP Unknown)   SpO2 99%   BMI 35.37 kg/m         LABS:  No results found for this or any previous visit (from the past 24 hour(s)).    Review of Systems   Constitutional: Negative  for chills and fever.   Respiratory: Negative for shortness of breath and wheezing.    Cardiovascular: Negative for chest pain and palpitations.   Gastrointestinal: Negative for abdominal pain, nausea and vomiting.   Genitourinary: Negative for dysuria and hematuria.   Musculoskeletal: Positive for back pain, joint pain, myalgias and neck pain. Negative for falls.   Skin: Negative for itching and rash.   Neurological: Negative for seizures and loss of consciousness.   Endo/Heme/Allergies: Does not bruise/bleed easily.        STUDIES:  As per HPI    Physical Exam   Constitutional: She is oriented to person, place, and time and well-developed, well-nourished, and in no distress.   HENT:   Head: Normocephalic and atraumatic.   Eyes: Conjunctivae are normal. No scleral icterus.   Pulmonary/Chest: Effort normal. No respiratory distress.   Abdominal: She exhibits no distension.   Musculoskeletal:         General: No deformity.   Neurological: She is alert and oriented to person, place, and time. Gait normal.   Skin: No rash noted.   Psychiatric: Affect and judgment normal.     MSK:  Lumbar Cervical   ROM: Limited flexion and Limited extension ROM: Preserved   Palpation: Tender Palpation:  Tender   Motor: 5/5 in LEs b/l    Sensory: Intact in LEs b/l    Facet  loading: Positive bilaterally    Facet TTP: Positive bilaterally    Trigger points:  Lumbar paraspinal bilateral    Straight leg raise:  Negative bilaterally        SI Joint    Palpation: Tender bilaterally                          A/P:  Pt is a 53yo female with PMH DDD, chronic back pain, prior stroke, CABG x 2, HTN, MI, GERD, anxiety, morbid obesity s/p gastric sleeve who presents with pain in the shoulders in the middle of the back left and middle lower back and left hip.  - Home exercise plan  - Opioids pain eval.  Low likelihood of using this modality, but eval just in case. Explained to patient.  - urine drug screen today  - Obtain MRI report    Patient was  referred for pain treatment. They have tried and failed conservative care in the form of modified activities, physical therapy and anti-inflammatory medications or pain medications for at least six weeks in the last 6 months.      I have reviewed and updated as appropriate the past medical, family and social history.    Any imaging studies provided by the patient for this office visit were reviewed and discussed with the patient. When practical, the images were reviewed in the presence of the patient.    Vernell Morgans, MD  07/27/2019, 14:15

## 2019-07-28 ENCOUNTER — Encounter (INDEPENDENT_AMBULATORY_CARE_PROVIDER_SITE_OTHER): Payer: Self-pay | Admitting: Student in an Organized Health Care Education/Training Program

## 2019-07-28 NOTE — Progress Notes (Signed)
CM called patient and left voicemail. CM called in regards to referral for opioid evaluation.  07/28/2019  Vernie Ammons, CASE MANAGER  07/28/2019, 14:17

## 2019-08-01 ENCOUNTER — Encounter (INDEPENDENT_AMBULATORY_CARE_PROVIDER_SITE_OTHER): Payer: Self-pay | Admitting: Student in an Organized Health Care Education/Training Program

## 2019-08-01 NOTE — Progress Notes (Signed)
CM called patient and left voicemail. CM called in regards to referral for opioid evaluation. This is the second message left for patient. Letter to be sent today  08/01/2019  Vernie Ammons, CASE MANAGER  08/01/2019, 13:20

## 2019-08-02 ENCOUNTER — Encounter (INDEPENDENT_AMBULATORY_CARE_PROVIDER_SITE_OTHER): Payer: Self-pay | Admitting: Student in an Organized Health Care Education/Training Program

## 2019-08-02 DIAGNOSIS — R52 Pain, unspecified: Secondary | ICD-10-CM | POA: Insufficient documentation

## 2019-08-13 ENCOUNTER — Other Ambulatory Visit: Payer: Self-pay

## 2019-08-16 ENCOUNTER — Ambulatory Visit (HOSPITAL_BASED_OUTPATIENT_CLINIC_OR_DEPARTMENT_OTHER): Payer: Self-pay | Admitting: Family Medicine

## 2019-08-16 DIAGNOSIS — M5136 Other intervertebral disc degeneration, lumbar region: Secondary | ICD-10-CM

## 2019-08-16 NOTE — Telephone Encounter (Signed)
Regarding: Dr Russ Halo  ----- Message from Laurance Flatten sent at 08/16/2019 10:39 AM EDT -----  Needs orthopedic referral sent to Dr Rosana Hoes. Claims it was sent to a different specialist who is no longer seeing [place] patients because of covid

## 2019-08-22 NOTE — Telephone Encounter (Signed)
Referral to Ortho pain Dr. Rosana Hoes was made on 08/16/2019.     Hal Neer, LPN

## 2019-08-22 NOTE — Telephone Encounter (Signed)
Regarding: Dr Russ Halo  ----- Message from Laurance Flatten sent at 08/22/2019  2:51 PM EDT -----  Was told on sep 1st a referral was going to be sent for pain management and has not received a call about an appt. Thinks it was supposed to be sent to a [facility]

## 2019-08-31 ENCOUNTER — Ambulatory Visit
Admission: RE | Admit: 2019-08-31 | Discharge: 2019-08-31 | Disposition: A | Discharge status: Home / Self Care | Payer: Commercial Managed Care - PPO | Source: Ambulatory Visit | Attending: PHYSICIAN ASSISTANT | Admitting: PHYSICIAN ASSISTANT

## 2019-08-31 ENCOUNTER — Encounter (HOSPITAL_BASED_OUTPATIENT_CLINIC_OR_DEPARTMENT_OTHER): Payer: Self-pay | Admitting: PHYSICIAN ASSISTANT

## 2019-08-31 ENCOUNTER — Ambulatory Visit (INDEPENDENT_AMBULATORY_CARE_PROVIDER_SITE_OTHER): Payer: Commercial Managed Care - PPO | Admitting: PHYSICIAN ASSISTANT

## 2019-08-31 ENCOUNTER — Other Ambulatory Visit: Payer: Self-pay

## 2019-08-31 ENCOUNTER — Ambulatory Visit (HOSPITAL_BASED_OUTPATIENT_CLINIC_OR_DEPARTMENT_OTHER)
Admission: RE | Admit: 2019-08-31 | Discharge: 2019-08-31 | Disposition: A | Discharge status: Home / Self Care | Payer: Commercial Managed Care - PPO | Source: Ambulatory Visit | Attending: Family Medicine | Admitting: Family Medicine

## 2019-08-31 ENCOUNTER — Other Ambulatory Visit (HOSPITAL_BASED_OUTPATIENT_CLINIC_OR_DEPARTMENT_OTHER): Payer: Self-pay | Admitting: Family Medicine

## 2019-08-31 ENCOUNTER — Encounter (HOSPITAL_BASED_OUTPATIENT_CLINIC_OR_DEPARTMENT_OTHER): Payer: Self-pay | Admitting: Family Medicine

## 2019-08-31 VITALS — BP 118/72 | HR 65 | Temp 96.0°F | Ht 66.0 in | Wt 222.0 lb

## 2019-08-31 DIAGNOSIS — R110 Nausea: Secondary | ICD-10-CM

## 2019-08-31 DIAGNOSIS — M25562 Pain in left knee: Secondary | ICD-10-CM

## 2019-08-31 DIAGNOSIS — M5136 Other intervertebral disc degeneration, lumbar region: Secondary | ICD-10-CM

## 2019-08-31 DIAGNOSIS — M25552 Pain in left hip: Secondary | ICD-10-CM | POA: Insufficient documentation

## 2019-08-31 DIAGNOSIS — M8588 Other specified disorders of bone density and structure, other site: Secondary | ICD-10-CM | POA: Insufficient documentation

## 2019-08-31 DIAGNOSIS — M7062 Trochanteric bursitis, left hip: Secondary | ICD-10-CM

## 2019-08-31 DIAGNOSIS — M25569 Pain in unspecified knee: Secondary | ICD-10-CM | POA: Insufficient documentation

## 2019-08-31 DIAGNOSIS — M545 Low back pain, unspecified: Secondary | ICD-10-CM

## 2019-08-31 DIAGNOSIS — Z1239 Encounter for other screening for malignant neoplasm of breast: Secondary | ICD-10-CM

## 2019-08-31 DIAGNOSIS — F17210 Nicotine dependence, cigarettes, uncomplicated: Secondary | ICD-10-CM

## 2019-08-31 DIAGNOSIS — M5442 Lumbago with sciatica, left side: Secondary | ICD-10-CM

## 2019-08-31 DIAGNOSIS — G8929 Other chronic pain: Secondary | ICD-10-CM

## 2019-08-31 MED ORDER — METHOCARBAMOL 500 MG TABLET
500.00 mg | ORAL_TABLET | Freq: Three times a day (TID) | ORAL | 1 refills | Status: DC | PRN
Start: 2019-08-31 — End: 2020-08-28

## 2019-08-31 NOTE — Progress Notes (Signed)
Mild arthritis noted about the knee.   We will review these together at our next encounter.
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
## 2019-08-31 NOTE — Progress Notes (Signed)
Please inform patient that her XR of the hip showed mild arthritis.   Vascular calcification is noted.   We will review these together at our next encounter.

## 2019-08-31 NOTE — Addendum Note (Signed)
Addended by: Harland Dingwall on: 08/31/2019 09:59 PM     Modules accepted: Orders

## 2019-09-01 NOTE — Progress Notes (Signed)
Or patient that her x-ray of the low back did not show any acute fracture or destructive process.  She does have him moderate arthritis and mild degenerative disc disease noted.  She also has extensive vascular calcifications in the large vessels in the abdomen.  We will need to continue to control her diabetes, hypertension and other cardiovascular risk factors.  We will review these together at our next encounter.

## 2019-09-12 ENCOUNTER — Other Ambulatory Visit: Payer: Self-pay

## 2019-09-12 ENCOUNTER — Encounter (HOSPITAL_BASED_OUTPATIENT_CLINIC_OR_DEPARTMENT_OTHER): Payer: Self-pay | Admitting: Family Medicine

## 2019-09-12 ENCOUNTER — Ambulatory Visit (INDEPENDENT_AMBULATORY_CARE_PROVIDER_SITE_OTHER): Payer: Commercial Managed Care - PPO | Admitting: Family Medicine

## 2019-09-12 VITALS — BP 100/64 | HR 66 | Temp 96.1°F | Resp 12 | Ht 66.0 in | Wt 228.0 lb

## 2019-09-12 DIAGNOSIS — Z7984 Long term (current) use of oral hypoglycemic drugs: Secondary | ICD-10-CM

## 2019-09-12 DIAGNOSIS — E119 Type 2 diabetes mellitus without complications: Secondary | ICD-10-CM

## 2019-09-12 DIAGNOSIS — E785 Hyperlipidemia, unspecified: Secondary | ICD-10-CM

## 2019-09-12 DIAGNOSIS — I70221 Atherosclerosis of native arteries of extremities with rest pain, right leg: Secondary | ICD-10-CM

## 2019-09-12 LAB — POCT HGB A1C: POCT HGB A1C: 9.7 % — AB (ref 4–6)

## 2019-09-12 MED ORDER — NIACIN ER 500 MG TABLET,EXTENDED RELEASE
500.0000 mg | ORAL_TABLET | Freq: Every day | ORAL | 3 refills | Status: DC
Start: 2019-09-12 — End: 2021-10-22

## 2019-09-12 MED ORDER — ROSUVASTATIN 40 MG TABLET
40.0000 mg | ORAL_TABLET | Freq: Every day | ORAL | 2 refills | Status: DC
Start: 2019-09-12 — End: 2019-09-26

## 2019-09-12 NOTE — Progress Notes (Signed)
Patient Name: Monique [name]  Date: 09/12/2019  CCM [facility] [place], [place]  [place]  [place] [address]  [phone]  MRN: [id number]  DOB: 01/07/1966    Chief complaint: The patient is a 53 y.o. old female who came in today for Diabetes and imaging results today.      HPI:    Diabetes: Last HbA1C was 9.%  Patient denies symptoms of polydipsia, polyuria, hypoglycemic unawareness, erectile dysfunction, chest pain, dyspnea on exertion, diarrhea, foot ulcerations, paresthesias. Symptoms are not changed. Patient's home blood sugars are controlled. Pt is currently taking metformin, glyxambi and insulin for treatment. Patient is compliant with medications.    Imaging:  Patient with known osteoarthritis she had imaging completed since our last encounter.  Her complaint is of left back hip and knee pain she had plain films which showed osteoarthritis.  This is consistent with her complaint of achy pain worse with use and improved with rest and nonsteroidals.  However, She avoids nonsteroidals secondary to her bariatric surgery.  She rates her pain as moderate.  As an ache.  Radiation from the left SI joint to the ipsilateral knee.         ROS:  Constitutional: No fever, chills  Skin: No rashes or lesions  HENT: No sore throat, ear pain, or difficulty swallowing, positive for rhinorrhea attributes to allergic rhinitis.  Eyes: No vision changes, redness, discharge  Cardio: No chest pain, palpitations   Respiratory: No cough, wheezing or SOB  GI:  No nausea or vomiting. No diarrhea or constipation. No abdominal pain-reflux has returned/worsened.   GU:  No dysuria, hematuria, polyuria  MSK: No new joint pain.  No new neck or back pain  Neuro: No numbness, tingling, or weakness.  No headache  All other systems reviewed and are negative, unless commented on in the HPI.       Past medical history:  Past Medical History:   Diagnosis Date   . Anxiety    . Carotid stenosis    . Chronic  back pain    . Coronary artery disease    . DDD (degenerative disc disease), lumbar     "entire spine"   . Ear piercing    . GERD (gastroesophageal reflux disease)    . H/O complete eye exam     2 years, Dr. [name], at [facility]   . History of dental examination     dentures upper and lower   . HTN    . Hyperlipidemia    . MI (myocardial infarction) (CMS [place]) 2012   . Neck problem     herniated cervical disc   . Obesity (BMI 30-39.9)    . 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 [place] ordered.    . Stroke (CMS [facility])     2002   . Tattoo     Left breast, Right shoulder   . Type 2 diabetes mellitus (CMS [facility])      Past surgical history:  Past Surgical History:   Procedure Laterality Date   . ENDOMETRIAL ABLATION  1998   . HX ANKLE FRACTURE TX  1990s    Right, Doran Durand procedure   . HX CATARACT REMOVAL Bilateral 2013    r and l eyes with implants   . HX CESAREAN SECTION  06/20/2000    x3, 11/21/86, 11/10/84   . HX CHOLECYSTECTOMY  1988   . HX CORONARY  ARTERY BYPASS GRAFT  03/31/2011    cardiac, 2 vessel, Dr. [name], [facility]   . HX CORONARY STENT PLACEMENT  2012    x4 stents   . HX GASTRIC SLEEVE  02/2017    [place], [place], Dr. Pearlie Oyster   . HX HEART CATHETERIZATION  2012    massive heart attack and stents - [facility]   . HX THYROID BIOPSY     . HX THYROIDECTOMY  101/01/2018   . HX TONSILLECTOMY      as child   . HX YAG Left 09/20/2013     Medications:  Current Outpatient Medications   Medication Sig   . ACCU-CHEK AVIVA PLUS TEST STRP Strip 1 Strip by Subcutaneous route Three times a day as needed   . Blood-Glucose Meter (ACCU-CHEK AVIVA PLUS METER) Misc 1 Kit by Does not apply route Three times a day   . clopidogreL (PLAVIX) 75 mg Oral Tablet Take 1 Tab (75 mg total) by mouth Once a day   . cyanocobalamin (VITAMIN B12) 1,000 mcg/mL Injection Solution 1 mL (1,000 mcg total) by Subcutaneous route Every 30 days   . cyanocobalamin (VITAMIN B12) 1,000 mcg/mL Injection Solution  INJECT 1 MILLILITER( 1000 MCG) INTRAMUSCULARLY EVERY MONTH   . diclofenac sodium (VOLTAREN) 1 % Gel 2 g by Apply Topically route Three times a day as needed   . docusate sodium (COLACE) 100 mg Oral Capsule Take 1 Cap (100 mg total) by mouth Twice daily   . empagliflozin-linagliptin (GLYXAMBI) 10-5 mg Oral Tablet TAKE 1 TABLET BY MOUTH ONCE DAILY   . ergocalciferol, vitamin D2, (DRISDOL) 1,250 mcg (50,000 unit) Oral Capsule TAKE 1 CAPSULE BY MOUTH EVERY WEEK   . exenatide microspheres ER (BYDUREON) 2 mg Subcutaneous 2 mg by Subcutaneous route Every 7 days   . flash glucose scanning reader (FREESTYLE LIBRE 14 DAY READER) Does not apply Misc 1 Box by Does not apply route Three times a day   . flash glucose sensor (FREESTYLE LIBRE 14 DAY SENSOR) Does not apply Kit 1 Kit by Does not apply route Three times a day   . furosemide (LASIX) 40 mg Oral Tablet Take 1 Tab (40 mg total) by mouth Once a day   . HYDROcodone-acetaminophen (NORCO) 10-325 mg Oral Tablet Take 1 Tab by mouth Four times a day    . Ibuprofen (MOTRIN) 800 mg Oral Tablet Take 1 Tab (800 mg total) by mouth Three times a day as needed for Pain   . levothyroxine (SYNTHROID) 25 mcg Oral Tablet Take 25 mcg by mouth Once a day   . lisinopriL (PRINIVIL) 2.5 mg Oral Tablet 1 q day   . metFORMIN (GLUCOPHAGE) 500 mg Oral Tablet take 1 tablet by mouth twice a day with food   . methocarbamoL (ROBAXIN) 500 mg Oral Tablet Take 1 Tab (500 mg total) by mouth Three times a day as needed (muscle spasms)   . metoprolol succinate (TOPROL-XL) 25 mg Oral Tablet Sustained Release 24 hr TAKE 1/2 TABLET ONCE DAILY   . NARCAN 4 mg/actuation Nasal Spray, Non-Aerosol PT STATED SHE HAS NARCAN AT HOME BUT IS NOT TAKING IT   . niacin (NIASPAN) 500 mg Oral Tablet Sustained Release Take 1 Tab (500 mg total) by mouth Once a day   . nitroGLYCERIN (NITROSTAT) 0.4 mg Sublingual Tablet, Sublingual 1 Tab (0.4 mg total) by Sublingual route Every 5 minutes as needed for Chest pain for 3 doses over  15 minutes   . omeprazole (PRILOSEC) 40 mg Oral Capsule, Delayed  Release(E.C.) Take 1 Cap (40 mg total) by mouth Once a day   . ondansetron (ZOFRAN ODT) 8 mg Oral Tablet, Rapid Dissolve DISSOLVE 1 TABLET(8 MG) ON THE TONGUE EVERY 8 HOURS AS NEEDED FOR NAUSEA OR VOMITING   . PRENATAL PLUS, CALCIUM CARB, 27 mg iron- 1 mg Oral Tablet Take 1 Tab by mouth Once a day   . PROVENTIL HFA 90 mcg/actuation Inhalation HFA Aerosol Inhaler Take 1-2 Puffs by inhalation Every 4 hours as needed   . rosuvastatin (CRESTOR) 40 mg Oral Tablet Take 1 Tab (40 mg total) by mouth Once a day   . venlafaxine (EFFEXOR XR) 37.5 mg Oral Capsule, Sust. Release 24 hr Take 1 Cap (37.5 mg total) by mouth Once a day   . VENTOLIN HFA 90 mcg/actuation Inhalation HFA Aerosol Inhaler Take 1-2 Puffs by inhalation Every 6 hours as needed   . ZYRTEC-D 5-120 mg Oral Tablet Sustained Release 12 hr Take 1 Tab by mouth Once a day     Allergies:  Allergies   Allergen Reactions   . Ane Payment [Pyrilamine-Dextromethorphan]  Other Adverse Reaction (Add comment)     LIGHT HEADED   . Tylenol W Codeine [Acetaminophen-Codeine]  Other Adverse Reaction (Add comment)     pt states that one time she had chest pains with this med, but dr. thought it was because she had not eaten     Family history:  Family Medical History:     Problem Relation (Age of Onset)    Atrial fibrillation Sister    Colon Cancer Father    Diabetes Mother, Maternal Aunt    Heart Disease Sister, Maternal Grandfather    Hypertension (High Blood Pressure) Mother    Lung Cancer Maternal Grandfather    MI <13 years of age Mother        Social history:  Social History     Socioeconomic History   . Marital status: Married     Spouse name: [name]   . Number of children: 3   . Years of education: completed 11th grade   . Highest education level: Not on file   Occupational History   . Occupation: disabled   Social Needs   . Financial resource strain: Not hard at all   . Food insecurity     Worry: Never true      Inability: Never true   . Transportation needs     Medical: No     Non-medical: No   Tobacco Use   . Smoking status: Current Every Day Smoker     Packs/day: 0.50     Years: 20.00     Pack years: 10.00     Types: Cigarettes     Start date: 48   . Smokeless tobacco: Never Used   Substance and Sexual Activity   . Alcohol use: No     Frequency: Never     Binge frequency: Never   . Drug use: No   . Sexual activity: Yes     Partners: Male   Lifestyle   . Physical activity     Days per week: 0 days     Minutes per session: 0 min   . Stress: To some extent   Relationships   . Social connections     Talks on phone: More than three times a week     Gets together: More than three times a week     Attends religious service: Never     Active member of club  or organization: No     Attends meetings of clubs or organizations: Never     Relationship status: Married   . Intimate partner violence     Fear of current or ex partner: No     Emotionally abused: No     Physically abused: No     Forced sexual activity: No   Other Topics Concern   . Routine Exercise No   . Ability to Walk 2 Flight of Steps without SOB/CP Yes   Social History Narrative    Living situation: lives at home with husband, [name], and daughter.  1 dog named Duke and 1 cat named Lucky.    Nutrition:  Eats 5 small meals d/t gastric sleeve surgery.     Caffeine use: none, decaf coffee    Exercise: stays busy around house. No formal form of exercise    Seatbelt use: sometimes    Fire extinguishers in the home: yes    Smoke alarms in the home: yes    Carbon monoxide detectors in the home: yes    Nancy Fetter exposure: sunglasses        Tracie would like for husband, Saliha Salts,  to speak for her in the event she would become incapacitated.    Full code.      Vitals:    09/12/19 1057   BP: 100/64   Pulse: 66   Resp: 12   Temp: 35.6 C (96.1 F)   SpO2: 97%   Weight: 103 kg (228 lb)   Height: 1.676 m (_0 )   BMI: 36.88           Physical Examination:    GENERAL:   Pt is a  pleasant, well-nourished, well-developed 53 y.o. female who is in NAD. Appears stated age  17:  head normocephalic, symmetrical facies. EOM intact b/l. PERRLA. Sclera non-icteric, non-injected. upper eyelid w/Xythoma-lipid plaques, diminished in fullness and intensity.  No rhinorrhea. Oropharyngeal mucous membranes are moist  w/o erythema/exudates   NECK:   No masses, no lymphadenopathy, no JVD on exam. Trachea midline, no thyromegaly   CV: S1, S2. No murmurs, rubs, gallops.     LUNGS: CTAB, No rhonchi, rales, wheezes.   GI: (+) BS in all 4 quadrants. soft, NT/ND. No rigidity/ guarding/ rebound. No organomegaly/masses, or abdominal bruits  MSK: Spontaneous normal AROM of major joints as observed during exam. No joint effusions/ swelling/ deformities.   Left paraspinal pain on palpation L4-L5.  Some tenderness on compression of the SI joint on the left.  Left, posterior femoral acetabular joint pain on palpation.  No jump sign.  Positive hip telescoping for pain on the left.  Patient has medial joint pain about the left knee.  Medio-lateral stress does not reveal instability.  Anterior posterior translation does not reveal instability.  No BLE edema, clubbing, or cyanosis.   2+ radial pulse present b/l.   + 2 reflexes Brachioradialis and patellar bilaterally.   Gait normal.  SKIN: No significant lesions, rashes, ecchymoses noted.   NEURO: AAOx4, CN grossly intact. Sensation intact b/l. No focal deficits.  PSYCH: Mood, behavior and affect normal w/ Intact judgement & insight      Diabetes Monitors  A1C: 9.7  A1C Date: 09/12/2019           Nephropathy Screening: On ACEI or ARB    Last Lipid Panel  (Last result in the past 2 years)      Cholesterol   HDL   LDL   Direct LDL  Triglycerides      03/29/18 0958 134  Comment:  Total Cholesterol Classification:     <200        Desirable        200-239     Borderline High        > or = 240  High 52  Comment:  HDL Cholesterol Classification:          <40        Low        >  or = 60  High   52  Comment:  LDL Cholesterol Classification:         <100     Optimal       100-129  Near or Above Optimal       130-159  Borderline High       160-189  High       >190     Very High   151  Comment:  Triglyceride Classification:            <150    Normal       150-199 Borderline-High       200-499 High       >500    Very High        Retinal Exam Date: 02/01/2019  Last diabetic foot exam: 06/01/2019       Xr Lumbar Spine Series  Result Date: 08/31/2019  Lumbar spine: CLINICAL HISTORY: Chronic low back pain. 5 views of lumbar spine were obtained and compared with the prior exam of 07/15/2012. The findings are listed below.     1. Osteopenia but no definite acute fracture or destructive bony process.   2. Moderate facet arthritis in the lower lumbar spine which is grossly stable.   3. Mild degenerative disc disease most pronounced near the thoracolumbar junction. The lumbar disks are relatively well-preserved.   4. Extensive vascular calcification is noted in the aorta and iliac vessels. Radiologist location ID: [id number]       Xr Hip Left W Pelvis 2-3 Views  Result Date: 08/31/2019  Pelvis and left hip: CLINICAL HISTORY: Left hip pain with no known injury. A frontal view of the pelvis and oblique view of the left hip were obtained. The findings are listed below.     1. The bone mineralization is slightly diminished but there is no acute fracture or dislocation.  2. Mild degenerative changes involving the hips with slight sclerosis along the superior acetabular margins.   3. Heavy vascular calcification is noted. Radiologist location ID: [id number]     Xr Knee Left 4 Or More Views  Result Date: 08/31/2019  History: Knee pain. 4 views left knee are obtained. No acute fracture or dislocation is seen. There is tricompartmental osteoarthritis. There is joint space narrowing with osteophyte formation most pronounced at the patellofemoral and medial femoral tibial joints. Soft tissue calcification is seen  medially. There is no large effusion.     1. Mild degenerative changes present about the left knee with no acute bony abnormality. Radiologist location ID: [id number]       Visit Diagnosis    Encounter Diagnoses   Name Primary?   . Type 2 diabetes mellitus (CMS [place]) Yes   . Hyperlipidemia, unspecified hyperlipidemia type    . Atherosclerosis of native artery of right lower extremity with rest pain (CMS HCC)      Orders Placed This Encounter   . POCT HGB A1C   . niacin (NIASPAN) 500 mg Oral  Tablet Sustained Release   . rosuvastatin (CRESTOR) 40 mg Oral Tablet     Type 2 diabetes mellitus with complication, without long-term current use of insulin (CMS HCC)  Chronic, iworsening control  HbA1C now 9.7 up from 9.0.  She feels this is secondary to her new work environment and lack of discretion in her diet.  Continue weight loss.  Continue metformin 500 mg BID.   Continue Bydureon 2 mg every 7 days.    Continue Glyxambi 10-5 mg.    Encouraged her to check blood sugars 3-4 times daily and make a log for [place] to review at her next appointment.   Discuss renal concerns and need for monitoring.   Are treatment is focused on weight loss she agrees to lose 10 lb in the next 3 months.    Depression, unspecified depression type  Chronic, controlled.  Continue Effexor    Weight loss:  Loss has stalled.   She has previously offered to restart Adipex,   I previously declined.   I offer for her to return to walking.     GERD  Chronic, Uncontrolled problem   Encouraged lifestyle modification to help with acid reflux, including:   Weight loss   Avoid eating for 2-3 hours prior to bedtime. Avoid late night snacking.    Avoid hot/spicy and acidic foods and overeating.    Remain upright after eating, avoid tight clothing.    Elevate head of bed.   Chew gum or lozenges.   Refrain from alcohol and smoking.   Continue histamine blocker.  Continue  proton pump inhibitor.    Chronic left-sided lower extremity pain  We review her imaging  to her satisfaction.  Copies of the results are offered to the patient.  Patient with osteoarthritis  Has comorbid peripheral vascular disease  Will increase her statin follow.  Patient has follow-ups with orthopedics for the hip and knee.  Patient has follow-up with Dr. Lorra Hals for low back pain.  That appointment is not yet been scheduled.  We again discussed use of weight loss as a primary method for pain relief.  Continue on muscle relaxer.  Patient avoids nonsteroidals secondary to her bariatric surgery.      Return in about 3 months (around 12/13/2019), or if symptoms worsen or fail to improve, for Diabetes, weight loss?Cherly Beach, MD  09/12/2019, 11:18    This note was partially generated using MModal Fluency Direct system, and there may be some incorrect words, spellings, and punctuation that were not noted in checking the note before saving.

## 2019-09-12 NOTE — Nursing Note (Signed)
09/12/19 1000   A1C   Time Performed 1052   A1C 9.7   References Ranges 4 - 6%

## 2019-09-13 ENCOUNTER — Ambulatory Visit (INDEPENDENT_AMBULATORY_CARE_PROVIDER_SITE_OTHER): Payer: Self-pay | Admitting: Surgical

## 2019-09-16 ENCOUNTER — Encounter (HOSPITAL_BASED_OUTPATIENT_CLINIC_OR_DEPARTMENT_OTHER): Payer: Self-pay | Admitting: PHYSICIAN ASSISTANT

## 2019-09-16 ENCOUNTER — Encounter (INDEPENDENT_AMBULATORY_CARE_PROVIDER_SITE_OTHER): Payer: Self-pay | Admitting: Physician Assistant

## 2019-09-16 ENCOUNTER — Other Ambulatory Visit: Payer: Self-pay

## 2019-09-16 ENCOUNTER — Ambulatory Visit (INDEPENDENT_AMBULATORY_CARE_PROVIDER_SITE_OTHER): Payer: Commercial Managed Care - PPO | Admitting: Physician Assistant

## 2019-09-16 VITALS — Ht 66.0 in | Wt 228.0 lb

## 2019-09-16 DIAGNOSIS — M5416 Radiculopathy, lumbar region: Secondary | ICD-10-CM

## 2019-09-16 DIAGNOSIS — M7632 Iliotibial band syndrome, left leg: Secondary | ICD-10-CM

## 2019-09-16 DIAGNOSIS — M7062 Trochanteric bursitis, left hip: Secondary | ICD-10-CM

## 2019-09-16 DIAGNOSIS — E079 Disorder of thyroid, unspecified: Secondary | ICD-10-CM | POA: Insufficient documentation

## 2019-09-18 ENCOUNTER — Other Ambulatory Visit: Payer: Commercial Managed Care - PPO | Attending: Family Medicine | Admitting: Family Medicine

## 2019-09-18 ENCOUNTER — Encounter (INDEPENDENT_AMBULATORY_CARE_PROVIDER_SITE_OTHER): Payer: Self-pay | Admitting: Family Medicine

## 2019-09-18 ENCOUNTER — Ambulatory Visit (INDEPENDENT_AMBULATORY_CARE_PROVIDER_SITE_OTHER): Payer: Commercial Managed Care - PPO | Admitting: Family Medicine

## 2019-09-18 ENCOUNTER — Other Ambulatory Visit: Payer: Self-pay

## 2019-09-18 VITALS — BP 118/72 | HR 84 | Temp 97.6°F | Resp 16 | Ht 66.0 in | Wt 201.0 lb

## 2019-09-18 DIAGNOSIS — N390 Urinary tract infection, site not specified: Secondary | ICD-10-CM

## 2019-09-18 MED ORDER — SULFAMETHOXAZOLE 800 MG-TRIMETHOPRIM 160 MG TABLET
1.00 | ORAL_TABLET | Freq: Two times a day (BID) | ORAL | 0 refills | Status: AC
Start: 2019-09-18 — End: 2019-09-28

## 2019-09-18 MED ORDER — PHENAZOPYRIDINE 200 MG TABLET
200.00 mg | ORAL_TABLET | Freq: Three times a day (TID) | ORAL | 0 refills | Status: DC
Start: 2019-09-18 — End: 2019-11-23

## 2019-09-18 MED ORDER — LIDOCAINE HCL 10 MG/ML (1 %) INJECTION SOLUTION
1.0000 g | Freq: Once | INTRAMUSCULAR | 0 refills | Status: AC
Start: 2019-09-18 — End: 2019-09-18

## 2019-09-18 NOTE — Progress Notes (Signed)
Department of [facility]  [address]  [place] [address]  [phone]    Patient Name: Monique [name]  Date: 09/18/2019  MRN: [id number]  DOB: [birth year]    Chief complaint: Pain on Urination (3 days); Urinary Frequency; and Flank Pain.    HPI:  The patient is a 53 y.o. old female who presents today with Pain on Urination (3 days); Urinary Frequency; and Flank Pain.  Pt states her sugars have been doing well.  No fever or chills.  Currently not treating.            Past medical history:  Past Medical History:   Diagnosis Date   . Anxiety    . Carotid stenosis    . Chronic back pain    . Coronary artery disease involving native coronary artery of native heart    . DDD (degenerative disc disease), lumbar     "entire spine"   . Ear piercing    . GERD (gastroesophageal reflux disease)    . H/O complete eye exam     2 years, Dr. [name], at [facility]   . History of dental examination     dentures upper and lower   . HTN    . Hypercholesterolemia    . Hyperlipidemia    . MI (myocardial infarction) (CMS [facility]) 2012   . Neck problem     herniated cervical disc   . Obesity (BMI 30-39.9)    . 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 [place] ordered.    . Stroke (CMS [facility])     2002   . Tattoo     Left breast, Right shoulder   . Thyroid disorder    . Thyroid nodule    . Uncontrolled type 2 diabetes mellitus, without long-term current use of insulin (CMS [place])    . Xanthelasma          Past surgical history:  Past Surgical History:   Procedure Laterality Date   . ENDOMETRIAL ABLATION  1998   . HX ANKLE FRACTURE TX  1990s    Right, Doran Durand procedure   . HX CATARACT REMOVAL Bilateral 2013    r and l eyes with implants   . HX CESAREAN SECTION  06/20/2000    x3, 11/21/86, 11/10/84   . HX CHOLECYSTECTOMY  1988   . HX CORONARY ARTERY BYPASS GRAFT  03/31/2011    cardiac, 2 vessel, Dr. [name], [facility]   . HX CORONARY STENT PLACEMENT   2012    x4 stents   . HX GASTRIC SLEEVE  02/2017    [place], [place], Dr. Pearlie Oyster   . HX HEART CATHETERIZATION  2012    massive heart attack and stents - [facility]   . HX THYROID BIOPSY     . HX THYROIDECTOMY  101/01/2018   . HX TONSILLECTOMY      as child   . HX YAG Left 09/20/2013         Medications:  Current Outpatient Medications   Medication Sig   . ACCU-CHEK AVIVA PLUS TEST STRP Strip 1 Strip by Subcutaneous route Three times a day as needed   . Blood-Glucose Meter (ACCU-CHEK AVIVA PLUS METER) Misc 1 Kit by Does not apply route Three times a day   . cefTRIAXone 1 g in lidocaine 2.86 mL IM injection 2.86 mL (1 g total) by IntraMUSCULAR route One time for 1 dose Final  concentration is 350 mg/mL; reconstitute 1g vial with 2.1 mL NS or 1% lidocaine.   . clopidogreL (PLAVIX) 75 mg Oral Tablet Take 1 Tab (75 mg total) by mouth Once a day   . cyanocobalamin (VITAMIN B12) 1,000 mcg/mL Injection Solution 1 mL (1,000 mcg total) by Subcutaneous route Every 30 days   . cyanocobalamin (VITAMIN B12) 1,000 mcg/mL Injection Solution INJECT 1 MILLILITER( 1000 MCG) INTRAMUSCULARLY EVERY MONTH   . diclofenac sodium (VOLTAREN) 1 % Gel 2 g by Apply Topically route Three times a day as needed   . docusate sodium (COLACE) 100 mg Oral Capsule Take 1 Cap (100 mg total) by mouth Twice daily   . empagliflozin-linagliptin (GLYXAMBI) 10-5 mg Oral Tablet TAKE 1 TABLET BY MOUTH ONCE DAILY   . ergocalciferol, vitamin D2, (DRISDOL) 1,250 mcg (50,000 unit) Oral Capsule TAKE 1 CAPSULE BY MOUTH EVERY WEEK   . exenatide microspheres ER (BYDUREON) 2 mg Subcutaneous 2 mg by Subcutaneous route Every 7 days   . flash glucose scanning reader (FREESTYLE LIBRE 14 DAY READER) Does not apply Misc 1 Box by Does not apply route Three times a day   . flash glucose sensor (FREESTYLE LIBRE 14 DAY SENSOR) Does not apply Kit 1 Kit by Does not apply route Three times a day   . furosemide (LASIX) 40 mg Oral Tablet Take 1 Tab (40 mg total) by mouth Once a day   .  HYDROcodone-acetaminophen (NORCO) 10-325 mg Oral Tablet Take 1 Tab by mouth Four times a day    . Ibuprofen (MOTRIN) 800 mg Oral Tablet Take 1 Tab (800 mg total) by mouth Three times a day as needed for Pain   . levothyroxine (SYNTHROID) 25 mcg Oral Tablet Take 25 mcg by mouth Once a day   . lisinopriL (PRINIVIL) 2.5 mg Oral Tablet 1 q day   . metFORMIN (GLUCOPHAGE) 500 mg Oral Tablet take 1 tablet by mouth twice a day with food   . methocarbamoL (ROBAXIN) 500 mg Oral Tablet Take 1 Tab (500 mg total) by mouth Three times a day as needed (muscle spasms)   . metoprolol succinate (TOPROL-XL) 25 mg Oral Tablet Sustained Release 24 hr TAKE 1/2 TABLET ONCE DAILY   . NARCAN 4 mg/actuation Nasal Spray, Non-Aerosol PT STATED SHE HAS NARCAN AT HOME BUT IS NOT TAKING IT   . niacin (NIASPAN) 500 mg Oral Tablet Sustained Release Take 1 Tab (500 mg total) by mouth Once a day   . nitroGLYCERIN (NITROSTAT) 0.4 mg Sublingual Tablet, Sublingual 1 Tab (0.4 mg total) by Sublingual route Every 5 minutes as needed for Chest pain for 3 doses over 15 minutes   . omeprazole (PRILOSEC) 40 mg Oral Capsule, Delayed Release(E.C.) Take 1 Cap (40 mg total) by mouth Once a day   . ondansetron (ZOFRAN ODT) 8 mg Oral Tablet, Rapid Dissolve DISSOLVE 1 TABLET(8 MG) ON THE TONGUE EVERY 8 HOURS AS NEEDED FOR NAUSEA OR VOMITING   . phenazopyridine (PYRIDIUM) 200 mg Oral Tablet Take 1 Tab (200 mg total) by mouth Three times a day   . PRENATAL PLUS, CALCIUM CARB, 27 mg iron- 1 mg Oral Tablet Take 1 Tab by mouth Once a day   . PROVENTIL HFA 90 mcg/actuation Inhalation HFA Aerosol Inhaler Take 1-2 Puffs by inhalation Every 4 hours as needed   . rosuvastatin (CRESTOR) 40 mg Oral Tablet Take 1 Tab (40 mg total) by mouth Once a day   . trimethoprim-sulfamethoxazole (BACTRIM DS) 160-868m per tablet Take 1 Tab (160  mg total) by mouth Twice daily for 10 days   . venlafaxine (EFFEXOR XR) 37.5 mg Oral Capsule, Sust. Release 24 hr Take 1 Cap (37.5 mg total) by mouth  Once a day   . VENTOLIN HFA 90 mcg/actuation Inhalation HFA Aerosol Inhaler Take 1-2 Puffs by inhalation Every 6 hours as needed   . ZYRTEC-D 5-120 mg Oral Tablet Sustained Release 12 hr Take 1 Tab by mouth Once a day     Allergies:  Allergies   Allergen Reactions   . Ane Payment [Pyrilamine-Dextromethorphan]  Other Adverse Reaction (Add comment)     LIGHT HEADED   . Tylenol W Codeine [Acetaminophen-Codeine]  Other Adverse Reaction (Add comment)     pt states that one time she had chest pains with this med, but dr. thought it was because she had not eaten     Family history:  Family Medical History:     Problem Relation (Age of Onset)    Atrial fibrillation Sister    Colon Cancer Father    Diabetes Mother, Maternal Aunt    Heart Disease Sister, Maternal Grandfather    Hypertension (High Blood Pressure) Mother    Lung Cancer Maternal Grandfather    MI <45 years of age Mother            Social history:  Social History     Socioeconomic History   . Marital status: Married     Spouse name: [name]   . Number of children: 3   . Years of education: completed 11th grade   . Highest education level: Not on file   Occupational History   . Occupation: disabled   Social Needs   . Financial resource strain: Not hard at all   . Food insecurity     Worry: Never true     Inability: Never true   . Transportation needs     Medical: No     Non-medical: No   Tobacco Use   . Smoking status: Current Every Day Smoker     Packs/day: 0.50     Years: 20.00     Pack years: 10.00     Types: Cigarettes     Start date: 46   . Smokeless tobacco: Never Used   Substance and Sexual Activity   . Alcohol use: No     Frequency: Never     Binge frequency: Never   . Drug use: No   . Sexual activity: Yes     Partners: Male   Lifestyle   . Physical activity     Days per week: 0 days     Minutes per session: 0 min   . Stress: To some extent   Relationships   . Social connections     Talks on phone: More than three times a week     Gets together: More than  three times a week     Attends religious service: Never     Active member of club or organization: No     Attends meetings of clubs or organizations: Never     Relationship status: Married   . Intimate partner violence     Fear of current or ex partner: No     Emotionally abused: No     Physically abused: No     Forced sexual activity: No   Other Topics Concern   . Routine Exercise No   . Ability to Walk 2 Flight of Steps without SOB/CP Yes   Social History Narrative  Living situation: lives at home with husband, [name], and daughter.  1 dog named Duke and 1 cat named Lucky.    Nutrition:  Eats 5 small meals d/t gastric sleeve surgery.     Caffeine use: none, decaf coffee    Exercise: stays busy around house. No formal form of exercise    Seatbelt use: sometimes    Fire extinguishers in the home: yes    Smoke alarms in the home: yes    Carbon monoxide detectors in the home: yes    Nancy Fetter exposure: sunglasses        Aime would like for husband, Lexey Fletes,  to speak for her in the event she would become incapacitated.    Full code.        ROS:    Constitutional:  No fever or chills  Skin:  No rashes or lesions  HENT:  No sore throat, otalgia, or dysphagia  Eyes:  No vision changes, redness, or discharge  CV:  No chest pain or palpitations  Respiratory:  No cough, wheezing, or shortness of breath  GI:  No nausea, vomiting, diarrhea, constipation, or abdominal pain  GU:  See HPI      Physical Exam:    Vital signs:   Vitals:    09/18/19 1427   BP: 118/72   Pulse: 84   Resp: 16   Temp: 36.4 C (97.6 F)   SpO2: 96%   Weight: 91.2 kg (201 lb)   Height: 1.676 m (5' 6" )   BMI: 32.51         Body mass index is 32.44 kg/m.    General: Well-appearing. No apparent distress. Does not appear toxic.    Skin: Warm and dry. Good skin turgor. No rash is seen.  Cardiovascular: Regular rate and rhythm. Normal S1 and S2. No murmurs rubs or gallops were auscultated.  Lungs: clear without wheezes, rales or rhonchi.  Abdomen:  Soft,  bowel sounds normal, no rebound. +suprapubic tenderness.  Back: Bilateral CVAT    Urine Dip Results:   Time collected: 1422  Glucose: (!) 4+ (2000 mg/dl)  Bilirubin: Negative  Ketones: (!) Trace (5 mg/dl)  Urine Specific Gravity: 1.020  Blood (urine): (!) Large (3+)  pH: 6.0  Protein: (!) 1+ (67m/dL)  Urobilinogen: 0.288mdL (Normal)  Nitrite: (?)  Leukocytes: (!) Moderate        Visit Diagnosis  UTI (urinary tract infection) / low-grade pyelonephritis    Plan  Orders Placed This Encounter   . URINE CULTURE   . cefTRIAXone 1 g in lidocaine 2.86 mL IM injection   . trimethoprim-sulfamethoxazole (BACTRIM DS) 160-80059mer tablet   . phenazopyridine (PYRIDIUM) 200 mg Oral Tablet     Rest, fluids, Tylenol or Ibuprofen for pain/fever  Routine UTI instructions given to pt  Follow up with PCP; recheck here prn.    Plan was discussed and patient verbalized understanding.  If symptoms are worsening or not improving the patient should either return here for further evaluation, contact their PCP, or go to the Emergency Department for further evaluation.      RobAmbrose MantleD  09/18/2019, 14:52  Electronically signed by RobAmbrose MantleD    This note was partially generated using MModal Fluency Direct system, and there may be some incorrect words, spellings, and punctuation that were not noted in checking the note before saving

## 2019-09-18 NOTE — Nursing Note (Signed)
09/18/19 1400   Urine test  (S[employer]ens Multistix 10 SG)   Time collected 1422   Color Dark  (Tea colored)   Clarity Cloudy  (with sediment)   Glucose (!) 4+ (2000 mg/dl)   Bilirubin Negative   Ketones (!) Trace (5 mg/dl)   Urine Specific Gravity 1.020   Blood (urine) (!) Large (3+)   pH 6.0   Protein (!) 1+ (30mg /dL)   Urobilinogen 0.2mg /dL (Normal)   Nitrite   (?)   Leukocytes (!) 2+   Performed Status Manual   Lot # [id number]   Expiration Date 08/07/20   Initials [facility]

## 2019-09-18 NOTE — Patient Instructions (Addendum)
Rx's as directed.   Increase water intake  Decrease caffeine and tea intake.  Tylenol or ibuprofen needed for pain or fever.  A urine culture was ordered, and this office will contact you with the results and with any further instructions.  Follow up with your primary care provider for further evaluation and treatment if symptoms not improving in 3-4 days, or recheck here.          Understanding Urinary Tract Infections (UTIs)   Most UTIs are caused by bacteria, but they may also be caused by viruses or fungi. Bacteria from the bowel are the most common source of infection.The infection may start because of any of the following:    Sexual activity.  During sex, bacteria can travel from the penis, vagina, or rectum into the urethra.   Bacteria outside the rectum getting into the urethra.  Bacteria on the skinoutside the rectum may travel into the urethra. This is more common in women since the rectum and urethra are closer to each other than in men. Wiping from front to back after using the toilet and keeping the area clean can help prevent germs from getting to the urethra.   Blocked urine flow through the urinary tract. If urine sits too long, germs may start to grow out of control.      Parts of the urinary tract  The infection canoccur in any part of the urinary tract.    The kidneys.  These organs collect and store urine.   The ureters. These tubes carry urine from the kidneys to the bladder.   The bladder.  This holds urine until you are ready to let it out.   The urethra. This tube carries urine from the bladder out of the body. It is shorter in women, so bacteria can move throughit more easily.The urethra is longer in men, so a UTI is less likely to reach the bladder or kidneys in men.  [employer] last reviewed this educational content on 12/08/2018   2000-2020 [employer]. [address], [place] [address]. All rights reserved. This information is not intended as a substitute  for professional medical care. Always follow your healthcare professional's instructions.

## 2019-09-18 NOTE — Progress Notes (Signed)
[facility]  [address]  [place] [address]    New Patient Evaluation    Name: Monique [name] MRN:  [id number]   Date: 09/16/2019 Age: 53 y.o.     Reason for Visit: New Patient (Troch anteric bursitis of left hip, sxs 6/8 mnths, pt was using hydrocodone for pain, she has used biofreeze, voltaren gel as well for )    History of Present Illness:  Monique [name] is a 53 y.o. female who is a new patient being seen today for evaluation of left hip pain. Reports pain on the lateral and posterior aspect of the hip that has been going on for at least 6-8 months without known injury or precipitating event. Denies groin pain. She does report a history of chronic back pain with radicular symptoms and previously followed with pain management clinic. Lateral hip pain is sometimes sharp and sometimes burning/dull/achy. Pain is worse with long periods of sitting, standing, and walking. Pain increased with laying on her right side with the left leg stretching across the right leg. She is able to comfortably lay on her left side. Current pain 8/10. She has been using Biofreeze and Voltaren gel without significant relief. Unable to take PO NSAIDs due to chronic Plavix therapy and history bariatric surgery. Currently taking Hydrocodone for back and hip pain.    Medical History   I have reviewed and updated as appropriate the past medical, surgical, family and social history today:    Past Medical History:   Diagnosis Date   . Anxiety    . Carotid stenosis    . Chronic back pain    . Coronary artery disease involving native coronary artery of native heart    . DDD (degenerative disc disease), lumbar     "entire spine"   . Ear piercing    . GERD (gastroesophageal reflux disease)    . H/O complete eye exam     2 years, Dr. [name], at [facility]   . History of dental examination     dentures upper and lower   . HTN    . Hypercholesterolemia    . Hyperlipidemia    . MI (myocardial infarction) (CMS  [place]) 2012   . Neck problem     herniated cervical disc   . Obesity (BMI 30-39.9)    . 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 [place] ordered.    . Stroke (CMS [facility])     2002   . Tattoo     Left breast, Right shoulder   . Thyroid disorder    . Thyroid nodule    . Uncontrolled type 2 diabetes mellitus, without long-term current use of insulin (CMS [place])    . Xanthelasma           Past Surgical History:   Procedure Laterality Date   . AUTOLOGOUS BLOOD CONSERVATION SETUP N/A 03/31/2011    Performed by Sydnee Levans, MD at [place]   . AUTOLOGOUS BLOOD CONSERVATION, MONITORING PER HOUR N/A 03/31/2011    Performed by Sydnee Levans, MD at [place]   . BYPASS GRAFT CORONARY ARTERY N/A 03/31/2011    Performed by Sydnee Levans, MD at [place]   . HARVEST VEIN SAPHENOUS ENDOSCOPIC Right 03/31/2011    Performed by Sydnee Levans, MD at [place]   .  HX ANKLE FRACTURE TX  1990s    Right, Doran Durand procedure   . HX CATARACT REMOVAL Bilateral 2013    r and l eyes with implants   . HX CESAREAN SECTION  06/20/2000    x3, 11/21/86, 11/10/84   . HX CHOLECYSTECTOMY  1988   . HX CORONARY ARTERY BYPASS GRAFT  03/31/2011    cardiac, 2 vessel, Dr. [name], [facility]   . HX CORONARY STENT PLACEMENT  2012    x4 stents   . HX GASTRIC SLEEVE  02/2017    [place], [place], Dr. Pearlie Oyster   . HX HEART CATHETERIZATION  2012    massive heart attack and stents - [facility]   . HX THYROID BIOPSY     . HX THYROIDECTOMY  101/01/2018   . HX TONSILLECTOMY      as child   . HX YAG Left 09/20/2013   . PERFUSION CHARGE/STBY/CARDIOPULMONARY BYPASS N/A 03/31/2011    Performed by Sydnee Levans, MD at [place]   . Right Thyroid Lobectomy N/A 09/07/2018    Performed by Drue Novel, MD at [place]     Current Outpatient Medications   Medication Sig   . ACCU-CHEK AVIVA PLUS TEST STRP Strip 1 Strip by Subcutaneous  route Three times a day as needed   . Blood-Glucose Meter (ACCU-CHEK AVIVA PLUS METER) Misc 1 Kit by Does not apply route Three times a day   . clopidogreL (PLAVIX) 75 mg Oral Tablet Take 1 Tab (75 mg total) by mouth Once a day   . cyanocobalamin (VITAMIN B12) 1,000 mcg/mL Injection Solution 1 mL (1,000 mcg total) by Subcutaneous route Every 30 days   . cyanocobalamin (VITAMIN B12) 1,000 mcg/mL Injection Solution INJECT 1 MILLILITER( 1000 MCG) INTRAMUSCULARLY EVERY MONTH   . diclofenac sodium (VOLTAREN) 1 % Gel 2 g by Apply Topically route Three times a day as needed   . docusate sodium (COLACE) 100 mg Oral Capsule Take 1 Cap (100 mg total) by mouth Twice daily   . empagliflozin-linagliptin (GLYXAMBI) 10-5 mg Oral Tablet TAKE 1 TABLET BY MOUTH ONCE DAILY   . ergocalciferol, vitamin D2, (DRISDOL) 1,250 mcg (50,000 unit) Oral Capsule TAKE 1 CAPSULE BY MOUTH EVERY WEEK   . exenatide microspheres ER (BYDUREON) 2 mg Subcutaneous 2 mg by Subcutaneous route Every 7 days   . flash glucose scanning reader (FREESTYLE LIBRE 14 DAY READER) Does not apply Misc 1 Box by Does not apply route Three times a day   . flash glucose sensor (FREESTYLE LIBRE 14 DAY SENSOR) Does not apply Kit 1 Kit by Does not apply route Three times a day   . furosemide (LASIX) 40 mg Oral Tablet Take 1 Tab (40 mg total) by mouth Once a day   . HYDROcodone-acetaminophen (NORCO) 10-325 mg Oral Tablet Take 1 Tab by mouth Four times a day    . Ibuprofen (MOTRIN) 800 mg Oral Tablet Take 1 Tab (800 mg total) by mouth Three times a day as needed for Pain   . levothyroxine (SYNTHROID) 25 mcg Oral Tablet Take 25 mcg by mouth Once a day   . lisinopriL (PRINIVIL) 2.5 mg Oral Tablet 1 q day   . metFORMIN (GLUCOPHAGE) 500 mg Oral Tablet take 1 tablet by mouth twice a day with food   . methocarbamoL (ROBAXIN) 500 mg Oral Tablet Take 1 Tab (500 mg total) by mouth Three times a day as needed (muscle spasms)   . metoprolol succinate (TOPROL-XL) 25 mg Oral  Tablet Sustained  Release 24 hr TAKE 1/2 TABLET ONCE DAILY   . NARCAN 4 mg/actuation Nasal Spray, Non-Aerosol PT STATED SHE HAS NARCAN AT HOME BUT IS NOT TAKING IT   . niacin (NIASPAN) 500 mg Oral Tablet Sustained Release Take 1 Tab (500 mg total) by mouth Once a day   . nitroGLYCERIN (NITROSTAT) 0.4 mg Sublingual Tablet, Sublingual 1 Tab (0.4 mg total) by Sublingual route Every 5 minutes as needed for Chest pain for 3 doses over 15 minutes   . omeprazole (PRILOSEC) 40 mg Oral Capsule, Delayed Release(E.C.) Take 1 Cap (40 mg total) by mouth Once a day   . ondansetron (ZOFRAN ODT) 8 mg Oral Tablet, Rapid Dissolve DISSOLVE 1 TABLET(8 MG) ON THE TONGUE EVERY 8 HOURS AS NEEDED FOR NAUSEA OR VOMITING   . PRENATAL PLUS, CALCIUM CARB, 27 mg iron- 1 mg Oral Tablet Take 1 Tab by mouth Once a day   . PROVENTIL HFA 90 mcg/actuation Inhalation HFA Aerosol Inhaler Take 1-2 Puffs by inhalation Every 4 hours as needed   . rosuvastatin (CRESTOR) 40 mg Oral Tablet Take 1 Tab (40 mg total) by mouth Once a day   . venlafaxine (EFFEXOR XR) 37.5 mg Oral Capsule, Sust. Release 24 hr Take 1 Cap (37.5 mg total) by mouth Once a day   . VENTOLIN HFA 90 mcg/actuation Inhalation HFA Aerosol Inhaler Take 1-2 Puffs by inhalation Every 6 hours as needed   . ZYRTEC-D 5-120 mg Oral Tablet Sustained Release 12 hr Take 1 Tab by mouth Once a day       Allergies  Allergies   Allergen Reactions   . Ane Payment [Pyrilamine-Dextromethorphan]  Other Adverse Reaction (Add comment)     LIGHT HEADED   . Tylenol W Codeine [Acetaminophen-Codeine]  Other Adverse Reaction (Add comment)     pt states that one time she had chest pains with this med, but dr. thought it was because she had not eaten       Family Medical History:     Problem Relation (Age of Onset)    Atrial fibrillation Sister    Colon Cancer Father    Diabetes Mother, Maternal Aunt    Heart Disease Sister, Maternal Grandfather    Hypertension (High Blood Pressure) Mother    Lung Cancer Maternal Grandfather    MI <28  years of age Mother            Social History     Tobacco Use   . Smoking status: Current Every Day Smoker     Packs/day: 0.50     Years: 20.00     Pack years: 10.00     Types: Cigarettes     Start date: 34   . Smokeless tobacco: Never Used   Substance Use Topics   . Alcohol use: No     Frequency: Never     Binge frequency: Never   . Drug use: No       Review of Systems:  Review of Systems   Constitutional: Negative for chills and fever.   Respiratory: Negative for cough, shortness of breath and wheezing.    Cardiovascular: Negative for chest pain and palpitations.   Gastrointestinal: Negative for nausea and vomiting.   Musculoskeletal: Positive for back pain, joint pain, myalgias and neck pain. Negative for falls.   Skin: Negative for itching and rash.   Neurological: Negative for tingling and focal weakness.         Objective     Physical Exam:  Ht 1.676 m (5' 6" )   Wt 103 kg (228 lb)   LMP  (LMP Unknown)   BMI 36.80 kg/m     Physical Exam   Constitutional: She is oriented to person, place, and time and well-developed, well-nourished, and in no distress.   HENT:   Head: Normocephalic and atraumatic.   Pulmonary/Chest: Effort normal. No respiratory distress.   Musculoskeletal:      Comments: Left hip examination: TTP along midline lumbosacral spine. Left SI joint TTP. Mild tenderness along the GT bursal location and along the IT band proximally. No tenderness at the lateral knee or distal IT band insertion. Tolerates gentle PROM of the hip. Positive FABER. Negative FADIR. Hip adduction recreates lateral hip pain. Grossly NVI distally.   Neurological: She is alert and oriented to person, place, and time.   Skin: Skin is warm and dry.          Imaging:   XR lumbar series 08/31/2019 at [facility]. Radiologist report indicates moderate facet arthritis with mild DDD in the thoracolumbar junction. Extensive vascular calcifications.    XR left knee 08/31/2019 at [facility]. Imaging and radiologist report reviewed with mild  degenerative changes noted. Non-weight bearing images.    XR left hip/pelvis 08/31/2019 at [facility]. Mild degenerative changes of bilateral hips with slight acetabular sclerosis. Heavy vascular calcification.      Assessment     ENCOUNTER DIAGNOSES     ICD-10-CM   1. 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 Trochanteric bursitis of left hip  M70.62   3. Lumbar radiculopathy  M54.16        Plan     Discussed with patient that her pain is likely multi-factorial- Lumbar radiculopathy, SI joint, IT band, troch bursitis. She is scheduled to follow-up with pain management for chronic back pain. Feel that she could benefit from PT modalities focused on low back, IT band, and abductors. We also discussed considering CS injection of the troch bursa if no relief with PT. Patient reports wishing to defer injection if possible due to poorly controlled DM type 2 (most recent HbA1C 10.4%). Encouraged continued conservative treatments for mild OA back, hips, and knee on imaging and importance of weight loss and management of comorbid DM2, PVD, and tobacco use.    Follow up: Return in about 6 weeks (around 10/28/2019).    I am seeing this patient independently with supervising physician available for direct consultation as needed.    Ardelle Balls, PA-C        This note was partially generated using MModal Fluency Direct system, and there may be some incorrect words, spellings, and punctuation that were not noted in checking the note before saving

## 2019-09-18 NOTE — Nursing Note (Signed)
09/18/19 1400   Medication Administration   Initials cb   Medication  Rocephin   Medication Dose Given 1 gram   Medication Volume Given (mL) 2.49ml   Route of Administration IM   Site Right Gluteus   NDC # [id number]   LOT # [id number]   Expiration date 04/06/20   Manufacturer WG Critical   Clinic Supplied Yes   Patient Supplied No   Comments: tol well

## 2019-09-18 NOTE — Addendum Note (Signed)
Addended by: Lianne Moris on: 09/18/2019 02:37 PM     Modules accepted: Orders

## 2019-09-20 LAB — URINE CULTURE: URINE CULTURE: >100000 — AB

## 2019-09-22 ENCOUNTER — Ambulatory Visit (HOSPITAL_BASED_OUTPATIENT_CLINIC_OR_DEPARTMENT_OTHER): Payer: Commercial Managed Care - PPO

## 2019-09-22 ENCOUNTER — Encounter (HOSPITAL_BASED_OUTPATIENT_CLINIC_OR_DEPARTMENT_OTHER): Payer: Self-pay | Admitting: PHYSICIAN ASSISTANT

## 2019-09-23 ENCOUNTER — Other Ambulatory Visit: Payer: Self-pay

## 2019-09-24 ENCOUNTER — Other Ambulatory Visit (HOSPITAL_BASED_OUTPATIENT_CLINIC_OR_DEPARTMENT_OTHER): Payer: Self-pay | Admitting: Cardiovascular Disease

## 2019-09-24 DIAGNOSIS — I10 Essential (primary) hypertension: Secondary | ICD-10-CM

## 2019-09-26 ENCOUNTER — Ambulatory Visit (INDEPENDENT_AMBULATORY_CARE_PROVIDER_SITE_OTHER): Payer: Self-pay | Admitting: Rehabilitative and Restorative Service Providers"

## 2019-09-27 ENCOUNTER — Encounter (HOSPITAL_BASED_OUTPATIENT_CLINIC_OR_DEPARTMENT_OTHER): Payer: Self-pay | Admitting: CARDIOVASCULAR DISEASE

## 2019-09-29 ENCOUNTER — Other Ambulatory Visit: Payer: Self-pay

## 2019-09-29 ENCOUNTER — Ambulatory Visit (INDEPENDENT_AMBULATORY_CARE_PROVIDER_SITE_OTHER): Payer: Commercial Managed Care - PPO | Admitting: Rehabilitative and Restorative Service Providers"

## 2019-09-29 DIAGNOSIS — M7632 Iliotibial band syndrome, left leg: Secondary | ICD-10-CM

## 2019-09-29 DIAGNOSIS — M5416 Radiculopathy, lumbar region: Secondary | ICD-10-CM

## 2019-09-29 DIAGNOSIS — M7062 Trochanteric bursitis, left hip: Secondary | ICD-10-CM

## 2019-09-29 NOTE — Progress Notes (Signed)
Physical Therapy, [facility]  [address]  [place] [address]  [phone]  Physical Therapy Progress Note       Patient Name: Monique [name]  Date of Birth: [birth year]  Height:     Weight:         Subjective & Objective:   Subjective Evaluation    History of Present Illness  Mechanism of injury: Chronic/Repetitive     Pt presents to PT d/t L hip pain w/ potential ITB syndrome that she has been suffering w/ for several months. She reports that she does not have a MOI and that her pain has just gradually came on at the hip. She experiences most of her pain at night when laying on her R side as she can only lay there for about 10-15 minutes until her sxs exacerbate. 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 She also reports that at times her L LE can feel weak and her knee can actually give out and buckle on her, but w/ no recent falls. She has not had any formal PT or injection treatments for her L hip dysfunction. Only conservative methods that have been used are rest, ice, and medication.    Quality of life: good    Pain  Current pain rating: 2  At best pain rating: 2  At worst pain rating: 10  Location: L hip   Quality: pressure, pulling, discomfort, tight and throbbing  Relieving factors: ice, medications and rest  Aggravating factors: movement and lifting (WB)  Progression: worsening    Treatments  Previous treatment: medication  Current treatment: medication and physical therapy  Patient Goals  Patient goals for therapy: decreased edema, decreased pain, improved balance, increased motion, increased strength, independence with ADLs/IADLs and return to sport/leisure activities         AMB KNEE EXERCISES 09/29/2019   Visit Number 1   Precautions eval on 09/29/2019   Additional Standing Exercises L ITB stretch 5x10"   Ball Squeeze 2'   Theraband Abd RTB 2'   Abduction  Slides L 10x       Assessment:   Objective     Neurological Testing     Additional Neurological Details  No numbness or tingling noted.    Palpation   Left   Tenderness of the gluteus maximus, gluteus medius and TFL.     Tenderness     Left Hip   Tenderness in the greater trochanter.     Active Range of Motion   Left Hip   Flexion: 110 degrees   Abduction: 18 degrees with pain  External rotation (90/90): 35 degrees with pain  Internal rotation (90/90): 32 degrees with pain    Right Hip   Flexion: WFL  Abduction: WFL  External rotation (90/90): 38 degrees   Internal rotation (90/90): 32 degrees     Strength/Myotome Testing     Left Hip   Planes of Motion   Flexion: 4+  Extension: 4+  Abduction: 4- (Pain)  Adduction: 4    Right Hip   Planes of Motion   Flexion: 4+  Extension: 5  Abduction: 5  Adduction: 5    Left Knee   Flexion: 5  Extension: 5    Right Knee   Flexion: 5  Extension: 5    Tests     Left Hip  Positive FABER, modified Nicoletta Dress and Ober.   Negative FADIR and scour.     Ambulation     Ambulation: Stairs   Ascend stairs: independent  Pattern: non-reciprocal  Descend stairs: independent  Pattern: non-reciprocal    Observational Gait   Gait: antalgic and asymmetric   Decreased walking speed and left stance time.     Past Medical History:   Diagnosis Date   . Anxiety    . Carotid stenosis    . Chronic back pain    . Coronary artery disease involving native coronary artery of native heart    . DDD (degenerative disc disease), lumbar     "entire spine"   . Ear piercing    . GERD (gastroesophageal reflux disease)    . H/O complete eye exam     2 years, Dr. [name], at [facility]   . History of dental examination     dentures upper and lower   . HTN    . Hypercholesterolemia    . Hyperlipidemia    . MI (myocardial infarction) (CMS [place]) 2012   . Neck problem     herniated cervical disc   . Obesity (BMI 30-39.9)    . Solitary nodule of right lobe of thyroid 03/30/2018    Incidental finding on MRI thoracic  spine. [place] ordered.    . Stroke (CMS [facility])     2002   . Tattoo     Left breast, Right shoulder   . Thyroid disorder    . Thyroid nodule    . Uncontrolled type 2 diabetes mellitus, without long-term current use of insulin (CMS [place])    . Xanthelasma      Past Surgical History:   Procedure Laterality Date   . AUTOLOGOUS BLOOD CONSERVATION SETUP N/A 03/31/2011    Performed by Sydnee Levans, MD at [place]   . AUTOLOGOUS BLOOD CONSERVATION, MONITORING PER HOUR N/A 03/31/2011    Performed by Sydnee Levans, MD at [place]   . BYPASS GRAFT CORONARY ARTERY N/A 03/31/2011    Performed by Sydnee Levans, MD at [place]   . HARVEST VEIN SAPHENOUS ENDOSCOPIC Right 03/31/2011    Performed by Sydnee Levans, MD at [place]   . HX ANKLE FRACTURE TX  1990s    Right, Doran Durand procedure   . HX CATARACT REMOVAL Bilateral 2013    r and l eyes with implants   . HX CESAREAN SECTION  06/20/2000    x3, 11/21/86, 11/10/84   . HX CHOLECYSTECTOMY  1988   . HX CORONARY ARTERY BYPASS GRAFT  03/31/2011    cardiac, 2 vessel, Dr. [name], [facility]   . HX CORONARY STENT PLACEMENT  2012    x4 stents   . HX GASTRIC SLEEVE  02/2017    [place], [place], Dr. Pearlie Oyster   . HX HEART CATHETERIZATION  2012    [facility] - [facility]   . HX THYROID BIOPSY     . HX THYROIDECTOMY  101/01/2018   . HX TONSILLECTOMY      as child   . HX YAG Left 09/20/2013   . PERFUSION CHARGE/STBY/CARDIOPULMONARY BYPASS N/A 03/31/2011    Performed by Sydnee Levans, MD at [place]   . Right Thyroid Lobectomy N/A 09/07/2018    Performed by Drue Novel, MD at [place]  Plan:   Assessment & Plan     Assessment  Impairments: abnormal coordination, abnormal gait, abnormal muscle firing, abnormal or restricted ROM, activity intolerance, impaired balance, impaired physical strength, lacks appropriate home exercise program, pain  with function, safety issue and weight-bearing intolerance  Assessment details: Pt is a 53 year old female who is referred to OP PT d/t L hip pain w/ potential ITB syndrome that she has been suffering w/ for several months. Upon examination the pt demonstrates a decrease in strength throughout the L hip per MMT w/ pain noted w/ abduction strength testing. She demonstrates a decrease in AROM at the L hip only w/ hip abduction per goniometric measurements. She ambulates w/ an antalgic gait pattern d/t the extra stress at the L hip during her stance phase of gait at the L LE. She did have noted positive orthopedic special tests that were indicative of a potential L ITB pathology or L hip bursitis. She would benefit from a trial of skilled PT services to address the mentioned deficits as her quality of life is being affected.    Dx: Decrease in strength, hip mobility, and gait ability secondary d/t L ITB syndrome/bursitis.  Prognosis: good    Goals  LTG (12 weeks)  1. Pt will report <2/10 pain for an improved overall function   2. Pt will demonstrate 5/5 strength throughout the L hip for an improved ability to perform ADLs  3. Pt will demonstrate WFL AROM at the L hip in all planes for an improved overall hip mobility   4. Pt will demonstrate a normalized gait pattern for a more efficient gait sequence   5. Pt will be able to go up and down the stairs w/ a reciprocal pattern for an improved overall safety   6. Pt will be independent w/ a HEP    Plan  Therapy options: will be seen for skilled physical therapy services  Planned modality interventions: cryotherapy, electrical stimulation/Russian stimulation, iontophoresis, TENS, thermotherapy (hydrocollator packs), traction and ultrasound  Planned therapy interventions: balance/weight-bearing training, body mechanics training, fine motor coordination training, flexibility, functional ROM exercises, gait training, home exercise program, joint mobilization, manual therapy,  motor coordination training, neuromuscular re-education, postural training, soft tissue mobilization, spinal/joint mobilization, strengthening, stretching and therapeutic activities  Other planned therapy interventions: Aquatic Therapy, Dry Needling  Frequency: 2x week  Duration in weeks: 12  Treatment plan discussed with: patient        Continue to follow patient according to established plan of care.  The risks/benefits of therapy have been discussed with the patient/caregiver and he/she is in agreement with the established plan of care.       Therapist:   Jerolyn Shin, PT  09/29/2019, 08:50     Start Time: [id number]  End Time: 0935  Total Treatment Time: 45 minutes  10 min therapeutic exercise (not billed)  35 min PT Eval (1 unit)

## 2019-10-14 ENCOUNTER — Ambulatory Visit (HOSPITAL_BASED_OUTPATIENT_CLINIC_OR_DEPARTMENT_OTHER): Payer: Commercial Managed Care - PPO

## 2019-10-14 ENCOUNTER — Ambulatory Visit
Admission: RE | Admit: 2019-10-14 | Discharge: 2019-10-14 | Disposition: A | Discharge status: Home / Self Care | Payer: Commercial Managed Care - PPO | Source: Ambulatory Visit | Attending: Family | Admitting: Family

## 2019-10-14 ENCOUNTER — Encounter (INDEPENDENT_AMBULATORY_CARE_PROVIDER_SITE_OTHER): Payer: Self-pay | Admitting: Family

## 2019-10-14 ENCOUNTER — Ambulatory Visit (INDEPENDENT_AMBULATORY_CARE_PROVIDER_SITE_OTHER): Payer: Commercial Managed Care - PPO | Admitting: Family

## 2019-10-14 ENCOUNTER — Other Ambulatory Visit: Payer: Self-pay

## 2019-10-14 VITALS — BP 118/74 | HR 63 | Temp 96.0°F | Resp 16 | Ht 66.0 in | Wt 215.0 lb

## 2019-10-14 DIAGNOSIS — S96912S Strain of unspecified muscle and tendon at ankle and foot level, left foot, sequela: Secondary | ICD-10-CM

## 2019-10-14 DIAGNOSIS — M85872 Other specified disorders of bone density and structure, left ankle and foot: Secondary | ICD-10-CM | POA: Insufficient documentation

## 2019-10-14 DIAGNOSIS — S86912A Strain of unspecified muscle(s) and tendon(s) at lower leg level, left leg, initial encounter: Secondary | ICD-10-CM

## 2019-10-14 DIAGNOSIS — M7732 Calcaneal spur, left foot: Secondary | ICD-10-CM | POA: Insufficient documentation

## 2019-10-14 DIAGNOSIS — M25472 Effusion, left ankle: Secondary | ICD-10-CM | POA: Insufficient documentation

## 2019-10-14 NOTE — Patient Instructions (Addendum)
Pt is to rest the left knee and left ankle and keep it elevated as much as possible. He/She is to use ice 20 minutes out of the hour as needed for pain, the first 72 hours, after that may use heat or ice 20 minutes out of the hour as needed for pain or discomfort.  May use an ace bandage as needed for support. If crutches needed, use as directed.  Use RICE. Rest, ice, compression and elevation.  Take prescription as directed if needed, if ordered a steroid, DO NOT take with ibuprofen.    Please get the x-ray as ordered.       Treating Strains and Sprains    Strains and sprains happen when muscles or other soft tissues near your bones stretch or tear. These injuries can cause bruising, swelling, and pain. To ease your discomfort and speed the healing of your strain or sprain, follow the tips below. Remember, a strain or sprain can take 6 to 8 weeks to heal.  Important Note: Do not give aspirin to children or teens without discussing it with your healthcare provider first.  Ice first, heat later   Use ice for the first 24 to 48 hours after injury. Ice helps prevent swelling and reduce pain. Ice the injury for no more than 20 minutes at a time and allow at least20 minutes between icing sessions.   Apply heat after the first72 hours, once the swelling has gone down. Heat relaxes muscles and increases blood flow. Soak the injured area in warm water or use a heating pad set on low for no more than 15 minutes at a time.  Wrap and elevate   Wrap an injured limb firmly with an elastic bandage. This provides support and helps prevent swelling. Don't wear an elastic bandage overnight. Watch for tingling, numbness, or increased pain. Remove the bandage immediately if any of these occurs.   Elevate the injured area to help reduce swelling and throbbing. It's best to raise an injured limb above the level of your heart.  Medicines   Over-the-counter medicines such as acetaminophen or ibuprofen can help reduce pain. Some  also help reduce swelling.   Take medicine only as directed.   Rest the area even if medicines are controlling the pain.  Rest   Rest the injured area by not using it for 24 hours.   When you're ready, return slowly to your normal activities. Rest the injured area often.   Don't use or walk on an injured limb if it hurts.  [employer] last reviewed this educational content on 12/08/2016   2000-2020 [employer]. [address] [place] [address]. All rights reserved. This information is not intended as a substitute for professional medical care. Always follow your healthcare professional's instructions.        Self-Care for Strains and Sprains  Most minor strains and sprains can be treated with self-care. Recovering from a strain or sprain may take 6 to 8 weeks. Your self-care goal is to reduce pain and immobilize the injury to speed healing.   Support the injured area  Wrapping the injured area provides support for short, necessary activities. Be careful not to wrap the area too tightly. This could cut off the blood supply.    Support a wrist, elbow, or shoulder with a sling.   Wrap an ankle or knee with an elastic bandage.   Tape a finger or toe to the one next to it.  Use cold and  heat  Cold reduces swelling. Both cold and heat reduce pain. Heat should not be used in the initial treatment of the injury. When using cold or heat, always place a thin towel between the pack and your skin.    Apply ice or a cold pack 10 to 15 minutes every hour you're awake for the first 2 days.   After the swelling goes down, use cold or heat to control pain. Don't use heat late in the day, since it can cause swelling when you're not active.  Rest and elevate  Rest and elevation help your injury heal faster.   Raise the injured area above your heart level.   Keep the injured area from moving.   Limit the use of the joint or limb.  Use medicine   Aspirin reduces pain and swelling. (Note: Don't give  aspirin to a child 80 or younger unless prescribed by the doctor.)   Non-steroidal anti-inflammatory medicines, such as ibuprofen, may reduce pain and swelling, as well. Ask your healthcare provider for advice.    When to call your healthcare provider  Call your healthcare provider if:   The injured joint won't move, or bones make a grating sound when they move   You can't put weight on the injured area, even after 24 hours   The injured body part is cold, blue, tingling, or numb   The joint or limb appears bent or crooked.   Pain increases or doesn't improve in 4 days   When pressing along the injured area, you notice a spot that is especially painful  StayWell last reviewed this educational content on 04/07/2017   2000-2020 [employer]. [address] [place] [address]. All rights reserved. This information is not intended as a substitute for professional medical care. Always follow your healthcare professional's instructions.        Straight Leg Raise    These instructions are for your right thigh. Switch sides for your left thigh.   1. Sit on the floor with your right leg straight in front of you. Bend your left knee up and put your left foot flat on the floor.  2. Flex your right foot and tighten the thigh muscles of your right leg. Raise your right leg 6 to 8 inches off the floor. Don't arch your back or hunch your shoulders.  3. Hold the right leg in the air for 10 seconds if you can. Then lower the leg slowly and steadily down to the floor. Relax.  4. Repeat 5 times.  5. Do this exercise 3 times a day, or as instructed.    Tip: You can also do this exercise with your toes turned out to strengthen the inner thigh muscles.   StayWell last reviewed this educational content on 01/08/2019   2000-2020 [employer]. [address] [place] [address]. All rights reserved. This information is not intended as a substitute for professional medical care. Always follow  your healthcare professional's instructions.

## 2019-10-14 NOTE — Progress Notes (Signed)
[facility]  [address]  [place] [id number]    Progress Note    Name: Alex Mcmanigal MRN:  [id number]   Date: 10/14/2019 Age: 53 y.o.         Reason for Visit: Knee Pain and Ankle Pain (rolled (L) ankle 6 days ago. Pt was seen at [facility] ER at the beginning of the week. )    Nursing Notes:  There are no exam notes on file for this visit.    History of Present Illness  Dhyana Bastone is a 53 y.o. female who is being seen today for knee and ankle pain. Pt says that she did go the [facility] ER and that the ankle is really sore and that she did just sprained the ankle. Pt says that when she did do this, she immediately gotten sick to the stomach. Pt says that she is having pain as well. Pt says that she did hurt the knee prior to hurting the knee and that she is having pain in the back of the knee and into the knee cap. Pt says that this was feeling better, then when injured the ankle the knee has been hurting worse. Please see the ROS.     Past Medical History:   Diagnosis Date   . Anxiety    . Carotid stenosis    . Chronic back pain    . Coronary artery disease involving native coronary artery of native heart    . DDD (degenerative disc disease), lumbar     "entire spine"   . Ear piercing    . GERD (gastroesophageal reflux disease)    . H/O complete eye exam     2 years, Dr. [name], at [facility]   . History of dental examination     dentures upper and lower   . HTN    . Hypercholesterolemia    . Hyperlipidemia    . MI (myocardial infarction) (CMS [place]) 2012   . Neck problem     herniated cervical disc   . Obesity (BMI 30-39.9)    . 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 [place] ordered.    . Stroke (CMS [facility])     2002   . Tattoo     Left breast, Right shoulder   . Thyroid disorder    . Thyroid nodule    . Uncontrolled type 2 diabetes mellitus, without long-term current use of insulin (CMS [place])    . Xanthelasma          Past Surgical History:      Procedure Laterality Date   . ENDOMETRIAL ABLATION  1998   . HX ANKLE FRACTURE TX  1990s    Right, Doran Durand procedure   . HX CATARACT REMOVAL Bilateral 2013    r and l eyes with implants   . HX CESAREAN SECTION  06/20/2000    x3, 11/21/86, 11/10/84   . HX CHOLECYSTECTOMY  1988   . HX CORONARY ARTERY BYPASS GRAFT  03/31/2011    cardiac, 2 vessel, Dr. [name], [facility]   . HX CORONARY STENT PLACEMENT  2012    x4 stents   . HX GASTRIC SLEEVE  02/2017    [place], [place], Dr. Pearlie Oyster   . HX HEART CATHETERIZATION  2012    massive heart attack and stents - [facility]   . HX THYROID BIOPSY     . HX THYROIDECTOMY  101/01/2018   .  HX TONSILLECTOMY      as child   . HX YAG Left 09/20/2013         Current Outpatient Medications   Medication Sig   . ACCU-CHEK AVIVA PLUS TEST STRP Strip 1 Strip by Subcutaneous route Three times a day as needed   . Blood-Glucose Meter (ACCU-CHEK AVIVA PLUS METER) Misc 1 Kit by Does not apply route Three times a day   . clopidogreL (PLAVIX) 75 mg Oral Tablet Take 1 Tab (75 mg total) by mouth Once a day   . cyanocobalamin (VITAMIN B12) 1,000 mcg/mL Injection Solution 1 mL (1,000 mcg total) by Subcutaneous route Every 30 days   . cyanocobalamin (VITAMIN B12) 1,000 mcg/mL Injection Solution INJECT 1 MILLILITER( 1000 MCG) INTRAMUSCULARLY EVERY MONTH   . diclofenac sodium (VOLTAREN) 1 % Gel 2 g by Apply Topically route Three times a day as needed (Patient not taking: Reported on 10/14/2019)   . docusate sodium (COLACE) 100 mg Oral Capsule Take 1 Cap (100 mg total) by mouth Twice daily   . empagliflozin-linagliptin (GLYXAMBI) 10-5 mg Oral Tablet TAKE 1 TABLET BY MOUTH ONCE DAILY   . ergocalciferol, vitamin D2, (DRISDOL) 1,250 mcg (50,000 unit) Oral Capsule TAKE 1 CAPSULE BY MOUTH EVERY WEEK   . exenatide microspheres ER (BYDUREON) 2 mg Subcutaneous 2 mg by Subcutaneous route Every 7 days   . flash glucose scanning reader (FREESTYLE LIBRE 14 DAY READER) Does not apply Misc 1 Box by Does not apply route  Three times a day   . flash glucose sensor (FREESTYLE LIBRE 14 DAY SENSOR) Does not apply Kit 1 Kit by Does not apply route Three times a day   . furosemide (LASIX) 40 mg Oral Tablet Take 1 Tab (40 mg total) by mouth Once a day   . HYDROcodone-acetaminophen (NORCO) 10-325 mg Oral Tablet Take 1 Tab by mouth Four times a day    . Ibuprofen (MOTRIN) 800 mg Oral Tablet Take 1 Tab (800 mg total) by mouth Three times a day as needed for Pain   . levothyroxine (SYNTHROID) 25 mcg Oral Tablet Take 25 mcg by mouth Once a day   . lisinopriL (PRINIVIL) 2.5 mg Oral Tablet 1 q day   . metFORMIN (GLUCOPHAGE) 500 mg Oral Tablet take 1 tablet by mouth twice a day with food   . methocarbamoL (ROBAXIN) 500 mg Oral Tablet Take 1 Tab (500 mg total) by mouth Three times a day as needed (muscle spasms)   . metoprolol succinate (TOPROL-XL) 25 mg Oral Tablet Sustained Release 24 hr TAKE 1/2 TABLET ONCE DAILY   . NARCAN 4 mg/actuation Nasal Spray, Non-Aerosol PT STATED SHE HAS NARCAN AT HOME BUT IS NOT TAKING IT   . niacin (NIASPAN) 500 mg Oral Tablet Sustained Release Take 1 Tab (500 mg total) by mouth Once a day   . nitroGLYCERIN (NITROSTAT) 0.4 mg Sublingual Tablet, Sublingual 1 Tab (0.4 mg total) by Sublingual route Every 5 minutes as needed for Chest pain for 3 doses over 15 minutes   . omeprazole (PRILOSEC) 40 mg Oral Capsule, Delayed Release(E.C.) Take 1 Cap (40 mg total) by mouth Once a day   . ondansetron (ZOFRAN ODT) 8 mg Oral Tablet, Rapid Dissolve DISSOLVE 1 TABLET(8 MG) ON THE TONGUE EVERY 8 HOURS AS NEEDED FOR NAUSEA OR VOMITING   . phenazopyridine (PYRIDIUM) 200 mg Oral Tablet Take 1 Tab (200 mg total) by mouth Three times a day   . PRENATAL PLUS, CALCIUM CARB, 27 mg iron- 1 mg  Oral Tablet Take 1 Tab by mouth Once a day   . PROVENTIL HFA 90 mcg/actuation Inhalation HFA Aerosol Inhaler Take 1-2 Puffs by inhalation Every 4 hours as needed   . rosuvastatin (CRESTOR) 20 mg Oral Tablet TAKE 1 TABLET BY MOUTH ONCE DAILY   .  venlafaxine (EFFEXOR XR) 37.5 mg Oral Capsule, Sust. Release 24 hr Take 1 Cap (37.5 mg total) by mouth Once a day   . VENTOLIN HFA 90 mcg/actuation Inhalation HFA Aerosol Inhaler Take 1-2 Puffs by inhalation Every 6 hours as needed   . ZYRTEC-D 5-120 mg Oral Tablet Sustained Release 12 hr Take 1 Tab by mouth Once a day     Allergies   Allergen Reactions   . Ane Payment [Pyrilamine-Dextromethorphan]  Other Adverse Reaction (Add comment)     LIGHT HEADED   . Tylenol W Codeine [Acetaminophen-Codeine]  Other Adverse Reaction (Add comment)     pt states that one time she had chest pains with this med, but dr. thought it was because she had not eaten     Family Medical History:     Problem Relation (Age of Onset)    Atrial fibrillation Sister    Colon Cancer Father    Diabetes Mother, Maternal Aunt    Heart Disease Sister, Maternal Grandfather    Hypertension (High Blood Pressure) Mother    Lung Cancer Maternal Grandfather    MI <77 years of age Mother            Social History     Tobacco Use   . Smoking status: Current Every Day Smoker     Packs/day: 0.50     Years: 20.00     Pack years: 10.00     Types: Cigarettes     Start date: 65   . Smokeless tobacco: Never Used   Substance Use Topics   . Alcohol use: No     Frequency: Never     Binge frequency: Never   . Drug use: No       Review of Systems  Review of Systems   Constitutional: Negative for chills, diaphoresis, fever and malaise/fatigue.   HENT: Negative for congestion, ear discharge, ear pain, sinus pain and sore throat.    Respiratory: Negative for cough, sputum production, shortness of breath and wheezing.    Cardiovascular: Negative for chest pain.   Gastrointestinal: Negative for diarrhea, nausea and vomiting.   Musculoskeletal: Positive for joint pain (knee and ankle pain).       Physical Exam:  BP 118/74 (Site: Right, Patient Position: Sitting, Cuff Size: Adult)   Pulse 63   Temp 35.6 C (96 F)   Resp 16   Ht 1.676 m (_0 )   Wt 97.5 kg (215 lb)    LMP  (LMP Unknown)   SpO2 97%   BMI 34.70 kg/m       Physical Exam  Vitals signs reviewed.   Constitutional:       General: She is not in acute distress.     Appearance: Normal appearance. She is obese. She is not ill-appearing.   Cardiovascular:      Rate and Rhythm: Normal rate and regular rhythm.      Heart sounds: Normal heart sounds. No murmur.   Pulmonary:      Effort: Pulmonary effort is normal. No respiratory distress.      Breath sounds: Normal breath sounds. No wheezing, rhonchi or rales.   Musculoskeletal:  General: Swelling, tenderness and signs of injury present.      Comments: Pt has decreased ROM of the left ankle, she has pain to the left ankle on palpation. Pt does have an air cast on to the left ankle with a ace wrap. Pt declines the use of crutches or wheelchair.    Skin:     General: Skin is warm and dry.   Neurological:      Mental Status: She is alert and oriented to person, place, and time.           Assessment:      ICD-10-CM    1. Knee strain, left, initial encounter  S86.912A XR ANKLE LEFT     RETURN TO WORK/SCHOOL   2. Left ankle strain, sequela  S96.912S XR ANKLE LEFT     RETURN TO WORK/SCHOOL             Plan:    Orders Placed This Encounter   . XR ANKLE LEFT      Pt is to rest the left knee and left ankle and keep it elevated as much as possible. He/She is to use ice 20 minutes out of the hour as needed for pain, the first 72 hours, after that may use heat or ice 20 minutes out of the hour as needed for pain or discomfort.  May use an ace bandage as needed for support. If crutches needed, use as directed.  Use RICE. Rest, ice, compression and elevation.  Take prescription as directed if needed, if ordered a steroid, DO NOT take with ibuprofen.    Please get the x-ray as ordered.       Seek medical attention for new or worsening symptoms.            Arlester Marker, APRN  10/14/2019, 14:52

## 2019-10-17 ENCOUNTER — Other Ambulatory Visit (INDEPENDENT_AMBULATORY_CARE_PROVIDER_SITE_OTHER): Payer: Self-pay | Admitting: Family

## 2019-10-17 DIAGNOSIS — S96912S Strain of unspecified muscle and tendon at ankle and foot level, left foot, sequela: Secondary | ICD-10-CM

## 2019-10-19 ENCOUNTER — Other Ambulatory Visit (HOSPITAL_BASED_OUTPATIENT_CLINIC_OR_DEPARTMENT_OTHER): Payer: Self-pay | Admitting: Family Medicine

## 2019-10-19 ENCOUNTER — Other Ambulatory Visit (HOSPITAL_BASED_OUTPATIENT_CLINIC_OR_DEPARTMENT_OTHER): Payer: Self-pay | Admitting: CARDIOVASCULAR DISEASE

## 2019-10-19 NOTE — Telephone Encounter (Signed)
Regarding: Dr. Russ Halo  ----- Message from Myna Hidalgo sent at 10/19/2019  3:56 PM EST -----  Current Outpatient Medications:    #ZYRTEC-D 5-120 mg Oral Tablet Sustained Release 12 hr, Take 1 Tab by mouth Once a day      Preferred [facility], [place] - [address] AT [place]    [address] [place] [address]    Phone: [phone] Fax: [phone]    Not a 24 hour pharmacy; exact hours not known.

## 2019-10-24 ENCOUNTER — Encounter (INDEPENDENT_AMBULATORY_CARE_PROVIDER_SITE_OTHER): Payer: Self-pay | Admitting: Physical Medicine & Rehabilitation

## 2019-10-24 ENCOUNTER — Encounter (INDEPENDENT_AMBULATORY_CARE_PROVIDER_SITE_OTHER): Payer: Self-pay | Admitting: Physician Assistant

## 2019-10-24 ENCOUNTER — Ambulatory Visit (INDEPENDENT_AMBULATORY_CARE_PROVIDER_SITE_OTHER): Payer: Commercial Managed Care - PPO | Admitting: Physical Medicine & Rehabilitation

## 2019-10-24 ENCOUNTER — Telehealth (INDEPENDENT_AMBULATORY_CARE_PROVIDER_SITE_OTHER): Payer: Self-pay | Admitting: Physical Medicine & Rehabilitation

## 2019-10-24 ENCOUNTER — Other Ambulatory Visit: Payer: Self-pay

## 2019-10-24 ENCOUNTER — Ambulatory Visit (INDEPENDENT_AMBULATORY_CARE_PROVIDER_SITE_OTHER): Payer: Commercial Managed Care - PPO | Admitting: PHYSICIAN ASSISTANT

## 2019-10-24 ENCOUNTER — Encounter (INDEPENDENT_AMBULATORY_CARE_PROVIDER_SITE_OTHER): Payer: Self-pay | Admitting: PHYSICIAN ASSISTANT

## 2019-10-24 VITALS — BP 126/82 | HR 62 | Ht 66.0 in | Wt 223.0 lb

## 2019-10-24 VITALS — BP 105/68 | HR 67 | Ht 66.0 in | Wt 223.0 lb

## 2019-10-24 DIAGNOSIS — M5442 Lumbago with sciatica, left side: Secondary | ICD-10-CM

## 2019-10-24 DIAGNOSIS — M7062 Trochanteric bursitis, left hip: Secondary | ICD-10-CM

## 2019-10-24 DIAGNOSIS — Z79899 Other long term (current) drug therapy: Secondary | ICD-10-CM

## 2019-10-24 DIAGNOSIS — M47816 Spondylosis without myelopathy or radiculopathy, lumbar region: Secondary | ICD-10-CM

## 2019-10-24 DIAGNOSIS — S93402A Sprain of unspecified ligament of left ankle, initial encounter: Secondary | ICD-10-CM

## 2019-10-24 DIAGNOSIS — G8929 Other chronic pain: Secondary | ICD-10-CM

## 2019-10-24 MED ORDER — TRAMADOL 50 MG TABLET
1.00 | ORAL_TABLET | Freq: Four times a day (QID) | ORAL | 0 refills | Status: DC | PRN
Start: 2019-10-24 — End: 2020-01-24

## 2019-10-24 NOTE — H&P (Signed)
PAIN MANAGEMENT, [facility]  [address]  [place] [address]  Dept: [phone]  Dept Fax: [phone]    NEW PATIENT      Patient Name:  Monique [name]   Referring Provider: Chryl Heck, PA-C    MRN:  [id number]   DOB AND AGE [birth year] 53 y.o.    Date of Service:  10/24/2019     CHIEF COMPLAINT: Low Back Pain (Pain rated 10 with out medications.) and Foot Pain    HPI: Monique [name] is a 53 y.o. female referred by Dr. Koleen Nimrod but for evaluation regarding pain medication management.  She was a previous patient of Dr. Hortencia Pilar.  She reports chronic lower thoracic and lumbar pain for the past 8-10 years that she relates to arthritis.    The back pain radiates into the left hip.  It  is described as varying from a constant dull ache to sharp shooting.  She wants to rate the pain as 9-10 but does not appear that uncomfortable.  It is worse with prolonged standing and walking.   It is better with rest and lying down.  Topical gels help somewhat.          PAIN FUNCTION:    [place] PAIN RATING SCALE     On a scale of 0-10, during the past 24 hours, pain has interfered with you usual activity:    8  On a scale of 0-10, during the past 24 hours, pain has interfered with your sleep:   3  On a scale of 0-10, during the past 24 hours, pain has affected your mood:   6  On a scale of 0-10, during the past 24 hours, pain has contributed to your stress:   6  On a scale of 0-10, what is your overall pain Rating:     9    The pain interferes with: Not at all A little bit Moderately Quite a bit Extremely   Going to work    x    Performing household chores   x     [employer] work or shopping   x     Socializing with friends        Recreation and hobbies                Physical exercise    x     Sleep  x      Appetite          TREATMENT HISTORY:    The patient has previously under gone interventions including:   Helpful Not helpful Have not had   SURGERY:      Nerve block: Epidural RFL OTHERS:   x                Trigger point injections   x                         Spinal cord stimulator                            TENS, electrical stimulation                            Biofeedback / Relaxation Therapy   x                         Acupuncture  x                      Psychology for Pain Management         x                          Oral Steroids                             Chiropractor or manipulation     x                          Physical Therapy          2020  x                           Massage Therapy  x                                                          PRIOR PAIN MEDICATIONS TAKEN INCLUDE:   Tylenol -allergy - chest pain  Tylenol codeine - chest pain    CURRENT PAIN REGIMEN:   Robaxin 1-2  Per day with some mild relief  motrin    PAST MEDICAL HISTORY      Past Medical History:   Diagnosis Date   . Anxiety    . Carotid stenosis    . Chronic back pain    . Coronary artery disease involving native coronary artery of native heart    . DDD (degenerative disc disease), lumbar     "entire spine"   . Ear piercing    . GERD (gastroesophageal reflux disease)    . H/O complete eye exam     2 years, Dr. [name], at [facility]   . History of dental examination     dentures upper and lower   . HTN    . Hypercholesterolemia    . Hyperlipidemia    . MI (myocardial infarction) (CMS [place]) 2012   . Neck problem     herniated cervical disc   . Obesity (BMI 30-39.9)    . 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 [place] ordered.    . Stroke (CMS [facility])     2002   . Tattoo     Left breast, Right shoulder   . Thyroid disorder    . Thyroid nodule    . Uncontrolled type 2 diabetes mellitus, without long-term current use of insulin (CMS [place])    . Xanthelasma          PAST SURGICAL HISTORY:  Past Surgical History:   Procedure Laterality Date   . ENDOMETRIAL ABLATION  1998   . HX ANKLE FRACTURE TX  1990s    Right, Doran Durand procedure   . HX CATARACT REMOVAL Bilateral 2013    r and l  eyes with implants   . HX CESAREAN SECTION  06/20/2000    x3, 11/21/86, 11/10/84   . HX CHOLECYSTECTOMY  1988   . HX CORONARY ARTERY BYPASS GRAFT  03/31/2011    cardiac, 2 vessel, Dr. [name], [facility]   . HX  CORONARY STENT PLACEMENT  2012    x4 stents   . HX GASTRIC SLEEVE  02/2017    [place], [place], Dr. Pearlie Oyster   . HX HEART CATHETERIZATION  2012    massive heart attack and stents - [facility]   . HX THYROID BIOPSY     . HX THYROIDECTOMY  101/01/2018   . HX TONSILLECTOMY      as child   . HX YAG Left 09/20/2013         FAMILY HEALTH HISTORY:  Family Medical History:     Problem Relation (Age of Onset)    Atrial fibrillation Sister    Colon Cancer Father    Diabetes Mother, Maternal Aunt    Heart Disease Sister, Maternal Grandfather    Hypertension (High Blood Pressure) Mother    Lung Cancer Maternal Grandfather    MI <32 years of age Mother            PAST SOCIAL HISTORY:  Social History     Socioeconomic History   . Marital status: Married     Spouse name: [name]   . Number of children: 3   . Years of education: completed 11th grade   . Highest education level: Not on file   Occupational History   . Occupation: disabled   Social Needs   . Financial resource strain: Not hard at all   . Food insecurity     Worry: Never true     Inability: Never true   . Transportation needs     Medical: No     Non-medical: No   Tobacco Use   . Smoking status: Current Every Day Smoker     Packs/day: 0.50     Years: 20.00     Pack years: 10.00     Types: Cigarettes     Start date: 6   . Smokeless tobacco: Never Used   Substance and Sexual Activity   . Alcohol use: No     Frequency: Never     Binge frequency: Never   . Drug use: No   . Sexual activity: Yes     Partners: Male   Lifestyle   . Physical activity     Days per week: 0 days     Minutes per session: 0 min   . Stress: To some extent   Relationships   . Social connections     Talks on phone: More than three times a week     Gets together: More than three times a week     Attends  religious service: Never     Active member of club or organization: No     Attends meetings of clubs or organizations: Never     Relationship status: Married   . Intimate partner violence     Fear of current or ex partner: No     Emotionally abused: No     Physically abused: No     Forced sexual activity: No   Other Topics Concern   . Abuse/Domestic Violence Not Asked   . Breast Self Exam Not Asked   . Caffeine Concern Not Asked   . Calcium intake adequate Not Asked   . Computer Use Not Asked   . Drives Not Asked   . Exercise Concern Not Asked   . Helmet Use Not Asked   . Seat Belt Not Asked   . Special Diet Not Asked   . Sunscreen used Not Asked   . Uses Cane Not Asked   .  Uses walker Not Asked   . Uses wheelchair Not Asked   . Right hand dominant Not Asked   . Left hand dominant Not Asked   . Ambidextrous Not Asked   . Shift Work Not Asked   . Unusual Sleep-Wake Schedule Not Asked   . Ability to Walk 1 Flight of Steps without SOB/CP Not Asked   . Routine Exercise No   . Ability to Walk 2 Flight of Steps without SOB/CP Yes   . Unable to Ambulate Not Asked   . Total Care Not Asked   . Ability To Do Own ADL's Not Asked   . Uses Walker Not Asked   . Other Activity Level Not Asked   . Uses Cane Not Asked   Social History Narrative    Living situation: lives at home with husband, [name], and daughter.  1 dog named Duke and 1 cat named Lucky.    Nutrition:  Eats 5 small meals d/t gastric sleeve surgery.     Caffeine use: none, decaf coffee    Exercise: stays busy around house. No formal form of exercise    Seatbelt use: sometimes    Fire extinguishers in the home: yes    Smoke alarms in the home: yes    Carbon monoxide detectors in the home: yes    Nancy Fetter exposure: sunglasses        Farida would like for husband, Yailyn Strack,  to speak for her in the event she would become incapacitated.    Full code.      MEDICATIONS:    .  ACCU-CHEK AVIVA PLUS TEST STRP Strip, 1 Strip by Subcutaneous route Three times a day as needed    .   Blood-Glucose Meter (ACCU-CHEK AVIVA PLUS METER) Misc, 1 Kit by Does not apply route Three times a day    .  clopidogreL (PLAVIX) 75 mg Oral Tablet, Take 1 Tab (75 mg total) by mouth Once a day    .  cyanocobalamin (VITAMIN B12) 1,000 mcg/mL Injection Solution, 1 mL (1,000 mcg total) by Subcutaneous route Every 30 days    .  cyanocobalamin (VITAMIN B12) 1,000 mcg/mL Injection Solution, INJECT 1 MILLILITER( 1000 MCG) INTRAMUSCULARLY EVERY MONTH    .  diclofenac sodium (VOLTAREN) 1 % Gel, 2 g by Apply Topically route Three times a day as needed    .  docusate sodium (COLACE) 100 mg Oral Capsule, Take 1 Cap (100 mg total) by mouth Twice daily    .  empagliflozin-linagliptin (GLYXAMBI) 10-5 mg Oral Tablet, TAKE 1 TABLET BY MOUTH ONCE DAILY    .  ergocalciferol, vitamin D2, (DRISDOL) 1,250 mcg (50,000 unit) Oral Capsule, TAKE 1 CAPSULE BY MOUTH EVERY WEEK    .  exenatide microspheres ER (BYDUREON) 2 mg Subcutaneous, 2 mg by Subcutaneous route Every 7 days    .  flash glucose scanning reader (FREESTYLE LIBRE 14 DAY READER) Does not apply Misc, 1 Box by Does not apply route Three times a day    .  flash glucose sensor (FREESTYLE LIBRE 14 DAY SENSOR) Does not apply Kit, 1 Kit by Does not apply route Three times a day    .  furosemide (LASIX) 40 mg Oral Tablet, Take 1 Tab (40 mg total) by mouth Once a day    .  HYDROcodone-acetaminophen (NORCO) 10-325 mg Oral Tablet, Take 1 Tab by mouth Four times a day     .  Ibuprofen (MOTRIN) 800 mg Oral Tablet, Take 1 Tab (800 mg  total) by mouth Three times a day as needed for Pain    .  levothyroxine (SYNTHROID) 25 mcg Oral Tablet, Take 25 mcg by mouth Once a day    .  lisinopriL (PRINIVIL) 2.5 mg Oral Tablet, 1 q day    .  metFORMIN (GLUCOPHAGE) 500 mg Oral Tablet, take 1 tablet by mouth twice a day with food    .  methocarbamoL (ROBAXIN) 500 mg Oral Tablet, Take 1 Tab (500 mg total) by mouth Three times a day as needed (muscle spasms)    .  metoprolol succinate (TOPROL-XL) 25 mg Oral  Tablet Sustained Release 24 hr, TAKE 1/2 TABLET ONCE DAILY    .  NARCAN 4 mg/actuation Nasal Spray, Non-Aerosol, PT STATED SHE HAS NARCAN AT HOME BUT IS NOT TAKING IT    .  niacin (NIASPAN) 500 mg Oral Tablet Sustained Release, Take 1 Tab (500 mg total) by mouth Once a day    .  nitroGLYCERIN (NITROSTAT) 0.4 mg Sublingual Tablet, Sublingual, 1 Tab (0.4 mg total) by Sublingual route Every 5 minutes as needed for Chest pain for 3 doses over 15 minutes    .  omeprazole (PRILOSEC) 40 mg Oral Capsule, Delayed Release(E.C.), Take 1 Cap (40 mg total) by mouth Once a day    .  ondansetron (ZOFRAN ODT) 8 mg Oral Tablet, Rapid Dissolve, DISSOLVE 1 TABLET(8 MG) ON THE TONGUE EVERY 8 HOURS AS NEEDED FOR NAUSEA OR VOMITING    .  phenazopyridine (PYRIDIUM) 200 mg Oral Tablet, Take 1 Tab (200 mg total) by mouth Three times a day    .  PRENATAL PLUS, CALCIUM CARB, 27 mg iron- 1 mg Oral Tablet, Take 1 Tab by mouth Once a day    .  PROVENTIL HFA 90 mcg/actuation Inhalation HFA Aerosol Inhaler, Take 1-2 Puffs by inhalation Every 4 hours as needed    .  rosuvastatin (CRESTOR) 20 mg Oral Tablet, TAKE 1 TABLET BY MOUTH ONCE DAILY    .  venlafaxine (EFFEXOR XR) 37.5 mg Oral Capsule, Sust. Release 24 hr, Take 1 Cap (37.5 mg total) by mouth Once a day    .  VENTOLIN HFA 90 mcg/actuation Inhalation HFA Aerosol Inhaler, Take 1-2 Puffs by inhalation Every 6 hours as needed    .  ZYRTEC-D 5-120 mg Oral Tablet Sustained Release 12 hr, Take 1 Tab by mouth Once a day    No facility-administered medications prior to visit.      ALLERGIES:  Allergies   Allergen Reactions   . Ane Payment [Pyrilamine-Dextromethorphan]  Other Adverse Reaction (Add comment)     LIGHT HEADED   . Tylenol W Codeine [Acetaminophen-Codeine]  Other Adverse Reaction (Add comment)     pt states that one time she had chest pains with this med, but dr. thought it was because she had not eaten       REVIEW OF SYSTEMS:  Nursing Notes:   Frederick Peers, LPN  25/95/63 [id number]  Signed  Review  of Systems:  Constitutional: negative except for none  Eyes: negative except for contacts/glasses  Ears, nose, mouth, throat, and face: negative except for none  Respiratory: negative except for pneumonia  Cardiovascular: negative except for none  Gastrointestinal: negative except for reflux symptoms  Genitourinary:negative except for none  Integument/breast: negative except for none  Hematologic/lymphatic: negative except for easy bruising and bleeding  Musculoskeletal:negative except for stiff joints, neck pain, back pain and bone pain  Neurological: negative except for none  Behavioral/Psych: negative except for  none  Endocrine: negative except for diabetes and thyroid disease          VITALS:  Vitals:    10/24/19 1049   BP: 126/82   Pulse: 62   SpO2: 96%   Weight: 101 kg (223 lb)   Height: 1.676 m (5' 6" )   BMI: 36.07        PHYSICAL EXAMINATION:    Physical Exam  Vitals signs and nursing note reviewed.   Constitutional:       Appearance: Normal appearance.   HENT:      Head: Normocephalic and atraumatic.      Right Ear: External ear normal.      Nose: Nose normal. No rhinorrhea.      Mouth/Throat:      Mouth: Mucous membranes are moist.   Eyes:      General: No scleral icterus.     Extraocular Movements: Extraocular movements intact.   Neck:      Musculoskeletal: Neck supple.   Cardiovascular:      Rate and Rhythm: Normal rate and regular rhythm.   Pulmonary:      Effort: Pulmonary effort is normal.   Chest:      Chest wall: No tenderness.   Abdominal:      General: There is no distension.      Palpations: Abdomen is soft.      Tenderness: There is no abdominal tenderness.   Musculoskeletal:      Comments: No significant tenderness cervical paraspinals  C spine and upper range of motion is normal  Upper strength is 5/5  She reports tenderness to palpation in mid and lower thoracic paraspinals  Moderate to severe discomfort is reported in lower lumbar region throughout -even to light touch  SLR negative  bilateral  Tenderness noted over trochanteric bursa on the left  She reports pain with range of motion left hip -other lower range is normal  Lower strength is 5/5     Skin:     General: Skin is warm and dry.   Neurological:      Mental Status: She is alert.      Comments: Patient is alert and oriented  Speech is clear  Upper sensation is reported as normal to light touch  Upper reflexes 2/4 biceps, triceps and brachioradialis  Lower sensation reported normal to light touch.  Lower quadriceps reflexes 2/4 and achilles absent bilateral  Gait is normal without assistive devices   Psychiatric:         Mood and Affect: Mood normal.         Behavior: Behavior normal.          DATA REVIEWED:   NARx  OARRS from 2015-previous patient [facility] as well as Dr Hortencia Pilar  ORT Score is self reported as zero    X PHARMACY REVIEWXRAY LUMBAR 08/31/2019    IMPRESSION:  1. Osteopenia but no definite acute fracture or destructive bony process.  2. Moderate facet arthritis in the lower lumbar spine which is grossly  stable.  3. Mild degenerative disc disease most pronounced near the thoracolumbar  junction. The lumbar disks are relatively well-preserved.  4. Extensive vascular calcification is noted in the aorta and iliac  Vessels.      XRAY LEFT HIP AND PELVIS 08/31/2019  IMPRESSION:  1. The bone mineralization is slightly diminished but there is no acute  fracture or dislocation.  2. Mild degenerative changes involving the hips with slight sclerosis along  the superior acetabular  margins.  3. Heavy vascular calcification is noted.      XRAY LEFT KNEE 08/31/2019  IMPRESSION:  1. Mild degenerative changes present about the left knee with no acute bony  abnormality.    ASSESSMENT AND PLAN:    ICD-10-CM    1. Encounter for medication management  Z79.899    2. Chronic bilateral low back pain with left-sided sciatica  M54.42 Refer to [facility] [place]    G89.29    3. Facet arthritis of lumbar region  M47.816    4.  Trochanteric bursitis of left hip  M70.62      Initial visit for patient who was previously followed by Dr Mendel Corning and then Dr Thayer Dallas  She is requesting pain medication for management of lower back pain.  She reports that she cannot have injections because of blood sugars and I explained that not all injections involve steroids.      Plan:  Referral to Perlie Mayo for evaluation prior to considering opioid treatment.  Treatment with any type of medication will be monitored due to patient's chronic kidney disease.  Last GFR in October of 2019 recorded is 9 with creatinine of 1.2.  Last A1c was 9.7.     Will obtain copies of MRIs from [place]  Neurontin may be considered next visit And will discuss referral for injections    Follow up to be scheduled after evaluation with Shon Baton, DO

## 2019-10-24 NOTE — Progress Notes (Signed)
Monique Must [facility] [address]  [place] [id number]    Progress Note    Name: Monique [name] MRN:  [id number]   Date: 10/24/2019 Age: 53 y.o.         Chief Complaint: Hip Pain (RPV f/u lt hip trochanteric bursitis. Pt also has injury to lt ankle on 10/14/2019. Tried ice, heat, NSAIDS for ankle with no relief.)      HPI: Monique [name] is a 53 y.o. female presenting for left ankle injury.  Initial date of injury was 10/09/2019 when patient fell and twisted her ankle.  She has had increased pain since.  She has swelling.  Her pain is over the medial malleolus.  She has difficulty ambulating.     Medical History     Past Medical History  Current Outpatient Medications   Medication Sig   . ACCU-CHEK AVIVA PLUS TEST STRP Strip 1 Strip by Subcutaneous route Three times a day as needed   . Blood-Glucose Meter (ACCU-CHEK AVIVA PLUS METER) Misc 1 Kit by Does not apply route Three times a day   . clopidogreL (PLAVIX) 75 mg Oral Tablet Take 1 Tab (75 mg total) by mouth Once a day   . cyanocobalamin (VITAMIN B12) 1,000 mcg/mL Injection Solution 1 mL (1,000 mcg total) by Subcutaneous route Every 30 days   . cyanocobalamin (VITAMIN B12) 1,000 mcg/mL Injection Solution INJECT 1 MILLILITER( 1000 MCG) INTRAMUSCULARLY EVERY MONTH   . diclofenac sodium (VOLTAREN) 1 % Gel 2 g by Apply Topically route Three times a day as needed   . docusate sodium (COLACE) 100 mg Oral Capsule Take 1 Cap (100 mg total) by mouth Twice daily   . empagliflozin-linagliptin (GLYXAMBI) 10-5 mg Oral Tablet TAKE 1 TABLET BY MOUTH ONCE DAILY   . ergocalciferol, vitamin D2, (DRISDOL) 1,250 mcg (50,000 unit) Oral Capsule TAKE 1 CAPSULE BY MOUTH EVERY WEEK   . exenatide microspheres ER (BYDUREON) 2 mg Subcutaneous 2 mg by Subcutaneous route Every 7 days   . flash glucose scanning reader (FREESTYLE LIBRE 14 DAY READER) Does not apply Misc 1 Box by Does not apply route Three times a day   . flash glucose sensor (FREESTYLE  LIBRE 14 DAY SENSOR) Does not apply Kit 1 Kit by Does not apply route Three times a day   . furosemide (LASIX) 40 mg Oral Tablet Take 1 Tab (40 mg total) by mouth Once a day   . HYDROcodone-acetaminophen (NORCO) 10-325 mg Oral Tablet Take 1 Tab by mouth Four times a day    . Ibuprofen (MOTRIN) 800 mg Oral Tablet Take 1 Tab (800 mg total) by mouth Three times a day as needed for Pain   . levothyroxine (SYNTHROID) 25 mcg Oral Tablet Take 25 mcg by mouth Once a day   . lisinopriL (PRINIVIL) 2.5 mg Oral Tablet 1 q day   . metFORMIN (GLUCOPHAGE) 500 mg Oral Tablet take 1 tablet by mouth twice a day with food   . methocarbamoL (ROBAXIN) 500 mg Oral Tablet Take 1 Tab (500 mg total) by mouth Three times a day as needed (muscle spasms)   . metoprolol succinate (TOPROL-XL) 25 mg Oral Tablet Sustained Release 24 hr TAKE 1/2 TABLET ONCE DAILY   . NARCAN 4 mg/actuation Nasal Spray, Non-Aerosol PT STATED SHE HAS NARCAN AT HOME BUT IS NOT TAKING IT   . niacin (NIASPAN) 500 mg Oral Tablet Sustained Release Take 1 Tab (500 mg total) by mouth Once a day   . nitroGLYCERIN (  NITROSTAT) 0.4 mg Sublingual Tablet, Sublingual 1 Tab (0.4 mg total) by Sublingual route Every 5 minutes as needed for Chest pain for 3 doses over 15 minutes   . omeprazole (PRILOSEC) 40 mg Oral Capsule, Delayed Release(E.C.) Take 1 Cap (40 mg total) by mouth Once a day   . ondansetron (ZOFRAN ODT) 8 mg Oral Tablet, Rapid Dissolve DISSOLVE 1 TABLET(8 MG) ON THE TONGUE EVERY 8 HOURS AS NEEDED FOR NAUSEA OR VOMITING   . phenazopyridine (PYRIDIUM) 200 mg Oral Tablet Take 1 Tab (200 mg total) by mouth Three times a day   . PRENATAL PLUS, CALCIUM CARB, 27 mg iron- 1 mg Oral Tablet Take 1 Tab by mouth Once a day   . PROVENTIL HFA 90 mcg/actuation Inhalation HFA Aerosol Inhaler Take 1-2 Puffs by inhalation Every 4 hours as needed   . rosuvastatin (CRESTOR) 20 mg Oral Tablet TAKE 1 TABLET BY MOUTH ONCE DAILY   . venlafaxine (EFFEXOR XR) 37.5 mg Oral Capsule, Sust. Release  24 hr Take 1 Cap (37.5 mg total) by mouth Once a day   . VENTOLIN HFA 90 mcg/actuation Inhalation HFA Aerosol Inhaler Take 1-2 Puffs by inhalation Every 6 hours as needed   . ZYRTEC-D 5-120 mg Oral Tablet Sustained Release 12 hr Take 1 Tab by mouth Once a day     Allergies   Allergen Reactions   . Ane Payment [Pyrilamine-Dextromethorphan]  Other Adverse Reaction (Add comment)     LIGHT HEADED   . Tylenol W Codeine [Acetaminophen-Codeine]  Other Adverse Reaction (Add comment)     pt states that one time she had chest pains with this med, but dr. thought it was because she had not eaten     Past Medical History:   Diagnosis Date   . Anxiety    . Carotid stenosis    . Chronic back pain    . Coronary artery disease involving native coronary artery of native heart    . DDD (degenerative disc disease), lumbar     "entire spine"   . Ear piercing    . GERD (gastroesophageal reflux disease)    . H/O complete eye exam     2 years, Dr. [name], at [facility]   . History of dental examination     dentures upper and lower   . HTN    . Hypercholesterolemia    . Hyperlipidemia    . MI (myocardial infarction) (CMS [place]) 2012   . Neck problem     herniated cervical disc   . Obesity (BMI 30-39.9)    . 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 [place] ordered.    . Stroke (CMS [facility])     2002   . Tattoo     Left breast, Right shoulder   . Thyroid disorder    . Thyroid nodule    . Uncontrolled type 2 diabetes mellitus, without long-term current use of insulin (CMS [place])    . Xanthelasma          Past Surgical History:   Procedure Laterality Date   . AUTOLOGOUS BLOOD CONSERVATION SETUP N/A 03/31/2011    Performed by Sydnee Levans, MD at [place]   . AUTOLOGOUS BLOOD CONSERVATION, MONITORING PER HOUR N/A 03/31/2011    Performed by Sydnee Levans, MD at [place]   . BYPASS GRAFT CORONARY ARTERY N/A 03/31/2011    Performed by Sydnee Levans, MD at  [place] [facility] [place]   . HARVEST VEIN SAPHENOUS ENDOSCOPIC Right 03/31/2011    Performed by Sydnee Levans, MD at [place]   . HX ANKLE FRACTURE [place]  1990s    Right, Doran Durand procedure   . HX CATARACT REMOVAL Bilateral 2013    r and l eyes with implants   . HX CESAREAN SECTION  06/20/2000    x3, 11/21/86, 11/10/84   . HX CHOLECYSTECTOMY  1988   . HX CORONARY ARTERY BYPASS GRAFT  03/31/2011    cardiac, 2 vessel, Dr. [name], [facility]   . HX CORONARY STENT PLACEMENT  2012    x4 stents   . HX GASTRIC SLEEVE  02/2017    [place], [place], Dr. Pearlie Oyster   . HX HEART CATHETERIZATION  2012    massive heart attack and stents - [facility]   . HX THYROID BIOPSY     . HX THYROIDECTOMY  101/01/2018   . HX TONSILLECTOMY      as child   . HX YAG Left 09/20/2013   . PERFUSION CHARGE/STBY/CARDIOPULMONARY BYPASS N/A 03/31/2011    Performed by Sydnee Levans, MD at [place]   . Right Thyroid Lobectomy N/A 09/07/2018    Performed by Drue Novel, MD at [place] History:     Problem Relation (Age of Onset)    Atrial fibrillation Sister    Colon Cancer Father    Diabetes Mother, Maternal Aunt    Heart Disease Sister, Maternal Grandfather    Hypertension (High Blood Pressure) Mother    Lung Cancer Maternal Grandfather    MI <13 years of age Mother            Social History     Socioeconomic History   . Marital status: Married     Spouse name: [name]   . Number of children: 3   . Years of education: completed 11th grade   . Highest education level: Not on file   Occupational History   . Occupation: disabled   Social Needs   . Financial resource strain: Not hard at all   . Food insecurity     Worry: Never true     Inability: Never true   . Transportation needs     Medical: No     Non-medical: No   Tobacco Use   . Smoking status: Current Every Day Smoker     Packs/day: 0.50     Years: 20.00     Pack years: 10.00     Types: Cigarettes     Start date: 6   . Smokeless tobacco: Never Used      Substance and Sexual Activity   . Alcohol use: No     Frequency: Never     Binge frequency: Never   . Drug use: No   . Sexual activity: Yes     Partners: Male   Lifestyle   . Physical activity     Days per week: 0 days     Minutes per session: 0 min   . Stress: To some extent   Relationships   . Social connections     Talks on phone: More than three times a week     Gets together: More than three times a week     Attends religious service: Never     Active member of club or organization: No  Attends meetings of clubs or organizations: Never     Relationship status: Married   . Intimate partner violence     Fear of current or ex partner: No     Emotionally abused: No     Physically abused: No     Forced sexual activity: No   Other Topics Concern   . Routine Exercise No   . Ability to Walk 2 Flight of Steps without SOB/CP Yes   Social History Narrative    Living situation: lives at home with husband, [name], and daughter.  1 dog named Duke and 1 cat named Lucky.    Nutrition:  Eats 5 small meals d/t gastric sleeve surgery.     Caffeine use: none, decaf coffee    Exercise: stays busy around house. No formal form of exercise    Seatbelt use: sometimes    Fire extinguishers in the home: yes    Smoke alarms in the home: yes    Carbon monoxide detectors in the home: yes    Nancy Fetter exposure: sunglasses        Sh[place]ka would like for husband, Lace Chenevert,  to speak for her in the event she would become incapacitated.    Full code.        Objective   Vitals:  BP 105/68   Pulse 67   Ht 1.676 m ('5\' 6"'$ )   Wt 101 kg (223 lb)   LMP  (LMP Unknown)   BMI 35.99 kg/m       Body mass index is 35.99 kg/m.    Review of Systems:   Constitutional: no fever, chills, or unexpected weight loss or weight gain   HEENT: no vision problem, earaches or sore throat  Respiratory: no cough or wheeze  Cardiovascular: no chest pain, palpitations or edema  Gastrointestinal: no abdominal pain, nausea, vomiting or diarrhea   Neurological: no  headaches or weakness  Psych: no anxiety or depression  All other ROS Negative    Physical Exam:  Constitutional: Patient is oriented to person, place, and time and well-developed, well-nourished, and in no distress    Musculoskeletal:  examination left ankle:  Skin intact.  1+ swelling.  Significant tenderness palpation over the lateral malleolus.  Decreased and painful dorsiflexion and plantar flexion of the foot.    Laboratory Studies/Data Reviewed   I have reviewed all available imagining and laboratory studies as appropriate.  No visits with results within 1 Month(s) from this visit.   Latest known visit with results is:   Office Visit on 09/18/2019   Component Date Value Ref Range Status   . URINE CULTURE 09/18/2019 >100000 Klebsiella pneumoniae*  Final       Assessment/Plan   Diagnosis:    ICD-10-CM    1. Left ankle sprain  S93.402A      Encounter Medications and Orders  No orders of the defined types were placed in this encounter.      Plan:  Will treat patient with a Cam boot walker for left ankle sprain possible distal fibular fracture.  She can weightbear as tolerated in the boot.  She will remain off work.  She will follow-up in 4 weeks and obtain new x-rays.  At that time we will start weaning out of the boot and start physical therapy.    Return in about 4 weeks (around 11/21/2019).  Johnrobert Foti Renee Nadeen Shipman, PA-C  10/24/2019, 14:03    I am seeing this patient independently with co-signing supervising physician present in clinic.  This note was partially generated using MModal Fluency Direct system, and there may be some incorrect words, spellings, and punctuation that were not noted in checking the note before saving

## 2019-10-24 NOTE — Nursing Note (Signed)
Review of Systems:  Constitutional: negative except fornone  Eyes: negative except forcontacts/glasses  Ears, nose, mouth, throat, and face: negative except fornone  Respiratory: negative except forpneumonia  Cardiovascular: negative except fornone  Gastrointestinal: negative except forreflux symptoms  Genitourinary:negative except fornone  Integument/breast: negative except fornone  Hematologic/lymphatic: negative except foreasy bruising and bleeding  Musculoskeletal:negative except forstiff joints, neck pain, back pain and bone pain  Neurological: negative except fornone  Behavioral/Psych: negative except fornone  Endocrine: negative except fordiabetes and thyroid disease

## 2019-10-24 NOTE — Telephone Encounter (Signed)
Called and left message that appointment had been made for her with Perlie Mayo for Dec. 21 @ 11 am.  That a letter will be following with date time, and location.

## 2019-10-26 ENCOUNTER — Other Ambulatory Visit: Payer: Self-pay

## 2019-10-26 ENCOUNTER — Ambulatory Visit (INDEPENDENT_AMBULATORY_CARE_PROVIDER_SITE_OTHER): Payer: Commercial Managed Care - PPO | Admitting: CARDIOVASCULAR DISEASE

## 2019-10-26 ENCOUNTER — Encounter (HOSPITAL_BASED_OUTPATIENT_CLINIC_OR_DEPARTMENT_OTHER): Payer: Self-pay | Admitting: CARDIOVASCULAR DISEASE

## 2019-10-26 VITALS — BP 102/68 | HR 80 | Ht 66.0 in | Wt 225.4 lb

## 2019-10-26 DIAGNOSIS — R0609 Other forms of dyspnea: Secondary | ICD-10-CM

## 2019-10-26 DIAGNOSIS — I6523 Occlusion and stenosis of bilateral carotid arteries: Secondary | ICD-10-CM

## 2019-10-26 DIAGNOSIS — Z951 Presence of aortocoronary bypass graft: Secondary | ICD-10-CM

## 2019-10-26 DIAGNOSIS — R0600 Dyspnea, unspecified: Secondary | ICD-10-CM

## 2019-10-26 DIAGNOSIS — E7800 Pure hypercholesterolemia, unspecified: Secondary | ICD-10-CM

## 2019-10-26 DIAGNOSIS — I2510 Atherosclerotic heart disease of native coronary artery without angina pectoris: Secondary | ICD-10-CM

## 2019-10-26 DIAGNOSIS — Z9861 Coronary angioplasty status: Secondary | ICD-10-CM

## 2019-10-26 DIAGNOSIS — F17200 Nicotine dependence, unspecified, uncomplicated: Secondary | ICD-10-CM

## 2019-10-26 MED ORDER — NITROGLYCERIN 0.4 MG SUBLINGUAL TABLET
0.40 mg | SUBLINGUAL_TABLET | SUBLINGUAL | 3 refills | Status: DC | PRN
Start: 2019-10-26 — End: 2021-03-25

## 2019-10-26 NOTE — Progress Notes (Signed)
[facility]  [address]  [place] [address]  [phone]          Date:   10/26/2019  Name: Monique [name]  Age: 53 y.o.    REASON FOR VISIT:  Follow-up for coronary artery disease    HISTORY OF PRESENTING ILLNESS:   Monique [name] is a 53 y.o. female  with history of coronary artery disease status post CABG in 2012 followed by MI which resulted in PCI to the LIMA to the LAD, left main and ostial circ.  Patient was last seen here in the office in September of 2017 prior to gastric sleeve surgery.  She has now lost over 100 lb.  At that time she was stable from a cardiovascular standpoint.  She also gives a history of having CVA and known totaled left internal carotid artery. She has not undergone a repeat carotid ultrasound recently.  has history of bilateral carotid artery stenosis.    Subjective:   Patient came today for follow-up.  She has no chest pain.  She has shortness of breath on exertion.  He denies having any palpitation.    Current Outpatient Medications   Medication Sig   . ACCU-CHEK AVIVA PLUS TEST STRP Strip 1 Strip by Subcutaneous route Three times a day as needed   . Blood-Glucose Meter (ACCU-CHEK AVIVA PLUS METER) Misc 1 Kit by Does not apply route Three times a day   . clopidogreL (PLAVIX) 75 mg Oral Tablet Take 1 Tab (75 mg total) by mouth Once a day   . cyanocobalamin (VITAMIN B12) 1,000 mcg/mL Injection Solution 1 mL (1,000 mcg total) by Subcutaneous route Every 30 days   . cyanocobalamin (VITAMIN B12) 1,000 mcg/mL Injection Solution INJECT 1 MILLILITER( 1000 MCG) INTRAMUSCULARLY EVERY MONTH   . diclofenac sodium (VOLTAREN) 1 % Gel 2 g by Apply Topically route Three times a day as needed   . docusate sodium (COLACE) 100 mg Oral Capsule Take 1 Cap (100 mg total) by mouth Twice daily   . empagliflozin-linagliptin (GLYXAMBI) 10-5 mg Oral Tablet TAKE 1 TABLET BY MOUTH ONCE DAILY   . ergocalciferol, vitamin D2, (DRISDOL) 1,250 mcg (50,000 unit) Oral  Capsule TAKE 1 CAPSULE BY MOUTH EVERY WEEK   . exenatide microspheres ER (BYDUREON) 2 mg Subcutaneous 2 mg by Subcutaneous route Every 7 days   . flash glucose scanning reader (FREESTYLE LIBRE 14 DAY READER) Does not apply Misc 1 Box by Does not apply route Three times a day   . flash glucose sensor (FREESTYLE LIBRE 14 DAY SENSOR) Does not apply Kit 1 Kit by Does not apply route Three times a day   . furosemide (LASIX) 40 mg Oral Tablet Take 1 Tab (40 mg total) by mouth Once a day   . HYDROcodone-acetaminophen (NORCO) 10-325 mg Oral Tablet Take 1 Tab by mouth Four times a day    . Ibuprofen (MOTRIN) 800 mg Oral Tablet Take 1 Tab (800 mg total) by mouth Three times a day as needed for Pain   . levothyroxine (SYNTHROID) 25 mcg Oral Tablet Take 25 mcg by mouth Once a day   . lisinopriL (PRINIVIL) 2.5 mg Oral Tablet 1 q day   . metFORMIN (GLUCOPHAGE) 500 mg Oral Tablet take 1 tablet by mouth twice a day with food   . methocarbamoL (ROBAXIN) 500 mg Oral Tablet Take 1 Tab (500 mg total) by mouth Three times a day as needed (muscle spasms)   . metoprolol succinate (TOPROL-XL) 25 mg  Oral Tablet Sustained Release 24 hr TAKE 1/2 TABLET ONCE DAILY   . NARCAN 4 mg/actuation Nasal Spray, Non-Aerosol PT STATED SHE HAS NARCAN AT HOME BUT IS NOT TAKING IT   . niacin (NIASPAN) 500 mg Oral Tablet Sustained Release Take 1 Tab (500 mg total) by mouth Once a day   . nitroGLYCERIN (NITROSTAT) 0.4 mg Sublingual Tablet, Sublingual 1 Tab (0.4 mg total) by Sublingual route Every 5 minutes as needed for Chest pain for 3 doses over 15 minutes   . omeprazole (PRILOSEC) 40 mg Oral Capsule, Delayed Release(E.C.) Take 1 Cap (40 mg total) by mouth Once a day   . ondansetron (ZOFRAN ODT) 8 mg Oral Tablet, Rapid Dissolve DISSOLVE 1 TABLET(8 MG) ON THE TONGUE EVERY 8 HOURS AS NEEDED FOR NAUSEA OR VOMITING   . phenazopyridine (PYRIDIUM) 200 mg Oral Tablet Take 1 Tab (200 mg total) by mouth Three times a day   . PRENATAL PLUS, CALCIUM CARB, 27 mg iron-  1 mg Oral Tablet Take 1 Tab by mouth Once a day   . PROVENTIL HFA 90 mcg/actuation Inhalation HFA Aerosol Inhaler Take 1-2 Puffs by inhalation Every 4 hours as needed   . rosuvastatin (CRESTOR) 20 mg Oral Tablet TAKE 1 TABLET BY MOUTH ONCE DAILY   . traMADoL (ULTRAM) 50 mg Oral Tablet Take 1 Tab (50 mg total) by mouth Every 6 hours as needed for Pain   . venlafaxine (EFFEXOR XR) 37.5 mg Oral Capsule, Sust. Release 24 hr Take 1 Cap (37.5 mg total) by mouth Once a day   . VENTOLIN HFA 90 mcg/actuation Inhalation HFA Aerosol Inhaler Take 1-2 Puffs by inhalation Every 6 hours as needed   . ZYRTEC-D 5-120 mg Oral Tablet Sustained Release 12 hr Take 1 Tab by mouth Once a day       Allergies   Allergen Reactions   . Ane Payment [Pyrilamine-Dextromethorphan]  Other Adverse Reaction (Add comment)     LIGHT HEADED   . Tylenol W Codeine [Acetaminophen-Codeine]  Other Adverse Reaction (Add comment)     pt states that one time she had chest pains with this med, but dr. thought it was because she had not eaten       Past Medical History:   Diagnosis Date   . Anxiety    . Carotid stenosis    . Chronic back pain    . Coronary artery disease involving native coronary artery of native heart    . DDD (degenerative disc disease), lumbar     "entire spine"   . Ear piercing    . GERD (gastroesophageal reflux disease)    . H/O complete eye exam     2 years, Dr. [name], at [facility]   . History of dental examination     dentures upper and lower   . HTN    . Hypercholesterolemia    . Hyperlipidemia    . MI (myocardial infarction) ([facility]) 2012   . Neck problem     herniated cervical disc   . Obesity (BMI 30-39.9)    . 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 [place] ordered.    . Stroke (CMS [facility])     2002   . Tattoo     Left breast, Right shoulder   . Thyroid disorder    . Thyroid nodule    . Uncontrolled type 2 diabetes mellitus, without long-term current use of insulin (CMS [facility])    .  Xanthelasma           Past Surgical History:   Procedure Laterality Date   . ENDOMETRIAL ABLATION  1998   . HX ANKLE FRACTURE TX  1990s    Right, Doran Durand procedure   . HX CATARACT REMOVAL Bilateral 2013    r and l eyes with implants   . HX CESAREAN SECTION  06/20/2000    x3, 11/21/86, 11/10/84   . HX CHOLECYSTECTOMY  1988   . HX CORONARY ARTERY BYPASS GRAFT  03/31/2011    cardiac, 2 vessel, Dr. [name], [facility]   . HX CORONARY STENT PLACEMENT  2012    x4 stents   . HX GASTRIC SLEEVE  02/2017    [place], [place], Dr. Pearlie Oyster   . HX HEART CATHETERIZATION  2012    massive heart attack and stents - [facility]   . HX THYROID BIOPSY     . HX THYROIDECTOMY  101/01/2018   . HX TONSILLECTOMY      as child   . HX YAG Left 09/20/2013         Family Medical History:     Problem Relation (Age of Onset)    Atrial fibrillation Sister    Colon Cancer Father    Diabetes Mother, Maternal Aunt    Heart Disease Sister, Maternal Grandfather    Hypertension (High Blood Pressure) Mother    Lung Cancer Maternal Grandfather    MI <71 years of age Mother            Social History     Socioeconomic History   . Marital status: Married     Spouse name: [name]   . Number of children: 3   . Years of education: completed 11th grade   . Highest education level: Not on file   Occupational History   . Occupation: disabled   Social Needs   . Financial resource strain: Not hard at all   . Food insecurity     Worry: Never true     Inability: Never true   . Transportation needs     Medical: No     Non-medical: No   Tobacco Use   . Smoking status: Current Every Day Smoker     Packs/day: 0.50     Years: 20.00     Pack years: 10.00     Types: Cigarettes     Start date: 21   . Smokeless tobacco: Never Used   Substance and Sexual Activity   . Alcohol use: No     Frequency: Never     Binge frequency: Never   . Drug use: No   . Sexual activity: Yes     Partners: Male   Lifestyle   . Physical activity     Days per week: 0 days     Minutes per session: 0 min   . Stress: To  some extent   Relationships   . Social connections     Talks on phone: More than three times a week     Gets together: More than three times a week     Attends religious service: Never     Active member of club or organization: No     Attends meetings of clubs or organizations: Never     Relationship status: Married   . Intimate partner violence     Fear of current or ex partner: No     Emotionally abused: No     Physically abused: No  Forced sexual activity: No   Other Topics Concern   . Routine Exercise No   . Ability to Walk 2 Flight of Steps without SOB/CP Yes   Social History Narrative    Living situation: lives at home with husband, [name], and daughter.  1 dog named Duke and 1 cat named Lucky.    Nutrition:  Eats 5 small meals d/t gastric sleeve surgery.     Caffeine use: none, decaf coffee    Exercise: stays busy around house. No formal form of exercise    Seatbelt use: sometimes    Fire extinguishers in the home: yes    Smoke alarms in the home: yes    Carbon monoxide detectors in the home: yes    Nancy Fetter exposure: sunglasses        Saidee would like for husband, Tatiana Courter,  to speak for her in the event she would become incapacitated.    Full code.        REVIEW OF SYSTEMS:  Constitutional: No fevers, chills, malaise or abnormal weight loss.    HENT: No tinnitus, vertigo, hearing loss, ear pain, sinus drainage, nasal congestion or throat pain.  Eyes: No vision changes, diplopia, drainage, pain  Respiratory: No cough, shortness of breath, wheezing  Cardiovascular: as in the HPI.    Gastrointestinal: No abdominal pain, nausea, vomiting, diarrhea, constipation, melana or hematochezia   Endocrine: No polyuria, polydipsia, heat or cold intolerance.  Genitourinary: No dysuria, urgency, frequency, hematuria, nocturia.  Musculoskeletal: No myalgias, arthralgias.  Skin: No rashes, suspicious lesions.  Neurological: No headaches, weakness, paresthesias  Hematological:  No bleeding, spontaneous  bruising.  Psychiatric/Behavioral: No confusion, agitation, hallucinations, paranoia, delusions.    All other systems reviewed and are negative except as noted in [place].    PHYSICAL EXAMINATION:     Vitals reviewed. BP 102/68 Comment: RTA  Pulse 80   Ht 1.676 m (5' 6" )   Wt 102 kg (225 lb 6.4 oz)   LMP  (LMP Unknown)   SpO2 94%   BMI 36.38 kg/m       Vitals Filed       10/26/2019  1136 10/26/2019  1137          BP:  --  102/68      Pulse:  80  --            Constitutional: She is oriented to person, place, and time.  She appears well-developed and well-nourished.   HEENT:  Normocephalic, atraumatic.  TM's clear and intact bilaterally.  Canals clear.  Nose clear with drainage.  Normal appearing nasal mucosa.  Posterior pharynx clear without lesions or exudate. Mucous membranes moist without lesions.   Neck: Supple. Thyroid normal without enlargement or palpable nodules.  Trachea midline.  Cardiovascular: Normal rate and rhythm. [No significant cardiac murmurs heard]. No  rubs or gallops.  No JVD.  No bruits.  No peripheral edema is noted.  Pulmonary/Chest: Lungs clear without wheezes, rales or rhonchi.  No chest wall tenderness or deformity.   Abdominal: Soft. Nontender, nondistended with normal bowel sounds in all quadrants. No hepatosplenomegaly. No guarding, rebound , abnormal masses.  Rectal exam deferred.  Musculoskeletal: Full and painless ROM in all joint groups.  Neurological: Alert and oriented x 4.  Cranial nerves 2-12 intact without defecits.  BUE's/BLE's.  Strength and sensation intact in all extremeties.   Skin: Skin is warm and dry. No rash or suspicious skin lesions noted.  Psychiatric:  Normal mood and affect. Speech is  normal and behavior is normal. Judgment and thought content normal. Cognition and memory are normal.      Labs:     Lab Results   Component Value Date    HA1C 10.4 (H) 09/29/2018    LDLCHOL 52 (L) 03/29/2018    TRIG 151 03/29/2018    HDLCHOL 52 03/29/2018    CHOLESTEROL 134  03/29/2018      Lab Results   Component Value Date    SODIUM 139 09/16/2018    POTASSIUM 4.8 09/16/2018    CHLORIDE 102 09/16/2018    CO2 29 09/16/2018    BUN 11 09/16/2018    CREATININE 1.20 09/16/2018    GFR 47 (L) 09/16/2018    CALCIUM 9.7 09/16/2018       ASSESSMENT:   ==================  ENCOUNTER DIAGNOSES     ICD-10-CM   1. CAD (coronary artery disease)  I25.10   2. Hx of CABG  Z95.1   3. Coronary arteriosclerosis after percutaneous transluminal coronary angioplasty (PTCA)  I25.10    Z98.61   4. Hypercholesterolemia  E78.00   5. Smoking  F17.200   6. Bilateral carotid artery stenosis  I65.23       PLAN:  =========================  1. History of coronary artery status post CABG:  Patient has dyspnea on exertion and occasional chest pain.  Will schedule stress test and echocardiogram.  Continue current treatment.  Refill nitroglycerin.    2. History of carotid artery stenosis:   Carotid ultrasound done in 2018 showed is a 50% stenosis in the internal carotid arteries bilateral.  Will get follow-up carotid ultrasound.      3. Current everyday smoker:  Patient counseled on importance of smoking cessation.    4. Hyperlipidemia:  Patient is on statins.  Continue current treatment.    Orders Placed This Encounter   . TRANSTHORACIC ECHOCARDIOGRAM - ADULT   . PCA - Carotid Artery Duplex   . MYOCARDIAL PERFUSION COMPLETE   . nitroGLYCERIN (NITROSTAT) 0.4 mg Sublingual Tablet, Sublingual       Return in about 4 weeks (around 11/23/2019) for NP.    Electronically signed by     Linus Galas, MD  10/26/2019, 13:43    This note may have been partially generated using MModal Fluency Direct system, and there may be some incorrect words, spellings, and punctuation that were not noted in checking the note before saving, though effort was made to avoid such errors.

## 2019-10-31 ENCOUNTER — Encounter (HOSPITAL_BASED_OUTPATIENT_CLINIC_OR_DEPARTMENT_OTHER): Payer: Self-pay

## 2019-11-07 ENCOUNTER — Ambulatory Visit (INDEPENDENT_AMBULATORY_CARE_PROVIDER_SITE_OTHER): Payer: Self-pay | Admitting: Foot & Ankle Surgery

## 2019-11-07 ENCOUNTER — Other Ambulatory Visit (HOSPITAL_BASED_OUTPATIENT_CLINIC_OR_DEPARTMENT_OTHER): Payer: Self-pay | Admitting: Family Medicine

## 2019-11-07 DIAGNOSIS — R110 Nausea: Secondary | ICD-10-CM

## 2019-11-09 ENCOUNTER — Other Ambulatory Visit: Payer: Self-pay

## 2019-11-09 ENCOUNTER — Ambulatory Visit (HOSPITAL_BASED_OUTPATIENT_CLINIC_OR_DEPARTMENT_OTHER)
Admission: RE | Admit: 2019-11-09 | Discharge: 2019-11-09 | Disposition: A | Discharge status: Home / Self Care | Payer: Commercial Managed Care - PPO | Source: Ambulatory Visit

## 2019-11-09 ENCOUNTER — Ambulatory Visit (HOSPITAL_BASED_OUTPATIENT_CLINIC_OR_DEPARTMENT_OTHER)
Admission: RE | Admit: 2019-11-09 | Discharge: 2019-11-09 | Disposition: A | Discharge status: Home / Self Care | Payer: Commercial Managed Care - PPO | Source: Ambulatory Visit | Attending: Interventional Cardiology | Admitting: Interventional Cardiology

## 2019-11-09 ENCOUNTER — Ambulatory Visit (HOSPITAL_BASED_OUTPATIENT_CLINIC_OR_DEPARTMENT_OTHER)
Admission: RE | Admit: 2019-11-09 | Discharge: 2019-11-09 | Disposition: A | Discharge status: Home / Self Care | Payer: Commercial Managed Care - PPO | Source: Ambulatory Visit | Admitting: Radiology

## 2019-11-09 ENCOUNTER — Ambulatory Visit
Admission: RE | Admit: 2019-11-09 | Discharge: 2019-11-09 | Disposition: A | Discharge status: Home / Self Care | Payer: Commercial Managed Care - PPO | Source: Ambulatory Visit | Attending: CARDIOVASCULAR DISEASE | Admitting: CARDIOVASCULAR DISEASE

## 2019-11-09 DIAGNOSIS — F17200 Nicotine dependence, unspecified, uncomplicated: Secondary | ICD-10-CM

## 2019-11-09 DIAGNOSIS — I6523 Occlusion and stenosis of bilateral carotid arteries: Secondary | ICD-10-CM | POA: Insufficient documentation

## 2019-11-09 DIAGNOSIS — E7800 Pure hypercholesterolemia, unspecified: Secondary | ICD-10-CM

## 2019-11-09 DIAGNOSIS — Z951 Presence of aortocoronary bypass graft: Secondary | ICD-10-CM

## 2019-11-09 DIAGNOSIS — Z9861 Coronary angioplasty status: Secondary | ICD-10-CM

## 2019-11-09 DIAGNOSIS — I2510 Atherosclerotic heart disease of native coronary artery without angina pectoris: Secondary | ICD-10-CM

## 2019-11-09 LAB — MYOCARDIAL PERFUSION COMPLETE
Angina Index: 0 — NL
Baseline HR: 87 {beats}/min — NL
Nuc Stress EF: 50 % — NL
Peak Diastolic BP for Stress Tests: 60 mm/Hg — NL
Peak Systolic BP Stress Test: 102 mm/Hg — NL
Post peak HR: 107 {beats}/min — NL
RPP: 10914 — NL
Resting DBP: 60 mmHg — NL
Resting SBP: 102 mmHG — NL
ST DEPRESSION - BASELINE: 0 mm — NL

## 2019-11-09 MED ORDER — REGADENOSON 0.4 MG/5 ML INTRAVENOUS SYRINGE
INJECTION | INTRAVENOUS | Status: AC
Start: 2019-11-09 — End: 2019-11-09
  Filled 2019-11-09: qty 5

## 2019-11-09 MED ORDER — REGADENOSON 0.4 MG/5 ML INTRAVENOUS SYRINGE
0.4000 mg | INJECTION | Freq: Once | INTRAVENOUS | Status: AC
Start: 2019-11-09 — End: 2019-11-09
  Administered 2019-11-09: 0.4 mg via INTRAVENOUS

## 2019-11-10 ENCOUNTER — Ambulatory Visit (HOSPITAL_BASED_OUTPATIENT_CLINIC_OR_DEPARTMENT_OTHER): Payer: Self-pay

## 2019-11-10 NOTE — Telephone Encounter (Signed)
Normal stress test results called to patient.   Estrella Myrtle, RN  11/10/2019, 13:03

## 2019-11-11 ENCOUNTER — Ambulatory Visit (HOSPITAL_COMMUNITY): Payer: Self-pay

## 2019-11-12 NOTE — Progress Notes (Signed)
Already responded too.

## 2019-11-12 NOTE — Progress Notes (Signed)
Please inform the pt that her Stress test results have been forwarded to this office. The results showed no evidence of pending heart attack.   I'm making no changes to her care plan.   We will review these together at our next encounter.   [place]

## 2019-11-12 NOTE — Progress Notes (Signed)
Please inform the pt that her carotid US results are back.  The left carotid artery is completely filled with plaque and the right side is half full. She should follow up with her speciality team for specific therapeutic recommendations. As always, we will review these together at our next encounter.     [place]

## 2019-11-14 NOTE — Progress Notes (Signed)
Called and notified pt of results, pt verbally understood.    Kameka Whan, LPN

## 2019-11-14 NOTE — Progress Notes (Signed)
Called and notified pt of results, pt verbally understood.    Averi Kilty, LPN

## 2019-11-14 NOTE — Progress Notes (Signed)
Called and notified pt of results, pt verbally understood.    Mykael Trott, LPN

## 2019-11-20 ENCOUNTER — Other Ambulatory Visit (HOSPITAL_BASED_OUTPATIENT_CLINIC_OR_DEPARTMENT_OTHER): Payer: Self-pay | Admitting: Family Medicine

## 2019-11-20 DIAGNOSIS — I2510 Atherosclerotic heart disease of native coronary artery without angina pectoris: Secondary | ICD-10-CM

## 2019-11-20 DIAGNOSIS — R609 Edema, unspecified: Secondary | ICD-10-CM

## 2019-11-21 ENCOUNTER — Encounter (INDEPENDENT_AMBULATORY_CARE_PROVIDER_SITE_OTHER): Payer: Self-pay | Admitting: PHYSICIAN ASSISTANT

## 2019-11-23 ENCOUNTER — Telehealth (INDEPENDENT_AMBULATORY_CARE_PROVIDER_SITE_OTHER): Payer: Commercial Managed Care - PPO | Admitting: Family

## 2019-11-23 DIAGNOSIS — Z951 Presence of aortocoronary bypass graft: Secondary | ICD-10-CM

## 2019-11-23 DIAGNOSIS — I6523 Occlusion and stenosis of bilateral carotid arteries: Secondary | ICD-10-CM

## 2019-11-23 DIAGNOSIS — I10 Essential (primary) hypertension: Secondary | ICD-10-CM

## 2019-11-23 DIAGNOSIS — E7800 Pure hypercholesterolemia, unspecified: Secondary | ICD-10-CM

## 2019-11-23 DIAGNOSIS — I2510 Atherosclerotic heart disease of native coronary artery without angina pectoris: Secondary | ICD-10-CM

## 2019-11-23 DIAGNOSIS — F17200 Nicotine dependence, unspecified, uncomplicated: Secondary | ICD-10-CM

## 2019-11-23 MED ORDER — METOPROLOL SUCCINATE ER 25 MG TABLET,EXTENDED RELEASE 24 HR
12.5000 mg | ORAL_TABLET | Freq: Every day | ORAL | 3 refills | Status: DC
Start: 2019-11-23 — End: 2021-01-03

## 2019-11-23 NOTE — Progress Notes (Signed)
CARDIOLOGY CLINIC, MEDICAL OFFICE [address]  [place]  [place] [address]    Telephone Visit    Name:  Monique [name] MRN: [id number]   Date:  11/23/2019 Age:   53 y.o.     The patient/family initiated a request for telephone service.  Verbal consent for this service was obtained from the patient/family.    Last office visit in this department: 10/26/2019      Reason for call: Cardiac care follow up.   Call notes:    Meggie Cumings is a 53 y.o. female with history of coronary artery disease status post CABG in 2012 followed by MI which resulted in PCI to the LIMA to the LAD, left main and ostial circ. She has had gastric sleeve surgery with weight loss of 100lbs. 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 Her last office visit was with Dr. Liam Graham on 10/26/19 and she reported shortness of breath with exertion. Stress test revealed no evidence of ischemia. Echocardiogram showed normal LV function and no signficant valvular abnormality. Carotid artery ultrasound showed right internal carotid artery with less than 50% stenosis and no flow detected in the left ICA.     Phone visit completed today for follow up. She denies chest pain, shortness of breath, palpitations, syncope, or near syncope. Her blood pressure is generally well controlled. She continues to smoke 1/2 ppd. She reports that she has slacked off on exercising in the last few months since COVID pandemic worsened but was previously walking several days per week.       ICD-10-CM    1. Coronary artery disease, angina presence unspecified, unspecified vessel or lesion type, unspecified whether native or transplanted heart  I25.10    2. Hx of CABG  Z95.1    3. Hypertension, unspecified type  I10    4. Hypercholesterolemia  E78.00    5. Bilateral carotid artery stenosis  I65.23    6. Smoking  F17.200       Coronary artery status post CABG- She denies anginal symptoms on today's exam.  She reports that she has been active without limitation. Recent stress test revealed no evidence of ischemia.  Echocardiogram shows normal LV function with no significant valvular abnormalities.  She will continue Plavix, beta-blocker, and statin.  She does have sublingual nitroglycerin for use as needed.    History of carotid artery stenosis-carotid artery duplex on 11/09/2027 revealed less than 50% stenosis to the right internal carotid artery and no flow detected in the left ICA.  When compared to carotid artery ultrasound in 2016 no significant changes. She did have carotid artery ultrasound in 2018 that showed less than 50% bilaterally. However, she reports that she has been told for years that her left internal carotid artery was occluded. Upon review of records, the left internal carotid artery does appear to be totally occluded since 2012.     Hypertension-controlled per her report.  Continue current medical therapy.    Hyperlipidemia-she is tolerating statins.  Lipid panel is ordered.    Nicotine use-she admits that she smokes 1/2 pack per day.  Three to 5 minutes on smoking cessation and treatment modalities discussed with her on today's exam.    Total provider time spent with the patient on the phone: 18 minutes.  Start time: 1453  End time: [place], APRN,NP-C  11/23/2019, 15:19    I have reviewed the history and  physical findings and agree with the above assessment and plan of nurse practitioner clinic note.    Linus Galas, MD  11/24/2019, 14:19

## 2019-11-23 NOTE — Patient Instructions (Addendum)
Continue same meds     Monitor blood pressure for goal of 130/80 or less and notify office with persistently elevated blood pressures.     Healthy diet and exercise 20-30 minutes 5 times per week.     Sodium restriction.     Stop smoking.

## 2019-12-05 ENCOUNTER — Other Ambulatory Visit (HOSPITAL_BASED_OUTPATIENT_CLINIC_OR_DEPARTMENT_OTHER): Payer: Self-pay | Admitting: Family Medicine

## 2019-12-05 DIAGNOSIS — S46919A Strain of unspecified muscle, fascia and tendon at shoulder and upper arm level, unspecified arm, initial encounter: Secondary | ICD-10-CM | POA: Insufficient documentation

## 2019-12-05 DIAGNOSIS — J40 Bronchitis, not specified as acute or chronic: Secondary | ICD-10-CM | POA: Insufficient documentation

## 2019-12-05 DIAGNOSIS — S62609A Fracture of unspecified phalanx of unspecified finger, initial encounter for closed fracture: Secondary | ICD-10-CM | POA: Insufficient documentation

## 2019-12-05 DIAGNOSIS — E538 Deficiency of other specified B group vitamins: Secondary | ICD-10-CM

## 2019-12-05 DIAGNOSIS — M62838 Other muscle spasm: Secondary | ICD-10-CM | POA: Insufficient documentation

## 2019-12-05 DIAGNOSIS — J069 Acute upper respiratory infection, unspecified: Secondary | ICD-10-CM | POA: Insufficient documentation

## 2019-12-05 DIAGNOSIS — R112 Nausea with vomiting, unspecified: Secondary | ICD-10-CM | POA: Insufficient documentation

## 2019-12-05 DIAGNOSIS — E079 Disorder of thyroid, unspecified: Secondary | ICD-10-CM

## 2019-12-05 DIAGNOSIS — M549 Dorsalgia, unspecified: Secondary | ICD-10-CM | POA: Insufficient documentation

## 2019-12-05 DIAGNOSIS — S93409A Sprain of unspecified ligament of unspecified ankle, initial encounter: Secondary | ICD-10-CM | POA: Insufficient documentation

## 2019-12-05 DIAGNOSIS — E1165 Type 2 diabetes mellitus with hyperglycemia: Secondary | ICD-10-CM | POA: Insufficient documentation

## 2019-12-05 DIAGNOSIS — R110 Nausea: Secondary | ICD-10-CM

## 2019-12-05 NOTE — Telephone Encounter (Signed)
Regarding: Dr Russ Halo  ----- Message from Laurance Flatten sent at 12/05/2019  3:12 PM EST -----  Med refill    .  #cyanocobalamin (VITAMIN B12) 1,000 mcg/mL Injection Solution, 1 mL (1,000 mcg total) by Subcutaneous route Every 30 days    .  #levothyroxine (SYNTHROID) 25 mcg Oral Tablet, Take 25 mcg by mouth Once a day    .  #ondansetron (ZOFRAN ODT) 8 mg Oral Tablet, Rapid Dissolve, DISSOLVE 1 TABLET(8 MG) ON THE TONGUE EVERY 8 HOURS AS NEEDED FOR NAUSEA OR VOMITING      Preferred [facility] [phone] [facility], [place] - [address] AT [address]    [address] [place] [address]    Phone: [phone] Fax: [phone]    Not a 24 hour pharmacy; exact hours not known.

## 2019-12-06 ENCOUNTER — Other Ambulatory Visit (HOSPITAL_BASED_OUTPATIENT_CLINIC_OR_DEPARTMENT_OTHER): Payer: Self-pay | Admitting: CARDIOVASCULAR DISEASE

## 2019-12-06 MED ORDER — CYANOCOBALAMIN (VIT B-12) 1,000 MCG/ML INJECTION SOLUTION
1000.00 ug | INTRAMUSCULAR | 3 refills | Status: DC
Start: 2019-12-06 — End: 2020-01-25

## 2019-12-06 MED ORDER — PRENATAL PLUS (CALCIUM CARBONATE) 27 MG IRON-1 MG TABLET
1.0000 | ORAL_TABLET | Freq: Every day | ORAL | 3 refills | Status: DC
Start: 2019-12-06 — End: 2021-12-30

## 2019-12-06 MED ORDER — LEVOTHYROXINE 25 MCG TABLET
25.0000 ug | ORAL_TABLET | Freq: Every day | ORAL | 3 refills | Status: DC
Start: 2019-12-06 — End: 2021-01-03

## 2019-12-06 MED ORDER — ONDANSETRON 8 MG DISINTEGRATING TABLET
ORAL_TABLET | ORAL | 3 refills | Status: DC
Start: 2019-12-06 — End: 2021-01-01

## 2019-12-07 ENCOUNTER — Other Ambulatory Visit (HOSPITAL_BASED_OUTPATIENT_CLINIC_OR_DEPARTMENT_OTHER): Payer: Self-pay | Admitting: Family Medicine

## 2019-12-14 ENCOUNTER — Ambulatory Visit (INDEPENDENT_AMBULATORY_CARE_PROVIDER_SITE_OTHER): Payer: Commercial Managed Care - PPO | Admitting: Family Medicine

## 2019-12-14 ENCOUNTER — Encounter (HOSPITAL_BASED_OUTPATIENT_CLINIC_OR_DEPARTMENT_OTHER): Payer: Self-pay | Admitting: Family Medicine

## 2019-12-14 ENCOUNTER — Other Ambulatory Visit: Payer: Self-pay

## 2019-12-14 VITALS — BP 102/70 | HR 82 | Temp 97.1°F | Resp 14 | Ht 66.0 in | Wt 214.0 lb

## 2019-12-14 DIAGNOSIS — M79605 Pain in left leg: Secondary | ICD-10-CM

## 2019-12-14 DIAGNOSIS — Z7984 Long term (current) use of oral hypoglycemic drugs: Secondary | ICD-10-CM

## 2019-12-14 DIAGNOSIS — M79604 Pain in right leg: Secondary | ICD-10-CM

## 2019-12-14 DIAGNOSIS — E119 Type 2 diabetes mellitus without complications: Secondary | ICD-10-CM

## 2019-12-14 DIAGNOSIS — E162 Hypoglycemia, unspecified: Secondary | ICD-10-CM

## 2019-12-14 NOTE — Progress Notes (Signed)
Patient Name: Monique [name]  Date: 12/14/2019  CCM [facility] [place], [place]  [place]  [place] [address]  [phone]  MRN: [id number]  DOB: 10-18-1966    Chief complaint: The patient is a 54 y.o. old female who came in today for Diabetes.     HPI:    Diabetes: Pt last A1C was 9.7% Pt A1C today was 9.6. Pt denies any polyuria, polydipsia, parathesia, foot ulcers, vision disturbances. Pt currently taking bydureon and  metformin for treatment. Patient is compliant with medications. Denies SE.  We discussed briefly her elevated A1c despite her significant weight loss over the last year secondary to bariatric surgery.    ROS:  Constitutional: No fever, chills  Skin: No rashes or lesions  HENT: No sore throat, ear pain, or difficulty swallowing,  Eyes: No vision changes, redness, discharge  Cardio: No chest pain, palpitations   Respiratory: No cough, wheezing, endorses exertional SOB  GI:  No nausea or vomiting. No diarrhea or constipation. No abdominal pain-reflux.  GU:  No dysuria, hematuria, polyuria  MSK: No new joint pain. Knee pain continues.   No new neck or back pain-chronic condition not significantly changed today.  Has been sent to pain management since our last encounter.  Waiting call back, delay likely secondary to holiday and COVID crisis.  Patient complains of worsening leg pain.  Neuro: No numbness, tingling, or weakness.  No headache  All other systems reviewed and are negative, unless commented on in the HPI.     Past medical history:  Past Medical History:   Diagnosis Date   . Anxiety    . Carotid stenosis    . Chronic back pain    . Coronary artery disease involving native coronary artery of native heart    . DDD (degenerative disc disease), lumbar     "entire spine"   . Ear piercing    . GERD (gastroesophageal reflux disease)    . H/O complete eye exam     2 years, Dr. [name], at [facility]   . History of dental examination     dentures upper and lower   . HTN     . Hypercholesterolemia    . Hyperlipidemia    . MI (myocardial infarction) (CMS [place]) 2012   . Neck problem     herniated cervical disc   . Obesity (BMI 30-39.9)    . 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 [place] ordered.    . Stroke (CMS [facility])     2002   . Tattoo     Left breast, Right shoulder   . Thyroid disorder    . Thyroid nodule    . Uncontrolled type 2 diabetes mellitus, without long-term current use of insulin (CMS [place])    . Xanthelasma      Past surgical history:  Past Surgical History:   Procedure Laterality Date   . ENDOMETRIAL ABLATION  1998   . HX ANKLE FRACTURE TX  1990s    Right, Doran Durand procedure   . HX CATARACT REMOVAL Bilateral 2013    r and l eyes with implants   . HX CESAREAN SECTION  06/20/2000    x3, 11/21/86, 11/10/84   . HX CHOLECYSTECTOMY  1988   . HX CORONARY ARTERY BYPASS GRAFT  03/31/2011    cardiac, 2 vessel, Dr. [name], [facility]   . HX CORONARY STENT PLACEMENT  2012  x4 stents   . HX GASTRIC SLEEVE  02/2017    [place], [place], Dr. Pearlie Oyster   . HX HEART CATHETERIZATION  2012    massive heart attack and stents - [facility]   . HX THYROID BIOPSY     . HX THYROIDECTOMY  101/01/2018   . HX TONSILLECTOMY      as child   . HX YAG Left 09/20/2013     Medications:  Current Outpatient Medications   Medication Sig   . ACCU-CHEK AVIVA PLUS TEST STRP Strip 1 Strip by Subcutaneous route Three times a day as needed   . Blood-Glucose Meter (ACCU-CHEK AVIVA PLUS METER) Misc 1 Kit by Does not apply route Three times a day   . clopidogreL (PLAVIX) 75 mg Oral Tablet Take 1 Tab (75 mg total) by mouth Once a day   . cyanocobalamin (VITAMIN B12) 1,000 mcg/mL Injection Solution INJECT 1 MILLILITER( 1000 MCG) INTRAMUSCULARLY EVERY MONTH   . cyanocobalamin (VITAMIN B12) 1,000 mcg/mL Injection Solution 1 mL (1,000 mcg total) by Subcutaneous route Every 30 days   . diclofenac sodium (VOLTAREN) 1 % Gel 2 g by Apply Topically route Three times a day as needed    . docusate sodium (COLACE) 100 mg Oral Capsule Take 1 Cap (100 mg total) by mouth Twice daily   . empagliflozin-linagliptin (GLYXAMBI) 10-5 mg Oral Tablet TAKE 1 TABLET BY MOUTH ONCE DAILY   . ergocalciferol, vitamin D2, (DRISDOL) 1,250 mcg (50,000 unit) Oral Capsule TAKE 1 CAPSULE BY MOUTH EVERY WEEK   . exenatide microspheres ER (BYDUREON) 2 mg Subcutaneous 2 mg by Subcutaneous route Every 7 days   . flash glucose scanning reader (FREESTYLE LIBRE 14 DAY READER) Does not apply Misc 1 Box by Does not apply route Three times a day   . flash glucose sensor (FREESTYLE LIBRE 14 DAY SENSOR) Does not apply Kit 1 Kit by Does not apply route Three times a day   . furosemide (LASIX) 40 mg Oral Tablet TAKE 1 TABLET(40 MG) BY MOUTH EVERY DAY   . Ibuprofen (MOTRIN) 800 mg Oral Tablet Take 1 Tab (800 mg total) by mouth Three times a day as needed for Pain   . levothyroxine (SYNTHROID) 25 mcg Oral Tablet Take 1 Tab (25 mcg total) by mouth Once a day   . lisinopriL (PRINIVIL) 2.5 mg Oral Tablet TAKE 1 TABLET BY MOUTH EVERY DAY   . metFORMIN (GLUCOPHAGE) 500 mg Oral Tablet take 1 tablet by mouth twice a day with food   . methocarbamoL (ROBAXIN) 500 mg Oral Tablet Take 1 Tab (500 mg total) by mouth Three times a day as needed (muscle spasms)   . metoprolol succinate (TOPROL-XL) 25 mg Oral Tablet Sustained Release 24 hr Take 0.5 Tabs (12.5 mg total) by mouth Once a day   . NARCAN 4 mg/actuation Nasal Spray, Non-Aerosol PT STATED SHE HAS NARCAN AT HOME BUT IS NOT TAKING IT   . niacin (NIASPAN) 500 mg Oral Tablet Sustained Release Take 1 Tab (500 mg total) by mouth Once a day   . nitroGLYCERIN (NITROSTAT) 0.4 mg Sublingual Tablet, Sublingual 1 Tab (0.4 mg total) by Sublingual route Every 5 minutes as needed for Chest pain for 3 doses over 15 minutes   . omeprazole (PRILOSEC) 40 mg Oral Capsule, Delayed Release(E.C.) Take 1 Cap (40 mg total) by mouth Once a day   . ondansetron (ZOFRAN ODT) 8 mg Oral Tablet, Rapid Dissolve DISSOLVE 1  TABLET(8 MG) ON THE TONGUE EVERY 8 HOURS AS NEEDED  FOR NAUSEA OR VOMITING   . PRENATAL PLUS, CALCIUM CARB, 27 mg iron- 1 mg Oral Tablet Take 1 Tab by mouth Once a day   . PROVENTIL HFA 90 mcg/actuation Inhalation HFA Aerosol Inhaler Take 1-2 Puffs by inhalation Every 4 hours as needed   . rosuvastatin (CRESTOR) 20 mg Oral Tablet TAKE 1 TABLET BY MOUTH ONCE DAILY   . traMADoL (ULTRAM) 50 mg Oral Tablet Take 1 Tab (50 mg total) by mouth Every 6 hours as needed for Pain   . venlafaxine (EFFEXOR XR) 37.5 mg Oral Capsule, Sust. Release 24 hr Take 1 Cap (37.5 mg total) by mouth Once a day   . VENTOLIN HFA 90 mcg/actuation Inhalation HFA Aerosol Inhaler Take 1-2 Puffs by inhalation Every 6 hours as needed   . ZYRTEC-D 5-120 mg Oral Tablet Sustained Release 12 hr Take 1 Tab by mouth Once a day     Vitals:    12/14/19 1108   BP: 102/70   Pulse: 82   Resp: 14   Temp: 36.2 C (97.1 F)   SpO2: 99%   Weight: 97.1 kg (214 lb)   Height: 1.676 m (5' 6" )   BMI: 34.61           Physical Examination:    GENERAL:   Pt is a pleasant, well-nourished, well-developed 54 y.o. female who is in NAD. Appears stated age  3:  head normocephalic, symmetrical facies. EOM intact b/l. PERRLA. Sclera non-icteric, non-injected. upper eyelid w/Xythoma-lipid plaques, diminished in fullness and intensity-in fact nearly resolved..  No rhinorrhea. Oropharyngeal mucous membranes are moist  w/o erythema/exudates   NECK:   No masses, no lymphadenopathy, no JVD on exam. Trachea midline, no thyromegaly   CV: S1, S2. No murmurs, rubs, gallops.     LUNGS: CTAB, No rhonchi, rales, wheezes.   GI: (+) BS in all 4 quadrants. soft, NT/ND. No rigidity/ guarding/ rebound. No organomegaly/masses, or abdominal bruits  MSK: Spontaneous normal AROM of major joints as observed during exam. No joint effusions/ swelling/ deformities.   Left paraspinal pain on palpation L4-L5.  Some tenderness on compression of the SI joint on the left.  Left, posterior femoral acetabular  joint pain on palpation.  No jump sign.  Positive hip telescoping for pain on the left.  Patient has medial joint pain about the left knee.  Medio-lateral stress does not reveal instability.  Anterior posterior translation does not reveal instability.  No BLE edema, clubbing, or cyanosis.   2+ radial pulse present b/l.   + 2 reflexes Brachioradialis and patellar bilaterally.   Gait normal.  SKIN: No significant lesions, rashes, ecchymoses noted.   NEURO: AAOx4, CN grossly intact. Sensation intact b/l. No focal deficits.  PSYCH: Mood, behavior and affect normal w/ Intact judgement & insight    Diabetes Monitors    A1C: 9.7  A1C Date: 09/12/2019         9.6 today.  Nephropathy Screening: On ACEI or ARB    Last Lipid Panel  (Last result in the past 2 years)      Cholesterol   HDL   LDL   Direct LDL   Triglycerides      03/29/18 0958 134  Comment:  Total Cholesterol Classification:     <200        Desirable        200-239     Borderline High        > or = 240  High 52  Comment:  HDL Cholesterol  Classification:          <40        Low        > or = 60  High   52  Comment:  LDL Cholesterol Classification:         <100     Optimal       100-129  Near or Above Optimal       130-159  Borderline High       160-189  High       >190     Very High   151  Comment:  Triglyceride Classification:            <150    Normal       150-199 Borderline-High       200-499 High       >500    Very High        Retinal Exam Date: 02/01/2019  Last diabetic foot exam: 06/01/2019       Xr Lumbar Spine Series  Result Date: 08/31/2019  Lumbar spine: CLINICAL HISTORY: Chronic low back pain. 5 views of lumbar spine were obtained and compared with the prior exam of 07/15/2012. The findings are listed below.     1. Osteopenia but no definite acute fracture or destructive bony process.   2. Moderate facet arthritis in the lower lumbar spine which is grossly stable.   3. Mild degenerative disc disease most pronounced near the thoracolumbar junction. The  lumbar disks are relatively well-preserved.   4. Extensive vascular calcification is noted in the aorta and iliac vessels. Radiologist location ID: [id number]     Xr Hip Left W Pelvis 2-3 Views  Result Date: 08/31/2019  Pelvis and left hip: CLINICAL HISTORY: Left hip pain with no known injury. A frontal view of the pelvis and oblique view of the left hip were obtained. The findings are listed below.     1. The bone mineralization is slightly diminished but there is no acute fracture or dislocation.  2. Mild degenerative changes involving the hips with slight sclerosis along the superior acetabular margins.   3. Heavy vascular calcification is noted. Radiologist location ID: [id number]     Xr Knee Left 4 Or More Views  Result Date: 08/31/2019  History: Knee pain. 4 views left knee are obtained. No acute fracture or dislocation is seen. There is tricompartmental osteoarthritis. There is joint space narrowing with osteophyte formation most pronounced at the patellofemoral and medial femoral tibial joints. Soft tissue calcification is seen medially. There is no large effusion.     1. Mild degenerative changes present about the left knee with no acute bony abnormality.  Radiologist location ID: [id number]     Carotid artery duplex  11/09/2019  Findings:  There is mild intimal thickening and luminal irregularities with some areas of calcified plaques in the common carotid arteries, internal carotid arteries and external carotid carotid arteries bilateral.    Peak systolic velocity:  Right ICA 78 cm/sec and left ICA no flow detected.    The CCA to ICA ratio on the right side is 1.17, which is with in the normal limit.  No flow detected in the left ICA.      Visit Diagnosis    Encounter Diagnoses   Name Primary?   . Type 2 diabetes mellitus (CMS [place]) Yes   . Pain in both lower extremities      Orders Placed This Encounter   . BASIC METABOLIC PANEL   .  MAGNESIUM   . POCT HGB A1C     Diabetes (Pt last A1C was 9.7. Pt does check  her blood sugars at home daily. Pt A1C today was 9.6. Pt denies any polyuria, polydipsia, parathesia, foot ulcers, vision disturbances. Pt currently taking bydureon and  metformin for treatment.)        Type 2 diabetes mellitus with complication, without long-term current use of insulin (CMS [facility])  Chronic,improved control  HbA1C now 9.6%.  Down from 9.7.  She will continue work on diet.  Continue to address weight loss which is stalled.  Continue metformin 500 mg BID.   Continue Bydureon 2 mg every 7 days.    Continue Glyxambi 10-5 mg.    Encouraged her to check blood sugars 3-4 times daily.  Discuss renal concerns and need for monitoring.   Are treatment is focused on weight loss she agreed to lose 10#  over last three months, has lost 4#.    Coronary artery disease  Status post CABG  Continue Plavix  Continue beta-blocker  Continue statin.  Continue sublingual nitro p.r.n. for chest pain.  Continue with Cardiology.    Depression, unspecified depression type  Chronic, controlled.  Continue Effexor    Weight loss:  Loss has stalled.   She has previously offered to restart Adipex,   I previously declined.   I offer for her to return to walking.     GERD  Chronic, Uncontrolled problem   Encouraged lifestyle modification to help with acid reflux, including:   Weight loss   Avoid eating for 2-3 hours prior to bedtime. Avoid late night snacking.    Avoid hot/spicy and acidic foods and overeating.    Remain upright after eating, avoid tight clothing.    Elevate head of bed.   Chew gum or lozenges.   Refrain from alcohol and smoking.   Continue histamine blocker.  Continue  proton pump inhibitor.    Chronic left-sided lower extremity pain  We review her imaging to her satisfaction.  Copies of the results are offered to the patient.  Patient with osteoarthritis  Has comorbid peripheral vascular disease  Will increase her statin follow.  Patient has follow-ups with orthopedics for the hip and knee.  Continue follow-up  with Dr. Lorra Hals for low back pain.    We again discussed use of weight loss as a primary method for pain relief.  Continue on muscle relaxer.  Patient avoids nonsteroidals secondary to her bariatric surgery.  Consider new leg pain the result of electrolyte deficiency will check BMP.     Carotid Art Stenosis:   50% stenosis to the right ICA  100% stenosis to left ICA.  Continue with Cardiology, Interventional Cardiology    Immunization requested:  At the patient's request we drafted a letter to allow her to obtain immunization for COVID as soon as possible.    Return in about 3 months (around 03/13/2020), or if symptoms worsen or fail to improve, for Diabetes, weight loss?  Chronic pain.    Cherly Beach, MD  12/14/2019, 11:25    This note was partially generated using MModal Fluency Direct system, and there may be some incorrect words, spellings, and punctuation that were not noted in checking the note before saving.

## 2019-12-15 ENCOUNTER — Ambulatory Visit (HOSPITAL_BASED_OUTPATIENT_CLINIC_OR_DEPARTMENT_OTHER): Payer: Self-pay | Admitting: Family Medicine

## 2019-12-15 NOTE — Telephone Encounter (Signed)
It looks like she is already taking ibuprofen, Tramadol, and has a muscle relaxant available. Have any of these medications been effective at helping her muscle cramps?     I would also encourage her to get the labs performed that were ordered by Dr. Liam Graham in 09/2019 to get for electrolyte issues that may be causing. Encourage her to stretch and get 30-60 minutes of exercise during the day, and stay hydrated. If these measures are not effective, we can discuss further at her follow-up appointment or with an acute visit.     Also, please let her know that a letter has been written for her to pick up and take to the Health Department with her for the COVID vaccine. I am not sure if it will help her get it sooner, as we are limited to the supply that we are given.     Chryl Heck, PA-C  12/15/2019, 09:52

## 2019-12-15 NOTE — Telephone Encounter (Signed)
Regarding: Dr. Russ Halo  ----- Message from Myna Hidalgo sent at 12/14/2019 12:37 PM EST -----  Patient states that when she was here she forgot to ask Dr. Russ Halo what she could do for leg cramps.  Patient states that she gets them so bad that it brings tears.  Patient also states that she went to Pharmacy and they told her if we send a referral to the Health Department they would move her and her husband up the list for the Covid Vaccine do to Health conditions.

## 2019-12-19 ENCOUNTER — Other Ambulatory Visit (HOSPITAL_BASED_OUTPATIENT_CLINIC_OR_DEPARTMENT_OTHER): Payer: Self-pay | Admitting: Family Medicine

## 2019-12-19 DIAGNOSIS — E119 Type 2 diabetes mellitus without complications: Secondary | ICD-10-CM

## 2019-12-19 MED ORDER — GLYXAMBI 10 MG-5 MG TABLET
ORAL_TABLET | ORAL | 3 refills | Status: DC
Start: 2019-12-19 — End: 2021-01-03

## 2019-12-21 ENCOUNTER — Ambulatory Visit: Payer: Self-pay | Admitting: Gastroenterology

## 2019-12-28 ENCOUNTER — Other Ambulatory Visit (HOSPITAL_BASED_OUTPATIENT_CLINIC_OR_DEPARTMENT_OTHER): Payer: Self-pay | Admitting: Family Medicine

## 2019-12-28 DIAGNOSIS — E119 Type 2 diabetes mellitus without complications: Secondary | ICD-10-CM

## 2019-12-30 ENCOUNTER — Ambulatory Visit (HOSPITAL_BASED_OUTPATIENT_CLINIC_OR_DEPARTMENT_OTHER): Payer: Self-pay | Admitting: Family Medicine

## 2019-12-30 NOTE — Telephone Encounter (Signed)
Regarding: Monique [name], Monique [name]   ----- Message from Lacie Draft sent at 12/30/2019 10:56 AM EST -----  wANTS TO HAVE ANOTHER LETTER WITH ALL HER HEALTH CONDITIONS FOR HER TO GET THE COVID SHOT NEED ONE FOR RANDY TOO

## 2020-01-05 ENCOUNTER — Encounter: Payer: Self-pay | Admitting: Gastroenterology

## 2020-01-05 ENCOUNTER — Other Ambulatory Visit: Payer: Self-pay

## 2020-01-05 ENCOUNTER — Telehealth: Payer: Self-pay

## 2020-01-05 ENCOUNTER — Ambulatory Visit (INDEPENDENT_AMBULATORY_CARE_PROVIDER_SITE_OTHER): Payer: Managed Care, Other (non HMO) | Admitting: Gastroenterology

## 2020-01-05 VITALS — BP 139/83 | HR 67 | Temp 96.9°F | Ht 66.0 in | Wt 135.6 lb

## 2020-01-05 DIAGNOSIS — R1013 Epigastric pain: Secondary | ICD-10-CM

## 2020-01-05 MED ORDER — PANCRELIPASE (LIP-PROT-AMYL) 36000-114000 UNITS PO CPEP: 72000.0000 [IU] | ORAL_CAPSULE | Freq: Three times a day (TID) | ORAL | 5 refills | Status: DC

## 2020-01-05 NOTE — Patient Instructions (Signed)
I am requesting blood work stat from your primary care.  We are pursuing a CT scan for further evaluation.  Please continue Nexium once each morning, 30 minutes before breakfast.  I have sent in Creon prescription, but also start taking the samples. 2 capsules with meals and 1 with snacks.  We may need to do an endoscopic ultrasound/ERCP again.  Further recommendations to follow!  I enjoyed seeing you again today! As you know, I value our relationship and want to provide genuine, compassionate, and quality care. I welcome your feedback. If you receive a survey regarding your visit,  I greatly appreciate you taking time to fill this out. See you next time!  Gelene Mink, PhD, ANP-BC [facility]

## 2020-01-05 NOTE — Progress Notes (Signed)
Referring Provider: Caryl Bis, MD Primary Care Physician:  Caryl Bis, MD Primary GI: Dr. Oneida Alar   Chief Complaint  Patient presents with  . Follow-up    HPI:   Ann [name] is a 54 y.o. 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 Multiple previous ERCPs outside facility after bile leak s/p cholecystectomy. Has seen multiple GI practices. Has declined return to tertiary care facility due to existing metal stent. Has been taking Creon. Dysphagia historically with last EGD/dilation in March 2019.   Prior evaluation: Starting in 2010 after cholecystectomy: She had a bile leak s/p cholecystectomy, requiring ERCP with stent placement for biliary stricture. She reports multiple ERCPs at outside facility with multiple stents placed. She notes that each time stent would be removed, she would develop abdominal pain. She reports having "8-10 ERCPs". Liver biopsy by Dr. Doyle Askew 2011: non-specific findings. Looking back at LFTs, she has had bumps in transaminases from the upper 200s to 400/600, with bilirubin ranging from 1-3.  2013 had fluctuating transaminases as well but less severe. During hospitalization in [facility] Dec 2015 for acute pancreatitis, peak AST 243, ALT 206, bilirubin normal at 1. A,lk phos 342, improving with supportive care. 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    ERCP in 2012 by Dr. Christoper Fabian with moderate stricture of the distal CBD with moderate dilatation of CBD, common hepatic duct, and intrahepatic biliary tree. In Aug 2012, a fully covered metal wall stent (10X 40 mm) was placed. Brushings around 2012 negative for malignancy. When seen by Dr. Jerene Pitch at [facility] in 2013, question of secondary sclerosing cholangitis. EGD/EUS March 2013 normal. Recommended stent removal at  that time, but she declined due to fear that pain would recur. She was inpatient in [place] and was assessed by Dr. Paulita Fujita in 2015 with abdominal pain and abnormal CT scan noting thickening of the head of pancreas with non-pathologic sized peripancreatic adenopathy. Felt she may have acute on chronic pancreatitis at that time. She was to follow-up with Dr. Paulita Fujita Jan 2016 but did not do this. CA 19-9 low in 2015   Last CT on file in 2019. Debris in biliary stent lumen at time of EGD March 26th. She was supposed to return to [facility] but has declined. Fluctuating LFTs felt most likely due to intermittent occlusion of stent.   Today, returns with abdominal pain: Has had to change eating habits. PCP recently completed blood work. Alk Phos in 200s, transaminases elevated per patient. I have not received labs. States she has been having RUQ pain just under rib cage and epigastric pain. Hurting diffusely lately. "hurting everywhere". Since November has had worsening pain. Has always had underlying pain but worsening since November. Postprandial component but sometimes occurs without eating.   Daily headaches with waking. Chronic dry cough. Notes pruritis. Knot came up on her right thigh, red, present about 2 weeks then went away. Notes nausea, shortness of breath with exertion. Experiencing some bowel incontinence. Having worsening GERD symptoms. Got Nexium OTC, which helped right away. Not taking Dexilant any longer. Will take Nexium. Not taking Pancreas enzymes any longer. Bloating better with eating habit changes. No recent antibiotics. Stool appears oily at times. Has had incontinent episodes. Stool is soft. Has BM every 1-2 days. Has had 3 episodes of bowel incontinence.   Weight loss  noted since 2019 of about 10 lbs when last seen.   Declining evaluation at tertiary center. Does not want to go back to [facility].   Past Medical History:  Diagnosis Date  . Anxiety   . Arthritis   . Chronic back  pain   . Complication of anesthesia    pt states, "i have migraine and high BP after surgery.".  Marland Kitchen Depression   . GERD (gastroesophageal reflux disease)   . Migraines   . Pancreatitis     Past Surgical History:  Procedure Laterality Date  . BILE DUCT STENT PLACEMENT  07/2011   Dr. Patrick North: fully covered metal wall stent placed.   . CHOLECYSTECTOMY  2010   with bile leak  . [facility]  . COLONOSCOPY WITH PROPOFOL N/A 03/02/2018   two 2-3 mm hyperplastic polyps, torturous left colon, mild diverticulosis in rectosigmoid, internal hemorrhoids, colonic biopsies negative  . ERCP  2011-2013   multiple, with multiple stent placements/exchanges. Metal wall stent placed in Aug 2012 AND STILL PRESENT AS OF 2019  . ESOPHAGOGASTRODUODENOSCOPY (EGD) WITH PROPOFOL N/A 03/02/2018   dysphagia due to benign-appearing esophageal stricture/GERD, s/p dilation, small hiatal hernia, gastritis.   Marland Kitchen SAVORY DILATION N/A 03/02/2018   Procedure: SAVORY DILATION;  Surgeon: Danie Binder, MD;  Location: [facility];  Service: Endoscopy;  Laterality: N/A;    Current Outpatient Medications  Medication Sig Dispense Refill  . ALPRAZolam (XANAX) 1 MG tablet Take 0.5-1 mg by mouth 2 (two) times daily as needed for anxiety or sleep.  0  . amphetamine-dextroamphetamine (ADDERALL) 20 MG tablet Take 20 mg by mouth every morning. As needed    . ibuprofen (ADVIL,MOTRIN) 800 MG tablet Take 800 mg by mouth every 8 (eight) hours as needed (for joint pain.).     Marland Kitchen Melatonin 5 MG CAPS Take by mouth at bedtime.    . traZODone (DESYREL) 50 MG tablet Take 50 mg by mouth at bedtime.    Marland Kitchen dexlansoprazole (DEXILANT) 60 MG capsule Take 1 capsule (60 mg total) by mouth daily. (Patient not taking: Reported on 01/05/2020) 90 capsule 3  . hydroxypropyl methylcellulose / hypromellose (ISOPTO TEARS / GONIOVISC) 2.5 % ophthalmic solution Place 1-2 drops into both eyes 3 (three) times daily as needed for dry  eyes.    Marland Kitchen lipase/protease/amylase (CREON) 36000 UNITS CPEP capsule Take 2 capsules (72,000 Units total) by mouth 3 (three) times daily with meals. 1 with snacks (Patient not taking: Reported on 01/05/2020) 240 capsule 11   No current facility-administered medications for this visit.    Allergies as of 01/05/2020 - Review Complete 01/05/2020  Allergen Reaction Noted  . Imitrex [sumatriptan] Anaphylaxis and Rash 06/15/2012  . Cymbalta [duloxetine hcl] Diarrhea, Nausea Only, and Other (See Comments) 02/12/2016  . Dicyclomine  03/02/2018  . Doxycycline Diarrhea 06/15/2012  . Gabapentin Other (See Comments) 02/17/2018  . Lyrica [pregabalin] Swelling and Other (See Comments) 06/15/2012  . Nucynta [tapentadol]  05/20/2015  . Nuvigil [armodafinil] Other (See Comments) 02/12/2016  . Other  06/06/2015  . Penicillins Other (See Comments) 02/12/2016  . Tape  07/24/2019  . Amoxicillin-pot clavulanate Diarrhea and Rash 06/15/2012  . Butrans [buprenorphine] Hives, Swelling, and Rash 06/15/2012  . Ciprofloxacin Diarrhea, Nausea And Vomiting, and Other (See Comments) 02/12/2016    Family History  Problem Relation Age of Onset  . Cervical cancer Mother   . Heart attack Other   . Migraines Neg Hx   . Seizures Neg Hx   .  Dementia Neg Hx   . Colon cancer Neg Hx     Social History   Socioeconomic History  . Marital status: Married    Spouse name: Ann [name]  . Number of children: 2  . Years of education: 52  . Highest education level: Not on file  Occupational History  . Occupation: Workers comp  Tobacco Use  . Smoking status: Former Smoker    Packs/day: 0.25    Years: 10.00    Pack years: 2.50    Types: Cigarettes    Quit date: 12/09/2011    Years since quitting: 8.0  . Smokeless tobacco: Never Used  Substance and Sexual Activity  . Alcohol use: No  . Drug use: No  . Sexual activity: Yes    Birth control/protection: Post-menopausal  Other Topics Concern  . Not on file  Social History  Narrative   Lives w/ husband   Caffeine use:  none   Social Determinants of Health   Financial Resource Strain:   . Difficulty of Paying Living Expenses: Not on file  Food Insecurity:   . Worried About Charity fundraiser in the Last Year: Not on file  . Ran Out of Food in the Last Year: Not on file  Transportation Needs:   . Lack of Transportation (Medical): Not on file  . Lack of Transportation (Non-Medical): Not on file  Physical Activity:   . Days of Exercise per Week: Not on file  . Minutes of Exercise per Session: Not on file  Stress:   . Feeling of Stress : Not on file  Social Connections:   . Frequency of Communication with Friends and Family: Not on file  . Frequency of Social Gatherings with Friends and Family: Not on file  . Attends Religious Services: Not on file  . Active Member of Clubs or Organizations: Not on file  . Attends Archivist Meetings: Not on file  . Marital Status: Not on file    Review of Systems: Gen: Denies fever, chills, anorexia. Denies fatigue, weakness, weight loss.  CV: Denies chest pain, palpitations, syncope, peripheral edema, and claudication. Resp: see HPI GI: see HPI Derm: Denies rash, itching, dry skin Psych: Denies depression, anxiety, memory loss, confusion. No homicidal or suicidal ideation.  Heme: Denies bruising, bleeding, and enlarged lymph nodes.  Physical Exam: BP 139/83   Pulse 67   Temp (!) 96.9 F (36.1 C) (Oral)   Ht 5' 6" (1.676 m)   Wt 135 lb 9.6 oz (61.5 kg)   LMP 05/16/2012   BMI 21.89 kg/m  General:   Alert and oriented. No distress noted. Pleasant and cooperative.  Head:  Normocephalic and atraumatic. Eyes:  Conjuctiva clear without scleral icterus. Lungs: clear bilaterally Cardiac: S1 S2 present without murmurs Abdomen:  +BS, soft, TTP epigastric and RUQ and non-distended. No rebound or guarding. No HSM or masses noted. Msk:  Symmetrical without gross deformities. Normal posture. Extremities:   Without edema. Neurologic:  Alert and  oriented x4 Psych:  Alert and cooperative. Normal mood and affect.  ASSESSMENT: Ann [name] is a 54 y.o. female presenting today with a complicated past medical history undergoing cholecystectomy at outside facility in 2010 with bile leak thereafter and undergoing multiple ERCPs with stent placement/exchanges per her report. Fully covered metal wall stent placed in Aug 2012 by Dr. Christoper Fabian, still remains. EUS at [facility] 2013 normal pancreas. 2012 normal bile duct brushing. Some question of chronic pancreatitis historically. Last seen here almost 2 years ago, now  returning with worsening abdominal pain and reported bump in transaminases and alk phos. I have requested those records. Associated pruritis. No jaundice appreciated today.  I suspect she has fluctuating LFTs due to likely stent occlusion, debris. I have requested labs from PCP stat. Will pursue CT abd/pelvis ASAP. Anticipate possible MRCP. She is declining evaluation at [facility] (where last seen), and also declining [facility] or [facility] due to transportation. Will review imaging with further recommendations to follow. Supportive measures in interim, including resuming pancreatic enzymes, PPI daily.    PLAN:    CT abd/pelvis with contrast ASAP  Obtain labs from PCP  Resume Creon 72,000 units with meals and 36,000 units with snacks  Nexium daily  Further recommendations to follow   Annitta Needs, PhD, ANP-BC [facility]

## 2020-01-05 NOTE — Telephone Encounter (Signed)
Attempted to submit PA for ASAP CT abd/pelvis w/contrast via [employer] for [place]. Case pending clinical review. Clinical notes need to be uploaded. Will upload notes when OV note is complete. AB informed.

## 2020-01-05 NOTE — Telephone Encounter (Signed)
Clinical notes uploaded and faxed to [employer].

## 2020-01-05 NOTE — Progress Notes (Signed)
Cc'ed to pcp °

## 2020-01-06 NOTE — Telephone Encounter (Signed)
PA pending per [employer] website. 

## 2020-01-09 NOTE — Telephone Encounter (Signed)
PA pending per [employer] website: Case Activity:Mbr Outreach in Process

## 2020-01-10 ENCOUNTER — Telehealth: Payer: Self-pay | Admitting: Gastroenterology

## 2020-01-10 NOTE — Telephone Encounter (Signed)
Pt requested CT 01/12/20 and to leave message on home answering machine with appt.  CT scheduled for 01/12/20 at 3:30pm, arrive at 3:15pm. Pick up contrast. NPO 4 hours prior to test.   LMOAM to inform pt.

## 2020-01-10 NOTE — Telephone Encounter (Signed)
Tried to call pt prior to scheduling CT, no answer, LMOVM for return call.

## 2020-01-10 NOTE — Telephone Encounter (Signed)
See other phone for CT.

## 2020-01-10 NOTE — Telephone Encounter (Signed)
Pt said we were suppose to be scheduling her a CT and she called her insurance last night and got the approval number. I told her the scheduler would be calling her back. Please advise. [phone]

## 2020-01-10 NOTE — Telephone Encounter (Signed)
CT approved. PA# [id number], valid 01/05/20-07/03/20.

## 2020-01-11 ENCOUNTER — Telehealth: Payer: Self-pay | Admitting: Gastroenterology

## 2020-01-11 NOTE — Telephone Encounter (Signed)
Received labs from 12/29/2019 by PCP:  Tbili 0.8, Alk Phos 215, AST 85, ALT 52. BUN 12, Creatinine 0.76. Hgb 12.6, Hct 38.3, WBC count 4.6, lipase 51.   Proceed with CT as planned.

## 2020-01-12 ENCOUNTER — Other Ambulatory Visit: Payer: Self-pay

## 2020-01-12 ENCOUNTER — Ambulatory Visit (HOSPITAL_COMMUNITY)
Admission: RE | Admit: 2020-01-12 | Discharge: 2020-01-12 | Disposition: A | Discharge status: Home / Self Care | Payer: Managed Care, Other (non HMO) | Source: Ambulatory Visit | Attending: Gastroenterology | Admitting: Gastroenterology

## 2020-01-12 ENCOUNTER — Telehealth: Payer: Self-pay

## 2020-01-12 DIAGNOSIS — R1013 Epigastric pain: Secondary | ICD-10-CM | POA: Insufficient documentation

## 2020-01-12 MED ORDER — IOHEXOL 300 MG/ML  SOLN
100.0000 mL | Freq: Once | INTRAMUSCULAR | Status: AC | PRN
  Administered 2020-01-12: 100 mL via INTRAVENOUS

## 2020-01-12 NOTE — Telephone Encounter (Signed)
I have started the PA for Creon.

## 2020-01-13 NOTE — Progress Notes (Signed)
CT images without visualization of the CBD stent. Mild intrahepatic biliary ductal dilation noted, slightly worsened from several years ago. No pancreatitis. How is her abdominal pain? We may need to pursue MRI/MRCP to further evaluate. Sent my Chart message.

## 2020-01-15 ENCOUNTER — Encounter (INDEPENDENT_AMBULATORY_CARE_PROVIDER_SITE_OTHER): Payer: Self-pay | Admitting: Rehabilitative and Restorative Service Providers"

## 2020-01-15 NOTE — Progress Notes (Signed)
Physical Therapy, [facility]  [address]  [place] [address]  [phone]  Physical Therapy Discharge Assessment        Patient Name: Monique [name]  Date of Birth: 18-Jul-1966      Reason for PT: L ITB syndrome/bursitis    Previous Therapy: Initial PT Evaluation and HEP    Total Number of Therapy Visits To Date: 1      Subjective & Objective:   Subjective  Pt was seen for a total of 1 visit which consisted of an initial evaluation and distrubition of a HEP. The plan was for the pt to receive formal PT treatment 2x a week to address her deficits, but she did not schedule any PT appointments when authorization was received from her insurance. Pt was not seen after her evaluation on 09/29/2019. Her current status is unknown.    Assessment:     Patient Goals: Not Met      Plan:     Discharge Summary: Pt is D/C from formal PT w/ a HEP.        Therapist:   Jerolyn Shin, PT, DPT  01/15/2020, 11:08     Start Time: 1100  End Time: 1100  Total Treatment Time: 0 minutes  Documentation only (not billed)

## 2020-01-16 ENCOUNTER — Telehealth: Payer: Self-pay | Admitting: Gastroenterology

## 2020-01-16 DIAGNOSIS — R7989 Other specified abnormal findings of blood chemistry: Secondary | ICD-10-CM

## 2020-01-16 DIAGNOSIS — Z8719 Personal history of other diseases of the digestive system: Secondary | ICD-10-CM

## 2020-01-16 DIAGNOSIS — R1013 Epigastric pain: Secondary | ICD-10-CM

## 2020-01-16 NOTE — Telephone Encounter (Signed)
[facility] clinical pool:  Please arrange an MRI/MRCP this week for patient. History of pancreatitis, elevated LFTs, biliary metal stent placed in 2012 due to strictured CBD, with worsening abdominal pain.  Alicia: let's have her repeat HFP today if she is able. Would like to trend LFTs.

## 2020-01-16 NOTE — Addendum Note (Signed)
Addended by: Corrie Mckusick on: 01/16/2020 08:22 AM   Modules accepted: Orders

## 2020-01-16 NOTE — Telephone Encounter (Signed)
Ann [name], can we provide Creon samples, 36,000 units for patient? Take 2 capsules with meals and 1 with snacks.

## 2020-01-16 NOTE — Telephone Encounter (Signed)
PA for MRI/MRCP submitted via [employer] website. Case went to clinical review. Service order: [id number]. Clinical notes uploaded to case.  Helmut Muster, please let pt know MRI will be scheduled when it has been approved by insurance.

## 2020-01-16 NOTE — Telephone Encounter (Signed)
Noted  

## 2020-01-16 NOTE — Telephone Encounter (Signed)
Lmom, Waiting on a return call.

## 2020-01-17 ENCOUNTER — Other Ambulatory Visit: Payer: Self-pay

## 2020-01-17 DIAGNOSIS — R7989 Other specified abnormal findings of blood chemistry: Secondary | ICD-10-CM

## 2020-01-17 NOTE — Telephone Encounter (Signed)
Spoke with pt. She is going to have labs done on Thursday when she's off work, AB is aware. Lab orders placed.

## 2020-01-17 NOTE — Telephone Encounter (Signed)
Ann [name], we do not have any at this time. I will be on the look out for when they come in.

## 2020-01-17 NOTE — Telephone Encounter (Signed)
PA pending per [employer] website. 

## 2020-01-18 ENCOUNTER — Encounter (INDEPENDENT_AMBULATORY_CARE_PROVIDER_SITE_OTHER): Payer: Self-pay | Admitting: Ophthalmology

## 2020-01-18 NOTE — Telephone Encounter (Signed)
PA pending per [employer] website. 

## 2020-01-18 NOTE — Telephone Encounter (Signed)
I sent a note to patient to inform. Please set aside some when they come in. Thanks!

## 2020-01-19 NOTE — Telephone Encounter (Signed)
Note on desk to Hold Creon samples for pt when we get some.

## 2020-01-19 NOTE — Telephone Encounter (Signed)
Ann [name], I have sent pt a message that I am leaving Creon samples at front for her #4 boxes.

## 2020-01-19 NOTE — Telephone Encounter (Signed)
Spoke to pt, she will call office back tomorrow to discuss scheduling.

## 2020-01-19 NOTE — Telephone Encounter (Signed)
MRI approved. PA# [id number], valid 01/16/20-07/14/20.  Tried to call pt to see when she would be able to do MRI, no answer, LMOVM for return call.

## 2020-01-19 NOTE — Telephone Encounter (Signed)
PA pending per [employer]. Mbr outreach in process.

## 2020-01-20 LAB — HEPATIC FUNCTION PANEL
ALT: 9 IU/L (ref 0–32)
AST: 17 IU/L (ref 0–40)
Albumin: 4.5 g/dL (ref 3.8–4.9)
Alkaline Phosphatase: 95 IU/L (ref 39–117)
Bilirubin Total: 0.5 mg/dL (ref 0.0–1.2)
Bilirubin, Direct: 0.11 mg/dL (ref 0.00–0.40)
Total Protein: 6.5 g/dL (ref 6.0–8.5)

## 2020-01-20 NOTE — Telephone Encounter (Signed)
Pt called office and LMOVM, requested MRI 01/26/20 since she will be off work.  No appts available for MRI 01/26/20. MRI scheduled next available 02/02/20 at 7:00am, arrive at 6:45am. NPO 4 hours prior to test.   Tried to call pt, no answer, LMOVM per her request and informed of MRI appt. Letter mailed.

## 2020-01-23 ENCOUNTER — Telehealth: Payer: Self-pay

## 2020-01-23 NOTE — Telephone Encounter (Signed)
I have added this info to the request.

## 2020-01-23 NOTE — Telephone Encounter (Signed)
Will it help if we state the pancreaze formulary does not provide appropriate dosing units for what she needs?

## 2020-01-23 NOTE — Telephone Encounter (Signed)
Ann [name], I am working on PA for Creon and they are asking her to try Pancreaze.  Please advise!

## 2020-01-24 ENCOUNTER — Encounter (INDEPENDENT_AMBULATORY_CARE_PROVIDER_SITE_OTHER): Payer: Self-pay | Admitting: Physical Medicine & Rehabilitation

## 2020-01-24 ENCOUNTER — Other Ambulatory Visit: Payer: Self-pay

## 2020-01-24 ENCOUNTER — Ambulatory Visit (INDEPENDENT_AMBULATORY_CARE_PROVIDER_SITE_OTHER): Payer: Commercial Managed Care - PPO | Admitting: Physical Medicine & Rehabilitation

## 2020-01-24 VITALS — BP 140/90 | HR 66 | Ht 66.0 in | Wt 214.0 lb

## 2020-01-24 DIAGNOSIS — M7062 Trochanteric bursitis, left hip: Secondary | ICD-10-CM

## 2020-01-24 DIAGNOSIS — R5383 Other fatigue: Secondary | ICD-10-CM

## 2020-01-24 DIAGNOSIS — M545 Low back pain: Secondary | ICD-10-CM

## 2020-01-24 DIAGNOSIS — M47816 Spondylosis without myelopathy or radiculopathy, lumbar region: Secondary | ICD-10-CM

## 2020-01-24 DIAGNOSIS — M5136 Other intervertebral disc degeneration, lumbar region: Secondary | ICD-10-CM

## 2020-01-24 DIAGNOSIS — Z79899 Other long term (current) drug therapy: Secondary | ICD-10-CM

## 2020-01-24 DIAGNOSIS — M5416 Radiculopathy, lumbar region: Secondary | ICD-10-CM

## 2020-01-24 DIAGNOSIS — G8929 Other chronic pain: Secondary | ICD-10-CM

## 2020-01-24 DIAGNOSIS — M5442 Lumbago with sciatica, left side: Secondary | ICD-10-CM

## 2020-01-24 NOTE — Nursing Note (Signed)
Review of Systems:  Constitutional: negative except for none  Eyes: negative except for contacts/glasses  Ears, nose, mouth, throat, and face: negative except for none  Respiratory: negative except for pneumonia  Cardiovascular: negative except for none  Gastrointestinal: negative except for reflux symptoms  Genitourinary:negative except for none  Integument/breast: negative except for none  Hematologic/lymphatic: negative except for easy bruising and bleeding  Musculoskeletal:negative except for stiff joints, neck pain, back pain and bone pain  Neurological: negative except for none  Behavioral/Psych: negative except for none  Endocrine: negative except for diabetes and thyroid disease

## 2020-01-24 NOTE — Nursing Note (Signed)
Per Dr. Rosana Hoes Order called in Gabapentin 100 mg, TID on a ramp up.  Take one capsule TID for one week, Take two capsules TID for two weeks, and take three capsule TID for the reminder of the month with no refills

## 2020-01-24 NOTE — Progress Notes (Signed)
PAIN MANAGEMENT, [facility]  [place]  [place] [address]  Dept: [phone]  Dept Fax: [phone]    Name:  Monique [name]  DOB:    03/01/1966   AGE:    54 y.o.    MRN:   [id number]      Date: 01/29/2020      Chief Complaint:  Chief Complaint   Patient presents with   . Low Back Pain     Pain rated 8 with OTC    . Hip Pain     Bilateral   . Shoulder Pain     Left Shoulder       HPI:  Patient presents for follow up  She was initially seen  and referred to Psychology for evaluation.   Since last visit she hurt her shoulder and was referred to Dr Joelene Millin.  It was diagnosed as degenerative arthritis and injections recommended.    She reports left hip pain which bothers her with prolonged walking.   She states that she was told she will need a hip replacement in the future on the left per Dr Joelene Millin.    Patient primary complaint is mid to  lower back pain - left worse than right.  It is constant and is described as  aching.  It radiates towards both hips but does not go further into extremities.  She denies bowel or bladder changes related to her back    Patient is currently using tylenol 500 tid -helps very little  She is requesting to resume hydrocodone that was prescribed by Dr Hortencia Pilar.          Pain and Function:     [place] Pain Rating Scale     On a scale of 0-10, during the past 24 hours, pain has interfered with you usual activity:   10  On a scale of 0-10, during the past 24 hours, pain has interfered with your sleep:   10  On a scale of 0-10, during the past 24 hours, pain has affected your mood:    10  On a scale of 0-10, during the past 24 hours, pain has contributed to your stress:   10  On a scale of 0-10, what is your overall pain Rating:   8        NOTE - patient appears comfortable throughout exam.  She is sitting with legs crossed  Without any  signs of discomfort          Vitals  Vitals:    01/24/20 1130   BP: (!) 140/90   Pulse: 66   SpO2: 97%   Weight: 97.1 kg (214 lb)      Height: 1.676 m (5' 6")   BMI: 34.61        REVIEW OF SYSTEMS AND CONTROLLED SUBSTANCE TRACKING  Nursing Notes:   Frederick Peers, LPN  36/64/40 [id number]  Cosign Needed  Review of Systems:  Constitutional: negative except for none  Eyes: negative except for contacts/glasses  Ears, nose, mouth, throat, and face: negative except for none  Respiratory: negative except for pneumonia  Cardiovascular: negative except for none  Gastrointestinal: negative except for reflux symptoms  Genitourinary:negative except for none  Integument/breast: negative except for none  Hematologic/lymphatic: negative except for easy bruising and bleeding  Musculoskeletal:negative except for stiff joints, neck pain, back pain and bone pain  Neurological: negative except for none  Behavioral/Psych: negative except for none  Endocrine: negative except for diabetes and thyroid disease  PAST MEDICAL/ FAMILY/ SOCIAL HISTORY:   Past Medical History:   Diagnosis Date   . Anxiety    . Carotid stenosis    . Chronic back pain    . Coronary artery disease involving native coronary artery of native heart    . DDD (degenerative disc disease), lumbar     "entire spine"   . Ear piercing    . GERD (gastroesophageal reflux disease)    . H/O complete eye exam     2 years, Dr. [name], at [facility]   . History of dental examination     dentures upper and lower   . HTN    . Hypercholesterolemia    . Hyperlipidemia    . MI (myocardial infarction) (CMS [place]) 2012   . Neck problem     herniated cervical disc   . Obesity (BMI 30-39.9)    . 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 [place] ordered.    . Stroke (CMS [facility])     2002   . Tattoo     Left breast, Right shoulder   . Thyroid disorder    . Thyroid nodule    . Uncontrolled type 2 diabetes mellitus, without long-term current use of insulin (CMS [place])    . Xanthelasma          Past Surgical History:   Procedure Laterality Date   . ENDOMETRIAL ABLATION  1998   .  HX ANKLE FRACTURE TX  1990s    Right, Doran Durand procedure   . HX CATARACT REMOVAL Bilateral 2013    r and l eyes with implants   . HX CESAREAN SECTION  06/20/2000    x3, 11/21/86, 11/10/84   . HX CHOLECYSTECTOMY  1988   . HX CORONARY ARTERY BYPASS GRAFT  03/31/2011    cardiac, 2 vessel, Dr. [name], [facility]   . HX CORONARY STENT PLACEMENT  2012    x4 stents   . HX GASTRIC SLEEVE  02/2017    [place], [place], Dr. Pearlie Oyster   . HX HEART CATHETERIZATION  2012    massive heart attack and stents - [facility]   . HX THYROID BIOPSY     . HX THYROIDECTOMY  101/01/2018   . HX TONSILLECTOMY      as child   . HX YAG Left 09/20/2013         Social History     Tobacco Use   . Smoking status: Current Every Day Smoker     Packs/day: 0.50     Years: 20.00     Pack years: 10.00     Types: Cigarettes     Start date: 34   . Smokeless tobacco: Never Used   Vaping Use   . Vaping Use: Never used   Substance Use Topics   . Alcohol use: No   . Drug use: No     Family Medical History:     Problem Relation (Age of Onset)    Atrial fibrillation Sister    Colon Cancer Father    Diabetes Mother, Maternal Aunt    Heart Disease Sister, Maternal Grandfather    Hypertension (High Blood Pressure) Mother    Lung Cancer Maternal Grandfather    MI <40 years of age Mother              MEDICATIONS:  Current Outpatient Medications   Medication Sig   . ACCU-CHEK AVIVA PLUS TEST STRP Strip USE 1  STRIP TO TEST BLOOD SUGAR THREE TIMES A DAY   . Blood-Glucose Meter (ACCU-CHEK AVIVA PLUS METER) Misc 1 Kit by Does not apply route Three times a day   . clopidogreL (PLAVIX) 75 mg Oral Tablet Take 1 Tab (75 mg total) by mouth Once a day   . cyanocobalamin (VITAMIN B12) 1,000 mcg/mL Injection Solution 1 mL (1,000 mcg total) by Subcutaneous route Every 30 days   . diclofenac sodium (VOLTAREN) 1 % Gel 2 g by Apply Topically route Three times a day as needed   . docusate sodium (COLACE) 100 mg Oral Capsule Take 1 Cap (100 mg total) by mouth Twice daily   .  empagliflozin-linagliptin (GLYXAMBI) 10-5 mg Oral Tablet TAKE 1 TABLET BY MOUTH ONCE DAILY   . ergocalciferol, vitamin D2, (DRISDOL) 1,250 mcg (50,000 unit) Oral Capsule TAKE 1 CAPSULE BY MOUTH EVERY WEEK   . exenatide microspheres ER (BYDUREON) 2 mg Subcutaneous 2 mg by Subcutaneous route Every 7 days   . furosemide (LASIX) 40 mg Oral Tablet TAKE 1 TABLET(40 MG) BY MOUTH EVERY DAY   . Ibuprofen (MOTRIN) 800 mg Oral Tablet Take 1 Tab (800 mg total) by mouth Three times a day as needed for Pain   . levothyroxine (SYNTHROID) 25 mcg Oral Tablet Take 1 Tab (25 mcg total) by mouth Once a day   . lisinopriL (PRINIVIL) 2.5 mg Oral Tablet TAKE 1 TABLET BY MOUTH EVERY DAY   . metFORMIN (GLUCOPHAGE) 500 mg Oral Tablet take 1 tablet by mouth twice a day with food   . methocarbamoL (ROBAXIN) 500 mg Oral Tablet Take 1 Tab (500 mg total) by mouth Three times a day as needed (muscle spasms)   . metoprolol succinate (TOPROL-XL) 25 mg Oral Tablet Sustained Release 24 hr Take 0.5 Tabs (12.5 mg total) by mouth Once a day   . NARCAN 4 mg/actuation Nasal Spray, Non-Aerosol PT STATED SHE HAS NARCAN AT HOME BUT IS NOT TAKING IT   . niacin (NIASPAN) 500 mg Oral Tablet Sustained Release Take 1 Tab (500 mg total) by mouth Once a day   . nitroGLYCERIN (NITROSTAT) 0.4 mg Sublingual Tablet, Sublingual 1 Tab (0.4 mg total) by Sublingual route Every 5 minutes as needed for Chest pain for 3 doses over 15 minutes   . omeprazole (PRILOSEC) 40 mg Oral Capsule, Delayed Release(E.C.) Take 1 Cap (40 mg total) by mouth Once a day   . ondansetron (ZOFRAN ODT) 8 mg Oral Tablet, Rapid Dissolve DISSOLVE 1 TABLET(8 MG) ON THE TONGUE EVERY 8 HOURS AS NEEDED FOR NAUSEA OR VOMITING   . PRENATAL PLUS, CALCIUM CARB, 27 mg iron- 1 mg Oral Tablet Take 1 Tab by mouth Once a day   . PROVENTIL HFA 90 mcg/actuation Inhalation HFA Aerosol Inhaler Take 1-2 Puffs by inhalation Every 4 hours as needed   . rosuvastatin (CRESTOR) 20 mg Oral Tablet TAKE 1 TABLET BY MOUTH ONCE  DAILY   . venlafaxine (EFFEXOR XR) 37.5 mg Oral Capsule, Sust. Release 24 hr Take 1 Cap (37.5 mg total) by mouth Once a day   . VENTOLIN HFA 90 mcg/actuation Inhalation HFA Aerosol Inhaler Take 1-2 Puffs by inhalation Every 6 hours as needed   . ZYRTEC-D 5-120 mg Oral Tablet Sustained Release 12 hr Take 1 Tab by mouth Once a day       ALLERGIES:  Allergies   Allergen Reactions   . Ane Payment [Pyrilamine-Dextromethorphan]  Other Adverse Reaction (Add comment)     LIGHT HEADED   . Tylenol  W Codeine [Acetaminophen-Codeine]  Other Adverse Reaction (Add comment)     pt states that one time she had chest pains with this med, but dr. thought it was because she had not eaten       VITALS:     Vitals:    01/24/20 1130   BP: (!) 140/90   Pulse: 66   SpO2: 97%   Weight: 97.1 kg (214 lb)   Height: 1.676 m (5' 6")   BMI: 34.61        PHYSICAL EXAMINATION:   Physical Exam  Vitals and nursing note reviewed.   HENT:      Head: Normocephalic and atraumatic.   Eyes:      General: No scleral icterus.  Pulmonary:      Effort: Pulmonary effort is normal.   Musculoskeletal:      Comments: Minimal tenderness reported on palpation of the cervical paraspinals.  C-spine range of motion is functional.  Left shoulder range of motion moderately restricted and painful.  Other upper extremity range of motion is within normal limits.  Patient reports mild tenderness mid Thoracics on palpation.  Moderate tenderness reported lower thoracic and throughout the lumbar region bilateral-left worse than right.  Lumbar spine range of motion is within normal limits.  She does report some discomfort in the extremes of extension.  Symptom magnification noted as she reports significant pain with very light touch.  Tenderness noted on palpation over the bilateral trochanteric bursa.  Straight-leg-raising negative bilateral  Lower extremity range of motion is mildly limited and painful in the extremes of internal rotation both hips.  Other  lower extremity range  of motion is within normal limits  Patient appears to have some discomfort during transitional movements from sit to stand.  Her gait is normal heel-toe pattern without assistive device   Skin:     General: Skin is warm and dry.   Neurological:      Mental Status: She is alert.      Comments: Patient is alert and oriented.  Speech is clear and fluent.  Upper extremity sensation is reported as normal to light touch throughout.  Upper extremity strength limited in left shoulder due to pain.  Other upper extremity strength is 5/5.  Upper extremity reflexes normal at 2/4  Will.  Lower extremity strength generally 5/5 without focal deficits.  Reflexes are 1/4 quads and absent in both Achilles.   Psychiatric:         Mood and Affect: Mood normal.         Behavior: Behavior normal.          ASSESSMENT AND PLAN:     ICD-10-CM    1. Lumbar radiculopathy  M54.16 COMPREHENSIVE METABOLIC PANEL, NON-FASTING     CBC     THYROID STIMULATING HORMONE (SENSITIVE TSH)   2. Chronic bilateral low back pain without sciatica  M54.5 COMPREHENSIVE METABOLIC PANEL, NON-FASTING    G89.29 CBC     THYROID STIMULATING HORMONE (SENSITIVE TSH)   3. Fatigue, unspecified type  R53.83 COMPREHENSIVE METABOLIC PANEL, NON-FASTING     CBC     THYROID STIMULATING HORMONE (SENSITIVE TSH)   4. Encounter for medication management  Z79.899    5. Chronic bilateral low back pain with left-sided sciatica  M54.42     G89.29    6. Facet arthritis of lumbar region  M47.816    7. Trochanteric bursitis of left hip  M70.62    8. DDD (degenerative disc disease), lumbar  M51.36  DATA REVIEWED TODAY    XRAY LUMBAR 08/31/2019  IMPRESSION:  1. Osteopenia but no definite acute fracture or destructive bony process.  2. Moderate facet arthritis in the lower lumbar spine which is grossly  stable.  3. Mild degenerative disc disease most pronounced near the thoracolumbar  junction. The lumbar disks are relatively well-preserved.  4. Extensive vascular calcification is  noted in the aorta and iliac  vessels.    LABS STILL NEED UPDATED  GFR 47 2019  psychology evaluation reviewed from 11/28/2019-        Plan    Contract and consent to treat reviewed and signed today  Discussed and will start a Neurontin ramp  up beginning with 100 HS.    Labs will require updating with special attention given to renal function to determine dose of Neurontin that will be use in the future.  May consider adding Cymbalta.    Consider injection for trochanteric bursitis if symptoms persist    Patient again asked why she cannot have the hydrocodone that she was on previously and I explained that I am not recommending opioid management.      FOLLOW UP:   Return in about 4 weeks (around 02/21/2020).     Lorra Hals, DO

## 2020-01-25 ENCOUNTER — Other Ambulatory Visit (HOSPITAL_BASED_OUTPATIENT_CLINIC_OR_DEPARTMENT_OTHER): Payer: Self-pay | Admitting: Family Medicine

## 2020-01-25 DIAGNOSIS — E538 Deficiency of other specified B group vitamins: Secondary | ICD-10-CM

## 2020-01-25 MED ORDER — CYANOCOBALAMIN (VIT B-12) 1,000 MCG/ML INJECTION SOLUTION
1000.00 ug | INTRAMUSCULAR | 3 refills | Status: DC
Start: 2020-01-25 — End: 2021-02-14

## 2020-01-25 NOTE — Telephone Encounter (Signed)
Regarding: Dr Russ Halo  ----- Message from Laurance Flatten sent at 01/25/2020 10:18 AM EST -----  Claims she picked her last one up at the [facility] yesterday and is out. She is needing some called in    #cyanocobalamin (VITAMIN B12) 1,000 mcg/mL Injection Solution, 1 mL (1,000 mcg total) by Subcutaneous route Every 30 days    Preferred [facility], [place] - [address]    [address] [place] [address]    Phone: [phone] Fax: [phone]    Hours: Not open 24 hours

## 2020-01-29 ENCOUNTER — Encounter (INDEPENDENT_AMBULATORY_CARE_PROVIDER_SITE_OTHER): Payer: Self-pay | Admitting: Physical Medicine & Rehabilitation

## 2020-01-30 ENCOUNTER — Other Ambulatory Visit: Payer: Self-pay

## 2020-01-30 ENCOUNTER — Ambulatory Visit (HOSPITAL_BASED_OUTPATIENT_CLINIC_OR_DEPARTMENT_OTHER): Payer: Self-pay | Admitting: Family Medicine

## 2020-01-30 ENCOUNTER — Ambulatory Visit: Payer: Commercial Managed Care - PPO | Attending: Physical Medicine & Rehabilitation

## 2020-01-30 DIAGNOSIS — R5383 Other fatigue: Secondary | ICD-10-CM

## 2020-01-30 DIAGNOSIS — M5416 Radiculopathy, lumbar region: Secondary | ICD-10-CM

## 2020-01-30 DIAGNOSIS — M79605 Pain in left leg: Secondary | ICD-10-CM | POA: Insufficient documentation

## 2020-01-30 DIAGNOSIS — G8929 Other chronic pain: Secondary | ICD-10-CM | POA: Insufficient documentation

## 2020-01-30 DIAGNOSIS — M545 Low back pain: Secondary | ICD-10-CM | POA: Insufficient documentation

## 2020-01-30 DIAGNOSIS — R52 Pain, unspecified: Secondary | ICD-10-CM

## 2020-01-30 DIAGNOSIS — M79604 Pain in right leg: Secondary | ICD-10-CM

## 2020-01-30 LAB — CBC
HCT: 43.9 % (ref 34.8–46.0)
HGB: 14.2 g/dL (ref 11.5–16.0)
MCH: 29.3 pg (ref 26.0–32.0)
MCHC: 32.3 g/dL (ref 31.0–35.5)
MCV: 90.7 fL (ref 78.0–100.0)
MPV: 10.2 fL (ref 8.7–12.5)
PLATELETS: 382 10*3/uL (ref 150–400)
RBC: 4.84 10*6/uL (ref 3.85–5.22)
RDW-CV: 13.4 % (ref 11.5–15.5)
WBC: 10.8 10*3/uL (ref 3.7–11.0)

## 2020-01-30 LAB — COMPREHENSIVE METABOLIC PANEL, NON-FASTING
ALBUMIN: 3.4 g/dL — ABNORMAL LOW (ref 3.5–5.0)
ALKALINE PHOSPHATASE: 102 U/L (ref 50–130)
ALT (SGPT): 15 U/L (ref 8–22)
ANION GAP: 10 mmol/L (ref 4–13)
AST (SGOT): 21 U/L (ref 8–45)
BILIRUBIN TOTAL: 0.4 mg/dL (ref 0.3–1.3)
BUN/CREA RATIO: 11 (ref 6–22)
BUN: 8 mg/dL (ref 8–25)
CALCIUM: 8.8 mg/dL (ref 8.5–10.0)
CHLORIDE: 107 mmol/L (ref 96–111)
CO2 TOTAL: 24 mmol/L (ref 22–30)
CREATININE: 0.75 mg/dL (ref 0.60–1.05)
ESTIMATED GFR: 90 mL/min/BSA (ref >=60)
GLUCOSE: 202 mg/dL — ABNORMAL HIGH (ref 65–125)
POTASSIUM: 4.2 mmol/L (ref 3.5–5.1)
PROTEIN TOTAL: 6.2 g/dL — ABNORMAL LOW (ref 6.4–8.3)
SODIUM: 141 mmol/L (ref 136–145)

## 2020-01-30 LAB — THYROID STIMULATING HORMONE (SENSITIVE TSH): TSH: 2.595 u[IU]/mL (ref 0.430–3.550)

## 2020-01-30 LAB — MAGNESIUM: MAGNESIUM: 1.6 mg/dL — ABNORMAL LOW (ref 1.8–2.6)

## 2020-01-30 NOTE — Telephone Encounter (Signed)
[employer] called office and LMOVM requesting phone number for pt.  Called Rosann Auerbach [phone]), spoke to [facility] with member outreach team, gave her phone numbers for pt.

## 2020-01-30 NOTE — Telephone Encounter (Signed)
Referral placed to for [facility], per pt request.   Ranae Pila, RN

## 2020-01-30 NOTE — Telephone Encounter (Signed)
Regarding: Dr. Russ Halo  ----- Message from Myna Hidalgo sent at 01/30/2020 12:01 PM EST -----  Patient is wanting to know if she can have a referral to [facility], Phone [phone].   Patient states that she is currently going to Lorra Hals and she does not seem to be helping.

## 2020-02-02 ENCOUNTER — Ambulatory Visit (HOSPITAL_BASED_OUTPATIENT_CLINIC_OR_DEPARTMENT_OTHER): Payer: Self-pay | Admitting: Family Medicine

## 2020-02-02 ENCOUNTER — Ambulatory Visit (HOSPITAL_COMMUNITY)
Admission: RE | Admit: 2020-02-02 | Discharge: 2020-02-02 | Disposition: A | Discharge status: Home / Self Care | Payer: Managed Care, Other (non HMO) | Source: Ambulatory Visit | Attending: Gastroenterology | Admitting: Gastroenterology

## 2020-02-02 ENCOUNTER — Other Ambulatory Visit: Payer: Self-pay | Admitting: Gastroenterology

## 2020-02-02 ENCOUNTER — Other Ambulatory Visit: Payer: Self-pay

## 2020-02-02 DIAGNOSIS — R1013 Epigastric pain: Secondary | ICD-10-CM | POA: Insufficient documentation

## 2020-02-02 DIAGNOSIS — R7989 Other specified abnormal findings of blood chemistry: Secondary | ICD-10-CM | POA: Diagnosis present

## 2020-02-02 DIAGNOSIS — Z8719 Personal history of other diseases of the digestive system: Secondary | ICD-10-CM

## 2020-02-02 MED ORDER — GADOBUTROL 1 MMOL/ML IV SOLN
7.0000 mL | Freq: Once | INTRAVENOUS | Status: AC | PRN
  Administered 2020-02-02: 7 mL via INTRAVENOUS

## 2020-02-02 NOTE — Telephone Encounter (Signed)
Spoke with pt and lab results given \.  Ranae Pila, RN

## 2020-02-02 NOTE — Telephone Encounter (Signed)
Regarding: Dr Russ Halo  ----- Message from Laurance Flatten sent at 02/02/2020 10:22 AM EST -----  Missed a call about results. Wants a nurse call back

## 2020-02-02 NOTE — Progress Notes (Signed)
Please let patient know that her labs are back.  Her magnesium is slightly low I recommend that she add 300 mg of magnesium to her diet a day.  This can be found over-the-counter or a prescription can be called in (if she is not already on magnesium).  Her chemistry panel showed good kidney and renal function.  Her sugar was slightly elevated but normal for her.  Will review these together at our next appointment and repeat the magnesium lab.  TY TH

## 2020-02-08 ENCOUNTER — Encounter (INDEPENDENT_AMBULATORY_CARE_PROVIDER_SITE_OTHER): Payer: Self-pay | Admitting: Ophthalmology

## 2020-02-08 ENCOUNTER — Other Ambulatory Visit: Payer: Self-pay | Admitting: *Deleted

## 2020-02-08 DIAGNOSIS — R7989 Other specified abnormal findings of blood chemistry: Secondary | ICD-10-CM

## 2020-02-15 ENCOUNTER — Telehealth: Payer: Self-pay | Admitting: Gastroenterology

## 2020-02-15 NOTE — Telephone Encounter (Signed)
What is status of tertiary care referral for ERCP? Needs to be seen expeditiously.

## 2020-02-15 NOTE — Telephone Encounter (Signed)
Referral was faxed 3/3. Called to f/u and was advised they did not receive it. It has been re faxed asap ASAP.

## 2020-02-17 ENCOUNTER — Other Ambulatory Visit: Payer: Self-pay | Admitting: Gastroenterology

## 2020-02-17 MED ORDER — ESOMEPRAZOLE MAGNESIUM 40 MG PO CPDR: 40.0000 mg | DELAYED_RELEASE_CAPSULE | Freq: Every day | ORAL | 3 refills | Status: DC

## 2020-02-20 ENCOUNTER — Ambulatory Visit (HOSPITAL_BASED_OUTPATIENT_CLINIC_OR_DEPARTMENT_OTHER): Payer: Self-pay | Admitting: Family Medicine

## 2020-02-20 NOTE — Telephone Encounter (Signed)
Can you help her with this info? I don't know where to tell her or what is available. Thanks!

## 2020-02-20 NOTE — Telephone Encounter (Signed)
Spoke with pt and she states that even with a PA the [employer] script is is still $700.00. Pt is now only on her metformin. She wants to know what the other options are. She states the [facility] is too expensive as well. Please advise.Ranae Pila, RN

## 2020-02-21 ENCOUNTER — Encounter (INDEPENDENT_AMBULATORY_CARE_PROVIDER_SITE_OTHER): Payer: Self-pay | Admitting: Physical Medicine & Rehabilitation

## 2020-02-23 ENCOUNTER — Ambulatory Visit (HOSPITAL_BASED_OUTPATIENT_CLINIC_OR_DEPARTMENT_OTHER): Payer: Self-pay | Admitting: Family Medicine

## 2020-02-23 NOTE — Telephone Encounter (Signed)
Received notification that [employer] has been denied again, they want an appeal letter sent to: [employer] unit [employer] [address] [place], [place] [address]

## 2020-02-24 ENCOUNTER — Ambulatory Visit (HOSPITAL_BASED_OUTPATIENT_CLINIC_OR_DEPARTMENT_OTHER): Payer: Self-pay | Admitting: Family Medicine

## 2020-02-24 NOTE — Telephone Encounter (Signed)
Spoke with patient and advised her that I had tried to call her pharmacy and [employer] to try to get prices on some different diabetic med options. [employer] stated that they could only talk with the pt and the pharmacy could not run anything without a script. I advised pt to try to call her insurance and get a preferred drug list for diabetic meds. I advised her that I would touch base with her again Monday. She verbalized understanding of this.  Ranae Pila, RN

## 2020-02-28 ENCOUNTER — Other Ambulatory Visit (INDEPENDENT_AMBULATORY_CARE_PROVIDER_SITE_OTHER): Payer: Self-pay | Admitting: Physical Medicine & Rehabilitation

## 2020-02-28 ENCOUNTER — Ambulatory Visit (HOSPITAL_BASED_OUTPATIENT_CLINIC_OR_DEPARTMENT_OTHER): Payer: Self-pay | Admitting: Family Medicine

## 2020-02-28 ENCOUNTER — Ambulatory Visit (INDEPENDENT_AMBULATORY_CARE_PROVIDER_SITE_OTHER): Payer: Commercial Managed Care - PPO | Admitting: Physical Medicine & Rehabilitation

## 2020-02-28 ENCOUNTER — Other Ambulatory Visit: Payer: Self-pay

## 2020-02-28 ENCOUNTER — Encounter (INDEPENDENT_AMBULATORY_CARE_PROVIDER_SITE_OTHER): Payer: Self-pay | Admitting: Physical Medicine & Rehabilitation

## 2020-02-28 VITALS — BP 118/74 | HR 71 | Ht 66.0 in | Wt 214.0 lb

## 2020-02-28 DIAGNOSIS — M545 Low back pain, unspecified: Secondary | ICD-10-CM

## 2020-02-28 DIAGNOSIS — M5416 Radiculopathy, lumbar region: Secondary | ICD-10-CM

## 2020-02-28 DIAGNOSIS — M79604 Pain in right leg: Secondary | ICD-10-CM

## 2020-02-28 DIAGNOSIS — Z79899 Other long term (current) drug therapy: Secondary | ICD-10-CM

## 2020-02-28 DIAGNOSIS — M79605 Pain in left leg: Secondary | ICD-10-CM

## 2020-02-28 DIAGNOSIS — G8929 Other chronic pain: Secondary | ICD-10-CM

## 2020-02-28 MED ORDER — GABAPENTIN 100 MG CAPSULE
300.00 mg | ORAL_CAPSULE | Freq: Three times a day (TID) | ORAL | 0 refills | Status: DC
Start: 2020-02-28 — End: 2020-07-04

## 2020-02-28 NOTE — Telephone Encounter (Signed)
Left message for pt to call office to discuss diabetic medications. This is a f/u call from last week to further discuss and to see if pt was able to get a hold of [employer] to see which meds were covered.   Ranae Pila, RN

## 2020-02-28 NOTE — Progress Notes (Signed)
PAIN MANAGEMENT, [facility]  [address]  [place] [address]  Dept: [phone]  Dept Fax: [phone]    Name:  Monique [name]  DOB:    Apr 07, 1966   AGE:    54 y.o.    MRN:   [id number]      Date: 03/10/2020      Chief Complaint:  Chief Complaint   Patient presents with   . Low Back Pain     Pain rated 8 with medications   . Hip Pain   . Shoulder Pain     Left shoulder       HPI:  Patient presents for follow up  She reports constant low back pain which is worse with prolonged standing and walking.  Bending and twisting makes it worse.  Heat and gabapentin helps.    She has left shoulder pain which is worse with overhead or lifting.  She has seen Dr Joelene Millin and is deferring injections due to blood sugars    She has persistent left hip pain worse with weight bearing and better with rest.  Following with Dr   Joelene Millin.        Pain and Function:     [place] Pain Rating Scale     On a scale of 0-10, during the past 24 hours, pain has interfered with you usual activity: 8    On a scale of 0-10, during the past 24 hours, pain has interfered with your sleep: 8-10    On a scale of 0-10, during the past 24 hours, pain has affected your mood:  8  On a scale of 0-10, during the past 24 hours, pain has contributed to your stress: 8    On a scale of 0-10, what is your overall pain Rating: 8-10        Vitals  Vitals:    02/28/20 0923   BP: 118/74   Pulse: 71   SpO2: 95%   Weight: 97.1 kg (214 lb)   Height: 1.676 m (5' 6" )   BMI: 34.61        REVIEW OF SYSTEMS AND CONTROLLED SUBSTANCE TRACKING  Nursing Notes:   Frederick Peers, LPN  51/70/01 [id number]  Signed  Review of Systems:  Constitutional: negative except fornone  Eyes: negative except forcontacts/glasses  Ears, nose, mouth, throat, and face: negative except fornone  Respiratory: negative except forpneumonia  Cardiovascular: negative except fornone  Gastrointestinal: negative except forreflux symptoms  Genitourinary:negative except fornone  Integument/breast:  negative except fornone  Hematologic/lymphatic: negative except foreasy bruising and bleeding  Musculoskeletal:negative except forstiff joints, neck pain, back pain and bone pain  Neurological: negative except fornone  Behavioral/Psych: negative except fornone  Endocrine: negative except fordiabetes and thyroid disease           PAST MEDICAL/ FAMILY/ SOCIAL HISTORY:   Past Medical History:   Diagnosis Date   . Anxiety    . Carotid stenosis    . Chronic back pain    . Coronary artery disease involving native coronary artery of native heart    . DDD (degenerative disc disease), lumbar     "entire spine"   . Ear piercing    . GERD (gastroesophageal reflux disease)    . H/O complete eye exam     2 years, Dr. [name], at [facility]   . History of dental examination     dentures upper and lower   . HTN    . Hypercholesterolemia    . Hyperlipidemia    .  MI (myocardial infarction) (CMS [place]) 2012   . Neck problem     herniated cervical disc   . Obesity (BMI 30-39.9)    . 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 [place] ordered.    . Stroke (CMS [facility])     2002   . Tattoo     Left breast, Right shoulder   . Thyroid disorder    . Thyroid nodule    . Uncontrolled type 2 diabetes mellitus, without long-term current use of insulin (CMS [place])    . Xanthelasma          Past Surgical History:   Procedure Laterality Date   . ENDOMETRIAL ABLATION  1998   . HX ANKLE FRACTURE [place]  1990s    Right, Doran Durand procedure   . HX CATARACT REMOVAL Bilateral 2013    r and l eyes with implants   . HX CESAREAN SECTION  06/20/2000    x3, 11/21/86, 11/10/84   . HX CHOLECYSTECTOMY  1988   . HX CORONARY ARTERY BYPASS GRAFT  03/31/2011    cardiac, 2 vessel, Dr. [name], [facility]   . HX CORONARY STENT PLACEMENT  2012    x4 stents   . HX GASTRIC SLEEVE  02/2017    [place], [place], Dr. Pearlie Oyster   . HX HEART CATHETERIZATION  2012    massive heart attack and stents - [facility]   . HX THYROID BIOPSY        . HX THYROIDECTOMY  101/01/2018   . HX TONSILLECTOMY      as child   . HX YAG Left 09/20/2013         Social History     Tobacco Use   . Smoking status: Current Every Day Smoker     Packs/day: 0.50     Years: 20.00     Pack years: 10.00     Types: Cigarettes     Start date: 68   . Smokeless tobacco: Never Used   Vaping Use   . Vaping Use: Never used   Substance Use Topics   . Alcohol use: No   . Drug use: No     Family Medical History:     Problem Relation (Age of Onset)    Atrial fibrillation Sister    Colon Cancer Father    Diabetes Mother, Maternal Aunt    Heart Disease Sister, Maternal Grandfather    Hypertension (High Blood Pressure) Mother    Lung Cancer Maternal Grandfather    MI <88 years of age Mother              MEDICATIONS:  Current Outpatient Medications   Medication Sig   . ACCU-CHEK AVIVA PLUS TEST STRP Strip USE 1 STRIP TO TEST BLOOD SUGAR THREE TIMES A DAY   . Blood-Glucose Meter (ACCU-CHEK AVIVA PLUS METER) Misc 1 Kit by Does not apply route Three times a day   . clopidogreL (PLAVIX) 75 mg Oral Tablet Take 1 Tab (75 mg total) by mouth Once a day   . cyanocobalamin (VITAMIN B12) 1,000 mcg/mL Injection Solution 1 mL (1,000 mcg total) by Subcutaneous route Every 30 days   . diclofenac sodium (VOLTAREN) 1 % Gel 2 g by Apply Topically route Three times a day as needed   . docusate sodium (COLACE) 100 mg Oral Capsule Take 1 Cap (100 mg total) by mouth Twice daily   . dulaglutide (TRULICITY) 8.08 UP/1.0 mL Subcutaneous Pen  Injector 0.5 mL (0.75 mg total) by Subcutaneous route Every 7 days   . empagliflozin-linagliptin (GLYXAMBI) 10-5 mg Oral Tablet TAKE 1 TABLET BY MOUTH ONCE DAILY   . ergocalciferol, vitamin D2, (DRISDOL) 1,250 mcg (50,000 unit) Oral Capsule TAKE 1 CAPSULE BY MOUTH EVERY WEEK   . exenatide microspheres ER (BYDUREON) 2 mg Subcutaneous 2 mg by Subcutaneous route Every 7 days   . furosemide (LASIX) 40 mg Oral Tablet TAKE 1 TABLET(40 MG) BY MOUTH EVERY DAY   . gabapentin (NEURONTIN) 100  mg Oral Capsule Take 3 Capsules (300 mg total) by mouth Three times a day for 30 days   . Ibuprofen (MOTRIN) 800 mg Oral Tablet Take 1 Tab (800 mg total) by mouth Three times a day as needed for Pain   . levothyroxine (SYNTHROID) 25 mcg Oral Tablet Take 1 Tab (25 mcg total) by mouth Once a day   . lisinopriL (PRINIVIL) 2.5 mg Oral Tablet TAKE 1 TABLET BY MOUTH EVERY DAY   . metFORMIN (GLUCOPHAGE) 500 mg Oral Tablet take 1 tablet by mouth twice a day with food   . methocarbamoL (ROBAXIN) 500 mg Oral Tablet Take 1 Tab (500 mg total) by mouth Three times a day as needed (muscle spasms)   . metoprolol succinate (TOPROL-XL) 25 mg Oral Tablet Sustained Release 24 hr Take 0.5 Tabs (12.5 mg total) by mouth Once a day   . NARCAN 4 mg/actuation Nasal Spray, Non-Aerosol PT STATED SHE HAS NARCAN AT HOME BUT IS NOT TAKING IT   . niacin (NIASPAN) 500 mg Oral Tablet Sustained Release Take 1 Tab (500 mg total) by mouth Once a day   . nitroGLYCERIN (NITROSTAT) 0.4 mg Sublingual Tablet, Sublingual 1 Tab (0.4 mg total) by Sublingual route Every 5 minutes as needed for Chest pain for 3 doses over 15 minutes   . omeprazole (PRILOSEC) 40 mg Oral Capsule, Delayed Release(E.C.) TAKE 1 CAPSULE BY MOUTH ONCE DAILY   . ondansetron (ZOFRAN ODT) 8 mg Oral Tablet, Rapid Dissolve DISSOLVE 1 TABLET(8 MG) ON THE TONGUE EVERY 8 HOURS AS NEEDED FOR NAUSEA OR VOMITING   . PRENATAL PLUS, CALCIUM CARB, 27 mg iron- 1 mg Oral Tablet Take 1 Tab by mouth Once a day   . PROVENTIL HFA 90 mcg/actuation Inhalation HFA Aerosol Inhaler Take 1-2 Puffs by inhalation Every 4 hours as needed   . rosuvastatin (CRESTOR) 20 mg Oral Tablet TAKE 1 TABLET BY MOUTH ONCE DAILY   . venlafaxine (EFFEXOR XR) 37.5 mg Oral Capsule, Sust. Release 24 hr TAKE 1 CAPSULE BY MOUTH ONCE DAILY   . VENTOLIN HFA 90 mcg/actuation Inhalation HFA Aerosol Inhaler Take 1-2 Puffs by inhalation Every 6 hours as needed   . ZYRTEC-D 5-120 mg Oral Tablet Sustained Release 12 hr Take 1 Tab by mouth  Once a day       ALLERGIES:  Allergies   Allergen Reactions   . Ane Payment [Pyrilamine-Dextromethorphan]  Other Adverse Reaction (Add comment)     LIGHT HEADED   . Tylenol W Codeine [Acetaminophen-Codeine]  Other Adverse Reaction (Add comment)     pt states that one time she had chest pains with this med, but dr. thought it was because she had not eaten       VITALS:     Vitals:    02/28/20 0923   BP: 118/74   Pulse: 71   SpO2: 95%   Weight: 97.1 kg (214 lb)   Height: 1.676 m (5' 6" )   BMI: 34.61  PHYSICAL EXAMINATION:   Physical Exam  Vitals and nursing note reviewed.   HENT:      Head: Normocephalic and atraumatic.   Eyes:      General: No scleral icterus.  Pulmonary:      Effort: Pulmonary effort is normal.   Musculoskeletal:      Comments: Moderate tenderness cervical and upper trap region on the left.  C-spine range of motion is normal.  Left shoulder ranges moderately restricted and painful.  Other upper extremity range of motion is within normal limits.  Bilateral lumbosacral paraspinal tenderness  Lower extremity range of motion is within normal limits.  Gait is normal   Skin:     General: Skin is warm and dry.   Neurological:      Mental Status: She is alert.      Comments: Patient is alert and oriented.  Speech is clear and fluent.  Upper extremity sensation is reported as normal to light touch.  Upper extremity strength is 5/5.  Lower extremity sensation is reported as normal to light touch  Lower extremity strength is 5/5.  Straight-leg-raising negative.   Psychiatric:         Mood and Affect: Mood normal.         Behavior: Behavior normal.          ASSESSMENT AND PLAN:     ICD-10-CM    1. Encounter for medication management  Z79.899    2. Chronic bilateral low back pain without sciatica  M54.5     G89.29    3. Lumbar radiculopathy  M54.16          Improvement with gabapentin reported.  She has some intolerance with mild nausea we discussed changing the timing of the medication and taking it with  food.  She is also on Effexor which may be helping her pain.    No aberrant behaviors noted.  Pill count today is consistent.  Contract and consent to treat have been reviewed and established.  Pharmacy checks reviewed today.  Urine drug screen was consistent as referenced above.  Alternate treatment options have been reviewed.      At this time the benefits of current medication use are felt to outweigh the risks     FOLLOW UP:   Return in about 8 weeks (around 04/24/2020).     Hinda Kehr, DO

## 2020-02-28 NOTE — Nursing Note (Signed)
Review of Systems:  Constitutional: negative except fornone  Eyes: negative except forcontacts/glasses  Ears, nose, mouth, throat, and face: negative except fornone  Respiratory: negative except forpneumonia  Cardiovascular: negative except fornone  Gastrointestinal: negative except forreflux symptoms  Genitourinary:negative except fornone  Integument/breast: negative except fornone  Hematologic/lymphatic: negative except foreasy bruising and bleeding  Musculoskeletal:negative except forstiff joints, neck pain, back pain and bone pain  Neurological: negative except fornone  Behavioral/Psych: negative except fornone  Endocrine: negative except fordiabetes and thyroid disease

## 2020-03-01 ENCOUNTER — Encounter (HOSPITAL_BASED_OUTPATIENT_CLINIC_OR_DEPARTMENT_OTHER): Payer: Commercial Managed Care - PPO | Admitting: PHYSICIAN ASSISTANT

## 2020-03-01 ENCOUNTER — Ambulatory Visit (HOSPITAL_BASED_OUTPATIENT_CLINIC_OR_DEPARTMENT_OTHER): Payer: Self-pay | Admitting: Family Medicine

## 2020-03-01 NOTE — Telephone Encounter (Signed)
Pt states that she is currently taking her Metformin as ordered. Pt states that the Jarvis Morgan is too expensive and that she spoke with [facility] and they state that they will cover: Ozempic,Trulicity,Victoza, Glyxambi. She would like to be started on of these medications to replace  The [place].  Ranae Pila, RN

## 2020-03-02 ENCOUNTER — Other Ambulatory Visit (HOSPITAL_BASED_OUTPATIENT_CLINIC_OR_DEPARTMENT_OTHER): Payer: Self-pay | Admitting: Family Medicine

## 2020-03-02 ENCOUNTER — Ambulatory Visit (HOSPITAL_BASED_OUTPATIENT_CLINIC_OR_DEPARTMENT_OTHER): Payer: Self-pay | Admitting: Family Medicine

## 2020-03-02 DIAGNOSIS — E119 Type 2 diabetes mellitus without complications: Secondary | ICD-10-CM

## 2020-03-02 MED ORDER — TRULICITY 0.75 MG/0.5 ML SUBCUTANEOUS PEN INJECTOR
0.7500 mg | PEN_INJECTOR | SUBCUTANEOUS | 2 refills | Status: DC
Start: 2020-03-02 — End: 2020-03-08

## 2020-03-02 NOTE — Telephone Encounter (Signed)
Spoke with pt and advised her that Dr. Russ Halo was going to start her on Trulicity. Advised her to call if she has any issues with the medication.  Ranae Pila, RN

## 2020-03-02 NOTE — Telephone Encounter (Signed)
Pt states that she is currently taking her Metformin as ordered. Pt states that the Jarvis Morgan is too expensive and that she spoke with [facility] and they state that they will cover: Ozempic,Trulicity,Victoza, Glyxambi. She would like to be started on of these medications to replace  The [place]. Please advise.  Ranae Pila, RN

## 2020-03-03 ENCOUNTER — Other Ambulatory Visit (HOSPITAL_BASED_OUTPATIENT_CLINIC_OR_DEPARTMENT_OTHER): Payer: Self-pay | Admitting: Family Medicine

## 2020-03-03 DIAGNOSIS — F329 Major depressive disorder, single episode, unspecified: Secondary | ICD-10-CM

## 2020-03-03 DIAGNOSIS — F32A Depression, unspecified: Secondary | ICD-10-CM

## 2020-03-05 NOTE — Telephone Encounter (Signed)
Last patient visit 12/07/2019   Next patient visit 03/15/2020

## 2020-03-06 ENCOUNTER — Other Ambulatory Visit (HOSPITAL_BASED_OUTPATIENT_CLINIC_OR_DEPARTMENT_OTHER): Payer: Self-pay | Admitting: Family Medicine

## 2020-03-06 DIAGNOSIS — G8929 Other chronic pain: Secondary | ICD-10-CM

## 2020-03-06 DIAGNOSIS — R1013 Epigastric pain: Secondary | ICD-10-CM

## 2020-03-06 DIAGNOSIS — R109 Unspecified abdominal pain: Secondary | ICD-10-CM

## 2020-03-06 DIAGNOSIS — K219 Gastro-esophageal reflux disease without esophagitis: Secondary | ICD-10-CM

## 2020-03-07 ENCOUNTER — Ambulatory Visit (HOSPITAL_BASED_OUTPATIENT_CLINIC_OR_DEPARTMENT_OTHER): Payer: Self-pay | Admitting: Family Medicine

## 2020-03-07 NOTE — Telephone Encounter (Signed)
Regarding: Dr Russ Halo  ----- Message from Laurance Flatten sent at 03/07/2020 12:35 PM EDT -----  Wants a print out of her current med list for her pain management doctor.

## 2020-03-07 NOTE — Telephone Encounter (Signed)
Med list printed for patient to pick up as requested.  Ranae Pila, RN

## 2020-03-08 ENCOUNTER — Other Ambulatory Visit (HOSPITAL_BASED_OUTPATIENT_CLINIC_OR_DEPARTMENT_OTHER): Payer: Self-pay | Admitting: Family Medicine

## 2020-03-08 ENCOUNTER — Telehealth: Payer: Self-pay | Admitting: Gastroenterology

## 2020-03-08 DIAGNOSIS — R109 Unspecified abdominal pain: Secondary | ICD-10-CM

## 2020-03-08 DIAGNOSIS — G8929 Other chronic pain: Secondary | ICD-10-CM

## 2020-03-08 DIAGNOSIS — E119 Type 2 diabetes mellitus without complications: Secondary | ICD-10-CM

## 2020-03-08 MED ORDER — TRULICITY 0.75 MG/0.5 ML SUBCUTANEOUS PEN INJECTOR
0.75 mg | PEN_INJECTOR | SUBCUTANEOUS | 2 refills | Status: DC
Start: 2020-03-08 — End: 2020-08-07

## 2020-03-08 NOTE — Telephone Encounter (Addendum)
Pt is requesting Referral to pain management for pancreatitis . Having problems with GERD AND WANTS SAMPLES OF NEXIUM but we do not have any. However, we do have $3.00 off coupons. Pt stated her husband will come by to pick them up. Pt thanked me for the call. I also advised her tht if her pain gets worse to go to the er. Pt stated that she knows and that is what we keep telling her. However, she can not afford to keep going to the er for chronic pain

## 2020-03-08 NOTE — Telephone Encounter (Signed)
forwarding to AB as we don't have orders for referral

## 2020-03-08 NOTE — Telephone Encounter (Signed)
Pt has been talking with AB via MyChart and was told to call the office because AB needed more information of what is going on with patient. Pt asked for the nurse to call her before 1pm because she will be leaving for work. [phone]

## 2020-03-09 ENCOUNTER — Emergency Department (HOSPITAL_COMMUNITY)
Admission: EM | Admit: 2020-03-09 | Discharge: 2020-03-09 | Disposition: A | Discharge status: Home / Self Care | Payer: Managed Care, Other (non HMO) | Attending: Emergency Medicine | Admitting: Emergency Medicine

## 2020-03-09 ENCOUNTER — Encounter (HOSPITAL_COMMUNITY): Payer: Self-pay | Admitting: *Deleted

## 2020-03-09 ENCOUNTER — Emergency Department (HOSPITAL_COMMUNITY): Payer: Managed Care, Other (non HMO)

## 2020-03-09 ENCOUNTER — Other Ambulatory Visit: Payer: Self-pay

## 2020-03-09 DIAGNOSIS — R109 Unspecified abdominal pain: Secondary | ICD-10-CM | POA: Diagnosis present

## 2020-03-09 DIAGNOSIS — R1010 Upper abdominal pain, unspecified: Secondary | ICD-10-CM

## 2020-03-09 DIAGNOSIS — Z79899 Other long term (current) drug therapy: Secondary | ICD-10-CM | POA: Diagnosis not present

## 2020-03-09 DIAGNOSIS — Z87891 Personal history of nicotine dependence: Secondary | ICD-10-CM | POA: Insufficient documentation

## 2020-03-09 LAB — COMPREHENSIVE METABOLIC PANEL
ALT: 48 U/L — ABNORMAL HIGH (ref 0–44)
AST: 25 U/L (ref 15–41)
Albumin: 4.2 g/dL (ref 3.5–5.0)
Alkaline Phosphatase: 103 U/L (ref 38–126)
Anion gap: 7 (ref 5–15)
BUN: 17 mg/dL (ref 6–20)
CO2: 26 mmol/L (ref 22–32)
Calcium: 9 mg/dL (ref 8.9–10.3)
Chloride: 107 mmol/L (ref 98–111)
Creatinine, Ser: 0.7 mg/dL (ref 0.44–1.00)
GFR calc Af Amer: >60 mL/min (ref >60)
GFR calc non Af Amer: >60 mL/min (ref >60)
Glucose, Bld: 94 mg/dL (ref 70–99)
Potassium: 3.6 mmol/L (ref 3.5–5.1)
Sodium: 140 mmol/L (ref 135–145)
Total Bilirubin: 0.8 mg/dL (ref 0.3–1.2)
Total Protein: 7 g/dL (ref 6.5–8.1)

## 2020-03-09 LAB — CBC WITH DIFFERENTIAL/PLATELET
Abs Immature Granulocytes: 0.02 10*3/uL (ref 0.00–0.07)
Basophils Absolute: 0 10*3/uL (ref 0.0–0.1)
Basophils Relative: 0 %
Eosinophils Absolute: 0.1 10*3/uL (ref 0.0–0.5)
Eosinophils Relative: 2 %
HCT: 37.7 % (ref 36.0–46.0)
Hemoglobin: 12.6 g/dL (ref 12.0–15.0)
Immature Granulocytes: 0 %
Lymphocytes Relative: 27 %
Lymphs Abs: 1.4 10*3/uL (ref 0.7–4.0)
MCH: 29.4 pg (ref 26.0–34.0)
MCHC: 33.4 g/dL (ref 30.0–36.0)
MCV: 88.1 fL (ref 80.0–100.0)
Monocytes Absolute: 0.4 10*3/uL (ref 0.1–1.0)
Monocytes Relative: 8 %
Neutro Abs: 3.2 10*3/uL (ref 1.7–7.7)
Neutrophils Relative %: 63 %
Platelets: 181 10*3/uL (ref 150–400)
RBC: 4.28 MIL/uL (ref 3.87–5.11)
RDW: 12.9 % (ref 11.5–15.5)
WBC: 5.2 10*3/uL (ref 4.0–10.5)
nRBC: 0 % (ref 0.0–0.2)

## 2020-03-09 LAB — LIPASE, BLOOD: Lipase: 18 U/L (ref 11–51)

## 2020-03-09 MED ORDER — SODIUM CHLORIDE 0.9 % IV BOLUS
1000.0000 mL | Freq: Once | INTRAVENOUS | Status: AC
  Administered 2020-03-09: 17:00:00 1000 mL via INTRAVENOUS

## 2020-03-09 MED ORDER — ONDANSETRON 4 MG PO TBDP: ORAL_TABLET | ORAL | 0 refills | Status: DC

## 2020-03-09 MED ORDER — HYDROCODONE-ACETAMINOPHEN 5-325 MG PO TABS: 1.0000 | ORAL_TABLET | Freq: Four times a day (QID) | ORAL | 0 refills | Status: DC | PRN

## 2020-03-09 MED ORDER — HYDROMORPHONE HCL 1 MG/ML IJ SOLN
1.0000 mg | Freq: Once | INTRAMUSCULAR | Status: AC
  Administered 2020-03-09: 1 mg via INTRAVENOUS
  Filled 2020-03-09: qty 1

## 2020-03-09 MED ORDER — PANTOPRAZOLE SODIUM 40 MG IV SOLR
40.0000 mg | Freq: Once | INTRAVENOUS | Status: AC
  Administered 2020-03-09: 40 mg via INTRAVENOUS
  Filled 2020-03-09: qty 40

## 2020-03-09 MED ORDER — IOHEXOL 300 MG/ML  SOLN
100.0000 mL | Freq: Once | INTRAMUSCULAR | Status: AC | PRN
  Administered 2020-03-09: 18:00:00 100 mL via INTRAVENOUS

## 2020-03-09 MED ORDER — ONDANSETRON HCL 4 MG/2ML IJ SOLN
4.0000 mg | Freq: Once | INTRAMUSCULAR | Status: AC
  Administered 2020-03-09: 4 mg via INTRAVENOUS
  Filled 2020-03-09: qty 2

## 2020-03-09 NOTE — ED Provider Notes (Signed)
[facility] EMERGENCY DEPARTMENT Provider Note   CSN: [id number] Arrival date & time: 03/09/20  1621     History Chief Complaint  Patient presents with  . Abdominal Pain    Ann [name] is a 54 y.o. female.  Patient complains of upper abdominal pain.  Patient recently had a stent placed in her common bile duct.  She has no vomiting.  She states she had the stent placed a week ago and has been having pain ever since then  The history is provided by the patient. No language interpreter was used.  Abdominal Pain Pain location:  Epigastric Pain quality: aching   Pain radiates to:  Does not radiate Pain severity:  Moderate Onset quality:  Sudden Timing:  Constant Chronicity:  Recurrent Relieved by:  Nothing Associated symptoms: no chest pain, no cough, no diarrhea, no fatigue and no hematuria        Past Medical History:  Diagnosis Date  . Anxiety   . Arthritis   . Chronic back pain   . Complication of anesthesia    pt states, "i have migraine and high BP after surgery.".  Marland Kitchen Depression   . GERD (gastroesophageal reflux disease)   . Migraines   . Pancreatitis     Patient Active Problem List   Diagnosis Date Noted  . Chronic diarrhea 12/28/2017  . GERD (gastroesophageal reflux disease) 12/28/2017  . Neck pain 02/12/2016  . Paresthesias 02/12/2016  . Vision changes 02/12/2016  . History of cervical spine trauma 02/12/2016  . Urinary incontinence 02/12/2016  . Acute pancreatitis 11/24/2014  . Chronic abdominal pain 11/24/2014    Past Surgical History:  Procedure Laterality Date  . BILE DUCT STENT PLACEMENT  07/2011   Dr. Steffanie Dunn: fully covered metal wall stent placed.   . CHOLECYSTECTOMY  2010   with bile leak  . COLONOSCOPY  2011   [facility]  . COLONOSCOPY WITH PROPOFOL N/A 03/02/2018   two 2-3 mm hyperplastic polyps, torturous left colon, mild diverticulosis in rectosigmoid, internal hemorrhoids, colonic biopsies negative  . ERCP  2011-2013    multiple, with multiple stent placements/exchanges. Metal wall stent placed in Aug 2012 AND STILL PRESENT AS OF 2019  . ESOPHAGOGASTRODUODENOSCOPY (EGD) WITH PROPOFOL N/A 03/02/2018   dysphagia due to benign-appearing esophageal stricture/GERD, s/p dilation, small hiatal hernia, gastritis.   Marland Kitchen SAVORY DILATION N/A 03/02/2018   Procedure: SAVORY DILATION;  Surgeon: West Bali, MD;  Location: [facility] ENDO SUITE;  Service: Endoscopy;  Laterality: N/A;     OB History    Gravida      Para      Term      Preterm      AB      Living  2     SAB      TAB      Ectopic      Multiple      Live Births              Family History  Problem Relation Age of Onset  . Cervical cancer Mother   . Heart attack Other   . Migraines Neg Hx   . Seizures Neg Hx   . Dementia Neg Hx   . Colon cancer Neg Hx     Social History   Tobacco Use  . Smoking status: Former Smoker    Packs/day: 0.25    Years: 10.00    Pack years: 2.50    Types: Cigarettes    Quit date: 12/09/2011  Years since quitting: 8.2  . Smokeless tobacco: Never Used  Substance Use Topics  . Alcohol use: No  . Drug use: No    Home Medications Prior to Admission medications   Medication Sig [name] Date End Date Taking? Authorizing Provider  ALPRAZolam Duanne Moron) 1 MG tablet Take 0.5-1 mg by mouth 2 (two) times daily as needed for anxiety or sleep. 01/07/18   [provider]  amphetamine-dextroamphetamine (ADDERALL) 20 MG tablet Take 20 mg by mouth every morning. As needed    [provider]  esomeprazole (NEXIUM) 40 MG capsule Take 1 capsule (40 mg total) by mouth daily before breakfast. 02/17/20 02/16/21  Annitta Needs, NP  HYDROcodone-acetaminophen (NORCO/VICODIN) 5-325 MG tablet Take 1 tablet by mouth every 6 (six) hours as needed for moderate pain. 03/09/20   Milton Ferguson, MD  ibuprofen (ADVIL,MOTRIN) 800 MG tablet Take 800 mg by mouth every 8 (eight) hours as needed (for joint pain.).     [provider]  lipase/protease/amylase (CREON) 36000 UNITS CPEP capsule Take 2 capsules (72,000 Units total) by mouth 3 (three) times daily with meals. 1 with snacks 01/05/20   Annitta Needs, NP  Melatonin 5 MG CAPS Take by mouth at bedtime.    [provider]  ondansetron (ZOFRAN ODT) 4 MG disintegrating tablet 4mg  ODT q4 hours prn nausea/vomit 03/09/20   Milton Ferguson, MD  traZODone (DESYREL) 50 MG tablet Take 50 mg by mouth at bedtime.    [provider]    Allergies    Imitrex [sumatriptan], Cymbalta [duloxetine hcl], Dicyclomine, Doxycycline, Gabapentin, Lyrica [pregabalin], Nucynta [tapentadol], Nuvigil [armodafinil], Other, Penicillins, Tape, Amoxicillin-pot clavulanate, Butrans [buprenorphine], and Ciprofloxacin  Review of Systems   Review of Systems  Constitutional: Negative for appetite change and fatigue.  HENT: Negative for congestion, ear discharge and sinus pressure.   Eyes: Negative for discharge.  Respiratory: Negative for cough.   Cardiovascular: Negative for chest pain.  Gastrointestinal: Positive for abdominal pain. Negative for diarrhea.  Genitourinary: Negative for frequency and hematuria.  Musculoskeletal: Negative for back pain.  Skin: Negative for rash.  Neurological: Negative for seizures and headaches.  Psychiatric/Behavioral: Negative for hallucinations.    Physical Exam Updated Vital Signs BP (!) 157/79 (BP Location: Right Arm)   Pulse 65   Temp 98.5 F (36.9 C) (Oral)   Resp 17   Ht 5\' 6"  (1.676 m)   Wt 59 kg   LMP 05/16/2012   SpO2 99%   BMI 20.98 kg/m   Physical Exam Vitals and nursing note reviewed.  Constitutional:      Appearance: She is well-developed.  HENT:     Head: Normocephalic.     Nose: Nose normal.  Eyes:     General: No scleral icterus.    Conjunctiva/sclera: Conjunctivae normal.  Neck:     Thyroid: No thyromegaly.  Cardiovascular:     Rate and Rhythm: Normal rate and regular rhythm.     Heart sounds: No  murmur. No friction rub. No gallop.   Pulmonary:     Breath sounds: No stridor. No wheezing or rales.  Chest:     Chest wall: No tenderness.  Abdominal:     General: There is no distension.     Tenderness: There is abdominal tenderness. There is no rebound.  Musculoskeletal:        General: Normal range of motion.     Cervical back: Neck supple.  Lymphadenopathy:     Cervical: No cervical adenopathy.  Skin:  Findings: No erythema or rash.  Neurological:     Mental Status: She is oriented to person, place, and time.     Motor: No abnormal muscle tone.     Coordination: Coordination normal.  Psychiatric:        Behavior: Behavior normal.     ED Results / Procedures / Treatments   Labs (all labs ordered are listed, but only abnormal results are displayed) Labs Reviewed  COMPREHENSIVE METABOLIC PANEL - Abnormal; Notable for the following components:      Result Value   ALT 48 (*)    All other components within normal limits  CBC WITH DIFFERENTIAL/PLATELET  LIPASE, BLOOD    EKG None  Radiology CT ABDOMEN PELVIS W CONTRAST  Result Date: 03/09/2020 CLINICAL DATA:  Worsening upper abdominal pain for 2 days, recent ERCP, nausea and dizziness EXAM: CT ABDOMEN AND PELVIS WITH CONTRAST TECHNIQUE: Multidetector CT imaging of the abdomen and pelvis was performed using the standard protocol following bolus administration of intravenous contrast. CONTRAST:  OMNIPAQUE IOHEXOL 300 MG/ML  SOLN COMPARISON:  02/02/2020, 01/12/2020 FINDINGS: Lower chest: No acute pleural or parenchymal lung disease. Hepatobiliary: There is a common bile duct stent extending into the duodenal lumen. Pneumobilia confirms stent patency. Otherwise no focal liver abnormalities. The gallbladder is surgically absent. Pancreas: Unremarkable. No pancreatic ductal dilatation or surrounding inflammatory changes. Spleen: Stable borderline enlargement the spleen. No focal abnormalities. Adrenals/Urinary Tract: Adrenal  glands are unremarkable. Kidneys are normal, without renal calculi, focal lesion, or hydronephrosis. Bladder is unremarkable. Stomach/Bowel: No bowel obstruction or ileus. Normal appendix right lower quadrant. No bowel wall thickening or inflammatory changes. Vascular/Lymphatic: Aortic atherosclerosis. No enlarged abdominal or pelvic lymph nodes. Reproductive: Uterus and bilateral adnexa are unremarkable. Other: No abdominal wall hernia or abnormality. No abdominopelvic ascites. Musculoskeletal: No acute or destructive bony lesions. Reconstructed images demonstrate no additional findings. IMPRESSION: 1. Common bile duct stent, with pneumobilia confirming stent patency. 2. No acute intra-abdominal or intrapelvic process. 3.  Aortic Atherosclerosis (ICD10-I70.0). Electronically Signed   By: Sharlet Salina M.D.   On: 03/09/2020 18:32    Procedures Procedures (including critical care time)  Medications Ordered in ED Medications  HYDROmorphone (DILAUDID) injection 1 mg (1 mg Intravenous Given 03/09/20 1722)  ondansetron (ZOFRAN) injection 4 mg (4 mg Intravenous Given 03/09/20 1720)  sodium chloride 0.9 % bolus 1,000 mL (1,000 mLs Intravenous New Bag/Given 03/09/20 1718)  pantoprazole (PROTONIX) injection 40 mg (40 mg Intravenous Given 03/09/20 1724)  iohexol (OMNIPAQUE) 300 MG/ML solution 100 mL (100 mLs Intravenous Contrast Given 03/09/20 1816)    ED Course  I have reviewed the triage vital signs and the nursing notes.  Pertinent labs & imaging results that were available during my care of the patient were reviewed by me and considered in my medical decision making (see chart for details).    MDM Rules/Calculators/A&P                      Labs and CT scan unremarkable.  Patient is feeling better with treatment in the emergency department.  We will increase her Nexium to twice a day and she will follow-up with her GI doctor Final Clinical Impression(s) / ED Diagnoses Final diagnoses:  Pain of upper  abdomen    Rx / DC Orders ED Discharge Orders         Ordered    ondansetron (ZOFRAN ODT) 4 MG disintegrating tablet     03/09/20 1847    HYDROcodone-acetaminophen (NORCO/VICODIN) 5-325  MG tablet  Every 6 hours PRN     03/09/20 1849           Bethann Berkshire, MD 03/09/20 1857

## 2020-03-09 NOTE — ED Triage Notes (Signed)
Patient comes to the ED with upper abdominal pain worsening over the last two days.  Patient had an ERCP done at [facility] on March 26.  Patient has nausea and dizziness beginning today.

## 2020-03-09 NOTE — Discharge Instructions (Addendum)
Contact your GI doctor next week and let them know how you are doing.  Increase your Nexium to twice a day

## 2020-03-10 ENCOUNTER — Encounter (INDEPENDENT_AMBULATORY_CARE_PROVIDER_SITE_OTHER): Payer: Self-pay | Admitting: Physical Medicine & Rehabilitation

## 2020-03-12 NOTE — Addendum Note (Signed)
Addended by: Armstead Peaks on: 03/12/2020 01:52 PM   Modules accepted: Orders

## 2020-03-12 NOTE — Telephone Encounter (Signed)
I faxed a request for her ERCP report

## 2020-03-12 NOTE — Telephone Encounter (Signed)
Referral sent to Dr. Gerilyn Pilgrim for pain management referral.

## 2020-03-12 NOTE — Telephone Encounter (Signed)
Please refer to Pain Management for history of chronic abdominal pain.  I also need ERCP reports from [facility].

## 2020-03-13 ENCOUNTER — Ambulatory Visit (INDEPENDENT_AMBULATORY_CARE_PROVIDER_SITE_OTHER): Payer: Commercial Managed Care - PPO | Admitting: Family

## 2020-03-13 ENCOUNTER — Other Ambulatory Visit: Payer: Self-pay

## 2020-03-13 ENCOUNTER — Encounter (INDEPENDENT_AMBULATORY_CARE_PROVIDER_SITE_OTHER): Payer: Self-pay | Admitting: Family

## 2020-03-13 VITALS — BP 110/60 | HR 82 | Temp 97.5°F | Resp 18 | Ht 66.0 in | Wt 235.2 lb

## 2020-03-13 DIAGNOSIS — H66002 Acute suppurative otitis media without spontaneous rupture of ear drum, left ear: Secondary | ICD-10-CM

## 2020-03-13 DIAGNOSIS — J0100 Acute maxillary sinusitis, unspecified: Secondary | ICD-10-CM

## 2020-03-13 DIAGNOSIS — J0110 Acute frontal sinusitis, unspecified: Secondary | ICD-10-CM

## 2020-03-13 MED ORDER — AZITHROMYCIN 250 MG TABLET
ORAL_TABLET | ORAL | 0 refills | Status: DC
Start: 2020-03-13 — End: 2020-07-04

## 2020-03-13 NOTE — Telephone Encounter (Signed)
Noted. 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

## 2020-03-13 NOTE — Progress Notes (Signed)
[place]  [address]  [place] [id number]    Progress Note    Name: Monique [name] MRN:  [id number]   Date: 03/13/2020 Age: 54 y.o.         Reason for Visit: Sinus Problem    Nursing Notes:  There are no exam notes on file for this visit.    History of Present Illness  Monique [name] is a 54 y.o. female who is being seen today for sinus headache. PT says that she has had the s/s for 2 days. Pt says that her ears are itching and eyes are watering and the she does have a headache. Pt says that she has had both of the COVID vaccines. Please see the ROS.    Past Medical History:   Diagnosis Date    Anxiety     Carotid stenosis     Chronic back pain     Coronary artery disease involving native coronary artery of native heart     DDD (degenerative disc disease), lumbar     "entire spine"    Ear piercing     GERD (gastroesophageal reflux disease)     H/O complete eye exam     2 years, Dr. [name], at [facility]    History of dental examination     dentures upper and lower    HTN     Hypercholesterolemia     Hyperlipidemia     MI (myocardial infarction) (CMS [facility]) 2012    Neck problem     herniated cervical disc    Obesity (BMI 30-39.9)     Solitary nodule of right lobe of thyroid 03/30/2018    Incidental finding on MRI thoracic spine. [place] ordered.     Stroke (CMS [facility])     2002    Tattoo     Left breast, Right shoulder    Thyroid disorder     Thyroid nodule     Uncontrolled type 2 diabetes mellitus, without long-term current use of insulin (CMS [facility])     Xanthelasma          Past Surgical History:   Procedure Laterality Date    ENDOMETRIAL ABLATION  1998    HX ANKLE FRACTURE [place]    Right, Doran Durand procedure    HX CATARACT REMOVAL Bilateral 2013    r and l eyes with implants    HX CESAREAN SECTION  06/20/2000    x3, 11/21/86, 11/10/84    HX CHOLECYSTECTOMY  1988    HX CORONARY ARTERY BYPASS GRAFT  03/31/2011    cardiac, 2 vessel, Dr. [name], [facility]    [place]  2012    x4 stents    HX GASTRIC SLEEVE  02/2017    [place], [place], Dr. [name] HEART CATHETERIZATION  2012    massive heart attack and stents - [facility]    HX THYROID BIOPSY      HX THYROIDECTOMY  101/01/2018    HX TONSILLECTOMY      as child    HX YAG Left 09/20/2013         Current Outpatient Medications   Medication Sig    ACCU-CHEK AVIVA PLUS TEST STRP Strip USE 1 STRIP TO TEST BLOOD SUGAR THREE TIMES A DAY    azithromycin (ZITHROMAX) 250 mg Oral Tablet Take 500 mg (2 tab) on day 1; take 250 mg (1 tab) on days 2-5.    Blood-Glucose Meter (  ACCU-CHEK AVIVA PLUS METER) Misc 1 Kit by Does not apply route Three times a day    clopidogreL (PLAVIX) 75 mg Oral Tablet Take 1 Tab (75 mg total) by mouth Once a day    cyanocobalamin (VITAMIN B12) 1,000 mcg/mL Injection Solution 1 mL (1,000 mcg total) by Subcutaneous route Every 30 days    diclofenac sodium (VOLTAREN) 1 % Gel 2 g by Apply Topically route Three times a day as needed    docusate sodium (COLACE) 100 mg Oral Capsule Take 1 Cap (100 mg total) by mouth Twice daily    dulaglutide (TRULICITY) 8.01 KP/5.3 mL Subcutaneous Pen Injector 0.5 mL (0.75 mg total) by Subcutaneous route Every 7 days    empagliflozin-linagliptin (GLYXAMBI) 10-5 mg Oral Tablet TAKE 1 TABLET BY MOUTH ONCE DAILY    ergocalciferol, vitamin D2, (DRISDOL) 1,250 mcg (50,000 unit) Oral Capsule TAKE 1 CAPSULE BY MOUTH EVERY WEEK    exenatide microspheres ER (BYDUREON) 2 mg Subcutaneous 2 mg by Subcutaneous route Every 7 days (Patient not taking: Reported on 03/13/2020)    furosemide (LASIX) 40 mg Oral Tablet TAKE 1 TABLET(40 MG) BY MOUTH EVERY DAY    gabapentin (NEURONTIN) 100 mg Oral Capsule Take 3 Capsules (300 mg total) by mouth Three times a day for 30 days (Patient not taking: Reported on 03/13/2020)    Ibuprofen (MOTRIN) 800 mg Oral Tablet Take 1 Tab (800 mg total) by mouth Three times a day as needed for Pain    levothyroxine (SYNTHROID) 25 mcg Oral  Tablet Take 1 Tab (25 mcg total) by mouth Once a day    lisinopriL (PRINIVIL) 2.5 mg Oral Tablet TAKE 1 TABLET BY MOUTH EVERY DAY    metFORMIN (GLUCOPHAGE) 500 mg Oral Tablet take 1 tablet by mouth twice a day with food    methocarbamoL (ROBAXIN) 500 mg Oral Tablet Take 1 Tab (500 mg total) by mouth Three times a day as needed (muscle spasms)    metoprolol succinate (TOPROL-XL) 25 mg Oral Tablet Sustained Release 24 hr Take 0.5 Tabs (12.5 mg total) by mouth Once a day    NARCAN 4 mg/actuation Nasal Spray, Non-Aerosol PT STATED SHE HAS NARCAN AT HOME BUT IS NOT TAKING IT    niacin (NIASPAN) 500 mg Oral Tablet Sustained Release Take 1 Tab (500 mg total) by mouth Once a day    nitroGLYCERIN (NITROSTAT) 0.4 mg Sublingual Tablet, Sublingual 1 Tab (0.4 mg total) by Sublingual route Every 5 minutes as needed for Chest pain for 3 doses over 15 minutes    omeprazole (PRILOSEC) 40 mg Oral Capsule, Delayed Release(E.C.) TAKE 1 CAPSULE BY MOUTH ONCE DAILY    ondansetron (ZOFRAN ODT) 8 mg Oral Tablet, Rapid Dissolve DISSOLVE 1 TABLET(8 MG) ON THE TONGUE EVERY 8 HOURS AS NEEDED FOR NAUSEA OR VOMITING    PRENATAL PLUS, CALCIUM CARB, 27 mg iron- 1 mg Oral Tablet Take 1 Tab by mouth Once a day    PROVENTIL HFA 90 mcg/actuation Inhalation HFA Aerosol Inhaler Take 1-2 Puffs by inhalation Every 4 hours as needed    rosuvastatin (CRESTOR) 20 mg Oral Tablet TAKE 1 TABLET BY MOUTH ONCE DAILY    venlafaxine (EFFEXOR XR) 37.5 mg Oral Capsule, Sust. Release 24 hr TAKE 1 CAPSULE BY MOUTH ONCE DAILY    VENTOLIN HFA 90 mcg/actuation Inhalation HFA Aerosol Inhaler Take 1-2 Puffs by inhalation Every 6 hours as needed    ZYRTEC-D 5-120 mg Oral Tablet Sustained Release 12 hr Take 1 Tab by mouth Once a day  Allergies   Allergen Reactions    Capron Dm [Pyrilamine-Dextromethorphan]  Other Adverse Reaction (Add comment)     LIGHT HEADED    Tylenol W Codeine [Acetaminophen-Codeine]  Other Adverse Reaction (Add comment)     pt states  that one time she had chest pains with this med, but dr. thought it was because she had not eaten     Family Medical History:     Problem Relation (Age of Onset)    Atrial fibrillation Sister    Colon Cancer Father    Diabetes Mother, Maternal Aunt    Heart Disease Sister, Maternal Grandfather    Hypertension (High Blood Pressure) Mother    Lung Cancer Maternal Grandfather    MI <74 years of age Mother            Social History     Tobacco Use    Smoking status: Current Every Day Smoker     Packs/day: 0.50     Years: 20.00     Pack years: 10.00     Types: Cigarettes     Start date: 60    Smokeless tobacco: Never Used   Brewing technologist Use: Never used   Substance Use Topics    Alcohol use: No    Drug use: No       Review of Systems  Review of Systems   Constitutional: Negative for chills, diaphoresis, fever and malaise/fatigue.   HENT: Positive for congestion (nasal), ear pain (itching) and sinus pain (frontal and maxillary). Negative for ear discharge and sore throat.    Respiratory: Negative for cough, sputum production, shortness of breath and wheezing.    Gastrointestinal: Negative for diarrhea, nausea and vomiting.   Neurological: Positive for headaches. Negative for dizziness.       Physical Exam:  BP 110/60 (Site: Left, Patient Position: Sitting, Cuff Size: Adult)    Pulse 82    Temp 36.4 C (97.5 F) (Thermal Scan)    Resp 18    Ht 1.676 m (5' 6" )    Wt 107 kg (235 lb 3.2 oz)    LMP  (LMP Unknown)    SpO2 96%    BMI 37.96 kg/m       Physical Exam  Vitals reviewed.   Constitutional:       General: She is not in acute distress.     Appearance: Normal appearance. She is obese. She is not ill-appearing.   HENT:      Right Ear: Ear canal and external ear normal. Tympanic membrane is bulging.      Left Ear: Ear canal and external ear normal. Tympanic membrane is erythematous and bulging.      Nose:      Right Sinus: Maxillary sinus tenderness and frontal sinus tenderness present.      Left Sinus:  Maxillary sinus tenderness and frontal sinus tenderness present.      Mouth/Throat:      Pharynx: Posterior oropharyngeal erythema (slightly) present.   Cardiovascular:      Rate and Rhythm: Normal rate and regular rhythm.   Pulmonary:      Effort: Pulmonary effort is normal. No respiratory distress.      Breath sounds: Normal breath sounds. No wheezing, rhonchi or rales.   Skin:     General: Skin is warm and dry.   Neurological:      Mental Status: She is alert and oriented to person, place, and time.  Assessment:      ICD-10-CM    1. Acute non-recurrent maxillary sinusitis  J01.00 azithromycin (ZITHROMAX) 250 mg Oral Tablet   2. Acute non-recurrent frontal sinusitis  J01.10 azithromycin (ZITHROMAX) 250 mg Oral Tablet   3. Non-recurrent acute suppurative otitis media of left ear without spontaneous rupture of tympanic membrane  H66.002 azithromycin (ZITHROMAX) 250 mg Oral Tablet             Plan:    Orders Placed This Encounter    azithromycin (ZITHROMAX) 250 mg Oral Tablet      Rest and Fluids,  May use Tylenol or Ibuprofen as needed for fever or pain,  May use Mucinex or Robitussin as needed for cough or congestion.  Take prescriptions as instructed.    Follow up with Primary Care or return to [facility] if signs and symptoms do not improve in 48-72 hours.      Seek medical attention for new or worsening symptoms.        Arlester Marker, APRN  03/13/2020, 15:55

## 2020-03-13 NOTE — Patient Instructions (Addendum)
Rest and Fluids,  May use Tylenol or Ibuprofen as needed for fever or pain,  May use Mucinex or Robitussin as needed for cough or congestion.  Take prescriptions as instructed.    Follow up with Primary Care or return to [facility] if signs and symptoms do not improve in 48-72 hours.          Understanding Your Sinuses  Your sinuses are air-filled spaces between the bones in your head. They have small openings that connect to the nasal cavity. The sinuses make mucus that drains into the nose. This helps keep the nose moist and free of dust and germs.       Parts of the nasal cavity   The septum is the wall of cartilage and bone in the center of the nasal cavity.   The middle meatus is the intersection between the sinuses.   Turbinates are ridges on the sides of the nasal cavity.    Cilia keep sinuses clear    Air circulates freely though healthy sinuses. Tiny, hairlike structures called cilia line the sinuses. Cilia move the thin, watery mucus through the sinuses and into the nose. Sinuses are healthy when they drain freely. Sinus drainage can be blocked if the sinus lining is swollen or if mucus is too thick. Cilia that are damaged or dont work correctly can also lead to problems with drainage.   StayWell last reviewed this educational content on 06/07/2018   2000-2020 [employer]. All rights reserved. This information is not intended as a substitute for professional medical care. Always follow your healthcare professional's instructions.        Understanding Acute Rhinosinusitis  Acute rhinosinusitis iswhen the lining of the inside of the nose and the sinuses becomes irritated and swollen. It is also called sinusitis, or a sinus infection.  Sinuses are air-filled spaces in the skull behind the face. They are kept moist and clean by a lining of mucosa. Things such as pollen, smoke, and chemical fumes can irritate the mucosa. It can then swell up. As a response to irritation, the mucosa makes  more mucus and other fluids. Tiny hairlike cilia cover the mucosa. Cilia help carry mucus toward the opening of the sinus. Too much mucus may cause the cilia to stop working. This blocks the sinus opening. A buildup of fluid in the sinuses then causes pain and pressure. It can also cause bacteria to grow in the sinuses.    What causes acute rhinosinusitis?  A sinus infection is most often caused by a virus. You are more likely to get one after having a cold or the flu. In some cases, a sinus infection can be caused by bacteria.  You are at higher risk for a sinus infection if you:   Are older in age   Have structural problems with your sinuses   Smoke or are exposed to secondhand smoke   Are exposed to changes in pressure, such as from flying a lot or deep sea diving   Have asthma or allergies   Have a weak immune system   Have dental disease    Symptoms of acute rhinosinusitis  Symptoms of acute rhinosinusitis often last around 7 to 10 days. If you have a bacterial infection, they may last longer. They may also get better but then worsen. You may have:   Facepain or pressure under the eyes and around the nose   Headache   Fluid draining in the back of the throat (postnasal  drip)   Congestion   Drainage that is thick and colored (often green), instead of clear   Cough   Problems with your sense of smell   Ear pain or hearing problems   Fever   Tooth pain   Fatigue  Diagnosing acute rhinosinusitis  Yourhealthcare provider will ask about your symptoms and past health.He or she will look at your ears, nose, throat, and sinuses. Imaging tests, such as X-rays, are often not needed.  It can be hard to figure out if a sinus infection is caused by a virus or bacterium. A bacterial infection tends to last longer. Symptoms may also get better but then worsen. Your healthcare provider may take asample of mucus from your nose to check for bacteria.  Treating acute rhinosinusitis  Most sinus infections  will go away within 10 days. Your body will fight off the virus. If your symptoms seem to get better but then worsen, you may have a bacterial infection instead. Your healthcare provider will then give you antibiotics. Take this medicine until it is gone, even if you feel better.  To help ease your symptoms, your healthcare provider may advise:   Over-the-counter pain relievers. Medicines such as acetaminophen or ibuprofen can ease sinus pain. They may also lower a fever.   Nasal washes. Washing your nasal passages with salt water may ease pain and pressure. It can rinse out mucous and other irritants from your sinuses. Your healthcare provider can show you how to do it.   Nasal steroid spray. This prescription medicine can reduce inflammation in your sinuses.   Other medicines. Decongestants, antihistamines, and other nasal sprays may give short-term relief. They may help with congestion. Talk with your healthcare provider before taking these medicines.    Preventing acute rhinosinusitis  You can help prevent a sinus infection with these steps:   Wash your hands well and often.   Stay away from people who have a cold or upper respiratory infection.   Don't smoke. And stay away from secondhand smoke.   Use a humidifier at home.   Make sure you are up-to-date on your vaccines, such as the flu shot.    When to call your healthcare provider  Call your healthcare provider right away if you have any of these:   Fever of 100.60F (38C) or higher, or as directed by your healthcare provider   Pain that gets worse   Symptoms that dont get better, or get worse   New symptoms  StayWell last reviewed this educational content on 05/08/2018   2000-2020 [employer]. All rights reserved. This information is not intended as a substitute for professional medical care. Always follow your healthcare professional's instructions.

## 2020-03-14 ENCOUNTER — Encounter: Payer: Self-pay | Admitting: Gastroenterology

## 2020-03-14 NOTE — Telephone Encounter (Signed)
Appeal letter and copy of PA mailed to appeals department.

## 2020-03-14 NOTE — Progress Notes (Signed)
Letter completed.

## 2020-03-14 NOTE — Telephone Encounter (Signed)
Appeal letter completed and under "letters".

## 2020-03-15 ENCOUNTER — Encounter (HOSPITAL_BASED_OUTPATIENT_CLINIC_OR_DEPARTMENT_OTHER): Payer: Self-pay | Admitting: Family Medicine

## 2020-03-19 ENCOUNTER — Other Ambulatory Visit (HOSPITAL_BASED_OUTPATIENT_CLINIC_OR_DEPARTMENT_OTHER): Payer: Self-pay | Admitting: Family Medicine

## 2020-03-19 DIAGNOSIS — I6529 Occlusion and stenosis of unspecified carotid artery: Secondary | ICD-10-CM

## 2020-03-19 NOTE — Telephone Encounter (Signed)
Next/07/04/20  Last4/8/21

## 2020-03-26 MED ORDER — PANTOPRAZOLE SODIUM 40 MG PO TBEC: 40.0000 mg | DELAYED_RELEASE_TABLET | Freq: Two times a day (BID) | ORAL | 3 refills | Status: DC

## 2020-03-29 ENCOUNTER — Ambulatory Visit (HOSPITAL_BASED_OUTPATIENT_CLINIC_OR_DEPARTMENT_OTHER): Payer: Self-pay | Admitting: Family Medicine

## 2020-03-29 ENCOUNTER — Other Ambulatory Visit: Payer: Self-pay | Admitting: *Deleted

## 2020-03-29 NOTE — Addendum Note (Signed)
Addended by: Armstead Peaks on: 03/29/2020 11:07 AM   Modules accepted: Orders

## 2020-03-29 NOTE — Telephone Encounter (Signed)
Regarding: Dr Russ Halo  ----- Message from Laurance Flatten sent at 03/28/2020  3:33 PM EDT -----  Wanting to know if we received her sleep test report. She needs a copy of it to take to [facility] with her to pain management. She either needs a copy of it faxed there or she needs to pick a copy up. She needs someone to let her know about the sleep report.

## 2020-04-02 ENCOUNTER — Telehealth (INDEPENDENT_AMBULATORY_CARE_PROVIDER_SITE_OTHER): Payer: Self-pay | Admitting: Physical Medicine & Rehabilitation

## 2020-04-02 NOTE — Telephone Encounter (Signed)
Patient 03/29/2020 that she wanted all her appointment canceled.  That the Gabapentin is not helping.    According to PCP note patient is going to [place], [place] for pain medications.

## 2020-04-17 ENCOUNTER — Ambulatory Visit (INDEPENDENT_AMBULATORY_CARE_PROVIDER_SITE_OTHER): Payer: Commercial Managed Care - PPO | Admitting: Family

## 2020-04-17 ENCOUNTER — Other Ambulatory Visit: Payer: Self-pay

## 2020-04-17 ENCOUNTER — Encounter (INDEPENDENT_AMBULATORY_CARE_PROVIDER_SITE_OTHER): Payer: Self-pay | Admitting: Family

## 2020-04-17 VITALS — BP 106/60 | HR 77 | Temp 96.9°F | Resp 18 | Ht 66.0 in | Wt 231.5 lb

## 2020-04-17 DIAGNOSIS — S0502XA Injury of conjunctiva and corneal abrasion without foreign body, left eye, initial encounter: Secondary | ICD-10-CM

## 2020-04-17 MED ORDER — TOBRAMYCIN 0.3 % EYE DROPS
2.00 [drp] | Freq: Four times a day (QID) | OPHTHALMIC | 0 refills | Status: AC
Start: 2020-04-17 — End: 2020-04-25

## 2020-04-17 NOTE — Progress Notes (Signed)
[place]  [address]  [place] [address]    Progress Note    Name: Monique [name] MRN:  [id number]   Date: 04/17/2020 Age: 53 y.o.         Reason for Visit: Eye Pain (Left)    Nursing Notes:  There are no exam notes on file for this visit.    History of Present Illness  Monique [name] is a 54 y.o. female who is being seen today for for left eye pain. Pt says that the injury happened yesterday and that she is having pain in the left eye. Pt says that she was hanging up clothes and her grand-daughter accidentally hit her in the eye with a plastic hanger. Pt says that she thought that it was going to get better, but now the eye is reddened and that she does have fuzzy sight and that she is having pain as well. Please see the ROS.     Past Medical History:   Diagnosis Date   . Anxiety    . Carotid stenosis    . Chronic back pain    . Coronary artery disease involving native coronary artery of native heart    . DDD (degenerative disc disease), lumbar     "entire spine"   . Ear piercing    . GERD (gastroesophageal reflux disease)    . H/O complete eye exam     2 years, Dr. [name], at [facility]   . History of dental examination     dentures upper and lower   . HTN    . Hypercholesterolemia    . Hyperlipidemia    . MI (myocardial infarction) (CMS [place]) 2012   . Neck problem     herniated cervical disc   . Obesity (BMI 30-39.9)    . 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 [place] ordered.    . Stroke (CMS [facility])     2002   . Tattoo     Left breast, Right shoulder   . Thyroid disorder    . Thyroid nodule    . Uncontrolled type 2 diabetes mellitus, without long-term current use of insulin (CMS [place])    . Xanthelasma          Past Surgical History:   Procedure Laterality Date   . ENDOMETRIAL ABLATION  1998   . HX ANKLE FRACTURE [place]  1990s    Right, Doran Durand procedure   . HX CATARACT REMOVAL Bilateral 2013    r and l eyes with implants   . HX CESAREAN  SECTION  06/20/2000    x3, 11/21/86, 11/10/84   . HX CHOLECYSTECTOMY  1988   . HX CORONARY ARTERY BYPASS GRAFT  03/31/2011    cardiac, 2 vessel, Dr. [name], [facility]   . HX CORONARY STENT PLACEMENT  2012    x4 stents   . HX GASTRIC SLEEVE  02/2017    [place], [place], Dr. Pearlie Oyster   . HX HEART CATHETERIZATION  2012    massive heart attack and stents - [facility]   . HX THYROID BIOPSY     . HX THYROIDECTOMY  101/01/2018   . HX TONSILLECTOMY      as child   . HX YAG Left 09/20/2013         Current Outpatient Medications   Medication Sig   . ACCU-CHEK AVIVA PLUS TEST STRP Strip USE 1 STRIP TO TEST BLOOD  SUGAR THREE TIMES A DAY   . azithromycin (ZITHROMAX) 250 mg Oral Tablet Take 500 mg (2 tab) on day 1; take 250 mg (1 tab) on days 2-5. (Patient not taking: Reported on 04/17/2020)   . Blood-Glucose Meter (ACCU-CHEK AVIVA PLUS METER) Misc 1 Kit by Does not apply route Three times a day   . clopidogreL (PLAVIX) 75 mg Oral Tablet TAKE 1 TABLET BY MOUTH ONCE DAILY   . cyanocobalamin (VITAMIN B12) 1,000 mcg/mL Injection Solution 1 mL (1,000 mcg total) by Subcutaneous route Every 30 days   . diclofenac sodium (VOLTAREN) 1 % Gel 2 g by Apply Topically route Three times a day as needed   . docusate sodium (COLACE) 100 mg Oral Capsule Take 1 Cap (100 mg total) by mouth Twice daily   . dulaglutide (TRULICITY) 2.09 OB/0.9 mL Subcutaneous Pen Injector 0.5 mL (0.75 mg total) by Subcutaneous route Every 7 days   . empagliflozin-linagliptin (GLYXAMBI) 10-5 mg Oral Tablet TAKE 1 TABLET BY MOUTH ONCE DAILY   . ergocalciferol, vitamin D2, (DRISDOL) 1,250 mcg (50,000 unit) Oral Capsule TAKE 1 CAPSULE BY MOUTH EVERY WEEK   . exenatide microspheres ER (BYDUREON) 2 mg Subcutaneous 2 mg by Subcutaneous route Every 7 days (Patient not taking: Reported on 03/13/2020)   . furosemide (LASIX) 40 mg Oral Tablet TAKE 1 TABLET(40 MG) BY MOUTH EVERY DAY   . gabapentin (NEURONTIN) 100 mg Oral Capsule Take 3 Capsules (300 mg total) by mouth Three times a day for  30 days (Patient not taking: Reported on 03/13/2020)   . Ibuprofen (MOTRIN) 800 mg Oral Tablet Take 1 Tab (800 mg total) by mouth Three times a day as needed for Pain   . levothyroxine (SYNTHROID) 25 mcg Oral Tablet Take 1 Tab (25 mcg total) by mouth Once a day   . lisinopriL (PRINIVIL) 2.5 mg Oral Tablet TAKE 1 TABLET BY MOUTH EVERY DAY   . metFORMIN (GLUCOPHAGE) 500 mg Oral Tablet take 1 tablet by mouth twice a day with food   . methocarbamoL (ROBAXIN) 500 mg Oral Tablet Take 1 Tab (500 mg total) by mouth Three times a day as needed (muscle spasms)   . metoprolol succinate (TOPROL-XL) 25 mg Oral Tablet Sustained Release 24 hr Take 0.5 Tabs (12.5 mg total) by mouth Once a day   . NARCAN 4 mg/actuation Nasal Spray, Non-Aerosol PT STATED SHE HAS NARCAN AT HOME BUT IS NOT TAKING IT   . niacin (NIASPAN) 500 mg Oral Tablet Sustained Release Take 1 Tab (500 mg total) by mouth Once a day   . nitroGLYCERIN (NITROSTAT) 0.4 mg Sublingual Tablet, Sublingual 1 Tab (0.4 mg total) by Sublingual route Every 5 minutes as needed for Chest pain for 3 doses over 15 minutes   . omeprazole (PRILOSEC) 40 mg Oral Capsule, Delayed Release(E.C.) TAKE 1 CAPSULE BY MOUTH ONCE DAILY   . ondansetron (ZOFRAN ODT) 8 mg Oral Tablet, Rapid Dissolve DISSOLVE 1 TABLET(8 MG) ON THE TONGUE EVERY 8 HOURS AS NEEDED FOR NAUSEA OR VOMITING   . PRENATAL PLUS, CALCIUM CARB, 27 mg iron- 1 mg Oral Tablet Take 1 Tab by mouth Once a day   . PROVENTIL HFA 90 mcg/actuation Inhalation HFA Aerosol Inhaler Take 1-2 Puffs by inhalation Every 4 hours as needed   . rosuvastatin (CRESTOR) 20 mg Oral Tablet TAKE 1 TABLET BY MOUTH ONCE DAILY   . Tobramycin Sulfate (TOBREX) 0.3 % Ophthalmic Drops Instill 2 Drops into both eyes Four times a day for 8 days   .  venlafaxine (EFFEXOR XR) 37.5 mg Oral Capsule, Sust. Release 24 hr TAKE 1 CAPSULE BY MOUTH ONCE DAILY   . VENTOLIN HFA 90 mcg/actuation Inhalation HFA Aerosol Inhaler Take 1-2 Puffs by inhalation Every 6 hours as needed    . ZYRTEC-D 5-120 mg Oral Tablet Sustained Release 12 hr Take 1 Tab by mouth Once a day     Allergies   Allergen Reactions   . Ane Payment [Pyrilamine-Dextromethorphan]  Other Adverse Reaction (Add comment)     LIGHT HEADED   . Tylenol W Codeine [Acetaminophen-Codeine]  Other Adverse Reaction (Add comment)     pt states that one time she had chest pains with this med, but dr. thought it was because she had not eaten     Family Medical History:     Problem Relation (Age of Onset)    Atrial fibrillation Sister    Colon Cancer Father    Diabetes Mother, Maternal Aunt    Heart Disease Sister, Maternal Grandfather    Hypertension (High Blood Pressure) Mother    Lung Cancer Maternal Grandfather    MI <61 years of age Mother            Social History     Tobacco Use   . Smoking status: Current Every Day Smoker     Packs/day: 0.50     Years: 20.00     Pack years: 10.00     Types: Cigarettes     Start date: 27   . Smokeless tobacco: Never Used   Vaping Use   . Vaping Use: Never used   Substance Use Topics   . Alcohol use: No   . Drug use: No       Review of Systems  Review of Systems   Constitutional: Negative for chills, diaphoresis, fever and malaise/fatigue.   HENT: Negative for ear discharge, ear pain, sinus pain and sore throat.    Eyes: Positive for blurred vision (fuzzy), photophobia, pain (8 out of 10 on pain scale) and redness. Negative for double vision and discharge.   Respiratory: Negative for cough, sputum production, shortness of breath and wheezing.    Gastrointestinal: Negative for diarrhea, nausea and vomiting.   Neurological: Negative for dizziness and headaches.       Physical Exam:  BP 106/60 (Site: Left, Patient Position: Sitting, Cuff Size: Adult)   Pulse 77   Temp 36.1 C (96.9 F) (Thermal Scan)   Resp 18   Ht 1.676 m (5' 6" )   Wt 105 kg (231 lb 8 oz)   LMP  (LMP Unknown)   SpO2 98%   BMI 37.37 kg/m       Physical Exam  Constitutional:       General: She is not in acute distress.      Appearance: Normal appearance. She is obese.   Eyes:        Comments: Corneal abrasion noted to the left eye   Cardiovascular:      Rate and Rhythm: Normal rate and regular rhythm.      Heart sounds: Normal heart sounds. No murmur heard.     Pulmonary:      Effort: Pulmonary effort is normal. No respiratory distress.      Breath sounds: Normal breath sounds. No wheezing, rhonchi or rales.   Skin:     General: Skin is warm.   Neurological:      Mental Status: She is alert and oriented to person, place, and time.  11:51am numbed the left eye with tetracaine 0.5% Lot [id number] Expiration 03/07/2021. NDC: [id number], 2 drops were used. Then waited until the patient said the eye was numb and stained left eye with BioGlo Batch No: [id number], Exp: 12/08/2023. Used the blalck light and found a corneal abrasion to the left eye.     Assessment:      ICD-10-CM    1. Abrasion of left cornea, initial encounter  S05.02XA Tobramycin Sulfate (TOBREX) 0.3 % Ophthalmic Drops             Plan:    Orders Placed This Encounter   . Tobramycin Sulfate (TOBREX) 0.3 % Ophthalmic Drops      Pt was made and appointment with Dr Payton Emerald at [facility] at 2:00pm today.     Rest and fluids.  May use tylenol or ibuprofen OTC as needed for pain or fever.   Use antibiotic eye drop as directed.  Wash hands anytime touch the eyes or face.   Please keep appointment with the eye doctor today at [facility] at 2pm today.                     Follow up with Primary Care or return to [facility] if signs and symptoms to not improve in 48-72 hours.       Seek medical attention for new or worsening symptoms.      Arlester Marker, APRN  04/17/2020, 12:02

## 2020-04-17 NOTE — Patient Instructions (Addendum)
Rest and fluids.  May use tylenol or ibuprofen OTC as needed for pain or fever.   Use antibiotic eye drop as directed.  Wash hands anytime touch the eyes or face.   Please keep appointment with the eye doctor today at [facility] at 2pm today.                     Follow up with Primary Care or return to [facility] if signs and symptoms to not improve in 48-72 hours.       Corneal Abrasion    You have a scratch or scrape (abrasion) on your cornea. The cornea is the clear part in the front of the eye. This sensitive area is very painful when injured. You may make tears frequently, and your vision may be blurry until the injury heals. You may be sensitive to light.   This part of the body heals quickly. You can expect the pain to go away within 24 to 48 hours. If the abrasion is large or deep, your doctor may apply an eye patch, although this is not always done. An antibiotic ointment or eye drops may also be used to prevent infection.   Numbing drops may be used to relieve the pain temporarily so that your eyes can be examined. But these drops can't be prescribed for home use because that would prevent healing and lead to more serious problems. Also, if you can't feel your eye, there is a chance of accidentally injuring it further without knowing it.   Home care   A cold pack may be applied over the eye (or eye patch) for 20 minutes at a time, to reduce pain. To make a cold pack, put ice cubes in a plastic bag that seals at the top. Wrap the bag in a clean, thin towel or cloth.   You may use acetaminophen or ibuprofen to control pain, unless another pain medicine was prescribed. If you have chronic liver or kidney disease, talk with your healthcare provider before using these medicines. Also talk with your provider if you have ever had a stomach ulcer or gastrointestinal bleeding.   Rest your eyes and don't read until symptoms are gone.   If you use contact lenses, don't wear them until all symptoms are  gone.   If your vision is affected by the corneal abrasion or if an eye patch was applied, don't drive a motor vehicle or operate machinery until all symptoms are gone. You may have trouble judging distances using only one eye.   If your eyes are sensitive to light, try wearing sunglasses, or stay indoors until symptoms go away.    Follow-up care  Follow up with your healthcare provider, or as advised.   If no patch was put on your eye and the pain continues for more than 48 hours, you should have another exam. Contact your healthcare provider to arrange this.   If your eye was patched and you were asked to remove the patch yourself, see your healthcare provider. Contact your healthcare provider if you still have pain after the patch is removed.   If you were given a return appointment for patch removal and re-examination, be sure to keep the appointment. Leaving the patch in place longer than advised could be harmful.  When to seek medical advice  Call your healthcare provider right away if any of these occur.   Eye pain gets worse or does not get better after 24 hours  Discharge from the eye   Redness of the eye or swelling of the eyelids gets worse   Vision gets worse   Symptoms get worse after the abrasion has healed  [employer] last reviewed this educational content on 01/08/2019   2000-2021 [employer]. All rights reserved. This information is not intended as a substitute for professional medical care. Always follow your healthcare professional's instructions.

## 2020-04-26 ENCOUNTER — Encounter (INDEPENDENT_AMBULATORY_CARE_PROVIDER_SITE_OTHER): Payer: Self-pay | Admitting: Physical Medicine & Rehabilitation

## 2020-05-03 ENCOUNTER — Other Ambulatory Visit: Payer: Self-pay

## 2020-05-17 NOTE — Nursing Note (Signed)
05/17/20 1700   A1C   A1C 9.6   References Ranges 4 - 6%

## 2020-05-22 ENCOUNTER — Other Ambulatory Visit (HOSPITAL_BASED_OUTPATIENT_CLINIC_OR_DEPARTMENT_OTHER): Payer: Self-pay | Admitting: Family Medicine

## 2020-05-22 DIAGNOSIS — E118 Type 2 diabetes mellitus with unspecified complications: Secondary | ICD-10-CM

## 2020-05-22 MED ORDER — METFORMIN 500 MG TABLET
ORAL_TABLET | ORAL | 3 refills | Status: DC
Start: 2020-05-22 — End: 2020-07-04

## 2020-05-22 NOTE — Telephone Encounter (Signed)
Next  07/04/20  Last1/6/21

## 2020-06-07 ENCOUNTER — Encounter (HOSPITAL_BASED_OUTPATIENT_CLINIC_OR_DEPARTMENT_OTHER): Payer: Self-pay | Admitting: Nurse Practitioner

## 2020-06-20 ENCOUNTER — Encounter (HOSPITAL_BASED_OUTPATIENT_CLINIC_OR_DEPARTMENT_OTHER): Payer: Self-pay

## 2020-06-20 ENCOUNTER — Encounter (HOSPITAL_BASED_OUTPATIENT_CLINIC_OR_DEPARTMENT_OTHER): Payer: Commercial Managed Care - PPO | Admitting: Family

## 2020-07-04 ENCOUNTER — Ambulatory Visit (INDEPENDENT_AMBULATORY_CARE_PROVIDER_SITE_OTHER): Payer: Commercial Managed Care - PPO | Admitting: Family Medicine

## 2020-07-04 ENCOUNTER — Encounter (HOSPITAL_BASED_OUTPATIENT_CLINIC_OR_DEPARTMENT_OTHER): Payer: Self-pay | Admitting: Family Medicine

## 2020-07-04 ENCOUNTER — Other Ambulatory Visit: Payer: Self-pay

## 2020-07-04 VITALS — BP 110/62 | HR 57 | Temp 97.1°F | Resp 16 | Ht 66.0 in | Wt 225.0 lb

## 2020-07-04 DIAGNOSIS — M79662 Pain in left lower leg: Secondary | ICD-10-CM

## 2020-07-04 DIAGNOSIS — I6523 Occlusion and stenosis of bilateral carotid arteries: Secondary | ICD-10-CM

## 2020-07-04 DIAGNOSIS — I2510 Atherosclerotic heart disease of native coronary artery without angina pectoris: Secondary | ICD-10-CM

## 2020-07-04 DIAGNOSIS — Z951 Presence of aortocoronary bypass graft: Secondary | ICD-10-CM

## 2020-07-04 DIAGNOSIS — E119 Type 2 diabetes mellitus without complications: Secondary | ICD-10-CM

## 2020-07-04 DIAGNOSIS — F329 Major depressive disorder, single episode, unspecified: Secondary | ICD-10-CM

## 2020-07-04 DIAGNOSIS — I739 Peripheral vascular disease, unspecified: Secondary | ICD-10-CM

## 2020-07-04 DIAGNOSIS — E118 Type 2 diabetes mellitus with unspecified complications: Secondary | ICD-10-CM

## 2020-07-04 DIAGNOSIS — Z6836 Body mass index (BMI) 36.0-36.9, adult: Secondary | ICD-10-CM

## 2020-07-04 DIAGNOSIS — R634 Abnormal weight loss: Secondary | ICD-10-CM

## 2020-07-04 DIAGNOSIS — K219 Gastro-esophageal reflux disease without esophagitis: Secondary | ICD-10-CM

## 2020-07-04 LAB — POCT HGB A1C: POCT HGB A1C: 9.9 % — AB (ref 4–6)

## 2020-07-04 MED ORDER — METFORMIN 500 MG TABLET
500.00 mg | ORAL_TABLET | Freq: Three times a day (TID) | ORAL | 3 refills | Status: DC
Start: 2020-07-04 — End: 2020-07-09

## 2020-07-04 NOTE — Progress Notes (Signed)
Patient Name: Monique [name]  Date: 07/04/2020  [facility] [place], [place]  [address]  [place] [address]  [phone]  MRN: [id number]  DOB: 16-Jan-1966    Chief complaint: The patient is a 54 y.o. old female who came in today for Diabetes.    HPI:    Diabetes: Pt last A1C was 9.7% now 9.9% Pt denies any polyuria, polydipsia, parathesia, foot ulcers, vision disturbances. Pt currently taking Trulicity and  metformin for treatment.  She has been using the Trulicity every other week to save money.  It is costing her 700 dollars a month for Trulicity.  Patient is compliant with medications. Denies SE.  She is ready to have labs unfortunately our lab is closed at this time.    Ancillary care:  Patient has been to [facility] twice for sinusitis since our last encounter.  These concerns are resolved.      ROS:  Review of systems completed with all positives and pertinent negatives as per HPI. Otherwise, negative.      Past medical history:  Past Medical History:   Diagnosis Date    Anxiety     Carotid stenosis     Chronic back pain     Coronary artery disease involving native coronary artery of native heart     DDD (degenerative disc disease), lumbar     "entire spine"    Ear piercing     GERD (gastroesophageal reflux disease)     H/O complete eye exam     2 years, Dr. [name], at [facility]    History of dental examination     dentures upper and lower    HTN     Hypercholesterolemia     Hyperlipidemia     MI (myocardial infarction) (CMS [facility]) 2012    Neck problem     herniated cervical disc    Obesity (BMI 30-39.9)     Solitary nodule of right lobe of thyroid 03/30/2018    Incidental finding on MRI thoracic spine. [place] ordered.     Stroke (CMS [facility])     2002    Tattoo     Left breast, Right shoulder    Thyroid disorder     Thyroid nodule     Uncontrolled type 2 diabetes mellitus, without long-term current use of insulin (CMS [facility])     Xanthelasma      Past surgical  history:  Past Surgical History:   Procedure Laterality Date    ENDOMETRIAL ABLATION  1998    HX ANKLE FRACTURE [place]    Right, Doran Durand procedure    HX CATARACT REMOVAL Bilateral 2013    r and l eyes with implants    HX CESAREAN SECTION  06/20/2000    x3, 11/21/86, 11/10/84    HX CHOLECYSTECTOMY  1988    HX CORONARY ARTERY BYPASS GRAFT  03/31/2011    cardiac, 2 vessel, Dr. [name], [place]  2012    x4 stents    HX GASTRIC SLEEVE  02/2017    [place], [place], Dr. [name] HEART CATHETERIZATION  2012    [facility] - [facility]    HX THYROID BIOPSY      HX THYROIDECTOMY  101/01/2018    HX TONSILLECTOMY      as child    HX YAG Left 09/20/2013     Medications:  Current Outpatient Medications  Medication Sig    ACCU-CHEK AVIVA PLUS TEST STRP Strip USE 1 STRIP TO TEST BLOOD SUGAR THREE TIMES A DAY    Blood-Glucose Meter (ACCU-CHEK AVIVA PLUS METER) Misc 1 Kit by Does not apply route Three times a day    clopidogreL (PLAVIX) 75 mg Oral Tablet TAKE 1 TABLET BY MOUTH ONCE DAILY    cyanocobalamin (VITAMIN B12) 1,000 mcg/mL Injection Solution 1 mL (1,000 mcg total) by Subcutaneous route Every 30 days    diclofenac sodium (VOLTAREN) 1 % Gel 2 g by Apply Topically route Three times a day as needed    docusate sodium (COLACE) 100 mg Oral Capsule Take 1 Cap (100 mg total) by mouth Twice daily    dulaglutide (TRULICITY) 7.56 EP/3.2 mL Subcutaneous Pen Injector 0.5 mL (0.75 mg total) by Subcutaneous route Every 7 days    empagliflozin-linagliptin (GLYXAMBI) 10-5 mg Oral Tablet TAKE 1 TABLET BY MOUTH ONCE DAILY    ergocalciferol, vitamin D2, (DRISDOL) 1,250 mcg (50,000 unit) Oral Capsule TAKE 1 CAPSULE BY MOUTH EVERY WEEK    furosemide (LASIX) 40 mg Oral Tablet TAKE 1 TABLET(40 MG) BY MOUTH EVERY DAY    Ibuprofen (MOTRIN) 800 mg Oral Tablet Take 1 Tab (800 mg total) by mouth Three times a day as needed for Pain    levothyroxine (SYNTHROID) 25 mcg  Oral Tablet Take 1 Tab (25 mcg total) by mouth Once a day    lisinopriL (PRINIVIL) 2.5 mg Oral Tablet TAKE 1 TABLET BY MOUTH EVERY DAY    metFORMIN (GLUCOPHAGE) 500 mg Oral Tablet Take 1 Tablet (500 mg total) by mouth Three times daily with meals take 1 tablet by mouth twice a day with food    methocarbamoL (ROBAXIN) 500 mg Oral Tablet Take 1 Tab (500 mg total) by mouth Three times a day as needed (muscle spasms)    metoprolol succinate (TOPROL-XL) 25 mg Oral Tablet Sustained Release 24 hr Take 0.5 Tabs (12.5 mg total) by mouth Once a day    NARCAN 4 mg/actuation Nasal Spray, Non-Aerosol PT STATED SHE HAS NARCAN AT HOME BUT IS NOT TAKING IT    niacin (NIASPAN) 500 mg Oral Tablet Sustained Release Take 1 Tab (500 mg total) by mouth Once a day    nitroGLYCERIN (NITROSTAT) 0.4 mg Sublingual Tablet, Sublingual 1 Tab (0.4 mg total) by Sublingual route Every 5 minutes as needed for Chest pain for 3 doses over 15 minutes    omeprazole (PRILOSEC) 40 mg Oral Capsule, Delayed Release(E.C.) TAKE 1 CAPSULE BY MOUTH ONCE DAILY    ondansetron (ZOFRAN ODT) 8 mg Oral Tablet, Rapid Dissolve DISSOLVE 1 TABLET(8 MG) ON THE TONGUE EVERY 8 HOURS AS NEEDED FOR NAUSEA OR VOMITING    PRENATAL PLUS, CALCIUM CARB, 27 mg iron- 1 mg Oral Tablet Take 1 Tab by mouth Once a day    PROVENTIL HFA 90 mcg/actuation Inhalation HFA Aerosol Inhaler Take 1-2 Puffs by inhalation Every 4 hours as needed    rosuvastatin (CRESTOR) 20 mg Oral Tablet TAKE 1 TABLET BY MOUTH ONCE DAILY    venlafaxine (EFFEXOR XR) 37.5 mg Oral Capsule, Sust. Release 24 hr TAKE 1 CAPSULE BY MOUTH ONCE DAILY    VENTOLIN HFA 90 mcg/actuation Inhalation HFA Aerosol Inhaler Take 1-2 Puffs by inhalation Every 6 hours as needed    ZYRTEC-D 5-120 mg Oral Tablet Sustained Release 12 hr Take 1 Tab by mouth Once a day     Vitals:    07/04/20 1558   BP: 110/62   Pulse: 57  Resp: 16   Temp: 36.2 C (97.1 F)   SpO2: 99%   Weight: 102 kg (225 lb)   Height: 1.676 m (5' 6" )    BMI: 36.39         Three year weight curve.      Physical Examination:    GENERAL:   Pt is a pleasant, well-nourished, well-developed 54 y.o. female who is in NAD. Appears stated age  30:  head normocephalic, symmetrical facies. EOM intact b/l. PERRLA. Sclera non-icteric, non-injected. upper eyelid w/Xythoma-lipid plaques, diminished in fullness and intensity-in fact nearly resolved..  No rhinorrhea. Oropharyngeal mucous membranes are moist  w/o erythema/exudates   NECK:   No masses, no lymphadenopathy, no JVD on exam. Trachea midline, no thyromegaly   CV: S1, S2. No murmurs, rubs, gallops.     LUNGS: CTAB, No rhonchi, rales, wheezes.   GI: (+) BS in all 4 quadrants. soft, NT/ND. No rigidity/ guarding/ rebound. No organomegaly/masses, or abdominal bruits  MSK: Spontaneous normal AROM of major joints as observed during exam. No joint effusions/ swelling/ deformities.   No BLE edema, clubbing, or cyanosis.   2+ radial pulse present b/l.   + 2 reflexes Brachioradialis and patellar bilaterally.   Gait normal.  SKIN: No significant lesions, rashes, ecchymoses noted.   NEURO: AAOx4, CN grossly intact. Sensation intact b/l. No focal deficits.  PSYCH: Mood, behavior and affect normal w/ Intact judgement & insight    Diabetes Monitors    A1C: 9.9  A1C Date: 07/04/2020         9.6 today.  Nephropathy Screening: On ACEI or ARB  Lab Results   Component Value Date    CHOLESTEROL 134 03/29/2018    HDLCHOL 52 03/29/2018    LDLCHOL 52 (L) 03/29/2018    TRIG 151 03/29/2018        Retinal Exam Date: 02/01/2019  Last diabetic foot exam: 06/01/2019     Visit Diagnosis    Encounter Diagnoses   Name Primary?    Type 2 diabetes mellitus with complication, without long-term current use of insulin (CMS HCC) Yes    Type 2 diabetes mellitus (CMS [facility])      Orders Placed This Encounter    POCT HGB A1C    metFORMIN (GLUCOPHAGE) 500 mg Oral Tablet       Type 2 diabetes mellitus with complication, without long-term current use of insulin  (CMS HCC)  Chronic,improved control  HbA1C now 9.6%.  Down from 9.7.  She will continue work on diet.  Continue to address weight loss which is stalled.  Increased metformin 500 mg to t.i.d..  Stop Trulicity for cost and difficulty with compliance.  Continue Glyxambi 10-5 mg.    Encouraged her to check blood sugars 3-4 times daily.  Discuss renal concerns and need for monitoring.   Labs ordered as above.    Coronary artery disease  Status post CABG  Continue Plavix  Continue beta-blocker  Continue statin.  Continue sublingual nitro p.r.n. for chest pain.  Continue with Cardiology.    Depression, unspecified depression type  Chronic, controlled.  Continue Effexor    Weight loss:  Loss has stalled.   She has previously offered to restart Adipex,   I previously declined.   I offer for her to return to walking.     GERD  Chronic, Uncontrolled problem   Encouraged lifestyle modification to help with acid reflux, including:   Weight loss   Avoid eating for 2-3 hours prior to bedtime. Avoid late night  snacking.    Avoid hot/spicy and acidic foods and overeating.    Remain upright after eating, avoid tight clothing.    Elevate head of bed.   Chew gum or lozenges.   Refrain from alcohol and smoking.   Continue histamine blocker.  Continue  proton pump inhibitor.    Chronic left-sided lower extremity pain  We review her imaging to her satisfaction.  Copies of the results are offered to the patient.  Patient with osteoarthritis  Has comorbid peripheral vascular disease  Will increase her statin follow.  Patient has follow-ups with orthopedics for the hip and knee.  Continue follow-up with Dr. Lorra Hals for low back pain.    We again discussed use of weight loss as a primary method for pain relief.  Continue on muscle relaxer.  Patient avoids nonsteroidals secondary to her bariatric surgery.  Consider new leg pain the result of electrolyte deficiency will check BMP.     Carotid Art Stenosis:   50% stenosis to the right  ICA  100% stenosis to left ICA.  Continue with Cardiology, Interventional Cardiology    Immunization requested:  At the patient's request we drafted a letter to allow her to obtain immunization for COVID as soon as possible.    Return in about 6 months (around 01/04/2021) for In Person Visit, DM.    Cherly Beach, MD  07/04/2020, 15:59    This note was partially generated using M[employer] Fluency Direct system, and there may be some incorrect words, spellings, and punctuation that were not noted in checking the note before saving.

## 2020-07-04 NOTE — Nursing Note (Signed)
07/04/20 1500   A1C   Time Performed 1559   A1C 9.9   References Ranges 4 - 6%

## 2020-07-09 ENCOUNTER — Other Ambulatory Visit (HOSPITAL_BASED_OUTPATIENT_CLINIC_OR_DEPARTMENT_OTHER): Payer: Self-pay | Admitting: Family Medicine

## 2020-07-09 DIAGNOSIS — E118 Type 2 diabetes mellitus with unspecified complications: Secondary | ICD-10-CM

## 2020-07-10 ENCOUNTER — Telehealth (HOSPITAL_BASED_OUTPATIENT_CLINIC_OR_DEPARTMENT_OTHER): Payer: Self-pay | Admitting: Family Medicine

## 2020-07-10 NOTE — Telephone Encounter (Signed)
CALLED [facility] FOR THE SECOND TIME THIS WEEK AND TOLD THEM THAT METFORMIN 500 MG IS FOR 3 TIMES A WEEK. Angelene Giovanni, LPN

## 2020-07-12 ENCOUNTER — Ambulatory Visit: Payer: Commercial Managed Care - PPO | Attending: Family Medicine

## 2020-07-12 ENCOUNTER — Other Ambulatory Visit: Payer: Self-pay

## 2020-07-12 DIAGNOSIS — E118 Type 2 diabetes mellitus with unspecified complications: Secondary | ICD-10-CM

## 2020-07-12 LAB — BASIC METABOLIC PANEL
ANION GAP: 12 mmol/L (ref 4–13)
BUN/CREA RATIO: 9 (ref 6–22)
BUN: 9 mg/dL (ref 8–25)
CALCIUM: 8.4 mg/dL — ABNORMAL LOW (ref 8.5–10.0)
CHLORIDE: 106 mmol/L (ref 96–111)
CO2 TOTAL: 22 mmol/L (ref 22–30)
CREATININE: 1.03 mg/dL (ref 0.60–1.05)
ESTIMATED GFR: 61 mL/min/BSA (ref >=60)
GLUCOSE: 219 mg/dL — ABNORMAL HIGH (ref 65–125)
POTASSIUM: 4.2 mmol/L (ref 3.5–5.1)
SODIUM: 140 mmol/L (ref 136–145)

## 2020-07-12 LAB — LIPID PANEL
CHOL/HDL RATIO: 3
CHOLESTEROL: 131 mg/dL (ref 100–200)
HDL CHOL: 43 mg/dL — ABNORMAL LOW (ref >=50)
LDL CALC: 61 mg/dL (ref <100)
NON-HDL: 88 mg/dL (ref <=190)
TRIGLYCERIDES: 136 mg/dL (ref <150)
VLDL CALC: 27 mg/dL (ref <30)

## 2020-07-12 LAB — MICROALBUMIN URINE, RANDOM
CREATININE RANDOM URINE: 85 mg/dL (ref 50–100)
MICROALBUMIN RANDOM URINE: <0.5 mg/dL
MICROALBUMIN/CREATININE: <0.006 mg/mg (ref <2.000)

## 2020-07-13 NOTE — Progress Notes (Signed)
Please inform the patient that her labs are back.  Her urine showed she was not spilling significant amounts of protein.  This is good news.    Her cholesterol panel showed her total cholesterol was 131 our goal is less than 200. Her bad cholesterol or LDL cholesterol was 61 and our goal is less than 70. So she is at goal in regards to cholesterol.  This is good news.    Her metabolic panel showed her electrolyte levels were normal.  Her kidney function was very good.  Her glucose was slightly elevated.  This is to be expected.    Making no changes to her plan of care.  Will review this together at our next appointment.  TY TH

## 2020-07-13 NOTE — Progress Notes (Signed)
Called and notified pt of results, pt verbally understood.    [name], LPN

## 2020-07-23 ENCOUNTER — Other Ambulatory Visit (HOSPITAL_BASED_OUTPATIENT_CLINIC_OR_DEPARTMENT_OTHER): Payer: Self-pay | Admitting: Family Medicine

## 2020-07-23 DIAGNOSIS — K219 Gastro-esophageal reflux disease without esophagitis: Secondary | ICD-10-CM

## 2020-07-23 NOTE — Telephone Encounter (Signed)
Next9/21/21  Last7/28/21

## 2020-07-24 MED ORDER — OMEPRAZOLE 40 MG CAPSULE,DELAYED RELEASE
DELAYED_RELEASE_CAPSULE | ORAL | 3 refills | Status: DC
Start: 2020-07-24 — End: 2020-10-10

## 2020-07-27 ENCOUNTER — Other Ambulatory Visit: Payer: Self-pay

## 2020-07-27 ENCOUNTER — Ambulatory Visit (INDEPENDENT_AMBULATORY_CARE_PROVIDER_SITE_OTHER): Payer: Commercial Managed Care - PPO | Admitting: PSYCHIATRY AND NEUROLOGY-NEUROLOGY

## 2020-07-27 VITALS — BP 112/68 | Ht 66.0 in | Wt 220.0 lb

## 2020-07-27 DIAGNOSIS — G4733 Obstructive sleep apnea (adult) (pediatric): Secondary | ICD-10-CM

## 2020-07-29 NOTE — Progress Notes (Signed)
[facility]  [address]  [place] [id number]     Consultation    Name: Monique [name] MRN:  [id number]   Date: 07/27/2020 Age: 54 y.o.     Referring Provider:  No ref. provider found  Primary Care Provider: Cherly Beach, MD       Chief Complaint: Sleep Disorder and Snoring    History of Present Illness   Ms. Monique [name] is a 54 y.o. year old female who comes to clinic referred from her pain clinic for evaluation of obstructive sleep apnea in the setting of chronic opiate use.  She takes hydrocodone-acetaminophen 5-325 3 times daily her as well as gabapentin 100 mg 3 times daily for multi focal pain secondary to osteoarthritis and follows at [facility] for this with Charlene Brooke, PA-C.  While she sleeps 5-7 hours a night, she reports today that her sleep is refreshing except when she stays later than her typical bedtime of 11:00 p.m. to 12 midnight.  She denies frequent awakenings during the night.  She awakens at 9-10 in the morning feeling refreshed.  However, she naps 3 times weekly.  She does not fall asleep by accident.  She has not been observed to have apneas.    She recalls that she had a sleep study but does not remember either the date or the location she.  Our clinic records which go back to 03/2001 do not include these.    She denies a history of sleep apnea.  She denies sleepiness as a youth.    Patient Active Problem List    Diagnosis    Fracture of phalanx of finger of right hand    Spinal pain    Type 2 diabetes mellitus with hyperglycemia (CMS HCC)    Upper respiratory infection    Thyroid disorder    UDS: 07/28/19    Sinus congestion    Morbid obesity (CMS HCC)    Type 2 diabetes mellitus, without long-term current use of insulin (CMS HCC)    Coronary artery disease involving native coronary artery of native heart    GERD (gastroesophageal reflux disease)    HTN (hypertension)    Hyperlipidemia    Stroke (CMS HCC)     Constipation    Thyroid nodule    Chest pain    DDD (degenerative disc disease), lumbar    Anxiety    Chronic back pain    Carotid stenosis    BMI 35.0-35.9,adult    H/O complete eye exam    S/P CABG x 2    Xanthelasma    MI (myocardial infarction) (CMS HCC)    Hypercholesterolemia     Past Medical History:   Diagnosis Date    Anxiety     Carotid stenosis     Chronic back pain     Coronary artery disease involving native coronary artery of native heart     DDD (degenerative disc disease), lumbar     "entire spine"    Ear piercing     GERD (gastroesophageal reflux disease)     H/O complete eye exam     2 years, Dr. [name], at [facility]    History of dental examination     dentures upper and lower    HTN     Hypercholesterolemia     Hyperlipidemia     MI (myocardial infarction) (CMS [facility]) 2012    Neck problem     herniated cervical disc    Obesity (BMI  30-39.9)     Solitary nodule of right lobe of thyroid 03/30/2018    Incidental finding on MRI thoracic spine. [place] ordered.     Stroke (CMS [facility])     2002    Tattoo     Left breast, Right shoulder    Thyroid disorder     Thyroid nodule     Uncontrolled type 2 diabetes mellitus, without long-term current use of insulin (CMS [place])     Xanthelasma          Past Surgical History:   Procedure Laterality Date    ENDOMETRIAL ABLATION  1998    HX ANKLE FRACTURE [place]    Right, Doran Durand procedure    HX CATARACT REMOVAL Bilateral 2013    r and l eyes with implants    HX CESAREAN SECTION  06/20/2000    x3, 11/21/86, 11/10/84    HX CHOLECYSTECTOMY  1988    HX CORONARY ARTERY BYPASS GRAFT  03/31/2011    cardiac, 2 vessel, Dr. [name], [facility]    [place]  2012    x4 stents    HX GASTRIC SLEEVE  02/2017    [place], [place], Dr. [name] HEART CATHETERIZATION  2012    massive heart attack and stents - [facility]    HX THYROID BIOPSY      HX THYROIDECTOMY  101/01/2018    HX TONSILLECTOMY      as child    HX YAG Left  09/20/2013         Current Outpatient Medications   Medication Sig    ACCU-CHEK AVIVA PLUS TEST STRP Strip USE 1 STRIP TO TEST BLOOD SUGAR THREE TIMES A DAY    Blood-Glucose Meter (ACCU-CHEK AVIVA PLUS METER) Misc 1 Kit by Does not apply route Three times a day    clopidogreL (PLAVIX) 75 mg Oral Tablet TAKE 1 TABLET BY MOUTH ONCE DAILY    cyanocobalamin (VITAMIN B12) 1,000 mcg/mL Injection Solution 1 mL (1,000 mcg total) by Subcutaneous route Every 30 days    diclofenac sodium (VOLTAREN) 1 % Gel 2 g by Apply Topically route Three times a day as needed    docusate sodium (COLACE) 100 mg Oral Capsule Take 1 Cap (100 mg total) by mouth Twice daily    dulaglutide (TRULICITY) 5.70 VX/7.9 mL Subcutaneous Pen Injector 0.5 mL (0.75 mg total) by Subcutaneous route Every 7 days    empagliflozin-linagliptin (GLYXAMBI) 10-5 mg Oral Tablet TAKE 1 TABLET BY MOUTH ONCE DAILY    ergocalciferol, vitamin D2, (DRISDOL) 1,250 mcg (50,000 unit) Oral Capsule TAKE 1 CAPSULE BY MOUTH EVERY WEEK    furosemide (LASIX) 40 mg Oral Tablet TAKE 1 TABLET(40 MG) BY MOUTH EVERY DAY    Ibuprofen (MOTRIN) 800 mg Oral Tablet Take 1 Tab (800 mg total) by mouth Three times a day as needed for Pain    levothyroxine (SYNTHROID) 25 mcg Oral Tablet Take 1 Tab (25 mcg total) by mouth Once a day    lisinopriL (PRINIVIL) 2.5 mg Oral Tablet TAKE 1 TABLET BY MOUTH EVERY DAY    metFORMIN (GLUCOPHAGE) 500 mg Oral Tablet TAKE 1 TABLET BY MOUTH THREE TIMES DAILY WITH MEALS    methocarbamoL (ROBAXIN) 500 mg Oral Tablet Take 1 Tab (500 mg total) by mouth Three times a day as needed (muscle spasms)    metoprolol succinate (TOPROL-XL) 25 mg Oral Tablet Sustained Release 24 hr Take 0.5 Tabs (12.5 mg total) by mouth Once a day  NARCAN 4 mg/actuation Nasal Spray, Non-Aerosol PT STATED SHE HAS NARCAN AT HOME BUT IS NOT TAKING IT    niacin (NIASPAN) 500 mg Oral Tablet Sustained Release Take 1 Tab (500 mg total) by mouth Once a day    nitroGLYCERIN  (NITROSTAT) 0.4 mg Sublingual Tablet, Sublingual 1 Tab (0.4 mg total) by Sublingual route Every 5 minutes as needed for Chest pain for 3 doses over 15 minutes    omeprazole (PRILOSEC) 40 mg Oral Capsule, Delayed Release(E.C.) TAKE 1 CAPSULE BY MOUTH ONCE DAILY    ondansetron (ZOFRAN ODT) 8 mg Oral Tablet, Rapid Dissolve DISSOLVE 1 TABLET(8 MG) ON THE TONGUE EVERY 8 HOURS AS NEEDED FOR NAUSEA OR VOMITING    PRENATAL PLUS, CALCIUM CARB, 27 mg iron- 1 mg Oral Tablet Take 1 Tab by mouth Once a day    PROVENTIL HFA 90 mcg/actuation Inhalation HFA Aerosol Inhaler Take 1-2 Puffs by inhalation Every 4 hours as needed    rosuvastatin (CRESTOR) 20 mg Oral Tablet TAKE 1 TABLET BY MOUTH ONCE DAILY    venlafaxine (EFFEXOR XR) 37.5 mg Oral Capsule, Sust. Release 24 hr TAKE 1 CAPSULE BY MOUTH ONCE DAILY    VENTOLIN HFA 90 mcg/actuation Inhalation HFA Aerosol Inhaler Take 1-2 Puffs by inhalation Every 6 hours as needed    ZYRTEC-D 5-120 mg Oral Tablet Sustained Release 12 hr Take 1 Tab by mouth Once a day     Allergies   Allergen Reactions    Capron Dm [Pyrilamine-Dextromethorphan]  Other Adverse Reaction (Add comment)     LIGHT HEADED    Tylenol W Codeine [Acetaminophen-Codeine]  Other Adverse Reaction (Add comment)     pt states that one time she had chest pains with this med, but dr. thought it was because she had not eaten     Family Medical History:     Problem Relation (Age of Onset)    Atrial fibrillation Sister    Colon Cancer Father    Diabetes Mother, Maternal Aunt    Heart Disease Sister, Maternal Grandfather    Hypertension (High Blood Pressure) Mother    Lung Cancer Maternal Grandfather    MI <15 years of age Mother            Social History     Tobacco Use    Smoking status: Current Every Day Smoker     Packs/day: 0.50     Years: 20.00     Pack years: 10.00     Types: Cigarettes     Start date: 54    Smokeless tobacco: Never Used   Substance Use Topics    Alcohol use: No      Review of  Systems    General Positive for:    General Negative for:  General Negative for:: fever, weight change, sleepiness or difficulty sleeping, change in appetite, lightheadness, fatigue  Eyes Positive for:    Eyes Negative for:  Eyes Negative for:: trouble with vision, double vision  ENT Positive for:     ENT Negative for  ENT Negative for:: hearing loss, ringing in ears, decreased smell, trouble or pain with swallowing  Cardiovascular Positive for:     Cardiovascular Negative for:  Cardiovascular Negative for:: heart disease, high blood pressure  Respiratory Positive for:     Respiratory Negative for:  Respiratory Negative for:: asthma or lung disease, shortness of breath, coughing, wheezing  Gastrointestinal Positive for:     Gastrointestinal Negative for:  Gastrointestinal Negative for:: acid reflux  Genitourinary Positive for:  Genitourinary Negative for:  Genitourinary Negative for:: difficulty starting stream, pain on urinatio, erectile dysfunction change in libido, incomplete emptying, dribbling, frequency, urgency, frequent urination at night  Musculoskeletal Positive for:  Musculoskeltal Positive for:: joint or extremity pain  Musculoskeletal Negative for:     Endocrine Positive for:  Endocrine Positive for:: diabetes or thyroid conditions  Endocrine Negative for:     Psychological Positive for:     Psychological Negative for:  Psychological Negative for:: anxiety, depression, nervousness, easy irritability, racing thoughts, panic attacks, bipolar     Examination:  BP 112/68    Ht 1.676 m (5' 6")    Wt 99.8 kg (220 lb)    LMP  (LMP Unknown)    BMI 35.51 kg/m       She is awake, alert, and is in no distress.  Interacts appropriately.  Face mask in place.  Oropharynx Mallampati 3 -4. Neck circumference 17 in.  Chest clear to auscultation.  Heart-regular rate rhythm without murmur gallop.  No pitting pretibial edema.    Data reviewed:  Referral information, patient written history, old paper chart with records  from 2002 through late 2011. Epworth Sleepiness Scale Score 4    Assessment and Recommendations  Problem List Items Addressed This Visit     None      Visit Diagnoses     OSA (obstructive sleep apnea)    -  Primary    Relevant Orders    POLYSOMNOGRAPHY - PSG NIGHT 1        While her history is not strongly suggestive of obstructive sleep apnea, opiate use can give rise to it and she has anatomic risk factors (small oropharynx, large neck circumference).  Plan diagnostic PSG, follow-up accordingly.    Etta Quill Elliot Gurney., MD

## 2020-08-07 ENCOUNTER — Other Ambulatory Visit: Payer: Self-pay

## 2020-08-07 ENCOUNTER — Encounter (HOSPITAL_BASED_OUTPATIENT_CLINIC_OR_DEPARTMENT_OTHER): Payer: Self-pay | Admitting: Family

## 2020-08-07 ENCOUNTER — Ambulatory Visit (INDEPENDENT_AMBULATORY_CARE_PROVIDER_SITE_OTHER): Payer: Commercial Managed Care - PPO | Admitting: Family

## 2020-08-07 VITALS — BP 94/58 | HR 59 | Ht 65.8 in | Wt 227.4 lb

## 2020-08-07 DIAGNOSIS — I2510 Atherosclerotic heart disease of native coronary artery without angina pectoris: Secondary | ICD-10-CM

## 2020-08-07 DIAGNOSIS — I10 Essential (primary) hypertension: Secondary | ICD-10-CM

## 2020-08-07 DIAGNOSIS — E7800 Pure hypercholesterolemia, unspecified: Secondary | ICD-10-CM

## 2020-08-07 DIAGNOSIS — Z951 Presence of aortocoronary bypass graft: Secondary | ICD-10-CM

## 2020-08-07 DIAGNOSIS — I6523 Occlusion and stenosis of bilateral carotid arteries: Secondary | ICD-10-CM

## 2020-08-07 DIAGNOSIS — F17200 Nicotine dependence, unspecified, uncomplicated: Secondary | ICD-10-CM

## 2020-08-07 NOTE — Progress Notes (Signed)
CARDIOLOGY, MEDICAL OFFICE BUILDING D  [place]  [place] [id number]     Date:   08/07/2020  Name: Monique [name]  Age: 54 y.o.    REASON FOR VISIT:   Follow Up 6 Months    HISTORY OF PRESENTING ILLNESS:    Monique [name] is a 54 y.o. female with history of coronary artery disease status post CABG in 2012 followed by MI which resulted in PCI to the LIMA to the LAD, left main and ostial circ. She has had gastric sleeve surgery with weight loss of 100lbs. 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  Stress test 11/2019 revealed no evidence of ischemia.  Echocardiogram 11/2019 showed normal LV function and no significant valvular abnormality.  Carotid artery ultrasound 11/2019 showed right internal carotid artery with less than 50% stenosed and no flow was detected in the left ICA.    A phone visit was last completed on 11/23/2019 and she reported doing well at that time from a cardiac standpoint.  She is here today for routine follow-up. She reports that she is doing well. She denies chest pain, shortness of breath, palpitations, syncope, or near syncope. Her blood pressure is generally well controlled. She remains active around her home but does not exercise regularly. Unfortunately she continues to smoke.     Current Outpatient Medications   Medication Sig    ACCU-CHEK AVIVA PLUS TEST STRP Strip USE 1 STRIP TO TEST BLOOD SUGAR THREE TIMES A DAY    Blood-Glucose Meter (ACCU-CHEK AVIVA PLUS METER) Misc 1 Kit by Does not apply route Three times a day    clopidogreL (PLAVIX) 75 mg Oral Tablet TAKE 1 TABLET BY MOUTH ONCE DAILY    cyanocobalamin (VITAMIN B12) 1,000 mcg/mL Injection Solution 1 mL (1,000 mcg total) by Subcutaneous route Every 30 days    diclofenac sodium (VOLTAREN) 1 % Gel 2 g by Apply Topically route Three times a day as needed    docusate sodium (COLACE) 100 mg Oral Capsule Take 1 Cap (100 mg total) by mouth Twice daily     empagliflozin-linagliptin (GLYXAMBI) 10-5 mg Oral Tablet TAKE 1 TABLET BY MOUTH ONCE DAILY    ergocalciferol, vitamin D2, (DRISDOL) 1,250 mcg (50,000 unit) Oral Capsule TAKE 1 CAPSULE BY MOUTH EVERY WEEK    furosemide (LASIX) 40 mg Oral Tablet TAKE 1 TABLET(40 MG) BY MOUTH EVERY DAY    Ibuprofen (MOTRIN) 800 mg Oral Tablet Take 1 Tab (800 mg total) by mouth Three times a day as needed for Pain    levothyroxine (SYNTHROID) 25 mcg Oral Tablet Take 1 Tab (25 mcg total) by mouth Once a day    lisinopriL (PRINIVIL) 2.5 mg Oral Tablet TAKE 1 TABLET BY MOUTH EVERY DAY    metFORMIN (GLUCOPHAGE) 500 mg Oral Tablet TAKE 1 TABLET BY MOUTH THREE TIMES DAILY WITH MEALS    methocarbamoL (ROBAXIN) 500 mg Oral Tablet Take 1 Tab (500 mg total) by mouth Three times a day as needed (muscle spasms) (Patient not taking: Reported on 08/07/2020)    metoprolol succinate (TOPROL-XL) 25 mg Oral Tablet Sustained Release 24 hr Take 0.5 Tabs (12.5 mg total) by mouth Once a day    NARCAN 4 mg/actuation Nasal Spray, Non-Aerosol PT STATED SHE HAS NARCAN AT HOME BUT IS NOT TAKING IT    niacin (NIASPAN) 500 mg Oral Tablet Sustained Release Take 1 Tab (500 mg total) by mouth Once a day  nitroGLYCERIN (NITROSTAT) 0.4 mg Sublingual Tablet, Sublingual 1 Tab (0.4 mg total) by Sublingual route Every 5 minutes as needed for Chest pain for 3 doses over 15 minutes    omeprazole (PRILOSEC) 40 mg Oral Capsule, Delayed Release(E.C.) TAKE 1 CAPSULE BY MOUTH ONCE DAILY    ondansetron (ZOFRAN ODT) 8 mg Oral Tablet, Rapid Dissolve DISSOLVE 1 TABLET(8 MG) ON THE TONGUE EVERY 8 HOURS AS NEEDED FOR NAUSEA OR VOMITING    PRENATAL PLUS, CALCIUM CARB, 27 mg iron- 1 mg Oral Tablet Take 1 Tab by mouth Once a day    PROVENTIL HFA 90 mcg/actuation Inhalation HFA Aerosol Inhaler Take 1-2 Puffs by inhalation Every 4 hours as needed    rosuvastatin (CRESTOR) 20 mg Oral Tablet TAKE 1 TABLET BY MOUTH ONCE DAILY    venlafaxine (EFFEXOR XR) 37.5 mg Oral Capsule, Sust. Release 24  hr TAKE 1 CAPSULE BY MOUTH ONCE DAILY    VENTOLIN HFA 90 mcg/actuation Inhalation HFA Aerosol Inhaler Take 1-2 Puffs by inhalation Every 6 hours as needed    ZYRTEC-D 5-120 mg Oral Tablet Sustained Release 12 hr Take 1 Tab by mouth Once a day     Allergies   Allergen Reactions    Capron Dm [Pyrilamine-Dextromethorphan]  Other Adverse Reaction (Add comment)     LIGHT HEADED    Tylenol W Codeine [Acetaminophen-Codeine]  Other Adverse Reaction (Add comment)     pt states that one time she had chest pains with this med, but dr. thought it was because she had not eaten     Past Medical History:   Diagnosis Date    Anxiety     Carotid stenosis     Chronic back pain     Coronary artery disease involving native coronary artery of native heart     DDD (degenerative disc disease), lumbar     "entire spine"    Ear piercing     GERD (gastroesophageal reflux disease)     H/O complete eye exam     2 years, Dr. [name], at [facility]    History of dental examination     dentures upper and lower    HTN     Hypercholesterolemia     Hyperlipidemia     MI (myocardial infarction) (CMS [facility]) 2012    Neck problem     herniated cervical disc    Obesity (BMI 30-39.9)     Solitary nodule of right lobe of thyroid 03/30/2018    Incidental finding on MRI thoracic spine. [place] ordered.     Stroke (CMS [facility])     2002    Tattoo     Left breast, Right shoulder    Thyroid disorder     Thyroid nodule     Uncontrolled type 2 diabetes mellitus, without long-term current use of insulin (CMS [place])     Xanthelasma          Past Surgical History:   Procedure Laterality Date    ENDOMETRIAL ABLATION  1998    HX ANKLE FRACTURE [place]    Right, Doran Durand procedure    HX CATARACT REMOVAL Bilateral 2013    r and l eyes with implants    HX CESAREAN SECTION  06/20/2000    x3, 11/21/86, 11/10/84    HX CHOLECYSTECTOMY  1988    HX CORONARY ARTERY BYPASS GRAFT  03/31/2011    cardiac, 2 vessel, Dr. [name], [facility]    HX [place]  2012    x4 stents  HX  GASTRIC SLEEVE  02/2017    [place], [place], Dr. [name] HEART CATHETERIZATION  2012    massive heart attack and stents - [facility]    HX THYROID BIOPSY      HX THYROIDECTOMY  101/01/2018    HX TONSILLECTOMY      as child    HX YAG Left 09/20/2013         Family Medical History:       Problem Relation (Age of Onset)    Atrial fibrillation Sister    Colon Cancer Father    Diabetes Mother, Maternal Aunt    Heart Disease Sister, Maternal Grandfather    Hypertension (High Blood Pressure) Mother    Lung Cancer Maternal Grandfather    MI <61 years of age Mother              Social History     Socioeconomic History    Marital status: Married     Spouse name: [name]    Number of children: 3    Years of education: completed 11th grade    Highest education level: Not on file   Occupational History    Occupation: disabled   Tobacco Use    Smoking status: Current Every Day Smoker     Packs/day: 0.50     Years: 20.00     Pack years: 10.00     Types: Cigarettes     Start date: 1981    Smokeless tobacco: Never Used   Brewing technologist Use: Never used   Substance and Sexual Activity    Alcohol use: No    Drug use: No    Sexual activity: Yes     Partners: Male   Other Topics Concern    Routine Exercise No    Ability to Walk 2 Flight of Steps without SOB/CP Yes   Social History Narrative    Living situation: lives at home with husband, [name], and daughter.  1 dog named Duke and 1 cat named Lucky.    Nutrition:  Eats 5 small meals d/t gastric sleeve surgery.     Caffeine use: none, decaf coffee    Exercise: stays busy around house. No formal form of exercise    Seatbelt use: sometimes    Fire extinguishers in the home: yes    Smoke alarms in the home: yes    Carbon monoxide detectors in the home: yes    Nancy Fetter exposure: sunglasses        Monique [name] would like for husband, Becki Mccaskill,  to speak for her in the event she would become incapacitated.    Full code.      Social Determinants of Health     Financial Resource Strain:     Difficulty of  Paying Living Expenses:    Food Insecurity:     Worried About Charity fundraiser in the Last Year:     Arboriculturist in the Last Year:    Transportation Needs:     Film/video editor (Medical):     Lack of Transportation (Non-Medical):    Physical Activity:     Days of Exercise per Week:     Minutes of Exercise per Session:    Stress:     Feeling of Stress :    Intimate Partner Violence:     Fear of Current or Ex-Partner:     Emotionally Abused:     Physically Abused:  Sexually Abused:        REVIEW OF SYSTEMS:  Constitutional: No fevers, chills, malaise or abnormal weight loss.    Respiratory:  See HPI  Cardiovascular:  See HPI  Skin: No rashes, suspicious lesions.  Neurological: No headaches, weakness, paresthesias  Hematological:  No bleeding, spontaneous bruising.  Psychiatric/Behavioral: No confusion, agitation, hallucinations, paranoia, delusions.    All other systems reviewed and are negative except as noted in [place].    PHYSICAL EXAMINATION:     Vitals reviewed. BP (!) 94/58 Comment: RTA   Pulse 59    Ht 1.671 m (5' 5.8")    Wt 103 kg (227 lb 6.4 oz)    LMP  (LMP Unknown)    SpO2 98%    BMI 36.93 kg/m       Vitals Filed         08/07/2020  0850 08/07/2020  0851          BP: 102/56 94/58      Pulse: 59 --              Constitutional: She is oriented to person, place, and time.  She appears obese.  Cardiovascular: Normal rate and rhythm without murmurs, rubs or gallops.  No JVD.   No peripheral edema is noted.  Pulmonary/Chest: Lungs clear without wheezes, rales or rhonchi.  No chest wall tenderness or deformity.   Neurological: Alert and oriented x 4.  Skin: Skin is warm and dry. No rash or suspicious skin lesions noted.  Psychiatric:  Normal mood and affect. Speech is normal and behavior is normal. Judgment and thought content normal. Cognition and memory are normal.    I have reviewed  the following labs:  COMPLETE BLOOD COUNT   Lab Results   Component Value Date    WBC 10.8 01/30/2020    HGB 14.2  01/30/2020    HCT 43.9 01/30/2020    PLTCNT 382 01/30/2020       DIFFERENTIAL  Lab Results   Component Value Date    PMNS 53 08/30/2018    LYMPHOCYTES 33 08/30/2018    MONOCYTES 11 (H) 08/30/2018    EOSINOPHIL 3 08/30/2018    BASOPHILS 1 08/30/2018    BASOPHILS 0.10 08/30/2018    NRBCS 0 04/01/2011    PMNABS 4.60 08/30/2018    LYMPHSABS 2.90 08/30/2018    EOSABS 0.20 08/30/2018    MONOSABS 0.90 (H) 08/30/2018    BASOSABS 0.034 04/01/2011        COMPREHENSIVE METABOLIC PANEL - NON FASTING  Lab Results   Component Value Date    SODIUM 140 07/12/2020    POTASSIUM 4.2 07/12/2020    CHLORIDE 106 07/12/2020    CO2 22 07/12/2020    ANIONGAP 12 07/12/2020    BUN 9 07/12/2020    CREATININE 1.03 07/12/2020    GLUCOSENF 219 (H) 07/12/2020    CALCIUM 8.4 (L) 07/12/2020    ALBUMIN 3.4 (L) 01/30/2020    TOTALPROTEIN 6.2 (L) 01/30/2020    ALKPHOS 102 01/30/2020    AST 21 01/30/2020    ALT 15 01/30/2020       BASIC METABOLIC PANEL  Lab Results   Component Value Date    SODIUM 140 07/12/2020    POTASSIUM 4.2 07/12/2020    CHLORIDE 106 07/12/2020    CO2 22 07/12/2020    ANIONGAP 12 07/12/2020    BUN 9 07/12/2020    CREATININE 1.03 07/12/2020    BUNCRRATIO 9 07/12/2020    GFR 61 07/12/2020  CALCIUM 8.4 (L) 07/12/2020    GLUCOSENF 219 (H) 07/12/2020        Lab Results   Component Value Date    CHOLESTEROL 131 07/12/2020    HDLCHOL 43 (L) 07/12/2020    LDLCHOL 61 07/12/2020    TRIG 136 07/12/2020      MAGNESIUM  Lab Results   Component Value Date    MAGNESIUM 1.6 (L) 01/30/2020       Lab A1C Results:  HEMOGLOBIN A1C   Date Value Ref Range Status   09/29/2018 10.4 (H) <5.7 % Final     Comment:     Hemoglobin A1c Ranges  Non-Diabetic:  <5.7  Increased Risk (pre-diabetic) for impaired glucose tolerance:  5.7 - 6.4  Consistent with diabetes (in not previously established diabetic patient:  > or = 6.5     03/29/2018 9.6 (H) <5.7 % Final     Comment:     Hemoglobin A1c Ranges  Non-Diabetic:  <5.7  Increased Risk (pre-diabetic) for impaired  glucose tolerance:  5.7 - 6.4  Consistent with diabetes (in not previously established diabetic patient:  > or = 6.5         POCT A1C Results:  POCT A1c 03/01/2019 06/01/2019 09/12/2019 05/17/2020 07/04/2020   Time Performed 51600 52140 [id number] - [id number]   A1C 9.4 9.0 9.7 9.6 9.9       Lab Results   Component Value Date    POCTHA1C 9.9 (A) 07/04/2020    POCTHA1C 9.7 (A) 09/12/2019    POCTHA1C 9.0 (A) 06/01/2019    POCTHA1C 9.4 (A) 03/01/2019    POCTHA1C 10.7 (A) 10/05/2018    POCTHA1C 10.7 (A) 10/05/2018       ASSESSMENT:  ENCOUNTER DIAGNOSES     ICD-10-CM   1. Coronary artery disease, angina presence unspecified, unspecified vessel or lesion type, unspecified whether native or transplanted heart  I25.10   2. Hx of CABG  Z95.1   3. Hypertension, unspecified type  I10   4. Hypercholesterolemia  E78.00   5. Bilateral carotid artery stenosis  I65.23   6. Smoking  F17.200       PLAN:    Coronary artery status post CABG- She denies anginal symptoms on today's exam.  Recent stress test revealed no evidence of ischemia. Echocardiogram shows normal LV function with no significant valvular abnormalities. She reports that she has been active without limitation.She will continue Plavix, beta-blocker, and statin. She does have sublingual nitroglycerin for use as needed.     History of carotid artery stenosis-carotid artery duplex on 11/09/2019 revealed less than 50% stenosis to the right internal carotid artery and no flow detected in the left ICA. When compared to carotid artery ultrasound in 2016 no significant changes. She did have carotid artery ultrasound in 2018 that showed less than 50% bilaterally. However, she reports that she has been told for years that her left internal carotid artery was occluded. Upon review of records, the left internal carotid artery does appear to be totally occluded since 2012. Will plan to repeat carotid artery ultrasound in December. She would like referral to vascular for consultation.       Hypertension-controlled per her report. Continue current medical therapy.     Hyperlipidemia-she is tolerating statins. Most recent LDL 61.     Diabetes- Followed by PCP. Most recent hemoglobin A1c was 9.6%. Tight glycemic control encouraged in order to decrease microvascular risk.     Nicotine use-she admits that she smokes less than 1/2 pack per day.  Three to 5 minutes on smoking cessation and treatment modalities discussed with her on today's exam.      Orders Placed This Encounter    Refer to [facility]) Vascular Surgery, [facility] B    PCA - Carotid Artery Duplex       Return in about 6 months (around 02/04/2021) for Dr. Liam Graham.    Electronically signed by     Marcelino Scot, APRN,NP-C  08/07/2020, 09:04    This note may have been partially generated using MModal Fluency Direct system, and there may be some incorrect words, spellings, and punctuation that were not noted in checking the note before saving, though effort was made to avoid such errors.\      I have reviewed the history and physical findings and agree with the above assessment and plan of nurse practitioner clinic note.    Linus Galas, MD  08/08/2020, 09:07

## 2020-08-07 NOTE — Patient Instructions (Addendum)
Continue same meds    Referral to vascular to follow carotid artery stenosis.     Carotid artery ultrasound in December planned.      Monitor blood pressure for goal of 130/80 or less and notify office with persistently elevated blood pressures.     Healthy diet and exercise 20-30 minutes 5 times per week.     Daily weights- if you gain 3 lbs in 24 hours or 5 lbs in 1 week, notify the office.     Sodium restriction, compression stockings, and elevate lower extremities.     Stop smoking.

## 2020-08-13 ENCOUNTER — Other Ambulatory Visit: Payer: Self-pay | Admitting: Gastroenterology

## 2020-08-23 ENCOUNTER — Telehealth (INDEPENDENT_AMBULATORY_CARE_PROVIDER_SITE_OTHER): Payer: Self-pay | Admitting: Surgery

## 2020-08-23 MED ORDER — PANTOPRAZOLE SODIUM 40 MG PO TBEC: 40.0000 mg | DELAYED_RELEASE_TABLET | Freq: Two times a day (BID) | ORAL | 3 refills | Status: DC

## 2020-08-23 NOTE — Telephone Encounter (Signed)
Called patient to confirm upcoming appointment in vascular office and ask COVID-19 screening questions. Patient states she needs to reschedule her appointment due to having a funeral to attend tomorrow.     Providence Crosby, MA

## 2020-08-24 ENCOUNTER — Ambulatory Visit (INDEPENDENT_AMBULATORY_CARE_PROVIDER_SITE_OTHER): Payer: Self-pay | Admitting: Surgery

## 2020-08-28 ENCOUNTER — Ambulatory Visit
Admission: RE | Admit: 2020-08-28 | Discharge: 2020-08-28 | Disposition: A | Discharge status: Home / Self Care | Payer: Commercial Managed Care - PPO | Source: Ambulatory Visit | Attending: Family Medicine | Admitting: Family Medicine

## 2020-08-28 ENCOUNTER — Ambulatory Visit (INDEPENDENT_AMBULATORY_CARE_PROVIDER_SITE_OTHER): Payer: Commercial Managed Care - PPO | Admitting: Family Medicine

## 2020-08-28 ENCOUNTER — Other Ambulatory Visit: Payer: Self-pay

## 2020-08-28 ENCOUNTER — Ambulatory Visit (HOSPITAL_BASED_OUTPATIENT_CLINIC_OR_DEPARTMENT_OTHER): Payer: Commercial Managed Care - PPO

## 2020-08-28 ENCOUNTER — Encounter (HOSPITAL_BASED_OUTPATIENT_CLINIC_OR_DEPARTMENT_OTHER): Payer: Self-pay | Admitting: Family Medicine

## 2020-08-28 VITALS — BP 100/60 | HR 49 | Temp 97.1°F | Resp 14 | Ht 66.5 in | Wt 223.0 lb

## 2020-08-28 DIAGNOSIS — I6523 Occlusion and stenosis of bilateral carotid arteries: Secondary | ICD-10-CM

## 2020-08-28 DIAGNOSIS — Z951 Presence of aortocoronary bypass graft: Secondary | ICD-10-CM

## 2020-08-28 DIAGNOSIS — I739 Peripheral vascular disease, unspecified: Secondary | ICD-10-CM

## 2020-08-28 DIAGNOSIS — Z7902 Long term (current) use of antithrombotics/antiplatelets: Secondary | ICD-10-CM

## 2020-08-28 DIAGNOSIS — F329 Major depressive disorder, single episode, unspecified: Secondary | ICD-10-CM

## 2020-08-28 DIAGNOSIS — S6991XA Unspecified injury of right wrist, hand and finger(s), initial encounter: Secondary | ICD-10-CM

## 2020-08-28 DIAGNOSIS — M1990 Unspecified osteoarthritis, unspecified site: Secondary | ICD-10-CM

## 2020-08-28 DIAGNOSIS — I2510 Atherosclerotic heart disease of native coronary artery without angina pectoris: Secondary | ICD-10-CM

## 2020-08-28 DIAGNOSIS — E119 Type 2 diabetes mellitus without complications: Secondary | ICD-10-CM

## 2020-08-28 DIAGNOSIS — K219 Gastro-esophageal reflux disease without esophagitis: Secondary | ICD-10-CM

## 2020-08-28 DIAGNOSIS — Z7984 Long term (current) use of oral hypoglycemic drugs: Secondary | ICD-10-CM

## 2020-08-28 NOTE — Progress Notes (Signed)
Patient Name: Monique [name]  Date: 08/28/2020  [facility], [place]  [place]  [place] [address]  [phone]  MRN: [id number]  DOB: 1966/02/21    Chief complaint: The patient is a 54 y.o. old female who came in today for Finger Injury.     HPI:    Finger Injury:  Pt states she was doing housework and fell. When she fell she jammed her Right pinky finger into the wood kitchen chair. Pt states she immediately felt sick and immediate swelling. Pt did not seek medical attention. Pt finger is still swollen today. Pt describes her pain as a constant ache and rates this at a 6/10.  Her pain is relieved by the use of controlled meds prescribed for her chronic lumbar pain.  She is wondering if she needs immobilization or referral.    ROS:  Review of systems completed with all positives and pertinent negatives as per HPI. Otherwise, negative.      Past medical history:  Past Medical History:   Diagnosis Date   . Anxiety    . Carotid stenosis    . Chronic back pain    . Coronary artery disease involving native coronary artery of native heart    . DDD (degenerative disc disease), lumbar     "entire spine"   . Ear piercing    . GERD (gastroesophageal reflux disease)    . H/O complete eye exam     2 years, Dr. [name], at [facility]   . History of dental examination     dentures upper and lower   . HTN    . Hypercholesterolemia    . Hyperlipidemia    . MI (myocardial infarction) (CMS [place]) 2012   . Neck problem     herniated cervical disc   . Obesity (BMI 30-39.9)    . 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 [place] ordered.    . Stroke (CMS [facility])     2002   . Tattoo     Left breast, Right shoulder   . Thyroid disorder    . Thyroid nodule    . Uncontrolled type 2 diabetes mellitus, without long-term current use of insulin (CMS [place])    . Xanthelasma      Past surgical history:  Past Surgical History:   Procedure Laterality Date   .  ENDOMETRIAL ABLATION  1998   . HX ANKLE FRACTURE TX  1990s    Right, Doran Durand procedure   . HX CATARACT REMOVAL Bilateral 2013    r and l eyes with implants   . HX CESAREAN SECTION  06/20/2000    x3, 11/21/86, 11/10/84   . HX CHOLECYSTECTOMY  1988   . HX CORONARY ARTERY BYPASS GRAFT  03/31/2011    cardiac, 2 vessel, Dr. [name], [facility]   . HX CORONARY STENT PLACEMENT  2012    x4 stents   . 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 Pearlie Oyster   . HX HEART CATHETERIZATION  2012    massive heart attack and stents - [facility]   . HX THYROID BIOPSY     . HX THYROIDECTOMY  101/01/2018   . HX TONSILLECTOMY      as child   . HX YAG Left 09/20/2013     Medications:  Current Outpatient Medications   Medication Sig   . ACCU-CHEK AVIVA PLUS TEST STRP  Strip USE 1 STRIP TO TEST BLOOD SUGAR THREE TIMES A DAY   . Blood-Glucose Meter (ACCU-CHEK AVIVA PLUS METER) Misc 1 Kit by Does not apply route Three times a day   . clopidogreL (PLAVIX) 75 mg Oral Tablet TAKE 1 TABLET BY MOUTH ONCE DAILY   . cyanocobalamin (VITAMIN B12) 1,000 mcg/mL Injection Solution 1 mL (1,000 mcg total) by Subcutaneous route Every 30 days   . diclofenac sodium (VOLTAREN) 1 % Gel 2 g by Apply Topically route Three times a day as needed   . docusate sodium (COLACE) 100 mg Oral Capsule Take 1 Cap (100 mg total) by mouth Twice daily   . empagliflozin-linagliptin (GLYXAMBI) 10-5 mg Oral Tablet TAKE 1 TABLET BY MOUTH ONCE DAILY   . ergocalciferol, vitamin D2, (DRISDOL) 1,250 mcg (50,000 unit) Oral Capsule TAKE 1 CAPSULE BY MOUTH EVERY WEEK   . furosemide (LASIX) 40 mg Oral Tablet TAKE 1 TABLET(40 MG) BY MOUTH EVERY DAY   . Ibuprofen (MOTRIN) 800 mg Oral Tablet Take 1 Tab (800 mg total) by mouth Three times a day as needed for Pain   . levothyroxine (SYNTHROID) 25 mcg Oral Tablet Take 1 Tab (25 mcg total) by mouth Once a day   . lisinopriL (PRINIVIL) 2.5 mg Oral Tablet TAKE 1 TABLET BY MOUTH EVERY DAY   . metFORMIN (GLUCOPHAGE) 500 mg Oral Tablet TAKE 1  TABLET BY MOUTH THREE TIMES DAILY WITH MEALS   . metoprolol succinate (TOPROL-XL) 25 mg Oral Tablet Sustained Release 24 hr Take 0.5 Tabs (12.5 mg total) by mouth Once a day   . NARCAN 4 mg/actuation Nasal Spray, Non-Aerosol PT STATED SHE HAS NARCAN AT HOME BUT IS NOT TAKING IT   . niacin (NIASPAN) 500 mg Oral Tablet Sustained Release Take 1 Tab (500 mg total) by mouth Once a day   . nitroGLYCERIN (NITROSTAT) 0.4 mg Sublingual Tablet, Sublingual 1 Tab (0.4 mg total) by Sublingual route Every 5 minutes as needed for Chest pain for 3 doses over 15 minutes   . omeprazole (PRILOSEC) 40 mg Oral Capsule, Delayed Release(E.C.) TAKE 1 CAPSULE BY MOUTH ONCE DAILY   . ondansetron (ZOFRAN ODT) 8 mg Oral Tablet, Rapid Dissolve DISSOLVE 1 TABLET(8 MG) ON THE TONGUE EVERY 8 HOURS AS NEEDED FOR NAUSEA OR VOMITING   . PRENATAL PLUS, CALCIUM CARB, 27 mg iron- 1 mg Oral Tablet Take 1 Tab by mouth Once a day   . PROVENTIL HFA 90 mcg/actuation Inhalation HFA Aerosol Inhaler Take 1-2 Puffs by inhalation Every 4 hours as needed   . rosuvastatin (CRESTOR) 20 mg Oral Tablet TAKE 1 TABLET BY MOUTH ONCE DAILY   . venlafaxine (EFFEXOR XR) 37.5 mg Oral Capsule, Sust. Release 24 hr TAKE 1 CAPSULE BY MOUTH ONCE DAILY   . VENTOLIN HFA 90 mcg/actuation Inhalation HFA Aerosol Inhaler Take 1-2 Puffs by inhalation Every 6 hours as needed   . ZYRTEC-D 5-120 mg Oral Tablet Sustained Release 12 hr Take 1 Tab by mouth Once a day     Vitals:    08/28/20 1013   BP: 100/60   Pulse: 49   Resp: 14   Temp: 36.2 C (97.1 F)   SpO2: 99%   Weight: 101 kg (223 lb)   Height: 1.689 m (5' 6.5")   BMI: 35.53         Three year weight curve.      Physical Examination:    GENERAL:   Pt is a pleasant, well-nourished, well-developed 54 y.o. female who is in  NAD. Appears stated age  7:  head normocephalic, symmetrical facies. EOM intact b/l. PERRLA. Sclera non-icteric, non-injected. upper eyelid w/Xythoma-lipid plaques, diminished in fullness and intensity-in fact  nearly resolved..  No rhinorrhea. Oropharyngeal mucous membranes are moist  w/o erythema/exudates   NECK:   No masses, no lymphadenopathy, no JVD on exam. Trachea midline, no thyromegaly   CV: S1, S2. No murmurs, rubs, gallops.     LUNGS: CTAB, No rhonchi, rales, wheezes.   GI: (+) BS in all 4 quadrants. soft, NT/ND. No rigidity/ guarding/ rebound. No organomegaly/masses, or abdominal bruits  MSK: Spontaneous normal AROM of major joints as observed during exam. No joint effusions/ swelling/ deformities.   Right 5th digit with no gross deformity.  Obvious bruising with swelling is appreciated about the proximal aspect of the digit.  Tenderness on palpation without gross defect.  Range of motion limited by guarding and pain.  Distal cap refill brisk.  Radial pulse +2 and synchronous.  No BLE edema, clubbing, or cyanosis.   + 2 reflexes Brachioradialis and patellar bilaterally.   Gait normal.  SKIN: No significant lesions, rashes, ecchymoses noted.   NEURO: AAOx4, CN grossly intact. Sensation intact b/l. No focal deficits.  PSYCH: Mood, behavior and affect normal w/ Intact judgement & insight      Diabetes Monitors    A1C: 9.9  A1C Date: 07/04/2020         9.6 today.  Nephropathy Screening: On ACEI or ARB  Lab Results   Component Value Date    CHOLESTEROL 131 07/12/2020    HDLCHOL 43 (L) 07/12/2020    LDLCHOL 61 07/12/2020    TRIG 136 07/12/2020    Retinal Exam Date: 02/01/2019  Last diabetic foot exam: 06/01/2019     Visit Diagnosis    Encounter Diagnoses   Name Primary?   . Injury of finger of right hand, initial encounter Yes     Orders Placed This Encounter   . XR FINGER, 5TH/PINKIE RIGHT       Right 5th digit sprain:  Images pending.  Will refer to orthopedist if there is joint involvement.  Concern that she has lost extension of the distal digit.  Declines physical therapy referral at this time.  Discussed immobilization with a finger splint.  Discussed cautious use of nonsteroidals given her history of gastric  sleeve.  Will follow.    Other chronic conditions reviewed but not specifically addressed:    Type 2 diabetes mellitus with complication, without long-term current use of insulin (CMS [facility])  Chronic,improved control  HbA1C now 9.6%.  Down from 9.7.  She will continue work on diet.  Continue to address weight loss which is stalled.  Increased metformin 500 mg to t.i.d..  Stop Trulicity for cost and difficulty with compliance.  Continue Glyxambi 10-5 mg.    Encouraged her to check blood sugars 3-4 times daily.  Discuss renal concerns and need for monitoring.   Labs ordered as above.    Coronary artery disease  Status post CABG  Continue Plavix  Continue beta-blocker  Continue statin.  Continue sublingual nitro p.r.n. for chest pain.  Continue with Cardiology.    Depression, unspecified depression type  Chronic, controlled.  Continue Effexor    Weight loss:  Loss has stalled.   She has previously offered to restart Adipex,   I previously declined.   I offer for her to return to walking.   I offer nutritional referral.    GERD  Chronic, Uncontrolled problem   Encouraged  lifestyle modification to help with acid reflux, including:   Weight loss   Avoid eating for 2-3 hours prior to bedtime. Avoid late night snacking.    Avoid hot/spicy and acidic foods and overeating.    Remain upright after eating, avoid tight clothing.    Elevate head of bed.   Chew gum or lozenges.   Refrain from alcohol and smoking.   Continue histamine blocker.  Continue  proton pump inhibitor.    Chronic left-sided lower extremity pain  We review her imaging to her satisfaction.  Copies of the results are offered to the patient.  Patient with osteoarthritis  Has comorbid peripheral vascular disease  Will increase her statin follow.  Patient has follow-ups with orthopedics for the hip and knee.  Continue follow-up with Dr. Lorra Hals for low back pain.    We again discussed use of weight loss as a primary method for pain relief.  Continue on  muscle relaxer.  Patient avoids nonsteroidals secondary to her bariatric surgery.  Consider new leg pain the result of electrolyte deficiency will check BMP.     Carotid Art Stenosis:   50% stenosis to the right ICA  100% stenosis to left ICA.  Continue with Cardiology, Interventional Cardiology    Immunization requested:  At the patient's request we drafted a letter to allow her to obtain immunization for COVID as soon as possible.    Return in about 6 weeks (around 10/09/2020), or if symptoms worsen or fail to improve, for In Person Visit, diabetes, weight loss.Cherly Beach, MD  08/28/2020, 10:26    This note was partially generated using MModal Fluency Direct system, and there may be some incorrect words, spellings, and punctuation that were not noted in checking the note before saving.

## 2020-08-29 NOTE — Progress Notes (Signed)
Please inform the patient that there was a small nondisplaced fracture of the tip of her pinky finger.    Because she is having difficulty extending her distal finger I will refer her to Orthopedics for consult.    As always we will review this at our next encounter.  TY TH

## 2020-09-07 ENCOUNTER — Telehealth (HOSPITAL_BASED_OUTPATIENT_CLINIC_OR_DEPARTMENT_OTHER): Payer: Self-pay | Admitting: Family Medicine

## 2020-09-07 DIAGNOSIS — T148XXA Other injury of unspecified body region, initial encounter: Secondary | ICD-10-CM

## 2020-09-07 NOTE — Telephone Encounter (Signed)
Message from Lacie Draft sent at 09/06/2020 3:26 PM EDT    Summary: Dr Russ Halo     We did an xray we called with results and said it was fractured and might need pinned. Someone was suppose to call her back with more info about it or the referral and she hasn't heard anything and she said its getting worst .               Progress note states:  Because she is having difficulty extending her distal finger I will refer her to Orthopedics for consult.    Order placed. Angelene Giovanni, LPN

## 2020-09-10 ENCOUNTER — Other Ambulatory Visit (HOSPITAL_BASED_OUTPATIENT_CLINIC_OR_DEPARTMENT_OTHER): Payer: Self-pay | Admitting: Family Medicine

## 2020-09-10 ENCOUNTER — Ambulatory Visit (INDEPENDENT_AMBULATORY_CARE_PROVIDER_SITE_OTHER): Payer: Commercial Managed Care - PPO | Admitting: Rehabilitative and Restorative Service Providers"

## 2020-09-10 ENCOUNTER — Encounter (INDEPENDENT_AMBULATORY_CARE_PROVIDER_SITE_OTHER): Payer: Self-pay | Admitting: Rehabilitative and Restorative Service Providers"

## 2020-09-10 ENCOUNTER — Other Ambulatory Visit: Payer: Self-pay

## 2020-09-10 DIAGNOSIS — T148XXA Other injury of unspecified body region, initial encounter: Secondary | ICD-10-CM

## 2020-09-10 DIAGNOSIS — W0110XD Fall on same level from slipping, tripping and stumbling with subsequent striking against unspecified object, subsequent encounter: Secondary | ICD-10-CM

## 2020-09-10 DIAGNOSIS — S62636D Displaced fracture of distal phalanx of right little finger, subsequent encounter for fracture with routine healing: Secondary | ICD-10-CM

## 2020-09-10 DIAGNOSIS — I70221 Atherosclerosis of native arteries of extremities with rest pain, right leg: Secondary | ICD-10-CM

## 2020-09-11 NOTE — Progress Notes (Signed)
[facility]  [address]  [place] [id number]    Progress Note    Name: Monique [name] MRN:  [id number]   Date: 09/10/2020 Age: 54 y.o.      Chief Complaint: Fracture (right small finger fx; xr on 08/28/20; tripped and fell and hit directly on her pinky;  happend two and a half weeks ago;  swollen and sore.  )    HPI: Monique [name] is a 53 y.o. female presenting for evaluation of a right little finger fracture.  Date of injury was 3 weeks ago when she tripped and fell hitting her pinky finger.  Today she states that her pain is improving.  She still reports mild swelling and tenderness at the DIP joint.  She describes the pain as sharp, and is located at the DIP joint of the right little finger.  She denies any radiating pain, numbness, tingling in the right hand.  Her pain is intermittent, and rated an 8/10 when it occurs.  Her pain is worsened with movement or trying to grip objects.  She states that she is taking Norco 10 with some relief in her symptoms.  She denies any other complaints at this time.    Medical History     Past Medical History  Current Outpatient Medications   Medication Sig   . ACCU-CHEK AVIVA PLUS TEST STRP Strip USE 1 STRIP TO TEST BLOOD SUGAR THREE TIMES A DAY   . Blood-Glucose Meter (ACCU-CHEK AVIVA PLUS METER) Misc 1 Kit by Does not apply route Three times a day   . clopidogreL (PLAVIX) 75 mg Oral Tablet TAKE 1 TABLET BY MOUTH ONCE DAILY   . cyanocobalamin (VITAMIN B12) 1,000 mcg/mL Injection Solution 1 mL (1,000 mcg total) by Subcutaneous route Every 30 days   . diclofenac sodium (VOLTAREN) 1 % Gel 2 g by Apply Topically route Three times a day as needed   . docusate sodium (COLACE) 100 mg Oral Capsule Take 1 Cap (100 mg total) by mouth Twice daily   . empagliflozin-linagliptin (GLYXAMBI) 10-5 mg Oral Tablet TAKE 1 TABLET BY MOUTH ONCE DAILY   . ergocalciferol, vitamin D2, (DRISDOL) 1,250 mcg (50,000 unit) Oral Capsule TAKE 1 CAPSULE BY MOUTH  EVERY WEEK   . furosemide (LASIX) 40 mg Oral Tablet TAKE 1 TABLET(40 MG) BY MOUTH EVERY DAY   . Ibuprofen (MOTRIN) 800 mg Oral Tablet Take 1 Tab (800 mg total) by mouth Three times a day as needed for Pain   . levothyroxine (SYNTHROID) 25 mcg Oral Tablet Take 1 Tab (25 mcg total) by mouth Once a day   . lisinopriL (PRINIVIL) 2.5 mg Oral Tablet TAKE 1 TABLET BY MOUTH EVERY DAY   . metFORMIN (GLUCOPHAGE) 500 mg Oral Tablet TAKE 1 TABLET BY MOUTH THREE TIMES DAILY WITH MEALS   . metoprolol succinate (TOPROL-XL) 25 mg Oral Tablet Sustained Release 24 hr Take 0.5 Tabs (12.5 mg total) by mouth Once a day   . NARCAN 4 mg/actuation Nasal Spray, Non-Aerosol PT STATED SHE HAS NARCAN AT HOME BUT IS NOT TAKING IT   . niacin (NIASPAN) 500 mg Oral Tablet Sustained Release Take 1 Tab (500 mg total) by mouth Once a day   . nitroGLYCERIN (NITROSTAT) 0.4 mg Sublingual Tablet, Sublingual 1 Tab (0.4 mg total) by Sublingual route Every 5 minutes as needed for Chest pain for 3 doses over 15 minutes   . omeprazole (PRILOSEC) 40 mg Oral Capsule, Delayed Release(E.C.) TAKE 1 CAPSULE BY MOUTH ONCE  DAILY   . ondansetron (ZOFRAN ODT) 8 mg Oral Tablet, Rapid Dissolve DISSOLVE 1 TABLET(8 MG) ON THE TONGUE EVERY 8 HOURS AS NEEDED FOR NAUSEA OR VOMITING   . PRENATAL PLUS, CALCIUM CARB, 27 mg iron- 1 mg Oral Tablet Take 1 Tab by mouth Once a day   . PROVENTIL HFA 90 mcg/actuation Inhalation HFA Aerosol Inhaler Take 1-2 Puffs by inhalation Every 4 hours as needed   . rosuvastatin (CRESTOR) 20 mg Oral Tablet TAKE 1 TABLET BY MOUTH ONCE DAILY   . venlafaxine (EFFEXOR XR) 37.5 mg Oral Capsule, Sust. Release 24 hr TAKE 1 CAPSULE BY MOUTH ONCE DAILY   . VENTOLIN HFA 90 mcg/actuation Inhalation HFA Aerosol Inhaler Take 1-2 Puffs by inhalation Every 6 hours as needed   . ZYRTEC-D 5-120 mg Oral Tablet Sustained Release 12 hr Take 1 Tab by mouth Once a day     Allergies   Allergen Reactions   . Ane Payment [Pyrilamine-Dextromethorphan]  Other Adverse Reaction  (Add comment)     LIGHT HEADED   . Tylenol W Codeine [Acetaminophen-Codeine]  Other Adverse Reaction (Add comment)     pt states that one time she had chest pains with this med, but dr. thought it was because she had not eaten     Past Medical History:   Diagnosis Date   . Anxiety    . Carotid stenosis    . Chronic back pain    . Coronary artery disease involving native coronary artery of native heart    . DDD (degenerative disc disease), lumbar     "entire spine"   . Ear piercing    . GERD (gastroesophageal reflux disease)    . H/O complete eye exam     2 years, Dr. [name], at [facility]   . History of dental examination     dentures upper and lower   . HTN    . Hypercholesterolemia    . Hyperlipidemia    . MI (myocardial infarction) (CMS [place]) 2012   . Neck problem     herniated cervical disc   . Obesity (BMI 30-39.9)    . 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 [place] ordered.    . Stroke (CMS [facility])     2002   . Tattoo     Left breast, Right shoulder   . Thyroid disorder    . Thyroid nodule    . Uncontrolled type 2 diabetes mellitus, without long-term current use of insulin (CMS [place])    . Xanthelasma          Past Surgical History:   Procedure Laterality Date   . AUTOLOGOUS BLOOD CONSERVATION SETUP N/A 03/31/2011    Performed by Sydnee Levans, MD at [place]   . AUTOLOGOUS BLOOD CONSERVATION, MONITORING PER HOUR N/A 03/31/2011    Performed by Sydnee Levans, MD at [place]   . BYPASS GRAFT CORONARY ARTERY N/A 03/31/2011    Performed by Sydnee Levans, MD at [place]   . HARVEST VEIN SAPHENOUS ENDOSCOPIC Right 03/31/2011    Performed by Sydnee Levans, MD at [place]   . HX ANKLE FRACTURE TX  1990s    Right, Doran Durand procedure   . HX CATARACT REMOVAL Bilateral 2013    r and l eyes with implants   . HX CESAREAN SECTION  06/20/2000    x3, 11/21/86, 11/10/84   . HX CHOLECYSTECTOMY  1988    . HX CORONARY ARTERY BYPASS GRAFT  03/31/2011    cardiac, 2 vessel, Dr. [name], [facility]   . HX CORONARY STENT PLACEMENT  2012    x4 stents   . HX GASTRIC SLEEVE  02/2017    [place], [place], Dr. Pearlie Oyster   . HX HEART CATHETERIZATION  2012    massive heart attack and stents - [facility]   . HX THYROID BIOPSY     . HX THYROIDECTOMY  101/01/2018   . HX TONSILLECTOMY      as child   . HX YAG Left 09/20/2013   . PERFUSION CHARGE/STBY/CARDIOPULMONARY BYPASS N/A 03/31/2011    Performed by Sydnee Levans, MD at [place]   . Right Thyroid Lobectomy N/A 09/07/2018    Performed by Drue Novel, MD at [place] History:     Problem Relation (Age of Onset)    Atrial fibrillation Sister    Colon Cancer Father    Diabetes Mother, Maternal Aunt    Heart Disease Sister, Maternal Grandfather    Hypertension (High Blood Pressure) Mother    Lung Cancer Maternal Grandfather    MI <29 years of age Mother            Social History     Socioeconomic History   . Marital status: Married     Spouse name: [name]   . Number of children: 3   . Years of education: completed 11th grade   . Highest education level: Not on file   Occupational History   . Occupation: disabled   Tobacco Use   . Smoking status: Current Every Day Smoker     Packs/day: 0.50     Years: 20.00     Pack years: 10.00     Types: Cigarettes     Start date: 4   . Smokeless tobacco: Never Used   Vaping Use   . Vaping Use: Never used   Substance and Sexual Activity   . Alcohol use: No   . Drug use: No   . Sexual activity: Yes     Partners: Male   Other Topics Concern   . Routine Exercise No   . Ability to Walk 2 Flight of Steps without SOB/CP Yes   Social History Narrative    Living situation: lives at home with husband, [name], and daughter.  1 dog named Duke and 1 cat named Lucky.    Nutrition:  Eats 5 small meals d/t gastric sleeve surgery.     Caffeine use: none, decaf coffee    Exercise: stays busy around house. No formal form of exercise     Seatbelt use: sometimes    Fire extinguishers in the home: yes    Smoke alarms in the home: yes    Carbon monoxide detectors in the home: yes    Nancy Fetter exposure: sunglasses        Raigan would like for husband, Kelis Plasse,  to speak for her in the event she would become incapacitated.    Full code.      Social Determinants of Health     Financial Resource Strain:    . Difficulty of Paying Living Expenses:    Food Insecurity:    . Worried About Charity fundraiser in the Last Year:    . [place] in the Last Year:  Transportation Needs:    . Film/video editor (Medical):    Marland Kitchen Lack of Transportation (Non-Medical):    Physical Activity:    . Days of Exercise per Week:    . Minutes of Exercise per Session:    Stress:    . Feeling of Stress :    Intimate Partner Violence:    . Fear of Current or Ex-Partner:    . Emotionally Abused:    Marland Kitchen Physically Abused:    . Sexually Abused:        Objective   Review of Systems:  Constitutional: no fever, chills, or unexpected weight loss or weight gain   HEENT: no vision problem, earaches or sore throat  Respiratory: no cough or wheeze  Cardiovascular: no chest pain, palpitations or edema  Gastrointestinal: no abdominal pain, nausea, vomiting or diarrhea   Neurological: no headaches or weakness  Psych: no anxiety or depression  All other ROS Negative    Physical Exam:  Ht 1.676 m (_0 )   Wt 101 kg (223 lb)   LMP  (LMP Unknown)   BMI 35.99 kg/m     GEN:   NAD  EYES: Sclera white.  NECK:   Full ROM without pain or neurologic changes.  Trachea midline.  CV:   PT and DP pulses present bilaterally.  PULM:   Normal respiratory effort.    MS:  Evaluation of the right pinky finger reveals tenderness to palpation of the DIP joint.  Range of motion limited secondary to pain.  Full active passive range of motion of the right wrist.  Neurovascularly intact distally with brisk capillary refill.  No malrotation or alignment of the right little finger.  NEURO:   Alert and oriented  to person, place and time.    PSYCHOSOCIAL:   Pleasant.  Normal affect.    WOUND/INCISION:   No wounds present.    Laboratory Studies/Data Reviewed   I have reviewed all available imagining and laboratory studies as appropriate.  No visits with results within 1 Month(s) from this visit.   Latest known visit with results is:   Appointment on 07/12/2020   Component Date Value Ref Range Status   . CHOLESTEROL  07/12/2020 131  100 - 200 mg/dL Final   . HDL CHOL 07/12/2020 43* >=50 mg/dL Final   . TRIGLYCERIDES 07/12/2020 136  <150 mg/dL Final   . LDL CALC 07/12/2020 61  <100 mg/dL Final    <100 mg/dL, Optimal  100-129 mg/dL, Near/Above Optimal  130-159 mg/dL, Borderline High  160-189 mg/dL, High  >=190 mg/dL, Very high   . VLDL CALC 07/12/2020 27  <30 mg/dL Final   . NON-HDL 07/12/2020 88  <=190 mg/dL Final   . CHOL/HDL RATIO 07/12/2020 3.0   Final   . SODIUM 07/12/2020 140  136 - 145 mmol/L Final   . POTASSIUM 07/12/2020 4.2  3.5 - 5.1 mmol/L Final   . CHLORIDE 07/12/2020 106  96 - 111 mmol/L Final   . CO2 TOTAL 07/12/2020 22  22 - 30 mmol/L Final   . ANION GAP 07/12/2020 12  4 - 13 mmol/L Final   . CALCIUM 07/12/2020 8.4* 8.5 - 10.0 mg/dL Final   . GLUCOSE 07/12/2020 219* 65 - 125 mg/dL Final   . BUN 07/12/2020 9  8 - 25 mg/dL Final   . CREATININE 07/12/2020 1.03  0.60 - 1.05 mg/dL Final   . BUN/CREA RATIO 07/12/2020 9  6 - 22 Final   . ESTIMATED GFR 07/12/2020 61  >=  60 mL/min/BSA Final    Estimated Glomerular Filtration Rate (eGFR) calculated using the CKD-EPI (2009) equation, intended for patients 52 years of age and older. If race and/or gender is not documented or "unknown," there will be no eGFR calculation.  Stage, GFR, Classification  G1, 90, Normal or High  G2, 60-89, Mildly decreased  G3a, 45-59, Mildly to moderately decreased  G3b, 30-44, Moderately to severely decreased  G4, 15-29, Severely decreased  G5, <15, Kidney failure  In the absence of kidney damage, neither G1 or G2 fulfill criteria for CKD per  KDIGO.   Marland Kitchen MICROALBUMIN RANDOM URINE 07/12/2020 <0.5  No reference intervals are established mg/dL Final   . CREATININE RANDOM URINE 07/12/2020 85  50 - 100 mg/dL Final   . MICROALBUMIN/CREATININE 07/12/2020 <0.006  <2.000 mg/mg Final    Calculation not performed     Imaging:  No new imaging from today.  X-rays from 08/28/2020 reviewed and show a questionable subtle fracture at the base of the distal phalanx of the right little finger.    Assessment/Plan   Diagnosis:    ICD-10-CM    1. Fracture  T14.8XXA Refer to CCM Ronney Lion) Orthopa[facility]ics, [facility]     Plan:  X-ray findings reviewed discussed patient today.  We discussed that since has been so long since her date of injury her fracture is stable.  At this time would recommend continuing ibuprofen or Tylenol as needed for pain, buddy taping, ice, activity modification it should improve with time.  She can call the office in 3 weeks to schedule follow-up appointment if she feels 1 is necessary if her symptoms are failing to improve or worsening.  She can begin working on range of motion once she is pain-free.  Patient voiced understanding, is agreeable with treatment plan.  She denies any further questions at this time.    Return if symptoms worsen or fail to improve.  Montez Hageman, PA-C      I am seeing this patient independently with co-signing supervising physician present in clinic.        This note was partially generated using MModal Fluency Direct system, and there may be some incorrect words, spellings, and punctuation that were not noted in checking the note before saving

## 2020-09-13 ENCOUNTER — Telehealth (INDEPENDENT_AMBULATORY_CARE_PROVIDER_SITE_OTHER): Payer: Self-pay | Admitting: Surgery

## 2020-09-13 NOTE — Telephone Encounter (Signed)
Called patient to confirm upcoming appointment in the vascular office and ask COVID-19 screening questions. Patient states she wants to cancel the appointment. She states she will be with her mother-in-law at the same tomorrow for an appointment and cannot be here due to the conflicting times. Asked if she would like to reschedule, she states she will call back to reschedule at a later date.     Providence Crosby, MA

## 2020-09-14 ENCOUNTER — Ambulatory Visit (INDEPENDENT_AMBULATORY_CARE_PROVIDER_SITE_OTHER): Payer: Commercial Managed Care - PPO | Admitting: Surgery

## 2020-10-02 ENCOUNTER — Encounter (HOSPITAL_BASED_OUTPATIENT_CLINIC_OR_DEPARTMENT_OTHER): Payer: Self-pay | Admitting: Family Medicine

## 2020-10-02 ENCOUNTER — Ambulatory Visit (INDEPENDENT_AMBULATORY_CARE_PROVIDER_SITE_OTHER): Payer: Commercial Managed Care - PPO | Admitting: Family Medicine

## 2020-10-02 ENCOUNTER — Other Ambulatory Visit: Payer: Self-pay

## 2020-10-02 ENCOUNTER — Other Ambulatory Visit (HOSPITAL_BASED_OUTPATIENT_CLINIC_OR_DEPARTMENT_OTHER): Payer: Self-pay | Admitting: Family Medicine

## 2020-10-02 VITALS — BP 128/68 | HR 65 | Resp 16 | Ht 66.0 in | Wt 208.0 lb

## 2020-10-02 DIAGNOSIS — R52 Pain, unspecified: Secondary | ICD-10-CM

## 2020-10-02 DIAGNOSIS — D2239 Melanocytic nevi of other parts of face: Secondary | ICD-10-CM

## 2020-10-02 DIAGNOSIS — I6523 Occlusion and stenosis of bilateral carotid arteries: Secondary | ICD-10-CM

## 2020-10-02 DIAGNOSIS — L989 Disorder of the skin and subcutaneous tissue, unspecified: Secondary | ICD-10-CM

## 2020-10-02 DIAGNOSIS — I2510 Atherosclerotic heart disease of native coronary artery without angina pectoris: Secondary | ICD-10-CM

## 2020-10-02 DIAGNOSIS — K219 Gastro-esophageal reflux disease without esophagitis: Secondary | ICD-10-CM

## 2020-10-02 DIAGNOSIS — Z6833 Body mass index (BMI) 33.0-33.9, adult: Secondary | ICD-10-CM

## 2020-10-02 DIAGNOSIS — Z23 Encounter for immunization: Secondary | ICD-10-CM

## 2020-10-02 DIAGNOSIS — S62306A Unspecified fracture of fifth metacarpal bone, right hand, initial encounter for closed fracture: Secondary | ICD-10-CM

## 2020-10-02 DIAGNOSIS — E119 Type 2 diabetes mellitus without complications: Secondary | ICD-10-CM

## 2020-10-02 DIAGNOSIS — R634 Abnormal weight loss: Secondary | ICD-10-CM

## 2020-10-02 DIAGNOSIS — M79662 Pain in left lower leg: Secondary | ICD-10-CM

## 2020-10-02 DIAGNOSIS — F339 Major depressive disorder, recurrent, unspecified: Secondary | ICD-10-CM

## 2020-10-02 DIAGNOSIS — Z951 Presence of aortocoronary bypass graft: Secondary | ICD-10-CM

## 2020-10-02 MED ORDER — IBUPROFEN 800 MG TABLET
800.0000 mg | ORAL_TABLET | Freq: Three times a day (TID) | ORAL | 2 refills | Status: DC | PRN
Start: 2020-10-02 — End: 2020-12-25

## 2020-10-02 NOTE — Progress Notes (Signed)
Patient Name: Monique [name]  Date: 10/02/2020  [facility] [place], [place]  [place]  [place] [address]  [phone]  MRN: [id number]  DOB: 05-14-1966    Chief complaint: The patient is a 54 y.o. old female who came in today for Diabetes.    HPI:    Diabetes: Pt last A1C was 9.9%.  Unfortunately she has 1 day early for A1c testing.  She agrees to come back tomorrow for labs and diabetic management.  Patient denies symptoms of polydipsia, polyuria, hypoglycemic unawareness, chest pain, dyspnea on exertion, diarrhea, foot ulcerations, paresthesias. Symptoms are not changed. Patient's home blood sugars are now with improved controlled.  This morning's fasting sugar was 172. Patient is compliant with medications.  She is currently taking metformin 500 mg, an SGLT2 inhibitor and a DPP-4 inhibitor.  She denies side effects.    ROS:  Review of systems completed with all positives and pertinent negatives as per HPI.  She complains of vague abdominal pain that is been worsening over the last 3 weeks,  And a facial lesion right-sided.  Otherwise, negative.      Past medical history:  Past Medical History:   Diagnosis Date    Anxiety     Carotid stenosis     Chronic back pain     Coronary artery disease involving native coronary artery of native heart     DDD (degenerative disc disease), lumbar     "entire spine"    Ear piercing     GERD (gastroesophageal reflux disease)     H/O complete eye exam     2 years, Dr. [name], at [facility]    History of dental examination     dentures upper and lower    HTN     Hypercholesterolemia     Hyperlipidemia     MI (myocardial infarction) (CMS [facility]) 2012    Neck problem     herniated cervical disc    Obesity (BMI 30-39.9)     Solitary nodule of right lobe of thyroid 03/30/2018    Incidental finding on MRI thoracic spine. [place] ordered.     Stroke (CMS [facility])     2002    Tattoo     Left breast, Right shoulder    Thyroid disorder      Thyroid nodule     Uncontrolled type 2 diabetes mellitus, without long-term current use of insulin (CMS HCC)     Xanthelasma      Past surgical history:  Past Surgical History:   Procedure Laterality Date    ENDOMETRIAL ABLATION  1998    HX ANKLE FRACTURE [place]    Right, Doran Durand procedure    HX CATARACT REMOVAL Bilateral 2013    r and l eyes with implants    HX CESAREAN SECTION  06/20/2000    x3, 11/21/86, 11/10/84    HX CHOLECYSTECTOMY  1988    HX CORONARY ARTERY BYPASS GRAFT  03/31/2011    cardiac, 2 vessel, Dr. [name], [place]  2012    x4 stents    HX GASTRIC SLEEVE  02/2017    [place], [place], Dr. [name] HEART CATHETERIZATION  2012    massive heart attack and stents - [facility]    HX THYROID BIOPSY      HX THYROIDECTOMY  101/01/2018    HX TONSILLECTOMY      as child  HX YAG Left 09/20/2013     Medications:  Current Outpatient Medications   Medication Sig    ACCU-CHEK AVIVA PLUS TEST STRP Strip USE 1 STRIP TO TEST BLOOD SUGAR THREE TIMES A DAY    Blood-Glucose Meter (ACCU-CHEK AVIVA PLUS METER) Misc 1 Kit by Does not apply route Three times a day    clopidogreL (PLAVIX) 75 mg Oral Tablet TAKE 1 TABLET BY MOUTH ONCE DAILY    cyanocobalamin (VITAMIN B12) 1,000 mcg/mL Injection Solution 1 mL (1,000 mcg total) by Subcutaneous route Every 30 days    diclofenac sodium (VOLTAREN) 1 % Gel 2 g by Apply Topically route Three times a day as needed    docusate sodium (COLACE) 100 mg Oral Capsule Take 1 Cap (100 mg total) by mouth Twice daily    empagliflozin-linagliptin (GLYXAMBI) 10-5 mg Oral Tablet TAKE 1 TABLET BY MOUTH ONCE DAILY    ergocalciferol, vitamin D2, (DRISDOL) 1,250 mcg (50,000 unit) Oral Capsule TAKE 1 CAPSULE BY MOUTH EVERY WEEK    furosemide (LASIX) 40 mg Oral Tablet TAKE 1 TABLET(40 MG) BY MOUTH EVERY DAY    Ibuprofen (MOTRIN) 800 mg Oral Tablet Take 1 Tab (800 mg total) by mouth Three times a day as needed for Pain    levothyroxine  (SYNTHROID) 25 mcg Oral Tablet Take 1 Tab (25 mcg total) by mouth Once a day    lisinopriL (PRINIVIL) 2.5 mg Oral Tablet TAKE 1 TABLET BY MOUTH EVERY DAY    metFORMIN (GLUCOPHAGE) 500 mg Oral Tablet TAKE 1 TABLET BY MOUTH THREE TIMES DAILY WITH MEALS    metoprolol succinate (TOPROL-XL) 25 mg Oral Tablet Sustained Release 24 hr Take 0.5 Tabs (12.5 mg total) by mouth Once a day    NARCAN 4 mg/actuation Nasal Spray, Non-Aerosol PT STATED SHE HAS NARCAN AT HOME BUT IS NOT TAKING IT    niacin (NIASPAN) 500 mg Oral Tablet Sustained Release Take 1 Tab (500 mg total) by mouth Once a day    nitroGLYCERIN (NITROSTAT) 0.4 mg Sublingual Tablet, Sublingual 1 Tab (0.4 mg total) by Sublingual route Every 5 minutes as needed for Chest pain for 3 doses over 15 minutes    omeprazole (PRILOSEC) 40 mg Oral Capsule, Delayed Release(E.C.) TAKE 1 CAPSULE BY MOUTH ONCE DAILY    ondansetron (ZOFRAN ODT) 8 mg Oral Tablet, Rapid Dissolve DISSOLVE 1 TABLET(8 MG) ON THE TONGUE EVERY 8 HOURS AS NEEDED FOR NAUSEA OR VOMITING    PRENATAL PLUS, CALCIUM CARB, 27 mg iron- 1 mg Oral Tablet Take 1 Tab by mouth Once a day    PROVENTIL HFA 90 mcg/actuation Inhalation HFA Aerosol Inhaler Take 1-2 Puffs by inhalation Every 4 hours as needed    rosuvastatin (CRESTOR) 20 mg Oral Tablet TAKE 1 TABLET BY MOUTH ONCE DAILY    rosuvastatin (CRESTOR) 40 mg Oral Tablet TAKE 1 TABLET(40 MG) BY MOUTH EVERY DAY    venlafaxine (EFFEXOR XR) 37.5 mg Oral Capsule, Sust. Release 24 hr TAKE 1 CAPSULE BY MOUTH ONCE DAILY    VENTOLIN HFA 90 mcg/actuation Inhalation HFA Aerosol Inhaler Take 1-2 Puffs by inhalation Every 6 hours as needed    ZYRTEC-D 5-120 mg Oral Tablet Sustained Release 12 hr Take 1 Tab by mouth Once a day     Vitals:    10/02/20 1305   BP: 128/68   Pulse: 65   Resp: 16   SpO2: 97%   Weight: 94.3 kg (208 lb)   Height: 1.676 m (_0 )   BMI: 33.64  Three year weight curve.      Physical Examination:    GENERAL:   Pt is a pleasant,  well-nourished, well-developed 54 y.o. female who is in NAD. Appears stated age  28:  head normocephalic, symmetrical facies. EOM intact b/l. PERRLA. Sclera non-icteric, non-injected. upper eyelid w/Xythoma-lipid plaques, diminished in fullness and intensity-in fact nearly resolved..  No rhinorrhea. Oropharyngeal mucous membranes are moist  w/o erythema/exudates   NECK:   No masses, no lymphadenopathy, no JVD on exam. Trachea midline, no thyromegaly   CV: S1, S2. No murmurs, rubs, gallops.     LUNGS: CTAB, No rhonchi, rales, wheezes.   GI: (+) BS in all 4 quadrants. soft, NT/ND. No rigidity/positive for guarding/negative for rebound. No organomegaly/masses, or abdominal bruits, complains of epigastric and right upper quadrant pain.  Cannot be provoked on exam.  MSK: Spontaneous normal AROM of major joints as observed during exam. No joint effusions/ swelling/ deformities.   No BLE edema, clubbing, or cyanosis.   2+ radial pulse present b/l.   + 2 reflexes Brachioradialis and patellar bilaterally.   Gait normal.  SKIN: No significant lesions, rashes, ecchymoses noted.  Just above the supraorbital ridge is a 0.75 cm round raised pink lesion.  Is hypervascular its margins.  There is some central depression.  This has the appearance of a basal cell, however, patient notes that this here more than 10 years and it is only very slowly changing.  NEURO: AAOx4, CN grossly intact. Sensation intact b/l. No focal deficits.  PSYCH: Mood, behavior and affect normal w/ Intact judgement & insight  Exam unchanged.    There are no exam notes on file for this visit.    Diabetes Monitors    CBC  Diff   Lab Results   Component Value Date/Time    WBC 10.8 01/30/2020 12:13 PM    HGB 14.2 01/30/2020 12:13 PM    HCT 43.9 01/30/2020 12:13 PM    PLTCNT 382 01/30/2020 12:13 PM    RBC 4.84 01/30/2020 12:13 PM    MCV 90.7 01/30/2020 12:13 PM    MCHC 32.3 01/30/2020 12:13 PM    MCH 29.3 01/30/2020 12:13 PM    RDW 13.3 08/30/2018 09:07 AM     MPV 10.2 01/30/2020 12:13 PM    Lab Results   Component Value Date/Time    PMNS 53 08/30/2018 09:07 AM    LYMPHOCYTES 33 08/30/2018 09:07 AM    EOSINOPHIL 3 08/30/2018 09:07 AM    MONOCYTES 11 (H) 08/30/2018 09:07 AM    BASOPHILS 1 08/30/2018 09:07 AM    BASOPHILS 0.10 08/30/2018 09:07 AM    PMNABS 4.60 08/30/2018 09:07 AM    LYMPHSABS 2.90 08/30/2018 09:07 AM    EOSABS 0.20 08/30/2018 09:07 AM    MONOSABS 0.90 (H) 08/30/2018 09:07 AM    BASOSABS 0.034 04/01/2011 05:08 AM          COMPREHENSIVE METABOLIC PANEL - NON FASTING  Lab Results   Component Value Date    SODIUM 140 07/12/2020    POTASSIUM 4.2 07/12/2020    CHLORIDE 106 07/12/2020    CO2 22 07/12/2020    ANIONGAP 12 07/12/2020    BUN 9 07/12/2020    CREATININE 1.03 07/12/2020    GLUCOSENF 219 (H) 07/12/2020    CALCIUM 8.4 (L) 07/12/2020    ALBUMIN 3.4 (L) 01/30/2020    TOTALPROTEIN 6.2 (L) 01/30/2020    ALKPHOS 102 01/30/2020    AST 21 01/30/2020    ALT 15 01/30/2020  A1C: 9.9  A1C Date: 07/04/2020         9.6 today.  Nephropathy Screening: On ACEI or ARB  Lab Results   Component Value Date    CHOLESTEROL 131 07/12/2020    HDLCHOL 43 (L) 07/12/2020    LDLCHOL 61 07/12/2020    TRIG 136 07/12/2020      Retinal Exam Date: 02/01/2019  Last diabetic foot exam: 06/01/2019     Visit Diagnosis    Encounter Diagnoses   Name Primary?    Need for vaccination Yes     Orders Placed This Encounter    Flu Vaccine, 6 month-adult,0.5 mL IM (Admin)       Type 2 diabetes mellitus with complication, without long-term current use of insulin (CMS HCC)  Chronic,improved control  HbA1C unknown.  Patient will present tomorrow for new A1c.  To last time continue current medications.  She will continue work on diet.  Continue to address weight loss which is stalled.  Continue metformin 500 mg to t.i.d..  Stopped Trulicity for cost and difficulty with compliance.  Continue Glyxambi 10-5 mg.    Encouraged her to check blood sugars 3-4 times daily.  Discuss renal concerns and need  for monitoring.   Labs ordered as above.    Coronary artery disease  Status post CABG  Continue Plavix  Continue beta-blocker  Continue statin.  Continue sublingual nitro p.r.n. for chest pain.  Continue with Cardiology.    Depression, unspecified depression type  Chronic, controlled.  Continue Effexor    Weight loss:  Has lost significant weight since our last encounter.  Continue behavioral efforts.    GERD  Chronic, Uncontrolled problem   Encouraged lifestyle modification to help with acid reflux, including:   Weight loss   Avoid eating for 2-3 hours prior to bedtime. Avoid late night snacking.    Avoid hot/spicy and acidic foods and overeating.    Remain upright after eating, avoid tight clothing.    Elevate head of bed.   Chew gum or lozenges.   Refrain from alcohol and smoking.   Continue histamine blocker.  Continue  proton pump inhibitor.  Has appointment General surgery for upper GI scope.  She will attend to the emergency room if her symptoms warrant prior to that encounter.      Chronic left-sided lower extremity pain  We review her imaging to her satisfaction.  Copies of the results are offered to the patient.  Patient with osteoarthritis  Has comorbid peripheral vascular disease  Will increase her statin follow.  Patient has follow-ups with orthopedics for the hip and knee.  Continue follow-up with Dr. Burman Riis in [facility]. for low back pain.    We again discussed use of weight loss as a primary method for pain relief.  Continue on muscle relaxer.  Patient avoids nonsteroidals secondary to her bariatric surgery.  Consider new leg pain the result of electrolyte deficiency will check BMP.     Carotid Art Stenosis:   50% stenosis to the right ICA  100% stenosis to left ICA.  Continue with Cardiology, Interventional Cardiology  Has had follow-ups with vascular surgery scheduled but has not completed those encounters.    Will follow.    Right upper extremity 5th digit fracture.  Subacute.  Now 1  month duration.  Expect complete we ossification within 12 weeks.  She was seen by the PA at the orthopedic office who recommended no intervention.  That note and plan are reviewed with the patient today.  She is content with the current progress.    Right facial nevus  Would benefit from biopsy.  Referral as above.  Will follow.    Immunization requested:  At the patient's request we drafted a letter to allow her to obtain immunization for COVID as soon as possible.    Return in about 13 weeks (around 01/01/2021), or if symptoms worsen or fail to improve, for In Person Visit, chronic disease management including diabetes.Cherly Beach, MD  10/02/2020, 13:20    This note was partially generated using MModal Fluency Direct system, and there may be some incorrect words, spellings, and punctuation that were not noted in checking the note before saving.

## 2020-10-10 ENCOUNTER — Other Ambulatory Visit (HOSPITAL_BASED_OUTPATIENT_CLINIC_OR_DEPARTMENT_OTHER): Payer: Self-pay | Admitting: Family Medicine

## 2020-10-10 DIAGNOSIS — K219 Gastro-esophageal reflux disease without esophagitis: Secondary | ICD-10-CM

## 2020-10-10 MED ORDER — OMEPRAZOLE 40 MG CAPSULE,DELAYED RELEASE
DELAYED_RELEASE_CAPSULE | ORAL | 3 refills | Status: DC
Start: 2020-10-10 — End: 2021-06-06

## 2020-10-18 ENCOUNTER — Telehealth (INDEPENDENT_AMBULATORY_CARE_PROVIDER_SITE_OTHER): Payer: Self-pay | Admitting: Surgery

## 2020-10-18 ENCOUNTER — Ambulatory Visit (HOSPITAL_BASED_OUTPATIENT_CLINIC_OR_DEPARTMENT_OTHER): Payer: Self-pay | Admitting: Family Medicine

## 2020-10-18 NOTE — Telephone Encounter (Signed)
Prescreen call completed for [facility]. Patient denies exposure to COVID-19, fever, new or worsening cough, new or worsening shortness of breath, or a new loss of smell or taste. Notified of the visitor policy, mask requirement, and to arrive no more than 10 minutes prior to appointment. Understanding was verbalized.    Monique [name], CMA

## 2020-10-18 NOTE — Telephone Encounter (Signed)
Regarding: Dr. Russ Halo  ----- Message from Myna Hidalgo sent at 10/17/2020 10:20 AM EST -----  Patient is wanting to know if the prior authorization was sent to insurance for her colonoscopy.

## 2020-10-19 ENCOUNTER — Ambulatory Visit (INDEPENDENT_AMBULATORY_CARE_PROVIDER_SITE_OTHER): Payer: Commercial Managed Care - PPO | Admitting: Family

## 2020-10-19 ENCOUNTER — Ambulatory Visit (INDEPENDENT_AMBULATORY_CARE_PROVIDER_SITE_OTHER): Payer: Self-pay | Admitting: PSYCHIATRY AND NEUROLOGY-NEUROLOGY

## 2020-10-19 ENCOUNTER — Encounter (INDEPENDENT_AMBULATORY_CARE_PROVIDER_SITE_OTHER): Payer: Self-pay | Admitting: Surgery

## 2020-10-19 ENCOUNTER — Other Ambulatory Visit: Payer: Self-pay

## 2020-10-19 VITALS — BP 106/64 | Ht 66.0 in | Wt 218.0 lb

## 2020-10-19 DIAGNOSIS — I6523 Occlusion and stenosis of bilateral carotid arteries: Secondary | ICD-10-CM

## 2020-10-19 DIAGNOSIS — G4733 Obstructive sleep apnea (adult) (pediatric): Secondary | ICD-10-CM

## 2020-10-19 DIAGNOSIS — I6521 Occlusion and stenosis of right carotid artery: Secondary | ICD-10-CM

## 2020-10-19 NOTE — Progress Notes (Signed)
[place]  [place] [address]    Name: Monique [name] MRN:  [id number]   Date: 10/19/2020 Age: 54 y.o.     Referring Provider: Marcelino Scot    Chief Complaint: Carotid Stenosis (RT CAROTID STENOSIS, LT OCCLUDED 2012)    History of Present Illness   I had the pleasure of seeing Monique [name] as a new patient with referral for carotid stenosis. Patient has known chronic left ICA occlusion, known since 2012. She reports several TIAs in 2009. She denies any current focal neurosensory motor deficits, dysarthria or amaurosis. She has been told conflicting information regarding whether surgical intervention was possible for left ICA occlusion. Last carotid duplex was in December of 2020 and confirms left ICA occlusion with <50% right ICA stenosis. She is on good medical therapy, but unfortunately does smoke about 1/2 PPD.     Past Surgical History:   Procedure Laterality Date   . ENDOMETRIAL ABLATION  1998   . HX ANKLE FRACTURE [place]  1990s    Right, Doran Durand procedure   . HX CATARACT REMOVAL Bilateral 2013    r and l eyes with implants   . HX CESAREAN SECTION  06/20/2000    x3, 11/21/86, 11/10/84   . HX CHOLECYSTECTOMY  1988   . HX CORONARY ARTERY BYPASS GRAFT  03/31/2011    cardiac, 2 vessel, Dr. [name], [facility]   . HX CORONARY STENT PLACEMENT  2012    x4 stents   . HX GASTRIC SLEEVE  02/2017    [place], [place], Dr. Pearlie Oyster   . HX HEART CATHETERIZATION  2012    [facility] st[facility] - [facility]   . HX THYROID BIOPSY     . HX THYROIDECTOMY  101/01/2018   . HX TONSILLECTOMY      as child   . HX YAG Left 09/20/2013       Allergies   Allergen Reactions   . Ane Payment [Pyrilamine-Dextromethorphan]  Other Adverse Reaction (Add comment)     LIGHT HEADED   . Tylenol W Codeine [Acetaminophen-Codeine]  Other Adverse Reaction (Add comment)     pt states that one time she had chest pains with this med, but dr. thought it was  because she had not eaten     Patient Active Problem List    Diagnosis   . Fracture of phalanx of finger of right hand   . Spinal pain   . Type 2 diabetes mellitus with hyperglycemia (CMS HCC)   . Upper respiratory infection   . Thyroid disorder   . UDS: 07/28/19   . Sinus congestion   . Morbid obesity (CMS [facility])   . Type 2 diabetes mellitus, without long-term current use of insulin (CMS HCC)   . Coronary artery disease involving native coronary artery of native heart   . GERD (gastroesophageal reflux disease)   . HTN (hypertension)   . Hyperlipidemia   . Stroke (CMS [facility])   . Constipation   . Thyroid nodule   . Chest pain   . DDD (degenerative disc disease), lumbar   . Anxiety   . Chronic back pain   . Carotid stenosis   . BMI 35.0-35.9,adult   . H/O complete eye exam   . S/P CABG x 2   . Xanthelasma   . MI (myocardial infarction) (CMS [facility])   . Hypercholesterolemia     Past Medical History:   Diagnosis Date   .  Anxiety    . Carotid stenosis    . Chronic back pain    . Coronary artery disease involving native coronary artery of native heart    . DDD (degenerative disc disease), lumbar     "entire spine"   . Ear piercing    . GERD (gastroesophageal reflux disease)    . H/O complete eye exam     2 years, Dr. [name], at [facility]   . History of dental examination     dentures upper and lower   . HTN    . Hypercholesterolemia    . Hyperlipidemia    . MI (myocardial infarction) (CMS [place]) 2012   . Neck problem     herniated cervical disc   . Obesity (BMI 30-39.9)    . 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 [place] ordered.    . Stroke (CMS [facility])     2002   . Tattoo     Left breast, Right shoulder   . Thyroid disorder    . Thyroid nodule    . Uncontrolled type 2 diabetes mellitus, without long-term current use of insulin (CMS [place])    . Xanthelasma        Social History     Tobacco Use   . Smoking status: Current Every Day Smoker     Packs/day: 0.50     Years: 20.00     Pack years:  10.00     Types: Cigarettes     Start date: 46   . Smokeless tobacco: Never Used   Substance Use Topics   . Alcohol use: No      Current Outpatient Medications   Medication Sig   . ACCU-CHEK AVIVA PLUS TEST STRP Strip USE 1 STRIP TO TEST BLOOD SUGAR THREE TIMES A DAY   . Blood-Glucose Meter (ACCU-CHEK AVIVA PLUS METER) Misc 1 Kit by Does not apply route Three times a day   . clopidogreL (PLAVIX) 75 mg Oral Tablet TAKE 1 TABLET BY MOUTH ONCE DAILY   . cyanocobalamin (VITAMIN B12) 1,000 mcg/mL Injection Solution 1 mL (1,000 mcg total) by Subcutaneous route Every 30 days   . diclofenac sodium (VOLTAREN) 1 % Gel 2 g by Apply Topically route Three times a day as needed   . docusate sodium (COLACE) 100 mg Oral Capsule Take 1 Cap (100 mg total) by mouth Twice daily   . empagliflozin-linagliptin (GLYXAMBI) 10-5 mg Oral Tablet TAKE 1 TABLET BY MOUTH ONCE DAILY   . ergocalciferol, vitamin D2, (DRISDOL) 1,250 mcg (50,000 unit) Oral Capsule TAKE 1 CAPSULE BY MOUTH EVERY WEEK   . furosemide (LASIX) 40 mg Oral Tablet TAKE 1 TABLET(40 MG) BY MOUTH EVERY DAY   . Ibuprofen (MOTRIN) 800 mg Oral Tablet Take 1 Tablet (800 mg total) by mouth Three times a day as needed for Pain   . levothyroxine (SYNTHROID) 25 mcg Oral Tablet Take 1 Tab (25 mcg total) by mouth Once a day   . lisinopriL (PRINIVIL) 2.5 mg Oral Tablet TAKE 1 TABLET BY MOUTH EVERY DAY   . metFORMIN (GLUCOPHAGE) 500 mg Oral Tablet TAKE 1 TABLET BY MOUTH THREE TIMES DAILY WITH MEALS   . metoprolol succinate (TOPROL-XL) 25 mg Oral Tablet Sustained Release 24 hr Take 0.5 Tabs (12.5 mg total) by mouth Once a day   . NARCAN 4 mg/actuation Nasal Spray, Non-Aerosol PT STATED SHE HAS NARCAN AT HOME BUT IS NOT TAKING IT   . niacin (  NIASPAN) 500 mg Oral Tablet Sustained Release Take 1 Tab (500 mg total) by mouth Once a day   . nitroGLYCERIN (NITROSTAT) 0.4 mg Sublingual Tablet, Sublingual 1 Tab (0.4 mg total) by Sublingual route Every 5 minutes as needed for Chest pain for 3 doses  over 15 minutes   . omeprazole (PRILOSEC) 40 mg Oral Capsule, Delayed Release(E.C.) TAKE 1 CAPSULE BY MOUTH ONCE DAILY   . ondansetron (ZOFRAN ODT) 8 mg Oral Tablet, Rapid Dissolve DISSOLVE 1 TABLET(8 MG) ON THE TONGUE EVERY 8 HOURS AS NEEDED FOR NAUSEA OR VOMITING   . PRENATAL PLUS, CALCIUM CARB, 27 mg iron- 1 mg Oral Tablet Take 1 Tab by mouth Once a day   . PROVENTIL HFA 90 mcg/actuation Inhalation HFA Aerosol Inhaler Take 1-2 Puffs by inhalation Every 4 hours as needed   . rosuvastatin (CRESTOR) 20 mg Oral Tablet TAKE 1 TABLET BY MOUTH ONCE DAILY   . rosuvastatin (CRESTOR) 40 mg Oral Tablet TAKE 1 TABLET(40 MG) BY MOUTH EVERY DAY (Patient not taking: TAKE 1 TABLET(40 MG) BY MOUTH EVERY DAY)   . venlafaxine (EFFEXOR XR) 37.5 mg Oral Capsule, Sust. Release 24 hr TAKE 1 CAPSULE BY MOUTH ONCE DAILY   . VENTOLIN HFA 90 mcg/actuation Inhalation HFA Aerosol Inhaler Take 1-2 Puffs by inhalation Every 6 hours as needed   . ZYRTEC-D 5-120 mg Oral Tablet Sustained Release 12 hr Take 1 Tab by mouth Once a day     Family Medical History:     Problem Relation (Age of Onset)    Atrial fibrillation Sister    Colon Cancer Father    Diabetes Mother, Maternal Aunt    Heart Disease Sister, Maternal Grandfather    Hypertension (High Blood Pressure) Mother    Lung Cancer Maternal Grandfather    MI <51 years of age Mother        Review of Systems  Please see HPI.  Otherwise negative x 10.    Physical Examination:  BP 106/64 (Site: Manual, Patient Position: Sitting, Cuff Size: Adult Large) Comment: LEFT ARM Comment (Site): RIGHT ARM 90/56  Ht 1.676 m (5' 6")   Wt 98.9 kg (218 lb)   LMP  (LMP Unknown)   BMI 35.19 kg/m   GENERAL:  The patient is alert and oriented, does not appear to be in any acute distress.  HEENT:  Pupils are equal, round and reactive.  Extraocular movements are intact.  NEUROLOGICALLY:  Cranial nerves II-XII are grossly intact.  No motor sensory deficits.  HEART:  Regular rate and rhythm.  No murmurs  present.  LUNGS:  Clear to auscultation bilaterally.  ABDOMEN:  Soft, nontender and nondistended.   NECK:  No bruits noted over the carotids.  EXTREMITIES:  Warm and well perfused.      Problem List Items Addressed This Visit        Neurologic    Carotid stenosis - Primary    Relevant Orders    CAROTID ARTERY DUPLEX          Assessment and Plan:  Will order repeat duplex as it has been 1 year. Would likely f/u biannually given left occlusion. Patient is on good medical therapy with Plavix and Statin. She understands to call with any questions or concerns and to report to the emergency department should any signs or symptoms of stroke/TIA present. Otherwise, I will call her after duplex with results and recommendations.     Beth Ankrom, APRN,FNP-BC    I personally performed the services described in  this documentation, as scribed  in my presence, and it is both accurate  and complete.    Beth Ankrom, APRN,FNP-BC    On the day of the encounter, a total of  45 minutes was spent on this patient encounter including review of historical information, examination, documentation and post-visit activities.

## 2020-10-19 NOTE — Telephone Encounter (Signed)
Dr. Francella Solian states: Yes, okay for HST. The reason why other asking is because opiates can cause or worsen obstructive/central sleep apnea      Order placed and authorization process began    Fabio Neighbors, LPN

## 2020-10-19 NOTE — Telephone Encounter (Signed)
Monique [name] called in asking if you'd be agreeable to her having an HST instead of continuing to wait for the lab to reopen.     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 (progress notes 07/27/20)    Fabio Neighbors, LPN

## 2020-10-22 ENCOUNTER — Other Ambulatory Visit: Payer: Self-pay

## 2020-10-22 ENCOUNTER — Ambulatory Visit: Payer: Commercial Managed Care - PPO | Attending: Family

## 2020-10-22 DIAGNOSIS — I6521 Occlusion and stenosis of right carotid artery: Secondary | ICD-10-CM | POA: Insufficient documentation

## 2020-11-08 ENCOUNTER — Other Ambulatory Visit (INDEPENDENT_AMBULATORY_CARE_PROVIDER_SITE_OTHER): Payer: Self-pay | Admitting: Family

## 2020-11-08 DIAGNOSIS — I6521 Occlusion and stenosis of right carotid artery: Secondary | ICD-10-CM

## 2020-11-12 ENCOUNTER — Ambulatory Visit
Admission: RE | Admit: 2020-11-12 | Discharge: 2020-11-12 | Disposition: A | Discharge status: Home / Self Care | Payer: Commercial Managed Care - PPO | Source: Ambulatory Visit | Attending: PSYCHIATRY AND NEUROLOGY-NEUROLOGY | Admitting: PSYCHIATRY AND NEUROLOGY-NEUROLOGY

## 2020-11-12 DIAGNOSIS — G4733 Obstructive sleep apnea (adult) (pediatric): Secondary | ICD-10-CM | POA: Insufficient documentation

## 2020-11-12 NOTE — Ancillary Notes (Signed)
Education about procedure performed in the Sleep Lab has been provided prior to starting testing.  Patient verbalized understanding and testing performed as ordered.

## 2020-11-17 NOTE — Procedures (Signed)
NAME:  Milosevic, Blackwell NUMBER:  [id number]  DATE OF SERVICE:  11/12/2020  DOB:  02-18-1966  SEX:  F      A portable home sleep apnea test was performed on November 12, 2020, start time 2300, end time 0916, total recording duration 10 hours 16 minutes and the total monitoring time was 3 hours 21 minutes.  The device technology included a nasal pressure transducer with derived snoring sensor, pulse oximetry with derived heart rate, a position sensor to indicate body position, and a single RIP belt around the chest to show breathing effort.  Hypopneas were scored if there was at least 30% reduction in airflow and 4% desaturation.    DATA:  Only 3 sleep-disordered respiratory events, yield a low REI of 0.9.  However, there was no signal from either effort or flow channel for the majority of the study.  Baseline oxygen saturation 95% with desaturations to 81%.  Average derived pulse rate throughout the night 75 per minute.    CONCLUSIONS:  This is an inconclusive HST because of unit malfunction.  Recommend in laboratory diagnostic polysomnogram because of concern about repeated technical problems.        Etta Quill Elliot Gurney., MD            DD:  11/17/2020 14:50:31  DT:  11/17/2020 16:17:38 LB  D#:  [id number]

## 2020-12-04 ENCOUNTER — Encounter (INDEPENDENT_AMBULATORY_CARE_PROVIDER_SITE_OTHER): Payer: Self-pay | Admitting: PSYCHIATRY AND NEUROLOGY-NEUROLOGY

## 2020-12-22 ENCOUNTER — Other Ambulatory Visit: Payer: Self-pay

## 2020-12-22 ENCOUNTER — Ambulatory Visit (INDEPENDENT_AMBULATORY_CARE_PROVIDER_SITE_OTHER): Payer: Commercial Managed Care - PPO | Admitting: NURSE PRACTITIONER

## 2020-12-22 ENCOUNTER — Encounter (INDEPENDENT_AMBULATORY_CARE_PROVIDER_SITE_OTHER): Payer: Self-pay

## 2020-12-22 VITALS — BP 112/62 | HR 74 | Temp 97.1°F | Resp 16 | Ht 65.0 in | Wt <= 1120 oz

## 2020-12-22 DIAGNOSIS — J329 Chronic sinusitis, unspecified: Secondary | ICD-10-CM

## 2020-12-22 MED ORDER — CEFDINIR 300 MG CAPSULE
300.0000 mg | ORAL_CAPSULE | Freq: Two times a day (BID) | ORAL | 0 refills | Status: DC
Start: 2020-12-22 — End: 2021-01-03

## 2020-12-22 NOTE — Progress Notes (Signed)
[address] [facility]  [address]  [place] [id number]    Progress Note    Name: Monique [name] MRN:  [id number]   Date: 12/22/2020 Age: 55 y.o.         Reason for Visit: Sinus Drainage (headache x 3 days)    History of Present Illness  Monique [name] is a 55 y.o. female who is being seen today for The above stated symptoms, denies alleviating factors.    Nursing Notes:  There are no exam notes on file for this visit.    Past Medical History:   Diagnosis Date   . Anxiety    . Carotid stenosis    . Chronic back pain    . Coronary artery disease involving native coronary artery of native heart    . DDD (degenerative disc disease), lumbar     "entire spine"   . Ear piercing    . GERD (gastroesophageal reflux disease)    . H/O complete eye exam     2 years, Dr. [name], at [facility]   . History of dental examination     dentures upper and lower   . HTN    . Hypercholesterolemia    . Hyperlipidemia    . MI (myocardial infarction) (CMS [place]) 2012   . Neck problem     herniated cervical disc   . Obesity (BMI 30-39.9)    . 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 [place] ordered.    . Stroke (CMS [facility])     2002   . Tattoo     Left breast, Right shoulder   . Thyroid disorder    . Thyroid nodule    . Uncontrolled type 2 diabetes mellitus, without long-term current use of insulin (CMS [place])    . Xanthelasma          Past Surgical History:   Procedure Laterality Date   . ENDOMETRIAL ABLATION  1998   . HX ANKLE FRACTURE [place]  1990s    Right, Doran Durand procedure   . HX CATARACT REMOVAL Bilateral 2013    r and l eyes with implants   . HX CESAREAN SECTION  06/20/2000    x3, 11/21/86, 11/10/84   . HX CHOLECYSTECTOMY  1988   . HX CORONARY ARTERY BYPASS GRAFT  03/31/2011    cardiac, 2 vessel, Dr. [name], [facility]   . HX CORONARY STENT PLACEMENT  2012    x4 stents   . HX GASTRIC SLEEVE  02/2017    [place], [place], Dr. Pearlie Oyster   . HX HEART CATHETERIZATION   2012    massive heart attack and stents - [facility]   . HX THYROID BIOPSY     . HX THYROIDECTOMY  101/01/2018   . HX TONSILLECTOMY      as child   . HX YAG Left 09/20/2013         Current Outpatient Medications   Medication Sig   . ACCU-CHEK AVIVA PLUS TEST STRP Strip USE 1 STRIP TO TEST BLOOD SUGAR THREE TIMES A DAY   . Blood-Glucose Meter (ACCU-CHEK AVIVA PLUS METER) Misc 1 Kit by Does not apply route Three times a day   . cefdinir (OMNICEF) 300 mg Oral Capsule Take 1 Capsule (300 mg total) by mouth Twice daily for 10 days   . clopidogreL (PLAVIX) 75 mg Oral Tablet TAKE 1 TABLET BY MOUTH ONCE DAILY   . cyanocobalamin (  VITAMIN B12) 1,000 mcg/mL Injection Solution 1 mL (1,000 mcg total) by Subcutaneous route Every 30 days   . diclofenac sodium (VOLTAREN) 1 % Gel 2 g by Apply Topically route Three times a day as needed   . docusate sodium (COLACE) 100 mg Oral Capsule Take 1 Cap (100 mg total) by mouth Twice daily   . empagliflozin-linagliptin (GLYXAMBI) 10-5 mg Oral Tablet TAKE 1 TABLET BY MOUTH ONCE DAILY   . ergocalciferol, vitamin D2, (DRISDOL) 1,250 mcg (50,000 unit) Oral Capsule TAKE 1 CAPSULE BY MOUTH EVERY WEEK   . furosemide (LASIX) 40 mg Oral Tablet TAKE 1 TABLET(40 MG) BY MOUTH EVERY DAY   . Ibuprofen (MOTRIN) 800 mg Oral Tablet Take 1 Tablet (800 mg total) by mouth Three times a day as needed for Pain   . levothyroxine (SYNTHROID) 25 mcg Oral Tablet Take 1 Tab (25 mcg total) by mouth Once a day   . lisinopriL (PRINIVIL) 2.5 mg Oral Tablet TAKE 1 TABLET BY MOUTH EVERY DAY   . metFORMIN (GLUCOPHAGE) 500 mg Oral Tablet TAKE 1 TABLET BY MOUTH THREE TIMES DAILY WITH MEALS   . metoprolol succinate (TOPROL-XL) 25 mg Oral Tablet Sustained Release 24 hr Take 0.5 Tabs (12.5 mg total) by mouth Once a day   . NARCAN 4 mg/actuation Nasal Spray, Non-Aerosol PT STATED SHE HAS NARCAN AT HOME BUT IS NOT TAKING IT   . niacin (NIASPAN) 500 mg Oral Tablet Sustained Release Take 1 Tab (500 mg total) by mouth Once a day   .  nitroGLYCERIN (NITROSTAT) 0.4 mg Sublingual Tablet, Sublingual 1 Tab (0.4 mg total) by Sublingual route Every 5 minutes as needed for Chest pain for 3 doses over 15 minutes   . omeprazole (PRILOSEC) 40 mg Oral Capsule, Delayed Release(E.C.) TAKE 1 CAPSULE BY MOUTH ONCE DAILY   . ondansetron (ZOFRAN ODT) 8 mg Oral Tablet, Rapid Dissolve DISSOLVE 1 TABLET(8 MG) ON THE TONGUE EVERY 8 HOURS AS NEEDED FOR NAUSEA OR VOMITING   . PRENATAL PLUS, CALCIUM CARB, 27 mg iron- 1 mg Oral Tablet Take 1 Tab by mouth Once a day   . PROVENTIL HFA 90 mcg/actuation Inhalation HFA Aerosol Inhaler Take 1-2 Puffs by inhalation Every 4 hours as needed   . rosuvastatin (CRESTOR) 20 mg Oral Tablet TAKE 1 TABLET BY MOUTH ONCE DAILY   . rosuvastatin (CRESTOR) 40 mg Oral Tablet TAKE 1 TABLET(40 MG) BY MOUTH EVERY DAY (Patient not taking: TAKE 1 TABLET(40 MG) BY MOUTH EVERY DAY)   . venlafaxine (EFFEXOR XR) 37.5 mg Oral Capsule, Sust. Release 24 hr TAKE 1 CAPSULE BY MOUTH ONCE DAILY   . VENTOLIN HFA 90 mcg/actuation Inhalation HFA Aerosol Inhaler Take 1-2 Puffs by inhalation Every 6 hours as needed   . ZYRTEC-D 5-120 mg Oral Tablet Sustained Release 12 hr Take 1 Tab by mouth Once a day     Allergies   Allergen Reactions   . Ane Payment [Pyrilamine-Dextromethorphan]  Other Adverse Reaction (Add comment)     LIGHT HEADED   . Tylenol W Codeine [Acetaminophen-Codeine]  Other Adverse Reaction (Add comment)     pt states that one time she had chest pains with this med, but dr. thought it was because she had not eaten     Family Medical History:     Problem Relation (Age of Onset)    Atrial fibrillation Sister    Colon Cancer Father    Diabetes Mother, Maternal Aunt    Heart Disease Sister, Maternal Grandfather    Hypertension (  High Blood Pressure) Mother    Lung Cancer Maternal Grandfather    MI <69 years of age Mother          Social History     Tobacco Use   . Smoking status: Current Every Day Smoker     Packs/day: 0.50     Years: 20.00     Pack years:  10.00     Types: Cigarettes     Start date: 63   . Smokeless tobacco: Never Used   Vaping Use   . Vaping Use: Never used   Substance Use Topics   . Alcohol use: No   . Drug use: No       Review of Systems  Review of Systems   HENT: Positive for sinus pain.    Neurological: Positive for headaches.       Physical Exam:  BP 112/62   Pulse 74   Temp 36.2 C (97.1 F)   Resp 16   Ht 1.651 m (_0 )   Wt (!) 0.095 kg (3.4 oz)   LMP  (LMP Unknown)   SpO2 98%   BMI 0.03 kg/m       Physical Exam  Vitals and nursing note reviewed.   Constitutional:       General: She is not in acute distress.  HENT:      Head: Normocephalic and atraumatic.      Right Ear: Tympanic membrane, ear canal and external ear normal.      Left Ear: Tympanic membrane, ear canal and external ear normal.      Nose:      Right Sinus: Frontal sinus tenderness present.      Left Sinus: Frontal sinus tenderness present.      Mouth/Throat:      Mouth: Mucous membranes are moist.      Pharynx: Oropharynx is clear.   Eyes:      Conjunctiva/sclera: Conjunctivae normal.   Cardiovascular:      Rate and Rhythm: Normal rate and regular rhythm.      Heart sounds: Normal heart sounds.   Pulmonary:      Effort: Pulmonary effort is normal. No respiratory distress.      Breath sounds: Normal breath sounds.   Lymphadenopathy:      Cervical: No cervical adenopathy.   Skin:     General: Skin is warm and dry.   Neurological:      Mental Status: She is alert and oriented to person, place, and time.   Psychiatric:         Mood and Affect: Affect normal.         Judgment: Judgment normal.         Assessment:    Sinusitis, unspecified chronicity, unspecified location        Plan:  Pt would like to see if symptoms improve before covid testing.    Orders Placed This Encounter   . cefdinir (OMNICEF) 300 mg Oral Capsule       Follow up: No follow-ups on file.    Follow up with PCP.  Seek medical attention for new or worsening symptoms.    This note was partially created  using MModal Fluency Direct system (voice recognition software) and is inherently subject to errors including those of syntax and "sound-alike" substitutions which may escape proofreading.  In such instances, original meaning may be extrapolated by contextual derivation.     I saw the patient independently.  Kavin Leech, FNP  12/22/2020, 17:54

## 2020-12-25 ENCOUNTER — Other Ambulatory Visit (HOSPITAL_BASED_OUTPATIENT_CLINIC_OR_DEPARTMENT_OTHER): Payer: Self-pay | Admitting: Family Medicine

## 2020-12-25 DIAGNOSIS — R52 Pain, unspecified: Secondary | ICD-10-CM

## 2020-12-31 ENCOUNTER — Other Ambulatory Visit (HOSPITAL_BASED_OUTPATIENT_CLINIC_OR_DEPARTMENT_OTHER): Payer: Self-pay | Admitting: Family Medicine

## 2020-12-31 DIAGNOSIS — F32A Depression, unspecified: Secondary | ICD-10-CM

## 2020-12-31 DIAGNOSIS — R110 Nausea: Secondary | ICD-10-CM

## 2020-12-31 NOTE — Telephone Encounter (Signed)
L-10/02/20  N-01/03/21

## 2021-01-03 ENCOUNTER — Other Ambulatory Visit: Payer: Self-pay

## 2021-01-03 ENCOUNTER — Ambulatory Visit: Payer: Commercial Managed Care - PPO | Attending: Family Medicine | Admitting: Family Medicine

## 2021-01-03 ENCOUNTER — Encounter (HOSPITAL_BASED_OUTPATIENT_CLINIC_OR_DEPARTMENT_OTHER): Payer: Self-pay | Admitting: Family Medicine

## 2021-01-03 VITALS — BP 118/66 | HR 64 | Temp 96.8°F | Resp 16 | Ht 66.0 in | Wt 222.5 lb

## 2021-01-03 DIAGNOSIS — F339 Major depressive disorder, recurrent, unspecified: Secondary | ICD-10-CM

## 2021-01-03 DIAGNOSIS — R52 Pain, unspecified: Secondary | ICD-10-CM

## 2021-01-03 DIAGNOSIS — E118 Type 2 diabetes mellitus with unspecified complications: Secondary | ICD-10-CM

## 2021-01-03 DIAGNOSIS — Z6835 Body mass index (BMI) 35.0-35.9, adult: Secondary | ICD-10-CM

## 2021-01-03 DIAGNOSIS — I2510 Atherosclerotic heart disease of native coronary artery without angina pectoris: Secondary | ICD-10-CM

## 2021-01-03 DIAGNOSIS — R634 Abnormal weight loss: Secondary | ICD-10-CM

## 2021-01-03 DIAGNOSIS — I10 Essential (primary) hypertension: Secondary | ICD-10-CM

## 2021-01-03 DIAGNOSIS — Z0000 Encounter for general adult medical examination without abnormal findings: Secondary | ICD-10-CM | POA: Insufficient documentation

## 2021-01-03 DIAGNOSIS — I639 Cerebral infarction, unspecified: Secondary | ICD-10-CM | POA: Insufficient documentation

## 2021-01-03 DIAGNOSIS — K219 Gastro-esophageal reflux disease without esophagitis: Secondary | ICD-10-CM

## 2021-01-03 DIAGNOSIS — E559 Vitamin D deficiency, unspecified: Secondary | ICD-10-CM

## 2021-01-03 DIAGNOSIS — E079 Disorder of thyroid, unspecified: Secondary | ICD-10-CM

## 2021-01-03 DIAGNOSIS — I739 Peripheral vascular disease, unspecified: Secondary | ICD-10-CM

## 2021-01-03 DIAGNOSIS — E1100 Type 2 diabetes mellitus with hyperosmolarity without nonketotic hyperglycemic-hyperosmolar coma (NKHHC): Secondary | ICD-10-CM

## 2021-01-03 DIAGNOSIS — Z951 Presence of aortocoronary bypass graft: Secondary | ICD-10-CM

## 2021-01-03 DIAGNOSIS — R609 Edema, unspecified: Secondary | ICD-10-CM

## 2021-01-03 DIAGNOSIS — I6529 Occlusion and stenosis of unspecified carotid artery: Secondary | ICD-10-CM

## 2021-01-03 DIAGNOSIS — M79662 Pain in left lower leg: Secondary | ICD-10-CM

## 2021-01-03 DIAGNOSIS — I219 Acute myocardial infarction, unspecified: Secondary | ICD-10-CM

## 2021-01-03 HISTORY — DX: Peripheral vascular disease, unspecified: I73.9

## 2021-01-03 MED ORDER — CLOPIDOGREL 75 MG TABLET
ORAL_TABLET | ORAL | 3 refills | Status: DC
Start: 2021-01-03 — End: 2021-10-22

## 2021-01-03 MED ORDER — LEVOTHYROXINE 25 MCG TABLET
25.0000 ug | ORAL_TABLET | Freq: Every day | ORAL | 3 refills | Status: DC
Start: 2021-01-03 — End: 2021-10-22

## 2021-01-03 MED ORDER — METFORMIN 500 MG TABLET
ORAL_TABLET | ORAL | 3 refills | Status: DC
Start: 2021-01-03 — End: 2021-05-02

## 2021-01-03 MED ORDER — DICLOFENAC 1 % TOPICAL GEL
2.0000 g | Freq: Three times a day (TID) | CUTANEOUS | 3 refills | Status: DC | PRN
Start: 2021-01-03 — End: 2021-10-22

## 2021-01-03 MED ORDER — ERGOCALCIFEROL (VITAMIN D2) 1,250 MCG (50,000 UNIT) CAPSULE
ORAL_CAPSULE | ORAL | 2 refills | Status: DC
Start: 2021-01-03 — End: 2021-10-22

## 2021-01-03 MED ORDER — FUROSEMIDE 40 MG TABLET
ORAL_TABLET | ORAL | 3 refills | Status: DC
Start: 2021-01-03 — End: 2021-10-22

## 2021-01-03 MED ORDER — METOPROLOL SUCCINATE ER 25 MG TABLET,EXTENDED RELEASE 24 HR
12.5000 mg | ORAL_TABLET | Freq: Every day | ORAL | 3 refills | Status: DC
Start: 2021-01-03 — End: 2021-10-22

## 2021-01-03 MED ORDER — ROSUVASTATIN 20 MG TABLET
ORAL_TABLET | ORAL | 3 refills | Status: DC
Start: 2021-01-03 — End: 2021-03-19

## 2021-01-03 MED ORDER — LISINOPRIL 2.5 MG TABLET
ORAL_TABLET | ORAL | 3 refills | Status: DC
Start: 2021-01-03 — End: 2021-09-12

## 2021-01-03 NOTE — Progress Notes (Signed)
Patient Name: Monique [name]  Date: 01/03/2021  CC[facility] [place]  [place]  [place] [address]  [phone]  MRN: [id number]  DOB: 08/12/1966    Chief complaint: The patient is a 55 y.o. old female who came in today for Diabetes and med refill.    HPI:    Diabetes: Last HbA1C was  greater than 9%.  Patient denies symptoms of polydipsia, polyuria, hypoglycemic unawareness, chest pain, dyspnea on exertion, diarrhea, foot ulcerations, paresthesias. Symptoms are not changed. Patient's home blood sugars are poorly controlled. Patient is compliant with medications.  She request refills today.  She is referred herself to an endocrinologist in town.  He is not our system.  He started [place] and Ozempic.  He has offered her samples.    Since our last encounter she has had a home sleep study completed.  Inconclusive secondary to malfunction.    ROS:  Review of systems completed with all positives and pertinent negatives as per HPI.   Otherwise, negative.      Past medical history:  Past Medical History:   Diagnosis Date    Anxiety     Carotid stenosis     Chronic back pain     Coronary artery disease involving native coronary artery of native heart     DDD (degenerative disc disease), lumbar     "entire spine"    Ear piercing     GERD (gastroesophageal reflux disease)     H/O complete eye exam     2 years, Dr. [name], at [facility]    History of dental examination     dentures upper and lower    HTN     Hypercholesterolemia     Hyperlipidemia     MI (myocardial infarction) (CMS [facility]) 2012    Neck problem     herniated cervical disc    Obesity (BMI 30-39.9)     Solitary nodule of right lobe of thyroid 03/30/2018    Incidental finding on MRI thoracic spine. [place] ordered.     Stroke (CMS [facility])     2002    Tattoo     Left breast, Right shoulder    Thyroid disorder     Thyroid nodule     Uncontrolled type 2 diabetes mellitus, without long-term current use of  insulin (CMS [facility])     Xanthelasma      Past surgical history:  Past Surgical History:   Procedure Laterality Date    ENDOMETRIAL ABLATION  1998    HX ANKLE FRACTURE [place]    Right, Doran Durand procedure    HX CATARACT REMOVAL Bilateral 2013    r and l eyes with implants    HX CESAREAN SECTION  06/20/2000    x3, 11/21/86, 11/10/84    HX CHOLECYSTECTOMY  1988    HX CORONARY ARTERY BYPASS GRAFT  03/31/2011    cardiac, 2 vessel, Dr. [name], [place]  2012    x4 stents    HX GASTRIC SLEEVE  02/2017    [place], [place], Dr. [name] HEART CATHETERIZATION  2012    [facility] heart [facility] stents - [facility]    HX THYROID BIOPSY      HX THYROIDECTOMY  101/01/2018    HX TONSILLECTOMY      as child    HX YAG Left 09/20/2013     Medications:  Current Outpatient Medications  Medication Sig    ACCU-CHEK AVIVA PLUS TEST STRP Strip USE 1 STRIP TO TEST BLOOD SUGAR THREE TIMES A DAY    Blood-Glucose Meter (ACCU-CHEK AVIVA PLUS METER) Misc 1 Kit by Does not apply route Three times a day    clopidogreL (PLAVIX) 75 mg Oral Tablet TAKE 1 TABLET BY MOUTH ONCE DAILY    cyanocobalamin (VITAMIN B12) 1,000 mcg/mL Injection Solution 1 mL (1,000 mcg total) by Subcutaneous route Every 30 days    diclofenac sodium (VOLTAREN) 1 % Gel Apply 2 g topically Three times a day as needed    docusate sodium (COLACE) 100 mg Oral Capsule Take 1 Cap (100 mg total) by mouth Twice daily    empagliflozin (JARDIANCE) 10 mg Oral Tablet Take 10 mg by mouth Once a day    ergocalciferol, vitamin D2, (DRISDOL) 1,250 mcg (50,000 unit) Oral Capsule TAKE 1 CAPSULE BY MOUTH EVERY WEEK    furosemide (LASIX) 40 mg Oral Tablet TAKE 1 TABLET(40 MG) BY MOUTH EVERY DAY    Ibuprofen (MOTRIN) 800 mg Oral Tablet TAKE 1 TABLET(800 MG) BY MOUTH THREE TIMES DAILY AS NEEDED FOR PAIN    levothyroxine (SYNTHROID) 25 mcg Oral Tablet Take 1 Tablet (25 mcg total) by mouth Once a day    lisinopriL (PRINIVIL) 2.5 mg Oral  Tablet TAKE 1 TABLET BY MOUTH EVERY DAY    metFORMIN (GLUCOPHAGE) 500 mg Oral Tablet TAKE 1 TABLET BY MOUTH THREE TIMES DAILY WITH MEALS    metoprolol succinate (TOPROL-XL) 25 mg Oral Tablet Sustained Release 24 hr Take 0.5 Tablets (12.5 mg total) by mouth Once a day    NARCAN 4 mg/actuation Nasal Spray, Non-Aerosol PT STATED SHE HAS NARCAN AT HOME BUT IS NOT TAKING IT    niacin (NIASPAN) 500 mg Oral Tablet Sustained Release Take 1 Tab (500 mg total) by mouth Once a day    nitroGLYCERIN (NITROSTAT) 0.4 mg Sublingual Tablet, Sublingual 1 Tab (0.4 mg total) by Sublingual route Every 5 minutes as needed for Chest pain for 3 doses over 15 minutes    omeprazole (PRILOSEC) 40 mg Oral Capsule, Delayed Release(E.C.) TAKE 1 CAPSULE BY MOUTH ONCE DAILY    ondansetron (ZOFRAN ODT) 8 mg Oral Tablet, Rapid Dissolve DISSOLVE 1 TABLET(8 MG) ON THE TONGUE EVERY 8 HOURS AS NEEDED FOR NAUSEA OR VOMITING    PRENATAL PLUS, CALCIUM CARB, 27 mg iron- 1 mg Oral Tablet Take 1 Tab by mouth Once a day    PROVENTIL HFA 90 mcg/actuation Inhalation HFA Aerosol Inhaler Take 1-2 Puffs by inhalation Every 4 hours as needed    rosuvastatin (CRESTOR) 20 mg Oral Tablet TAKE 1 TABLET BY MOUTH ONCE DAILY    semaglutide (OZEMPIC) 0.25 mg or 0.5 mg(2 mg/1.5 mL) Subcutaneous Pen Injector Inject 0.25 mg under the skin Every 7 days    venlafaxine (EFFEXOR XR) 37.5 mg Oral Capsule, Sust. Release 24 hr TAKE 1 CAPSULE BY MOUTH EVERY DAY    VENTOLIN HFA 90 mcg/actuation Inhalation HFA Aerosol Inhaler Take 1-2 Puffs by inhalation Every 6 hours as needed    ZYRTEC-D 5-120 mg Oral Tablet Sustained Release 12 hr Take 1 Tab by mouth Once a day     Vitals:    01/03/21 1027   BP: 118/66   Pulse: 64   Resp: 16   Temp: 36 C (96.8 F)   SpO2: 96%   Weight: 101 kg (222 lb 8 oz)   Height: 1.676 m (_0 )   BMI: 35.99  Three year weight curve.  Last encounter obviously an outlier.      Physical Examination:    GENERAL:   Pt is a pleasant,  well-nourished, well-developed 55 y.o. female who is in NAD. Appears stated age  15:  head normocephalic, symmetrical facies. EOM intact b/l. PERRLA. Sclera non-icteric, non-injected. upper eyelid w/Xythoma-lipid plaques, diminished in fullness and intensity-in fact nearly resolved..  No rhinorrhea. Oropharyngeal mucous membranes are moist  w/o erythema/exudates   NECK:   No masses, no lymphadenopathy, no JVD on exam. Trachea midline, no thyromegaly   CV: S1, S2. No murmurs, rubs, gallops.     LUNGS: CTAB, No rhonchi, rales, wheezes.   GI: (+) BS in all 4 quadrants. soft, NT/ND. No rigidity/positive for guarding/negative for rebound. No organomegaly/masses, or abdominal bruits, complains of epigastric and right upper quadrant pain.  Cannot be provoked on exam.  MSK: Spontaneous normal AROM of major joints as observed during exam. No joint effusions/ swelling/ deformities.   No BLE edema, clubbing, or cyanosis.   2+ radial pulse present b/l.   + 2 reflexes Brachioradialis and patellar bilaterally.   Gait normal.  SKIN: No significant lesions, rashes, ecchymoses noted.  Just above the supraorbital ridge is a 0.75 cm round raised pink lesion.  Is hypervascular its margins.  There is some central depression.  This has the appearance of a basal cell, however, patient notes that this here more than 10 years and it is only very slowly changing.  NEURO: AAOx4, CN grossly intact. Sensation intact b/l. No focal deficits.  PSYCH: Mood, behavior and affect normal w/ Intact judgement & insight  Exam stable.       01/03/21 1028   Depression Screen   Little interest or pleasure in doing things. 0   Feeling down, depressed, or hopeless 0   PHQ 2 Total 0       Diabetes Monitors    CBC  Diff   Lab Results   Component Value Date/Time    WBC 10.8 01/30/2020 12:13 PM    HGB 14.2 01/30/2020 12:13 PM    HCT 43.9 01/30/2020 12:13 PM    PLTCNT 382 01/30/2020 12:13 PM    RBC 4.84 01/30/2020 12:13 PM    MCV 90.7 01/30/2020 12:13 PM    MCHC  32.3 01/30/2020 12:13 PM    MCH 29.3 01/30/2020 12:13 PM    RDW 13.3 08/30/2018 09:07 AM    MPV 10.2 01/30/2020 12:13 PM    Lab Results   Component Value Date/Time    PMNS 53 08/30/2018 09:07 AM    LYMPHOCYTES 33 08/30/2018 09:07 AM    EOSINOPHIL 3 08/30/2018 09:07 AM    MONOCYTES 11 (H) 08/30/2018 09:07 AM    BASOPHILS 1 08/30/2018 09:07 AM    BASOPHILS 0.10 08/30/2018 09:07 AM    PMNABS 4.60 08/30/2018 09:07 AM    LYMPHSABS 2.90 08/30/2018 09:07 AM    EOSABS 0.20 08/30/2018 09:07 AM    MONOSABS 0.90 (H) 08/30/2018 09:07 AM    BASOSABS 0.034 04/01/2011 05:08 AM          COMPREHENSIVE METABOLIC PANEL - NON FASTING  Lab Results   Component Value Date    SODIUM 140 07/12/2020    POTASSIUM 4.2 07/12/2020    CHLORIDE 106 07/12/2020    CO2 22 07/12/2020    ANIONGAP 12 07/12/2020    BUN 9 07/12/2020    CREATININE 1.03 07/12/2020    GLUCOSENF 219 (H) 07/12/2020    CALCIUM 8.4 (L) 07/12/2020  ALBUMIN 3.4 (L) 01/30/2020    TOTALPROTEIN 6.2 (L) 01/30/2020    ALKPHOS 102 01/30/2020    AST 21 01/30/2020    ALT 15 01/30/2020       A1C: 9.9  A1C Date: 07/04/2020         9.6 today.  Nephropathy Screening: On ACEI or ARB  Lab Results   Component Value Date    CHOLESTEROL 131 07/12/2020    HDLCHOL 43 (L) 07/12/2020    LDLCHOL 61 07/12/2020    TRIG 136 07/12/2020      Retinal Exam Date: 02/01/2019  Last diabetic foot exam: 06/01/2019     Visit Diagnosis    Encounter Diagnoses   Name Primary?    Type 2 diabetes mellitus with complication, without long-term current use of insulin (CMS [facility]) Yes    Healthcare maintenance     Stenosis of carotid artery, unspecified laterality     Pain     Vitamin D deficiency     Edema, unspecified type     Thyroid disorder     Major depressive disorder, recurrent, unspecified (CMS [facility])     Type 2 diabetes mellitus with hyperosmolarity without coma, without long-term current use of insulin (CMS [facility])     Peripheral vascular disease, unspecified (CMS [facility])     Morbid obesity (CMS [facility])      Cerebrovascular accident (CVA), unspecified mechanism (CMS [facility])     Myocardial infarction, unspecified MI type, unspecified artery (CMS [facility])     Primary hypertension      Orders Placed This Encounter    MAMMO BILATERAL SCREENING-ADDL VIEWS/BREAST US AS REQ BY RAD    POCT HGB A1C    clopidogreL (PLAVIX) 75 mg Oral Tablet    diclofenac sodium (VOLTAREN) 1 % Gel    ergocalciferol, vitamin D2, (DRISDOL) 1,250 mcg (50,000 unit) Oral Capsule    furosemide (LASIX) 40 mg Oral Tablet    levothyroxine (SYNTHROID) 25 mcg Oral Tablet    lisinopriL (PRINIVIL) 2.5 mg Oral Tablet    metoprolol succinate (TOPROL-XL) 25 mg Oral Tablet Sustained Release 24 hr    metFORMIN (GLUCOPHAGE) 500 mg Oral Tablet    rosuvastatin (CRESTOR) 20 mg Oral Tablet       Type 2 diabetes mellitus with complication, without long-term current use of insulin (CMS [facility])  Chronic worsening  control  HbA1C greater than 10%.  Should improve now that she is on stable meds.  She will continue work on diet.  Continue to address weight loss which is stalled.  Continue metformin 500 mg to t.i.d..  Stopped Trulicity for cost and difficulty with compliance.  Patient has been prescribed Ozempic.  Patient now on Jardiance.  Stopped Glyxambi 10-5 mg.    Encouraged her to check blood sugars 3-4 times daily.  Discuss renal concerns and need for monitoring.   Repeat labs next encounter.    Coronary artery disease  Status post CABG  Continue Plavix  Continue beta-blocker  Continue statin.  Continue sublingual nitro p.r.n. for chest pain.  Continue with Cardiology.    Depression, unspecified depression type  Chronic, controlled.  Continue Effexor    Weight loss:  Has lost significant weight since our last encounter.  Continue behavioral efforts.    GERD  Chronic, Uncontrolled problem   Encouraged lifestyle modification to help with acid reflux, including:   Weight loss   Avoid eating for 2-3 hours prior to bedtime. Avoid late night snacking.    Avoid hot/spicy  and acidic foods and overeating.  Remain upright after eating, avoid tight clothing.    Elevate head of bed.   Chew gum or lozenges.   Refrain from alcohol and smoking.   Continue histamine blocker.  Continue  proton pump inhibitor.  Has appointment General surgery for upper GI scope.  She will attend to the emergency room if her symptoms warrant prior to that encounter.      Chronic left-sided lower extremity pain  We review her imaging to her satisfaction.  Copies of the results are offered to the patient.  Patient with osteoarthritis  Has comorbid peripheral vascular disease  Will increase her statin follow.  Patient has follow-ups with orthopedics for the hip and knee.  Continue follow-up with Dr. Burman Riis in [facility] for low back pain.    We again discussed use of weight loss as a primary method for pain relief.  Continue on muscle relaxer.  Patient avoids nonsteroidals secondary to her bariatric surgery.  Labs have been reassuring.    Carotid Art Stenosis:   50% stenosis to the right ICA  100% stenosis to left ICA.  Continue with Cardiology, Interventional Cardiology  Has had follow-ups with vascular surgery scheduled but has not completed those encounters.    Will follow.    Right upper extremity 5th digit fracture.  Not specifically addressed.  Resolved.    Right facial nevus  Would benefit from biopsy.  Referral as above.  Will follow.    OSA:  Continue with Neurology.      Immunization requested:  At the patient's request we drafted a letter to allow her to obtain immunization for COVID as soon as possible.    Return in about 3 months (around 04/03/2021), or if symptoms worsen or fail to improve, for In Person Visit, DM, HTN-CAD.    Cherly Beach, MD  01/03/2021, 11:15    This note was partially generated using MModal Fluency Direct system, and there may be some incorrect words, spellings, and punctuation that were not noted in checking the note before saving.

## 2021-01-03 NOTE — Nursing Note (Signed)
01/03/21 1028   Depression Screen   Little interest or pleasure in doing things. 0   Feeling down, depressed, or hopeless 0   PHQ 2 Total 0

## 2021-01-11 ENCOUNTER — Ambulatory Visit (HOSPITAL_BASED_OUTPATIENT_CLINIC_OR_DEPARTMENT_OTHER): Payer: Commercial Managed Care - PPO

## 2021-01-17 ENCOUNTER — Ambulatory Visit (HOSPITAL_BASED_OUTPATIENT_CLINIC_OR_DEPARTMENT_OTHER): Payer: Commercial Managed Care - PPO

## 2021-01-17 ENCOUNTER — Other Ambulatory Visit: Payer: Self-pay

## 2021-01-17 ENCOUNTER — Ambulatory Visit (INDEPENDENT_AMBULATORY_CARE_PROVIDER_SITE_OTHER): Payer: Commercial Managed Care - PPO | Admitting: PSYCHIATRY AND NEUROLOGY-NEUROLOGY

## 2021-01-17 VITALS — BP 116/68 | Ht 66.0 in | Wt 210.0 lb

## 2021-01-17 DIAGNOSIS — G4733 Obstructive sleep apnea (adult) (pediatric): Secondary | ICD-10-CM

## 2021-01-17 NOTE — Progress Notes (Signed)
[facility]  [place]  [place] [address]     Consultation    Name: Monique [name] MRN:  [id number]   Date: 01/17/2021 Age: 55 y.o.     Referring Provider:  No ref. provider found  Primary Care Provider: Cherly Beach, MD       Chief Complaint: Sleep Apnea    History of Present Illness   Monique [name] is a 55 y.o. year old female who comes to clinic for follow-up of a diagnostic HST on 12/06.  Unfortunately, technical problems rendered it nondiagnostic.  She had her husband still have a high preference for staying away from the hospital or sleep laboratory because they are caring for her 54 year old mother and fear exposure of her to [place].  She did not think she had any difficulty following instructions to set up the measuring equipment.    Patient Active Problem List    Diagnosis   . Major depressive disorder, recurrent, unspecified (CMS HCC)   . Peripheral vascular disease, unspecified (CMS [place])   . Fracture of phalanx of finger of right hand   . Spinal pain   . Type 2 diabetes mellitus with hyperglycemia (CMS HCC)   . Upper respiratory infection   . Thyroid disorder   . UDS: 07/28/19   . Sinus congestion   . Morbid obesity (CMS [place])   . Type 2 diabetes mellitus with hyperosmolarity without coma, without long-term current use of insulin (CMS HCC)   . Coronary artery disease involving native coronary artery of native heart   . GERD (gastroesophageal reflux disease)   . HTN (hypertension)   . Hyperlipidemia   . Stroke (CMS [facility])   . Constipation   . Thyroid nodule   . Chest pain   . DDD (degenerative disc disease), lumbar   . Anxiety   . Chronic back pain   . Carotid stenosis   . BMI 35.0-35.9,adult   . H/O complete eye exam   . S/P CABG x 2   . Xanthelasma   . MI (myocardial infarction) (CMS [place])   . Hypercholesterolemia     Past Medical History:   Diagnosis Date   . Anxiety    . Carotid stenosis    . Chronic back pain    . Coronary artery disease involving native  coronary artery of native heart    . DDD (degenerative disc disease), lumbar     "entire spine"   . Ear piercing    . GERD (gastroesophageal reflux disease)    . H/O complete eye exam     2 years, Dr. [name], at [facility]   . History of dental examination     dentures upper and lower   . HTN    . Hypercholesterolemia    . Hyperlipidemia    . MI (myocardial infarction) (CMS [place]) 2012   . Neck problem     herniated cervical disc   . Obesity (BMI 30-39.9)    . 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 [place] ordered.    . Stroke (CMS [facility])     2002   . Tattoo     Left breast, Right shoulder   . Thyroid disorder    . Thyroid nodule    . Uncontrolled type 2 diabetes mellitus, without long-term current use of insulin (CMS [place])    . Xanthelasma          Past Surgical History:  Procedure Laterality Date   . ENDOMETRIAL ABLATION  1998   . HX ANKLE FRACTURE TX  1990s    Right, Doran Durand procedure   . HX CATARACT REMOVAL Bilateral 2013    r and l eyes with implants   . HX CESAREAN SECTION  06/20/2000    x3, 11/21/86, 11/10/84   . HX CHOLECYSTECTOMY  1988   . HX CORONARY ARTERY BYPASS GRAFT  03/31/2011    cardiac, 2 vessel, Dr. [name], [facility]   . HX CORONARY STENT PLACEMENT  2012    x4 stents   . HX GASTRIC SLEEVE  02/2017    [place], [place], Dr. Pearlie Oyster   . HX HEART CATHETERIZATION  2012    mass[facility] heart attack and stents - [facility]   . HX THYROID BIOPSY     . HX THYROIDECTOMY  101/01/2018   . HX TONSILLECTOMY      as child   . HX YAG Left 09/20/2013         Current Outpatient Medications   Medication Sig   . ACCU-CHEK AVIVA PLUS TEST STRP Strip USE 1 STRIP TO TEST BLOOD SUGAR THREE TIMES A DAY   . Blood-Glucose Meter (ACCU-CHEK AVIVA PLUS METER) Misc 1 Kit by Does not apply route Three times a day   . clopidogreL (PLAVIX) 75 mg Oral Tablet TAKE 1 TABLET BY MOUTH ONCE DAILY   . cyanocobalamin (VITAMIN B12) 1,000 mcg/mL Injection Solution 1 mL (1,000 mcg total) by Subcutaneous  route Every 30 days   . diclofenac sodium (VOLTAREN) 1 % Gel Apply 2 g topically Three times a day as needed   . docusate sodium (COLACE) 100 mg Oral Capsule Take 1 Cap (100 mg total) by mouth Twice daily   . empagliflozin (JARDIANCE) 10 mg Oral Tablet Take 10 mg by mouth Once a day   . ergocalciferol, vitamin D2, (DRISDOL) 1,250 mcg (50,000 unit) Oral Capsule TAKE 1 CAPSULE BY MOUTH EVERY WEEK   . furosemide (LASIX) 40 mg Oral Tablet TAKE 1 TABLET(40 MG) BY MOUTH EVERY DAY   . Ibuprofen (MOTRIN) 800 mg Oral Tablet TAKE 1 TABLET(800 MG) BY MOUTH THREE TIMES DAILY AS NEEDED FOR PAIN   . levothyroxine (SYNTHROID) 25 mcg Oral Tablet Take 1 Tablet (25 mcg total) by mouth Once a day   . lisinopriL (PRINIVIL) 2.5 mg Oral Tablet TAKE 1 TABLET BY MOUTH EVERY DAY   . metFORMIN (GLUCOPHAGE) 500 mg Oral Tablet TAKE 1 TABLET BY MOUTH THREE TIMES DAILY WITH MEALS   . metoprolol succinate (TOPROL-XL) 25 mg Oral Tablet Sustained Release 24 hr Take 0.5 Tablets (12.5 mg total) by mouth Once a day   . NARCAN 4 mg/actuation Nasal Spray, Non-Aerosol PT STATED SHE HAS NARCAN AT HOME BUT IS NOT TAKING IT   . niacin (NIASPAN) 500 mg Oral Tablet Sustained Release Take 1 Tab (500 mg total) by mouth Once a day   . nitroGLYCERIN (NITROSTAT) 0.4 mg Sublingual Tablet, Sublingual 1 Tab (0.4 mg total) by Sublingual route Every 5 minutes as needed for Chest pain for 3 doses over 15 minutes   . omeprazole (PRILOSEC) 40 mg Oral Capsule, Delayed Release(E.C.) TAKE 1 CAPSULE BY MOUTH ONCE DAILY   . ondansetron (ZOFRAN ODT) 8 mg Oral Tablet, Rapid Dissolve DISSOLVE 1 TABLET(8 MG) ON THE TONGUE EVERY 8 HOURS AS NEEDED FOR NAUSEA OR VOMITING   . PRENATAL PLUS, CALCIUM CARB, 27 mg iron- 1 mg Oral Tablet Take 1 Tab by mouth Once a day   .  PROVENTIL HFA 90 mcg/actuation Inhalation HFA Aerosol Inhaler Take 1-2 Puffs by inhalation Every 4 hours as needed   . rosuvastatin (CRESTOR) 20 mg Oral Tablet TAKE 1 TABLET BY MOUTH ONCE DAILY   . semaglutide (OZEMPIC)  0.25 mg or 0.5 mg(2 mg/1.5 mL) Subcutaneous Pen Injector Inject 0.25 mg under the skin Every 7 days   . venlafaxine (EFFEXOR XR) 37.5 mg Oral Capsule, Sust. Release 24 hr TAKE 1 CAPSULE BY MOUTH EVERY DAY   . VENTOLIN HFA 90 mcg/actuation Inhalation HFA Aerosol Inhaler Take 1-2 Puffs by inhalation Every 6 hours as needed   . ZYRTEC-D 5-120 mg Oral Tablet Sustained Release 12 hr Take 1 Tab by mouth Once a day     Allergies   Allergen Reactions   . Ane Payment [Pyrilamine-Dextromethorphan]  Other Adverse Reaction (Add comment)     LIGHT HEADED   . Tylenol W Codeine [Acetaminophen-Codeine]  Other Adverse Reaction (Add comment)     pt states that one time she had chest pains with this med, but dr. thought it was because she had not eaten     Family Medical History:     Problem Relation (Age of Onset)    Atrial fibrillation Sister    Colon Cancer Father    Diabetes Mother, Maternal Aunt    Heart Disease Sister, Maternal Grandfather    Hypertension (High Blood Pressure) Mother    Lung Cancer Maternal Grandfather    MI <37 years of age Mother          Social History     Tobacco Use   . Smoking status: Current Every Day Smoker     Packs/day: 0.50     Years: 20.00     Pack years: 10.00     Types: Cigarettes     Start date: 40   . Smokeless tobacco: Never Used   Substance Use Topics   . Alcohol use: No      Review of Systems    Examination:  BP 116/68   Ht 1.676 m (5' 6") Comment: per patient  Wt 95.3 kg (210 lb) Comment: per patient  LMP  (LMP Unknown)   BMI 33.89 kg/m       She is awake, alert, and is in no distress.  Face mask in place.  Interacts appropriately.    Data reviewed:  HST 11/12/2020    Assessment and Recommendations  Problem List Items Addressed This Visit    None     Visit Diagnoses     OSA (obstructive sleep apnea)    -  Primary    Relevant Orders    POLYSOMNOGRAPHY - PSG NIGHT 1        Loud snoring and possible obstructive sleep apnea.  Plan repeat HS T, follow-up accordingly, call 1 week after  delivering equipment back to sleep lab    Etta Quill Elliot Gurney., MD

## 2021-01-22 ENCOUNTER — Other Ambulatory Visit (HOSPITAL_BASED_OUTPATIENT_CLINIC_OR_DEPARTMENT_OTHER): Payer: Self-pay | Admitting: PSYCHIATRY AND NEUROLOGY-NEUROLOGY

## 2021-01-22 DIAGNOSIS — G4733 Obstructive sleep apnea (adult) (pediatric): Secondary | ICD-10-CM

## 2021-01-31 ENCOUNTER — Encounter (INDEPENDENT_AMBULATORY_CARE_PROVIDER_SITE_OTHER): Payer: Self-pay | Admitting: PSYCHIATRY AND NEUROLOGY-NEUROLOGY

## 2021-02-06 ENCOUNTER — Ambulatory Visit
Admission: RE | Admit: 2021-02-06 | Discharge: 2021-02-06 | Disposition: A | Discharge status: Home / Self Care | Payer: Commercial Managed Care - PPO | Source: Ambulatory Visit | Attending: Family Medicine | Admitting: Family Medicine

## 2021-02-06 ENCOUNTER — Other Ambulatory Visit: Payer: Self-pay

## 2021-02-06 ENCOUNTER — Encounter (HOSPITAL_BASED_OUTPATIENT_CLINIC_OR_DEPARTMENT_OTHER): Payer: Self-pay

## 2021-02-06 DIAGNOSIS — Z1231 Encounter for screening mammogram for malignant neoplasm of breast: Secondary | ICD-10-CM | POA: Insufficient documentation

## 2021-02-06 DIAGNOSIS — Z0000 Encounter for general adult medical examination without abnormal findings: Secondary | ICD-10-CM

## 2021-02-06 NOTE — Result Encounter Note (Signed)
Please inform the patient that her mammogram has been returned.    There is no evidence of worrisome mass or lesion.    The radiologist recommended repeat mammogram in 1 year.    Will review this together next appointment.  TY TH

## 2021-02-13 ENCOUNTER — Other Ambulatory Visit (HOSPITAL_BASED_OUTPATIENT_CLINIC_OR_DEPARTMENT_OTHER): Payer: Self-pay | Admitting: Family Medicine

## 2021-02-13 DIAGNOSIS — E538 Deficiency of other specified B group vitamins: Secondary | ICD-10-CM

## 2021-02-14 NOTE — Telephone Encounter (Signed)
L-01/03/21  N-unknown

## 2021-02-21 ENCOUNTER — Ambulatory Visit (HOSPITAL_BASED_OUTPATIENT_CLINIC_OR_DEPARTMENT_OTHER): Payer: Commercial Managed Care - PPO

## 2021-03-12 ENCOUNTER — Other Ambulatory Visit (HOSPITAL_BASED_OUTPATIENT_CLINIC_OR_DEPARTMENT_OTHER): Payer: Self-pay | Admitting: Family Medicine

## 2021-03-12 DIAGNOSIS — I70221 Atherosclerosis of native arteries of extremities with rest pain, right leg: Secondary | ICD-10-CM

## 2021-03-13 ENCOUNTER — Encounter (INDEPENDENT_AMBULATORY_CARE_PROVIDER_SITE_OTHER): Payer: Self-pay

## 2021-03-13 ENCOUNTER — Ambulatory Visit (INDEPENDENT_AMBULATORY_CARE_PROVIDER_SITE_OTHER): Payer: Commercial Managed Care - PPO | Admitting: NURSE PRACTITIONER

## 2021-03-13 ENCOUNTER — Other Ambulatory Visit (INDEPENDENT_AMBULATORY_CARE_PROVIDER_SITE_OTHER): Payer: Self-pay

## 2021-03-13 ENCOUNTER — Other Ambulatory Visit: Payer: Self-pay

## 2021-03-13 VITALS — BP 112/60 | HR 88 | Temp 97.1°F | Resp 20 | Ht 66.0 in | Wt 220.0 lb

## 2021-03-13 DIAGNOSIS — H6692 Otitis media, unspecified, left ear: Secondary | ICD-10-CM

## 2021-03-13 DIAGNOSIS — J329 Chronic sinusitis, unspecified: Secondary | ICD-10-CM

## 2021-03-13 MED ORDER — CEFDINIR 300 MG CAPSULE
300.0000 mg | ORAL_CAPSULE | Freq: Two times a day (BID) | ORAL | 0 refills | Status: DC
Start: 2021-03-13 — End: 2021-03-25

## 2021-03-13 NOTE — Progress Notes (Signed)
[address], [facility]  [address]  [place] [address]    Progress Note    Name: Monique [name] MRN:  [id number]   Date: 03/13/2021 Age: 55 y.o.         Reason for Visit: Ear Pain (Bil. Earache---left worse than right.  Sneezing, H/A---has had symptoms 3-4 days)    History of Present Illness  Monique [name] is a 55 y.o. female who is being seen today for The above stated symptoms, denies alleviating factors.      Nursing Notes:  There are no exam notes on file for this visit.    Past Medical History:   Diagnosis Date   . Anxiety    . Carotid stenosis    . Chronic back pain    . Coronary artery disease involving native coronary artery of native heart    . DDD (degenerative disc disease), lumbar     "entire spine"   . Ear piercing    . GERD (gastroesophageal reflux disease)    . H/O complete eye exam     2 years, Dr. [name], at [facility]   . History of dental examination     dentures upper and lower   . HTN    . Hypercholesterolemia    . Hyperlipidemia    . MI (myocardial infarction) (CMS [place]) 2012   . Neck problem     herniated cervical disc   . Obesity (BMI 30-39.9)    . 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 [place] ordered.    . Stroke (CMS [facility])     2002   . Tattoo     Left breast, Right shoulder   . Thyroid disorder    . Thyroid nodule    . Uncontrolled type 2 diabetes mellitus, without long-term current use of insulin (CMS [place])    . Xanthelasma          Past Surgical History:   Procedure Laterality Date   . ENDOMETRIAL ABLATION  1998   . HX ANKLE FRACTURE TX  1990s    Right, Doran Durand procedure   . HX CATARACT REMOVAL Bilateral 2013    r and l eyes with implants   . HX CESAREAN SECTION  06/20/2000    x3, 11/21/86, 11/10/84   . HX CHOLECYSTECTOMY  1988   . HX CORONARY ARTERY BYPASS GRAFT  03/31/2011    cardiac, 2 vessel, Dr. [name], [facility]   . HX CORONARY STENT PLACEMENT  2012    x4 stents   . HX GASTRIC SLEEVE  02/2017     [place], [place], Dr. Pearlie Oyster   . HX HEART CATHETERIZATION  2012    [facility] stents - [facility]   . HX THYROID BIOPSY     . HX THYROIDECTOMY  101/01/2018   . HX TONSILLECTOMY      as child   . HX YAG Left 09/20/2013         Current Outpatient Medications   Medication Sig   . ACCU-CHEK AVIVA PLUS TEST STRP Strip USE 1 STRIP TO TEST BLOOD SUGAR THREE TIMES A DAY   . Blood-Glucose Meter (ACCU-CHEK AVIVA PLUS METER) Misc 1 Kit by Does not apply route Three times a day   . cefdinir (OMNICEF) 300 mg Oral Capsule Take 1 Capsule (300 mg total) by mouth Twice daily for 10 days   . clopidogreL (PLAVIX) 75 mg Oral Tablet TAKE  1 TABLET BY MOUTH ONCE DAILY   . cyanocobalamin (VITAMIN B12) 1,000 mcg/mL Injection Solution ADMINISTER 1 ML(1000 MCG) UNDER THE SKIN EVERY 30 DAYS   . diclofenac sodium (VOLTAREN) 1 % Gel Apply 2 g topically Three times a day as needed   . docusate sodium (COLACE) 100 mg Oral Capsule Take 1 Cap (100 mg total) by mouth Twice daily   . empagliflozin (JARDIANCE) 10 mg Oral Tablet Take 10 mg by mouth Once a day   . ergocalciferol, vitamin D2, (DRISDOL) 1,250 mcg (50,000 unit) Oral Capsule TAKE 1 CAPSULE BY MOUTH EVERY WEEK   . furosemide (LASIX) 40 mg Oral Tablet TAKE 1 TABLET(40 MG) BY MOUTH EVERY DAY   . Ibuprofen (MOTRIN) 800 mg Oral Tablet TAKE 1 TABLET(800 MG) BY MOUTH THREE TIMES DAILY AS NEEDED FOR PAIN   . levothyroxine (SYNTHROID) 25 mcg Oral Tablet Take 1 Tablet (25 mcg total) by mouth Once a day   . lisinopriL (PRINIVIL) 2.5 mg Oral Tablet TAKE 1 TABLET BY MOUTH EVERY DAY   . metFORMIN (GLUCOPHAGE) 500 mg Oral Tablet TAKE 1 TABLET BY MOUTH THREE TIMES DAILY WITH MEALS   . metoprolol succinate (TOPROL-XL) 25 mg Oral Tablet Sustained Release 24 hr Take 0.5 Tablets (12.5 mg total) by mouth Once a day   . NARCAN 4 mg/actuation Nasal Spray, Non-Aerosol PT STATED SHE HAS NARCAN AT HOME BUT IS NOT TAKING IT   . niacin (NIASPAN) 500 mg Oral Tablet Sustained Release Take 1 Tab (500 mg total) by  mouth Once a day   . nitroGLYCERIN (NITROSTAT) 0.4 mg Sublingual Tablet, Sublingual 1 Tab (0.4 mg total) by Sublingual route Every 5 minutes as needed for Chest pain for 3 doses over 15 minutes   . omeprazole (PRILOSEC) 40 mg Oral Capsule, Delayed Release(E.C.) TAKE 1 CAPSULE BY MOUTH ONCE DAILY   . ondansetron (ZOFRAN ODT) 8 mg Oral Tablet, Rapid Dissolve DISSOLVE 1 TABLET(8 MG) ON THE TONGUE EVERY 8 HOURS AS NEEDED FOR NAUSEA OR VOMITING   . PRENATAL PLUS, CALCIUM CARB, 27 mg iron- 1 mg Oral Tablet Take 1 Tab by mouth Once a day   . PROVENTIL HFA 90 mcg/actuation Inhalation HFA Aerosol Inhaler Take 1-2 Puffs by inhalation Every 4 hours as needed   . rosuvastatin (CRESTOR) 20 mg Oral Tablet TAKE 1 TABLET BY MOUTH ONCE DAILY (Patient taking differently: 40 mg TAKE 1 TABLET BY MOUTH ONCE DAILY)   . semaglutide (OZEMPIC) 0.25 mg or 0.5 mg(2 mg/1.5 mL) Subcutaneous Pen Injector Inject 0.25 mg under the skin Every 7 days   . venlafaxine (EFFEXOR XR) 37.5 mg Oral Capsule, Sust. Release 24 hr TAKE 1 CAPSULE BY MOUTH EVERY DAY   . VENTOLIN HFA 90 mcg/actuation Inhalation HFA Aerosol Inhaler Take 1-2 Puffs by inhalation Every 6 hours as needed (Patient not taking: Reported on 03/13/2021)   . ZYRTEC-D 5-120 mg Oral Tablet Sustained Release 12 hr Take 1 Tab by mouth Once a day     Allergies   Allergen Reactions   . Ane Payment [Pyrilamine-Dextromethorphan]  Other Adverse Reaction (Add comment)     LIGHT HEADED   . Tylenol W Codeine [Acetaminophen-Codeine]  Other Adverse Reaction (Add comment)     pt states that one time she had chest pains with this med, but dr. thought it was because she had not eaten     Family Medical History:     Problem Relation (Age of Onset)    Atrial fibrillation Sister    Colon Cancer Father  Diabetes Mother, Maternal Aunt    Heart Disease Sister, Maternal Grandfather    Hypertension (High Blood Pressure) Mother    Lung Cancer Maternal Grandfather    MI <15 years of age Mother          Social History      Tobacco Use   . Smoking status: Current Every Day Smoker     Packs/day: 0.50     Years: 20.00     Pack years: 10.00     Types: Cigarettes     Start date: 77   . Smokeless tobacco: Never Used   Vaping Use   . Vaping Use: Never used   Substance Use Topics   . Alcohol use: No   . Drug use: No       Review of Systems  Review of Systems   Constitutional: Negative.    HENT: Positive for congestion, ear pain and sinus pain.    Respiratory: Negative.    Gastrointestinal: Negative.    Musculoskeletal: Negative.        Physical Exam:  BP 112/60   Pulse 88   Temp 36.2 C (97.1 F)   Resp 20   Ht 1.676 m (5' 6" )   Wt 99.8 kg (220 lb)   LMP  (LMP Unknown)   SpO2 97%   BMI 35.51 kg/m       Physical Exam  Vitals and nursing note reviewed.   Constitutional:       General: She is not in acute distress.  HENT:      Head: Normocephalic and atraumatic.      Right Ear: Tympanic membrane, ear canal and external ear normal.      Left Ear: Ear canal and external ear normal. Tympanic membrane is erythematous.      Nose:      Right Sinus: Maxillary sinus tenderness and frontal sinus tenderness present.      Left Sinus: Maxillary sinus tenderness and frontal sinus tenderness present.      Mouth/Throat:      Mouth: Mucous membranes are moist.      Pharynx: Oropharynx is clear.   Eyes:      Conjunctiva/sclera: Conjunctivae normal.   Cardiovascular:      Rate and Rhythm: Normal rate and regular rhythm.      Heart sounds: Normal heart sounds.   Pulmonary:      Effort: Pulmonary effort is normal. No respiratory distress.      Breath sounds: Normal breath sounds.   Lymphadenopathy:      Cervical: No cervical adenopathy.   Skin:     General: Skin is warm and dry.   Neurological:      Mental Status: She is alert and oriented to person, place, and time.   Psychiatric:         Mood and Affect: Affect normal.         Judgment: Judgment normal.         Assessment:    Sinusitis, unspecified chronicity, unspecified location    Left otitis  media, unspecified otitis media type        Plan:      Orders Placed This Encounter   . cefdinir (OMNICEF) 300 mg Oral Capsule       Follow up: No follow-ups on file.    Follow up with PCP.  Seek medical attention for new or worsening symptoms.    This note was partially created using MModal Fluency Direct system (voice recognition software) and is inherently  subject to errors including those of syntax and "sound-alike" substitutions which may escape proofreading.  In such instances, original meaning may be extrapolated by contextual derivation.     I saw the patient independently.            Kavin Leech, FNP  03/13/2021, 12:22

## 2021-03-18 ENCOUNTER — Encounter (INDEPENDENT_AMBULATORY_CARE_PROVIDER_SITE_OTHER): Payer: Self-pay | Admitting: PSYCHIATRY AND NEUROLOGY-NEUROLOGY

## 2021-03-19 ENCOUNTER — Encounter (INDEPENDENT_AMBULATORY_CARE_PROVIDER_SITE_OTHER): Payer: Self-pay | Admitting: Family Medicine

## 2021-03-19 ENCOUNTER — Other Ambulatory Visit (INDEPENDENT_AMBULATORY_CARE_PROVIDER_SITE_OTHER): Payer: Self-pay | Admitting: Family Medicine

## 2021-03-19 ENCOUNTER — Ambulatory Visit (INDEPENDENT_AMBULATORY_CARE_PROVIDER_SITE_OTHER): Payer: Commercial Managed Care - PPO | Attending: Family Medicine | Admitting: Family Medicine

## 2021-03-19 ENCOUNTER — Other Ambulatory Visit: Payer: Self-pay

## 2021-03-19 VITALS — BP 118/78 | HR 93 | Temp 96.0°F | Resp 16 | Ht 66.0 in | Wt 216.0 lb

## 2021-03-19 DIAGNOSIS — J343 Hypertrophy of nasal turbinates: Secondary | ICD-10-CM

## 2021-03-19 DIAGNOSIS — E538 Deficiency of other specified B group vitamins: Secondary | ICD-10-CM

## 2021-03-19 DIAGNOSIS — E118 Type 2 diabetes mellitus with unspecified complications: Secondary | ICD-10-CM

## 2021-03-19 DIAGNOSIS — I739 Peripheral vascular disease, unspecified: Secondary | ICD-10-CM

## 2021-03-19 DIAGNOSIS — I219 Acute myocardial infarction, unspecified: Secondary | ICD-10-CM

## 2021-03-19 DIAGNOSIS — E669 Obesity, unspecified: Secondary | ICD-10-CM

## 2021-03-19 DIAGNOSIS — I639 Cerebral infarction, unspecified: Secondary | ICD-10-CM

## 2021-03-19 MED ORDER — PSEUDOEPHEDRINE 30 MG TABLET
30.0000 mg | ORAL_TABLET | Freq: Three times a day (TID) | ORAL | 0 refills | Status: DC
Start: 2021-03-19 — End: 2021-05-08

## 2021-03-19 MED ORDER — CYANOCOBALAMIN (VIT B-12) 1,000 MCG/ML INJECTION SOLUTION
INTRAMUSCULAR | 2 refills | Status: DC
Start: 2021-03-19 — End: 2021-04-05

## 2021-03-19 MED ORDER — PSEUDOEPHEDRINE 30 MG TABLET
30.0000 mg | ORAL_TABLET | Freq: Three times a day (TID) | ORAL | 0 refills | Status: DC
Start: 2021-03-19 — End: 2021-03-19

## 2021-03-19 MED ORDER — ROSUVASTATIN 40 MG TABLET
40.0000 mg | ORAL_TABLET | Freq: Every day | ORAL | 2 refills | Status: DC
Start: 2021-03-19 — End: 2021-10-22

## 2021-03-19 NOTE — Nursing Note (Signed)
03/19/21 1131   Depression Screen   Little interest or pleasure in doing things. 0   Feeling down, depressed, or hopeless 0   PHQ 2 Total 0

## 2021-03-19 NOTE — Progress Notes (Signed)
FAMILY MEDICINE, [facility]  [place] [id number]       Name: Monique [name] MRN:  [id number]   Date: 03/19/2021 Age: 55 y.o.         Chief complaint: The patient is a 55 y.o. old female who came in today for Diabetes     HPI:    Diabetes: Last HbA1C was 9.6%?Marland Kitchen  Patient denies symptoms of polydipsia, polyuria, hypoglycemic unawareness, chest pain, dyspnea on exertion, diarrhea, foot ulcerations, paresthesias. Symptoms are not changed. Patient's home blood sugars are controlled.  She reports her fasting sugar this morning was 160. Patient is compliant with medications.  She continues to use Jardiance and Ozempic.       ROS:  Review of systems completed with all positives and pertinent negatives as per HPI.  Patient has been to [facility] since our last visit for concern of otitis media.  She saw Neurology on 01/17/2021.  Dr. Francella Solian started a workup for OSA.  She had appointment for sleep study when she was unable to keep.  Follow-up study was inconclusive.  She has a repeat study scheduled.  Otherwise, negative.    Past medical history:  Past Medical History:   Diagnosis Date    Anxiety     Carotid stenosis     Chronic back pain     Coronary artery disease involving native coronary artery of native heart     DDD (degenerative disc disease), lumbar     "entire spine"    Ear piercing     GERD (gastroesophageal reflux disease)     H/O complete eye exam     2 years, Dr. [name], at [facility]    History of dental examination     dentures upper and lower    HTN     Hypercholesterolemia     Hyperlipidemia     MI (myocardial infarction) (CMS [facility]) 2012    Neck problem     herniated cervical disc    Obesity (BMI 30-39.9)     Solitary nodule of right lobe of thyroid 03/30/2018    Incidental finding on MRI thoracic spine. [place] ordered.     Stroke (CMS [facility])     2002    Tattoo     Left breast, Right shoulder    Thyroid disorder     Thyroid nodule     Uncontrolled type 2  diabetes mellitus, without long-term current use of insulin (CMS HCC)     Xanthelasma      Past surgical history:  Past Surgical History:   Procedure Laterality Date    ENDOMETRIAL ABLATION  1998    HX ANKLE FRACTURE [place]    Right, Doran Durand procedure    HX CATARACT REMOVAL Bilateral 2013    r and l eyes with implants    HX CESAREAN SECTION  06/20/2000    x3, 11/21/86, 11/10/84    HX CHOLECYSTECTOMY  1988    HX CORONARY ARTERY BYPASS GRAFT  03/31/2011    cardiac, 2 vessel, Dr. [name], [place]  2012    x4 stents    HX GASTRIC SLEEVE  02/2017    [place], [place], Dr. [name] HEART CATHETERIZATION  2012    massive heart attack and stents - [facility]    HX THYROID BIOPSY      HX THYROIDECTOMY  101/01/2018    HX TONSILLECTOMY  as child    HX YAG Left 09/20/2013     Medications:  Current Outpatient Medications   Medication Sig    ACCU-CHEK AVIVA PLUS TEST STRP Strip USE 1 STRIP TO TEST BLOOD SUGAR THREE TIMES A DAY    Blood-Glucose Meter (ACCU-CHEK AVIVA PLUS METER) Misc 1 Kit by Does not apply route Three times a day    cefdinir (OMNICEF) 300 mg Oral Capsule Take 1 Capsule (300 mg total) by mouth Twice daily for 10 days    clopidogreL (PLAVIX) 75 mg Oral Tablet TAKE 1 TABLET BY MOUTH ONCE DAILY    cyanocobalamin (VITAMIN B12) 1,000 mcg/mL Injection Solution ADMINISTER 1 ML(1000 MCG) UNDER THE SKIN EVERY 30 DAYS    diclofenac sodium (VOLTAREN) 1 % Gel Apply 2 g topically Three times a day as needed    docusate sodium (COLACE) 100 mg Oral Capsule Take 1 Cap (100 mg total) by mouth Twice daily    empagliflozin (JARDIANCE) 10 mg Oral Tablet Take 10 mg by mouth Once a day    ergocalciferol, vitamin D2, (DRISDOL) 1,250 mcg (50,000 unit) Oral Capsule TAKE 1 CAPSULE BY MOUTH EVERY WEEK    furosemide (LASIX) 40 mg Oral Tablet TAKE 1 TABLET(40 MG) BY MOUTH EVERY DAY    Ibuprofen (MOTRIN) 800 mg Oral Tablet TAKE 1 TABLET(800 MG) BY MOUTH THREE TIMES DAILY AS  NEEDED FOR PAIN    levothyroxine (SYNTHROID) 25 mcg Oral Tablet Take 1 Tablet (25 mcg total) by mouth Once a day    lisinopriL (PRINIVIL) 2.5 mg Oral Tablet TAKE 1 TABLET BY MOUTH EVERY DAY    metFORMIN (GLUCOPHAGE) 500 mg Oral Tablet TAKE 1 TABLET BY MOUTH THREE TIMES DAILY WITH MEALS    metoprolol succinate (TOPROL-XL) 25 mg Oral Tablet Sustained Release 24 hr Take 0.5 Tablets (12.5 mg total) by mouth Once a day    NARCAN 4 mg/actuation Nasal Spray, Non-Aerosol PT STATED SHE HAS NARCAN AT HOME BUT IS NOT TAKING IT    niacin (NIASPAN) 500 mg Oral Tablet Sustained Release Take 1 Tab (500 mg total) by mouth Once a day    nitroGLYCERIN (NITROSTAT) 0.4 mg Sublingual Tablet, Sublingual 1 Tab (0.4 mg total) by Sublingual route Every 5 minutes as needed for Chest pain for 3 doses over 15 minutes    omeprazole (PRILOSEC) 40 mg Oral Capsule, Delayed Release(E.C.) TAKE 1 CAPSULE BY MOUTH ONCE DAILY    ondansetron (ZOFRAN ODT) 8 mg Oral Tablet, Rapid Dissolve DISSOLVE 1 TABLET(8 MG) ON THE TONGUE EVERY 8 HOURS AS NEEDED FOR NAUSEA OR VOMITING    PRENATAL PLUS, CALCIUM CARB, 27 mg iron- 1 mg Oral Tablet Take 1 Tab by mouth Once a day    PROVENTIL HFA 90 mcg/actuation Inhalation HFA Aerosol Inhaler Take 1-2 Puffs by inhalation Every 4 hours as needed    pseudoephedrine (SUDAFED) 30 mg Oral Tablet Take 1 Tablet (30 mg total) by mouth Three times daily before meals    rosuvastatin (CRESTOR) 40 mg Oral Tablet Take 1 Tablet (40 mg total) by mouth Once a day TAKE 1 TABLET BY MOUTH ONCE DAILY    semaglutide (OZEMPIC) 0.25 mg or 0.5 mg(2 mg/1.5 mL) Subcutaneous Pen Injector Inject 0.25 mg under the skin Every 7 days    venlafaxine (EFFEXOR XR) 37.5 mg Oral Capsule, Sust. Release 24 hr TAKE 1 CAPSULE BY MOUTH EVERY DAY    ZYRTEC-D 5-120 mg Oral Tablet Sustained Release 12 hr Take 1 Tab by mouth Once a day     Vitals:  03/19/21 1244   BP: 118/78   Pulse: 93   Resp: 16   Temp: 35.6 C (96 F)   SpO2: 98%   Weight: 98 kg  (216 lb)   Height: 1.676 m (5' 6" )   BMI: 34.94       Physical Examination:    GENERAL:   Pt is a pleasant, well-nourished, well-developed 55 y.o. female who is in NAD. Appears stated age  3:  head normocephalic, symmetrical facies. EOM intact b/l. PERRLA. Sclera non-icteric, non-injected. upper eyelid w/Xythoma-lipid plaques, diminished in fullness and intensity-in fact nearly resolved..  Tympanic membranes with scarring superiorly with good light reflex.  Combo clearly visible.  Clear bilateral rhinorrhea. Oropharyngeal mucous membranes are moist  w/o erythema/exudates   NECK:   No masses, no lymphadenopathy, no JVD on exam. Trachea midline, no thyromegaly   CV: S1, S2. No murmurs, rubs, gallops.     LUNGS: CTAB, No rhonchi, rales, wheezes.   GI: (+) BS in all 4 quadrants. soft, NT/ND. No rigidity/positive for guarding/negative for rebound. No organomegaly/masses, or abdominal bruits, complains of epigastric and right upper quadrant pain.  Cannot be provoked on exam.  MSK: Spontaneous normal AROM of major joints as observed during exam. No joint effusions/ swelling/ deformities.   No BLE edema, clubbing, or cyanosis.   2+ radial pulse present b/l.   + 2 reflexes Brachioradialis and patellar bilaterally.   Gait normal.  SKIN: No significant lesions, rashes, ecchymoses noted.  suboccipital skin lesion is not reviewed today.  NEURO: AAOx4, CN grossly intact. Sensation intact b/l. No focal deficits.  PSYCH: Mood, behavior and affect normal w/ Intact judgement & insight  Exam not significantly changed.       03/19/21 1131   Depression Screen   Little interest or pleasure in doing things. 0   Feeling down, depressed, or hopeless 0   PHQ 2 Total 0     Diabetes Monitors    CBC  Diff   Lab Results   Component Value Date/Time    WBC 10.8 01/30/2020 12:13 PM    HGB 14.2 01/30/2020 12:13 PM    HCT 43.9 01/30/2020 12:13 PM    PLTCNT 382 01/30/2020 12:13 PM    RBC 4.84 01/30/2020 12:13 PM    MCV 90.7 01/30/2020 12:13 PM     MCHC 32.3 01/30/2020 12:13 PM    MCH 29.3 01/30/2020 12:13 PM    RDW 13.3 08/30/2018 09:07 AM    MPV 10.2 01/30/2020 12:13 PM    Lab Results   Component Value Date/Time    PMNS 53 08/30/2018 09:07 AM    LYMPHOCYTES 33 08/30/2018 09:07 AM    EOSINOPHIL 3 08/30/2018 09:07 AM    MONOCYTES 11 (H) 08/30/2018 09:07 AM    BASOPHILS 1 08/30/2018 09:07 AM    BASOPHILS 0.10 08/30/2018 09:07 AM    PMNABS 4.60 08/30/2018 09:07 AM    LYMPHSABS 2.90 08/30/2018 09:07 AM    EOSABS 0.20 08/30/2018 09:07 AM    MONOSABS 0.90 (H) 08/30/2018 09:07 AM    BASOSABS 0.034 04/01/2011 05:08 AM          COMPREHENSIVE METABOLIC PANEL - NON FASTING  Lab Results   Component Value Date    SODIUM 140 07/12/2020    POTASSIUM 4.2 07/12/2020    CHLORIDE 106 07/12/2020    CO2 22 07/12/2020    ANIONGAP 12 07/12/2020    BUN 9 07/12/2020    CREATININE 1.03 07/12/2020    GLUCOSENF 219 (H) 07/12/2020  CALCIUM 8.4 (L) 07/12/2020    ALBUMIN 3.4 (L) 01/30/2020    TOTALPROTEIN 6.2 (L) 01/30/2020    ALKPHOS 102 01/30/2020    AST 21 01/30/2020    ALT 15 01/30/2020       A1C: 9.9  A1C Date: 07/04/2020         9.6 today.  Nephropathy Screening: On ACEI or ARB  Lab Results   Component Value Date    CHOLESTEROL 131 07/12/2020    HDLCHOL 43 (L) 07/12/2020    LDLCHOL 61 07/12/2020    TRIG 136 07/12/2020      Retinal Exam Date: 02/01/2019  Last diabetic foot exam: 06/01/2019     Visit Diagnosis    Encounter Diagnoses   Name Primary?    Type 2 diabetes mellitus with complication, without long-term current use of insulin (CMS HCC) Yes    Peripheral vascular disease, unspecified (CMS HCC)     Cerebrovascular accident (CVA), unspecified mechanism (CMS [facility])     Myocardial infarction, unspecified MI type, unspecified artery (CMS HCC)     B12 deficiency     Obesity (BMI 35.0-39.9 without comorbidity)     Nasal turbinate hypertrophy      Orders Placed This Encounter    rosuvastatin (CRESTOR) 40 mg Oral Tablet    cyanocobalamin (VITAMIN B12) 1,000 mcg/mL Injection  Solution    pseudoephedrine (SUDAFED) 30 mg Oral Tablet       Type 2 diabetes mellitus with complication, without long-term current use of insulin (CMS HCC)  Chronic worsening  control  HbA1C greater than 10%.  Follow-up with Dr. Alvino Chapel  Should improve now that she is on stable meds.  She will continue work on diet.  Continue to address weight loss which is stalled.  Continue metformin 500 mg to t.i.d..  Stopped Trulicity for cost and difficulty with compliance.  Continue Ozempic.  Continue Jardiance.  Stopped Glyxambi 10-5 mg.    Encouraged her to check blood sugars 3-4 times daily.  Discuss renal concerns and need for monitoring.   Repeat labs next encounter.    Allergic rhinitis:  Continue histamine blocker.  Will add pseudoephedrine.    Coronary artery disease  Status post CABG  Continue Plavix  Continue beta-blocker  Continue statin.  Continue sublingual nitro p.r.n. for chest pain.  Continue with Cardiology.    Depression, unspecified depression type  Chronic, controlled.  Continue Effexor    Weight loss:  Has lost significant weight since our last encounter.  Continue behavioral efforts.    GERD  Chronic, Uncontrolled problem   Encouraged lifestyle modification to help with acid reflux, including:   Weight loss   Avoid eating for 2-3 hours prior to bedtime. Avoid late night snacking.    Avoid hot/spicy and acidic foods and overeating.    Remain upright after eating, avoid tight clothing.    Elevate head of bed.   Chew gum or lozenges.   Refrain from alcohol and smoking.   Continue histamine blocker.  Continue  proton pump inhibitor.  Has appointment General surgery for upper GI scope.  She will attend to the emergency room if her symptoms warrant prior to that encounter.      Chronic left-sided lower extremity pain  We review her imaging to her satisfaction.  Copies of the results are offered to the patient.  Patient with osteoarthritis  Has comorbid peripheral vascular disease  Will increase her  statin follow.  Patient has follow-ups with orthopedics for the hip and knee.  Continue follow-up  with Dr. Burman Riis in [facility] for low back pain.    We again discussed use of weight loss as a primary method for pain relief.  Continue on muscle relaxer.  Patient avoids nonsteroidals secondary to her bariatric surgery.  Labs have been reassuring.    Carotid Art Stenosis:   50% stenosis to the right ICA  100% stenosis to left ICA.  Continue with Cardiology, Interventional Cardiology  Has had follow-ups with vascular surgery scheduled but has not completed those encounters.    Will follow.    Right upper extremity 5th digit fracture.  Resolved.    Right facial nevus  Would benefit from biopsy.  Previously referred.  Will follow.    OSA:  Continue with Neurology.          Return in about 3 months (around 06/18/2021), or if symptoms worsen or fail to improve, for In Person Visit, chronic disease management, diabetes, hypertension,.    Cherly Beach, MD  03/19/2021, 11:42  This note was partially generated using MModal Fluency Direct system, and there may be some incorrect words, spellings, and punctuation that were not noted in checking the note before saving.    PDMP is reviewed today.  Patient continues to take hydrocodone 10 mg 3 times a day.  It was 03/14/2021.  This is currently prescribed by Kaleen Mask.

## 2021-03-25 ENCOUNTER — Encounter (HOSPITAL_BASED_OUTPATIENT_CLINIC_OR_DEPARTMENT_OTHER): Payer: Self-pay | Admitting: Family

## 2021-03-25 ENCOUNTER — Ambulatory Visit (INDEPENDENT_AMBULATORY_CARE_PROVIDER_SITE_OTHER): Payer: Commercial Managed Care - PPO | Admitting: Family

## 2021-03-25 ENCOUNTER — Other Ambulatory Visit: Payer: Self-pay

## 2021-03-25 VITALS — BP 110/80 | HR 79 | Ht 65.8 in | Wt 213.6 lb

## 2021-03-25 DIAGNOSIS — Z951 Presence of aortocoronary bypass graft: Secondary | ICD-10-CM

## 2021-03-25 DIAGNOSIS — E7800 Pure hypercholesterolemia, unspecified: Secondary | ICD-10-CM

## 2021-03-25 DIAGNOSIS — I2510 Atherosclerotic heart disease of native coronary artery without angina pectoris: Secondary | ICD-10-CM

## 2021-03-25 DIAGNOSIS — F17200 Nicotine dependence, unspecified, uncomplicated: Secondary | ICD-10-CM

## 2021-03-25 DIAGNOSIS — I6523 Occlusion and stenosis of bilateral carotid arteries: Secondary | ICD-10-CM

## 2021-03-25 DIAGNOSIS — I10 Essential (primary) hypertension: Secondary | ICD-10-CM

## 2021-03-25 MED ORDER — NITROGLYCERIN 0.4 MG SUBLINGUAL TABLET
0.4000 mg | SUBLINGUAL_TABLET | SUBLINGUAL | 3 refills | Status: AC | PRN
Start: 2021-03-25 — End: ?

## 2021-03-25 NOTE — Patient Instructions (Signed)
Continue same meds     Monitor blood pressure for goal of 130/80 or less and notify office with persistently elevated blood pressures.     Healthy diet and exercise 20-30 minutes 5 times per week.     Sodium restriction.     Recommend for you to stop smoking.     Continue follow up with endocrinology.

## 2021-03-25 NOTE — Progress Notes (Signed)
CARDIOLOGY, MEDICAL OFFICE BUILDING D  [place]  [place] [address]     Date:   03/25/2021  Name: Monique [name]  Age: 55 y.o.    REASON FOR VISIT:   Other    Monique [name] is a 54 y.o. female with coronary artery disease status post CABG in 2012 followed by MI which required PCI to the LIMA to the LAD, left main and ostial circ. She has had gastric sleeve surgery with weight loss of 100lbs.    -Stress test 11/2019 revealed no evidence of ischemia.   -Echocardiogram 11/2019 showed normal LV function and no significant valvular abnormality.   -Carotid artery ultrasound 11/2019 showed right internal carotid artery with less than 50% stenosed and no flow was detected in the left ICA( previously noted)    She is here today for routine follow up. She denies chest pain, shortness of breath, palpitations, syncope, or near syncope. Her blood pressure is well controlled per home monitoring. She reports that she remains active caring for her mother in law. She does not exercise regularly.       Current Outpatient Medications   Medication Sig    ACCU-CHEK AVIVA PLUS TEST STRP Strip USE 1 STRIP TO TEST BLOOD SUGAR THREE TIMES A DAY    Blood-Glucose Meter (ACCU-CHEK AVIVA PLUS METER) Misc 1 Kit by Does not apply route Three times a day    clopidogreL (PLAVIX) 75 mg Oral Tablet TAKE 1 TABLET BY MOUTH ONCE DAILY    cyanocobalamin (VITAMIN B12) 1,000 mcg/mL Injection Solution ADMINISTER 1 ML(1000 MCG) UNDER THE SKIN EVERY 30 DAYS    diclofenac sodium (VOLTAREN) 1 % Gel Apply 2 g topically Three times a day as needed    docusate sodium (COLACE) 100 mg Oral Capsule Take 1 Cap (100 mg total) by mouth Twice daily    empagliflozin (JARDIANCE) 10 mg Oral Tablet Take 10 mg by mouth Once a day    ergocalciferol, vitamin D2, (DRISDOL) 1,250 mcg (50,000 unit) Oral Capsule TAKE 1 CAPSULE BY MOUTH EVERY WEEK    furosemide (LASIX) 40 mg Oral Tablet TAKE 1 TABLET(40 MG) BY MOUTH EVERY DAY    Ibuprofen (MOTRIN) 800 mg  Oral Tablet TAKE 1 TABLET(800 MG) BY MOUTH THREE TIMES DAILY AS NEEDED FOR PAIN    levothyroxine (SYNTHROID) 25 mcg Oral Tablet Take 1 Tablet (25 mcg total) by mouth Once a day    lisinopriL (PRINIVIL) 2.5 mg Oral Tablet TAKE 1 TABLET BY MOUTH EVERY DAY    metFORMIN (GLUCOPHAGE) 500 mg Oral Tablet TAKE 1 TABLET BY MOUTH THREE TIMES DAILY WITH MEALS    metoprolol succinate (TOPROL-XL) 25 mg Oral Tablet Sustained Release 24 hr Take 0.5 Tablets (12.5 mg total) by mouth Once a day    NARCAN 4 mg/actuation Nasal Spray, Non-Aerosol PT STATED SHE HAS NARCAN AT HOME BUT IS NOT TAKING IT    niacin (NIASPAN) 500 mg Oral Tablet Sustained Release Take 1 Tab (500 mg total) by mouth Once a day    nitroGLYCERIN (NITROSTAT) 0.4 mg Sublingual Tablet, Sublingual Place 1 Tablet (0.4 mg total) under the tongue Every 5 minutes as needed for Chest pain for 3 doses over 15 minutes    omeprazole (PRILOSEC) 40 mg Oral Capsule, Delayed Release(E.C.) TAKE 1 CAPSULE BY MOUTH ONCE DAILY    ondansetron (ZOFRAN ODT) 8 mg Oral Tablet, Rapid Dissolve DISSOLVE 1 TABLET(8 MG) ON THE TONGUE EVERY 8 HOURS AS NEEDED FOR NAUSEA OR VOMITING    PRENATAL PLUS,  CALCIUM CARB, 27 mg iron- 1 mg Oral Tablet Take 1 Tab by mouth Once a day    PROVENTIL HFA 90 mcg/actuation Inhalation HFA Aerosol Inhaler Take 1-2 Puffs by inhalation Every 4 hours as needed    pseudoephedrine (SUDAFED) 30 mg Oral Tablet Take 1 Tablet (30 mg total) by mouth Three times daily before meals (Patient not taking: Reported on 03/25/2021)    rosuvastatin (CRESTOR) 40 mg Oral Tablet Take 1 Tablet (40 mg total) by mouth Once a day TAKE 1 TABLET BY MOUTH ONCE DAILY    semaglutide (OZEMPIC) 0.25 mg or 0.5 mg(2 mg/1.5 mL) Subcutaneous Pen Injector Inject 0.5 mg under the skin Every 7 days    venlafaxine (EFFEXOR XR) 37.5 mg Oral Capsule, Sust. Release 24 hr TAKE 1 CAPSULE BY MOUTH EVERY DAY    ZYRTEC-D 5-120 mg Oral Tablet Sustained Release 12 hr Take 1 Tab by mouth Once a day     Allergies    Allergen Reactions    Capron Dm [Pyrilamine-Dextromethorphan]  Other Adverse Reaction (Add comment)     LIGHT HEADED    Tylenol W Codeine [Acetaminophen-Codeine]  Other Adverse Reaction (Add comment)     pt states that one time she had chest pains with this med, but dr. thought it was because she had not eaten     Past Medical History:   Diagnosis Date    Anxiety     Carotid stenosis     Chronic back pain     Coronary artery disease involving native coronary artery of native heart     DDD (degenerative disc disease), lumbar     "entire spine"    Ear piercing     GERD (gastroesophageal reflux disease)     H/O complete eye exam     2 years, Dr. [name], at [facility]    History of dental examination     dentures upper and lower    HTN     Hypercholesterolemia     Hyperlipidemia     MI (myocardial infarction) (CMS [facility]) 2012    Neck problem     herniated cervical disc    Obesity (BMI 30-39.9)     Solitary nodule of right lobe of thyroid 03/30/2018    Incidental finding on MRI thoracic spine. [place] ordered.     Stroke (CMS [facility])     2002    Tattoo     Left breast, Right shoulder    Thyroid disorder     Thyroid nodule     Uncontrolled type 2 diabetes mellitus, without long-term current use of insulin (CMS [place])     Xanthelasma          Past Surgical History:   Procedure Laterality Date    ENDOMETRIAL ABLATION  1998    HX ANKLE FRACTURE TX  1990s    Right, Doran Durand procedure    HX CATARACT REMOVAL Bilateral 2013    r and l eyes with implants    HX CESAREAN SECTION  06/20/2000    x3, 11/21/86, 11/10/84    HX CHOLECYSTECTOMY  1988    HX CORONARY ARTERY BYPASS GRAFT  03/31/2011    cardiac, 2 vessel, Dr. [name], [place]  2012    x4 stents    HX GASTRIC SLEEVE  02/2017    [place], [place], Dr. [name] HEART CATHETERIZATION  2012    massive heart attack and stents - [facility]    HX THYROID  BIOPSY      HX THYROIDECTOMY  101/01/2018    HX TONSILLECTOMY      as child    HX YAG Left 09/20/2013          Family Medical History:       Problem Relation (Age of Onset)    Atrial fibrillation Sister    Colon Cancer Father    Diabetes Mother, Maternal Aunt    Heart Disease Sister, Maternal Grandfather    Hypertension (High Blood Pressure) Mother    Lung Cancer Maternal Grandfather    MI <72 years of age Mother            Social History     Socioeconomic History    Marital status: Married     Spouse name: Monique [name]    Number of children: 3    Years of education: completed 11th grade   Occupational History    Occupation: disabled   Tobacco Use    Smoking status: Current Every Day Smoker     Packs/day: 0.50     Years: 20.00     Pack years: 10.00     Types: Cigarettes     Start date: 1981    Smokeless tobacco: Never Used   Brewing technologist Use: Never used   Substance and Sexual Activity    Alcohol use: No    Drug use: No    Sexual activity: Yes     Partners: Male   Other Topics Concern    Routine Exercise No    Ability to Walk 2 Flight of Steps without SOB/CP Yes   Social History Narrative    Living situation: lives at home with husband, Monique [name], and daughter.  1 dog named Duke and 1 cat named Lucky.    Nutrition:  Eats 5 small meals d/t gastric sleeve surgery.     Caffeine use: none, decaf coffee    Exercise: stays busy around house. No formal form of exercise    Seatbelt use: sometimes    Fire extinguishers in the home: yes    Smoke alarms in the home: yes    Carbon monoxide detectors in the home: yes    Nancy Fetter exposure: sunglasses        Monique [name] would like for husband, Monique [name],  to speak for her in the event she would become incapacitated.    Full code.        REVIEW OF SYSTEMS:  Constitutional: No fevers, chills, malaise or abnormal weight loss.    Respiratory: See HPI.   Cardiovascular: See HPI.   Musculoskeletal: No myalgias, arthralgias.  Skin: No rashes, suspicious lesions.  Neurological: No headaches, weakness, paresthesias  Hematological:  No bleeding, spontaneous bruising.  Psychiatric/Behavioral: No  confusion, agitation, hallucinations, paranoia, delusions.    All other systems reviewed and are negative except as noted in [place].    PHYSICAL EXAMINATION:     Vitals reviewed. BP 110/80 Comment: RTA  Pulse 79   Ht 1.671 m (5' 5.8")   Wt 96.9 kg (213 lb 9.6 oz)   LMP  (LMP Unknown)   SpO2 98%   BMI 34.69 kg/m       Vitals Filed         03/25/2021  1435 03/25/2021  1436          BP: 110/94 110/80      Pulse: 79 --              Constitutional: She is oriented to  person, place, and time.  She appears well-developed and well-nourished.   Cardiovascular: Normal rate and rhythm without murmurs, rubs or gallops.  No JVD. No peripheral edema is noted.  Pulmonary/Chest: Lungs clear without wheezes, rales or rhonchi.  No chest wall tenderness or deformity.   Neurological: Alert and oriented x 4.   Skin: Skin is warm and dry. No rash or suspicious skin lesions noted.  Psychiatric:  Normal mood and affect. Speech is normal and behavior is normal. Judgment and thought content normal. Cognition and memory are normal.    I have reviewed  the following labs:  COMPLETE BLOOD COUNT   Lab Results   Component Value Date    WBC 10.8 01/30/2020    HGB 14.2 01/30/2020    HCT 43.9 01/30/2020    PLTCNT 382 01/30/2020       DIFFERENTIAL  Lab Results   Component Value Date    PMNS 53 08/30/2018    LYMPHOCYTES 33 08/30/2018    MONOCYTES 11 (H) 08/30/2018    EOSINOPHIL 3 08/30/2018    BASOPHILS 1 08/30/2018    BASOPHILS 0.10 08/30/2018    NRBCS 0 04/01/2011    PMNABS 4.60 08/30/2018    LYMPHSABS 2.90 08/30/2018    EOSABS 0.20 08/30/2018    MONOSABS 0.90 (H) 08/30/2018    BASOSABS 0.034 04/01/2011        COMPREHENSIVE METABOLIC PANEL - NON FASTING  Lab Results   Component Value Date    SODIUM 140 07/12/2020    POTASSIUM 4.2 07/12/2020    CHLORIDE 106 07/12/2020    CO2 22 07/12/2020    ANIONGAP 12 07/12/2020    BUN 9 07/12/2020    CREATININE 1.03 07/12/2020    GLUCOSENF 219 (H) 07/12/2020    CALCIUM 8.4 (L) 07/12/2020    ALBUMIN 3.4 (L)  01/30/2020    TOTALPROTEIN 6.2 (L) 01/30/2020    ALKPHOS 102 01/30/2020    AST 21 01/30/2020    ALT 15 01/30/2020       BASIC METABOLIC PANEL  Lab Results   Component Value Date    SODIUM 140 07/12/2020    POTASSIUM 4.2 07/12/2020    CHLORIDE 106 07/12/2020    CO2 22 07/12/2020    ANIONGAP 12 07/12/2020    BUN 9 07/12/2020    CREATININE 1.03 07/12/2020    BUNCRRATIO 9 07/12/2020    GFR 61 07/12/2020    CALCIUM 8.4 (L) 07/12/2020    GLUCOSENF 219 (H) 07/12/2020        Lab Results   Component Value Date    CHOLESTEROL 131 07/12/2020    HDLCHOL 43 (L) 07/12/2020    LDLCHOL 61 07/12/2020    TRIG 136 07/12/2020      MAGNESIUM  Lab Results   Component Value Date    MAGNESIUM 1.6 (L) 01/30/2020       Lab A1C Results:  HEMOGLOBIN A1C   Date Value Ref Range Status   09/29/2018 10.4 (H) <5.7 % Final     Comment:     Hemoglobin A1c Ranges  Non-Diabetic:  <5.7  Increased Risk (pre-diabetic) for impaired glucose tolerance:  5.7 - 6.4  Consistent with diabetes (in not previously established diabetic patient:  > or = 6.5     03/29/2018 9.6 (H) <5.7 % Final     Comment:     Hemoglobin A1c Ranges  Non-Diabetic:  <5.7  Increased Risk (pre-diabetic) for impaired glucose tolerance:  5.7 - 6.4  Consistent with diabetes (in not previously established  diabetic patient:  > or = 6.5         POCT A1C Results:  POCT A1c 03/01/2019 06/01/2019 09/12/2019 05/17/2020 07/04/2020   Time Performed 51600 52140 [id number] - [id number]   A1C 9.4 9.0 9.7 9.6 9.9       Lab Results   Component Value Date    POCTHA1C 9.9 (A) 07/04/2020    POCTHA1C 9.7 (A) 09/12/2019    POCTHA1C 9.0 (A) 06/01/2019    POCTHA1C 9.4 (A) 03/01/2019    POCTHA1C 10.7 (A) 10/05/2018    POCTHA1C 10.7 (A) 10/05/2018       ASSESSMENT:  ENCOUNTER DIAGNOSES     ICD-10-CM   1. Coronary artery disease  I25.10   2. Hx of CABG  Z95.1   3. Hypertension, unspecified type  I10   4. Hypercholesterolemia  E78.00   5. Bilateral carotid artery stenosis  I65.23   6. Smoking  F17.200       PLAN:    Coronary  artery status post CABG- She denies anginal symptoms on today's exam. Stress test 11/2019 revealed no evidence of ischemia. Echocardiogram at that time showed normal LV function with no significant valvular abnormalities. She reports that she has been active around her home without limitation.She will continue Plavix, beta-blocker, and statin. She does have sublingual nitroglycerin for use as needed.   History of carotid artery stenosis-She is following with vascular. Carotid artery ultrasound 10/22/20 showed less than 50% stenosis in the right internal carotid artery and occluded left carotid artery.   Hypertension-controlled. Continue current medical therapy.   Hyperlipidemia-she is tolerating statins. Most recent LDL 61.   Diabetes- Followed by endocrinology. Most recent hemoglobin A1c was around 10 per her report.  Tight glycemic control encouraged in order to decrease microvascular risk.   Nicotine use-she admits that she smokes less than 1/2 pack per day. Three to 5 minutes on smoking cessation and treatment modalities discussed with her on today's exam.    FORMER PATIENT OF DR Liam Graham. WAS OK'D TO TRANSFER CARE TO DR MILLER.    Orders Placed This Encounter    nitroGLYCERIN (NITROSTAT) 0.4 mg Sublingual Tablet, Sublingual       Return in about 6 months (around 09/24/2021) for Dr. Sabra Heck .    Electronically signed by     Marcelino Scot, APRN,NP-C  03/25/2021, 12:30    I have reviewed the above history  and agree with the assessment and plan outlined in the nurse practitioner's note.  This is a 55 year old female who was previously followed by Dr. Liam Graham.  Her history is remarkable for coronary artery and peripheral vascular disease.  The patient has undergone previous 2 vessel bypass surgery at [facility]  in 2012 with reportedly subsequent need for percutaneous intervention of the LIMA to LAD, left main and ostial circumflex in 2012. 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  Unfortunately the patient has poorly controlled diabetes mellitus and continues to smoke.  Lipids and blood pressure adequately controlled on her current regimen.  Stress testing in 2020 was negative for ischemia and the patient reports no active anginal symptoms with good functional capacity.  Continue medical therapy with Plavix, beta-blocker and statin    Paulita Cradle, MD      This note may have been partially generated using MModal Fluency Direct system, and there may be some incorrect words, spellings, and punctuation that were not noted in checking the note before saving,  though effort was made to avoid such errors.

## 2021-04-01 ENCOUNTER — Encounter (INDEPENDENT_AMBULATORY_CARE_PROVIDER_SITE_OTHER): Payer: Self-pay | Admitting: Family Medicine

## 2021-04-04 ENCOUNTER — Encounter (INDEPENDENT_AMBULATORY_CARE_PROVIDER_SITE_OTHER): Payer: Self-pay | Admitting: Family Medicine

## 2021-04-05 ENCOUNTER — Other Ambulatory Visit (INDEPENDENT_AMBULATORY_CARE_PROVIDER_SITE_OTHER): Payer: Self-pay | Admitting: Family Medicine

## 2021-04-05 ENCOUNTER — Telehealth (INDEPENDENT_AMBULATORY_CARE_PROVIDER_SITE_OTHER): Payer: Self-pay | Admitting: Family Medicine

## 2021-04-05 DIAGNOSIS — E538 Deficiency of other specified B group vitamins: Secondary | ICD-10-CM

## 2021-04-05 MED ORDER — CYANOCOBALAMIN (VIT B-12) 1,000 MCG/ML ORAL DROPS
1.0000 mL | ORAL | 2 refills | Status: DC
Start: 2021-04-05 — End: 2021-10-22

## 2021-04-05 NOTE — Telephone Encounter (Signed)
[facility] pharmacy called in regarding pts prescription for Vit B12 oral drops. Pharmacy is needing clarification on the order due to previously being prescribed the injections. [facility] can be reached back at [phone].

## 2021-04-09 ENCOUNTER — Ambulatory Visit (HOSPITAL_BASED_OUTPATIENT_CLINIC_OR_DEPARTMENT_OTHER): Payer: Commercial Managed Care - PPO

## 2021-04-09 NOTE — Telephone Encounter (Signed)
Rx was changed from injections to oral drops because insurance wouldn't cover injections. I called the pharmacy and [facility] advising them of this. Asked them to call back with any further questions. Akera Snowberger D. York Cerise, RN  04/09/2021, 08:20

## 2021-04-24 ENCOUNTER — Other Ambulatory Visit: Payer: Self-pay

## 2021-04-25 ENCOUNTER — Encounter (INDEPENDENT_AMBULATORY_CARE_PROVIDER_SITE_OTHER): Payer: Self-pay | Admitting: Family Medicine

## 2021-04-25 ENCOUNTER — Ambulatory Visit (INDEPENDENT_AMBULATORY_CARE_PROVIDER_SITE_OTHER): Payer: Commercial Managed Care - PPO | Attending: Family Medicine | Admitting: Family Medicine

## 2021-04-25 VITALS — BP 110/70 | HR 90 | Temp 96.0°F | Resp 14 | Ht 66.0 in | Wt 216.0 lb

## 2021-04-25 DIAGNOSIS — E669 Obesity, unspecified: Secondary | ICD-10-CM

## 2021-04-25 DIAGNOSIS — E118 Type 2 diabetes mellitus with unspecified complications: Secondary | ICD-10-CM

## 2021-04-25 NOTE — Progress Notes (Signed)
FAMILY MEDICINE, [facility]  [place] [address]       Name: Monique [name] MRN:  [id number]   Date: 04/25/2021 Age: 55 y.o.     Chief complaint: The patient is a 55 y.o. old female who came in today for Diabetes.    HPI:    Diabetes: Last HbA1C was 8%.  Patient denies symptoms of polydipsia, polyuria, hypoglycemic unawareness, chest pain, dyspnea on exertion, diarrhea, foot ulcerations, paresthesias. Symptoms are not changed. Patient's home blood sugars are improving, often less than 200. She reports her fasting sugar this morning was 160. Patient is compliant with medications.  She continues to use metformin, Jardiance, and Ozempic. She has been seeing a diabetic specialist.     ROS:  Review of systems completed with all positives and pertinent negatives as per HPI.  Otherwise, negative.    Past medical history:  Past Medical History:   Diagnosis Date   . Anxiety    . Carotid stenosis    . Chronic back pain    . Coronary artery disease involving native coronary artery of native heart    . DDD (degenerative disc disease), lumbar     "entire spine"   . Ear piercing    . GERD (gastroesophageal reflux disease)    . H/O complete eye exam     2 years, Dr. [name], at [facility]   . History of dental examination     dentures upper and lower   . HTN    . Hypercholesterolemia    . Hyperlipidemia    . MI (myocardial infarction) (CMS [facility]) 2012   . Neck problem     herniated cervical disc   . Obesity (BMI 30-39.9)    . 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 [place] ordered.    . Stroke (CMS [facility])     2002   . Tattoo     Left breast, Right shoulder   . Thyroid disorder    . Thyroid nodule    . Uncontrolled type 2 diabetes mellitus, without long-term current use of insulin (CMS [place])    . Xanthelasma      Past surgical history:  Past Surgical History:   Procedure Laterality Date   . ENDOMETRIAL ABLATION  1998   . HX ANKLE FRACTURE TX  1990s     Right, Doran Durand procedure   . HX CATARACT REMOVAL Bilateral 2013    r and l eyes with implants   . HX CESAREAN SECTION  06/20/2000    x3, 11/21/86, 11/10/84   . HX CHOLECYSTECTOMY  1988   . HX CORONARY ARTERY BYPASS GRAFT  03/31/2011    cardiac, 2 vessel, Dr. [name], [facility]   . HX CORONARY STENT PLACEMENT  2012    x4 stents   . HX GASTRIC SLEEVE  02/2017    [place], [place], Dr. Pearlie Oyster   . HX HEART CATHETERIZATION  2012    massive heart attack and stents - [facility]   . HX THYROID BIOPSY     . HX THYROIDECTOMY  101/01/2018   . HX TONSILLECTOMY      as child   . HX YAG Left 09/20/2013     Medications:  Current Outpatient Medications   Medication Sig   . ACCU-CHEK AVIVA PLUS TEST STRP Strip USE 1 STRIP TO TEST BLOOD SUGAR THREE TIMES A DAY   . Blood-Glucose Meter (ACCU-CHEK AVIVA PLUS  METER) Misc 1 Kit by Does not apply route Three times a day   . clopidogreL (PLAVIX) 75 mg Oral Tablet TAKE 1 TABLET BY MOUTH ONCE DAILY   . cyanocobalamin, vitamin B-12, 1,000 mcg/mL Oral Drops Take 1 mL (1,000 mcg total) by mouth Every 7 days   . diclofenac sodium (VOLTAREN) 1 % Gel Apply 2 g topically Three times a day as needed   . docusate sodium (COLACE) 100 mg Oral Capsule Take 1 Cap (100 mg total) by mouth Twice daily   . empagliflozin (JARDIANCE) 10 mg Oral Tablet Take 10 mg by mouth Once a day   . ergocalciferol, vitamin D2, (DRISDOL) 1,250 mcg (50,000 unit) Oral Capsule TAKE 1 CAPSULE BY MOUTH EVERY WEEK   . furosemide (LASIX) 40 mg Oral Tablet TAKE 1 TABLET(40 MG) BY MOUTH EVERY DAY   . Ibuprofen (MOTRIN) 800 mg Oral Tablet TAKE 1 TABLET(800 MG) BY MOUTH THREE TIMES DAILY AS NEEDED FOR PAIN   . levothyroxine (SYNTHROID) 25 mcg Oral Tablet Take 1 Tablet (25 mcg total) by mouth Once a day   . lisinopriL (PRINIVIL) 2.5 mg Oral Tablet TAKE 1 TABLET BY MOUTH EVERY DAY   . metFORMIN (GLUCOPHAGE) 500 mg Oral Tablet TAKE 1 TABLET BY MOUTH THREE TIMES DAILY WITH MEALS   . metoprolol succinate (TOPROL-XL) 25 mg Oral Tablet  Sustained Release 24 hr Take 0.5 Tablets (12.5 mg total) by mouth Once a day   . NARCAN 4 mg/actuation Nasal Spray, Non-Aerosol PT STATED SHE HAS NARCAN AT HOME BUT IS NOT TAKING IT   . niacin (NIASPAN) 500 mg Oral Tablet Sustained Release Take 1 Tab (500 mg total) by mouth Once a day   . nitroGLYCERIN (NITROSTAT) 0.4 mg Sublingual Tablet, Sublingual Place 1 Tablet (0.4 mg total) under the tongue Every 5 minutes as needed for Chest pain for 3 doses over 15 minutes   . omeprazole (PRILOSEC) 40 mg Oral Capsule, Delayed Release(E.C.) TAKE 1 CAPSULE BY MOUTH ONCE DAILY   . ondansetron (ZOFRAN ODT) 8 mg Oral Tablet, Rapid Dissolve DISSOLVE 1 TABLET(8 MG) ON THE TONGUE EVERY 8 HOURS AS NEEDED FOR NAUSEA OR VOMITING   . PRENATAL PLUS, CALCIUM CARB, 27 mg iron- 1 mg Oral Tablet Take 1 Tab by mouth Once a day   . PROVENTIL HFA 90 mcg/actuation Inhalation HFA Aerosol Inhaler Take 1-2 Puffs by inhalation Every 4 hours as needed   . pseudoephedrine (SUDAFED) 30 mg Oral Tablet Take 1 Tablet (30 mg total) by mouth Three times daily before meals   . rosuvastatin (CRESTOR) 40 mg Oral Tablet Take 1 Tablet (40 mg total) by mouth Once a day TAKE 1 TABLET BY MOUTH ONCE DAILY   . semaglutide (OZEMPIC) 0.25 mg or 0.5 mg(2 mg/1.5 mL) Subcutaneous Pen Injector Inject 0.5 mg under the skin Every 7 days   . venlafaxine (EFFEXOR XR) 37.5 mg Oral Capsule, Sust. Release 24 hr TAKE 1 CAPSULE BY MOUTH EVERY DAY   . ZYRTEC-D 5-120 mg Oral Tablet Sustained Release 12 hr Take 1 Tab by mouth Once a day     Vitals:    04/25/21 1003   BP: 110/70   Pulse: 90   Resp: 14   Temp: 35.6 C (96 F)   SpO2: 98%   Weight: 98 kg (216 lb)   Height: 1.676 m (5' 6" )   BMI: 34.94         Physical Examination:    GENERAL:   Pt is a pleasant, well-nourished, well-developed 55 y.o.  female who is in NAD. Appears stated age  89:  head normocephalic, symmetrical facies. EOM intact b/l. PERRLA. Sclera non-icteric, non-injected. upper eyelid w/Xythoma-lipid plaques,  diminished in fullness and intensity. Oropharyngeal mucous membranes are moist  w/o erythema/exudates   NECK:   No masses, no lymphadenopathy, no JVD on exam. Trachea midline, no thyromegaly   CV: S1, S2. No murmurs, rubs, gallops.     LUNGS: CTAB, No rhonchi, rales, wheezes.   GI: (+) BS in all 4 quadrants. soft, NT/ND. No rigidity/positive for guarding/negative for rebound. No organomegaly/masses, or abdominal bruits,   MSK: Spontaneous normal AROM of major joints as observed during exam. No joint effusions/ swelling/ deformities.   No BLE edema, clubbing, or cyanosis.   2+ radial pulse present b/l.   + 2 reflexes Brachioradialis and patellar bilaterally.   Gait normal.  SKIN: No significant lesions, rashes, ecchymoses noted.  She has benign lesions on her forehead and the right lateral canthus. She would like these excised.  NEURO: AAOx4, CN grossly intact. Sensation intact b/l. No focal deficits.  PSYCH: Mood, behavior and affect normal w/ Intact judgement & insight  Exam not significantly changed.       04/25/21 1004   PHQ 9 (follow up)   Little interest or pleasure in doing things. 0   Feeling down, depressed, or hopeless 0   Trouble falling or staying asleep, or sleeping too much. 0   Feeling tired or having little energy 2   Poor appetite or overeating 0   Feeling bad about yourself/ that you are a failure in the past 2 weeks? 0   Trouble concentrating on things in the past 2 weeks? 0   Moving/Speaking slowly or being fidgety or restless  in the past 2 weeks? 0   Thoughts that you would be better off DEAD, or of hurting yourself in some way. 0   If you checked off any problems, how difficult have these problems made it for you to do your work, take care of things at home, or get along with other people? Not difficult at all   PHQ 9 Total 2       Diabetes Monitors    CBC  Diff   Lab Results   Component Value Date/Time    WBC 10.8 01/30/2020 12:13 PM    HGB 14.2 01/30/2020 12:13 PM    HCT 43.9 01/30/2020 12:13  PM    PLTCNT 382 01/30/2020 12:13 PM    RBC 4.84 01/30/2020 12:13 PM    MCV 90.7 01/30/2020 12:13 PM    MCHC 32.3 01/30/2020 12:13 PM    MCH 29.3 01/30/2020 12:13 PM    RDW 13.3 08/30/2018 09:07 AM    MPV 10.2 01/30/2020 12:13 PM    Lab Results   Component Value Date/Time    PMNS 53 08/30/2018 09:07 AM    LYMPHOCYTES 33 08/30/2018 09:07 AM    EOSINOPHIL 3 08/30/2018 09:07 AM    MONOCYTES 11 (H) 08/30/2018 09:07 AM    BASOPHILS 1 08/30/2018 09:07 AM    BASOPHILS 0.10 08/30/2018 09:07 AM    PMNABS 4.60 08/30/2018 09:07 AM    LYMPHSABS 2.90 08/30/2018 09:07 AM    EOSABS 0.20 08/30/2018 09:07 AM    MONOSABS 0.90 (H) 08/30/2018 09:07 AM    BASOSABS 0.034 04/01/2011 05:08 AM          COMPREHENSIVE METABOLIC PANEL - NON FASTING  Lab Results   Component Value Date    SODIUM 140 07/12/2020  POTASSIUM 4.2 07/12/2020    CHLORIDE 106 07/12/2020    CO2 22 07/12/2020    ANIONGAP 12 07/12/2020    BUN 9 07/12/2020    CREATININE 1.03 07/12/2020    GLUCOSENF 219 (H) 07/12/2020    CALCIUM 8.4 (L) 07/12/2020    ALBUMIN 3.4 (L) 01/30/2020    TOTALPROTEIN 6.2 (L) 01/30/2020    ALKPHOS 102 01/30/2020    AST 21 01/30/2020    ALT 15 01/30/2020       Visit Diagnosis    Encounter Diagnoses   Name Primary?   . Type 2 diabetes mellitus with complication, without long-term current use of insulin (CMS [facility]) Yes   . Obesity (BMI 30.0-34.9)      No orders of the defined types were placed in this encounter.      Type 2 diabetes mellitus with complication, without long-term current use of insulin (CMS [facility])  Chronic worsening  control  HbA1C 8%.  Follow-up with Dr. Alvino Chapel  continue diabetic diet.  Continue to address weight loss which is stalled.  Continue metformin 500 mg two pills Bi.d..  Continue Ozempic.  Continue Jardiance.  Stopped Glyxambi 10-5 mg.    Encouraged her to check blood sugars 3-4 times daily.  Discuss renal concerns and need for monitoring.   Repeat labs next encounter.    Allergic rhinitis:  Continue histamine blocker.  Will  add pseudoephedrine.    Coronary artery disease  Status post CABG  Continue Plavix  Continue beta-blocker  Continue statin.  Continue sublingual nitro p.r.n. for chest pain.  Continue with Cardiology.    Depression, unspecified depression type  Chronic, controlled.  See PHQ above.   Continue Effexor    Weight loss:  Has lost significant weight since our last encounter.  Continue behavioral efforts.    GERD  Chronic, Uncontrolled problem   Encouraged lifestyle modification to help with acid reflux, including:   Weight loss   Avoid eating for 2-3 hours prior to bedtime. Avoid late night snacking.    Avoid hot/spicy and acidic foods and overeating.    Remain upright after eating, avoid tight clothing.    Elevate head of bed.   Chew gum or lozenges.   Refrain from alcohol and smoking.   Continue histamine blocker.  Continue  proton pump inhibitor.  Has appointment General surgery for upper GI scope.  She will attend to the emergency room if her symptoms warrant prior to that encounter.      Chronic left-sided lower extremity pain  We review her imaging to her satisfaction.  Copies of the results are offered to the patient.  Patient with osteoarthritis  Has comorbid peripheral vascular disease  Will increase her statin follow.  Patient has follow-ups with orthopedics for the hip and knee.  Continue follow-up with Dr. Burman Riis in [facility]. for low back pain.    We again discussed use of weight loss as a primary method for pain relief.  Continue on muscle relaxer.  Patient avoids nonsteroidals secondary to her bariatric surgery.  Labs have been reassuring.    Carotid Art Stenosis:   50% stenosis to the right ICA  100% stenosis to left ICA.  Continue with Cardiology, Interventional Cardiology  Has had follow-ups with vascular surgery scheduled but has not completed those encounters.    Will follow.    Right facial nevus  Would benefit from biopsy.  Was seen by local derm and   recommend watchful waiting.   She  seeks second opinion.  Will look into derm in [place].   Will follow.    OSA:  Could not keep appt for sleep study,   Continue with Neurology.    Has f/u 05/20/21      Return in about 6 months (around 10/26/2021), or if symptoms worsen or fail to improve, for In Person Visit, chronic dz coordination. Cherly Beach, MD  04/25/2021, 10:31  This note was partially generated using MModal Fluency Direct system, and there may be some incorrect words, spellings, and punctuation that were not noted in checking the note before saving.    PDMP is reviewed today.  Patient continues to take hydrocodone 10 mg 3 times a day.  It was 04/11/21.  This is currently prescribed by Bonita Quin.

## 2021-04-25 NOTE — Nursing Note (Signed)
04/25/21 1004   PHQ 9 (follow up)   Little interest or pleasure in doing things. 0   Feeling down, depressed, or hopeless 0   Trouble falling or staying asleep, or sleeping too much. 0   Feeling tired or having little energy 2   Poor appetite or overeating 0   Feeling bad about yourself/ that you are a failure in the past 2 weeks? 0   Trouble concentrating on things in the past 2 weeks? 0   Moving/Speaking slowly or being fidgety or restless  in the past 2 weeks? 0   Thoughts that you would be better off DEAD, or of hurting yourself in some way. 0   If you checked off any problems, how difficult have these problems made it for you to do your work, take care of things at home, or get along with other people? Not difficult at all   PHQ 9 Total 2

## 2021-05-01 ENCOUNTER — Other Ambulatory Visit: Payer: Self-pay

## 2021-05-02 ENCOUNTER — Other Ambulatory Visit (INDEPENDENT_AMBULATORY_CARE_PROVIDER_SITE_OTHER): Payer: Self-pay | Admitting: Family Medicine

## 2021-05-02 DIAGNOSIS — E118 Type 2 diabetes mellitus with unspecified complications: Secondary | ICD-10-CM

## 2021-05-02 DIAGNOSIS — I219 Acute myocardial infarction, unspecified: Secondary | ICD-10-CM

## 2021-05-02 MED ORDER — METFORMIN 500 MG TABLET
ORAL_TABLET | ORAL | 3 refills | Status: DC
Start: 2021-05-02 — End: 2021-10-22

## 2021-05-02 NOTE — Telephone Encounter (Signed)
Pt requesting refill:  Metformin 500mg  2 twice a day   Pharmacy: [facility]  Pt can be reached at [phone].

## 2021-05-08 ENCOUNTER — Ambulatory Visit (INDEPENDENT_AMBULATORY_CARE_PROVIDER_SITE_OTHER): Payer: Commercial Managed Care - PPO | Admitting: Family

## 2021-05-08 ENCOUNTER — Other Ambulatory Visit: Payer: Self-pay

## 2021-05-08 ENCOUNTER — Encounter (INDEPENDENT_AMBULATORY_CARE_PROVIDER_SITE_OTHER): Payer: Self-pay

## 2021-05-08 ENCOUNTER — Encounter (INDEPENDENT_AMBULATORY_CARE_PROVIDER_SITE_OTHER): Payer: Self-pay | Admitting: Family

## 2021-05-08 ENCOUNTER — Other Ambulatory Visit (INDEPENDENT_AMBULATORY_CARE_PROVIDER_SITE_OTHER): Payer: Commercial Managed Care - PPO

## 2021-05-08 VITALS — BP 110/68 | HR 84 | Temp 96.9°F | Resp 18 | Ht 66.0 in | Wt 212.0 lb

## 2021-05-08 DIAGNOSIS — I2510 Atherosclerotic heart disease of native coronary artery without angina pectoris: Secondary | ICD-10-CM

## 2021-05-08 DIAGNOSIS — R7989 Other specified abnormal findings of blood chemistry: Secondary | ICD-10-CM

## 2021-05-08 DIAGNOSIS — I152 Hypertension secondary to endocrine disorders: Secondary | ICD-10-CM

## 2021-05-08 DIAGNOSIS — E1159 Type 2 diabetes mellitus with other circulatory complications: Secondary | ICD-10-CM

## 2021-05-08 DIAGNOSIS — E1151 Type 2 diabetes mellitus with diabetic peripheral angiopathy without gangrene: Secondary | ICD-10-CM

## 2021-05-08 DIAGNOSIS — Z6834 Body mass index (BMI) 34.0-34.9, adult: Secondary | ICD-10-CM

## 2021-05-08 DIAGNOSIS — R42 Dizziness and giddiness: Secondary | ICD-10-CM

## 2021-05-08 DIAGNOSIS — E669 Obesity, unspecified: Secondary | ICD-10-CM

## 2021-05-08 DIAGNOSIS — N179 Acute kidney failure, unspecified: Secondary | ICD-10-CM

## 2021-05-08 DIAGNOSIS — Z8673 Personal history of transient ischemic attack (TIA), and cerebral infarction without residual deficits: Secondary | ICD-10-CM

## 2021-05-08 DIAGNOSIS — E118 Type 2 diabetes mellitus with unspecified complications: Secondary | ICD-10-CM

## 2021-05-08 DIAGNOSIS — I739 Peripheral vascular disease, unspecified: Secondary | ICD-10-CM

## 2021-05-08 LAB — CBC WITH DIFF
BASOPHIL #: 0.05 10*3/uL (ref 0.00–0.10)
BASOPHIL %: 1 % (ref 0–1)
EOSINOPHIL #: 0.16 10*3/uL (ref 0.00–0.40)
EOSINOPHIL %: 2 % (ref 0–6)
HCT: 42.1 % (ref 32.8–48.8)
HGB: 13.7 g/dL (ref 10.0–15.6)
LYMPHOCYTE #: 3.62 10*3/uL (ref 1.00–3.70)
LYMPHOCYTE %: 40 % (ref 14–45)
MCH: 29.6 pg (ref 25.9–32.7)
MCHC: 32.5 g/dL (ref 31.0–37.0)
MCV: 90.9 fL (ref 82.3–99.0)
MONOCYTE #: 1.07 10*3/uL — ABNORMAL HIGH (ref 0.00–0.70)
MONOCYTE %: 12 % (ref 0–14)
NEUTROPHIL #: 4.25 10*3/uL (ref 1.50–7.00)
NEUTROPHIL %: 47 % (ref 43–80)
PLATELETS: 431 10*3/uL — ABNORMAL HIGH (ref 129–381)
RBC: 4.63 10*6/uL (ref 3.54–5.34)
RDW: 14.2 % (ref 10.8–15.2)
WBC: 9.2 10*3/uL (ref 3.1–11.1)

## 2021-05-08 LAB — LIPID PANEL
CHOLESTEROL: 182 mg/dL (ref 0–199)
HDL CHOL: 55 mg/dL (ref 40–60)
LDL CALC: 96 mg/dL (ref 0–130)
TRIGLYCERIDES: 153 mg/dL — ABNORMAL HIGH (ref 0–149)
VLDL CALC: 31 mg/dL

## 2021-05-08 LAB — COMPREHENSIVE METABOLIC PNL, FASTING
ALBUMIN: 4.2 g/dL (ref 3.5–5.0)
ALKALINE PHOSPHATASE: 92 U/L (ref 38–126)
ALT (SGPT): 14 U/L (ref <=35)
ANION GAP: 6 mmol/L
AST (SGOT): 21 U/L (ref 14–36)
BILIRUBIN TOTAL: 0.4 mg/dL (ref 0.2–5.0)
BUN/CREA RATIO: 19
BUN: 17 mg/dL (ref 7–17)
CALCIUM: 9.9 mg/dL (ref 8.4–10.2)
CHLORIDE: 107 mmol/L (ref 98–107)
CO2 TOTAL: 26 mmol/L (ref 22–30)
CREATININE: 0.9 mg/dL (ref 0.52–1.04)
ESTIMATED GFR: 72 mL/min/{1.73_m2} (ref >=60)
GLUCOSE: 177 mg/dL — ABNORMAL HIGH (ref 74–106)
POTASSIUM: 4.7 mmol/L (ref 3.5–5.1)
PROTEIN TOTAL: 6.8 g/dL (ref 6.3–8.2)
SODIUM: 139 mmol/L (ref 137–145)

## 2021-05-08 LAB — MICROALBUMIN/CREATININE RATIO, URINE, RANDOM
CREATININE RANDOM URINE: 157 mg/dL (ref 100–200)
MICROALBUMIN RANDOM URINE: 1.2 mg/dL (ref 0.0–1.7)
MICROALBUMIN/CREATININE RATIO RANDOM URINE: 7.6 mg/g (ref 0.0–30.0)

## 2021-05-08 NOTE — Progress Notes (Signed)
PRIMARY CARE, MID- [facility]  [place] [address]    Progress Note    Name: Monique [name] MRN:  [id number]   Date: 05/08/2021 Age: 55 y.o.         Reason for Visit: ED Follow-up (Patient was at the [facility] ED after having 2 falls in the same day. Patient reports that this has not happened since. Found to have AKI. 50% reduction in kidney function over the last 6 months. It was recommended that the patient be admitted. Patient left AMA. )    History of Present Illness  Monique [name] is a 55 y.o. female who is being seen today for ER follow up.      Patient presented to [facility] ED on 05/03/2021 for complaints of weakness and dizziness.  She reported having a bad knee that gives way at times.  Patient reported falling twice.  She reported worsening dizziness and weakness that usual.  Her systolic BP was 92 in the ED--she reported this was her usual. She had labs and imaging studies.  CT of Brain results: old infarct right frontal area and in both parietal areas, no new findings.  Left knee xray showed mild osteoarthritic changes, peripheral atherosclerotic vascular disease.  CXR was negative for acute findings.  Creatinine elevated at 1.76 and BUN elevated at 21.8. Patient was given IV fluids and admission for AKI/acute renal failure recommended.  She left AMA.  Baseline creatinine in the past here was 1.03.        Patient presents for follow up.  States she stopped taking lisinopril and Metoprolol 2 days before ER visit and while in the ED she had no dizziness after stopping the meds.  States the only reason she went in was because her left knee had gave out on her a couple times.  Reports she restarted metoprolol and lisinopril  yesterday and has not had recurrence of dizziness.  Has had no further dizzy spells.  Deneis knee giving out on her since before the ER.  Denies any knee pain before or after the ED visit.   Denies left knee pain.  Denies chest pain, palpitations, weakness,   syncope, headaches, dizziness, nausea, vomiting, diarrhea, dysuria, urinary frequency .  She has RX for ibuprofen 86m that she takes as needed--admits to taking ibuprofen 8052m3-4 times the week preceding the ED visit--last dose the day before ER visit  Has not taken it since. States she took it for headache that has since then resolved.  She has diabetes.  Morning sugars run 150-180. Denies hypoglycemia.  She started to follow with Dr. PiAlvino Chapel mos ago and was started on Ozempic and Jardiance at that time. Reports morning sugars were around 230 before these meds started.  She denies hypoglycemia.  When questioned reports she always drinks plenty of water throughout the day.       HEMOGLOBIN A1C   Date Value Ref Range Status   09/29/2018 10.4 (H) <5.7 % Final     Comment:     Hemoglobin A1c Ranges  Non-Diabetic:  <5.7  Increased Risk (pre-diabetic) for impaired glucose tolerance:  5.7 - 6.4  Consistent with diabetes (in not previously established diabetic patient:  > or = 6.5     03/29/2018 9.6 (H) <5.7 % Final     Comment:     Hemoglobin A1c Ranges  Non-Diabetic:  <5.7  Increased Risk (pre-diabetic) for impaired glucose tolerance:  5.7 - 6.4  Consistent  with diabetes (in not previously established diabetic patient:  > or = 6.5           Past Medical History:   Diagnosis Date   . Anxiety    . Carotid stenosis    . Chronic back pain    . Coronary artery disease involving native coronary artery of native heart    . DDD (degenerative disc disease), lumbar     "entire spine"   . Ear piercing    . GERD (gastroesophageal reflux disease)    . H/O complete eye exam     2 years, Dr. [name], at [facility]   . History of dental examination     dentures upper and lower   . HTN    . Hypercholesterolemia    . Hyperlipidemia    . MI (myocardial infarction) (CMS [place]) 2012   . Neck problem     herniated cervical disc   . Obesity (BMI 30-39.9)    . Solitary nodule of right lobe of thyroid 03/30/2018    Incidental finding on MRI  thoracic spine. [place] ordered.    . Stroke (CMS [facility])     2002   . Tattoo     Left breast, Right shoulder   . Thyroid disorder    . Thyroid nodule    . Uncontrolled type 2 diabetes mellitus, without long-term current use of insulin (CMS [place])    . Xanthelasma          Past Surgical History:   Procedure Laterality Date   . ENDOMETRIAL ABLATION  1998   . HX ANKLE FRACTURE TX  1990s    Right, Doran Durand procedure   . HX CATARACT REMOVAL Bilateral 2013    r and l eyes with implants   . HX CESAREAN SECTION  06/20/2000    x3, 11/21/86, 11/10/84   . HX CHOLECYSTECTOMY  1988   . HX CORONARY ARTERY BYPASS GRAFT  03/31/2011    cardiac, 2 vessel, Dr. [name], [facility]   . HX CORONARY STENT PLACEMENT  2012    x4 stents   . HX GASTRIC SLEEVE  02/2017    [place], [place], Dr. Pearlie Oyster   . HX HEART CATHETERIZATION  2012    massive heart attack and stents - [facility]   . HX THYROID BIOPSY     . HX THYROIDECTOMY  101/01/2018   . HX TONSILLECTOMY      as child   . HX YAG Left 09/20/2013         Current Outpatient Medications   Medication Sig   . ACCU-CHEK AVIVA PLUS TEST STRP Strip USE 1 STRIP TO TEST BLOOD SUGAR THREE TIMES A DAY   . Blood-Glucose Meter (ACCU-CHEK AVIVA PLUS METER) Misc 1 Kit by Does not apply route Three times a day   . clopidogreL (PLAVIX) 75 mg Oral Tablet TAKE 1 TABLET BY MOUTH ONCE DAILY   . cyanocobalamin, vitamin B-12, 1,000 mcg/mL Oral Drops Take 1 mL (1,000 mcg total) by mouth Every 7 days   . diclofenac sodium (VOLTAREN) 1 % Gel Apply 2 g topically Three times a day as needed   . docusate sodium (COLACE) 100 mg Oral Capsule Take 1 Cap (100 mg total) by mouth Twice daily   . empagliflozin (JARDIANCE) 10 mg Oral Tablet Take 10 mg by mouth Once a day   . ergocalciferol, vitamin D2, (DRISDOL) 1,250 mcg (50,000 unit) Oral Capsule TAKE 1 CAPSULE BY MOUTH EVERY WEEK   . furosemide (LASIX) 40 mg  Oral Tablet TAKE 1 TABLET(40 MG) BY MOUTH EVERY DAY   . HYDROcodone-acetaminophen (NORCO) 10-325 mg Oral Tablet Take 1 Tablet by  mouth Every 8 hours as needed for Pain [name] with [facility]   . levothyroxine (SYNTHROID) 25 mcg Oral Tablet Take 1 Tablet (25 mcg total) by mouth Once a day   . lisinopriL (PRINIVIL) 2.5 mg Oral Tablet TAKE 1 TABLET BY MOUTH EVERY DAY   . metFORMIN (GLUCOPHAGE) 500 mg Oral Tablet TAKE 1 TABLET BY MOUTH THREE TIMES DAILY WITH MEALS   . metoprolol succinate (TOPROL-XL) 25 mg Oral Tablet Sustained Release 24 hr Take 0.5 Tablets (12.5 mg total) by mouth Once a day   . NARCAN 4 mg/actuation Nasal Spray, Non-Aerosol PT STATED SHE HAS NARCAN AT HOME BUT IS NOT TAKING IT   . niacin (NIASPAN) 500 mg Oral Tablet Sustained Release Take 1 Tab (500 mg total) by mouth Once a day   . nitroGLYCERIN (NITROSTAT) 0.4 mg Sublingual Tablet, Sublingual Place 1 Tablet (0.4 mg total) under the tongue Every 5 minutes as needed for Chest pain for 3 doses over 15 minutes   . omeprazole (PRILOSEC) 40 mg Oral Capsule, Delayed Release(E.C.) TAKE 1 CAPSULE BY MOUTH ONCE DAILY   . ondansetron (ZOFRAN ODT) 8 mg Oral Tablet, Rapid Dissolve DISSOLVE 1 TABLET(8 MG) ON THE TONGUE EVERY 8 HOURS AS NEEDED FOR NAUSEA OR VOMITING   . PRENATAL PLUS, CALCIUM CARB, 27 mg iron- 1 mg Oral Tablet Take 1 Tab by mouth Once a day   . PROVENTIL HFA 90 mcg/actuation Inhalation HFA Aerosol Inhaler Take 1-2 Puffs by inhalation Every 4 hours as needed   . rosuvastatin (CRESTOR) 40 mg Oral Tablet Take 1 Tablet (40 mg total) by mouth Once a day TAKE 1 TABLET BY MOUTH ONCE DAILY   . semaglutide (OZEMPIC) 0.25 mg or 0.5 mg(2 mg/1.5 mL) Subcutaneous Pen Injector Inject 0.5 mg under the skin Every 7 days   . venlafaxine (EFFEXOR XR) 37.5 mg Oral Capsule, Sust. Release 24 hr TAKE 1 CAPSULE BY MOUTH EVERY DAY   . ZYRTEC-D 5-120 mg Oral Tablet Sustained Release 12 hr Take 1 Tab by mouth Once a day     Allergies   Allergen Reactions   . Ane Payment [Pyrilamine-Dextromethorphan]  Other Adverse Reaction (Add comment)     LIGHT HEADED   . Tylenol W Codeine  [Acetaminophen-Codeine]  Other Adverse Reaction (Add comment)     pt states that one time she had chest pains with this med, but dr. thought it was because she had not eaten     Family Medical History:     Problem Relation (Age of Onset)    Atrial fibrillation Sister    Colon Cancer Father    Diabetes Mother, Maternal Aunt    Heart Disease Sister, Maternal Grandfather    Hypertension (High Blood Pressure) Mother    Lung Cancer Maternal Grandfather    MI <45 years of age Mother          Social History     Tobacco Use   . Smoking status: Current Every Day Smoker     Packs/day: 0.50     Years: 20.00     Pack years: 10.00     Types: Cigarettes     Start date: 65   . Smokeless tobacco: Never Used   Vaping Use   . Vaping Use: Never used   Substance Use Topics   . Alcohol use: No   .  Drug use: No       Review of Systems  Pertinent positives are listed in hpi    Physical Exam:    Vitals:    05/08/21 1115   BP: 110/68   Pulse: 84   Resp: 18   Temp: 36.1 C (96.9 F)   SpO2: 99%   Weight: 96.2 kg (212 lb)   Height: 1.676 m (_0 )   BMI: 34.29          Physical Exam  HENT:      Head: Normocephalic and atraumatic.      Mouth/Throat:      Pharynx: No pharyngeal swelling, oropharyngeal exudate or posterior oropharyngeal erythema.   Neck:      Thyroid: No thyromegaly.      Trachea: No tracheal deviation.   Cardiovascular:      Rate and Rhythm: Normal rate and regular rhythm.      Pulses:           Posterior tibial pulses are 2+ on the right side and 2+ on the left side.      Heart sounds: Normal heart sounds. No murmur heard.  Pulmonary:      Effort: Pulmonary effort is normal. No respiratory distress.      Breath sounds: Normal breath sounds. No wheezing or rales.   Musculoskeletal:      Right knee: No swelling or deformity.      Left knee: Crepitus present. No swelling, deformity or bony tenderness. Normal range of motion. No tenderness. No LCL laxity, MCL laxity or ACL laxity.Normal pulse.      Right lower leg: No edema.       Left lower leg: No tenderness. No edema.   Lymphadenopathy:      Cervical: No cervical adenopathy.   Skin:     General: Skin is warm and dry.      Coloration: Skin is not pale.      Findings: No erythema or rash.   Neurological:      Mental Status: She is alert and oriented to person, place, and time.      Gait: Gait is intact.   Psychiatric:         Mood and Affect: Mood and affect normal.       COMPREHENSIVE METABOLIC PANEL - NON FASTING  Lab Results   Component Value Date    SODIUM 139 05/08/2021    POTASSIUM 4.7 05/08/2021    CHLORIDE 107 05/08/2021    CO2 26 05/08/2021    ANIONGAP 6 05/08/2021    BUN 17 05/08/2021    CREATININE 0.90 05/08/2021    GLUCOSENF 177 (H) 05/08/2021    CALCIUM 9.9 05/08/2021    ALBUMIN 4.2 05/08/2021    TOTALPROTEIN 6.8 05/08/2021    ALKPHOS 92 05/08/2021    AST 21 05/08/2021    ALT 14 05/08/2021       Assessment/Plan:  Records from [facility] ED reviewed including visit note, imaging results, and lab results    1. AKI (acute kidney injury) (CMS HCC)  Recommend not taking ibuprofen, plan BMP today. Advised her to continue lisinopril and Metoprolol succinate and all other meds as RX. She will follow up with me in a week but if dizziness recurs, she will call the office.  CMP results reviewed after appointment--creatinine and BUN normal.  - BASIC METABOLIC PANEL; Future    2. Elevated serum creatinine  Avoid NSAID'S such as ibuprofen, plan BMP today, follow up one week--see above  - BASIC  METABOLIC PANEL; Future    3. Dizziness  None since prior to ED--to call office if this recurs  - BASIC METABOLIC PANEL; Future    4. Hypertension associated with type 2 diabetes mellitus (CMS HCC)  BP at goal today--see above    5. Type 2 diabetes mellitus with complication, without long-term current use of insulin (CMS HCC)  Following with Dr. Alvino Chapel, fasting sugars above goal but improved  - BASIC METABOLIC PANEL; Future    6. Peripheral vascular disease, unspecified (CMS HCC)  Chronic and stabl3  -  BASIC METABOLIC PANEL; Future    7. History of CVA (cerebrovascular accident)      8. Coronary artery disease involving native coronary artery of native heart without angina pectoris  Follows with cardiology  - BASIC METABOLIC PANEL; Future    9. Obesity (BMI 30.0-34.9)           Orders Placed This Encounter   . BASIC METABOLIC PANEL                  Follow up: Return in about 1 week (around 05/15/2021).    Seek medical attention for new or worsening symptoms.    Patient has been seen in this clinic within the last 3 years.     Georga Hacking, FNP-BC     This note may have been partially generated using MModal Fluency Direct system, and there may be some incorrect words, spellings, and punctuation that were not noted in checking the note before saving, though effort was made to avoid such errors.

## 2021-05-08 NOTE — Ancillary Notes (Signed)
Department of Community Practice     Venipuncture performed in office on right arm antecubital vein, dry pressure dressing was applied to site and patient tolerated it well.  Specimen was centrifuged, aliquoted as needed and specimen was labeled and packaged for transport.    Francetta Found, PHLEBOTOMIST  05/08/2021, 12:25

## 2021-05-10 NOTE — Result Encounter Note (Signed)
Please inform the patient that her labs are back.    Her blood count showed that her platelet count is elevated.    I am unsure if she has been seen by Hematology for this, as she has had this problem previously.  As long as she is asymptomatic we can discuss this again at our next encounter.  But if she would like an order to see Hematology I would be happy to place that now.    Otherwise her labs were reassuring.    TY TH

## 2021-05-15 ENCOUNTER — Ambulatory Visit: Payer: Commercial Managed Care - PPO | Attending: Family

## 2021-05-15 ENCOUNTER — Encounter (INDEPENDENT_AMBULATORY_CARE_PROVIDER_SITE_OTHER): Payer: Self-pay | Admitting: Family

## 2021-05-15 ENCOUNTER — Other Ambulatory Visit: Payer: Self-pay

## 2021-05-15 ENCOUNTER — Ambulatory Visit (INDEPENDENT_AMBULATORY_CARE_PROVIDER_SITE_OTHER): Payer: Commercial Managed Care - PPO | Admitting: Family

## 2021-05-15 VITALS — BP 110/70 | HR 64 | Temp 97.0°F | Resp 18 | Ht 66.0 in | Wt 202.0 lb

## 2021-05-15 DIAGNOSIS — I152 Hypertension secondary to endocrine disorders: Secondary | ICD-10-CM

## 2021-05-15 DIAGNOSIS — M5416 Radiculopathy, lumbar region: Secondary | ICD-10-CM

## 2021-05-15 DIAGNOSIS — R29898 Other symptoms and signs involving the musculoskeletal system: Secondary | ICD-10-CM

## 2021-05-15 DIAGNOSIS — E1159 Type 2 diabetes mellitus with other circulatory complications: Secondary | ICD-10-CM

## 2021-05-15 DIAGNOSIS — R42 Dizziness and giddiness: Secondary | ICD-10-CM

## 2021-05-15 DIAGNOSIS — I6529 Occlusion and stenosis of unspecified carotid artery: Secondary | ICD-10-CM

## 2021-05-15 DIAGNOSIS — Z6832 Body mass index (BMI) 32.0-32.9, adult: Secondary | ICD-10-CM

## 2021-05-15 DIAGNOSIS — E118 Type 2 diabetes mellitus with unspecified complications: Secondary | ICD-10-CM

## 2021-05-15 NOTE — Progress Notes (Signed)
PRIMARY CARE, MID- [facility]  [place] [address]    Progress Note    Name: Monique [name] MRN:  [id number]   Date: 05/15/2021 Age: 55 y.o.         Reason for Visit: Follow Up    History of Present Illness  Monique [name] is a 55 y.o. female who is being seen today for follow up.   Had stopped taking lisinopril ane metoprolol prior to hospitalization and restarted it the day before hospital follow up.  She reported not having dizziness since hospital discharge.  She was advised to continue taking the lisinopril and metoprolol.  Labs were done on 05/08/2021--creatinine WNL.  She presents with her husband today for follow up . She reports she has had 3 dizzy spells since her last appt, each of which resolved in a few seconds.  Denies syncope, chest pain, dyspnea, leg swelling .Reports she has had problems with left leg giving out occasionally--reports the left leg will get weak and unstable--states she will grab onto something to keep her from falling.  This has been happening for at least 6 months.  She denies knee pain or instability.  Has never passed out.  Denies leg numbness.  She has chronic lower back pain that radiates to left hip area.  She is following with Dr. Archie Patten for pain management who RX Vicodin.  She has follow up with Dr. Alvino Chapel that she sees for DM--denies hypoglycemia.  Fasting sugar this arm was 160.    Past Medical History:   Diagnosis Date   . Anxiety    . Carotid stenosis    . Chronic back pain    . Coronary artery disease involving native coronary artery of native heart    . DDD (degenerative disc disease), lumbar     "entire spine"   . Ear piercing    . GERD (gastroesophageal reflux disease)    . H/O complete eye exam     2 years, Dr. [name], at [facility]   . History of dental examination     dentures upper and lower   . HTN    . Hypercholesterolemia    . Hyperlipidemia    . MI (myocardial infarction) (CMS [place]) 2012   . Neck problem     herniated  cervical disc   . Obesity (BMI 30-39.9)    . 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 [place] ordered.    . Stroke (CMS [facility])     2002   . Tattoo     Left breast, Right shoulder   . Thyroid disorder    . Thyroid nodule    . Uncontrolled type 2 diabetes mellitus, without long-term current use of insulin (CMS [place])    . Xanthelasma          Past Surgical History:   Procedure Laterality Date   . ENDOMETRIAL ABLATION  1998   . HX ANKLE FRACTURE TX  1990s    Right, Doran Durand procedure   . HX CATARACT REMOVAL Bilateral 2013    r and l eyes with implants   . HX CESAREAN SECTION  06/20/2000    x3, 11/21/86, 11/10/84   . HX CHOLECYSTECTOMY  1988   . HX CORONARY ARTERY BYPASS GRAFT  03/31/2011    cardiac, 2 vessel, Dr. [name], [facility]   . HX CORONARY STENT PLACEMENT  2012    x4  stents   . HX GASTRIC SLEEVE  02/2017    [place], [place], Dr. Pearlie Oyster   . HX HEART CATHETERIZATION  2012    mass[facility] heart attack [facility] stents - [facility]   . HX THYROID BIOPSY     . HX THYROIDECTOMY  101/01/2018   . HX TONSILLECTOMY      as child   . HX YAG Left 09/20/2013         Current Outpatient Medications   Medication Sig   . ACCU-CHEK AVIVA PLUS TEST STRP Strip USE 1 STRIP TO TEST BLOOD SUGAR THREE TIMES A DAY   . Blood-Glucose Meter (ACCU-CHEK AVIVA PLUS METER) Misc 1 Kit by Does not apply route Three times a day   . clopidogreL (PLAVIX) 75 mg Oral Tablet TAKE 1 TABLET BY MOUTH ONCE DAILY   . cyanocobalamin, vitamin B-12, 1,000 mcg/mL Oral Drops Take 1 mL (1,000 mcg total) by mouth Every 7 days   . diclofenac sodium (VOLTAREN) 1 % Gel Apply 2 g topically Three times a day as needed   . docusate sodium (COLACE) 100 mg Oral Capsule Take 1 Cap (100 mg total) by mouth Twice daily   . empagliflozin (JARDIANCE) 10 mg Oral Tablet Take 10 mg by mouth Once a day   . ergocalciferol, vitamin D2, (DRISDOL) 1,250 mcg (50,000 unit) Oral Capsule TAKE 1 CAPSULE BY MOUTH EVERY WEEK   . furosemide (LASIX) 40 mg  Oral Tablet TAKE 1 TABLET(40 MG) BY MOUTH EVERY DAY   . HYDROcodone-acetaminophen (NORCO) 10-325 mg Oral Tablet Take 1 Tablet by mouth Every 8 hours as needed for Pain [name] with [facility]   . levothyroxine (SYNTHROID) 25 mcg Oral Tablet Take 1 Tablet (25 mcg total) by mouth Once a day   . lisinopriL (PRINIVIL) 2.5 mg Oral Tablet TAKE 1 TABLET BY MOUTH EVERY DAY   . metFORMIN (GLUCOPHAGE) 500 mg Oral Tablet TAKE 1 TABLET BY MOUTH THREE TIMES DAILY WITH MEALS   . metoprolol succinate (TOPROL-XL) 25 mg Oral Tablet Sustained Release 24 hr Take 0.5 Tablets (12.5 mg total) by mouth Once a day   . NARCAN 4 mg/actuation Nasal Spray, Non-Aerosol PT STATED SHE HAS NARCAN AT HOME BUT IS NOT TAKING IT   . niacin (NIASPAN) 500 mg Oral Tablet Sustained Release Take 1 Tab (500 mg total) by mouth Once a day   . nitroGLYCERIN (NITROSTAT) 0.4 mg Sublingual Tablet, Sublingual Place 1 Tablet (0.4 mg total) under the tongue Every 5 minutes as needed for Chest pain for 3 doses over 15 minutes   . omeprazole (PRILOSEC) 40 mg Oral Capsule, Delayed Release(E.C.) TAKE 1 CAPSULE BY MOUTH ONCE DAILY   . ondansetron (ZOFRAN ODT) 8 mg Oral Tablet, Rapid Dissolve DISSOLVE 1 TABLET(8 MG) ON THE TONGUE EVERY 8 HOURS AS NEEDED FOR NAUSEA OR VOMITING   . PRENATAL PLUS, CALCIUM CARB, 27 mg iron- 1 mg Oral Tablet Take 1 Tab by mouth Once a day   . PROVENTIL HFA 90 mcg/actuation Inhalation HFA Aerosol Inhaler Take 1-2 Puffs by inhalation Every 4 hours as needed   . rosuvastatin (CRESTOR) 40 mg Oral Tablet Take 1 Tablet (40 mg total) by mouth Once a day TAKE 1 TABLET BY MOUTH ONCE DAILY   . semaglutide (OZEMPIC) 0.25 mg or 0.5 mg(2 mg/1.5 mL) Subcutaneous Pen Injector Inject 0.5 mg under the skin Every 7 days   . venlafaxine (EFFEXOR XR) 37.5 mg Oral Capsule, Sust. Release 24 hr TAKE 1 CAPSULE BY MOUTH EVERY DAY   .  ZYRTEC-D 5-120 mg Oral Tablet Sustained Release 12 hr Take 1 Tab by mouth Once a day     Allergies   Allergen  Reactions   . Ane Payment [Pyrilamine-Dextromethorphan]  Other Adverse Reaction (Add comment)     LIGHT HEADED   . Tylenol W Codeine [Acetaminophen-Codeine]  Other Adverse Reaction (Add comment)     pt states that one time she had chest pains with this med, but dr. thought it was because she had not eaten     Family Medical History:     Problem Relation (Age of Onset)    Atrial fibrillation Sister    Colon Cancer Father    Diabetes Mother, Maternal Aunt    Heart Disease Sister, Maternal Grandfather    Hypertension (High Blood Pressure) Mother    Lung Cancer Maternal Grandfather    MI <63 years of age Mother          Social History     Tobacco Use   . Smoking status: Current Every Day Smoker     Packs/day: 0.50     Years: 20.00     Pack years: 10.00     Types: Cigarettes     Start date: 12   . Smokeless tobacco: Never Used   Vaping Use   . Vaping Use: Never used   Substance Use Topics   . Alcohol use: No   . Drug use: No       Review of Systems  Pertinent positives are listed in hpi    Physical Exam:    Vitals:    05/15/21 1555   BP: 110/70   Pulse: 64   Resp: 18   Temp: 36.1 C (97 F)   SpO2: 99%   Weight: 91.6 kg (202 lb)   Height: 1.676 m (5' 6" )   BMI: 32.67          Physical Exam  HENT:      Head: Normocephalic and atraumatic.      Mouth/Throat:      Pharynx: No pharyngeal swelling, oropharyngeal exudate or posterior oropharyngeal erythema.   Eyes:      General: Lids are normal.      Pupils: Pupils are equal, round, and reactive to light.   Neck:      Thyroid: No thyromegaly.      Vascular: No carotid bruit.      Trachea: No tracheal deviation.   Cardiovascular:      Rate and Rhythm: Normal rate and regular rhythm.      Pulses:           Posterior tibial pulses are 2+ on the right side and 2+ on the left side.      Heart sounds: Normal heart sounds. No murmur heard.  Pulmonary:      Effort: Pulmonary effort is normal. No respiratory distress.      Breath sounds: Normal breath sounds. No wheezing or rales.    Musculoskeletal:      Right knee: No swelling or deformity. Normal pulse.      Left knee: No swelling or deformity. Normal pulse.      Right lower leg: No edema.      Left lower leg: No tenderness. No edema.   Lymphadenopathy:      Cervical: No cervical adenopathy.   Skin:     General: Skin is warm and dry.      Coloration: Skin is not pale.      Findings: No erythema or rash.  Neurological:      Mental Status: She is alert and oriented to person, place, and time.      Gait: Gait is intact.      Deep Tendon Reflexes:      Reflex Scores:       Patellar reflexes are 2+ on the right side and 2+ on the left side.     Comments: Strength of all 4 extremities 5/5   Psychiatric:         Mood and Affect: Mood and affect normal.         Assessment/Plan:      1. Left leg weakness  Differentials lumbar radiculopathy/stenosis vs TIA, plan carotid doppler CT brain, Xray lumbar spine, plan MRI lumbar spine  - XR LUMBAR SPINE SERIES; Future  - CT BRAIN WO IV CONTRAST; Future    2. Dizziness  Brief spells x3 in last week, await carotid doppler and CT Brain results  - CT BRAIN WO IV CONTRAST; Future    3. Chronic lumbar radiculopathy    - XR LUMBAR SPINE SERIES; Future    4. Stenosis of carotid artery, unspecified laterality    - CAROTID ARTERY DUPLEX; Future    5. Hypertension associated with type 2 diabetes mellitus (CMS HCC)  Chronic and controlled, continue same meds    6. Type 2 diabetes mellitus with complication, without long-term current use of insulin (CMS HCC)  Chronic, follows with Dr. Alvino Chapel    7. BMI 32.0-32.9,adult           Orders Placed This Encounter   . XR LUMBAR SPINE SERIES   . CANCELED: CT BRAIN WO IV CONTRAST   . CT BRAIN WO IV CONTRAST   . CANCELED: CAROTID ARTERY DUPLEX   . CAROTID ARTERY DUPLEX                  Follow up: Return keep appt next month with, for with pcp.    Seek medical attention for new or worsening symptoms.    Patient has been seen in this clinic within the last 3 years.     Georga Hacking, FNP-BC     This note may have been partially generated using MModal Fluency Direct system, and there may be some incorrect words, spellings, and punctuation that were not noted in checking the note before saving, though effort was made to avoid such errors.

## 2021-05-16 NOTE — Addendum Note (Signed)
Addended by: Rolland Porter on: 05/16/2021 10:10 AM     Modules accepted: Orders

## 2021-05-20 ENCOUNTER — Telehealth (INDEPENDENT_AMBULATORY_CARE_PROVIDER_SITE_OTHER): Payer: Self-pay | Admitting: Family Medicine

## 2021-05-20 ENCOUNTER — Encounter (INDEPENDENT_AMBULATORY_CARE_PROVIDER_SITE_OTHER): Payer: Commercial Managed Care - PPO | Admitting: PSYCHIATRY AND NEUROLOGY-NEUROLOGY

## 2021-05-20 NOTE — Telephone Encounter (Signed)
Patient requests refill of Ondansetron be called into [facility]  05/20/2021, 11:37

## 2021-05-21 ENCOUNTER — Other Ambulatory Visit: Payer: Self-pay

## 2021-05-21 ENCOUNTER — Ambulatory Visit: Payer: Commercial Managed Care - PPO | Attending: Family

## 2021-05-21 DIAGNOSIS — R42 Dizziness and giddiness: Secondary | ICD-10-CM | POA: Insufficient documentation

## 2021-05-21 DIAGNOSIS — R29898 Other symptoms and signs involving the musculoskeletal system: Secondary | ICD-10-CM | POA: Insufficient documentation

## 2021-05-21 DIAGNOSIS — I6529 Occlusion and stenosis of unspecified carotid artery: Secondary | ICD-10-CM | POA: Insufficient documentation

## 2021-05-22 ENCOUNTER — Other Ambulatory Visit (INDEPENDENT_AMBULATORY_CARE_PROVIDER_SITE_OTHER): Payer: Self-pay | Admitting: Family Medicine

## 2021-05-22 DIAGNOSIS — R110 Nausea: Secondary | ICD-10-CM

## 2021-05-22 MED ORDER — ONDANSETRON 8 MG DISINTEGRATING TABLET
ORAL_TABLET | ORAL | 3 refills | Status: DC
Start: 2021-05-22 — End: 2021-10-22

## 2021-05-23 ENCOUNTER — Telehealth (INDEPENDENT_AMBULATORY_CARE_PROVIDER_SITE_OTHER): Payer: Self-pay | Admitting: Family Medicine

## 2021-05-23 DIAGNOSIS — M5416 Radiculopathy, lumbar region: Secondary | ICD-10-CM

## 2021-05-23 DIAGNOSIS — R29898 Other symptoms and signs involving the musculoskeletal system: Secondary | ICD-10-CM

## 2021-05-23 NOTE — Telephone Encounter (Signed)
Pt returned call. Advised of Lumbar results. In agreement with MRI Lumbar. Questions if can be done wide open and if not if may have Ativan prior to procedure.

## 2021-05-23 NOTE — Telephone Encounter (Signed)
-----   Message from Georga Hacking, FNP-BC sent at 05/15/2021 10:26 PM EDT -----  lumbars spine with degenerative changes--recommend lumbar spine MRI without contrast for DX chronic lower back pain and left leg weakness

## 2021-05-27 ENCOUNTER — Other Ambulatory Visit: Payer: Self-pay

## 2021-05-27 ENCOUNTER — Ambulatory Visit (INDEPENDENT_AMBULATORY_CARE_PROVIDER_SITE_OTHER): Payer: Commercial Managed Care - PPO

## 2021-05-27 ENCOUNTER — Encounter (HOSPITAL_BASED_OUTPATIENT_CLINIC_OR_DEPARTMENT_OTHER): Payer: Self-pay

## 2021-05-27 VITALS — BP 120/68 | HR 70 | Ht 65.2 in | Wt 212.2 lb

## 2021-05-27 DIAGNOSIS — Z951 Presence of aortocoronary bypass graft: Secondary | ICD-10-CM

## 2021-05-27 DIAGNOSIS — I10 Essential (primary) hypertension: Secondary | ICD-10-CM

## 2021-05-27 DIAGNOSIS — H539 Unspecified visual disturbance: Secondary | ICD-10-CM

## 2021-05-27 DIAGNOSIS — F17200 Nicotine dependence, unspecified, uncomplicated: Secondary | ICD-10-CM

## 2021-05-27 DIAGNOSIS — I2510 Atherosclerotic heart disease of native coronary artery without angina pectoris: Secondary | ICD-10-CM

## 2021-05-27 DIAGNOSIS — I6523 Occlusion and stenosis of bilateral carotid arteries: Secondary | ICD-10-CM

## 2021-05-27 DIAGNOSIS — E7800 Pure hypercholesterolemia, unspecified: Secondary | ICD-10-CM

## 2021-05-27 DIAGNOSIS — R42 Dizziness and giddiness: Secondary | ICD-10-CM

## 2021-05-27 MED ORDER — PROVENTIL HFA 90 MCG/ACTUATION AEROSOL INHALER
1.0000 | INHALATION_SPRAY | RESPIRATORY_TRACT | 2 refills | Status: DC | PRN
Start: 2021-05-27 — End: 2021-06-11

## 2021-05-27 NOTE — Progress Notes (Signed)
[facility]  [address]  [place] [address]  [phone]    Date:   05/27/2021  Name: Monique [name]  Age: 55 y.o.    REASON FOR VISIT:   Follow Up and Medication Refill    CARDIAC HISTORY/HPI:     Monique [name] is a 55 y.o. female with past medical history of coronary artery disease status post CABG in 2012 followed by MI which required PCI to the LIMA to the LAD, left main and ostial circ. She has had gastric sleeve surgery with weight loss of 100 lbs.    -Stress test 11/2019 revealed no evidence of ischemia.   -Echocardiogram 11/2019 showed normal LV function and no significant valvular abnormality.   -Carotid artery ultrasound 11/2019 showed right internal carotid artery with less than 50% stenosed and no flow was detected in the left ICA( previously noted)    She was last seen in April of 2022 where she was doing well from a cardiovascular standpoint.     Patient presents today with complaints of dizziness and vision changes along with left lower extremity weakness.  Her blood pressure has been labile and lower than usual.  She has no anginal symptoms or signs and symptoms of heart failure.  She does have a history of carotid artery stenosis for which she follows closely with vascular surgery.    Current Outpatient Medications   Medication Sig    ACCU-CHEK AVIVA PLUS TEST STRP Strip USE 1 STRIP TO TEST BLOOD SUGAR THREE TIMES A DAY    Blood-Glucose Meter (ACCU-CHEK AVIVA PLUS METER) Misc 1 Kit by Does not apply route Three times a day    clopidogreL (PLAVIX) 75 mg Oral Tablet TAKE 1 TABLET BY MOUTH ONCE DAILY    cyanocobalamin, vitamin B-12, 1,000 mcg/mL Oral Drops Take 1 mL (1,000 mcg total) by mouth Every 7 days    diclofenac sodium (VOLTAREN) 1 % Gel Apply 2 g topically Three times a day as needed    docusate sodium (COLACE) 100 mg Oral Capsule Take 1 Cap (100 mg total) by mouth Twice daily    empagliflozin (JARDIANCE) 10 mg Oral Tablet Take 10 mg by mouth  Once a day    ergocalciferol, vitamin D2, (DRISDOL) 1,250 mcg (50,000 unit) Oral Capsule TAKE 1 CAPSULE BY MOUTH EVERY WEEK    furosemide (LASIX) 40 mg Oral Tablet TAKE 1 TABLET(40 MG) BY MOUTH EVERY DAY    HYDROcodone-acetaminophen (NORCO) 10-325 mg Oral Tablet Take 1 Tablet by mouth Every 8 hours as needed for Pain [name] with [facility]    levothyroxine (SYNTHROID) 25 mcg Oral Tablet Take 1 Tablet (25 mcg total) by mouth Once a day    lisinopriL (PRINIVIL) 2.5 mg Oral Tablet TAKE 1 TABLET BY MOUTH EVERY DAY    metFORMIN (GLUCOPHAGE) 500 mg Oral Tablet TAKE 1 TABLET BY MOUTH THREE TIMES DAILY WITH MEALS    metoprolol succinate (TOPROL-XL) 25 mg Oral Tablet Sustained Release 24 hr Take 0.5 Tablets (12.5 mg total) by mouth Once a day    NARCAN 4 mg/actuation Nasal Spray, Non-Aerosol PT STATED SHE HAS NARCAN AT HOME BUT IS NOT TAKING IT    niacin (NIASPAN) 500 mg Oral Tablet Sustained Release Take 1 Tab (500 mg total) by mouth Once a day    nitroGLYCERIN (NITROSTAT) 0.4 mg Sublingual Tablet, Sublingual Place 1 Tablet (0.4 mg total) under the tongue Every 5 minutes as needed for Chest pain for 3 doses over 15  minutes    omeprazole (PRILOSEC) 40 mg Oral Capsule, Delayed Release(E.C.) TAKE 1 CAPSULE BY MOUTH ONCE DAILY    ondansetron (ZOFRAN ODT) 8 mg Oral Tablet, Rapid Dissolve DISSOLVE 1 TABLET(8 MG) ON THE TONGUE EVERY 8 HOURS AS NEEDED FOR NAUSEA OR VOMITING    PRENATAL PLUS, CALCIUM CARB, 27 mg iron- 1 mg Oral Tablet Take 1 Tab by mouth Once a day    PROVENTIL HFA 90 mcg/actuation Inhalation HFA Aerosol Inhaler Take 1-2 Puffs by inhalation Every 4 hours as needed    rosuvastatin (CRESTOR) 40 mg Oral Tablet Take 1 Tablet (40 mg total) by mouth Once a day TAKE 1 TABLET BY MOUTH ONCE DAILY    semaglutide (OZEMPIC) 0.25 mg or 0.5 mg(2 mg/1.5 mL) Subcutaneous Pen Injector Inject 0.5 mg under the skin Every 7 days    venlafaxine (EFFEXOR XR) 37.5 mg Oral Capsule, Sust. Release 24 hr TAKE 1  CAPSULE BY MOUTH EVERY DAY    ZYRTEC-D 5-120 mg Oral Tablet Sustained Release 12 hr Take 1 Tab by mouth Once a day     Allergies   Allergen Reactions    Capron Dm [Pyrilamine-Dextromethorphan]  Other Adverse Reaction (Add comment)     LIGHT HEADED    Tylenol W Codeine [Acetaminophen-Codeine]  Other Adverse Reaction (Add comment)     pt states that one time she had chest pains with this med, but dr. thought it was because she had not eaten     Past Medical History:   Diagnosis Date    Anxiety     Carotid stenosis     Chronic back pain     Coronary artery disease involving native coronary artery of native heart     DDD (degenerative disc disease), lumbar     "entire spine"    Ear piercing     GERD (gastroesophageal reflux disease)     H/O complete eye exam     2 years, Dr. [name], at [facility]    History of dental examination     dentures upper and lower    HTN     Hypercholesterolemia     Hyperlipidemia     MI (myocardial infarction) (CMS [facility]) 2012    Neck problem     herniated cervical disc    Obesity (BMI 30-39.9)     Solitary nodule of right lobe of thyroid 03/30/2018    Incidental finding on MRI thoracic spine. [place] ordered.     Stroke (CMS [facility])     2002    Tattoo     Left breast, Right shoulder    Thyroid disorder     Thyroid nodule     Uncontrolled type 2 diabetes mellitus, without long-term current use of insulin (CMS [place])     Xanthelasma          Past Surgical History:   Procedure Laterality Date    ENDOMETRIAL ABLATION  1998    HX ANKLE FRACTURE TX  1990s    Right, Doran Durand procedure    HX CATARACT REMOVAL Bilateral 2013    r and l eyes with implants    HX CESAREAN SECTION  06/20/2000    x3, 11/21/86, 11/10/84    HX CHOLECYSTECTOMY  1988    HX CORONARY ARTERY BYPASS GRAFT  03/31/2011    cardiac, 2 vessel, Dr. [name], Joanna  2012    x4 stents    HX GASTRIC SLEEVE  02/2017    [place], [place], Dr.  Shinn    HX HEART CATHETERIZATION  2012    massive heart attack and stents -  [facility]    HX THYROID BIOPSY      HX THYROIDECTOMY  101/01/2018    HX TONSILLECTOMY      as child    HX YAG Left 09/20/2013         Family Medical History:       Problem Relation (Age of Onset)    Atrial fibrillation Sister    Colon Cancer Father    Diabetes Mother, Maternal Aunt    Heart Disease Sister, Maternal Grandfather    Hypertension (High Blood Pressure) Mother    Lung Cancer Maternal Grandfather    MI <42 years of age Mother            Social History     Socioeconomic History    Marital status: Married     Spouse name: [name]    Number of children: 3    Years of education: completed 11th grade   Occupational History    Occupation: disabled   Tobacco Use    Smoking status: Current Every Day Smoker     Packs/day: 0.50     Years: 20.00     Pack years: 10.00     Types: Cigarettes     Start date: 1981    Smokeless tobacco: Never Used   Brewing technologist Use: Never used   Substance and Sexual Activity    Alcohol use: No    Drug use: No    Sexual activity: Yes     Partners: Male   Other Topics Concern    Routine Exercise No    Ability to Walk 2 Flight of Steps without SOB/CP Yes   Social History Narrative    Living situation: lives at home with husband, [name], and daughter.  1 dog named Duke and 1 cat named Lucky.    Nutrition:  Eats 5 small meals d/t gastric sleeve surgery.     Caffeine use: none, decaf coffee    Exercise: stays busy around house. No formal form of exercise    Seatbelt use: sometimes    Fire extinguishers in the home: yes    Smoke alarms in the home: yes    Carbon monoxide detectors in the home: yes    Nancy Fetter exposure: sunglasses        Arisa would like for husband, Evetta Renner,  to speak for her in the event she would become incapacitated.    Full code.        REVIEW OF SYSTEMS:    Constitutional: No fevers, chills, malaise or abnormal weight loss.   HEENT: No issues  Respiratory: See HPI  Cardiovascular: See HPI    Gastrointestinal: No abdominal pain, nausea, or vomiting  Musculoskeletal: No  weakness   Hematological: No issues  Genitourinary: No complaints  Skin: No rashes.  Neurological: Noweakness or loss of sensation  All other systems reviewed and are negative except as noted in [facility]OPI.    PHYSICAL EXAMINATION:     Vitals reviewed. BP 120/68 Comment: RTA   Pulse 70    Ht 1.656 m (5' 5.2")    Wt 96.3 kg (212 lb 3.2 oz)    LMP  (LMP Unknown)    SpO2 96%    BMI 35.10 kg/m       Vitals Filed         05/27/2021  1353 05/27/2021  1354  BP: 120/70 120/68      Pulse: 70 --              Constitutional: She is oriented to person, place, and time.  She appears well-developed and well-nourished.   Cardiovascular: Normal rate and rhythm without murmurs. No peripheral edema is noted.  Pulmonary/Chest: Lungs clear without wheezes, rales or rhonchi.    Musculoskeletal: No weakness.  Neurological: Alert and oriented x 4.    Skin: Skin is warm and dry.   Psychiatric:  Normal mood and affect.     Labs:     Lab Results   Component Value Date    HA1C 10.4 (H) 09/29/2018    LDLCHOL 96 05/08/2021    TRIG 153 (H) 05/08/2021    HDLCHOL 55 05/08/2021    CHOLESTEROL 182 05/08/2021      Lab Results   Component Value Date    SODIUM 139 05/08/2021    POTASSIUM 4.7 05/08/2021    CHLORIDE 107 05/08/2021    CO2 26 05/08/2021    BUN 17 05/08/2021    CREATININE 0.90 05/08/2021    GFR 72 05/08/2021    CALCIUM 9.9 05/08/2021       ASSESSMENT:  ENCOUNTER DIAGNOSES     ICD-10-CM   1. Coronary artery disease, unspecified vessel or lesion type, unspecified whether angina present, unspecified whether native or transplanted heart  I25.10   2. Hx of CABG  Z95.1   3. Hypertension, unspecified type  I10   4. Hypercholesterolemia  E78.00   5. Bilateral carotid artery stenosis  I65.23   6. Smoking  F17.200   7. Dizziness  R42   8. Vision changes  H53.9       PLAN:    Patient has new complaints of dizziness left eye vision changes and left lower extremity numbness.  She states her PCP advised her to call our office.  Her symptoms worsen with  position change.  Her blood pressure has been mildly low.  We will hold lisinopril to see if this improves her symptoms.  Patient was advised to follow-up with vascular for her carotid disease.  She has a known totally occluded left ICA.  She also reports having significant arthritis in her back which could be contributing to her left lower extremity numbness.  She was advised to follow-up with PCP or possibly seek neurology consult. Of note, patient had echocardiogram in December of 2020 which revealed preserved LV function and no significant valvular abnormalities.      CAD status post CABG with subsequent PCI-patient has no anginal complaints.  We will continue with aggressive risk factor modification and guideline directed medical therapy.  Patient also states the symptoms began after initiating Ozempic.  She was advised to discuss this with her PCP.      Smoking and dyspnea-patient does unfortunately continue to smoke.  She is requesting a refill on her albuterol.  She does not use this very often but her prescription is old and she states her dyspnea worsens in the summer.  Will order once could.  She was advised to follow with PCP for further refills.  Also encouraged smoking cessation.    Hyperlipidemia-patient's last H LDL was 96. Goal LDL is less than 70. If she is not at goal for her next lipid panel would recommend adding Zetia in addition to rosuvastatin.    Patient would like to come back in a few weeks to evaluate her symptoms after medication changes.  She was advised to call  with worsening symptoms prior to our visit.    Orders Placed This Encounter    PROVENTIL HFA 90 mcg/actuation Inhalation HFA Aerosol Inhaler       Return in about 22 days (around 06/18/2021) for Tr[facility] at [facility] .    Electronically signed by     Brandt Loosen, APRN,NP-C  05/27/2021, 13:07    I have reviewed the above history  and agree with the assessment and plan outlined in the nurse practitioner's note.  55 year old female  complaints of lightheadedness suggested of orthostasis.  Does not appear that orthostatic blood pressure and pulses were obtained however it is not unreasonable to hold lisinopril and evaluate her clinical response.  The patient does have known carotid artery disease and was advised to continue follow-up with vascular surgery and to follow-up with her PCP regarding the reported left lower extremity numbness.  Continue aggressive treatment of underlying cardiovascular risk factors to target an LDL less than 70.     Paulita Cradle, MD      This note may have been partially generated using MModal Fluency Direct system.  There may be some incorrect words, spellings, and punctuation that were not noted in checking the note before saving. Effort was made to avoid such errors.

## 2021-05-27 NOTE — Patient Instructions (Addendum)
Hold lisinopril and see if your BP increases with this.     Follow up with Dr. Russ Halo if you feel you are having side effects from [place].     May benefit from neuro evaluation for lower extremity numbness     If symptoms persist call us and we can try to hold metoprolol. My nurses name is Brodi.

## 2021-05-28 ENCOUNTER — Other Ambulatory Visit (HOSPITAL_BASED_OUTPATIENT_CLINIC_OR_DEPARTMENT_OTHER): Payer: Self-pay

## 2021-05-28 ENCOUNTER — Encounter (INDEPENDENT_AMBULATORY_CARE_PROVIDER_SITE_OTHER): Payer: Self-pay

## 2021-05-30 ENCOUNTER — Ambulatory Visit
Admission: RE | Admit: 2021-05-30 | Discharge: 2021-05-30 | Disposition: A | Discharge status: Home / Self Care | Payer: Commercial Managed Care - PPO | Source: Ambulatory Visit | Attending: Family | Admitting: Family

## 2021-05-30 ENCOUNTER — Other Ambulatory Visit: Payer: Self-pay

## 2021-05-30 DIAGNOSIS — R29898 Other symptoms and signs involving the musculoskeletal system: Secondary | ICD-10-CM | POA: Insufficient documentation

## 2021-05-30 DIAGNOSIS — R42 Dizziness and giddiness: Secondary | ICD-10-CM | POA: Insufficient documentation

## 2021-05-30 NOTE — Result Encounter Note (Signed)
Get the MRI, reduce risk, consult neuro and skilled services.

## 2021-06-06 ENCOUNTER — Other Ambulatory Visit (HOSPITAL_BASED_OUTPATIENT_CLINIC_OR_DEPARTMENT_OTHER): Payer: Self-pay | Admitting: Family Medicine

## 2021-06-06 DIAGNOSIS — K219 Gastro-esophageal reflux disease without esophagitis: Secondary | ICD-10-CM

## 2021-06-11 ENCOUNTER — Other Ambulatory Visit (HOSPITAL_BASED_OUTPATIENT_CLINIC_OR_DEPARTMENT_OTHER): Payer: Self-pay | Admitting: Family

## 2021-06-12 ENCOUNTER — Telehealth (INDEPENDENT_AMBULATORY_CARE_PROVIDER_SITE_OTHER): Payer: Self-pay | Admitting: Family Medicine

## 2021-06-12 ENCOUNTER — Ambulatory Visit (INDEPENDENT_AMBULATORY_CARE_PROVIDER_SITE_OTHER): Payer: Self-pay | Admitting: Family Medicine

## 2021-06-12 ENCOUNTER — Other Ambulatory Visit (INDEPENDENT_AMBULATORY_CARE_PROVIDER_SITE_OTHER): Payer: Self-pay | Admitting: Family

## 2021-06-12 ENCOUNTER — Ambulatory Visit (HOSPITAL_BASED_OUTPATIENT_CLINIC_OR_DEPARTMENT_OTHER): Payer: Self-pay | Admitting: Family

## 2021-06-12 DIAGNOSIS — F40240 Claustrophobia: Secondary | ICD-10-CM

## 2021-06-12 DIAGNOSIS — R9089 Other abnormal findings on diagnostic imaging of central nervous system: Secondary | ICD-10-CM

## 2021-06-12 DIAGNOSIS — I639 Cerebral infarction, unspecified: Secondary | ICD-10-CM

## 2021-06-12 NOTE — Telephone Encounter (Signed)
I spoke with the patient and discussed CT results.  She stated her leg was no longer giving out on her like it was.  Advised her of need for MRI Brain  and referral to neurology.  She already takes Plavix asa and is on a statin.  She will need  Ativan prior to MRI Brain-as she is claustrophobic. -advised her to let me know when it gets scheduled and I will prescribe the Ativan.

## 2021-06-12 NOTE — Telephone Encounter (Signed)
Pt called stating she had a MRI in [place] on 06/07/21    She asked what the results are    Pt can be reached at [phone]

## 2021-06-12 NOTE — Telephone Encounter (Signed)
This was to be addressed with the patient when PCP's nurse contact the patient with the recommendations that PCP ordered.

## 2021-06-12 NOTE — Telephone Encounter (Signed)
Jama Flavors ordered MRI. Lysbeth Penner, LPN  05/10/8465, 59:93

## 2021-06-12 NOTE — Telephone Encounter (Signed)
She needs to have this refilled by her PCP.

## 2021-06-12 NOTE — Telephone Encounter (Signed)
Pam had ordered a CT brain. Pam forwarded the CT brain to Dr Russ Halo for recommendations. At that point Dr Russ Halo ordered MRI Brain and neurology referral. These recommendations were forwarded to PCP's nurse to address. I do not see documentation showing that this was completed.

## 2021-06-12 NOTE — Telephone Encounter (Signed)
Called pt pharmacy and left vm stating Proventil inhaler denied by Marcelino Scot, NP.  Stated it needs refilled by PCP.  Renee Pain, MA  06/12/2021, 12:09

## 2021-06-12 NOTE — Telephone Encounter (Signed)
Patient calling requesting results of her CT Scan that Jama Flavors ordered      [phone]

## 2021-06-13 MED ORDER — LORAZEPAM 0.5 MG TABLET
ORAL_TABLET | ORAL | 0 refills | Status: DC
Start: 2021-06-13 — End: 2021-10-22

## 2021-06-13 NOTE — Telephone Encounter (Signed)
Pt is scheduled for MRI on Monday.

## 2021-06-13 NOTE — Telephone Encounter (Signed)
The notes from Dr Russ Halo were in the patient's chart but never routed. Pam went ahead and spoke with the patient and ordered the things that Dr Russ Halo wanted. This issue is taken care of. Thanks.

## 2021-06-13 NOTE — Telephone Encounter (Signed)
I don't see any notes from Dr. Russ Halo. I see where Pam entered the referral to Neurology and the MRI order. Hope authorized the MRI. Pam ordered the CT - and Pam discussed the CT results with the patient? Gerda Yin D. [place] Cerise, RN  06/13/2021, 08:50

## 2021-06-14 ENCOUNTER — Telehealth (INDEPENDENT_AMBULATORY_CARE_PROVIDER_SITE_OTHER): Payer: Self-pay | Admitting: Family Medicine

## 2021-06-14 ENCOUNTER — Other Ambulatory Visit (INDEPENDENT_AMBULATORY_CARE_PROVIDER_SITE_OTHER): Payer: Self-pay | Admitting: Family Medicine

## 2021-06-14 DIAGNOSIS — Z01818 Encounter for other preprocedural examination: Secondary | ICD-10-CM

## 2021-06-14 NOTE — Telephone Encounter (Signed)
Pt's MRI is scheduled for Monday.  States Pam was going to order an Ativan for prior to procedure.  Pt's regular pharmacy is closed on weekends, needing it sent to [facility]

## 2021-06-14 NOTE — Telephone Encounter (Signed)
Patient aware that this was sent in to [facility] on [facility] yesterday. Patient notes that she is out of town, but she can have her neighbor run over and pick it up.

## 2021-06-17 ENCOUNTER — Other Ambulatory Visit (INDEPENDENT_AMBULATORY_CARE_PROVIDER_SITE_OTHER): Payer: Self-pay

## 2021-06-18 ENCOUNTER — Encounter (HOSPITAL_BASED_OUTPATIENT_CLINIC_OR_DEPARTMENT_OTHER): Payer: Self-pay

## 2021-06-18 NOTE — Progress Notes (Deleted)
[facility]  [address]  [place] [address]  [phone]    Date:   06/18/2021  Name: Monique [name]  Age: 55 y.o.    REASON FOR VISIT:   No chief complaint on file.    CARDIAC HISTORY/HPI:     Monique [name] is a 55 y.o. female with past medical history of coronary artery disease status post CABG in 2012 followed by MI which requiredPCI to the LIMA to the LAD, left main and ostial circ. She has had gastric sleeve surgery with weight loss of 100 lbs.  -Stress test 11/2019 revealed no evidence of ischemia.  -Echocardiogram 11/2019 showed normal LV function and no significant valvular abnormality.  -Carotid artery ultrasound 11/2019 showed right internal carotid artery with less than 50% stenosed and no flow was detected in the left ICA (previously noted).    She was last seen on 6/20 with c/o dizziness and vision changes along with LLE weakness. She had reports of labile BP's along with hypotension. Lisinopril was held to see if symptoms improved and she was advised to follow-up with PCP related to lower extremity numbness and vascular related to hx if ICA occlusion.     Patient presents today for symptom follow-up.     Current Outpatient Medications   Medication Sig   . ACCU-CHEK AVIVA PLUS TEST STRP Strip USE 1 STRIP TO TEST BLOOD SUGAR THREE TIMES A DAY   . Blood-Glucose Meter (ACCU-CHEK AVIVA PLUS METER) Misc 1 Kit by Does not apply route Three times a day   . clopidogreL (PLAVIX) 75 mg Oral Tablet TAKE 1 TABLET BY MOUTH ONCE DAILY   . cyanocobalamin, vitamin B-12, 1,000 mcg/mL Oral Drops Take 1 mL (1,000 mcg total) by mouth Every 7 days   . diclofenac sodium (VOLTAREN) 1 % Gel Apply 2 g topically Three times a day as needed   . docusate sodium (COLACE) 100 mg Oral Capsule Take 1 Cap (100 mg total) by mouth Twice daily   . empagliflozin (JARDIANCE) 10 mg Oral Tablet Take 10 mg by mouth Once a day   . ergocalciferol, vitamin D2, (DRISDOL) 1,250 mcg (50,000  unit) Oral Capsule TAKE 1 CAPSULE BY MOUTH EVERY WEEK   . furosemide (LASIX) 40 mg Oral Tablet TAKE 1 TABLET(40 MG) BY MOUTH EVERY DAY   . HYDROcodone-acetaminophen (NORCO) 10-325 mg Oral Tablet Take 1 Tablet by mouth Every 8 hours as needed for Pain [name] with [facility]   . levothyroxine (SYNTHROID) 25 mcg Oral Tablet Take 1 Tablet (25 mcg total) by mouth Once a day   . lisinopriL (PRINIVIL) 2.5 mg Oral Tablet TAKE 1 TABLET BY MOUTH EVERY DAY   . LORazepam (ATIVAN) 0.5 mg Oral Tablet Take 45 minutes before MRI   . metFORMIN (GLUCOPHAGE) 500 mg Oral Tablet TAKE 1 TABLET BY MOUTH THREE TIMES DAILY WITH MEALS   . metoprolol succinate (TOPROL-XL) 25 mg Oral Tablet Sustained Release 24 hr Take 0.5 Tablets (12.5 mg total) by mouth Once a day   . NARCAN 4 mg/actuation Nasal Spray, Non-Aerosol PT STATED SHE HAS NARCAN AT HOME BUT IS NOT TAKING IT   . niacin (NIASPAN) 500 mg Oral Tablet Sustained Release Take 1 Tab (500 mg total) by mouth Once a day   . nitroGLYCERIN (NITROSTAT) 0.4 mg Sublingual Tablet, Sublingual Place 1 Tablet (0.4 mg total) under the tongue Every 5 minutes as needed for Chest pain for 3 doses over 15 minutes   . omeprazole (  PRILOSEC) 40 mg Oral Capsule, Delayed Release(E.C.) TAKE 1 CAPSULE BY MOUTH EVERY DAY   . ondansetron (ZOFRAN ODT) 8 mg Oral Tablet, Rapid Dissolve DISSOLVE 1 TABLET(8 MG) ON THE TONGUE EVERY 8 HOURS AS NEEDED FOR NAUSEA OR VOMITING   . PRENATAL PLUS, CALCIUM CARB, 27 mg iron- 1 mg Oral Tablet Take 1 Tab by mouth Once a day   . rosuvastatin (CRESTOR) 40 mg Oral Tablet Take 1 Tablet (40 mg total) by mouth Once a day TAKE 1 TABLET BY MOUTH ONCE DAILY   . semaglutide (OZEMPIC) 0.25 mg or 0.5 mg(2 mg/1.5 mL) Subcutaneous Pen Injector Inject 0.5 mg under the skin Every 7 days   . venlafaxine (EFFEXOR XR) 37.5 mg Oral Capsule, Sust. Release 24 hr TAKE 1 CAPSULE BY MOUTH EVERY DAY   . ZYRTEC-D 5-120 mg Oral Tablet Sustained Release 12 hr Take 1 Tab by mouth Once a  day     Allergies   Allergen Reactions   . Ane Payment [Pyrilamine-Dextromethorphan]  Other Adverse Reaction (Add comment)     LIGHT HEADED   . Tylenol W Codeine [Acetaminophen-Codeine]  Other Adverse Reaction (Add comment)     pt states that one time she had chest pains with this med, but dr. thought it was because she had not eaten     Past Medical History:   Diagnosis Date   . Anxiety    . Carotid stenosis    . Chronic back pain    . Coronary artery disease involving native coronary artery of native heart    . DDD (degenerative disc disease), lumbar     "entire spine"   . Ear piercing    . GERD (gastroesophageal reflux disease)    . H/O complete eye exam     2 years, [facility]. [name], at [facility]   . History of dental examination     dentures upper and lower   . HTN    . Hypercholesterolemia    . Hyperlipidemia    . MI (myocardial infarction) (CMS [place]) 2012   . Neck problem     herniated cervical disc   . Obesity (BMI 30-39.9)    . 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 [place] ordered.    . Stroke (CMS [facility]     2002   . Tattoo     Left breast, Right shoulder   . Thyroid disorder    . Thyroid nodule    . Uncontrolled type 2 diabetes mellitus, without long-term current use of insulin (CMS [place])    . Xanthelasma          Past Surgical History:   Procedure Laterality Date   . ENDOMETRIAL ABLATION  1998   . HX ANKLE FRACTURE TX  1990s    Right, Doran Durand procedure   . HX CATARACT REMOVAL Bilateral 2013    r and l eyes with implants   . HX CESAREAN SECTION  06/20/2000    x3, 11/21/86, 11/10/84   . HX CHOLECYSTECTOMY  1988   . HX CORONARY ARTERY BYPASS GRAFT  03/31/2011    cardiac, 2 vessel, Dr. [name], [facility]   . HX CORONARY STENT PLACEMENT  2012    x4 stents   . HX GASTRIC SLEEVE  02/2017    [place], [place], Dr. Pearlie Oyster   . HX HEART CATHETERIZATION  2012    massive heart attack and stents - [facility]   . HX THYROID BIOPSY     .  HX THYROIDECTOMY  101/01/2018   . HX  TONSILLECTOMY      as child   . HX YAG Left 09/20/2013         Family Medical History:     Problem Relation (Age of Onset)    Atrial fibrillation Sister    Colon Cancer Father    Diabetes Mother, Maternal Aunt    Heart Disease Sister, Maternal Grandfather    Hypertension (High Blood Pressure) Mother    Lung Cancer Maternal Grandfather    MI <19 years of age Mother          Social History     Socioeconomic History   . Marital status: Married     Spouse name: [name]   . Number of children: 3   . Years of education: completed 11th grade   Occupational History   . Occupation: disabled   Tobacco Use   . Smoking status: Current Every Day Smoker     Packs/day: 0.50     Years: 20.00     Pack years: 10.00     Types: Cigarettes     Start date: 68   . Smokeless tobacco: Never Used   Vaping Use   . Vaping Use: Never used   Substance and Sexual Activity   . Alcohol use: No   . Drug use: No   . Sexual activity: Yes     Partners: Male   Other Topics Concern   . Routine Exercise No   . Ability to Walk 2 Flight of Steps without SOB/CP Yes   Social History Narrative    Living situation: lives at home with husband, [name], and daughter.  1 dog named Duke and 1 cat named Lucky.    Nutrition:  Eats 5 small meals d/t gastric sleeve surgery.     Caffeine use: none, decaf coffee    Exercise: stays busy around house. No formal form of exercise    Seatbelt use: sometimes    Fire extinguishers in the home: yes    Smoke alarms in the home: yes    Carbon monoxide detectors in the home: yes    Nancy Fetter exposure: sunglasses        Teresita would like for husband, Rielynn Trulson,  to speak for her in the event she would become incapacitated.    Full code.        REVIEW OF SYSTEMS:    Constitutional: No fevers, chills, malaise or abnormal weight loss.   HEENT: No issues  Respiratory: See HPI  Cardiovascular: See HPI    Gastrointestinal: No abdominal pain, nausea, or vomiting  Musculoskeletal: No weakness   Hematological: No issues  Genitourinary: No  complaints  Skin: No rashes.  Neurological: Noweakness or loss of sensation  All other systems reviewed and are negative except as noted in HOPI.    PHYSICAL EXAMINATION:     Vitals reviewed. LMP  (LMP Unknown)       Vitals Filed    No data found in the last 1 encounters.         Constitutional: She is oriented to person, place, and time.  She appears well-developed and well-nourished.   Cardiovascular: Normal rate and rhythm without murmurs. No peripheral edema is noted.  Pulmonary/Chest: Lungs clear without wheezes, rales or rhonchi.    Musculoskeletal: No weakness.  Neurological: Alert and oriented x 4.    Skin: Skin is warm and dry.   Psychiatric:  Normal mood and affect.     Labs:  Lab Results   Component Value Date    HA1C 10.4 (H) 09/29/2018    LDLCHOL 96 05/08/2021    TRIG 153 (H) 05/08/2021    HDLCHOL 55 05/08/2021    CHOLESTEROL 182 05/08/2021      Lab Results   Component Value Date    SODIUM 139 05/08/2021    POTASSIUM 4.7 05/08/2021    CHLORIDE 107 05/08/2021    CO2 26 05/08/2021    BUN 17 05/08/2021    CREATININE 0.90 05/08/2021    GFR 72 05/08/2021    CALCIUM 9.9 05/08/2021       ASSESSMENT:  ENCOUNTER DIAGNOSES     ICD-10-CM   1. Coronary artery disease, unspecified vessel or lesion type, unspecified whether angina present, unspecified whether native or transplanted heart  I25.10   2. Bilateral carotid artery stenosis  I65.23   3. Dizziness  R42   4. Vision changes  H53.9   5. Hypertension, unspecified type  I10   6. Hypercholesterolemia  E78.00   7. Hx of CABG  Z95.1       PLAN:      No orders of the defined types were placed in this encounter.      No follow-ups on file.    Electronically signed by     Brandt Loosen, APRN,NP-C  06/18/2021, 07:54    This note may have been partially generated using MModal Fluency Direct system.  There may be some incorrect words, spellings, and punctuation that were not noted in checking the note before saving. Effort was made to avoid such errors.

## 2021-06-24 ENCOUNTER — Ambulatory Visit: Payer: Commercial Managed Care - PPO | Attending: Family Medicine

## 2021-06-24 ENCOUNTER — Encounter (INDEPENDENT_AMBULATORY_CARE_PROVIDER_SITE_OTHER): Payer: Self-pay | Admitting: Family Medicine

## 2021-06-24 ENCOUNTER — Ambulatory Visit (INDEPENDENT_AMBULATORY_CARE_PROVIDER_SITE_OTHER): Payer: Commercial Managed Care - PPO | Attending: Family Medicine | Admitting: Family Medicine

## 2021-06-24 ENCOUNTER — Other Ambulatory Visit: Payer: Self-pay

## 2021-06-24 VITALS — BP 116/70 | HR 81 | Resp 14 | Ht 66.0 in | Wt 207.0 lb

## 2021-06-24 DIAGNOSIS — E1165 Type 2 diabetes mellitus with hyperglycemia: Secondary | ICD-10-CM

## 2021-06-24 DIAGNOSIS — Z01818 Encounter for other preprocedural examination: Secondary | ICD-10-CM | POA: Insufficient documentation

## 2021-06-24 DIAGNOSIS — I10 Essential (primary) hypertension: Secondary | ICD-10-CM

## 2021-06-24 DIAGNOSIS — G8929 Other chronic pain: Secondary | ICD-10-CM

## 2021-06-24 DIAGNOSIS — E079 Disorder of thyroid, unspecified: Secondary | ICD-10-CM

## 2021-06-24 DIAGNOSIS — K219 Gastro-esophageal reflux disease without esophagitis: Secondary | ICD-10-CM

## 2021-06-24 DIAGNOSIS — I739 Peripheral vascular disease, unspecified: Secondary | ICD-10-CM

## 2021-06-24 DIAGNOSIS — E785 Hyperlipidemia, unspecified: Secondary | ICD-10-CM

## 2021-06-24 DIAGNOSIS — F419 Anxiety disorder, unspecified: Secondary | ICD-10-CM

## 2021-06-24 DIAGNOSIS — Z0000 Encounter for general adult medical examination without abnormal findings: Secondary | ICD-10-CM

## 2021-06-24 DIAGNOSIS — E7800 Pure hypercholesterolemia, unspecified: Secondary | ICD-10-CM

## 2021-06-24 DIAGNOSIS — M549 Dorsalgia, unspecified: Secondary | ICD-10-CM

## 2021-06-24 LAB — BASIC METABOLIC PANEL
ANION GAP: 11 mmol/L (ref 4–13)
BUN/CREA RATIO: 11 (ref 6–22)
BUN: 10 mg/dL (ref 8–25)
CALCIUM: 9.4 mg/dL (ref 8.5–10.0)
CHLORIDE: 106 mmol/L (ref 96–111)
CO2 TOTAL: 25 mmol/L (ref 22–30)
CREATININE: 0.89 mg/dL (ref 0.60–1.05)
ESTIMATED GFR: 76 mL/min/BSA (ref >=60)
GLUCOSE: 193 mg/dL — ABNORMAL HIGH (ref 65–125)
POTASSIUM: 4.5 mmol/L (ref 3.5–5.1)
SODIUM: 142 mmol/L (ref 136–145)

## 2021-06-24 LAB — HGA1C (HEMOGLOBIN A1C WITH EST AVG GLUCOSE): HEMOGLOBIN A1C: 9 % — ABNORMAL HIGH (ref <5.7)

## 2021-06-24 NOTE — Progress Notes (Signed)
[facility]  [place] [address]    Medicare Annual Wellness Visit    Name: Monique [name] MRN:  [id number]   Date: 06/24/2021 Age: 55 y.o.       SUBJECTIVE:   Monique [name] is a 55 y.o. female for presenting for Medicare Wellness exam.   I have reviewed and reconciled the medication list with the patient today.    Comprehensive Health Assessment:    Paper document Filer reviewed, signed and scanned into medical record    I have reviewed and updated as appropriate the past medical, family and social history. 06/24/2021 as summarized below:  Past Medical History:   Diagnosis Date   . Anxiety    . Carotid stenosis    . Chronic back pain    . Coronary artery disease involving native coronary artery of native heart    . DDD (degenerative disc disease), lumbar     "entire spine"   . Ear piercing    . GERD (gastroesophageal reflux disease)    . H/O complete eye exam     2 years, Dr. [name], at [facility]   . History of dental examination     dentures upper and lower   . HTN    . Hypercholesterolemia    . Hyperlipidemia    . MI (myocardial infarction) (CMS [place]) 2012   . Neck problem     herniated cervical disc   . Obesity (BMI 30-39.9)    . 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 [place] ordered.    . Stroke (CMS [facility])     2002   . Tattoo     Left breast, Right shoulder   . Thyroid disorder    . Thyroid nodule    . Uncontrolled type 2 diabetes mellitus, without long-term current use of insulin (CMS [place])    . Xanthelasma      Past Surgical History:   Procedure Laterality Date   . Endometrial ablation  1998   . Hx ankle fracture tx  1990s   . Hx cataract removal Bilateral 2013   . Hx cesarean section  06/20/2000   . Hx cholecystectomy  1988   . Hx coronary artery bypass graft  03/31/2011   . Hx coronary stent placement  2012   . 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 Hx heart catheterization  2012    . Hx thyroid biopsy     . Hx thyroidectomy  101/01/2018   . Hx tonsillectomy     . Hx yag Left 09/20/2013     Current Outpatient Medications   Medication Sig   . ACCU-CHEK AVIVA PLUS TEST STRP Strip USE 1 STRIP TO TEST BLOOD SUGAR THREE TIMES A DAY   . Blood-Glucose Meter (ACCU-CHEK AVIVA PLUS METER) Misc 1 Kit by Does not apply route Three times a day   . clopidogreL (PLAVIX) 75 mg Oral Tablet TAKE 1 TABLET BY MOUTH ONCE DAILY   . cyanocobalamin, vitamin B-12, 1,000 mcg/mL Oral Drops Take 1 mL (1,000 mcg total) by mouth Every 7 days   . diclofenac sodium (VOLTAREN) 1 % Gel Apply 2 g topically Three times a day as needed   . docusate sodium (COLACE) 100 mg Oral Capsule Take 1 Cap (100 mg total) by mouth Twice daily   . empagliflozin (JARDIANCE) 10 mg Oral Tablet Take 10 mg by mouth Once  a day   . ergocalciferol, vitamin D2, (DRISDOL) 1,250 mcg (50,000 unit) Oral Capsule TAKE 1 CAPSULE BY MOUTH EVERY WEEK   . furosemide (LASIX) 40 mg Oral Tablet TAKE 1 TABLET(40 MG) BY MOUTH EVERY DAY   . HYDROcodone-acetaminophen (NORCO) 10-325 mg Oral Tablet Take 1 Tablet by mouth Every 8 hours as needed for Pain [name] with [facility]   . levothyroxine (SYNTHROID) 25 mcg Oral Tablet Take 1 Tablet (25 mcg total) by mouth Once a day   . lisinopriL (PRINIVIL) 2.5 mg Oral Tablet TAKE 1 TABLET BY MOUTH EVERY DAY   . LORazepam (ATIVAN) 0.5 mg Oral Tablet Take 45 minutes before MRI   . metFORMIN (GLUCOPHAGE) 500 mg Oral Tablet TAKE 1 TABLET BY MOUTH THREE TIMES DAILY WITH MEALS   . metoprolol succinate (TOPROL-XL) 25 mg Oral Tablet Sustained Release 24 hr Take 0.5 Tablets (12.5 mg total) by mouth Once a day   . NARCAN 4 mg/actuation Nasal Spray, Non-Aerosol PT STATED SHE HAS NARCAN AT HOME BUT IS NOT TAKING IT   . niacin (NIASPAN) 500 mg Oral Tablet Sustained Release Take 1 Tab (500 mg total) by mouth Once a day   . nitroGLYCERIN (NITROSTAT) 0.4 mg Sublingual Tablet, Sublingual Place 1 Tablet (0.4 mg total)  under the tongue Every 5 minutes as needed for Chest pain for 3 doses over 15 minutes   . omeprazole (PRILOSEC) 40 mg Oral Capsule, Delayed Release(E.C.) TAKE 1 CAPSULE BY MOUTH EVERY DAY   . ondansetron (ZOFRAN ODT) 8 mg Oral Tablet, Rapid Dissolve DISSOLVE 1 TABLET(8 MG) ON THE TONGUE EVERY 8 HOURS AS NEEDED FOR NAUSEA OR VOMITING   . PRENATAL PLUS, CALCIUM CARB, 27 mg iron- 1 mg Oral Tablet Take 1 Tab by mouth Once a day   . rosuvastatin (CRESTOR) 40 mg Oral Tablet Take 1 Tablet (40 mg total) by mouth Once a day TAKE 1 TABLET BY MOUTH ONCE DAILY   . semaglutide (OZEMPIC) 0.25 mg or 0.5 mg(2 mg/1.5 mL) Subcutaneous Pen Injector Inject 0.5 mg under the skin Every 7 days   . venlafaxine (EFFEXOR XR) 37.5 mg Oral Capsule, Sust. Release 24 hr TAKE 1 CAPSULE BY MOUTH EVERY DAY   . ZYRTEC-D 5-120 mg Oral Tablet Sustained Release 12 hr Take 1 Tab by mouth Once a day     Family Medical History:     Problem Relation (Age of Onset)    Atrial fibrillation Sister    Colon Cancer Father    Diabetes Mother, Maternal Aunt    Heart Disease Sister, Maternal Grandfather    Hypertension (High Blood Pressure) Mother    Lung Cancer Maternal Grandfather    MI <95 years of age Mother          Social History     Socioeconomic History   . Marital status: Married     Spouse name: Monique [name]   . Number of children: 3   . Years of education: completed 11th grade   Occupational History   . Occupation: disabled   Tobacco Use   . Smoking status: Current Every Day Smoker     Packs/day: 0.50     Years: 20.00     Pack years: 10.00     Types: Cigarettes     Start date: 67   . Smokeless tobacco: Never Used   Vaping Use   . Vaping Use: Never used   Substance and Sexual Activity   . Alcohol use: No   . Drug use:  No   . Sexual activity: Yes     Partners: Male   Other Topics Concern   . Routine Exercise No   . Ability to Walk 2 Flight of Steps without SOB/CP Yes   Social History Narrative    Living situation: lives at home with husband, Monique [name], and  daughter.  1 dog named Duke and 1 cat named Lucky.    Nutrition:  Eats 5 small meals d/t gastric sleeve surgery.     Caffeine use: none, decaf coffee    Exercise: stays busy around house. No formal form of exercise    Seatbelt use: sometimes    Fire extinguishers in the home: yes    Smoke alarms in the home: yes    Carbon monoxide detectors in the home: yes    Nancy Fetter exposure: sunglasses        Cambri would like for husband, Shakiyla Kook,  to speak for her in the event she would become incapacitated.    Full code.      Review of Systems: A comprehensive review of systems was negative.     List of Current Health Care Providers   Care Team     PCP     Name Type Specialty Phone Number    Cherly Beach, MD Physician FAMILY MEDICINE [phone]          Care Team     Name Type Specialty Phone Number    Parthenia Ames, MD Not available GASTROENTEROLOGY [phone]    Rosaria Ferries., MD Not available PAIN MANAGEMENT [phone]    Paulita Cradle, MD Physician INTERVENTIONAL CARDIOLOGY  [phone]                  Health Maintenance   Topic Date Due   . Adult Tdap-Td (1 - Tdap) Never done   . Fecal DNA Test (Cologuard)  Never done   . Pneumococcal Vaccine, Age 22-64 (2 - PCV) 10/03/2011   . Pap smear  10/18/2013   . Shingles Vaccine (1 of 2) Never done   . Diabetic Retinal Exam  02/02/2020   . Covid-19 Vaccine (4 - Booster for Moderna series) 02/12/2021   . Diabetes A1C  03/31/2021   . Annual Wellness Visit  06/24/2022   . Mammography  02/07/2023   . Meningococcal Vaccine  Aged Out   . Hepatitis C screening  Discontinued   . HIV Screening  Discontinued   . Influenza Vaccine  Discontinued     Medicare Wellness Assessment   Medicare initial or wellness physical in the last year?: No  Advance Directives (optional)   Does patient have a living will or MPOA: no   Has patient provided [employer] with a copy?: no   Advance directive information given to the patient today?: no      Activities of Daily Living    Do you need help with dressing, bathing, or walking?: No   Do you need help with shopping, housekeeping, medications, or finances?: No   Do you have rugs in hallways, broken steps, or poor lighting?: No   Do you have grab bars in your bathroom, non-slip strips in your tub, and hand rails on your stairs?: Yes   Urinary Incontinence Screen (Women >=65 only)       Cognitive Function Screen (1=Yes, 0=No)   What is you age?: Correct   What is the time to the nearest hour?: Correct   What is the year?: Correct   What is the name  of this clinic?: Correct   Can the patient recognize two persons (the doctor, the nurse, home help, etc.)?: Correct   What is the date of your birth? (day and month sufficient) : Correct   In what year did World War II end?: Incorrect   Who is the current president of the [place]?: Correct   Count from 20 down to 1?: Correct   What address did I give you earlier?: Correct   Total Score: 9   Interpretation of Total Score: Greater than 6 Normal   Hearing Screen   Have you noticed any hearing difficulties?: No  After whispering 9-1-6 how many numbers did the patient repeat correctly?: 3   Fall Risk Screen   Do you feel unsteady when standing or walking?: No  Do you worry about falling?: No  Have you fallen in the past year?: No   Vision Screen   Right Eye = 20: 25   Left Eye = 20: 25   Depression Screen   Little interest or pleasure in doing things.: Not at all  Feeling down, depressed, or hopeless: Not at all  PHQ 2 Total: 0        OBJECTIVE:     BP 116/70   Pulse 81   Resp 14   Ht 1.676 m (5' 6" )   Wt 93.9 kg (207 lb)   LMP  (LMP Unknown)   SpO2 99%   BMI 33.41 kg/m     Physical Examination:    GENERAL:   Pt is a pleasant, well-nourished, well-developed 55 y.o. female who is in NAD. Appears stated age  73:  head normocephalic, symmetrical facies. EOM intact b/l. PERRLA. Sclera non-icteric, non-injected. upper eyelid w/Xythoma-lipid plaques, again noted to be diminished in  fullness and intensity. Oropharyngeal mucous membranes are moist  w/o erythema/exudates   NECK:   No masses, no lymphadenopathy, no JVD on exam. Trachea midline, no thyromegaly   CV: S1, S2. No murmurs, rubs, gallops.     LUNGS: CTAB, No rhonchi, rales, wheezes.   GI: (+) BS in all 4 quadrants. soft, NT/ND. No rigidity/positive for guarding/negative for rebound. No organomegaly/masses, or abdominal bruits,   MSK: Spontaneous normal AROM of major joints as observed during exam. No joint effusions/ swelling/ deformities.   No BLE edema, clubbing, or cyanosis.   2+ radial pulse present b/l.   + 2 reflexes Brachioradialis and patellar bilaterally.   Gait normal.  SKIN: No significant lesions, rashes, ecchymoses noted.  She has benign lesions on her forehead and the right lateral canthus. She would like these excised.  NEURO: AAOx4, CN grossly intact. Sensation intact b/l. No focal deficits.  PSYCH: Mood, behavior and affect normal w/ Intact judgement & insight  Exam not significantly changed.     Health Maintenance Due   Topic Date Due   . Adult Tdap-Td (1 - Tdap) Never done   . Fecal DNA Test (Cologuard)  Never done   . Pneumococcal Vaccine, Age 53-64 (2 - PCV) 10/03/2011   . Pap smear  10/18/2013   . Shingles Vaccine (1 of 2) Never done   . Diabetic Retinal Exam  02/02/2020   . Covid-19 Vaccine (4 - Booster for Moderna series) 02/12/2021   . Diabetes A1C  03/31/2021      ASSESSMENT & PLAN:   1. Encounter for Medicare annual wellness exam    2. Anxiety    3. Chronic back pain, unspecified back location, unspecified back pain laterality    4. Type  2 diabetes mellitus with hyperglycemia (CMS [place])    5. Gastroesophageal reflux disease, unspecified whether esophagitis present    6. Hypertension, unspecified type    7. Hypercholesterolemia    8. Hyperlipidemia, unspecified hyperlipidemia type    9. Peripheral vascular disease, unspecified (CMS [name])    10. Thyroid disorder       Identified Risk Factors/ Recommended Actions        The PHQ 2 Total: 0 depression screen is interpreted as negative.    Orders Placed This Encounter   . HGA1C (HEMOGLOBIN A1C WITH EST AVG GLUCOSE)          The patient has been educated about risk factors and recommended preventive care. Written Prevention Plan completed/ updated and given to patient (see After Visit Summary).    Return in about 6 months (around 12/25/2021), or if symptoms worsen or fail to improve, for In Person Visit.     GI upset:  Patient attributes to her Ozempic.    She is curious about turning to Trulicity.  Cost is a barrier.  Recommend she reduce her Ozempic to the starting dose.  She describes mucous discharge/diarrhea.  Or about small-bowel obstruction.  She has no positive findings on exam.  Consider KUB.  She will call if not better in the next week.  She will call 911 for syncope.    Return in 3 months for chronic disease medical management    Cherly Beach, MD  06/24/2021, 10:53    [facility]IC[facility], [facility] CARE  [place] [address]  Phone: [phone]  Fax: [phone]     This note was partially generated using MModal Fluency Direct system, and there may be some incorrect words, spellings, and punctuation that were not noted in checking the note before saving.

## 2021-06-24 NOTE — Nursing Note (Signed)
06/24/21 1000   Medicare Wellness Assessment   Medicare initial or wellness physical in the last year? No   Advance Directives   Does patient have a living will or MPOA no   Has patient provided [employer] with a copy? no   Advance directive information given to the patient today? no   Activities of Daily Living   Do you need help with dressing, bathing, or walking? No   Do you need help with shopping, housekeeping, medications, or finances? No   Do you have rugs in hallways, broken steps, or poor lighting? No   Do you have grab bars in your bathroom, non-slip strips in your tub, and hand rails on your stairs? Yes   Cognitive Function Screen   What is you age? 1   What is the time to the nearest hour? 1   What is the year? 1   What is the name of this clinic? 1   Can the patient recognize two persons (the doctor, the nurse, home help, etc.)? 1   What is the date of your birth? (day and month sufficient)  1   In what year did World War II end? 0   Who is the current president of the [place]? 1   Count from 20 down to 1? 1   What address did I give you earlier? 1   Total Score 9   Interpretation of Total Score Greater than 6 Normal   Depression Screen   Little interest or pleasure in doing things. 0   Feeling down, depressed, or hopeless 0   PHQ 2 Total 0   Hearing Screen   Have you noticed any hearing difficulties? No   After whispering 9-1-6 how many numbers did the patient repeat correctly? 3   Fall Risk Assessment   Do you feel unsteady when standing or walking? No   Do you worry about falling? No   Have you fallen in the past year? No   Vision Screen   Right Eye = 20 25   Left Eye = 20 25

## 2021-06-24 NOTE — Patient Instructions (Signed)
Medicare Preventive Services  Medicare coverage information Recommendation for YOU   Heart Disease and Diabetes   Lipid profile Every 5 years or more often if at risk for cardiovascular disease  Last Lipid Panel  (Last result in the past 2 years)      Cholesterol   HDL   LDL   Direct LDL   Triglycerides      05/08/21 1225 182   55   96     153           Diabetes Screening  yearly for those at risk for diabetes, 2 tests per year for those with prediabetes Last Glucose: 177    Diabetes Self Management Training or Medical Nutrition Therapy  For those with diabetes, up to 10 hrs initial training within a year, subsequent years up to 2 hrs of follow up training Optional for those with diabetes     Medical Nutrition Therapy Three hours of one-on-one counseling in first year, two hours in subsequent years Optional for those with diabetes, kidney disease   Intensive Behavioral Therapy for Obesity  Face-to-face counseling, first month every week, month 2-6 every other week, month 7-12 every month if continued progress is documented Optional for those with Body Mass Index 30 or higher  Your Body mass index is 33.41 kg/m.   Tobacco Cessation (Quitting) Counseling   Two attempts per year, max 4 sessions per attempt, up to 8 per year, for those with tobacco-related health condition Optional for those that use tobacco   Cancer Screening   Colorectal screening   For anyone age 50 to 14 or any age if high risk:  . Screening Colonoscopy every 10 yrs if low risk,  more frequent if higher risk  OR  . Cologuard Stool DNA test once every 3 years OR  . Fecal Occult Blood Testing yearly OR  . Flexible  Sigmoidoscopy  every 5 yr OR  . CT Colonography every 5 yrs    See your schedule below   Screening Pap Test Recommended every 3 years for all women age 68 to 12, or every five years if combined with HPV test (routine screening not needed after total hysterectomy).  Medicare covers every 2 years, up to yearly if high risk.  Screening  Pelvic Exam Medicare covers every 2 years, yearly if high risk or childbearing age with abnormal Pap in last 3 yrs. See your schedule below   Screening Mammogram   Recommended every 2 years for women age 67 to 85, or more frequent if you have a higher risk. Selectively recommended for women between 40-49 based on shared decisions about risk. Covered by Medicare up to every year for women age 43 or older See your schedule below   Lung Cancer Screening  Annual low dose computed tomography (LDCT scan) is recommended for those age 89-77 who smoked 20 pack-years and are current smokers or quit smoking within past 15 years (one pack-year= smoking one PPD for one year), after counseling by your doctor or nurse clinician about the possible benefits or harms. See your schedule below   Vaccinations   Pneumococcal Vaccine: Recommended routinely age 34+ with one or two separate vaccines based on your risk    Recommended before age 38 if medical conditions with increased risk  Seasonal Influenza Vaccine: Once every flu season   Hepatitis B Vaccine: 3 doses if risk (including anyone with diabetes or liver disease)  Shingles Vaccine: Two doses at age 50 or older  Diphtheria Tetanus  Pertussis Vaccine: ONCE as adult, booster every 10 years     Immunization History   Administered Date(s) Administered   . Covid-19 Vaccine,Moderna,12 Years+ 02/09/2020, 03/08/2020, 10/15/2020   . Influenza Vaccine, 6 month-adult 08/08/2010, 10/13/2012, 08/06/2017, 09/16/2018, 08/31/2019, 10/02/2020   . Pneumovax 10/02/2010     Shingles vaccine and Diphtheria Tetanus Pertussis vaccines are available at pharmacies or local health department without a prescription.   Other Screening   Bone Densitometry   Every 24 months for anyone at risk, including postmenopausal       Glaucoma Screening   Yearly if in high risk group such as diabetes, family history, African American age 64+ or Hispanic American age 77+      Hepatitis C Screening recommended ONCE for  those born between 1945-1965, or high risk for HCV infection       HIV Testing recommended routinely at least ONCE, covered every year for age 56 to 74 regardless of risk, and every year for age over 79 who ask for the test or higher risk  Yearly or up to 3 times in pregnancy         Abdominal Aortic Aneurysm Screening Ultrasound   Once between the age of 35-75 with a family history of AAA       Your Personalized Schedule for Preventive Tests   Health Maintenance: Pending and Last Completed       Date Due Completion Date    Adult Tdap-Td (1 - Tdap) Never done ---    Fecal DNA Test (Cologuard) Never done ---    Pneumococcal Vaccine, Age 54-64 (2 - PCV) 10/03/2011 10/02/2010    Pap smear 10/18/2013 10/18/2010    Shingles Vaccine (1 of 2) Never done ---    Diabetic Retinal Exam 02/02/2020 02/01/2019    Covid-19 Vaccine (4 - Booster for Moderna series) 02/12/2021 10/15/2020    Diabetes A1C 03/31/2021 12/31/2020    Annual Wellness Visit 06/24/2022 06/24/2021    Mammography 02/07/2023 02/06/2021

## 2021-06-25 ENCOUNTER — Other Ambulatory Visit (INDEPENDENT_AMBULATORY_CARE_PROVIDER_SITE_OTHER): Payer: Commercial Managed Care - PPO

## 2021-06-25 ENCOUNTER — Telehealth (INDEPENDENT_AMBULATORY_CARE_PROVIDER_SITE_OTHER): Payer: Self-pay | Admitting: Family Medicine

## 2021-06-25 NOTE — Telephone Encounter (Signed)
Per Jama Flavors, wait til after the MRI to take the Hydrocodone. I called the patient back and left a detailed message with these instructions. Ramces Shomaker D. York Cerise, RN  06/25/2021, 16:19

## 2021-06-25 NOTE — Telephone Encounter (Signed)
Scheduled for MRI tomorrow and has ativan to take prior.  Also takes Hyrdocodone 10/325. Questions if ok to take that as well or if needing to hold?

## 2021-06-26 ENCOUNTER — Other Ambulatory Visit: Payer: Self-pay

## 2021-06-26 ENCOUNTER — Other Ambulatory Visit (INDEPENDENT_AMBULATORY_CARE_PROVIDER_SITE_OTHER): Payer: Commercial Managed Care - PPO

## 2021-06-26 DIAGNOSIS — R9089 Other abnormal findings on diagnostic imaging of central nervous system: Secondary | ICD-10-CM

## 2021-06-26 DIAGNOSIS — I639 Cerebral infarction, unspecified: Secondary | ICD-10-CM

## 2021-06-28 NOTE — Result Encounter Note (Signed)
Please inform the patient that her labs are back.    Her metabolic panel showed her electrolytes were well controlled, her kidney function was very good.    Her hemoglobin A1c is 9.0%.  This is down.  Encourage continued compliance with diabetic meds and diet.    A making no changes to her plan of care, She and I will review these results together at our next appointment.    TY TH

## 2021-07-03 ENCOUNTER — Encounter (INDEPENDENT_AMBULATORY_CARE_PROVIDER_SITE_OTHER): Payer: Self-pay | Admitting: Family Medicine

## 2021-07-12 ENCOUNTER — Encounter (INDEPENDENT_AMBULATORY_CARE_PROVIDER_SITE_OTHER): Payer: Self-pay

## 2021-07-12 ENCOUNTER — Ambulatory Visit (INDEPENDENT_AMBULATORY_CARE_PROVIDER_SITE_OTHER): Payer: Commercial Managed Care - PPO | Admitting: NURSE PRACTITIONER

## 2021-07-12 ENCOUNTER — Other Ambulatory Visit: Payer: Commercial Managed Care - PPO | Attending: NURSE PRACTITIONER | Admitting: NURSE PRACTITIONER

## 2021-07-12 ENCOUNTER — Other Ambulatory Visit: Payer: Self-pay

## 2021-07-12 VITALS — BP 108/64 | HR 78 | Temp 97.9°F | Resp 18 | Ht 66.0 in | Wt 215.0 lb

## 2021-07-12 DIAGNOSIS — R5383 Other fatigue: Secondary | ICD-10-CM

## 2021-07-12 DIAGNOSIS — R519 Headache, unspecified: Secondary | ICD-10-CM | POA: Insufficient documentation

## 2021-07-12 DIAGNOSIS — H9203 Otalgia, bilateral: Secondary | ICD-10-CM

## 2021-07-12 DIAGNOSIS — J988 Other specified respiratory disorders: Secondary | ICD-10-CM | POA: Insufficient documentation

## 2021-07-12 DIAGNOSIS — Z20822 Contact with and (suspected) exposure to covid-19: Secondary | ICD-10-CM | POA: Insufficient documentation

## 2021-07-12 DIAGNOSIS — J0100 Acute maxillary sinusitis, unspecified: Secondary | ICD-10-CM | POA: Insufficient documentation

## 2021-07-12 DIAGNOSIS — F17210 Nicotine dependence, cigarettes, uncomplicated: Secondary | ICD-10-CM

## 2021-07-12 LAB — COVID-19 ~~LOC~~ MOLECULAR LAB TESTING: SARS-COV-2: NOT DETECTED

## 2021-07-12 MED ORDER — AMOXICILLIN 500 MG-POTASSIUM CLAVULANATE 125 MG TABLET
1.0000 | ORAL_TABLET | Freq: Three times a day (TID) | ORAL | 0 refills | Status: AC
Start: 2021-07-12 — End: 2021-07-22

## 2021-07-12 NOTE — Progress Notes (Signed)
[address] [facility]  [address]  [place] [address]    Progress Note    Name: Alisi Lupien MRN:  [id number]   Date: 07/12/2021 Age: 55 y.o.         Reason for Visit: Headache (Frontal sinus headache), Nasal Congestion, Sneezing, Fatigue, and Ear Pain (Bilateral ear ache)    Nursing Notes:  There are no exam notes on file for this visit.    History of Present Illness  Hanne Kegg is a 55 y.o. female who is being seen today for above symptoms and bilateral maxillary sinus pain with post nasal drainage and frontal sinus headache. Symptoms began 07/10/2021 and have remained consistent in severity.     Past Medical History:   Diagnosis Date   . Anxiety    . Carotid stenosis    . Chronic back pain    . Coronary artery disease involving native coronary artery of native heart    . DDD (degenerative disc disease), lumbar     "entire spine"   . Ear piercing    . GERD (gastroesophageal reflux disease)    . H/O complete eye exam     2 years, Dr. [name], at [facility]   . History of dental examination     dentures upper and lower   . HTN    . Hypercholesterolemia    . Hyperlipidemia    . MI (myocardial infarction) (CMS [place]) 2012   . Neck problem     herniated cervical disc   . Obesity (BMI 30-39.9)    . 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 [place] ordered.    . Stroke (CMS [facility])     2002   . Tattoo     Left breast, Right shoulder   . Thyroid disorder    . Thyroid nodule    . Uncontrolled type 2 diabetes mellitus, without long-term current use of insulin (CMS [place])    . Xanthelasma          Past Surgical History:   Procedure Laterality Date   . ENDOMETRIAL ABLATION  1998   . HX ANKLE FRACTURE TX  1990s    Right, Doran Durand procedure   . HX CATARACT REMOVAL Bilateral 2013    r and l eyes with implants   . HX CESAREAN SECTION  06/20/2000    x3, 11/21/86, 11/10/84   . HX CHOLECYSTECTOMY  1988   . HX CORONARY ARTERY BYPASS GRAFT  03/31/2011     cardiac, 2 vessel, Dr. [name], [facility]   . HX CORONARY STENT PLACEMENT  2012    x4 stents   . HX GASTRIC SLEEVE  02/2017    [place], [place], Dr. Pearlie Oyster   . HX HEART CATHETERIZATION  2012    massive heart attack and stents - [facility]   . HX THYROID BIOPSY     . HX THYROIDECTOMY  101/01/2018   . HX TONSILLECTOMY      as child   . HX YAG Left 09/20/2013         Current Outpatient Medications   Medication Sig   . ACCU-CHEK AVIVA PLUS TEST STRP Strip USE 1 STRIP TO TEST BLOOD SUGAR THREE TIMES A DAY   . amoxicillin-pot clavulanate (AUGMENTIN) 500-125 mg Oral Tablet Take 1 Tablet by mouth Three times a day for 10 days   . Blood-Glucose Meter (ACCU-CHEK AVIVA PLUS METER) Misc 1 Kit by Does not  apply route Three times a day   . clopidogreL (PLAVIX) 75 mg Oral Tablet TAKE 1 TABLET BY MOUTH ONCE DAILY   . cyanocobalamin, vitamin B-12, 1,000 mcg/mL Oral Drops Take 1 mL (1,000 mcg total) by mouth Every 7 days   . diclofenac sodium (VOLTAREN) 1 % Gel Apply 2 g topically Three times a day as needed   . docusate sodium (COLACE) 100 mg Oral Capsule Take 1 Cap (100 mg total) by mouth Twice daily   . empagliflozin (JARDIANCE) 10 mg Oral Tablet Take 10 mg by mouth Once a day (Patient not taking: Reported on 07/12/2021)   . ergocalciferol, vitamin D2, (DRISDOL) 1,250 mcg (50,000 unit) Oral Capsule TAKE 1 CAPSULE BY MOUTH EVERY WEEK   . furosemide (LASIX) 40 mg Oral Tablet TAKE 1 TABLET(40 MG) BY MOUTH EVERY DAY   . HYDROcodone-acetaminophen (NORCO) 10-325 mg Oral Tablet Take 1 Tablet by mouth Every 8 hours as needed for Pain [name] with [facility]   . levothyroxine (SYNTHROID) 25 mcg Oral Tablet Take 1 Tablet (25 mcg total) by mouth Once a day   . lisinopriL (PRINIVIL) 2.5 mg Oral Tablet TAKE 1 TABLET BY MOUTH EVERY DAY (Patient not taking: Reported on 07/12/2021)   . LORazepam (ATIVAN) 0.5 mg Oral Tablet Take 45 minutes before MRI   . metFORMIN (GLUCOPHAGE) 500 mg Oral Tablet TAKE 1 TABLET BY MOUTH THREE TIMES  DAILY WITH MEALS   . metoprolol succinate (TOPROL-XL) 25 mg Oral Tablet Sustained Release 24 hr Take 0.5 Tablets (12.5 mg total) by mouth Once a day   . NARCAN 4 mg/actuation Nasal Spray, Non-Aerosol PT STATED SHE HAS NARCAN AT HOME BUT IS NOT TAKING IT   . niacin (NIASPAN) 500 mg Oral Tablet Sustained Release Take 1 Tab (500 mg total) by mouth Once a day   . nitroGLYCERIN (NITROSTAT) 0.4 mg Sublingual Tablet, Sublingual Place 1 Tablet (0.4 mg total) under the tongue Every 5 minutes as needed for Chest pain for 3 doses over 15 minutes   . omeprazole (PRILOSEC) 40 mg Oral Capsule, Delayed Release(E.C.) TAKE 1 CAPSULE BY MOUTH EVERY DAY   . ondansetron (ZOFRAN ODT) 8 mg Oral Tablet, Rapid Dissolve DISSOLVE 1 TABLET(8 MG) ON THE TONGUE EVERY 8 HOURS AS NEEDED FOR NAUSEA OR VOMITING   . PRENATAL PLUS, CALCIUM CARB, 27 mg iron- 1 mg Oral Tablet Take 1 Tab by mouth Once a day   . rosuvastatin (CRESTOR) 40 mg Oral Tablet Take 1 Tablet (40 mg total) by mouth Once a day TAKE 1 TABLET BY MOUTH ONCE DAILY   . semaglutide (OZEMPIC) 0.25 mg or 0.5 mg(2 mg/1.5 mL) Subcutaneous Pen Injector Inject 0.5 mg under the skin Every 7 days   . venlafaxine (EFFEXOR XR) 37.5 mg Oral Capsule, Sust. Release 24 hr TAKE 1 CAPSULE BY MOUTH EVERY DAY   . ZYRTEC-D 5-120 mg Oral Tablet Sustained Release 12 hr Take 1 Tab by mouth Once a day     Allergies   Allergen Reactions   . Ane Payment [Pyrilamine-Dextromethorphan]  Other Adverse Reaction (Add comment)     LIGHT HEADED   . Tylenol W Codeine [Acetaminophen-Codeine]  Other Adverse Reaction (Add comment)     pt states that one time she had chest pains with this med, but dr. thought it was because she had not eaten     Family Medical History:     Problem Relation (Age of Onset)    Atrial fibrillation Sister    Colon Cancer  Father    Diabetes Mother, Maternal Aunt    Heart Disease Sister, Maternal Grandfather    Hypertension (High Blood Pressure) Mother    Lung Cancer Maternal Grandfather    MI <21  years of age Mother          Social History     Tobacco Use   . Smoking status: Current Every Day Smoker     Packs/day: 0.50     Years: 20.00     Pack years: 10.00     Types: Cigarettes     Start date: 67   . Smokeless tobacco: Never Used   Vaping Use   . Vaping Use: Never used   Substance Use Topics   . Alcohol use: No   . Drug use: No       Review of Systems  Review of Systems   Constitutional: Positive for malaise/fatigue.   HENT: Positive for congestion, ear pain and sinus pain.    Respiratory: Positive for cough. Negative for sputum production and shortness of breath.    Cardiovascular: Negative for chest pain.   Neurological: Positive for headaches.       Physical Exam:  BP 108/64   Pulse 78   Temp 36.6 C (97.9 F)   Resp 18   Ht 1.676 m (5' 6" )   Wt 97.5 kg (215 lb)   LMP  (LMP Unknown)   SpO2 96%   BMI 34.70 kg/m       Physical Exam  HENT:      Right Ear: Tympanic membrane is erythematous. Tympanic membrane is not bulging.      Left Ear: Tympanic membrane is erythematous. Tympanic membrane is not bulging.      Nose: Congestion present.      Right Sinus: Maxillary sinus tenderness present.      Left Sinus: Maxillary sinus tenderness present.      Mouth/Throat:      Pharynx: No posterior oropharyngeal erythema.      Comments: Mucopurulent post nasal drainage present  Cardiovascular:      Rate and Rhythm: Regular rhythm.   Pulmonary:      Effort: Pulmonary effort is normal.      Breath sounds: Normal breath sounds.   Neurological:      Mental Status: She is alert.         Assessment:    Fatigue, unspecified type    Headache    Respiratory infection    Acute non-recurrent maxillary sinusitis    Acute otalgia, bilateral         Plan:  Will screen for COVID-19  Patient will not qualify for Paxlovid if positive due to drug interactions. I discussed Molnupiravir with her and explained it is an investigational medication with emergency authorization.  Patient Fact Sheet was provided for patient to read  this pm.  Monitor for fever  Avoid respiratory irritants/allergens when possible   Notify clinic if symptoms not improving next 3 days  Infection control measures discussed  Adequate rest    Orders Placed This Encounter   . COVID-19 SCREENING - OUTPATIENT AND DRIVE UP TESTING   . amoxicillin-pot clavulanate (AUGMENTIN) 500-125 mg Oral Tablet        MDM:       During the patient's visit at urgent care patient's chart was reviewed; HPI, ROS and physical exam were completed to assist in medical decision making.     Medications were reviewed and reconciled. Medication instructions and side effects were discussed.  Advised to seek immediate medical care with any new, worsening, or concerning symptoms.   Opportunity to ask questions was provided and all questions were answered.   Discussed diagnosis and management including indications for return, importance of close follow up and supportive care measures prior to patient discharge.       Follow up: Follow up with PCP if symptoms persist.       Thomasenia Bottoms, FNP-BC  07/12/2021, 18:32

## 2021-08-02 ENCOUNTER — Other Ambulatory Visit: Payer: Self-pay

## 2021-08-05 ENCOUNTER — Encounter (INDEPENDENT_AMBULATORY_CARE_PROVIDER_SITE_OTHER): Payer: Self-pay | Admitting: PSYCHIATRY AND NEUROLOGY-NEUROLOGY

## 2021-08-15 ENCOUNTER — Ambulatory Visit (HOSPITAL_BASED_OUTPATIENT_CLINIC_OR_DEPARTMENT_OTHER): Payer: Self-pay | Admitting: NURSE PRACTITIONER

## 2021-08-15 NOTE — Progress Notes (Deleted)
[facility]  OB/GYN, MEDICAL OFFICE BUILDING B  [place] [address]  [phone]         ANNUAL VISIT    PATIENT: Monique [name]  CHART NUMBER: [id number]  DATE OF SERVICE: 08/15/2021    No chief complaint on file.        HPI: Monique [name] is a 55 y.o. year old who presents to the office today for her annual exam. No LMP recorded (lmp unknown). Patient is postmenopausal.      PAST MEDICAL HISTORY     Past Medical History:   Diagnosis Date   . Anxiety    . Carotid stenosis    . Chronic back pain    . Coronary artery disease involving native coronary artery of native heart    . DDD (degenerative disc disease), lumbar     "entire spine"   . Ear piercing    . GERD (gastroesophageal reflux disease)    . H/O complete eye exam     2 years, Dr. [name], at [facility]   . History of dental examination     dentures upper and lower   . HTN    . Hypercholesterolemia    . Hyperlipidemia    . MI (myocardial infarction) (CMS [place]) 2012   . Neck problem     herniated cervical disc   . Obesity (BMI 30-39.9)    . 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 [place] ordered.    . Stroke (CMS [facility])     2002   . Tattoo     Left breast, Right shoulder   . Thyroid disorder    . Thyroid nodule    . Uncontrolled type 2 diabetes mellitus, without long-term current use of insulin (CMS [place])    . Xanthelasma      PAST SURGICAL HISTORY     Past Surgical History:   Procedure Laterality Date   . ENDOMETRIAL ABLATION  1998   . HX ANKLE FRACTURE TX  1990s    Right, Doran Durand procedure   . HX CATARACT REMOVAL Bilateral 2013    r and l eyes with implants   . HX CESAREAN SECTION  06/20/2000    x3, 11/21/86, 11/10/84   . HX CHOLECYSTECTOMY  1988   . HX CORONARY ARTERY BYPASS GRAFT  03/31/2011    cardiac, 2 vessel, Dr. [name], [facility]   . HX CORONARY STENT PLACEMENT  2012    x4 stents   . HX GASTRIC SLEEVE  02/2017    [place], [place], Dr. Pearlie Oyster   . HX  HEART CATHETERIZATION  2012    [facility] stents - [facility]   . HX THYROID BIOPSY     . HX THYROIDECTOMY  101/01/2018   . HX TONSILLECTOMY      as child   . HX YAG Left 09/20/2013     OB HISTORY     OB History   No obstetric history on file.         MENSTRUAL HISTORY   Menstrual Tracking            SOCIAL HISTORY     Social History     Socioeconomic History   . Marital status: Married     Spouse name: [name]   . Number of children: 3   . Years of education: completed 11th grade   Occupational History   .  Occupation: disabled   Tobacco Use   . Smoking status: Current Every Day Smoker     Packs/day: 0.50     Years: 20.00     Pack years: 10.00     Types: Cigarettes     Start date: 2   . Smokeless tobacco: Never Used   Vaping Use   . Vaping Use: Never used   Substance and Sexual Activity   . Alcohol use: No   . Drug use: No   . Sexual activity: Yes     Partners: Male   Other Topics Concern   . Routine Exercise No   . Ability to Walk 2 Flight of Steps without SOB/CP Yes   Social History Narrative    Living situation: lives at home with husband, [name], and daughter.  1 dog named Duke and 1 cat named Lucky.    Nutrition:  Eats 5 small meals d/t gastric sleeve surgery.     Caffeine use: none, decaf coffee    Exercise: stays busy around house. No formal form of exercise    Seatbelt use: sometimes    Fire extinguishers in the home: yes    Smoke alarms in the home: yes    Carbon monoxide detectors in the home: yes    Nancy Fetter exposure: sunglasses        Malania would like for husband, Evella Kasal,  to speak for her in the event she would become incapacitated.    Full code.       FAMILY HISTORY     Family Medical History:     Problem Relation (Age of Onset)    Atrial fibrillation Sister    Colon Cancer Father    Diabetes Mother, Maternal Aunt    Heart Disease Sister, Maternal Grandfather    Hypertension (High Blood Pressure) Mother    Lung Cancer Maternal Grandfather    MI <42 years of age Mother          CURRENT  MEDICATIONS      Current Outpatient Medications   Medication Sig   . ACCU-CHEK AVIVA PLUS TEST STRP Strip USE 1 STRIP TO TEST BLOOD SUGAR THREE TIMES A DAY   . Blood-Glucose Meter (ACCU-CHEK AVIVA PLUS METER) Misc 1 Kit by Does not apply route Three times a day   . clopidogreL (PLAVIX) 75 mg Oral Tablet TAKE 1 TABLET BY MOUTH ONCE DAILY   . cyanocobalamin, vitamin B-12, 1,000 mcg/mL Oral Drops Take 1 mL (1,000 mcg total) by mouth Every 7 days   . diclofenac sodium (VOLTAREN) 1 % Gel Apply 2 g topically Three times a day as needed   . docusate sodium (COLACE) 100 mg Oral Capsule Take 1 Cap (100 mg total) by mouth Twice daily   . empagliflozin (JARDIANCE) 10 mg Oral Tablet Take 10 mg by mouth Once a day (Patient not taking: Reported on 07/12/2021)   . ergocalciferol, vitamin D2, (DRISDOL) 1,250 mcg (50,000 unit) Oral Capsule TAKE 1 CAPSULE BY MOUTH EVERY WEEK   . furosemide (LASIX) 40 mg Oral Tablet TAKE 1 TABLET(40 MG) BY MOUTH EVERY DAY   . HYDROcodone-acetaminophen (NORCO) 10-325 mg Oral Tablet Take 1 Tablet by mouth Every 8 hours as needed for Pain [name] with [facility]   . levothyroxine (SYNTHROID) 25 mcg Oral Tablet Take 1 Tablet (25 mcg total) by mouth Once a day   . lisinopriL (PRINIVIL) 2.5 mg Oral Tablet TAKE 1 TABLET BY MOUTH EVERY DAY (Patient not taking: Reported on 07/12/2021)   .  LORazepam (ATIVAN) 0.5 mg Oral Tablet Take 45 minutes before MRI   . metFORMIN (GLUCOPHAGE) 500 mg Oral Tablet TAKE 1 TABLET BY MOUTH THREE TIMES DAILY WITH MEALS   . metoprolol succinate (TOPROL-XL) 25 mg Oral Tablet Sustained Release 24 hr Take 0.5 Tablets (12.5 mg total) by mouth Once a day   . NARCAN 4 mg/actuation Nasal Spray, Non-Aerosol PT STATED SHE HAS NARCAN AT HOME BUT IS NOT TAKING IT   . niacin (NIASPAN) 500 mg Oral Tablet Sustained Release Take 1 Tab (500 mg total) by mouth Once a day   . nitroGLYCERIN (NITROSTAT) 0.4 mg Sublingual Tablet, Sublingual Place 1 Tablet (0.4 mg total) under the  tongue Every 5 minutes as needed for Chest pain for 3 doses over 15 minutes   . omeprazole (PRILOSEC) 40 mg Oral Capsule, Delayed Release(E.C.) TAKE 1 CAPSULE BY MOUTH EVERY DAY   . ondansetron (ZOFRAN ODT) 8 mg Oral Tablet, Rapid Dissolve DISSOLVE 1 TABLET(8 MG) ON THE TONGUE EVERY 8 HOURS AS NEEDED FOR NAUSEA OR VOMITING   . PRENATAL PLUS, CALCIUM CARB, 27 mg iron- 1 mg Oral Tablet Take 1 Tab by mouth Once a day   . rosuvastatin (CRESTOR) 40 mg Oral Tablet Take 1 Tablet (40 mg total) by mouth Once a day TAKE 1 TABLET BY MOUTH ONCE DAILY   . semaglutide (OZEMPIC) 0.25 mg or 0.5 mg(2 mg/1.5 mL) Subcutaneous Pen Injector Inject 0.5 mg under the skin Every 7 days   . venlafaxine (EFFEXOR XR) 37.5 mg Oral Capsule, Sust. Release 24 hr TAKE 1 CAPSULE BY MOUTH EVERY DAY   . ZYRTEC-D 5-120 mg Oral Tablet Sustained Release 12 hr Take 1 Tab by mouth Once a day         ALLERGIES   Capron dm [pyrilamine-dextromethorphan] and Tylenol w codeine [acetaminophen-codeine]       REVIEW OF SYSTEMS    Review of Systems       PHYSICAL EXAMINATION   There were no vitals filed for this visit.      OBGyn Exam       LAB/DIAGNOSTIC RESULTS   There are no exam notes on file for this visit.    ***      ASSESSMENT/PLAN   No diagnosis found.      EDUCATION       FOLLOW-UP   No follow-ups on file.         Warren Lacy, APRN,FNP-BC

## 2021-08-23 ENCOUNTER — Encounter (INDEPENDENT_AMBULATORY_CARE_PROVIDER_SITE_OTHER): Payer: Self-pay | Admitting: NURSE PRACTITIONER

## 2021-08-23 ENCOUNTER — Ambulatory Visit (INDEPENDENT_AMBULATORY_CARE_PROVIDER_SITE_OTHER): Payer: Commercial Managed Care - PPO | Admitting: NURSE PRACTITIONER

## 2021-08-23 ENCOUNTER — Other Ambulatory Visit: Payer: Commercial Managed Care - PPO | Attending: NURSE PRACTITIONER | Admitting: NURSE PRACTITIONER

## 2021-08-23 ENCOUNTER — Other Ambulatory Visit: Payer: Self-pay

## 2021-08-23 VITALS — BP 118/70 | HR 77 | Temp 97.0°F | Resp 16 | Ht 66.0 in | Wt 208.0 lb

## 2021-08-23 DIAGNOSIS — R519 Headache, unspecified: Secondary | ICD-10-CM

## 2021-08-23 DIAGNOSIS — Z20822 Contact with and (suspected) exposure to covid-19: Secondary | ICD-10-CM

## 2021-08-23 DIAGNOSIS — U071 COVID-19: Secondary | ICD-10-CM | POA: Insufficient documentation

## 2021-08-23 NOTE — Progress Notes (Signed)
[address] [facility]  [address]  [place] [address]    Progress Note    Name: Monique [name] MRN:  [id number]   Date: 08/23/2021 Age: 55 y.o.            Chief complaint:   Headache and Exposure To Coronavirus.    HPI:  The patient is a 55 y.o. old female who came in today for Covid exposure. Patient states she was exposed to Covid on Tuesday and her husband had a home positive test today. States she began having a headache today. Denies any other symptoms.     ROS:  Review of Systems   Constitutional: Negative for chills, fatigue and fever.   HENT: Negative for congestion, ear pain, rhinorrhea, sinus pressure, sneezing and sore throat.    Respiratory: Negative for cough, chest tightness, shortness of breath and wheezing.    Cardiovascular: Negative for chest pain and palpitations.   Gastrointestinal: Negative for abdominal pain, diarrhea, nausea and vomiting.   Musculoskeletal: Negative for myalgias.   Skin: Negative for rash.   Neurological: Positive for headaches. Negative for dizziness, syncope, weakness and light-headedness.          Past medical history:  Past Medical History:   Diagnosis Date   . Anxiety    . Carotid stenosis    . Chronic back pain    . Coronary artery disease involving native coronary artery of native heart    . DDD (degenerative disc disease), lumbar     "entire spine"   . Ear piercing    . GERD (gastroesophageal reflux disease)    . H/O complete eye exam     2 years, Dr. [name], at [facility]   . History of dental examination     dentures upper and lower   . HTN    . Hypercholesterolemia    . Hyperlipidemia    . MI (myocardial infarction) (CMS [place]) 2012   . Neck problem     herniated cervical disc   . Obesity (BMI 30-39.9)    . 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 [place] ordered.    . Stroke (CMS [facility])     2002   . Tattoo     Left breast, Right shoulder   . Thyroid disorder    . Thyroid nodule    . Uncontrolled  type 2 diabetes mellitus, without long-term current use of insulin (CMS [place])    . Xanthelasma          Past surgical history:  Past Surgical History:   Procedure Laterality Date   . ENDOMETRIAL ABLATION  1998   . HX ANKLE FRACTURE TX  1990s    Right, Doran Durand procedure   . HX CATARACT REMOVAL Bilateral 2013    r and l eyes with implants   . HX CESAREAN SECTION  06/20/2000    x3, 11/21/86, 11/10/84   . HX CHOLECYSTECTOMY  1988   . HX CORONARY ARTERY BYPASS GRAFT  03/31/2011    cardiac, 2 vessel, Dr. [name], [facility]   . HX CORONARY STENT PLACEMENT  2012    x4 stents   . HX GASTRIC SLEEVE  02/2017    [place], [place], Dr. Pearlie Oyster   . HX HEART CATHETERIZATION  2012    massive heart attack and stents - [facility]   . HX THYROID BIOPSY     . HX THYROIDECTOMY  101/01/2018   .  HX TONSILLECTOMY      as child   . HX YAG Left 09/20/2013          Medications:  Current Outpatient Medications   Medication Sig   . ACCU-CHEK AVIVA PLUS TEST STRP Strip USE 1 STRIP TO TEST BLOOD SUGAR THREE TIMES A DAY   . Blood-Glucose Meter (ACCU-CHEK AVIVA PLUS METER) Misc 1 Kit by Does not apply route Three times a day   . clopidogreL (PLAVIX) 75 mg Oral Tablet TAKE 1 TABLET BY MOUTH ONCE DAILY   . cyanocobalamin, vitamin B-12, 1,000 mcg/mL Oral Drops Take 1 mL (1,000 mcg total) by mouth Every 7 days   . diclofenac sodium (VOLTAREN) 1 % Gel Apply 2 g topically Three times a day as needed   . docusate sodium (COLACE) 100 mg Oral Capsule Take 1 Cap (100 mg total) by mouth Twice daily   . empagliflozin (JARDIANCE) 10 mg Oral Tablet Take 10 mg by mouth Once a day (Patient not taking: No sig reported)   . ergocalciferol, vitamin D2, (DRISDOL) 1,250 mcg (50,000 unit) Oral Capsule TAKE 1 CAPSULE BY MOUTH EVERY WEEK   . furosemide (LASIX) 40 mg Oral Tablet TAKE 1 TABLET(40 MG) BY MOUTH EVERY DAY   . HYDROcodone-acetaminophen (NORCO) 10-325 mg Oral Tablet Take 1 Tablet by mouth Every 8 hours as needed for Pain [name] with [facility]   . levothyroxine (SYNTHROID) 25 mcg Oral Tablet Take 1 Tablet (25 mcg total) by mouth Once a day   . lisinopriL (PRINIVIL) 2.5 mg Oral Tablet TAKE 1 TABLET BY MOUTH EVERY DAY (Patient not taking: No sig reported)   . LORazepam (ATIVAN) 0.5 mg Oral Tablet Take 45 minutes before MRI   . metFORMIN (GLUCOPHAGE) 500 mg Oral Tablet TAKE 1 TABLET BY MOUTH THREE TIMES DAILY WITH MEALS   . metoprolol succinate (TOPROL-XL) 25 mg Oral Tablet Sustained Release 24 hr Take 0.5 Tablets (12.5 mg total) by mouth Once a day   . NARCAN 4 mg/actuation Nasal Spray, Non-Aerosol PT STATED SHE HAS NARCAN AT HOME BUT IS NOT TAKING IT   . niacin (NIASPAN) 500 mg Oral Tablet Sustained Release Take 1 Tab (500 mg total) by mouth Once a day   . nitroGLYCERIN (NITROSTAT) 0.4 mg Sublingual Tablet, Sublingual Place 1 Tablet (0.4 mg total) under the tongue Every 5 minutes as needed for Chest pain for 3 doses over 15 minutes   . omeprazole (PRILOSEC) 40 mg Oral Capsule, Delayed Release(E.C.) TAKE 1 CAPSULE BY MOUTH EVERY DAY   . ondansetron (ZOFRAN ODT) 8 mg Oral Tablet, Rapid Dissolve DISSOLVE 1 TABLET(8 MG) ON THE TONGUE EVERY 8 HOURS AS NEEDED FOR NAUSEA OR VOMITING   . PRENATAL PLUS, CALCIUM CARB, 27 mg iron- 1 mg Oral Tablet Take 1 Tab by mouth Once a day   . rosuvastatin (CRESTOR) 40 mg Oral Tablet Take 1 Tablet (40 mg total) by mouth Once a day TAKE 1 TABLET BY MOUTH ONCE DAILY   . semaglutide (OZEMPIC) 0.25 mg or 0.5 mg(2 mg/1.5 mL) Subcutaneous Pen Injector Inject 0.5 mg under the skin Every 7 days   . venlafaxine (EFFEXOR XR) 37.5 mg Oral Capsule, Sust. Release 24 hr TAKE 1 CAPSULE BY MOUTH EVERY DAY   . ZYRTEC-D 5-120 mg Oral Tablet Sustained Release 12 hr Take 1 Tab by mouth Once a day     Allergies:  Allergies   Allergen Reactions   . Ane Payment [Pyrilamine-Dextromethorphan]  Other Adverse Reaction (Add comment)  LIGHT HEADED   . Tylenol W Codeine [Acetaminophen-Codeine]  Other Adverse Reaction (Add comment)     pt states that  one time she had chest pains with this med, but dr. thought it was because she had not eaten     Family history:  Family Medical History:     Problem Relation (Age of Onset)    Atrial fibrillation Sister    Colon Cancer Father    Diabetes Mother, Maternal Aunt    Heart Disease Sister, Maternal Grandfather    Hypertension (High Blood Pressure) Mother    Lung Cancer Maternal Grandfather    MI <71 years of age Mother          Social history:  Social History     Tobacco Use   . Smoking status: Current Every Day Smoker     Packs/day: 0.50     Years: 20.00     Pack years: 10.00     Types: Cigarettes     Start date: 25   . Smokeless tobacco: Never Used   Vaping Use   . Vaping Use: Never used   Substance Use Topics   . Alcohol use: No   . Drug use: No        There are no exam notes on file for this visit.    Vitals:    08/23/21 2002   BP: 118/70   Pulse: 77   Resp: 16   Temp: 36.1 C (97 F)   SpO2: 98%   Weight: 94.3 kg (208 lb)   Height: 1.676 m (5' 6" )   BMI: 33.64        Physical Examination:  Physical Exam  Vitals and nursing note reviewed.   Constitutional:       General: She is not in acute distress.     Appearance: Normal appearance.   HENT:      Head: Normocephalic and atraumatic.      Right Ear: Tympanic membrane and ear canal normal.      Left Ear: Tympanic membrane and ear canal normal.      Nose: Nose normal. No congestion or rhinorrhea.      Mouth/Throat:      Mouth: Mucous membranes are moist.      Pharynx: Oropharynx is clear. No oropharyngeal exudate or posterior oropharyngeal erythema.   Eyes:      Extraocular Movements: Extraocular movements intact.      Pupils: Pupils are equal, round, and reactive to light.   Cardiovascular:      Rate and Rhythm: Normal rate and regular rhythm.      Pulses: Normal pulses.      Heart sounds: Normal heart sounds, S1 normal and S2 normal. No murmur heard.  Pulmonary:      Effort: Pulmonary effort is normal. No respiratory distress.      Breath sounds: Normal breath  sounds and air entry.   Musculoskeletal:      Cervical back: Normal range of motion and neck supple. No tenderness.   Lymphadenopathy:      Cervical: No cervical adenopathy.   Skin:     General: Skin is warm and dry.      Capillary Refill: Capillary refill takes less than 2 seconds.   Neurological:      General: No focal deficit present.      Mental Status: She is alert and oriented to person, place, and time.      Cranial Nerves: Cranial nerves are intact.  Visit Diagnosis:    ICD-10-CM    1. Exposure to COVID-19 virus  Z20.822    2. Headache  R51.9         Plan:  Covid test pending, will call with results  Self quarantine until COVID results are received  Monitor for fever  Tylenol as directed as needed for fever/pain  Follow-up with PCP if symptoms persist  Return to clinic if needed  ER for any new or worsening symptoms. Patient verbalized understanding.       I saw this patient independently   The supervising/collaborating physician for this visit was Dr. Marry Guan, APRN  08/23/2021, 59:29  Electronically signed by Jeffie Pollock, APRN

## 2021-08-24 LAB — COVID-19 ~~LOC~~ MOLECULAR LAB TESTING: SARS-CoV-2: DETECTED — AB

## 2021-09-11 ENCOUNTER — Other Ambulatory Visit: Payer: Self-pay

## 2021-09-12 ENCOUNTER — Ambulatory Visit (INDEPENDENT_AMBULATORY_CARE_PROVIDER_SITE_OTHER): Payer: Commercial Managed Care - PPO | Admitting: Family Medicine

## 2021-09-12 ENCOUNTER — Encounter (INDEPENDENT_AMBULATORY_CARE_PROVIDER_SITE_OTHER): Payer: Self-pay | Admitting: Family Medicine

## 2021-09-12 VITALS — BP 116/70 | HR 62 | Resp 16 | Ht 66.0 in | Wt 212.0 lb

## 2021-09-12 DIAGNOSIS — E1165 Type 2 diabetes mellitus with hyperglycemia: Secondary | ICD-10-CM

## 2021-09-12 DIAGNOSIS — F329 Major depressive disorder, single episode, unspecified: Secondary | ICD-10-CM

## 2021-09-12 DIAGNOSIS — I25810 Atherosclerosis of coronary artery bypass graft(s) without angina pectoris: Secondary | ICD-10-CM

## 2021-09-12 DIAGNOSIS — K219 Gastro-esophageal reflux disease without esophagitis: Secondary | ICD-10-CM

## 2021-09-12 DIAGNOSIS — Z23 Encounter for immunization: Secondary | ICD-10-CM

## 2021-09-12 NOTE — Progress Notes (Signed)
[facility], [facility]  [place] [address]       Name: Monique [name] MRN:  [id number]   Date: 09/12/2021 Age: 55 y.o.     Chief complaint: The patient is a 55 y.o. old female who came in today for Diabetes.    HPI:    Diabetes: Last HbA1C was 8%.  She has been following with an endocrinologist Dr. Alvino Chapel.  She is here for follow-up today.  Patient denies symptoms of polydipsia, polyuria, hypoglycemic unawareness,  chest pain, dyspnea on exertion, diarrhea, foot ulcerations, paresthesias. Symptoms are not changed. Patient's home blood sugars are poorly controlled. Patient is compliant with medications.  She is currently taking Ozempic and metformin.    She denies the need for refill today.  She is willing to have preventative immunizations.    ROS:  Review of systems completed with all positives and pertinent negatives as per HPI.  She reports her depression symptoms are well controlled.  She continues to attempt to lose weight.  Otherwise, negative.    Past medical history:  Past Medical History:   Diagnosis Date   . Anxiety    . Carotid stenosis    . Chronic back pain    . Coronary artery disease involving native coronary artery of native heart    . DDD (degenerative disc disease), lumbar     "entire spine"   . Ear piercing    . GERD (gastroesophageal reflux disease)    . H/O complete eye exam     2 years, Dr. [name], at [facility]   . History of dental examination     dentures upper and lower   . HTN    . Hypercholesterolemia    . Hyperlipidemia    . MI (myocardial infarction) (CMS [place]) 2012   . Neck problem     herniated cervical disc   . Obesity (BMI 30-39.9)    . 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 [place] ordered.    . Stroke (CMS [facility])     2002   . Tattoo     Left breast, Right shoulder   . Thyroid disorder    . Thyroid nodule    . Uncontrolled type 2 diabetes mellitus, without long-term current use of  insulin    . Xanthelasma      Past surgical history:  Past Surgical History:   Procedure Laterality Date   . ENDOMETRIAL ABLATION  1998   . HX ANKLE FRACTURE TX  1990s    Right, Doran Durand procedure   . HX CATARACT REMOVAL Bilateral 2013    r and l eyes with implants   . HX CESAREAN SECTION  06/20/2000    x3, 11/21/86, 11/10/84   . HX CHOLECYSTECTOMY  1988   . HX CORONARY ARTERY BYPASS GRAFT  03/31/2011    cardiac, 2 vessel, Dr. [name], [facility]   . HX CORONARY STENT PLACEMENT  2012    x4 stents   . HX GASTRIC SLEEVE  02/2017    [place], [place], Dr. Pearlie Oyster   . HX HEART CATHETERIZATION  2012    [facility] heart [facility] stents - [facility]   . HX THYROID BIOPSY     . HX THYROIDECTOMY  101/01/2018   . HX TONSILLECTOMY      as child   . HX YAG Left 09/20/2013     Medications:  Current Outpatient Medications  Medication Sig   . ACCU-CHEK AVIVA PLUS TEST STRP Strip USE 1 STRIP TO TEST BLOOD SUGAR THREE TIMES A DAY   . Blood-Glucose Meter (ACCU-CHEK AVIVA PLUS METER) Misc 1 Kit by Does not apply route Three times a day   . clopidogreL (PLAVIX) 75 mg Oral Tablet TAKE 1 TABLET BY MOUTH ONCE DAILY   . cyanocobalamin, vitamin B-12, 1,000 mcg/mL Oral Drops Take 1 mL (1,000 mcg total) by mouth Every 7 days   . diclofenac sodium (VOLTAREN) 1 % Gel Apply 2 g topically Three times a day as needed   . docusate sodium (COLACE) 100 mg Oral Capsule Take 1 Cap (100 mg total) by mouth Twice daily   . empagliflozin (JARDIANCE) 10 mg Oral Tablet Take 10 mg by mouth Once a day   . ergocalciferol, vitamin D2, (DRISDOL) 1,250 mcg (50,000 unit) Oral Capsule TAKE 1 CAPSULE BY MOUTH EVERY WEEK   . furosemide (LASIX) 40 mg Oral Tablet TAKE 1 TABLET(40 MG) BY MOUTH EVERY DAY   . HYDROcodone-acetaminophen (NORCO) 10-325 mg Oral Tablet Take 1 Tablet by mouth Every 8 hours as needed for Pain [name] with [facility]   . levothyroxine (SYNTHROID) 25 mcg Oral Tablet Take 1 Tablet (25 mcg total) by mouth Once a day   . LORazepam  (ATIVAN) 0.5 mg Oral Tablet Take 45 minutes before MRI   . metFORMIN (GLUCOPHAGE) 500 mg Oral Tablet TAKE 1 TABLET BY MOUTH THREE TIMES DAILY WITH MEALS   . metoprolol succinate (TOPROL-XL) 25 mg Oral Tablet Sustained Release 24 hr Take 0.5 Tablets (12.5 mg total) by mouth Once a day   . NARCAN 4 mg/actuation Nasal Spray, Non-Aerosol PT STATED SHE HAS NARCAN AT HOME BUT IS NOT TAKING IT   . niacin (NIASPAN) 500 mg Oral Tablet Sustained Release Take 1 Tab (500 mg total) by mouth Once a day   . nitroGLYCERIN (NITROSTAT) 0.4 mg Sublingual Tablet, Sublingual Place 1 Tablet (0.4 mg total) under the tongue Every 5 minutes as needed for Chest pain for 3 doses over 15 minutes   . omeprazole (PRILOSEC) 40 mg Oral Capsule, Delayed Release(E.C.) TAKE 1 CAPSULE BY MOUTH EVERY DAY   . ondansetron (ZOFRAN ODT) 8 mg Oral Tablet, Rapid Dissolve DISSOLVE 1 TABLET(8 MG) ON THE TONGUE EVERY 8 HOURS AS NEEDED FOR NAUSEA OR VOMITING   . PRENATAL PLUS, CALCIUM CARB, 27 mg iron- 1 mg Oral Tablet Take 1 Tab by mouth Once a day   . rosuvastatin (CRESTOR) 40 mg Oral Tablet Take 1 Tablet (40 mg total) by mouth Once a day TAKE 1 TABLET BY MOUTH ONCE DAILY   . semaglutide (OZEMPIC) 0.25 mg or 0.5 mg(2 mg/1.5 mL) Subcutaneous Pen Injector Inject 0.5 mg under the skin Every 7 days   . venlafaxine (EFFEXOR XR) 37.5 mg Oral Capsule, Sust. Release 24 hr TAKE 1 CAPSULE BY MOUTH EVERY DAY   . ZYRTEC-D 5-120 mg Oral Tablet Sustained Release 12 hr Take 1 Tab by mouth Once a day     Vitals:    09/12/21 1039   BP: 116/70   Pulse: 62   Resp: 16   SpO2: 98%   Weight: 96.2 kg (212 lb)   Height: 1.676 m (_0 )   BMI: 34.29         Physical Examination:    GENERAL:   Pt is a pleasant, well-nourished, well-developed 55 y.o. female who is in NAD. Appears stated age  56:  head normocephalic, symmetrical facies. EOM  intact b/l. PERRLA. Sclera non-icteric, non-injected. upper eyelid w/Xythoma-lipid plaques, diminished in fullness and intensity. Oropharyngeal  mucous membranes are moist  w/o erythema/exudates   NECK:   No masses, no lymphadenopathy, no JVD on exam. Trachea midline, no thyromegaly   CV: S1, S2. No murmurs, rubs, gallops.     LUNGS: CTAB, No rhonchi, rales, wheezes.   GI: (+) BS in all 4 quadrants. soft, NT/ND. No rigidity/positive for guarding/negative for rebound. No organomegaly/masses, or abdominal bruits,   MSK: Spontaneous normal AROM of major joints as observed during exam. No joint effusions/ swelling/ deformities.   No BLE edema, clubbing, or cyanosis.   2+ radial pulse present b/l.   + 2 reflexes Brachioradialis and patellar bilaterally.   Gait normal.  SKIN: No significant lesions, rashes, ecchymoses noted.  She has benign lesions on her forehead and the right lateral canthus. She would like these excised.  NEURO: AAOx4, CN grossly intact. Sensation intact b/l. No focal deficits.  PSYCH: Mood, behavior and affect normal w/ Intact judgement & insight  Exam is stable.    There are no exam notes on file for this visit.    Diabetes Monitors    CBC  Diff   Lab Results   Component Value Date/Time    WBC 9.2 05/08/2021 12:25 PM    HGB 13.7 05/08/2021 12:25 PM    HCT 42.1 05/08/2021 12:25 PM    PLTCNT 431 (H) 05/08/2021 12:25 PM    RBC 4.63 05/08/2021 12:25 PM    MCV 90.9 05/08/2021 12:25 PM    MCHC 32.5 05/08/2021 12:25 PM    MCH 29.6 05/08/2021 12:25 PM    RDW 14.2 05/08/2021 12:25 PM    MPV 10.2 01/30/2020 12:13 PM    Lab Results   Component Value Date/Time    PMNS 47 05/08/2021 12:25 PM    LYMPHOCYTES 40 05/08/2021 12:25 PM    EOSINOPHIL 2 05/08/2021 12:25 PM    MONOCYTES 12 05/08/2021 12:25 PM    BASOPHILS 1 05/08/2021 12:25 PM    BASOPHILS 0.05 05/08/2021 12:25 PM    PMNABS 4.25 05/08/2021 12:25 PM    LYMPHSABS 3.62 05/08/2021 12:25 PM    EOSABS 0.16 05/08/2021 12:25 PM    MONOSABS 1.07 (H) 05/08/2021 12:25 PM    BASOSABS 0.034 04/01/2011 05:08 AM          COMPREHENSIVE METABOLIC PANEL - NON FASTING  Lab Results   Component Value Date    SODIUM 142  06/24/2021    POTASSIUM 4.5 06/24/2021    CHLORIDE 106 06/24/2021    CO2 25 06/24/2021    ANIONGAP 11 06/24/2021    BUN 10 06/24/2021    CREATININE 0.89 06/24/2021    GLUCOSENF 193 (H) 06/24/2021    CALCIUM 9.4 06/24/2021    ALBUMIN 4.2 05/08/2021    TOTALPROTEIN 6.8 05/08/2021    ALKPHOS 92 05/08/2021    AST 21 05/08/2021    ALT 14 05/08/2021       Visit Diagnosis    Encounter Diagnoses   Name Primary?   . Type 2 diabetes mellitus with hyperglycemia (CMS [place]) Yes   . Need for vaccination      Orders Placed This Encounter   . Flu Vaccine, 6 month-adult,0.5 mL IM (Admin)   . Vaxneuvance (PCV 15) (Admin)   . HGA1C (HEMOGLOBIN A1C WITH EST AVG GLUCOSE)       Type 2 diabetes mellitus with complication, without long-term current use of insulin (CMS [facility])  Chronic worsening  control  HbA1C 8%.  Follow-up with Dr. Alvino Chapel  continue diabetic diet.  Continue to address weight loss which is stalled.  Continue metformin 500 mg two pills Bi.d..  Continue Ozempic.  Not currently using Jardiance.  Stopped Glyxambi 10-5 mg.    Encouraged her to check blood sugars 3-4 times daily.  Discuss renal concerns and need for monitoring.   A1c ordered as above.    Other chronic conditions reviewed but not specifically addressed today:    Allergic rhinitis:  Continue histamine blocker.  Will add pseudoephedrine.    Coronary artery disease  Status post CABG  Continue Plavix  Continue beta-blocker  Continue statin.  Continue sublingual nitro p.r.n. for chest pain.  Continue with Cardiology.    Depression, unspecified depression type  Chronic, controlled.  See PHQ above.   Continue Effexor    Weight loss:  Has lost significant weight since our last encounter.  Continue behavioral efforts.    GERD  Chronic, Uncontrolled problem   Encouraged lifestyle modification to help with acid reflux, including:   Weight loss   Avoid eating for 2-3 hours prior to bedtime. Avoid late night snacking.    Avoid hot/spicy and acidic foods and overeating.     Remain upright after eating, avoid tight clothing.    Elevate head of bed.   Chew gum or lozenges.   Refrain from alcohol and smoking.   Continue histamine blocker.  Continue  proton pump inhibitor.  Has appointment General surgery for upper GI scope.  She will attend to the emergency room if her symptoms warrant prior to that encounter.      Chronic left-sided lower extremity pain  We review her imaging to her satisfaction.  Copies of the results are offered to the patient.  Patient with osteoarthritis  Has comorbid peripheral vascular disease  Will increase her statin follow.  Patient has follow-ups with orthopedics for the hip and knee.  Continue follow-up with Dr. Burman Riis in [facility]. for low back pain.    We again discussed use of weight loss as a primary method for pain relief.  Continue on muscle relaxer.  Patient avoids nonsteroidals secondary to her bariatric surgery.  Labs have been reassuring.    Carotid Art Stenosis:   50% stenosis to the right ICA  100% stenosis to left ICA.  Continue with Cardiology, Interventional Cardiology  Has had follow-ups with vascular surgery scheduled but has not completed those encounters.    Will follow.    Right facial nevus  Would benefit from biopsy.  Was seen by local derm and   recommend watchful waiting.   She seeks second opinion.   Will look into derm in [place].   Will follow.    OSA:  Could not keep appt for sleep study,   Continue with Neurology, missed last appointment on 08/05/2021.        Return in about 6 months (around 03/13/2022), or if symptoms worsen or fail to improve, for In Person Visit, chronic disease management.    Cherly Beach, MD  09/12/2021, 15:48  This note was partially generated using MModal Fluency Direct system, and there may be some incorrect words, spellings, and punctuation that were not noted in checking the note before saving.    PDMP is reviewed today.

## 2021-09-26 ENCOUNTER — Other Ambulatory Visit: Payer: Self-pay

## 2021-09-26 ENCOUNTER — Ambulatory Visit (INDEPENDENT_AMBULATORY_CARE_PROVIDER_SITE_OTHER): Payer: Commercial Managed Care - PPO | Admitting: NURSE PRACTITIONER

## 2021-09-26 ENCOUNTER — Encounter (INDEPENDENT_AMBULATORY_CARE_PROVIDER_SITE_OTHER): Payer: Self-pay

## 2021-09-26 VITALS — BP 130/74 | HR 83 | Temp 97.7°F | Resp 16 | Ht 66.0 in | Wt 213.0 lb

## 2021-09-26 DIAGNOSIS — R059 Cough, unspecified: Secondary | ICD-10-CM

## 2021-09-26 DIAGNOSIS — J324 Chronic pansinusitis: Secondary | ICD-10-CM

## 2021-09-26 MED ORDER — AMOXICILLIN 875 MG-POTASSIUM CLAVULANATE 125 MG TABLET
1.0000 | ORAL_TABLET | Freq: Two times a day (BID) | ORAL | 0 refills | Status: AC
Start: 2021-09-26 — End: 2021-10-06

## 2021-09-26 MED ORDER — GUAIFENESIN 100 MG/5 ML ORAL LIQUID
200.0000 mg | Freq: Four times a day (QID) | ORAL | 0 refills | Status: DC | PRN
Start: 2021-09-26 — End: 2022-03-19

## 2021-09-26 MED ORDER — BENZONATATE 200 MG CAPSULE
200.0000 mg | ORAL_CAPSULE | Freq: Three times a day (TID) | ORAL | 0 refills | Status: DC | PRN
Start: 2021-09-26 — End: 2021-09-26

## 2021-09-26 NOTE — Addendum Note (Signed)
Addended by: Kavin Leech on: 09/26/2021 03:55 PM     Modules accepted: Orders

## 2021-09-26 NOTE — Progress Notes (Addendum)
[address], [facility] URGENT CARE  [address]  [place] [address]    Progress Note    Name: Monique [name] MRN:  [id number]   Date: 09/26/2021 Age: 55 y.o.         Reason for Visit: Cough, Sinus Problem, and Sore Throat    History of Present Illness  Monique [name] is a 55 y.o. female who is being seen today for The above stated symptoms, denies alleviating factors.  Symptoms 4 days. Pt states she had covid last month.    Nursing Notes:  There are no exam notes on file for this visit.    Past Medical History:   Diagnosis Date   . Anxiety    . Carotid stenosis    . Chronic back pain    . Coronary artery disease involving native coronary artery of native heart    . DDD (degenerative disc disease), lumbar     "entire spine"   . Ear piercing    . GERD (gastroesophageal reflux disease)    . H/O complete eye exam     2 years, Dr. [name], at [facility]   . History of dental examination     dentures upper and lower   . HTN    . Hypercholesterolemia    . Hyperlipidemia    . MI (myocardial infarction) (CMS [facility]) 2012   . Neck problem     herniated cervical disc   . Obesity (BMI 30-39.9)    . 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 [place] ordered.    . Stroke (CMS [facility])     2002   . Tattoo     Left breast, Right shoulder   . Thyroid disorder    . Thyroid nodule    . Uncontrolled type 2 diabetes mellitus, without long-term current use of insulin    . Xanthelasma          Past Surgical History:   Procedure Laterality Date   . ENDOMETRIAL ABLATION  1998   . HX ANKLE FRACTURE [place]  1990s    Right, Doran Durand procedure   . HX CATARACT REMOVAL Bilateral 2013    r and l eyes with implants   . HX CESAREAN SECTION  06/20/2000    x3, 11/21/86, 11/10/84   . HX CHOLECYSTECTOMY  1988   . HX CORONARY ARTERY BYPASS GRAFT  03/31/2011    cardiac, 2 vessel, Dr. [name], [facility]   . HX CORONARY STENT PLACEMENT  2012    x4 stents   . HX GASTRIC SLEEVE  02/2017     [place], [place], Dr. Pearlie Oyster   . HX HEART CATHETERIZATION  2012    massive heart attack and stents - [facility]   . HX THYROID BIOPSY     . HX THYROIDECTOMY  101/01/2018   . HX TONSILLECTOMY      as child   . HX YAG Left 09/20/2013         Current Outpatient Medications   Medication Sig   . ACCU-CHEK AVIVA PLUS TEST STRP Strip USE 1 STRIP TO TEST BLOOD SUGAR THREE TIMES A DAY   . amoxicillin-pot clavulanate (AUGMENTIN) 875-125 mg Oral Tablet Take 1 Tablet by mouth Twice daily for 10 days   . Blood-Glucose Meter (ACCU-CHEK AVIVA PLUS METER) Misc 1 Kit by Does not apply route Three times a day   . clopidogreL (PLAVIX) 75 mg Oral Tablet TAKE 1 TABLET BY  MOUTH ONCE DAILY   . cyanocobalamin, vitamin B-12, 1,000 mcg/mL Oral Drops Take 1 mL (1,000 mcg total) by mouth Every 7 days   . diclofenac sodium (VOLTAREN) 1 % Gel Apply 2 g topically Three times a day as needed   . docusate sodium (COLACE) 100 mg Oral Capsule Take 1 Cap (100 mg total) by mouth Twice daily   . empagliflozin (JARDIANCE) 10 mg Oral Tablet Take 10 mg by mouth Once a day   . ergocalciferol, vitamin D2, (DRISDOL) 1,250 mcg (50,000 unit) Oral Capsule TAKE 1 CAPSULE BY MOUTH EVERY WEEK   . furosemide (LASIX) 40 mg Oral Tablet TAKE 1 TABLET(40 MG) BY MOUTH EVERY DAY   . guaiFENesin 100 mg/5 mL Oral Liquid Take 10 mL (200 mg total) by mouth Every 6 hours as needed   . HYDROcodone-acetaminophen (NORCO) 10-325 mg Oral Tablet Take 1 Tablet by mouth Every 8 hours as needed for Pain [name] with [facility]   . levothyroxine (SYNTHROID) 25 mcg Oral Tablet Take 1 Tablet (25 mcg total) by mouth Once a day   . LORazepam (ATIVAN) 0.5 mg Oral Tablet Take 45 minutes before MRI   . metFORMIN (GLUCOPHAGE) 500 mg Oral Tablet TAKE 1 TABLET BY MOUTH THREE TIMES DAILY WITH MEALS   . metoprolol succinate (TOPROL-XL) 25 mg Oral Tablet Sustained Release 24 hr Take 0.5 Tablets (12.5 mg total) by mouth Once a day   . NARCAN 4 mg/actuation Nasal Spray, Non-Aerosol PT  STATED SHE HAS NARCAN AT HOME BUT IS NOT TAKING IT   . niacin (NIASPAN) 500 mg Oral Tablet Sustained Release Take 1 Tab (500 mg total) by mouth Once a day   . nitroGLYCERIN (NITROSTAT) 0.4 mg Sublingual Tablet, Sublingual Place 1 Tablet (0.4 mg total) under the tongue Every 5 minutes as needed for Chest pain for 3 doses over 15 minutes   . omeprazole (PRILOSEC) 40 mg Oral Capsule, Delayed Release(E.C.) TAKE 1 CAPSULE BY MOUTH EVERY DAY   . ondansetron (ZOFRAN ODT) 8 mg Oral Tablet, Rapid Dissolve DISSOLVE 1 TABLET(8 MG) ON THE TONGUE EVERY 8 HOURS AS NEEDED FOR NAUSEA OR VOMITING   . PRENATAL PLUS, CALCIUM CARB, 27 mg iron- 1 mg Oral Tablet Take 1 Tab by mouth Once a day   . rosuvastatin (CRESTOR) 40 mg Oral Tablet Take 1 Tablet (40 mg total) by mouth Once a day TAKE 1 TABLET BY MOUTH ONCE DAILY   . semaglutide (OZEMPIC) 0.25 mg or 0.5 mg(2 mg/1.5 mL) Subcutaneous Pen Injector Inject 0.5 mg under the skin Every 7 days   . venlafaxine (EFFEXOR XR) 37.5 mg Oral Capsule, Sust. Release 24 hr TAKE 1 CAPSULE BY MOUTH EVERY DAY   . ZYRTEC-D 5-120 mg Oral Tablet Sustained Release 12 hr Take 1 Tab by mouth Once a day     Allergies   Allergen Reactions   . Ane Payment [Pyrilamine-Dextromethorphan]  Other Adverse Reaction (Add comment)     LIGHT HEADED   . Tylenol W Codeine [Acetaminophen-Codeine]  Other Adverse Reaction (Add comment)     pt states that one time she had chest pains with this med, but dr. thought it was because she had not eaten     Family Medical History:     Problem Relation (Age of Onset)    Atrial fibrillation Sister    Colon Cancer Father    Diabetes Mother, Maternal Aunt    Heart Disease Sister, Maternal Grandfather    Hypertension (High Blood Pressure) Mother  Lung Cancer Maternal Grandfather    MI <26 years of age Mother          Social History     Tobacco Use   . Smoking status: Every Day     Packs/day: 0.50     Years: 20.00     Pack years: 10.00     Types: Cigarettes     Start date: 30   . Smokeless  tobacco: Never   Vaping Use   . Vaping Use: Never used   Substance Use Topics   . Alcohol use: No   . Drug use: No       Review of Systems  Review of Systems   Constitutional: Negative.    HENT: Positive for congestion, sinus pain and sore throat.    Respiratory: Positive for cough.    Gastrointestinal: Negative.    Musculoskeletal: Negative.        Physical Exam:  BP 130/74   Pulse 83   Temp 36.5 C (97.7 F)   Resp 16   Ht 1.676 m (5' 6" )   Wt 96.6 kg (213 lb)   LMP  (LMP Unknown)   SpO2 97%   BMI 34.38 kg/m       Physical Exam  Vitals and nursing note reviewed.   Constitutional:       General: She is not in acute distress.  HENT:      Head: Normocephalic and atraumatic.      Right Ear: Tympanic membrane, ear canal and external ear normal.      Left Ear: Tympanic membrane, ear canal and external ear normal.      Nose: Congestion present.      Right Sinus: Maxillary sinus tenderness and frontal sinus tenderness present.      Left Sinus: Maxillary sinus tenderness and frontal sinus tenderness present.      Mouth/Throat:      Mouth: Mucous membranes are moist.      Pharynx: Oropharynx is clear.   Eyes:      Conjunctiva/sclera: Conjunctivae normal.   Cardiovascular:      Rate and Rhythm: Normal rate and regular rhythm.      Heart sounds: Normal heart sounds.   Pulmonary:      Effort: Pulmonary effort is normal. No respiratory distress.      Breath sounds: Normal breath sounds.   Lymphadenopathy:      Cervical: No cervical adenopathy.   Skin:     General: Skin is warm and dry.   Neurological:      Mental Status: She is alert and oriented to person, place, and time.   Psychiatric:         Mood and Affect: Affect normal.         Judgment: Judgment normal.         Assessment:    Pansinusitis, unspecified chronicity    Cough, unspecified type        Plan:  Follow up immediately for new or worsening symptoms.  Tylenol for pain and fever.      Orders Placed This Encounter   . amoxicillin-pot clavulanate (AUGMENTIN)  875-125 mg Oral Tablet   . guaiFENesin 100 mg/5 mL Oral Liquid       Follow up: No follow-ups on file.    Follow up with PCP.  Seek medical attention for new or worsening symptoms.    This note was partially created using MModal Fluency Direct system (voice recognition software) and is inherently subject to errors including those  of syntax and "sound-alike" substitutions which may escape proofreading.  In such instances, original meaning may be extrapolated by contextual derivation.     I saw the patient independently.            Kavin Leech, FNP  09/26/2021, 15:46

## 2021-10-02 ENCOUNTER — Encounter (INDEPENDENT_AMBULATORY_CARE_PROVIDER_SITE_OTHER): Payer: Self-pay | Admitting: Family Medicine

## 2021-10-22 ENCOUNTER — Other Ambulatory Visit (INDEPENDENT_AMBULATORY_CARE_PROVIDER_SITE_OTHER): Payer: Self-pay | Admitting: Family Medicine

## 2021-10-22 DIAGNOSIS — E118 Type 2 diabetes mellitus with unspecified complications: Secondary | ICD-10-CM

## 2021-10-22 DIAGNOSIS — I219 Acute myocardial infarction, unspecified: Secondary | ICD-10-CM

## 2021-10-22 DIAGNOSIS — E559 Vitamin D deficiency, unspecified: Secondary | ICD-10-CM

## 2021-10-22 DIAGNOSIS — I739 Peripheral vascular disease, unspecified: Secondary | ICD-10-CM

## 2021-10-22 DIAGNOSIS — E119 Type 2 diabetes mellitus without complications: Secondary | ICD-10-CM

## 2021-10-22 DIAGNOSIS — R609 Edema, unspecified: Secondary | ICD-10-CM

## 2021-10-22 DIAGNOSIS — I6529 Occlusion and stenosis of unspecified carotid artery: Secondary | ICD-10-CM

## 2021-10-22 DIAGNOSIS — K219 Gastro-esophageal reflux disease without esophagitis: Secondary | ICD-10-CM

## 2021-10-22 DIAGNOSIS — E079 Disorder of thyroid, unspecified: Secondary | ICD-10-CM

## 2021-10-22 DIAGNOSIS — R52 Pain, unspecified: Secondary | ICD-10-CM

## 2021-10-22 DIAGNOSIS — F32A Depression, unspecified: Secondary | ICD-10-CM

## 2021-10-22 DIAGNOSIS — K5900 Constipation, unspecified: Secondary | ICD-10-CM

## 2021-10-22 DIAGNOSIS — I10 Essential (primary) hypertension: Secondary | ICD-10-CM

## 2021-10-22 DIAGNOSIS — T7840XA Allergy, unspecified, initial encounter: Secondary | ICD-10-CM

## 2021-10-22 DIAGNOSIS — R110 Nausea: Secondary | ICD-10-CM

## 2021-10-22 DIAGNOSIS — E538 Deficiency of other specified B group vitamins: Secondary | ICD-10-CM

## 2021-10-22 DIAGNOSIS — I639 Cerebral infarction, unspecified: Secondary | ICD-10-CM

## 2021-10-22 DIAGNOSIS — E785 Hyperlipidemia, unspecified: Secondary | ICD-10-CM

## 2021-10-22 MED ORDER — ACCU-CHEK AVIVA PLUS TEST STRIPS
ORAL_STRIP | 3 refills | Status: DC
Start: 2021-10-22 — End: 2022-05-12

## 2021-10-22 MED ORDER — ROSUVASTATIN 40 MG TABLET
40.0000 mg | ORAL_TABLET | Freq: Every day | ORAL | 2 refills | Status: DC
Start: 2021-10-22 — End: 2022-06-11

## 2021-10-22 MED ORDER — ONDANSETRON 8 MG DISINTEGRATING TABLET
ORAL_TABLET | ORAL | 3 refills | Status: DC
Start: 2021-10-22 — End: 2022-04-01

## 2021-10-22 MED ORDER — ZYRTEC-D 5 MG-120 MG TABLET,EXTENDED RELEASE
1.0000 | ORAL_TABLET | Freq: Every day | ORAL | 3 refills | Status: DC
Start: 2021-10-22 — End: 2024-09-21

## 2021-10-22 MED ORDER — METFORMIN 500 MG TABLET
ORAL_TABLET | ORAL | 3 refills | Status: DC
Start: 2021-10-22 — End: 2021-12-20

## 2021-10-22 MED ORDER — DOCUSATE SODIUM 100 MG CAPSULE
100.0000 mg | ORAL_CAPSULE | Freq: Two times a day (BID) | ORAL | 2 refills | Status: DC
Start: 2021-10-22 — End: 2022-01-01

## 2021-10-22 MED ORDER — ERGOCALCIFEROL (VITAMIN D2) 1,250 MCG (50,000 UNIT) CAPSULE
ORAL_CAPSULE | ORAL | 2 refills | Status: DC
Start: 2021-10-22 — End: 2022-10-27

## 2021-10-22 MED ORDER — OMEPRAZOLE 40 MG CAPSULE,DELAYED RELEASE
DELAYED_RELEASE_CAPSULE | ORAL | 3 refills | Status: DC
Start: 2021-10-22 — End: 2022-10-27

## 2021-10-22 MED ORDER — METOPROLOL SUCCINATE ER 25 MG TABLET,EXTENDED RELEASE 24 HR
12.5000 mg | ORAL_TABLET | Freq: Every day | ORAL | 3 refills | Status: DC
Start: 2021-10-22 — End: 2022-08-28

## 2021-10-22 MED ORDER — VENLAFAXINE ER 37.5 MG CAPSULE,EXTENDED RELEASE 24 HR
37.5000 mg | ORAL_CAPSULE | Freq: Every day | ORAL | 3 refills | Status: DC
Start: 2021-10-22 — End: 2022-10-27

## 2021-10-22 MED ORDER — CYANOCOBALAMIN (VIT B-12) 1,000 MCG/ML ORAL DROPS
1.0000 mL | ORAL | 2 refills | Status: DC
Start: 2021-10-22 — End: 2022-03-19

## 2021-10-22 MED ORDER — NIACIN ER 500 MG TABLET,EXTENDED RELEASE
500.0000 mg | ORAL_TABLET | Freq: Every day | ORAL | 3 refills | Status: DC
Start: 2021-10-22 — End: 2022-12-22

## 2021-10-22 MED ORDER — LEVOTHYROXINE 25 MCG TABLET
25.0000 ug | ORAL_TABLET | Freq: Every day | ORAL | 3 refills | Status: DC
Start: 2021-10-22 — End: 2022-10-27

## 2021-10-22 MED ORDER — CLOPIDOGREL 75 MG TABLET
ORAL_TABLET | ORAL | 3 refills | Status: DC
Start: 2021-10-22 — End: 2022-10-27

## 2021-10-22 MED ORDER — FUROSEMIDE 40 MG TABLET
ORAL_TABLET | ORAL | 3 refills | Status: DC
Start: 2021-10-22 — End: 2022-12-22

## 2021-10-22 MED ORDER — BLOOD-GLUCOSE METER
1.0000 | Freq: Three times a day (TID) | 0 refills | Status: DC
Start: 2021-10-22 — End: 2023-02-24

## 2021-10-22 MED ORDER — DICLOFENAC 1 % TOPICAL GEL
2.0000 g | Freq: Three times a day (TID) | CUTANEOUS | 3 refills | Status: DC | PRN
Start: 2021-10-22 — End: 2023-04-09

## 2021-10-22 NOTE — Telephone Encounter (Signed)
Pharmacy change via pt request    Rochele Pages, LPN  83/16/7425, 52:58

## 2021-10-23 ENCOUNTER — Other Ambulatory Visit (INDEPENDENT_AMBULATORY_CARE_PROVIDER_SITE_OTHER): Payer: Self-pay | Admitting: Family Medicine

## 2021-10-23 DIAGNOSIS — E538 Deficiency of other specified B group vitamins: Secondary | ICD-10-CM

## 2021-10-23 NOTE — Telephone Encounter (Signed)
Pt switching to [facility]. Received fax from pharmacy requesting "syringe 25g 65mL 1inch"  However, patient is not on injectable B12 via med list in chart (she is on oral drops)     Left message with patient to verify how she is receiving/administering her Vit B12. Awaiting call back.

## 2021-10-24 MED ORDER — NEEDLE (DISP) 25 GAUGE X 1"
1.0000 mL | 0 refills | Status: DC
Start: 2021-10-24 — End: 2022-11-06

## 2021-10-24 MED ORDER — CYANOCOBALAMIN (VIT B-12) 1,000 MCG/ML INJECTION SOLUTION
1000.0000 ug | INTRAMUSCULAR | 2 refills | Status: DC
Start: 2021-10-24 — End: 2022-10-27

## 2021-10-24 NOTE — Telephone Encounter (Signed)
Patient states prefers injection of B12.   Attaching prescription.       Rochele Pages, LPN  32/01/3342, 56:86

## 2021-10-24 NOTE — Telephone Encounter (Signed)
Patient notes that she has been doing IM injections of B12 monthly.

## 2021-11-12 ENCOUNTER — Encounter (HOSPITAL_BASED_OUTPATIENT_CLINIC_OR_DEPARTMENT_OTHER): Payer: Self-pay | Admitting: Internal Medicine

## 2021-11-18 ENCOUNTER — Telehealth (INDEPENDENT_AMBULATORY_CARE_PROVIDER_SITE_OTHER): Payer: Self-pay

## 2021-11-18 NOTE — Telephone Encounter (Signed)
Prescreen call completed for upcoming [facility]. Patient denies testing positive for COVID in past 2 weeks. Notified of the visitor policy, mask requirement, and to arrive no more than 10 minutes prior to appointment. Understanding was verbalized.        Renada Cronin, MA

## 2021-11-19 ENCOUNTER — Other Ambulatory Visit (HOSPITAL_BASED_OUTPATIENT_CLINIC_OR_DEPARTMENT_OTHER): Payer: Self-pay

## 2021-11-19 ENCOUNTER — Encounter (INDEPENDENT_AMBULATORY_CARE_PROVIDER_SITE_OTHER): Payer: Commercial Managed Care - PPO

## 2021-11-26 ENCOUNTER — Encounter (INDEPENDENT_AMBULATORY_CARE_PROVIDER_SITE_OTHER): Payer: Commercial Managed Care - PPO | Admitting: Radiology

## 2021-11-26 ENCOUNTER — Ambulatory Visit (INDEPENDENT_AMBULATORY_CARE_PROVIDER_SITE_OTHER): Payer: Commercial Managed Care - PPO | Admitting: NURSE PRACTITIONER

## 2021-11-26 ENCOUNTER — Other Ambulatory Visit: Payer: Self-pay

## 2021-11-26 ENCOUNTER — Encounter (INDEPENDENT_AMBULATORY_CARE_PROVIDER_SITE_OTHER): Payer: Self-pay

## 2021-11-26 VITALS — BP 148/76 | HR 68 | Temp 97.7°F | Resp 18 | Ht 66.0 in | Wt 206.0 lb

## 2021-11-26 DIAGNOSIS — M79672 Pain in left foot: Secondary | ICD-10-CM

## 2021-11-26 DIAGNOSIS — W2209XA Striking against other stationary object, initial encounter: Secondary | ICD-10-CM

## 2021-11-26 DIAGNOSIS — M79675 Pain in left toe(s): Secondary | ICD-10-CM

## 2021-11-26 DIAGNOSIS — F17210 Nicotine dependence, cigarettes, uncomplicated: Secondary | ICD-10-CM

## 2021-11-26 DIAGNOSIS — M7732 Calcaneal spur, left foot: Secondary | ICD-10-CM

## 2021-11-26 NOTE — Progress Notes (Signed)
[address], [facility] URGENT CARE  [address]  [place] [address]    Progress Note    Name: Monique [name] MRN:  [id number]   Date: 11/26/2021 Age: 55 y.o.         Reason for Visit: Foot Injury (Patient reports that she kicked a box with left foot today, 11/26/21; she is now having pain in her left fourth digit into dorsal foot; she is having swelling and bruising; reports that she is unable to bear weight on the foot due to pain; she is NWB in wheelchair at this time.  )    History of Present Illness  Monique [name] is a 55 y.o. female who is being seen today for The above stated symptoms, denies alleviating factors.       Nursing Notes:  There are no exam notes on file for this visit.    Past Medical History:   Diagnosis Date    Anxiety     Carotid stenosis     Chronic back pain     Coronary artery disease involving native coronary artery of native heart     DDD (degenerative disc disease), lumbar     "entire spine"    Ear piercing     GERD (gastroesophageal reflux disease)     H/O complete eye exam     2 years, Dr. [name], at [facility]    History of dental examination     dentures upper and lower    HTN     Hypercholesterolemia     Hyperlipidemia     MI (myocardial infarction) (CMS [facility]) 2012    Neck problem     herniated cervical disc    Obesity (BMI 30-39.9)     Solitary nodule of right lobe of thyroid 03/30/2018    Incidental finding on MRI thoracic spine. [place] ordered.     Stroke (CMS [facility])     2002    Tattoo     Left breast, Right shoulder    Thyroid disorder     Thyroid nodule     Uncontrolled type 2 diabetes mellitus, without long-term current use of insulin     Xanthelasma          Past Surgical History:   Procedure Laterality Date    ENDOMETRIAL ABLATION  1998    HX ANKLE FRACTURE [place]    Right, Doran Durand procedure    HX CATARACT REMOVAL Bilateral 2013    r and l eyes with implants    HX CESAREAN SECTION  06/20/2000    x3, 11/21/86, 11/10/84    HX  CHOLECYSTECTOMY  1988    HX CORONARY ARTERY BYPASS GRAFT  03/31/2011    cardiac, 2 vessel, Dr. [name], [facility]    [place]  2012    x4 stents    HX GASTRIC SLEEVE  02/2017    [place], [place], Dr. [name] HEART CATHETERIZATION  2012    massive heart attack [facility] stents - [facility]    HX THYROID BIOPSY      HX THYROIDECTOMY  101/01/2018    HX TONSILLECTOMY      as child    HX YAG Left 09/20/2013         Current Outpatient Medications   Medication Sig    ACCU-CHEK AVIVA PLUS TEST STRP Strip USE 1 STRIP TO TEST BLOOD SUGAR THREE TIMES A DAY    Blood-Glucose Meter (ACCU-CHEK AVIVA PLUS METER) Misc 1  Kit Three times a day    clopidogreL (PLAVIX) 75 mg Oral Tablet TAKE 1 TABLET BY MOUTH ONCE DAILY    cyanocobalamin (VITAMIN B12) 1,000 mcg/mL Injection Solution Inject 1 mL (1,000 mcg total) under the skin Every 30 days    cyanocobalamin, vitamin B-12, 1,000 mcg/mL Oral Drops Take 1 mL (1,000 mcg total) by mouth Every 7 days    diclofenac sodium (VOLTAREN) 1 % Gel Apply 2 g topically Three times a day as needed    docusate sodium (COLACE) 100 mg Oral Capsule Take 1 Capsule (100 mg total) by mouth Twice daily    empagliflozin (JARDIANCE) 10 mg Oral Tablet Take 10 mg by mouth Once a day    ergocalciferol, vitamin D2, (DRISDOL) 1,250 mcg (50,000 unit) Oral Capsule TAKE 1 CAPSULE BY MOUTH EVERY WEEK    furosemide (LASIX) 40 mg Oral Tablet TAKE 1 TABLET(40 MG) BY MOUTH EVERY DAY    guaiFENesin 100 mg/5 mL Oral Liquid Take 10 mL (200 mg total) by mouth Every 6 hours as needed    HYDROcodone-acetaminophen (NORCO) 10-325 mg Oral Tablet Take 1 Tablet by mouth Every 8 hours as needed for Pain [name] with [facility]    levothyroxine (SYNTHROID) 25 mcg Oral Tablet Take 1 Tablet (25 mcg total) by mouth Once a day    metFORMIN (GLUCOPHAGE) 500 mg Oral Tablet TAKE 1 TABLET BY MOUTH THREE TIMES DAILY WITH MEALS    metoprolol succinate (TOPROL-XL) 25 mg Oral Tablet  Sustained Release 24 hr Take 0.5 Tablets (12.5 mg total) by mouth Once a day    NARCAN 4 mg/actuation Nasal Spray, Non-Aerosol PT STATED SHE HAS NARCAN AT HOME BUT IS NOT TAKING IT    Needle, Disp, 25 G 25 gauge x 1" Needle 1 mL Every 30 days    niacin (NIASPAN) 500 mg Oral Tablet Sustained Release Take 1 Tablet (500 mg total) by mouth Once a day    nitroGLYCERIN (NITROSTAT) 0.4 mg Sublingual Tablet, Sublingual Place 1 Tablet (0.4 mg total) under the tongue Every 5 minutes as needed for Chest pain for 3 doses over 15 minutes    omeprazole (PRILOSEC) 40 mg Oral Capsule, Delayed Release(E.C.) TAKE 1 CAPSULE BY MOUTH EVERY DAY    ondansetron (ZOFRAN ODT) 8 mg Oral Tablet, Rapid Dissolve DISSOLVE 1 TABLET(8 MG) ON THE TONGUE EVERY 8 HOURS AS NEEDED FOR NAUSEA OR VOMITING    PRENATAL PLUS, CALCIUM CARB, 27 mg iron- 1 mg Oral Tablet Take 1 Tab by mouth Once a day    rosuvastatin (CRESTOR) 40 mg Oral Tablet Take 1 Tablet (40 mg total) by mouth Once a day TAKE 1 TABLET BY MOUTH ONCE DAILY    semaglutide (OZEMPIC) 0.25 mg or 0.5 mg(2 mg/1.5 mL) Subcutaneous Pen Injector Inject 0.5 mg under the skin Every 7 days    venlafaxine (EFFEXOR XR) 37.5 mg Oral Capsule, Sust. Release 24 hr Take 1 Capsule (37.5 mg total) by mouth Once a day    ZYRTEC-D 5-120 mg Oral Tablet Sustained Release 12 hr Take 1 Tablet by mouth Once a day     Allergies   Allergen Reactions    Capron Dm [Pyrilamine-Dextromethorphan]  Other Adverse Reaction (Add comment)     LIGHT HEADED    Tylenol W Codeine [Acetaminophen-Codeine]  Other Adverse Reaction (Add comment)     pt states that one time she had chest pains with this med, but dr. thought it was because she had not eaten     Family  Medical History:     Problem Relation (Age of Onset)    Atrial fibrillation Sister    Colon Cancer Father    Diabetes Mother, Maternal Aunt    Heart Disease Sister, Maternal Grandfather    Hypertension (High Blood Pressure) Mother    Lung Cancer Maternal  Grandfather    MI <63 years of age Mother          Social History     Tobacco Use    Smoking status: Every Day     Packs/day: 0.50     Years: 20.00     Pack years: 10.00     Types: Cigarettes     Start date: 42    Smokeless tobacco: Never   Vaping Use    Vaping Use: Never used   Substance Use Topics    Alcohol use: No    Drug use: No       Review of Systems  Review of Systems   Constitutional: Negative.    Eyes: Negative.    Cardiovascular: Negative.    Gastrointestinal: Negative.    Musculoskeletal: Positive for joint pain.   Neurological: Negative.        Physical Exam:  BP (!) 148/76    Pulse 68    Temp 36.5 C (97.7 F)    Resp 18    Ht 1.676 m (5' 6" )    Wt 93.4 kg (206 lb)    LMP  (LMP Unknown)    SpO2 98%    BMI 33.25 kg/m       Physical Exam  Vitals and nursing note reviewed.   Constitutional:       General: She is not in acute distress.  HENT:      Head: Normocephalic and atraumatic.   Eyes:      Conjunctiva/sclera: Conjunctivae normal.   Pulmonary:      Effort: Pulmonary effort is normal. No respiratory distress.   Musculoskeletal:      Left foot: Normal capillary refill. Swelling, tenderness and bony tenderness present. No foot drop, laceration or crepitus. Normal pulse.      Comments: Ecchymosis , localized swelling, TTP 4th toe left foot  TTP dorsum of foot at the 4th metatarsal   Skin:     General: Skin is warm and dry.   Neurological:      Mental Status: She is alert and oriented to person, place, and time.      Comments: The NV status of the LLE is intact distally   Psychiatric:         Mood and Affect: Affect normal.         Judgment: Judgment normal.         Assessment:    Foot pain, left    Toe pain, left        Plan:  XR FOOT LEFT    Result Date: 11/26/2021  Female, 55 years old. XR FOOT LEFT performed on 11/26/2021 6:42 PM. REASON FOR EXAM:  V03.500: Foot pain, left M79.675: Toe pain, left TECHNIQUE: 3 views/3 images submitted for interpretation. COMPARISON:  Left foot radiographs of  07/25/2015.     FINDINGS/IMPRESSION: No acute fracture. Joint spaces are maintained. Calcaneal enthesophytes. Radiologist location ID: [id number]     Pt takes hydrocodone for back pain, every 8 hours as needed.  The patient was notified of the x ray findings during the clinic visit today.  Ice 20 min on/20 min off  Tylenol for pain and fever.  Orders Placed This Encounter    XR FOOT LEFT       Follow up: No follow-ups on file.    Follow up with PCP.  Seek medical attention for new or worsening symptoms.    This note was partially created using MModal Fluency Direct system (voice recognition software) and is inherently subject to errors including those of syntax and "sound-alike" substitutions which may escape proofreading.  In such instances, original meaning may be extrapolated by contextual derivation.     I saw the patient independently.            Kavin Leech, FNP  11/26/2021, 18:28

## 2021-11-28 ENCOUNTER — Encounter (HOSPITAL_BASED_OUTPATIENT_CLINIC_OR_DEPARTMENT_OTHER): Payer: Commercial Managed Care - PPO | Admitting: Family

## 2021-12-03 ENCOUNTER — Other Ambulatory Visit: Payer: Commercial Managed Care - PPO | Attending: Family

## 2021-12-03 ENCOUNTER — Ambulatory Visit (INDEPENDENT_AMBULATORY_CARE_PROVIDER_SITE_OTHER): Payer: Commercial Managed Care - PPO | Admitting: Family

## 2021-12-03 ENCOUNTER — Other Ambulatory Visit: Payer: Self-pay

## 2021-12-03 ENCOUNTER — Encounter (INDEPENDENT_AMBULATORY_CARE_PROVIDER_SITE_OTHER): Payer: Self-pay

## 2021-12-03 VITALS — BP 118/70 | HR 90 | Ht 66.0 in | Wt 206.0 lb

## 2021-12-03 DIAGNOSIS — I6523 Occlusion and stenosis of bilateral carotid arteries: Secondary | ICD-10-CM

## 2021-12-03 DIAGNOSIS — I6521 Occlusion and stenosis of right carotid artery: Secondary | ICD-10-CM | POA: Insufficient documentation

## 2021-12-03 NOTE — Progress Notes (Addendum)
[place]  [place] [address]    Name: Monique [name] MRN:  [id number]   Date: 12/03/2021 Age: 55 y.o.     Referring Provider: Cherly Beach    Chief Complaint: Follow Up Ultrasound (6 Month US Carotids with F/U)    History of Present Illness:   I had the pleasure of seeing Monique [name] today in follow up for carotid stenosis. She has a known left ICA occlusion since 2012. She has had several TIA's dating back to 2009. The patient is currently asymptomatic and denies any focal neurosensory motor deficits, dysarthria or amaurosis.    Past Surgical History:   Procedure Laterality Date    ENDOMETRIAL ABLATION  1998    HX ANKLE FRACTURE TX  1990s    Right, Doran Durand procedure    HX CATARACT REMOVAL Bilateral 2013    r and l eyes with implants    HX CESAREAN SECTION  06/20/2000    x3, 11/21/86, 11/10/84    HX CHOLECYSTECTOMY  1988    HX CORONARY ARTERY BYPASS GRAFT  03/31/2011    cardiac, 2 vessel, Dr. [name], [facility]    HX CORONARY STENT PLACEMENT  2012    x4 stents    HX GASTRIC SLEEVE  02/2017    [place], [place], Dr. [name] HEART CATHETERIZATION  2012    massive heart attack and stents - [facility]    HX THYROID BIOPSY      HX THYROIDECTOMY  101/01/2018    HX TONSILLECTOMY      as child    HX YAG Left 09/20/2013         Allergies   Allergen Reactions    [place] Dm [Pyrilamine-Dextromethorphan]  Other Adverse Reaction (Add comment)     LIGHT HEADED    Tylenol W Codeine [Acetaminophen-Codeine]  Other Adverse Reaction (Add comment)     pt states that one time she had chest pains with this med, but dr. thought it was because she had not eaten     Patient Active Problem List    Diagnosis    Left leg weakness    Dizziness    Chronic lumbar radiculopathy    Hypertension associated with type 2 diabetes mellitus (CMS HCC)    Type 2 diabetes mellitus with complication, without long-term current use of  insulin (CMS HCC)    Major depressive disorder, recurrent, unspecified (CMS HCC)    Peripheral vascular disease, unspecified (CMS [place])    Fracture of phalanx of finger of right hand    Spinal pain    Type 2 diabetes mellitus with hyperglycemia (CMS [place])    Upper respiratory infection    Thyroid disorder    UDS: 07/28/19    Sinus congestion    Morbid obesity (CMS HCC)    Type 2 diabetes mellitus with hyperosmolarity without coma, without long-term current use of insulin (CMS HCC)    Coronary artery disease involving native coronary artery of native heart    GERD (gastroesophageal reflux disease)    HTN (hypertension)    Hyperlipidemia    Stroke (CMS HCC)    Constipation    Thyroid nodule    Chest pain    DDD (degenerative disc disease), lumbar    Anxiety    Chronic back pain    Carotid stenosis    BMI 32.0-32.9,adult    H/O complete eye exam    S/P CABG x 2  Xanthelasma    MI (myocardial infarction) (CMS HCC)    Hypercholesterolemia     Past Medical History:   Diagnosis Date    Anxiety     Carotid stenosis     Chronic back pain     Coronary artery disease involving native coronary artery of native heart     DDD (degenerative disc disease), lumbar     "entire spine"    Ear piercing     GERD (gastroesophageal reflux disease)     H/O complete eye exam     2 years, Dr. [name], at [facility]    History of dental examination     dentures upper and lower    HTN     Hypercholesterolemia     Hyperlipidemia     MI (myocardial infarction) (CMS [facility]) 2012    Neck problem     herniated cervical disc    Obesity (BMI 30-39.9)     Solitary nodule of right lobe of thyroid 03/30/2018    Incidental finding on MRI thoracic spine. [place] ordered.     Stroke (CMS [facility])     2002    Tattoo     Left breast, Right shoulder    Thyroid disorder     Thyroid nodule     Uncontrolled type 2 diabetes mellitus, without long-term current use of insulin     Xanthelasma          Social History     Tobacco Use     Smoking status: Every Day     Packs/day: 0.50     Years: 20.00     Pack years: 10.00     Types: Cigarettes     Start date: 73    Smokeless tobacco: Never   Substance Use Topics    Alcohol use: No      Current Outpatient Medications   Medication Sig    ACCU-CHEK AVIVA PLUS TEST STRP Strip USE 1 STRIP TO TEST BLOOD SUGAR THREE TIMES A DAY    Blood-Glucose Meter (ACCU-CHEK AVIVA PLUS METER) Misc 1 Kit Three times a day    clopidogreL (PLAVIX) 75 mg Oral Tablet TAKE 1 TABLET BY MOUTH ONCE DAILY    cyanocobalamin (VITAMIN B12) 1,000 mcg/mL Injection Solution Inject 1 mL (1,000 mcg total) under the skin Every 30 days    cyanocobalamin, vitamin B-12, 1,000 mcg/mL Oral Drops Take 1 mL (1,000 mcg total) by mouth Every 7 days    diclofenac sodium (VOLTAREN) 1 % Gel Apply 2 g topically Three times a day as needed    docusate sodium (COLACE) 100 mg Oral Capsule Take 1 Capsule (100 mg total) by mouth Twice daily    empagliflozin (JARDIANCE) 10 mg Oral Tablet Take 1 Tablet (10 mg total) by mouth Once a day    ergocalciferol, vitamin D2, (DRISDOL) 1,250 mcg (50,000 unit) Oral Capsule TAKE 1 CAPSULE BY MOUTH EVERY WEEK    furosemide (LASIX) 40 mg Oral Tablet TAKE 1 TABLET(40 MG) BY MOUTH EVERY DAY    guaiFENesin 100 mg/5 mL Oral Liquid Take 10 mL (200 mg total) by mouth Every 6 hours as needed    HYDROcodone-acetaminophen (NORCO) 10-325 mg Oral Tablet Take 1 Tablet by mouth Every 8 hours as needed for Pain [name] with [facility]    levothyroxine (SYNTHROID) 25 mcg Oral Tablet Take 1 Tablet (25 mcg total) by mouth Once a day    metFORMIN (GLUCOPHAGE) 500 mg Oral Tablet TAKE 1 TABLET BY MOUTH THREE TIMES DAILY  WITH MEALS    metoprolol succinate (TOPROL-XL) 25 mg Oral Tablet Sustained Release 24 hr Take 0.5 Tablets (12.5 mg total) by mouth Once a day    NARCAN 4 mg/actuation Nasal Spray, Non-Aerosol PT STATED SHE HAS NARCAN AT HOME BUT IS NOT TAKING IT    Needle, Disp, 25 G 25 gauge x 1"  Needle 1 mL Every 30 days    niacin (NIASPAN) 500 mg Oral Tablet Sustained Release Take 1 Tablet (500 mg total) by mouth Once a day    nitroGLYCERIN (NITROSTAT) 0.4 mg Sublingual Tablet, Sublingual Place 1 Tablet (0.4 mg total) under the tongue Every 5 minutes as needed for Chest pain for 3 doses over 15 minutes    omeprazole (PRILOSEC) 40 mg Oral Capsule, Delayed Release(E.C.) TAKE 1 CAPSULE BY MOUTH EVERY DAY    ondansetron (ZOFRAN ODT) 8 mg Oral Tablet, Rapid Dissolve DISSOLVE 1 TABLET(8 MG) ON THE TONGUE EVERY 8 HOURS AS NEEDED FOR NAUSEA OR VOMITING    PRENATAL PLUS, CALCIUM CARB, 27 mg iron- 1 mg Oral Tablet Take 1 Tab by mouth Once a day    rosuvastatin (CRESTOR) 40 mg Oral Tablet Take 1 Tablet (40 mg total) by mouth Once a day TAKE 1 TABLET BY MOUTH ONCE DAILY    semaglutide (OZEMPIC) 0.25 mg or 0.5 mg(2 mg/1.5 mL) Subcutaneous Pen Injector Inject 0.5 mg under the skin Every 7 days    venlafaxine (EFFEXOR XR) 37.5 mg Oral Capsule, Sust. Release 24 hr Take 1 Capsule (37.5 mg total) by mouth Once a day    ZYRTEC-D 5-120 mg Oral Tablet Sustained Release 12 hr Take 1 Tablet by mouth Once a day     Family Medical History:     Problem Relation (Age of Onset)    Atrial fibrillation Sister    Colon Cancer Father    Diabetes Mother, Maternal Aunt    Heart Disease Sister, Maternal Grandfather    Hypertension (High Blood Pressure) Mother    Lung Cancer Maternal Grandfather    MI <26 years of age Mother          Review of Systems:  As per HPI.    Physical Examination:  BP 118/70 (Site: Right)    Pulse 90    Ht 1.676 m (5' 6" )    Wt 93.4 kg (206 lb)    LMP  (LMP Unknown)    BMI 33.25 kg/m   GENERAL: The patient is alert and oriented, does not appear to be in any acute distress.  HEENT: Pupils are equal, round and reactive. Extraocular movements are intact.  NEUROLOGICALLY: Cranial nerves II-XII are grossly intact. No motor sensory deficits.  HEART: Regular rate and rhythm. No murmurs present.  LUNGS: Clear to  auscultation bilaterally.  ABDOMEN: Soft, nontender and nondistended.   NECK: No bruits noted over the carotids.  EXTREMITIES: Warm and well perfused.    DIAGNOSIS:  I65.23, Carotid stenosis, bilateral    ASSESSMENT & PLAN:  The patient's ultrasound remains stable with less than 50% stenosis in the right ICA and known occlusion to the left ICA.     She is on good medical therapy; however, does continue to smoke.    We will see the patient back in one year with repeat testing. In the meantime, the patient understands to call with any questions or concerns, and to report to the emergency department should any signs or symptoms of stroke or TIA present.      Khing Belcher, APRN,FNP-BC  I am scribing for, and in the presence of, Oleta Gunnoe, APRN, FNP-BC, for services provided on 12/03/2021.  Terri L. Hertz     I personally performed the services described in this documentation, as scribed  in my presence, and it is both accurate  and complete.    Rosemary Mossbarger, APRN,FNP-BC    On the day of the encounter, a total of  15 minutes was spent on this patient encounter including review of historical information, examination, documentation and post-visit activities.

## 2021-12-11 ENCOUNTER — Ambulatory Visit (INDEPENDENT_AMBULATORY_CARE_PROVIDER_SITE_OTHER): Payer: Commercial Managed Care - PPO | Admitting: Internal Medicine

## 2021-12-11 ENCOUNTER — Other Ambulatory Visit: Payer: Self-pay

## 2021-12-11 ENCOUNTER — Encounter (HOSPITAL_BASED_OUTPATIENT_CLINIC_OR_DEPARTMENT_OTHER): Payer: Self-pay

## 2021-12-11 VITALS — BP 120/60 | HR 85 | Ht 65.0 in | Wt 204.4 lb

## 2021-12-11 DIAGNOSIS — I6523 Occlusion and stenosis of bilateral carotid arteries: Secondary | ICD-10-CM

## 2021-12-11 DIAGNOSIS — I10 Essential (primary) hypertension: Secondary | ICD-10-CM

## 2021-12-11 DIAGNOSIS — Z951 Presence of aortocoronary bypass graft: Secondary | ICD-10-CM

## 2021-12-11 DIAGNOSIS — E785 Hyperlipidemia, unspecified: Secondary | ICD-10-CM

## 2021-12-11 DIAGNOSIS — I2510 Atherosclerotic heart disease of native coronary artery without angina pectoris: Secondary | ICD-10-CM

## 2021-12-11 NOTE — Nursing Note (Deleted)
Patient provided with available cardiac cath dates. She will call back pending her schedule  Marijo Sanes, RN  12/11/2021, 13:52

## 2021-12-11 NOTE — Progress Notes (Signed)
Cardiology Clinic Follow Up Note    Name:  Monique [name]   DOB: 04/30/1966   MRN: [id number]   Date:  12/11/2021     PCP: Cherly Beach, MD     History  Monique [name] is a 56 y.o. female who is here for a follow-up visit.  Patient has coronary artery disease with bypass surgery in 2012 LIMA to LAD and SVG to OM.  Approximately 4 months after surgery the patient had acute myocardial infarction in required percutaneous intervention by Dr. Laurin Coder.  The patient underwent angioplasty of the anastomosis of the IMA graft to the LAD as well as angioplasty stenting the mammary artery and anastomosis site.  The patient had a 2.75 X 12 Promus stent placed in the proximal IMA graft and then a 2.75 X 8 Promus stent in the distal portion of the graft.  A 2.75 X 8 Promus stent was placed at the ostium of the LAD with plaque shifting to the circumflex.  A 3.5 X 8 Promus stent was then deployed in the left main extending into the left circumflex with final kissing balloon angioplasty of the LAD and circumflex.    Last testing:    Stress test 11-2019:  Normal perfusion.  EF 50%  Echocardiogram 11/09/2019: EF 60-65%, trivial aortic regurgitation    Past Medical History:   Diagnosis Date   . Anxiety    . Carotid stenosis    . Chronic back pain    . Coronary artery disease involving native coronary artery of native heart    . DDD (degenerative disc disease), lumbar     "entire spine"   . Ear piercing    . GERD (gastroesophageal reflux disease)    . H/O complete eye exam     2 years, Dr. [name], at [facility]   . History of dental examination     dentures upper and lower   . HTN    . Hypercholesterolemia    . Hyperlipidemia    . MI (myocardial infarction) (CMS [place]) 2012   . Neck problem     herniated cervical disc   . Obesity (BMI 30-39.9)    . 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 [place] ordered.    . Stroke (CMS [facility])     2002   . Tattoo     Left breast, Right shoulder   . Thyroid  disorder    . Thyroid nodule    . Uncontrolled type 2 diabetes mellitus, without long-term current use of insulin    . Xanthelasma            Patient Active Problem List    Diagnosis Date Noted   . Left leg weakness 05/15/2021   . Dizziness 05/15/2021   . Chronic lumbar radiculopathy 05/15/2021   . Hypertension associated with type 2 diabetes mellitus (CMS [place]) 05/15/2021   . Type 2 diabetes mellitus with complication, without long-term current use of insulin (CMS [place]) 05/15/2021   . Major depressive disorder, recurrent, unspecified (CMS [place]) 01/03/2021   . Peripheral vascular disease, unspecified (CMS [place]) 01/03/2021   . Fracture of phalanx of finger of right hand 12/05/2019   . Spinal pain 12/05/2019   . Type 2 diabetes mellitus with hyperglycemia (CMS HCC) 12/05/2019   . Upper respiratory infection 12/05/2019   . Thyroid disorder 09/16/2019   . UDS: 07/28/19 08/02/2019   . Sinus congestion 12/07/2018   . Morbid obesity (CMS  [place]) 09/13/2018   . Type 2 diabetes mellitus with hyperosmolarity without coma, without long-term current use of insulin (CMS HCC)    . Coronary artery disease involving native coronary artery of native heart    . GERD (gastroesophageal reflux disease)    . HTN (hypertension)    . Hyperlipidemia    . Stroke (CMS [facility])      2002     . Constipation 04/15/2018   . Thyroid nodule 04/15/2018   . Chest pain 04/03/2018   . DDD (degenerative disc disease), lumbar      "entire spine"     . Anxiety    . Chronic back pain    . Carotid stenosis    . BMI 32.0-32.9,adult    . H/O complete eye exam    . 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 MI (myocardial infarction) (CMS [place]) 12/08/2010   . Hypercholesterolemia 04/15/2002        Past Surgical History:   Procedure Laterality Date   . ENDOMETRIAL ABLATION  1998   . HX ANKLE FRACTURE TX  1990s    Right, Doran Durand procedure   . HX CATARACT REMOVAL Bilateral 2013    r and l eyes with implants   . HX CESAREAN SECTION  06/20/2000    x3,  11/21/86, 11/10/84   . HX CHOLECYSTECTOMY  1988   . HX CORONARY ARTERY BYPASS GRAFT  03/31/2011    cardiac, 2 vessel, Dr. [name], [facility]   . HX CORONARY STENT PLACEMENT  2012    x4 stents   . HX GASTRIC SLEEVE  02/2017    [place], [place], Dr. Pearlie Oyster   . HX HEART CATHETERIZATION  2012    [facility] st[facility] - [facility]   . HX THYROID BIOPSY     . HX THYROIDECTOMY  101/01/2018   . HX TONSILLECTOMY      as child   . HX YAG Left 09/20/2013            Current Outpatient Medications   Medication Sig   . ACCU-CHEK AVIVA PLUS TEST STRP Strip USE 1 STRIP TO TEST BLOOD SUGAR THREE TIMES A DAY   . Blood-Glucose Meter (ACCU-CHEK AVIVA PLUS METER) Misc 1 Kit Three times a day   . clopidogreL (PLAVIX) 75 mg Oral Tablet TAKE 1 TABLET BY MOUTH ONCE DAILY   . cyanocobalamin (VITAMIN B12) 1,000 mcg/mL Injection Solution Inject 1 mL (1,000 mcg total) under the skin Every 30 days   . cyanocobalamin, vitamin B-12, 1,000 mcg/mL Oral Drops Take 1 mL (1,000 mcg total) by mouth Every 7 days   . diclofenac sodium (VOLTAREN) 1 % Gel Apply 2 g topically Three times a day as needed   . docusate sodium (COLACE) 100 mg Oral Capsule Take 1 Capsule (100 mg total) by mouth Twice daily   . empagliflozin (JARDIANCE) 10 mg Oral Tablet Take 1 Tablet (10 mg total) by mouth Once a day   . ergocalciferol, vitamin D2, (DRISDOL) 1,250 mcg (50,000 unit) Oral Capsule TAKE 1 CAPSULE BY MOUTH EVERY WEEK   . furosemide (LASIX) 40 mg Oral Tablet TAKE 1 TABLET(40 MG) BY MOUTH EVERY DAY   . guaiFENesin 100 mg/5 mL Oral Liquid Take 10 mL (200 mg total) by mouth Every 6 hours as needed (Patient not taking: Reported on 12/11/2021)   . HYDROcodone-acetaminophen (NORCO) 10-325 mg Oral Tablet Take 1 Tablet by mouth Every 8 hours as needed for Pain [name]  with [facility]   . levothyroxine (SYNTHROID) 25 mcg Oral Tablet Take 1 Tablet (25 mcg total) by mouth Once a day   . metFORMIN (GLUCOPHAGE) 500 mg Oral Tablet TAKE 1 TABLET BY MOUTH  THREE TIMES DAILY WITH MEALS   . metoprolol succinate (TOPROL-XL) 25 mg Oral Tablet Sustained Release 24 hr Take 0.5 Tablets (12.5 mg total) by mouth Once a day   . NARCAN 4 mg/actuation Nasal Spray, Non-Aerosol PT STATED SHE HAS NARCAN AT HOME BUT IS NOT TAKING IT   . Needle, Disp, 25 G 25 gauge x 1" Needle 1 mL Every 30 days   . niacin (NIASPAN) 500 mg Oral Tablet Sustained Release Take 1 Tablet (500 mg total) by mouth Once a day   . nitroGLYCERIN (NITROSTAT) 0.4 mg Sublingual Tablet, Sublingual Place 1 Tablet (0.4 mg total) under the tongue Every 5 minutes as needed for Chest pain for 3 doses over 15 minutes   . omeprazole (PRILOSEC) 40 mg Oral Capsule, Delayed Release(E.C.) TAKE 1 CAPSULE BY MOUTH EVERY DAY   . ondansetron (ZOFRAN ODT) 8 mg Oral Tablet, Rapid Dissolve DISSOLVE 1 TABLET(8 MG) ON THE TONGUE EVERY 8 HOURS AS NEEDED FOR NAUSEA OR VOMITING   . PRENATAL PLUS, CALCIUM CARB, 27 mg iron- 1 mg Oral Tablet Take 1 Tab by mouth Once a day   . rosuvastatin (CRESTOR) 40 mg Oral Tablet Take 1 Tablet (40 mg total) by mouth Once a day TAKE 1 TABLET BY MOUTH ONCE DAILY   . semaglutide (OZEMPIC) 0.25 mg or 0.5 mg(2 mg/1.5 mL) Subcutaneous Pen Injector Inject 0.5 mg under the skin Every 7 days   . venlafaxine (EFFEXOR XR) 37.5 mg Oral Capsule, Sust. Release 24 hr Take 1 Capsule (37.5 mg total) by mouth Once a day   . ZYRTEC-D 5-120 mg Oral Tablet Sustained Release 12 hr Take 1 Tablet by mouth Once a day        Allergies   Allergen Reactions   . [place] [Pyrilamine-Dextromethorphan]  Other Adverse Reaction (Add comment)     LIGHT HEADED   . Tylenol W Codeine [Acetaminophen-Codeine]  Other Adverse Reaction (Add comment)     pt states that one time she had chest pains with this med, but dr. thought it was because she had not eaten       Family Medical History:     Problem Relation (Age of Onset)    Atrial fibrillation Sister    Colon Cancer Father    Diabetes Mother, Maternal Aunt    Heart Disease Sister, Maternal  Grandfather    Hypertension (High Blood Pressure) Mother    Lung Cancer Maternal Grandfather    MI <10 years of age Mother            Social History     Tobacco Use   . Smoking status: Every Day     Packs/day: 0.50     Years: 20.00     Pack years: 10.00     Types: Cigarettes     Start date: 20   . Smokeless tobacco: Never   Substance Use Topics   . Alcohol use: No       REVIEW OF SYSTEMS:  10 system review is negative other than that stated in the HPI  Physical Exam  BP 120/60 Comment: LTA  Pulse 85   Ht 1.651 m (_0 )   Wt 92.7 kg (204 lb 6.4 oz)   LMP  (LMP Unknown)   SpO2 99%  BMI 34.01 kg/m       GEN:  Pleasant, conversant and in no acute distress  HEENT: Pupils equal and reactive.  Sclera nonicteric  Neck: No JVD, thyromegaly or bruit  Lungs: Clear bilaterally  CV:  Regular rhythm.  Normal S1-S2.  ABD: Soft and nontender with no appreciable organomegaly  E XT: No cyanosis or edema  NEURO: CN 2-12 intact with no focal defects`  SKIN: No suspicious skin lesions         Assesment and Plan   1. Coronary artery disease.  Patient denies anginal symptoms.  Continue aggressive treatment of underlying cardiovascular risk factors.  Smoking cessation was advised.  Last echocardiogram reported EF 60-65%.  No significant valvular abnormality noted.    2. Carotid artery disease:  Chronic occlusion of the left internal carotid artery.  Mild stenosis of the right internal carotid artery on recent follow-up duplex.  Patient denies TIA or stroke-like symptoms.  Continue aggressive treatment of cardiovascular disease and smoking cessation advised   3. Hypertension:  Blood pressure is still acceptable off of lisinopril and the patient reports resolution of the dizziness and lightheadedness.  Continue to follow with PCP.  4. Obesity: Patient is doing well following gastric sleeve with but nearly 100 lb weight loss.  5. Hyperlipidemia:  Last lipid profile was not optimal with an LDL cholesterol just under 100.  The  patient admits that she has had some dietary changes but does plan on resuming a more cardiac prudent diet and will have her recheck lipid panel in 6 weeks.  Continue to target LDL less than 70.    6. Diabetes mellitus:  Managed by her PCP.  Diabetes is not optimally controlled.  Continue to target A1c less than 7.0  7. Tobacco dependence.  Smoking cessation advised.      Paulita Cradle, MD  12/11/2021, 13:36

## 2021-12-16 ENCOUNTER — Ambulatory Visit: Payer: Commercial Managed Care - PPO | Attending: Orthopaedic Surgery | Admitting: Orthopaedic Surgery

## 2021-12-16 ENCOUNTER — Encounter (HOSPITAL_BASED_OUTPATIENT_CLINIC_OR_DEPARTMENT_OTHER): Payer: Self-pay | Admitting: Orthopaedic Surgery

## 2021-12-16 ENCOUNTER — Inpatient Hospital Stay (HOSPITAL_BASED_OUTPATIENT_CLINIC_OR_DEPARTMENT_OTHER)
Admission: RE | Admit: 2021-12-16 | Discharge: 2021-12-16 | Disposition: A | Discharge status: Home / Self Care | Payer: Commercial Managed Care - PPO | Source: Ambulatory Visit | Admitting: Radiology

## 2021-12-16 ENCOUNTER — Other Ambulatory Visit: Payer: Self-pay

## 2021-12-16 ENCOUNTER — Other Ambulatory Visit (HOSPITAL_BASED_OUTPATIENT_CLINIC_OR_DEPARTMENT_OTHER): Payer: Commercial Managed Care - PPO | Admitting: Radiology

## 2021-12-16 VITALS — BP 142/81 | HR 78 | Temp 96.4°F | Ht 66.58 in | Wt 212.1 lb

## 2021-12-16 DIAGNOSIS — S92912A Unspecified fracture of left toe(s), initial encounter for closed fracture: Secondary | ICD-10-CM | POA: Insufficient documentation

## 2021-12-16 DIAGNOSIS — M25559 Pain in unspecified hip: Secondary | ICD-10-CM | POA: Insufficient documentation

## 2021-12-16 DIAGNOSIS — S99922A Unspecified injury of left foot, initial encounter: Secondary | ICD-10-CM | POA: Insufficient documentation

## 2021-12-16 DIAGNOSIS — M7062 Trochanteric bursitis, left hip: Secondary | ICD-10-CM | POA: Insufficient documentation

## 2021-12-17 ENCOUNTER — Other Ambulatory Visit (INDEPENDENT_AMBULATORY_CARE_PROVIDER_SITE_OTHER): Payer: Commercial Managed Care - PPO | Admitting: Rheumatology

## 2021-12-17 ENCOUNTER — Ambulatory Visit: Payer: Commercial Managed Care - PPO | Attending: Orthopaedic Surgery | Admitting: Orthopaedic Surgery

## 2021-12-17 ENCOUNTER — Encounter (HOSPITAL_BASED_OUTPATIENT_CLINIC_OR_DEPARTMENT_OTHER): Payer: Commercial Managed Care - PPO | Admitting: Family

## 2021-12-17 ENCOUNTER — Encounter (INDEPENDENT_AMBULATORY_CARE_PROVIDER_SITE_OTHER): Payer: Self-pay | Admitting: Orthopaedic Surgery

## 2021-12-17 VITALS — BP 140/75 | HR 87 | Temp 97.5°F | Ht 66.58 in | Wt 212.1 lb

## 2021-12-17 DIAGNOSIS — S90122A Contusion of left lesser toe(s) without damage to nail, initial encounter: Secondary | ICD-10-CM

## 2021-12-17 DIAGNOSIS — Z9229 Personal history of other drug therapy: Secondary | ICD-10-CM

## 2021-12-17 DIAGNOSIS — S92522A Displaced fracture of medial phalanx of left lesser toe(s), initial encounter for closed fracture: Secondary | ICD-10-CM

## 2021-12-17 DIAGNOSIS — E1165 Type 2 diabetes mellitus with hyperglycemia: Secondary | ICD-10-CM | POA: Insufficient documentation

## 2021-12-17 DIAGNOSIS — S92919A Unspecified fracture of unspecified toe(s), initial encounter for closed fracture: Secondary | ICD-10-CM

## 2021-12-17 DIAGNOSIS — S92912G Unspecified fracture of left toe(s), subsequent encounter for fracture with delayed healing: Secondary | ICD-10-CM

## 2021-12-17 LAB — CBC
HCT: 42.7 % (ref 34.8–46.0)
HGB: 14.3 g/dL (ref 11.5–16.0)
MCH: 29.9 pg (ref 26.0–32.0)
MCHC: 33.5 g/dL (ref 31.0–35.5)
MCV: 89.3 fL (ref 78.0–100.0)
MPV: 9.6 fL (ref 8.7–12.5)
PLATELETS: 399 10*3/uL (ref 150–400)
RBC: 4.78 10*6/uL (ref 3.85–5.22)
RDW-CV: 13.4 % (ref 11.5–15.5)
WBC: 9.8 10*3/uL (ref 3.7–11.0)

## 2021-12-17 LAB — COMPREHENSIVE METABOLIC PANEL, NON-FASTING
ALBUMIN: 3.6 g/dL (ref 3.5–5.0)
ALKALINE PHOSPHATASE: 111 U/L (ref 50–130)
ALT (SGPT): 13 U/L (ref 8–22)
ANION GAP: 6 mmol/L (ref 4–13)
AST (SGOT): 13 U/L (ref 8–45)
BILIRUBIN TOTAL: 0.5 mg/dL (ref 0.3–1.3)
BUN/CREA RATIO: 11 (ref 6–22)
BUN: 11 mg/dL (ref 8–25)
CALCIUM: 9.1 mg/dL (ref 8.5–10.0)
CHLORIDE: 105 mmol/L (ref 96–111)
CO2 TOTAL: 26 mmol/L (ref 22–30)
CREATININE: 0.98 mg/dL (ref 0.60–1.05)
ESTIMATED GFR: 67 mL/min/BSA (ref >=60)
GLUCOSE: 200 mg/dL — ABNORMAL HIGH (ref 65–125)
POTASSIUM: 4 mmol/L (ref 3.5–5.1)
PROTEIN TOTAL: 6.6 g/dL (ref 6.4–8.3)
SODIUM: 137 mmol/L (ref 136–145)

## 2021-12-17 LAB — PT/INR
INR: 0.88 (ref 0.80–1.20)
PROTHROMBIN TIME: 10.1 seconds (ref 9.1–13.9)

## 2021-12-17 LAB — PTT (PARTIAL THROMBOPLASTIN TIME): APTT: 31.2 seconds (ref 24.2–37.5)

## 2021-12-17 NOTE — Progress Notes (Signed)
Monique  [place], [name] [address]  O: [phone]  F: [phone]       [facility]     PATIENT NAME:  Monique [name] RECORD NUMBER: [id number]    Chief Complaint      left toe pain    History of Present Illness      Monique [name] is a very pleasant 55 y.o. female who presents today as a new patient for left toe complaints.  She reports injury on 11/26/21.  She kicked a box and the 4th toe became swollen and painful.  She was seen at [facility] the next day and no fracture was identified on x-ray.  She experienced increased pain so she was evaluated at the [facility] emergency department where 4th middle phalanx fracture was identified.  She was placed in a post-op shoe.  She has been WBAT in post-op shoe, however she did not wear the shoe to clinic today.  She was referred to our clinic by Dr. Mendel Ryder who was concerned that she would need surgical management of the fracture.        She reports continued pain at the 4th toe which is worse with walking.  She reports ecchymosis and swelling.  Overall, she reports no improvement in her condition.  She is taking 50,000 units of Vitamin D weekly.      She has had the following related foot/ankle surgery: ORIF Jones fracture (right).    She smokes 1/2 pack of cigarettes per day.    She reports a history of diabetes mellitus which she takes Ozempic and Metformin for.  She believes her last A1C was 8.0.  She reports history of myocardial infarction, open heart surgery, and stent placement.  She is currently taking Plavix and 81 mg ASA daily.  She denies a history of rheumatoid arthritis.      Past Medical/Surgical History     Past Medical History:   Diagnosis Date   . Anxiety    . Carotid stenosis    . Chronic back pain    . Coronary artery disease involving native coronary artery of native heart    . DDD  (degenerative disc disease), lumbar     "entire spine"   . Ear piercing    . GERD (gastroesophageal reflux disease)    . H/O complete eye exam     2 years, Dr. [name], at [facility]   . History of dental examination     dentures upper and lower   . HTN    . Hypercholesterolemia    . Hyperlipidemia    . MI (myocardial infarction) (CMS [place]) 2012   . Neck problem     herniated cervical disc   . Obesity (BMI 30-39.9)    . 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 [place] ordered.    . Stroke (CMS [facility])     2002   . Tattoo     Left breast, Right shoulder   . Thyroid disorder    . Thyroid nodule    . Uncontrolled type 2 diabetes mellitus, without long-term current use of insulin    . Xanthelasma             Past Surgical History:   Procedure Laterality Date   . ENDOMETRIAL ABLATION  [place]    Right, Doran Durand procedure   . HX CATARACT REMOVAL Bilateral 2013    r and l eyes with implants   . HX CESAREAN SECTION  06/20/2000    x3, 11/21/86, 11/10/84   . HX CHOLECYSTECTOMY  1988   . HX CORONARY ARTERY BYPASS GRAFT  03/31/2011    cardiac, 2 vessel, Dr. [name], [facility]   . HX CORONARY STENT PLACEMENT  2012    x4 stents   . HX GASTRIC SLEEVE  02/2017    [place], Dr. Pearlie Oyster   . HX HEART CATHETERIZATION  2012    massive heart attack and stents - [facility]   . HX THYROID BIOPSY     . HX THYROIDECTOMY  101/01/2018   . HX TONSILLECTOMY      as child   . HX YAG Left 09/20/2013              Past Family and Social History     Family Medical History:     Problem Relation (Age of Onset)    Atrial fibrillation Sister    Colon Cancer Father    Diabetes Mother, Maternal Aunt    Heart Disease Sister, Maternal Grandfather    Hypertension (High Blood Pressure) Mother    Lung Cancer Maternal Grandfather    MI <28 years of age Mother            Social History     Socioeconomic History   . Marital status: Married     Spouse name: Monique [name]   . Number of children: 3   .  Years of education: completed 11th grade   . Highest education level: Not on file   Occupational History   . Occupation: disabled   Tobacco Use   . Smoking status: Every Day     Packs/day: 0.50     Years: 20.00     Pack years: 10.00     Types: Cigarettes     Start date: 29   . Smokeless tobacco: Never   Vaping Use   . Vaping Use: Never used   Substance and Sexual Activity   . Alcohol use: No   . Drug use: No   . Sexual activity: Yes     Partners: Male   Other Topics Concern   . Abuse/Domestic Violence Not Asked   . Breast Self Exam Not Asked   . Caffeine Concern Not Asked   . Calcium intake adequate Not Asked   . Computer Use Not Asked   . Drives Not Asked   . Exercise Concern Not Asked   . Helmet Use Not Asked   . Seat Belt Not Asked   . Special Diet Not Asked   . Sunscreen used Not Asked   . Uses Cane Not Asked   . Uses walker Not Asked   . Uses wheelchair Not Asked   . Right hand dominant Not Asked   . Left hand dominant Not Asked   . Ambidextrous Not Asked   . Shift Work Not Asked   . Unusual Sleep-Wake Schedule Not Asked   . Ability to Walk 1 Flight of Steps without SOB/CP Not Asked   . Routine Exercise No   . Ability to Walk 2 Flight of Steps without SOB/CP Yes   . Unable to Ambulate Not Asked   . Total Care Not Asked   . Ability To Do Own ADL's Not Asked   .  Uses Walker Not Asked   . Other Activity Level Not Asked   . Uses Cane Not Asked   Social History Narrative    Living situation: lives at home with husband, Monique [name], and daughter.  1 dog named Duke and 1 cat named Lucky.    Nutrition:  Eats 5 small meals d/t gastric sleeve surgery.     Caffeine use: none, decaf coffee    Exercise: stays busy around house. No formal form of exercise    Seatbelt use: sometimes    Fire extinguishers in the home: yes    Smoke alarms in the home: yes    Carbon monoxide detectors in the home: yes    Monique [name] exposure: sunglasses        [name] would like for husband, Monique [name],  to speak for her in the event she would become  incapacitated.    Full code.      Social Determinants of Health     Financial Resource Strain: Not on file   Food Insecurity: Not on file   Transportation Needs: Not on file   Physical Activity: Not on file   Stress: Not on file   Intimate Partner Violence: Not on file   Housing Stability: Not on file        Medications and Allergies     Home Medications:  Current Outpatient Medications   Medication Instructions   . ACCU-CHEK AVIVA PLUS TEST STRP Strip USE 1 STRIP TO TEST BLOOD SUGAR THREE TIMES A DAY   . Blood-Glucose Meter (ACCU-CHEK AVIVA PLUS METER) Misc 1 Kit, Does not apply, 3 TIMES DAILY   . clopidogreL (PLAVIX) 75 mg Oral Tablet TAKE 1 TABLET BY MOUTH ONCE DAILY   . cyanocobalamin (vitamin B-12) 1,000 mcg, Oral, EVERY 7 DAYS   . cyanocobalamin (VITAMIN B12) 1,000 mcg, Subcutaneous, EVERY 30 DAYS   . diclofenac sodium (VOLTAREN) 2 g, Apply Topically, 3 TIMES DAILY PRN   . docusate sodium (COLACE) 100 mg, Oral, 2 TIMES DAILY   . empagliflozin (JARDIANCE) 10 mg, Oral, DAILY   . ergocalciferol, vitamin D2, (DRISDOL) 1,250 mcg (50,000 unit) Oral Capsule TAKE 1 CAPSULE BY MOUTH EVERY WEEK   . furosemide (LASIX) 40 mg Oral Tablet TAKE 1 TABLET(40 MG) BY MOUTH EVERY DAY   . guaiFENesin 200 mg, Oral, EVERY 6 HOURS PRN   . HYDROcodone-acetaminophen (NORCO) 10-325 mg Oral Tablet 1 Tablet, Oral, EVERY 8 HOURS PRN, [name] with [facility]   . levothyroxine (SYNTHROID) 25 mcg, Oral, DAILY   . metFORMIN (GLUCOPHAGE) 500 mg Oral Tablet TAKE 1 TABLET BY MOUTH THREE TIMES DAILY WITH MEALS   . metoprolol succinate (TOPROL-XL) 12.5 mg, Oral, DAILY   . NARCAN 4 mg/actuation Nasal Spray, Non-Aerosol PT STATED SHE HAS NARCAN AT HOME BUT IS NOT TAKING IT   . Needle, Disp, 25 G 25 gauge x 1" Needle 1 mL, Does not apply, EVERY 30 DAYS   . niacin (NIASPAN) 500 mg, Oral, DAILY   . nitroGLYCERIN (NITROSTAT) 0.4 mg, Sublingual, EVERY 5 MIN PRN, for 3 doses over 15 minutes   . omeprazole (PRILOSEC) 40 mg Oral  Capsule, Delayed Release(E.C.) TAKE 1 CAPSULE BY MOUTH EVERY DAY   . ondansetron (ZOFRAN ODT) 8 mg Oral Tablet, Rapid Dissolve DISSOLVE 1 TABLET(8 MG) ON THE TONGUE EVERY 8 HOURS AS NEEDED FOR NAUSEA OR VOMITING   . Ozempic 0.5 mg, Subcutaneous, EVERY 7 DAYS   . PRENATAL PLUS, CALCIUM CARB, 27 mg iron- 1 mg Oral Tablet  1 Tablet, Oral, DAILY   . rosuvastatin (CRESTOR) 40 mg, Oral, DAILY, TAKE 1 TABLET BY MOUTH ONCE DAILY   . venlafaxine (EFFEXOR XR) 37.5 mg, Oral, DAILY   . ZYRTEC-D 5-120 mg Oral Tablet Sustained Release 12 hr 1 Tablet, Oral, DAILY       Allergies:  Allergies   Allergen Reactions   . Ane Payment [Pyrilamine-Dextromethorphan]  Other Adverse Reaction (Add comment)     LIGHT HEADED   . Tylenol W Codeine [Acetaminophen-Codeine]  Other Adverse Reaction (Add comment)     pt states that one time she had chest pains with this med, but dr. thought it was because she had not eaten       Review of Systems     A 14-point comprehensive ROS is obtained from the patient today by my  and personally reviewed by myself.  It is documented in a separate note which includes the pertinent positives.  All other systems are negative.    This ROS includes the following systems: Constitutional, HEENT, Eyes, Respiratory, Cardiovascular, GastrointestinaI, Endocrine, Genitourinary, Musculoskeletal, Allergy/Immuno, Neurological, Hematologic, Behavorial, and Skin.      Physical Examination + Relevant Imaging/Labs/Other Studies     Ht:1.691 m (5' 6.58") Wt:96.2 kg (212 lb 1.3 oz) BMI: Body mass index is 33.64 kg/m.    General/Constitutional: no apparent distress, well-nourished, well-developed  Eyes: pupils are equal, round, with synchronous movement; sclera white  Lymphatic: no palpable adenopathy  Respiratory: non-labored breathing, symmetric rise of chest wall  Vascular: no edema, cyanosis, varicosities, or swelling, except as noted in exam  Integumentary: no impressive skin lesions present; skin has normal pigmentation,  color, hair distribution, texture, and temperature except as noted in exam  Neurological:  Oriented to person, place, and time.  Psychological:  Normal mood and affect.  Musculoskeletal: normal, except as noted in exam      Foot/Ankle exam       RIGHT LEFT   GAIT: Normal Antalgic        STANDING ALIGNMENT:     Ankle/Hindfoot/Midfoot Deformity none acute none acute   Forefoot Deformity none none        SEATED EXAM:     Skin normal discoloration ecchymosis   Nails normal- no clubbing or nail changes normal- no clubbing or nail changes   Effusion none none   Swelling none moderate 4th toe   Warmth No warmth No warmth   Crepitation none none   Tenderness Ankle - none  Foot - none  Toes - none Ankle - none  Foot - moderate 4th metatarsal  Toes - severe 4th toe        ROM:     Ankle Dorsiflexion 20 degrees, Painless 20 degrees, Painless   Ankle Plantarflexion 40 degrees, Painless 40 degrees, Painless   Subtalar Joint Inversion 30 degrees, Painless 30 degrees, Painless   Subtalar Joint Eversion 15 degrees, Painless 15 degrees, Painless   Transverse Tarsal Joints Abduction Painless Painless   Transverse Tarsal Joints Adduction Painless Painless        STRENGTH:     Atrophy none none   Abnormal Movements none none   Anterior Tibial Muscle 5/5 5/5   Posterior Tibial Muscle 5/5 5/5   Peroneals 5/5 5/5   Gastroc-soleus Muscle 5/5 5/5   Single Heel Raise Able deferred        STABILITY: No instability No instability        SENSATION:     Sural Nerve Dist. intact intact   Saphenous New  Dist. intact intact   Deep Peroneal Nerve Dist. intact intact   Superficial Peroneal Nerve Dist. intact intact        VASCULAR EXAM:     Varicosities none none   Capillary refill < 3 seconds/normal < 3 seconds/normal   DP Artery pulse Palpable Palpable   PT Artery pulse Palpable Palpable        Equinus contracture present? Present Present   Silfverskiold testing is Silfverskiold testing: negative for any significant contracture Silfverskiold  testing: negative for any significant contracture                  Imaging   Radiographs:  3V WBof the left 4th toe dated 12/17/2021 are ordered, obtained, and independently reviewed/interpreted by myself and Dr. Iris Pert today and reveal intra-articular 4th middle phalanx fracture.      Other Studies, Imaging, Lab Testing   n/a      Assessment and Plan     1. Phalanx of the foot fracture    2. Hx of long-term (current) use of anticoagulants    3. Personal history of other drug therapy    4. Type 2 diabetes mellitus with hyperglycemia, unspecified whether long term insulin use (CMS [facility])    5. Contusion of left lesser toe(s) without damage to nail, initial encounter        We discussed x-ray findings and diagnosis of 4th middle phalanx fracture.  Fracture is at the joint level.  We discussed that non-operative treatment is a possibility, however, due to the intra-articular nature of the fracture, we recommend surgery.  We discussed fusion vs pinning.  We discussed that diabetes and smoking increase risk of surgery.  We discussed that Plavix and ASA would have to be stopped 5-7 days prior to surgery.  After surgery she would be WBAT in a post-op shoe.      We recommend smoking cessation as it has adverse effects on bone healing and increases surgical risks.  Additionally we recommend control of diabetes.  We recommend that she see call her cardiologist regarding stopping Plavix and to obtain clearance for surgery.  We will plan for surgery about one week from today pending cardiology recommendations.         We discussed the diagnosis, alternative treatment options (and the risks/benefits of such), risks and benefits of the above surgical intervention, and all questions/concerns were invited and addressed with the patient/family.  We further reviewed the associated risks and benefits of electing to pursue none of the non-operative or operative treatments.  The patient/family has verbalized a good understanding of the  diagnosis, the planned procedure as above, anticipated post-operative course, overall prognosis, benefits to surgery, and risks, that include, but are not limited to: infection, wound healing issues, stiffness, persisting or worsening pain, numbness, nerve and soft tissue injury, surgery risks: need for further surgery, malunion/nonunion, hardware failure/breakage, potential need for hardware removal, failure of procedure, complications of anesthesia, stroke, blood clot, major cardiopulmonary event, and loss of limb or life.  The patient wishes to proceed with surgical intervention as described and informed consent was obtained.    - She will follow up: for surgery, sooner if needed    Questions and concerns have been invited/addressed and the patient has a good understanding of the diagnosis, treatment plan, goals/expectations, and overall prognosis.    Theone Murdoch, PA-C  12/17/2021, 17:11      I saw and examined the patient.  I directly supervised the PA's activities and procedures.  I  reviewed the PA's note.  I agree with the findings and plan of care as documented in the PA's note.    On the day of the encounter, a total of  45  minutes was spent on this patient encounter including review of historical information, examination, documentation and post-visit activities.       Arlyss Repress, MD  Assistant Professor-   Foot & Ankle Surgery  Department of [place] [facility]

## 2021-12-17 NOTE — Progress Notes (Signed)
PATIENT NAME: Sligar, [place] NUMBER:  [id number]  DATE OF SERVICE: 12/16/2021  DATE OF BIRTH:  02-22-1966    PROGRESS NOTE    CHIEF COMPLAINT:  Left hip pain.    HISTORY OF PRESENT ILLNESS:  The patient is a 56 year old female who is here today.  She was told several years ago that she would need a left hip replacement.  She states that the hip has given her problems sleeping at night, which she has difficulty on the lateral aspect of her hip.  She denies having any history of surgery to that hip.  She did have some x-rays most recently on August 31, 2019, for which she will have reviewed today.  The patient admits that on November 26, 2021, she kicked a box in her hallway by accident.  She went to [facility] where she had x-rays performed.  She then went to [place] due to the top of her foot being black and blue and painful.  They gave her a hard-soled shoe for which she had been wearing up until today because of the bad weather.  The patient states that her foot and toe is very, very painful and wanted to discuss this with Dr. Mendel Ryder today as well.    PAST MEDICAL HISTORY:  Diabetes and history of open heart surgery.    PAST SURGICAL HISTORY:  Three C sections, open heart surgery, ankle surgery, gallbladder surgery, and eye laser implants.    MEDICATIONS:  1. Plavix.  2. Vitamin B12.  3. Voltaren gel.  4. Colace.  5. Jardiance.  6. Vitamin D2.   7. Lasix.  8. Norco 10/325.   9. Levothyroxine 25 mcg.  10. Metformin.  11. Metoprolol.  12. Niaspan 500 daily.  13. Nitroglycerin sublingual as needed for chest pain.  14. Omeprazole 40 mg.  15. Zofran 8 mg.  16. Prenatal Plus daily.  17. Crestor 40 mg.  18. Ozempic 0.25 or 0.5 subcutaneous pen injector every 7 days.    19. Effexor XR 37.5 mg daily.  20. Zyrtec D 1 tablet daily.    ALLERGIES:  1. CAPRON DM, causes lightheadedness.   2. TYLENOL WITH CODEINE caused chest pains.      She has no latex allergy.    SOCIAL HISTORY:  The patient lives at  home with her husband.  She does not work outside the home.  She does smoke.  She does not chew tobacco.  She does not drink alcohol.    FAMILY HISTORY:  Diabetes, ears, nose, and throat problems, cancer and heart problems.    REVIEW OF SYSTEMS:  Positive for heartburn, positive for joint and muscle pains.  All other review of systems was otherwise negative.    PHYSICAL EXAMINATION:  She is a 56 year old female in no acute distress.  Vital Signs:  Blood pressure 142/81, pulse 78, temperature 35.8 degrees Celsius, weight 96.2 kg, height 169.1 cm.  Head was atraumatic, normocephalic.  She had normal inspiratory and expiratory excursion of the lungs.  Left hip on exam showed that she was moderately tender to palpation by examination with Dr. Mendel Ryder over her troch bursa.  She had no pain with hip flexion or external rotation.  She does have some pain with internal rotation.  She was painful with cross-body in which she had adduction performed.  The patient was able to tolerate intact knee range of motion and ankle range of motion.  Her left foot examination showed that she has a swollen fourth  toe on her left foot that is also tender to the touch.  Neurovascular status was intact.    IMAGING:  She was sent for x-rays today of her left hip joint center series as well as her left fourth toe.  Her left hip x-ray showed minimal signs of hip arthritis.  Dr. Mendel Ryder felt that the vast majority of her pain symptoms on her left hip was coming from left hip trochanteric bursitis.  Her left fourth toe x-rays showed a significant fourth toe fracture.  Dr. Mendel Ryder contacted Dr. Iris Pert, our foot and ankle specialist, who advised he could see her in his clinic tomorrow to discuss surgical intervention.  The patient was advised of this and she and her husband will come to see Dr. Iris Pert tomorrow at the [place].  Dr. Mendel Ryder recommended that she continue to wear the hard-soled shoe.  Her current shoe that she is wearing has too much flexion  and given it in and she is going to go back to wearing the hard-soled shoe.    ASSESSMENT AND PLAN:  In regard to her left hip bursitis, we are going to recommend some formal physical therapy.  Dr. Mendel Ryder advised that she would need to continue the exercises on a regular basis even after completing therapy as troch bursitis could very well come back.  If the therapy is not of benefit, she knows to call and contact us as we could potentially provide her with a corticosteroid injection, but she would need to resume those exercises to try to keep it under good control.      In regard to her left fourth toe due to this substantial fracture of that toe, Dr. Mendel Ryder reviewed her images with Dr. Iris Pert and he would like to see her tomorrow in his clinic.  We were able to make arrangements for an appointment for that for her tomorrow as he does feel that surgical intervention will be necessary.  She is going to follow up with [place] on an as-needed basis if her symptoms do not improve regarding her left hip trochanteric bursitis, but from the standpoint of her left hip joint, she otherwise appears to be doing well and we do not feel that any surgical intervention is necessary at this time.  All questions were answered to the best of our ability.     I saw this patient today in attendance with Dr. Cornell Barman.        Luna Glasgow, PA-C  [facility]    Enis Slipper, MD  Associate Professor   [facility] Department of Orthopaedics      I personally saw and examined the patient. See mid-level's note for additional details. My findings are consistant with Trochanteric bursitis of left hip    Hip pain    Toe fracture, left    Toe injury, left, initial encounter.  Enis Slipper, MD  12/18/2021, 17:19          DD:  12/16/2021 18:11:25  DT:  12/17/2021 04:42:22 LL  D#:  [id number]

## 2021-12-18 DIAGNOSIS — M1612 Unilateral primary osteoarthritis, left hip: Secondary | ICD-10-CM

## 2021-12-19 ENCOUNTER — Encounter (INDEPENDENT_AMBULATORY_CARE_PROVIDER_SITE_OTHER): Payer: Self-pay | Admitting: Family Medicine

## 2021-12-19 ENCOUNTER — Ambulatory Visit (INDEPENDENT_AMBULATORY_CARE_PROVIDER_SITE_OTHER): Payer: Commercial Managed Care - PPO | Admitting: Family Medicine

## 2021-12-19 ENCOUNTER — Other Ambulatory Visit: Payer: Commercial Managed Care - PPO | Attending: Family Medicine

## 2021-12-19 ENCOUNTER — Other Ambulatory Visit: Payer: Self-pay

## 2021-12-19 VITALS — BP 96/60 | HR 90 | Resp 18 | Ht 66.0 in | Wt 210.0 lb

## 2021-12-19 DIAGNOSIS — E119 Type 2 diabetes mellitus without complications: Secondary | ICD-10-CM | POA: Insufficient documentation

## 2021-12-19 DIAGNOSIS — E1100 Type 2 diabetes mellitus with hyperosmolarity without nonketotic hyperglycemic-hyperosmolar coma (NKHHC): Secondary | ICD-10-CM

## 2021-12-19 DIAGNOSIS — I739 Peripheral vascular disease, unspecified: Secondary | ICD-10-CM

## 2021-12-19 DIAGNOSIS — F331 Major depressive disorder, recurrent, moderate: Secondary | ICD-10-CM

## 2021-12-19 LAB — MICROALBUMIN/CREATININE RATIO, URINE, RANDOM
CREATININE RANDOM URINE: 118 mg/dL — ABNORMAL HIGH (ref 50–100)
MICROALBUMIN RANDOM URINE: 0.7 mg/dL
MICROALBUMIN/CREATININE RATIO RANDOM URINE: 5.9 mg/g (ref <30.0)

## 2021-12-19 NOTE — Progress Notes (Signed)
FAMILY MEDICINE, [facility]  [place] [address]       Name: Monique [name] MRN:  [id number]   Date: 12/19/2021 Age: 56 y.o.     Chief complaint: The patient is a 56 y.o. old female who came in today for Diabetes (Last A1C 9.0) and Surgery (Pt is too have surgery on her toe next week.)    HPI:    Diabetes: Last HbA1C was 9.0%.  Last testing was July 2022.  Patient denies symptoms of polydipsia, polyuria, hypoglycemic unawareness, chest pain, dyspnea on exertion, diarrhea, foot ulcerations, paresthesias. Symptoms are not changed. Patient's home blood sugars are poorly controlled. Patient is compliant with medications.  She remains on metformin 500 mg t.i.d., as well as Ozempic, she is on statin therapy, is on aspirin.  She continues to work on diet, see weight curve below.    Surgery:  Patient has planned surgery of the left foot next week.  She has a known 4th middle phalanx fracture.  Status post trauma.  Podiatry has her weight-bearing as tolerated in a postop shoe.  She has been told she will need to stop her Plavix and aspirin 5-7 days prior to surgery.    I review Dr Maximino Greenland most recent encounter and recommendations.  Discussed this with the patient today.      ROS:  Review of systems completed with all positives and pertinent negatives as per HPI.  She reports her depression symptoms are well controlled.  She continues to attempt to lose weight.  Otherwise, negative.    Past medical history:  Past Medical History:   Diagnosis Date   . Anxiety    . Carotid stenosis    . Chronic back pain    . Coronary artery disease involving native coronary artery of native heart    . DDD (degenerative disc disease), lumbar     "entire spine"   . Ear piercing    . GERD (gastroesophageal reflux disease)    . H/O complete eye exam     2 years, Dr. [name], at [facility]   . History of dental examination     dentures upper and lower   . HTN    . Hypercholesterolemia    . Hyperlipidemia    . MI  (myocardial infarction) (CMS [place]) 2012   . Neck problem     herniated cervical disc   . Obesity (BMI 30-39.9)    . 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 [place] ordered.    . Stroke (CMS [facility])     2002   . Tattoo     Left breast, Right shoulder   . Thyroid disorder    . Thyroid nodule    . Uncontrolled type 2 diabetes mellitus, without long-term current use of insulin    . Xanthelasma      Past surgical history:  Past Surgical History:   Procedure Laterality Date   . ENDOMETRIAL ABLATION  1998   . HX ANKLE FRACTURE TX  1990s    Right, Doran Durand procedure   . HX CATARACT REMOVAL Bilateral 2013    r and l eyes with implants   . HX CESAREAN SECTION  06/20/2000    x3, 11/21/86, 11/10/84   . HX CHOLECYSTECTOMY  1988   . HX CORONARY ARTERY BYPASS GRAFT  03/31/2011    cardiac, 2 vessel, Dr. [name], [facility]   . HX CORONARY  STENT PLACEMENT  2012    x4 stents   . HX GASTRIC SLEEVE  02/2017    [place], [place], Dr. Pearlie Oyster   . HX HEART CATHETERIZATION  2012    massive heart attack and stents - [facility]   . HX THYROID BIOPSY     . HX THYROIDECTOMY  101/01/2018   . HX TONSILLECTOMY      as child   . HX YAG Left 09/20/2013     Medications:  Current Outpatient Medications   Medication Sig   . ACCU-CHEK AVIVA PLUS TEST STRP Strip USE 1 STRIP TO TEST BLOOD SUGAR THREE TIMES A DAY   . Blood-Glucose Meter (ACCU-CHEK AVIVA PLUS METER) Misc 1 Kit Three times a day   . clopidogreL (PLAVIX) 75 mg Oral Tablet TAKE 1 TABLET BY MOUTH ONCE DAILY   . cyanocobalamin (VITAMIN B12) 1,000 mcg/mL Injection Solution Inject 1 mL (1,000 mcg total) under the skin Every 30 days   . cyanocobalamin, vitamin B-12, 1,000 mcg/mL Oral Drops Take 1 mL (1,000 mcg total) by mouth Every 7 days   . diclofenac sodium (VOLTAREN) 1 % Gel Apply 2 g topically Three times a day as needed   . docusate sodium (COLACE) 100 mg Oral Capsule Take 1 Capsule (100 mg total) by mouth Twice daily   . empagliflozin (JARDIANCE)  10 mg Oral Tablet Take 1 Tablet (10 mg total) by mouth Once a day   . ergocalciferol, vitamin D2, (DRISDOL) 1,250 mcg (50,000 unit) Oral Capsule TAKE 1 CAPSULE BY MOUTH EVERY WEEK   . furosemide (LASIX) 40 mg Oral Tablet TAKE 1 TABLET(40 MG) BY MOUTH EVERY DAY   . guaiFENesin 100 mg/5 mL Oral Liquid Take 10 mL (200 mg total) by mouth Every 6 hours as needed   . HYDROcodone-acetaminophen (NORCO) 10-325 mg Oral Tablet Take 1 Tablet by mouth Every 8 hours as needed for Pain [name] with [facility]   . levothyroxine (SYNTHROID) 25 mcg Oral Tablet Take 1 Tablet (25 mcg total) by mouth Once a day   . metFORMIN (GLUCOPHAGE) 500 mg Oral Tablet TAKE 1 TABLET BY MOUTH THREE TIMES DAILY WITH MEALS   . metoprolol succinate (TOPROL-XL) 25 mg Oral Tablet Sustained Release 24 hr Take 0.5 Tablets (12.5 mg total) by mouth Once a day   . NARCAN 4 mg/actuation Nasal Spray, Non-Aerosol PT STATED SHE HAS NARCAN AT HOME BUT IS NOT TAKING IT   . Needle, Disp, 25 G 25 gauge x 1" Needle 1 mL Every 30 days   . niacin (NIASPAN) 500 mg Oral Tablet Sustained Release Take 1 Tablet (500 mg total) by mouth Once a day   . nitroGLYCERIN (NITROSTAT) 0.4 mg Sublingual Tablet, Sublingual Place 1 Tablet (0.4 mg total) under the tongue Every 5 minutes as needed for Chest pain for 3 doses over 15 minutes   . omeprazole (PRILOSEC) 40 mg Oral Capsule, Delayed Release(E.C.) TAKE 1 CAPSULE BY MOUTH EVERY DAY   . ondansetron (ZOFRAN ODT) 8 mg Oral Tablet, Rapid Dissolve DISSOLVE 1 TABLET(8 MG) ON THE TONGUE EVERY 8 HOURS AS NEEDED FOR NAUSEA OR VOMITING   . PRENATAL PLUS, CALCIUM CARB, 27 mg iron- 1 mg Oral Tablet Take 1 Tab by mouth Once a day   . rosuvastatin (CRESTOR) 40 mg Oral Tablet Take 1 Tablet (40 mg total) by mouth Once a day TAKE 1 TABLET BY MOUTH ONCE DAILY   . semaglutide (OZEMPIC) 0.25 mg or 0.5 mg(2 mg/1.5 mL) Subcutaneous Pen Injector Inject 0.5  mg under the skin Every 7 days   . venlafaxine (EFFEXOR XR) 37.5 mg Oral  Capsule, Sust. Release 24 hr Take 1 Capsule (37.5 mg total) by mouth Once a day   . ZYRTEC-D 5-120 mg Oral Tablet Sustained Release 12 hr Take 1 Tablet by mouth Once a day     Vitals:    12/19/21 0928   BP: 96/60   Pulse: 90   Resp: 18   SpO2: 98%   Weight: 95.3 kg (210 lb)   Height: 1.676 m (_0 )   BMI: 33.97         Three year weight curve.    Physical Examination:    GENERAL:   Pt is a pleasant, well-nourished, well-developed 56 y.o. female who is in NAD. Appears stated age  22:  head normocephalic, symmetrical facies. EOM intact b/l. PERRLA. Sclera non-icteric, non-injected. upper eyelid w/Xythoma-lipid plaques, diminished in fullness and intensity. Oropharyngeal mucous membranes are moist  w/o erythema/exudates   NECK:   No masses, no lymphadenopathy, no JVD on exam. Trachea midline, no thyromegaly   CV: S1, S2. No murmurs, rubs, gallops.     LUNGS: CTAB, No rhonchi, rales, wheezes.   GI: (+) BS in all 4 quadrants. soft, NT/ND. No rigidity/positive for guarding/negative for rebound. No organomegaly/masses, or abdominal bruits,   MSK: Spontaneous normal AROM of major joints as observed during exam. No joint effusions/ swelling/ deformities.   Fourth digit he of the left lower extremity is swollen and erythematous.  Tender to touch.  No BLE edema, clubbing, or cyanosis.   2+ radial pulse present b/l.   + 2 reflexes Brachioradialis and patellar bilaterally.   Gait normal.  SKIN: No significant lesions, rashes, ecchymoses noted.   NEURO: AAOx4, CN grossly intact. Sensation intact b/l. No focal deficits.  PSYCH: Mood, behavior and affect normal w/ Intact judgement & insight    There are no exam notes on file for this visit.    Diabetes Monitors    CBC  Diff   Lab Results   Component Value Date/Time    WBC 9.8 12/17/2021 02:14 PM    HGB 14.3 12/17/2021 02:14 PM    HCT 42.7 12/17/2021 02:14 PM    PLTCNT 399 12/17/2021 02:14 PM    RBC 4.78 12/17/2021 02:14 PM    MCV 89.3 12/17/2021 02:14 PM    MCHC 33.5 12/17/2021  02:14 PM    MCH 29.9 12/17/2021 02:14 PM    RDW 14.2 05/08/2021 12:25 PM    MPV 9.6 12/17/2021 02:14 PM    Lab Results   Component Value Date/Time    PMNS 47 05/08/2021 12:25 PM    LYMPHOCYTES 40 05/08/2021 12:25 PM    EOSINOPHIL 2 05/08/2021 12:25 PM    MONOCYTES 12 05/08/2021 12:25 PM    BASOPHILS 1 05/08/2021 12:25 PM    BASOPHILS 0.05 05/08/2021 12:25 PM    PMNABS 4.25 05/08/2021 12:25 PM    LYMPHSABS 3.62 05/08/2021 12:25 PM    EOSABS 0.16 05/08/2021 12:25 PM    MONOSABS 1.07 (H) 05/08/2021 12:25 PM    BASOSABS 0.034 04/01/2011 05:08 AM          COMPREHENSIVE METABOLIC PANEL - NON FASTING  Lab Results   Component Value Date    SODIUM 137 12/17/2021    POTASSIUM 4.0 12/17/2021    CHLORIDE 105 12/17/2021    CO2 26 12/17/2021    ANIONGAP 6 12/17/2021    BUN 11 12/17/2021    CREATININE 0.98 12/17/2021  GLUCOSENF 200 (H) 12/17/2021    CALCIUM 9.1 12/17/2021    ALBUMIN 3.6 12/17/2021    TOTALPROTEIN 6.6 12/17/2021    ALKPHOS 111 12/17/2021    AST 13 12/17/2021    ALT 13 12/17/2021       Visit Diagnosis    Encounter Diagnoses   Name Primary?   . Type 2 diabetes mellitus (CMS [place]) Yes   . Moderate episode of recurrent major depressive disorder (CMS [facility])    . Type 2 diabetes mellitus with hyperosmolarity without coma, without long-term current use of insulin (CMS [facility])    . Peripheral vascular disease, unspecified (CMS [place])    . Morbid obesity (CMS [place])      Orders Placed This Encounter   . HGA1C (HEMOGLOBIN A1C WITH EST AVG GLUCOSE)   . MICROALBUMIN/CREATININE RATIO, URINE, RANDOM     Type 2 diabetes mellitus with complication, without long-term current use of insulin (CMS [facility])  Chronic worsening  control  HbA1C has been trending up.  Follow-up with Dr. Alvino Chapel  continue diabetic diet.  Continue to address weight loss which is stalled.  Will change metformin to extended release for diarrhea.    Specific does based on A1c.    She will likely need metformin 1000 mg XR, b.i.d.  Continue Ozempic.  Not currently using  Jardiance.  Stopped Glyxambi 10-5 mg.    Encouraged her to check blood sugars 3-4 times daily.  Discuss renal concerns and need for monitoring.   A1c ordered as above.    Left lower extremity 4th digit fracture  Continue with orthopedics.  Surgery planned for next week.    Will hold anticoagulation for 5 days.      Allergic rhinitis:  Continue histamine blocker.  Will add pseudoephedrine.    Coronary artery disease  Status post CABG  Continue Plavix  Continue beta-blocker  Continue statin.  Continue sublingual nitro p.r.n. for chest pain.  Continue with Cardiology.    Depression, unspecified depression type  Chronic, controlled.  See PHQ above.   Continue Effexor    Weight loss:  Weight loss has stabilized.  Continue behavioral efforts.    GERD  Chronic, Uncontrolled problem   Encouraged lifestyle modification to help with acid reflux, including:   Weight loss   Avoid eating for 2-3 hours prior to bedtime. Avoid late night snacking.    Avoid hot/spicy and acidic foods and overeating.    Remain upright after eating, avoid tight clothing.    Elevate head of bed.   Chew gum or lozenges.   Refrain from alcohol and smoking.   Continue histamine blocker.  Continue  proton pump inhibitor.  Has appointment General surgery for upper GI scope.  She will attend to the emergency room if her symptoms warrant prior to that encounter.    Chronic left-sided lower extremity pain  Stable to improved.  Current diarrhea is felt to be the result of metformin.  Copies of the results are offered to the patient.  Patient with osteoarthritis  Has comorbid peripheral vascular disease  Will increase her statin follow.  Patient has follow-ups with orthopedics for the hip and knee.  Continue follow-up with Dr. Burman Riis in [facility]. for low back pain.    We again discussed use of weight loss as a primary method for pain relief.  Continue on muscle relaxer.  Patient avoids nonsteroidals secondary to her bariatric surgery.  Labs have been  reassuring.    Carotid Art Stenosis:   50% stenosis to the  right ICA  100% stenosis to left ICA.  Continue with Cardiology, Interventional Cardiology  Has had follow-ups with vascular surgery scheduled but has not completed those encounters.    Will follow.    Right facial nevus  Continue follow-up with derm in [place].     OSA:  Could not keep appt for sleep study,   Continue with Neurology,   missed last appointment on 08/05/2021.      Left foot surgery:   Considered low risk surgery.    Patient has significant risk of diabetes.  No known arrhythmia.  No active chest pain.  No active infection.  Patient is acceptable risk for surgery.  Should proceed to the OR after informed consent with surgical provider.  Patient's anticoagulation has been stopped 5 days prior to the procedure.    Return in about 3 months (around 03/19/2022), or if symptoms worsen or fail to improve, for In Person Visit, diabetes mellitus.    Cherly Beach, MD  12/19/2021, 10:13  This note was partially generated using MModal Fluency Direct system, and there may be some incorrect words, spellings, and punctuation that were not noted in checking the note before saving.    PDMP is reviewed today.

## 2021-12-20 ENCOUNTER — Other Ambulatory Visit (INDEPENDENT_AMBULATORY_CARE_PROVIDER_SITE_OTHER): Payer: Self-pay | Admitting: Family Medicine

## 2021-12-20 DIAGNOSIS — E1100 Type 2 diabetes mellitus with hyperosmolarity without nonketotic hyperglycemic-hyperosmolar coma (NKHHC): Secondary | ICD-10-CM

## 2021-12-20 LAB — HGA1C (HEMOGLOBIN A1C WITH EST AVG GLUCOSE): HEMOGLOBIN A1C: 10.4 % — ABNORMAL HIGH (ref <5.7)

## 2021-12-20 MED ORDER — METFORMIN ER 750 MG TABLET,EXTENDED RELEASE 24 HR
750.0000 mg | ORAL_TABLET | Freq: Two times a day (BID) | ORAL | 5 refills | Status: DC
Start: 2021-12-20 — End: 2022-03-19

## 2021-12-20 NOTE — Result Encounter Note (Signed)
Please inform the patient that her labs have returned.      She is not spilling significant amounts of protein in her urine in this is very good news.      Her hemoglobin A1c has risen again to 10.4%.  I have called in metformin extended release to be taken twice a day.  By breaking it up in to twice a day she should have less GI upset.      Will review these results together at our next appointment.      TY TH

## 2021-12-23 ENCOUNTER — Encounter (HOSPITAL_BASED_OUTPATIENT_CLINIC_OR_DEPARTMENT_OTHER): Payer: Self-pay | Admitting: Internal Medicine

## 2021-12-24 ENCOUNTER — Other Ambulatory Visit: Payer: Self-pay

## 2021-12-24 ENCOUNTER — Inpatient Hospital Stay
Admission: RE | Admit: 2021-12-24 | Discharge: 2021-12-24 | Disposition: A | Discharge status: Home / Self Care | Payer: Commercial Managed Care - PPO | Source: Ambulatory Visit

## 2021-12-24 ENCOUNTER — Encounter (HOSPITAL_COMMUNITY): Payer: Self-pay

## 2021-12-24 ENCOUNTER — Inpatient Hospital Stay (HOSPITAL_COMMUNITY)
Admission: RE | Admit: 2021-12-24 | Discharge: 2021-12-24 | Disposition: A | Discharge status: Home / Self Care | Payer: Commercial Managed Care - PPO | Source: Ambulatory Visit

## 2021-12-24 ENCOUNTER — Inpatient Hospital Stay (HOSPITAL_BASED_OUTPATIENT_CLINIC_OR_DEPARTMENT_OTHER)
Admission: RE | Admit: 2021-12-24 | Discharge: 2021-12-24 | Disposition: A | Discharge status: Home / Self Care | Payer: Commercial Managed Care - PPO | Source: Ambulatory Visit

## 2021-12-24 VITALS — BP 125/80 | HR 63 | Temp 96.8°F | Resp 20 | Ht 66.46 in | Wt 189.6 lb

## 2021-12-24 DIAGNOSIS — Z720 Tobacco use: Secondary | ICD-10-CM

## 2021-12-24 DIAGNOSIS — Z01818 Encounter for other preprocedural examination: Secondary | ICD-10-CM | POA: Insufficient documentation

## 2021-12-24 DIAGNOSIS — I2510 Atherosclerotic heart disease of native coronary artery without angina pectoris: Secondary | ICD-10-CM | POA: Insufficient documentation

## 2021-12-24 DIAGNOSIS — I10 Essential (primary) hypertension: Secondary | ICD-10-CM | POA: Insufficient documentation

## 2021-12-24 HISTORY — DX: Presence of aortocoronary bypass graft: Z95.1

## 2021-12-24 HISTORY — DX: Presence of coronary angioplasty implant and graft: Z95.5

## 2021-12-24 HISTORY — DX: Dorsopathy, unspecified: M53.9

## 2021-12-24 HISTORY — DX: Hypothyroidism, unspecified: E03.9

## 2021-12-24 HISTORY — DX: Bariatric surgery status: Z98.84

## 2021-12-24 HISTORY — DX: Unspecified osteoarthritis, unspecified site: M19.90

## 2021-12-24 HISTORY — DX: Long term (current) use of anticoagulants: Z79.01

## 2021-12-24 HISTORY — DX: Heart disease, unspecified: I51.9

## 2021-12-24 HISTORY — DX: Other long term (current) drug therapy: Z79.899

## 2021-12-24 LAB — ECG 12-LEAD (PERFORMED IN PREADMISSION UNIT ONLY)
Atrial Rate: 66 {beats}/min
Calculated P Axis: 56 degrees
Calculated R Axis: -19 degrees
Calculated T Axis: -2 degrees
PR Interval: 168 ms
QRS Duration: 88 ms
QT Interval: 430 ms
QTC Calculation: 450 ms
Ventricular rate: 66 {beats}/min

## 2021-12-24 LAB — POC BLOOD GLUCOSE (RESULTS): GLUCOSE, POC: 238 mg/dl — ABNORMAL HIGH (ref 70–105)

## 2021-12-24 NOTE — Anesthesia Preprocedure Evaluation (Addendum)
ANESTHESIA PRE-OP Monique [name]  Planned Procedure: OPEN REDUCTION INTERNAL FIXATION FRACTURE MIDFOOT TOE METARSAL (Left)  Review of Systems     anesthesia history negative               Pulmonary   current smoker,   Cardiovascular    Hypertension, well controlled, past MI, CAD, Carotid stenosis: chronic occlusion left ICA. Follows with Vascular., CABG (2012), cardiac stents (x4) and hyperlipidemia ,No peripheral edema,  Exercise Tolerance: > or = 4 METS   ,beta blocker therapy      GI/Hepatic/Renal    GERD and well controlled        Endo/Other    hypothyroidism, obesity and drug induced coagulopathy,   type 2 diabetes (HGA1C 10.4 on 12/19/21. on jardiance and ozempic)/ poorly controlled/ controlled with oral medications    Neuro/Psych/MS    Takes Norco TID, CVA, back abnormality     Cancer    negative hematology/oncology ROS,                 Physical Assessment      Airway       Mallampati: II    TM distance: >3 FB    Neck ROM: full  Mouth Opening: good.      No endotracheal tube present  No Tracheostomy present    Dental           (+) edentulous           Pulmonary    Breath sounds clear to auscultation  (-) no rhonchi, no decreased breath sounds, no wheezes, no rales and no stridor     Cardiovascular    Rhythm: regular  Rate: Normal  (+) carotid bruit is present (left)   (-) no friction rub, no peripheral edema and no murmur     Other findings            Plan  ASA 4     Planned anesthesia type: MAC                     Intravenous induction     Anesthesia issues/risks discussed are: Post-op Cognitive Dysfunction, Intraoperative Awareness/ Recall, Sore Throat, Cardiac Events/MI, Post-op Pain Management, PONV and Stroke.  Anesthetic plan and risks discussed with patient             Patient's NPO status is appropriate for Anesthesia.           Plan discussed with CRNA.    (Pt denies acute changes in cardiopulmonary status.  No DOE, no CP with activity.  )                ECHO  11/09/2019  Conclusions:  1. The left ventricle is small. Normal left ventricular ejection fraction. LV Ejection Fraction is 60-65 %.  Concentric remodeling.  2. Resting Segmental Wall Motion Analysis: Total wall motion score is 1.00. There are no regional wall  motion abnormalities.  3. Trace aortic valve regurgitation. No Aortic Valve Stenosis.        Stress Test 11/09/2019  Interpretation Summary  .  regadenoson ECG portion of the study.  .  Post-stress ejection fraction was 50 %.    Lexiscan EKG:  =============  1. Appropriate hemodynamics response to Lexiscan.  2. No significant ST-T changes on EKG after injection of Lexiscan.    Nuclear Perfusion imaging   ========================  1. Myocardial perfusion imaging study is normal.   2. There was no perfusion defect suggestive  of ischemia or infarction.  3. Gated SPECT imaging reveals normal myocardial thickening and wall motion.  4. The left ventricular ejection fraction is  preserved, EF is calculated to be 50%.           Carotid Duplex 05/21/21  Conclusions: Right internal carotid artery with mild stenosis. 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   Recommendations: A 12 month followup is recommended.        EKG Ordered: 12/24/2021  CXR Ordered:  12/24/2021              Latest Reference Range & Units 12/17/21 14:14 12/19/21 11:18 12/19/21 11:25   WBC 3.7 - 11.0 x10^3/uL 9.8     HGB 11.5 - 16.0 g/dL 14.3     HCT 34.8 - 46.0 % 42.7     PLATELET COUNT 150 - 400 x10^3/uL 399     RBC 3.85 - 5.22 x10^6/uL 4.78     MCV 78.0 - 100.0 fL 89.3     MCHC 31.0 - 35.5 g/dL 33.5     MCH 26.0 - 32.0 pg 29.9     RDW-CV 11.5 - 15.5 % 13.4     MPV 8.7 - 12.5 fL 9.6     PROTHROMBIN TIME 9.1 - 13.9 seconds 10.1     INR 0.80 - 1.20  0.88     aPTT 24.2 - 37.5 seconds 31.2     SODIUM 136 - 145 mmol/L 137     POTASSIUM 3.5 - 5.1 mmol/L 4.0     CHLORIDE 96 - 111 mmol/L 105     CARBON DIOXIDE 22 - 30 mmol/L 26     BUN 8 - 25 mg/dL 11     CREATININE 0.60 - 1.05 mg/dL 0.98      GLUCOSE 65 - 125 mg/dL 200 (H)     ANION GAP 4 - 13 mmol/L 6     BUN/CREAT RATIO 6 - 22  11     ESTIMATED GLOMERULAR FILTRATION RATE >=60 mL/min/BSA 67     CALCIUM 8.5 - 10.0 mg/dL 9.1     TOTAL PROTEIN 6.4 - 8.3 g/dL 6.6     ALBUMIN 3.5 - 5.0 g/dL  3.6     BILIRUBIN, TOTAL 0.3 - 1.3 mg/dL 0.5     AST (SGOT) 8 - 45 U/L 13     ALT (SGPT) 8 - 22 U/L 13     ALKALINE PHOSPHATASE 50 - 130 U/L 111     HEMOGLOBIN A1C <5.7 %  10.4 (H)    MICROALBUMIN/CREAT RATIO <30.0 mg/g   5.9   CREATININE, UR RAND 50 - 100 mg/dL   118 (H)   MICROALBUMIN RANDOM URINE No reference intervals are established mg/dL   0.7   (H): Data is abnormally high          Vascular Surgery progress note 12/03/21  History of Present Illness:   I had the pleasure of seeing Monique [name] today in follow up for carotid stenosis. She has a known left ICA occlusion since 2012. She has had several TIA's dating back to 2009. The patient is currently asymptomatic and denies any focal neurosensory motor deficits, dysarthria or amaurosis.  ASSESSMENT & PLAN:  The patient's ultrasound remains stable with less than 50% stenosis in the right ICA and known occlusion to the left ICA.     She is on good medical therapy; however, does continue to smoke.    We will see the patient back in one year with repeat  testing. In the meantime, the patient understands to call with any questions or concerns, and to report to the emergency department should any signs or symptoms of stroke or TIA present.  Monique [place], APRN,FNP-BC            Cardiology progress note 12/11/2021   Assesment and Plan   1. Coronary artery disease.  Patient denies anginal symptoms.  Continue aggressive treatment of underlying cardiovascular risk factors.  Smoking cessation was advised.  Last echocardiogram reported EF 60-65%.  No significant valvular abnormality noted.    2. Carotid artery disease:  Chronic occlusion of the left internal carotid artery.  Mild stenosis of the right internal carotid artery  on recent follow-up duplex.  Patient denies TIA or stroke-like symptoms.  Continue aggressive treatment of cardiovascular disease and smoking cessation advised   3. Hypertension:  Blood pressure is still acceptable off of lisinopril and the patient reports resolution of the dizziness and lightheadedness.  Continue to follow with PCP.  4. Obesity: Patient is doing well following gastric sleeve with but nearly 100 lb weight loss.  5. Hyperlipidemia:  Last lipid profile was not optimal with an LDL cholesterol just under 100.  The patient admits that she has had some dietary changes but does plan on resuming a more cardiac prudent diet and will have her recheck lipid panel in 6 weeks.  Continue to target LDL less than 70.    6. Diabetes mellitus:  Managed by her PCP.  Diabetes is not optimally controlled.  Continue to target A1c less than 7.0  7. Tobacco dependence.  Smoking cessation advised.  Monique Cradle, MD  12/11/2021, 13:36              Orthopaedics note 12/17/21:  Assessment and Plan     1. Phalanx of the foot fracture    2. Hx of long-term (current) use of anticoagulants    3. Personal history of other drug therapy    4. Type 2 diabetes mellitus with hyperglycemia, unspecified whether long term insulin use (CMS HCC)    5. Contusion of left lesser toe(s) without damage to nail, initial encounter      We discussed x-ray findings and diagnosis of 4th middle phalanx fracture.  Fracture is at the joint level.  We discussed that non-operative treatment is a possibility, however, due to the intra-articular nature of the fracture, we recommend surgery.  We discussed fusion vs pinning.  We discussed that diabetes and smoking increase risk of surgery.  We discussed that Plavix and ASA would have to be stopped 5-7 days prior to surgery.  After surgery she would be WBAT in a post-op shoe.      We recommend smoking cessation as it has adverse effects on bone healing and increases surgical risks.  Additionally we  recommend control of diabetes.  We recommend that she see call her cardiologist regarding stopping Plavix and to obtain clearance for surgery.  We will plan for surgery about one week from today pending cardiology recommendations.         We discussed the diagnosis, alternative treatment options (and the risks/benefits of such), risks and benefits of the above surgical intervention, and all questions/concerns were invited and addressed with the patient/family. We further reviewed the associated risks and benefits of electing to pursue none of the non-operative or operative treatments. The patient/family has verbalized a good understanding of the diagnosis, the planned procedure as above, anticipated post-operative course, overall prognosis, benefits to surgery, and risks, that  include, but are not limited to: infection, wound healing issues, stiffness, persisting or worsening pain, numbness, nerve and soft tissue injury, surgery risks: need for further surgery, malunion/nonunion, hardware failure/breakage, potential need for hardware removal, failure of procedure, complications of anesthesia, stroke, blood clot, major cardiopulmonary event, and loss of limb or life. The patient wishes to proceed with surgical intervention as described and informed consent was obtained.    - She will follow up: for surgery, sooner if needed  Questions and concerns have been invited/addressed and the patient has a good understanding of the diagnosis, treatment plan, goals/expectations, and overall prognosis.    Monique Murdoch, PA-C  12/17/2021, 17:11  Monique Repress, MD  Assistant Professor-   Foot & Ankle Surgery  Department of [place] [facility]              Family Medicine note 12/19/21  Surgery:  Patient has planned surgery of the left foot next week.  She has a known 4th middle phalanx fracture.  Status post trauma.  Podiatry has her weight-bearing as tolerated in a postop shoe.  She  has been told she will need to stop her Plavix and aspirin 5-7 days prior to surgery.    Left foot surgery:   Considered low risk surgery.    Patient has significant risk of diabetes.  No known arrhythmia.  No active chest pain.  No active infection.  Patient is acceptable risk for surgery.  Should proceed to the OR after informed consent with surgical provider.  Patient's anticoagulation has been stopped 5 days prior to the procedure.    Return in about 3 months (around 03/19/2022), or if symptoms worsen or fail to improve, for In Person Visit, diabetes mellitus.  Monique Beach, MD  12/19/2021, 10:13          Cardiology Clearance letter in media 12/23/21  "It has been quite some time since her stents so no contraindication from a cardiac standpoint to proceed with upcoming planned procedure and hold her Plavix. Monique [name] has occluded left ICA and is followed by vascular so cleared from cardiac standpoint but may want to have clearance with vascular as well."       Vascular Clearance letter in media 12/24/21        Instructed patient to hold:  - vitamins, supplements, and NSAIDs for 7 days prior to surgery.  - aspirin and Plavix 5-7 days prior per Ortho recommendations (as long as stable from Vascular and Cardiology standpoint)  - jardiance 3 days prior to surgery  - metformin the day before and day of surgery   - lasix the morning of surgery     Patient instructed to take the following medications day of surgery: Norco if needed, synthroid, Toprol-XL, omeprazole, Zofran if needed, Crestor, venlafaxine, zyrtec        Provided patient with a copy of anesthesia consent. Instructed patient to review consent prior to OR date and to bring any questions for the morning of surgery.

## 2021-12-25 ENCOUNTER — Encounter (INDEPENDENT_AMBULATORY_CARE_PROVIDER_SITE_OTHER): Payer: Self-pay | Admitting: Family Medicine

## 2021-12-27 ENCOUNTER — Ambulatory Visit (HOSPITAL_COMMUNITY): Payer: Commercial Managed Care - PPO

## 2021-12-27 ENCOUNTER — Inpatient Hospital Stay (HOSPITAL_COMMUNITY): Payer: Commercial Managed Care - PPO

## 2021-12-27 ENCOUNTER — Encounter (HOSPITAL_COMMUNITY)
Admission: RE | Disposition: A | Discharge status: Home / Self Care | Payer: Self-pay | Source: Ambulatory Visit | Attending: Orthopaedic Surgery

## 2021-12-27 ENCOUNTER — Inpatient Hospital Stay
Admission: RE | Admit: 2021-12-27 | Discharge: 2021-12-27 | Disposition: A | Discharge status: Home / Self Care | Payer: Commercial Managed Care - PPO | Source: Ambulatory Visit | Attending: Orthopaedic Surgery | Admitting: Orthopaedic Surgery

## 2021-12-27 ENCOUNTER — Other Ambulatory Visit: Payer: Self-pay

## 2021-12-27 ENCOUNTER — Ambulatory Visit (HOSPITAL_BASED_OUTPATIENT_CLINIC_OR_DEPARTMENT_OTHER): Payer: Commercial Managed Care - PPO

## 2021-12-27 ENCOUNTER — Encounter (HOSPITAL_COMMUNITY): Payer: Self-pay | Admitting: Orthopaedic Surgery

## 2021-12-27 ENCOUNTER — Ambulatory Visit (HOSPITAL_COMMUNITY): Payer: Commercial Managed Care - PPO | Admitting: ANESTHESIOLOGY

## 2021-12-27 ENCOUNTER — Other Ambulatory Visit (HOSPITAL_COMMUNITY): Payer: Commercial Managed Care - PPO

## 2021-12-27 ENCOUNTER — Ambulatory Visit (HOSPITAL_BASED_OUTPATIENT_CLINIC_OR_DEPARTMENT_OTHER): Payer: Commercial Managed Care - PPO | Admitting: ANESTHESIOLOGY

## 2021-12-27 DIAGNOSIS — S92909A Unspecified fracture of unspecified foot, initial encounter for closed fracture: Secondary | ICD-10-CM

## 2021-12-27 DIAGNOSIS — S92522A Displaced fracture of medial phalanx of left lesser toe(s), initial encounter for closed fracture: Secondary | ICD-10-CM

## 2021-12-27 DIAGNOSIS — M79672 Pain in left foot: Secondary | ICD-10-CM

## 2021-12-27 DIAGNOSIS — E118 Type 2 diabetes mellitus with unspecified complications: Secondary | ICD-10-CM

## 2021-12-27 LAB — POC BLOOD GLUCOSE (RESULTS)
GLUCOSE, POC: 193 mg/dl — ABNORMAL HIGH (ref 70–105)
GLUCOSE, POC: 149 mg/dl — ABNORMAL HIGH (ref 70–105)

## 2021-12-27 SURGERY — OPEN REDUCTION INTERNAL FIXATION FRACTURE MIDFOOT TOE METARSAL
Anesthesia: Monitor Anesthesia Care | Laterality: Left | Wound class: Clean Wound: Uninfected operative wounds in which no inflammation occurred

## 2021-12-27 MED ORDER — ONDANSETRON HCL 4 MG TABLET
4.0000 mg | ORAL_TABLET | Freq: Three times a day (TID) | ORAL | 0 refills | Status: DC | PRN
Start: 2021-12-27 — End: 2022-07-17
  Filled 2021-12-27: qty 15, 5d supply, fill #0

## 2021-12-27 MED ORDER — ONDANSETRON HCL (PF) 4 MG/2 ML INJECTION SOLUTION
INTRAMUSCULAR | Status: AC
Start: 2021-12-27 — End: 2021-12-27
  Filled 2021-12-27: qty 2

## 2021-12-27 MED ORDER — ONDANSETRON HCL (PF) 4 MG/2 ML INJECTION SOLUTION
Freq: Once | INTRAMUSCULAR | Status: DC | PRN
Start: 2021-12-27 — End: 2021-12-27
  Administered 2021-12-27: 4 mg via INTRAVENOUS

## 2021-12-27 MED ORDER — LIDOCAINE (PF) 100 MG/5 ML (2 %) INTRAVENOUS SYRINGE
INJECTION | Freq: Once | INTRAVENOUS | Status: DC | PRN
Start: 2021-12-27 — End: 2021-12-27
  Administered 2021-12-27: 50 mg via INTRAVENOUS

## 2021-12-27 MED ORDER — SODIUM CHLORIDE 0.9 % (FLUSH) INJECTION SYRINGE
2.0000 mL | INJECTION | INTRAMUSCULAR | Status: DC | PRN
Start: 2021-12-27 — End: 2021-12-27

## 2021-12-27 MED ORDER — SENNOSIDES 8.6 MG-DOCUSATE SODIUM 50 MG TABLET
1.0000 | ORAL_TABLET | Freq: Every evening | ORAL | 0 refills | Status: DC
Start: 2021-12-27 — End: 2023-04-09
  Filled 2021-12-27: qty 30, 30d supply, fill #0

## 2021-12-27 MED ORDER — FENTANYL (PF) 50 MCG/ML INJECTION SOLUTION
12.5000 ug | INTRAMUSCULAR | Status: DC | PRN
Start: 2021-12-27 — End: 2021-12-27

## 2021-12-27 MED ORDER — PHENYLEPHRINE 1 MG/10 ML (100 MCG/ML) IN 0.9 % SOD.CHLORIDE IV SYRINGE
INJECTION | INTRAVENOUS | Status: AC
Start: 2021-12-27 — End: 2021-12-27
  Filled 2021-12-27: qty 10

## 2021-12-27 MED ORDER — SODIUM CHLORIDE 0.9 % (FLUSH) INJECTION SYRINGE
2.0000 mL | INJECTION | Freq: Three times a day (TID) | INTRAMUSCULAR | Status: DC
Start: 2021-12-27 — End: 2021-12-27

## 2021-12-27 MED ORDER — MELOXICAM 15 MG TABLET
15.0000 mg | ORAL_TABLET | Freq: Every day | ORAL | 0 refills | Status: AC
Start: 2021-12-27 — End: 2021-12-30
  Filled 2021-12-27: qty 3, 3d supply, fill #0

## 2021-12-27 MED ORDER — SODIUM CHLORIDE 0.9 % (FLUSH) INJECTION SYRINGE
20.0000 mL | INJECTION | Freq: Once | INTRAMUSCULAR | Status: DC | PRN
Start: 2021-12-27 — End: 2021-12-27

## 2021-12-27 MED ORDER — PROCHLORPERAZINE EDISYLATE 10 MG/2 ML (5 MG/ML) INJECTION SOLUTION
5.0000 mg | Freq: Once | INTRAMUSCULAR | Status: DC | PRN
Start: 2021-12-27 — End: 2021-12-27

## 2021-12-27 MED ORDER — MIDAZOLAM 1 MG/ML INJECTION SOLUTION
INTRAMUSCULAR | Status: AC
Start: 2021-12-27 — End: 2021-12-27
  Filled 2021-12-27: qty 2

## 2021-12-27 MED ORDER — PHENYLEPHRINE 1 MG/10 ML (100 MCG/ML) IN 0.9 % SOD.CHLORIDE IV SYRINGE
INJECTION | Freq: Once | INTRAVENOUS | Status: DC | PRN
Start: 2021-12-27 — End: 2021-12-27
  Administered 2021-12-27: 100 ug via INTRAVENOUS

## 2021-12-27 MED ORDER — ACETAMINOPHEN 500 MG TABLET
500.0000 mg | ORAL_TABLET | ORAL | 0 refills | Status: DC | PRN
Start: 2021-12-27 — End: 2021-12-27
  Filled 2021-12-27: qty 30, 5d supply, fill #0

## 2021-12-27 MED ORDER — FENTANYL (PF) 50 MCG/ML INJECTION SOLUTION
INTRAMUSCULAR | Status: AC
Start: 2021-12-27 — End: 2021-12-27
  Filled 2021-12-27: qty 5

## 2021-12-27 MED ORDER — BUPIVACAINE (PF) 0.5 % (5 MG/ML) INJECTION SOLUTION
30.0000 mL | Freq: Once | INTRAMUSCULAR | Status: DC | PRN
Start: 2021-12-27 — End: 2021-12-27
  Administered 2021-12-27: 40 mL via INTRAMUSCULAR

## 2021-12-27 MED ORDER — PROPOFOL 10 MG/ML INTRAVENOUS EMULSION
INTRAVENOUS | Status: DC | PRN
Start: 2021-12-27 — End: 2021-12-27
  Administered 2021-12-27: 0 ug/kg/min via INTRAVENOUS
  Administered 2021-12-27: 75 ug/kg/min via INTRAVENOUS
  Administered 2021-12-27: 100 ug/kg/min via INTRAVENOUS
  Administered 2021-12-27: 60 ug/kg/min via INTRAVENOUS

## 2021-12-27 MED ORDER — SODIUM CHLORIDE 0.9% FLUSH BAG - 250 ML
INTRAVENOUS | Status: DC | PRN
Start: 2021-12-27 — End: 2021-12-27

## 2021-12-27 MED ORDER — PROPOFOL 10 MG/ML INTRAVENOUS EMULSION
INTRAVENOUS | Status: AC
Start: 2021-12-27 — End: 2021-12-27
  Filled 2021-12-27: qty 20

## 2021-12-27 MED ORDER — PROPOFOL 10 MG/ML INTRAVENOUS EMULSION
INTRAVENOUS | Status: AC
Start: 2021-12-27 — End: 2021-12-27
  Filled 2021-12-27: qty 50

## 2021-12-27 MED ORDER — DEXTROSE 5% IN WATER (D5W) FLUSH BAG - 250 ML
INTRAVENOUS | Status: DC | PRN
Start: 2021-12-27 — End: 2021-12-27

## 2021-12-27 MED ORDER — LIDOCAINE (PF) 100 MG/5 ML (2 %) INTRAVENOUS SYRINGE
INJECTION | INTRAVENOUS | Status: AC
Start: 2021-12-27 — End: 2021-12-27
  Filled 2021-12-27: qty 5

## 2021-12-27 MED ORDER — OXYCODONE 5 MG TABLET
5.0000 mg | ORAL_TABLET | Freq: Four times a day (QID) | ORAL | 0 refills | Status: DC | PRN
Start: 2021-12-27 — End: 2021-12-27
  Filled 2021-12-27: qty 10, 3d supply, fill #0

## 2021-12-27 MED ORDER — MIDAZOLAM (PF) 1 MG/ML INJECTION SOLUTION
Freq: Once | INTRAMUSCULAR | Status: DC | PRN
Start: 2021-12-27 — End: 2021-12-27
  Administered 2021-12-27: 2 mg via INTRAVENOUS

## 2021-12-27 MED ORDER — CHOLECALCIFEROL (VITAMIN D3) 125 MCG (5,000 UNIT) CAPSULE
5000.0000 [IU] | ORAL_CAPSULE | Freq: Every day | ORAL | 0 refills | Status: DC
Start: 2021-12-27 — End: 2021-12-27
  Filled 2021-12-27: qty 30, 30d supply, fill #0

## 2021-12-27 MED ORDER — LACTATED RINGERS INTRAVENOUS SOLUTION
INTRAVENOUS | Status: DC
Start: 2021-12-27 — End: 2021-12-27

## 2021-12-27 MED ORDER — PROPOFOL 10 MG/ML IV BOLUS
INJECTION | Freq: Once | INTRAVENOUS | Status: DC | PRN
Start: 2021-12-27 — End: 2021-12-27
  Administered 2021-12-27: 35 mg via INTRAVENOUS

## 2021-12-27 MED ORDER — MIDAZOLAM 1 MG/ML INJECTION SOLUTION
2.0000 mg | Freq: Once | INTRAMUSCULAR | Status: DC | PRN
Start: 2021-12-27 — End: 2021-12-27
  Filled 2021-12-27: qty 2

## 2021-12-27 MED ORDER — ASCORBIC ACID (VITAMIN C) 500 MG TABLET
500.0000 mg | ORAL_TABLET | Freq: Every day | ORAL | 0 refills | Status: AC
Start: 2021-12-27 — End: 2022-03-27
  Filled 2021-12-27: qty 30, 30d supply, fill #0

## 2021-12-27 MED ORDER — FENTANYL (PF) 50 MCG/ML INJECTION SOLUTION
25.0000 ug | INTRAMUSCULAR | Status: DC | PRN
Start: 2021-12-27 — End: 2021-12-27

## 2021-12-27 MED ORDER — DEXTROSE 5 % IN WATER (D5W) INTRAVENOUS SOLUTION
2.0000 g | Freq: Once | INTRAVENOUS | Status: AC
Start: 2021-12-27 — End: 2021-12-27
  Administered 2021-12-27: 2 g via INTRAVENOUS
  Filled 2021-12-27: qty 20

## 2021-12-27 MED ORDER — FENTANYL (PF) 50 MCG/ML INJECTION SOLUTION
100.0000 ug | Freq: Once | INTRAMUSCULAR | Status: DC | PRN
Start: 2021-12-27 — End: 2021-12-27
  Administered 2021-12-27: 50 ug via INTRAVENOUS
  Filled 2021-12-27: qty 2

## 2021-12-27 SURGICAL SUPPLY — 56 items
APPL 70% ISPRP 2% CHG 26ML 13._2X13.2IN CHLRPRP PREP DEHP-FR (MED SURG SUPPLIES) ×1
APPL 70% ISPRP 2% CHG 26ML CHLRPRP HI-LT ORNG PREP STRL LF  DISP CLR (MED SURG SUPPLIES) ×1 IMPLANT
BANDAGE 5.5YDX4IN NONST ELAS KNIT 2 SLFCLS COTTON COMPRESS (WOUND CARE SUPPLY) ×1 IMPLANT
BANDAGE 5.5YDX4IN NONST ELAS KNIT 2 SLFCLS COTTON COMPRESS (WOUND CARE/ENTEROSTOMAL SUPPLY) ×1
BANDAGE 5.5YDX6IN NONST ELAS KNIT 2 SLFCLS COTTON COMPRESS (WOUND CARE SUPPLY) ×1 IMPLANT
BANDAGE 5.5YDX6IN NONST ELAS KNIT 2 SLFCLS COTTON COMPRESS (WOUND CARE/ENTEROSTOMAL SUPPLY) ×1
BANDAGE COFLX NL 5YDX4IN EASYTEAR CHSV CNTRL STRNG SFT (WOUND CARE/ENTEROSTOMAL SUPPLY) ×1
BANDAGE COFLX NL 5YDX4IN STRL CHSV SFT FOAM COMPRESS TAN LF (WOUND CARE SUPPLY) ×1 IMPLANT
BANDAGE ESMARK 12FTX4IN STRL ELAS COMPRESS BLU LF (WOUND CARE SUPPLY) ×1 IMPLANT
BANDAGE ESMARK 12FTX4IN STRL E_LAS COMPRESS BLU LF (WOUND CARE/ENTEROSTOMAL SUPPLY) ×1
BLADE SAW 27MM RECIPROCATE THK.38MM TAPER THN MICRO PREC STRL LF  DISP (SURGICAL CUTTING SUPPLIES) IMPLANT
BLADE SAW 27X.38MM RECIP TPR S_HORT PREC STRL (CUTTING ELEMENTS)
BLADE SAW 31X9MM OSCILLATE SGT_L SS LONG MED PREC STRL (CUTTING ELEMENTS)
BLADE SAW 90X11X1.19MM SGTL 2 CUT (SURGICAL CUTTING SUPPLIES) IMPLANT
BLADE SAW 90X11X1.19MM SGTL 2_CUT (CUTTING ELEMENTS)
BLADE SAW 9MM OSCILLATE SGTL SS THK.51MM 31MM MED LONG PREC STRL LF  DISP (SURGICAL CUTTING SUPPLIES) IMPLANT
BLANKET MISTRAL-AIR ADULT UPR BODY 79X29.9IN FRC AIR HI VOL BLWR INTUITIVE CONTROL PNL LRG LED (MED SURG SUPPLIES) ×1 IMPLANT
BLANKET MISTRAL-AIR UPR BODY 7_8.7X29.9IN FRC AIR WARM (MED SURG SUPPLIES) ×1
BURR SURG 54MM 3.2MM RND 6 FLUTE FAST CUT STRL LF  DISP (SURGICAL CUTTING SUPPLIES) IMPLANT
BURR SURG 54MM 3.2MM RND CARBI_DE 6 FLTD RND FST CUT STRL (CUTTING ELEMENTS)
CONV USE ITEM 322843 - MAT OR SEAFOAM GRN 40X28IN SURGISAFE BRR BCK SLIP FREE (DRAPE/PACKS/SHEETS/OR TOWEL) ×1 IMPLANT
CONV USE ITEM 337890 - PACK SURG BSIN 2 STRL LF  DISP (CUSTOM TRAYS & PACK) ×1 IMPLANT
CONV USE ITEM 338663 - PACK SURG CUSTOM EXTREMITY NONST DISP LF (CUSTOM TRAYS & PACK) ×1 IMPLANT
COVER CASSETTE UNIV XRY 40X20IN (FILM) ×1 IMPLANT
COVER CASSETTE UNIV XRY 40X20I_N (FILM) ×1
DEVICE DRUG DEL 20MM STRL LF (MED SURG SUPPLIES) ×2 IMPLANT
DEVICE SECURE STATLK BRTHBL ANCH PAD STAB FOLEY FOAM SIL CATH PED STRL LF  DISP (UROLOGICAL SUPPLIES) IMPLANT
DEVICE SECURE STATLK FOLEY ANC_H PAD STAB FOAM SIL CATH PED (UROLOGICAL SUPPLIES)
DRAPE ADH 51X47IN STRDRP LF  STRL DISP SURG CLR (DRAPE/PACKS/SHEETS/OR TOWEL) ×1 IMPLANT
DRAPE ADH 51X47IN U STRDRP LF_STRL DISP SURG PLASTIC CLR (DRAPE/PACKS/SHEETS/OR TOWEL) ×1
DRAPE CARM FLRSCP EXPD CLPSBL C-ARMOR STRL EQP (DRAPE/PACKS/SHEETS/OR TOWEL) ×1 IMPLANT
DRAPE CARM FLRSCP EXPD CLPSBL_C-ARMOR STRL EQP (DRAPE/PACKS/SHEETS/OR TOWEL) ×1
DRAPE CARM POLY STRAP MBL XRY 72X42IN LF  EQP (DRAPE/PACKS/SHEETS/OR TOWEL) ×1 IMPLANT
DRAPE CARM POLY STRAP MBL XRY_72X42IN LF EQP (DRAPE/PACKS/SHEETS/OR TOWEL) ×1
DRAPE IMPRV SPLT ADH 76X54IN LF  STRL DISP SURG SMS 24X6IN (DRAPE/PACKS/SHEETS/OR TOWEL) ×2 IMPLANT
DRAPE IMPRV SPLT ADH 76X54IN L_F STRL DISP SURG SMS 24X6IN (DRAPE/PACKS/SHEETS/OR TOWEL) ×4
DRESS WOUND 4X4IN JUMPSTART ANTIMIC ORDER IN MULTIPLES OF 10 EACH (WOUND CARE SUPPLY) IMPLANT
DRESS WOUND 5X4IN MPTL AG SAFETAC NONADH ANTIMIC LF  STRL (WOUND CARE SUPPLY) IMPLANT
DRESSING JUMPSTART 4X4 (WOUND CARE/ENTEROSTOMAL SUPPLY)
DRESSING WOUND SILVER 4X5 (WOUND CARE/ENTEROSTOMAL SUPPLY)
GARMENT COMPRESS MED CALF CENTAURA NYL VASOGRAD LTWT BRTHBL SEQ FIL BLU 18- IN (MED SURG SUPPLIES) ×1 IMPLANT
GARMENT COMPRESS MED CALF CENT_AURA NYL VASOGRAD LTWT BRTHBL (MED SURG SUPPLIES) ×1
MAT OR SEAFOAM GRN 40X28IN SURGISAFE BRR BCK SLIP FREE (DRAPE/PACKS/SHEETS/OR TOWEL) ×1
MBO USE ITEM 133984 - BANDAGE 5.5YDX4IN NONST ELAS KNIT 2 SLFCLS COTTON COMPRESS (WOUND CARE/ENTEROSTOMAL SUPPLY) ×1
PACK BASIN DBL CUSTOM (CUSTOM TRAYS & PACK) ×1
PACK CUSTOM EXTREMITY (CUSTOM TRAYS & PACK) ×1
PACK SURG CSTM EXTREMITY NONST DISP LF (CUSTOM TRAYS & PACK) ×1
PAD ABDOMINAL 10X8IN LF  STRL DISP (WOUND CARE SUPPLY) ×4 IMPLANT
PAD ABDOMINAL 8X10 STRL (WOUND CARE/ENTEROSTOMAL SUPPLY) ×4
SPONGE GAUZE 4X4IN MDCHC COTTON 12 PLY TY 7 LF  STRL DISP (WOUND CARE SUPPLY) ×1 IMPLANT
SPONGE GAUZE 4X4IN MDCHC COTTO_N 12 PLY TY 7 LF STRL DISP (WOUND CARE/ENTEROSTOMAL SUPPLY) ×1
STKNT ORTHO 72X6IN COTTON ALBHL 1 PLY PCUT LF  TUB STRL NATURAL (ORTHOPEDICS (NOT IMPLANTS)) ×1 IMPLANT
STOCKINETTE ORTHO 6X6IN PRECUT_COTTON STRL 7666 (ORTHOPEDICS (NOT IMPLANTS)) ×1
TRAY CATH 16FR 1 LYR FOLEY DRAIN BAG TEMP SENSOR PERI WIPE SIL PVP 400ML 2.5L 10CC LF (UROLOGICAL SUPPLIES) IMPLANT
TRAY CATH 16FR 1 LYR FOLEY DRA_IN BAG TEMP SENSOR PERI WIPE (UROLOGICAL SUPPLIES)
WIRE FIX 1.57MM CWR 2 END TROCAR TIP STRL LF (IMPLANTS TRAUMA) ×2 IMPLANT

## 2021-12-27 NOTE — Discharge Instructions (Signed)
SURGICAL DISCHARGE INSTRUCTIONS     Dr. Tera Helper, MD  performed your OPEN REDUCTION INTERNAL FIXATION FRACTURE MIDFOOT TOE METARSAL today at the [facility]:  Monday through Friday from 6 a.m. - 7 p.m.: [phone]  Between 7 p.m. - 6 a.m., weekends and holidays:  Call [facility] at [phone] or [phone].    PLEASE SEE WRITTEN HANDOUTS AS DISCUSSED BY YOUR NURSE:      SIGNS AND SYMPTOMS OF A WOUND / INCISION INFECTION   Be sure to watch for the following:  Increase in redness or red streaks near or around the wound or incision.  Increase in pain that is intense or severe and cannot be relieved by the pain medication that your doctor has given you.  Increase in swelling that cannot be relieved by elevation of a body part, or by applying ice, if permitted.  Increase in drainage, or if yellow / green in color and smells bad. This could be on a dressing or a cast.  Increase in fever for longer than 24 hours, or an increase that is higher than 101 degrees Fahrenheit (normal body temperature is 98 degrees Fahrenheit). The incision may feel warm to the touch.    **CALL YOUR DOCTOR IF ONE OR MORE OF THESE SIGNS / SYMPTOMS SHOULD OCCUR.    ANESTHESIA INFORMATION   LOCAL ANESTHETIC:  You have receieved a local anesthetic, the effects should disappear in a few hours.    REMEMBER   If you experience any difficulty breathing, chest pain, bleeding that you feel is excessive, persistent nausea or vomiting or for any other concerns:  Call your physician Dr. Iris Pert at [phone] or [phone]. You may also ask to have the *** doctor on call paged. They are available to you 24 hours a day.    SPECIAL INSTRUCTIONS / COMMENTS   See handouts    FOLLOW-UP APPOINTMENTS   Please call patient services at [phone] or [phone] to schedule a date / time of return. They are open Monday - Friday from 7:30 am - 5:00 pm.

## 2021-12-27 NOTE — H&P (Signed)
[facility]                                                     H&P Update Form    Monique [name], Monique [name], 56 y.o. female  Date of Admission:  12/27/2021  Date of Birth:  02-Mar-1966    12/27/2021    Pre-Surgical H & P updated the day of the procedure.    H&P completed within 30 days of surgical procedure was performed by Jimmye Norman on 12/17/21 and has been reviewed.      --  Loreen Freud, MD 12/27/2021, 10:30

## 2021-12-27 NOTE — Progress Notes (Signed)
I discussed with Monique [name] again today the nature of the procedure, the risks and the benefits. We already spoke over the phone 2 days ago to discuss that. Her HB A1c is 10.4 and she continues to be a smoker. These 2 factors increases the risks of surgical complications. We discussed again the non op treatment, however, the patient continues to be in pain 4 weeks after the fracture and would like to get it fixed. We discussed that a percutaneous procedure is the safest in terms of surgical complications, however, the fracture is 7 weeks old and could be hard to mobilize the fragments. An open approach would allow fracture fixation, but increases the risks of wound complications potentially leading to toe amputation. The patient elected to proceed with percutaneous reduction and pinning of the toe. We discussed that a percutaneous approach might not lead to an anatomical reduction. If PIP arthritis occurs down the line, this could be treated with a PIP arthrodesis if needed.      Arlyss Repress, MD  Assistant Professor-   Foot & Ankle Surgery  Department of [place] [facility]

## 2021-12-27 NOTE — Nurses Notes (Signed)
md notified that patient was requesting a walker. Orders received for a walker. Patient stated that she would fill this prescription outpatient.

## 2021-12-27 NOTE — Anesthesia Procedure Notes (Addendum)
Block: Peripheral Block    Performed By:   Authorizing Provider:  Michail Jewels, DO  Performing Provider:  Earley Favor, MD  I was present and supervised/observed the entire procedure.  Michail Jewels, DO 12/29/2021, 10:23   Sedation  The patient was continuously monitored throughout the procedure and in recovery. I was in attendance and supervised the sedation (during the start and stop times listed below) and remained immediately available until the patient returned to pre-procedure baseline.  Michail Jewels, DO 12/29/2021, 10:23  Sedation Start Time  12/27/2021 10:50 AM   Sedation Stop Time 12/27/2021 10:58 AM  Blocks  Left Adductor femoral  Type of Block: single shot  Ultrasound used for needle placement, imaging, supervision, and interpretation    Diagnosis: foot pain   Indication: Requested by surgeon and Acute post operative pain   Pt location: at Bedside  Requesting Surgeon: Tera Helper, MD  Site verified, H&P updated and consent obtained, Patient monitors applied, Timeout performed, Emergency drugs and equipment available, Patient positioned and anesthesia consent given  Technique(See MAR for doses)       Preprocedure hand washing was performed sterile field maintained      Sterile Skin Prep : Supplies assembled on sterile field, Sterile gloves, Sterile technique, Hand hygiene performed, Sterile field established, Mask, Aseptic technique and cap    Skin prepped with: Chlorhexidine gluconate and isopropyl alcohol    Skin Local      Needle  Needle type: Stimuplex     Needle Gauge: 21G   Needle length: 4 in.  Needle localization: anatomical landmarks and ultrasound guidance  Number of attempts: 1      Catheter          Site      Medications    Assessment  Injection assessment: incremental injection, local visualized surrounding nerve on ultrasound and negative aspiration for heme  Paresthesia pain: none    Heart Rate changed: No    Events     Patient tolerance of procedure: tolerated well, no  immediate complications        NOTES: Continuous ultrasound was used to guide the needle tip in close proximity of the neural target.  There was minimal disruption of musculoskeletal and vascular structures with exceptions as noted.  Appropriate hydrodissection was achieved and no intraneural injection was appreciated/occurred.  CPT [place], MD 12/27/2021  [facility] Anesthesiology Service  Pager [phone]  [phone]

## 2021-12-27 NOTE — Progress Notes (Signed)
[facility]  Orthopaedic Progress Note    Ortho Attending: Dr. Iris Pert  Date: 12/27/2021    Subjective:  Resting comfortably in PACU, maintaining airway. Pain tolerable. Denies CP/SOB/N/V    Objective:   Vitals: BP 127/84    Pulse 77    Temp 35.9 C (96.6 F)    Resp 12    Ht 1.676 m (5\' 6" )    Wt 93.7 kg (206 lb 9.1 oz)    LMP  (LMP Unknown)    SpO2 98%    BMI 33.34 kg/m       NAD    LLE:  dressing / splint in place, c/d/i  Toes WWP, BCR  wiggles all toes within confines of splint  sensation intact in exposed toes    Assessment  56 y.o. female s/p CRPP L 4th toe    Plan  --Dressing: dressing / splint in place, c/d/i, keep on until follow-up appointment   --WB status: NWB LLE  --Antibiotics: peri-op ancef complete  --Diet: ok from ortho standpoint  --Pain control: script given  --PT/OT: ordered  --DVT proph: resume ASA and Plavix 1/21  --Disposition: d/c from PACU when meets criteria  --Follow-up: 01/14/22 Dr. Iris Pert clinic    --  Loreen Freud, MD  Resident  Department of Orthopaedics  PAGER = [phone]  12/27/2021 12:22    I saw and examined the patient.  I directly supervised the resident's activities and procedures.  I reviewed the resident's note.  I agree with the findings and plan of care as documented in the resident's note.      Arlyss Repress, MD  Assistant Professor-   Foot & Ankle Surgery  Department of [place] [facility]

## 2021-12-27 NOTE — Anesthesia Procedure Notes (Signed)
Block: Peripheral Block    Performed By:   Authorizing Provider:  Michail Jewels, DO  Performing Provider:  Earley Favor, MD  I was present and supervised/observed the entire procedure.  Michail Jewels, DO 12/29/2021, 10:23   Sedation  The patient was continuously monitored throughout the procedure and in recovery. I was in attendance and supervised the sedation (during the start and stop times listed below) and remained immediately available until the patient returned to pre-procedure baseline.  Michail Jewels, DO 12/29/2021, 10:23  Sedation Start Time  12/27/2021 10:50 AM   Sedation Stop Time 12/27/2021 10:58 AM  Blocks  Left sciatic Popliteal Fossa  Type of Block: single shot  Ultrasound used for needle placement, imaging, supervision, and interpretation  Image:  images available in PACS  Diagnosis: foot pain   Indication: Requested by surgeon and Acute post operative pain   Pt location: at Bedside  Requesting Surgeon: Tera Helper, MD  Site verified, H&P updated and consent obtained, Patient monitors applied, Timeout performed, Emergency drugs and equipment available, Patient positioned and anesthesia consent given  Technique(See MAR for doses)       Preprocedure hand washing was performed sterile field maintained      Sterile Skin Prep : Aseptic technique, Sterile gloves, Supplies assembled on sterile field, Mask, cap, Sterile technique, Hand hygiene performed and Sterile field established    Skin prepped with: Chlorhexidine gluconate and isopropyl alcohol    Skin Local      Needle  Needle type: Stimuplex     Needle Gauge: 21G   Needle length: 4 in.  Needle localization: anatomical landmarks and ultrasound guidance  Number of attempts: 1      Catheter          Site      Medications    Assessment  Injection assessment: negative aspiration for heme, local visualized surrounding nerve on ultrasound and incremental injection  Paresthesia pain: none    Heart Rate changed: No    Events     Patient  tolerance of procedure: tolerated well, no immediate complications        NOTES: Continuous ultrasound was used to guide the needle tip in close proximity of the neural target.  There was minimal disruption of musculoskeletal and vascular structures with exceptions as noted. An in-plane long-axis image was saved to the patient's medical record.  Appropriate hydrodissection was achieved and no intraneural injection was appreciated/occurred.    CPT Code 27618    Earley Favor, MD 12/27/2021   [facility] Acute Pain Service  [facility] Anesthesiology Department  [phone]

## 2021-12-27 NOTE — OR Surgeon (Signed)
[facility]        PATIENT NAME: Monique [name]   MRN: [id number]     SURGERY DATE:  12/27/2021     SURGEON: Tera Helper, MD    ASSISTANTS:   Circulator: Renold Genta, RN  Relief Circulator: Elicia Lamp, [place], RN  Relief Scrub: Mood, Linton Rump, ST  Scrub Person: Lubertha Basque, ST  Resident: Loreen Freud, MD      PREOPERATIVE DIAGNOSIS:left 4th toe middle phalanx comminuted fracture     POSTOPERATIVE DIAGNOSIS: same     PROCEDURE:  - Closed reduction and percutaneous pinning of middle phalanx comminuted fracture, 4th toe, left foot       ANESTHESIA:   Anesthesiologist: Mauro Kaufmann, MD; Ann Held, DO  CRNA: Michel Santee, CRNA   Monitor Anesthesia Care    COMPLICATIONS: none    ESTIMATED BLOOD LOSS:  none    INDICATIONS:   The patient is a 56 y.o. year old female with diagnosis as detailed above that requires surgical reduction/fixation as documented in the preoperative notes/history and physical.  She has elected to proceed with surgery for relief of symptoms.    We discussed the diagnosis, alternative treatment options, risks and benefits of the above surgical intervention, and all questions/concerns were addressed.  We further reviewed the associated risks and benefits of electing to pursue none of the non-operative or operative treatments.  The patient/family has verbalized a good understanding of the diagnosis, the planned procedure as above, anticipated post-operative course, overall prognosis, benefits to surgery, and risks, that include, but are not limited to: infection, wound healing issues, stiffness, persisting or worsening pain, numbness, nerve and soft tissue injury, need for further surgery, malunion/nonunion, hardware failure/breakage, potential need for hardware removal, failure of procedure, complications of anesthesia, stroke, blood clot, major cardiopulmonary event, and loss of limb or life.  The patient wishes to proceed with surgical intervention and both the patient  and I signed an informed surgical consent as such, in each others presence.    DESCRIPTION OF PROCEDURE:   I identified and met the patient in the pre-operative area.  I appropriately marked the correct operative extremity on the patient with a surgical marker, with the patient as my witness.  The informed consent was re-reviewed by myself with the patient and all appropriate parties, and resigned/dated as necessary by the appropriate parties.  I addressed all remaining questions and concerns with the patient/family.  A standardized, preoperative verification process was completed.  Prior to being brought back to the OR, the anesthesia team performed a popliteal-sciatic block.     The patient was then brought back to the OR and positioned supine on the OR table.  MAC anesthesia was induced  Standard, sterile prep with ChloraPrep and drape was then completed.  A surgical timeout was taken by myself and the participating anesthesia and OR staff, assuring the correct patient, procedure, operative extremity and site as previously marked, all necessary equipment/implants are available, and appropriate administration of IV antibiotics, including Ancef 2g within 1 hour of incision time.  The tourniquet was not used for the case.    Under fluoroscopic examination, the 4th toe middle phalanx comminuted fracture was evaluated on AP and lateral views.  Using a percutaneous reduction clamps and manual traction and manipulation, we attempted to reduce the comminuted fracture fragments.  We were able to realign the middle phalanx with the proximal phalanx out of subluxation.  However, the small comminuted pieces, specifically the dorsal small fragment was  not amenable to closed reduction using percutaneous clamps and pins.  Decision was made not to proceed with an open reduction due to the amount of comminution and the osteopenic nature of the bone.  A 1.6 mm K-wire was started from the tip of the toe.  The bone was very  osteopenic that the pin went through the bone without drilling.  The pin was advanced into the middle phalanx.  Then the middle phalanx and the proximal phalanx were aligned under lateral fluoroscopic views.  The plantar, larger fragment was reduced to a better position.  Then, the pin was advanced into the proximal phalanx.  The position and the pin and the alignment of the toe were confirmed on AP and lateral fluoroscopic views.  The pin was bent and then sterile dressing was applied.  This was followed by a well padded short leg splint.    The patient was woken up from anesthesia without difficulty and transported to PACU in stable condition.  All sponge and instrument counts were correct x 2.  There were no apparent complications.  Post-operative VTE prophylaxis plan will be: regular mobilization (encouraged to get up once or twice per hour while awake), elevation of the operative extremity with "toes above the nose" while at rest for the first 6 weeks, and  Aspirin  until mobilizing appropriately.  Patient resume her Plavix tomorrow.      IMPLANTS:   Implant Name Type Inv. Item Serial No. Manufacturer Lot No. LRB No. Used Action   WIRE C PK .062 IN [place] BX/50 - [id number]  WIRE C PK .062 IN [employer] [id number] BX/50 NA [employer] [id number] Left 2 Implanted        DISPOSITION: PACU - hemodynamically stable.    ATTESTATION:   I was personally present and scrubbed in for the entire procedure that was performed with the assistance of Resident: Loreen Freud, MD, MD.    POSTOP PLAN:  Weight-bearing status: heel WB x 4 wks  XR at 1st postop appt: 2V foot NWB    Arlyss Repress, MD  Assistant Professor-   Foot & Ankle Surgery  Department of [facility]

## 2021-12-27 NOTE — Brief Op Note (Signed)
[facility]                                                     BRIEF OPERATIVE NOTE    Patient Name: Monique [name], Monique [name] [facility] Number: [id number]  Date of Birth: Jul 11, 1966    All elements must be documented.    Pre-Operative Diagnosis: L 4th toe fx   Post-Operative Diagnosis: same  Procedure(s)/Description: CRPP L 4th toe fx  Findings: see dictation     Attending Surgeon: Iris Pert  Assistant(s): Laquentin Loudermilk    Anesthesia Type: block, sedation  Estimated Blood Loss:  Minimal  Blood Given: no  Fluids Given: see anaesthesia report  Tourniquet time: 3min  Complications (not routinely expected or not inherent to difficulty/nature of procedure): no  Characteristic Event (routinely expected or inherent to the difficulty/nature of the procedure): no  Did the use of current and/or prior Anticoagulants impact the outcome of the case? no  Wound Class: Clean Wound: Uninfected operative wounds in which no inflammation occurred    Tubes: None  Drains: None  Specimens/ Cultures: no  Implants: K-wire x1, see dictation           Disposition: PACU - hemodynamically stable.  Condition: stable    Loreen Freud, MD  Resident  Department of Orthopaedics  Pager: [phone]  12/27/2021 12:16

## 2021-12-27 NOTE — Consults (Signed)
[facility]  Acute Perioperative Pain Service Consult         Date of Service:  12/27/2021  Monique [name], Monique [name], 56 y.o. female  Encounter Start Date:  12/27/2021  Inpatient Admission Date:    Date of Birth:  1966/08/13    Plan:   Monique [name] is a 56 y.o. female who is scheduled for  Procedure(s) (LRB):  OPEN REDUCTION INTERNAL FIXATION FRACTURE MIDFOOT TOE METARSAL (Left) with anticipated post-operative pain.  - Offered patient Left Saphenous and Sciatic peripheral nerve blocks. Patient agreeable to procedure.   - Scheduled and PRN medications per primary service.   - For any questions please page or call the Acute Perioperative Pain Service at [phone].   - If not already done, please place consult order to IP Acute Perioperative Pain --RAP Thank you for your consultation.     Suggested Coding/Billing: Level 3    Type of Pain Consultation: Perioperative Pain  Consult Requested By: Dr. Iris Pert  Reason for Consult: Acute Postoperative Pain (ICD-10-G89.18)  Laterality left, Type of Surgery, ORIF Toe     Chief Complaint/Pain location: Left foot    History of Present Illness: Monique [name] is a 56 y.o., White female  with chief complaint as above. Anesthesiology Perioperative Pain service consulted for evaluation of acute post op foot pain. The pain is anticipated to be described as sharp post surgical pain. Pain will be located at the surgical site. Onset is anticipated to start post-operatively. Aggravating factors: movement.  Alleviating factors: pain medications.       Past Medical History:  Past Medical History:   Diagnosis Date    Anxiety     Arthritis     Back problem     DDD    Beta blocker prescribed for left ventricular systolic dysfunction     Blood thinned due to long-term anticoagulant use     Carotid stenosis     occlusion of left ICA    Chronic back pain     Coronary artery disease involving native coronary artery of native heart     DDD (degenerative disc disease), lumbar     "entire  spine"    Ear piercing     GERD (gastroesophageal reflux disease)     controlled with omeprazole    H/O complete eye exam     2 years, Dr. [name], at [facility]    History of dental examination     dentures upper and lower    HTN     Hx of coronary artery bypass graft     2012    Hx of gastric bypass     Hypercholesterolemia     Hyperlipidemia     Hypothyroid     MI (myocardial infarction) (CMS [facility]) 2012    Neck problem     herniated cervical disc    Obesity (BMI 30-39.9)     Solitary nodule of right lobe of thyroid 03/30/2018    Incidental finding on MRI thoracic spine. [place] ordered.     Stented coronary artery     x4    Stroke (CMS [facility])     2002    Tattoo     Left breast, Right shoulder    Thyroid disorder     Thyroid nodule     Type 2 diabetes mellitus (CMS HCC)     HGA1C 10.4 on 12/19/21    Uncontrolled type 2 diabetes mellitus, without long-term current use of insulin     Xanthelasma  OSA diagnosed? no  History of Anticoagulant Use? No    Social History:   Social History     Tobacco Use    Smoking status: Every Day     Packs/day: 0.50     Years: 20.00     Pack years: 10.00     Types: Cigarettes     Start date: 71    Smokeless tobacco: Never   Vaping Use    Vaping Use: Never used   Substance Use Topics    Alcohol use: No    Drug use: No       Past Family History:   Family Medical History:       Problem Relation (Age of Onset)    Atrial fibrillation Sister    Colon Cancer Father    Diabetes Mother, Maternal Aunt    Heart Disease Sister, Maternal Grandfather    Hypertension (High Blood Pressure) Mother    Lung Cancer Maternal Grandfather    MI <44 years of age Mother            ROS: Constitutional: negative for recent illnesses   Ears, nose, mouth, throat, and face: negative for nasal congestion, epistaxis, or tinnitus   Respiratory: negative for shortness of breath  Cardiovascular: negative for chest pain and dyspnea  Gastrointestinal: negative for nausea, vomiting  Integument/breast: negative for rash and skin  lesion  Hematologic/lymphatic: negative for easy bruising and bleeding  Musculoskeletal: negative for neck pain  Neurological: negative for numbness and tingling  Allergy:   Allergies   Allergen Reactions    Capron Dm [Pyrilamine-Dextromethorphan]  Other Adverse Reaction (Add comment)     LIGHT HEADED    Tylenol W Codeine [Acetaminophen-Codeine]  Other Adverse Reaction (Add comment)     pt states that one time she had chest pains with this med, but dr. thought it was because she had not eaten         Exam:   Temperature: 36 C (96.8 F)  Heart Rate: 83  BP (Non-Invasive): 106/71  Respiratory Rate: 12  SpO2: 94 %  General: appears in good health  HENT: Head atraumatic. normocephalic, ENT without erythema or injection, mucous membranes moist.  Neck: supple, symmetrical, trachea midline.  Lungs: Non-labored breathing.  Cardiovascular: regular rate and rhythm. Appears well perfused.   Extremities: extremities normal, atraumatic, no cyanosis or edema  Skin: No rashes or lesions  Neurologic: Alert and oriented x3. No gross motor or sensory deficits in bilateral extremities.  Psychiatric: Normal affect, behavior, speech      Labs:  I have reviewed all lab results.            I discussed treatment plan with patient including risks and benefits of procedures and patient consents.       Earley Favor, MD   12/27/2021 11:11  Department of Anesthesiology  [facility] Pager [phone]      I saw and examined the patient.  I reviewed the resident's note.  I agree with the findings and plan of care as documented in the resident's note.  Any exceptions/additions are edited/noted.    Michail Jewels, DO

## 2021-12-27 NOTE — Anesthesia Postprocedure Evaluation (Signed)
Anesthesia Post Op Evaluation    Patient: Monique [name]  Procedure(s) with comments:  OPEN REDUCTION INTERNAL FIXATION FRACTURE MIDFOOT TOE METARSAL - 4th middle phalanx    Last Vitals:Temperature: 36.2 C (97.2 F) (12/27/21 1230)  Heart Rate: 66 (12/27/21 1230)  BP (Non-Invasive): 137/81 (12/27/21 1230)  Respiratory Rate: 18 (12/27/21 1230)  SpO2: 98 % (12/27/21 1230)    No notable events documented.    Patient is sufficiently recovered from the effects of anesthesia to participate in the evaluation and has returned to their pre-procedure level.  Patient location during evaluation: PACU       Patient participation: complete - patient participated  Level of consciousness: awake and alert and responsive to verbal stimuli    Pain management: adequate  Airway patency: patent    Anesthetic complications: no  Cardiovascular status: acceptable  Respiratory status: acceptable  Hydration status: acceptable  Patient post-procedure temperature: Pt Normothermic   PONV Status: Absent

## 2021-12-27 NOTE — Anesthesia Transfer of Care (Signed)
ANESTHESIA TRANSFER OF CARE   Monique [name] is a 56 y.o. ,female, Weight: 93.7 kg (206 lb 9.1 oz)   had Procedure(s) with comments:  OPEN REDUCTION INTERNAL FIXATION FRACTURE MIDFOOT TOE METARSAL - 4th middle phalanx  performed  12/27/21   Primary Service: Tera Helper, MD    Past Medical History:   Diagnosis Date   . Anxiety    . Arthritis    . Back problem     DDD   . Beta blocker prescribed for left ventricular systolic dysfunction    . Blood thinned due to long-term anticoagulant use    . Carotid stenosis     occlusion of left ICA   . Chronic back pain    . Coronary artery disease involving native coronary artery of native heart    . DDD (degenerative disc disease), lumbar     "entire spine"   . Ear piercing    . GERD (gastroesophageal reflux disease)     controlled with omeprazole   . H/O complete eye exam     2 years, Dr. [name], at [facility]   . History of dental examination     dentures upper and lower   . HTN    . Hx of coronary artery bypass graft     2012   . Hx of gastric bypass    . Hypercholesterolemia    . Hyperlipidemia    . Hypothyroid    . MI (myocardial infarction) (CMS [facility]) 2012   . Neck problem     herniated cervical disc   . Obesity (BMI 30-39.9)    . 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 [place] ordered.    . Stented coronary artery     x4   . Stroke (CMS [facility])     2002   . Tattoo     Left breast, Right shoulder   . Thyroid disorder    . Thyroid nodule    . Type 2 diabetes mellitus (CMS HCC)     HGA1C 10.4 on 12/19/21   . Uncontrolled type 2 diabetes mellitus, without long-term current use of insulin    . Xanthelasma       Allergy History as of 12/27/21     NO KNOWN DRUG ALLERGIES       Noted Status Severity Type Reaction    09/07/18 0806 Devol, Summer, RN  Deleted       11/23/07   Active             ACETAMINOPHEN-CODEINE       Noted Status Severity Type Reaction    01/25/19 1357 Deborah Chalk, [place] 09/07/18 Active Low   Other Adverse Reaction  (Add comment)    Comments: pt states that one time she had chest pains with this med, but dr. thought it was because she had not eaten     09/07/18 0753 Devol, Summer, RN 09/07/18 Active       Comments: pt states that one time she had chest pains with this med, but dr. thought it was because she had not eaten           [facility]       Noted Status Severity Type Reaction    01/12/19 1245 Lucien Mons, [place] 12/31/18 Active Medium   Other Adverse Reaction (Add comment)    Comments: LIGHT HEADED     12/31/18 [place] Baxter Hire, RN 12/31/18 Active  I completed my transfer of care / handoff to the receiving personnel during which we discussed:  All key/critical aspects of case discussed, Analgesia and Gave opportunity for questions and acknowledgement of understanding    Post Location: Phase II                                          Additional Info:Pt transferred to Phase II on RA. VSS on monitor. Report given to RN                        Last OR Temp: Temperature: 35.9 C (96.6 F)  ABG:  PH   Date Value Ref Range Status   04/01/2011 7.430 7.350 - 7.450 Final     PCO2   Date Value Ref Range Status   04/01/2011 35.0 33.1 - 43.1 mm Hg Final   03/31/2011 41.0 41.0 - 51.0 mm Hg Final     PO2   Date Value Ref Range Status   04/01/2011 52 (L) 72 - 100 mm Hg Final   03/31/2011 252 (H) 35 - 50 mm Hg Final     SODIUM   Date Value Ref Range Status   03/31/2011 137 136 - 145 mmol/L Final     POTASSIUM   Date Value Ref Range Status   12/17/2021 4.0 3.5 - 5.1 mmol/L Final   04/16/2011 4.1 3.5 - 5.1 mmol/L Final     KETONES   Date Value Ref Range Status   04/15/2018 Not Detected Not Detected mg/dL Final   03/24/2011 NEGATIVE NEGATIVE mg/dL Final     WHOLE BLOOD K+   Date Value Ref Range Status   04/01/2011 4.4 3.5 - 5.0 mmol/L Final     Comment:     K+ results obtained via GEM 3500 analyzer.     CHLORIDE   Date Value Ref Range Status   03/31/2011 111 96 - 111 mmol/L Final     CALCIUM   Date Value  Ref Range Status   12/17/2021 9.1 8.5 - 10.0 mg/dL Final   04/16/2011 8.7 8.5 - 10.4 mg/dL Final     Comment:     Test performed by [facility], [address], [place], [place]  [address]     Calculated P Axis   Date Value Ref Range Status   12/24/2021 56 degrees Final     Calculated R Axis   Date Value Ref Range Status   12/24/2021 -19 degrees Final     Calculated T Axis   Date Value Ref Range Status   12/24/2021 -2 degrees Final     IONIZED CALCIUM   Date Value Ref Range Status   04/01/2011 1.15 (L) 1.30 - 1.46 mmol/L Final     GLUCOSE, POINT OF CARE   Date Value Ref Range Status   04/06/2011 128 (H) 70 - 105 mg/dL Final     Comment:     RN Notifiedbefore meal     LACTATE   Date Value Ref Range Status   03/31/2011 2.0 (H) 0.9 - 1.7 mmol/L Final     HEMOGLOBIN   Date Value Ref Range Status   04/01/2011 10.7 (L) 12.0 - 16.0 g/dL Final   03/31/2011 7.6 (L) 12.0 - 16.0 g/dL Final     OXYHEMOGLOBIN   Date Value Ref Range Status   04/01/2011 88.6 85.0 - 98.0 % Final  O2HB   Date Value Ref Range Status   03/31/2011 96.1 (HH) 40.0 - 70.0 % Final     CARBOXYHEMOGLOBIN   Date Value Ref Range Status   04/01/2011 1.8 0.0 - 2.5 % Final     CARBOHYHEMOGLOBIN   Date Value Ref Range Status   03/31/2011 1.1 0.0 - 1.5 % Final     MET-HEMOGLOBIN   Date Value Ref Range Status   04/01/2011 1.3 0.0 - 3.0 % Final   03/31/2011 1.3 0.0 - 3.0 % Final     BASE EXCESS   Date Value Ref Range Status   04/01/2011 Test Not Performed 0.0 - 3.0 mmol/L Final   03/31/2011 Test Not Performed 0.0 - 2.0 mmol/L Final     BASE DEFICIT   Date Value Ref Range Status   04/01/2011 0.8 0.0 - 3.0 mmol/L Final   03/31/2011 4.6 (H) 0.0 - 2.0 mmol/L Final     BICARBONATE   Date Value Ref Range Status   04/01/2011 24.1 18.0 - 29.0 mmol/L Final   03/31/2011 21.4 (LL) 22.0 - 26.0 mmol/L Final     TEMPERATURE, COMP   Date Value Ref Range Status   04/01/2011 37.0 15.0 - 40.0 C Final     %FIO2   Date Value Ref Range Status   04/01/2011 44 21 - 100 % Final    03/31/2011 100 % Final     Airway:* No LDAs found *  Blood pressure 127/84, pulse 77, temperature 35.9 C (96.6 F), resp. rate 12, height 1.676 m (_0 ), weight 93.7 kg (206 lb 9.1 oz), SpO2 98 %, not currently breastfeeding.

## 2021-12-30 ENCOUNTER — Encounter (HOSPITAL_BASED_OUTPATIENT_CLINIC_OR_DEPARTMENT_OTHER): Payer: Self-pay | Admitting: Internal Medicine

## 2021-12-30 ENCOUNTER — Other Ambulatory Visit (INDEPENDENT_AMBULATORY_CARE_PROVIDER_SITE_OTHER): Payer: Self-pay | Admitting: Family Medicine

## 2021-12-30 DIAGNOSIS — E559 Vitamin D deficiency, unspecified: Secondary | ICD-10-CM

## 2021-12-30 MED ORDER — PRENATAL PLUS (CALCIUM CARBONATE) 27 MG IRON-1 MG TABLET
1.0000 | ORAL_TABLET | Freq: Every day | ORAL | 3 refills | Status: DC
Start: 2021-12-30 — End: 2023-06-15

## 2021-12-30 NOTE — Telephone Encounter (Signed)
Last scheduled appointment with you was 12/19/2021.  Currently scheduled future appointment is 03/19/2022.      Rochele Pages, LPN  9/80/6999, 67:22

## 2021-12-31 NOTE — Addendum Note (Signed)
Addendum  created 12/31/21 0834 by Ann Held, DO    Attestation recorded in [place], [place] filed

## 2022-01-01 ENCOUNTER — Other Ambulatory Visit (INDEPENDENT_AMBULATORY_CARE_PROVIDER_SITE_OTHER): Payer: Self-pay | Admitting: Family Medicine

## 2022-01-01 DIAGNOSIS — K5900 Constipation, unspecified: Secondary | ICD-10-CM

## 2022-01-01 MED ORDER — DOCUSATE SODIUM 100 MG CAPSULE
100.0000 mg | ORAL_CAPSULE | Freq: Two times a day (BID) | ORAL | 4 refills | Status: DC
Start: 2022-01-01 — End: 2022-05-12

## 2022-01-01 NOTE — Telephone Encounter (Signed)
Last scheduled appointment with you was 12/19/2021.  Currently scheduled future appointment is 03/19/2022.      Rochele Pages, LPN  1/70/0174, 94:49

## 2022-01-02 ENCOUNTER — Other Ambulatory Visit (INDEPENDENT_AMBULATORY_CARE_PROVIDER_SITE_OTHER): Payer: Self-pay | Admitting: Family Medicine

## 2022-01-02 DIAGNOSIS — E1165 Type 2 diabetes mellitus with hyperglycemia: Secondary | ICD-10-CM

## 2022-01-02 MED ORDER — LANCETS
2 refills | Status: DC
Start: 2022-01-02 — End: 2022-08-28

## 2022-01-02 NOTE — Telephone Encounter (Signed)
Last scheduled appointment with you was 12/19/2021.  Currently scheduled future appointment is 03/19/2022.      Rochele Pages, LPN  8/33/7445, 14:60

## 2022-01-03 ENCOUNTER — Telehealth (INDEPENDENT_AMBULATORY_CARE_PROVIDER_SITE_OTHER): Payer: Self-pay | Admitting: Orthopaedic Surgery

## 2022-01-03 NOTE — Nursing Note (Signed)
Message from [facility] sent at 01/03/2022 9:58 AM EST    Summary: return call    Monique [name] pt     Pt calling in stating that get gauze came off of her foot after her surgery.   She wanted to see if she needed to place a new one or not   Please return her call   Thnak you                 Call History     Type Contact Phone/Fax User   01/03/2022 09:57 AM EST Phone (Incoming) Monique, [name] (Self) [phone] Jerilynn Mages) Gwyneth Sprout     return call  Received: Today  [place], Kylie [facility] Dr Iris Pert Service    She should have a splint on as well, so we should make sure she still has this on.  It is okay for her to put a clean dressing on, but she should not clean/scrub the incision/pin site.  I was not in this surgery, so I am unsure if they had xeroform on the pin site or what kind of dressing it was, but it is okay to replace with the same thing. Jarrett Soho 1/27 @ 10:34    Called patient back and relayed the above information from [place] to her. Patient verbalized understanding and thanked me for the call.    Ashley Mariner, RN  01/03/2022, 10:45

## 2022-01-14 ENCOUNTER — Ambulatory Visit (INDEPENDENT_AMBULATORY_CARE_PROVIDER_SITE_OTHER): Payer: Self-pay | Admitting: Orthopaedic Surgery

## 2022-01-16 ENCOUNTER — Inpatient Hospital Stay (HOSPITAL_BASED_OUTPATIENT_CLINIC_OR_DEPARTMENT_OTHER)
Admission: RE | Admit: 2022-01-16 | Discharge: 2022-01-16 | Disposition: A | Discharge status: Home / Self Care | Payer: Commercial Managed Care - PPO | Source: Ambulatory Visit

## 2022-01-16 ENCOUNTER — Ambulatory Visit: Payer: Commercial Managed Care - PPO | Attending: Orthopaedic Surgery | Admitting: Orthopaedic Surgery

## 2022-01-16 ENCOUNTER — Other Ambulatory Visit: Payer: Self-pay

## 2022-01-16 ENCOUNTER — Encounter (HOSPITAL_COMMUNITY): Payer: Self-pay | Admitting: *Deleted

## 2022-01-16 ENCOUNTER — Emergency Department (HOSPITAL_COMMUNITY): Payer: BLUE CROSS/BLUE SHIELD

## 2022-01-16 ENCOUNTER — Emergency Department (HOSPITAL_COMMUNITY)
Admission: EM | Admit: 2022-01-16 | Discharge: 2022-01-16 | Disposition: A | Discharge status: Home / Self Care | Payer: BLUE CROSS/BLUE SHIELD | Attending: Emergency Medicine | Admitting: Emergency Medicine

## 2022-01-16 DIAGNOSIS — M546 Pain in thoracic spine: Secondary | ICD-10-CM | POA: Diagnosis present

## 2022-01-16 DIAGNOSIS — M549 Dorsalgia, unspecified: Secondary | ICD-10-CM

## 2022-01-16 DIAGNOSIS — R079 Chest pain, unspecified: Secondary | ICD-10-CM | POA: Diagnosis not present

## 2022-01-16 DIAGNOSIS — S92919A Unspecified fracture of unspecified toe(s), initial encounter for closed fracture: Secondary | ICD-10-CM

## 2022-01-16 DIAGNOSIS — Z09 Encounter for follow-up examination after completed treatment for conditions other than malignant neoplasm: Secondary | ICD-10-CM | POA: Insufficient documentation

## 2022-01-16 DIAGNOSIS — S92522D Displaced fracture of medial phalanx of left lesser toe(s), subsequent encounter for fracture with routine healing: Secondary | ICD-10-CM

## 2022-01-16 LAB — CBC WITH DIFFERENTIAL/PLATELET
Abs Immature Granulocytes: 0.02 10*3/uL (ref 0.00–0.07)
Basophils Absolute: 0 10*3/uL (ref 0.0–0.1)
Basophils Relative: 1 %
Eosinophils Absolute: 0.2 10*3/uL (ref 0.0–0.5)
Eosinophils Relative: 3 %
HCT: 38.6 % (ref 36.0–46.0)
Hemoglobin: 13.2 g/dL (ref 12.0–15.0)
Immature Granulocytes: 0 %
Lymphocytes Relative: 27 %
Lymphs Abs: 1.7 10*3/uL (ref 0.7–4.0)
MCH: 30.4 pg (ref 26.0–34.0)
MCHC: 34.2 g/dL (ref 30.0–36.0)
MCV: 88.9 fL (ref 80.0–100.0)
Monocytes Absolute: 0.4 10*3/uL (ref 0.1–1.0)
Monocytes Relative: 7 %
Neutro Abs: 3.9 10*3/uL (ref 1.7–7.7)
Neutrophils Relative %: 62 %
Platelets: 170 10*3/uL (ref 150–400)
RBC: 4.34 MIL/uL (ref 3.87–5.11)
RDW: 12.6 % (ref 11.5–15.5)
WBC: 6.2 10*3/uL (ref 4.0–10.5)
nRBC: 0 % (ref 0.0–0.2)

## 2022-01-16 LAB — BASIC METABOLIC PANEL
Anion gap: 5 (ref 5–15)
BUN: 12 mg/dL (ref 6–20)
CO2: 25 mmol/L (ref 22–32)
Calcium: 8.7 mg/dL — ABNORMAL LOW (ref 8.9–10.3)
Chloride: 105 mmol/L (ref 98–111)
Creatinine, Ser: 0.64 mg/dL (ref 0.44–1.00)
GFR, Estimated: >60 mL/min (ref >60)
Glucose, Bld: 90 mg/dL (ref 70–99)
Potassium: 4.3 mmol/L (ref 3.5–5.1)
Sodium: 135 mmol/L (ref 135–145)

## 2022-01-16 LAB — TROPONIN I (HIGH SENSITIVITY): Troponin I (High Sensitivity): <2 ng/L (ref <18)

## 2022-01-16 LAB — D-DIMER, QUANTITATIVE: D-Dimer, Quant: 0.43 ug/mL-FEU (ref 0.00–0.50)

## 2022-01-16 MED ORDER — METHOCARBAMOL 750 MG PO TABS: 750.0000 mg | ORAL_TABLET | Freq: Three times a day (TID) | ORAL | 0 refills | Status: DC

## 2022-01-16 MED ORDER — KETOROLAC TROMETHAMINE 60 MG/2ML IM SOLN
30.0000 mg | Freq: Once | INTRAMUSCULAR | Status: AC
  Administered 2022-01-16: 30 mg via INTRAMUSCULAR
  Filled 2022-01-16: qty 2

## 2022-01-16 MED ORDER — OXYCODONE-ACETAMINOPHEN 5-325 MG PO TABS
1.0000 | ORAL_TABLET | Freq: Once | ORAL | Status: AC
  Administered 2022-01-16: 1 via ORAL
  Filled 2022-01-16: qty 1

## 2022-01-16 MED ORDER — PREDNISONE 10 MG PO TABS: ORAL_TABLET | ORAL | 0 refills | Status: DC

## 2022-01-16 MED ORDER — OXYCODONE-ACETAMINOPHEN 7.5-300 MG PO TABS: 1.0000 | ORAL_TABLET | Freq: Four times a day (QID) | ORAL | 0 refills | Status: DC | PRN

## 2022-01-16 MED ORDER — CYCLOBENZAPRINE HCL 10 MG PO TABS
10.0000 mg | ORAL_TABLET | Freq: Once | ORAL | Status: AC
  Administered 2022-01-16: 10 mg via ORAL
  Filled 2022-01-16: qty 1

## 2022-01-16 NOTE — ED Notes (Signed)
Patient transported to X-ray 

## 2022-01-16 NOTE — ED Provider Notes (Signed)
[facility] EMERGENCY DEPARTMENT Provider Note   CSN: [id number] Arrival date & time: 01/16/22  1155     History  Chief Complaint  Patient presents with   Shoulder Pain   chest pain    Ann [name] is a 56 y.o. female with past medical history including chronic pancreatitis (complication of gallbladder disease) GERD, h/o c spine trauma with paresthesias presenting with sudden onset of sharp stabbing pain in her right upper back occurring at 5:30 am today when she she was simply standing making her morning coffee.  She describes sharp knifelike pain between her right scapula and thoracic spine which is worsened with movement such as twisting her torso and with deep inspiration.  She denies sob.  She went to work but her pain escalated, now feels the pain into her left chest.  She has had no recent illness, uri/fever/flu like sx. Denies n/v, abd pain, no diaphoresis, palpitations, also denies peripheral edema, no h/o dvt/PE.  She took an oxycodone prior to arrival with minimal improvement in pain.  The history is provided by the patient.      Home Medications Prior to Admission medications   Medication Sig Start Date End Date Taking? Authorizing Provider  methocarbamol (ROBAXIN) 750 MG tablet Take 1 tablet (750 mg total) by mouth 3 (three) times daily. 01/16/22  Yes Stephine Langbehn, Raynelle Fanning, PA-C  oxycodone-acetaminophen (LYNOX) 7.5-300 MG tablet Take 1 tablet by mouth every 6 (six) hours as needed for pain. 01/16/22  Yes Maikol Grassia, Raynelle Fanning, PA-C  ALPRAZolam Prudy Feeler) 1 MG tablet Take 0.5-1 mg by mouth 2 (two) times daily as needed for anxiety or sleep. 01/07/18   [provider]  amphetamine-dextroamphetamine (ADDERALL) 20 MG tablet Take 20 mg by mouth every morning. As needed    [provider]  esomeprazole (NEXIUM) 40 MG capsule Take 1 capsule (40 mg total) by mouth daily before breakfast. 02/17/20 02/16/21  Gelene Mink, NP  HYDROcodone-acetaminophen (NORCO/VICODIN) 5-325 MG tablet Take 1  tablet by mouth every 6 (six) hours as needed for moderate pain. 03/09/20   Bethann Berkshire, MD  ibuprofen (ADVIL,MOTRIN) 800 MG tablet Take 800 mg by mouth every 8 (eight) hours as needed (for joint pain.).     [provider]  lipase/protease/amylase (CREON) 36000 UNITS CPEP capsule Take 2 capsules (72,000 Units total) by mouth 3 (three) times daily with meals. 1 with snacks 01/05/20   Gelene Mink, NP  Melatonin 5 MG CAPS Take by mouth at bedtime.    [provider]  ondansetron (ZOFRAN ODT) 4 MG disintegrating tablet 4mg  ODT q4 hours prn nausea/vomit 03/09/20   Bethann Berkshire, MD  pantoprazole (PROTONIX) 40 MG tablet Take 1 tablet (40 mg total) by mouth 2 (two) times daily before a meal. 08/23/20   Gelene Mink, NP  predniSONE (DELTASONE) 10 MG tablet 6, 5, 4, 3, 2 then 1 tablet by mouth daily for 6 days total. 01/16/22   Shelsie Tijerino, Raynelle Fanning, PA-C  traZODone (DESYREL) 50 MG tablet Take 50 mg by mouth at bedtime.    [provider]      Allergies    Imitrex [sumatriptan], Cymbalta [duloxetine hcl], Dicyclomine, Doxycycline, Gabapentin, Lyrica [pregabalin], Nucynta [tapentadol], Nuvigil [armodafinil], Other, Penicillins, Tape, Amoxicillin-pot clavulanate, Butrans [buprenorphine], and Ciprofloxacin    Review of Systems   Review of Systems  Constitutional:  Negative for chills and fever.  HENT:  Negative for congestion and sore throat.   Eyes: Negative.   Respiratory:  Negative for chest tightness and shortness of  breath.   Cardiovascular:  Positive for chest pain. Negative for palpitations and leg swelling.  Gastrointestinal:  Negative for abdominal pain, nausea and vomiting.  Genitourinary: Negative.   Musculoskeletal:  Positive for back pain. Negative for arthralgias, joint swelling, neck pain and neck stiffness.  Skin: Negative.  Negative for rash and wound.  Neurological:  Negative for dizziness, weakness, light-headedness, numbness and headaches.  Psychiatric/Behavioral:  Negative.     Physical Exam Updated Vital Signs BP 130/76 (BP Location: Right Arm)    Pulse 61    Temp 97.7 F (36.5 C) (Oral)    Resp 14    LMP 05/16/2012    SpO2 100%  Physical Exam Vitals and nursing note reviewed.  Constitutional:      Appearance: She is well-developed.  HENT:     Head: Normocephalic and atraumatic.  Eyes:     Conjunctiva/sclera: Conjunctivae normal.  Cardiovascular:     Rate and Rhythm: Normal rate and regular rhythm.     Heart sounds: Normal heart sounds.  Pulmonary:     Breath sounds: Normal breath sounds. No wheezing, rhonchi or rales.     Comments: Hesitant to take deep breath secondary to pain, ctab. Abdominal:     General: Bowel sounds are normal. There is no distension.     Palpations: Abdomen is soft.     Tenderness: There is no abdominal tenderness. There is no guarding.  Musculoskeletal:        General: No swelling. Normal range of motion.     Cervical back: Normal range of motion.     Thoracic back: Tenderness present.       Back:     Comments: Ttp right upper back near scapular edge. No t spine tenderness. No spasm appreciated. No erythema or rash.  Skin:    General: Skin is warm and dry.     Findings: No rash.  Neurological:     Mental Status: She is alert.    ED Results / Procedures / Treatments   Labs (all labs ordered are listed, but only abnormal results are displayed) Labs Reviewed  BASIC METABOLIC PANEL - Abnormal; Notable for the following components:      Result Value   Calcium 8.7 (*)    All other components within normal limits  D-DIMER, QUANTITATIVE  CBC WITH DIFFERENTIAL/PLATELET  TROPONIN I (HIGH SENSITIVITY)    EKG EKG Interpretation  Date/Time:  Thursday January 16 2022 12:15:09 EST Ventricular Rate:  76 PR Interval:  151 QRS Duration: 91 QT Interval:  429 QTC Calculation: 483 R Axis:   240 Text Interpretation: Sinus rhythm Left anterior fascicular block Low voltage, precordial leads Abnormal R-wave  progression, early transition Confirmed by Bethann Berkshire [phone]) on 01/16/2022 2:39:51 PM  Radiology DG Chest 2 View  Result Date: 01/16/2022 CLINICAL DATA:  Chest pain. EXAM: CHEST - 2 VIEW COMPARISON:  July 24, 2019. FINDINGS: The heart size and mediastinal contours are within normal limits. Both lungs are clear. The visualized skeletal structures are unremarkable. IMPRESSION: No active cardiopulmonary disease. Electronically Signed   By: Lupita Raider M.D.   On: 01/16/2022 13:28   DG Thoracic Spine 2 View  Result Date: 01/16/2022 CLINICAL DATA:  Back pain. EXAM: THORACIC SPINE 2 VIEWS COMPARISON:  None. FINDINGS: There is no evidence of thoracic spine fracture. Alignment is normal. No other significant bone abnormalities are identified. IMPRESSION: Negative. Electronically Signed   By: Lupita Raider M.D.   On: 01/16/2022 13:27    Procedures Procedures  Medications Ordered in ED Medications  oxyCODONE-acetaminophen (PERCOCET/ROXICET) 5-325 MG per tablet 1 tablet (1 tablet Oral Given 01/16/22 1303)  cyclobenzaprine (FLEXERIL) tablet 10 mg (10 mg Oral Given 01/16/22 1303)  ketorolac (TORADOL) injection 30 mg (30 mg Intramuscular Given 01/16/22 1514)    ED Course/ Medical Decision Making/ A&P                           Medical Decision Making Pt with sudden onset upper back pain, worsened with movement and deep inspiration but no sob, tachycardia, no signs suggesting dvt/PE.    Amount and/or Complexity of Data Reviewed Labs: ordered.    Details: Labs reviewed and reassuring. d dimer negative. she is not PERC negative due to age, but normal d dimer is reassuring.  Troponin negative, delta trop not indicated given sx occuring 9+ hours prior to sample obtained. Radiology: ordered and independent interpretation performed.    Details: t spine and cxr without identified source of pain, no thoracic compression fx. ECG/medicine tests: ordered.    Details: reviewed  Risk Prescription drug  management. Risk Details: Exam and hx suggesting MSK source of sx. This is not a PE and no suggestion of atypical ACS.  No rash, doubt early shingles (also would not expect this to be sudden onset).  Tx pain, heat tx,  prednisone for inflammation and robaxin for possible deep back wall muscle spasm.  Plan f/u with pcp prn.           Final Clinical Impression(s) / ED Diagnoses Final diagnoses:  Acute upper back pain    Rx / DC Orders ED Discharge Orders          Ordered    methocarbamol (ROBAXIN) 750 MG tablet  3 times daily        01/16/22 1635    oxycodone-acetaminophen (LYNOX) 7.5-300 MG tablet  Every 6 hours PRN        01/16/22 1635    predniSONE (DELTASONE) 10 MG tablet  Status:  Discontinued        01/16/22 1635    predniSONE (DELTASONE) 10 MG tablet        01/16/22 1637              Burgess Amor, PA-C 01/17/22 1707    Bethann Berkshire, MD 01/20/22 1150

## 2022-01-16 NOTE — ED Notes (Signed)
ED Provider at bedside. 

## 2022-01-16 NOTE — ED Notes (Signed)
Pt verbalized understanding of no driving and to use caution within 4 hours of taking pain meds due to meds cause drowsiness 

## 2022-01-16 NOTE — ED Triage Notes (Signed)
C/o pain in right shoulder radiating into chest area onset this am, worse with movement

## 2022-01-16 NOTE — ED Notes (Signed)
Pt. returned from XR. 

## 2022-01-16 NOTE — Discharge Instructions (Signed)
Take medications as prescribed, using the 7.5 mg strength oxycodone in place of your lower dose for the next several days.  Do not drive within 4 hours of taking this medication as it will make you drowsy.  Apply heating pad to your back for 20 minutes 3-4 times daily as discussed.  Plan a recheck with your doctor if your symptoms are not improving with this treatment plan.

## 2022-01-16 NOTE — Progress Notes (Signed)
[name], [name] [address]  O: [phone]  F: [phone]         [facility]        Name: Monique [name] MRN:  [id number]   Date: 01/16/2022 Age: 56 y.o.         Chief Complaint     Post Op (Left foot)       Date of Surgery: 12/27/2021    Surgery:  Close reduction and percutaneous pinning middle phalanx comminuted fracture, 4th, left foot      History of Present Illness      01/16/2022    Monique [name]  is a 56 y.o. who returns to clinic today 2 Weeks post the above mentioned procedure.  Overall, She is doing well.  She is Partial Weightbearing in a postoperative splint.  She has been using walker.. She has been taking aspirin and Plavix daily. Pain is well-controlled.  Patient had an instance where the dressing fell off.  The splint was kept in place. She denies any fever or chills.        Historical Data    Past Medical/Surgical History     Past Medical History:   Diagnosis Date   . Anxiety    . Arthritis    . Back problem     DDD   . Beta blocker prescribed for left ventricular systolic dysfunction    . Blood thinned due to long-term anticoagulant use    . Carotid stenosis     occlusion of left ICA   . Chronic back pain    . Coronary artery disease involving native coronary artery of native heart    . DDD (degenerative disc disease), lumbar     "entire spine"   . Ear piercing    . GERD (gastroesophageal reflux disease)     controlled with omeprazole   . H/O complete eye exam     2 years, Dr. [name], at [facility]   . History of dental examination     dentures upper and lower   . HTN    . Hx of coronary artery bypass graft     2012   . Hx of gastric bypass    . Hypercholesterolemia    . Hyperlipidemia    . Hypothyroid    . MI (myocardial infarction) (CMS [place]) 2012   . Neck problem     herniated cervical disc   . Obesity (BMI 30-39.9)    . Solitary nodule of  right lobe of thyroid 03/30/2018    Incidental finding on MRI thoracic spine. [place] ordered.    . Stented coronary artery     x4   . Stroke (CMS [facility])     2002   . Tattoo     Left breast, Right shoulder   . Thyroid disorder    . Thyroid nodule    . Type 2 diabetes mellitus (CMS [facility])     HGA1C 10.4 on 12/19/21   . Uncontrolled type 2 diabetes mellitus, without long-term current use of insulin    . Xanthelasma             Past Surgical History:   Procedure Laterality Date   . ENDOMETRIAL ABLATION  1998   . HX ANKLE FRACTURE TX  1990s    Right, Doran Durand procedure   . HX  CATARACT REMOVAL Bilateral 2013    r and l eyes with implants   . HX CESAREAN SECTION  06/20/2000    x3, 11/21/86, 11/10/84   . HX CHOLECYSTECTOMY  1988   . HX CORONARY ARTERY BYPASS GRAFT  03/31/2011    cardiac, 2 vessel, Dr. [name], [facility]   . HX CORONARY STENT PLACEMENT  2012    x4 stents   . HX GASTRIC SLEEVE  02/2017    [place], [place], Dr. Pearlie Oyster   . HX HEART CATHETERIZATION  2012    massive heart attack and stents - [facility]   . HX THYROID BIOPSY     . HX THYROIDECTOMY  101/01/2018   . HX TONSILLECTOMY      as child   . HX YAG Left 09/20/2013              Past Family and Social History     Family Medical History:     Problem Relation (Age of Onset)    Atrial fibrillation Sister    Colon Cancer Father    Diabetes Mother, Maternal Aunt    Heart Disease Sister, Maternal Grandfather    Hypertension (High Blood Pressure) Mother    Lung Cancer Maternal Grandfather    MI <7 years of age Mother            Social History     Socioeconomic History   . Marital status: Married     Spouse name: Monique [name]   . Number of children: 3   . Years of education: completed 11th grade   . Highest education level: Not on file   Occupational History   . Occupation: disabled   Tobacco Use   . Smoking status: Every Day     Packs/day: 0.50     Years: 20.00     Pack years: 10.00     Types: Cigarettes     Start date: 16   . Smokeless tobacco: Never   Vaping Use   . Vaping Use:  Never used   Substance and Sexual Activity   . Alcohol use: No   . Drug use: No   . Sexual activity: Yes     Partners: Male   Other Topics Concern   . Abuse/Domestic Violence Not Asked   . Breast Self Exam Not Asked   . Caffeine Concern Not Asked   . Calcium intake adequate Not Asked   . Computer Use Not Asked   . Drives Not Asked   . Exercise Concern Not Asked   . Helmet Use Not Asked   . Seat Belt Not Asked   . Special Diet Not Asked   . Sunscreen used Not Asked   . Uses Cane Not Asked   . Uses walker Not Asked   . Uses wheelchair Not Asked   . Right hand dominant Not Asked   . Left hand dominant Not Asked   . Ambidextrous Not Asked   . Shift Work Not Asked   . Unusual Sleep-Wake Schedule Not Asked   . Ability to Walk 1 Flight of Steps without SOB/CP Yes   . Routine Exercise No   . Ability to Walk 2 Flight of Steps without SOB/CP Yes   . Unable to Ambulate Not Asked   . Total Care Not Asked   . Ability To Do Own ADL's Yes   . Uses Walker No   . Other Activity Level Yes     Comment: on the go all the time - does  cleaning, grocery shopping   . Uses Cane No   Social History Narrative    Living situation: lives at home with husband, Monique [name], and daughter.  1 dog named Duke and 1 cat named Lucky.    Nutrition:  Eats 5 small meals d/t gastric sleeve surgery.     Caffeine use: none, decaf coffee    Exercise: stays busy around house. No formal form of exercise    Seatbelt use: sometimes    Fire extinguishers in the home: yes    Smoke alarms in the home: yes    Carbon monoxide detectors in the home: yes    Nancy Fetter exposure: sunglasses        [place] would like for husband, Monique [name],  to speak for her in the event she would become incapacitated.    Full code.      Social Determinants of Health     Financial Resource Strain: Not on file   Transportation Needs: Not on file   Social Connections: Not on file   Intimate Partner Violence: Not on file   Housing Stability: Not on file        Medications and Allergies     Home  Medications:    Current Outpatient Medications   Medication Instructions   . ACCU-CHEK AVIVA PLUS TEST STRP Strip USE 1 STRIP TO TEST BLOOD SUGAR THREE TIMES A DAY   . ascorbic acid (vitamin C) (VITAMIN C) 500 mg, Oral, DAILY   . aspirin (ECOTRIN) 81 mg, Oral, DAILY   . Blood-Glucose Meter (ACCU-CHEK AVIVA PLUS METER) Misc 1 Kit, Does not apply, 3 TIMES DAILY   . clopidogreL (PLAVIX) 75 mg Oral Tablet TAKE 1 TABLET BY MOUTH ONCE DAILY   . cyanocobalamin (vitamin B-12) 1,000 mcg, Oral, EVERY 7 DAYS   . cyanocobalamin (VITAMIN B12) 1,000 mcg, Subcutaneous, EVERY 30 DAYS   . diclofenac sodium (VOLTAREN) 2 g, Apply Topically, 3 TIMES DAILY PRN   . docusate sodium (COLACE) 100 mg, Oral, 2 TIMES DAILY   . empagliflozin (JARDIANCE) 10 mg, Oral, DAILY   . ergocalciferol, vitamin D2, (DRISDOL) 1,250 mcg (50,000 unit) Oral Capsule TAKE 1 CAPSULE BY MOUTH EVERY WEEK   . furosemide (LASIX) 40 mg Oral Tablet TAKE 1 TABLET(40 MG) BY MOUTH EVERY DAY   . guaiFENesin 200 mg, Oral, EVERY 6 HOURS PRN   . HYDROcodone-acetaminophen (NORCO) 10-325 mg Oral Tablet 1 Tablet, Oral, EVERY 8 HOURS PRN, [name] with [facility]   . Lancets (ACCU-CHEK SOFTCLIX LANCETS) Misc To use daily   . levothyroxine (SYNTHROID) 25 mcg, Oral, DAILY   . metformin (GLUCOPHAGE-XR) 750 mg, Oral, 2 TIMES DAILY WITH FOOD, Dosing to reduce GI symptoms.   . metoprolol succinate (TOPROL-XL) 12.5 mg, Oral, DAILY   . NARCAN 4 mg/actuation Nasal Spray, Non-Aerosol PT STATED SHE HAS NARCAN AT HOME BUT IS NOT TAKING IT   . Needle, Disp, 25 G 25 gauge x 1" Needle 1 mL, Does not apply, EVERY 30 DAYS   . niacin (NIASPAN) 500 mg, Oral, DAILY   . nitroGLYCERIN (NITROSTAT) 0.4 mg, Sublingual, EVERY 5 MIN PRN, for 3 doses over 15 minutes   . omeprazole (PRILOSEC) 40 mg Oral Capsule, Delayed Release(E.C.) TAKE 1 CAPSULE BY MOUTH EVERY DAY   . ondansetron (ZOFRAN ODT) 8 mg Oral Tablet, Rapid Dissolve DISSOLVE 1 TABLET(8 MG) ON THE TONGUE EVERY 8 HOURS AS  NEEDED FOR NAUSEA OR VOMITING   . ondansetron (ZOFRAN) 4 mg, Oral, EVERY 8 HOURS PRN   .  Ozempic 0.5 mg, Subcutaneous, EVERY 7 DAYS   . PRENATAL PLUS, CALCIUM CARB, 27 mg iron- 1 mg Oral Tablet 1 Tablet, Oral, DAILY   . rosuvastatin (CRESTOR) 40 mg, Oral, DAILY, TAKE 1 TABLET BY MOUTH ONCE DAILY   . sennosides-docusate sodium (SENOKOT-S) 8.6-50 mg Oral Tablet 1 Tablet, Oral, EVERY EVENING   . venlafaxine (EFFEXOR XR) 37.5 mg, Oral, DAILY   . ZYRTEC-D 5-120 mg Oral Tablet Sustained Release 12 hr 1 Tablet, Oral, DAILY       Allergies:  Allergies   Allergen Reactions   . Ane Payment [Pyrilamine-Dextromethorphan]  Other Adverse Reaction (Add comment)     LIGHT HEADED   . Tylenol W Codeine [Acetaminophen-Codeine]  Other Adverse Reaction (Add comment)     pt states that one time she had chest pains with this med, but dr. thought it was because she had not eaten             Review of Systems     A 14-point comprehensive ROS is reviewed with the patient today by my CMA/myself, and remains unchanged from prior, as documented on the original encounter date, except for pertinent musculoskeletal elements identified above in the HPI.    This ROS includes the following systems: Constitutional, HEENT, Eyes, Respiratory, Cardiovascular, GastrointestinaI, Endocrine, Genitourinary, Musculoskeletal, Allergy/Immuno, Neurological, Hematologic, Behavorial, and Skin.      Physical Examination + Relevant Imaging/Labs/Other Studies     General:  Well-developed, no acute distress  Neurological:  Oriented to person, place, and time.  Psychological:  Normal mood and affect.  Eyes: Pupils equal, round with synchronous movement; sclera white  Respiratory: Non-labored breathing, symmetric chest wall rise      Focused Foot/Ankle (surgical side)  Gait: Stable wih assistive device (cane/walker/etc.)     Neurovascular: Foot warm and well perfused.  2+ dorsalis pedis and posterior tibial pulses.  Brisk capillary refill. Sensation intact over the dorsal  and plantar aspects of the foot.        Inspection/Skin:   No erythema or drainage.  No indication of infection.  No wound dishiscence. Pin significantly pulled out of the 4th toe.    Tenderness: mild tenderness to light touch    Range of Motion: deferred    Strength: deferred    Stability: deferred        Imaging   Radiographs:  3V NWB of the left footare ordered, obtained, and independently reviewed/interpreted by myself today and reveal distal migration of the pin out of the proximal phalanx.      Assessment and Plan       ICD-10-CM    1. Postop check  Z09       2. Phalanx of the foot fracture  S92.919A Left Foot x-ray           [place] returns for Her postoperative visit, 2 weeks status post CRPP 4th toe.  Currently the incision lacks significant erythema, is not draining, and does not appear to be infected  She will be Partial weight bearing.  The patient should continue to take Plavix and aspirin for VTE prophylaxis.  The one suture was removed. Sterile dressing was placed with the toe kept exposed so the patient could evaluate the toe to make sure the toe remains viable. Toe strapping was applied and patient provided with a post op shoe. She was instructed to heel weight bear. We discussed the importance of better glucose control and to continue vitamin D.    - She will follow  up: 2 wks    Questions and concerns have been invited/addressed and the patient has a good understanding of the diagnosis, treatment plan, goals/expectations, and overall prognosis.      ATTESTATION:   I personally performed the service.     Arlyss Repress, MD  Assistant Professor-   Foot & Ankle Surgery  Department of [employer] [employer]

## 2022-01-22 DIAGNOSIS — M769 Unspecified enthesopathy, lower limb, excluding foot: Secondary | ICD-10-CM

## 2022-01-22 DIAGNOSIS — S92912A Unspecified fracture of left toe(s), initial encounter for closed fracture: Secondary | ICD-10-CM

## 2022-01-22 DIAGNOSIS — Z9889 Other specified postprocedural states: Secondary | ICD-10-CM

## 2022-01-29 ENCOUNTER — Ambulatory Visit (INDEPENDENT_AMBULATORY_CARE_PROVIDER_SITE_OTHER): Payer: Commercial Managed Care - PPO | Admitting: NURSE PRACTITIONER

## 2022-01-29 ENCOUNTER — Encounter (INDEPENDENT_AMBULATORY_CARE_PROVIDER_SITE_OTHER): Payer: Self-pay

## 2022-01-29 ENCOUNTER — Other Ambulatory Visit: Payer: Self-pay

## 2022-01-29 VITALS — BP 102/64 | HR 54 | Temp 97.1°F | Resp 18 | Ht 66.0 in | Wt 195.0 lb

## 2022-01-29 DIAGNOSIS — J40 Bronchitis, not specified as acute or chronic: Secondary | ICD-10-CM

## 2022-01-29 MED ORDER — PROMETHAZINE-DM 6.25 MG-15 MG/5 ML ORAL SYRUP
5.0000 mL | ORAL_SOLUTION | Freq: Four times a day (QID) | ORAL | 0 refills | Status: DC | PRN
Start: 2022-01-29 — End: 2022-02-07

## 2022-01-29 MED ORDER — DOXYCYCLINE MONOHYDRATE 100 MG CAPSULE
100.0000 mg | ORAL_CAPSULE | Freq: Two times a day (BID) | ORAL | 0 refills | Status: AC
Start: 2022-01-29 — End: 2022-02-08

## 2022-01-29 NOTE — Progress Notes (Signed)
[address] [facility] URGENT CARE  [address]  [place] [address]    Progress Note    Name: Monique [name] MRN:  [id number]   Date: 01/29/2022 Age: 56 y.o.            Chief complaint:   Sore Throat (Onset 3 days, daughter recently had bronchitis), Cough, Ear Pain, and Hoarse.    HPI:  The patient is a 56 y.o. old female who came in today for sore throat, hoarse voice, ear pain and cough. Patient reports symptoms have been present the last 3-4 days. Denies fever or any other symptoms.       ROS:  Review of Systems   Constitutional: Negative for chills, fatigue and fever.   HENT: Positive for ear pain and sore throat. Negative for congestion, rhinorrhea, sinus pressure and sneezing.    Respiratory: Positive for cough. Negative for chest tightness, shortness of breath and wheezing.    Cardiovascular: Negative for chest pain and palpitations.   Gastrointestinal: Negative for abdominal pain, constipation, diarrhea, nausea and vomiting.   Skin: Negative for rash.   Neurological: Negative for dizziness and headaches.          Past medical history:  Past Medical History:   Diagnosis Date   . Anxiety    . Arthritis    . Back problem     DDD   . Beta blocker prescribed for left ventricular systolic dysfunction    . Blood thinned due to long-term anticoagulant use    . Carotid stenosis     occlusion of left ICA   . Chronic back pain    . Coronary artery disease involving native coronary artery of native heart    . DDD (degenerative disc disease), lumbar     "entire spine"   . Ear piercing    . GERD (gastroesophageal reflux disease)     controlled with omeprazole   . H/O complete eye exam     2 years, Dr. [name], at [facility]   . History of dental examination     dentures upper and lower   . HTN    . Hx of coronary artery bypass graft     2012   . Hx of gastric bypass    . Hypercholesterolemia    . Hyperlipidemia    . Hypothyroid    . MI (myocardial infarction) (CMS [place]) 2012   . Neck problem     herniated cervical  disc   . Obesity (BMI 30-39.9)    . 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 [place] ordered.    . Stented coronary artery     x4   . Stroke (CMS [facility])     2002   . Tattoo     Left breast, Right shoulder   . Thyroid disorder    . Thyroid nodule    . Type 2 diabetes mellitus (CMS HCC)     HGA1C 10.4 on 12/19/21   . Uncontrolled type 2 diabetes mellitus, without long-term current use of insulin    . Xanthelasma          Past surgical history:  Past Surgical History:   Procedure Laterality Date   . ENDOMETRIAL ABLATION  1998   . HX ANKLE FRACTURE TX  1990s    Right, Doran Durand procedure   . HX CATARACT REMOVAL Bilateral 2013    r and l eyes with implants   .  HX CESAREAN SECTION  06/20/2000    x3, 11/21/86, 11/10/84   . HX CHOLECYSTECTOMY  1988   . HX CORONARY ARTERY BYPASS GRAFT  03/31/2011    cardiac, 2 vessel, Dr. [name], [facility]   . HX CORONARY STENT PLACEMENT  2012    x4 stents   . HX GASTRIC SLEEVE  02/2017    [place], [place], Dr. Pearlie Oyster   . HX HEART CATHETERIZATION  2012    massive heart attack and stents - [facility]   . HX THYROID BIOPSY     . HX THYROIDECTOMY  101/01/2018   . HX TONSILLECTOMY      as child   . HX YAG Left 09/20/2013          Medications:  Current Outpatient Medications   Medication Sig   . ACCU-CHEK AVIVA PLUS TEST STRP Strip USE 1 STRIP TO TEST BLOOD SUGAR THREE TIMES A DAY   . ascorbic acid, vitamin C, (VITAMIN C) 500 mg Oral Tablet Take 1 Tablet (500 mg total) by mouth Once a day for 90 days   . aspirin (ECOTRIN) 81 mg Oral Tablet, Delayed Release (E.C.) Take 1 Tablet (81 mg total) by mouth Once a day   . Blood-Glucose Meter (ACCU-CHEK AVIVA PLUS METER) Misc 1 Kit Three times a day   . clopidogreL (PLAVIX) 75 mg Oral Tablet TAKE 1 TABLET BY MOUTH ONCE DAILY   . cyanocobalamin (VITAMIN B12) 1,000 mcg/mL Injection Solution Inject 1 mL (1,000 mcg total) under the skin Every 30 days   . cyanocobalamin, vitamin B-12, 1,000 mcg/mL Oral Drops Take 1  mL (1,000 mcg total) by mouth Every 7 days (Patient not taking: Reported on 01/29/2022)   . diclofenac sodium (VOLTAREN) 1 % Gel Apply 2 g topically Three times a day as needed   . docusate sodium (COLACE) 100 mg Oral Capsule Take 1 Capsule (100 mg total) by mouth Twice daily   . doxycycline monohydrate (MONODOX) 100 mg Oral Capsule Take 1 Capsule (100 mg total) by mouth Twice daily for 10 days   . empagliflozin (JARDIANCE) 10 mg Oral Tablet Take 1 Tablet (10 mg total) by mouth Once a day   . ergocalciferol, vitamin D2, (DRISDOL) 1,250 mcg (50,000 unit) Oral Capsule TAKE 1 CAPSULE BY MOUTH EVERY WEEK   . furosemide (LASIX) 40 mg Oral Tablet TAKE 1 TABLET(40 MG) BY MOUTH EVERY DAY   . guaiFENesin 100 mg/5 mL Oral Liquid Take 10 mL (200 mg total) by mouth Every 6 hours as needed (Patient not taking: Reported on 01/29/2022)   . HYDROcodone-acetaminophen (NORCO) 10-325 mg Oral Tablet Take 1 Tablet by mouth Every 8 hours as needed for Pain [name] with [facility]   . Lancets (ACCU-CHEK SOFTCLIX LANCETS) Misc To use daily   . levothyroxine (SYNTHROID) 25 mcg Oral Tablet Take 1 Tablet (25 mcg total) by mouth Once a day   . metFORMIN (GLUCOPHAGE-XR) 750 mg Oral Tablet Sustained Release 24 hr Take 1 Tablet (750 mg total) by mouth Twice daily with food Dosing to reduce GI symptoms. (Patient not taking: Reported on 12/27/2021)   . metoprolol succinate (TOPROL-XL) 25 mg Oral Tablet Sustained Release 24 hr Take 0.5 Tablets (12.5 mg total) by mouth Once a day   . NARCAN 4 mg/actuation Nasal Spray, Non-Aerosol PT STATED SHE HAS NARCAN AT HOME BUT IS NOT TAKING IT   . Needle, Disp, 25 G 25 gauge x 1" Needle 1 mL Every 30 days   . niacin (NIASPAN) 500  mg Oral Tablet Sustained Release Take 1 Tablet (500 mg total) by mouth Once a day   . nitroGLYCERIN (NITROSTAT) 0.4 mg Sublingual Tablet, Sublingual Place 1 Tablet (0.4 mg total) under the tongue Every 5 minutes as needed for Chest pain for 3 doses over 15 minutes    . omeprazole (PRILOSEC) 40 mg Oral Capsule, Delayed Release(E.C.) TAKE 1 CAPSULE BY MOUTH EVERY DAY   . ondansetron (ZOFRAN ODT) 8 mg Oral Tablet, Rapid Dissolve DISSOLVE 1 TABLET(8 MG) ON THE TONGUE EVERY 8 HOURS AS NEEDED FOR NAUSEA OR VOMITING   . ondansetron (ZOFRAN) 4 mg Oral Tablet Take 1 Tablet (4 mg total) by mouth Every 8 hours as needed for Nausea/Vomiting   . PRENATAL PLUS, CALCIUM CARB, 27 mg iron- 1 mg Oral Tablet Take 1 Tablet by mouth Once a day   . promethazine-dextromethorphan (PHENERGAN-DM) 6.25-15 mg/5 mL Oral Syrup Take 5 mL by mouth Four times a day as needed for Cough   . rosuvastatin (CRESTOR) 40 mg Oral Tablet Take 1 Tablet (40 mg total) by mouth Once a day TAKE 1 TABLET BY MOUTH ONCE DAILY   . semaglutide (OZEMPIC) 0.25 mg or 0.5 mg(2 mg/1.5 mL) Subcutaneous Pen Injector Inject 0.5 mg under the skin Every 7 days   . sennosides-docusate sodium (SENOKOT-S) 8.6-50 mg Oral Tablet Take 1 Tablet by mouth Every evening (Patient not taking: Reported on 01/29/2022)   . venlafaxine (EFFEXOR XR) 37.5 mg Oral Capsule, Sust. Release 24 hr Take 1 Capsule (37.5 mg total) by mouth Once a day   . ZYRTEC-D 5-120 mg Oral Tablet Sustained Release 12 hr Take 1 Tablet by mouth Once a day     Allergies:  Allergies   Allergen Reactions   . Ane Payment [Pyrilamine-Dextromethorphan]  Other Adverse Reaction (Add comment)     LIGHT HEADED   . Other      Coban dressing caused skin break down   . Tylenol W Codeine [Acetaminophen-Codeine]  Other Adverse Reaction (Add comment)     pt states that one time she had chest pains with this med, but dr. thought it was because she had not eaten     Family history:  Family Medical History:     Problem Relation (Age of Onset)    Atrial fibrillation Sister    Colon Cancer Father    Diabetes Mother, Maternal Aunt    Heart Disease Sister, Maternal Grandfather    Hypertension (High Blood Pressure) Mother    Lung Cancer Maternal Grandfather    MI <27 years of age Mother          Social  history:  Social History     Tobacco Use   . Smoking status: Every Day     Packs/day: 0.50     Years: 20.00     Pack years: 10.00     Types: Cigarettes     Start date: 72   . Smokeless tobacco: Never   Vaping Use   . Vaping Use: Never used   Substance Use Topics   . Alcohol use: No   . Drug use: No        There are no exam notes on file for this visit.    Vitals:    01/29/22 1159   BP: 102/64   Pulse: 54   Resp: 18   Temp: 36.2 C (97.1 F)   SpO2: 100%   Weight: 88.5 kg (195 lb)   Height: 1.676 m (5' 6")   BMI: 31.54  Physical Examination:  Physical Exam  Vitals and nursing note reviewed.   Constitutional:       General: She is not in acute distress.     Appearance: Normal appearance.   HENT:      Head: Normocephalic and atraumatic.      Right Ear: Tympanic membrane and ear canal normal.      Left Ear: Tympanic membrane and ear canal normal.      Nose: Congestion present. No rhinorrhea.      Mouth/Throat:      Mouth: Mucous membranes are moist.      Pharynx: Oropharynx is clear. No oropharyngeal exudate or posterior oropharyngeal erythema.   Eyes:      Extraocular Movements: Extraocular movements intact.      Pupils: Pupils are equal, round, and reactive to light.   Cardiovascular:      Rate and Rhythm: Normal rate and regular rhythm.      Pulses: Normal pulses.      Heart sounds: Normal heart sounds, S1 normal and S2 normal. No murmur heard.  Pulmonary:      Effort: Pulmonary effort is normal. No respiratory distress.      Breath sounds: Normal breath sounds.      Comments: Coarse breath sounds bilaterally  Hacking cough present on exam  Musculoskeletal:      Cervical back: Normal range of motion and neck supple. No tenderness.   Lymphadenopathy:      Cervical: No cervical adenopathy.   Skin:     General: Skin is warm and dry.   Neurological:      General: No focal deficit present.      Mental Status: She is alert and oriented to person, place, and time.          Visit Diagnosis:    ICD-10-CM    1.  Bronchitis  J40            Plan:  Adequate hydration  Avoid respiratory irritants  Monitor for fever  Doxycycline as prescribed   Promethazine DM as prescribed as needed for cough. Advised promethazine may cause drowsiness, verbalized understanding.  Follow-up with PCP if symptoms persist  Return to clinic if needed  ER for any new or worsening symptoms. Patient verbalized understanding.       This visit was rendered independently without a supervising/collaborating physician on site.         Jeffie Pollock, APRN  01/29/2022, 13:04  Electronically signed by Jeffie Pollock, APRN    This note was partially created using MModal Fluency Direct system (voice recognition software) and is inherently subject to errors including those of syntax and "sound-alike" substitutions which may escape proofreading.  In such instances, original meaning may be extrapolated by contextual derivation.

## 2022-01-30 ENCOUNTER — Ambulatory Visit: Payer: Commercial Managed Care - PPO | Attending: Family Medicine | Admitting: Orthopaedic Surgery

## 2022-01-30 ENCOUNTER — Encounter (INDEPENDENT_AMBULATORY_CARE_PROVIDER_SITE_OTHER): Payer: Self-pay | Admitting: Orthopaedic Surgery

## 2022-01-30 DIAGNOSIS — E1165 Type 2 diabetes mellitus with hyperglycemia: Secondary | ICD-10-CM | POA: Insufficient documentation

## 2022-01-30 DIAGNOSIS — Z4789 Encounter for other orthopedic aftercare: Secondary | ICD-10-CM

## 2022-01-30 DIAGNOSIS — S92919A Unspecified fracture of unspecified toe(s), initial encounter for closed fracture: Secondary | ICD-10-CM | POA: Insufficient documentation

## 2022-01-30 DIAGNOSIS — Z09 Encounter for follow-up examination after completed treatment for conditions other than malignant neoplasm: Secondary | ICD-10-CM | POA: Insufficient documentation

## 2022-02-02 ENCOUNTER — Encounter (INDEPENDENT_AMBULATORY_CARE_PROVIDER_SITE_OTHER): Payer: Self-pay | Admitting: Orthopaedic Surgery

## 2022-02-02 NOTE — Progress Notes (Addendum)
[place],  [id number]  O: [phone]  F: [phone]         [facility]        Name: Monique [name] MRN:  [id number]   Date: 01/30/2022 Age: 56 y.o.         Chief Complaint     Follow Up       Date of Surgery: 12/27/2021    Surgery:  Close reduction and percutaneous pinning middle phalanx comminuted fracture, 4th, left foot      History of Present Illness       Monique [name]  is a 56 y.o. who returns to clinic today 2 Weeks post the above mentioned procedure.  Overall, She is doing well.  She is Partial Weightbearing in a postoperative splint.  She has been using walker.. She has been taking aspirin and Plavix daily. Pain is well-controlled.  Patient had an instance where the dressing fell off.  The splint was kept in place. She denies any fever or chills.      01/30/22  Monique [name] is a very pleasant 55 year old female patient who returns to clinic today 4 weeks status post the above-mentioned procedure.  The patient is doing really well.  She has been compliant with heel weight-bearing as much as possible but not with the toe strapping.  She states that she was unable to tolerate toe strapping due to skin irritation from Coban.  The patient has been back on aspirin and Plavix.  Her pain is well controlled.  She reports that the swelling has subsided on her 4th toe.  She is taking Vitamin D 50,000 units weekly.  She denies fever or chills.     She does smoke 1/2 pack of cigarettes today, but she has been trying to decrease this.    She reports improved glucose control.  Her blood glucose levels have ranged from 140-193 over the past week.  She was recently switched from Metformin to Actos.       Historical Data    Past Medical/Surgical History     Past Medical History:   Diagnosis Date   . Anxiety    . Arthritis    . Back problem     DDD   .  Beta blocker prescribed for left ventricular systolic dysfunction    . Blood thinned due to long-term anticoagulant use    . Carotid stenosis     occlusion of left ICA   . Chronic back pain    . Coronary artery disease involving native coronary artery of native heart    . DDD (degenerative disc disease), lumbar     "entire spine"   . Ear piercing    . GERD (gastroesophageal reflux disease)     controlled with omeprazole   . H/O complete eye exam     2 years, Dr. [name], at [facility]   . History of dental examination     dentures upper and lower   . HTN    . Hx of coronary artery bypass graft     2012   . Hx of gastric bypass    . Hypercholesterolemia    . Hyperlipidemia    . Hypothyroid    . MI (myocardial infarction) (CMS [place]) 2012   . Neck problem     herniated cervical disc   .  Obesity (BMI 30-39.9)    . 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 [place] ordered.    . Stented coronary artery     x4   . Stroke (CMS [facility])     2002   . Tattoo     Left breast, Right shoulder   . Thyroid disorder    . Thyroid nodule    . Type 2 diabetes mellitus (CMS HCC)     HGA1C 10.4 on 12/19/21   . Uncontrolled type 2 diabetes mellitus, without long-term current use of insulin    . Xanthelasma             Past Surgical History:   Procedure Laterality Date   . ENDOMETRIAL ABLATION  1998   . HX ANKLE FRACTURE TX  1990s    Right, Doran Durand procedure   . HX CATARACT REMOVAL Bilateral 2013    r and l eyes with implants   . HX CESAREAN SECTION  06/20/2000    x3, 11/21/86, 11/10/84   . HX CHOLECYSTECTOMY  1988   . HX CORONARY ARTERY BYPASS GRAFT  03/31/2011    cardiac, 2 vessel, Dr. [name], [facility]   . HX CORONARY STENT PLACEMENT  2012    x4 stents   . HX GASTRIC SLEEVE  02/2017    [place], [place], Dr. Pearlie Oyster   . HX HEART CATHETERIZATION  2012    [facility] st[facility] - [facility]   . HX THYROID BIOPSY     . HX THYROIDECTOMY  101/01/2018   . HX TONSILLECTOMY      as child   . HX YAG  Left 09/20/2013              Past Family and Social History     Family Medical History:     Problem Relation (Age of Onset)    Atrial fibrillation Sister    Colon Cancer Father    Diabetes Mother, Maternal Aunt    Heart Disease Sister, Maternal Grandfather    Hypertension (High Blood Pressure) Mother    Lung Cancer Maternal Grandfather    MI <62 years of age Mother            Social History     Socioeconomic History   . Marital status: Married     Spouse name: [name]   . Number of children: 3   . Years of education: completed 11th grade   . Highest education level: Not on file   Occupational History   . Occupation: disabled   Tobacco Use   . Smoking status: Every Day     Packs/day: 0.50     Years: 20.00     Pack years: 10.00     Types: Cigarettes     Start date: 23   . Smokeless tobacco: Never   Vaping Use   . Vaping Use: Never used   Substance and Sexual Activity   . Alcohol use: No   . Drug use: No   . Sexual activity: Yes     Partners: Male   Other Topics Concern   . Abuse/Domestic Violence Not Asked   . Breast Self Exam Not Asked   . Caffeine Concern Not Asked   . Calcium intake adequate Not Asked   . Computer Use Not Asked   . Drives Not Asked   . Exercise Concern Not Asked   . Helmet Use Not Asked   . Seat Belt  Not Asked   . Special Diet Not Asked   . Sunscreen used Not Asked   . Uses Cane Not Asked   . Uses walker Not Asked   . Uses wheelchair Not Asked   . Right hand dominant Not Asked   . Left hand dominant Not Asked   . Ambidextrous Not Asked   . Shift Work Not Asked   . Unusual Sleep-Wake Schedule Not Asked   . Ability to Walk 1 Flight of Steps without SOB/CP Yes   . Routine Exercise No   . Ability to Walk 2 Flight of Steps without SOB/CP Yes   . Unable to Ambulate Not Asked   . Total Care Not Asked   . Ability To Do Own ADL's Yes   . Uses Walker No   . Other Activity Level Yes     Comment: on the go all the time - does cleaning, grocery shopping   . Uses Cane No   Social History Narrative    Living  situation: lives at home with husband, [name], and daughter.  1 dog named Duke and 1 cat named Lucky.    Nutrition:  Eats 5 small meals d/t gastric sleeve surgery.     Caffeine use: none, decaf coffee    Exercise: stays busy around house. No formal form of exercise    Seatbelt use: sometimes    Fire extinguishers in the home: yes    Smoke alarms in the home: yes    Carbon monoxide detectors in the home: yes    Monique [name]: sunglasses        Adrianna would like for husband, Cimone Fahey,  to speak for her in the event she would become incapacitated.    Full code.      Social Determinants of Health     Financial Resource Strain: Not on file   Transportation Needs: Not on file   Social Connections: Not on file   Intimate Partner Violence: Not on file   Housing Stability: Not on file        Medications and Allergies     Home Medications:    Current Outpatient Medications   Medication Instructions   . ACCU-CHEK AVIVA PLUS TEST STRP Strip USE 1 STRIP TO TEST BLOOD SUGAR THREE TIMES A DAY   . ascorbic acid (vitamin C) (VITAMIN C) 500 mg, Oral, DAILY   . aspirin (ECOTRIN) 81 mg, Oral, DAILY   . Blood-Glucose Meter (ACCU-CHEK AVIVA PLUS METER) Misc 1 Kit, Does not apply, 3 TIMES DAILY   . clopidogreL (PLAVIX) 75 mg Oral Tablet TAKE 1 TABLET BY MOUTH ONCE DAILY   . cyanocobalamin (vitamin B-12) 1,000 mcg, Oral, EVERY 7 DAYS   . cyanocobalamin (VITAMIN B12) 1,000 mcg, Subcutaneous, EVERY 30 DAYS   . diclofenac sodium (VOLTAREN) 2 g, Apply Topically, 3 TIMES DAILY PRN   . docusate sodium (COLACE) 100 mg, Oral, 2 TIMES DAILY   . doxycycline monohydrate (MONODOX) 100 mg, Oral, 2 TIMES DAILY   . empagliflozin (JARDIANCE) 10 mg, Oral, DAILY   . ergocalciferol, vitamin D2, (DRISDOL) 1,250 mcg (50,000 unit) Oral Capsule TAKE 1 CAPSULE BY MOUTH EVERY WEEK   . furosemide (LASIX) 40 mg Oral Tablet TAKE 1 TABLET(40 MG) BY MOUTH EVERY DAY   . guaiFENesin 200 mg, Oral, EVERY 6 HOURS PRN   . HYDROcodone-acetaminophen (NORCO) 10-325 mg Oral  Tablet 1 Tablet, Oral, EVERY 8 HOURS PRN, [name] with [facility]   . Lancets (ACCU-CHEK SOFTCLIX LANCETS)  Misc To use daily   . levothyroxine (SYNTHROID) 25 mcg, Oral, DAILY   . metformin (GLUCOPHAGE-XR) 750 mg, Oral, 2 TIMES DAILY WITH FOOD, Dosing to reduce GI symptoms.   . metoprolol succinate (TOPROL-XL) 12.5 mg, Oral, DAILY   . NARCAN 4 mg/actuation Nasal Spray, Non-Aerosol PT STATED SHE HAS NARCAN AT HOME BUT IS NOT TAKING IT   . Needle, Disp, 25 G 25 gauge x 1" Needle 1 mL, Does not apply, EVERY 30 DAYS   . niacin (NIASPAN) 500 mg, Oral, DAILY   . nitroGLYCERIN (NITROSTAT) 0.4 mg, Sublingual, EVERY 5 MIN PRN, for 3 doses over 15 minutes   . omeprazole (PRILOSEC) 40 mg Oral Capsule, Delayed Release(E.C.) TAKE 1 CAPSULE BY MOUTH EVERY DAY   . ondansetron (ZOFRAN ODT) 8 mg Oral Tablet, Rapid Dissolve DISSOLVE 1 TABLET(8 MG) ON THE TONGUE EVERY 8 HOURS AS NEEDED FOR NAUSEA OR VOMITING   . ondansetron (ZOFRAN) 4 mg, Oral, EVERY 8 HOURS PRN   . Ozempic 0.5 mg, Subcutaneous, EVERY 7 DAYS   . PRENATAL PLUS, CALCIUM CARB, 27 mg iron- 1 mg Oral Tablet 1 Tablet, Oral, DAILY   . promethazine-dextromethorphan (PHENERGAN-DM) 6.25-15 mg/5 mL Oral Syrup 5 mL, Oral, 4 TIMES DAILY PRN   . rosuvastatin (CRESTOR) 40 mg, Oral, DAILY, TAKE 1 TABLET BY MOUTH ONCE DAILY   . sennosides-docusate sodium (SENOKOT-S) 8.6-50 mg Oral Tablet 1 Tablet, Oral, EVERY EVENING   . venlafaxine (EFFEXOR XR) 37.5 mg, Oral, DAILY   . ZYRTEC-D 5-120 mg Oral Tablet Sustained Release 12 hr 1 Tablet, Oral, DAILY       Allergies:  Allergies   Allergen Reactions   . Ane Payment [Pyrilamine-Dextromethorphan]  Other Adverse Reaction (Add comment)     LIGHT HEADED   . Other      Coban dressing caused skin break down   . Tylenol W Codeine [Acetaminophen-Codeine]  Other Adverse Reaction (Add comment)     pt states that one time she had chest pains with this med, but dr. thought it was because she had not eaten             Review of  Systems     A 14-point comprehensive ROS is reviewed with the patient today by my CMA/myself, and remains unchanged from prior, as documented on the original encounter date, except for pertinent musculoskeletal elements identified above in the HPI.    This ROS includes the following systems: Constitutional, HEENT, Eyes, Respiratory, Cardiovascular, GastrointestinaI, Endocrine, Genitourinary, Musculoskeletal, Allergy/Immuno, Neurological, Hematologic, Behavorial, and Skin.      Physical Examination + Relevant Imaging/Labs/Other Studies     General:  Well-developed, no acute distress  Neurological:  Oriented to person, place, and time.  Psychological:  Normal mood and affect.  Eyes: Pupils equal, round with synchronous movement; sclera white  Respiratory: Non-labored breathing, symmetric chest wall rise      Focused Foot/Ankle (surgical side)  Gait: Stable without assistive device     Neurovascular: Foot warm and well perfused.  2+ dorsalis pedis and posterior tibial pulses.  Brisk capillary refill. Sensation intact over the dorsal and plantar aspects of the foot.        Inspection/Skin:   No erythema or drainage.  No indication of infection.  No wound dishiscence.     Tenderness: mild tenderness to light touch    Range of Motion: deferred    Strength: deferred    Stability: deferred        Imaging   Radiographs:  No new  x-rays were obtained at this visit.    3V NWB of the left footare ordered, obtained, and independently reviewed/interpreted by Dr. Iris Pert previously and reveal distal migration of the pin out of the proximal phalanx.      Assessment and Plan       ICD-10-CM    1. Postop check  Z09       2. Phalanx of the foot fracture  S92.919A       3. Type 2 diabetes mellitus with hyperglycemia, unspecified whether long term insulin use (CMS [facility])  E11.65            Sacora Hawbaker returns for Her postoperative visit, 4 weeks status post CRPP 4th toe.  Currently the incision lacks significant erythema, is not  draining, and does not appear to be infected.  She will be Partial weight bearing in post-op shoe.  The patient should continue to take Plavix and aspirin for VTE prophylaxis.  She should continue to take Vitamin D 50,000 units weekly.      She was encouraged to continue to improve glucose control and smoking cessation.    - She will follow up: 4 weeks    Questions and concerns have been invited/addressed and the patient has a good understanding of the diagnosis, treatment plan, goals/expectations, and overall prognosis.    Theone Murdoch, PA-C        I saw and examined the patient.  I directly supervised the PA's activities and procedures.  I reviewed the PA's note.  I agree with the findings and plan of care as documented in the PA's note.    Arlyss Repress, MD  Assistant Professor-   Foot & Ankle Surgery  Department of [place] [facility]

## 2022-02-07 ENCOUNTER — Encounter (INDEPENDENT_AMBULATORY_CARE_PROVIDER_SITE_OTHER): Payer: Self-pay

## 2022-02-07 ENCOUNTER — Ambulatory Visit (INDEPENDENT_AMBULATORY_CARE_PROVIDER_SITE_OTHER): Payer: Commercial Managed Care - PPO | Admitting: NURSE PRACTITIONER

## 2022-02-07 ENCOUNTER — Other Ambulatory Visit: Payer: Self-pay

## 2022-02-07 ENCOUNTER — Telehealth (INDEPENDENT_AMBULATORY_CARE_PROVIDER_SITE_OTHER): Payer: Self-pay | Admitting: NURSE PRACTITIONER

## 2022-02-07 VITALS — BP 140/62 | HR 65 | Temp 97.3°F | Resp 20 | Ht 66.0 in | Wt 207.0 lb

## 2022-02-07 DIAGNOSIS — F17210 Nicotine dependence, cigarettes, uncomplicated: Secondary | ICD-10-CM

## 2022-02-07 DIAGNOSIS — J329 Chronic sinusitis, unspecified: Secondary | ICD-10-CM

## 2022-02-07 MED ORDER — PROMETHAZINE-DM 6.25 MG-15 MG/5 ML ORAL SYRUP
5.0000 mL | ORAL_SOLUTION | Freq: Four times a day (QID) | ORAL | 0 refills | Status: DC | PRN
Start: 2022-02-07 — End: 2022-07-17

## 2022-02-07 MED ORDER — AMOXICILLIN 875 MG-POTASSIUM CLAVULANATE 125 MG TABLET
1.0000 | ORAL_TABLET | Freq: Two times a day (BID) | ORAL | 0 refills | Status: AC
Start: 2022-02-07 — End: 2022-02-17

## 2022-02-07 NOTE — Progress Notes (Signed)
[address], [facility]  [address]  [place] [address]    Progress Note    Name: Valree Feild MRN:  [id number]   Date: 02/07/2022 Age: 56 y.o.            Chief complaint:   Cough, Sinus Problem, and Ear Fullness.    HPI:  The patient is a 56 y.o. old female who came in today for cough, nasal congestion, and ear fullness. Patient reports symptoms have been ongoing for over a week. Denies fever or any other symptoms.     ROS:  Review of Systems   Constitutional: Negative for chills, fatigue and fever.   HENT: Positive for congestion. Negative for ear pain, rhinorrhea, sinus pressure, sneezing and sore throat.         Ear fullness    Respiratory: Positive for cough. Negative for chest tightness, shortness of breath and wheezing.    Cardiovascular: Negative for chest pain and palpitations.   Gastrointestinal: Negative for abdominal pain, constipation, diarrhea, nausea and vomiting.   Skin: Negative for rash.   Neurological: Negative for dizziness and headaches.          Past medical history:  Past Medical History:   Diagnosis Date   . Anxiety    . Arthritis    . Back problem     DDD   . Beta blocker prescribed for left ventricular systolic dysfunction    . Blood thinned due to long-term anticoagulant use    . Carotid stenosis     occlusion of left ICA   . Chronic back pain    . Coronary artery disease involving native coronary artery of native heart    . DDD (degenerative disc disease), lumbar     "entire spine"   . Ear piercing    . GERD (gastroesophageal reflux disease)     controlled with omeprazole   . H/O complete eye exam     2 years, Dr. [name], at [facility]   . History of dental examination     dentures upper and lower   . HTN    . Hx of coronary artery bypass graft     2012   . Hx of gastric bypass    . Hypercholesterolemia    . Hyperlipidemia    . Hypothyroid    . MI (myocardial infarction) (CMS [place]) 2012   . Neck problem     herniated cervical disc   . Obesity (BMI 30-39.9)    .  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 [place] ordered.    . Stented coronary artery     x4   . Stroke (CMS [facility])     2002   . Tattoo     Left breast, Right shoulder   . Thyroid disorder    . Thyroid nodule    . Type 2 diabetes mellitus (CMS HCC)     HGA1C 10.4 on 12/19/21   . Uncontrolled type 2 diabetes mellitus, without long-term current use of insulin    . Xanthelasma          Past surgical history:  Past Surgical History:   Procedure Laterality Date   . ENDOMETRIAL ABLATION  1998   . HX ANKLE FRACTURE TX  1990s    Right, Doran Durand procedure   . HX CATARACT REMOVAL Bilateral 2013    r and l eyes with implants   . HX CESAREAN SECTION  06/20/2000    x3, 11/21/86, 11/10/84   . HX CHOLECYSTECTOMY  1988   . HX CORONARY ARTERY BYPASS GRAFT  03/31/2011    cardiac, 2 vessel, Dr. [name], [facility]   . HX CORONARY STENT PLACEMENT  2012    x4 stents   . HX GASTRIC SLEEVE  02/2017    [place], [place], Dr. Pearlie Oyster   . HX HEART CATHETERIZATION  2012    massive heart attack and stents - [facility]   . HX THYROID BIOPSY     . HX THYROIDECTOMY  101/01/2018   . HX TONSILLECTOMY      as child   . HX YAG Left 09/20/2013          Medications:  Current Outpatient Medications   Medication Sig   . ACCU-CHEK AVIVA PLUS TEST STRP Strip USE 1 STRIP TO TEST BLOOD SUGAR THREE TIMES A DAY   . amoxicillin-pot clavulanate (AUGMENTIN) 875-125 mg Oral Tablet Take 1 Tablet by mouth Twice daily for 10 days   . ascorbic acid, vitamin C, (VITAMIN C) 500 mg Oral Tablet Take 1 Tablet (500 mg total) by mouth Once a day for 90 days   . aspirin (ECOTRIN) 81 mg Oral Tablet, Delayed Release (E.C.) Take 1 Tablet (81 mg total) by mouth Once a day   . Blood-Glucose Meter (ACCU-CHEK AVIVA PLUS METER) Misc 1 Kit Three times a day   . clopidogreL (PLAVIX) 75 mg Oral Tablet TAKE 1 TABLET BY MOUTH ONCE DAILY   . cyanocobalamin (VITAMIN B12) 1,000 mcg/mL Injection Solution Inject 1 mL (1,000 mcg total) under the skin  Every 30 days   . cyanocobalamin, vitamin B-12, 1,000 mcg/mL Oral Drops Take 1 mL (1,000 mcg total) by mouth Every 7 days (Patient not taking: Reported on 01/29/2022)   . diclofenac sodium (VOLTAREN) 1 % Gel Apply 2 g topically Three times a day as needed   . docusate sodium (COLACE) 100 mg Oral Capsule Take 1 Capsule (100 mg total) by mouth Twice daily   . doxycycline monohydrate (MONODOX) 100 mg Oral Capsule Take 1 Capsule (100 mg total) by mouth Twice daily for 10 days   . empagliflozin (JARDIANCE) 10 mg Oral Tablet Take 1 Tablet (10 mg total) by mouth Once a day   . ergocalciferol, vitamin D2, (DRISDOL) 1,250 mcg (50,000 unit) Oral Capsule TAKE 1 CAPSULE BY MOUTH EVERY WEEK   . furosemide (LASIX) 40 mg Oral Tablet TAKE 1 TABLET(40 MG) BY MOUTH EVERY DAY   . guaiFENesin 100 mg/5 mL Oral Liquid Take 10 mL (200 mg total) by mouth Every 6 hours as needed (Patient not taking: Reported on 01/29/2022)   . HYDROcodone-acetaminophen (NORCO) 10-325 mg Oral Tablet Take 1 Tablet by mouth Every 8 hours as needed for Pain [name] with [facility]   . Lancets (ACCU-CHEK SOFTCLIX LANCETS) Misc To use daily   . levothyroxine (SYNTHROID) 25 mcg Oral Tablet Take 1 Tablet (25 mcg total) by mouth Once a day   . metFORMIN (GLUCOPHAGE-XR) 750 mg Oral Tablet Sustained Release 24 hr Take 1 Tablet (750 mg total) by mouth Twice daily with food Dosing to reduce GI symptoms. (Patient not taking: Reported on 12/27/2021)   . metoprolol succinate (TOPROL-XL) 25 mg Oral Tablet Sustained Release 24 hr Take 0.5 Tablets (12.5 mg total) by mouth Once a day   . NARCAN 4 mg/actuation Nasal Spray, Non-Aerosol PT STATED SHE HAS NARCAN AT HOME BUT IS NOT TAKING IT   . Needle, Disp, 25 G  25 gauge x 1" Needle 1 mL Every 30 days   . niacin (NIASPAN) 500 mg Oral Tablet Sustained Release Take 1 Tablet (500 mg total) by mouth Once a day   . nitroGLYCERIN (NITROSTAT) 0.4 mg Sublingual Tablet, Sublingual Place 1 Tablet (0.4 mg total) under  the tongue Every 5 minutes as needed for Chest pain for 3 doses over 15 minutes   . omeprazole (PRILOSEC) 40 mg Oral Capsule, Delayed Release(E.C.) TAKE 1 CAPSULE BY MOUTH EVERY DAY   . ondansetron (ZOFRAN ODT) 8 mg Oral Tablet, Rapid Dissolve DISSOLVE 1 TABLET(8 MG) ON THE TONGUE EVERY 8 HOURS AS NEEDED FOR NAUSEA OR VOMITING   . ondansetron (ZOFRAN) 4 mg Oral Tablet Take 1 Tablet (4 mg total) by mouth Every 8 hours as needed for Nausea/Vomiting   . PRENATAL PLUS, CALCIUM CARB, 27 mg iron- 1 mg Oral Tablet Take 1 Tablet by mouth Once a day   . promethazine-dextromethorphan (PHENERGAN-DM) 6.25-15 mg/5 mL Oral Syrup Take 5 mL by mouth Four times a day as needed for Cough   . rosuvastatin (CRESTOR) 40 mg Oral Tablet Take 1 Tablet (40 mg total) by mouth Once a day TAKE 1 TABLET BY MOUTH ONCE DAILY   . semaglutide (OZEMPIC) 0.25 mg or 0.5 mg(2 mg/1.5 mL) Subcutaneous Pen Injector Inject 0.5 mg under the skin Every 7 days   . sennosides-docusate sodium (SENOKOT-S) 8.6-50 mg Oral Tablet Take 1 Tablet by mouth Every evening   . venlafaxine (EFFEXOR XR) 37.5 mg Oral Capsule, Sust. Release 24 hr Take 1 Capsule (37.5 mg total) by mouth Once a day   . ZYRTEC-D 5-120 mg Oral Tablet Sustained Release 12 hr Take 1 Tablet by mouth Once a day     Allergies:  Allergies   Allergen Reactions   . Ane Payment [Pyrilamine-Dextromethorphan]  Other Adverse Reaction (Add comment)     LIGHT HEADED   . Other      Coban dressing caused skin break down   . Tylenol W Codeine [Acetaminophen-Codeine]  Other Adverse Reaction (Add comment)     pt states that one time she had chest pains with this med, but dr. thought it was because she had not eaten     Family history:  Family Medical History:     Problem Relation (Age of Onset)    Atrial fibrillation Sister    Colon Cancer Father    Diabetes Mother, Maternal Aunt    Heart Disease Sister, Maternal Grandfather    Hypertension (High Blood Pressure) Mother    Lung Cancer Maternal Grandfather    MI <61  years of age Mother          Social history:  Social History     Tobacco Use   . Smoking status: Every Day     Packs/day: 0.50     Years: 20.00     Pack years: 10.00     Types: Cigarettes     Start date: 40   . Smokeless tobacco: Never   Vaping Use   . Vaping Use: Never used   Substance Use Topics   . Alcohol use: No   . Drug use: No        There are no exam notes on file for this visit.    Vitals:    02/07/22 1337   BP: (!) 140/62   Pulse: 65   Resp: 20   Temp: 36.3 C (97.3 F)   SpO2: 96%   Weight: 93.9 kg (207 lb)  Height: 1.676 m (_0 )   BMI: 33.48        Physical Examination:  Physical Exam  Vitals and nursing note reviewed.   Constitutional:       General: She is not in acute distress.     Appearance: Normal appearance.   HENT:      Head: Normocephalic and atraumatic.      Right Ear: Tympanic membrane and ear canal normal.      Left Ear: Tympanic membrane and ear canal normal.      Nose: Congestion present. No rhinorrhea.      Right Sinus: Frontal sinus tenderness present.      Left Sinus: Frontal sinus tenderness present.      Mouth/Throat:      Mouth: Mucous membranes are moist.      Pharynx: Oropharynx is clear. No oropharyngeal exudate or posterior oropharyngeal erythema.   Eyes:      Extraocular Movements: Extraocular movements intact.      Pupils: Pupils are equal, round, and reactive to light.   Cardiovascular:      Rate and Rhythm: Normal rate and regular rhythm.      Pulses: Normal pulses.      Heart sounds: Normal heart sounds, S1 normal and S2 normal. No murmur heard.  Pulmonary:      Effort: Pulmonary effort is normal. No respiratory distress.      Breath sounds: Normal breath sounds. No wheezing or rhonchi.   Musculoskeletal:      Cervical back: Normal range of motion and neck supple. No tenderness.   Lymphadenopathy:      Cervical: No cervical adenopathy.   Skin:     General: Skin is warm and dry.   Neurological:      General: No focal deficit present.      Mental Status: She is alert and  oriented to person, place, and time.          Visit Diagnosis:    ICD-10-CM    1. Sinusitis, unspecified chronicity, unspecified location  J32.9            Plan:  Adequate hydration  Avoid respiratory irritants  Monitor for fever  Augmentin as prescribed   Promethazine DM as prescribed as needed for cough. Advised promethazine may cause drowsiness, verbalized understanding.  Follow-up with PCP if symptoms persist  Return to clinic if needed  ER for any new or worsening symptoms. Patient verbalized understanding.       This visit was rendered independently without a supervising/collaborating physician on site.         Jeffie Pollock, APRN  02/07/2022, 13:46  Electronically signed by Jeffie Pollock, APRN    This note was partially created using MModal Fluency Direct system (voice recognition software) and is inherently subject to errors including those of syntax and "sound-alike" substitutions which may escape proofreading.  In such instances, original meaning may be extrapolated by contextual derivation.

## 2022-02-07 NOTE — Telephone Encounter (Signed)
Patient called and reports she did not pick up the cough medication at the same time that she picked up her antibiotic. Patient reports that when she went back to pick up the cough medication, they told her that they could not fill the medication due to them putting the medication back.     Called pharmacy and spoke with staff. The pharmacy reports that they have the medication ready. He reports that she had told him that she did not want the medication at the first interaction and then called and changed her mind. He advised that he has the medication as ready to be picked up.     Patient was informed the the medication was ready and she voiced understanding.     Rosine Door, [place]

## 2022-03-06 ENCOUNTER — Other Ambulatory Visit (HOSPITAL_BASED_OUTPATIENT_CLINIC_OR_DEPARTMENT_OTHER): Payer: Commercial Managed Care - PPO

## 2022-03-06 ENCOUNTER — Encounter (INDEPENDENT_AMBULATORY_CARE_PROVIDER_SITE_OTHER): Payer: Self-pay | Admitting: Orthopaedic Surgery

## 2022-03-06 ENCOUNTER — Inpatient Hospital Stay (INDEPENDENT_AMBULATORY_CARE_PROVIDER_SITE_OTHER)
Admission: RE | Admit: 2022-03-06 | Discharge: 2022-03-06 | Disposition: A | Discharge status: Home / Self Care | Payer: Commercial Managed Care - PPO | Source: Ambulatory Visit

## 2022-03-06 ENCOUNTER — Ambulatory Visit: Payer: Commercial Managed Care - PPO | Attending: Orthopaedic Surgery | Admitting: Orthopaedic Surgery

## 2022-03-06 ENCOUNTER — Other Ambulatory Visit: Payer: Self-pay

## 2022-03-06 DIAGNOSIS — E1165 Type 2 diabetes mellitus with hyperglycemia: Secondary | ICD-10-CM | POA: Insufficient documentation

## 2022-03-06 DIAGNOSIS — Z4789 Encounter for other orthopedic aftercare: Secondary | ICD-10-CM

## 2022-03-06 DIAGNOSIS — S92919A Unspecified fracture of unspecified toe(s), initial encounter for closed fracture: Secondary | ICD-10-CM

## 2022-03-06 DIAGNOSIS — Z9889 Other specified postprocedural states: Secondary | ICD-10-CM

## 2022-03-06 DIAGNOSIS — R52 Pain, unspecified: Secondary | ICD-10-CM | POA: Insufficient documentation

## 2022-03-06 NOTE — Progress Notes (Signed)
[place]  [place], [place] [address]  O: [phone]  F: [phone]         [facility]        Name: Monique [name] MRN:  [id number]   Date: 03/06/2022 Age: 56 y.o.         Chief Complaint     Follow Up       Date of Surgery: 12/27/2021    Surgery:  Close reduction and percutaneous pinning middle phalanx comminuted fracture, 4th, left foot      History of Present Illness       Monique [name]  is a 56 y.o. who returns to clinic today 2 Weeks post the above mentioned procedure.  Overall, She is doing well.  She is Partial Weightbearing in a postoperative splint.  She has been using walker.. She has been taking aspirin and Plavix daily. Pain is well-controlled.  Patient had an instance where the dressing fell off.  The splint was kept in place. She denies any fever or chills.      01/30/22  Monique [name] is a very pleasant 56 year old female patient who returns to clinic today 4 weeks status post the above-mentioned procedure.  The patient is doing really well.  She has been compliant with heel weight-bearing as much as possible but not with the toe strapping.  She states that she was unable to tolerate toe strapping due to skin irritation from Coban.  The patient has been back on aspirin and Plavix.  Her pain is well controlled.  She reports that the swelling has subsided on her 4th toe.  She is taking Vitamin D 50,000 units weekly.  She denies fever or chills.     She does smoke 1/2 pack of cigarettes today, but she has been trying to decrease this.    She reports improved glucose control.  Her blood glucose levels have ranged from 140-193 over the past week.  She was recently switched from Metformin to Actos.     03/06/2022  Monique [name] is a very pleasant 56 y.o. female who returns to the clinic today 9 weeks s/p the above mentioned procedure.  Overall she is  doing well and reports no pain.  Swelling has improved as well.  She has been wearing a post-op shoe and buddy taping the 4th toe.  She continues to take Vitamin D.        Historical Data    Past Medical/Surgical History     Past Medical History:   Diagnosis Date   . Anxiety    . Arthritis    . Back problem     DDD   . Beta blocker prescribed for left ventricular systolic dysfunction    . Blood thinned due to long-term anticoagulant use    . Carotid stenosis     occlusion of left ICA   . Chronic back pain    . Coronary artery disease involving native coronary artery of native heart    . DDD (degenerative disc disease), lumbar     "entire spine"   . Ear piercing    . GERD (gastroesophageal reflux disease)     controlled with omeprazole   . H/O complete eye exam     2 years, Dr. [name], at [facility]   . History of dental examination     dentures upper and  lower   . HTN    . Hx of coronary artery bypass graft     2012   . Hx of gastric bypass    . Hypercholesterolemia    . Hyperlipidemia    . Hypothyroid    . MI (myocardial infarction) (CMS [place]) 2012   . Neck problem     herniated cervical disc   . Obesity (BMI 30-39.9)    . 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 [place] ordered.    . Stented coronary artery     x4   . Stroke (CMS [facility])     2002   . Tattoo     Left breast, Right shoulder   . Thyroid disorder    . Thyroid nodule    . Type 2 diabetes mellitus (CMS HCC)     HGA1C 10.4 on 12/19/21   . Uncontrolled type 2 diabetes mellitus, without long-term current use of insulin    . Xanthelasma             Past Surgical History:   Procedure Laterality Date   . ENDOMETRIAL ABLATION  1998   . HX ANKLE FRACTURE TX  1990s    Right, Doran Durand procedure   . HX CATARACT REMOVAL Bilateral 2013    r and l eyes with implants   . HX CESAREAN SECTION  06/20/2000    x3, 11/21/86, 11/10/84   . HX CHOLECYSTECTOMY  1988   . HX CORONARY ARTERY BYPASS GRAFT  03/31/2011    cardiac, 2 vessel,  Dr. [name], [facility]   . HX CORONARY STENT PLACEMENT  2012    x4 stents   . HX GASTRIC SLEEVE  02/2017    [place], [place], Dr. Pearlie Oyster   . HX HEART CATHETERIZATION  2012    massive heart attack and stents - [facility]   . HX THYROID BIOPSY     . HX THYROIDECTOMY  101/01/2018   . HX TONSILLECTOMY      as child   . HX YAG Left 09/20/2013              Past Family and Social History     Family Medical History:     Problem Relation (Age of Onset)    Atrial fibrillation Sister    Colon Cancer Father    Diabetes Mother, Maternal Aunt    Heart Disease Sister, Maternal Grandfather    Hypertension (High Blood Pressure) Mother    Lung Cancer Maternal Grandfather    MI <40 years of age Mother            Social History     Socioeconomic History   . Marital status: Married     Spouse name: [name]   . Number of children: 3   . Years of education: completed 11th grade   . Highest education level: Not on file   Occupational History   . Occupation: disabled   Tobacco Use   . Smoking status: Every Day     Packs/day: 0.50     Years: 20.00     Pack years: 10.00     Types: Cigarettes     Start date: 1   . Smokeless tobacco: Never   Vaping Use   . Vaping Use: Never used   Substance and Sexual Activity   . Alcohol use: No   . Drug use: No   . Sexual activity: Yes     Partners: Male  Other Topics Concern   . Abuse/Domestic Violence Not Asked   . Breast Self Exam Not Asked   . Caffeine Concern Not Asked   . Calcium intake adequate Not Asked   . Computer Use Not Asked   . Drives Not Asked   . Exercise Concern Not Asked   . Helmet Use Not Asked   . Seat Belt Not Asked   . Special Diet Not Asked   . Sunscreen used Not Asked   . Uses Cane Not Asked   . Uses walker Not Asked   . Uses wheelchair Not Asked   . Right hand dominant Not Asked   . Left hand dominant Not Asked   . Ambidextrous Not Asked   . Shift Work Not Asked   . Unusual Sleep-Wake Schedule Not Asked   . Ability to Walk 1 Flight of Steps without SOB/CP Yes   . Routine Exercise No   .  Ability to Walk 2 Flight of Steps without SOB/CP Yes   . Unable to Ambulate Not Asked   . Total Care Not Asked   . Ability To Do Own ADL's Yes   . Uses Walker No   . Other Activity Level Yes     Comment: on the go all the time - does cleaning, grocery shopping   . Uses Cane No   Social History Narrative    Living situation: lives at home with husband, [name], and daughter.  1 dog named Duke and 1 cat named Lucky.    Nutrition:  Eats 5 small meals d/t gastric sleeve surgery.     Caffeine use: none, decaf coffee    Exercise: stays busy around house. No formal form of exercise    Seatbelt use: sometimes    Fire extinguishers in the home: yes    Smoke alarms in the home: yes    Carbon monoxide detectors in the home: yes    Nancy Fetter exposure: sunglasses        Monique [name] would like for husband, Monique [name],  to speak for her in the event she would become incapacitated.    Full code.      Social Determinants of Health     Financial Resource Strain: Not on file   Transportation Needs: Not on file   Social Connections: Not on file   Intimate Partner Violence: Not on file   Housing Stability: Not on file        Medications and Allergies     Home Medications:    Current Outpatient Medications   Medication Instructions   . ACCU-CHEK AVIVA PLUS TEST STRP Strip USE 1 STRIP TO TEST BLOOD SUGAR THREE TIMES A DAY   . ascorbic acid (vitamin C) (VITAMIN C) 500 mg, Oral, DAILY   . aspirin (ECOTRIN) 81 mg, Oral, DAILY   . Blood-Glucose Meter (ACCU-CHEK AVIVA PLUS METER) Misc 1 Kit, Does not apply, 3 TIMES DAILY   . clopidogreL (PLAVIX) 75 mg Oral Tablet TAKE 1 TABLET BY MOUTH ONCE DAILY   . cyanocobalamin (vitamin B-12) 1,000 mcg, Oral, EVERY 7 DAYS   . cyanocobalamin (VITAMIN B12) 1,000 mcg, Subcutaneous, EVERY 30 DAYS   . diclofenac sodium (VOLTAREN) 2 g, Apply Topically, 3 TIMES DAILY PRN   . docusate sodium (COLACE) 100 mg, Oral, 2 TIMES DAILY   . empagliflozin (JARDIANCE) 10 mg, Oral, DAILY   . ergocalciferol, vitamin D2, (DRISDOL) 1,250  mcg (50,000 unit) Oral Capsule TAKE 1 CAPSULE BY MOUTH EVERY WEEK   . furosemide (LASIX)  40 mg Oral Tablet TAKE 1 TABLET(40 MG) BY MOUTH EVERY DAY   . guaiFENesin 200 mg, Oral, EVERY 6 HOURS PRN   . HYDROcodone-acetaminophen (NORCO) 10-325 mg Oral Tablet 1 Tablet, Oral, EVERY 8 HOURS PRN, [name]   . Lancets (ACCU-CHEK SOFTCLIX LANCETS) Misc To use daily   . levothyroxine (SYNTHROID) 25 mcg, Oral, DAILY   . metformin (GLUCOPHAGE-XR) 750 mg, Oral, 2 TIMES DAILY WITH FOOD, Dosing to reduce GI symptoms.   . metoprolol succinate (TOPROL-XL) 12.5 mg, Oral, DAILY   . NARCAN 4 mg/actuation Nasal Spray, Non-Aerosol PT STATED SHE HAS NARCAN AT HOME BUT IS NOT TAKING IT   . Needle, Disp, 25 G 25 gauge x 1" Needle 1 mL, Does not apply, EVERY 30 DAYS   . niacin (NIASPAN) 500 mg, Oral, DAILY   . nitroGLYCERIN (NITROSTAT) 0.4 mg, Sublingual, EVERY 5 MIN PRN, for 3 doses over 15 minutes   . omeprazole (PRILOSEC) 40 mg Oral Capsule, Delayed Release(E.C.) TAKE 1 CAPSULE BY MOUTH EVERY DAY   . ondansetron (ZOFRAN ODT) 8 mg Oral Tablet, Rapid Dissolve DISSOLVE 1 TABLET(8 MG) ON THE TONGUE EVERY 8 HOURS AS NEEDED FOR NAUSEA OR VOMITING   . ondansetron (ZOFRAN) 4 mg, Oral, EVERY 8 HOURS PRN   . Ozempic 0.5 mg, Subcutaneous, EVERY 7 DAYS   . PRENATAL PLUS, CALCIUM CARB, 27 mg iron- 1 mg Oral Tablet 1 Tablet, Oral, DAILY   . promethazine-dextromethorphan (PHENERGAN-DM) 6.25-15 mg/5 mL Oral Syrup 5 mL, Oral, 4 TIMES DAILY PRN   . rosuvastatin (CRESTOR) 40 mg, Oral, DAILY, TAKE 1 TABLET BY MOUTH ONCE DAILY   . sennosides-docusate sodium (SENOKOT-S) 8.6-50 mg Oral Tablet 1 Tablet, Oral, EVERY EVENING   . venlafaxine (EFFEXOR XR) 37.5 mg, Oral, DAILY   . ZYRTEC-D 5-120 mg Oral Tablet Sustained Release 12 hr 1 Tablet, Oral, DAILY       Allergies:  Allergies   Allergen Reactions   . Ane Payment [Pyrilamine-Dextromethorphan]  Other Adverse Reaction (Add comment)     LIGHT HEADED   . Other      Coban dressing  caused skin break down   . Tylenol W Codeine [Acetaminophen-Codeine]  Other Adverse Reaction (Add comment)     pt states that one time she had chest pains with this med, but dr. thought it was because she had not eaten             Review of Systems     A 14-point comprehensive ROS is reviewed with the patient today by my CMA/myself, and remains unchanged from prior, as documented on the original encounter date, except for pertinent musculoskeletal elements identified above in the HPI.    This ROS includes the following systems: Constitutional, HEENT, Eyes, Respiratory, Cardiovascular, GastrointestinaI, Endocrine, Genitourinary, Musculoskeletal, Allergy/Immuno, Neurological, Hematologic, Behavorial, and Skin.      Physical Examination + Relevant Imaging/Labs/Other Studies     General:  Well-developed, no acute distress  Neurological:  Oriented to person, place, and time.  Psychological:  Normal mood and affect.  Eyes: Pupils equal, round with synchronous movement; sclera white  Respiratory: Non-labored breathing, symmetric chest wall rise      Focused Foot/Ankle (surgical side)  Gait: Stable without assistive device     Neurovascular: Foot warm and well perfused.  2+ dorsalis pedis and posterior tibial pulses.  Brisk capillary refill. Sensation intact over the dorsal and plantar aspects of the foot.        Inspection/Skin:   No  erythema or drainage.  No indication of infection.  No wound dishiscence.     Tenderness: no tenderness to light touch    Range of Motion: deferred    Strength: deferred    Stability: deferred        Imaging   Radiographs:  3V WB of the left foot are ordered, obtained and independently interpreted by Dr. Iris Pert today and reveal increased interval healing of 4th middle phalanx fracture.    3V NWB of the left footare ordered, obtained, and independently reviewed/interpreted by Dr. Iris Pert previously and reveal distal migration of the pin out of the proximal phalanx.      Assessment and Plan        ICD-10-CM    1. Phalanx of the foot fracture  S92.919A       2. Post-operative state  Z98.890       3. Type 2 diabetes mellitus with hyperglycemia (CMS HCC)  E11.65       4. Pain  R52 XR FOOT WEIGHT BEARING LT     CANCELED: XR FOOT WEIGHT BEARING RT           Aahana Elza returns for Her postoperative visit, 9 weeks status post CRPP 4th middle phalanx fracture.  Swelling has significantly decreased and she is no-longer tender at the fracture site.  She may transition into regular shoes.  She should wear supportive stiff-soled shoes.  She should continue to take Vitamin D.        We dicussed the importance of proper foot care due to diabetes.  She should not walk barefoot.  She should check her feet for wounds daily.  She should see her PCP or podiatrist for diabetic foot checks yearly.  She was encouraged to continue to improve glucose control and smoking cessation.    - She will follow up: prn    Questions and concerns have been invited/addressed and the patient has a good understanding of the diagnosis, treatment plan, goals/expectations, and overall prognosis.      Monique Murdoch, PA-C  03/06/2022, 14:14      I saw and examined the patient.  I directly supervised the PA's activities and procedures.  I reviewed the PA's note.  I agree with the findings and plan of care as documented in the PA's note.  Radiographs:  Images reviewed by me on the day of the encounter revealed interval healing of the 4th toe middle phalanx fracture.    My findings are consistent with:  Assessment/Plan   1. Phalanx of the foot fracture    2. Post-operative state    3. Type 2 diabetes mellitus with hyperglycemia (CMS HCC)    4. Pain        Assessment and plan:   Mrs. Biggar is a very pleasant 56 year old female patient returns to clinic today, accompanied by her husband, 9 weeks status post closed reduction and percutaneous pinning of the 4th toe middle phalanx fracture.  The patient's symptoms have significantly improved.  The  pin was spontaneously pulled out at that 2 weeks postop.  The swelling has significantly improved.  The patient is not tender at the 4th toe PIP joint.  I have recommended transition into regular stiff shoes.  I have also recommended the regular diabetic foot checks and tighter glucose control with smoking cessation.  We will see her back in 3 months or as needed if patient is doing really well.      Arlyss Repress, MD  Assistant Professor-  Foot & Ankle Surgery  Department of [facility] [employer]

## 2022-03-18 ENCOUNTER — Other Ambulatory Visit: Payer: Self-pay

## 2022-03-18 DIAGNOSIS — R52 Pain, unspecified: Secondary | ICD-10-CM

## 2022-03-18 DIAGNOSIS — Z9889 Other specified postprocedural states: Secondary | ICD-10-CM

## 2022-03-19 ENCOUNTER — Encounter (INDEPENDENT_AMBULATORY_CARE_PROVIDER_SITE_OTHER): Payer: Self-pay | Admitting: Family Medicine

## 2022-03-19 ENCOUNTER — Ambulatory Visit (INDEPENDENT_AMBULATORY_CARE_PROVIDER_SITE_OTHER): Payer: Commercial Managed Care - PPO | Admitting: Family Medicine

## 2022-03-19 VITALS — BP 110/64 | HR 62 | Resp 18 | Ht 66.0 in | Wt 203.0 lb

## 2022-03-19 DIAGNOSIS — F329 Major depressive disorder, single episode, unspecified: Secondary | ICD-10-CM

## 2022-03-19 DIAGNOSIS — J309 Allergic rhinitis, unspecified: Secondary | ICD-10-CM

## 2022-03-19 DIAGNOSIS — E1165 Type 2 diabetes mellitus with hyperglycemia: Secondary | ICD-10-CM

## 2022-03-19 DIAGNOSIS — K219 Gastro-esophageal reflux disease without esophagitis: Secondary | ICD-10-CM

## 2022-03-19 DIAGNOSIS — I25810 Atherosclerosis of coronary artery bypass graft(s) without angina pectoris: Secondary | ICD-10-CM

## 2022-03-19 NOTE — Nursing Note (Signed)
03/19/22 1000   PHQ 9 (follow up)   Little interest or pleasure in doing things. 0   Feeling down, depressed, or hopeless 0   Trouble falling or staying asleep, or sleeping too much. 0   Feeling tired or having little energy 0   Poor appetite or overeating 0   Feeling bad about yourself/ that you are a failure in the past 2 weeks? 0   Trouble concentrating on things in the past 2 weeks? 0   Moving/Speaking slowly or being fidgety or restless  in the past 2 weeks? 0   Thoughts that you would be better off DEAD, or of hurting yourself in some way. 0   If you checked off any problems, how difficult have these problems made it for you to do your work, take care of things at home, or get along with other people? Not difficult at all   PHQ 9 Total 0

## 2022-03-19 NOTE — Progress Notes (Signed)
FAMILY MEDICINE, [facility]  [place] [address]       Name: Monique [name] MRN:  [id number]   Date: 03/19/2022 Age: 56 y.o.     Chief complaint: The patient is a 56 y.o. old female who came in today for Diabetes (10.4 last a1c)    HPI:    Diabetes: Last HbA1C was 10.4%.  Up from 9% previously.  She is due for new A1c.  Patient denies symptoms of polydipsia, polyuria, hypoglycemic unawareness, erectile dysfunction, chest pain, dyspnea on exertion, diarrhea, foot ulcerations, paresthesias. Symptoms are not changed. Patient's home blood sugars are poorly controlled. Patient is compliant with medications.  She reports the metformin has been stopped and she is now on Actos., she is also on Jardiance, and Ozempic, statin therapy and aspirin.  She had labs completed in January 2023.    Surgery:  Left foot surgery 12/27/2021.  She had pinning of the 4th middle phalanx after a fracture.  Had follow-up with her surgeon on 03/06/2022.  She has been transition to regular shoes.  Recommended ongoing use of vitamin-D.  Her surgeon discussed with her the role of diabetes in her foot breakdown.    Since our last encounter she has been to [facility] for symptoms of sinusitis she was treated with Augmentin.    ROS:  Review of systems completed with all positives and pertinent negatives as per HPI. Otherwise, negative.    Past medical history:  Past Medical History:   Diagnosis Date   . Anxiety    . Arthritis    . Back problem     DDD   . Beta blocker prescribed for left ventricular systolic dysfunction    . Blood thinned due to long-term anticoagulant use    . Carotid stenosis     occlusion of left ICA   . Chronic back pain    . Coronary artery disease involving native coronary artery of native heart    . DDD (degenerative disc disease), lumbar     "entire spine"   . Ear piercing    . GERD (gastroesophageal reflux disease)     controlled with omeprazole   . H/O complete eye exam     2  years, Dr. [name], at [facility]   . History of dental examination     dentures upper and lower   . HTN    . Hx of coronary artery bypass graft     2012   . Hx of gastric bypass    . Hypercholesterolemia    . Hyperlipidemia    . Hypothyroid    . MI (myocardial infarction) (CMS [place]) 2012   . Neck problem     herniated cervical disc   . Obesity (BMI 30-39.9)    . 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 [place] ordered.    . Stented coronary artery     x4   . Stroke (CMS [facility])     2002   . Tattoo     Left breast, Right shoulder   . Thyroid disorder    . Thyroid nodule    . Type 2 diabetes mellitus (CMS HCC)     HGA1C 10.4 on 12/19/21   . Uncontrolled type 2 diabetes mellitus, without long-term current use of insulin    . Xanthelasma      Past surgical history:  Past Surgical History:   Procedure Laterality Date   .  ENDOMETRIAL ABLATION  1998   . HX ANKLE FRACTURE TX  1990s    Right, Doran Durand procedure   . HX CATARACT REMOVAL Bilateral 2013    r and l eyes with implants   . HX CESAREAN SECTION  06/20/2000    x3, 11/21/86, 11/10/84   . HX CHOLECYSTECTOMY  1988   . HX CORONARY ARTERY BYPASS GRAFT  03/31/2011    cardiac, 2 vessel, Dr. [name], [facility]   . HX CORONARY STENT PLACEMENT  2012    x4 stents   . HX GASTRIC SLEEVE  02/2017    [place], [place], Dr. Pearlie Oyster   . HX HEART CATHETERIZATION  2012    massive heart attack and stents - [facility]   . HX THYROID BIOPSY     . HX THYROIDECTOMY  101/01/2018   . HX TONSILLECTOMY      as child   . HX YAG Left 09/20/2013     Medications:  Current Outpatient Medications   Medication Sig   . ACCU-CHEK AVIVA PLUS TEST STRP Strip USE 1 STRIP TO TEST BLOOD SUGAR THREE TIMES A DAY   . ascorbic acid, vitamin C, (VITAMIN C) 500 mg Oral Tablet Take 1 Tablet (500 mg total) by mouth Once a day for 90 days   . aspirin (ECOTRIN) 81 mg Oral Tablet, Delayed Release (E.C.) Take 1 Tablet (81 mg total) by mouth Once a day   . Blood-Glucose Meter (ACCU-CHEK  AVIVA PLUS METER) Misc 1 Kit Three times a day   . clopidogreL (PLAVIX) 75 mg Oral Tablet TAKE 1 TABLET BY MOUTH ONCE DAILY   . cyanocobalamin (VITAMIN B12) 1,000 mcg/mL Injection Solution Inject 1 mL (1,000 mcg total) under the skin Every 30 days   . diclofenac sodium (VOLTAREN) 1 % Gel Apply 2 g topically Three times a day as needed   . docusate sodium (COLACE) 100 mg Oral Capsule Take 1 Capsule (100 mg total) by mouth Twice daily   . empagliflozin (JARDIANCE) 10 mg Oral Tablet Take 1 Tablet (10 mg total) by mouth Once a day   . ergocalciferol, vitamin D2, (DRISDOL) 1,250 mcg (50,000 unit) Oral Capsule TAKE 1 CAPSULE BY MOUTH EVERY WEEK   . furosemide (LASIX) 40 mg Oral Tablet TAKE 1 TABLET(40 MG) BY MOUTH EVERY DAY   . HYDROcodone-acetaminophen (NORCO) 10-325 mg Oral Tablet Take 1 Tablet by mouth Every 8 hours as needed for Pain [name] with [facility]   . Lancets (ACCU-CHEK SOFTCLIX LANCETS) Misc To use daily   . levothyroxine (SYNTHROID) 25 mcg Oral Tablet Take 1 Tablet (25 mcg total) by mouth Once a day   . metoprolol succinate (TOPROL-XL) 25 mg Oral Tablet Sustained Release 24 hr Take 0.5 Tablets (12.5 mg total) by mouth Once a day   . NARCAN 4 mg/actuation Nasal Spray, Non-Aerosol PT STATED SHE HAS NARCAN AT HOME BUT IS NOT TAKING IT   . Needle, Disp, 25 G 25 gauge x 1" Needle 1 mL Every 30 days   . niacin (NIASPAN) 500 mg Oral Tablet Sustained Release Take 1 Tablet (500 mg total) by mouth Once a day   . nitroGLYCERIN (NITROSTAT) 0.4 mg Sublingual Tablet, Sublingual Place 1 Tablet (0.4 mg total) under the tongue Every 5 minutes as needed for Chest pain for 3 doses over 15 minutes   . omeprazole (PRILOSEC) 40 mg Oral Capsule, Delayed Release(E.C.) TAKE 1 CAPSULE BY MOUTH EVERY DAY   . ondansetron (ZOFRAN ODT) 8 mg Oral Tablet, Rapid Dissolve  DISSOLVE 1 TABLET(8 MG) ON THE TONGUE EVERY 8 HOURS AS NEEDED FOR NAUSEA OR VOMITING   . ondansetron (ZOFRAN) 4 mg Oral Tablet Take 1 Tablet (4 mg  total) by mouth Every 8 hours as needed for Nausea/Vomiting   . PRENATAL PLUS, CALCIUM CARB, 27 mg iron- 1 mg Oral Tablet Take 1 Tablet by mouth Once a day   . promethazine-dextromethorphan (PHENERGAN-DM) 6.25-15 mg/5 mL Oral Syrup Take 5 mL by mouth Four times a day as needed for Cough   . rosuvastatin (CRESTOR) 40 mg Oral Tablet Take 1 Tablet (40 mg total) by mouth Once a day TAKE 1 TABLET BY MOUTH ONCE DAILY   . semaglutide (OZEMPIC) 0.25 mg or 0.5 mg(2 mg/1.5 mL) Subcutaneous Pen Injector Inject 0.5 mg under the skin Every 7 days   . sennosides-docusate sodium (SENOKOT-S) 8.6-50 mg Oral Tablet Take 1 Tablet by mouth Every evening   . venlafaxine (EFFEXOR XR) 37.5 mg Oral Capsule, Sust. Release 24 hr Take 1 Capsule (37.5 mg total) by mouth Once a day   . ZYRTEC-D 5-120 mg Oral Tablet Sustained Release 12 hr Take 1 Tablet by mouth Once a day     Vitals:    03/19/22 1005   BP: 110/64   Pulse: 62   Resp: 18   SpO2: 96%   Weight: 92.1 kg (203 lb)   Height: 1.676 m (5' 6" )   BMI: 32.83         Three year weight curve.    Physical Examination:    GENERAL:   Pt is a pleasant, well-nourished, well-developed 56 y.o. female who is in NAD. Appears stated age  3:  head normocephalic, symmetrical facies. EOM intact b/l. PERRLA. Sclera non-icteric, non-injected. upper eyelid w/Xythoma-lipid plaques, diminished in fullness and intensity. Oropharyngeal mucous membranes are moist  w/o erythema/exudates   NECK:   No masses, no lymphadenopathy, no JVD on exam. Trachea midline, no thyromegaly   CV: S1, S2. No murmurs, rubs, gallops.     LUNGS: CTAB, No rhonchi, rales, wheezes.   GI: (+) BS in all 4 quadrants. soft, NT/ND. No rigidity/positive for guarding/negative for rebound. No organomegaly/masses, or abdominal bruits,   MSK: Spontaneous normal AROM of major joints as observed during exam. No joint effusions/ swelling/ deformities.   Fourth digit he of the left lower extremity is swollen and erythematous.  Tender to touch.  No  BLE edema, clubbing, or cyanosis.   2+ radial pulse present b/l.   + 2 reflexes Brachioradialis and patellar bilaterally.   Gait normal.  SKIN: No significant lesions, rashes, ecchymoses noted.   NEURO: AAOx4, CN grossly intact. Sensation intact b/l. No focal deficits.  PSYCH: Mood, behavior and affect normal w/ Intact judgement & insight     03/19/22 1000   PHQ 9 (follow up)   Little interest or pleasure in doing things. 0   Feeling down, depressed, or hopeless 0   Trouble falling or staying asleep, or sleeping too much. 0   Feeling tired or having little energy 0   Poor appetite or overeating 0   Feeling bad about yourself/ that you are a failure in the past 2 weeks? 0   Trouble concentrating on things in the past 2 weeks? 0   Moving/Speaking slowly or being fidgety or restless  in the past 2 weeks? 0   Thoughts that you would be better off DEAD, or of hurting yourself in some way. 0   If you checked off any problems, how difficult have  these problems made it for you to do your work, take care of things at home, or get along with other people? Not difficult at all   PHQ 9 Total 0       Diabetes Monitors    CBC  Diff   Lab Results   Component Value Date/Time    WBC 9.8 12/17/2021 02:14 PM    HGB 14.3 12/17/2021 02:14 PM    HCT 42.7 12/17/2021 02:14 PM    PLTCNT 399 12/17/2021 02:14 PM    RBC 4.78 12/17/2021 02:14 PM    MCV 89.3 12/17/2021 02:14 PM    MCHC 33.5 12/17/2021 02:14 PM    MCH 29.9 12/17/2021 02:14 PM    RDW 14.2 05/08/2021 12:25 PM    MPV 9.6 12/17/2021 02:14 PM    Lab Results   Component Value Date/Time    PMNS 47 05/08/2021 12:25 PM    LYMPHOCYTES 40 05/08/2021 12:25 PM    EOSINOPHIL 2 05/08/2021 12:25 PM    MONOCYTES 12 05/08/2021 12:25 PM    BASOPHILS 1 05/08/2021 12:25 PM    BASOPHILS 0.05 05/08/2021 12:25 PM    PMNABS 4.25 05/08/2021 12:25 PM    LYMPHSABS 3.62 05/08/2021 12:25 PM    EOSABS 0.16 05/08/2021 12:25 PM    MONOSABS 1.07 (H) 05/08/2021 12:25 PM    BASOSABS 0.034 04/01/2011 05:08 AM           COMPREHENSIVE METABOLIC PANEL - NON FASTING  Lab Results   Component Value Date    SODIUM 137 12/17/2021    POTASSIUM 4.0 12/17/2021    CHLORIDE 105 12/17/2021    CO2 26 12/17/2021    ANIONGAP 6 12/17/2021    BUN 11 12/17/2021    CREATININE 0.98 12/17/2021    GLUCOSENF 200 (H) 12/17/2021    CALCIUM 9.1 12/17/2021    ALBUMIN 3.6 12/17/2021    TOTALPROTEIN 6.6 12/17/2021    ALKPHOS 111 12/17/2021    AST 13 12/17/2021    ALT 13 12/17/2021       Visit Diagnosis    Encounter Diagnoses   Name Primary?   . Type 2 diabetes mellitus with hyperglycemia, without long-term current use of insulin (CMS HCC) Yes     No orders of the defined types were placed in this encounter.    Type 2 diabetes mellitus with complication, without long-term current use of insulin (CMS HCC)  Chronic worsening  control  HbA1C has been trending up.  Follow-up with Dr. Alvino Chapel  continue diabetic diet.  Continue to address weight loss which is stalled.  Stop metformin for diarrhea.  Specific does based on A1c.    She will likely need metformin 1000 mg XR, b.i.d.  Continue Ozempic.  Prescribed but not using Jardiance.  Stopped Glyxambi 10-5 mg.    Encouraged her to check blood sugars 3-4 times daily.  Discuss renal concerns and need for monitoring.   A1c ordered as above.    Left lower extremity 4th digit fracture  Continue with orthopedics.  Successful completion of surgery in pending.  Continue per their recommendations.    She is now weight-bearing.  No special footwear required    Allergic rhinitis:  Continue histamine blocker.  Will add pseudoephedrine.    Coronary artery disease  Status post CABG  Continue Plavix  Continue beta-blocker  Continue statin.  Continue sublingual nitro p.r.n. for chest pain.  Continue with Cardiology.    Depression, unspecified depression type  Chronic, controlled.  See PHQ above.   Continue  Effexor    Weight loss:  Weight loss has stabilized.  Continue behavioral efforts.      GERD  Chronic, Uncontrolled  problem   Encouraged lifestyle modification to help with acid reflux, including:   Weight loss   Avoid eating for 2-3 hours prior to bedtime. Avoid late night snacking.    Avoid hot/spicy and acidic foods and overeating.    Remain upright after eating, avoid tight clothing.    Elevate head of bed.   Chew gum or lozenges.   Refrain from alcohol and smoking.   Continue histamine blocker.  Continue  proton pump inhibitor.  Continue General surgery.  She will attend to the emergency room if her symptoms warrant prior to that encounter.    Chronic left-sided lower extremity pain  Stable to improved.  Current diarrhea is felt to be the result of metformin.  Copies of the results are offered to the patient.  Patient with osteoarthritis  Has comorbid peripheral vascular disease  Will increase her statin follow.  Patient has follow-ups with orthopedics for the hip and knee.  Continue follow-up with Dr. Burman Riis in [facility] for low back pain.    We again discussed use of weight loss as a primary method for pain relief.  Continue on muscle relaxer.  Patient avoids nonsteroidals secondary to her bariatric surgery.  Labs have been reassuring.    Carotid Art Stenosis:   50% stenosis to the right ICA   100% stenosis to left ICA.  Continue with Cardiology, Interventional Cardiology  Has had follow-ups with vascular surgery scheduled but has not completed those encounters.    Will follow.    Right facial nevus  Continue follow-up with derm in [place].     OSA:  Could not keep appt for sleep study,   Continue with Neurology,   missed last appointment on 08/05/2021.      Return in about 3 months (around 06/18/2022), or if symptoms worsen or fail to improve, for In Person Visit, DM.    Cherly Beach, MD  03/19/2022, 10:27  This note was partially generated using MModal Fluency Direct system, and there may be some incorrect words, spellings, and punctuation that were not noted in checking the note before saving.    PDMP is  reviewed today.

## 2022-03-31 ENCOUNTER — Other Ambulatory Visit (INDEPENDENT_AMBULATORY_CARE_PROVIDER_SITE_OTHER): Payer: Self-pay | Admitting: Family Medicine

## 2022-03-31 DIAGNOSIS — R110 Nausea: Secondary | ICD-10-CM

## 2022-04-08 ENCOUNTER — Telehealth (INDEPENDENT_AMBULATORY_CARE_PROVIDER_SITE_OTHER): Payer: Self-pay | Admitting: Family Medicine

## 2022-04-08 DIAGNOSIS — M545 Low back pain, unspecified: Secondary | ICD-10-CM

## 2022-04-08 NOTE — Telephone Encounter (Signed)
Dr Jeanette Caprice is questioning if Dr Russ Halo would order some lumbar x-rays of the patient. She has had 4 visits with Dr Jeanette Caprice and has not responded to treatment. Due to insurance, Dr Jeanette Caprice cannot order the x-rays.

## 2022-04-10 ENCOUNTER — Other Ambulatory Visit: Payer: Self-pay

## 2022-04-10 ENCOUNTER — Inpatient Hospital Stay
Admission: RE | Admit: 2022-04-10 | Discharge: 2022-04-10 | Disposition: A | Discharge status: Home / Self Care | Payer: Commercial Managed Care - PPO | Source: Ambulatory Visit | Attending: Family Medicine | Admitting: Family Medicine

## 2022-04-10 DIAGNOSIS — M545 Low back pain, unspecified: Secondary | ICD-10-CM

## 2022-04-11 NOTE — Result Encounter Note (Signed)
Please inform the patient that the x-ray of her low back showed osteoarthritis.  It also showed extensive amount of atherosclerosis.  Both of these are chronic conditions no acute process was noted.    I am making no changes to her plan of care.      We will review this together next appointment.      TY TH

## 2022-04-19 ENCOUNTER — Encounter (INDEPENDENT_AMBULATORY_CARE_PROVIDER_SITE_OTHER): Payer: Self-pay

## 2022-04-19 ENCOUNTER — Other Ambulatory Visit: Payer: Self-pay

## 2022-04-19 ENCOUNTER — Ambulatory Visit (INDEPENDENT_AMBULATORY_CARE_PROVIDER_SITE_OTHER): Payer: Commercial Managed Care - PPO | Admitting: Family Medicine

## 2022-04-19 VITALS — BP 118/62 | HR 73 | Temp 98.6°F | Resp 18 | Ht 66.0 in | Wt 215.1 lb

## 2022-04-19 DIAGNOSIS — J40 Bronchitis, not specified as acute or chronic: Secondary | ICD-10-CM

## 2022-04-19 DIAGNOSIS — R0981 Nasal congestion: Secondary | ICD-10-CM

## 2022-04-19 MED ORDER — PROMETHAZINE-DM 6.25 MG-15 MG/5 ML ORAL SYRUP
5.0000 mL | ORAL_SOLUTION | Freq: Four times a day (QID) | ORAL | 0 refills | Status: AC | PRN
Start: 2022-04-19 — End: 2022-04-26

## 2022-04-19 MED ORDER — DOXYCYCLINE MONOHYDRATE 100 MG CAPSULE
100.0000 mg | ORAL_CAPSULE | Freq: Two times a day (BID) | ORAL | 0 refills | Status: AC
Start: 2022-04-19 — End: 2022-04-29

## 2022-04-19 MED ORDER — FLUTICASONE PROPIONATE 50 MCG/ACTUATION NASAL SPRAY,SUSPENSION
2.0000 | Freq: Every day | NASAL | 0 refills | Status: DC | PRN
Start: 2022-04-19 — End: 2022-08-22

## 2022-04-19 NOTE — Progress Notes (Signed)
Department of [facility]  [facility] [place] [place] [facility]  [employer]  [place] [address]  [phone]    Patient Name: Monique [name]   Age:56 y.o.   Sex: female   DOB: 1966/06/12   MRN: [id number]    Subjective:   Congestion (C/o cough and congestion x 1 week; has been taking OTC mucinex with no relief; coughing up yellow mucus; )     Past Medical History:   Diagnosis Date   . Anxiety    . Arthritis    . Back problem     DDD   . Beta blocker prescribed for left ventricular systolic dysfunction    . Blood thinned due to long-term anticoagulant use    . Carotid stenosis     occlusion of left ICA   . Chronic back pain    . Coronary artery disease involving native coronary artery of native heart    . DDD (degenerative disc disease), lumbar     "entire spine"   . Ear piercing    . GERD (gastroesophageal reflux disease)     controlled with omeprazole   . H/O complete eye exam     2 years, Dr. [name], at [facility]   . History of dental examination     dentures upper and lower   . HTN    . Hx of coronary artery bypass graft     2012   . Hx of gastric bypass    . Hypercholesterolemia    . Hyperlipidemia    . Hypothyroid    . MI (myocardial infarction) (CMS [place]) 2012   . Neck problem     herniated cervical disc   . Obesity (BMI 30-39.9)    . 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 [place] ordered.    . Stented coronary artery     x4   . Stroke (CMS [facility])     2002   . Tattoo     Left breast, Right shoulder   . Thyroid disorder    . Thyroid nodule    . Type 2 diabetes mellitus (CMS HCC)     HGA1C 10.4 on 12/19/21   . Uncontrolled type 2 diabetes mellitus, without long-term current use of insulin    . Xanthelasma          Objective :  BP 118/62   Pulse 73   Temp 37 C (98.6 F) (Tympanic)   Resp 18   Ht 1.676 m ('5\' 6"'$ )   Wt 97.6 kg (215 lb 1.6 oz)   LMP  (LMP Unknown)   SpO2 96%   BMI 34.72 kg/m        General: Alert, in NAD  ENT: Ears: EAC's normal, TM's clear. Nose: Mild nasal coryza and No sinus tenderness Throat: Clear  Neck: no thyromegaly or lymphadenopathy  Lungs: Clear to auscultation bilaterally.   Cardiovascular: regular rate and rhythm, S1, S2 normal, no murmur, click, rub or gallop      Visit Diagnosis  Bronchitis    Nasal congestion    Plan  Orders Placed This Encounter   . promethazine-dextromethorphan (PHENERGAN-DM) 6.25-15 mg/5 mL Oral Syrup   . fluticasone propionate (FLONASE) 50 mcg/actuation Nasal Spray, Suspension   . doxycycline monohydrate (MONODOX) 100 mg Oral Capsule     Plan was discussed with patient (or responsible party, e.g. parent or guardian). If symptoms are worsening or  not improving the patient should return to Urgent Care for further evaluation, contact their PCP, or go to the Emergency Department for further evaluation.    Ambrose Mantle, MD  04/19/2022, 12:08  Electronically signed by Ambrose Mantle, MD

## 2022-05-12 ENCOUNTER — Other Ambulatory Visit (INDEPENDENT_AMBULATORY_CARE_PROVIDER_SITE_OTHER): Payer: Self-pay | Admitting: Family Medicine

## 2022-05-12 DIAGNOSIS — E119 Type 2 diabetes mellitus without complications: Secondary | ICD-10-CM

## 2022-05-12 DIAGNOSIS — K5900 Constipation, unspecified: Secondary | ICD-10-CM

## 2022-05-12 NOTE — Telephone Encounter (Signed)
Last scheduled appointment with you was 03/19/2022.  Currently scheduled future appointment is 06/27/2022.      Rochele Pages, LPN  01/12/3663, 40:34

## 2022-05-21 ENCOUNTER — Ambulatory Visit (INDEPENDENT_AMBULATORY_CARE_PROVIDER_SITE_OTHER): Payer: Commercial Managed Care - PPO | Admitting: Registered Nurse

## 2022-05-21 ENCOUNTER — Other Ambulatory Visit: Payer: Self-pay

## 2022-05-21 ENCOUNTER — Encounter (INDEPENDENT_AMBULATORY_CARE_PROVIDER_SITE_OTHER): Payer: Self-pay

## 2022-05-21 VITALS — BP 100/60 | HR 99 | Temp 97.5°F | Resp 18 | Ht 66.0 in | Wt 199.0 lb

## 2022-05-21 DIAGNOSIS — R197 Diarrhea, unspecified: Secondary | ICD-10-CM

## 2022-05-21 DIAGNOSIS — R112 Nausea with vomiting, unspecified: Secondary | ICD-10-CM

## 2022-05-21 NOTE — Progress Notes (Signed)
[address] [name]  [place]  [place] [address]  [phone]    Progress Note    Date: 05/21/2022  Name: Monique [name]   MRN: [id number]   DOB: 1966/10/02   PCP: Cherly Beach, MD    Reason for Visit: Vomiting (Onset since 4pm yesterday with vomiting and diarrhea. ( Pt has taken care of her mother in law 2 days ago.) weak , fatigue)    History of Present Illness  Monique [name] is a 56 y.o. female who is being seen today for above stated symptoms.  She has only had one episode of diarrhea today which was just loose and no vomiting since last night.  She has taken Zofran and antidiarrheal medication.  She has been able to drink water and orange juice.      Nursing Notes:  There are no exam notes on file for this visit.    Current Outpatient Medications   Medication Sig   . aspirin (ECOTRIN) 81 mg Oral Tablet, Delayed Release (E.C.) Take 1 Tablet (81 mg total) by mouth Once a day   . Blood Sugar Diagnostic (ACCU-CHEK GUIDE TEST STRIPS) Strip TEST BLOOD SUGAR THREE TIMES DAILY   . Blood-Glucose Meter (ACCU-CHEK AVIVA PLUS METER) Misc 1 Kit Three times a day   . clopidogreL (PLAVIX) 75 mg Oral Tablet TAKE 1 TABLET BY MOUTH ONCE DAILY   . cyanocobalamin (VITAMIN B12) 1,000 mcg/mL Injection Solution Inject 1 mL (1,000 mcg total) under the skin Every 30 days   . diclofenac sodium (VOLTAREN) 1 % Gel Apply 2 g topically Three times a day as needed   . docusate sodium (COLACE) 100 mg Oral Capsule TAKE 1 CAPSULE TWICE DAILY   . empagliflozin (JARDIANCE) 10 mg Oral Tablet Take 1 Tablet (10 mg total) by mouth Once a day   . ergocalciferol, vitamin D2, (DRISDOL) 1,250 mcg (50,000 unit) Oral Capsule TAKE 1 CAPSULE BY MOUTH EVERY WEEK   . fluticasone propionate (FLONASE) 50 mcg/actuation Nasal Spray, Suspension Administer 2 Sprays into each nostril Once per day as needed (congestion of ears/nose)   . furosemide (LASIX) 40 mg Oral Tablet TAKE 1 TABLET(40 MG) BY MOUTH EVERY DAY   .  HYDROcodone-acetaminophen (NORCO) 10-325 mg Oral Tablet Take 1 Tablet by mouth Every 8 hours as needed for Pain [name] with [facility]   . Lancets (ACCU-CHEK SOFTCLIX LANCETS) Misc To use daily   . levothyroxine (SYNTHROID) 25 mcg Oral Tablet Take 1 Tablet (25 mcg total) by mouth Once a day   . metoprolol succinate (TOPROL-XL) 25 mg Oral Tablet Sustained Release 24 hr Take 0.5 Tablets (12.5 mg total) by mouth Once a day   . NARCAN 4 mg/actuation Nasal Spray, Non-Aerosol PT STATED SHE HAS NARCAN AT HOME BUT IS NOT TAKING IT   . Needle, Disp, 25 G 25 gauge x 1" Needle 1 mL Every 30 days   . niacin (NIASPAN) 500 mg Oral Tablet Sustained Release Take 1 Tablet (500 mg total) by mouth Once a day   . nitroGLYCERIN (NITROSTAT) 0.4 mg Sublingual Tablet, Sublingual Place 1 Tablet (0.4 mg total) under the tongue Every 5 minutes as needed for Chest pain for 3 doses over 15 minutes   . omeprazole (PRILOSEC) 40 mg Oral Capsule, Delayed Release(E.C.) TAKE 1 CAPSULE BY MOUTH EVERY DAY   . ondansetron (ZOFRAN ODT) 8 mg Oral Tablet, Rapid Dissolve DISSOLVE 1 TABLET ON THE TONGUE EVERY 8 HOURS AS NEEDED FOR NAUSEA OR  VOMITING   . ondansetron (ZOFRAN) 4 mg Oral Tablet Take 1 Tablet (4 mg total) by mouth Every 8 hours as needed for Nausea/Vomiting   . PRENATAL PLUS, CALCIUM CARB, 27 mg iron- 1 mg Oral Tablet Take 1 Tablet by mouth Once a day   . promethazine-dextromethorphan (PHENERGAN-DM) 6.25-15 mg/5 mL Oral Syrup Take 5 mL by mouth Four times a day as needed for Cough   . rosuvastatin (CRESTOR) 40 mg Oral Tablet Take 1 Tablet (40 mg total) by mouth Once a day TAKE 1 TABLET BY MOUTH ONCE DAILY   . semaglutide (OZEMPIC) 0.25 mg or 0.5 mg(2 mg/1.5 mL) Subcutaneous Pen Injector Inject 0.5 mg under the skin Every 7 days   . sennosides-docusate sodium (SENOKOT-S) 8.6-50 mg Oral Tablet Take 1 Tablet by mouth Every evening   . venlafaxine (EFFEXOR XR) 37.5 mg Oral Capsule, Sust. Release 24 hr Take 1 Capsule (37.5 mg  total) by mouth Once a day   . ZYRTEC-D 5-120 mg Oral Tablet Sustained Release 12 hr Take 1 Tablet by mouth Once a day     Allergies   Allergen Reactions   . Ane Payment [Pyrilamine-Dextromethorphan]  Other Adverse Reaction (Add comment)     LIGHT HEADED   . Other      Coban dressing caused skin break down   . Tylenol W Codeine [Acetaminophen-Codeine]  Other Adverse Reaction (Add comment)     pt states that one time she had chest pains with this med, but dr. thought it was because she had not eaten     Past Medical History:   Diagnosis Date   . Anxiety    . Arthritis    . Back problem     DDD   . Beta blocker prescribed for left ventricular systolic dysfunction    . Blood thinned due to long-term anticoagulant use    . Carotid stenosis     occlusion of left ICA   . Chronic back pain    . Coronary artery disease involving native coronary artery of native heart    . DDD (degenerative disc disease), lumbar     "entire spine"   . Ear piercing    . GERD (gastroesophageal reflux disease)     controlled with omeprazole   . H/O complete eye exam     2 years, Dr. [name], at [facility]   . History of dental examination     dentures upper and lower   . HTN    . Hx of coronary artery bypass graft     2012   . Hx of gastric bypass    . Hypercholesterolemia    . Hyperlipidemia    . Hypothyroid    . MI (myocardial infarction) (CMS [place]) 2012   . Neck problem     herniated cervical disc   . Obesity (BMI 30-39.9)    . 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 [place] ordered.    . Stented coronary artery     x4   . Stroke (CMS [facility])     2002   . Tattoo     Left breast, Right shoulder   . Thyroid disorder    . Thyroid nodule    . Type 2 diabetes mellitus (CMS HCC)     HGA1C 10.4 on 12/19/21   . Uncontrolled type 2 diabetes mellitus, without long-term current use of insulin    . Xanthelasma  Past Surgical History:   Procedure Laterality Date   . ENDOMETRIAL ABLATION  1998   . HX ANKLE  FRACTURE TX  1990s    Right, Doran Durand procedure   . HX CATARACT REMOVAL Bilateral 2013    r and l eyes with implants   . HX CESAREAN SECTION  06/20/2000    x3, 11/21/86, 11/10/84   . HX CHOLECYSTECTOMY  1988   . HX CORONARY ARTERY BYPASS GRAFT  03/31/2011    cardiac, 2 vessel, Dr. [name], [facility]   . HX CORONARY STENT PLACEMENT  2012    x4 stents   . HX GASTRIC SLEEVE  02/2017    [place], [place], Dr. Pearlie Oyster   . HX HEART CATHETERIZATION  2012    massive heart attack and stents - [facility]   . HX THYROID BIOPSY     . HX THYROIDECTOMY  101/01/2018   . HX TONSILLECTOMY      as child   . HX YAG Left 09/20/2013         Family Medical History:     Problem Relation (Age of Onset)    Atrial fibrillation Sister    Colon Cancer Father    Diabetes Mother, Maternal Aunt    Heart Disease Sister, Maternal Grandfather    Hypertension (High Blood Pressure) Mother    Lung Cancer Maternal Grandfather    MI <66 years of age Mother          Social History     Tobacco Use   . Smoking status: Every Day     Packs/day: 0.50     Years: 20.00     Pack years: 10.00     Types: Cigarettes     Start date: 36   . Smokeless tobacco: Never   Vaping Use   . Vaping Use: Never used   Substance Use Topics   . Alcohol use: No   . Drug use: No       Review of Systems  Negative unless otherwise mentioned in HPI    Physical Exam:  Vitals:    05/21/22 1344   BP: 100/60   Pulse: 99   Resp: 18   Temp: 36.4 C (97.5 F)   SpO2: 98%   Weight: 90.3 kg (199 lb)   Height: 1.676 m (5' 6" )   BMI: 32.19      Physical Exam  Vitals and nursing note reviewed. Exam conducted with a chaperone present.   Constitutional:       Appearance: Normal appearance.   HENT:      Right Ear: Tympanic membrane normal.      Left Ear: Tympanic membrane normal.      Mouth/Throat:      Mouth: Mucous membranes are moist.   Eyes:      Pupils: Pupils are equal, round, and reactive to light.   Cardiovascular:      Rate and Rhythm: Normal rate and regular rhythm.   Pulmonary:      Effort:  Pulmonary effort is normal.      Breath sounds: Normal breath sounds and air entry.   Abdominal:      General: Bowel sounds are increased.      Tenderness: There is generalized abdominal tenderness.   Musculoskeletal:      Cervical back: Neck supple.   Skin:     General: Skin is warm and dry.   Neurological:      Mental Status: She is alert and oriented to person, place, and time.  Psychiatric:         Mood and Affect: Mood normal.         Behavior: Behavior normal.         Assessment:  Problem List Items Addressed This Visit    None  Visit Diagnoses     Nausea & vomiting    -  Primary    Diarrhea              Plan/Patient Instructions  Patient Instructions   Drink plenty of fluids  Continue your Zofran (has at home)  Start a bland diet (see print out) and continue for 24 hours then advance as tolerated.  Monitor your blood sugars.  If symptoms persist or worsen follow up.        Follow up with PCP.  Seek medical attention for new or worsening symptoms.    I saw the patient independently.        Benjaman Kindler, NP

## 2022-05-21 NOTE — Patient Instructions (Signed)
Drink plenty of fluids  Continue your Zofran (has at home)  Start a bland diet (see print out) and continue for 24 hours then advance as tolerated.  Monitor your blood sugars.  If symptoms persist or worsen follow up.

## 2022-05-30 ENCOUNTER — Encounter (HOSPITAL_BASED_OUTPATIENT_CLINIC_OR_DEPARTMENT_OTHER): Payer: Self-pay | Admitting: Internal Medicine

## 2022-05-30 ENCOUNTER — Other Ambulatory Visit: Payer: Self-pay

## 2022-05-30 ENCOUNTER — Ambulatory Visit (INDEPENDENT_AMBULATORY_CARE_PROVIDER_SITE_OTHER): Payer: Commercial Managed Care - PPO | Admitting: Internal Medicine

## 2022-05-30 VITALS — BP 106/62 | HR 62 | Ht 66.0 in | Wt 199.0 lb

## 2022-05-30 DIAGNOSIS — E7800 Pure hypercholesterolemia, unspecified: Secondary | ICD-10-CM

## 2022-05-30 DIAGNOSIS — E785 Hyperlipidemia, unspecified: Secondary | ICD-10-CM

## 2022-05-30 DIAGNOSIS — Z951 Presence of aortocoronary bypass graft: Secondary | ICD-10-CM

## 2022-05-30 NOTE — Progress Notes (Signed)
Cardiology Clinic Follow Up Note    Name:  Monique [name]   DOB: 04/01/1966   MRN: [id number]   Date:  05/30/2022     PCP: Cherly Beach, MD     History  Monique [name] is a 56 y.o. female.   who is here for a follow-up visit.  Patient has coronary artery disease with bypass surgery in 2012 with LIMA to LAD and SVG to OM.  Approximately 4 months after surgery the patient had acute myocardial infarction and required percutaneous intervention by Dr. Laurin Coder.  The patient underwent PCI y of the anastomosis of the IMA graft to the LAD.  The patient had a 2.75 X 12 Promus stent placed in the proximal IMA graft and then a 2.75 X 8 Promus stent in the distal portion of the graft.  A 2.75 X 8 Promus stent was placed at the ostium of the LAD with plaque shifting to the circumflex.  A 3.5 X 8 Promus stent was then deployed in the left main extending into the left circumflex with final kissing balloon angioplasty of the LAD and circumflex.    Last testing:    Stress test 11-2019:  Normal perfusion.  EF 50%  Echocardiogram 11/09/2019: EF 60-65%, trivial aortic regurgitation    Last testing:   Latest Reference Range & Units Most Recent   CHOLESTEROL 0 - 199 mg/dL 182  05/08/21 12:25   HDL-CHOLESTEROL 40 - 60 mg/dL 55  05/08/21 12:25   LDL (CALCULATED) 0 - 130 mg/dL 96  05/08/21 12:25   TRIGLYCERIDES 0 - 149 mg/dL 153 (H)  05/08/21 12:25   (H): Data is abnormally high    Past Medical History:   Diagnosis Date   . Anxiety    . Arthritis    . Back problem     DDD   . Beta blocker prescribed for left ventricular systolic dysfunction    . Blood thinned due to long-term anticoagulant use    . Carotid stenosis     occlusion of left ICA   . Chronic back pain    . Coronary artery disease involving native coronary artery of native heart    . DDD (degenerative disc disease), lumbar     "entire spine"   . Ear piercing    . GERD (gastroesophageal reflux disease)     controlled with omeprazole   . H/O complete eye exam     2 years, Dr. [name],  at [facility]   . History of dental examination     dentures upper and lower   . HTN    . Hx of coronary artery bypass graft     2012   . Hx of gastric bypass    . Hypercholesterolemia    . Hyperlipidemia    . Hypothyroid    . MI (myocardial infarction) (CMS [place]) 2012   . Neck problem     herniated cervical disc   . Obesity (BMI 30-39.9)    . 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 [place] ordered.    . Stented coronary artery     x4   . Stroke (CMS [facility])     2002   . Tattoo     Left breast, Right shoulder   . Thyroid disorder    . Thyroid nodule    . Type 2 diabetes mellitus (CMS HCC)     HGA1C 10.4 on 12/19/21   . Uncontrolled type 2  diabetes mellitus, without long-term current use of insulin    . Xanthelasma            Patient Active Problem List    Diagnosis Date Noted   . Left leg weakness 05/15/2021   . Dizziness 05/15/2021   . Chronic lumbar radiculopathy 05/15/2021   . Hypertension associated with type 2 diabetes mellitus (CMS [place]) 05/15/2021   . Type 2 diabetes mellitus with complication, without long-term current use of insulin (CMS [place]) 05/15/2021   . Major depressive disorder, recurrent, unspecified (CMS [place]) 01/03/2021   . Peripheral vascular disease, unspecified (CMS [place]) 01/03/2021   . Fracture of phalanx of finger of right hand 12/05/2019   . Spinal pain 12/05/2019   . Type 2 diabetes mellitus with hyperglycemia (CMS HCC) 12/05/2019   . Upper respiratory infection 12/05/2019   . Thyroid disorder 09/16/2019   . UDS: 07/28/19 08/02/2019   . Sinus congestion 12/07/2018   . Morbid obesity (CMS [place]) 09/13/2018   . Type 2 diabetes mellitus with hyperosmolarity without coma, without long-term current use of insulin (CMS HCC)    . Coronary artery disease involving native coronary artery of native heart    . GERD (gastroesophageal reflux disease)    . HTN (hypertension)    . Hyperlipidemia    . Constipation 04/15/2018   . Thyroid nodule 04/15/2018   . Chest pain  04/03/2018   . DDD (degenerative disc disease), lumbar      "entire spine"     . Anxiety    . Chronic back pain    . Carotid stenosis    . BMI 32.0-32.9,adult    . H/O complete eye exam    . 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 Hypercholesterolemia 04/15/2002        Past Surgical History:   Procedure Laterality Date   . ENDOMETRIAL ABLATION  1998   . HX ANKLE FRACTURE TX  1990s    Right, Doran Durand procedure   . HX CATARACT REMOVAL Bilateral 2013    r and l eyes with implants   . HX CESAREAN SECTION  06/20/2000    x3, 11/21/86, 11/10/84   . HX CHOLECYSTECTOMY  1988   . HX CORONARY ARTERY BYPASS GRAFT  03/31/2011    cardiac, 2 vessel, Dr. [name], [facility]   . HX CORONARY STENT PLACEMENT  2012    x4 stents   . HX GASTRIC SLEEVE  02/2017    [place], [place], Dr. Pearlie Oyster   . HX HEART CATHETERIZATION  2012    [facility] stents - [facility]   . HX THYROID BIOPSY     . HX THYROIDECTOMY  101/01/2018   . HX TONSILLECTOMY      as child   . HX YAG Left 09/20/2013            Current Outpatient Medications   Medication Sig   . aspirin (ECOTRIN) 81 mg Oral Tablet, Delayed Release (E.C.) Take 1 Tablet (81 mg total) by mouth Once a day   . Blood Sugar Diagnostic (ACCU-CHEK GUIDE TEST STRIPS) Strip TEST BLOOD SUGAR THREE TIMES DAILY   . Blood-Glucose Meter (ACCU-CHEK AVIVA PLUS METER) Misc 1 Kit Three times a day   . clopidogreL (PLAVIX) 75 mg Oral Tablet TAKE 1 TABLET BY MOUTH ONCE DAILY   . cyanocobalamin (VITAMIN B12) 1,000 mcg/mL Injection Solution Inject 1 mL (1,000 mcg total) under the skin Every 30 days   . diclofenac sodium (  VOLTAREN) 1 % Gel Apply 2 g topically Three times a day as needed (Patient not taking: Reported on 05/30/2022)   . docusate sodium (COLACE) 100 mg Oral Capsule TAKE 1 CAPSULE TWICE DAILY   . empagliflozin (JARDIANCE) 10 mg Oral Tablet Take 1 Tablet (10 mg total) by mouth Once a day   . ergocalciferol, vitamin D2, (DRISDOL) 1,250 mcg (50,000 unit) Oral Capsule TAKE 1 CAPSULE BY  MOUTH EVERY WEEK   . fluticasone propionate (FLONASE) 50 mcg/actuation Nasal Spray, Suspension Administer 2 Sprays into each nostril Once per day as needed (congestion of ears/nose)   . furosemide (LASIX) 40 mg Oral Tablet TAKE 1 TABLET(40 MG) BY MOUTH EVERY DAY   . HYDROcodone-acetaminophen (NORCO) 10-325 mg Oral Tablet Take 1 Tablet by mouth Every 8 hours as needed for Pain [name] with [facility]   . Lancets (ACCU-CHEK SOFTCLIX LANCETS) Misc To use daily   . levothyroxine (SYNTHROID) 25 mcg Oral Tablet Take 1 Tablet (25 mcg total) by mouth Once a day   . metoprolol succinate (TOPROL-XL) 25 mg Oral Tablet Sustained Release 24 hr Take 0.5 Tablets (12.5 mg total) by mouth Once a day   . NARCAN 4 mg/actuation Nasal Spray, Non-Aerosol PT STATED SHE HAS NARCAN AT HOME BUT IS NOT TAKING IT   . Needle, Disp, 25 G 25 gauge x 1" Needle 1 mL Every 30 days   . niacin (NIASPAN) 500 mg Oral Tablet Sustained Release Take 1 Tablet (500 mg total) by mouth Once a day   . nitroGLYCERIN (NITROSTAT) 0.4 mg Sublingual Tablet, Sublingual Place 1 Tablet (0.4 mg total) under the tongue Every 5 minutes as needed for Chest pain for 3 doses over 15 minutes   . omeprazole (PRILOSEC) 40 mg Oral Capsule, Delayed Release(E.C.) TAKE 1 CAPSULE BY MOUTH EVERY DAY   . ondansetron (ZOFRAN ODT) 8 mg Oral Tablet, Rapid Dissolve DISSOLVE 1 TABLET ON THE TONGUE EVERY 8 HOURS AS NEEDED FOR NAUSEA OR VOMITING   . ondansetron (ZOFRAN) 4 mg Oral Tablet Take 1 Tablet (4 mg total) by mouth Every 8 hours as needed for Nausea/Vomiting (Patient not taking: Reported on 05/30/2022)   . PRENATAL PLUS, CALCIUM CARB, 27 mg iron- 1 mg Oral Tablet Take 1 Tablet by mouth Once a day   . promethazine-dextromethorphan (PHENERGAN-DM) 6.25-15 mg/5 mL Oral Syrup Take 5 mL by mouth Four times a day as needed for Cough   . rosuvastatin (CRESTOR) 40 mg Oral Tablet Take 1 Tablet (40 mg total) by mouth Once a day TAKE 1 TABLET BY MOUTH ONCE DAILY   .  semaglutide (OZEMPIC) 0.25 mg or 0.5 mg(2 mg/1.5 mL) Subcutaneous Pen Injector Inject 0.5 mg under the skin Every 7 days   . sennosides-docusate sodium (SENOKOT-S) 8.6-50 mg Oral Tablet Take 1 Tablet by mouth Every evening   . venlafaxine (EFFEXOR XR) 37.5 mg Oral Capsule, Sust. Release 24 hr Take 1 Capsule (37.5 mg total) by mouth Once a day   . ZYRTEC-D 5-120 mg Oral Tablet Sustained Release 12 hr Take 1 Tablet by mouth Once a day        Allergies   Allergen Reactions   . Ane Payment [Pyrilamine-Dextromethorphan]  Other Adverse Reaction (Add comment)     LIGHT HEADED   . Other      Coban dressing caused skin break down   . Tylenol W Codeine [Acetaminophen-Codeine]  Other Adverse Reaction (Add comment)     pt states that one time she had chest pains  with this med, but dr. thought it was because she had not eaten       Family Medical History:     Problem Relation (Age of Onset)    Atrial fibrillation Sister    Colon Cancer Father    Diabetes Mother, Maternal Aunt    Heart Disease Sister, Maternal Grandfather    Hypertension (High Blood Pressure) Mother    Lung Cancer Maternal Grandfather    MI <26 years of age Mother            Social History     Tobacco Use   . Smoking status: Every Day     Packs/day: 0.50     Years: 20.00     Pack years: 10.00     Types: Cigarettes     Start date: 67   . Smokeless tobacco: Never   Substance Use Topics   . Alcohol use: No       REVIEW OF SYSTEMS:  10 system review is negative other than that stated in the HPI  Physical Exam  BP 106/62 Comment: RTA  Pulse 62   Ht 1.676 m (_0 )   Wt 90.3 kg (199 lb)   LMP  (LMP Unknown)   SpO2 97%   BMI 32.12 kg/m       GEN:  Pleasant, conversant and in no acute distress  HEENT: Pupils equal and reactive.  Sclera nonicteric  Neck: No JVD, thyromegaly or bruit  Lungs: Clear bilaterally  CV:  Regular rhythm.  Normal S1-S2.  ABD: Soft and nontender with no appreciable organomegaly  E XT: No cyanosis or edema  NEURO: CN 2-12 intact with no focal  defects`  SKIN: No suspicious skin lesions         Assesment and Plan   No diagnosis found.      Paulita Cradle, MD  05/30/2022, 15:17

## 2022-06-11 ENCOUNTER — Other Ambulatory Visit (INDEPENDENT_AMBULATORY_CARE_PROVIDER_SITE_OTHER): Payer: Self-pay | Admitting: Family Medicine

## 2022-06-11 DIAGNOSIS — I639 Cerebral infarction, unspecified: Secondary | ICD-10-CM

## 2022-06-11 DIAGNOSIS — I219 Acute myocardial infarction, unspecified: Secondary | ICD-10-CM

## 2022-06-11 DIAGNOSIS — E118 Type 2 diabetes mellitus with unspecified complications: Secondary | ICD-10-CM

## 2022-06-11 DIAGNOSIS — I739 Peripheral vascular disease, unspecified: Secondary | ICD-10-CM

## 2022-06-11 MED ORDER — ROSUVASTATIN 40 MG TABLET
40.0000 mg | ORAL_TABLET | Freq: Every day | ORAL | 2 refills | Status: DC
Start: 2022-06-11 — End: 2024-01-05

## 2022-06-11 NOTE — Telephone Encounter (Signed)
Last scheduled appointment with you was 03/19/2022.  Currently scheduled future appointment is 06/27/2022.      Rochele Pages, LPN  01/11/8249, 03:70

## 2022-06-27 ENCOUNTER — Encounter (INDEPENDENT_AMBULATORY_CARE_PROVIDER_SITE_OTHER): Payer: Self-pay | Admitting: Family Medicine

## 2022-07-15 ENCOUNTER — Other Ambulatory Visit: Payer: Self-pay

## 2022-07-16 ENCOUNTER — Other Ambulatory Visit: Payer: Self-pay

## 2022-07-16 ENCOUNTER — Encounter (INDEPENDENT_AMBULATORY_CARE_PROVIDER_SITE_OTHER): Payer: Commercial Managed Care - PPO | Admitting: Family Medicine

## 2022-07-17 ENCOUNTER — Encounter (INDEPENDENT_AMBULATORY_CARE_PROVIDER_SITE_OTHER): Payer: Self-pay | Admitting: Family Medicine

## 2022-07-17 ENCOUNTER — Ambulatory Visit (INDEPENDENT_AMBULATORY_CARE_PROVIDER_SITE_OTHER): Payer: Commercial Managed Care - PPO | Admitting: Family Medicine

## 2022-07-17 VITALS — BP 110/62 | HR 69 | Temp 97.4°F | Resp 16 | Ht 66.0 in | Wt 206.2 lb

## 2022-07-17 DIAGNOSIS — E1165 Type 2 diabetes mellitus with hyperglycemia: Secondary | ICD-10-CM

## 2022-07-17 DIAGNOSIS — M4696 Unspecified inflammatory spondylopathy, lumbar region: Secondary | ICD-10-CM

## 2022-07-17 DIAGNOSIS — B351 Tinea unguium: Secondary | ICD-10-CM

## 2022-07-17 DIAGNOSIS — E278 Other specified disorders of adrenal gland: Secondary | ICD-10-CM

## 2022-07-17 DIAGNOSIS — Z1211 Encounter for screening for malignant neoplasm of colon: Secondary | ICD-10-CM

## 2022-07-17 DIAGNOSIS — Z0000 Encounter for general adult medical examination without abnormal findings: Secondary | ICD-10-CM

## 2022-07-17 DIAGNOSIS — Z1159 Encounter for screening for other viral diseases: Secondary | ICD-10-CM

## 2022-07-17 DIAGNOSIS — Z114 Encounter for screening for human immunodeficiency virus [HIV]: Secondary | ICD-10-CM

## 2022-07-17 NOTE — Progress Notes (Signed)
FAMILY MEDICINE, [facility] PRIMARY CARE  [place] [id number]    Medicare Annual Wellness Visit    Name: Monique [name] MRN:  [id number]   Date: 07/17/2022 Age: 56 y.o.       SUBJECTIVE:   Monique [name] is a 56 y.o. female for presenting for Medicare Wellness exam. I have reviewed and reconciled the medication list with the patient today.    Comprehensive Health Assessment:    Paper document Ranger reviewed and scanned into medical record    I have reviewed and updated as appropriate the past medical, family and social history. 07/17/2022 as summarized below:  Past Medical History:   Diagnosis Date    Anxiety     Arthritis     Back problem     DDD    Beta blocker prescribed for left ventricular systolic dysfunction     Blood thinned due to long-term anticoagulant use     Carotid stenosis     occlusion of left ICA    Chronic back pain     Coronary artery disease involving native coronary artery of native heart     DDD (degenerative disc disease), lumbar     "entire spine"    Ear piercing     GERD (gastroesophageal reflux disease)     controlled with omeprazole    H/O complete eye exam     2 years, Dr. [name], at [facility]    History of dental examination     dentures upper and lower    HTN     Hx of coronary artery bypass graft     2012    Hx of gastric bypass     Hypercholesterolemia     Hyperlipidemia     Hypothyroid     MI (myocardial infarction) (CMS [place]) 2012    Neck problem     herniated cervical disc    Obesity (BMI 30-39.9)     Solitary nodule of right lobe of thyroid 03/30/2018    Incidental finding on MRI thoracic spine. [place] ordered.     Stented coronary artery     x4    Stroke (CMS [facility])     2002    Tattoo     Left breast, Right shoulder    Thyroid disorder     Thyroid nodule     Type 2 diabetes mellitus (CMS HCC)     HGA1C 10.4 on 12/19/21    Uncontrolled type 2 diabetes mellitus, without long-term current use of insulin     Xanthelasma      Past  Surgical History:   Procedure Laterality Date    Endometrial ablation  1998    Hx ankle fracture tx  1990s    Hx cataract removal Bilateral 2013    Hx cesarean section  06/20/2000    Hx cholecystectomy  1988    Hx coronary artery bypass graft  03/31/2011    Hx coronary stent placement  2012    Hx gastric sleeve  02/2017    Hx heart catheterization  2012    Hx thyroid biopsy      Hx thyroidectomy  101/01/2018    Hx tonsillectomy      Hx yag Left 09/20/2013     Current Outpatient Medications   Medication Sig    aspirin (ECOTRIN) 81 mg Oral Tablet, Delayed Release (E.C.) Take 1 Tablet (81 mg total) by mouth Once a day    Blood Sugar Diagnostic (ACCU-CHEK GUIDE  TEST STRIPS) Strip TEST BLOOD SUGAR THREE TIMES DAILY    Blood-Glucose Meter (ACCU-CHEK AVIVA PLUS METER) Misc 1 Kit Three times a day    clopidogreL (PLAVIX) 75 mg Oral Tablet TAKE 1 TABLET BY MOUTH ONCE DAILY    cyanocobalamin (VITAMIN B12) 1,000 mcg/mL Injection Solution Inject 1 mL (1,000 mcg total) under the skin Every 30 days    diclofenac sodium (VOLTAREN) 1 % Gel Apply 2 g topically Three times a day as needed (Patient not taking: Reported on 05/30/2022)    docusate sodium (COLACE) 100 mg Oral Capsule TAKE 1 CAPSULE TWICE DAILY    empagliflozin (JARDIANCE) 10 mg Oral Tablet Take 1 Tablet (10 mg total) by mouth Once a day    ergocalciferol, vitamin D2, (DRISDOL) 1,250 mcg (50,000 unit) Oral Capsule TAKE 1 CAPSULE BY MOUTH EVERY WEEK    fluticasone propionate (FLONASE) 50 mcg/actuation Nasal Spray, Suspension Administer 2 Sprays into each nostril Once per day as needed (congestion of ears/nose)    furosemide (LASIX) 40 mg Oral Tablet TAKE 1 TABLET(40 MG) BY MOUTH EVERY DAY    HYDROcodone-acetaminophen (NORCO) 10-325 mg Oral Tablet Take 1 Tablet by mouth Every 8 hours as needed for Pain [name] with [facility]    Lancets (ACCU-CHEK SOFTCLIX LANCETS) Misc To use daily    levothyroxine (SYNTHROID) 25 mcg Oral Tablet Take 1 Tablet (25 mcg  total) by mouth Once a day    metoprolol succinate (TOPROL-XL) 25 mg Oral Tablet Sustained Release 24 hr Take 0.5 Tablets (12.5 mg total) by mouth Once a day    NARCAN 4 mg/actuation Nasal Spray, Non-Aerosol PT STATED SHE HAS NARCAN AT HOME BUT IS NOT TAKING IT    Needle, Disp, 25 G 25 gauge x 1" Needle 1 mL Every 30 days    niacin (NIASPAN) 500 mg Oral Tablet Sustained Release Take 1 Tablet (500 mg total) by mouth Once a day    nitroGLYCERIN (NITROSTAT) 0.4 mg Sublingual Tablet, Sublingual Place 1 Tablet (0.4 mg total) under the tongue Every 5 minutes as needed for Chest pain for 3 doses over 15 minutes    omeprazole (PRILOSEC) 40 mg Oral Capsule, Delayed Release(E.C.) TAKE 1 CAPSULE BY MOUTH EVERY DAY    ondansetron (ZOFRAN ODT) 8 mg Oral Tablet, Rapid Dissolve DISSOLVE 1 TABLET ON THE TONGUE EVERY 8 HOURS AS NEEDED FOR NAUSEA OR VOMITING    PRENATAL PLUS, CALCIUM CARB, 27 mg iron- 1 mg Oral Tablet Take 1 Tablet by mouth Once a day    rosuvastatin (CRESTOR) 40 mg Oral Tablet Take 1 Tablet (40 mg total) by mouth Once a day TAKE 1 TABLET BY MOUTH ONCE DAILY    semaglutide (OZEMPIC) 0.25 mg or 0.5 mg(2 mg/1.5 mL) Subcutaneous Pen Injector Inject 0.5 mg under the skin Every 7 days    sennosides-docusate sodium (SENOKOT-S) 8.6-50 mg Oral Tablet Take 1 Tablet by mouth Every evening    venlafaxine (EFFEXOR XR) 37.5 mg Oral Capsule, Sust. Release 24 hr Take 1 Capsule (37.5 mg total) by mouth Once a day    ZYRTEC-D 5-120 mg Oral Tablet Sustained Release 12 hr Take 1 Tablet by mouth Once a day     Family Medical History:       Problem Relation (Age of Onset)    Atrial fibrillation Sister    Colon Cancer Father    Diabetes Mother, Maternal Aunt    Heart Disease Sister, Maternal Grandfather    Hypertension (High Blood Pressure) Mother    Lung  Cancer Maternal Grandfather    MI <56 years of age Mother            Social History     Socioeconomic History    Marital status: Married     Spouse name: [name]    Number of children: 3     Years of education: completed 11th grade   Occupational History    Occupation: disabled   Tobacco Use    Smoking status: Every Day     Packs/day: 0.50     Years: 20.00     Pack years: 10.00     Types: Cigarettes     Start date: 1981    Smokeless tobacco: Never   Vaping Use    Vaping Use: Never used   Substance and Sexual Activity    Alcohol use: No    Drug use: No    Sexual activity: Yes     Partners: Male   Social History Narrative    Living situation: lives at home with husband, [name], and daughter.  1 dog named Duke and 1 cat named Lucky.    Nutrition:  Eats 5 small meals d/t gastric sleeve surgery.     Caffeine use: none, decaf coffee    Exercise: stays busy around house. No formal form of exercise    Seatbelt use: sometimes    Fire extinguishers in the home: yes    Smoke alarms in the home: yes    Carbon monoxide detectors in the home: yes    Nancy Fetter exposure: sunglasses        Magaret would like for husband, Dae Antonucci,  to speak for her in the event she would become incapacitated.    Full code.          List of Current Health Care Providers   Care Team       PCP       Name Type Specialty Phone Number    Cherly Beach, MD Physician FAMILY MEDICINE [phone]              Care Team       Name Type Specialty Phone Number    Parthenia Ames, MD Not available GASTROENTEROLOGY [phone]    Rosaria Ferries., MD Not available PAIN MANAGEMENT [phone]    Paulita Cradle, MD Physician [place]  [phone]                      Health Maintenance   Topic Date Due    Diabetic Retinal Exam  Never done    Hepatitis B Vaccine (1 of 3 - 3-dose series) Never done    Hepatitis C screening  Never done    HIV Screening  Never done    Adult Tdap-Td (1 - Tdap) Never done    Colonoscopy  Never done    Pap smear  10/18/2013    Shingles Vaccine (1 of 2) Never done    Covid-19 Vaccine (4 - Moderna series) 12/10/2020    Diabetic A1C  03/19/2022    Influenza Vaccine (1) 08/08/2022    Diabetic  Kidney Health eGFR  12/17/2022    Diabetic Kidney Health Microalb/Cr Ratio  12/19/2022    Mammography  02/07/2023    Annual Wellness Visit  07/18/2023    Pneumococcal Vaccine, Age 23-64  Completed    Meningococcal Vaccine  Aged Out    Depression Screening  Discontinued     Medicare Wellness Assessment   Medicare initial or wellness physical  in the last year?: No  Advance Directives   Does patient have a living will or MPOA: no   Has patient provided [employer] with a copy?: no   Advance directive information given to the patient today?: no      Activities of Daily Living   Do you need help with dressing, bathing, or walking?: No   Do you need help with shopping, housekeeping, medications, or finances?: No   Do you have rugs in hallways, broken steps, or poor lighting?: No   Do you have grab bars in your bathroom, non-slip strips in your tub, and hand rails on your stairs?: No   Urinary Incontinence Screen       Cognitive Function Screen (1=Yes, 0=No)   What is you age?: Correct   What is the time to the nearest hour?: Correct   What is the year?: Correct   What is the name of this clinic?: Correct   Can the patient recognize two persons (the doctor, the nurse, home help, etc.)?: Correct   What is the date of your birth? (day and month sufficient) : Correct   In what year did World War II end?: Correct   Who is the current president of the [place]?: Correct   Count from 20 down to 1?: Correct   What address did I give you earlier?: Correct   Total Score: 10   Interpretation of Total Score: Greater than 6 Normal   Hearing Screen   Have you noticed any hearing difficulties?: No  After whispering 9-1-6 how many numbers did the patient repeat correctly?: 3   Fall Risk Screen   Do you feel unsteady when standing or walking?: No  Do you worry about falling?: No  Have you fallen in the past year?: No   Vision Screen   Right Eye = 20: 20   Left Eye = 20: 20   Depression Screen     Little interest or pleasure in  doing things.: Not at all  Feeling down, depressed, or hopeless: Not at all  PHQ 2 Total: 0     Pain Score   Pain Score:   0 - No pain    Substance Use-Abuse Screening     Tobacco Use     In Past 12 MONTHS, how often have you used any tobacco product (for example, cigarettes, e-cigarettes, cigars, pipes, or smokeless tobacco)?: Daily  In the PAST 3 MONTHS, did you smoke a cigarette containing tobacco or use any other nicotine delivery product (i.e., e-cigarette, vaping or chewing tobacco)?: Yes  In the PAST 3 MONTHS, did you usually smoke more than 10 cigarettes, vape, use an e-cigarette or chew tobacco more than 10 times each day?: Yes  In the PAST 3 MONTHS, did you usually smoke/use an e-cigarette, vape or chew tobacco within 30 minutes after waking?: Yes     Alcohol use     In the PAST 12 MONTHS, how often have you had 5 (men)/4 (women) or more drinks containing alcohol in one day?: Never     Prescription Drug Use     In the PAST 12 months, how often have you used any prescription medications just for the feeling, more than prescribed, or that were not prescribed for you? Prescriptions may include: opioids, benzodiazepines, medications for ADHD: Never           Illicit Drug Use   In the PAST 12 MONTHS, how often have you used any drugs, including marijuana, cocaine or  crack, heroin, methamphetamine, hallucinogens, ecstasy/MDMA?: Never           OBJECTIVE:     BP 110/62   Pulse 69   Temp 36.3 C (97.4 F)   Resp 16   Ht 1.676 m (5' 6" )   Wt 93.5 kg (206 lb 3.2 oz)   LMP  (LMP Unknown)   SpO2 97%   BMI 33.28 kg/m       GENERAL:   Pt is a pleasant, well-nourished, well-developed 56 y.o. female who is in NAD. Appears stated age  89:  head normocephalic, symmetrical facies. EOM intact b/l. PERRLA. Sclera non-icteric, non-injected. upper eyelid w/Xythoma-lipid plaques, diminished in fullness and intensity. Oropharyngeal mucous membranes are moist  w/o erythema/exudates   NECK:   No masses, no  lymphadenopathy, no JVD on exam. Trachea midline, no thyromegaly   CV: S1, S2. No murmurs, rubs, gallops.     LUNGS: CTAB, No rhonchi, rales, wheezes.   GI: (+) BS in all 4 quadrants. soft, NT/ND. No rigidity/positive for guarding/negative for rebound. No organomegaly/masses, or abdominal bruits,   MSK: Spontaneous normal AROM of major joints as observed during exam. No joint effusions/ swelling/ deformities.   Fourth digit he of the left lower extremity is swollen and erythematous.  Tender to touch.  No BLE edema, clubbing, or cyanosis.   2+ radial pulse present b/l.   + 2 reflexes Brachioradialis and patellar bilaterally.   Gait normal.  SKIN: No significant lesions, rashes, ecchymoses noted.   NEURO: AAOx4, CN grossly intact. Sensation intact b/l. No focal deficits.  PSYCH: Mood, behavior and affect normal w/ Intact judgement & insight      Other appropriate exam:      CBC  Diff   Lab Results   Component Value Date/Time    WBC 9.8 12/17/2021 02:14 PM    HGB 14.3 12/17/2021 02:14 PM    HCT 42.7 12/17/2021 02:14 PM    PLTCNT 399 12/17/2021 02:14 PM    RBC 4.78 12/17/2021 02:14 PM    MCV 89.3 12/17/2021 02:14 PM    MCHC 33.5 12/17/2021 02:14 PM    MCH 29.9 12/17/2021 02:14 PM    RDW 14.2 05/08/2021 12:25 PM    MPV 9.6 12/17/2021 02:14 PM    Lab Results   Component Value Date/Time    PMNS 47 05/08/2021 12:25 PM    LYMPHOCYTES 40 05/08/2021 12:25 PM    EOSINOPHIL 2 05/08/2021 12:25 PM    MONOCYTES 12 05/08/2021 12:25 PM    BASOPHILS 1 05/08/2021 12:25 PM    BASOPHILS 0.05 05/08/2021 12:25 PM    PMNABS 4.25 05/08/2021 12:25 PM    LYMPHSABS 3.62 05/08/2021 12:25 PM    EOSABS 0.16 05/08/2021 12:25 PM    MONOSABS 1.07 (H) 05/08/2021 12:25 PM    BASOSABS 0.034 04/01/2011 05:08 AM          COMPREHENSIVE METABOLIC PANEL - NON FASTING  Lab Results   Component Value Date    SODIUM 137 12/17/2021    POTASSIUM 4.0 12/17/2021    CHLORIDE 105 12/17/2021    CO2 26 12/17/2021    ANIONGAP 6 12/17/2021    BUN 11 12/17/2021     CREATININE 0.98 12/17/2021    GLUCOSENF 200 (H) 12/17/2021    CALCIUM 9.1 12/17/2021    ALBUMIN 3.6 12/17/2021    TOTALPROTEIN 6.6 12/17/2021    ALKPHOS 111 12/17/2021    AST 13 12/17/2021    ALT 13 12/17/2021     Diabetes Monitors  A1C: 10.4  A1C Date: 12/19/2021  Kidney Health:   Urine Microalbumin/Cr Ratio--5.9 Ur Microalb/Cr Ratio date--12/19/2021   eGFR --67 eGFR date--12/17/2021    Last Lipid Panel  (Last result in the past 2 years)        Cholesterol   HDL   LDL   Direct LDL   Triglycerides      05/08/21 1225 182   55   96     153            Retinal Exam Date: Not Found :   Last Foot Exam: Not Found      Health Maintenance Due   Topic Date Due    Diabetic Retinal Exam  Never done    Hepatitis B Vaccine (1 of 3 - 3-dose series) Never done    Hepatitis C screening  Never done    HIV Screening  Never done    Adult Tdap-Td (1 - Tdap) Never done    Colonoscopy  Never done    Pap smear  10/18/2013    Shingles Vaccine (1 of 2) Never done    Covid-19 Vaccine (4 - Moderna series) 12/10/2020    Diabetic A1C  03/19/2022      ASSESSMENT & PLAN:   Assessment/Plan   1. Screening for HIV (human immunodeficiency virus)    2. Unspecified inflammatory spondylopathy, lumbar region (CMS [place])    3. 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 Encounter for hepatitis C screening test for low risk patient    5. Encounter for screening colonoscopy    6. Onychomycosis    7. Type 2 diabetes mellitus with hyperglycemia, without long-term current use of insulin (CMS HCC)    8. Encounter for Medicare annual wellness exam       Identified Risk Factors/ Recommended Actions       The PHQ 2 Total: 0 depression screen is interpreted as negative.    Opioid use plan of care:         Opioids use: Plan: Assessment of pain completed and pain controlled        Orders Placed This Encounter    HIV1/HIV2 SCREEN, COMBINED ANTIGEN AND ANTIBODY    HEPATITIS C ANTIBODY SCREEN WITH REFLEX TO HCV PCR    HGA1C (HEMOGLOBIN A1C WITH EST AVG  GLUCOSE)    LIPID PANEL    Referral to External Provider (AMB)    AMB CONSULT/REFERRAL PT-EXTERNAL(PODIATRY)          The patient has been educated about risk factors and recommended preventive care. Written Prevention Plan completed/ updated and given to patient (see After Visit Summary).    We review her most current treatment plan regarding the chronic diseases as below:      Type 2 diabetes mellitus with complication, without long-term current use of insulin (CMS HCC)  Chronic worsening  control  HbA1C has been trending up.  Follow-up with Dr. Alvino Chapel  continue diabetic diet.  Continue to address weight loss which is stalled.  Stop metformin for diarrhea.  Specific does based on A1c.    She will likely need metformin 1000 mg XR, b.i.d.  Continue Ozempic.  Prescribed but not using Jardiance.  Stopped Glyxambi 10-5 mg.    Encouraged her to check blood sugars 3-4 times daily.  Discuss renal concerns and need for monitoring.   A1c ordered as above.    Left lower extremity 4th digit fracture  Continue with orthopedics.  Successful completion of surgery in  pending.  Continue per their recommendations.    She is now weight-bearing.  No special footwear required    Allergic rhinitis:  Continue histamine blocker.  Will add pseudoephedrine.    Coronary artery disease  Status post CABG  Continue Plavix  Continue beta-blocker  Continue statin.  Continue sublingual nitro p.r.n. for chest pain.  Continue with Cardiology.    Depression, unspecified depression type  Chronic, controlled.  See PHQ above.   Continue Effexor    Weight loss:  Weight loss has stabilized.  Continue behavioral efforts.      GERD  Chronic, Uncontrolled problem   Encouraged lifestyle modification to help with acid reflux, including:  Weight loss  Avoid eating for 2-3 hours prior to bedtime. Avoid late night snacking.   Avoid hot/spicy and acidic foods and overeating.   Remain upright after eating, avoid tight clothing.   Elevate head of bed.  Chew gum  or lozenges.  Refrain from alcohol and smoking.   Continue histamine blocker.  Continue  proton pump inhibitor.  Continue General surgery.  She will attend to the emergency room if her symptoms warrant prior to that encounter.    Chronic left-sided lower extremity pain  Stable to improved.  Current diarrhea is felt to be the result of metformin.  Copies of the results are offered to the patient.  Patient with osteoarthritis  Has comorbid peripheral vascular disease  Will increase her statin follow.  Patient has follow-ups with orthopedics for the hip and knee.  Continue follow-up with Dr. Burman Riis in [facility]. for low back pain.    We again discussed use of weight loss as a primary method for pain relief.  Continue on muscle relaxer.  Patient avoids nonsteroidals secondary to her bariatric surgery.  Labs have been reassuring.    Carotid Art Stenosis:   50% stenosis to the right ICA   100% stenosis to left ICA.  Continue with Cardiology, Interventional Cardiology  Has had follow-ups with vascular surgery scheduled but has not completed those encounters.    Will follow.    Right facial nevus  Continue follow-up with derm in [place].     OSA:  Could not keep appt for sleep study,   Continue with Neurology,   missed last appointment on 08/05/2021.        Return in about 6 months (around 01/17/2023), or if symptoms worsen or fail to improve, for In Person Visit, chronic disease management.    Cherly Beach, MD    This note was partially generated using MModal Fluency Direct system, and there may be some incorrect words, spellings, and punctuation that were not noted in checking the note before saving.

## 2022-07-17 NOTE — Patient Instructions (Signed)
Medicare Preventive Services  Medicare coverage information Recommendation for YOU   Heart Disease and Diabetes   Lipid profile Every 5 years or more often if at risk for cardiovascular disease  Last Lipid Panel  (Last result in the past 2 years)      Cholesterol   HDL   LDL   Direct LDL   Triglycerides        05/08/21 1225 182   55   96     153            Diabetes Screening  yearly for those at risk for diabetes, 2 tests per year for those with prediabetes Last Glucose: 200    Diabetes Self Management Training or Medical Nutrition Therapy  For those with diabetes, up to 10 hrs initial training within a year, subsequent years up to 2 hrs of follow up training Optional for those with diabetes     Medical Nutrition Therapy Three hours of one-on-one counseling in first year, two hours in subsequent years Optional for those with diabetes, kidney disease   Intensive Behavioral Therapy for Obesity  Face-to-face counseling, first month every week, month 2-6 every other week, month 7-12 every month if continued progress is documented Optional for those with Body Mass Index 30 or higher  Your Body mass index is 33.28 kg/m.   Tobacco Cessation (Quitting) Counseling   Two attempts per year, max 4 sessions per attempt, up to 8 per year, for those with tobacco-related health condition Optional for those that use tobacco   Cancer Screening   Colorectal screening   For anyone age 39 to 47 or any age if high risk:  Screening Colonoscopy every 10 yrs if low risk,  more frequent if higher risk  OR  Cologuard Stool DNA test once every 3 years OR  Fecal Occult Blood Testing yearly OR  Flexible  Sigmoidoscopy  every 5 yr OR  CT Colonography every 5 yrs    See your schedule below   Screening Pap Test Recommended every 3 years for all women age 55 to 55, or every five years if combined with HPV test (routine screening not needed after total hysterectomy).  Medicare covers every 2 years, up to yearly if high risk.  Screening Pelvic Exam  Medicare covers every 2 years, yearly if high risk or childbearing age with abnormal Pap in last 3 yrs. See your schedule below   Screening Mammogram   Recommended every 2 years for women age 37 to 48, or more frequent if you have a higher risk. Selectively recommended for women between 40-49 based on shared decisions about risk. Covered by Medicare up to every year for women age 79 or older See your schedule below   Lung Cancer Screening  Annual low dose computed tomography (LDCT scan) is recommended for those age 23-77 who smoked 20 pack-years and are current smokers or quit smoking within past 15 years (one pack-year= smoking one PPD for one year), after counseling by your doctor or nurse clinician about the possible benefits or harms. See your schedule below   Vaccinations   Pneumococcal Vaccine: Recommended routinely age 25+ with one or two separate vaccines based on your risk    Recommended before age 50 if medical conditions with increased risk  Seasonal Influenza Vaccine: Once every flu season   Hepatitis B Vaccine: 3 doses if risk (including anyone with diabetes or liver disease)  Shingles Vaccine: Two doses at age 70 or older  Diphtheria Tetanus Pertussis Vaccine:  ONCE as adult, booster every 10 years     Immunization History   Administered Date(s) Administered   . Covid-19 Vaccine,Moderna,12 Years+ 02/09/2020, 03/08/2020, 10/15/2020   . Influenza Vaccine, 6 month-adult 08/08/2010, 10/13/2012, 08/06/2017, 09/16/2018, 08/31/2019, 10/02/2020, 09/12/2021   . Pneumovax 10/02/2010   . VAXNEUVANCE(PCV 15) 09/12/2021     Shingles vaccine and Diphtheria Tetanus Pertussis vaccines are available at pharmacies or local health department without a prescription.   Other Screening   Bone Densitometry   Every 24 months for anyone at risk, including postmenopausal       Glaucoma Screening   Yearly if in high risk group such as diabetes, family history, African American age 78+ or Hispanic American age 13+      Hepatitis C  Screening recommended ONCE for those born between 1945-1965, or high risk for HCV infection       HIV Testing recommended routinely at least ONCE, covered every year for age 47 to 36 regardless of risk, and every year for age over 26 who ask for the test or higher risk  Yearly or up to 3 times in pregnancy         Abdominal Aortic Aneurysm Screening Ultrasound   Once between the age of 40-75 with a family history of AAA       Your Personalized Schedule for Preventive Tests   Health Maintenance: Pending and Last Completed       Date Due Completion Date    Diabetic Retinal Exam Never done ---    Hepatitis B Vaccine (1 of 3 - 3-dose series) Never done ---    Hepatitis C screening Never done ---    HIV Screening Never done ---    Adult Tdap-Td (1 - Tdap) Never done ---    Colonoscopy Never done ---    Pap smear 10/18/2013 10/18/2010    Shingles Vaccine (1 of 2) Never done ---    Covid-19 Vaccine (4 - Moderna series) 12/10/2020 10/15/2020    Diabetic A1C 03/19/2022 12/19/2021    Influenza Vaccine (1) 08/08/2022 09/12/2021    Diabetic Kidney Health eGFR 12/17/2022 12/17/2021    Diabetic Kidney Health Microalb/Cr Ratio 12/19/2022 12/19/2021    Mammography 02/07/2023 02/06/2021    Annual Wellness Visit 07/18/2023 07/17/2022             Non-Opioid Treatment for Chronic Pains   Treatment for chronic pain can be managed without opioids. Below are non-opioid options that may be considered and discussed with your provider to determine which options would be best for your health.    Over the counter or presciptions medications:  Acetaminophen (Tylenol) or Non-steroidal anti-inflammatories such as: Ibuprofen (Motrin, Advil), naproxen (Aleve), aspirin  Antidepressants such as amitriptyline, nortriptyline (Pamelor),  Doxepin (Silenor), Imipramine (Tofranil) and others.  Anticonvulsant Nerve pain medications: Gabapentin (Neurontin), pregabalin (Lyrica)  Externally applied medications such as NSAID'S, lidocaine, capsaisin, and  others  Injections: pain specialists can sometimes inject medications at the site of pain.    Alternative therapies such as  Acupuncture  Osteopathic manipulation  Chiropractic  Massage therapy

## 2022-07-17 NOTE — Nursing Note (Signed)
07/17/22 1406   Medicare Wellness Assessment   Medicare initial or wellness physical in the last year? No   Advance Directives   Does patient have a living will or MPOA no   Has patient provided [employer] with a copy? no   Advance directive information given to the patient today? no   Activities of Daily Living   Do you need help with dressing, bathing, or walking? No   Do you need help with shopping, housekeeping, medications, or finances? No   Do you have rugs in hallways, broken steps, or poor lighting? No   Do you have grab bars in your bathroom, non-slip strips in your tub, and hand rails on your stairs? No   Cognitive Function Screen   What is you age? 1   What is the time to the nearest hour? 1   What is the year? 1   What is the name of this clinic? 1   Can the patient recognize two persons (the doctor, the nurse, home help, etc.)? 1   What is the date of your birth? (day and month sufficient)  1   In what year did World War II end? 1   Who is the current president of the [place]? 1   Count from 20 down to 1? 1   What address did I give you earlier? 1   Total Score 10   Interpretation of Total Score Greater than 6 Normal   Depression Screen   Little interest or pleasure in doing things. 0   Feeling down, depressed, or hopeless 0   PHQ 2 Total 0   Pain Score   Pain Score Zero   Substance Use Screening   In Past 12 MONTHS, how often have you used any tobacco product (for example, cigarettes, e-cigarettes, cigars, pipes, or smokeless tobacco)? Daily   In the PAST 3 MONTHS, did you smoke a cigarette containing tobacco or use any other nicotine delivery product (i.e., e-cigarette, vaping or chewing tobacco)? Yes   In the PAST 3 MONTHS, did you usually smoke more than 10 cigarettes, vape, use an e-cigarette or chew tobacco more than 10 times each day? Yes   In the PAST 3 MONTHS, did you usually smoke/use an e-cigarette, vape or chew tobacco within 30 minutes after waking? Yes   In the PAST 12 MONTHS,  how often have you had 5 (men)/4 (women) or more drinks containing alcohol in one day? Never   In the PAST 12 months, how often have you used any prescription medications just for the feeling, more than prescribed, or that were not prescribed for you? Prescriptions may include: opioids, benzodiazepines, medications for ADHD Never   In the PAST 12 MONTHS, how often have you used any drugs, including marijuana, cocaine or crack, heroin, methamphetamine, hallucinogens, ecstasy/MDMA? Never   Hearing Screen   Have you noticed any hearing difficulties? No   After whispering 9-1-6 how many numbers did the patient repeat correctly? 3   Fall Risk Assessment   Do you feel unsteady when standing or walking? No   Do you worry about falling? No   Have you fallen in the past year? No   Vision Screen   Right Eye = 20 20   Left Eye = 20 20

## 2022-07-22 ENCOUNTER — Encounter (INDEPENDENT_AMBULATORY_CARE_PROVIDER_SITE_OTHER): Payer: Self-pay | Admitting: Family Medicine

## 2022-07-28 ENCOUNTER — Other Ambulatory Visit: Payer: Self-pay

## 2022-08-22 ENCOUNTER — Other Ambulatory Visit: Payer: Self-pay

## 2022-08-22 ENCOUNTER — Ambulatory Visit (INDEPENDENT_AMBULATORY_CARE_PROVIDER_SITE_OTHER): Payer: Commercial Managed Care - PPO | Admitting: Registered Nurse

## 2022-08-22 ENCOUNTER — Encounter (INDEPENDENT_AMBULATORY_CARE_PROVIDER_SITE_OTHER): Payer: Self-pay

## 2022-08-22 ENCOUNTER — Other Ambulatory Visit: Payer: Commercial Managed Care - PPO | Attending: Registered Nurse | Admitting: Registered Nurse

## 2022-08-22 VITALS — BP 100/60 | HR 75 | Temp 97.3°F | Resp 20 | Ht 66.0 in | Wt 213.0 lb

## 2022-08-22 DIAGNOSIS — J069 Acute upper respiratory infection, unspecified: Secondary | ICD-10-CM

## 2022-08-22 DIAGNOSIS — F17210 Nicotine dependence, cigarettes, uncomplicated: Secondary | ICD-10-CM

## 2022-08-22 DIAGNOSIS — Z20822 Contact with and (suspected) exposure to covid-19: Secondary | ICD-10-CM

## 2022-08-22 LAB — COVID-19 ~~LOC~~ MOLECULAR LAB TESTING: SARS-CoV-2: NOT DETECTED

## 2022-08-22 MED ORDER — BENZONATATE 200 MG CAPSULE
200.0000 mg | ORAL_CAPSULE | Freq: Three times a day (TID) | ORAL | 0 refills | Status: DC
Start: 2022-08-22 — End: 2022-09-18

## 2022-08-22 MED ORDER — FLUTICASONE PROPIONATE 50 MCG/ACTUATION NASAL SPRAY,SUSPENSION
2.0000 | Freq: Every day | NASAL | 0 refills | Status: DC
Start: 2022-08-22 — End: 2024-05-05

## 2022-08-22 NOTE — Patient Instructions (Addendum)
Drink plenty of fluids to keep yourself hydrated  Rest as needed  Tylenol and/or ibuprofen (whichever one you can take)  May start Flonase  May start Tessalon Perles for cough  If symptoms persist or worsen follow up.      COVID pending    Educated about Paxlovid and Molnupiravir.  Not FDA approved, med-med interactions, and possible side-effects.  Handouts provided.

## 2022-08-22 NOTE — Progress Notes (Signed)
[address] [facility]  [place]  [place] [address]  [phone]    Progress Note    Date: 08/22/2022  Name: Monique [name]   MRN: [id number]   DOB: 01/10/1966   PCP: Monique Beach, MD    Reason for Visit: Exposure To Coronavirus (H/A, stuffy nose, bil. Earache, cough X 2 days)    History of Present Illness  Monique [name] is a 56 y.o. female who is being seen today for above stated symptoms.  She has taken some Tylenol for her headache.  Mother-in-law + COVID.    Nursing Notes:  There are no exam notes on file for this visit.    Current Outpatient Medications   Medication Sig    aspirin (ECOTRIN) 81 mg Oral Tablet, Delayed Release (E.C.) Take 1 Tablet (81 mg total) by mouth Once a day    Benzonatate (TESSALON) 200 mg Oral Capsule Take 1 Capsule (200 mg total) by mouth Three times a day for 10 days May cause drowsiness    Blood Sugar Diagnostic (ACCU-CHEK GUIDE TEST STRIPS) Strip TEST BLOOD SUGAR THREE TIMES DAILY    Blood-Glucose Meter (ACCU-CHEK AVIVA PLUS METER) Misc 1 Kit Three times a day    clopidogreL (PLAVIX) 75 mg Oral Tablet TAKE 1 TABLET BY MOUTH ONCE DAILY    cyanocobalamin (VITAMIN B12) 1,000 mcg/mL Injection Solution Inject 1 mL (1,000 mcg total) under the skin Every 30 days    diclofenac sodium (VOLTAREN) 1 % Gel Apply 2 g topically Three times a day as needed (Patient not taking: Reported on 05/30/2022)    docusate sodium (COLACE) 100 mg Oral Capsule TAKE 1 CAPSULE TWICE DAILY    empagliflozin (JARDIANCE) 10 mg Oral Tablet Take 1 Tablet (10 mg total) by mouth Once a day    ergocalciferol, vitamin D2, (DRISDOL) 1,250 mcg (50,000 unit) Oral Capsule TAKE 1 CAPSULE BY MOUTH EVERY WEEK    fluticasone propionate (FLONASE) 50 mcg/actuation Nasal Spray, Suspension Administer 2 Sprays into each nostril Once a day    furosemide (LASIX) 40 mg Oral Tablet TAKE 1 TABLET(40 MG) BY MOUTH EVERY DAY    HYDROcodone-acetaminophen (NORCO) 10-325 mg Oral Tablet Take 1 Tablet by  mouth Every 8 hours as needed for Pain Monique [name] with [facility]    Lancets (ACCU-CHEK SOFTCLIX LANCETS) Misc To use daily    levothyroxine (SYNTHROID) 25 mcg Oral Tablet Take 1 Tablet (25 mcg total) by mouth Once a day    metoprolol succinate (TOPROL-XL) 25 mg Oral Tablet Sustained Release 24 hr Take 0.5 Tablets (12.5 mg total) by mouth Once a day    NARCAN 4 mg/actuation Nasal Spray, Non-Aerosol PT STATED SHE HAS NARCAN AT HOME BUT IS NOT TAKING IT    Needle, Disp, 25 G 25 gauge x 1" Needle 1 mL Every 30 days    niacin (NIASPAN) 500 mg Oral Tablet Sustained Release Take 1 Tablet (500 mg total) by mouth Once a day    nitroGLYCERIN (NITROSTAT) 0.4 mg Sublingual Tablet, Sublingual Place 1 Tablet (0.4 mg total) under the tongue Every 5 minutes as needed for Chest pain for 3 doses over 15 minutes    omeprazole (PRILOSEC) 40 mg Oral Capsule, Delayed Release(E.C.) TAKE 1 CAPSULE BY MOUTH EVERY DAY    ondansetron (ZOFRAN ODT) 8 mg Oral Tablet, Rapid Dissolve DISSOLVE 1 TABLET ON THE TONGUE EVERY 8 HOURS AS NEEDED FOR NAUSEA OR VOMITING    PRENATAL PLUS, CALCIUM CARB, 27 mg iron- 1 mg  Oral Tablet Take 1 Tablet by mouth Once a day    rosuvastatin (CRESTOR) 40 mg Oral Tablet Take 1 Tablet (40 mg total) by mouth Once a day TAKE 1 TABLET BY MOUTH ONCE DAILY    semaglutide (OZEMPIC) 0.25 mg or 0.5 mg(2 mg/1.5 mL) Subcutaneous Pen Injector Inject 0.5 mg under the skin Every 7 days    sennosides-docusate sodium (SENOKOT-S) 8.6-50 mg Oral Tablet Take 1 Tablet by mouth Every evening    venlafaxine (EFFEXOR XR) 37.5 mg Oral Capsule, Sust. Release 24 hr Take 1 Capsule (37.5 mg total) by mouth Once a day    ZYRTEC-D 5-120 mg Oral Tablet Sustained Release 12 hr Take 1 Tablet by mouth Once a day     Allergies   Allergen Reactions    Capron Dm [Pyrilamine-Dextromethorphan]  Other Adverse Reaction (Add comment)     LIGHT HEADED    Other      Coban dressing caused skin break down    Tylenol W Codeine  [Acetaminophen-Codeine]  Other Adverse Reaction (Add comment)     pt states that one time she had chest pains with this med, but dr. thought it was because she had not eaten     Past Medical History:   Diagnosis Date    Anxiety     Arthritis     Back problem     DDD    Beta blocker prescribed for left ventricular systolic dysfunction     Blood thinned due to long-term anticoagulant use     Carotid stenosis     occlusion of left ICA    Chronic back pain     Coronary artery disease involving native coronary artery of native heart     DDD (degenerative disc disease), lumbar     "entire spine"    Ear piercing     GERD (gastroesophageal reflux disease)     controlled with omeprazole    H/O complete eye exam     2 years, Dr. 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 [place] ordered.     Stented coronary artery     x4    Stroke (CMS [facility])     2002    Tattoo     Left breast, Right shoulder    Thyroid disorder     Thyroid nodule     Type 2 diabetes mellitus (CMS [facility])     HGA1C 10.4 on 12/19/21    Uncontrolled type 2 diabetes mellitus, without long-term current use of insulin     Xanthelasma          Past Surgical History:   Procedure Laterality Date    ENDOMETRIAL ABLATION  1998    HX ANKLE FRACTURE TX  1990s    Right, Doran Durand procedure    HX CATARACT REMOVAL Bilateral 2013    r and l eyes with implants    HX CESAREAN SECTION  06/20/2000  x3, 11/21/86, 11/10/84    HX CHOLECYSTECTOMY  1988    HX CORONARY ARTERY BYPASS GRAFT  03/31/2011    cardiac, 2 vessel, Dr. [name], [facility]    HX CORONARY STENT PLACEMENT  2012    x4 stents    HX  GASTRIC SLEEVE  02/2017    [place], [place], Dr. [name] HEART CATHETERIZATION  2012    massive heart attack and stents - [facility]    HX THYROID BIOPSY      HX THYROIDECTOMY  101/01/2018    HX TONSILLECTOMY      as child    HX YAG Left 09/20/2013         Family Medical History:       Problem Relation (Age of Onset)    Atrial fibrillation Sister    Colon Cancer Father    Diabetes Mother, Maternal Aunt    Heart Disease Sister, Maternal Grandfather    Hypertension (High Blood Pressure) Mother    Lung Cancer Maternal Grandfather    MI <60 years of age Mother            Social History     Tobacco Use    Smoking status: Every Day     Packs/day: 0.50     Years: 20.00     Pack years: 10.00     Types: Cigarettes     Start date: 57    Smokeless tobacco: Never   Vaping Use    Vaping Use: Never used   Substance Use Topics    Alcohol use: No    Drug use: No       Review of Systems  Negative unless otherwise mentioned in HPI    Physical Exam:  Vitals:    08/22/22 1433   BP: 100/60   Pulse: 75   Resp: 20   Temp: 36.3 C (97.3 F)   SpO2: 96%   Weight: 96.6 kg (213 lb)   Height: 1.676 m (_0 )   BMI: 34.45      Physical Exam  Vitals and nursing note reviewed. Exam conducted with a chaperone present.   Constitutional:       Appearance: Normal appearance.   HENT:      Right Ear: Tympanic membrane normal.      Left Ear: Tympanic membrane normal.      Nose: Congestion and rhinorrhea present.      Mouth/Throat:      Mouth: Mucous membranes are moist.   Cardiovascular:      Rate and Rhythm: Normal rate and regular rhythm.   Pulmonary:      Effort: Pulmonary effort is normal.      Breath sounds: Normal breath sounds and air entry.   Musculoskeletal:      Cervical back: Neck supple.   Skin:     General: Skin is warm and dry.   Neurological:      Mental Status: She is alert and oriented to person, place, and time.   Psychiatric:         Mood and Affect: Mood normal.         Behavior: Behavior normal.       Assessment:  Problem List Items  Addressed This Visit    None  Visit Diagnoses       Upper respiratory virus    -  Primary    Relevant Medications    fluticasone propionate (FLONASE) 50 mcg/actuation Nasal Spray, Suspension    Benzonatate (TESSALON) 200 mg  Oral Capsule    Other Relevant Orders    COVID-19 SCREENING - SYMPTOMATIC (OP)            Plan/Patient Instructions  Patient Instructions   Drink plenty of fluids to keep yourself hydrated  Rest as needed  Tylenol and/or ibuprofen (whichever one you can take)  May start Flonase  May start Tessalon Perles for cough  If symptoms persist or worsen follow up.      COVID pending      Follow up with PCP.  Seek medical attention for new or worsening symptoms.    I saw the patient independently.        Benjaman Kindler, NP

## 2022-08-25 ENCOUNTER — Ambulatory Visit (INDEPENDENT_AMBULATORY_CARE_PROVIDER_SITE_OTHER): Payer: Commercial Managed Care - PPO | Admitting: NURSE PRACTITIONER

## 2022-08-25 ENCOUNTER — Other Ambulatory Visit (INDEPENDENT_AMBULATORY_CARE_PROVIDER_SITE_OTHER): Payer: Self-pay | Admitting: Family Medicine

## 2022-08-25 ENCOUNTER — Other Ambulatory Visit: Payer: Commercial Managed Care - PPO | Attending: NURSE PRACTITIONER | Admitting: NURSE PRACTITIONER

## 2022-08-25 ENCOUNTER — Encounter (INDEPENDENT_AMBULATORY_CARE_PROVIDER_SITE_OTHER): Payer: Self-pay

## 2022-08-25 ENCOUNTER — Other Ambulatory Visit: Payer: Self-pay

## 2022-08-25 VITALS — BP 120/78 | HR 68 | Temp 97.2°F | Resp 18 | Ht 66.0 in | Wt 201.0 lb

## 2022-08-25 DIAGNOSIS — F17210 Nicotine dependence, cigarettes, uncomplicated: Secondary | ICD-10-CM

## 2022-08-25 DIAGNOSIS — R059 Cough, unspecified: Secondary | ICD-10-CM

## 2022-08-25 DIAGNOSIS — J329 Chronic sinusitis, unspecified: Secondary | ICD-10-CM

## 2022-08-25 DIAGNOSIS — Z20822 Contact with and (suspected) exposure to covid-19: Secondary | ICD-10-CM

## 2022-08-25 DIAGNOSIS — E1165 Type 2 diabetes mellitus with hyperglycemia: Secondary | ICD-10-CM

## 2022-08-25 LAB — COVID-19 CEPHEID - LAB USE ONLY: SARS-CoV-2: NOT DETECTED

## 2022-08-25 MED ORDER — GUAIFENESIN 100 MG/5 ML ORAL LIQUID
200.0000 mg | ORAL | 1 refills | Status: DC
Start: 2022-08-25 — End: 2022-12-31

## 2022-08-25 MED ORDER — AMOXICILLIN 875 MG-POTASSIUM CLAVULANATE 125 MG TABLET
1.0000 | ORAL_TABLET | Freq: Two times a day (BID) | ORAL | 0 refills | Status: DC
Start: 2022-08-25 — End: 2022-09-18

## 2022-08-25 NOTE — Patient Instructions (Signed)
Self quarantine until COVID-19 test results are received.  We will call with the test results  Follow up immediately for new or worsening symptoms. Follow up with PCP if symptoms persist. Tylenol for pain and fever. Follow up if not improving after 48 to 72 hours.

## 2022-08-25 NOTE — Progress Notes (Signed)
[facility]    Progress Note    Name: Monique [name] MRN:  [id number]   Date: 08/25/2022 Age: 56 y.o.         Reason for Visit: Cough (Negative covid test 3 days ago, covid exposure, headache, itchy ears, cough, chills and sweats, pt was told to get rechecked in 2- 3 days)    History of Present Illness  Monique [name] is a 56 y.o. female who is being seen today for The above stated symptoms, denies alleviating factors. Symptoms 5 days. Would like covid test. Close exposure to covid in last week by family member.      Nursing Notes:  There are no exam notes on file for this visit.    Past Medical History:   Diagnosis Date    Anxiety     Arthritis     Back problem     DDD    Beta blocker prescribed for left ventricular systolic dysfunction     Blood thinned due to long-term anticoagulant use     Carotid stenosis     occlusion of left ICA    Chronic back pain     Coronary artery disease involving native coronary artery of native heart     DDD (degenerative disc disease), lumbar     "entire spine"    Ear piercing     GERD (gastroesophageal reflux disease)     controlled with omeprazole    H/O complete eye exam     2 years, Dr. [name], at [facility]    History of dental examination     dentures upper and lower    HTN     Hx of coronary artery bypass graft     2012    Hx of gastric bypass     Hypercholesterolemia     Hyperlipidemia     Hypothyroid     MI (myocardial infarction) (CMS [place]) 2012    Neck problem     herniated cervical disc    Obesity (BMI 30-39.9)     Solitary nodule of right lobe of thyroid 03/30/2018    Incidental finding on MRI thoracic spine. [place] ordered.     Stented coronary artery     x4    Stroke (CMS [facility])     2002    Tattoo     Left breast, Right shoulder    Thyroid disorder     Thyroid nodule     Type 2 diabetes mellitus (CMS HCC)     HGA1C 10.4 on 12/19/21    Uncontrolled type 2 diabetes mellitus, without long-term current use of insulin     Xanthelasma           Past Surgical History:   Procedure Laterality Date    ENDOMETRIAL ABLATION  1998    HX ANKLE FRACTURE [place]    Right, Doran Durand procedure    HX CATARACT REMOVAL Bilateral 2013    r and l eyes with implants    HX CESAREAN SECTION  06/20/2000    x3, 11/21/86, 11/10/84    HX CHOLECYSTECTOMY  1988    HX CORONARY ARTERY BYPASS GRAFT  03/31/2011    cardiac, 2 vessel, Dr. [name], [place]  2012    x4 stents    HX GASTRIC SLEEVE  02/2017    [place], [place], Dr. [name] HEART CATHETERIZATION  2012    massive  heart attack and stents - [facility]    HX THYROID BIOPSY      HX THYROIDECTOMY  101/01/2018    HX TONSILLECTOMY      as child    HX YAG Left 09/20/2013         Current Outpatient Medications   Medication Sig    amoxicillin-pot clavulanate (AUGMENTIN) 875-125 mg Oral Tablet Take 1 Tablet by mouth Twice daily for 10 days    aspirin (ECOTRIN) 81 mg Oral Tablet, Delayed Release (E.C.) Take 1 Tablet (81 mg total) by mouth Once a day    Benzonatate (TESSALON) 200 mg Oral Capsule Take 1 Capsule (200 mg total) by mouth Three times a day for 10 days May cause drowsiness    Blood Sugar Diagnostic (ACCU-CHEK GUIDE TEST STRIPS) Strip TEST BLOOD SUGAR THREE TIMES DAILY    Blood-Glucose Meter (ACCU-CHEK AVIVA PLUS METER) Misc 1 Kit Three times a day    clopidogreL (PLAVIX) 75 mg Oral Tablet TAKE 1 TABLET BY MOUTH ONCE DAILY    cyanocobalamin (VITAMIN B12) 1,000 mcg/mL Injection Solution Inject 1 mL (1,000 mcg total) under the skin Every 30 days    diclofenac sodium (VOLTAREN) 1 % Gel Apply 2 g topically Three times a day as needed (Patient not taking: Reported on 05/30/2022)    docusate sodium (COLACE) 100 mg Oral Capsule TAKE 1 CAPSULE TWICE DAILY    empagliflozin (JARDIANCE) 10 mg Oral Tablet Take 1 Tablet (10 mg total) by mouth Once a day    ergocalciferol, vitamin D2, (DRISDOL) 1,250 mcg (50,000 unit) Oral Capsule TAKE 1 CAPSULE BY MOUTH EVERY WEEK    fluticasone propionate (FLONASE)  50 mcg/actuation Nasal Spray, Suspension Administer 2 Sprays into each nostril Once a day    furosemide (LASIX) 40 mg Oral Tablet TAKE 1 TABLET(40 MG) BY MOUTH EVERY DAY    guaiFENesin 100 mg/5 mL Oral Liquid Take 10 mL (200 mg total) by mouth Every 4 hours    HYDROcodone-acetaminophen (NORCO) 10-325 mg Oral Tablet Take 1 Tablet by mouth Every 8 hours as needed for Pain [name] with [facility]    Lancets (ACCU-CHEK SOFTCLIX LANCETS) Misc To use daily    levothyroxine (SYNTHROID) 25 mcg Oral Tablet Take 1 Tablet (25 mcg total) by mouth Once a day    metoprolol succinate (TOPROL-XL) 25 mg Oral Tablet Sustained Release 24 hr Take 0.5 Tablets (12.5 mg total) by mouth Once a day    NARCAN 4 mg/actuation Nasal Spray, Non-Aerosol PT STATED SHE HAS NARCAN AT HOME BUT IS NOT TAKING IT    Needle, Disp, 25 G 25 gauge x 1" Needle 1 mL Every 30 days    niacin (NIASPAN) 500 mg Oral Tablet Sustained Release Take 1 Tablet (500 mg total) by mouth Once a day    nitroGLYCERIN (NITROSTAT) 0.4 mg Sublingual Tablet, Sublingual Place 1 Tablet (0.4 mg total) under the tongue Every 5 minutes as needed for Chest pain for 3 doses over 15 minutes    omeprazole (PRILOSEC) 40 mg Oral Capsule, Delayed Release(E.C.) TAKE 1 CAPSULE BY MOUTH EVERY DAY    ondansetron (ZOFRAN ODT) 8 mg Oral Tablet, Rapid Dissolve DISSOLVE 1 TABLET ON THE TONGUE EVERY 8 HOURS AS NEEDED FOR NAUSEA OR VOMITING    PRENATAL PLUS, CALCIUM CARB, 27 mg iron- 1 mg Oral Tablet Take 1 Tablet by mouth Once a day    rosuvastatin (CRESTOR) 40 mg Oral Tablet Take 1 Tablet (40 mg total) by mouth Once a day TAKE 1  TABLET BY MOUTH ONCE DAILY    semaglutide (OZEMPIC) 0.25 mg or 0.5 mg(2 mg/1.5 mL) Subcutaneous Pen Injector Inject 0.5 mg under the skin Every 7 days    sennosides-docusate sodium (SENOKOT-S) 8.6-50 mg Oral Tablet Take 1 Tablet by mouth Every evening    venlafaxine (EFFEXOR XR) 37.5 mg Oral Capsule, Sust. Release 24 hr Take 1 Capsule (37.5 mg total)  by mouth Once a day    ZYRTEC-D 5-120 mg Oral Tablet Sustained Release 12 hr Take 1 Tablet by mouth Once a day     Allergies   Allergen Reactions    Capron Dm [Pyrilamine-Dextromethorphan]  Other Adverse Reaction (Add comment)     LIGHT HEADED    Other      Coban dressing caused skin break down    Tylenol W Codeine [Acetaminophen-Codeine]  Other Adverse Reaction (Add comment)     pt states that one time she had chest pains with this med, but dr. thought it was because she had not eaten     Family Medical History:       Problem Relation (Age of Onset)    Atrial fibrillation Sister    Colon Cancer Father    Diabetes Mother, Maternal Aunt    Heart Disease Sister, Maternal Grandfather    Hypertension (High Blood Pressure) Mother    Lung Cancer Maternal Grandfather    MI <28 years of age Mother            Social History     Tobacco Use    Smoking status: Every Day     Packs/day: 0.50     Years: 20.00     Pack years: 10.00     Types: Cigarettes     Start date: 46    Smokeless tobacco: Never   Vaping Use    Vaping Use: Never used   Substance Use Topics    Alcohol use: No    Drug use: No       Review of Systems  Review of Systems   Constitutional:  Positive for chills.   HENT:  Positive for ear pain.    Respiratory:  Positive for cough.    Neurological:  Positive for headaches.     Physical Exam:  BP 120/78   Pulse 68   Temp 36.2 C (97.2 F)   Resp 18   Ht 1.676 m (5' 6" )   Wt 91.2 kg (201 lb)   LMP  (LMP Unknown)   SpO2 97%   BMI 32.44 kg/m       Physical Exam  Vitals and nursing note reviewed.   Constitutional:       General: She is not in acute distress.  HENT:      Head: Normocephalic and atraumatic.      Right Ear: Tympanic membrane, ear canal and external ear normal.      Left Ear: Tympanic membrane, ear canal and external ear normal.      Nose:      Right Sinus: Maxillary sinus tenderness and frontal sinus tenderness present.      Left Sinus: Maxillary sinus tenderness and frontal sinus tenderness  present.      Mouth/Throat:      Mouth: Mucous membranes are moist.      Pharynx: Oropharynx is clear.   Eyes:      Conjunctiva/sclera: Conjunctivae normal.   Cardiovascular:      Rate and Rhythm: Normal rate and regular rhythm.      Heart sounds: Normal heart  sounds.   Pulmonary:      Effort: Pulmonary effort is normal. No respiratory distress.      Breath sounds: Normal breath sounds.   Lymphadenopathy:      Cervical: No cervical adenopathy.   Skin:     General: Skin is warm and dry.   Neurological:      Mental Status: She is alert and oriented to person, place, and time.   Psychiatric:         Mood and Affect: Affect normal.         Judgment: Judgment normal.       Assessment:    Close exposure to COVID-19 virus    Cough, unspecified type    Sinusitis, unspecified chronicity, unspecified location        Plan:    Patient Instructions   Self quarantine until COVID-19 test results are received.  We will call with the test results  Follow up immediately for new or worsening symptoms. Follow up with PCP if symptoms persist. Tylenol for pain and fever. Follow up if not improving after 48 to 72 hours.       Orders Placed This Encounter    COVID-19 [place] [facility]    amoxicillin-pot clavulanate (AUGMENTIN) 875-125 mg Oral Tablet    guaiFENesin 100 mg/5 mL Oral Liquid       Follow up: No follow-ups on file.    Follow up with PCP.  Seek medical attention for new or worsening symptoms.    This note was partially created using MModal Fluency Direct system (voice recognition software) and is inherently subject to errors including those of syntax and "sound-alike" substitutions which may escape proofreading.  In such instances, original meaning may be extrapolated by contextual derivation.     I saw the patient independently.            Kavin Leech, FNP  08/25/2022, 12:27

## 2022-08-25 NOTE — Addendum Note (Signed)
Addended by: Alfonzo Beers on: 08/25/2022 02:21 PM     Modules accepted: Orders

## 2022-08-26 ENCOUNTER — Other Ambulatory Visit (INDEPENDENT_AMBULATORY_CARE_PROVIDER_SITE_OTHER): Payer: Self-pay | Admitting: Family Medicine

## 2022-08-26 DIAGNOSIS — I10 Essential (primary) hypertension: Secondary | ICD-10-CM

## 2022-08-26 DIAGNOSIS — R110 Nausea: Secondary | ICD-10-CM

## 2022-08-28 ENCOUNTER — Other Ambulatory Visit (INDEPENDENT_AMBULATORY_CARE_PROVIDER_SITE_OTHER): Payer: Self-pay | Admitting: Family Medicine

## 2022-08-28 DIAGNOSIS — E119 Type 2 diabetes mellitus without complications: Secondary | ICD-10-CM

## 2022-08-28 DIAGNOSIS — E1165 Type 2 diabetes mellitus with hyperglycemia: Secondary | ICD-10-CM

## 2022-08-28 DIAGNOSIS — I10 Essential (primary) hypertension: Secondary | ICD-10-CM

## 2022-08-28 DIAGNOSIS — R110 Nausea: Secondary | ICD-10-CM

## 2022-08-28 MED ORDER — ACCU-CHEK GUIDE TEST STRIPS
ORAL_STRIP | 3 refills | Status: DC
Start: 2022-08-28 — End: 2023-02-25

## 2022-08-28 MED ORDER — METOPROLOL SUCCINATE ER 25 MG TABLET,EXTENDED RELEASE 24 HR
12.5000 mg | ORAL_TABLET | Freq: Every day | ORAL | 3 refills | Status: DC
Start: 2022-08-28 — End: 2024-09-21

## 2022-08-28 MED ORDER — LANCETS
2 refills | Status: DC
Start: 2022-08-28 — End: 2023-02-24

## 2022-08-28 MED ORDER — ONDANSETRON 8 MG DISINTEGRATING TABLET
ORAL_TABLET | ORAL | 2 refills | Status: DC
Start: 2022-08-28 — End: 2023-03-02

## 2022-08-28 NOTE — Telephone Encounter (Signed)
Last scheduled appointment with you was 07/17/2022.     Currently scheduled future appointment is 09/18/2022.      Reymundo Poll, RN  08/28/2022, 14:50

## 2022-08-28 NOTE — Telephone Encounter (Signed)
Patient call stating need for lancets and test strips.  Monique [name]

## 2022-09-16 ENCOUNTER — Ambulatory Visit: Payer: BLUE CROSS/BLUE SHIELD | Admitting: Gastroenterology

## 2022-09-17 ENCOUNTER — Other Ambulatory Visit: Payer: Self-pay

## 2022-09-18 ENCOUNTER — Encounter (INDEPENDENT_AMBULATORY_CARE_PROVIDER_SITE_OTHER): Payer: Self-pay | Admitting: Family Medicine

## 2022-09-18 ENCOUNTER — Ambulatory Visit (INDEPENDENT_AMBULATORY_CARE_PROVIDER_SITE_OTHER): Payer: Commercial Managed Care - PPO | Admitting: Family Medicine

## 2022-09-18 VITALS — BP 116/60 | HR 67 | Temp 97.8°F | Resp 17 | Ht 66.0 in | Wt 215.0 lb

## 2022-09-18 DIAGNOSIS — Z23 Encounter for immunization: Secondary | ICD-10-CM

## 2022-09-18 DIAGNOSIS — E1165 Type 2 diabetes mellitus with hyperglycemia: Secondary | ICD-10-CM

## 2022-09-18 NOTE — Progress Notes (Signed)
FAMILY [facility]ICINE, [facility]  [place] [address]       Name: Monique [name] MRN:  [id number]   Date: 09/18/2022 Age: 56 y.o.     Chief complaint: The patient is a 56 y.o. old female who came in today for Diabetes (Last A1C 12/19/21 -- 10.4%//Patient reports she sees Dr. Tennis Ship and A1C was 8.1% )    HPI:    Diabetes: Last HbA1C in our system was 10.4% in January 2023.  Patient reports her last A1c at Dr. Alvino Chapel 10s office was 8.1%. Patient denies symptoms of polydipsia, polyuria, hypoglycemic unawareness, erectile dysfunction, chest pain, dyspnea on exertion, diarrhea, foot ulcerations, paresthesias. Symptoms are not changed. Patient's home blood sugars are poorly controlled. Patient is compliant with medications.  Patient is currently prescribed Ozempic, and Jardiance.     Since our last encounter she is been to [place] to be ruled out for COVID.  Overall feels she is improving.    ROS:  Review of systems completed with all positives and pertinent negatives as per HPI. Otherwise, negative.    Past medical history:  Past Medical History:   Diagnosis Date    Anxiety     Arthritis     Back problem     DDD    Beta blocker prescribed for left ventricular systolic dysfunction     Blood thinned due to long-term anticoagulant use     Carotid stenosis     occlusion of left ICA    Chronic back pain     Coronary artery disease involving native coronary artery of native heart     DDD (degenerative disc disease), lumbar     "entire spine"    Ear piercing     GERD (gastroesophageal reflux disease)     controlled with omeprazole    H/O complete eye exam     2 years, Dr. [name], at [facility]    History of dental examination     dentures upper and lower    HTN     Hx of coronary artery bypass graft     2012    Hx of gastric bypass     Hypercholesterolemia     Hyperlipidemia     Hypothyroid     MI (myocardial infarction) (CMS [facility]) 2012    Neck problem     herniated cervical disc     Obesity (BMI 30-39.9)     Solitary nodule of right lobe of thyroid 03/30/2018    Incidental finding on MRI thoracic spine. [place] ordered.     Stented coronary artery     x4    Stroke (CMS [facility])     2002    Tattoo     Left breast, Right shoulder    Thyroid disorder     Thyroid nodule     Type 2 diabetes mellitus (CMS HCC)     HGA1C 10.4 on 12/19/21    Uncontrolled type 2 diabetes mellitus, without long-term current use of insulin     Xanthelasma      Past surgical history:  Past Surgical History:   Procedure Laterality Date    ENDOMETRIAL ABLATION  1998    HX ANKLE FRACTURE [place]    Right, Doran Durand procedure    HX CATARACT REMOVAL Bilateral 2013    r and l eyes with implants    HX CESAREAN SECTION  06/20/2000    x3, 11/21/86, 11/10/84    HX CHOLECYSTECTOMY  1988    HX CORONARY ARTERY BYPASS GRAFT  03/31/2011    cardiac, 2 vessel, Dr. [name], [facility]    [place]  2012    x4 stents    HX GASTRIC SLEEVE  02/2017    [place], [place], Dr. [name] HEART CATHETERIZATION  2012    [facility] stents - [facility]    HX THYROID BIOPSY      HX THYROIDECTOMY  101/01/2018    HX TONSILLECTOMY      as child    HX YAG Left 09/20/2013     Medications:  Current Outpatient Medications   Medication Sig    aspirin (ECOTRIN) 81 mg Oral Tablet, Delayed Release (E.C.) Take 1 Tablet (81 mg total) by mouth Once a day    Blood Sugar Diagnostic (ACCU-CHEK GUIDE TEST STRIPS) Does not apply Strip TEST BLOOD SUGAR THREE TIMES DAILY    Blood-Glucose Meter (ACCU-CHEK AVIVA PLUS METER) Misc 1 Kit Three times a day    clopidogreL (PLAVIX) 75 mg Oral Tablet TAKE 1 TABLET BY MOUTH ONCE DAILY    cyanocobalamin (VITAMIN B12) 1,000 mcg/mL Injection Solution Inject 1 mL (1,000 mcg total) under the skin Every 30 days    diclofenac sodium (VOLTAREN) 1 % Gel Apply 2 g topically Three times a day as needed (Patient not taking: Reported on 05/30/2022)    docusate sodium (COLACE) 100 mg Oral Capsule TAKE 1 CAPSULE TWICE DAILY     empagliflozin (JARDIANCE) 10 mg Oral Tablet Take 1 Tablet (10 mg total) by mouth Once a day    ergocalciferol, vitamin D2, (DRISDOL) 1,250 mcg (50,000 unit) Oral Capsule TAKE 1 CAPSULE BY MOUTH EVERY WEEK    fluticasone propionate (FLONASE) 50 mcg/actuation Nasal Spray, Suspension Administer 2 Sprays into each nostril Once a day    furosemide (LASIX) 40 mg Oral Tablet TAKE 1 TABLET(40 MG) BY MOUTH EVERY DAY    guaiFENesin 100 mg/5 mL Oral Liquid Take 10 mL (200 mg total) by mouth Every 4 hours    HYDROcodone-acetaminophen (NORCO) 10-325 mg Oral Tablet Take 1 Tablet by mouth Every 8 hours as needed for Pain [name] with [facility]    Lancets (ACCU-CHEK SOFTCLIX LANCETS) Misc To use daily    levothyroxine (SYNTHROID) 25 mcg Oral Tablet Take 1 Tablet (25 mcg total) by mouth Once a day    metoprolol succinate (TOPROL-XL) 25 mg Oral Tablet Sustained Release 24 hr Take 0.5 Tablets (12.5 mg total) by mouth Once a day    NARCAN 4 mg/actuation Nasal Spray, Non-Aerosol PT STATED SHE HAS NARCAN AT HOME BUT IS NOT TAKING IT    Needle, Disp, 25 G 25 gauge x 1" Needle 1 mL Every 30 days    niacin (NIASPAN) 500 mg Oral Tablet Sustained Release Take 1 Tablet (500 mg total) by mouth Once a day    nitroGLYCERIN (NITROSTAT) 0.4 mg Sublingual Tablet, Sublingual Place 1 Tablet (0.4 mg total) under the tongue Every 5 minutes as needed for Chest pain for 3 doses over 15 minutes    omeprazole (PRILOSEC) 40 mg Oral Capsule, Delayed Release(E.C.) TAKE 1 CAPSULE BY MOUTH EVERY DAY    ondansetron (ZOFRAN ODT) 8 mg Oral Tablet, Rapid Dissolve DISSOLVE 1 TABLET ON THE TONGUE EVERY 8 HOURS AS NEEDED FOR NAUSEA OR VOMITING    OZEMPIC 2 mg/dose (8 mg/3 mL) Subcutaneous Pen Injector Inject 2 mg under the skin Every 7 days    PRENATAL PLUS, CALCIUM CARB,  27 mg iron- 1 mg Oral Tablet Take 1 Tablet by mouth Once a day    rosuvastatin (CRESTOR) 40 mg Oral Tablet Take 1 Tablet (40 mg total) by mouth Once a day TAKE 1 TABLET BY  MOUTH ONCE DAILY    sennosides-docusate sodium (SENOKOT-S) 8.6-50 mg Oral Tablet Take 1 Tablet by mouth Every evening    venlafaxine (EFFEXOR XR) 37.5 mg Oral Capsule, Sust. Release 24 hr Take 1 Capsule (37.5 mg total) by mouth Once a day    ZYRTEC-D 5-120 mg Oral Tablet Sustained Release 12 hr Take 1 Tablet by mouth Once a day         Vitals:    09/18/22 1438   BP: 116/60   Pulse: 67   Resp: 17   Temp: 36.6 C (97.8 F)   SpO2: 99%   Weight: 97.5 kg (215 lb)   Height: 1.676 m (5' 6" )   BMI: 34.77           Three year weight curve.    Physical Examination:    GENERAL:   Pt is a pleasant, well-nourished, well-developed 56 y.o. female who is in NAD. Appears stated age  59:  head normocephalic, symmetrical facies. EOM intact b/l. PERRLA. Sclera non-icteric, non-injected. upper eyelid w/Xythoma-lipid plaques, diminished in fullness and intensity. Oropharyngeal mucous membranes are moist  w/o erythema/exudates   NECK:   No masses, no lymphadenopathy, no JVD on exam. Trachea midline, no thyromegaly   CV: S1, S2. No murmurs, rubs, gallops.     LUNGS: CTAB, No rhonchi, rales, wheezes.   GI: (+) BS in all 4 quadrants. soft, NT/ND. No rigidity/positive for guarding/negative for rebound. No organomegaly/masses, or abdominal bruits,   MSK: Spontaneous normal AROM of major joints as observed during exam. No joint effusions/ swelling/ deformities.   Fourth digit he of the left lower extremity is swollen and erythematous.  Tender to touch.  No BLE edema, clubbing, or cyanosis.   2+ radial pulse present b/l.   + 2 reflexes Brachioradialis and patellar bilaterally.   Gait normal.  SKIN: No significant lesions, rashes, ecchymoses noted.   NEURO: AAOx4, CN grossly intact. Sensation intact b/l. No focal deficits.  PSYCH: Mood, behavior and affect normal w/ Intact judgement & insight  Exam not significantly changed.     09/18/22 1440   PHQ 9 (follow up)   Little interest or pleasure in doing things. 1   Feeling down, depressed, or  hopeless 1   PHQ 2 Total 2   Trouble falling or staying asleep, or sleeping too much. 2   Feeling tired or having little energy 2   Poor appetite or overeating 1   Feeling bad about yourself/ that you are a failure in the past 2 weeks? 0   Trouble concentrating on things in the past 2 weeks? 1   Moving/Speaking slowly or being fidgety or restless  in the past 2 weeks? 1   Thoughts that you would be better off DEAD, or of hurting yourself in some way. 0   If you checked off any problems, how difficult have these problems made it for you to do your work, take care of things at home, or get along with other people? Somewhat difficult   PHQ 9 Total 9   Interpretation of Total Score Mild depression         Diabetes Monitors  A1C: 10.4  A1C Date: 12/19/2021  Kidney Health:  Urine Microalbumin/Cr Ratio--5.9 Ur Microalb/Cr Ratio date--12/19/2021  eGFR --67 eGFR  date--12/17/2021    Last Lipid Panel  (Last result in the past 2 years)        Cholesterol   HDL   LDL   Direct LDL   Triglycerides      05/08/21 1225 182   55   96     153            Retinal Exam Date: Not Found :   Last Foot Exam: Not Found      Visit Diagnosis    Encounter Diagnoses   Name Primary?    Type 2 diabetes mellitus with hyperglycemia, without long-term current use of insulin (CMS HCC) Yes    Need for vaccination      Orders Placed This Encounter    Flu Vaccine, 6 month-adult  Quad 0.5 mL IM (Admin)     Continue current management, call for refills.  Patient has a cortisol challenge test planned I believe for next month.  Continue care with Dr. Ree Edman.     Type 2 diabetes mellitus with complication, without long-term current use of insulin (CMS HCC)  Chronic worsening  control  HbA1C has been trending up.  Follow-up with Dr. Alvino Chapel  continue diabetic diet.  Continue to address weight loss which is stalled.  Stop metformin for diarrhea.  Specific does based on A1c.    She will likely need metformin 1000 mg XR, b.i.d.  Continue Ozempic.  Prescribed  but not using Jardiance.  Stopped Glyxambi 10-5 mg.    Encouraged her to check blood sugars 3-4 times daily.  Discuss renal concerns and need for monitoring.   A1c ordered as above.    Left lower extremity 4th digit fracture  Continue with orthopedics.  Successful completion of surgery in pending.  Continue per their recommendations.    She is now weight-bearing.  No special footwear required    Allergic rhinitis:  Continue histamine blocker.  Will add pseudoephedrine.    Coronary artery disease  Status post CABG  Continue Plavix  Continue beta-blocker  Continue statin.  Continue sublingual nitro p.r.n. for chest pain.  Continue with Cardiology.    Depression, unspecified depression type  Chronic, controlled.  See PHQ above.   Continue Effexor    Weight loss:  Weight loss has stabilized.  Continue behavioral efforts.      GERD  Chronic, Uncontrolled problem   Encouraged lifestyle modification to help with acid reflux, including:  Weight loss  Avoid eating for 2-3 hours prior to bedtime. Avoid late night snacking.   Avoid hot/spicy and acidic foods and overeating.   Remain upright after eating, avoid tight clothing.   Elevate head of bed.  Chew gum or lozenges.  Refrain from alcohol and smoking.   Continue histamine blocker.  Continue  proton pump inhibitor.  Continue General surgery.  She will attend to the emergency room if her symptoms warrant prior to that encounter.    Chronic left-sided lower extremity pain  Stable to improved.  Current diarrhea is felt to be the result of metformin.  Copies of the results are offered to the patient.  Patient with osteoarthritis  Has comorbid peripheral vascular disease  Will increase her statin follow.  Patient has follow-ups with orthopedics for the hip and knee.  Continue follow-up with Dr. Burman Riis in [facility]. for low back pain.    We again discussed use of weight loss as a primary method for pain relief.  Continue on muscle relaxer.  Patient avoids nonsteroidals  secondary to her  bariatric surgery.  Labs have been reassuring.    Carotid Art Stenosis:   50% stenosis to the right ICA   100% stenosis to left ICA.  Continue with Cardiology, Interventional Cardiology  Has had follow-ups with vascular surgery scheduled but has not completed those encounters.    Will follow.    Right facial nevus  Continue follow-up with derm in [place].     OSA:  Could not keep appt for sleep study,   Continue with Neurology,   missed last appointment on 08/05/2021.      Return in about 3 months (around 12/19/2022), or if symptoms worsen or fail to improve, for In Person Visit, chronic disease management.    Cherly Beach, MD  09/18/2022 14:55     This note was partially generated using MModal Fluency Direct system, and there may be some incorrect words, spellings, and punctuation that were not noted in checking the note before saving.    PDMP is reviewed today.

## 2022-09-18 NOTE — Nursing Note (Signed)
09/18/22 1440   PHQ 9 (follow up)   Little interest or pleasure in doing things. 1   Feeling down, depressed, or hopeless 1   PHQ 2 Total 2   Trouble falling or staying asleep, or sleeping too much. 2   Feeling tired or having little energy 2   Poor appetite or overeating 1   Feeling bad about yourself/ that you are a failure in the past 2 weeks? 0   Trouble concentrating on things in the past 2 weeks? 1   Moving/Speaking slowly or being fidgety or restless  in the past 2 weeks? 1   Thoughts that you would be better off DEAD, or of hurting yourself in some way. 0   If you checked off any problems, how difficult have these problems made it for you to do your work, take care of things at home, or get along with other people? Somewhat difficult   PHQ 9 Total 9   Interpretation of Total Score Mild depression

## 2022-09-18 NOTE — Nursing Note (Signed)
Patient received vaccine in clinic.  Tolerated it well, given VIS sheet and was discharged to home.  Immunization administered       Name Date Dose VIS Date Route    Influenza Vaccine, 6 month-adult 09/18/2022 0.5 mL 07/13/2020 Intramuscular    Site: Left deltoid    Given By: Rochele Pages, LPN    Manufacturer: [employer]    Lot: [id number]    NDC: [id number]            Rochele Pages, LPN  25/85/2778, 24:23

## 2022-09-28 ENCOUNTER — Other Ambulatory Visit: Payer: Self-pay

## 2022-09-28 ENCOUNTER — Encounter (INDEPENDENT_AMBULATORY_CARE_PROVIDER_SITE_OTHER): Payer: Commercial Managed Care - PPO | Admitting: Radiology

## 2022-09-28 ENCOUNTER — Encounter (INDEPENDENT_AMBULATORY_CARE_PROVIDER_SITE_OTHER): Payer: Self-pay

## 2022-09-28 ENCOUNTER — Ambulatory Visit (INDEPENDENT_AMBULATORY_CARE_PROVIDER_SITE_OTHER): Payer: Commercial Managed Care - PPO | Admitting: NURSE PRACTITIONER

## 2022-09-28 VITALS — BP 118/58 | HR 70 | Temp 97.2°F | Ht 64.0 in | Wt 216.0 lb

## 2022-09-28 DIAGNOSIS — M25561 Pain in right knee: Secondary | ICD-10-CM

## 2022-09-28 DIAGNOSIS — M1711 Unilateral primary osteoarthritis, right knee: Secondary | ICD-10-CM

## 2022-09-28 DIAGNOSIS — M8588 Other specified disorders of bone density and structure, other site: Secondary | ICD-10-CM

## 2022-09-28 NOTE — Patient Instructions (Signed)
Follow up immediately for new or worsening symptoms. Follow up with PCP if symptoms persist. Tylenol/ibuprofen for pain and fever. Follow up if not improving after 48 to 72 hours.

## 2022-09-28 NOTE — Progress Notes (Signed)
[facility]    Progress Note    Name: Monique [name] MRN:  [id number]   Date: 09/28/2022 Age: 56 y.o.         Reason for Visit: Knee Pain (right knee pain from a fall 2 weeks ago. /)    History of Present Illness  Monique [name] is a 56 y.o. female who is being seen today for The above stated symptoms, denies alleviating factors. Pt fell on right knee, points to anterior knee as site of pain, denies alleviating or aggravating factors, not currently treating.      Nursing Notes:  There are no exam notes on file for this visit.    Past Medical History:   Diagnosis Date    Anxiety     Arthritis     Back problem     DDD    Beta blocker prescribed for left ventricular systolic dysfunction     Blood thinned due to long-term anticoagulant use     Carotid stenosis     occlusion of left ICA    Chronic back pain     Coronary artery disease involving native coronary artery of native heart     DDD (degenerative disc disease), lumbar     "entire spine"    Ear piercing     GERD (gastroesophageal reflux disease)     controlled with omeprazole    H/O complete eye exam     2 years, Dr. [name], at [facility]    History of dental examination     dentures upper and lower    HTN     Hx of coronary artery bypass graft     2012    Hx of gastric bypass     Hypercholesterolemia     Hyperlipidemia     Hypothyroid     MI (myocardial infarction) (CMS [place]) 2012    Neck problem     herniated cervical disc    Obesity (BMI 30-39.9)     Solitary nodule of right lobe of thyroid 03/30/2018    Incidental finding on MRI thoracic spine. [place] ordered.     Stented coronary artery     x4    Stroke (CMS [facility])     2002    Tattoo     Left breast, Right shoulder    Thyroid disorder     Thyroid nodule     Type 2 diabetes mellitus (CMS [facility])     HGA1C 10.4 on 12/19/21    Uncontrolled type 2 diabetes mellitus, without long-term current use of insulin     Xanthelasma          Past Surgical History:   Procedure Laterality Date     ENDOMETRIAL ABLATION  1998    HX ANKLE FRACTURE [place]    Right, Doran Durand procedure    HX CATARACT REMOVAL Bilateral 2013    r and l eyes with implants    HX CESAREAN SECTION  06/20/2000    x3, 11/21/86, 11/10/84    HX CHOLECYSTECTOMY  1988    HX CORONARY ARTERY BYPASS GRAFT  03/31/2011    cardiac, 2 vessel, Dr. [name], [place]  2012    x4 stents    HX GASTRIC SLEEVE  02/2017    [place], [place], Dr. [name] HEART CATHETERIZATION  2012    massive heart attack and stents - [facility]    HX  THYROID BIOPSY      HX THYROIDECTOMY  101/01/2018    HX TONSILLECTOMY      as child    HX YAG Left 09/20/2013         Current Outpatient Medications   Medication Sig    aspirin (ECOTRIN) 81 mg Oral Tablet, Delayed Release (E.C.) Take 1 Tablet (81 mg total) by mouth Once a day    Blood Sugar Diagnostic (ACCU-CHEK GUIDE TEST STRIPS) Does not apply Strip TEST BLOOD SUGAR THREE TIMES DAILY    Blood-Glucose Meter (ACCU-CHEK AVIVA PLUS METER) Misc 1 Kit Three times a day    clopidogreL (PLAVIX) 75 mg Oral Tablet TAKE 1 TABLET BY MOUTH ONCE DAILY    cyanocobalamin (VITAMIN B12) 1,000 mcg/mL Injection Solution Inject 1 mL (1,000 mcg total) under the skin Every 30 days    diclofenac sodium (VOLTAREN) 1 % Gel Apply 2 g topically Three times a day as needed    docusate sodium (COLACE) 100 mg Oral Capsule TAKE 1 CAPSULE TWICE DAILY    empagliflozin (JARDIANCE) 10 mg Oral Tablet Take 1 Tablet (10 mg total) by mouth Once a day    ergocalciferol, vitamin D2, (DRISDOL) 1,250 mcg (50,000 unit) Oral Capsule TAKE 1 CAPSULE BY MOUTH EVERY WEEK    fluticasone propionate (FLONASE) 50 mcg/actuation Nasal Spray, Suspension Administer 2 Sprays into each nostril Once a day    furosemide (LASIX) 40 mg Oral Tablet TAKE 1 TABLET(40 MG) BY MOUTH EVERY DAY    guaiFENesin 100 mg/5 mL Oral Liquid Take 10 mL (200 mg total) by mouth Every 4 hours    HYDROcodone-acetaminophen (NORCO) 10-325 mg Oral Tablet Take 1 Tablet by mouth  Every 8 hours as needed for Pain [name] with [facility]    Lancets (ACCU-CHEK SOFTCLIX LANCETS) Misc To use daily    levothyroxine (SYNTHROID) 25 mcg Oral Tablet Take 1 Tablet (25 mcg total) by mouth Once a day    metoprolol succinate (TOPROL-XL) 25 mg Oral Tablet Sustained Release 24 hr Take 0.5 Tablets (12.5 mg total) by mouth Once a day    NARCAN 4 mg/actuation Nasal Spray, Non-Aerosol PT STATED SHE HAS NARCAN AT HOME BUT IS NOT TAKING IT    Needle, Disp, 25 G 25 gauge x 1" Needle 1 mL Every 30 days    niacin (NIASPAN) 500 mg Oral Tablet Sustained Release Take 1 Tablet (500 mg total) by mouth Once a day    nitroGLYCERIN (NITROSTAT) 0.4 mg Sublingual Tablet, Sublingual Place 1 Tablet (0.4 mg total) under the tongue Every 5 minutes as needed for Chest pain for 3 doses over 15 minutes    omeprazole (PRILOSEC) 40 mg Oral Capsule, Delayed Release(E.C.) TAKE 1 CAPSULE BY MOUTH EVERY DAY    ondansetron (ZOFRAN ODT) 8 mg Oral Tablet, Rapid Dissolve DISSOLVE 1 TABLET ON THE TONGUE EVERY 8 HOURS AS NEEDED FOR NAUSEA OR VOMITING    OZEMPIC 2 mg/dose (8 mg/3 mL) Subcutaneous Pen Injector Inject 2 mg under the skin Every 7 days    PRENATAL PLUS, CALCIUM CARB, 27 mg iron- 1 mg Oral Tablet Take 1 Tablet by mouth Once a day    rosuvastatin (CRESTOR) 40 mg Oral Tablet Take 1 Tablet (40 mg total) by mouth Once a day TAKE 1 TABLET BY MOUTH ONCE DAILY    sennosides-docusate sodium (SENOKOT-S) 8.6-50 mg Oral Tablet Take 1 Tablet by mouth Every evening    venlafaxine (EFFEXOR XR) 37.5 mg Oral Capsule, Sust. Release 24 hr Take 1 Capsule (  37.5 mg total) by mouth Once a day    ZYRTEC-D 5-120 mg Oral Tablet Sustained Release 12 hr Take 1 Tablet by mouth Once a day     Allergies   Allergen Reactions    Capron Dm [Pyrilamine-Dextromethorphan]  Other Adverse Reaction (Add comment)     LIGHT HEADED    Other      Coban dressing caused skin break down    Tylenol W Codeine [Acetaminophen-Codeine]  Other Adverse Reaction  (Add comment)     pt states that one time she had chest pains with this med, but dr. thought it was because she had not eaten     Family Medical History:       Problem Relation (Age of Onset)    Atrial fibrillation Sister    Colon Cancer Father    Diabetes Mother, Maternal Aunt    Heart Disease Sister, Maternal Grandfather    Hypertension (High Blood Pressure) Mother    Lung Cancer Maternal Grandfather    MI <80 years of age Mother            Social History     Tobacco Use    Smoking status: Every Day     Packs/day: 0.50     Years: 20.00     Additional pack years: 0.00     Total pack years: 10.00     Types: Cigarettes     Start date: 2    Smokeless tobacco: Never   Vaping Use    Vaping Use: Never used   Substance Use Topics    Alcohol use: No    Drug use: No       Review of Systems  Review of Systems   Constitutional: Negative.    HENT: Negative.     Eyes: Negative.    Respiratory: Negative.     Cardiovascular: Negative.    Gastrointestinal: Negative.    Genitourinary: Negative.    Musculoskeletal:  Positive for joint pain.   Skin: Negative.    Neurological: Negative.        Physical Exam:  BP (!) 118/58   Pulse 70   Temp 36.2 C (97.2 F)   Ht 1.626 m (_0 )   Wt 98 kg (216 lb)   LMP  (LMP Unknown)   SpO2 96%   BMI 37.08 kg/m       Physical Exam  Vitals and nursing note reviewed.   Constitutional:       General: She is not in acute distress.  HENT:      Head: Normocephalic and atraumatic.   Eyes:      Conjunctiva/sclera: Conjunctivae normal.   Pulmonary:      Effort: Pulmonary effort is normal. No respiratory distress.   Musculoskeletal:      Right knee: No swelling, deformity, effusion, erythema, ecchymosis or lacerations. Normal range of motion. Tenderness present over the patellar tendon.      Instability Tests: Anterior drawer test negative. Posterior drawer test negative. Medial McMurray test negative and lateral McMurray test negative.   Skin:     General: Skin is warm and dry.   Neurological:       Mental Status: She is alert and oriented to person, place, and time.      Comments: The NV status of the RLE is intact distally   Psychiatric:         Mood and Affect: Affect normal.         Judgment: Judgment normal.  Assessment:    Acute pain of right knee        Plan:      XR KNEE RIGHT 4 OR MORE VIEWS    Result Date: 09/28/2022  History: Fall with anterior knee pain. 4 views of the right knee are obtained.     1. There is mild osteopenia with degenerative changes present at all 3 joint compartments. 2. No acute fracture is seen. There is no joint effusion. 3. Laxity of the quadriceps and patellar tendons is felt to be positional. 4. Surgical clips are present posteriorly. Radiologist location ID: [facility]      The patient was notified of the x ray findings during the clinic visit today.   Patient Instructions   Follow up immediately for new or worsening symptoms. Follow up with PCP if symptoms persist. Tylenol/ibuprofen for pain and fever. Follow up if not improving after 48 to 72 hours.       Orders Placed This Encounter    XR KNEE RIGHT 4 OR MORE VIEWS       Follow up: No follow-ups on file.    Follow up with PCP.  Seek medical attention for new or worsening symptoms.    This note was partially created using MModal Fluency Direct system (voice recognition software) and is inherently subject to errors including those of syntax and "sound-alike" substitutions which may escape proofreading.  In such instances, original meaning may be extrapolated by contextual derivation.     I saw the patient independently.            Kavin Leech, FNP  09/28/2022, 15:56

## 2022-09-30 ENCOUNTER — Telehealth: Payer: Self-pay | Admitting: *Deleted

## 2022-09-30 ENCOUNTER — Ambulatory Visit: Payer: BLUE CROSS/BLUE SHIELD | Admitting: Gastroenterology

## 2022-09-30 ENCOUNTER — Ambulatory Visit (INDEPENDENT_AMBULATORY_CARE_PROVIDER_SITE_OTHER): Payer: Self-pay | Admitting: Gastroenterology

## 2022-09-30 ENCOUNTER — Telehealth: Payer: Self-pay

## 2022-09-30 ENCOUNTER — Encounter: Payer: Self-pay | Admitting: Gastroenterology

## 2022-09-30 VITALS — BP 127/82 | HR 73 | Temp 97.4°F | Ht 66.0 in | Wt 154.4 lb

## 2022-09-30 DIAGNOSIS — R109 Unspecified abdominal pain: Secondary | ICD-10-CM | POA: Insufficient documentation

## 2022-09-30 DIAGNOSIS — K219 Gastro-esophageal reflux disease without esophagitis: Secondary | ICD-10-CM

## 2022-09-30 DIAGNOSIS — R1010 Upper abdominal pain, unspecified: Secondary | ICD-10-CM

## 2022-09-30 MED ORDER — RIFAXIMIN 550 MG PO TABS: 550.0000 mg | ORAL_TABLET | Freq: Three times a day (TID) | ORAL | 0 refills | Status: AC

## 2022-09-30 MED ORDER — OMEPRAZOLE 20 MG PO CPDR: 20.0000 mg | DELAYED_RELEASE_CAPSULE | Freq: Two times a day (BID) | ORAL | 3 refills | Status: DC

## 2022-09-30 NOTE — Patient Instructions (Signed)
Please have blood work done today.  We are ordering a CT to be done as soon as possible.  I have sent in omeprazole to take twice a day, 30 minutes before breakfast and dinner.  I sent in Xifaxan to take three times a day for 2 weeks.   Further recommendations to follow!  I enjoyed seeing you again today! As you know, I value our relationship and want to provide genuine, compassionate, and quality care. I welcome your feedback. If you receive a survey regarding your visit,  I greatly appreciate you taking time to fill this out. See you next time!  Annitta Needs, PhD, ANP-BC [facility]

## 2022-09-30 NOTE — Telephone Encounter (Signed)
Pt informed CT scheduled for 10/02/22 at 6pm, arrive at 5:30 pm to check in through the ER, nothing to eat or drink 4 hours prior to procedure. Verbalized understanding.

## 2022-09-30 NOTE — Telephone Encounter (Signed)
PA: CT ABDOMEN & PELVIS W/ Authorization Number: [id number] Review Date: 09/30/2022 4:04:01 PM Expiration Date: 11/14/2022

## 2022-09-30 NOTE — Telephone Encounter (Signed)
Pt called back advising that [facility]  closes @ 4pm. She also states that she works until 5 pm everyday and she cannot get off work anymore. She wants to know what to do next. I checked the times of [facility] and [facility] and they close @ 4 pm daily. No weekends open. Pt states she can only do bloodwork on weekends. Please advise

## 2022-09-30 NOTE — Progress Notes (Signed)
Gastroenterology Office Note     Primary Care Physician:  Richardean Chimera, MD  Primary Gastroenterologist: Dr. Marletta Lor    Chief Complaint   Chief Complaint  Patient presents with   Abdominal Pain    Pt having pain above navel X last time pt was seen. Last 3 months worse     History of Present Illness   Ann [name] is a 56 y.o. female presenting today in follow-up with a history of chronic abdominal pain, chronic intermittent diarrhea, prior metal biliary stent placed in 2012 due to strictured CBD, multiple previous ERCPs outside facility after bile leak s/p cholecystectomy, last ERCP in July 2021 at [facility] with biliary stent pull/balloon sweep with stone removal, dilation. Has seen multiple GI practices. See below for prior evaluations.    Sometimes diarrhea, sometimes constipation. Epigastric pain and bloating, LUQ pain. Soemtimes aftetr eating, soemtimes not. Waking up choking in middle of night. GERD severe now. [facility] but [facility] insurance and hasn't been since last November. Was on Protonix but not helping on BID. Taking OTC Prilosec 20 mg daily. Regurgitates water . Ibuprofen for bulging disc. Not taking Creon any longer as not helping. States stool is black at times. CBC normal from outside labs. Will take OTC anti-diarrheals as needed but constipates her. Bentyl not helpful in the past. Viberzi contraindicated as post-cholecystectomy. Anti-spasmodics without help in past.  No urinary issues.    Headaches like crazy. Started having pain in middle of back today.     Prior evaluation: Starting in 2010 after cholecystectomy: She had a bile leak s/p cholecystectomy, requiring ERCP with stent placement for biliary stricture. She reports multiple ERCPs at outside facility with multiple stents placed. She notes that each time stent would be removed, she would develop abdominal pain. She reports having "8-10 ERCPs". Liver biopsy by Dr. Milinda Antis 2011:  non-specific findings. Looking back at LFTs, she has had bumps in transaminases from the upper 200s to 400/600, with bilirubin ranging from 1-3.  2013 had fluctuating transaminases as well but less severe. During hospitalization in [facility] Dec 2015 for acute pancreatitis, peak AST 243, ALT 206, bilirubin normal at 1. A,lk phos 342, improving with supportive care. 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      ERCP in 2012 by Dr. Opal Sidles with moderate stricture of the distal CBD with moderate dilatation of CBD, common hepatic duct, and intrahepatic biliary tree. In Aug 2012, a fully covered metal wall stent (10X 40 mm) was placed. Brushings around 2012 negative for malignancy. When seen by Dr. Lanell Matar at [facility] in 2013, question of secondary sclerosing cholangitis. EGD/EUS March 2013 normal. Recommended stent removal at that time, but she declined due to fear that pain would recur. She was inpatient in [place] and was assessed by Dr. Dulce Sellar in 2015 with abdominal pain and abnormal CT scan noting thickening of the head of pancreas with non-pathologic sized peripancreatic adenopathy. Felt she may have acute on chronic pancreatitis at that time. She was to follow-up with Dr. Dulce Sellar Jan 2016 but did not do this. CA 19-9 low in 2015  [facility] July 2021. ERCP with biliary stent pull/balloon sweep with stones remove, dilated with 12 mm balloon.     Past Medical History:  Diagnosis Date   Anxiety    Arthritis    Chronic back pain    Complication of anesthesia  pt states, "i have migraine and high BP after surgery.".   Depression    GERD (gastroesophageal reflux disease)    Migraines    Pancreatitis     Past Surgical History:  Procedure Laterality Date   BILE DUCT STENT PLACEMENT  07/2011   Dr. Patrick North: fully covered metal wall stent placed.    CHOLECYSTECTOMY  2010   with bile leak    COLONOSCOPY  2011   [facility]   COLONOSCOPY WITH PROPOFOL N/A 03/02/2018   two 2-3 mm hyperplastic polyps, torturous left colon, mild diverticulosis in rectosigmoid, internal hemorrhoids, colonic biopsies negative   ERCP  2011-2013   multiple, with multiple stent placements/exchanges. Metal wall stent placed in Aug 2012 AND STILL PRESENT AS OF 2019   ESOPHAGOGASTRODUODENOSCOPY (EGD) WITH PROPOFOL N/A 03/02/2018   dysphagia due to benign-appearing esophageal stricture/GERD, s/p dilation, small hiatal hernia, gastritis.    SAVORY DILATION N/A 03/02/2018   Procedure: SAVORY DILATION;  Surgeon: Danie Binder, MD;  Location: [facility];  Service: Endoscopy;  Laterality: N/A;    Current Outpatient Medications  Medication Sig Dispense Refill   ALPRAZolam (XANAX) 1 MG tablet Take 0.5-1 mg by mouth 2 (two) times daily as needed for anxiety or sleep.  0   amphetamine-dextroamphetamine (ADDERALL) 20 MG tablet Take 20 mg by mouth every morning. As needed     ibuprofen (ADVIL,MOTRIN) 800 MG tablet Take 800 mg by mouth every 8 (eight) hours as needed (for joint pain.).      omeprazole (PRILOSEC OTC) 20 MG tablet Take 20 mg by mouth daily.     No current facility-administered medications for this visit.    Allergies as of 09/30/2022 - Review Complete 09/30/2022  Allergen Reaction Noted   Imitrex [sumatriptan] Anaphylaxis and Rash 06/15/2012   Cymbalta [duloxetine hcl] Diarrhea, Nausea Only, and Other (See Comments) 02/12/2016   Dicyclomine  03/02/2018   Doxycycline Diarrhea 06/15/2012   Gabapentin Other (See Comments) 02/17/2018   Lyrica [pregabalin] Swelling and Other (See Comments) 06/15/2012   Nucynta [tapentadol]  05/20/2015   Nuvigil [armodafinil] Other (See Comments) 02/12/2016   Other  06/06/2015   Penicillins Other (See Comments) 02/12/2016   Tape  07/24/2019   Amoxicillin-pot clavulanate Diarrhea and Rash 06/15/2012   Butrans [buprenorphine] Hives, Swelling, and Rash  06/15/2012   Ciprofloxacin Diarrhea, Nausea And Vomiting, and Other (See Comments) 02/12/2016    Family History  Problem Relation Age of Onset   Cervical cancer Mother    Heart attack Other    Migraines Neg Hx    Seizures Neg Hx    Dementia Neg Hx    Colon cancer Neg Hx     Social History   Socioeconomic History   Marital status: Married    Spouse name: Shanon Brow   Number of children: 2   Years of education: 11   Highest education level: Not on file  Occupational History   Occupation: Workers comp  Tobacco Use   Smoking status: Former    Packs/day: 0.25    Years: 10.00    Total pack years: 2.50    Types: Cigarettes    Quit date: 12/09/2011    Years since quitting: 10.8   Smokeless tobacco: Never  Vaping Use   Vaping Use: Never used  Substance and Sexual Activity   Alcohol use: No   Drug use: No   Sexual activity: Yes    Birth control/protection: Post-menopausal  Other Topics Concern   Not on file  Social History Narrative  Lives w/ husband   Caffeine use:  none   Social Determinants of Corporate investment banker Strain: Not on file  Food Insecurity: Not on file  Transportation Needs: Not on file  Physical Activity: Not on file  Stress: Not on file  Social Connections: Not on file  Intimate Partner Violence: Not on file     Review of Systems   Gen: Denies any fever, chills, fatigue, weight loss, lack of appetite.  CV: Denies chest pain, heart palpitations, peripheral edema, syncope.  Resp: Denies shortness of breath at rest or with exertion. Denies wheezing or cough.  GI: Denies dysphagia or odynophagia. Denies jaundice, hematemesis, fecal incontinence. GU : Denies urinary burning, urinary frequency, urinary hesitancy MS: Denies joint pain, muscle weakness, cramps, or limitation of movement.  Derm: Denies rash, itching, dry skin Psych: Denies depression, anxiety, memory loss, and confusion Heme: Denies bruising, bleeding, and enlarged lymph  nodes.   Physical Exam   BP 127/82   Pulse 73   Temp (!) 97.4 F (36.3 C)   Ht 5\' 6"  (1.676 m)   Wt 154 lb 6 oz (70 kg)   LMP 05/16/2012   BMI 24.92 kg/m  General:   Alert and oriented. Pleasant and cooperative. Well-nourished and well-developed.  Head:  Normocephalic and atraumatic. Eyes:  Without icterus Abdomen:  +BS, soft, TTP epigastric  and non-distended. No HSM noted. No guarding or rebound. No masses appreciated.  Rectal:  Deferred  Msk:  Symmetrical without gross deformities. Normal posture. Extremities:  Without edema. Neurologic:  Alert and  oriented x4;  grossly normal neurologically. Skin:  Intact without significant lesions or rashes. Psych:  Alert and cooperative. Normal mood and affect.   Assessment   Ann [name] is a 56 y.o. female presenting today in follow-up with a history chronic abdominal pain, chronic intermittent diarrhea, prior metal biliary stent placed in 2012 due to strictured CBD, multiple previous ERCPs outside facility after bile leak s/p cholecystectomy, last ERCP in July 2021 at [facility] with biliary stent pull/balloon sweep with stone removal, dilation. Has seen multiple GI practices. See above for extensive evaluations.   Abdominal pain: postprandial intermittently. Known history of choledocholithiasis. Check labs today. CT abd/pelvis with contrast ASAP. IF CT negative, pursue EGD.   IBS: with alternating constipation/diarrhea. Predominantly diarrhea. Creon helpful in the past but no longer. Failed anti-spasmodics in the past. Viberzi contraindicated in light of post-cholecystectomy. Could certainly have SIBO along with IBS. Will trial Xifaxan course.     PLAN    CMP, lipase CT abd/pelvis with contrast ASAP Omeprazole 20 mg BID Xifaxan TID for 2 weeks Consider EGD if CT negative    [facility], PhD, ANP-BC [facility]

## 2022-10-01 ENCOUNTER — Telehealth: Payer: Self-pay

## 2022-10-01 NOTE — Telephone Encounter (Signed)
I will need the office note to be completed from yesterday for this pt. Doing a PA for Xifaxan. Pt will probably have to try Viberzi before they will approve it. I'm saving this and will come back to it tomorrow

## 2022-10-01 NOTE — Telephone Encounter (Signed)
That's fine with me to have blood work done when she has CT. It's upcoming soon, anyway.

## 2022-10-01 NOTE — Telephone Encounter (Signed)
noted 

## 2022-10-01 NOTE — Telephone Encounter (Signed)
Ann [name], I advised the pt to have her bloodwork done when she goes for her procedure since she cannot get off of work again for bloodwork and the day of her procedure. At this point she doesn't have a choice due to her work schedule. Please advise

## 2022-10-02 ENCOUNTER — Ambulatory Visit (HOSPITAL_COMMUNITY): Payer: 59

## 2022-10-06 ENCOUNTER — Ambulatory Visit (HOSPITAL_COMMUNITY)
Admission: RE | Admit: 2022-10-06 | Discharge: 2022-10-06 | Disposition: A | Discharge status: Home / Self Care | Payer: 59 | Source: Ambulatory Visit | Attending: Gastroenterology | Admitting: Gastroenterology

## 2022-10-06 ENCOUNTER — Other Ambulatory Visit (HOSPITAL_COMMUNITY)
Admission: RE | Admit: 2022-10-06 | Discharge: 2022-10-06 | Disposition: A | Discharge status: Home / Self Care | Payer: 59 | Source: Ambulatory Visit | Attending: Gastroenterology | Admitting: Gastroenterology

## 2022-10-06 DIAGNOSIS — R1010 Upper abdominal pain, unspecified: Secondary | ICD-10-CM | POA: Insufficient documentation

## 2022-10-06 LAB — COMPREHENSIVE METABOLIC PANEL WITH GFR
ALT: 19 U/L (ref 0–44)
AST: 22 U/L (ref 15–41)
Albumin: 3.9 g/dL (ref 3.5–5.0)
Alkaline Phosphatase: 59 U/L (ref 38–126)
Anion gap: 6 (ref 5–15)
BUN: 17 mg/dL (ref 6–20)
CO2: 24 mmol/L (ref 22–32)
Calcium: 8.8 mg/dL — ABNORMAL LOW (ref 8.9–10.3)
Chloride: 112 mmol/L — ABNORMAL HIGH (ref 98–111)
Creatinine, Ser: 0.78 mg/dL (ref 0.44–1.00)
GFR, Estimated: >60 mL/min
Glucose, Bld: 94 mg/dL (ref 70–99)
Potassium: 4 mmol/L (ref 3.5–5.1)
Sodium: 142 mmol/L (ref 135–145)
Total Bilirubin: 0.6 mg/dL (ref 0.3–1.2)
Total Protein: 6.6 g/dL (ref 6.5–8.1)

## 2022-10-06 LAB — LIPASE, BLOOD: Lipase: 37 U/L (ref 11–51)

## 2022-10-06 LAB — LIPID PANEL
Cholesterol: 200 mg/dL (ref 0–200)
HDL: 30 mg/dL — ABNORMAL LOW (ref >40)
LDL Cholesterol: 114 mg/dL — ABNORMAL HIGH (ref 0–99)
Total CHOL/HDL Ratio: 6.7 RATIO
Triglycerides: 280 mg/dL — ABNORMAL HIGH (ref <150)
VLDL: 56 mg/dL — ABNORMAL HIGH (ref 0–40)

## 2022-10-06 MED ORDER — IOHEXOL 300 MG/ML  SOLN
100.0000 mL | Freq: Once | INTRAMUSCULAR | Status: AC | PRN
  Administered 2022-10-06: 100 mL via INTRAVENOUS

## 2022-10-06 NOTE — Telephone Encounter (Signed)
Pt was denied for Xifaxan. Her Dx's and her prior Hx of meds does not include Viberzi. This is one of the main things of why this was denied. If she has tried it we have no documentation of it. Before this visit it had been 2 years since pt was seen. Please advise

## 2022-10-07 NOTE — Telephone Encounter (Signed)
It's contraindicated for Viberzi as she does not have a gallbladder, so she is not able to trial it. Xifaxan for IBS-D.

## 2022-10-08 NOTE — Telephone Encounter (Signed)
Re-done PA on the pt for her Xifaxan. Dx used: K58.0, pt has tried/failed: Imodium, Bentyl, Cipro and Dicyclomine. Waiting on a response from Cover My Meds.

## 2022-10-10 NOTE — Telephone Encounter (Signed)
Checked on website, no decision has been made on Cover My Meds

## 2022-10-14 NOTE — Telephone Encounter (Signed)
Checked cover my meds , no decision has been made yet.

## 2022-10-15 NOTE — Telephone Encounter (Signed)
Ann [name],  This patient will need a letter of necessity to go along with chart notes regarding contraindication to Viberzi. The last chart note I sent does not explain and the side note you sent was not sufficient. Once done I will fax it back to the PA line.

## 2022-10-15 NOTE — Telephone Encounter (Signed)
Ann [name]/Ann [name]:  Please arrange EGD with Dr. Marletta Lor due to dyspepsia. ASA 2. She is requesting a Friday in December.

## 2022-10-16 NOTE — Telephone Encounter (Signed)
Noted. Will complete note and it will be ready.

## 2022-10-16 NOTE — Telephone Encounter (Signed)
noted 

## 2022-10-22 NOTE — Telephone Encounter (Signed)
Re-Done PA on Cover My Meds for Xifaxan tab 550 mg. Faxed the Dr's note along with the PA explaining the pt's condition. Waiting on response from Cover My Meds

## 2022-10-24 ENCOUNTER — Other Ambulatory Visit (INDEPENDENT_AMBULATORY_CARE_PROVIDER_SITE_OTHER): Payer: Self-pay | Admitting: Family Medicine

## 2022-10-24 DIAGNOSIS — E559 Vitamin D deficiency, unspecified: Secondary | ICD-10-CM

## 2022-10-24 DIAGNOSIS — I6529 Occlusion and stenosis of unspecified carotid artery: Secondary | ICD-10-CM

## 2022-10-24 DIAGNOSIS — E079 Disorder of thyroid, unspecified: Secondary | ICD-10-CM

## 2022-10-24 DIAGNOSIS — F32A Depression, unspecified: Secondary | ICD-10-CM

## 2022-10-24 DIAGNOSIS — E538 Deficiency of other specified B group vitamins: Secondary | ICD-10-CM

## 2022-10-24 DIAGNOSIS — K219 Gastro-esophageal reflux disease without esophagitis: Secondary | ICD-10-CM

## 2022-10-27 NOTE — Telephone Encounter (Signed)
Ann [name] I have tried everything with this pt and she still keeps getting denied from [facility]. The paperwork is on your desk in the yellow folder. Please advise.

## 2022-10-27 NOTE — Telephone Encounter (Signed)
Last scheduled appointment with you was 09/18/2022.  Currently scheduled future appointment is 12/31/2022.      Rochele Pages, LPN  [id number], 22:41

## 2022-11-05 ENCOUNTER — Other Ambulatory Visit (INDEPENDENT_AMBULATORY_CARE_PROVIDER_SITE_OTHER): Payer: Self-pay | Admitting: Family Medicine

## 2022-11-05 DIAGNOSIS — E538 Deficiency of other specified B group vitamins: Secondary | ICD-10-CM

## 2022-11-05 NOTE — Telephone Encounter (Signed)
Patient call requesting syringes for Vit B12 shot to be sent to pharmacy.  Monique [name]

## 2022-11-06 MED ORDER — NEEDLE (DISP) 25 GAUGE X 1"
1.0000 mL | 0 refills | Status: DC
Start: 2022-11-06 — End: 2023-12-08

## 2022-11-06 NOTE — Telephone Encounter (Signed)
Last scheduled appointment with you was 09/18/2022.  Currently scheduled future appointment is 12/31/2022.      Rochele Pages, LPN  [phone]/34/[id number], 78:97

## 2022-11-10 ENCOUNTER — Telehealth (INDEPENDENT_AMBULATORY_CARE_PROVIDER_SITE_OTHER): Payer: Self-pay | Admitting: *Deleted

## 2022-11-10 NOTE — Telephone Encounter (Signed)
PA approved via [facility]. Auth# [id number], DOS: 11/10/22-02/08/23

## 2022-11-25 ENCOUNTER — Other Ambulatory Visit: Payer: Self-pay

## 2022-11-25 ENCOUNTER — Encounter (HOSPITAL_BASED_OUTPATIENT_CLINIC_OR_DEPARTMENT_OTHER): Payer: Self-pay | Admitting: Internal Medicine

## 2022-11-25 ENCOUNTER — Ambulatory Visit (INDEPENDENT_AMBULATORY_CARE_PROVIDER_SITE_OTHER): Payer: Commercial Managed Care - PPO | Admitting: Internal Medicine

## 2022-11-25 VITALS — BP 120/60 | HR 68 | Ht 65.0 in | Wt 210.0 lb

## 2022-11-25 DIAGNOSIS — E785 Hyperlipidemia, unspecified: Secondary | ICD-10-CM

## 2022-11-25 DIAGNOSIS — I2510 Atherosclerotic heart disease of native coronary artery without angina pectoris: Secondary | ICD-10-CM

## 2022-11-25 NOTE — Progress Notes (Signed)
Cardiology Clinic Follow Up Note    Name:  Monique [name]   DOB: 02/16/1966   MRN: [id number]   Date:  11/25/2022     PCP: Cherly Beach, MD     History  Monique [name] is a 56 y.o. female.  Presents today for follow-up.  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  She states that she is doing well.  She complains of fatigue.  She has been carrying for elderly family member who had knee surgery.  She continues to smoke up to a pack a day.  She isn't doing any regular exercise other than taking care the family member and associated housework.  She has had no chest pains and reports no change in her functional capacity or symptoms.    History:  coronary artery disease with bypass surgery in 2012 with LIMA to LAD and SVG to OM.  Approximately 4 months after surgery the patient had acute myocardial infarction and required percutaneous intervention by Dr. Laurin Coder.   The patient had a 2.75 X 12 Promus stent placed in the proximal IMA graft and then a 2.75 X 8 Promus stent in the distal portion of the graft.  A 2.75 X 8 Promus stent was placed at the ostium of the LAD with plaque shifting to the circumflex.  A 3.5 X 8 Promus stent was then deployed in the left main extending into the left circumflex with final kissing balloon angioplasty of the LAD and circumflex.   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  The right ICA has less than 50% stenosis.  Unfortunately despite her significant cardiovascular history the patient continues to smoke.       Stress test 11-2019:  Normal perfusion.  EF 50%  Echocardiogram 11/09/2019: EF 60-65%, trivial aortic regurgitation     Latest Reference Range & Units Most Recent   CHOLESTEROL 0 - 199 mg/dL 182  05/08/21 12:25   HDL-CHOLESTEROL 40 - 60 mg/dL 55  05/08/21 12:25   LDL (CALCULATED) 0 - 130 mg/dL  96  05/08/21 12:25   TRIGLYCERIDES 0 - 149 mg/dL 153 (H)  05/08/21 12:25   (H): Data is abnormally high    Past Medical History:   Diagnosis Date    Anxiety     Arthritis     Back problem     DDD    Beta blocker prescribed for left ventricular systolic dysfunction     Blood thinned due to long-term anticoagulant use     Carotid stenosis     occlusion of left ICA    Chest pain 04/03/2018    Chronic back pain     Coronary artery disease involving native coronary artery of native heart     DDD (degenerative disc disease), lumbar     "entire spine"    Ear piercing     GERD (gastroesophageal reflux disease)     controlled with omeprazole    H/O complete eye exam     2 years, Dr. [name], at [facility]    History of dental examination     dentures upper and lower    HTN     Hx of coronary artery bypass graft     2012    Hx of gastric bypass     Hypercholesterolemia     Hyperlipidemia     Hypothyroid  MI (myocardial infarction) (CMS [place]) 2012    Neck problem     herniated cervical disc    Obesity (BMI 30-39.9)     Peripheral vascular disease, unspecified (CMS [place]) 01/03/2021    Solitary nodule of right lobe of thyroid 03/30/2018    Incidental finding on MRI thoracic spine. [place] ordered.     Stented coronary artery     x4    Stroke (CMS [facility])     2002    Tattoo     Left breast, Right shoulder    Thyroid disorder     Thyroid nodule     Type 2 diabetes mellitus (CMS [facility])     HGA1C 10.4 on 12/19/21    Uncontrolled type 2 diabetes mellitus, without long-term current use of insulin     Xanthelasma            Patient Active Problem List    Diagnosis Date Noted    Unspecified inflammatory spondylopathy, lumbar region (CMS [facility]) 07/17/2022    Other specified disorders of adrenal gland (CMS [place]) 07/17/2022    Left leg weakness 05/15/2021    Dizziness 05/15/2021    Chronic lumbar radiculopathy 05/15/2021    Hypertension associated with type 2 diabetes mellitus (CMS [place])  05/15/2021    Type 2 diabetes mellitus with complication, without long-term  current use of insulin (CMS [place]) 05/15/2021    Major depressive disorder, recurrent, unspecified (CMS [place]) 01/03/2021    Peripheral vascular disease, unspecified (CMS [place]) 01/03/2021    Fracture of phalanx of finger of right hand 12/05/2019    Spinal pain 12/05/2019    Type 2 diabetes mellitus with hyperglycemia (CMS [place]) 12/05/2019    Upper respiratory infection 12/05/2019    Thyroid disorder 09/16/2019    UDS: 07/28/19 08/02/2019    Sinus congestion 12/07/2018    Morbid obesity (CMS [place]) 09/13/2018    Type 2 diabetes mellitus with hyperosmolarity without coma, without long-term current use of insulin (CMS [facility])     Coronary artery disease involving native coronary artery of native heart     GERD (gastroesophageal reflux disease)     HTN (hypertension)     Hyperlipidemia     Constipation 04/15/2018    Thyroid nodule 04/15/2018    Chest pain 04/03/2018    DDD (degenerative disc disease), lumbar      "entire spine"      Anxiety     Chronic back pain     Carotid stenosis     BMI 32.0-32.9,adult     H/O complete eye exam     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 [name], [facility]    HX CORONARY STENT PLACEMENT  2012    x4 stents    HX GASTRIC SLEEVE  02/2017    [place], [place], Dr. [name] HEART CATHETERIZATION  2012    massive heart attack and stents - [facility]  HX THYROID BIOPSY      HX THYROIDECTOMY  101/01/2018    HX TONSILLECTOMY      as child    HX YAG Left 09/20/2013            Current Outpatient Medications   Medication Sig    aspirin (ECOTRIN) 81 mg Oral Tablet, Delayed Release (E.C.)  Take 1 Tablet (81 mg total) by mouth Once a day    Blood Sugar Diagnostic (ACCU-CHEK GUIDE TEST STRIPS) Does not apply Strip TEST BLOOD SUGAR THREE TIMES DAILY    Blood-Glucose Meter (ACCU-CHEK AVIVA PLUS METER) Misc 1 Kit Three times a day    clopidogreL (PLAVIX) 75 mg Oral Tablet TAKE 1 TABLET EVERY DAY    cyanocobalamin (VITAMIN B12) 1,000 mcg/mL Injection Solution INJECT 1 ML (1,000 MCG TOTAL) UNDER THE SKIN EVERY 30 DAYS    diclofenac sodium (VOLTAREN) 1 % Gel Apply 2 g topically Three times a day as needed    docusate sodium (COLACE) 100 mg Oral Capsule TAKE 1 CAPSULE TWICE DAILY    empagliflozin (JARDIANCE) 10 mg Oral Tablet Take 1 Tablet (10 mg total) by mouth Once a day    ergocalciferol, vitamin D2, (DRISDOL) 1,250 mcg (50,000 unit) Oral Capsule TAKE 1 CAPSULE BY MOUTH EVERY WEEK    fluticasone propionate (FLONASE) 50 mcg/actuation Nasal Spray, Suspension Administer 2 Sprays into each nostril Once a day    furosemide (LASIX) 40 mg Oral Tablet TAKE 1 TABLET(40 MG) BY MOUTH EVERY DAY    guaiFENesin 100 mg/5 mL Oral Liquid Take 10 mL (200 mg total) by mouth Every 4 hours (Patient not taking: Reported on 11/25/2022)    HYDROcodone-acetaminophen (NORCO) 10-325 mg Oral Tablet Take 1 Tablet by mouth Every 8 hours as needed for Pain [name] with [facility]    Lancets (ACCU-CHEK SOFTCLIX LANCETS) Misc To use daily    levothyroxine (SYNTHROID) 25 mcg Oral Tablet TAKE 1 TABLET ONE TIME DAILY    metoprolol succinate (TOPROL-XL) 25 mg Oral Tablet Sustained Release 24 hr Take 0.5 Tablets (12.5 mg total) by mouth Once a day    NARCAN 4 mg/actuation Nasal Spray, Non-Aerosol PT STATED SHE HAS NARCAN AT HOME BUT IS NOT TAKING IT    Needle, Disp, 25 G 25 gauge x 1" Needle 1 mL Every 30 days    niacin (NIASPAN) 500 mg Oral Tablet Sustained Release Take 1 Tablet (500 mg total) by mouth Once a day    nitroGLYCERIN (NITROSTAT) 0.4 mg Sublingual Tablet, Sublingual Place 1 Tablet (0.4 mg total) under the  tongue Every 5 minutes as needed for Chest pain for 3 doses over 15 minutes    omeprazole (PRILOSEC) 40 mg Oral Capsule, Delayed Release(E.C.) TAKE 1 CAPSULE EVERY DAY    ondansetron (ZOFRAN ODT) 8 mg Oral Tablet, Rapid Dissolve DISSOLVE 1 TABLET ON THE TONGUE EVERY 8 HOURS AS NEEDED FOR NAUSEA OR VOMITING    OZEMPIC 2 mg/dose (8 mg/3 mL) Subcutaneous Pen Injector Inject 2 mg under the skin Every 7 days    PRENATAL PLUS, CALCIUM CARB, 27 mg iron- 1 mg Oral Tablet Take 1 Tablet by mouth Once a day    rosuvastatin (CRESTOR) 40 mg Oral Tablet Take 1 Tablet (40 mg total) by mouth Once a day TAKE 1 TABLET BY MOUTH ONCE DAILY    sennosides-docusate sodium (SENOKOT-S) 8.6-50 mg Oral Tablet Take 1 Tablet by mouth Every evening    venlafaxine (EFFEXOR XR) 37.5 mg Oral Capsule, Sust. Release 24 hr TAKE  1 CAPSULE ONE TIME DAILY    ZYRTEC-D 5-120 mg Oral Tablet Sustained Release 12 hr Take 1 Tablet by mouth Once a day        Allergies   Allergen Reactions    Capron Dm [Pyrilamine-Dextromethorphan]  Other Adverse Reaction (Add comment)     LIGHT HEADED    Other      Coban dressing caused skin break down    Tylenol W Codeine [Acetaminophen-Codeine]  Other Adverse Reaction (Add comment)     pt states that one time she had chest pains with this med, but dr. thought it was because she had not eaten       Family Medical History:       Problem Relation (Age of Onset)    Atrial fibrillation Sister    Colon Cancer Father    Diabetes Mother, Maternal Aunt    Heart Disease Sister, Maternal Grandfather    Hypertension (High Blood Pressure) Mother    Lung Cancer Maternal Grandfather    MI <78 years of age Mother              Social History     Tobacco Use    Smoking status: Every Day     Packs/day: 0.50     Years: 20.00     Additional pack years: 0.00     Total pack years: 10.00     Types: Cigarettes     Start date: 53    Smokeless tobacco: Never   Substance Use Topics    Alcohol use: No       REVIEW OF SYSTEMS:  10 system review is  negative other than that stated in the HPI  Physical Exam  BP 120/60 Comment: rta  Pulse 68   Ht 1.651 m (_0 )   Wt 95.3 kg (210 lb)   LMP  (LMP Unknown)   SpO2 97%   BMI 34.95 kg/m       GEN:  Pleasant, conversant and in no acute distress  HEENT: Pupils equal and reactive.  Sclera nonicteric  Neck: No JVD, thyromegaly.  Scar consistent with previous thyroidectomy.  No appreciable bruit  Lungs: Clear bilaterally.  No appreciable wheezes  CV:  Regular rhythm.  Normal S1-S2.  ABD: Soft and nontender with no appreciable organomegaly  E XT: No cyanosis or edema  NEURO: CN 2-12 intact with no focal defects`  SKIN: No suspicious skin lesions         Assesment and Plan   1.  Coronary artery disease:  2 vessel bypass in 2012 and required percutaneous intervention in the ensuing years.  This does include stenting of the internal mammary graft as well as the left main extending into the left circumflex and LAD.  Doing well with no anginal symptoms.  Continue aggressive treatment underlying cardiovascular risk factors no anginal symptoms but will discuss need for cardiac testing for surveillance at her follow-up visit.  Encouraged smoking cessation.  2. Diabetes mellitus:  Continue diet and pharmacological treatment with goal A1c below 7.0.  Follows with PCP and Endocrinology.  From her description it appears that she is being evaluated for a functioning adrenal adenoma what sounds like a Cortrosyn stim test   3. Hypertension:  Blood pressure is well controlled on current regimen with no changes necessary  4. Tobacco abuse:  Patient counseled once again on importance of smoking cessation.  5. Hyperlipidemia:  No recent lipid panel.  Patient states that she has been busy and has had time  to get her blood work completed.  She agrees to do so in the near future and has a follow-up appointment with her primary care provider, Dr. Russ Halo next month.  6. Carotid artery disease:  No TIA or stroke-like symptoms.  Patient  has follow-up with vascular surgery and carotid duplex scheduled next month.  Duplex in 2020 reported no flow in the left internal carotid artery and less than 50% right ICA.  Continue to follow with vascular surgery in aggressive treatment of underlying cardiovascular risk factors    Paulita Cradle, MD  11/25/2022, 15:25

## 2022-12-04 ENCOUNTER — Ambulatory Visit (INDEPENDENT_AMBULATORY_CARE_PROVIDER_SITE_OTHER): Payer: Commercial Managed Care - PPO

## 2022-12-04 ENCOUNTER — Other Ambulatory Visit: Payer: Commercial Managed Care - PPO

## 2022-12-04 ENCOUNTER — Encounter (INDEPENDENT_AMBULATORY_CARE_PROVIDER_SITE_OTHER): Payer: Self-pay

## 2022-12-04 ENCOUNTER — Other Ambulatory Visit: Payer: Self-pay

## 2022-12-04 VITALS — BP 110/76 | HR 78 | Temp 97.2°F | Resp 18 | Ht 66.0 in | Wt 203.0 lb

## 2022-12-04 DIAGNOSIS — J069 Acute upper respiratory infection, unspecified: Secondary | ICD-10-CM | POA: Insufficient documentation

## 2022-12-04 LAB — COVID-19, FLU A/B, RSV RAPID BY PCR - LAB USE ONLY
INFLUENZA VIRUS TYPE A: NOT DETECTED
INFLUENZA VIRUS TYPE B: NOT DETECTED
RESPIRATORY SYNCTIAL VIRUS (RSV): NOT DETECTED
SARS-CoV-2: NOT DETECTED

## 2022-12-04 NOTE — Progress Notes (Signed)
[facility]    Progress Note    Name: Monique [name] MRN:  [id number]   Date: 12/04/2022 Age: 56 y.o.           Encounter Date: 12/04/2022   PCP: Cherly Beach, MD       Reason for Visit: Cough (Onset yesterday), Sore Throat, Generalized Body Aches, Fatigue, Runny Nose, Ear Pain, and Chills    History of Present Illness:  Monique [name] is a 56 y.o. female who is being seen today for cough, congestion, body aches, sore throat, and ear pain since yesterday. No sputum production or shortness of breath. Patient is a smoker but no history of lung disease.     There are no exam notes on file for this visit.    Past Medical History:   Diagnosis Date    Anxiety     Arthritis     Back problem     DDD    Beta blocker prescribed for left ventricular systolic dysfunction     Blood thinned due to long-term anticoagulant use     Carotid stenosis     occlusion of left ICA    Chest pain 04/03/2018    Chronic back pain     Coronary artery disease involving native coronary artery of native heart     DDD (degenerative disc disease), lumbar     "entire spine"    Ear piercing     GERD (gastroesophageal reflux disease)     controlled with omeprazole    H/O complete eye exam     2 years, Dr. [name], at [facility]    History of dental examination     dentures upper and lower    HTN     Hx of coronary artery bypass graft     2012    Hx of gastric bypass     Hypercholesterolemia     Hyperlipidemia     Hypothyroid     MI (myocardial infarction) (CMS [place]) 2012    Neck problem     herniated cervical disc    Obesity (BMI 30-39.9)     Peripheral vascular disease, unspecified (CMS [place]) 01/03/2021    Solitary nodule of right lobe of thyroid 03/30/2018    Incidental finding on MRI thoracic spine. [place] ordered.     Stented coronary artery     x4    Stroke (CMS [facility])     2002    Tattoo     Left breast, Right shoulder    Thyroid disorder     Thyroid nodule     Type 2 diabetes mellitus (CMS [facility])     HGA1C 10.4 on  12/19/21    Uncontrolled type 2 diabetes mellitus, without long-term current use of insulin     Xanthelasma      Past Surgical History:   Procedure Laterality Date    ENDOMETRIAL ABLATION  1998    HX ANKLE FRACTURE [place]    Right, Doran Durand procedure    HX CATARACT REMOVAL Bilateral 2013    r and l eyes with implants    HX CESAREAN SECTION  06/20/2000    x3, 11/21/86, 11/10/84    HX CHOLECYSTECTOMY  1988    HX CORONARY ARTERY BYPASS GRAFT  03/31/2011    cardiac, 2 vessel, Dr. [name], [facility]    [place]  2012    x4 stents    HX GASTRIC SLEEVE  02/2017  [place], [place], Dr. [name] HEART CATHETERIZATION  2012    mass[facility] heart [facility] stents - [facility]    HX THYROID BIOPSY      HX THYROIDECTOMY  101/01/2018    HX TONSILLECTOMY      as child    HX YAG Left 09/20/2013     Outpatient Medications Prior to This Office Visit:  aspirin (ECOTRIN) 81 mg Oral Tablet, Delayed Release (E.C.), Take 1 Tablet (81 mg total) by mouth Once a day  Blood Sugar Diagnostic (ACCU-CHEK GUIDE TEST STRIPS) Does not apply Strip, TEST BLOOD SUGAR THREE TIMES DAILY  Blood-Glucose Meter (ACCU-CHEK AVIVA PLUS METER) Misc, 1 Kit Three times a day  clopidogreL (PLAVIX) 75 mg Oral Tablet, TAKE 1 TABLET EVERY DAY  cyanocobalamin (VITAMIN B12) 1,000 mcg/mL Injection Solution, INJECT 1 ML (1,000 MCG TOTAL) UNDER THE SKIN EVERY 30 DAYS  diclofenac sodium (VOLTAREN) 1 % Gel, Apply 2 g topically Three times a day as needed  docusate sodium (COLACE) 100 mg Oral Capsule, TAKE 1 CAPSULE TWICE DAILY  empagliflozin (JARDIANCE) 10 mg Oral Tablet, Take 1 Tablet (10 mg total) by mouth Once a day  ergocalciferol, vitamin D2, (DRISDOL) 1,250 mcg (50,000 unit) Oral Capsule, TAKE 1 CAPSULE BY MOUTH EVERY WEEK  fluticasone propionate (FLONASE) 50 mcg/actuation Nasal Spray, Suspension, Administer 2 Sprays into each nostril Once a day  furosemide (LASIX) 40 mg Oral Tablet, TAKE 1 TABLET(40 MG) BY MOUTH EVERY DAY  guaiFENesin 100 mg/5 mL  Oral Liquid, Take 10 mL (200 mg total) by mouth Every 4 hours (Patient not taking: Reported on 11/25/2022)  HYDROcodone-acetaminophen (NORCO) 10-325 mg Oral Tablet, Take 1 Tablet by mouth Every 8 hours as needed for Pain [name] with [facility]  Lancets (ACCU-CHEK SOFTCLIX LANCETS) Misc, To use daily  levothyroxine (SYNTHROID) 25 mcg Oral Tablet, TAKE 1 TABLET ONE TIME DAILY  metoprolol succinate (TOPROL-XL) 25 mg Oral Tablet Sustained Release 24 hr, Take 0.5 Tablets (12.5 mg total) by mouth Once a day  NARCAN 4 mg/actuation Nasal Spray, Non-Aerosol, PT STATED SHE HAS NARCAN AT HOME BUT IS NOT TAKING IT  Needle, Disp, 25 G 25 gauge x 1" Needle, 1 mL Every 30 days  niacin (NIASPAN) 500 mg Oral Tablet Sustained Release, Take 1 Tablet (500 mg total) by mouth Once a day  nitroGLYCERIN (NITROSTAT) 0.4 mg Sublingual Tablet, Sublingual, Place 1 Tablet (0.4 mg total) under the tongue Every 5 minutes as needed for Chest pain for 3 doses over 15 minutes  omeprazole (PRILOSEC) 40 mg Oral Capsule, Delayed Release(E.C.), TAKE 1 CAPSULE EVERY DAY  ondansetron (ZOFRAN ODT) 8 mg Oral Tablet, Rapid Dissolve, DISSOLVE 1 TABLET ON THE TONGUE EVERY 8 HOURS AS NEEDED FOR NAUSEA OR VOMITING  OZEMPIC 2 mg/dose (8 mg/3 mL) Subcutaneous Pen Injector, Inject 2 mg under the skin Every 7 days  PRENATAL PLUS, CALCIUM CARB, 27 mg iron- 1 mg Oral Tablet, Take 1 Tablet by mouth Once a day  rosuvastatin (CRESTOR) 40 mg Oral Tablet, Take 1 Tablet (40 mg total) by mouth Once a day TAKE 1 TABLET BY MOUTH ONCE DAILY  sennosides-docusate sodium (SENOKOT-S) 8.6-50 mg Oral Tablet, Take 1 Tablet by mouth Every evening  venlafaxine (EFFEXOR XR) 37.5 mg Oral Capsule, Sust. Release 24 hr, TAKE 1 CAPSULE ONE TIME DAILY  ZYRTEC-D 5-120 mg Oral Tablet Sustained Release 12 hr, Take 1 Tablet by mouth Once a day    No facility-administered medications prior to visit.     Allergies  Allergen Reactions    Capron Dm  [Pyrilamine-Dextromethorphan]  Other Adverse Reaction (Add comment)     LIGHT HEADED    Other      Coban dressing caused skin break down    Tylenol W Codeine [Acetaminophen-Codeine]  Other Adverse Reaction (Add comment)     pt states that one time she had chest pains with this med, but dr. thought it was because she had not eaten     Family Medical History:       Problem Relation (Age of Onset)    Atrial fibrillation Sister    Colon Cancer Father    Diabetes Mother, Maternal Aunt    Heart Disease Sister, Maternal Grandfather    Hypertension (High Blood Pressure) Mother    Lung Cancer Maternal Grandfather    MI <67 years of age Mother            Social History     Tobacco Use    Smoking status: Every Day     Packs/day: 0.50     Years: 20.00     Additional pack years: 0.00     Total pack years: 10.00     Types: Cigarettes     Start date: 58    Smokeless tobacco: Never   Vaping Use    Vaping Use: Never used   Substance Use Topics    Alcohol use: No    Drug use: No       Review of Systems:  Review of Systems   Constitutional:  Positive for chills. Negative for fever.   HENT:  Positive for congestion and sore throat. Negative for ear pain, sinus pain, trouble swallowing and voice change.    Eyes:  Negative for discharge.   Respiratory:  Positive for cough. Negative for chest tightness and shortness of breath.    Cardiovascular:  Negative for chest pain.   Gastrointestinal:  Negative for diarrhea and vomiting.   Genitourinary:  Negative for dysuria.   Musculoskeletal:  Positive for myalgias. Negative for neck stiffness.   Skin:  Negative for rash.        Physical Exam:  Vitals:    12/04/22 1707   BP: 110/76   Pulse: 78   Resp: 18   Temp: 36.2 C (97.2 F)   SpO2: 99%   Weight: 92.1 kg (203 lb)   Height: 1.676 m (_0 )   BMI: 32.83         Physical Exam  Constitutional:       General: She is not in acute distress.     Appearance: Normal appearance. She is not toxic-appearing.   HENT:      Right Ear: Tympanic membrane,  ear canal and external ear normal.      Left Ear: Tympanic membrane, ear canal and external ear normal.      Mouth/Throat:      Mouth: Mucous membranes are moist.      Pharynx: Oropharynx is clear. Uvula midline. No pharyngeal swelling, oropharyngeal exudate or posterior oropharyngeal erythema.      Tonsils: No tonsillar exudate or tonsillar abscesses.   Cardiovascular:      Rate and Rhythm: Normal rate and regular rhythm.      Pulses: Normal pulses.      Heart sounds: Normal heart sounds.   Pulmonary:      Effort: Pulmonary effort is normal. No respiratory distress.      Breath sounds: Normal breath sounds. No wheezing, rhonchi or rales.   Abdominal:  General: Bowel sounds are normal.      Palpations: Abdomen is soft.   Musculoskeletal:      Cervical back: Normal range of motion and neck supple. No edema or rigidity.   Lymphadenopathy:      Cervical: No cervical adenopathy.   Skin:     General: Skin is warm and dry.      Capillary Refill: Capillary refill takes less than 2 seconds.      Findings: No rash.   Neurological:      Mental Status: She is alert and oriented to person, place, and time. Mental status is at baseline.         Assessment/Plan:  1. Viral URI  Covid-19 and influenza tests sent. Continue fever control and start Flonase 1-2 times daily. Given indications for return, follow up with PCP, or presentation to the ER. Patient stated their understanding.     - COVID-19 SCREENING - SYMPTOMATIC (OP); Future  - INFLUENZA VIRUS TYPE A AND TYPE B, PCR; Future  - COVID-19 SCREENING - SYMPTOMATIC (OP)  - INFLUENZA VIRUS TYPE A AND TYPE B, PCR         Follow up: Return if symptoms worsen or fail to improve.    Seek medical attention for new or worsening symptoms.    Audree Camel, PA      This note was partially created using MModal Fluency Direct system (voice recognition software) and is inherently subject to errors including those of syntax and "sound-alike" substitutions which may escape proofreading.  In  such instances, original meaning may be extrapolated by contextual derivation.

## 2022-12-05 ENCOUNTER — Other Ambulatory Visit (INDEPENDENT_AMBULATORY_CARE_PROVIDER_SITE_OTHER): Payer: Self-pay | Admitting: Registered Nurse

## 2022-12-05 ENCOUNTER — Encounter (HOSPITAL_COMMUNITY)
Admission: RE | Disposition: A | Discharge status: Home / Self Care | Payer: Self-pay | Source: Home / Self Care | Attending: Internal Medicine

## 2022-12-05 ENCOUNTER — Ambulatory Visit (HOSPITAL_COMMUNITY): Payer: 59 | Admitting: Anesthesiology

## 2022-12-05 ENCOUNTER — Ambulatory Visit (HOSPITAL_COMMUNITY)
Admission: RE | Admit: 2022-12-05 | Discharge: 2022-12-05 | Disposition: A | Discharge status: Home / Self Care | Payer: 59 | Attending: Internal Medicine | Admitting: Internal Medicine

## 2022-12-05 ENCOUNTER — Encounter (HOSPITAL_COMMUNITY): Payer: Self-pay

## 2022-12-05 ENCOUNTER — Other Ambulatory Visit: Payer: Self-pay

## 2022-12-05 DIAGNOSIS — K2970 Gastritis, unspecified, without bleeding: Secondary | ICD-10-CM | POA: Insufficient documentation

## 2022-12-05 DIAGNOSIS — K30 Functional dyspepsia: Secondary | ICD-10-CM | POA: Diagnosis not present

## 2022-12-05 DIAGNOSIS — Z87891 Personal history of nicotine dependence: Secondary | ICD-10-CM | POA: Diagnosis not present

## 2022-12-05 DIAGNOSIS — F418 Other specified anxiety disorders: Secondary | ICD-10-CM

## 2022-12-05 DIAGNOSIS — M1990 Unspecified osteoarthritis, unspecified site: Secondary | ICD-10-CM | POA: Diagnosis not present

## 2022-12-05 DIAGNOSIS — K219 Gastro-esophageal reflux disease without esophagitis: Secondary | ICD-10-CM | POA: Diagnosis not present

## 2022-12-05 DIAGNOSIS — K449 Diaphragmatic hernia without obstruction or gangrene: Secondary | ICD-10-CM

## 2022-12-05 DIAGNOSIS — R1013 Epigastric pain: Secondary | ICD-10-CM

## 2022-12-05 DIAGNOSIS — J069 Acute upper respiratory infection, unspecified: Secondary | ICD-10-CM

## 2022-12-05 HISTORY — PX: ESOPHAGOGASTRODUODENOSCOPY (EGD) WITH PROPOFOL: SHX5813

## 2022-12-05 HISTORY — PX: BIOPSY: SHX5522

## 2022-12-05 SURGERY — ESOPHAGOGASTRODUODENOSCOPY (EGD) WITH PROPOFOL
Anesthesia: General

## 2022-12-05 MED ORDER — DEXLANSOPRAZOLE 60 MG PO CPDR: 60.0000 mg | DELAYED_RELEASE_CAPSULE | Freq: Every day | ORAL | 11 refills | Status: DC

## 2022-12-05 MED ORDER — LIDOCAINE HCL (CARDIAC) PF 100 MG/5ML IV SOSY
PREFILLED_SYRINGE | INTRAVENOUS | Status: DC | PRN
  Administered 2022-12-05: 50 mg via INTRATRACHEAL

## 2022-12-05 MED ORDER — PROPOFOL 10 MG/ML IV BOLUS
INTRAVENOUS | Status: DC | PRN
  Administered 2022-12-05: 80 mg via INTRAVENOUS
  Administered 2022-12-05: 30 mg via INTRAVENOUS

## 2022-12-05 MED ORDER — LACTATED RINGERS IV SOLN: INTRAVENOUS | Status: DC

## 2022-12-05 MED ORDER — BENZONATATE 200 MG CAPSULE
200.0000 mg | ORAL_CAPSULE | Freq: Three times a day (TID) | ORAL | 0 refills | Status: AC
Start: 2022-12-05 — End: 2022-12-15

## 2022-12-05 NOTE — H&P (Signed)
Primary Care Physician:  Richardean Chimera, MD Primary Gastroenterologist:  Dr. Marletta Lor  Pre-Procedure History & Physical: HPI:  Ann [name] is a 56 y.o. female is here for an EGD due to abdominal pain   Past Medical History:  Diagnosis Date   Anxiety    Arthritis    Chronic back pain    Complication of anesthesia    pt states, "i have migraine and high BP after surgery.".   Depression    GERD (gastroesophageal reflux disease)    Migraines    Pancreatitis     Past Surgical History:  Procedure Laterality Date   BILE DUCT STENT PLACEMENT  07/2011   Dr. Steffanie Dunn: fully covered metal wall stent placed.    CHOLECYSTECTOMY  2010   with bile leak   COLONOSCOPY  2011   [facility]   COLONOSCOPY WITH PROPOFOL N/A 03/02/2018   two 2-3 mm hyperplastic polyps, torturous left colon, mild diverticulosis in rectosigmoid, internal hemorrhoids, colonic biopsies negative   ERCP  2011-2013   multiple, with multiple stent placements/exchanges. Metal wall stent placed in Aug 2012 AND STILL PRESENT AS OF 2019   ESOPHAGOGASTRODUODENOSCOPY (EGD) WITH PROPOFOL N/A 03/02/2018   dysphagia due to benign-appearing esophageal stricture/GERD, s/p dilation, small hiatal hernia, gastritis.    SAVORY DILATION N/A 03/02/2018   Procedure: SAVORY DILATION;  Surgeon: West Bali, MD;  Location: [facility];  Service: Endoscopy;  Laterality: N/A;    Prior to Admission medications   Medication Sig Start Date End Date Taking? Authorizing Provider  ALPRAZolam Prudy Feeler) 1 MG tablet Take 0.5-1 mg by mouth 2 (two) times daily as needed for anxiety or sleep. 01/07/18  Yes [provider]  amphetamine-dextroamphetamine (ADDERALL) 20 MG tablet Take 20 mg by mouth every morning. As needed   Yes [provider]  ibuprofen (ADVIL,MOTRIN) 800 MG tablet Take 800 mg by mouth every 8 (eight) hours as needed (for joint pain.).    Yes [provider]  omeprazole (PRILOSEC OTC) 20 MG tablet Take  20 mg by mouth daily.   Yes [provider]  omeprazole (PRILOSEC) 20 MG capsule Take 1 capsule (20 mg total) by mouth 2 (two) times daily before a meal. 09/30/22   Gelene Mink, NP    Allergies as of 10/15/2022 - Review Complete 09/30/2022  Allergen Reaction Noted   Imitrex [sumatriptan] Anaphylaxis and Rash 06/15/2012   Cymbalta [duloxetine hcl] Diarrhea, Nausea Only, and Other (See Comments) 02/12/2016   Dicyclomine  03/02/2018   Doxycycline Diarrhea 06/15/2012   Gabapentin Other (See Comments) 02/17/2018   Lyrica [pregabalin] Swelling and Other (See Comments) 06/15/2012   Nucynta [tapentadol]  05/20/2015   Nuvigil [armodafinil] Other (See Comments) 02/12/2016   Other  06/06/2015   Penicillins Other (See Comments) 02/12/2016   Tape  07/24/2019   Amoxicillin-pot clavulanate Diarrhea and Rash 06/15/2012   Butrans [buprenorphine] Hives, Swelling, and Rash 06/15/2012   Ciprofloxacin Diarrhea, Nausea And Vomiting, and Other (See Comments) 02/12/2016    Family History  Problem Relation Age of Onset   Cervical cancer Mother    Heart attack Other    Migraines Neg Hx    Seizures Neg Hx    Dementia Neg Hx    Colon cancer Neg Hx     Social History   Socioeconomic History   Marital status: Married    Spouse name: Onalee Hua   Number of children: 2   Years of education: 11   Highest education level: Not on file  Occupational  History   Occupation: Workers comp  Tobacco Use   Smoking status: Former    Packs/day: 0.25    Years: 10.00    Total pack years: 2.50    Types: Cigarettes    Quit date: 12/09/2011    Years since quitting: 10.9   Smokeless tobacco: Never  Vaping Use   Vaping Use: Never used  Substance and Sexual Activity   Alcohol use: No   Drug use: No   Sexual activity: Yes    Birth control/protection: Post-menopausal  Other Topics Concern   Not on file  Social History Narrative   Lives w/ husband   Caffeine use:  none   Social Determinants of Health    Financial Resource Strain: Not on file  Food Insecurity: Not on file  Transportation Needs: Not on file  Physical Activity: Not on file  Stress: Not on file  Social Connections: Not on file  Intimate Partner Violence: Not on file    Review of Systems: General: Negative for fever, chills, fatigue, weakness. Eyes: Negative for vision changes.  ENT: Negative for hoarseness, difficulty swallowing , nasal congestion. CV: Negative for chest pain, angina, palpitations, dyspnea on exertion, peripheral edema.  Respiratory: Negative for dyspnea at rest, dyspnea on exertion, cough, sputum, wheezing.  GI: See history of present illness. GU:  Negative for dysuria, hematuria, urinary incontinence, urinary frequency, nocturnal urination.  MS: Negative for joint pain, low back pain.  Derm: Negative for rash or itching.  Neuro: Negative for weakness, abnormal sensation, seizure, frequent headaches, memory loss, confusion.  Psych: Negative for anxiety, depression Endo: Negative for unusual weight change.  Heme: Negative for bruising or bleeding. Allergy: Negative for rash or hives.  Physical Exam: Vital signs in last 24 hours: Temp:  [98.2 F (36.8 C)] 98.2 F (36.8 C) (12/29 0654) Pulse Rate:  [75] 75 (12/29 0654) Resp:  [20] 20 (12/29 0654) BP: (140)/(74) 140/74 (12/29 0654) SpO2:  [97 %] 97 % (12/29 0654)   General:   Alert,  Well-developed, well-nourished, pleasant and cooperative in NAD Head:  Normocephalic and atraumatic. Eyes:  Sclera clear, no icterus.   Conjunctiva pink. Ears:  Normal auditory acuity. Nose:  No deformity, discharge,  or lesions. Msk:  Symmetrical without gross deformities. Normal posture. Extremities:  Without clubbing or edema. Neurologic:  Alert and  oriented x4;  grossly normal neurologically. Skin:  Intact without significant lesions or rashes. Psych:  Alert and cooperative. Normal mood and affect.   Impression/Plan: Ann [name] is here for an EGD  to be performed for abdominal pain  Risks, benefits, limitations, imponderables and alternatives regarding EGD have been reviewed with the patient. Questions have been answered. All parties agreeable.

## 2022-12-05 NOTE — Discharge Instructions (Addendum)
EGD Discharge instructions Please read the instructions outlined below and refer to this sheet in the next few weeks. These discharge instructions provide you with general information on caring for yourself after you leave the hospital. Your doctor may also give you specific instructions. While your treatment has been planned according to the most current medical practices available, unavoidable complications occasionally occur. If you have any problems or questions after discharge, please call your doctor. ACTIVITY You may resume your regular activity but move at a slower pace for the next 24 hours.  Take frequent rest periods for the next 24 hours.  Walking will help expel (get rid of) the air and reduce the bloated feeling in your abdomen.  No driving for 24 hours (because of the anesthesia (medicine) used during the test).  You may shower.  Do not sign any important legal documents or operate any machinery for 24 hours (because of the anesthesia used during the test).  NUTRITION Drink plenty of fluids.  You may resume your normal diet.  Begin with a light meal and progress to your normal diet.  Avoid alcoholic beverages for 24 hours or as instructed by your caregiver.  MEDICATIONS You may resume your normal medications unless your caregiver tells you otherwise.  WHAT YOU CAN EXPECT TODAY You may experience abdominal discomfort such as a feeling of fullness or "gas" pains.  FOLLOW-UP Your doctor will discuss the results of your test with you.  SEEK IMMEDIATE MEDICAL ATTENTION IF ANY OF THE FOLLOWING OCCUR: Excessive nausea (feeling sick to your stomach) and/or vomiting.  Severe abdominal pain and distention (swelling).  Trouble swallowing.  Temperature over 101 F (37.8 C).  Rectal bleeding or vomiting of blood.    Your EGD revealed mild amount inflammation in your stomach.  I took biopsies of this to rule out infection with a bacteria called H. pylori.  Await pathology results, my  office will contact you.  You have a small hiatal hernia. Otherwise esophagus and small bowel appeared normal.  I am going to change your acid medication to generic Dexialnt daily and have sent this to your pharmacy.  Follow-up with GI in 2 to 3 months. OFFICE WILL CONTACT YOU WITH APPOINTMENT   I hope you have a great rest of your week!  Hennie Duos. Marletta Lor, D.O. Gastroenterology and Hepatology [facility]

## 2022-12-05 NOTE — Anesthesia Preprocedure Evaluation (Addendum)
Anesthesia Evaluation  Patient identified by MRN, date of birth, ID band Patient awake    Reviewed: Allergy & Precautions, H&P , NPO status , Patient's Chart, lab work & pertinent test results  History of Anesthesia Complications (+) history of anesthetic complications  Airway Mallampati: II  TM Distance: >3 FB Neck ROM: Full   Comment: History of cervical spine trauma Neck pain Paresthesias   Dental no notable dental hx. (+) Teeth Intact, Dental Advisory Given   Pulmonary former smoker   Pulmonary exam normal breath sounds clear to auscultation       Cardiovascular negative cardio ROS Normal cardiovascular exam Rhythm:Regular Rate:Normal     Neuro/Psych  Headaches PSYCHIATRIC DISORDERS Anxiety Depression       GI/Hepatic Neg liver ROS,GERD  Medicated and Poorly Controlled,,  Endo/Other  negative endocrine ROS    Renal/GU negative Renal ROS  negative genitourinary   Musculoskeletal  (+) Arthritis , Osteoarthritis,    Abdominal   Peds negative pediatric ROS (+)  Hematology negative hematology ROS (+)   Anesthesia Other Findings History of cervical spine trauma Neck pain Paresthesias    Reproductive/Obstetrics negative OB ROS                             Anesthesia Physical Anesthesia Plan  ASA: 2  Anesthesia Plan: General   Post-op Pain Management: Minimal or no pain anticipated   Induction: Intravenous  PONV Risk Score and Plan: Propofol infusion  Airway Management Planned: Nasal Cannula and Natural Airway  Additional Equipment:   Intra-op Plan:   Post-operative Plan:   Informed Consent: I have reviewed the patients History and Physical, chart, labs and discussed the procedure including the risks, benefits and alternatives for the proposed anesthesia with the patient or authorized representative who has indicated his/her understanding and acceptance.     Dental  advisory given  Plan Discussed with: CRNA and Surgeon  Anesthesia Plan Comments:        Anesthesia Quick Evaluation

## 2022-12-05 NOTE — Op Note (Addendum)
[facility] Patient Name: Ann [name] Procedure Date: 12/05/2022 7:12 AM MRN: [id number] Date of Birth: 29-Jan-1966 Attending MD: Elon Alas. Abbey Chatters , [place], [id number] CSN: [id number] Age: 56 Admit Type: Outpatient Procedure:                Upper GI endoscopy Indications:              Functional Dyspepsia Providers:                Elon Alas. Abbey Chatters, DO, Tammy Vaught, RN, Everardo Pacific Referring MD:              Medicines:                See the Anesthesia note for documentation of the                            administered medications Complications:            No immediate complications. Estimated Blood Loss:     Estimated blood loss was minimal. Procedure:                Pre-Anesthesia Assessment:                           - The anesthesia plan was to use monitored                            anesthesia care (MAC).                           After obtaining informed consent, the endoscope was                            passed under direct vision. Throughout the                            procedure, the patient's blood pressure, pulse, and                            oxygen saturations were monitored continuously. The                            [id number]-H190 ([id number]) scope was introduced through the                            mouth, and advanced to the second part of duodenum.                            The upper GI endoscopy was accomplished without                            difficulty. The patient tolerated the procedure                            well. Scope In: 7:32:20 AM Scope  Out: 7:35:48 AM Total Procedure Duration: 0 hours 3 minutes 28 seconds  Findings:      A 2 cm hiatal hernia was present.      The Z-line was regular and was found 36 cm from the incisors.      Patchy mild inflammation characterized by erythema and linear erosions       was found in the gastric body and in the gastric antrum. Biopsies were       taken with a cold forceps  for Helicobacter pylori testing.      The duodenal bulb, first portion of the duodenum and second portion of       the duodenum were normal. Impression:               - 2 cm hiatal hernia.                           - Z-line regular, 36 cm from the incisors.                           - Gastritis. Biopsied.                           - Normal duodenal bulb, first portion of the                            duodenum and second portion of the duodenum. Moderate Sedation:      Per Anesthesia Care Recommendation:           - Patient has a contact number available for                            emergencies. The signs and symptoms of potential                            delayed complications were discussed with the                            patient. Return to normal activities tomorrow.                            Written discharge instructions were provided to the                            patient.                           - Resume previous diet.                           - Continue present medications.                           - Await pathology results.                           - Return to GI clinic in 3 months.                           -  Use Dexilant (dexlansoprazole) 60 mg PO daily. Procedure Code(s):        --- Professional ---                           [phone], Esophagogastroduodenoscopy, flexible,                            transoral; with biopsy, single or multiple Diagnosis Code(s):        --- Professional ---                           K44.9, Diaphragmatic hernia without obstruction or                            gangrene                           K29.70, Gastritis, unspecified, without bleeding                           K30, Functional dyspepsia CPT copyright 2022 American Medical Association. All rights reserved. The codes documented in this report are preliminary and upon coder review may  be revised to meet current compliance requirements. Elon Alas. Abbey Chatters, DO [place] Abbey Chatters,  DO 12/05/2022 7:40:05 AM This report has been signed electronically. Number of Addenda: 0

## 2022-12-05 NOTE — Transfer of Care (Signed)
Immediate Anesthesia Transfer of Care Note  Patient: Ann [name]  Procedure(s) Performed: ESOPHAGOGASTRODUODENOSCOPY (EGD) WITH PROPOFOL BIOPSY  Patient Location: Endoscopy Unit  Anesthesia Type:General  Level of Consciousness: awake, alert , oriented, and patient cooperative  Airway & Oxygen Therapy: Patient Spontanous Breathing  Post-op Assessment: Report given to RN, Post -op Vital signs reviewed and stable, and Patient moving all extremities  Post vital signs: Reviewed and stable  Last Vitals:  Vitals Value Taken Time  BP    Temp    Pulse    Resp    SpO2      Last Pain:  Vitals:   12/05/22 0727  TempSrc:   PainSc: 5       Patients Stated Pain Goal: 6 (12/05/22 0654)  Complications: No notable events documented.

## 2022-12-05 NOTE — Anesthesia Postprocedure Evaluation (Signed)
Anesthesia Post Note  Patient: Ann [name]  Procedure(s) Performed: ESOPHAGOGASTRODUODENOSCOPY (EGD) WITH PROPOFOL BIOPSY  Patient location during evaluation: Phase II Anesthesia Type: General Level of consciousness: awake and alert and oriented Pain management: pain level controlled Vital Signs Assessment: post-procedure vital signs reviewed and stable Respiratory status: spontaneous breathing, nonlabored ventilation and respiratory function stable Cardiovascular status: blood pressure returned to baseline and stable Postop Assessment: no apparent nausea or vomiting Anesthetic complications: no  No notable events documented.   Last Vitals:  Vitals:   12/05/22 0738 12/05/22 0744  BP: (!) 105/50 121/68  Pulse: 73 76  Resp: (!) 21 (!) 22  Temp: 36.7 C 36.7 C  SpO2: 97% 98%    Last Pain:  Vitals:   12/05/22 0744  TempSrc: Oral  PainSc: 0-No pain                 Twylla Arceneaux C Jernee Murtaugh

## 2022-12-09 ENCOUNTER — Telehealth: Payer: Self-pay

## 2022-12-09 LAB — SURGICAL PATHOLOGY

## 2022-12-09 NOTE — Telephone Encounter (Signed)
PA done for Dexlansoprazole 60 mg DR capsules on [employer]. Tried/failed: Protonix X 2 a day, Bentayl. Dx: Gastritis. Waiting on a response from [employer].

## 2022-12-12 ENCOUNTER — Encounter (HOSPITAL_COMMUNITY): Payer: Self-pay | Admitting: Internal Medicine

## 2022-12-15 ENCOUNTER — Telehealth (INDEPENDENT_AMBULATORY_CARE_PROVIDER_SITE_OTHER): Payer: Self-pay

## 2022-12-15 MED ORDER — RABEPRAZOLE SODIUM 20 MG PO TBEC: 20.0000 mg | DELAYED_RELEASE_TABLET | Freq: Two times a day (BID) | ORAL | 5 refills | Status: DC

## 2022-12-15 NOTE — Telephone Encounter (Signed)
Mandy: can you please have patient come in sooner than March? No need for urgent, just an open slot that exists in next few weeks. Thanks!

## 2022-12-15 NOTE — Telephone Encounter (Signed)
Left message reminding patient of upcoming appointment in vascular surgery office.      [place] Decoteau, CMA

## 2022-12-16 ENCOUNTER — Encounter (INDEPENDENT_AMBULATORY_CARE_PROVIDER_SITE_OTHER): Payer: Self-pay

## 2022-12-16 ENCOUNTER — Ambulatory Visit (HOSPITAL_BASED_OUTPATIENT_CLINIC_OR_DEPARTMENT_OTHER): Payer: Self-pay

## 2022-12-16 MED ORDER — ZENPEP 40000-126000 UNITS PO CPEP: 2.0000 | ORAL_CAPSULE | Freq: Three times a day (TID) | ORAL | 5 refills | Status: DC

## 2022-12-16 NOTE — Addendum Note (Signed)
Addended by: Annitta Needs on: 12/16/2022 03:12 PM   Modules accepted: Orders

## 2022-12-20 ENCOUNTER — Other Ambulatory Visit (INDEPENDENT_AMBULATORY_CARE_PROVIDER_SITE_OTHER): Payer: Self-pay | Admitting: Family Medicine

## 2022-12-20 DIAGNOSIS — E785 Hyperlipidemia, unspecified: Secondary | ICD-10-CM

## 2022-12-20 DIAGNOSIS — R609 Edema, unspecified: Secondary | ICD-10-CM

## 2022-12-22 NOTE — Telephone Encounter (Signed)
Last scheduled appointment with you was 09/18/2022.  Currently scheduled future appointment is 12/31/2022.      Rochele Pages, LPN  9/39/6886, 48:47

## 2022-12-30 ENCOUNTER — Other Ambulatory Visit: Payer: Self-pay

## 2022-12-30 MED ORDER — AMITRIPTYLINE HCL 10 MG PO TABS: 10.0000 mg | ORAL_TABLET | Freq: Every day | ORAL | 2 refills | Status: DC

## 2022-12-30 NOTE — Telephone Encounter (Signed)
noted 

## 2022-12-31 ENCOUNTER — Ambulatory Visit (INDEPENDENT_AMBULATORY_CARE_PROVIDER_SITE_OTHER): Payer: Commercial Managed Care - PPO | Admitting: Family Medicine

## 2022-12-31 ENCOUNTER — Encounter (INDEPENDENT_AMBULATORY_CARE_PROVIDER_SITE_OTHER): Payer: Self-pay | Admitting: Family Medicine

## 2022-12-31 VITALS — BP 124/72 | HR 86 | Temp 97.3°F | Resp 17 | Ht 66.0 in | Wt 212.2 lb

## 2022-12-31 DIAGNOSIS — E1165 Type 2 diabetes mellitus with hyperglycemia: Secondary | ICD-10-CM

## 2022-12-31 DIAGNOSIS — F331 Major depressive disorder, recurrent, moderate: Secondary | ICD-10-CM

## 2022-12-31 DIAGNOSIS — Z6834 Body mass index (BMI) 34.0-34.9, adult: Secondary | ICD-10-CM

## 2022-12-31 DIAGNOSIS — E1159 Type 2 diabetes mellitus with other circulatory complications: Secondary | ICD-10-CM

## 2022-12-31 DIAGNOSIS — I739 Peripheral vascular disease, unspecified: Secondary | ICD-10-CM

## 2022-12-31 DIAGNOSIS — I152 Hypertension secondary to endocrine disorders: Secondary | ICD-10-CM

## 2022-12-31 DIAGNOSIS — M4696 Unspecified inflammatory spondylopathy, lumbar region: Secondary | ICD-10-CM

## 2022-12-31 DIAGNOSIS — J449 Chronic obstructive pulmonary disease, unspecified: Secondary | ICD-10-CM

## 2022-12-31 DIAGNOSIS — I70221 Atherosclerosis of native arteries of extremities with rest pain, right leg: Secondary | ICD-10-CM

## 2022-12-31 DIAGNOSIS — E278 Other specified disorders of adrenal gland: Secondary | ICD-10-CM

## 2022-12-31 HISTORY — DX: Chronic obstructive pulmonary disease, unspecified: J44.9

## 2022-12-31 NOTE — Progress Notes (Signed)
FAMILY MEDICINE, [facility] PRIMARY CARE  [place] [address]       Name: Monique [name] MRN:  [id number]   Date: 12/31/2022 Age: 57 y.o.     Chief complaint: The patient is a 57 y.o. old female who came in today for Diabetes (Last A1C -- 12/19/21 -- 10.4% )    HPI:    Diabetes: Last HbA1C was 8.1% per her endocrinologist records.  (Dr. Tennis Ship)  Patient denies symptoms of polydipsia, polyuria, hypoglycemic unawareness, , chest pain, dyspnea on exertion, diarrhea, foot ulcerations, paresthesias. Symptoms are not changed. Patient's home blood sugars are unknown.  She seldom checks.  She has had no hypoglycemic events.  Patient is compliant with medications. Patient is currently prescribed Ozempic, and Jardiance.  She continues to lose weight.  She remains on statin.  Dr. Tennis Ship is following her adrenal function as well.  She has a challenge test scheduled for the morning.    MDD:  Controlled no side effects of the SSRI.          ROS:  Review of systems completed with all positives and pertinent negatives as per HPI. Otherwise, negative.    Past medical history:  Past Medical History:   Diagnosis Date    Anxiety     Arthritis     Back problem     DDD    Beta blocker prescribed for left ventricular systolic dysfunction     Blood thinned due to long-term anticoagulant use     Carotid stenosis     occlusion of left ICA    Chest pain 04/03/2018    Chronic back pain     Chronic obstructive pulmonary disease, unspecified COPD type (CMS [place]) 12/31/2022    Coronary artery disease involving native coronary artery of native heart     DDD (degenerative disc disease), lumbar     "entire spine"    Ear piercing     GERD (gastroesophageal reflux disease)     controlled with omeprazole    H/O complete eye exam     2 years, Dr. [name], at [facility]    History of dental examination     dentures upper and lower    HTN     Hx of coronary artery bypass graft     2012    Hx of gastric bypass      Hypercholesterolemia     Hyperlipidemia     Hypothyroid     MI (myocardial infarction) (CMS [place]) 2012    Neck problem     herniated cervical disc    Obesity (BMI 30-39.9)     Peripheral vascular disease, unspecified (CMS [place]) 01/03/2021    Solitary nodule of right lobe of thyroid 03/30/2018    Incidental finding on MRI thoracic spine. [place] ordered.     Stented coronary artery     x4    Stroke (CMS [facility])     2002    Tattoo     Left breast, Right shoulder    Thyroid disorder     Thyroid nodule     Type 2 diabetes mellitus (CMS HCC)     HGA1C 10.4 on 12/19/21    Uncontrolled type 2 diabetes mellitus, without long-term current use of insulin     Xanthelasma      Past surgical history:  Past Surgical History:   Procedure Laterality Date    ENDOMETRIAL ABLATION  1998    HX ANKLE FRACTURE [place]  Right, Doran Durand procedure    HX CATARACT REMOVAL Bilateral 2013    r and l eyes with implants    HX CESAREAN SECTION  06/20/2000    x3, 11/21/86, 11/10/84    HX CHOLECYSTECTOMY  1988    HX CORONARY ARTERY BYPASS GRAFT  03/31/2011    cardiac, 2 vessel, Dr. [name], [facility]    HX CORONARY STENT PLACEMENT  2012    x4 stents    HX GASTRIC SLEEVE  02/2017    [place], [place], Dr. [name] HEART CATHETERIZATION  2012    massive heart attack and stents - [facility]    HX THYROID BIOPSY      HX THYROIDECTOMY  101/01/2018    HX TONSILLECTOMY      as child    HX YAG Left 09/20/2013     Medications:  Current Outpatient Medications   Medication Sig    aspirin (ECOTRIN) 81 mg Oral Tablet, Delayed Release (E.C.) Take 1 Tablet (81 mg total) by mouth Once a day    Blood Sugar Diagnostic (ACCU-CHEK GUIDE TEST STRIPS) Does not apply Strip TEST BLOOD SUGAR THREE TIMES DAILY    Blood-Glucose Meter (ACCU-CHEK AVIVA PLUS METER) Misc 1 Kit Three times a day    clopidogreL (PLAVIX) 75 mg Oral Tablet TAKE 1 TABLET EVERY DAY    cyanocobalamin (VITAMIN B12) 1,000 mcg/mL Injection Solution INJECT 1 ML (1,000 MCG TOTAL) UNDER THE SKIN EVERY 30 DAYS     diclofenac sodium (VOLTAREN) 1 % Gel Apply 2 g topically Three times a day as needed    docusate sodium (COLACE) 100 mg Oral Capsule TAKE 1 CAPSULE TWICE DAILY    empagliflozin (JARDIANCE) 10 mg Oral Tablet Take 1 Tablet (10 mg total) by mouth Once a day    ergocalciferol, vitamin D2, (DRISDOL) 1,250 mcg (50,000 unit) Oral Capsule TAKE 1 CAPSULE BY MOUTH EVERY WEEK    fluticasone propionate (FLONASE) 50 mcg/actuation Nasal Spray, Suspension Administer 2 Sprays into each nostril Once a day    furosemide (LASIX) 40 mg Oral Tablet TAKE 1 TABLET EVERY DAY    HYDROcodone-acetaminophen (NORCO) 10-325 mg Oral Tablet Take 1 Tablet by mouth Every 8 hours as needed for Pain [name] with [facility]    Lancets (ACCU-CHEK SOFTCLIX LANCETS) Misc To use daily    levothyroxine (SYNTHROID) 25 mcg Oral Tablet TAKE 1 TABLET ONE TIME DAILY    metoprolol succinate (TOPROL-XL) 25 mg Oral Tablet Sustained Release 24 hr Take 0.5 Tablets (12.5 mg total) by mouth Once a day    NARCAN 4 mg/actuation Nasal Spray, Non-Aerosol PT STATED SHE HAS NARCAN AT HOME BUT IS NOT TAKING IT    Needle, Disp, 25 G 25 gauge x 1" Needle 1 mL Every 30 days    niacin (NIASPAN) 500 mg Oral Tablet Sustained Release 24 hr TAKE 1 TABLET EVERY DAY    nitroGLYCERIN (NITROSTAT) 0.4 mg Sublingual Tablet, Sublingual Place 1 Tablet (0.4 mg total) under the tongue Every 5 minutes as needed for Chest pain for 3 doses over 15 minutes    omeprazole (PRILOSEC) 40 mg Oral Capsule, Delayed Release(E.C.) TAKE 1 CAPSULE EVERY DAY    ondansetron (ZOFRAN ODT) 8 mg Oral Tablet, Rapid Dissolve DISSOLVE 1 TABLET ON THE TONGUE EVERY 8 HOURS AS NEEDED FOR NAUSEA OR VOMITING    OZEMPIC 2 mg/dose (8 mg/3 mL) Subcutaneous Pen Injector Inject 2 mg under the skin Every 7 days    PRENATAL PLUS, CALCIUM CARB, 27  mg iron- 1 mg Oral Tablet Take 1 Tablet by mouth Once a day    rosuvastatin (CRESTOR) 40 mg Oral Tablet Take 1 Tablet (40 mg total) by mouth Once a day TAKE 1  TABLET BY MOUTH ONCE DAILY    sennosides-docusate sodium (SENOKOT-S) 8.6-50 mg Oral Tablet Take 1 Tablet by mouth Every evening    venlafaxine (EFFEXOR XR) 37.5 mg Oral Capsule, Sust. Release 24 hr TAKE 1 CAPSULE ONE TIME DAILY    ZYRTEC-D 5-120 mg Oral Tablet Sustained Release 12 hr Take 1 Tablet by mouth Once a day         Vitals:    12/31/22 1402   BP: 124/72   Pulse: 86   Resp: 17   Temp: 36.3 C (97.3 F)   SpO2: 96%   Weight: 96.3 kg (212 lb 3.2 oz)   Height: 1.676 m ('5\' 6"'$ )   BMI: 34.32           Three year weight curve.    Physical Examination:    GENERAL:   Pt is a pleasant, well-nourished, well-developed 57 y.o. female who is in NAD. Appears stated age  44:  head normocephalic, symmetrical facies. EOM intact b/l. PERRLA. Sclera non-icteric, non-injected. upper eyelid w/Xythoma-lipid plaques, diminished in fullness and intensity. Oropharyngeal mucous membranes are moist  w/o erythema/exudates   NECK:   No masses, no lymphadenopathy, no JVD on exam. Trachea midline, no thyromegaly   CV: S1, S2. No murmurs, rubs, gallops.     LUNGS: CTAB, No rhonchi, rales, wheezes.   GI: (+) BS in all 4 quadrants. soft, NT/ND. No rigidity/positive for guarding/negative for rebound. No organomegaly/masses, or abdominal bruits,   MSK: Spontaneous normal AROM of major joints as observed during exam. No joint effusions/ swelling/ deformities.   Fourth digit he of the left lower extremity is swollen and erythematous.  Tender to touch.  No BLE edema, clubbing, or cyanosis.   2+ radial pulse present b/l.   + 2 reflexes Brachioradialis and patellar bilaterally.   Gait normal.  SKIN: No significant lesions, rashes, ecchymoses noted.   NEURO: AAOx4, CN grossly intact. Sensation intact b/l. No focal deficits.  PSYCH: Mood, behavior and affect normal w/ Intact judgement & insight  Exam stable.    CBC  Diff   Lab Results   Component Value Date/Time    WBC 9.8 12/17/2021 02:14 PM    HGB 14.3 12/17/2021 02:14 PM    HCT 42.7 12/17/2021  02:14 PM    PLTCNT 399 12/17/2021 02:14 PM    RBC 4.78 12/17/2021 02:14 PM    MCV 89.3 12/17/2021 02:14 PM    MCHC 33.5 12/17/2021 02:14 PM    MCH 29.9 12/17/2021 02:14 PM    RDW 14.2 05/08/2021 12:25 PM    MPV 9.6 12/17/2021 02:14 PM    Lab Results   Component Value Date/Time    PMNS 47 05/08/2021 12:25 PM    LYMPHOCYTES 40 05/08/2021 12:25 PM    EOSINOPHIL 2 05/08/2021 12:25 PM    MONOCYTES 12 05/08/2021 12:25 PM    BASOPHILS 1 05/08/2021 12:25 PM    BASOPHILS 0.05 05/08/2021 12:25 PM    PMNABS 4.25 05/08/2021 12:25 PM    LYMPHSABS 3.62 05/08/2021 12:25 PM    EOSABS 0.16 05/08/2021 12:25 PM    MONOSABS 1.07 (H) 05/08/2021 12:25 PM    BASOSABS 0.034 04/01/2011 05:08 AM          COMPREHENSIVE METABOLIC PANEL   Lab Results   Component Value  Date    SODIUM 137 12/17/2021    POTASSIUM 4.0 12/17/2021    CHLORIDE 105 12/17/2021    CO2 26 12/17/2021    ANIONGAP 6 12/17/2021    BUN 11 12/17/2021    CREATININE 0.98 12/17/2021    GLUCOSENF 177 (H) 05/08/2021    CALCIUM 9.1 12/17/2021    ALBUMIN 3.6 12/17/2021    TOTALPROTEIN 6.6 12/17/2021    ALKPHOS 111 12/17/2021    AST 13 12/17/2021    ALT 13 12/17/2021          12/31/22 1406   PHQ 9 (follow up)   Little interest or pleasure in doing things. 1   Feeling down, depressed, or hopeless 0   PHQ 2 Total 1   Trouble falling or staying asleep, or sleeping too much. 1   Feeling tired or having little energy 1   Poor appetite or overeating 1   Feeling bad about yourself/ that you are a failure in the past 2 weeks? 0   Trouble concentrating on things in the past 2 weeks? 1   Moving/Speaking slowly or being fidgety or restless  in the past 2 weeks? 1   Thoughts that you would be better off DEAD, or of hurting yourself in some way. 0   If you checked off any problems, how difficult have these problems made it for you to do your work, take care of things at home, or get along with other people? Not difficult at all   PHQ 9 Total 6   Interpretation of Total Score Mild depression        Diabetes Monitors  A1C: 10.4  A1C Date: 12/19/2021  Kidney Health:  Urine Microalbumin/Cr Ratio--5.9 Ur Microalb/Cr Ratio date--12/19/2021  eGFR  eGFR date    Last Lipid Panel  (Last result in the past 2 years)        Cholesterol   HDL   LDL   Direct LDL   Triglycerides      05/08/21 1225 182   55   96     153            Retinal Exam Date: Not Found DM Retinopathy - Negative Date:   Last Foot Exam: Not Found      Visit Diagnosis    Encounter Diagnoses   Name Primary?    Type 2 diabetes mellitus with hyperglycemia, without long-term current use of insulin (CMS HCC) Yes    Atherosclerosis of native artery of right lower extremity with rest pain (CMS HCC)     Chronic obstructive pulmonary disease, unspecified COPD type (CMS [facility])     Unspecified inflammatory spondylopathy, lumbar region (CMS HCC)     Moderate episode of recurrent major depressive disorder (CMS [facility])     Peripheral vascular disease, unspecified (CMS [place])     Morbid obesity (CMS [place])     Other specified disorders of adrenal gland (CMS [place])     Hypertension associated with type 2 diabetes mellitus (CMS [place])       Orders Placed This Encounter    MICROALBUMIN URINE, TIMED     Continue current management, will call for refills.  Patient has a cortisol challenge test planned tomorrow. Continue care with Dr. Ree Edman.  Other chronic conditions are stable.    Type 2 diabetes mellitus with complication, without long-term current use of insulin (CMS HCC)  Chronic worsening  control  HbA1C has been trending up.  Follow-up with Dr. Alvino Chapel  continue diabetic diet.  Continue to  address weight loss which is stalled.  Stop metformin for diarrhea.  Specific does based on A1c.    She will likely need metformin 1000 mg XR, b.i.d.  Continue Ozempic.  Prescribed but not using Jardiance.  Stopped Glyxambi 10-5 mg.    Encouraged her to check blood sugars 3-4 times daily.  Discuss renal concerns and need for monitoring.   A1c ordered as above.    Left lower extremity  4th digit fracture  Healed.    Allergic rhinitis:  Continue histamine blocker.  Will add pseudoephedrine.    Coronary artery disease  Status post CABG  Continue Plavix  Continue beta-blocker  Continue statin.  Continue sublingual nitro p.r.n. for chest pain.  Continue with Cardiology.  We review the encounter from 11/25/2022    Depression, unspecified depression type  Chronic, controlled.  See PHQ above.   Continue Effexor    Weight loss:  Weight loss has stabilized.  Continue behavioral efforts.      GERD  Chronic, Uncontrolled problem   Encouraged lifestyle modification to help with acid reflux, including:  Weight loss  Avoid eating for 2-3 hours prior to bedtime. Avoid late night snacking.   Avoid hot/spicy and acidic foods and overeating.   Remain upright after eating, avoid tight clothing.   Elevate head of bed.  Chew gum or lozenges.  Refrain from alcohol and smoking.   Continue histamine blocker.  Continue  proton pump inhibitor.  Continue General surgery.  She will attend to the emergency room if her symptoms warrant prior to that encounter.    Chronic left-sided lower extremity pain  Stable to improved.  Current diarrhea is felt to be the result of metformin.  Copies of the results are offered to the patient.  Patient with osteoarthritis  Has comorbid peripheral vascular disease  Will increase her statin follow.  Patient has follow-ups with orthopedics for the hip and knee.  Continue follow-up with Dr. Burman Riis in [place] [facility]. for low back pain.    We again discussed use of weight loss as a primary method for pain relief.  Continue on muscle relaxer.  Patient avoids nonsteroidals secondary to her bariatric surgery.  Labs have been reassuring.    Carotid Art Stenosis:   50% stenosis to the right ICA   100% stenosis to left ICA.  Continue with Cardiology, Interventional Cardiology  Has follow-up scheduled with vascular surgery 01/19/2023.   Will follow.    Right facial nevus  Continue follow-up with derm in  [place].     OSA:  Could not keep appt for sleep study,   Continue with Neurology,   missed last appointment on 08/05/2021.      Return in about 6 months (around 07/01/2023), or if symptoms worsen or fail to improve, for In Person Visit, chronic disease management.Cherly Beach, MD  12/31/2022 15:03     This note was partially generated using MModal Fluency Direct system, and there may be some incorrect words, spellings, and punctuation that were not noted in checking the note before saving.    PDMP is reviewed today.

## 2022-12-31 NOTE — Nursing Note (Signed)
12/31/22 1406   PHQ 9 (follow up)   Little interest or pleasure in doing things. 1   Feeling down, depressed, or hopeless 0   PHQ 2 Total 1   Trouble falling or staying asleep, or sleeping too much. 1   Feeling tired or having little energy 1   Poor appetite or overeating 1   Feeling bad about yourself/ that you are a failure in the past 2 weeks? 0   Trouble concentrating on things in the past 2 weeks? 1   Moving/Speaking slowly or being fidgety or restless  in the past 2 weeks? 1   Thoughts that you would be better off DEAD, or of hurting yourself in some way. 0   If you checked off any problems, how difficult have these problems made it for you to do your work, take care of things at home, or get along with other people? Not difficult at all   PHQ 9 Total 6   Interpretation of Total Score Mild depression

## 2023-01-13 ENCOUNTER — Encounter (INDEPENDENT_AMBULATORY_CARE_PROVIDER_SITE_OTHER): Payer: Self-pay

## 2023-01-13 ENCOUNTER — Ambulatory Visit (INDEPENDENT_AMBULATORY_CARE_PROVIDER_SITE_OTHER): Payer: Commercial Managed Care - PPO | Admitting: NURSE PRACTITIONER

## 2023-01-13 ENCOUNTER — Other Ambulatory Visit: Payer: Commercial Managed Care - PPO | Attending: NURSE PRACTITIONER | Admitting: NURSE PRACTITIONER

## 2023-01-13 ENCOUNTER — Other Ambulatory Visit: Payer: Self-pay

## 2023-01-13 ENCOUNTER — Encounter: Payer: Self-pay | Admitting: Gastroenterology

## 2023-01-13 ENCOUNTER — Ambulatory Visit (INDEPENDENT_AMBULATORY_CARE_PROVIDER_SITE_OTHER): Payer: 59 | Admitting: Gastroenterology

## 2023-01-13 VITALS — BP 107/71 | HR 71 | Temp 98.1°F | Ht 66.0 in | Wt 156.6 lb

## 2023-01-13 VITALS — BP 124/78 | HR 96 | Temp 96.8°F | Resp 18 | Ht 67.0 in | Wt 205.0 lb

## 2023-01-13 DIAGNOSIS — R109 Unspecified abdominal pain: Secondary | ICD-10-CM | POA: Diagnosis not present

## 2023-01-13 DIAGNOSIS — G8929 Other chronic pain: Secondary | ICD-10-CM

## 2023-01-13 DIAGNOSIS — K219 Gastro-esophageal reflux disease without esophagitis: Secondary | ICD-10-CM

## 2023-01-13 DIAGNOSIS — F17210 Nicotine dependence, cigarettes, uncomplicated: Secondary | ICD-10-CM

## 2023-01-13 DIAGNOSIS — J988 Other specified respiratory disorders: Secondary | ICD-10-CM

## 2023-01-13 DIAGNOSIS — Z1152 Encounter for screening for COVID-19: Secondary | ICD-10-CM | POA: Insufficient documentation

## 2023-01-13 LAB — COVID-19, FLU A/B, RSV RAPID BY PCR
INFLUENZA VIRUS TYPE A: NOT DETECTED
INFLUENZA VIRUS TYPE B: NOT DETECTED
RESPIRATORY SYNCTIAL VIRUS (RSV): NOT DETECTED
SARS-CoV-2: NOT DETECTED

## 2023-01-13 NOTE — Progress Notes (Signed)
Gastroenterology Office Note     Primary Care Physician:  Caryl Bis, MD  Primary Gastroenterologist: Dr. Abbey Chatters   Chief Complaint   Chief Complaint  Patient presents with   Follow-up    Follow up from endoscopy, GERD     History of Present Illness   Ann [name] is a 57 y.o. female presenting today in follow-up with a history of  chronic abdominal pain, chronic intermittent diarrhea, prior metal biliary stent placed in 2012 due to strictured CBD, multiple previous ERCPs outside facility after bile leak s/p cholecystectomy, last ERCP in July 2021 at [facility] with biliary stent pull/balloon sweep with stone removal, dilation. Has seen multiple GI practices. See below for prior evaluations.     Chronic abdominal pain noted. Noted alternating diarrhea/constipation in the past. Severe GERD at last visit. Creon without help historically.. Bentyl not helpful in past. Anti-spasmodics without help in past. Prescribed Xifaxan at last visit but insurance didn't approve. We were going to trial Elavil, but patient declined. She had been at [facility] but lost insurance. Zenpep has offered improved in bloating and bowel habits. . Aciphex BID. GERD much improved. Does not feel like abdominal pain is better. Feels like something in her left side feels it gets caught and has to push on left side to get in place. Occurs regularly. Vit D deficient. Declining PT referral for abdominal wall pain. Feels like pain is worse with eating. Pain will put her in a ball. States she has done celiac plexus blocks before as well. Pain always underlying but waxes and wanes. Severe episodes will occur twice a week. Has tried gabapentin. Notes foul breath.   EGD in interim from last visit Dec 2023: gastritis, overall unrevealing. CT Oct 2023 without acute findings.    Prior evaluation: Starting in 2010 after cholecystectomy: She had a bile leak s/p cholecystectomy, requiring ERCP with stent  placement for biliary stricture. She reports multiple ERCPs at outside facility with multiple stents placed. She notes that each time stent would be removed, she would develop abdominal pain. She reports having "8-10 ERCPs". Liver biopsy by Dr. Doyle Askew 2011: non-specific findings. Looking back at LFTs, she has had bumps in transaminases from the upper 200s to 400/600, with bilirubin ranging from 1-3.  2013 had fluctuating transaminases as well but less severe. During hospitalization in [facility] Dec 2015 for acute pancreatitis, peak AST 243, ALT 206, bilirubin normal at 1. A,lk phos 342, improving with supportive care. 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      ERCP in 2012 by Dr. Christoper Fabian with moderate stricture of the distal CBD with moderate dilatation of CBD, common hepatic duct, and intrahepatic biliary tree. In Aug 2012, a fully covered metal wall stent (10X 40 mm) was placed. Brushings around 2012 negative for malignancy. When seen by Dr. Jerene Pitch at [facility] in 2013, question of secondary sclerosing cholangitis. EGD/EUS March 2013 normal. Recommended stent removal at that time, but she declined due to fear that pain would recur. She was inpatient in [place] and was assessed by Dr. Paulita Fujita in 2015 with abdominal pain and abnormal CT scan noting thickening of the head of pancreas with non-pathologic sized peripancreatic adenopathy. Felt she may have acute on chronic pancreatitis at that time. She was to follow-up with Dr. Paulita Fujita Jan 2016 but did not do this. CA 19-9 low in 2015   [facility] July 2021. ERCP with biliary stent pull/balloon sweep with stones remove, dilated with 12 mm balloon.     Past Medical History:  Diagnosis Date   Anxiety    Arthritis    Chronic back pain    Complication of anesthesia    pt states, "i have migraine and high BP after surgery.".   Depression     GERD (gastroesophageal reflux disease)    Migraines    Pancreatitis     Past Surgical History:  Procedure Laterality Date   BILE DUCT STENT PLACEMENT  07/2011   Dr. Patrick North: fully covered metal wall stent placed.    BIOPSY  12/05/2022   Procedure: BIOPSY;  Surgeon: Eloise Harman, DO;  [facility];  Service: Endoscopy;;   CHOLECYSTECTOMY  2010   with bile leak   COLONOSCOPY  2011   [facility]   COLONOSCOPY WITH PROPOFOL N/A 03/02/2018   two 2-3 mm hyperplastic polyps, torturous left colon, mild diverticulosis in rectosigmoid, internal hemorrhoids, colonic biopsies negative   ERCP  2011-2013   multiple, with multiple stent placements/exchanges. Metal wall stent placed in Aug 2012 AND STILL PRESENT AS OF 2019   ESOPHAGOGASTRODUODENOSCOPY (EGD) WITH PROPOFOL N/A 03/02/2018   dysphagia due to benign-appearing esophageal stricture/GERD, s/p dilation, small hiatal hernia, gastritis.    ESOPHAGOGASTRODUODENOSCOPY (EGD) WITH PROPOFOL N/A 12/05/2022   gastritis, overall unrevealing.   SAVORY DILATION N/A 03/02/2018   Procedure: SAVORY DILATION;  Surgeon: Danie Binder, MD;  Location: [facility];  Service: Endoscopy;  Laterality: N/A;    Current Outpatient Medications  Medication Sig Dispense Refill   ALPRAZolam (XANAX) 1 MG tablet Take 0.5-1 mg by mouth 2 (two) times daily as needed for anxiety or sleep.  0   amphetamine-dextroamphetamine (ADDERALL) 20 MG tablet Take 20 mg by mouth every morning. As needed     cholecalciferol (VITAMIN D3) 25 MCG (1000 UNIT) tablet Take 1,000 Units by mouth daily.     Pancrelipase, Lip-Prot-Amyl, (ZENPEP) 40000-126000 units CPEP Take 2 capsules by mouth with breakfast, with lunch, and with evening meal. Take 1 capsule with snacks. No more than 8 capsules per day. 240 capsule 5   RABEprazole (ACIPHEX) 20 MG tablet Take 1 tablet (20 mg total) by mouth 2 (two) times daily before a meal. 60 tablet 5   Rimegepant Sulfate (NURTEC) 75  MG TBDP Take by mouth.     No current facility-administered medications for this visit.    Allergies as of 01/13/2023 - Review Complete 01/13/2023  Allergen Reaction Noted   Imitrex [sumatriptan] Anaphylaxis and Rash 06/15/2012   Cymbalta [duloxetine hcl] Diarrhea, Nausea Only, and Other (See Comments) 02/12/2016   Dicyclomine  03/02/2018   Doxycycline Diarrhea 06/15/2012   Gabapentin Other (See Comments) 02/17/2018   Lyrica [pregabalin] Swelling and Other (See Comments) 06/15/2012   Nucynta [tapentadol]  05/20/2015   Nuvigil [armodafinil] Other (See Comments) 02/12/2016   Other  06/06/2015   Penicillins Other (See Comments) 02/12/2016   Tape  07/24/2019   Amoxicillin-pot clavulanate Diarrhea and Rash 06/15/2012   Butrans [buprenorphine] Hives, Swelling, and Rash 06/15/2012   Ciprofloxacin Diarrhea, Nausea And Vomiting, and Other (See Comments) 02/12/2016    Family History  Problem Relation Age of Onset   Cervical cancer Mother    Heart attack Other    Migraines Neg Hx    Seizures Neg Hx    Dementia Neg Hx    Colon cancer Neg Hx     Social History   Socioeconomic History  Marital status: Married    Spouse name: Shanon Brow   Number of children: 2   Years of education: 11   Highest education level: Not on file  Occupational History   Occupation: Workers comp  Tobacco Use   Smoking status: Former    Packs/day: 0.25    Years: 10.00    Total pack years: 2.50    Types: Cigarettes    Quit date: 12/09/2011    Years since quitting: 11.1   Smokeless tobacco: Never  Vaping Use   Vaping Use: Never used  Substance and Sexual Activity   Alcohol use: No   Drug use: No   Sexual activity: Yes    Birth control/protection: Post-menopausal  Other Topics Concern   Not on file  Social History Narrative   Lives w/ husband   Caffeine use:  none   Social Determinants of Health   Financial Resource Strain: Not on file  Food Insecurity: Not on file  Transportation Needs: Not on  file  Physical Activity: Not on file  Stress: Not on file  Social Connections: Not on file  Intimate Partner Violence: Not on file     Review of Systems   Gen: Denies any fever, chills, fatigue, weight loss, lack of appetite.  CV: Denies chest pain, heart palpitations, peripheral edema, syncope.  Resp: Denies shortness of breath at rest or with exertion. Denies wheezing or cough.  GI: Denies dysphagia or odynophagia. Denies jaundice, hematemesis, fecal incontinence. GU : Denies urinary burning, urinary frequency, urinary hesitancy MS: Denies joint pain, muscle weakness, cramps, or limitation of movement.  Derm: Denies rash, itching, dry skin Psych: Denies depression, anxiety, memory loss, and confusion Heme: Denies bruising, bleeding, and enlarged lymph nodes.   Physical Exam   BP 107/71   Pulse 71   Temp 98.1 F (36.7 C)   Ht 5' 6"$  (1.676 m)   Wt 156 lb 9.6 oz (71 kg)   LMP 05/16/2012   BMI 25.28 kg/m  General:   Alert and oriented. Pleasant and cooperative. Well-nourished and well-developed.  Head:  Normocephalic and atraumatic. Eyes:  Without icterus Abdomen:  +BS, soft, TTP epigastric. and non-distended. No HSM noted. No guarding or rebound. No masses appreciated. Negative Carnett's sign Rectal:  Deferred  Msk:  Symmetrical without gross deformities. Normal posture. Extremities:  Without edema. Neurologic:  Alert and  oriented x4;  grossly normal neurologically. Skin:  Intact without significant lesions or rashes. Psych:  Alert and cooperative. Normal mood and affect.   Assessment   Ann [name] is a 57 y.o. female presenting today in follow-up with a history o chronic abdominal pain, chronic intermittent diarrhea, prior metal biliary stent placed in 2012 due to strictured CBD, multiple previous ERCPs outside facility after bile leak s/p cholecystectomy, last ERCP in July 2021 at [facility] with biliary stent pull/balloon sweep with stone removal, dilation. Prior  evals outlined above.  GERD: improved on Aciphex BID. EGD on file from Dec 2023 with gastritis.   Abdominal pain: chronic. Feels this is worsening and exacerbated by eating. Gallbladder absent. Labs have been unrevealing. No significant weight loss. Doubt dealing with chronic mesenteric ischemia, but she has had thorough evaluation thus far. Will order CTA. IBS likely contributing. Trial of Xifaxan as well. Zenpep has helped somewhat, compared to Creon, which was not helpful. Declining PT evaluation or tertiary eval.     PLAN    CTA Xifaxan samples ACiphex BID Continue zenpep Further recommendations to follow    Annitta Needs,  PhD, ANP-BC [facility]

## 2023-01-13 NOTE — Patient Instructions (Signed)
I am ordering a specialized CT.  I will be checking on Xifaxan samples!  Continue Zenpep.  Further recommendations to follow!  I enjoyed seeing you again today! At our first visit, I mentioned how I value our relationship and want to provide genuine, compassionate, and quality care. You may receive a survey regarding your visit with me, and I welcome your feedback! Thanks so much for taking the time to complete this. I look forward to seeing you again.   Annitta Needs, PhD, ANP-BC [facility] Gastroenterology

## 2023-01-13 NOTE — Progress Notes (Signed)
[name]    Progress Note    Name: Monique [name] MRN:  [id number]   Date: 01/13/2023 Age: 57 y.o.         Reason for Visit: Constipation (Was seen at the [facility] ED and was diagnosed with constipation last night), Runny Nose, Ear Pain (Left ear pain), and Headache    Nursing Notes:  There are no exam notes on file for this visit.    History of Present Illness  Monique [name] is a 57 y.o. female who is being seen today for above symptoms which began 2 days ago. Left ear has mainly been itchy but no significant discomfort.   Patient reports she went to [facility] ED for abdominal pain 2 days ago and CT Scan, labwork and urinalysis was done and normal except constipation. She reports that she is now having some bowel movements after taking stool softener.   She reports that she is going to see PCP tomorrow for the GI symptoms/constipation.     Past Medical History:   Diagnosis Date    Anxiety     Arthritis     Back problem     DDD    Beta blocker prescribed for left ventricular systolic dysfunction     Blood thinned due to long-term anticoagulant use     Carotid stenosis     occlusion of left ICA    Chest pain 04/03/2018    Chronic back pain     Chronic obstructive pulmonary disease, unspecified COPD type (CMS [place]) 12/31/2022    Coronary artery disease involving native coronary artery of native heart     DDD (degenerative disc disease), lumbar     "entire spine"    Ear piercing     GERD (gastroesophageal reflux disease)     controlled with omeprazole    H/O complete eye exam     2 years, Dr. 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 [place] ordered.     Stented coronary artery     x4    Stroke (CMS [facility])     2002    Tattoo     Left breast, Right shoulder    Thyroid disorder     Thyroid nodule     Type 2 diabetes mellitus (CMS [facility])     HGA1C 10.4 on 12/19/21    Uncontrolled type 2 diabetes mellitus, without long-term current use of insulin     Xanthelasma          Past Surgical History:   Procedure Laterality Date    ENDOMETRIAL ABLATION  1998    HX ANKLE FRACTURE [place]    Right, Doran Durand procedure    HX CATARACT REMOVAL Bilateral 2013    r and l eyes with implants    HX CESAREAN SECTION  06/20/2000    x3, 11/21/86, 11/10/84    HX CHOLECYSTECTOMY  1988    HX CORONARY ARTERY BYPASS GRAFT  03/31/2011    cardiac, 2 vessel, Dr. [name], [facility]    HX CORONARY STENT PLACEMENT  2012    x4 stents    HX GASTRIC SLEEVE  02/2017    [place], [place], Dr. [name] HEART CATHETERIZATION  2012    massive heart attack and stents - [facility]    HX THYROID BIOPSY      HX THYROIDECTOMY  101/01/2018    HX TONSILLECTOMY      as child    HX YAG Left 09/20/2013         Current Outpatient Medications   Medication Sig    aspirin (ECOTRIN) 81 mg Oral Tablet, Delayed Release (E.C.) Take 1 Tablet (81 mg total) by mouth Once a day    Blood Sugar Diagnostic (ACCU-CHEK GUIDE TEST STRIPS) Does not apply Strip TEST BLOOD SUGAR THREE TIMES DAILY    Blood-Glucose Meter (ACCU-CHEK AVIVA PLUS METER) Misc 1 Kit Three times a day    clopidogreL (PLAVIX) 75 mg Oral Tablet TAKE 1 TABLET EVERY DAY    cyanocobalamin (VITAMIN B12) 1,000 mcg/mL Injection Solution INJECT 1 ML (1,000 MCG TOTAL) UNDER THE SKIN EVERY 30 DAYS    diclofenac sodium (VOLTAREN) 1 % Gel Apply 2 g topically Three times a day as needed    docusate sodium (COLACE) 100 mg Oral Capsule TAKE 1 CAPSULE TWICE DAILY    empagliflozin (JARDIANCE) 10 mg Oral Tablet Take 1 Tablet (10 mg total) by mouth Once a day     ergocalciferol, vitamin D2, (DRISDOL) 1,250 mcg (50,000 unit) Oral Capsule TAKE 1 CAPSULE BY MOUTH EVERY WEEK    fluticasone propionate (FLONASE) 50 mcg/actuation Nasal Spray, Suspension Administer 2 Sprays into each nostril Once a day    furosemide (LASIX) 40 mg Oral Tablet TAKE 1 TABLET EVERY DAY    HYDROcodone-acetaminophen (NORCO) 10-325 mg Oral Tablet Take 1 Tablet by mouth Every 8 hours as needed for Pain [name] with [facility]    Lancets (ACCU-CHEK SOFTCLIX LANCETS) Misc To use daily    levothyroxine (SYNTHROID) 25 mcg Oral Tablet TAKE 1 TABLET ONE TIME DAILY    metoprolol succinate (TOPROL-XL) 25 mg Oral Tablet Sustained Release 24 hr Take 0.5 Tablets (12.5 mg total) by mouth Once a day    NARCAN 4 mg/actuation Nasal Spray, Non-Aerosol PT STATED SHE HAS NARCAN AT HOME BUT IS NOT TAKING IT    Needle, Disp, 25 G 25 gauge x 1" Needle 1 mL Every 30 days    niacin (NIASPAN) 500 mg Oral Tablet Sustained Release 24 hr TAKE 1 TABLET EVERY DAY    nitroGLYCERIN (NITROSTAT) 0.4 mg Sublingual Tablet, Sublingual Place 1 Tablet (0.4 mg total) under the tongue Every 5 minutes as needed for Chest pain for 3 doses over 15 minutes    omeprazole (PRILOSEC) 40 mg Oral Capsule, Delayed Release(E.C.) TAKE 1 CAPSULE EVERY DAY    ondansetron (ZOFRAN ODT) 8 mg Oral Tablet, Rapid Dissolve DISSOLVE 1 TABLET ON THE TONGUE EVERY 8 HOURS AS NEEDED FOR NAUSEA OR VOMITING    OZEMPIC 2 mg/dose (8 mg/3 mL) Subcutaneous Pen Injector Inject 2 mg under the skin Every 7 days    PRENATAL PLUS, CALCIUM CARB, 27 mg iron- 1 mg Oral Tablet Take 1 Tablet by mouth Once a day    rosuvastatin (CRESTOR) 40 mg Oral Tablet Take 1 Tablet (  40 mg total) by mouth Once a day TAKE 1 TABLET BY MOUTH ONCE DAILY    sennosides-docusate sodium (SENOKOT-S) 8.6-50 mg Oral Tablet Take 1 Tablet by mouth Every evening    venlafaxine (EFFEXOR XR) 37.5 mg Oral Capsule, Sust. Release 24 hr TAKE 1 CAPSULE ONE TIME DAILY    ZYRTEC-D 5-120 mg Oral Tablet  Sustained Release 12 hr Take 1 Tablet by mouth Once a day     Allergies   Allergen Reactions    Capron Dm [Pyrilamine-Dextromethorphan]  Other Adverse Reaction (Add comment)     LIGHT HEADED    Other      Coban dressing caused skin break down    Tylenol W Codeine [Acetaminophen-Codeine]  Other Adverse Reaction (Add comment)     pt states that one time she had chest pains with this med, but dr. thought it was because she had not eaten     Family Medical History:       Problem Relation (Age of Onset)    Atrial fibrillation Sister    Colon Cancer Father    Diabetes Mother, Maternal Aunt    Heart Disease Sister, Maternal Grandfather    Hypertension (High Blood Pressure) Mother    Lung Cancer Maternal Grandfather    MI <46 years of age Mother            Social History     Tobacco Use    Smoking status: Every Day     Packs/day: 0.50     Years: 20.00     Additional pack years: 0.00     Total pack years: 10.00     Types: Cigarettes     Start date: 89    Smokeless tobacco: Never   Vaping Use    Vaping Use: Never used   Substance Use Topics    Alcohol use: No    Drug use: No       Review of Systems  Review of Systems   Constitutional:  Negative for chills and fever.   HENT:  Positive for congestion and ear pain. Negative for sore throat.    Eyes:  Negative for blurred vision and double vision.   Respiratory:  Negative for cough and shortness of breath.    Cardiovascular:  Negative for chest pain.   Gastrointestinal:         See HPI   Neurological:  Positive for headaches. Negative for dizziness, sensory change, speech change and focal weakness.       Physical Exam:  BP 124/78   Pulse 96   Temp 36 C (96.8 F)   Resp 18   Ht 1.702 m ('5\' 7"'$ )   Wt 93 kg (205 lb)   LMP  (LMP Unknown)   SpO2 96%   BMI 32.11 kg/m       Physical Exam  HENT:      Right Ear: Tympanic membrane normal.      Left Ear: Tympanic membrane normal.      Nose: Congestion present.      Mouth/Throat:      Mouth: Mucous membranes are moist.       Pharynx: No posterior oropharyngeal erythema.   Cardiovascular:      Rate and Rhythm: Normal rate and regular rhythm.   Pulmonary:      Effort: Pulmonary effort is normal.      Breath sounds: Normal breath sounds.   Abdominal:      General: Bowel sounds are normal. There is no distension.  Palpations: Abdomen is soft.      Tenderness: There is no abdominal tenderness. There is no guarding.   Skin:     General: Skin is warm and dry.      Capillary Refill: Capillary refill takes less than 2 seconds.   Neurological:      General: No focal deficit present.      Mental Status: She is alert and oriented to person, place, and time.      Sensory: No sensory deficit.      Gait: Gait normal.         Assessment:    Respiratory infection       Plan:    Patient Instructions   Follow up instructions:  Monitor for fever  Avoid respiratory irritants/allergens when possible.  Notify clinic if symptoms not improving next 2 days  You should follow up with PCP tomorrow as you have planned.   Practice infection control measures to prevent spread of infection.   Assure adequate rest.  I would recommend Zyrtec '10mg'$  daily next 7 days.   We will call you with results of Covid-19, RSV and Influenza when available.       Orders Placed This Encounter    COVID-19, FLU A/B, RSV RAPID BY PCR        MDM:      During the patient's visit at urgent care patient's HPI, ROS and physical exam were completed to assist in medical decision making.    Medications were reviewed and reconciled. Medication instructions and side effects were discussed.  Advised to seek immediate medical care with any new, worsening, or concerning symptoms.  Opportunity to ask questions was provided and all questions were answered.  Discussed diagnosis and management including indications for return, importance of close follow up and supportive care measures prior to patient discharge.       Thomasenia Bottoms, FNP-BC  01/13/2023, 16:39

## 2023-01-13 NOTE — Patient Instructions (Addendum)
Follow up instructions:  Monitor for fever  Avoid respiratory irritants/allergens when possible.  Notify clinic if symptoms not improving next 2 days  You should follow up with PCP tomorrow as you have planned.   Practice infection control measures to prevent spread of infection.   Assure adequate rest.  I would recommend Zyrtec '10mg'$  daily next 7 days.   We will call you with results of Covid-19, RSV and Influenza when available.

## 2023-01-14 ENCOUNTER — Encounter (INDEPENDENT_AMBULATORY_CARE_PROVIDER_SITE_OTHER): Payer: Self-pay

## 2023-01-14 ENCOUNTER — Other Ambulatory Visit (INDEPENDENT_AMBULATORY_CARE_PROVIDER_SITE_OTHER): Payer: Self-pay | Admitting: Family Medicine

## 2023-01-14 DIAGNOSIS — R109 Unspecified abdominal pain: Secondary | ICD-10-CM

## 2023-01-14 DIAGNOSIS — J40 Bronchitis, not specified as acute or chronic: Secondary | ICD-10-CM

## 2023-01-14 MED ORDER — AZITHROMYCIN 250 MG TABLET
ORAL_TABLET | ORAL | 0 refills | Status: DC
Start: 2023-01-14 — End: 2023-02-18

## 2023-01-15 ENCOUNTER — Telehealth: Payer: Self-pay | Admitting: Gastroenterology

## 2023-01-15 NOTE — Telephone Encounter (Signed)
Samples at front window. Left message to return call.

## 2023-01-15 NOTE — Telephone Encounter (Signed)
Left message to return call.  

## 2023-01-15 NOTE — Telephone Encounter (Signed)
We have exactly # 42. I called Ann [name] to see if he could bring some by next week.

## 2023-01-15 NOTE — Telephone Encounter (Signed)
Does [address] have enough Xifaxan samples for a 14 day supply of TID dosing? (#42).   If so, can we have some for this patient/ Thanks !

## 2023-01-16 ENCOUNTER — Encounter (INDEPENDENT_AMBULATORY_CARE_PROVIDER_SITE_OTHER): Payer: Self-pay | Admitting: *Deleted

## 2023-01-16 NOTE — Telephone Encounter (Signed)
Patient picked up medication from the front desk 01/16/2023 at 8:46 am.

## 2023-01-16 NOTE — Telephone Encounter (Signed)
Patient called back and left vm that she works 8-5 and cannot answer phone at work. She asked to send her a message through [place]. Mychart message sent to patient letting her know samples are ready for pickup at [address] location.

## 2023-01-19 ENCOUNTER — Other Ambulatory Visit: Payer: Self-pay

## 2023-01-19 ENCOUNTER — Other Ambulatory Visit: Payer: Commercial Managed Care - PPO | Attending: Family

## 2023-01-19 ENCOUNTER — Encounter (INDEPENDENT_AMBULATORY_CARE_PROVIDER_SITE_OTHER): Payer: Self-pay | Admitting: Family

## 2023-01-19 ENCOUNTER — Ambulatory Visit (INDEPENDENT_AMBULATORY_CARE_PROVIDER_SITE_OTHER): Payer: Commercial Managed Care - PPO | Admitting: PHYSICIAN ASSISTANT

## 2023-01-19 VITALS — BP 130/60 | Ht 67.0 in | Wt 211.4 lb

## 2023-01-19 DIAGNOSIS — I6523 Occlusion and stenosis of bilateral carotid arteries: Secondary | ICD-10-CM

## 2023-01-19 NOTE — Progress Notes (Cosign Needed)
[place]  [place] [id number]    Name: Monique [name] MRN:  [id number]   Date: 01/19/2023 Age: 57 y.o.     Referring Provider: Cherly Beach    Chief Complaint: Follow-up After Testing (Annual follow up ultrasound carotids today)    History of Present Illness:   I had the pleasure of seeing Monique [name] in follow up for carotid stenosis.     Patient has a history of TIA's dating back to 2009 and a known left ICA occlusion since 2012. She remains asymptomatic and denies any focal neurosensory motor deficits, dysarthria, amaurosis, or other signs or symptoms of TIA or stroke.     The patient has no new complaints or concerns from a vascular perspective at this point in time.    Past Surgical History:   Procedure Laterality Date    ENDOMETRIAL ABLATION  1998    HX ANKLE FRACTURE TX  1990s    Right, Doran Durand procedure    HX CATARACT REMOVAL Bilateral 2013    r and l eyes with implants    HX CESAREAN SECTION  06/20/2000    x3, 11/21/86, 11/10/84    HX CHOLECYSTECTOMY  1988    HX CORONARY ARTERY BYPASS GRAFT  03/31/2011    cardiac, 2 vessel, Dr. [name], [facility]    HX CORONARY STENT PLACEMENT  2012    x4 stents    HX GASTRIC SLEEVE  02/2017    [place], [place], Dr. [name] HEART CATHETERIZATION  2012    massive heart attack and stents - [facility]    HX THYROID BIOPSY      HX THYROIDECTOMY  101/01/2018    HX TONSILLECTOMY      as child    HX YAG Left 09/20/2013         Allergies   Allergen Reactions    [place]ron Dm [Pyrilamine-Dextromethorphan]  Other Adverse Reaction (Add comment)     LIGHT HEADED    Other      Coban dressing caused skin break down    Tylenol W Codeine [Acetaminophen-Codeine]  Other Adverse Reaction (Add comment)     pt states that one time she had chest pains with this med, but dr. thought it was because she had not eaten     Patient Active Problem List    Diagnosis    Atherosclerosis of native artery of  right lower extremity with rest pain (CMS [facility])    Chronic obstructive pulmonary disease, unspecified COPD type (CMS [place])    Unspecified inflammatory spondylopathy, lumbar region (CMS [facility])    Other specified disorders of adrenal gland (CMS [place])    Left leg weakness    Dizziness    Chronic lumbar radiculopathy    Hypertension associated with type 2 diabetes mellitus (CMS [place])     Type 2 diabetes mellitus with complication, without long-term current use of insulin (CMS [facility])    Major depressive disorder, recurrent, unspecified (CMS [facility])    Peripheral vascular disease, unspecified (CMS [place])    Fracture of phalanx of finger of right hand    Spinal pain    Type 2 diabetes mellitus with hyperglycemia (CMS [place])    Upper respiratory infection    Thyroid disorder    UDS: 07/28/19    Sinus congestion    Morbid obesity (CMS [facility])    Type 2 diabetes mellitus with hyperosmolarity without coma, without long-term current use  of insulin (CMS [facility])    Coronary artery disease involving native coronary artery of native heart    GERD (gastroesophageal reflux disease)    HTN (hypertension)    Hyperlipidemia    Constipation    Thyroid nodule    Chest pain    DDD (degenerative disc disease), lumbar    Anxiety    Chronic back pain    Carotid stenosis    BMI 32.0-32.9,adult    H/O complete eye exam    S/P CABG x 2    Xanthelasma    Hypercholesterolemia     Past Medical History:   Diagnosis Date    Anxiety     Arthritis     Back problem     DDD    Beta blocker prescribed for left ventricular systolic dysfunction     Blood thinned due to long-term anticoagulant use     Carotid stenosis     occlusion of left ICA    Chest pain 04/03/2018    Chronic back pain     Chronic obstructive pulmonary disease, unspecified COPD type (CMS [place]) 12/31/2022    Coronary artery disease involving native coronary artery of native heart     DDD (degenerative disc disease), lumbar     "entire spine"    Ear piercing     GERD (gastroesophageal reflux disease)      controlled with omeprazole    H/O complete eye exam     2 years, Dr. [name], at [facility]    History of dental examination     dentures upper and lower    HTN     Hx of coronary artery bypass graft     2012    Hx of gastric bypass     Hypercholesterolemia     Hyperlipidemia     Hypothyroid     MI (myocardial infarction) (CMS [place]) 2012    Neck problem     herniated cervical disc    Obesity (BMI 30-39.9)     Peripheral vascular disease, unspecified (CMS [place]) 01/03/2021    Solitary nodule of right lobe of thyroid 03/30/2018    Incidental finding on MRI thoracic spine. [place] ordered.     Stented coronary artery     x4    Stroke (CMS [facility])     2002    [place]     Left breast, Right shoulder    Thyroid disorder     Thyroid nodule     Type 2 diabetes mellitus (CMS [facility])     HGA1C 10.4 on 12/19/21    Uncontrolled type 2 diabetes mellitus, without long-term current use of insulin     Xanthelasma          Social History     Tobacco Use    Smoking status: Every Day     Packs/day: 0.50     Years: 20.00     Additional pack years: 0.00     Total pack years: 10.00     Types: Cigarettes     Start date: 10    Smokeless tobacco: Never   Substance Use Topics    Alcohol use: No      Current Outpatient Medications   Medication Sig    aspirin (ECOTRIN) 81 mg Oral Tablet, Delayed Release (E.C.) Take 1 Tablet (81 mg total) by mouth Once a day    azithromycin (ZITHROMAX) 250 mg Oral Tablet Take 500 mg (2 tab) on day 1; take 250 mg (1 tab) on days 2-5.  Blood Sugar Diagnostic (ACCU-CHEK GUIDE TEST STRIPS) Does not apply Strip TEST BLOOD SUGAR THREE TIMES DAILY    Blood-Glucose Meter (ACCU-CHEK AVIVA PLUS METER) Misc 1 Kit Three times a day    clopidogreL (PLAVIX) 75 mg Oral Tablet TAKE 1 TABLET EVERY DAY    cyanocobalamin (VITAMIN B12) 1,000 mcg/mL Injection Solution INJECT 1 ML (1,000 MCG TOTAL) UNDER THE SKIN EVERY 30 DAYS    diclofenac sodium (VOLTAREN) 1 % Gel Apply 2 g topically Three times a day as needed    docusate sodium (COLACE) 100 mg Oral  Capsule TAKE 1 CAPSULE TWICE DAILY    empagliflozin (JARDIANCE) 10 mg Oral Tablet Take 1 Tablet (10 mg total) by mouth Once a day    ergocalciferol, vitamin D2, (DRISDOL) 1,250 mcg (50,000 unit) Oral Capsule TAKE 1 CAPSULE BY MOUTH EVERY WEEK    fluticasone propionate (FLONASE) 50 mcg/actuation Nasal Spray, Suspension Administer 2 Sprays into each nostril Once a day    furosemide (LASIX) 40 mg Oral Tablet TAKE 1 TABLET EVERY DAY    HYDROcodone-acetaminophen (NORCO) 10-325 mg Oral Tablet Take 1 Tablet by mouth Every 8 hours as needed for Pain [name] with [facility]    Lancets (ACCU-CHEK SOFTCLIX LANCETS) Misc To use daily    levothyroxine (SYNTHROID) 25 mcg Oral Tablet TAKE 1 TABLET ONE TIME DAILY    metoprolol succinate (TOPROL-XL) 25 mg Oral Tablet Sustained Release 24 hr Take 0.5 Tablets (12.5 mg total) by mouth Once a day    NARCAN 4 mg/actuation Nasal Spray, Non-Aerosol PT STATED SHE HAS NARCAN AT HOME BUT IS NOT TAKING IT    Needle, Disp, 25 G 25 gauge x 1" Needle 1 mL Every 30 days    niacin (NIASPAN) 500 mg Oral Tablet Sustained Release 24 hr TAKE 1 TABLET EVERY DAY    nitroGLYCERIN (NITROSTAT) 0.4 mg Sublingual Tablet, Sublingual Place 1 Tablet (0.4 mg total) under the tongue Every 5 minutes as needed for Chest pain for 3 doses over 15 minutes    omeprazole (PRILOSEC) 40 mg Oral Capsule, Delayed Release(E.C.) TAKE 1 CAPSULE EVERY DAY    ondansetron (ZOFRAN ODT) 8 mg Oral Tablet, Rapid Dissolve DISSOLVE 1 TABLET ON THE TONGUE EVERY 8 HOURS AS NEEDED FOR NAUSEA OR VOMITING    OZEMPIC 2 mg/dose (8 mg/3 mL) Subcutaneous Pen Injector Inject 2 mg under the skin Every 7 days    PRENATAL PLUS, CALCIUM CARB, 27 mg iron- 1 mg Oral Tablet Take 1 Tablet by mouth Once a day    rosuvastatin (CRESTOR) 40 mg Oral Tablet Take 1 Tablet (40 mg total) by mouth Once a day TAKE 1 TABLET BY MOUTH ONCE DAILY    sennosides-docusate sodium (SENOKOT-S) 8.6-50 mg Oral Tablet Take 1 Tablet by mouth Every evening     venlafaxine (EFFEXOR XR) 37.5 mg Oral Capsule, Sust. Release 24 hr TAKE 1 CAPSULE ONE TIME DAILY    ZYRTEC-D 5-120 mg Oral Tablet Sustained Release 12 hr Take 1 Tablet by mouth Once a day     Family Medical History:       Problem Relation (Age of Onset)    Atrial fibrillation Sister    Colon Cancer Father    Diabetes Mother, Maternal Aunt    Heart Disease Sister, Maternal Grandfather    Hypertension (High Blood Pressure) Mother    Lung Cancer Maternal Grandfather    MI <69 years of age Mother            Review of Systems:  Normal except as otherwise noted in the HPI.    Physical Examination:  BP 130/60 (Site: Non-Invasive BP, Patient Position: Sitting, Cuff Size: Adult) Comment: left arm Comment (Site): right arm 133/69  Ht 1.702 m (5' 7"$ )   Wt 95.9 kg (211 lb 6.4 oz)   LMP  (LMP Unknown)   BMI 33.11 kg/m   GENERAL: AOx3. Does not appear in any acute distress.   HEENT: PERRL. Neck soft and supple. Trachea midline. No carotid bruit appreciated.   NEURO: CNII - CNXII grossly intact. No motor or sensory deficits.   HEART: NSR.   LUNGS: CTAB.   ABDOMEN: Soft and non-tender. No palpable mass or bruit.   VASCULAR: Distal extremities are warm and well perfused. No edema appreciated. Radial and PT pulses palpable.     DIAGNOSIS:  Problem List Items Addressed This Visit          Neurologic    Carotid stenosis - Primary    Relevant Orders    CAROTID ARTERY DUPLEX     ASSESSMENT & PLAN:  Patient with asymptomatic carotid stenosis, remote history of TIA's and known left ICA occlusion.     Studies reviewed. Carotid duplex reveals known occlusion of the left ICA, mild stenosis of the right ICA measuring less than 50%. These findings are essentially unchanged from the previous exam dated 05/21/21.    Patient is on good medical therapy including aspirin, statin and Plavix. She is unfortunately a current every-day smoker with no interest in cessation. Blood pressure well controlled in the office.    Given the patient's  asymptomatic nature and stability of ultrasound, we recommend follow up in 12 months with a repeat carotid duplex. In the meantime, the patient understands to contact our office with any concerns or questions, and to seek immediate emergency medical treatment for any new or worsening symptoms.      Aletta Edouard, PA-C    I am scribing for, and in the presence of, Aletta Edouard, [place], for services provided on 01/19/2023.  Terri L. Hertz     On the day of the encounter, a total of  20 minutes was spent on this patient encounter including review of historical information, examination, documentation and post-visit activities. The time documented excludes procedural time.    I personally performed the services described in this documentation, as scribed  in my presence, and it is both accurate  and complete.    Aletta Edouard, PA-C

## 2023-01-20 ENCOUNTER — Encounter (INDEPENDENT_AMBULATORY_CARE_PROVIDER_SITE_OTHER): Payer: Commercial Managed Care - PPO | Admitting: Family Medicine

## 2023-01-22 ENCOUNTER — Telehealth (INDEPENDENT_AMBULATORY_CARE_PROVIDER_SITE_OTHER): Payer: Self-pay | Admitting: *Deleted

## 2023-01-22 NOTE — Telephone Encounter (Signed)
PA was approved via [facility] website. Auth# [id number] DOS 01/22/23-03/08/23

## 2023-01-30 ENCOUNTER — Ambulatory Visit (HOSPITAL_BASED_OUTPATIENT_CLINIC_OR_DEPARTMENT_OTHER): Payer: Self-pay | Admitting: Internal Medicine

## 2023-01-30 ENCOUNTER — Other Ambulatory Visit: Payer: Self-pay

## 2023-01-30 ENCOUNTER — Other Ambulatory Visit: Payer: Commercial Managed Care - PPO | Attending: Family Medicine

## 2023-01-30 DIAGNOSIS — E119 Type 2 diabetes mellitus without complications: Secondary | ICD-10-CM | POA: Insufficient documentation

## 2023-01-30 DIAGNOSIS — E785 Hyperlipidemia, unspecified: Secondary | ICD-10-CM

## 2023-01-30 DIAGNOSIS — E7800 Pure hypercholesterolemia, unspecified: Secondary | ICD-10-CM | POA: Insufficient documentation

## 2023-01-30 DIAGNOSIS — Z114 Encounter for screening for human immunodeficiency virus [HIV]: Secondary | ICD-10-CM

## 2023-01-30 DIAGNOSIS — E1165 Type 2 diabetes mellitus with hyperglycemia: Secondary | ICD-10-CM | POA: Insufficient documentation

## 2023-01-30 DIAGNOSIS — Z951 Presence of aortocoronary bypass graft: Secondary | ICD-10-CM | POA: Insufficient documentation

## 2023-01-30 DIAGNOSIS — Z1159 Encounter for screening for other viral diseases: Secondary | ICD-10-CM | POA: Insufficient documentation

## 2023-01-30 DIAGNOSIS — R109 Unspecified abdominal pain: Secondary | ICD-10-CM | POA: Insufficient documentation

## 2023-01-30 LAB — LIPID PANEL
CHOL/HDL RATIO: 6.2
CHOLESTEROL: 272 mg/dL — ABNORMAL HIGH (ref 100–200)
HDL CHOL: 44 mg/dL — ABNORMAL LOW (ref >=50)
LDL CALC: 206 mg/dL — ABNORMAL HIGH (ref <100)
NON-HDL: 228 mg/dL — ABNORMAL HIGH (ref <=190)
TRIGLYCERIDES: 121 mg/dL (ref <150)
VLDL CALC: 25 mg/dL (ref <30)

## 2023-01-30 LAB — HEPATITIS C ANTIBODY SCREEN WITH REFLEX TO HCV PCR: HCV ANTIBODY QUALITATIVE: NEGATIVE

## 2023-01-30 LAB — HIV1/HIV2 SCREEN, COMBINED ANTIGEN AND ANTIBODY: HIV SCREEN, COMBINED ANTIGEN & ANTIBODY: NEGATIVE

## 2023-01-30 LAB — HGA1C (HEMOGLOBIN A1C WITH EST AVG GLUCOSE): HEMOGLOBIN A1C: 10.9 % — ABNORMAL HIGH (ref <5.7)

## 2023-01-30 LAB — LIPASE: LIPASE: 25 U/L (ref 10–60)

## 2023-01-30 NOTE — Telephone Encounter (Signed)
Spoke with patient on the phone about lipid panel results.

## 2023-01-30 NOTE — Result Encounter Note (Signed)
Please inform the patient that her hemoglobin A1c has increased to 10.9% up from 10.4 4% last year.      This level of hypoglycemia is likely the source of her chronic fatigue and muscle pain.    We have a follow-up visit in May but if she would like to be seen sooner than that to change medications I would be happy to offer that to her.      Let her know her pancreatitis, HIV and hepatitis-C screens were all negative.      Her lipid panel showed her total cholesterol as well as her bad cholesterol were elevated.  Her LDL was 206 we would like to see that under 70.  Will need to discuss cholesterol-lowering medications at our follow-up visit.      TY TH

## 2023-01-31 ENCOUNTER — Other Ambulatory Visit: Payer: Commercial Managed Care - PPO | Attending: MULTISPECIALTY CLINIC OR GROUP PRACTICE

## 2023-01-31 DIAGNOSIS — E1165 Type 2 diabetes mellitus with hyperglycemia: Secondary | ICD-10-CM | POA: Insufficient documentation

## 2023-01-31 DIAGNOSIS — E1129 Type 2 diabetes mellitus with other diabetic kidney complication: Secondary | ICD-10-CM | POA: Insufficient documentation

## 2023-01-31 LAB — CORTISOL, PLASMA OR SERUM: CORTISOL: 1 ug/dL — ABNORMAL LOW (ref 7.0–25.0)

## 2023-02-02 ENCOUNTER — Encounter (HOSPITAL_COMMUNITY): Payer: Self-pay | Admitting: Radiology

## 2023-02-02 ENCOUNTER — Ambulatory Visit (HOSPITAL_COMMUNITY)
Admission: RE | Admit: 2023-02-02 | Discharge: 2023-02-02 | Disposition: A | Discharge status: Home / Self Care | Payer: 59 | Source: Ambulatory Visit | Attending: Gastroenterology | Admitting: Gastroenterology

## 2023-02-02 DIAGNOSIS — R109 Unspecified abdominal pain: Secondary | ICD-10-CM | POA: Insufficient documentation

## 2023-02-02 DIAGNOSIS — G8929 Other chronic pain: Secondary | ICD-10-CM | POA: Diagnosis present

## 2023-02-02 MED ORDER — IOHEXOL 350 MG/ML SOLN
100.0000 mL | Freq: Once | INTRAVENOUS | Status: AC | PRN
  Administered 2023-02-02: 100 mL via INTRAVENOUS

## 2023-02-03 LAB — ADRENOCORTICOTROPIC HORMONE: ACTH: <5 pg/mL — ABNORMAL LOW (ref 6.6–65.0)

## 2023-02-09 ENCOUNTER — Other Ambulatory Visit: Payer: Self-pay

## 2023-02-11 ENCOUNTER — Other Ambulatory Visit: Payer: Self-pay

## 2023-02-11 ENCOUNTER — Ambulatory Visit: Payer: 59 | Admitting: Gastroenterology

## 2023-02-12 ENCOUNTER — Encounter (INDEPENDENT_AMBULATORY_CARE_PROVIDER_SITE_OTHER): Payer: Commercial Managed Care - PPO | Admitting: Family Medicine

## 2023-02-12 LAB — DEXAMETHASONE, SERUM: DEXAMETHASONE: 228 ng/dL

## 2023-02-16 ENCOUNTER — Encounter (HOSPITAL_COMMUNITY): Payer: Self-pay

## 2023-02-16 ENCOUNTER — Other Ambulatory Visit: Payer: Self-pay

## 2023-02-16 ENCOUNTER — Emergency Department (HOSPITAL_COMMUNITY): Payer: Commercial Managed Care - PPO | Admitting: Radiology

## 2023-02-16 ENCOUNTER — Inpatient Hospital Stay
Admission: EM | Admit: 2023-02-16 | Discharge: 2023-02-18 | DRG: 641 | Disposition: A | Discharge status: Home / Self Care | Payer: Commercial Managed Care - PPO | Attending: Family Medicine | Admitting: Family Medicine

## 2023-02-16 ENCOUNTER — Encounter (INDEPENDENT_AMBULATORY_CARE_PROVIDER_SITE_OTHER): Payer: Self-pay

## 2023-02-16 ENCOUNTER — Inpatient Hospital Stay (HOSPITAL_COMMUNITY): Payer: Commercial Managed Care - PPO | Admitting: Internal Medicine

## 2023-02-16 DIAGNOSIS — F17200 Nicotine dependence, unspecified, uncomplicated: Secondary | ICD-10-CM | POA: Diagnosis present

## 2023-02-16 DIAGNOSIS — E872 Acidosis, unspecified: Secondary | ICD-10-CM | POA: Diagnosis present

## 2023-02-16 DIAGNOSIS — F339 Major depressive disorder, recurrent, unspecified: Secondary | ICD-10-CM | POA: Diagnosis present

## 2023-02-16 DIAGNOSIS — E1151 Type 2 diabetes mellitus with diabetic peripheral angiopathy without gangrene: Secondary | ICD-10-CM | POA: Diagnosis present

## 2023-02-16 DIAGNOSIS — Z6831 Body mass index (BMI) 31.0-31.9, adult: Secondary | ICD-10-CM

## 2023-02-16 DIAGNOSIS — R531 Weakness: Secondary | ICD-10-CM

## 2023-02-16 DIAGNOSIS — E1165 Type 2 diabetes mellitus with hyperglycemia: Secondary | ICD-10-CM | POA: Diagnosis present

## 2023-02-16 DIAGNOSIS — E1121 Type 2 diabetes mellitus with diabetic nephropathy: Secondary | ICD-10-CM | POA: Diagnosis present

## 2023-02-16 DIAGNOSIS — Z7982 Long term (current) use of aspirin: Secondary | ICD-10-CM

## 2023-02-16 DIAGNOSIS — E860 Dehydration: Principal | ICD-10-CM | POA: Diagnosis present

## 2023-02-16 DIAGNOSIS — Z79891 Long term (current) use of opiate analgesic: Secondary | ICD-10-CM

## 2023-02-16 DIAGNOSIS — Z7984 Long term (current) use of oral hypoglycemic drugs: Secondary | ICD-10-CM

## 2023-02-16 DIAGNOSIS — N179 Acute kidney failure, unspecified: Secondary | ICD-10-CM | POA: Diagnosis present

## 2023-02-16 DIAGNOSIS — I252 Old myocardial infarction: Secondary | ICD-10-CM

## 2023-02-16 DIAGNOSIS — Z7902 Long term (current) use of antithrombotics/antiplatelets: Secondary | ICD-10-CM

## 2023-02-16 DIAGNOSIS — Z79890 Hormone replacement therapy (postmenopausal): Secondary | ICD-10-CM

## 2023-02-16 DIAGNOSIS — E890 Postprocedural hypothyroidism: Secondary | ICD-10-CM | POA: Diagnosis present

## 2023-02-16 DIAGNOSIS — Z951 Presence of aortocoronary bypass graft: Secondary | ICD-10-CM

## 2023-02-16 DIAGNOSIS — I2510 Atherosclerotic heart disease of native coronary artery without angina pectoris: Secondary | ICD-10-CM | POA: Diagnosis present

## 2023-02-16 DIAGNOSIS — F17210 Nicotine dependence, cigarettes, uncomplicated: Secondary | ICD-10-CM | POA: Diagnosis present

## 2023-02-16 DIAGNOSIS — E7800 Pure hypercholesterolemia, unspecified: Secondary | ICD-10-CM | POA: Diagnosis present

## 2023-02-16 DIAGNOSIS — K219 Gastro-esophageal reflux disease without esophagitis: Secondary | ICD-10-CM | POA: Diagnosis present

## 2023-02-16 DIAGNOSIS — Z955 Presence of coronary angioplasty implant and graft: Secondary | ICD-10-CM

## 2023-02-16 DIAGNOSIS — I152 Hypertension secondary to endocrine disorders: Secondary | ICD-10-CM | POA: Diagnosis present

## 2023-02-16 LAB — CBC WITH DIFF
BASOPHIL #: <0.1 10*3/uL (ref <=0.20)
BASOPHIL %: 0 %
EOSINOPHIL #: <0.1 10*3/uL (ref <=0.50)
EOSINOPHIL %: 1 %
HCT: 44.1 % (ref 34.8–46.0)
HGB: 15.3 g/dL (ref 11.5–16.0)
IMMATURE GRANULOCYTE #: <0.1 10*3/uL (ref <0.10)
IMMATURE GRANULOCYTE %: 0 % (ref 0.0–1.0)
LYMPHOCYTE #: 2.66 10*3/uL (ref 1.00–4.80)
LYMPHOCYTE %: 29 %
MCH: 30.1 pg (ref 26.0–32.0)
MCHC: 34.7 g/dL (ref 31.0–35.5)
MCV: 86.8 fL (ref 78.0–100.0)
MONOCYTE #: 0.75 10*3/uL (ref 0.20–1.10)
MONOCYTE %: 8 %
MPV: 9.7 fL (ref 8.7–12.5)
NEUTROPHIL #: 5.63 10*3/uL (ref 1.50–7.70)
NEUTROPHIL %: 62 %
PLATELETS: 347 10*3/uL (ref 150–400)
RBC: 5.08 10*6/uL (ref 3.85–5.22)
RDW-CV: 13.2 % (ref 11.5–15.5)
WBC: 9.2 10*3/uL (ref 3.7–11.0)

## 2023-02-16 LAB — COMPREHENSIVE METABOLIC PANEL, NON-FASTING
ALBUMIN: 3.7 g/dL (ref 3.5–5.0)
ALKALINE PHOSPHATASE: 112 U/L (ref 50–130)
ALT (SGPT): 30 U/L — ABNORMAL HIGH (ref 8–22)
ANION GAP: 17 mmol/L — ABNORMAL HIGH (ref 4–13)
AST (SGOT): 35 U/L (ref 8–45)
BILIRUBIN TOTAL: 0.3 mg/dL (ref 0.3–1.3)
BUN/CREA RATIO: 11 (ref 6–22)
BUN: 14 mg/dL (ref 8–25)
CALCIUM: 9.2 mg/dL (ref 8.6–10.2)
CHLORIDE: 104 mmol/L (ref 96–111)
CO2 TOTAL: 15 mmol/L — ABNORMAL LOW (ref 22–30)
CREATININE: 1.25 mg/dL — ABNORMAL HIGH (ref 0.60–1.05)
ESTIMATED GFR - FEMALE: 50 mL/min/BSA — ABNORMAL LOW (ref >=60)
GLUCOSE: 170 mg/dL — ABNORMAL HIGH (ref 65–125)
POTASSIUM: 3.7 mmol/L (ref 3.5–5.1)
PROTEIN TOTAL: 7.2 g/dL (ref 6.4–8.3)
SODIUM: 136 mmol/L (ref 136–145)

## 2023-02-16 LAB — TROPONIN-I
TROPONIN-I HS: <2.7 ng/L (ref <=14.0)
TROPONIN-I HS: <2.7 ng/L (ref <=14.0)
TROPONIN-I HS: <2.7 ng/L (ref <=14.0)

## 2023-02-16 LAB — PT/INR
INR: 0.89 (ref <=5.00)
PROTHROMBIN TIME: 10.5 seconds (ref 9.7–13.6)

## 2023-02-16 LAB — PTT (PARTIAL THROMBOPLASTIN TIME): APTT: 31.1 seconds (ref 26.0–39.0)

## 2023-02-16 LAB — B-TYPE NATRIURETIC PEPTIDE (BNP),PLASMA: BNP: 11 pg/mL (ref <=99)

## 2023-02-16 LAB — D-DIMER: D-DIMER: 211 ng/mL DDU (ref <233)

## 2023-02-16 LAB — MAGNESIUM: MAGNESIUM: 1.8 mg/dL (ref 1.8–2.6)

## 2023-02-16 MED ORDER — ZENPEP 60000-189600 UNITS PO CPEP: 2.0000 | ORAL_CAPSULE | Freq: Three times a day (TID) | ORAL | 3 refills | Status: DC

## 2023-02-16 MED ORDER — ENOXAPARIN 40 MG/0.4 ML SUBCUTANEOUS SYRINGE
40.0000 mg | INJECTION | SUBCUTANEOUS | Status: DC
Start: 2023-02-17 — End: 2023-02-18
  Administered 2023-02-17 – 2023-02-18 (×2): 40 mg via SUBCUTANEOUS
  Filled 2023-02-16 (×2): qty 0.4

## 2023-02-16 MED ORDER — PRENATAL VITAMIN WITH CALCIUM NO.72-IRON 27 MG-FOLIC ACID 1 MG TABLET
1.0000 | ORAL_TABLET | Freq: Every day | ORAL | Status: DC
Start: 2023-02-17 — End: 2023-02-18
  Administered 2023-02-17 – 2023-02-18 (×2): 0 via ORAL

## 2023-02-16 MED ORDER — IPRATROPIUM 0.5 MG-ALBUTEROL 3 MG (2.5 MG BASE)/3 ML NEBULIZATION SOLN
3.0000 mL | INHALATION_SOLUTION | RESPIRATORY_TRACT | Status: DC | PRN
Start: 2023-02-16 — End: 2023-02-18

## 2023-02-16 MED ORDER — PANTOPRAZOLE 40 MG TABLET,DELAYED RELEASE
40.0000 mg | DELAYED_RELEASE_TABLET | Freq: Every day | ORAL | Status: DC
Start: 2023-02-17 — End: 2023-02-18
  Administered 2023-02-17 – 2023-02-18 (×2): 40 mg via ORAL
  Filled 2023-02-16 (×2): qty 1

## 2023-02-16 MED ORDER — DICLOFENAC 1 % TOPICAL GEL
2.0000 g | Freq: Three times a day (TID) | CUTANEOUS | Status: DC | PRN
Start: 2023-02-16 — End: 2023-02-18

## 2023-02-16 MED ORDER — CLOPIDOGREL 75 MG TABLET
75.0000 mg | ORAL_TABLET | Freq: Every day | ORAL | Status: DC
Start: 2023-02-17 — End: 2023-02-18
  Administered 2023-02-17 – 2023-02-18 (×2): 75 mg via ORAL
  Filled 2023-02-16 (×2): qty 1

## 2023-02-16 MED ORDER — DOCUSATE SODIUM 100 MG CAPSULE
100.0000 mg | ORAL_CAPSULE | Freq: Two times a day (BID) | ORAL | Status: DC
Start: 2023-02-16 — End: 2023-02-18
  Administered 2023-02-17: 100 mg via ORAL
  Administered 2023-02-17: 0 mg via ORAL
  Administered 2023-02-18: 100 mg via ORAL
  Filled 2023-02-16 (×3): qty 1

## 2023-02-16 MED ORDER — FLUTICASONE PROPIONATE 50 MCG/ACTUATION NASAL SPRAY,SUSPENSION
2.0000 | Freq: Every day | NASAL | Status: DC
Start: 2023-02-17 — End: 2023-02-18
  Administered 2023-02-17 – 2023-02-18 (×2): 0 via NASAL

## 2023-02-16 MED ORDER — ACETAMINOPHEN 325 MG TABLET
650.0000 mg | ORAL_TABLET | ORAL | Status: DC | PRN
Start: 2023-02-16 — End: 2023-02-18

## 2023-02-16 MED ORDER — SEMAGLUTIDE 2 MG/DOSE (8 MG/3 ML) SUBCUTANEOUS PEN INJECTOR
2.0000 mg | PEN_INJECTOR | SUBCUTANEOUS | Status: DC
Start: 2023-02-17 — End: 2023-02-18
  Administered 2023-02-17: 0 mg via SUBCUTANEOUS

## 2023-02-16 MED ORDER — NIACIN ER 500 MG TABLET,EXTENDED RELEASE 24 HR
500.0000 mg | ORAL_TABLET | Freq: Every day | ORAL | Status: DC
Start: 2023-02-17 — End: 2023-02-18
  Administered 2023-02-17 (×2): 500 mg via ORAL
  Filled 2023-02-16 (×3): qty 1

## 2023-02-16 MED ORDER — SODIUM CHLORIDE 0.9 % (FLUSH) INJECTION SYRINGE
3.0000 mL | INJECTION | Freq: Three times a day (TID) | INTRAMUSCULAR | Status: DC
Start: 2023-02-16 — End: 2023-02-18
  Administered 2023-02-16 – 2023-02-18 (×6): 0 mL

## 2023-02-16 MED ORDER — ROSUVASTATIN 20 MG TABLET
40.0000 mg | ORAL_TABLET | Freq: Every day | ORAL | Status: DC
Start: 2023-02-17 — End: 2023-02-18
  Administered 2023-02-17 – 2023-02-18 (×2): 40 mg via ORAL
  Filled 2023-02-16 (×2): qty 2

## 2023-02-16 MED ORDER — SENNOSIDES 8.6 MG-DOCUSATE SODIUM 50 MG TABLET
1.0000 | ORAL_TABLET | Freq: Every evening | ORAL | Status: DC
Start: 2023-02-16 — End: 2023-02-18
  Administered 2023-02-17: 0 via ORAL
  Filled 2023-02-16: qty 1

## 2023-02-16 MED ORDER — SODIUM CHLORIDE 0.9 % (FLUSH) INJECTION SYRINGE
3.0000 mL | INJECTION | INTRAMUSCULAR | Status: DC | PRN
Start: 2023-02-16 — End: 2023-02-18

## 2023-02-16 MED ORDER — HYDROCODONE 10 MG-ACETAMINOPHEN 325 MG TABLET
1.0000 | ORAL_TABLET | Freq: Three times a day (TID) | ORAL | Status: DC | PRN
Start: 2023-02-16 — End: 2023-02-18
  Administered 2023-02-17 – 2023-02-18 (×4): 1 via ORAL
  Filled 2023-02-16 (×4): qty 1

## 2023-02-16 MED ORDER — SODIUM CHLORIDE 0.9 % IV BOLUS
1000.0000 mL | INJECTION | Status: AC
Start: 2023-02-16 — End: 2023-02-16
  Administered 2023-02-16: 1000 mL via INTRAVENOUS
  Administered 2023-02-16: 0 mL via INTRAVENOUS

## 2023-02-16 MED ORDER — SODIUM CHLORIDE 0.9 % INTRAVENOUS SOLUTION
INTRAVENOUS | Status: DC
Start: 2023-02-16 — End: 2023-02-18
  Administered 2023-02-18: 0 mL via INTRAVENOUS

## 2023-02-16 MED ORDER — ASPIRIN 81 MG CHEWABLE TABLET
324.0000 mg | CHEWABLE_TABLET | ORAL | Status: AC
Start: 2023-02-16 — End: 2023-02-16
  Administered 2023-02-16: 324 mg via ORAL
  Filled 2023-02-16: qty 4

## 2023-02-16 MED ORDER — DAPAGLIFLOZIN PROPANEDIOL 10 MG TABLET
10.0000 mg | ORAL_TABLET | Freq: Every day | ORAL | Status: DC
Start: 2023-02-17 — End: 2023-02-18
  Administered 2023-02-17 – 2023-02-18 (×2): 10 mg via ORAL
  Filled 2023-02-16 (×2): qty 1

## 2023-02-16 MED ORDER — METOPROLOL SUCCINATE ER 25 MG TABLET,EXTENDED RELEASE 24 HR
12.5000 mg | ORAL_TABLET | Freq: Every day | ORAL | Status: DC
Start: 2023-02-17 — End: 2023-02-18
  Administered 2023-02-17 – 2023-02-18 (×2): 12.5 mg via ORAL
  Filled 2023-02-16 (×2): qty 1

## 2023-02-16 MED ORDER — VENLAFAXINE ER 37.5 MG CAPSULE,EXTENDED RELEASE 24 HR
37.5000 mg | ORAL_CAPSULE | Freq: Every day | ORAL | Status: DC
Start: 2023-02-17 — End: 2023-02-18
  Administered 2023-02-17 – 2023-02-18 (×2): 37.5 mg via ORAL
  Filled 2023-02-16 (×2): qty 1

## 2023-02-16 MED ORDER — ONDANSETRON HCL (PF) 4 MG/2 ML INJECTION SOLUTION
4.0000 mg | Freq: Four times a day (QID) | INTRAMUSCULAR | Status: DC | PRN
Start: 2023-02-16 — End: 2023-02-18

## 2023-02-16 MED ORDER — PIOGLITAZONE 15 MG TABLET
15.0000 mg | ORAL_TABLET | Freq: Every day | ORAL | Status: DC
Start: 2023-02-17 — End: 2023-02-18
  Administered 2023-02-17 (×2): 15 mg via ORAL
  Filled 2023-02-16 (×2): qty 1

## 2023-02-16 MED ORDER — MAGNESIUM HYDROXIDE 400 MG/5 ML ORAL SUSPENSION
15.0000 mL | Freq: Every day | ORAL | Status: DC | PRN
Start: 2023-02-16 — End: 2023-02-18

## 2023-02-16 MED ORDER — LEVOTHYROXINE 25 MCG TABLET
25.0000 ug | ORAL_TABLET | Freq: Every day | ORAL | Status: DC
Start: 2023-02-17 — End: 2023-02-18
  Administered 2023-02-17 – 2023-02-18 (×2): 25 ug via ORAL
  Filled 2023-02-16 (×2): qty 1

## 2023-02-16 MED ORDER — ASPIRIN 81 MG TABLET,DELAYED RELEASE
81.0000 mg | DELAYED_RELEASE_TABLET | Freq: Every day | ORAL | Status: DC
Start: 2023-02-17 — End: 2023-02-18
  Administered 2023-02-17 (×2): 81 mg via ORAL
  Filled 2023-02-16 (×2): qty 1

## 2023-02-16 NOTE — ED Provider Notes (Signed)
Emergency Department Provider Note  HPI - 02/16/2023    COVID-19 PANDEMIC IN EFFECT    History of Present Illness      Monique [name] 57 y.o. female  Date of Birth: 1966-07-26     Attending: Dr. Molli Hazard    PCP: Cherly Beach, MD      Chief Complaint   Patient presents with    Chest Pain     Shortness of Breath       Arrival: The patient arrived by car and is accompanied by husband.   History Provided by: Patient.       HPI:  Monique [name] is a 57 y.o. female with a PMHx significant for MI, who presents to the ED today for chest pain and SOB. Patient was recently discharged from [facility] on 02/12/23, she had a normal stress test and cardiac workup. Patient remarks that she feels weak and her left leg keeps giving out causing her to have several falls. Patient comments that she hasn't had any relief with her chest pressure and wants to know what's wrong. Patient also smokes half a pack of cigarettes a day.   Patient denies nausea, vomiting, diarrhea and chills, and there are no other complaints at this time.  Please see nursing notes for complete PMHx, PSHx, allergies and medication regimen.    Review of Systems     Review of Systems:  Constitutional: No fever. No chills. No malaise/fatigue.  Skin: No rashes. No skin itching.  HENT: No head injury. No sore throat. No ear pain. No difficulty swallowing.  Eyes: No vision changes. No eye discharge.  Cardiovascular:+Chest pain. No palpitations. No leg swelling.  Respiratory:+SOB. No cough.   GI/abdomen: No abdominal pain. No nausea. No vomiting. No diarrhea. No constipation.  GU: No dysuria. No hematuria. No decreased urine output.  MSK/Extremities:+Left leg weakness. No neck pain. No back pain.   Neuro: No headache. No dizziness. No speech change. No LOC. No numbness/tingling.  Psych: No SI. No HI. No substance abuse.  All other systems reviewed and are negative, unless commented on in the HPI.      Past Medical History     Filed Vitals:     02/16/23 2200 02/16/23 2230 02/16/23 2337 02/16/23 2348   BP: 127/76 98/62 100/74 100/74   Pulse: 80 81 78 78   Resp: '17 16 16 16   '$ Temp:   36.5 C (97.7 F) 36.5 C (97.7 F)   SpO2: 96% 94% 98%        PMHx:    Medical History     Diagnosis Date Comment Source    Anxiety       Arthritis       Back problem  DDD     Beta blocker prescribed for left ventricular systolic dysfunction       Blood thinned due to long-term anticoagulant use       Carotid stenosis  occlusion of left ICA     Chest pain 04/03/2018      Chronic back pain       Chronic obstructive pulmonary disease, unspecified COPD type (CMS [place])   12/31/2022      Coronary artery disease involving native coronary artery of native heart         DDD (degenerative disc disease), lumbar  "entire spine"     Ear piercing       GERD (gastroesophageal reflux disease)  controlled with omeprazole     H/O complete eye exam  2 years, Dr. [name], at [facility]     History of dental examination  dentures upper and lower     HTN       Hx of coronary artery bypass graft  2012     Hx of gastric bypass       Hypercholesterolemia       Hyperlipidemia       Hypothyroid       MI (myocardial infarction) (CMS [facility]) 2012      Neck problem  herniated cervical disc     Obesity (BMI 30-39.9)       Peripheral vascular disease, unspecified (CMS [place]) 01/03/2021      Solitary nodule of right lobe of thyroid 03/30/2018 Incidental finding on   MRI thoracic spine. [place] ordered.      Stented coronary artery  x4     Stroke (CMS [facility])  2002     Tattoo  Left breast, Right shoulder     Thyroid disorder       Thyroid nodule       Type 2 diabetes mellitus (CMS [facility])  HGA1C 10.4 on 12/19/21     Uncontrolled type 2 diabetes mellitus, without long-term current use of   insulin       Xanthelasma            Allergies:    Allergies   Allergen Reactions    Capron Dm [Pyrilamine-Dextromethorphan]  Other Adverse Reaction (Add comment)     LIGHT HEADED    Other      Coban dressing caused skin break down    Tylenol W Codeine  [Acetaminophen-Codeine]  Other Adverse Reaction (Add comment)     pt states that one time she had chest pains with this med, but dr. thought it was because she had not eaten       Social History  Social History     Tobacco Use    Smoking status: Every Day     Current packs/day: 0.50     Average packs/day: 0.5 packs/day for 43.2 years (21.6 ttl pk-yrs)     Types: Cigarettes     Start date: 104    Smokeless tobacco: Never   Vaping Use    Vaping status: Never Used   Substance Use Topics    Alcohol use: No    Drug use: No       Family History  Family Medical History:       Problem Relation (Age of Onset)    Atrial fibrillation Sister    Colon Cancer Father    Diabetes Mother, Maternal Aunt    Heart Disease Sister, Maternal Grandfather    Hypertension (High Blood Pressure) Mother    Lung Cancer Maternal Grandfather    MI <88 years of age Mother           Home Meds:   Medications Prior to Admission       Prescriptions    aspirin (ECOTRIN) 81 mg Oral Tablet, Delayed Release (E.C.)    Take 1 Tablet (81 mg total) by mouth Once a day    azithromycin (ZITHROMAX) 250 mg Oral Tablet    Take 500 mg (2 tab) on day 1; take 250 mg (1 tab) on days 2-5.    Blood Sugar Diagnostic (ACCU-CHEK GUIDE TEST STRIPS) Does not apply Strip    TEST BLOOD SUGAR THREE TIMES DAILY    Blood-Glucose Meter (ACCU-CHEK AVIVA PLUS METER) Misc    1 Kit Three times a day  clopidogreL (PLAVIX) 75 mg Oral Tablet    TAKE 1 TABLET EVERY DAY    cyanocobalamin (VITAMIN B12) 1,000 mcg/mL Injection Solution    INJECT 1 ML (1,000 MCG TOTAL) UNDER THE SKIN EVERY 30 DAYS    diclofenac sodium (VOLTAREN) 1 % Gel    Apply 2 g topically Three times a day as needed    docusate sodium (COLACE) 100 mg Oral Capsule    TAKE 1 CAPSULE TWICE DAILY    empagliflozin (JARDIANCE) 10 mg Oral Tablet    Take 1 Tablet (10 mg total) by mouth Once a day    ergocalciferol, vitamin D2, (DRISDOL) 1,250 mcg (50,000 unit) Oral Capsule    TAKE 1 CAPSULE BY MOUTH EVERY WEEK    fluticasone  propionate (FLONASE) 50 mcg/actuation Nasal Spray, Suspension    Administer 2 Sprays into each nostril Once a day    furosemide (LASIX) 40 mg Oral Tablet    TAKE 1 TABLET EVERY DAY    HYDROcodone-acetaminophen (NORCO) 10-325 mg Oral Tablet    Take 1 Tablet by mouth Every 8 hours as needed for Pain [name] with [facility]    Lancets (ACCU-CHEK SOFTCLIX LANCETS) Misc    To use daily    levothyroxine (SYNTHROID) 25 mcg Oral Tablet    TAKE 1 TABLET ONE TIME DAILY    metoprolol succinate (TOPROL-XL) 25 mg Oral Tablet Sustained Release 24 hr    Take 0.5 Tablets (12.5 mg total) by mouth Once a day    NARCAN 4 mg/actuation Nasal Spray, Non-Aerosol    PT STATED SHE HAS NARCAN AT HOME BUT IS NOT TAKING IT    Needle, Disp, 25 G 25 gauge x 1" Needle    1 mL Every 30 days    niacin (NIASPAN) 500 mg Oral Tablet Sustained Release 24 hr    TAKE 1 TABLET EVERY DAY    nitroGLYCERIN (NITROSTAT) 0.4 mg Sublingual Tablet, Sublingual    Place 1 Tablet (0.4 mg total) under the tongue Every 5 minutes as needed for Chest pain for 3 doses over 15 minutes    omeprazole (PRILOSEC) 40 mg Oral Capsule, Delayed Release(E.C.)    TAKE 1 CAPSULE EVERY DAY    ondansetron (ZOFRAN ODT) 8 mg Oral Tablet, Rapid Dissolve    DISSOLVE 1 TABLET ON THE TONGUE EVERY 8 HOURS AS NEEDED FOR NAUSEA OR VOMITING    OZEMPIC 2 mg/dose (8 mg/3 mL) Subcutaneous Pen Injector    Inject 2 mg under the skin Every 7 days    pioglitazone (ACTOS) 15 mg Oral Tablet    Take 1 Tablet (15 mg total) by mouth Once a day    PRENATAL PLUS, CALCIUM CARB, 27 mg iron- 1 mg Oral Tablet    Take 1 Tablet by mouth Once a day    rosuvastatin (CRESTOR) 40 mg Oral Tablet    Take 1 Tablet (40 mg total) by mouth Once a day TAKE 1 TABLET BY MOUTH ONCE DAILY    sennosides-docusate sodium (SENOKOT-S) 8.6-50 mg Oral Tablet    Take 1 Tablet by mouth Every evening    venlafaxine (EFFEXOR XR) 37.5 mg Oral Capsule, Sust. Release 24 hr    TAKE 1 CAPSULE ONE TIME DAILY    ZYRTEC-D  5-120 mg Oral Tablet Sustained Release 12 hr    Take 1 Tablet by mouth Once a day                  Exam and Objective Findings  Physical Exam  Nursing note and vitals reviewed.  Vital signs reviewed as above.     Constitutional: Pt is awake, alert, and in no acute distress.   Skin: Warm and dry. No rash or lesions.  HENT: Atraumatic. Moist mucous membranes. No pharyngeal erythema.  Eyes: Conjunctivae are normal. Pupils are equal, round, and reactive to light.  Cardiovascular: Regular rate. Normal rhythm. Distal pulses intact bilaterally. No leg swelling.  Respiratory:+Wheezing to the left lung field.  Effort normal. No respiratory distress.   GI/abdomen: Abdomen is soft, nontender, nondistended. No rebound, guarding, or masses.   MSK/Extremities: Normal range of motion. No tenderness or redness.   Neurological: Patient is alert and oriented to person, place and time.   Psychiatric: Patient has a normal mood and affect.     Orders     Work-up:  Orders Placed This Encounter    XR AP MOBILE CHEST    TROPONIN-I - TO BE DRAWN NOW    TROPONIN-I - TO BE DRAWN IN ONE HOUR AFTER INITIAL DRAW    TROPONIN-I - TO BE DRAWN IN 3 HOURS AFTER INITIAL DRAW    CBC/DIFF    COMPREHENSIVE METABOLIC PANEL, NON-FASTING    PT/INR    PTT (PARTIAL THROMBOPLASTIN TIME)    MAGNESIUM    D-DIMER    B-TYPE NATRIURETIC PEPTIDE    CBC WITH DIFF    OXYGEN PROTOCOL    OXYGEN PROTOCOL    ECG 12 LEAD ONE TIME    ECG 12 LEAD ONE TIME    ECG 12 LEAD ONE TIME    INSERT & MAINTAIN PERIPHERAL IV ACCESS    PERIPHERAL IV DRESSING CHANGE    INSERT & MAINTAIN PERIPHERAL IV ACCESS    PERIPHERAL IV DRESSING CHANGE    PATIENT CLASS/LEVEL OF CARE DESIGNATION - CCMC    NS flush syringe    NS flush syringe    aspirin chewable tablet 324 mg    NS bolus infusion 1,000 mL    HYDROcodone-acetaminophen (NORCO) 10-325 mg per tablet    diclofenac (VOLTAREN) 1% topical gel    aspirin (ECOTRIN) enteric coated tablet 81 mg    sennosides-docusate sodium (SENOKOT-S) 8.6-'50mg'$   per tablet    prenatal (TRINATAL) vitamin    docusate sodium (COLACE) capsule    rosuvastatin (CRESTOR) tablet    fluticasone (FLONASE) 50 mcg per spray nasal spray    metoprolol succinate (TOPROL-XL) 24 hr extended release tablet    semaglutide Pen Injector 2 mg    venlafaxine (EFFEXOR XR) 24 hr extended release capsule    clopidogrel (PLAVIX) 75 mg tablet    levothyroxine (SYNTHROID) tablet    niacin (NIASPAN) extended release tablet    pioglitazone (ACTOS) tablet    dapagliflozin (FARXIGA) tablet    pantoprazole (PROTONIX) delayed release tablet    magnesium hydroxide (MILK OF MAGNESIA) '400mg'$  per 60m oral liquid    ondansetron (ZOFRAN) 2 mg/mL injection    NS flush syringe    NS flush syringe    enoxaparin PF (LOVENOX) 40 mg/0.4 mL SubQ injection    ipratropium-albuterol 0.5 mg-3 mg(2.5 mg base)/3 mL Solution for Nebulization    acetaminophen (TYLENOL) tablet    NS premix infusion        Labs     Labs:  Results for orders placed or performed during the hospital encounter of 02/16/23 (from the past 24 hour(s))   TROPONIN-I - TO BE DRAWN NOW   Result Value Ref Range    TROPONIN-I HS <2.7 <=14.0 ng/L ng/L  TROPONIN-I - TO BE DRAWN IN ONE HOUR AFTER INITIAL DRAW   Result Value Ref Range    TROPONIN-I HS <2.7 <=14.0 ng/L ng/L   TROPONIN-I - TO BE DRAWN IN 3 HOURS AFTER INITIAL DRAW   Result Value Ref Range    TROPONIN-I HS <2.7 <=14.0 ng/L ng/L   CBC/DIFF    Narrative    The following orders were created for panel order CBC/DIFF.  Procedure                               Abnormality         Status                     ---------                               -----------         ------                     CBC WITH [id number]                                    Final result                 Please view results for these tests on the individual orders.   COMPREHENSIVE METABOLIC PANEL, NON-FASTING   Result Value Ref Range    SODIUM 136 136 - 145 mmol/L    POTASSIUM 3.7 3.5 - 5.1 mmol/L    CHLORIDE 104 96 - 111 mmol/L     CO2 TOTAL 15 (L) 22 - 30 mmol/L    ANION GAP 17 (H) 4 - 13 mmol/L    BUN 14 8 - 25 mg/dL    CREATININE 1.25 (H) 0.60 - 1.05 mg/dL    BUN/CREA RATIO 11 6 - 22    ALBUMIN 3.7 3.5 - 5.0 g/dL     CALCIUM 9.2 8.6 - 10.2 mg/dL    GLUCOSE 170 (H) 65 - 125 mg/dL    ALKALINE PHOSPHATASE 112 50 - 130 U/L    ALT (SGPT) 30 (H) 8 - 22 U/L    AST (SGOT)  35 8 - 45 U/L    BILIRUBIN TOTAL 0.3 0.3 - 1.3 mg/dL    PROTEIN TOTAL 7.2 6.4 - 8.3 g/dL    ESTIMATED GFR - FEMALE 50 (L) >=60 mL/min/BSA   PT/INR   Result Value Ref Range    PROTHROMBIN TIME 10.5 9.7 - 13.6 seconds    INR 0.89 <=5.00   PTT (PARTIAL THROMBOPLASTIN TIME)   Result Value Ref Range    APTT 31.1 26.0 - 39.0 seconds    Narrative    Therapeutic range for unfractionated heparin is 60-100 seconds.   MAGNESIUM   Result Value Ref Range    MAGNESIUM 1.8 1.8 - 2.6 mg/dL   D-DIMER   Result Value Ref Range    D-DIMER 211 <233 ng/mL DDU   B-TYPE NATRIURETIC PEPTIDE   Result Value Ref Range    BNP 11 <=99 pg/mL   CBC WITH DIFF   Result Value Ref Range    WBC 9.2 3.7 - 11.0 x10^3/uL    RBC 5.08 3.85 - 5.22 x10^6/uL    HGB 15.3 11.5 - 16.0 g/dL    HCT  44.1 34.8 - 46.0 %    MCV 86.8 78.0 - 100.0 fL    MCH 30.1 26.0 - 32.0 pg    MCHC 34.7 31.0 - 35.5 g/dL    RDW-CV 13.2 11.5 - 15.5 %    PLATELETS 347 150 - 400 x10^3/uL    MPV 9.7 8.7 - 12.5 fL    NEUTROPHIL % 62.0 %    LYMPHOCYTE % 29.0 %    MONOCYTE % 8.0 %    EOSINOPHIL % 1.0 %    BASOPHIL % 0.0 %    NEUTROPHIL # 5.63 1.50 - 7.70 x10^3/uL    LYMPHOCYTE # 2.66 1.00 - 4.80 x10^3/uL    MONOCYTE # 0.75 0.20 - 1.10 x10^3/uL    EOSINOPHIL # <0.10 <=0.50 x10^3/uL    BASOPHIL # <0.10 <=0.20 x10^3/uL    IMMATURE GRANULOCYTE % 0.0 0.0 - 1.0 %    IMMATURE GRANULOCYTE # <0.10 <0.10 x10^3/uL       Abnormal Lab results:  Labs Ordered/Reviewed   COMPREHENSIVE METABOLIC PANEL, NON-FASTING - Abnormal; Notable for the following components:       Result Value    CO2 TOTAL 15 (*)     ANION GAP 17 (*)     CREATININE 1.25 (*)     GLUCOSE 170 (*)     ALT  (SGPT) 30 (*)     ESTIMATED GFR - FEMALE 50 (*)     All other components within normal limits   TROPONIN-I - Normal   TROPONIN-I - Normal   PT/INR - Normal   PTT (PARTIAL THROMBOPLASTIN TIME) - Normal    Narrative:     Therapeutic range for unfractionated heparin is 60-100 seconds.   MAGNESIUM - Normal   D-DIMER - Normal   B-TYPE NATRIURETIC PEPTIDE (BNP),PLASMA - Normal   CBC/DIFF    Narrative:     The following orders were created for panel order CBC/DIFF.  Procedure                               Abnormality         Status                     ---------                               -----------         ------                     CBC WITH [id number]                                    Final result                 Please view results for these tests on the individual orders.   CBC WITH DIFF       Imaging     Imaging:   Results for orders placed or performed during the hospital encounter of 02/16/23 (from the past 72 hour(s))   XR AP MOBILE CHEST     Status: None    Narrative    History: Chest pain and shortness of breath.    Single AP portable view of the chest is obtained. The heart size is normal following prior median sternotomy.  There is calcification of the thoracic aorta.. The lungs are clear with no acute pulmonary infiltrates or effusions. If the patient's clinical symptoms persist, a followup PA and lateral chest series is recommended.      Impression    1. No acute pulmonary process. No change from 12/24/2021.        Radiologist location ID: [id number]         EKG's     ECG:     Date/Time ECG Read: 02/17/2023 2021   Most Recent EKG This Encounter   ECG 12 LEAD ONE TIME    Collection Time: 02/16/23 10:39 PM   Result Value    Ventricular rate 71    Atrial Rate 71    PR Interval 164    QRS Duration 92    QT Interval 418    QTC Calculation 454    Calculated P Axis 48    Calculated R Axis -24    Calculated T Axis 47    Narrative    Normal sinus rhythm  Nonspecific T wave abnormality  Abnormal ECG        Assessment & Plan     Plan: Appropriate testing. Medical Records reviewed.    MDM:   During the patient's stay in the emergency department, the above listed information was utilized to assist with medical decision making.   The above listed testing was performed to assist with medical decision making, and was reviewed when available for review.   Pt remained stable throughout the emergency department course.    Medical Decision Making  Amount and/or Complexity of Data Reviewed  Labs: ordered.  Radiology: ordered.    Risk  OTC drugs.  Prescription drug management.  Decision regarding hospitalization.        ED Course as of 02/17/23 0403   Mon Feb 15, 8174   783 57 year old female patient presented to the emergency department complaint of weakness.  Patient was recently in the hospital at [facility] had a stress test that was negative she has a history of cardiac disease states that she was instructed to follow-up with cardiology she has an appointment in 2 days but states she has been feeling extremely weak and did not feel like she would be able to make it to the appointment.  She states that she feels like "something is going on".  She denies any fever, chills, chest pain, patient does complain of increased shortness of breath with exertion.  Differential diagnosis consists of electrolyte abnormalities, dehydration, acute coronary syndrome, congestive heart failure, pneumonia, among other diagnosis not listed.  Her BNP is normal at 11 which makes patient low probability for congestive heart failure D-dimer is also negative at 211 which makes her low probability for PE.  Electrolytes did demonstrated elevated anion gap at 17 and carbon dioxide is low at 15 with a creatinine being elevated 1.25 which would be consistent with dehydration.  Patient is currently receiving IV fluids.  Patient has 2- cardiac troponins.   2140 Patient was started on IV fluids she will be admitted to the hospital for  dehydration weakness chest x-ray is unremarkable, patient's D-dimer is negative.  I discussed this with the patient she will be admitted to the hospital for further evaluation for her acute kidney injury, dehydration, and weakness.   2141 Consult medicine service pending   2149 Spoke with Dr. Tasia Catchings discussed the patient's condition he is in agreement to admit her the hospital.       Consults  Consults:   Spoke to Dr. Tasia Catchings and he admitted patient to Dr. Russ Halo.    Disposition     Impression:   Diagnoses       Diagnosis Comment Added By Time Added    Weakness  Edmon Crape, DO 02/16/2023  9:41 PM    Dehydration  Edmon Crape, DO 02/16/2023  9:41 PM    AKI (acute kidney injury) (CMS [place])  Edmon Crape, DO 02/16/2023  9:42 PM          Disposition:  Admitted    Patient will be admitted to Dr. Russ Halo for further evaluation and management.    Attestations     I am scribing for, and in the presence of, Dr. Molli Hazard, for services provided on 02/16/2023.  Ralene Bathe, [place], [place] 02/16/2023, 19:57       I personally performed the services described in this documentation, as scribed  in my presence, and it is both accurate  and complete.    Edmon Crape, DO     This note may have been partially generated using MModal Fluency Direct system, and there may be some incorrect words, spellings, and punctuation that were not noted in checking the chart before saving.

## 2023-02-16 NOTE — ED Nurses Note (Signed)
Pt rounding.  On cardiac monitor.  No signs of acute distress at this time.  Pt asked for and given water and food at this time, provider aware.

## 2023-02-16 NOTE — ED Triage Notes (Signed)
Pt reports chest pain and shortness of breath. States she was recently in [place] hospital in patient and had normal stress test and cardiac workup. States she feels weak and her legs give out causing her to fall a couple of days ago. Denies n/v/d. Overall not feeling well.

## 2023-02-17 ENCOUNTER — Inpatient Hospital Stay (HOSPITAL_COMMUNITY): Payer: Commercial Managed Care - PPO

## 2023-02-17 ENCOUNTER — Telehealth: Payer: Self-pay

## 2023-02-17 DIAGNOSIS — E860 Dehydration: Secondary | ICD-10-CM

## 2023-02-17 DIAGNOSIS — E1151 Type 2 diabetes mellitus with diabetic peripheral angiopathy without gangrene: Secondary | ICD-10-CM

## 2023-02-17 DIAGNOSIS — I2510 Atherosclerotic heart disease of native coronary artery without angina pectoris: Secondary | ICD-10-CM

## 2023-02-17 DIAGNOSIS — R079 Chest pain, unspecified: Secondary | ICD-10-CM

## 2023-02-17 DIAGNOSIS — Z7902 Long term (current) use of antithrombotics/antiplatelets: Secondary | ICD-10-CM

## 2023-02-17 DIAGNOSIS — Z951 Presence of aortocoronary bypass graft: Secondary | ICD-10-CM

## 2023-02-17 DIAGNOSIS — E118 Type 2 diabetes mellitus with unspecified complications: Secondary | ICD-10-CM

## 2023-02-17 DIAGNOSIS — R9431 Abnormal electrocardiogram [ECG] [EKG]: Secondary | ICD-10-CM

## 2023-02-17 LAB — ECG 12 LEAD
Atrial Rate: 93 {beats}/min
Atrial Rate: 84 {beats}/min
Atrial Rate: 71 {beats}/min
Calculated P Axis: 60 degrees
Calculated P Axis: 48 degrees
Calculated P Axis: 48 degrees
Calculated R Axis: -29 degrees
Calculated R Axis: -31 degrees
Calculated R Axis: -24 degrees
Calculated T Axis: 112 degrees
Calculated T Axis: 81 degrees
Calculated T Axis: 47 degrees
PR Interval: 162 ms
PR Interval: 164 ms
PR Interval: 164 ms
QRS Duration: 84 ms
QRS Duration: 92 ms
QRS Duration: 92 ms
QT Interval: 360 ms
QT Interval: 384 ms
QT Interval: 418 ms
QTC Calculation: 447 ms
QTC Calculation: 453 ms
QTC Calculation: 454 ms
Ventricular rate: 93 {beats}/min
Ventricular rate: 84 {beats}/min
Ventricular rate: 71 {beats}/min

## 2023-02-17 LAB — POC BLOOD GLUCOSE (RESULTS)
GLUCOSE, POC: 91 mg/dl (ref 80–130)
GLUCOSE, POC: 157 mg/dl (ref 80–130)
GLUCOSE, POC: 107 mg/dl (ref 80–130)
GLUCOSE, POC: 151 mg/dl (ref 80–130)

## 2023-02-17 LAB — LIPASE: LIPASE: 31 U/L (ref 10–60)

## 2023-02-17 MED ORDER — DEXTROSE 50 % IN WATER (D50W) INTRAVENOUS SYRINGE
12.5000 g | INJECTION | INTRAVENOUS | Status: DC | PRN
Start: 2023-02-17 — End: 2023-02-18

## 2023-02-17 MED ORDER — GLUCAGON HCL 1 MG/ML SOLUTION FOR INJECTION
1.0000 mg | Freq: Once | INTRAMUSCULAR | Status: DC | PRN
Start: 2023-02-17 — End: 2023-02-18

## 2023-02-17 MED ORDER — INSULIN LISPRO 100 UNIT/ML SUB-Q SSIP PEN
0.0000 [IU] | INJECTION | Freq: Four times a day (QID) | SUBCUTANEOUS | Status: DC
Start: 2023-02-17 — End: 2023-02-18
  Administered 2023-02-17 – 2023-02-18 (×5): 0 [IU] via SUBCUTANEOUS
  Filled 2023-02-17: qty 300

## 2023-02-17 MED ORDER — DEXTROSE 40 % ORAL GEL
15.0000 g | ORAL | Status: DC | PRN
Start: 2023-02-17 — End: 2023-02-18

## 2023-02-17 NOTE — Telephone Encounter (Signed)
noted 

## 2023-02-17 NOTE — Telephone Encounter (Signed)
Noted, also spoke with Vicente Males.   Phoned and LMOVM of the pt to return call

## 2023-02-17 NOTE — Telephone Encounter (Signed)
See pt advise note

## 2023-02-17 NOTE — Telephone Encounter (Signed)
Documentation from [employer] [facility]  notice of denial for the pt's Zenpep. Dx or medication was not covered by insurance. Will look at the pt's formulary to see what is covered.

## 2023-02-17 NOTE — Telephone Encounter (Signed)
Vicente Males had spoke with me in between pt's and seen where the dosage of Zenpep was denied due to the dosage. Creon and Pancreaze are the two that doesn't need a PA. Please advise. The Pancreaze oral cap cannot be higher than 14200 unit. Vicente Males had stated that she can stay on the dosage of Zenpep she is already on. Please advise

## 2023-02-17 NOTE — Telephone Encounter (Signed)
PA done for Zenpep [id number] UNIT DR CAP on Cover My Meds. Dx used: K85.90 (acute pancreatitis) and R14.0 (bloating). Pt has tried and failed Protonix, and Xixafan isn't enough @ times. Waiting on a response from the insurance.

## 2023-02-17 NOTE — Nurses Notes (Signed)
Patient sitting up in bed. No s/s of distress noted. Resp even and unlabored. No needs or concerns voiced at this time. Will continue to monitor. Sedonia Small, RN

## 2023-02-17 NOTE — Consults (Signed)
Monique [name], Monique [name]  Date of Admission:  02/16/2023  Date of Birth:  [birth year]      Reason for Consultation:  Chest pain      Impression:  57 year old female with poorly controlled diabetes mellitus, longstanding and continued tobacco use and known coronary artery disease.  She was recently hospitalized at another facility with complaints of persistent chest pain and cardiac testing reportedly unremarkable.  She reported continued chest pain with negative cardiac biomarkers at this facility and no dynamic changes.  She was noted to have metabolic acidosis and with initiation of IV fluids her chest pain/pressure has resolved.  The patient states that prior to her admission at the outside facility that she had had profound weakness and had sustained 2 falls as result of the weakness.  She describes orthostatic changes.  She was hospitalized for several days outside facility and reports that she had testing but was never given any fluids.  Upon discharge her symptoms persisted and she returned to the hospital for evaluation at this facility    Recommendations:   Obtain records of the stress test at the outside facility  Continue IV fluids overnight  Have strongly encouraged lifestyle changes with smoking cessation and optimization of glycemic control multiple times in the past  If chest pain recurs despite correction of her metabolic derangement will discuss other options further cardiac testing but presently the patient is pain-free and is hopeful to go home.      History:  Coronary artery disease with bypass surgery in 2012 with LIMA to LAD and SVG to OM.  Approximately 4 months after surgery the patient sustained acute myocardial infarction and required percutaneous intervention by Dr. Laurin Coder.   The patient had a 2.75 X 12 Promus stent placed in the proximal IMA graft and then a 2.75 X 8 Promus stent in the distal portion of the graft.  A 2.75 X 8 Promus stent was  placed at the ostium of the LAD with plaque shifting to the circumflex.  A 3.5 X 8 Promus stent was then deployed in the left main extending into the left circumflex with final kissing balloon angioplasty of the LAD and circumflex.   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  The right ICA has less than 50% stenosis.  Unfortunately despite her significant cardiovascular history the patient continues to smoke.     Patient Active Problem List    Diagnosis Date Noted    Dehydration 02/17/2023    Weakness 02/16/2023    Atherosclerosis of native artery of right lower extremity with rest pain (CMS [facility]) 12/31/2022    Chronic obstructive pulmonary disease, unspecified COPD type (CMS [place]) 12/31/2022    Unspecified inflammatory spondylopathy, lumbar region (CMS [place]) 07/17/2022    Other specified disorders of adrenal gland (CMS [place]) 07/17/2022    Left leg weakness 05/15/2021    Dizziness 05/15/2021    Chronic lumbar radiculopathy 05/15/2021    Hypertension associated with type 2 diabetes mellitus (CMS [place])  05/15/2021    Type 2 diabetes mellitus with complication, without long-term current use of insulin (CMS [place]) 05/15/2021    Major depressive disorder, recurrent, unspecified (CMS [place]) 01/03/2021    Peripheral vascular disease, unspecified (CMS [place]) 01/03/2021    Fracture of phalanx of finger of right hand 12/05/2019    Spinal pain 12/05/2019    Type  2 diabetes mellitus with hyperglycemia (CMS [place]) 12/05/2019    Upper respiratory infection 12/05/2019    Thyroid disorder 09/16/2019    UDS: 07/28/19 08/02/2019    Sinus congestion 12/07/2018    Morbid obesity (CMS [place]) 09/13/2018    Type 2 diabetes mellitus with hyperosmolarity without coma, without long-term current use of insulin (CMS [facility])     Coronary artery disease involving native coronary artery of native heart     GERD (gastroesophageal reflux disease)     HTN  (hypertension)     Hyperlipidemia     Constipation 04/15/2018    Thyroid nodule 04/15/2018    Chest pain 04/03/2018    DDD (degenerative disc disease), lumbar     Anxiety     Chronic back pain     Carotid stenosis     BMI 32.0-32.9,adult     H/O complete eye exam     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    Xanthelasma 03/19/2011    Hypercholesterolemia 04/15/2002       Past Medical History:   Diagnosis Date    Anxiety     Arthritis     Back problem     DDD    Beta blocker prescribed for left ventricular systolic dysfunction     Blood thinned due to long-term anticoagulant use     Carotid stenosis     occlusion of left ICA    Chest pain 04/03/2018    Chronic back pain     Chronic obstructive pulmonary disease, unspecified COPD type (CMS [place]) 12/31/2022    Coronary artery disease involving native coronary artery of native heart     DDD (degenerative disc disease), lumbar     "entire spine"    Ear piercing     GERD (gastroesophageal reflux disease)     controlled with omeprazole    H/O complete eye exam     2 years, Dr. [name], at [facility]    History of dental examination     dentures upper and lower    HTN     Hx of coronary artery bypass graft     2012    Hx of gastric bypass     Hypercholesterolemia     Hyperlipidemia     Hypothyroid     MI (myocardial infarction) (CMS [place]) 2012    Neck problem     herniated cervical disc    Obesity (BMI 30-39.9)     Peripheral vascular disease, unspecified (CMS [place]) 01/03/2021    Solitary nodule of right lobe of thyroid 03/30/2018    Incidental finding on MRI thoracic spine. [place] ordered.     Stented coronary artery     x4    Stroke (CMS [facility])     2002    Tattoo     Left breast, Right shoulder    Thyroid disorder     Thyroid nodule     Type 2 diabetes mellitus (CMS [facility])     HGA1C 10.4 on 12/19/21    Uncontrolled type 2 diabetes mellitus, without long-term current use of insulin     Xanthelasma            Past Surgical History:   Procedure Laterality Date    ENDOMETRIAL ABLATION  1998    HX ANKLE FRACTURE  [place]  1990s    Right, Doran Durand procedure    HX CATARACT REMOVAL Bilateral 2013    r and l eyes with implants    HX CESAREAN SECTION  06/20/2000  x3, 11/21/86, 11/10/84    HX CHOLECYSTECTOMY  1988    HX CORONARY ARTERY BYPASS GRAFT  03/31/2011    cardiac, 2 vessel, Dr. [name], [facility]    HX CORONARY STENT PLACEMENT  2012    x4 stents    HX GASTRIC SLEEVE  02/2017    [place], [place], Dr. [name] HEART CATHETERIZATION  2012    massive heart attack and stents - [facility]    HX THYROID BIOPSY      HX THYROIDECTOMY  101/01/2018    HX TONSILLECTOMY      as child    HX YAG Left 09/20/2013           acetaminophen (TYLENOL) tablet, 650 mg, Oral, Q4H PRN  aspirin (ECOTRIN) enteric coated tablet 81 mg, 81 mg, Oral, Daily  clopidogrel (PLAVIX) 75 mg tablet, 75 mg, Oral, Daily  Correction/SSIP insulin lispro (HumaLOG) 100 units/mL injection pen, 0-18 Units, Subcutaneous, 4x/day AC  dapagliflozin (FARXIGA) tablet, 10 mg, Oral, Daily  dextrose (GLUTOSE) 40% oral gel, 15 g, Oral, Q15 Min PRN  dextrose 50% (0.5 g/mL) injection - syringe, 12.5 g, Intravenous, Q15 Min PRN  diclofenac (VOLTAREN) 1% topical gel, 2 g, Apply Topically, 3x/day PRN  docusate sodium (COLACE) capsule, 100 mg, Oral, 2x/day  enoxaparin PF (LOVENOX) 40 mg/0.4 mL SubQ injection, 40 mg, Subcutaneous, Q24H  fluticasone (FLONASE) 50 mcg per spray nasal spray, 2 Spray, Each Nostril, Daily  glucagon injection 1 mg, 1 mg, IntraMUSCULAR, Once PRN  HYDROcodone-acetaminophen (NORCO) 10-325 mg per tablet, 1 Tablet, Oral, Q8H PRN  ipratropium-albuterol 0.5 mg-3 mg(2.5 mg base)/3 mL Solution for Nebulization, 3 mL, Nebulization, Q4H PRN  levothyroxine (SYNTHROID) tablet, 25 mcg, Oral, Daily  magnesium hydroxide (MILK OF MAGNESIA) '400mg'$  per 60m oral liquid, 15 mL, Oral, Daily PRN  metoprolol succinate (TOPROL-XL) 24 hr extended release tablet, 12.5 mg, Oral, Daily  niacin (NIASPAN) extended release tablet, 500 mg, Oral, Daily  NS flush syringe, 3 mL, Intracatheter,  Q8HRS  NS flush syringe, 3 mL, Intracatheter, Q1H PRN  NS flush syringe, 3 mL, Intracatheter, Q8HRS  NS flush syringe, 3 mL, Intracatheter, Q1H PRN  NS premix infusion, , Intravenous, Continuous  ondansetron (ZOFRAN) 2 mg/mL injection, 4 mg, Intravenous, Q6H PRN  pantoprazole (PROTONIX) delayed release tablet, 40 mg, Oral, Daily  pioglitazone (ACTOS) tablet, 15 mg, Oral, Daily  prenatal (TRINATAL) vitamin, 1 Tablet, Oral, Daily  rosuvastatin (CRESTOR) tablet, 40 mg, Oral, Daily  semaglutide Pen Injector 2 mg, 2 mg, Subcutaneous, Q7 Days  sennosides-docusate sodium (SENOKOT-S) 8.6-'50mg'$  per tablet, 1 Tablet, Oral, QPM  venlafaxine (EFFEXOR XR) 24 hr extended release capsule, 37.5 mg, Oral, Daily      '@OPMEDS'$ @    Allergies   Allergen Reactions    Capron Dm [Pyrilamine-Dextromethorphan]  Other Adverse Reaction (Add comment)     LIGHT HEADED    Other      Coban dressing caused skin break down    Tylenol W Codeine [Acetaminophen-Codeine]  Other Adverse Reaction (Add comment)     pt states that one time she had chest pains with this med, but dr. thought it was because she had not eaten        Family History:     Family Medical History:       Problem Relation (Age of Onset)    Atrial fibrillation Sister    Colon Cancer Father    Diabetes Mother, Maternal Aunt    Heart Disease Sister, Maternal Grandfather    Hypertension (High Blood  Pressure) Mother    Lung Cancer Maternal Grandfather    MI <67 years of age Mother             Social History     Tobacco Use    Smoking status: Every Day     Current packs/day: 0.50     Average packs/day: 0.5 packs/day for 43.2 years (21.6 ttl pk-yrs)     Types: Cigarettes     Start date: 83    Smokeless tobacco: Never   Substance Use Topics    Alcohol use: No         ROS: Other than ROS in the HPI, all other systems were negative.    Exam:  BP 113/66   Pulse 62   Temp 36.5 C (97.7 F)   Resp 18   Ht 1.702 m ('5\' 7"'$ )   Wt 91.2 kg (201 lb)   LMP  (LMP Unknown)   SpO2 95%   BMI 31.48  kg/m       GEN:  Pleasant, appears older than her stated age.   Neck: No JVD, thyromegaly or bruit  Lungs: Clear bilaterally  CV:  Regular rhythm.  Normal S1 and S2.  ABD: Soft, non-tender   EXT: No cyanosis or edema.  Mild abrasion on the left knee  NEURO: No focal defects`    Labs:  BMP:     Recent Labs     02/16/23  2006   SODIUM 136   POTASSIUM 3.7   CHLORIDE 104   CO2 15*   BUN 14   CREATININE 1.25*   ANIONGAP 17*   BUNCRRATIO 11   GFR 50*   CALCIUM 9.2     CBC Results Differential Results   Recent Labs     02/16/23  2006   WBC 9.2   HGB 15.3   HCT 44.1   PLTCNT 347    Recent Results (from the past 30 hour(s))   CBC WITH DIFF    Collection Time: 02/16/23  8:06 PM   Result Value    WBC 9.2    NEUTROPHIL % 62.0    MONOCYTE % 8.0    BASOPHIL % 0.0    BASOPHIL # <0.10        Hepatic Function:    Recent Labs     02/16/23  2006 02/16/23  2232   TOTALPROTEIN 7.2  --    ALBUMIN 3.7  --    TOTBILIRUBIN 0.3  --    AST 35  --    ALT 30*  --    ALKPHOS 112  --    LIPASE  --  31     Albumin:    Recent Labs     02/16/23  2006   ALBUMIN 3.7     Magnesium:     Recent Labs     02/16/23  2006   MAGNESIUM 1.8     LDH:  No results found for this encounter  ESR(automated)/C-protein: No results found for this encounter  Coags:    Recent Labs     02/16/23  2006   PROTHROMTME 10.5   INR 0.89   APTT 31.1   DDIMERQUANT 211     Most Recent Cardiac Markers:    Recent Labs     02/16/23  2006   BNP 11       Routine U/A:     Recent Labs     02/16/23  2006   GLUCOSE 170*  Imaging Studies:    Most Recent EKG Results in Last 30 Days   ECG 12 LEAD ONE TIME    Collection Time: 02/16/23 10:39 PM   Result Value Ref Range    Ventricular rate 71 BPM    Atrial Rate 71 BPM    PR Interval 164 ms    QRS Duration 92 ms    QT Interval 418 ms    QTC Calculation 454 ms    Calculated P Axis 48 degrees    Calculated R Axis -24 degrees    Calculated T Axis 47 degrees    Narrative    Normal sinus rhythm  Nonspecific T wave abnormality  Abnormal ECG      Confirmed by Genia Harold, Hemant ([id number]) on 02/17/2023 5:49:38 PM     Results for orders placed or performed during the hospital encounter of 02/16/23   XR AP MOBILE CHEST     Status: None    Narrative    History: Chest pain and shortness of breath.    Single AP portable view of the chest is obtained. The heart size is normal following prior median sternotomy. There is calcification of the thoracic aorta.. The lungs are clear with no acute pulmonary infiltrates or effusions. If the patient's clinical symptoms persist, a followup PA and lateral chest series is recommended.      Impression    1. No acute pulmonary process. No change from 12/24/2021.        Radiologist location ID: [id number]     XR KNEE LEFT 4 OR MORE VIEWS     Status: None    Narrative    Left knee:    CLINICAL HISTORY: Left knee instability with history of previous fall 5 days ago.    4 views of the left knee were obtained and compared with the prior exam of 08/31/2019.Marland Kitchen The findings are listed below.      Impression    1. Osteopenia but no definite acute fracture or dislocation.  2. Moderate tricompartmental osteoarthritis with narrowing of all joint compartments most pronounced at the patellofemoral joint where there is some posterior spurring.  3. Heavy arterial vascular calcification is noted as well as probable phlebolith in a superficial varicosity medially.  4. No definite joint effusion is seen.      Radiologist location ID: [id number]     XR HIP LEFT W PELVIS 2-3 VIEWS     Status: None    Narrative    History: Fall 5 days ago.    2 views of the left hip are obtained. No acute fracture is seen. The left hip joint spaces well-maintained. There is no significant osteoarthritis. Mild degenerative changes are seen at the right hip. Vascular calcifications are seen in the lower abdomen and pelvis.      Impression    1. No acute bony abnormality or significant degenerative joint disease involving either hip.      Radiologist location ID: [id number]                Paulita Cradle, MD

## 2023-02-17 NOTE — Progress Notes (Deleted)
Monique [name].  [address]  [place] [address]  [phone]    Date:   02/18/2023  Name: Monique [name]  Age: 57 y.o.    REASON FOR VISIT:   No chief complaint on file.     HISTORY OF PRESENTING ILLNESS:    Monique [name] is a 57 y.o. female with cardiac history significant for known coronary artery disease and previously demonstrated chronic occlusion of the left internal carotid artery.  She states that she is doing well.  She complains of fatigue.  She has been carrying for elderly family member who had knee surgery.  She continues to smoke up to a pack a day.  She isn't doing any regular exercise other than taking care the family member and associated housework.  She has had no chest pains and reports no change in her functional capacity or symptoms.     History:  coronary artery disease with bypass surgery in 2012 with LIMA to LAD and SVG to OM.  Approximately 4 months after surgery the patient had acute myocardial infarction and required percutaneous intervention by Dr. Laurin Coder.   The patient had a 2.75 X 12 Promus stent placed in the proximal IMA graft and then a 2.75 X 8 Promus stent in the distal portion of the graft.  A 2.75 X 8 Promus stent was placed at the ostium of the LAD with plaque shifting to the circumflex.  A 3.5 X 8 Promus stent was then deployed in the left main extending into the left circumflex with final kissing balloon angioplasty of the LAD and circumflex.   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  The right ICA has less than 50% stenosis.  Unfortunately despite her significant cardiovascular history the patient continues to smoke.        Stress test 11-2019:  Normal perfusion.  EF 50%  Echocardiogram 11/09/2019: EF 60-65%, trivial aortic regurgitation      The patient presents today for a follow-up appointment.      No current outpatient  medications on file.     Allergies   Allergen Reactions    Capron Dm [Pyrilamine-Dextromethorphan]  Other Adverse Reaction (Add comment)     LIGHT HEADED    Other      Coban dressing caused skin break down    Tylenol W Codeine [Acetaminophen-Codeine]  Other Adverse Reaction (Add comment)     pt states that one time she had chest pains with this med, but dr. thought it was because she had not eaten     Past Medical History:   Diagnosis Date    Anxiety     Arthritis     Back problem     DDD    Beta blocker prescribed for left ventricular systolic dysfunction     Blood thinned due to long-term anticoagulant use     Carotid stenosis     occlusion of left ICA    Chest pain 04/03/2018    Chronic back pain     Chronic obstructive pulmonary disease, unspecified COPD type (CMS [place]) 12/31/2022    Coronary artery disease involving native coronary artery of native heart     DDD (degenerative disc disease), lumbar     "entire spine"    Ear piercing     GERD (gastroesophageal reflux disease)     controlled with omeprazole    H/O complete eye  exam     2 years, Dr. [name], at [facility]    History of dental examination     dentures upper and lower    HTN     Hx of coronary artery bypass graft     2012    Hx of gastric bypass     Hypercholesterolemia     Hyperlipidemia     Hypothyroid     MI (myocardial infarction) (CMS [place]) 2012    Neck problem     herniated cervical disc    Obesity (BMI 30-39.9)     Peripheral vascular disease, unspecified (CMS [place]) 01/03/2021    Solitary nodule of right lobe of thyroid 03/30/2018    Incidental finding on MRI thoracic spine. [place] ordered.     Stented coronary artery     x4    Stroke (CMS [facility])     2002    Tattoo     Left breast, Right shoulder    Thyroid disorder     Thyroid nodule     Type 2 diabetes mellitus (CMS [facility])     HGA1C 10.4 on 12/19/21    Uncontrolled type 2 diabetes mellitus, without long-term current use of insulin     Xanthelasma          Past Surgical History:   Procedure Laterality Date     ENDOMETRIAL ABLATION  1998    HX ANKLE FRACTURE [place]    Right, Doran Durand procedure    HX CATARACT REMOVAL Bilateral 2013    r and l eyes with implants    HX CESAREAN SECTION  06/20/2000    x3, 11/21/86, 11/10/84    HX CHOLECYSTECTOMY  1988    HX CORONARY ARTERY BYPASS GRAFT  03/31/2011    cardiac, 2 vessel, Dr. [name], [facility]    HX CORONARY STENT PLACEMENT  2012    x4 stents    HX GASTRIC SLEEVE  02/2017    [place], [place], Dr. [name] HEART CATHETERIZATION  2012    massive heart attack and stents - [facility]    HX THYROID BIOPSY      HX THYROIDECTOMY  101/01/2018    HX TONSILLECTOMY      as child    HX YAG Left 09/20/2013         Family Medical History:       Problem Relation (Age of Onset)    Atrial fibrillation Sister    Colon Cancer Father    Diabetes Mother, Maternal Aunt    Heart Disease Sister, Maternal Grandfather    Hypertension (High Blood Pressure) Mother    Lung Cancer Maternal Grandfather    MI <80 years of age Mother            Social History     Socioeconomic History    Marital status: Married     Spouse name: [name]    Number of children: 3    Years of education: completed 11th grade   Occupational History    Occupation: disabled   Tobacco Use    Smoking status: Every Day     Current packs/day: 0.50     Average packs/day: 0.5 packs/day for 43.2 years (21.6 ttl pk-yrs)     Types: Cigarettes     Start date: 107    Smokeless tobacco: Never   Vaping Use    Vaping status: Never Used   Substance and Sexual Activity    Alcohol use: No    Drug use:  No    Sexual activity: Yes     Partners: Male   Other Topics Concern    Ability to Walk 1 Flight of Steps without SOB/CP Yes    Routine Exercise No    Ability to Walk 2 Flight of Steps without SOB/CP Yes    Ability To Do Own ADL's Yes    Uses Walker No    Other Activity Level Yes     Comment: on the go all the time - does cleaning, grocery shopping    Uses Cane No   Social History Narrative    Living situation: lives at home with husband, [name], and  daughter.  1 dog named Duke and 1 cat named Lucky.    Nutrition:  Eats 5 small meals d/t gastric sleeve surgery.     Caffeine use: none, decaf coffee    Exercise: stays busy around house. No formal form of exercise    Seatbelt use: sometimes    Fire extinguishers in the home: yes    Smoke alarms in the home: yes    Carbon monoxide detectors in the home: yes    Nancy Fetter exposure: sunglasses        Nasiyah would like for husband, Navia Yanke,  to speak for her in the event she would become incapacitated.    Full code.      Social Determinants of Health     Social Connections: Medium Risk (02/17/2023)    Social Connections     SDOH Social Isolation: 3 to 5 times a week       REVIEW OF SYSTEMS:    ROS       PHYSICAL EXAM:  Vitals reviewed. LMP  (LMP Unknown)       Vitals Filed    No data found in the last 1 encounters.         Physical Exam     BASIC METABOLIC PANEL  Lab Results   Component Value Date    SODIUM 136 02/16/2023    POTASSIUM 3.7 02/16/2023    CHLORIDE 104 02/16/2023    CO2 15 (L) 02/16/2023    ANIONGAP 17 (H) 02/16/2023    BUN 14 02/16/2023    CREATININE 1.25 (H) 02/16/2023    BUNCRRATIO 11 02/16/2023    GFR 50 (L) 02/16/2023    CALCIUM 9.2 02/16/2023    GLUCOSENF 177 (H) 05/08/2021          MAGNESIUM  Lab Results   Component Value Date    MAGNESIUM 1.8 02/16/2023         Lab Results   Component Value Date    CHOLESTEROL 272 (H) 01/30/2023    HDLCHOL 44 (L) 01/30/2023    LDLCHOL 206 (H) 01/30/2023    TRIG 121 01/30/2023        HEMOGLOBIN A1C   Date Value Ref Range Status   01/30/2023 10.9 (H) <5.7 % Final     Comment:     Hemoglobin A1c Ranges  Non-Diabetic:  <5.7  Increased Risk (pre-diabetic) for impaired glucose tolerance:  5.7 - 6.4  Consistent with diabetes (in not previously established diabetic patient:  > or = 6.5       POCT HGB A1C   Date Value Ref Range Status   07/04/2020 9.9 (A) 4 - 6 % Final       ASSESSMENT:  No diagnosis found.    PLAN:  1.  Coronary artery disease:  2 vessel bypass in 2012 and  required percutaneous intervention in  the ensuing years.  This does include stenting of the internal mammary graft as well as the left main extending into the left circumflex and LAD.  Doing well with no anginal symptoms.  Continue aggressive treatment underlying cardiovascular risk factors no anginal symptoms but will discuss need for cardiac testing for surveillance at her follow-up visit.  Encouraged smoking cessation.  2. Diabetes mellitus:  Continue diet and pharmacological treatment with goal A1c below 7.0.  Follows with PCP and Endocrinology.  From her description it appears that she is being evaluated for a functioning adrenal adenoma what sounds like a Cortrosyn stim test   3. Hypertension:  Blood pressure is well controlled on current regimen with no changes necessary  4. Tobacco abuse:  Patient counseled once again on importance of smoking cessation.  5. Hyperlipidemia:  No recent lipid panel.  Patient states that she has been busy and has had time to get her blood work completed.  She agrees to do so in the near future and has a follow-up appointment with her primary care provider, Dr. Russ Halo next month.  6. Carotid artery disease:  No TIA or stroke-like symptoms.  Patient has follow-up with vascular surgery and carotid duplex scheduled next month.  Duplex in 2020 reported no flow in the left internal carotid artery and less than 50% right ICA.  Continue to follow with vascular surgery in aggressive treatment of underlying cardiovascular risk factors    We will plan to follow up in *** for continued care and management of chronic illnesses including:     No orders of the defined types were placed in this encounter.      No follow-ups on file.    Plan was discussed and patient verbalized understanding. All questions were answered to the patient's satisfaction. Advised patient to call with any changes in symptoms.    Electronically signed by   Gaylyn Lambert, FNP ***    Cardiology, Medical Office  [address]   [address]   [place], [place] [address]  Dept: [phone]  Dept Fax: [phone]    This note may have been partially generated using MModal Fluency Direct system, and there may be some incorrect words, spellings, and punctuation that were not noted in checking the note before saving, though effort was made to avoid such errors.

## 2023-02-17 NOTE — Care Plan (Signed)
Problem: Adult Inpatient Plan of Care  Goal: Plan of Care Review  Flowsheets (Taken 02/17/2023 0455)  Plan of Care Reviewed With: patient  Goal: Patient-Specific Goal (Individualized)  Flowsheets  Taken 02/17/2023 0455  Individualized Care Needs: no c/o sob since admission tonight  Taken 02/17/2023 0010  Individualized Care Needs: find cause of sob  Anxieties, Fears or Concerns: none stated  Patient-Specific Goals (Include Timeframe): dc asap  Goal: Absence of Hospital-Acquired Illness or Injury  Intervention: Identify and Manage Fall Risk  Recent Flowsheet Documentation  Taken 02/17/2023 0143 by Riley Lam, RN  Safety Promotion/Fall Prevention: activity supervised     Problem: Fall Injury Risk  Goal: Absence of Fall and Fall-Related Injury  Intervention: Promote Injury-Free Environment  Recent Flowsheet Documentation  Taken 02/17/2023 0143 by Riley Lam, RN  Safety Promotion/Fall Prevention: activity supervised   Patient sleeping, new admission tonight dx weakness, chest pressure, sob, troponin neg. X3, no c/o chest pressure or sob since admission, dehydration, nss infusing at 100 cc/hr, room air, vs stable, sinus rhythm, no problems since admission tonight.

## 2023-02-17 NOTE — Care Plan (Signed)
Patient sitting up in bed. Denies pain or SOB. Resp even and unlabored. NS infusing without difficulty. No s/s of distress noted. No needs or concerns voiced at this time. Will continue to monitor. Sedonia Small, RN       Problem: Adult Inpatient Plan of Care  Goal: Plan of Care Review  Outcome: Ongoing (see interventions/notes)  Goal: Patient-Specific Goal (Individualized)  Outcome: Ongoing (see interventions/notes)  Flowsheets (Taken 02/17/2023 1000)  Individualized Care Needs: IV fluids, monitor potassium  Anxieties, Fears or Concerns: None voiced  Patient-Specific Goals (Include Timeframe): To go home  Plan of Care Reviewed With: patient  Goal: Absence of Hospital-Acquired Illness or Injury  Outcome: Ongoing (see interventions/notes)  Intervention: Identify and Manage Fall Risk  Recent Flowsheet Documentation  Taken 02/17/2023 1000 by Sedonia Small, RN  Safety Promotion/Fall Prevention:   fall prevention program maintained   safety round/check completed  Intervention: Prevent Skin Injury  Recent Flowsheet Documentation  Taken 02/17/2023 1000 by Sedonia Small, RN  Body Position: supine, head elevated  Skin Protection:   adhesive use limited   tubing/devices free from skin contact  Intervention: Prevent and Manage VTE (Venous Thromboembolism) Risk  Recent Flowsheet Documentation  Taken 02/17/2023 1000 by Sedonia Small, RN  VTE Prevention/Management:   ambulation promoted   anticoagulant therapy maintained  Goal: Optimal Comfort and Wellbeing  Outcome: Ongoing (see interventions/notes)  Intervention: Provide Person-Centered Care  Recent Flowsheet Documentation  Taken 02/17/2023 1000 by Sedonia Small, RN  Trust Relationship/Rapport:   care explained   reassurance provided  Goal: Rounds/Family Conference  Outcome: Ongoing (see interventions/notes)     Problem: Health Knowledge, Opportunity to Enhance (Adult,Obstetrics,Pediatric)  Goal: Knowledgeable about Health Subject/Topic  Description: Patient will demonstrate the  desired outcomes by discharge/transition of care.  Outcome: Ongoing (see interventions/notes)     Problem: Fall Injury Risk  Goal: Absence of Fall and Fall-Related Injury  Outcome: Ongoing (see interventions/notes)  Intervention: Promote Injury-Free Environment  Recent Flowsheet Documentation  Taken 02/17/2023 1000 by Sedonia Small, RN  Safety Promotion/Fall Prevention:   fall prevention program maintained   safety round/check completed

## 2023-02-17 NOTE — Nurses Notes (Signed)
Patient received to floor from ed awake, alert, oriented x4, admitted dx weakness, chest pressure, sob, room air, vs stable, telemetry placed sinus rhythm, troponin neg., dehydration, given nss fluid bolus in ed, denies chest pressure or problems at this time, admission to be done.

## 2023-02-17 NOTE — H&P (Addendum)
[facility]  Admission H&P      Date of Service:  02/17/2023  Monique [name] [facility] y.o. female  Date of Admission:  02/16/2023  Date of Birth:  February 08, 1966  PCP: Monique Beach, MD     Chief Complaint:  Chest pain    HPI: Monique [name] is a 57 y.o., White female with past medical history of nicotine dependence, coronary artery disease status post stenting and  bypass grafting in 2012, COPD, obesity with gastric bypass, hypothyroidism, hyperlipidemia, hypertension, diabetes mellitus type 2, and chronic pain on opioids.  She presented to [facility] last week for concern of chest pain, she was discharged on 02/12/2023 after having a normal stress test cardiac workup.  After return home her symptoms continued and so she presented to emergency room on 02/16/2023 for evaluation.  She endorsed concurrent left leg weakness.    In the emergency room her vitals were within normal limits, troponins were negative, imaging showed no acute process, he EKG showed normal sinus rhythm.  Abnormal labs included an anion gap of 17, creatinine of 1.25 with a baseline less than 1, and blood glucose of 170.    On interview she confirms the story as above, she states that her chest pain is substernal and radiates to her thoracic spine.  She rates the pain as mild-to-moderate this morning.  She endorses an appetite, she has had little p.o. intake since presenting to the emergency room yesterday.    ROS:   Constitutional: No fever, chills  Skin: No rashes or lesions  HENT: No sore throat, ear pain, or difficulty swallowing  Eyes: No vision changes, redness, discharge  Cardio: No chest pain, palpitations   Respiratory: No cough, wheezing, chronic SOB  GI:  No nausea or vomiting. No diarrhea or constipation. No abdominal pain  GU:  No dysuria, hematuria, polyuria  MSK: No new joint pain.  No new neck or back pain  Neuro: No numbness, tingling, global and left leg weakness.  No headache  All other systems  reviewed and are negative, unless commented on in the HPI.      History:    Past Medical:    Past Medical History:   Diagnosis Date    Anxiety     Arthritis     Back problem     DDD    Beta blocker prescribed for left ventricular systolic dysfunction     Blood thinned due to long-term anticoagulant use     Carotid stenosis     occlusion of left ICA    Chest pain 04/03/2018    Chronic back pain     Chronic obstructive pulmonary disease, unspecified COPD type (CMS [place]) 12/31/2022    Coronary artery disease involving native coronary artery of native heart     DDD (degenerative disc disease), lumbar     "entire spine"    Ear piercing     GERD (gastroesophageal reflux disease)     controlled with omeprazole    H/O complete eye exam     2 years, Dr. [name], at [facility]    History of dental examination     dentures upper and lower    HTN     Hx of coronary artery bypass graft     2012    Hx of gastric bypass     Hypercholesterolemia     Hyperlipidemia     Hypothyroid     MI (myocardial infarction) (CMS [facility]) 2012    Neck problem  herniated cervical disc    Obesity (BMI 30-39.9)     Peripheral vascular disease, unspecified (CMS [place]) 01/03/2021    Solitary nodule of right lobe of thyroid 03/30/2018    Incidental finding on MRI thoracic spine. [place] ordered.     Stented coronary artery     x4    Stroke (CMS [facility])     2002    Tattoo     Left breast, Right shoulder    Thyroid disorder     Thyroid nodule     Type 2 diabetes mellitus (CMS [facility])     HGA1C 10.4 on 12/19/21    Uncontrolled type 2 diabetes mellitus, without long-term current use of insulin     Xanthelasma      Past Surgical:    Past Surgical History:   Procedure Laterality Date    ENDOMETRIAL ABLATION  1998    HX ANKLE FRACTURE [place]    Right, Doran Durand procedure    HX CATARACT REMOVAL Bilateral 2013    r and l eyes with implants    HX CESAREAN SECTION  06/20/2000    x3, 11/21/86, 11/10/84    HX CHOLECYSTECTOMY  1988    HX CORONARY ARTERY BYPASS GRAFT  03/31/2011     cardiac, 2 vessel, Dr. [name], [facility]    HX CORONARY STENT PLACEMENT  2012    x4 stents    HX GASTRIC SLEEVE  02/2017    [place], [place], Dr. [name] HEART CATHETERIZATION  2012    massive heart attack and stents - [facility]    HX THYROID BIOPSY      HX THYROIDECTOMY  101/01/2018    HX TONSILLECTOMY      as child    HX YAG Left 09/20/2013     Family:    Family Medical History:       Problem Relation (Age of Onset)    Atrial fibrillation Sister    Colon Cancer Father    Diabetes Mother, Maternal Aunt    Heart Disease Sister, Maternal Grandfather    Hypertension (High Blood Pressure) Mother    Lung Cancer Maternal Grandfather    MI <49 years of age Mother          Social:   reports that she has been smoking cigarettes. She started smoking about 43 years ago. She has a 21.6 pack-year smoking history. She has never used smokeless tobacco. She reports that she does not drink alcohol and does not use drugs.    Allergies   Allergen Reactions    Capron Dm [Pyrilamine-Dextromethorphan]  Other Adverse Reaction (Add comment)     LIGHT HEADED    Other      Coban dressing caused skin break down    Tylenol W Codeine [Acetaminophen-Codeine]  Other Adverse Reaction (Add comment)     pt states that one time she had chest pains with this med, but dr. thought it was because she had not eaten       Medications Prior to Admission       Prescriptions    aspirin (ECOTRIN) 81 mg Oral Tablet, Delayed Release (E.C.)    Take 1 Tablet (81 mg total) by mouth Once a day    azithromycin (ZITHROMAX) 250 mg Oral Tablet    Take 500 mg (2 tab) on day 1; take 250 mg (1 tab) on days 2-5.    Patient not taking:  Reported on 02/17/2023    Blood Sugar Diagnostic (ACCU-CHEK GUIDE  TEST STRIPS) Does not apply Strip    TEST BLOOD SUGAR THREE TIMES DAILY    Blood-Glucose Meter (ACCU-CHEK AVIVA PLUS METER) Misc    1 Kit Three times a day    clopidogreL (PLAVIX) 75 mg Oral Tablet    TAKE 1 TABLET EVERY DAY    cyanocobalamin (VITAMIN B12) 1,000 mcg/mL  Injection Solution    INJECT 1 ML (1,000 MCG TOTAL) UNDER THE SKIN EVERY 30 DAYS    diclofenac sodium (VOLTAREN) 1 % Gel    Apply 2 g topically Three times a day as needed    docusate sodium (COLACE) 100 mg Oral Capsule    TAKE 1 CAPSULE TWICE DAILY    empagliflozin (JARDIANCE) 10 mg Oral Tablet    Take 1 Tablet (10 mg total) by mouth Once a day    ergocalciferol, vitamin D2, (DRISDOL) 1,250 mcg (50,000 unit) Oral Capsule    TAKE 1 CAPSULE BY MOUTH EVERY WEEK    fluticasone propionate (FLONASE) 50 mcg/actuation Nasal Spray, Suspension    Administer 2 Sprays into each nostril Once a day    Patient not taking:  Reported on 02/17/2023    furosemide (LASIX) 40 mg Oral Tablet    TAKE 1 TABLET EVERY DAY    HYDROcodone-acetaminophen (NORCO) 10-325 mg Oral Tablet    Take 1 Tablet by mouth Every 8 hours as needed for Pain [name] with [facility]    Lancets (ACCU-CHEK SOFTCLIX LANCETS) Misc    To use daily    levothyroxine (SYNTHROID) 25 mcg Oral Tablet    TAKE 1 TABLET ONE TIME DAILY    metoprolol succinate (TOPROL-XL) 25 mg Oral Tablet Sustained Release 24 hr    Take 0.5 Tablets (12.5 mg total) by mouth Once a day    NARCAN 4 mg/actuation Nasal Spray, Non-Aerosol    PT STATED SHE HAS NARCAN AT HOME BUT IS NOT TAKING IT    Needle, Disp, 25 G 25 gauge x 1" Needle    1 mL Every 30 days    niacin (NIASPAN) 500 mg Oral Tablet Sustained Release 24 hr    TAKE 1 TABLET EVERY DAY    nitroGLYCERIN (NITROSTAT) 0.4 mg Sublingual Tablet, Sublingual    Place 1 Tablet (0.4 mg total) under the tongue Every 5 minutes as needed for Chest pain for 3 doses over 15 minutes    omeprazole (PRILOSEC) 40 mg Oral Capsule, Delayed Release(E.C.)    TAKE 1 CAPSULE EVERY DAY    ondansetron (ZOFRAN ODT) 8 mg Oral Tablet, Rapid Dissolve    DISSOLVE 1 TABLET ON THE TONGUE EVERY 8 HOURS AS NEEDED FOR NAUSEA OR VOMITING    OZEMPIC 2 mg/dose (8 mg/3 mL) Subcutaneous Pen Injector    Inject 2 mg under the skin Every 7 days    pioglitazone  (ACTOS) 15 mg Oral Tablet    Take 1 Tablet (15 mg total) by mouth Once a day    PRENATAL PLUS, CALCIUM CARB, 27 mg iron- 1 mg Oral Tablet    Take 1 Tablet by mouth Once a day    rosuvastatin (CRESTOR) 40 mg Oral Tablet    Take 1 Tablet (40 mg total) by mouth Once a day TAKE 1 TABLET BY MOUTH ONCE DAILY    sennosides-docusate sodium (SENOKOT-S) 8.6-50 mg Oral Tablet    Take 1 Tablet by mouth Every evening    Patient not taking:  Reported on 02/17/2023    venlafaxine (EFFEXOR XR) 37.5 mg Oral Capsule, Sust. Release 24  hr    TAKE 1 CAPSULE ONE TIME DAILY    ZYRTEC-D 5-120 mg Oral Tablet Sustained Release 12 hr    Take 1 Tablet by mouth Once a day          Current inpatient medications:  acetaminophen (TYLENOL) tablet, 650 mg, Oral, Q4H PRN  aspirin (ECOTRIN) enteric coated tablet 81 mg, 81 mg, Oral, Daily  clopidogrel (PLAVIX) 75 mg tablet, 75 mg, Oral, Daily  dapagliflozin (FARXIGA) tablet, 10 mg, Oral, Daily  diclofenac (VOLTAREN) 1% topical gel, 2 g, Apply Topically, 3x/day PRN  docusate sodium (COLACE) capsule, 100 mg, Oral, 2x/day  enoxaparin PF (LOVENOX) 40 mg/0.4 mL SubQ injection, 40 mg, Subcutaneous, Q24H  fluticasone (FLONASE) 50 mcg per spray nasal spray, 2 Spray, Each Nostril, Daily  HYDROcodone-acetaminophen (NORCO) 10-325 mg per tablet, 1 Tablet, Oral, Q8H PRN  ipratropium-albuterol 0.5 mg-3 mg(2.5 mg base)/3 mL Solution for Nebulization, 3 mL, Nebulization, Q4H PRN  levothyroxine (SYNTHROID) tablet, 25 mcg, Oral, Daily  magnesium hydroxide (MILK OF MAGNESIA) '400mg'$  per 88m oral liquid, 15 mL, Oral, Daily PRN  metoprolol succinate (TOPROL-XL) 24 hr extended release tablet, 12.5 mg, Oral, Daily  niacin (NIASPAN) extended release tablet, 500 mg, Oral, Daily  NS flush syringe, 3 mL, Intracatheter, Q8HRS  NS flush syringe, 3 mL, Intracatheter, Q1H PRN  NS flush syringe, 3 mL, Intracatheter, Q8HRS  NS flush syringe, 3 mL, Intracatheter, Q1H PRN  NS premix infusion, , Intravenous, Continuous  ondansetron (ZOFRAN)  2 mg/mL injection, 4 mg, Intravenous, Q6H PRN  pantoprazole (PROTONIX) delayed release tablet, 40 mg, Oral, Daily  pioglitazone (ACTOS) tablet, 15 mg, Oral, Daily  prenatal (TRINATAL) vitamin, 1 Tablet, Oral, Daily  rosuvastatin (CRESTOR) tablet, 40 mg, Oral, Daily  semaglutide Pen Injector 2 mg, 2 mg, Subcutaneous, Q7 Days  sennosides-docusate sodium (SENOKOT-S) 8.6-'50mg'$  per tablet, 1 Tablet, Oral, QPM  venlafaxine (EFFEXOR XR) 24 hr extended release capsule, 37.5 mg, Oral, Daily         Physical exam:  Vitals:    02/16/23 2230 02/16/23 2337 02/16/23 2348 02/17/23 0809   BP: 98/62 100/74 100/74 124/71   Pulse: 81 78 78 67   Resp: '16 16 16 18   '$ Temp:  36.5 C (97.7 F) 36.5 C (97.7 F) 36.7 C (98.1 F)   SpO2: 94% 98%  95%   Weight:   91.2 kg (201 lb)    Height:   1.702 m ('5\' 7"'$ )    BMI:   31.55      GENERAL:   Pt is a pleasant, well-nourished, well-developed 57y.o. female who is in NAD. Appears stated age  H63  head normocephalic, symmetrical facies. EOM intact b/l. PERRLA. Sclera non-icteric, non-injected. No rhinorrhea. Oropharyngeal mucous membranes are moist  w/o erythema/exudates   NECK: supple. No masses, lymphadenopathy, JVD on exam. Trachea midline, no thyromegaly   CV: S1, S2. No murmurs, rubs, gallops.     LUNGS: CTAB, No rhonchi, rales, wheezes.   GI: (+) BS in all 4 quadrants. soft, NT/ND. No rigidity/ guarding/ rebound. No organomegaly/masses, or abdominal bruits  MSK: Spontaneous normal AROM of major joints as observed during exam. No joint effusions/ swelling/ deformities.   No BLE edema, clubbing, or cyanosis.   2+ radial pulse present b/l.   + 2 reflexes Brachioradialis and patellar bilaterally.   SKIN: No significant lesions, rashes, ecchymoses noted.   NEURO: AAOx4, CN grossly intact. Sensation intact b/l. No focal deficits.  PSYCH: Mood, behavior and affect normal w/ Intact judgement & insight  Labs:     Results for orders placed or performed during the hospital encounter of 02/16/23  (from the past 24 hour(s))   TROPONIN-I - TO BE DRAWN NOW   Result Value Ref Range    TROPONIN-I HS <2.7 <=14.0 ng/L ng/L   TROPONIN-I - TO BE DRAWN IN ONE HOUR AFTER INITIAL DRAW   Result Value Ref Range    TROPONIN-I HS <2.7 <=14.0 ng/L ng/L   TROPONIN-I - TO BE DRAWN IN 3 HOURS AFTER INITIAL DRAW   Result Value Ref Range    TROPONIN-I HS <2.7 <=14.0 ng/L ng/L   CBC/DIFF    Narrative    The following orders were created for panel order CBC/DIFF.  Procedure                               Abnormality         Status                     ---------                               -----------         ------                     CBC WITH [id number]:[id number]                                    Final result                 Please view results for these tests on the individual orders.   COMPREHENSIVE METABOLIC PANEL, NON-FASTING   Result Value Ref Range    SODIUM 136 136 - 145 mmol/L    POTASSIUM 3.7 3.5 - 5.1 mmol/L    CHLORIDE 104 96 - 111 mmol/L    CO2 TOTAL 15 (L) 22 - 30 mmol/L    ANION GAP 17 (H) 4 - 13 mmol/L    BUN 14 8 - 25 mg/dL    CREATININE 1.25 (H) 0.60 - 1.05 mg/dL    BUN/CREA RATIO 11 6 - 22    ALBUMIN 3.7 3.5 - 5.0 g/dL     CALCIUM 9.2 8.6 - 10.2 mg/dL    GLUCOSE 170 (H) 65 - 125 mg/dL    ALKALINE PHOSPHATASE 112 50 - 130 U/L    ALT (SGPT) 30 (H) 8 - 22 U/L    AST (SGOT)  35 8 - 45 U/L    BILIRUBIN TOTAL 0.3 0.3 - 1.3 mg/dL    PROTEIN TOTAL 7.2 6.4 - 8.3 g/dL    ESTIMATED GFR - FEMALE 50 (L) >=60 mL/min/BSA   PT/INR   Result Value Ref Range    PROTHROMBIN TIME 10.5 9.7 - 13.6 seconds    INR 0.89 <=5.00   PTT (PARTIAL THROMBOPLASTIN TIME)   Result Value Ref Range    APTT 31.1 26.0 - 39.0 seconds    Narrative    Therapeutic range for unfractionated heparin is 60-100 seconds.   MAGNESIUM   Result Value Ref Range    MAGNESIUM 1.8 1.8 - 2.6 mg/dL   D-DIMER   Result Value Ref Range    D-DIMER 211 <233 ng/mL DDU   B-TYPE NATRIURETIC PEPTIDE   Result Value Ref Range  BNP 11 <=99 pg/mL   CBC WITH DIFF   Result Value Ref  Range    WBC 9.2 3.7 - 11.0 x10^3/uL    RBC 5.08 3.85 - 5.22 x10^6/uL    HGB 15.3 11.5 - 16.0 g/dL    HCT 44.1 34.8 - 46.0 %    MCV 86.8 78.0 - 100.0 fL    MCH 30.1 26.0 - 32.0 pg    MCHC 34.7 31.0 - 35.5 g/dL    RDW-CV 13.2 11.5 - 15.5 %    PLATELETS 347 150 - 400 x10^3/uL    MPV 9.7 8.7 - 12.5 fL    NEUTROPHIL % 62.0 %    LYMPHOCYTE % 29.0 %    MONOCYTE % 8.0 %    EOSINOPHIL % 1.0 %    BASOPHIL % 0.0 %    NEUTROPHIL # 5.63 1.50 - 7.70 x10^3/uL    LYMPHOCYTE # 2.66 1.00 - 4.80 x10^3/uL    MONOCYTE # 0.75 0.20 - 1.10 x10^3/uL    EOSINOPHIL # <0.10 <=0.50 x10^3/uL    BASOPHIL # <0.10 <=0.20 x10^3/uL    IMMATURE GRANULOCYTE % 0.0 0.0 - 1.0 %    IMMATURE GRANULOCYTE # <0.10 <0.10 x10^3/uL   POC BLOOD GLUCOSE (RESULTS)   Result Value Ref Range    GLUCOSE, POC 91 Fasting: 80-130 mg/dL; 2 HR PC: <180 mg/dL mg/dl        Lab Results   Component Value Date    CREATININE 1.25 (H) 02/16/2023    CREATININE 0.98 12/17/2021    CREATININE 0.89 06/24/2021    CREATININE 0.90 05/08/2021    CREATININE 1.03 07/12/2020    CREATININE 0.75 01/30/2020    CREATININE 1.20 09/16/2018    CREATININE 0.80 08/30/2018    CREATININE 0.90 04/15/2018    CREATININE 0.80 04/04/2018       Lab Results   Component Value Date    HA1C 10.9 (H) 01/30/2023    HA1C 10.4 (H) 12/19/2021    HA1C 9.0 (H) 06/24/2021    HA1C 10.4 (H) 09/29/2018    HA1C 9.6 (H) 03/29/2018       Imaging Studies:    XR AP MOBILE CHEST   Final Result   1. No acute pulmonary process. No change from 12/24/2021.            Radiologist location ID: [id number]           Assessment/Plan:     Active Hospital Problems    Diagnosis    Primary Problem: Weakness     Patient is an 57 year old female admitted for re-evaluation of recurrent chest pain.      Chest pain:  Nonexertional   Normal troponins  Normal EKG,   Normal chest x-ray,   Will place on telemetry.    Continue home medications   Continue Crestor,   Continue beta-blocker,   Continue aspirin   Continue Plavix   Continue  Farxiga  Standard workup being unrevealing   (stress test at  [facility] last week)  Will consult Cardiology.  Consider stress echo.  Consider new catheterization.    Diabetes mellitus:   Chronic, uncontrolled.  Last A1c greater than 10.  Patient offered bolus of normal saline,  will continue gentle rehydration.  Will order sliding scale,   Continue Actos.  Patient remains on G LP 1, Ozempic.    Continue Farxiga    Acute kidney injury:   Likely secondary to volume contraction.   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  Continue rehydration.  Repeat labs in the a.m..        Other chronic conditions:  Continue home meds    Code Status:  Prior  Diet:  Diabetic cardiac diet  DVT/PE Prophylaxis:  Lovenox  Disposition Planning:  To be determined    Monique Beach, MD    This note was partially generated using MModal Fluency Direct system, and there may be some incorrect words, spellings, and punctuation that were not noted in checking the note before saving

## 2023-02-18 ENCOUNTER — Encounter (HOSPITAL_BASED_OUTPATIENT_CLINIC_OR_DEPARTMENT_OTHER): Payer: Self-pay | Admitting: Family

## 2023-02-18 ENCOUNTER — Other Ambulatory Visit: Payer: Self-pay | Admitting: Gastroenterology

## 2023-02-18 DIAGNOSIS — Z720 Tobacco use: Secondary | ICD-10-CM

## 2023-02-18 DIAGNOSIS — N179 Acute kidney failure, unspecified: Secondary | ICD-10-CM | POA: Diagnosis present

## 2023-02-18 DIAGNOSIS — E860 Dehydration: Principal | ICD-10-CM

## 2023-02-18 DIAGNOSIS — F17200 Nicotine dependence, unspecified, uncomplicated: Secondary | ICD-10-CM | POA: Diagnosis present

## 2023-02-18 LAB — COMPREHENSIVE METABOLIC PANEL, NON-FASTING
ALBUMIN: 2.9 g/dL — ABNORMAL LOW (ref 3.5–5.0)
ALKALINE PHOSPHATASE: 82 U/L (ref 50–130)
ALT (SGPT): 19 U/L (ref 8–22)
ANION GAP: 9 mmol/L (ref 4–13)
AST (SGOT): 21 U/L (ref 8–45)
BILIRUBIN TOTAL: 0.3 mg/dL (ref 0.3–1.3)
BUN/CREA RATIO: 13 (ref 6–22)
BUN: 11 mg/dL (ref 8–25)
CALCIUM: 8.4 mg/dL — ABNORMAL LOW (ref 8.6–10.2)
CHLORIDE: 110 mmol/L (ref 96–111)
CO2 TOTAL: 19 mmol/L — ABNORMAL LOW (ref 22–30)
CREATININE: 0.83 mg/dL (ref 0.60–1.05)
ESTIMATED GFR - FEMALE: 82 mL/min/BSA (ref >=60)
GLUCOSE: 100 mg/dL (ref 65–125)
POTASSIUM: 3.6 mmol/L (ref 3.5–5.1)
PROTEIN TOTAL: 5.4 g/dL — ABNORMAL LOW (ref 6.4–8.3)
SODIUM: 138 mmol/L (ref 136–145)

## 2023-02-18 LAB — CBC WITH DIFF
BASOPHIL #: <0.1 10*3/uL (ref <=0.20)
BASOPHIL %: 1 %
EOSINOPHIL #: 0.13 10*3/uL (ref <=0.50)
EOSINOPHIL %: 2 %
HCT: 36.9 % (ref 34.8–46.0)
HGB: 12.5 g/dL (ref 11.5–16.0)
IMMATURE GRANULOCYTE #: <0.1 10*3/uL (ref <0.10)
IMMATURE GRANULOCYTE %: 0 % (ref 0.0–1.0)
LYMPHOCYTE #: 3.85 10*3/uL (ref 1.00–4.80)
LYMPHOCYTE %: 49 %
MCH: 30 pg (ref 26.0–32.0)
MCHC: 33.9 g/dL (ref 31.0–35.5)
MCV: 88.7 fL (ref 78.0–100.0)
MONOCYTE #: 0.74 10*3/uL (ref 0.20–1.10)
MONOCYTE %: 10 %
MPV: 9.7 fL (ref 8.7–12.5)
NEUTROPHIL #: 2.89 10*3/uL (ref 1.50–7.70)
NEUTROPHIL %: 38 %
PLATELETS: 278 10*3/uL (ref 150–400)
RBC: 4.16 10*6/uL (ref 3.85–5.22)
RDW-CV: 13.2 % (ref 11.5–15.5)
WBC: 7.7 10*3/uL (ref 3.7–11.0)

## 2023-02-18 LAB — POC BLOOD GLUCOSE (RESULTS)
GLUCOSE, POC: 98 mg/dl (ref 80–130)
GLUCOSE, POC: 149 mg/dl (ref 80–130)

## 2023-02-18 MED ORDER — ZENPEP 40000-126000 UNITS PO CPEP: 2.0000 | ORAL_CAPSULE | Freq: Three times a day (TID) | ORAL | 5 refills | Status: DC

## 2023-02-18 MED ORDER — ZENPEP 40000-126000 UNITS PO CPEP: ORAL_CAPSULE | ORAL | 2 refills | Status: AC

## 2023-02-18 NOTE — Telephone Encounter (Signed)
Pt wants to do it 3 times a day. I have a refill her where pharmacy has sent stating where the Rx needs to be changed to 3 a day. Estill Bamberg stated 3 times a day because of the pt's complaint. Can you please send in a new one or I can get Loma Sousa to do it

## 2023-02-18 NOTE — Telephone Encounter (Signed)
I sent in prescription for her original dosing of 40,000 units. Take 2 with meals and 1 with snacks.

## 2023-02-18 NOTE — Telephone Encounter (Signed)
noted 

## 2023-02-18 NOTE — Addendum Note (Signed)
Addended by: Annitta Needs on: 02/18/2023 09:14 AM   Modules accepted: Orders

## 2023-02-18 NOTE — Discharge Summary (Addendum)
[facility]  DISCHARGE SUMMARY      PATIENT NAME:  Monique [name], Monique [name]  MRN:  [id number]  DOB:  06-15-1966    ENCOUNTER DATE:  02/16/2023  INPATIENT ADMISSION DATE: 02/16/2023  DISCHARGE DATE:  02/18/2023    ATTENDING PHYSICIAN: Cherly Beach, MD  SERVICE: [facility] MEDICINE   PRIMARY CARE PHYSICIAN: Cherly Beach, MD     Reason for Admission       Diagnosis    Weakness [141835]            DISCHARGE DIAGNOSIS:     Principal Problem:  Weakness    Active Hospital Problems    Diagnosis Date Noted    Principal Problem: Weakness [R53.1] 02/16/2023    Tobacco use disorder [F17.200] 02/18/2023      Resolved Hospital Problems    Diagnosis     AKI (acute kidney injury) (CMS HCC) [N17.9]     Dehydration [E86.0]      Active Non-Hospital Problems    Diagnosis Date Noted    Atherosclerosis of native artery of right lower extremity with rest pain (CMS [place]) 12/31/2022    Chronic obstructive pulmonary disease, unspecified COPD type (CMS [place]) 12/31/2022    Unspecified inflammatory spondylopathy, lumbar region (CMS [facility]) 07/17/2022    Other specified disorders of adrenal gland (CMS [place]) 07/17/2022    Left leg weakness 05/15/2021    Dizziness 05/15/2021    Chronic lumbar radiculopathy 05/15/2021    Hypertension associated with type 2 diabetes mellitus (CMS [place])  05/15/2021    Type 2 diabetes mellitus with complication, without long-term current use of insulin (CMS [place]) 05/15/2021    Major depressive disorder, recurrent, unspecified (CMS [place]) 01/03/2021    Peripheral vascular disease, unspecified (CMS [place]) 01/03/2021    Fracture of phalanx of finger of right hand 12/05/2019    Spinal pain 12/05/2019    Type 2 diabetes mellitus with hyperglycemia (CMS [place]) 12/05/2019    Upper respiratory infection 12/05/2019    Thyroid disorder 09/16/2019    UDS: 07/28/19 08/02/2019    Sinus congestion 12/07/2018    Morbid obesity (CMS [place]) 09/13/2018    Type 2 diabetes mellitus with hyperosmolarity without coma, without long-term current use  of insulin (CMS HCC)     Coronary artery disease involving native coronary artery of native heart     GERD (gastroesophageal reflux disease)     HTN (hypertension)     Hyperlipidemia     Constipation 04/15/2018    Thyroid nodule 04/15/2018    Chest pain 04/03/2018    DDD (degenerative disc disease), lumbar     Anxiety     Chronic back pain     Carotid stenosis     BMI 32.0-32.9,adult     H/O complete eye exam     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 thought it was because she had not eaten            DISCHARGE MEDICATIONS:     Current Discharge Medication List  START taking these medications.        Details   fluticasone propionate 50 mcg/actuation Spray, Suspension  Commonly known as: FLONASE   2 Sprays, Each Nostril, DAILY  Qty: 16 g  Refills: 0            CONTINUE these medications - NO CHANGES were made during your visit.        Details   Accu-Chek Guide test strips Strip  Generic drug: Blood Sugar Diagnostic   TEST BLOOD SUGAR THREE TIMES DAILY  Qty: 200 Each  Refills: 3     aspirin 81 mg Tablet, Delayed Release (E.C.)  Commonly known as: ECOTRIN   81 mg, Oral, DAILY  Refills: 0     Blood-Glucose Meter Misc  Commonly known as: Accu-Chek Aviva Plus Meter   1 Kit, Does not apply, 3 TIMES DAILY  Qty: 1 Each  Refills: 0     clopidogreL 75 mg Tablet  Commonly known as: PLAVIX   TAKE 1 TABLET EVERY DAY  Qty: 90 Tablet  Refills: 10     cyanocobalamin 1,000 mcg/mL Solution  Commonly known as: VITAMIN B12   1,000 mcg, Subcutaneous, EVERY 30 DAYS  Qty: 3 mL  Refills: 10     diclofenac sodium 1 % Gel  Commonly known as: VOLTAREN   2 g,  Apply Topically, 3 TIMES DAILY PRN  Qty: 1 Each  Refills: 3     docusate sodium 100 mg Capsule  Commonly known as: COLACE   TAKE 1 CAPSULE TWICE DAILY  Qty: 180 Capsule  Refills: 2     empagliflozin 10 mg Tablet  Commonly known as: JARDIANCE   10 mg, Oral, DAILY  Refills: 0     ergocalciferol (vitamin D2) 1,250 mcg (50,000 unit) Capsule  Commonly known as: DRISDOL   TAKE 1 CAPSULE BY MOUTH EVERY WEEK  Qty: 12 Capsule  Refills: 10     furosemide 40 mg Tablet  Commonly known as: LASIX   TAKE 1 TABLET EVERY DAY  Qty: 90 Tablet  Refills: 3     HYDROcodone-acetaminophen 10-325 mg Tablet  Commonly known as: NORCO   1 Tablet, Oral, EVERY 8 HOURS PRN, [name] with [place]  Refills: 0     Lancets Misc  Commonly known as: Accu-Chek Softclix Lancets   To use daily  Qty: 200 Each  Refills: 2     levothyroxine 25 mcg Tablet  Commonly known as: SYNTHROID   25 mcg, Oral, DAILY  Qty: 90 Tablet  Refills: 10     metoprolol succinate 25 mg Tablet Sustained Release 24 hr  Commonly known as: TOPROL-XL   12.5 mg, Oral, DAILY  Qty: 45 Tablet  Refills: 3     Narcan 4 mg/actuation Spray, Non-Aerosol  Generic drug: naloxone   PT STATED SHE HAS NARCAN AT HOME BUT IS NOT TAKING IT  Refills: 0     Needle (Disp) 25 G 25 gauge x 1" Needle   1 mL, Does not apply, EVERY 30 DAYS  Qty: 100 Each  Refills: 0     niacin 500 mg Tablet Sustained Release 24 hr  Commonly known as: NIASPAN   500 mg, Oral, DAILY  Qty: 90 Tablet  Refills: 3     nitroGLYCERIN 0.4 mg Tablet, Sublingual  Commonly known as: NITROSTAT   0.4 mg, Sublingual, EVERY 5 MIN PRN, for 3 doses over 15 minutes  Qty: 25 Tablet  Refills: 3     omeprazole 40 mg Capsule,  Delayed Release(E.C.)  Commonly known as: PRILOSEC   TAKE 1 CAPSULE EVERY DAY  Qty: 90 Capsule  Refills: 10     ondansetron 8 mg Tablet, Rapid Dissolve  Commonly known as: ZOFRAN ODT   DISSOLVE 1 TABLET ON THE TONGUE EVERY 8 HOURS AS NEEDED FOR NAUSEA OR VOMITING  Qty: 90 Tablet  Refills: 2     Ozempic  2 mg/dose (8 mg/3 mL) Pen Injector  Generic drug: semaglutide   2 mg, Subcutaneous, EVERY 7 DAYS  Refills: 0     pioglitazone 15 mg Tablet  Commonly known as: ACTOS   15 mg, Oral, DAILY  Refills: 0     Prenatal Plus (calcium carb) 27 mg iron- 1 mg Tablet  Generic drug: prenatal vitamin   1 Tablet, Oral, DAILY  Qty: 90 Tablet  Refills: 3     rosuvastatin 40 mg Tablet  Commonly known as: CRESTOR   40 mg, Oral, DAILY, TAKE 1 TABLET BY MOUTH ONCE DAILY  Qty: 90 Tablet  Refills: 2     Stimulant Laxative Plus 8.6-50 mg Tablet  Generic drug: sennosides-docusate sodium   1 Tablet, Oral, EVERY EVENING  Qty: 30 Tablet  Refills: 0     venlafaxine 37.5 mg Capsule, Sust. Release 24 hr  Commonly known as: EFFEXOR XR   37.5 mg, Oral, DAILY  Qty: 90 Capsule  Refills: 10     ZyrTEC-D 5-120 mg Tablet Sustained Release 12 hr  Generic drug: cetirizine-pseudoephedrine   1 Tablet, Oral, DAILY  Qty: 90 Tablet  Refills: 3            STOP taking these medications.      azithromycin 250 mg Tablet  Commonly known as: ZITHROMAX            Discharge med list refreshed?  YES        DISCHARGE INSTRUCTIONS:  Post-Discharge Follow Up Appointments       Thursday Apr 09, 2023    RETURN PATIENT VISIT with Cherly Beach, MD at  1:30 PM      Tuesday Jan 26, 2024    MOB CAROTID DUPLEX OP with CCM VASC 3 at 12:30 PM    Return Patient Visit with Provider, [facility] at  1:00 PM      Family Medicine, [facility], [place]  [place] [address]  [phone] Imaging Services, Medical Office Building B  Medical Office Building B, [place]  [address]  [place] [address]  [phone] Vascular [place] [facility], [place]  [place] [address]  [phone]             DISCHARGE INSTRUCTION - DIET     Diet: RESUME HOME DIET      DISCHARGE  INSTRUCTION - ACTIVITY - GRADUALLY INCREASE AS TOLERATED     Activity: GRADUALLY INCREASE ACTIVITY AS TOLERATED           [place] COURSE:  This is a 21 y.o., female   .  Who presented to the emergency room with concern of malaise.  She had been seen at [facility] and was discharged on 02/12/2023 for concern of chest pain.  Workup there was benign.  In the emergency room there was some evidence of dehydration and so the patient was bolused with fluid.  We are asked  to admit the patient for ongoing fluid resuscitation, evaluation, and Cardiology consult.    She tolerated IV fluids and had no difficulty with nausea vomiting or diarrhea.  Her cardiac enzymes were all within normal limits.  Cardiology saw the patient on 02/17/2023 and felt that the patient would benefit from smoking cessation and tighter control of blood glucose.  Recommended no additional cardiac evaluation or intervention at this time.  Recommended following up in the outpatient setting.    During her stay she complained of left leg weakness and knee pain.  She requested an x-ray be obtained to evaluate the knee.  This was completed.  Will be able to follow-up on this in the outpatient setting.    No med changes are being made.    Patient will need to see her diabetic specialist, she understands the need for tighter blood glucose control.      Billing note:  Patient appears to have had acute renal injury as evidenced by labs in the emergency room prior to fluid resuscitation.  She responded very well to fluid resuscitation has returned to her baseline.  Problem has resolved discharge.        Filed Vitals:    02/17/23 0950 02/17/23 1527 02/17/23 2310 02/18/23 0741   BP:  113/66  (!) 113/58   Pulse:  62  66   Resp: '16 18 18    '$ Temp:  36.5 C (97.7 F)  36.3 C (97.3 F)   SpO2:  95%  96%             Six-month weight curve.  Recent decline.  She has had intentional weight loss in addition to recent volume  depletion/ dehydration.      GENERAL:   Pt is a pleasant, well-nourished, well-developed 57 y.o. female who is in NAD. Appears stated age  89:  head normocephalic, symmetrical facies. EOM intact b/l. PERRLA. Sclera non-icteric, non-injected. No rhinorrhea. Oropharyngeal mucous membranes are moist  w/o erythema/exudates   NECK: supple. No masses, lymphadenopathy, JVD on exam. Trachea midline, no thyromegaly   CV: S1, S2. No murmurs, rubs, gallops.     LUNGS: CTAB, No rhonchi, rales, wheezes.   GI: (+) BS in all 4 quadrants. soft, NT/ND. No rigidity/ guarding/ rebound. No organomegaly/masses, or abdominal bruits  MSK: Spontaneous normal AROM of major joints as observed during exam. No joint effusions/ swelling/ deformities.   No BLE edema, clubbing, or cyanosis.   2+ radial pulse present b/l.   + 2 reflexes Brachioradialis and patellar bilaterally.   SKIN: No significant lesions, rashes, ecchymoses noted.   NEURO: AAOx4, CN grossly intact. Sensation intact b/l. No focal deficits.  PSYCH: Mood, behavior and affect normal w/ Intact judgement & insight       Results for orders placed or performed during the hospital encounter of 02/16/23 (from the past 48 hour(s))   TROPONIN-I - TO BE DRAWN NOW   Result Value Ref Range    TROPONIN-I HS <2.7 <=14.0 ng/L ng/L   TROPONIN-I - TO BE DRAWN IN ONE HOUR AFTER INITIAL DRAW   Result Value Ref Range    TROPONIN-I HS <2.7 <=14.0 ng/L ng/L   TROPONIN-I - TO BE DRAWN IN 3 HOURS AFTER INITIAL DRAW   Result Value Ref Range    TROPONIN-I HS <2.7 <=14.0 ng/L ng/L   CBC/DIFF    Narrative    The following orders were created for panel order CBC/DIFF.  Procedure  Abnormality         Status                     ---------                               -----------         ------                     CBC WITH [id number]                                    Final result                 Please view results for these tests on the individual orders.   COMPREHENSIVE  METABOLIC PANEL, NON-FASTING   Result Value Ref Range    SODIUM 136 136 - 145 mmol/L    POTASSIUM 3.7 3.5 - 5.1 mmol/L    CHLORIDE 104 96 - 111 mmol/L    CO2 TOTAL 15 (L) 22 - 30 mmol/L    ANION GAP 17 (H) 4 - 13 mmol/L    BUN 14 8 - 25 mg/dL    CREATININE 1.25 (H) 0.60 - 1.05 mg/dL    BUN/CREA RATIO 11 6 - 22    ALBUMIN 3.7 3.5 - 5.0 g/dL     CALCIUM 9.2 8.6 - 10.2 mg/dL    GLUCOSE 170 (H) 65 - 125 mg/dL    ALKALINE PHOSPHATASE 112 50 - 130 U/L    ALT (SGPT) 30 (H) 8 - 22 U/L    AST (SGOT)  35 8 - 45 U/L    BILIRUBIN TOTAL 0.3 0.3 - 1.3 mg/dL    PROTEIN TOTAL 7.2 6.4 - 8.3 g/dL    ESTIMATED GFR - FEMALE 50 (L) >=60 mL/min/BSA   PT/INR   Result Value Ref Range    PROTHROMBIN TIME 10.5 9.7 - 13.6 seconds    INR 0.89 <=5.00   PTT (PARTIAL THROMBOPLASTIN TIME)   Result Value Ref Range    APTT 31.1 26.0 - 39.0 seconds    Narrative    Therapeutic range for unfractionated heparin is 60-100 seconds.   MAGNESIUM   Result Value Ref Range    MAGNESIUM 1.8 1.8 - 2.6 mg/dL   D-DIMER   Result Value Ref Range    D-DIMER 211 <233 ng/mL DDU   B-TYPE NATRIURETIC PEPTIDE   Result Value Ref Range    BNP 11 <=99 pg/mL   CBC WITH DIFF   Result Value Ref Range    WBC 9.2 3.7 - 11.0 x10^3/uL    RBC 5.08 3.85 - 5.22 x10^6/uL    HGB 15.3 11.5 - 16.0 g/dL    HCT 44.1 34.8 - 46.0 %    MCV 86.8 78.0 - 100.0 fL    MCH 30.1 26.0 - 32.0 pg    MCHC 34.7 31.0 - 35.5 g/dL    RDW-CV 13.2 11.5 - 15.5 %    PLATELETS 347 150 - 400 x10^3/uL    MPV 9.7 8.7 - 12.5 fL    NEUTROPHIL % 62.0 %    LYMPHOCYTE % 29.0 %    MONOCYTE % 8.0 %    EOSINOPHIL % 1.0 %    BASOPHIL % 0.0 %    NEUTROPHIL # 5.63 1.50 - 7.70 x10^3/uL  LYMPHOCYTE # 2.66 1.00 - 4.80 x10^3/uL    MONOCYTE # 0.75 0.20 - 1.10 x10^3/uL    EOSINOPHIL # <0.10 <=0.50 x10^3/uL    BASOPHIL # <0.10 <=0.20 x10^3/uL    IMMATURE GRANULOCYTE % 0.0 0.0 - 1.0 %    IMMATURE GRANULOCYTE # <0.10 <0.10 x10^3/uL   LIPASE   Result Value Ref Range    LIPASE 31 10 - 60 U/L   CBC/DIFF    Narrative    The following  orders were created for panel order CBC/DIFF.  Procedure                               Abnormality         Status                     ---------                               -----------         ------                     CBC WITH [id number]                                    Final result                 Please view results for these tests on the individual orders.   COMPREHENSIVE METABOLIC PANEL, NON-FASTING   Result Value Ref Range    SODIUM 138 136 - 145 mmol/L    POTASSIUM 3.6 3.5 - 5.1 mmol/L    CHLORIDE 110 96 - 111 mmol/L    CO2 TOTAL 19 (L) 22 - 30 mmol/L    ANION GAP 9 4 - 13 mmol/L    BUN 11 8 - 25 mg/dL    CREATININE 0.83 0.60 - 1.05 mg/dL    BUN/CREA RATIO 13 6 - 22    ALBUMIN 2.9 (L) 3.5 - 5.0 g/dL     CALCIUM 8.4 (L) 8.6 - 10.2 mg/dL    GLUCOSE 100 65 - 125 mg/dL    ALKALINE PHOSPHATASE 82 50 - 130 U/L    ALT (SGPT) 19 8 - 22 U/L    AST (SGOT)  21 8 - 45 U/L    BILIRUBIN TOTAL 0.3 0.3 - 1.3 mg/dL    PROTEIN TOTAL 5.4 (L) 6.4 - 8.3 g/dL    ESTIMATED GFR - FEMALE 82 >=60 mL/min/BSA   CBC WITH DIFF   Result Value Ref Range    WBC 7.7 3.7 - 11.0 x10^3/uL    RBC 4.16 3.85 - 5.22 x10^6/uL    HGB 12.5 11.5 - 16.0 g/dL    HCT 36.9 34.8 - 46.0 %    MCV 88.7 78.0 - 100.0 fL    MCH 30.0 26.0 - 32.0 pg    MCHC 33.9 31.0 - 35.5 g/dL    RDW-CV 13.2 11.5 - 15.5 %    PLATELETS 278 150 - 400 x10^3/uL    MPV 9.7 8.7 - 12.5 fL    NEUTROPHIL % 38.0 %    LYMPHOCYTE % 49.0 %    MONOCYTE % 10.0 %    EOSINOPHIL % 2.0 %    BASOPHIL % 1.0 %    NEUTROPHIL #  2.89 1.50 - 7.70 x10^3/uL    LYMPHOCYTE # 3.85 1.00 - 4.80 x10^3/uL    MONOCYTE # 0.74 0.20 - 1.10 x10^3/uL    EOSINOPHIL # 0.13 <=0.50 x10^3/uL    BASOPHIL # <0.10 <=0.20 x10^3/uL    IMMATURE GRANULOCYTE % 0.0 0.0 - 1.0 %    IMMATURE GRANULOCYTE # <0.10 <0.10 x10^3/uL   POC BLOOD GLUCOSE (RESULTS)   Result Value Ref Range    GLUCOSE, POC 91 Fasting: 80-130 mg/dL; 2 HR PC: <180 mg/dL mg/dl   POC BLOOD GLUCOSE (RESULTS)   Result Value Ref Range    GLUCOSE, POC 157 Fasting:  80-130 mg/dL; 2 HR PC: <180 mg/dL mg/dl   POC BLOOD GLUCOSE (RESULTS)   Result Value Ref Range    GLUCOSE, POC 107 Fasting: 80-130 mg/dL; 2 HR PC: <180 mg/dL mg/dl   POC BLOOD GLUCOSE (RESULTS)   Result Value Ref Range    GLUCOSE, POC 151 Fasting: 80-130 mg/dL; 2 HR PC: <180 mg/dL mg/dl   POC BLOOD GLUCOSE (RESULTS)   Result Value Ref Range    GLUCOSE, POC 98 Fasting: 80-130 mg/dL; 2 HR PC: <180 mg/dL mg/dl   ]    Lab Results   Component Value Date    HA1C 10.9 (H) 01/30/2023       ECG 12 LEAD ONE TIME  Result Date: 02/17/2023  Normal sinus rhythm Nonspecific T wave abnormality Abnormal ECG  Confirmed by Modi, Hemant ([id number]) on 02/17/2023 5:49:38 PM    XR KNEE LEFT 4 OR MORE VIEWS  Result Date: 02/17/2023  Left knee: CLINICAL HISTORY: Left knee instability with history of previous fall 5 days ago. 4 views of the left knee were obtained and compared with the prior exam of 08/31/2019.Marland Kitchen The findings are listed below.     1. Osteopenia but no definite acute fracture or dislocation. 2. Moderate tricompartmental osteoarthritis with narrowing of all joint compartments most pronounced at the patellofemoral joint where there is some posterior spurring. 3. Heavy arterial vascular calcification is noted as well as probable phlebolith in a superficial varicosity medially. 4. No definite joint effusion is seen. Radiologist location ID: [id number]     XR HIP LEFT W PELVIS 2-3 VIEWS  Result Date: 02/17/2023  History: Fall 5 days ago. 2 views of the left hip are obtained. No acute fracture is seen. The left hip joint spaces well-maintained. There is no significant osteoarthritis. Mild degenerative changes are seen at the right hip. Vascular calcifications are seen in the lower abdomen and pelvis.     1. No acute bony abnormality or significant degenerative joint disease involving either hip. Radiologist location ID: [id number]     ECG 12 LEAD ONE TIME  Result Date: 02/17/2023  Normal sinus rhythm Nonspecific ST and T wave abnormality  Abnormal ECG  NO STEMI Reviewed by Daphane Shepherd MD at 1924 Confirmed by Daphane Shepherd [phone]), editor Gurney Maxin [phone]) on 02/17/2023 12:25:52 PM    ECG 12 LEAD ONE TIME  Result Date: 02/17/2023  Normal sinus rhythm Left axis deviation Possible Anterior infarct , age undetermined Abnormal ECG  NO STEMI Reviewed by Molli Hazard at 2025 Confirmed by Molli Hazard ([id number]), editor Gurney Maxin [phone]) on 02/17/2023 12:25:18 PM    XR AP MOBILE CHEST  Result Date: 02/16/2023  History: Chest pain and shortness of breath. Single AP portable view of the chest is obtained. The heart size is normal following prior median sternotomy. There is calcification of the thoracic aorta.. The lungs are clear with no acute  pulmonary infiltrates or effusions. If the patient's clinical symptoms persist, a followup PA and lateral chest series is recommended.     1. No acute pulmonary process. No change from 12/24/2021. Radiologist location ID: [id number]     CAROTID ARTERY DUPLEX  Result Date: 01/19/2023  Procedure: A duplex ultrasound examination of the extracranial carotid arteries was performed using 2D imaging with color and spectral Doppler for evaluation of atherosclerotic disease. Previous: These findings are unchanged from the previous examination dated 05/21/2021. Right: Right ICA: Less than 50% stenosis - Mild Left: Left ICA: known occlusion Conclusions: Right internal carotid artery with mild stenosis. 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 Recommendations: A 12 month followup is recommended. The patient will be seen in the office for followup recommendations.       CONDITION ON DISCHARGE:  A. Ambulation: Full ambulation  B. Self-care Ability: Complete  C. Cognitive Status Alert and Oriented x 3  D. DNR status at discharge: Full Code  E. Lace + Score: 53 (02/18/23 0532)       Advance Directive Information      Flowsheet Row Most Recent Value   Does the Patient have an Advance Directive? No, Information Offered and Refused             DISCHARGE DISPOSITION:    DISCHARGE PATIENT   Ordered at: 02/18/23 [id number]     Is there a planned readmission to acute care within 30 days?    No     Disposition:    [place], MD    Time spent on discharge greater than 30 minutes? Yes      Copies sent to Care Team         Relationship Specialty Notifications Start End    Cherly Beach, MD PCP - General FAMILY MEDICINE  11/22/19     Phone: [phone] Fax: [phone]         [address] [place] [address]    Paulita Cradle, MD PCP - Cardiologist [place]  Admissions 05/27/21     Phone: [phone] Fax: [phone]         [place] [address]    Parthenia Ames, MD Consulting Physician GASTROENTEROLOGY  09/15/17     Phone: [phone] Fax: [phone]         [address] [place]  [address]    Rosaria Ferries., MD Consulting Physician PAIN MANAGEMENT  09/15/17     Phone: [phone] Fax: [phone]         [address] [place] [address]            Referring providers can utilize [email or web address] to access their referred [place] patient's information.

## 2023-02-18 NOTE — Nurses Notes (Signed)
Patient sitting up in bed. No s/s of distress noted. Resp even and unlabored. No needs or concerns voiced at this time. Will continue to monitor. Sedonia Small, RN

## 2023-02-18 NOTE — Care Plan (Signed)
Problem: Adult Inpatient Plan of Care  Goal: Plan of Care Review  Outcome: Ongoing (see interventions/notes)  Goal: Patient-Specific Goal (Individualized)  Outcome: Ongoing (see interventions/notes)  Flowsheets (Taken 02/18/2023 0547)  Individualized Care Needs: IV fluids, monitor potassium  Anxieties, Fears or Concerns: None voiced  Patient-Specific Goals (Include Timeframe): To go home  Goal: Absence of Hospital-Acquired Illness or Injury  Outcome: Ongoing (see interventions/notes)  Intervention: Identify and Manage Fall Risk  Recent Flowsheet Documentation  Taken 02/17/2023 2040 by Jaynie Collins, RN  Safety Promotion/Fall Prevention:   activity supervised   fall prevention program maintained   nonskid shoes/slippers when out of bed   safety round/check completed  Intervention: Prevent Skin Injury  Recent Flowsheet Documentation  Taken 02/17/2023 2040 by Jaynie Collins, RN  Skin Protection:   adhesive use limited   incontinence pads utilized   tubing/devices free from skin contact  Intervention: Prevent and Manage VTE (Venous Thromboembolism) Risk  Recent Flowsheet Documentation  Taken 02/17/2023 2040 by Jaynie Collins, RN  VTE Prevention/Management:   anticoagulant therapy maintained   ambulation promoted  Goal: Optimal Comfort and Wellbeing  Outcome: Ongoing (see interventions/notes)  Goal: Rounds/Family Conference  Outcome: Ongoing (see interventions/notes)     Problem: Health Knowledge, Opportunity to Enhance (Adult,Obstetrics,Pediatric)  Goal: Knowledgeable about Health Subject/Topic  Description: Patient will demonstrate the desired outcomes by discharge/transition of care.  Outcome: Ongoing (see interventions/notes)     Problem: Fall Injury Risk  Goal: Absence of Fall and Fall-Related Injury  Outcome: Ongoing (see interventions/notes)  Intervention: Promote Injury-Free Environment  Recent Flowsheet Documentation  Taken 02/17/2023 2040 by Jaynie Collins, RN  Safety Promotion/Fall Prevention:   activity  supervised   fall prevention program maintained   nonskid shoes/slippers when out of bed   safety round/check completed     Problem: Muscle Strength Impairment  Goal: Improved Muscle Strength  Outcome: Ongoing (see interventions/notes)     Problem: Fluid Volume Deficit  Goal: Fluid Balance  Outcome: Ongoing (see interventions/notes)    Pt alert and oriented x4. No c/o CP. Vitals stable. Sr on tele. Call light in reach.  Jaynie Collins, RN

## 2023-02-18 NOTE — Nurses Notes (Signed)
Discharge instructions given, patient verbalized understanding. IV removed with cath intact, no bleeding noted. Eain Mullendore, RN

## 2023-02-23 ENCOUNTER — Other Ambulatory Visit (INDEPENDENT_AMBULATORY_CARE_PROVIDER_SITE_OTHER): Payer: Self-pay | Admitting: Family Medicine

## 2023-02-23 DIAGNOSIS — E1165 Type 2 diabetes mellitus with hyperglycemia: Secondary | ICD-10-CM

## 2023-02-23 DIAGNOSIS — E119 Type 2 diabetes mellitus without complications: Secondary | ICD-10-CM

## 2023-02-24 ENCOUNTER — Other Ambulatory Visit (INDEPENDENT_AMBULATORY_CARE_PROVIDER_SITE_OTHER): Payer: Self-pay | Admitting: Family Medicine

## 2023-02-24 ENCOUNTER — Other Ambulatory Visit: Payer: Self-pay | Admitting: *Deleted

## 2023-02-24 DIAGNOSIS — G8929 Other chronic pain: Secondary | ICD-10-CM

## 2023-02-24 DIAGNOSIS — E119 Type 2 diabetes mellitus without complications: Secondary | ICD-10-CM

## 2023-02-24 DIAGNOSIS — E1165 Type 2 diabetes mellitus with hyperglycemia: Secondary | ICD-10-CM

## 2023-02-24 NOTE — Telephone Encounter (Signed)
Tammy:  Please see below. Needs referral to this pain management. Chronic abdominal pain, history of pancreatitis, chronic hip/back pain.

## 2023-02-24 NOTE — Telephone Encounter (Signed)
Prescription needs to be sent to [facility] please.    Patient call stating she needs prescription for new glucose monitor.  States other quit working and she has been in contact with [employer]  Sherryle Lis

## 2023-02-24 NOTE — Telephone Encounter (Signed)
Last scheduled appointment with you was 12/31/2022.  Currently scheduled future appointment is 04/09/2023.    [place], [place]  02/24/2023, 08:06

## 2023-02-25 ENCOUNTER — Telehealth (INDEPENDENT_AMBULATORY_CARE_PROVIDER_SITE_OTHER): Payer: Self-pay | Admitting: Family Medicine

## 2023-02-25 DIAGNOSIS — E119 Type 2 diabetes mellitus without complications: Secondary | ICD-10-CM

## 2023-02-25 DIAGNOSIS — E1165 Type 2 diabetes mellitus with hyperglycemia: Secondary | ICD-10-CM

## 2023-02-25 MED ORDER — BLOOD-GLUCOSE METER
1 refills | Status: DC
Start: 2023-02-25 — End: 2023-02-25

## 2023-02-25 MED ORDER — BLOOD-GLUCOSE METER
1 refills | Status: AC
Start: 2023-02-25 — End: ?

## 2023-02-25 MED ORDER — ACCU-CHEK GUIDE TEST STRIPS
ORAL_STRIP | 3 refills | Status: DC
Start: 2023-02-25 — End: 2023-12-22

## 2023-02-25 MED ORDER — LANCETS
3 refills | Status: DC
Start: 2023-02-25 — End: 2024-05-12

## 2023-02-25 NOTE — Telephone Encounter (Signed)
Rick, from [facility], called stating they received pt's rx for a Accu Check Guide    He said they need rx's for test strips and lancets sent to them

## 2023-02-25 NOTE — Addendum Note (Signed)
Addended by: Wynona Meals on: 02/25/2023 03:11 PM     Modules accepted: Orders

## 2023-02-25 NOTE — Addendum Note (Signed)
Addended by: Rochele Pages on: 02/25/2023 01:56 PM     Modules accepted: Orders

## 2023-02-25 NOTE — Telephone Encounter (Signed)
Pharmacy correction       Estephani Popper, LPN

## 2023-02-26 NOTE — Telephone Encounter (Signed)
Pharmacy is calling to follow up on this. I let them know that provider is out of the office. Pharmacist questions if these can be phoned in.

## 2023-02-26 NOTE — Telephone Encounter (Signed)
Contacted pharmacy to advise to cancel the order received at their pharmacy.     Patient was wanting [facility] to fulfill this request and this nurse attached [facility] on accident.       Rochele Pages, LPN

## 2023-02-27 ENCOUNTER — Other Ambulatory Visit (INDEPENDENT_AMBULATORY_CARE_PROVIDER_SITE_OTHER): Payer: Self-pay | Admitting: Family Medicine

## 2023-02-27 DIAGNOSIS — R110 Nausea: Secondary | ICD-10-CM

## 2023-03-02 NOTE — Telephone Encounter (Signed)
Last scheduled appointment with you was 12/31/2022.  Currently scheduled future appointment is 04/09/2023.    [place], [place]  03/02/2023, 08:13

## 2023-03-04 ENCOUNTER — Encounter (HOSPITAL_BASED_OUTPATIENT_CLINIC_OR_DEPARTMENT_OTHER): Payer: Self-pay | Admitting: Family

## 2023-03-04 ENCOUNTER — Ambulatory Visit (INDEPENDENT_AMBULATORY_CARE_PROVIDER_SITE_OTHER): Payer: Commercial Managed Care - PPO | Admitting: Family

## 2023-03-04 ENCOUNTER — Other Ambulatory Visit: Payer: Self-pay

## 2023-03-04 VITALS — BP 114/70 | HR 98 | Ht 66.0 in | Wt 202.0 lb

## 2023-03-04 DIAGNOSIS — I6523 Occlusion and stenosis of bilateral carotid arteries: Secondary | ICD-10-CM

## 2023-03-04 DIAGNOSIS — Z951 Presence of aortocoronary bypass graft: Secondary | ICD-10-CM

## 2023-03-04 DIAGNOSIS — I2510 Atherosclerotic heart disease of native coronary artery without angina pectoris: Secondary | ICD-10-CM

## 2023-03-04 DIAGNOSIS — E785 Hyperlipidemia, unspecified: Secondary | ICD-10-CM

## 2023-03-04 DIAGNOSIS — I10 Essential (primary) hypertension: Secondary | ICD-10-CM

## 2023-03-04 DIAGNOSIS — R42 Dizziness and giddiness: Secondary | ICD-10-CM

## 2023-03-04 MED ORDER — EZETIMIBE 10 MG TABLET
10.0000 mg | ORAL_TABLET | Freq: Every evening | ORAL | 3 refills | Status: DC
Start: 2023-03-04 — End: 2024-03-06

## 2023-03-04 NOTE — Progress Notes (Signed)
[facility]  [address]  [place] [address]  [id number]    Date:   03/04/2023  Name: Monique [name]  Age: 57 y.o.    REASON FOR VISIT:   Hospital Follow Up     HISTORY OF PRESENTING ILLNESS:    Monique [name] is a 57 y.o. female with cardiac history significant for coronary artery disease status post CABG in 2012 followed by MI which required PCI to the LIMA to the LAD, left main and ostial circ.     Patient was recently evaluated in the hospital from 3/11-3/12 with complaints persistent chest pain.The patient reportedly has been seen at [facility] and was discharged on 02/12/2023 for concern of chest pain.  Workup there was benign and patient was discharged home.  Patient then returned to [facility] emergency department and noted to have metabolic acidosis and with the initiation of IV fluids her chest pain/pressure resolved.      The patient presents today for a follow-up appointment. She reports that she has been doing well. She reports that she has had no further episodes of chest discomfort. She reports that she does have some dizziness with position change. She also reports continued fatigue and general weakness. She reports left leg weakness and reports that she is scheduled to have an MRI in the near future.     Current Outpatient Medications   Medication Sig    aspirin (ECOTRIN) 81 mg Oral Tablet, Delayed Release (E.C.) Take 1 Tablet (81 mg total) by mouth Once a day    Blood Sugar Diagnostic (ACCU-CHEK GUIDE TEST STRIPS) Does not apply Strip TEST BLOOD SUGAR THREE TIMES DAILY    Blood-Glucose Meter (ACCU-CHEK GUIDE GLUCOSE METER) Misc USE AS DIRECTED    clopidogreL (PLAVIX) 75 mg Oral Tablet TAKE 1 TABLET EVERY DAY    cyanocobalamin (VITAMIN B12) 1,000 mcg/mL Injection Solution INJECT 1 ML (1,000 MCG TOTAL) UNDER THE SKIN EVERY 30 DAYS    diclofenac sodium (VOLTAREN) 1 % Gel Apply 2 g topically Three times a day as needed    docusate sodium  (COLACE) 100 mg Oral Capsule TAKE 1 CAPSULE TWICE DAILY    empagliflozin (JARDIANCE) 10 mg Oral Tablet Take 1 Tablet (10 mg total) by mouth Once a day    ergocalciferol, vitamin D2, (DRISDOL) 1,250 mcg (50,000 unit) Oral Capsule TAKE 1 CAPSULE BY MOUTH EVERY WEEK    ezetimibe (ZETIA) 10 mg Oral Tablet Take 1 Tablet (10 mg total) by mouth Every evening    fluticasone propionate (FLONASE) 50 mcg/actuation Nasal Spray, Suspension Administer 2 Sprays into each nostril Once a day    furosemide (LASIX) 40 mg Oral Tablet TAKE 1 TABLET EVERY DAY    HYDROcodone-acetaminophen (NORCO) 10-325 mg Oral Tablet Take 1 Tablet by mouth Every 8 hours as needed for Pain [name] with [facility]    Lancets (ACCU-CHEK SOFTCLIX LANCETS) Misc USE AS DIRECTED DAILY    levothyroxine (SYNTHROID) 25 mcg Oral Tablet TAKE 1 TABLET ONE TIME DAILY    metoprolol succinate (TOPROL-XL) 25 mg Oral Tablet Sustained Release 24 hr Take 0.5 Tablets (12.5 mg total) by mouth Once a day    NARCAN 4 mg/actuation Nasal Spray, Non-Aerosol PT STATED SHE HAS NARCAN AT HOME BUT IS NOT TAKING IT    Needle, Disp, 25 G 25 gauge x 1" Needle 1 mL Every 30 days    niacin (NIASPAN) 500 mg Oral Tablet Sustained Release 24 hr TAKE 1 TABLET EVERY DAY  nitroGLYCERIN (NITROSTAT) 0.4 mg Sublingual Tablet, Sublingual Place 1 Tablet (0.4 mg total) under the tongue Every 5 minutes as needed for Chest pain for 3 doses over 15 minutes    omeprazole (PRILOSEC) 40 mg Oral Capsule, Delayed Release(E.C.) TAKE 1 CAPSULE EVERY DAY    ondansetron (ZOFRAN ODT) 8 mg Oral Tablet, Rapid Dissolve DISSOLVE 1 TABLET ON THE TONGUE EVERY 8 HOURS AS NEEDED FOR NAUSEA AND VOMITING    OZEMPIC 2 mg/dose (8 mg/3 mL) Subcutaneous Pen Injector Inject 2 mg under the skin Every 7 days    pioglitazone (ACTOS) 15 mg Oral Tablet Take 1 Tablet (15 mg total) by mouth Once a day    PRENATAL PLUS, CALCIUM CARB, 27 mg iron- 1 mg Oral Tablet Take 1 Tablet by mouth Once a day    rosuvastatin  (CRESTOR) 40 mg Oral Tablet Take 1 Tablet (40 mg total) by mouth Once a day TAKE 1 TABLET BY MOUTH ONCE DAILY    sennosides-docusate sodium (SENOKOT-S) 8.6-50 mg Oral Tablet Take 1 Tablet by mouth Every evening (Patient not taking: Reported on 03/04/2023)    venlafaxine (EFFEXOR XR) 37.5 mg Oral Capsule, Sust. Release 24 hr TAKE 1 CAPSULE ONE TIME DAILY    ZYRTEC-D 5-120 mg Oral Tablet Sustained Release 12 hr Take 1 Tablet by mouth Once a day     Allergies   Allergen Reactions    Capron Dm [Pyrilamine-Dextromethorphan]  Other Adverse Reaction (Add comment)     LIGHT HEADED    Other      Coban dressing caused skin break down    Tylenol W Codeine [Acetaminophen-Codeine]  Other Adverse Reaction (Add comment)     pt states that one time she had chest pains with this med, but dr. thought it was because she had not eaten     Past Medical History:   Diagnosis Date    Anxiety     Arthritis     Back problem     DDD    Beta blocker prescribed for left ventricular systolic dysfunction     Blood thinned due to long-term anticoagulant use     Carotid stenosis     occlusion of left ICA    Chest pain 04/03/2018    Chronic back pain     Chronic obstructive pulmonary disease, unspecified COPD type (CMS [place]) 12/31/2022    Coronary artery disease involving native coronary artery of native heart     DDD (degenerative disc disease), lumbar     "entire spine"    Ear piercing     GERD (gastroesophageal reflux disease)     controlled with omeprazole    H/O complete eye exam     2 years, Dr. [name], at [facility]    History of dental examination     dentures upper and lower    HTN     Hx of coronary artery bypass graft     2012    Hx of gastric bypass     Hypercholesterolemia     Hyperlipidemia     Hypothyroid     MI (myocardial infarction) (CMS [place]) 2012    Neck problem     herniated cervical disc    Obesity (BMI 30-39.9)     Peripheral vascular disease, unspecified (CMS [place]) 01/03/2021    Solitary nodule of right lobe of thyroid 03/30/2018     Incidental finding on MRI thoracic spine. [place] ordered.     Stented coronary artery     x4    Stroke (  CMS [place])     2002    Tattoo     Left breast, Right shoulder    Thyroid disorder     Thyroid nodule     Type 2 diabetes mellitus (CMS [facility])     HGA1C 10.4 on 12/19/21    Uncontrolled type 2 diabetes mellitus, without long-term current use of insulin     Xanthelasma          Past Surgical History:   Procedure Laterality Date    ENDOMETRIAL ABLATION  1998    HX ANKLE FRACTURE [place]    Right, Doran Durand procedure    HX CATARACT REMOVAL Bilateral 2013    r and l eyes with implants    HX CESAREAN SECTION  06/20/2000    x3, 11/21/86, 11/10/84    HX CHOLECYSTECTOMY  1988    HX CORONARY ARTERY BYPASS GRAFT  03/31/2011    cardiac, 2 vessel, Dr. [name], [facility]    HX CORONARY STENT PLACEMENT  2012    x4 stents    HX GASTRIC SLEEVE  02/2017    [place], [place], Dr. [name] HEART CATHETERIZATION  2012    massive heart attack and stents - [facility]    HX THYROID BIOPSY      HX THYROIDECTOMY  101/01/2018    HX TONSILLECTOMY      as child    HX YAG Left 09/20/2013         Family Medical History:       Problem Relation (Age of Onset)    Atrial fibrillation Sister    Colon Cancer Father    Diabetes Mother, Maternal Aunt    Heart Disease Sister, Maternal Grandfather    Hypertension (High Blood Pressure) Mother    Lung Cancer Maternal Grandfather    MI <39 years of age Mother            Social History     Socioeconomic History    Marital status: Married     Spouse name: [name]    Number of children: 3    Years of education: completed 11th grade   Occupational History    Occupation: disabled   Tobacco Use    Smoking status: Every Day     Current packs/day: 0.50     Average packs/day: 0.5 packs/day for 43.2 years (21.6 ttl pk-yrs)     Types: Cigarettes     Start date: 63    Smokeless tobacco: Never   Vaping Use    Vaping status: Never Used   Substance and Sexual Activity    Alcohol use: No    Drug use: No    Sexual activity: Yes      Partners: Male   Other Topics Concern    Ability to Walk 1 Flight of Steps without SOB/CP Yes    Routine Exercise No    Ability to Walk 2 Flight of Steps without SOB/CP Yes    Ability To Do Own ADL's Yes    Uses Walker No    Other Activity Level Yes     Comment: on the go all the time - does cleaning, grocery shopping    Uses Cane No   Social History Narrative    Living situation: lives at home with husband, [name], and daughter.  1 dog named Duke and 1 cat named Lucky.    Nutrition:  Eats 5 small meals d/t gastric sleeve surgery.     Caffeine use: none, decaf coffee  Exercise: stays busy around house. No formal form of exercise    Seatbelt use: sometimes    Fire extinguishers in the home: yes    Smoke alarms in the home: yes    Carbon monoxide detectors in the home: yes    Nancy Fetter exposure: sunglasses        [place] would like for husband, U[place]ia Jenson,  to speak for her in the event she would become incapacitated.    Full code.      Social Determinants of Health     Social Connections: Medium Risk (02/17/2023)    Social Connections     SDOH Social Isolation: 3 to 5 times a week       REVIEW OF SYSTEMS:    Review of Systems   Constitutional: Positive for malaise/fatigue. Negative for diaphoresis, fever, night sweats and weight loss.   Cardiovascular:  Negative for chest pain, dyspnea on exertion, irregular heartbeat, leg swelling, near-syncope, orthopnea, palpitations, paroxysmal nocturnal dyspnea and syncope.   Respiratory:  Negative for shortness of breath.    Hematologic/Lymphatic: Negative for bleeding problem. Does not bruise/bleed easily.   Gastrointestinal:  Negative for hematemesis, hematochezia and melena.   Genitourinary:  Negative for hematuria.   Neurological:  Positive for dizziness and weakness. Negative for light-headedness.          PHYSICAL EXAM:  Vitals reviewed. BP 114/70 Comment: RTA  Pulse 98   Ht 1.676 m (5\' 6" )   Wt 91.6 kg (202 lb)   LMP  (LMP Unknown)   SpO2 93%   BMI 32.60 kg/m        Vitals Filed         03/04/2023  0958 03/04/2023  0959          BP: 108/66 114/70      Pulse: 98 --              Physical Exam  Vitals reviewed.   Constitutional:       General: She is not in acute distress.     Appearance: Normal appearance. She is not ill-appearing.   HENT:      Head: Normocephalic and atraumatic.   Neck:      Vascular: No carotid bruit or JVD.   Cardiovascular:      Rate and Rhythm: Normal rate and regular rhythm.      Heart sounds: Normal heart sounds. No murmur heard.     No friction rub. No gallop.   Pulmonary:      Effort: Pulmonary effort is normal. No accessory muscle usage or respiratory distress.      Breath sounds: Normal breath sounds. No wheezing, rhonchi or rales.   Chest:      Chest wall: No tenderness.   Musculoskeletal:      Cervical back: Neck supple.      Right lower leg: No edema.      Left lower leg: No edema.   Skin:     General: Skin is warm and dry.      Coloration: Skin is not jaundiced.   Neurological:      General: No focal deficit present.      Mental Status: She is alert and oriented to person, place, and time.      Cranial Nerves: No cranial nerve deficit.      Coordination: Coordination normal.      Gait: Gait normal.   Psychiatric:         Attention and Perception: Attention normal.  Mood and Affect: Mood normal.         Speech: Speech normal.         Behavior: Behavior normal. Behavior is cooperative.         Thought Content: Thought content normal.          BASIC METABOLIC PANEL  Lab Results   Component Value Date    SODIUM 138 02/18/2023    POTASSIUM 3.6 02/18/2023    CHLORIDE 110 02/18/2023    CO2 19 (L) 02/18/2023    ANIONGAP 9 02/18/2023    BUN 11 02/18/2023    CREATININE 0.83 02/18/2023    BUNCRRATIO 13 02/18/2023    GFR 82 02/18/2023    CALCIUM 8.4 (L) 02/18/2023    GLUCOSENF 177 (H) 05/08/2021          MAGNESIUM  Lab Results   Component Value Date    MAGNESIUM 1.8 02/16/2023         Lab Results   Component Value Date    CHOLESTEROL 272 (H)  01/30/2023    HDLCHOL 44 (L) 01/30/2023    LDLCHOL 206 (H) 01/30/2023    TRIG 121 01/30/2023        HEMOGLOBIN A1C   Date Value Ref Range Status   01/30/2023 10.9 (H) <5.7 % Final     Comment:     Hemoglobin A1c Ranges  Non-Diabetic:  <5.7  Increased Risk (pre-diabetic) for impaired glucose tolerance:  5.7 - 6.4  Consistent with diabetes (in not previously established diabetic patient:  > or = 6.5       POCT HGB A1C   Date Value Ref Range Status   07/04/2020 9.9 (A) 4 - 6 % Final       ASSESSMENT:  ENCOUNTER DIAGNOSES     ICD-10-CM   1. Hyperlipidemia, unspecified hyperlipidemia type  E78.5   2. Coronary artery disease, unspecified vessel or lesion type, unspecified whether angina present, unspecified whether native or transplanted heart  I25.10   3. Hx of CABG  Z95.1   4. Hypertension, unspecified type  I10   5. Bilateral carotid artery stenosis  I65.23   6. Dizziness  R42       PLAN:    Coronary artery disease:  History of two-vessel bypass in 2012 and PCI. The patient denies anginal symptoms.  I instructed the patient to call or go to the ED for chest pain, shortness of breath, or palpitations.  Patient recently evaluated at [facility] which she had a stress test and echocardiogram.  stress test revealed no convincing evidence of ischemia or infarction.  Echocardiogram revealed normal EF of 123456, grade 1 diastolic dysfunction, mild MR/TR.  Continue aggressive risk factor modifications and current medical therapy.     Hypertension-she was normotensive on today's exam.  She was advised to metoprolol for goal blood pressure less than 130/80.    Hyperlipidemia-goal LDL less than 70.  Most recent LDL 206.  At this time we will start Zetia 10 mg daily.  Patient was advised to continue rosuvastatin and risk factor modifications.  We will repeat lipid panel in 3-6 months.    Dizziness carotid artery stenosis-patient reports dizziness with position change.  Most recent carotid ultrasound revealed left  ICA with known occlusion and right ICA less than 50% stenosis.  Patient does follow with vascular.  Patient was advised to change positions slowly and wear compression stockings.    Orders Placed This Encounter    ezetimibe (ZETIA) 10 mg Oral Tablet  Return in about 6 months (around 09/04/2023) for [employer].  for continue management of chronic health issues including hypertension, hyperlipidemia, coronary artery disease    Plan was discussed and patient verbalized understanding. All questions were answered to the patient's satisfaction. Advised patient to call with any changes in symptoms.    Electronically signed by   Jake Shark, NP 03/04/23 16:16     I have reviewed the above history  and agree with the assessment and plan outlined in the nurse practitioner's note.    Paulita Cradle, MD        This note may have been partially generated using MModal Fluency Direct system, and there may be some incorrect words, spellings, and punctuation that were not noted in checking the note before saving, though effort was made to avoid such errors.

## 2023-03-04 NOTE — Patient Instructions (Signed)
Continue same medications    Goal BP 100/60 to 130/80    Goal heart rate 60 to 100    Goal LDL- < 70, current LDL 206    Call or go to the ED for chest pain, shortness of breath, or palpitations     Start Zetia 10 mg daily

## 2023-03-05 ENCOUNTER — Other Ambulatory Visit (INDEPENDENT_AMBULATORY_CARE_PROVIDER_SITE_OTHER): Payer: Self-pay | Admitting: Family Medicine

## 2023-03-05 NOTE — Telephone Encounter (Signed)
Patient requesting refill of ibuprofen 800mg  be sent to [place].    Monique [name]

## 2023-03-06 NOTE — Telephone Encounter (Signed)
Last scheduled appointment with you was 12/31/2022.  Currently scheduled future appointment is 04/09/2023.      Rochele Pages, LPN  [id number], [id number]        Patient was previously prescribed this medication.   D/c reason was "other"   Patient reports she only takes as needed, so she hasn't needed a refill for awhile.

## 2023-03-07 MED ORDER — IBUPROFEN 800 MG TABLET
800.00 mg | ORAL_TABLET | Freq: Three times a day (TID) | ORAL | 1 refills | Status: AC | PRN
Start: 2023-03-07 — End: ?

## 2023-03-23 NOTE — Telephone Encounter (Signed)
Ann [name]/Ann [name]:  Please see info below. She needs referral to [facility]  [address] [place] [phone] or [phone]

## 2023-03-23 NOTE — Telephone Encounter (Signed)
noted 

## 2023-03-24 ENCOUNTER — Encounter (HOSPITAL_COMMUNITY): Payer: Self-pay

## 2023-03-24 ENCOUNTER — Emergency Department (HOSPITAL_COMMUNITY): Payer: Commercial Managed Care - PPO

## 2023-03-24 ENCOUNTER — Emergency Department
Admission: EM | Admit: 2023-03-24 | Discharge: 2023-03-24 | Disposition: A | Discharge status: Home / Self Care | Payer: Commercial Managed Care - PPO | Attending: Emergency Medicine | Admitting: Emergency Medicine

## 2023-03-24 ENCOUNTER — Other Ambulatory Visit: Payer: Self-pay

## 2023-03-24 DIAGNOSIS — Z951 Presence of aortocoronary bypass graft: Secondary | ICD-10-CM | POA: Insufficient documentation

## 2023-03-24 DIAGNOSIS — R937 Abnormal findings on diagnostic imaging of other parts of musculoskeletal system: Secondary | ICD-10-CM

## 2023-03-24 DIAGNOSIS — Z8673 Personal history of transient ischemic attack (TIA), and cerebral infarction without residual deficits: Secondary | ICD-10-CM

## 2023-03-24 DIAGNOSIS — G319 Degenerative disease of nervous system, unspecified: Secondary | ICD-10-CM | POA: Insufficient documentation

## 2023-03-24 DIAGNOSIS — I6523 Occlusion and stenosis of bilateral carotid arteries: Secondary | ICD-10-CM | POA: Insufficient documentation

## 2023-03-24 DIAGNOSIS — M542 Cervicalgia: Secondary | ICD-10-CM

## 2023-03-24 DIAGNOSIS — Z7902 Long term (current) use of antithrombotics/antiplatelets: Secondary | ICD-10-CM | POA: Insufficient documentation

## 2023-03-24 DIAGNOSIS — E1151 Type 2 diabetes mellitus with diabetic peripheral angiopathy without gangrene: Secondary | ICD-10-CM | POA: Insufficient documentation

## 2023-03-24 DIAGNOSIS — I10 Essential (primary) hypertension: Secondary | ICD-10-CM | POA: Insufficient documentation

## 2023-03-24 DIAGNOSIS — G8929 Other chronic pain: Secondary | ICD-10-CM | POA: Insufficient documentation

## 2023-03-24 DIAGNOSIS — J449 Chronic obstructive pulmonary disease, unspecified: Secondary | ICD-10-CM | POA: Insufficient documentation

## 2023-03-24 DIAGNOSIS — Z7982 Long term (current) use of aspirin: Secondary | ICD-10-CM | POA: Insufficient documentation

## 2023-03-24 DIAGNOSIS — F17210 Nicotine dependence, cigarettes, uncomplicated: Secondary | ICD-10-CM | POA: Insufficient documentation

## 2023-03-24 LAB — CBC WITH DIFF
BASOPHIL #: <0.1 10*3/uL (ref <=0.20)
BASOPHIL %: 1 %
EOSINOPHIL #: 0.21 10*3/uL (ref <=0.50)
EOSINOPHIL %: 3 %
HCT: 37.5 % (ref 34.8–46.0)
HGB: 12.5 g/dL (ref 11.5–16.0)
IMMATURE GRANULOCYTE #: <0.1 10*3/uL (ref <0.10)
IMMATURE GRANULOCYTE %: 0 % (ref 0.0–1.0)
LYMPHOCYTE #: 3.32 10*3/uL (ref 1.00–4.80)
LYMPHOCYTE %: 41 %
MCH: 30.1 pg (ref 26.0–32.0)
MCHC: 33.3 g/dL (ref 31.0–35.5)
MCV: 90.4 fL (ref 78.0–100.0)
MONOCYTE #: 0.78 10*3/uL (ref 0.20–1.10)
MONOCYTE %: 10 %
MPV: 8.9 fL (ref 8.7–12.5)
NEUTROPHIL #: 3.66 10*3/uL (ref 1.50–7.70)
NEUTROPHIL %: 45 %
PLATELETS: 329 10*3/uL (ref 150–400)
RBC: 4.15 10*6/uL (ref 3.85–5.22)
RDW-CV: 16.2 % — ABNORMAL HIGH (ref 11.5–15.5)
WBC: 8 10*3/uL (ref 3.7–11.0)

## 2023-03-24 LAB — HEPATIC FUNCTION PANEL
ALBUMIN: 2.9 g/dL — ABNORMAL LOW (ref 3.5–5.0)
ALKALINE PHOSPHATASE: 86 U/L (ref 50–130)
ALT (SGPT): 16 U/L (ref 8–22)
AST (SGOT): 25 U/L (ref 8–45)
BILIRUBIN DIRECT: 0.1 mg/dL (ref 0.1–0.4)
BILIRUBIN TOTAL: 0.2 mg/dL — ABNORMAL LOW (ref 0.3–1.3)
PROTEIN TOTAL: 5.7 g/dL — ABNORMAL LOW (ref 6.4–8.3)

## 2023-03-24 LAB — PTT (PARTIAL THROMBOPLASTIN TIME): APTT: 28.7 seconds (ref 26.0–39.0)

## 2023-03-24 LAB — BASIC METABOLIC PANEL
ANION GAP: 8 mmol/L (ref 4–13)
BUN/CREA RATIO: 12 (ref 6–22)
BUN: 10 mg/dL (ref 8–25)
CALCIUM: 8.5 mg/dL — ABNORMAL LOW (ref 8.6–10.2)
CHLORIDE: 111 mmol/L (ref 96–111)
CO2 TOTAL: 22 mmol/L (ref 22–30)
CREATININE: 0.82 mg/dL (ref 0.60–1.05)
ESTIMATED GFR - FEMALE: 83 mL/min/BSA (ref >=60)
GLUCOSE: 165 mg/dL — ABNORMAL HIGH (ref 65–125)
POTASSIUM: 4 mmol/L (ref 3.5–5.1)
SODIUM: 141 mmol/L (ref 136–145)

## 2023-03-24 LAB — POC ISTAT CREATININE (RESULT): CREATININE, POC: 0.9 mg/dL (ref 0.50–1.20)

## 2023-03-24 LAB — TROPONIN-I
TROPONIN-I HS: <2.7 ng/L (ref <=14.0)
TROPONIN-I HS: <2.7 ng/L (ref <=14.0)

## 2023-03-24 LAB — PT/INR
INR: 0.84 (ref <=5.00)
PROTHROMBIN TIME: 10 seconds (ref 9.7–13.6)

## 2023-03-24 LAB — LACTIC ACID LEVEL W/ REFLEX FOR LEVEL >2.0: LACTIC ACID: 0.8 mmol/L (ref 0.5–2.2)

## 2023-03-24 MED ORDER — CAPSAICIN 0.025 % TOPICAL CREAM
TOPICAL_CREAM | Freq: Four times a day (QID) | CUTANEOUS | 0 refills | Status: DC | PRN
Start: 2023-03-24 — End: 2023-05-03

## 2023-03-24 MED ORDER — LIDOCAINE 4 % TOPICAL PATCH
1.0000 | MEDICATED_PATCH | Freq: Every day | CUTANEOUS | 0 refills | Status: DC
Start: 2023-03-24 — End: 2023-04-09

## 2023-03-24 MED ORDER — MORPHINE 2 MG/ML INTRAVENOUS SYRINGE
2.0000 mg | INJECTION | INTRAVENOUS | Status: AC
Start: 2023-03-24 — End: 2023-03-24
  Administered 2023-03-24: 2 mg via INTRAVENOUS
  Filled 2023-03-24: qty 1

## 2023-03-24 MED ORDER — SODIUM CHLORIDE 0.9 % (FLUSH) INJECTION SYRINGE
3.0000 mL | INJECTION | INTRAMUSCULAR | Status: DC | PRN
Start: 2023-03-24 — End: 2023-03-24

## 2023-03-24 MED ORDER — CAPSAICIN 0.075 % TOPICAL CREAM
TOPICAL_CREAM | CUTANEOUS | Status: AC
Start: 2023-03-24 — End: 2023-03-24
  Filled 2023-03-24: qty 57

## 2023-03-24 MED ORDER — SODIUM CHLORIDE 0.9 % (FLUSH) INJECTION SYRINGE
3.0000 mL | INJECTION | Freq: Three times a day (TID) | INTRAMUSCULAR | Status: DC
Start: 2023-03-24 — End: 2023-03-24

## 2023-03-24 MED ORDER — LIDOCAINE 3.6 %-MENTHOL 1.25 % TOPICAL PATCH
1.0000 | MEDICATED_PATCH | Freq: Every day | CUTANEOUS | Status: DC
Start: 2023-03-24 — End: 2023-03-24
  Administered 2023-03-24: 1 via TRANSDERMAL
  Filled 2023-03-24: qty 1

## 2023-03-24 MED ORDER — IOPAMIDOL 370 MG IODINE/ML (76 %) INTRAVENOUS SOLUTION
80.0000 mL | INTRAVENOUS | Status: AC
Start: 2023-03-24 — End: 2023-03-24
  Administered 2023-03-24: 80 mL via INTRAVENOUS

## 2023-03-24 MED ORDER — SODIUM CHLORIDE 0.9 % IV BOLUS
500.0000 mL | INJECTION | Status: AC
Start: 2023-03-24 — End: 2023-03-24
  Administered 2023-03-24: 500 mL via INTRAVENOUS

## 2023-03-24 NOTE — ED Provider Notes (Signed)
[facility]- Emergency Department      CC:  Chief Complaint   Patient presents with    Neck Pain     HPI:  Monique [name] is a 57 y.o. female who presents to the ED via POV with c/o left-sided neck pain.  Patient states "I've done something to my neck. .  This is not the first time." Patient states she is having stabbing pain over her left-sided neck that has been intermittent "for 4 or 5 months. . But I just decided I've had enough" so she came to the ER today.  Patient states the pain is in the left-sided neck and spreads to the top and left-sided head.  Patient states she has a history of chronic back pain as well as degenerative disc disease and arthritis for which she is prescribed hydrocodone t.i.d. for pain.  She states she took a dose of her pain medication today which resulted in resolution of the headache.  Patient states that her current symptoms began at 12:30 p.m. today.  Patient denies any recent neck trauma or injury.  She denies any recent chiropractic manipulations of the neck in the past few months.  She denies any redness or swelling to the neck.  Denies fevers.  She denies any vertigo or dizziness at rest.  She states occasionally she does feel lightheaded when standing.  She denies any chest pain, shortness of breath, or numbness and tingling.  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  She states she was told by her physician that she is not a candidate for surgical intervention due to her medical problems, and she is taking aspirin and Plavix.  She has a history of hyperlipidemia, CABG x2, chronic back pain, carotid stenosis, hypertension, type 2 diabetes, and COPD.    Review of Systems:  Constitutional: No fever/chills   Skin: No rash  HENT: No head injury.   Eyes: No vision changes.   Cardiovascular: No chest pain.   Respiratory: No SOB.   GI/abdomen: No abdominal pain. No diarrhea. No vomiting.  GU: No dysuria.    MSK/Extremities: No deformity  Neuro: No confusion. + headache, neck pain  Psych: No SI. No HI.   All other systems reviewed and are negative, unless commented on in the HPI.        History:  PMH:    Past Medical History:   Diagnosis Date    Anxiety     Arthritis     Back problem     DDD    Beta blocker prescribed for left ventricular systolic dysfunction     Blood thinned due to long-term anticoagulant use     Carotid stenosis     occlusion of left ICA    Chest pain 04/03/2018    Chronic back pain     Chronic obstructive pulmonary disease, unspecified COPD type (CMS HCC) 12/31/2022    Coronary artery disease involving native coronary artery of native heart     DDD (degenerative disc disease), lumbar     "entire spine"    Ear piercing     GERD (gastroesophageal reflux disease)     controlled with omeprazole    H/O complete eye exam     2 years, Dr. Thompson Caul, at [facility]    History of dental examination     dentures upper and lower    HTN     Hx of coronary artery bypass graft  2012    Hx of gastric bypass     Hypercholesterolemia     Hyperlipidemia     Hypothyroid     MI (myocardial infarction) (CMS HCC) 2012    Neck problem     herniated cervical disc    Obesity (BMI 30-39.9)     Peripheral vascular disease, unspecified (CMS HCC) 01/03/2021    Solitary nodule of right lobe of thyroid 03/30/2018    Incidental finding on MRI thoracic spine. [place] ordered.     Stented coronary artery     x4    Stroke (CMS [facility])     2002    Tattoo     Left breast, Right shoulder    Thyroid disorder     Thyroid nodule     Type 2 diabetes mellitus (CMS HCC)     HGA1C 10.4 on 12/19/21    Uncontrolled type 2 diabetes mellitus, without long-term current use of insulin     Xanthelasma          Discharge Medication List as of 03/24/2023  7:47 PM        CONTINUE these medications which have NOT CHANGED    Details   aspirin (ECOTRIN) 81 mg Oral Tablet, Delayed Release (E.C.) Take 1 Tablet (81 mg total) by mouth Once a day, Historical Med      Blood  Sugar Diagnostic (ACCU-CHEK GUIDE TEST STRIPS) Does not apply Strip TEST BLOOD SUGAR THREE TIMES DAILY, Disp-200 Each, R-3, E-Rx      Blood-Glucose Meter (ACCU-CHEK GUIDE GLUCOSE METER) Misc USE AS DIRECTED, Disp-1 Each, R-1, E-Rx      clopidogreL (PLAVIX) 75 mg Oral Tablet TAKE 1 TABLET EVERY DAY, Disp-90 Tablet, R-10, E-Rx      cyanocobalamin (VITAMIN B12) 1,000 mcg/mL Injection Solution INJECT 1 ML (1,000 MCG TOTAL) UNDER THE SKIN EVERY 30 DAYS, Disp-3 mL, R-10, E-Rx      diclofenac sodium (VOLTAREN) 1 % Gel Apply 2 g topically Three times a day as needed  Disp-1 Each, R-3  E-Rx      docusate sodium (COLACE) 100 mg Oral Capsule TAKE 1 CAPSULE TWICE DAILY, Disp-180 Capsule, R-2, E-Rx      empagliflozin (JARDIANCE) 10 mg Oral Tablet Take 1 Tablet (10 mg total) by mouth Once a day, Historical Med      ergocalciferol, vitamin D2, (DRISDOL) 1,250 mcg (50,000 unit) Oral Capsule TAKE 1 CAPSULE BY MOUTH EVERY WEEK, Disp-12 Capsule, R-10, E-Rx      ezetimibe (ZETIA) 10 mg Oral Tablet Take 1 Tablet (10 mg total) by mouth Every evening, Disp-90 Tablet, R-3, E-Rx      fluticasone propionate (FLONASE) 50 mcg/actuation Nasal Spray, Suspension Administer 2 Sprays into each nostril Once a day, Disp-16 g, R-0, E-Rx      furosemide (LASIX) 40 mg Oral Tablet TAKE 1 TABLET EVERY DAY, Disp-90 Tablet, R-3, E-Rx      HYDROcodone-acetaminophen (NORCO) 10-325 mg Oral Tablet Take 1 Tablet by mouth Every 8 hours as needed for Pain Reuben Likes with [facility], Historical Med      Ibuprofen (MOTRIN) 800 mg Oral Tablet Take 1 Tablet (800 mg total) by mouth Three times a day as needed for Pain, Disp-90 Tablet, R-1, E-Rx      Lancets (ACCU-CHEK SOFTCLIX LANCETS) Misc USE AS DIRECTED DAILY, Disp-300 Each, R-3, E-Rx      levothyroxine (SYNTHROID) 25 mcg Oral Tablet TAKE 1 TABLET ONE TIME DAILY, Disp-90 Tablet, R-10, E-Rx      metoprolol succinate (TOPROL-XL)  25 mg Oral Tablet Sustained Release 24 hr Take 0.5 Tablets (12.5 mg  total) by mouth Once a day, Disp-45 Tablet, R-3, E-Rx      NARCAN 4 mg/actuation Nasal Spray, Non-Aerosol PT STATED SHE HAS NARCAN AT HOME BUT IS NOT TAKING IT, DAW, Historical Med      Needle, Disp, 25 G 25 gauge x 1" Needle 1 mL Every 30 days, Disp-100 Each, R-0, E-Rx      niacin (NIASPAN) 500 mg Oral Tablet Sustained Release 24 hr TAKE 1 TABLET EVERY DAY, Disp-90 Tablet, R-3, E-Rx      nitroGLYCERIN (NITROSTAT) 0.4 mg Sublingual Tablet, Sublingual Place 1 Tablet (0.4 mg total) under the tongue Every 5 minutes as needed for Chest pain for 3 doses over 15 minutes, Disp-25 Tablet, R-3, E-Rx      omeprazole (PRILOSEC) 40 mg Oral Capsule, Delayed Release(E.C.) TAKE 1 CAPSULE EVERY DAY, Disp-90 Capsule, R-10, E-Rx      ondansetron (ZOFRAN ODT) 8 mg Oral Tablet, Rapid Dissolve DISSOLVE 1 TABLET ON THE TONGUE EVERY 8 HOURS AS NEEDED FOR NAUSEA AND VOMITING, Disp-90 Tablet, R-11, E-Rx      OZEMPIC 2 mg/dose (8 mg/3 mL) Subcutaneous Pen Injector Inject 2 mg under the skin Every 7 days, DAW, Historical Med      pioglitazone (ACTOS) 15 mg Oral Tablet Take 1 Tablet (15 mg total) by mouth Once a day, Historical Med      PRENATAL PLUS, CALCIUM CARB, 27 mg iron- 1 mg Oral Tablet Take 1 Tablet by mouth Once a day, Disp-90 Tablet, R-3, DAW, E-Rx      rosuvastatin (CRESTOR) 40 mg Oral Tablet Take 1 Tablet (40 mg total) by mouth Once a day TAKE 1 TABLET BY MOUTH ONCE DAILY, Disp-90 Tablet, R-2, E-Rx      sennosides-docusate sodium (SENOKOT-S) 8.6-50 mg Oral Tablet Take 1 Tablet by mouth Every evening, Disp-30 Tablet, R-0, E-Rx      venlafaxine (EFFEXOR XR) 37.5 mg Oral Capsule, Sust. Release 24 hr TAKE 1 CAPSULE ONE TIME DAILY, Disp-90 Capsule, R-10, E-Rx      ZYRTEC-D 5-120 mg Oral Tablet Sustained Release 12 hr Take 1 Tablet by mouth Once a day, Disp-90 Tablet, R-3, DAW, E-Rx             PSH:    Past Surgical History:   Procedure Laterality Date    ENDOMETRIAL ABLATION  1998    HX ANKLE FRACTURE TX  1990s    Right, [name]  procedure    HX CATARACT REMOVAL Bilateral 2013    r and l eyes with implants    HX CESAREAN SECTION  06/20/2000    x3, 11/21/86, 11/10/84    HX CHOLECYSTECTOMY  1988    HX CORONARY ARTERY BYPASS GRAFT  03/31/2011    cardiac, 2 vessel, Dr. Francesco Runner, [facility]    HX CORONARY STENT PLACEMENT  2012    x4 stents    HX GASTRIC SLEEVE  02/2017    [place], [place], Dr. Devin Going HEART CATHETERIZATION  2012    massive heart attack and stents - [facility]    HX THYROID BIOPSY      HX THYROIDECTOMY  101/01/2018    HX TONSILLECTOMY      as child    HX YAG Left 09/20/2013       . See HPI.  Social Hx:    Social History     Socioeconomic History    Marital status: Married     Spouse  name: Harvie Heck    Number of children: 3    Years of education: completed 11th grade    Highest education level: Not on file   Occupational History    Occupation: disabled   Tobacco Use    Smoking status: Every Day     Current packs/day: 0.50     Average packs/day: 0.5 packs/day for 43.3 years (21.6 ttl pk-yrs)     Types: Cigarettes     Start date: 77    Smokeless tobacco: Never   Vaping Use    Vaping status: Never Used   Substance and Sexual Activity    Alcohol use: No    Drug use: No    Sexual activity: Yes     Partners: Male   Other Topics Concern    Abuse/Domestic Violence Not Asked    Breast Self Exam Not Asked    Caffeine Concern Not Asked    Calcium intake adequate Not Asked    Computer Use Not Asked    Drives Not Asked    Exercise Concern Not Asked    Helmet Use Not Asked    Seat Belt Not Asked    Special Diet Not Asked    Sunscreen used Not Asked    Uses Cane Not Asked    Uses walker Not Asked    Uses wheelchair Not Asked    Right hand dominant Not Asked    Left hand dominant Not Asked    Ambidextrous Not Asked    Shift Work Not Asked    Unusual Sleep-Wake Schedule Not Asked    Ability to Walk 1 Flight of Steps without SOB/CP Yes    Routine Exercise No    Ability to Walk 2 Flight of Steps without SOB/CP Yes    Unable to Ambulate Not Asked    Total  Care Not Asked    Ability To Do Own ADL's Yes    Uses Walker No    Other Activity Level Yes     Comment: on the go all the time - does cleaning, grocery shopping    Uses Cane No   Social History Narrative    Living situation: lives at home with husband, Harvie Heck, and daughter.  1 dog named Duke and 1 cat named Lucky.    Nutrition:  Eats 5 small meals d/t gastric sleeve surgery.     Caffeine use: none, decaf coffee    Exercise: stays busy around house. No formal form of exercise    Seatbelt use: sometimes    Fire extinguishers in the home: yes    Smoke alarms in the home: yes    Carbon monoxide detectors in the home: yes    Wynelle Link exposure: sunglasses        Shauntay would like for husband, Josepha Barbier,  to speak for her in the event she would become incapacitated.    Full code.      Social Determinants of Health     Financial Resource Strain: Not on file   Transportation Needs: Not on file   Social Connections: Medium Risk (02/17/2023)    Social Connections     SDOH Social Isolation: 3 to 5 times a week   Intimate Partner Violence: Not on file   Housing Stability: Not on file     Family Hx:   Family History   Problem Relation Age of Onset    Diabetes Mother     Hypertension (High Blood Pressure) Mother     MI <55 years of  age Mother     Colon Cancer Father     Heart Disease Sister     Atrial fibrillation Sister     Heart Disease Maternal Grandfather     Lung Cancer Maternal Grandfather     Diabetes Maternal Aunt      Allergies:   Allergies   Allergen Reactions    Capron Dm [Pyrilamine-Dextromethorphan]  Other Adverse Reaction (Add comment)     LIGHT HEADED    Other      Coban dressing caused skin break down    Tylenol W Codeine [Acetaminophen-Codeine]  Other Adverse Reaction (Add comment)     pt states that one time she had chest pains with this med, but dr. thought it was because she had not eaten       Above history reviewed with patient, changes are as documented.    Physical Exam:   Nursing note and vitals reviewed.  ED  Triage Vitals [03/24/23 1728]   BP (Non-Invasive) 114/63   Heart Rate 73   Respiratory Rate 16   Temperature 36.8 C (98.2 F)   SpO2 96 %   Weight 90.7 kg (200 lb)   Height 1.676 m (5\' 6" )       Physical Exam  Nursing note and vitals reviewed.  Vital signs reviewed as above.   Constitutional: Pt is awake and alert.  Pleasant and cooperative, resting in bed in no acute distress.  Skin: no obvious rash, warm and well-perfused.  No obvious erythema, lesion, or warmth over the left lateral neck  HENT: Head is atraumatic. Moist mucous membranes.   Eyes: Conjunctivae clear  Cardiovascular: Regular rate. Normal rhythm.   Respiratory:  No respiratory distress.  No conversational dyspnea  GI/abdomen: No distension  MSK/Extremities:  No deformity.  Intact left radial pulse.  No tenderness to palpation throughout the left upper extremity.  Full, painless range of motion of the left shoulder.  Intact and equal grip strength bilaterally.  Sensation grossly intact left upper extremity.  Neck:  Full range of motion of the neck but pain in the left lateral neck when looking to the left and right.  No abnormal torticollis. + tenderness to palpation over the left lateral neck muscles into the shoulder and left upper back.  Neurological: Patient is alert and oriented, randomly moving extremities, speech fluent.   Psychiatric:  Mood and affect appropriate for situation        Course:   Impression: Pt presenting c/o   Chief Complaint   Patient presents with    Neck Pain       Plan: obtained the following labs/imaging and gave patient the following medications to alleviate symptoms:  Orders Placed This Encounter    CANCELED: CT BRAIN WO IV CONTRAST    CTA STROKE PROTOCOL (CTA BRAIN/CTA CAROTIDS W IV CONTRAST)    CT BRAIN WO IV CONTRAST - POSSIBLE STROKE    BASIC METABOLIC PANEL    CBC/DIFF    HEPATIC FUNCTION PANEL    LACTIC ACID LEVEL W/ REFLEX FOR LEVEL >2.0    PT/INR    PTT (PARTIAL THROMBOPLASTIN TIME)    TROPONIN-I - TO BE DRAWN  NOW    TROPONIN-I - TO BE DRAWN IN ONE HOUR AFTER INITIAL DRAW    CANCELED: TROPONIN-I - TO BE DRAWN IN 3 HOURS AFTER INITIAL DRAW    CBC WITH DIFF    Refer to [facility]) Pain Management    CANCELED: PULSE OXIMETRY - CONTINUOUS    CANCELED: ECG  12 LEAD ONE TIME    CANCELED: ECG 12 LEAD ONE TIME    CANCELED: ECG 12 LEAD ONE TIME    CANCELED: PERFORM POC ISTAT CREATININE POINT OF CARE    CANCELED: INSERT & MAINTAIN PERIPHERAL IV ACCESS    CANCELED: PERIPHERAL IV DRESSING CHANGE    morphine 2 mg/mL injection    NS bolus infusion 500 mL    capsaicin (TRIXAICIN HP) 0.075% TOPICAL CREAM    iopamidol (ISOVUE-370) 76% infusion    capsaicin (ZOSTRIX) 0.025 % Cream    lidocaine 4 % Adhesive Patch, Medicated     Laboratory Results:  Labs Ordered/Reviewed   BASIC METABOLIC PANEL - Abnormal; Notable for the following components:       Result Value    CALCIUM 8.5 (*)     GLUCOSE 165 (*)     All other components within normal limits   HEPATIC FUNCTION PANEL - Abnormal; Notable for the following components:    ALBUMIN 2.9 (*)     BILIRUBIN TOTAL 0.2 (*)     PROTEIN TOTAL 5.7 (*)     All other components within normal limits   CBC WITH DIFF - Abnormal; Notable for the following components:    RDW-CV 16.2 (*)     All other components within normal limits   LACTIC ACID LEVEL W/ REFLEX FOR LEVEL >2.0 - Normal   PT/INR - Normal   PTT (PARTIAL THROMBOPLASTIN TIME) - Normal    Narrative:     Therapeutic range for unfractionated heparin is 60-100 seconds.   TROPONIN-I - Normal   TROPONIN-I - Normal   POC ISTAT CREATININE (RESULT) - Normal   CBC/DIFF    Narrative:     The following orders were created for panel order CBC/DIFF.  Procedure                               Abnormality         Status                     ---------                               -----------         ------                     CBC WITH ZOXW[[id number]]                Abnormal            Final result                 Please view results for these tests on the individual  orders.         Radiographical Imaging:   Results for orders placed or performed during the hospital encounter of 03/24/23   CTA STROKE PROTOCOL (CTA BRAIN/CTA CAROTIDS W IV CONTRAST)     Status: None    Narrative    EXAMINATION: CTA stroke protocol with contrast    HISTORY: Headache with history of cerebrovascular accident.    This CT scanner is equipped with dose reducing technology. The exposure is automatically adjusted according to patient body size in order to deliver the lowest dose possible.    The Viz.ai computer software was utilized on this exam to evaluate for large vessel occlusion.    TECHNIQUE:  Thin section axial images were obtained from the skull vertex through the upper thorax during the administration of intravenous contrast. 80 cc of Isovue-370 were utilized. Coronal, sagittal, and 3D angiographic reconstructions were performed.    RESULTS: Atherosclerotic calcifications are seen about the right carotid bifurcation without significant luminal narrowing. There is no hemodynamically significant stenosis on the right.    On the left, the calcified plaque is somewhat more pronounced and there is complete occlusion of the proximal left ICA at its origin. The viz. AI software indicates the presence of a large vessel occlusion and this likely represents that vessel. Patient does have a history of prior left frontal cerebrovascular accident. The left ICA was occluded on recent duplex ultrasound dated 01/19/2023.    There is adequate enhancement of the intracranial vessels and circle of Willis. No significant stenosis is appreciated in any of the visualized intracranial vessels. The left MCA branches are somewhat diminutive, consistent with the history of remote left frontal infarct. No intracranial aneurysms are seen. No major branch vessel occlusions are noted.    The vertebral arteries demonstrate adequate enhancement. They are roughly codominant.    The aortic arch is considered type I with bovine  branching. This calcification about the origin of the left common carotid artery without obvious stenosis. Lungs appear mildly emphysematous. There are postoperative changes about the mediastinum.      Impression    1. No intracranial aneurysm.  2. Known chronic occlusion of the left ICA. Calcified plaque about the right carotid bifurcation resulting in no significant stenosis.  3. Adequate enhancement of the vertebral arteries bilaterally.            Radiologist location ID: [id number]     CT BRAIN WO IV CONTRAST - POSSIBLE STROKE     Status: None    Narrative    EXAMINATION: CT brain without contrast    HISTORY: Headache and left-sided neck pain. History of cerebrovascular accident.    This CT scanner is equipped with dose reducing technology. The exposure is automatically adjusted according to patient body size in order to deliver the lowest dose possible.    Technique: 3 mm axial images were obtained through the brain without intravenous contrast. Multiplanar reconstructions were performed.    Results: Comparison made to an exam dated 05/30/2021. There is moderate central and cortical atrophy typical for patients of this age. There are chronic areas of encephalomalacia involving left frontal lobe as well as the right posterior periventricular/parietal white matter consistent with remote infarcts.    There is no discrete acute infarct or intracranial hemorrhage. No extra-axial fluid collections are seen. There is no shift of midline structures. No intracranial masses are appreciated.    Small bony excrescence projecting inward from the left frontal inner table may represent a small meningioma or simply dural calcification. It is unchanged.      Impression    1. Moderate central and cortical atrophy typical for patients of this age.  2. Old left frontal and right posterior periventricular infarcts.  3. No definite acute intracranial process.            Radiologist location ID: [id number]         EKG- No results found  for this visit on 03/24/23 (from the past 720 hour(s)).      All labs were reviewed. Medical Records reviewed.     MDM/Course:  Visit Vitals  Vitals:    03/24/23 1745 03/24/23 1800 03/24/23 1830 03/24/23 1925   BP: Marland Kitchen)  124/98  127/74 132/75   Pulse:   72 82   Resp:   18 16   Temp:    36.8 C (98.2 F)   SpO2: 93% 92% 96% 96%   Weight:       Height:       BMI:             ED Course as of 03/24/23 2248   Tue Mar 24, 2023   1854 This is a 57 year old female presenting to the ER with left-sided neck pain and headache.  Patient states she has been having this similar neck pain for several months.  Broad differential diagnosis considered for her symptoms on arrival including but not limited to musculoskeletal pain, strain/sprain, migraine headache, tension headache, pinched nerve/bulging disc, rotator cuff pathology, acute vascular pathology, ICH.  Patient overall well-appearing and in no acute distress on arrival.  Broad workup initiated.  Given she reported a severe headache, CT brain was performed within about 6 hours of reported symptom onset.  Given her reports of left carotid artery occlusion, CT angio imaging ordered as well.   1929 CREATININE: 0.82   1929 LACTIC ACID: 0.8   1942 On re-evaluation, patient resting in bed in the ER in no acute distress.  She reports some improvement with medication in the ER.  I discussed the workup and results with her and family at bedside.  ACS considered but thought to be somewhat less likely given her history, presentation, and examination.  Troponin low x2.  CT angio imaging was ordered given her history and symptoms.  CT angio showed a known chronic left ICA occlusion which patient is aware of.  I also discussed with her that imaging shows old chronic left frontal and right posterior periventricular infarcts.   1942 In regards to patient's left lateral neck pain, discussed that the etiology is unclear but could be cervical in nature given her history of chronic back pain or  potentially muscular.  She is already taking narcotic   1943 Medication for pain at home and also reports she has a prescription for muscle relaxers.  I did discuss I would like to add topical medication with lidocaine patches and capsaicin cream and she agrees with this plan.  Patient already has a pain doctor as an outpatient.  I discussed that I would like her to follow up with her pain doctor and PCP as an outpatient for close re-evaluation and further evaluation of her neck pain.  Patient agrees with this plan.   1943 I discussed with the patient that if she develops new or worsening symptoms, worsening neck pain, fevers, difficulty moving or bending the neck, numbness or tingling, or any other concerning symptoms, she needs to return to the ER immediately.  She expressed understanding and agreement with plan and was given the opportunity to ask questions     Medical Decision Making  See ED course    Problems Addressed:  CT with chronic, old left frontal and right posterior periventricular infarcts: chronic illness or injury  CT with known chronic left ICA occlusion: chronic illness or injury  Neck pain: acute illness or injury    Amount and/or Complexity of Data Reviewed  Labs: ordered. Decision-making details documented in ED Course.  Radiology: ordered. Decision-making details documented in ED Course.  ECG/medicine tests: ordered and independent interpretation performed.    Risk  OTC drugs.  Prescription drug management.  Parenteral controlled substances.         Disposition: Discharged  Following the above history, physical exam, and studies, the patient was deemed stable and suitable for discharge. Discharge and medication instructions were discussed with the patient/patient's family and all questions were addressed. The patient understands that they may return to the ED at any time for new or worsening symptoms, or if they have any other concerns.      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  Awilda Bill, MD 03/24/2023  7:44 PM          Follow Up: Oneta Rack, MD  [address]  [place] [address]  [phone]    Go in [redacted] week      [facility] - Emergency Department  [address]  [place] [address]  [phone]    As needed, If symptoms worsen    Medications Prescribed:   Discharge Medication List as of 03/24/2023  7:47 PM        START taking these medications    Details   capsaicin (ZOSTRIX) 0.025 % Cream Apply topically Four times a day as needed for up to 10 days  Disp-120 g, R-0  Print      lidocaine 4 % Adhesive Patch, Medicated Place 1 Patch on the skin Once a day for 10 days, Disp-10 Patch, R-0, Print                 Dr. Awilda Bill, MD 03/24/2023, 22:48  Emergency Medicine  [facility]

## 2023-03-24 NOTE — Discharge Instructions (Signed)
As we discussed, please follow up with your primary care doctor and pain doctor as an outpatient for re-evaluation of your symptoms.  We are giving you a prescription for lidocaine patches and capsaicin cream to use as needed for your symptoms.  If you develop new or worsening symptoms, recurrent or worsening neck pain, fevers, difficulty moving or bending the neck, new numbness and tingling, or any other concerning symptoms, please return to the ER immediately.  It was a pleasure caring for you.

## 2023-03-24 NOTE — ED Triage Notes (Signed)
Left sided neck pain on/off worse with palpation/movement

## 2023-03-25 DIAGNOSIS — G8929 Other chronic pain: Secondary | ICD-10-CM

## 2023-03-25 DIAGNOSIS — K859 Acute pancreatitis without necrosis or infection, unspecified: Secondary | ICD-10-CM

## 2023-04-01 NOTE — Addendum Note (Signed)
Addended by: Armstead Peaks on: 04/01/2023 10:58 AM   Modules accepted: Orders

## 2023-04-01 NOTE — Telephone Encounter (Signed)
No message was sent to [place] to send referral there an dlooking back message never routed to [place] to do this either. I will work on on this

## 2023-04-01 NOTE — Telephone Encounter (Signed)
Referral faxed. I also tried calling them but got VM

## 2023-04-01 NOTE — Telephone Encounter (Signed)
Tammy/Mindy: what is status of pain management referral? Patient had requested to be referred to the below practice:  [facility]   [address] [place] [phone] or [phone]  Can you please send MyChart to update her? Thanks!

## 2023-04-08 ENCOUNTER — Other Ambulatory Visit: Payer: Self-pay

## 2023-04-08 NOTE — Telephone Encounter (Signed)
I have faxed referral. 

## 2023-04-08 NOTE — Telephone Encounter (Signed)
Mindy, I'm sorry, but now she is asking to refer to the one below.     [facility] [address] Francisca December [phone] office [phone] office [phone] fax [phone] [place]

## 2023-04-09 ENCOUNTER — Ambulatory Visit (INDEPENDENT_AMBULATORY_CARE_PROVIDER_SITE_OTHER): Payer: Commercial Managed Care - PPO | Admitting: Family Medicine

## 2023-04-09 ENCOUNTER — Encounter (INDEPENDENT_AMBULATORY_CARE_PROVIDER_SITE_OTHER): Payer: Self-pay | Admitting: Family Medicine

## 2023-04-09 VITALS — BP 122/62 | HR 70 | Temp 96.8°F | Resp 16 | Ht 66.0 in | Wt 208.0 lb

## 2023-04-09 DIAGNOSIS — J309 Allergic rhinitis, unspecified: Secondary | ICD-10-CM

## 2023-04-09 DIAGNOSIS — Z1211 Encounter for screening for malignant neoplasm of colon: Secondary | ICD-10-CM

## 2023-04-09 DIAGNOSIS — Z1231 Encounter for screening mammogram for malignant neoplasm of breast: Secondary | ICD-10-CM

## 2023-04-09 DIAGNOSIS — E1165 Type 2 diabetes mellitus with hyperglycemia: Secondary | ICD-10-CM

## 2023-04-09 DIAGNOSIS — Z0000 Encounter for general adult medical examination without abnormal findings: Secondary | ICD-10-CM

## 2023-04-09 DIAGNOSIS — Z13820 Encounter for screening for osteoporosis: Secondary | ICD-10-CM

## 2023-04-09 DIAGNOSIS — Z122 Encounter for screening for malignant neoplasm of respiratory organs: Secondary | ICD-10-CM

## 2023-04-09 MED ORDER — PREDNISONE 20 MG TABLET
ORAL_TABLET | ORAL | 0 refills | Status: DC
Start: 2023-04-09 — End: 2023-05-03

## 2023-04-09 NOTE — Patient Instructions (Signed)
Medicare Preventive Services  Medicare coverage information Recommendation for YOU   Heart Disease and Diabetes   Lipid profile Every 5 years or more often if at risk for cardiovascular disease     Lab Results   Component Value Date    CHOLESTEROL 272 (H) 01/30/2023    HDLCHOL 44 (L) 01/30/2023    LDLCHOL 206 (H) 01/30/2023    TRIG 121 01/30/2023           Diabetes Screening    Yearly for those at risk for diabetes, 2 tests per year for those with prediabetes Last Glucose: 177    Diabetes Self Management Training or Medical Nutrition Therapy  For those with diabetes, up to 10 hrs initial training within a year, subsequent years up to 2 hrs of follow up training Optional for those with diabetes     Medical Nutrition Therapy  Three hours of one-on-one counseling in first year, two hours in subsequent years Optional for those with diabetes, kidney disease   Intensive Behavioral Therapy for Obesity  Face-to-face counseling, first month every week, month 2-6 every other week, month 7-12 every month if continued progress is documented Optional for those with Body Mass Index 30 or higher  Your Body mass index is 33.57 kg/m.   Tobacco Cessation (Quitting) Counseling   Covers up to 8 smoking and tobacco-use cessation counseling sessions in a 61-month period.    Optional for those that use tobacco   Cancer Screening Last Completion Date   Colorectal screening   For anyone age 55 to 61 or any age if high risk:  Screening Colonoscopy every 10 yrs if low risk,  more frequent if higher risk  OR  Cologuard Stool DNA test once every 3 years OR  Fecal Occult Blood Testing yearly OR  Flexible  Sigmoidoscopy  every 5 yr OR  CT Colonography every 5 yrs    ?  See below for due date if applicable.   Screening Pap Test   Recommended every 3 years for all women age 70 to 83, or every five years if combined with HPV test (routine screening not needed after total hysterectomy).  Medicare covers every 2 years or yearly if high  risk.  Screening Pelvic Exam   Medicare covers every 2 years, yearly if high risk or childbearing age with abnormal Pap in last 3 yrs.   ?  See below for due date if applicable.   Screening Mammogram   Recommended every 2 years for women age 3 to 50, or more frequent if you have a higher risk. Selectively recommended for women between 40-49 based on shared decisions about risk. Covered by Medicare up to every year for women age 24 or older --02/06/2021  See below for due date if applicable.         Lung Cancer Screening  Annual low dose computed tomography (LDCT scan) is recommended for those age 59-80 who smoked 20 pack-years and are current smokers or quit smoking within past 15 years, after counseling by your doctor or nurse clinician about the possible benefits or harms.   ?  See below for due date if applicable.   Vaccinations   Respiratory syncytial virus (RSV)  Age 63 years or older: Based on shared clinical decision-making with your provider.  Pneumococcal Vaccine  Recommended routinely age 6+ with one or two separate vaccines based on your risk. Recommended before age 36 if medical conditions with increased risk  Seasonal Influenza Vaccine  Once every flu season  Hepatitis B Vaccine  3 doses if risk (including anyone with diabetes or liver disease)  Shingles Vaccine  Two doses at age 19 or older  Diphtheria Tetanus Pertussis Vaccine  ONCE as adult, booster every 10 years     Immunization History   Administered Date(s) Administered   . Covid-19 Vaccine,Moderna,12 Years+ 02/09/2020, 03/08/2020, 10/15/2020   . Influenza Vaccine, 6 month-adult 08/08/2010, 10/13/2012, 08/06/2017, 09/16/2018, 08/31/2019, 10/02/2020, 09/12/2021, 09/18/2022   . Pneumovax 10/02/2010   . VAXNEUVANCE 09/12/2021     Shingles vaccine and Diphtheria Tetanus Pertussis vaccines are available at pharmacies or local health department without a prescription.   Other Preventative Screening  Last Completion Date   Bone Densitometry    Screening: All females ages 78 and older every 10 years if initial screening normal. Postmenopausal women ages 44-64 need screening with one or more risk factor: previous fracture, parental hip fracture, current smoker, low body weight, excessive alcohol use, Rheumatoid Arthritis   For women with diagnosed Osteoporosis, follow up is recommended every 2 years or a frequency recommended by your provider.     ?  See below for due date if applicable.     Glaucoma Screening   Yearly if in high risk group such as diabetes, family history, African American age 17+ or Hispanic American age 58+   See your eye care provider for screening.   Hepatitis C Screening   Recommended  for those born between ages 18-79 years.   --01/30/2023  See below for due date if applicable.     HIV Testing  Recommended routinely at least ONCE, covered every year for age 18 to 25 regardless of risk, and every year for age over 51 who ask for the test or higher risk. Yearly or up to 3 times in pregnancy       --01/30/2023  See below for due date if applicable.   Abdominal Aortic Aneurysm Screening Ultrasound   Once with a family history of abdominal aortic aneurysms OR a female between65-75 and have smoked at least 100 cigarettes in your lifetime.       ?  See below for due date if applicable.       Your Personalized Schedule for Preventive Tests   Health Maintenance: Pending and Last Completed       Date Due Completion Date    Diabetic Retinal Exam Never done ---    Osteoporosis screening Never done ---    Adult Tdap-Td (1 - Tdap) Never done ---    Hepatitis B Vaccine (1 of 3 - 19+ 3-dose series) Never done ---    Colonoscopy Never done ---    Pap smear 10/18/2013 10/18/2010    Shingles Vaccine (1 of 2) Never done ---    CT Lung Cancer Screening Never done ---    Covid-19 Vaccine (4 - 2023-24 season) 08/08/2022 10/15/2020    Diabetic Kidney Health Microalb/Cr Ratio 12/19/2022 12/19/2021    Mammography 02/07/2023 02/06/2021    Diabetic A1C 04/30/2023  01/30/2023    Diabetic Kidney Health eGFR 03/23/2024 03/24/2023             Non-Opioid Treatment for Chronic Pains   Treatment for chronic pain can be managed without opioids. Below are non-opioid options that may be considered and discussed with your provider to determine which options would be best for your health.    Over the counter or presciptions medications:  Acetaminophen (Tylenol) or Non-steroidal anti-inflammatories such as: Ibuprofen (Motrin, Advil), naproxen (Aleve), aspirin  Antidepressants such as  amitriptyline, nortriptyline (Pamelor),  Doxepin (Silenor), Imipramine (Tofranil) and others.  Anticonvulsant Nerve pain medications: Gabapentin (Neurontin), pregabalin (Lyrica)  Externally applied medications such as NSAID'S, lidocaine, capsaisin, and others  Injections: pain specialists can sometimes inject medications at the site of pain.    Alternative therapies such as  Acupuncture  Osteopathic manipulation  Chiropractic  Massage therapy       For Information on Advanced Directives for Health Care:  [place]:  [email or web address]  [place], [place], [place], [place] General Information: [email or web address]

## 2023-04-09 NOTE — Progress Notes (Signed)
FAMILY MEDICINE, [facility] PRIMARY CARE  [address]  [place] [address]    Medicare Annual Wellness Visit    Name: Monique [name] MRN:  [id number]   Date: 04/09/2023 Age: 57 y.o.       SUBJECTIVE:   Monique [name] is a 57 y.o. female for presenting for Medicare Wellness exam.   I have reviewed and reconciled the medication list with the patient today.    Comprehensive Health Assessment:    Paper document COMPREHENSIVE HEALTH ASSESSMENT reviewed and scanned into medical record    I have reviewed and updated as appropriate the past medical, family and social history. 04/09/2023 as summarized below:  Past Medical History:   Diagnosis Date    Anxiety     Arthritis     Back problem     DDD    Beta blocker prescribed for left ventricular systolic dysfunction     Blood thinned due to long-term anticoagulant use     Carotid stenosis     occlusion of left ICA    Chest pain 04/03/2018    Chronic back pain     Chronic obstructive pulmonary disease, unspecified COPD type (CMS HCC) 12/31/2022    Coronary artery disease involving native coronary artery of native heart     DDD (degenerative disc disease), lumbar     "entire spine"    Ear piercing     GERD (gastroesophageal reflux disease)     controlled with omeprazole    H/O complete eye exam     2 years, Dr. Thompson Caul, at [facility]    History of dental examination     dentures upper and lower    HTN     Hx of coronary artery bypass graft     2012    Hx of gastric bypass     Hypercholesterolemia     Hyperlipidemia     Hypothyroid     MI (myocardial infarction) (CMS HCC) 2012    Neck problem     herniated cervical disc    Obesity (BMI 30-39.9)     Peripheral vascular disease, unspecified (CMS HCC) 01/03/2021    Solitary nodule of right lobe of thyroid 03/30/2018    Incidental finding on MRI thoracic spine. [place] ordered.     Stented coronary artery     x4    Stroke (CMS [facility])     2002    Tattoo     Left breast, Right shoulder    Thyroid disorder     Thyroid nodule      Type 2 diabetes mellitus (CMS HCC)     HGA1C 10.4 on 12/19/21    Uncontrolled type 2 diabetes mellitus, without long-term current use of insulin     Xanthelasma      Past Surgical History:   Procedure Laterality Date    Endometrial ablation  1998    Hx ankle fracture tx  1990s    Hx cataract removal Bilateral 2013    Hx cesarean section  06/20/2000    Hx cholecystectomy  1988    Hx coronary artery bypass graft  03/31/2011    Hx coronary stent placement  2012    Hx gastric sleeve  02/2017    Hx heart catheterization  2012    Hx thyroid biopsy      Hx thyroidectomy  101/01/2018    Hx tonsillectomy      Hx yag Left 09/20/2013     Current Outpatient Medications   Medication Sig  aspirin (ECOTRIN) 81 mg Oral Tablet, Delayed Release (E.C.) Take 1 Tablet (81 mg total) by mouth Once a day    Blood Sugar Diagnostic (ACCU-CHEK GUIDE TEST STRIPS) Does not apply Strip TEST BLOOD SUGAR THREE TIMES DAILY    Blood-Glucose Meter (ACCU-CHEK GUIDE GLUCOSE METER) Misc USE AS DIRECTED    capsaicin (ZOSTRIX) 0.025 % Cream Apply topically Four times a day as needed for up to 10 days    clopidogreL (PLAVIX) 75 mg Oral Tablet TAKE 1 TABLET EVERY DAY    cyanocobalamin (VITAMIN B12) 1,000 mcg/mL Injection Solution INJECT 1 ML (1,000 MCG TOTAL) UNDER THE SKIN EVERY 30 DAYS    docusate sodium (COLACE) 100 mg Oral Capsule TAKE 1 CAPSULE TWICE DAILY    empagliflozin (JARDIANCE) 10 mg Oral Tablet Take 1 Tablet (10 mg total) by mouth Once a day    ergocalciferol, vitamin D2, (DRISDOL) 1,250 mcg (50,000 unit) Oral Capsule TAKE 1 CAPSULE BY MOUTH EVERY WEEK    ezetimibe (ZETIA) 10 mg Oral Tablet Take 1 Tablet (10 mg total) by mouth Every evening    fluticasone propionate (FLONASE) 50 mcg/actuation Nasal Spray, Suspension Administer 2 Sprays into each nostril Once a day    furosemide (LASIX) 40 mg Oral Tablet TAKE 1 TABLET EVERY DAY    HYDROcodone-acetaminophen (NORCO) 10-325 mg Oral Tablet Take 1 Tablet by mouth Every 8 hours as needed for Pain  Reuben Likes with [facility]    Ibuprofen (MOTRIN) 800 mg Oral Tablet Take 1 Tablet (800 mg total) by mouth Three times a day as needed for Pain    Lancets (ACCU-CHEK SOFTCLIX LANCETS) Misc USE AS DIRECTED DAILY    levothyroxine (SYNTHROID) 25 mcg Oral Tablet TAKE 1 TABLET ONE TIME DAILY    metoprolol succinate (TOPROL-XL) 25 mg Oral Tablet Sustained Release 24 hr Take 0.5 Tablets (12.5 mg total) by mouth Once a day    NARCAN 4 mg/actuation Nasal Spray, Non-Aerosol PT STATED SHE HAS NARCAN AT HOME BUT IS NOT TAKING IT    Needle, Disp, 25 G 25 gauge x 1" Needle 1 mL Every 30 days    niacin (NIASPAN) 500 mg Oral Tablet Sustained Release 24 hr TAKE 1 TABLET EVERY DAY    nitroGLYCERIN (NITROSTAT) 0.4 mg Sublingual Tablet, Sublingual Place 1 Tablet (0.4 mg total) under the tongue Every 5 minutes as needed for Chest pain for 3 doses over 15 minutes    omeprazole (PRILOSEC) 40 mg Oral Capsule, Delayed Release(E.C.) TAKE 1 CAPSULE EVERY DAY    ondansetron (ZOFRAN ODT) 8 mg Oral Tablet, Rapid Dissolve DISSOLVE 1 TABLET ON THE TONGUE EVERY 8 HOURS AS NEEDED FOR NAUSEA AND VOMITING    OZEMPIC 2 mg/dose (8 mg/3 mL) Subcutaneous Pen Injector Inject 2 mg under the skin Every 7 days    pioglitazone (ACTOS) 15 mg Oral Tablet Take 1 Tablet (15 mg total) by mouth Once a day    predniSONE (DELTASONE) 20 mg Oral Tablet Take 3 tabs PO daily for 3 days, then 2 tabs PO daily for 3 days, then 1 tab PO daily for 3 days.    PRENATAL PLUS, CALCIUM CARB, 27 mg iron- 1 mg Oral Tablet Take 1 Tablet by mouth Once a day    rosuvastatin (CRESTOR) 40 mg Oral Tablet Take 1 Tablet (40 mg total) by mouth Once a day TAKE 1 TABLET BY MOUTH ONCE DAILY    venlafaxine (EFFEXOR XR) 37.5 mg Oral Capsule, Sust. Release 24 hr TAKE 1 CAPSULE ONE TIME DAILY  ZYRTEC-D 5-120 mg Oral Tablet Sustained Release 12 hr Take 1 Tablet by mouth Once a day     Family Medical History:       Problem Relation (Age of Onset)    Atrial fibrillation Sister     Colon Cancer Father    Diabetes Mother, Maternal Aunt    Heart Disease Sister, Maternal Grandfather    Hypertension (High Blood Pressure) Mother    Lung Cancer Maternal Grandfather    MI <79 years of age Mother            Social History     Socioeconomic History    Marital status: Married     Spouse name: Harvie Heck    Number of children: 3    Years of education: completed 11th grade   Occupational History    Occupation: disabled   Tobacco Use    Smoking status: Every Day     Current packs/day: 0.50     Average packs/day: 0.5 packs/day for 43.3 years (21.7 ttl pk-yrs)     Types: Cigarettes     Start date: 36    Smokeless tobacco: Never   Vaping Use    Vaping status: Never Used   Substance and Sexual Activity    Alcohol use: No    Drug use: No    Sexual activity: Yes     Partners: Male   Social History Narrative    Living situation: lives at home with husband, Harvie Heck, and daughter.  1 dog named Duke and 1 cat named Lucky.    Nutrition:  Eats 5 small meals d/t gastric sleeve surgery.     Caffeine use: none, decaf coffee    Exercise: stays busy around house. No formal form of exercise    Seatbelt use: sometimes    Fire extinguishers in the home: yes    Smoke alarms in the home: yes    Carbon monoxide detectors in the home: yes    Wynelle Link exposure: sunglasses        Monique [name] would like for husband, Coriann Starbird,  to speak for her in the event she would become incapacitated.    Full code.      Social Determinants of Health     Social Connections: Medium Risk (02/17/2023)    Social Connections     SDOH Social Isolation: 3 to 5 times a week   Health Literacy: Medium Risk (02/17/2023)    Health Literacy     SDOH Health Literacy: Sometimes         List of Current Health Care Providers   Care Team       PCP       Name Type Specialty Phone Number    Oneta Rack, MD Physician FAMILY MEDICINE [phone]              Care Team       Name Type Specialty Phone Number    Harvie Junior, MD Not available GASTROENTEROLOGY [phone]     Reita May., MD Not available PAIN MANAGEMENT [phone]    Duane Boston, MD Physician INTERVENTIONAL CARDIOLOGY  [phone]                      Health Maintenance   Topic Date Due    Diabetic Retinal Exam  Never done    Osteoporosis screening  Never done    Adult Tdap-Td (1 - Tdap) Never done    Hepatitis B Vaccine (1 of 3 - 19+  3-dose series) Never done    Colonoscopy  Never done    Pap smear  10/18/2013    Shingles Vaccine (1 of 2) Never done    CT Lung Cancer Screening  Never done    Covid-19 Vaccine (4 - 2023-24 season) 08/08/2022    Diabetic Kidney Health Microalb/Cr Ratio  12/19/2022    Mammography  02/07/2023    Diabetic A1C  04/30/2023    Diabetic Kidney Health eGFR  03/23/2024    Hepatitis C screening  Completed    HIV Screening  Completed    Influenza Vaccine  Completed    Medicare Annual Wellness Visit - Calendar Year Insurers  Completed    Pneumococcal Vaccine, Age 57-64  Completed    Meningococcal Vaccine  Aged Out     Medicare Wellness Assessment   Medicare initial or wellness physical in the last year?: No  Advance Directives   Does patient have a living will or MPOA: No           Advance directive information given to the patient today?: Patient Declined      Activities of Daily Living   Do you need help with dressing, bathing, or walking?: No   Do you need help with shopping, housekeeping, medications, or finances?: No   Do you have rugs in hallways, broken steps, or poor lighting?: No   Do you have grab bars in your bathroom, non-slip strips in your tub, and hand rails on your stairs?: No   Cognitive Function Screen (1=Yes, 0=No)   What is you age?: Correct   What is the time to the nearest hour?: Correct   What is the year?: Correct   What is the name of this clinic?: Correct   Can the patient recognize two persons (the doctor, the nurse, home help, etc.)?: Correct   What is the date of your birth? (day and month sufficient) : Correct   In what year did World War II end?:  Correct   Who is the current president of the [place]?: Correct   Count from 20 down to 1?: Correct   What address did I give you earlier?: Correct   Total Score: 10   Interpretation of Total Score: Greater than 6 Normal   Fall Risk Screen   Do you feel unsteady when standing or walking?: No  Do you worry about falling?: No  Have you fallen in the past year?: No   Depression Screen     Little interest or pleasure in doing things.: Not at all  Feeling down, depressed, or hopeless: Not at all  PHQ 2 Total: 0     Pain Score   Pain Score:   3    Substance Use-Abuse Screening     Tobacco Use     In Past 12 MONTHS, how often have you used any tobacco product (for example, cigarettes, e-cigarettes, cigars, pipes, or smokeless tobacco)?: Daily  In the PAST 3 MONTHS, did you smoke a cigarette containing tobacco or use any other nicotine delivery product (i.e., e-cigarette, vaping or chewing tobacco)?: Yes  In the PAST 3 MONTHS, did you usually smoke more than 10 cigarettes, vape, use an e-cigarette or chew tobacco more than 10 times each day?: Yes  In the PAST 3 MONTHS, did you usually smoke/use an e-cigarette, vape or chew tobacco within 30 minutes after waking?: Yes     Alcohol use     In the PAST 12 MONTHS, how often have you had 5 (men)/4 (  women) or more drinks containing alcohol in one day?: Never     Prescription Drug Use     In the PAST 12 months, how often have you used any prescription medications just for the feeling, more than prescribed, or that were not prescribed for you? Prescriptions may include: opioids, benzodiazepines, medications for ADHD: Never           Illicit Drug Use   In the PAST 12 MONTHS, how often have you used any drugs, including marijuana, cocaine or crack, heroin, methamphetamine, hallucinogens, ecstasy/MDMA?: Never                   OBJECTIVE:     BP 122/62   Pulse 70   Temp 36 C (96.8 F)   Resp 16   Ht 1.676 m (5\' 6" )   Wt 94.3 kg (208 lb)   LMP  (LMP Unknown)   SpO2 98%    BMI 33.57 kg/m       Physical Examination:    GENERAL:   Pt is a pleasant, well-nourished, well-developed 57 y.o. female who is in NAD. Appears stated age  HEENT:  head normocephalic, symmetrical facies. EOM intact b/l. PERRLA. Sclera non-icteric, non-injected. upper eyelid w/Xythoma-lipid plaques, diminished in fullness and intensity. Oropharyngeal mucous membranes are moist  w/o erythema/exudates   NECK:   No masses, no lymphadenopathy, no JVD on exam. Trachea midline, no thyromegaly   CV: S1, S2. No murmurs, rubs, gallops.     LUNGS: CTAB, No rhonchi, rales, wheezes.   GI: (+) BS in all 4 quadrants. soft, NT/ND. No rigidity/positive for guarding/negative for rebound. No organomegaly/masses, or abdominal bruits,   MSK: Spontaneous normal AROM of major joints as observed during exam. No joint effusions/ swelling/ deformities.   Fourth digit he of the left lower extremity is swollen and erythematous.  Tender to touch.  No BLE edema, clubbing, or cyanosis.   2+ radial pulse present b/l.   + 2 reflexes Brachioradialis and patellar bilaterally.   Gait normal.  SKIN: No significant lesions, rashes, ecchymoses noted.   NEURO: AAOx4, CN grossly intact. Sensation intact b/l. No focal deficits.  PSYCH: Mood, behavior and affect normal w/ Intact judgement & insight  Exam stable.       Other appropriate exam:    Health Maintenance Due   Topic Date Due    Diabetic Retinal Exam  Never done    Osteoporosis screening  Never done    Adult Tdap-Td (1 - Tdap) Never done    Hepatitis B Vaccine (1 of 3 - 19+ 3-dose series) Never done    Colonoscopy  Never done    Pap smear  10/18/2013    Shingles Vaccine (1 of 2) Never done    CT Lung Cancer Screening  Never done    Covid-19 Vaccine (4 - 2023-24 season) 08/08/2022    Diabetic Kidney Health Microalb/Cr Ratio  12/19/2022    Mammography  02/07/2023      ASSESSMENT & PLAN:     Assessment/Plan   1. Encounter for screening for osteoporosis    2. Screening mammogram for breast cancer    3.  Encounter for screening for malignant neoplasm of lung    4. Type 2 diabetes mellitus with hyperglycemia, without long-term current use of insulin (CMS HCC)    5. Encounter for screening for malignant neoplasm of colon    6. Medicare annual wellness visit, subsequent    7. Allergic sinusitis       Identified Risk  Factors/ Recommended Actions       The PHQ 2 Total: 0 depression screen is interpreted as negative.      Opioid use plan of care:         Opioids use: Plan: Assessment of pain completed and pain controlled    Patient declined Advanced Directives information.    Sinusitis:  Likely allergy induced.    Continue histamines.    No need for antibiotic.    I will offer steroid burst.  Call with results.        Orders Placed This Encounter    DEXA BONE DENSITOMETRY    MAMMO BILATERAL SCREENING-ADDL VIEWS/BREAST US AS REQ BY RAD    CT LUNG SCREENING LDCT    MICROALBUMIN/CREATININE RATIO, URINE, RANDOM    Refer to [facility]) General Surgery, [facility]    predniSONE (DELTASONE) 20 mg Oral Tablet          The patient has been educated about risk factors and recommended preventive care. Written Prevention Plan completed/ updated and given to patient (see After Visit Summary).    No follow-ups on file.    Oneta Rack, MD    This note was partially generated using MModal Fluency Direct system, and there may be some incorrect words, spellings, and punctuation that were not noted in checking the note before saving.

## 2023-04-09 NOTE — Nursing Note (Signed)
04/09/23 1343   Medicare Wellness Assessment   Medicare initial or wellness physical in the last year? No   Advance Directives   Does patient have a living will or MPOA No   Advance directive information given to the patient today? Patient Declined   Activities of Daily Living   Do you need help with dressing, bathing, or walking? No   Do you need help with shopping, housekeeping, medications, or finances? No   Do you have rugs in hallways, broken steps, or poor lighting? No   Do you have grab bars in your bathroom, non-slip strips in your tub, and hand rails on your stairs? No   Cognitive Function Screen   What is you age? 1   What is the time to the nearest hour? 1   What is the year? 1   What is the name of this clinic? 1   Can the patient recognize two persons (the doctor, the nurse, home help, etc.)? 1   What is the date of your birth? (day and month sufficient)  1   In what year did World War II end? 1   Who is the current president of the [place]? 1   Count from 20 down to 1? 1   What address did I give you earlier? 1   Total Score 10   Interpretation of Total Score Greater than 6 Normal   Depression Screen   Little interest or pleasure in doing things. 0   Feeling down, depressed, or hopeless 0   PHQ 2 Total 0   Pain Score   Pain Score Three   Substance Use Screening   In Past 12 MONTHS, how often have you used any tobacco product (for example, cigarettes, e-cigarettes, cigars, pipes, or smokeless tobacco)? Daily   In the PAST 3 MONTHS, did you smoke a cigarette containing tobacco or use any other nicotine delivery product (i.e., e-cigarette, vaping or chewing tobacco)? Yes   In the PAST 3 MONTHS, did you usually smoke more than 10 cigarettes, vape, use an e-cigarette or chew tobacco more than 10 times each day? Yes   In the PAST 3 MONTHS, did you usually smoke/use an e-cigarette, vape or chew tobacco within 30 minutes after waking? Yes   In the PAST 12 MONTHS, how often have you had 5 (men)/4  (women) or more drinks containing alcohol in one day? Never   In the PAST 12 months, how often have you used any prescription medications just for the feeling, more than prescribed, or that were not prescribed for you? Prescriptions may include: opioids, benzodiazepines, medications for ADHD Never   In the PAST 12 MONTHS, how often have you used any drugs, including marijuana, cocaine or crack, heroin, methamphetamine, hallucinogens, ecstasy/MDMA? Never   Fall Risk Assessment   Do you feel unsteady when standing or walking? No   Do you worry about falling? No   Have you fallen in the past year? No       Monique Garibaldi, LPN

## 2023-04-10 ENCOUNTER — Other Ambulatory Visit (INDEPENDENT_AMBULATORY_CARE_PROVIDER_SITE_OTHER): Payer: Self-pay | Admitting: Family Medicine

## 2023-04-10 DIAGNOSIS — F17210 Nicotine dependence, cigarettes, uncomplicated: Secondary | ICD-10-CM

## 2023-04-10 DIAGNOSIS — Z13820 Encounter for screening for osteoporosis: Secondary | ICD-10-CM

## 2023-04-10 DIAGNOSIS — Z1211 Encounter for screening for malignant neoplasm of colon: Secondary | ICD-10-CM

## 2023-04-10 DIAGNOSIS — E1165 Type 2 diabetes mellitus with hyperglycemia: Secondary | ICD-10-CM

## 2023-04-10 DIAGNOSIS — Z1231 Encounter for screening mammogram for malignant neoplasm of breast: Secondary | ICD-10-CM

## 2023-04-10 DIAGNOSIS — Z122 Encounter for screening for malignant neoplasm of respiratory organs: Secondary | ICD-10-CM

## 2023-04-10 DIAGNOSIS — Z0000 Encounter for general adult medical examination without abnormal findings: Secondary | ICD-10-CM

## 2023-04-10 DIAGNOSIS — J309 Allergic rhinitis, unspecified: Secondary | ICD-10-CM

## 2023-05-01 ENCOUNTER — Other Ambulatory Visit (HOSPITAL_BASED_OUTPATIENT_CLINIC_OR_DEPARTMENT_OTHER): Payer: Commercial Managed Care - PPO | Admitting: Radiology

## 2023-05-01 ENCOUNTER — Ambulatory Visit (HOSPITAL_BASED_OUTPATIENT_CLINIC_OR_DEPARTMENT_OTHER): Payer: Commercial Managed Care - PPO

## 2023-05-03 ENCOUNTER — Encounter (INDEPENDENT_AMBULATORY_CARE_PROVIDER_SITE_OTHER): Payer: Self-pay

## 2023-05-03 ENCOUNTER — Other Ambulatory Visit: Payer: Self-pay

## 2023-05-03 ENCOUNTER — Ambulatory Visit (INDEPENDENT_AMBULATORY_CARE_PROVIDER_SITE_OTHER): Payer: Commercial Managed Care - PPO | Admitting: NURSE PRACTITIONER

## 2023-05-03 VITALS — BP 118/78 | HR 84 | Temp 97.9°F | Resp 18 | Ht 66.0 in | Wt 205.0 lb

## 2023-05-03 DIAGNOSIS — R059 Cough, unspecified: Secondary | ICD-10-CM

## 2023-05-03 DIAGNOSIS — J329 Chronic sinusitis, unspecified: Secondary | ICD-10-CM

## 2023-05-03 DIAGNOSIS — F17210 Nicotine dependence, cigarettes, uncomplicated: Secondary | ICD-10-CM

## 2023-05-03 MED ORDER — BENZONATATE 100 MG CAPSULE
100.0000 mg | ORAL_CAPSULE | Freq: Three times a day (TID) | ORAL | 0 refills | Status: DC | PRN
Start: 2023-05-03 — End: 2023-12-25

## 2023-05-03 MED ORDER — CEFDINIR 300 MG CAPSULE
300.0000 mg | ORAL_CAPSULE | Freq: Two times a day (BID) | ORAL | 0 refills | Status: AC
Start: 2023-05-03 — End: 2023-05-13

## 2023-05-03 NOTE — Progress Notes (Signed)
URGENT CARE, [facility] URGENT CARE  [address]  [place] [address]    Progress Note    Name: Monique [name] MRN:  [id number]   Date: 05/03/2023 DOB:  November 12, 1966 (57 y.o.)             Reason for Visit: Headache (Headache for 3 days; also notes sneezing, runny nose, sinus pressure, and cough)    History of Present Illness  Monique [name] is a 57 y.o. female who is being seen today for The above stated symptoms, denies alleviating factors.      Nursing Notes:  There are no exam notes on file for this visit.    Past Medical History:   Diagnosis Date    Anxiety     Arthritis     Back problem     DDD    Beta blocker prescribed for left ventricular systolic dysfunction     Blood thinned due to long-term anticoagulant use     Carotid stenosis     occlusion of left ICA    Chest pain 04/03/2018    Chronic back pain     Chronic obstructive pulmonary disease, unspecified COPD type (CMS HCC) 12/31/2022    Coronary artery disease involving native coronary artery of native heart     DDD (degenerative disc disease), lumbar     "entire spine"    Ear piercing     GERD (gastroesophageal reflux disease)     controlled with omeprazole    H/O complete eye exam     2 years, Dr. Thompson Caul, at [facility]    History of dental examination     dentures upper and lower    HTN     Hx of coronary artery bypass graft     2012    Hx of gastric bypass     Hypercholesterolemia     Hyperlipidemia     Hypothyroid     MI (myocardial infarction) (CMS HCC) 2012    Neck problem     herniated cervical disc    Obesity (BMI 30-39.9)     Peripheral vascular disease, unspecified (CMS HCC) 01/03/2021    Solitary nodule of right lobe of thyroid 03/30/2018    Incidental finding on MRI thoracic spine. [place] ordered.     Stented coronary artery     x4    Stroke (CMS [facility])     2002    Tattoo     Left breast, Right shoulder    Thyroid disorder     Thyroid nodule     Type 2 diabetes mellitus (CMS HCC)     HGA1C 10.4 on 12/19/21    Uncontrolled type 2 diabetes  mellitus, without long-term current use of insulin     Xanthelasma          Past Surgical History:   Procedure Laterality Date    ENDOMETRIAL ABLATION  1998    HX ANKLE FRACTURE TX  1990s    Right, [name] procedure    HX CATARACT REMOVAL Bilateral 2013    r and l eyes with implants    HX CESAREAN SECTION  06/20/2000    x3, 11/21/86, 11/10/84    HX CHOLECYSTECTOMY  1988    HX CORONARY ARTERY BYPASS GRAFT  03/31/2011    cardiac, 2 vessel, Dr. Francesco Runner, [facility]    HX CORONARY STENT PLACEMENT  2012    x4 stents    HX GASTRIC SLEEVE  02/2017    [place], [place], Dr. Catha Gosselin  HX HEART CATHETERIZATION  2012    massive heart attack and stents - [facility]    HX THYROID BIOPSY      HX THYROIDECTOMY  101/01/2018    HX TONSILLECTOMY      as child    HX YAG Left 09/20/2013         Current Outpatient Medications   Medication Sig    aspirin (ECOTRIN) 81 mg Oral Tablet, Delayed Release (E.C.) Take 1 Tablet (81 mg total) by mouth Once a day    benzonatate (TESSALON) 100 mg Oral Capsule Take 1 Capsule (100 mg total) by mouth Three times a day as needed for Cough    Blood Sugar Diagnostic (ACCU-CHEK GUIDE TEST STRIPS) Does not apply Strip TEST BLOOD SUGAR THREE TIMES DAILY    Blood-Glucose Meter (ACCU-CHEK GUIDE GLUCOSE METER) Misc USE AS DIRECTED    cefdinir (OMNICEF) 300 mg Oral Capsule Take 1 Capsule (300 mg total) by mouth Twice daily for 10 days    clopidogreL (PLAVIX) 75 mg Oral Tablet TAKE 1 TABLET EVERY DAY    cyanocobalamin (VITAMIN B12) 1,000 mcg/mL Injection Solution INJECT 1 ML (1,000 MCG TOTAL) UNDER THE SKIN EVERY 30 DAYS    docusate sodium (COLACE) 100 mg Oral Capsule TAKE 1 CAPSULE TWICE DAILY    empagliflozin (JARDIANCE) 10 mg Oral Tablet Take 1 Tablet (10 mg total) by mouth Once a day    ergocalciferol, vitamin D2, (DRISDOL) 1,250 mcg (50,000 unit) Oral Capsule TAKE 1 CAPSULE BY MOUTH EVERY WEEK    ezetimibe (ZETIA) 10 mg Oral Tablet Take 1 Tablet (10 mg total) by mouth Every evening    fluticasone propionate  (FLONASE) 50 mcg/actuation Nasal Spray, Suspension Administer 2 Sprays into each nostril Once a day    furosemide (LASIX) 40 mg Oral Tablet TAKE 1 TABLET EVERY DAY    HYDROcodone-acetaminophen (NORCO) 10-325 mg Oral Tablet Take 1 Tablet by mouth Every 8 hours as needed for Pain Reuben Likes with [facility]    Ibuprofen (MOTRIN) 800 mg Oral Tablet Take 1 Tablet (800 mg total) by mouth Three times a day as needed for Pain    Lancets (ACCU-CHEK SOFTCLIX LANCETS) Misc USE AS DIRECTED DAILY    levothyroxine (SYNTHROID) 25 mcg Oral Tablet TAKE 1 TABLET ONE TIME DAILY    metoprolol succinate (TOPROL-XL) 25 mg Oral Tablet Sustained Release 24 hr Take 0.5 Tablets (12.5 mg total) by mouth Once a day    NARCAN 4 mg/actuation Nasal Spray, Non-Aerosol PT STATED SHE HAS NARCAN AT HOME BUT IS NOT TAKING IT    Needle, Disp, 25 G 25 gauge x 1" Needle 1 mL Every 30 days    niacin (NIASPAN) 500 mg Oral Tablet Sustained Release 24 hr TAKE 1 TABLET EVERY DAY    nitroGLYCERIN (NITROSTAT) 0.4 mg Sublingual Tablet, Sublingual Place 1 Tablet (0.4 mg total) under the tongue Every 5 minutes as needed for Chest pain for 3 doses over 15 minutes    omeprazole (PRILOSEC) 40 mg Oral Capsule, Delayed Release(E.C.) TAKE 1 CAPSULE EVERY DAY    ondansetron (ZOFRAN ODT) 8 mg Oral Tablet, Rapid Dissolve DISSOLVE 1 TABLET ON THE TONGUE EVERY 8 HOURS AS NEEDED FOR NAUSEA AND VOMITING    OZEMPIC 2 mg/dose (8 mg/3 mL) Subcutaneous Pen Injector Inject 2 mg under the skin Every 7 days    pioglitazone (ACTOS) 15 mg Oral Tablet Take 1 Tablet (15 mg total) by mouth Once a day    PRENATAL PLUS, CALCIUM CARB, 27 mg iron-  1 mg Oral Tablet Take 1 Tablet by mouth Once a day    rosuvastatin (CRESTOR) 40 mg Oral Tablet Take 1 Tablet (40 mg total) by mouth Once a day TAKE 1 TABLET BY MOUTH ONCE DAILY    venlafaxine (EFFEXOR XR) 37.5 mg Oral Capsule, Sust. Release 24 hr TAKE 1 CAPSULE ONE TIME DAILY    ZYRTEC-D 5-120 mg Oral Tablet Sustained Release 12  hr Take 1 Tablet by mouth Once a day     Allergies   Allergen Reactions    Capron Dm [Pyrilamine-Dextromethorphan]  Other Adverse Reaction (Add comment)     LIGHT HEADED    Other      Coban dressing caused skin break down    Tylenol W Codeine [Acetaminophen-Codeine]  Other Adverse Reaction (Add comment)     pt states that one time she had chest pains with this med, but dr. thought it was because she had not eaten     Family Medical History:       Problem Relation (Age of Onset)    Atrial fibrillation Sister    Colon Cancer Father    Diabetes Mother, Maternal Aunt    Heart Disease Sister, Maternal Grandfather    Hypertension (High Blood Pressure) Mother    Lung Cancer Maternal Grandfather    MI <14 years of age Mother            Social History     Tobacco Use    Smoking status: Every Day     Current packs/day: 0.50     Average packs/day: 0.5 packs/day for 43.4 years (21.7 ttl pk-yrs)     Types: Cigarettes     Start date: 53    Smokeless tobacco: Never   Vaping Use    Vaping status: Never Used   Substance Use Topics    Alcohol use: No    Drug use: No     BASIC METABOLIC PANEL  Lab Results   Component Value Date    SODIUM 141 03/24/2023    POTASSIUM 4.0 03/24/2023    CHLORIDE 111 03/24/2023    CO2 22 03/24/2023    ANIONGAP 8 03/24/2023    BUN 10 03/24/2023    CREATININE 0.82 03/24/2023    BUNCRRATIO 12 03/24/2023    GFR 83 03/24/2023    CALCIUM 8.5 (L) 03/24/2023    GLUCOSENF 177 (H) 05/08/2021        Hepatic Function  Lab Results   Component Value Date    ALBUMIN 2.9 (L) 03/24/2023    TOTALPROTEIN 5.7 (L) 03/24/2023    ALKPHOS 86 03/24/2023    PROTHROMTME 10.0 03/24/2023    INR 0.84 03/24/2023    AST 25 03/24/2023    ALT 16 03/24/2023    BILIRUBINCON 0.1 03/24/2023         Lab Results   Component Value Date    HA1C 10.9 (H) 01/30/2023     Review of Systems  Review of Systems   Constitutional: Negative.  Negative for chills, fever, malaise/fatigue and weight loss.   HENT:  Positive for congestion, ear pain and sinus  pain.    Eyes: Negative.  Negative for double vision, discharge and redness.   Respiratory:  Positive for cough. Negative for hemoptysis and shortness of breath.    Cardiovascular: Negative.  Negative for chest pain, palpitations and leg swelling.   Gastrointestinal: Negative.  Negative for abdominal pain, diarrhea, nausea and vomiting.   Genitourinary: Negative.    Musculoskeletal: Negative.    Skin:  Negative.  Negative for rash.   Neurological: Negative.        Physical Exam:  BP 118/78   Pulse 84   Temp 36.6 C (97.9 F)   Resp 18   Ht 1.676 m (5\' 6" )   Wt 93 kg (205 lb)   LMP  (LMP Unknown)   SpO2 96%   BMI 33.09 kg/m       Physical Exam  Vitals and nursing note reviewed.   Constitutional:       General: She is not in acute distress.     Appearance: She is not toxic-appearing.   HENT:      Head: Normocephalic and atraumatic.      Right Ear: Tympanic membrane, ear canal and external ear normal.      Left Ear: Tympanic membrane, ear canal and external ear normal.      Nose: Congestion present.      Right Sinus: Maxillary sinus tenderness and frontal sinus tenderness present.      Left Sinus: Maxillary sinus tenderness and frontal sinus tenderness present.      Mouth/Throat:      Mouth: Mucous membranes are moist.      Pharynx: Oropharynx is clear.   Eyes:      Conjunctiva/sclera: Conjunctivae normal.   Cardiovascular:      Rate and Rhythm: Normal rate and regular rhythm.      Heart sounds: Normal heart sounds.   Pulmonary:      Effort: Pulmonary effort is normal. No tachypnea, bradypnea, accessory muscle usage or respiratory distress.      Breath sounds: Normal breath sounds.   Abdominal:      General: Bowel sounds are normal. There is no distension.      Palpations: Abdomen is soft. There is no mass.      Tenderness: There is no abdominal tenderness. There is no guarding or rebound.   Musculoskeletal:      Cervical back: Neck supple.   Lymphadenopathy:      Cervical: No cervical adenopathy.   Skin:      General: Skin is warm and dry.      Coloration: Skin is not cyanotic, jaundiced or pale.   Neurological:      Mental Status: She is alert and oriented to person, place, and time.   Psychiatric:         Mood and Affect: Affect normal.         Judgment: Judgment normal.         Assessment:    Sinusitis, unspecified chronicity, unspecified location    Cough, unspecified type        Plan:    Patient Instructions   Follow up immediately for new or worsening symptoms. Follow up with PCP if symptoms persist. Tylenol for pain and fever. Follow up if not improving after 48 to 72 hours.       Orders Placed This Encounter    cefdinir (OMNICEF) 300 mg Oral Capsule    benzonatate (TESSALON) 100 mg Oral Capsule       Follow up: No follow-ups on file.    Follow up with PCP.  Seek medical attention for new or worsening symptoms.    This note was partially created using MModal Fluency Direct system (voice recognition software) and is inherently subject to errors including those of syntax and "sound-alike" substitutions which may escape proofreading.  In such instances, original meaning may be extrapolated by contextual derivation.     I saw the patient  independently.            Jeani Sow, FNP  05/03/2023, 16:19

## 2023-05-03 NOTE — Patient Instructions (Signed)
Follow up immediately for new or worsening symptoms. Follow up with PCP if symptoms persist. Tylenol for pain and fever. Follow up if not improving after 48 to 72 hours.

## 2023-05-05 ENCOUNTER — Encounter (HOSPITAL_COMMUNITY): Payer: Self-pay

## 2023-05-05 NOTE — Nursing Note (Signed)
Multiple attempts made to call and schedule LDCT with no response. Mailed letter to pt. Notified J. [place] LPN via [employer]    Baseline  Auth: not needed- [employer]   Age: 57

## 2023-05-06 ENCOUNTER — Other Ambulatory Visit: Payer: Commercial Managed Care - PPO

## 2023-05-06 ENCOUNTER — Other Ambulatory Visit (INDEPENDENT_AMBULATORY_CARE_PROVIDER_SITE_OTHER): Payer: Self-pay | Admitting: Family Medicine

## 2023-05-06 ENCOUNTER — Other Ambulatory Visit: Payer: Self-pay

## 2023-05-06 DIAGNOSIS — E1165 Type 2 diabetes mellitus with hyperglycemia: Secondary | ICD-10-CM | POA: Insufficient documentation

## 2023-05-06 DIAGNOSIS — R109 Unspecified abdominal pain: Secondary | ICD-10-CM

## 2023-05-06 DIAGNOSIS — E1129 Type 2 diabetes mellitus with other diabetic kidney complication: Secondary | ICD-10-CM | POA: Insufficient documentation

## 2023-05-06 DIAGNOSIS — E785 Hyperlipidemia, unspecified: Secondary | ICD-10-CM | POA: Insufficient documentation

## 2023-05-06 LAB — THYROID STIMULATING HORMONE (SENSITIVE TSH): TSH: 1.637 u[IU]/mL (ref 0.350–4.940)

## 2023-05-06 LAB — LIPID PANEL
CHOL/HDL RATIO: 5.2
CHOLESTEROL: 209 mg/dL — ABNORMAL HIGH (ref 100–200)
HDL CHOL: 40 mg/dL — ABNORMAL LOW (ref >=50)
LDL CALC: 140 mg/dL — ABNORMAL HIGH (ref <100)
NON-HDL: 169 mg/dL (ref <=190)
TRIGLYCERIDES: 161 mg/dL — ABNORMAL HIGH (ref <150)
VLDL CALC: 29 mg/dL (ref <30)

## 2023-05-06 LAB — MICROALBUMIN/CREATININE RATIO, URINE, RANDOM
CREATININE RANDOM URINE: 66 mg/dL
MICROALBUMIN RANDOM URINE: <0.5 mg/dL

## 2023-05-06 LAB — LIPASE: LIPASE: 25 U/L (ref 10–60)

## 2023-05-06 NOTE — Nursing Note (Signed)
Pt called and requested labs from visit on 04/09/23.    Camila Li, [place]

## 2023-05-07 LAB — HGA1C (HEMOGLOBIN A1C WITH EST AVG GLUCOSE): HEMOGLOBIN A1C: 7.6 % — ABNORMAL HIGH (ref <5.7)

## 2023-05-08 NOTE — Result Encounter Note (Signed)
Please inform the patient that her labs are back.      Her lipid panel shows some improvement over 3 months ago.      Her thyroid levels were normal.      Her pancreas levels were normal as well.      Her urine showed she is not spilling significant amounts of protein.      At this point I am making no changes to her plan of care.  Will review her cholesterol levels together at our follow-up appointment.      TY TH

## 2023-05-19 ENCOUNTER — Encounter (INDEPENDENT_AMBULATORY_CARE_PROVIDER_SITE_OTHER): Payer: Self-pay

## 2023-05-25 MED ORDER — RIFAXIMIN 550 MG PO TABS: 550.0000 mg | ORAL_TABLET | Freq: Three times a day (TID) | ORAL | 0 refills | Status: DC

## 2023-05-25 NOTE — Telephone Encounter (Signed)
I sent in Xifaxan to pharmacy, but it may not be covered. Do we have samples #42 we could give patient? If so, please collect and put in bag to take 1 capsule TID for 14 days (42 total capsules).

## 2023-05-26 NOTE — Telephone Encounter (Signed)
There are no samples so I called [facility] and the pt has not been to see them but I advised them to please call the pt and to get her in to be evaluated. PA being done on Cover My Meds for Xifaxan 550mg  tab

## 2023-05-26 NOTE — Telephone Encounter (Signed)
PA done for Xifaxan 550mg . Tried and failed: Questran, Creon and Zenpep. Sent pt's last ov note and Ct scan to Cover My Meds. Waiting on a response.

## 2023-06-02 NOTE — Telephone Encounter (Signed)
Xifaxan denied 

## 2023-06-09 NOTE — Telephone Encounter (Signed)
Ann [name]: this is the patient who needs Xifaxan 550 mg TID for 14 days, disp#42 samples   Thank you!

## 2023-06-12 ENCOUNTER — Other Ambulatory Visit (INDEPENDENT_AMBULATORY_CARE_PROVIDER_SITE_OTHER): Payer: Self-pay | Admitting: Family Medicine

## 2023-06-12 DIAGNOSIS — E559 Vitamin D deficiency, unspecified: Secondary | ICD-10-CM

## 2023-06-15 NOTE — Telephone Encounter (Signed)
Last scheduled appointment with you was 04/09/2023.  Currently scheduled future appointment is 10/08/2023.      Egbert Garibaldi, LPN  12/13/1094, 04:54

## 2023-07-07 ENCOUNTER — Telehealth (INDEPENDENT_AMBULATORY_CARE_PROVIDER_SITE_OTHER): Payer: Self-pay | Admitting: Family Medicine

## 2023-07-07 DIAGNOSIS — E1100 Type 2 diabetes mellitus with hyperosmolarity without nonketotic hyperglycemic-hyperosmolar coma (NKHHC): Secondary | ICD-10-CM

## 2023-07-07 NOTE — Telephone Encounter (Signed)
Patient requesting referral to podiatry d/t being diabetic.    Last ov: 04/09/2023  Next ov: 10/08/2023    Monique [name]

## 2023-07-09 NOTE — Telephone Encounter (Signed)
Referral entered.        , LPN

## 2023-07-11 ENCOUNTER — Other Ambulatory Visit: Payer: Self-pay

## 2023-07-11 ENCOUNTER — Ambulatory Visit (INDEPENDENT_AMBULATORY_CARE_PROVIDER_SITE_OTHER): Payer: Commercial Managed Care - PPO | Admitting: NURSE PRACTITIONER

## 2023-07-11 ENCOUNTER — Encounter (INDEPENDENT_AMBULATORY_CARE_PROVIDER_SITE_OTHER): Payer: Commercial Managed Care - PPO | Admitting: Radiology

## 2023-07-11 ENCOUNTER — Encounter (INDEPENDENT_AMBULATORY_CARE_PROVIDER_SITE_OTHER): Payer: Self-pay

## 2023-07-11 VITALS — BP 130/80 | HR 76 | Temp 98.0°F | Resp 18 | Ht 67.0 in | Wt 211.0 lb

## 2023-07-11 DIAGNOSIS — S6992XA Unspecified injury of left wrist, hand and finger(s), initial encounter: Secondary | ICD-10-CM

## 2023-07-11 DIAGNOSIS — S8991XA Unspecified injury of right lower leg, initial encounter: Secondary | ICD-10-CM

## 2023-07-11 DIAGNOSIS — M25551 Pain in right hip: Secondary | ICD-10-CM

## 2023-07-11 DIAGNOSIS — S80211A Abrasion, right knee, initial encounter: Secondary | ICD-10-CM

## 2023-07-11 MED ORDER — MUPIROCIN 2 % TOPICAL OINTMENT
TOPICAL_OINTMENT | Freq: Two times a day (BID) | CUTANEOUS | 0 refills | Status: AC
Start: 2023-07-11 — End: 2023-07-21

## 2023-07-11 NOTE — Progress Notes (Signed)
[address] [facility]  [address]  [place] [address]    Progress Note    Name: Monique [name] MRN:  [id number]   Date: 07/11/2023 DOB:  [birth year] (56 y.o.)             Reason for Visit: Fall Larey Seat over a water hose yesterday, fell on knees and bent left hand backwards. C/o pain in the right hip and knee with abrasions to right knee. Left wrist is painful after hand was bent backwards in the fall)    Nursing Notes:  There are no exam notes on file for this visit.    History of Present Illness  Monique [name] is a 57 y.o. female who is being seen today for above symptoms. Patient reports she fell over a water hose and landed on her knees and tried to catch herself with her left hand.  She reports the left knee is no longer painful but pain in the right knee has persisted and she is also having pain in the right hip.   Patient denies any head/neck/back impact and there was no LOC.   Patient reports she had tetanus vaccine 3 years ago.   Past Medical History:   Diagnosis Date    Anxiety     Arthritis     Back problem     DDD    Beta blocker prescribed for left ventricular systolic dysfunction     Blood thinned due to long-term anticoagulant use     Carotid stenosis     occlusion of left ICA    Chest pain 04/03/2018    Chronic back pain     Chronic obstructive pulmonary disease, unspecified COPD type (CMS HCC) 12/31/2022    Coronary artery disease involving native coronary artery of native heart     DDD (degenerative disc disease), lumbar     "entire spine"    Ear piercing     GERD (gastroesophageal reflux disease)     controlled with omeprazole    H/O complete eye exam     2 years, Dr. Thompson Caul, at [facility]    History of dental examination     dentures upper and lower    HTN     Hx of coronary artery bypass graft     2012    Hx of gastric bypass     Hypercholesterolemia     Hyperlipidemia     Hypothyroid     MI (myocardial infarction) (CMS HCC) 2012    Neck problem     herniated cervical disc     Obesity (BMI 30-39.9)     Peripheral vascular disease, unspecified (CMS HCC) 01/03/2021    Solitary nodule of right lobe of thyroid 03/30/2018    Incidental finding on MRI thoracic spine. [place] ordered.     Stented coronary artery     x4    Stroke (CMS [facility])     2002    Tattoo     Left breast, Right shoulder    Thyroid disorder     Thyroid nodule     Type 2 diabetes mellitus (CMS HCC)     HGA1C 10.4 on 12/19/21    Uncontrolled type 2 diabetes mellitus, without long-term current use of insulin     Xanthelasma          Past Surgical History:   Procedure Laterality Date    ENDOMETRIAL ABLATION  1998    HX ANKLE FRACTURE TX  1990s    Right,  [name] procedure    HX CATARACT REMOVAL Bilateral 2013    r and l eyes with implants    HX CESAREAN SECTION  06/20/2000    x3, 11/21/86, 11/10/84    HX CHOLECYSTECTOMY  1988    HX CORONARY ARTERY BYPASS GRAFT  03/31/2011    cardiac, 2 vessel, Dr. Francesco Runner, [facility]    HX CORONARY STENT PLACEMENT  2012    x4 stents    HX GASTRIC SLEEVE  02/2017    [place], [place], Dr. Devin Going HEART CATHETERIZATION  2012    massive heart attack and stents - [facility]    HX THYROID BIOPSY      HX THYROIDECTOMY  101/01/2018    HX TONSILLECTOMY      as child    HX YAG Left 09/20/2013         Current Outpatient Medications   Medication Sig    aspirin (ECOTRIN) 81 mg Oral Tablet, Delayed Release (E.C.) Take 1 Tablet (81 mg total) by mouth Once a day    benzonatate (TESSALON) 100 mg Oral Capsule Take 1 Capsule (100 mg total) by mouth Three times a day as needed for Cough (Patient not taking: Reported on 07/11/2023)    Blood Sugar Diagnostic (ACCU-CHEK GUIDE TEST STRIPS) Does not apply Strip TEST BLOOD SUGAR THREE TIMES DAILY    Blood-Glucose Meter (ACCU-CHEK GUIDE GLUCOSE METER) Misc USE AS DIRECTED    clopidogreL (PLAVIX) 75 mg Oral Tablet TAKE 1 TABLET EVERY DAY    cyanocobalamin (VITAMIN B12) 1,000 mcg/mL Injection Solution INJECT 1 ML (1,000 MCG TOTAL) UNDER THE SKIN EVERY 30 DAYS    docusate sodium  (COLACE) 100 mg Oral Capsule TAKE 1 CAPSULE TWICE DAILY    empagliflozin (JARDIANCE) 10 mg Oral Tablet Take 1 Tablet (10 mg total) by mouth Once a day    ergocalciferol, vitamin D2, (DRISDOL) 1,250 mcg (50,000 unit) Oral Capsule TAKE 1 CAPSULE BY MOUTH EVERY WEEK    ezetimibe (ZETIA) 10 mg Oral Tablet Take 1 Tablet (10 mg total) by mouth Every evening    fluticasone propionate (FLONASE) 50 mcg/actuation Nasal Spray, Suspension Administer 2 Sprays into each nostril Once a day    furosemide (LASIX) 40 mg Oral Tablet TAKE 1 TABLET EVERY DAY    HYDROcodone-acetaminophen (NORCO) 10-325 mg Oral Tablet Take 1 Tablet by mouth Every 8 hours as needed for Pain Reuben Likes with [facility]    Ibuprofen (MOTRIN) 800 mg Oral Tablet Take 1 Tablet (800 mg total) by mouth Three times a day as needed for Pain    Lancets (ACCU-CHEK SOFTCLIX LANCETS) Misc USE AS DIRECTED DAILY    levothyroxine (SYNTHROID) 25 mcg Oral Tablet TAKE 1 TABLET ONE TIME DAILY    metoprolol succinate (TOPROL-XL) 25 mg Oral Tablet Sustained Release 24 hr Take 0.5 Tablets (12.5 mg total) by mouth Once a day    mupirocin (BACTROBAN) 2 % Ointment Apply topically Twice daily for 10 days    NARCAN 4 mg/actuation Nasal Spray, Non-Aerosol PT STATED SHE HAS NARCAN AT HOME BUT IS NOT TAKING IT    Needle, Disp, 25 G 25 gauge x 1" Needle 1 mL Every 30 days    niacin (NIASPAN) 500 mg Oral Tablet Sustained Release 24 hr TAKE 1 TABLET EVERY DAY    nitroGLYCERIN (NITROSTAT) 0.4 mg Sublingual Tablet, Sublingual Place 1 Tablet (0.4 mg total) under the tongue Every 5 minutes as needed for Chest pain for 3 doses over 15 minutes  omeprazole (PRILOSEC) 40 mg Oral Capsule, Delayed Release(E.C.) TAKE 1 CAPSULE EVERY DAY    ondansetron (ZOFRAN ODT) 8 mg Oral Tablet, Rapid Dissolve DISSOLVE 1 TABLET ON THE TONGUE EVERY 8 HOURS AS NEEDED FOR NAUSEA AND VOMITING    OZEMPIC 2 mg/dose (8 mg/3 mL) Subcutaneous Pen Injector Inject 2 mg under the skin Every 7 days     pioglitazone (ACTOS) 15 mg Oral Tablet Take 1 Tablet (15 mg total) by mouth Once a day    PRENATAL VITAMIN PLUS LOW IRON 27 mg iron- 1 mg Oral Tablet TAKE 1 TABLET EVERY DAY    rosuvastatin (CRESTOR) 40 mg Oral Tablet Take 1 Tablet (40 mg total) by mouth Once a day TAKE 1 TABLET BY MOUTH ONCE DAILY    venlafaxine (EFFEXOR XR) 37.5 mg Oral Capsule, Sust. Release 24 hr TAKE 1 CAPSULE ONE TIME DAILY    ZYRTEC-D 5-120 mg Oral Tablet Sustained Release 12 hr Take 1 Tablet by mouth Once a day     Allergies   Allergen Reactions    Capron Dm [Pyrilamine-Dextromethorphan]  Other Adverse Reaction (Add comment)     LIGHT HEADED    Other      Coban dressing caused skin break down    Tylenol W Codeine [Acetaminophen-Codeine]  Other Adverse Reaction (Add comment)     pt states that one time she had chest pains with this med, but dr. thought it was because she had not eaten     Family Medical History:       Problem Relation (Age of Onset)    Atrial fibrillation Sister    Colon Cancer Father    Diabetes Mother, Maternal Aunt    Heart Disease Sister, Maternal Grandfather    Hypertension (High Blood Pressure) Mother    Lung Cancer Maternal Grandfather    MI <58 years of age Mother            Social History     Tobacco Use    Smoking status: Every Day     Current packs/day: 0.50     Average packs/day: 0.5 packs/day for 43.6 years (21.8 ttl pk-yrs)     Types: Cigarettes     Start date: 69    Smokeless tobacco: Never   Vaping Use    Vaping status: Never Used   Substance Use Topics    Alcohol use: No    Drug use: No       Review of Systems  Review of Systems   Constitutional:  Negative for chills, fever and malaise/fatigue.   Eyes:  Negative for blurred vision and double vision.   Respiratory:  Negative for cough and shortness of breath.    Cardiovascular:  Negative for chest pain.   Gastrointestinal:  Negative for abdominal pain, nausea and vomiting.   Musculoskeletal:  Negative for back pain and neck pain.        See HPI   Skin:          abrasion present to right knee   Neurological:  Negative for dizziness, sensory change, speech change, focal weakness, seizures, loss of consciousness, weakness and headaches.   Psychiatric/Behavioral:  Negative for memory loss. The patient is not nervous/anxious.        Physical Exam:  BP 130/80   Pulse 76   Temp 36.7 C (98 F)   Resp 18   Ht 1.702 m (5\' 7" )   Wt 95.7 kg (211 lb)   LMP  (LMP Unknown)   SpO2 95%  BMI 33.05 kg/m       Physical Exam  Cardiovascular:      Rate and Rhythm: Regular rhythm.   Pulmonary:      Breath sounds: Normal breath sounds.   Musculoskeletal:      Right wrist: Normal.      Left wrist: Tenderness and bony tenderness present. No swelling, deformity or snuff box tenderness. Normal pulse.      Right hand: No swelling, tenderness or bony tenderness. Normal capillary refill.      Left hand: No tenderness or bony tenderness. Normal capillary refill.      Right knee: No swelling or ecchymosis. Tenderness present over the medial joint line and lateral joint line. No ACL laxity or PCL laxity.      Instability Tests: Anterior drawer test negative. Posterior drawer test negative.      Left knee: No swelling, ecchymosis or bony tenderness. No tenderness.      Comments: Right greater trochanter tender to palpation.  Right knee has joint line tenderness and shallow abrasion present.  Left wrist is tender central aspect, no distal radius/ulnar tenderness  Sensation intact.  Right calf is 14 inches  Left calf is 14.5 inches   Skin:     General: Skin is warm and dry.   Neurological:      General: No focal deficit present.      Mental Status: She is alert.      Cranial Nerves: No cranial nerve deficit.      Sensory: No sensory deficit.   Psychiatric:         Behavior: Behavior normal.         Assessment:    Injury of right knee, initial encounter    Injury of left wrist, initial encounter    Right hip pain    Abrasion, right knee, initial encounter       Plan:  Patient advised to monitor  abrasion for any signs of infection and notify clinic if this occurs.   Xray results explained to patient.  Patient Instructions   Abrasion right knee  Cleanse affected area with saline twice daily and apply Bactroban ointment as ordered.   Injuries to right knee/hip and left wrist.  Call Monday 07/13/2023 and schedule an appointment with a provider at your PCP's office.  You can take over the counter Tylenol as directed on product label.   Rest  You can notify this clinic if symptoms not improving next 5 days.     XR KNEE RIGHT 4 OR MORE VIEWS    Result Date: 07/11/2023  EXAMINATION: Right knee 4 views HISTORY: Larey Seat one day earlier with right knee injury. Frontal, lateral, and oblique views of the right knee were obtained. Comparison made to an exam dated 09/28/2022. There is mild skeletal demineralization. Basilar calcifications are noted. Osseous alignment is anatomic. No acute fracture is identified. Joint spaces are fairly well-maintained with only minimal degenerative changes noted about the medial compartment.     1. Mild osteopenia without acute bony injury. 2. Mild degenerative change about the knee, most pronounced about the medial femorotibial compartment. Radiologist location ID: [id number]     XR HIP RIGHT W PELVIS 2-3 VIEWS    Result Date: 07/11/2023  EXAMINATION: Right hip with pelvis HISTORY: Larey Seat one day earlier. Right hip injury/pain. AP view of the pelvis was performed along with frog-leg lateral view of the right hip. Comparison made to an exam dated 02/17/2023. There is mild generalized osteopenia. Hip joints are fairly well-maintained  with only minimal degenerative change noted on the right. No acute fracture is seen. Overall there has been little change. Vascular calcifications are noted.     1. Mild osteopenia without acute bony injury. Radiologist location ID: [id number]     XR WRIST LEFT    Result Date: 07/11/2023  EXAMINATION: Left wrist HISTORY: Larey Seat one day earlier with left wrist injury.  Four views of the left wrist were obtained, including a scaphoid view. Bones are osteopenic. No acute fracture is identified. No destructive bony lesions are seen. There is no significant arthritic process. The scaphoid is intact.     1. Osteopenia without acute bony injury. Radiologist location ID: [id number]      Orders Placed This Encounter    XR HIP RIGHT W PELVIS 2-3 VIEWS    XR KNEE RIGHT 4 OR MORE VIEWS    XR WRIST LEFT    mupirocin (BACTROBAN) 2 % Ointment        MDM:      During the patient's visit at urgent care patient's HPI, ROS and physical exam were completed to assist in medical decision making.    Medications were reviewed and reconciled. Medication instructions and side effects were discussed.  Advised to seek immediate medical care with any new, worsening, or concerning symptoms.  Opportunity to ask questions was provided and all questions were answered.  Discussed diagnosis and management including indications for return, importance of close follow up and supportive care measures prior to patient discharge.       Antony Haste, FNP-BC  07/11/2023, 19:08

## 2023-07-11 NOTE — Patient Instructions (Addendum)
Abrasion right knee  Cleanse affected area with saline twice daily and apply Bactroban ointment as ordered.   Injuries to right knee/hip and left wrist.  Call Monday 07/13/2023 and schedule an appointment with a provider at your PCP's office.  You can take over the counter Tylenol as directed on product label.   Rest  You can notify this clinic if symptoms not improving next 5 days.

## 2023-07-12 ENCOUNTER — Encounter (INDEPENDENT_AMBULATORY_CARE_PROVIDER_SITE_OTHER): Payer: Self-pay | Admitting: NURSE PRACTITIONER

## 2023-07-21 ENCOUNTER — Ambulatory Visit (INDEPENDENT_AMBULATORY_CARE_PROVIDER_SITE_OTHER): Payer: Self-pay

## 2023-07-23 ENCOUNTER — Encounter: Payer: Self-pay | Admitting: Gastroenterology

## 2023-07-23 ENCOUNTER — Ambulatory Visit (INDEPENDENT_AMBULATORY_CARE_PROVIDER_SITE_OTHER): Payer: 59 | Admitting: Gastroenterology

## 2023-07-23 ENCOUNTER — Encounter (INDEPENDENT_AMBULATORY_CARE_PROVIDER_SITE_OTHER): Payer: Self-pay

## 2023-07-23 VITALS — BP 148/90 | HR 73 | Temp 97.5°F | Ht 66.0 in | Wt 151.3 lb

## 2023-07-23 DIAGNOSIS — K831 Obstruction of bile duct: Secondary | ICD-10-CM | POA: Diagnosis not present

## 2023-07-23 DIAGNOSIS — G8929 Other chronic pain: Secondary | ICD-10-CM | POA: Diagnosis not present

## 2023-07-23 DIAGNOSIS — R109 Unspecified abdominal pain: Secondary | ICD-10-CM

## 2023-07-23 NOTE — Progress Notes (Signed)
Gastroenterology Office Note     Primary Care Physician:  Richardean Chimera, MD  Primary Gastroenterologist: Dr. Marletta Lor    Chief Complaint   Chief Complaint  Patient presents with   Abdominal Pain    Patient here today to follow up on her Chronic abdominal pain. Patient states the condition is worsening. Patient is not currently taking anything other than tylenol prn for the pain.     History of Present Illness   Ann [name] is a 57 y.o. female presenting today with a history of chronic abdominal pain, chronic intermittent diarrhea, prior metal biliary stent placed in 2012 due to strictured CBD, multiple previous ERCPs outside facility after bile leak s/p cholecystectomy, last ERCP in July 2021 at [facility] with biliary stent pull/balloon sweep with stone removal, dilation. Has seen multiple GI practices. See below for prior evaluations.   Went to [facility]. States was told in pain due to abnormal lipid panel. In pain, feels like it is pancreatitis. Abdomen will swell up and she doubles over. Hasn't eaten today.   Recently has upper abdominal swelling on own without eating. Mouth sores. Lips chapped. Feels like has to have BM all the time but can't get anything out. Has looser stool if does have a BM. Constant nausea. Constantly itching. Having bowel accidents in public. A few months ago in [employer] and stool poured out. Can't control it. Severe pain with eating or drinking regardless. Hot and cold spells. Eating very small amounts. Feels like swells up with eating. Mid back pain. Muscle aches. Constant gurgling sounds. Smell in stool.    Upper abdomen pain is worst. Does not want to focus on lower GI symptoms currently. Sometimes has normal BMs. 90% of the time looser stool after eating. CTA with moderate to large formed stool burden. Started taking Imodium so wouldn't have accident in public. Taking one 2 mg imodium in the morning. No improvement in  pain after BM.   Negative celiac serologies in 2019.    Prior evaluation: Starting in 2010 after cholecystectomy: She had a bile leak s/p cholecystectomy, requiring ERCP with stent placement for biliary stricture. She reports multiple ERCPs at outside facility with multiple stents placed. She notes that each time stent would be removed, she would develop abdominal pain. She reports having "8-10 ERCPs". Liver biopsy by Dr. Milinda Antis 2011: non-specific findings. Looking back at LFTs, she has had bumps in transaminases from the upper 200s to 400/600, with bilirubin ranging from 1-3.  2013 had fluctuating transaminases as well but less severe. During hospitalization [facility] Dec 2015 for acute pancreatitis, peak AST 243, ALT 206, bilirubin normal at 1. A,lk phos 342, improving with supportive care. 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      ERCP in 2012 by Dr. Opal Sidles with moderate stricture of the distal CBD with moderate dilatation of CBD, common hepatic duct, and intrahepatic biliary tree. In Aug 2012, a fully covered metal wall stent (10X 40 mm) was placed. Brushings around 2012 negative for malignancy. When seen by Dr. Lanell Matar at [facility] in 2013, question of secondary sclerosing cholangitis. EGD/EUS March 2013 normal. Recommended stent removal at that time, but she declined due to fear that pain would recur. She was inpatient in [facility] and was assessed by Dr. Dulce Sellar in 2015 with abdominal pain and abnormal CT scan noting thickening of the head of pancreas with  non-pathologic sized peripancreatic adenopathy. Felt she may have acute on chronic pancreatitis at that time. She was to follow-up with Dr. Dulce Sellar Jan 2016 but did not do this. CA 19-9 low in 2015   [facility] July 2021. ERCP with biliary stent pull/balloon sweep with stones remove, dilated with 12 mm balloon.   Due to persistent  abdominal pain, Feb 2024  CTA negative for chronic mesenteric ischemia.   Celiac serologies negative.   EGD in Dec 2023: gastritis s/p biopsy, normal duodenum.   Declining TCAs. Stating this has caused sedation in the past.      Past Medical History:  Diagnosis Date   Anxiety    Arthritis    Chronic back pain    Complication of anesthesia    pt states, "i have migraine and high BP after surgery.".   Depression    GERD (gastroesophageal reflux disease)    Migraines    Pancreatitis     Past Surgical History:  Procedure Laterality Date   BILE DUCT STENT PLACEMENT  07/2011   Dr. Steffanie Dunn: fully covered metal wall stent placed.    BIOPSY  12/05/2022   Procedure: BIOPSY;  Surgeon: Lanelle Bal, DO;  [facility]: [facility];  Service: Endoscopy;;   CHOLECYSTECTOMY  2010   with bile leak   COLONOSCOPY  2011   [facility]   COLONOSCOPY WITH PROPOFOL N/A 03/02/2018   two 2-3 mm hyperplastic polyps, torturous left colon, mild diverticulosis in rectosigmoid, internal hemorrhoids, colonic biopsies negative   ERCP  2011-2013   multiple, with multiple stent placements/exchanges. Metal wall stent placed in Aug 2012 AND STILL PRESENT AS OF 2019   ESOPHAGOGASTRODUODENOSCOPY (EGD) WITH PROPOFOL N/A 03/02/2018   dysphagia due to benign-appearing esophageal stricture/GERD, s/p dilation, small hiatal hernia, gastritis.    ESOPHAGOGASTRODUODENOSCOPY (EGD) WITH PROPOFOL N/A 12/05/2022   gastritis, overall unrevealing.   SAVORY DILATION N/A 03/02/2018   Procedure: SAVORY DILATION;  Surgeon: West Bali, MD;  Location: [facility];  Service: Endoscopy;  Laterality: N/A;    Current Outpatient Medications  Medication Sig Dispense Refill   ALPRAZolam (XANAX) 1 MG tablet Take 0.5-1 mg by mouth 2 (two) times daily as needed for anxiety or sleep.  0   amphetamine-dextroamphetamine (ADDERALL) 20 MG tablet Take 20 mg by mouth every morning. As needed     RABEprazole (ACIPHEX) 20  MG tablet Take 1 tablet (20 mg total) by mouth 2 (two) times daily before a meal. 60 tablet 5   Rimegepant Sulfate (NURTEC) 75 MG TBDP Take by mouth daily at 6 (six) AM.     Turmeric (QC TUMERIC COMPLEX PO) Take by mouth daily at 6 (six) AM.     No current facility-administered medications for this visit.    Allergies as of 07/23/2023 - Review Complete 07/23/2023  Allergen Reaction Noted   Imitrex [sumatriptan] Anaphylaxis and Rash 06/15/2012   Cymbalta [duloxetine hcl] Diarrhea, Nausea Only, and Other (See Comments) 02/12/2016   Dicyclomine  03/02/2018   Doxycycline Diarrhea 06/15/2012   Gabapentin Other (See Comments) 02/17/2018   Lyrica [pregabalin] Swelling and Other (See Comments) 06/15/2012   Nucynta [tapentadol]  05/20/2015   Nuvigil [armodafinil] Other (See Comments) 02/12/2016   Other  06/06/2015   Penicillins Other (See Comments) 02/12/2016   Tape  07/24/2019   Amoxicillin-pot clavulanate Diarrhea and Rash 06/15/2012   Butrans [buprenorphine] Hives, Swelling, and Rash 06/15/2012   Ciprofloxacin Diarrhea, Nausea And Vomiting, and Other (See Comments) 02/12/2016    Family History  Problem  Relation Age of Onset   Cervical cancer Mother    Heart attack Other    Migraines Neg Hx    Seizures Neg Hx    Dementia Neg Hx    Colon cancer Neg Hx     Social History   Socioeconomic History   Marital status: Married    Spouse name: Onalee Hua   Number of children: 2   Years of education: 11   Highest education level: Not on file  Occupational History   Occupation: Workers comp  Tobacco Use   Smoking status: Former    Current packs/day: 0.00    Average packs/day: 0.3 packs/day for 10.0 years (2.5 ttl pk-yrs)    Types: Cigarettes    Start date: 12/08/2001    Quit date: 12/09/2011    Years since quitting: 11.6   Smokeless tobacco: Never  Vaping Use   Vaping status: Never Used  Substance and Sexual Activity   Alcohol use: No   Drug use: No   Sexual activity: Yes    Birth  control/protection: Post-menopausal  Other Topics Concern   Not on file  Social History Narrative   Lives w/ husband   Caffeine use:  none   Social Determinants of Health   Financial Resource Strain: Not on file  Food Insecurity: Not on file  Transportation Needs: Not on file  Physical Activity: Not on file  Stress: Not on file  Social Connections: Not on file  Intimate Partner Violence: Not on file     Review of Systems   Gen: Denies any fever, chills, fatigue, weight loss, lack of appetite.  CV: Denies chest pain, heart palpitations, peripheral edema, syncope.  Resp: Denies shortness of breath at rest or with exertion. Denies wheezing or cough.  GI: Denies dysphagia or odynophagia. Denies jaundice, hematemesis, fecal incontinence. GU : Denies urinary burning, urinary frequency, urinary hesitancy MS: Denies joint pain, muscle weakness, cramps, or limitation of movement.  Derm: Denies rash, itching, dry skin Psych: Denies depression, anxiety, memory loss, and confusion Heme: Denies bruising, bleeding, and enlarged lymph nodes.   Physical Exam   BP (!) 148/90 (BP Location: Left Arm, Patient Position: Sitting, Cuff Size: Normal)   Pulse 73   Temp (!) 97.5 F (36.4 C) (Temporal)   Ht 5\' 6"  (1.676 m)   Wt 151 lb 4.8 oz (68.6 kg)   LMP 05/16/2012   BMI 24.42 kg/m  General:   Alert and oriented. Pleasant and cooperative. Well-nourished and well-developed. Tearful at times.  Head:  Normocephalic and atraumatic. Eyes:  Without icterus Abdomen:  +BS, soft, TTP and non-distended. No HSM noted. No guarding or rebound. No masses appreciated.  Rectal:  Deferred  Msk:  Symmetrical without gross deformities. Normal posture. Extremities:  Without edema. Neurologic:  Alert and  oriented x4;  grossly normal neurologically. Skin:  Intact without significant lesions or rashes. Psych:  Alert and cooperative. Normal mood and affect.   Assessment   Ann [name] is a 57 y.o.  female presenting today with a history of chronic abdominal pain, chronic intermittent diarrhea, multiple prior ERCPs in past due to bile leak s/p cholecystectomy, prior biliary strictures with stent placement and removal of stones, multiple prior GI evaluations at other practices and extensive evaluation as noted above.  She continues to have worsening upper abdominal pain, bloating, not necessarily postprandial but definitely worsened s/p oral intake, early satiety. CTA negative for chronic mesenteric ischemia and EGD on file from Dec 2023. Multiple prior evaluations as noted in HPI.  We have referred to pain management but have had difficulty establishing care.   I also note that she likely has overflow incontinence and documented stool burden on prior imaging, but she wants to continue with her current regimen including Imodium due to her concern for incontinence episodes. This will only worsen underlying etiology; however, she is quite concerned about abdominal pain and wants to focus on this.  At this point, I feel she would be better served at tertiary facility. In interim, we will update MRI/MRCP to assess for any occult CBD stones or pancreatic etiologies due to her complicated history.       PLAN    Expedited MRI/MRCP Likely referral back to [facility] Limited options here locally   Gelene Mink, PhD, [facility] Gastroenterology

## 2023-07-23 NOTE — Patient Instructions (Signed)
I am ordering an MRCP.  I am also referring you back to [facility]. I feel you would benefit from that tertiary level of care!  I am sorry you are feeling so bad, and we will do what we can to help from our end.    Gelene Mink, PhD, ANP-BC [facility] Gastroenterology

## 2023-07-24 ENCOUNTER — Other Ambulatory Visit (HOSPITAL_COMMUNITY): Payer: Self-pay | Admitting: Gastroenterology

## 2023-07-24 DIAGNOSIS — R52 Pain, unspecified: Secondary | ICD-10-CM

## 2023-07-25 ENCOUNTER — Ambulatory Visit (HOSPITAL_COMMUNITY)
Admission: RE | Admit: 2023-07-25 | Discharge: 2023-07-25 | Disposition: A | Discharge status: Home / Self Care | Payer: 59 | Source: Ambulatory Visit | Attending: Gastroenterology | Admitting: Gastroenterology

## 2023-07-25 DIAGNOSIS — K831 Obstruction of bile duct: Secondary | ICD-10-CM | POA: Diagnosis present

## 2023-07-25 DIAGNOSIS — R52 Pain, unspecified: Secondary | ICD-10-CM

## 2023-07-25 DIAGNOSIS — R109 Unspecified abdominal pain: Secondary | ICD-10-CM | POA: Insufficient documentation

## 2023-07-25 DIAGNOSIS — G8929 Other chronic pain: Secondary | ICD-10-CM

## 2023-07-25 MED ORDER — GADOBUTROL 1 MMOL/ML IV SOLN
7.0000 mL | Freq: Once | INTRAVENOUS | Status: AC | PRN
  Administered 2023-07-25: 7 mL via INTRAVENOUS

## 2023-07-25 MED ORDER — GADOBUTROL 1 MMOL/ML IV SOLN: 7.0000 mL | Freq: Once | INTRAVENOUS | Status: DC | PRN

## 2023-07-26 ENCOUNTER — Other Ambulatory Visit: Payer: Self-pay

## 2023-07-26 ENCOUNTER — Encounter (INDEPENDENT_AMBULATORY_CARE_PROVIDER_SITE_OTHER): Payer: Self-pay

## 2023-07-26 ENCOUNTER — Ambulatory Visit (INDEPENDENT_AMBULATORY_CARE_PROVIDER_SITE_OTHER): Payer: Commercial Managed Care - PPO | Admitting: NURSE PRACTITIONER

## 2023-07-26 ENCOUNTER — Other Ambulatory Visit: Payer: Commercial Managed Care - PPO | Attending: NURSE PRACTITIONER | Admitting: NURSE PRACTITIONER

## 2023-07-26 VITALS — BP 118/80 | HR 93 | Temp 96.2°F | Resp 18 | Ht 66.0 in | Wt 212.1 lb

## 2023-07-26 DIAGNOSIS — J329 Chronic sinusitis, unspecified: Secondary | ICD-10-CM | POA: Insufficient documentation

## 2023-07-26 DIAGNOSIS — R519 Headache, unspecified: Secondary | ICD-10-CM

## 2023-07-26 DIAGNOSIS — R52 Pain, unspecified: Secondary | ICD-10-CM

## 2023-07-26 LAB — COVID-19 ~~LOC~~ MOLECULAR LAB TESTING: SARS-CoV-2: NOT DETECTED

## 2023-07-26 MED ORDER — AMOXICILLIN 500 MG-POTASSIUM CLAVULANATE 125 MG TABLET
1.0000 | ORAL_TABLET | Freq: Three times a day (TID) | ORAL | 0 refills | Status: AC
Start: 2023-07-26 — End: 2023-08-02

## 2023-07-26 NOTE — Patient Instructions (Signed)
We will call with the test results  Self quarantine until COVID test results received  Follow up immediately for new or worsening symptoms. Follow up with PCP if symptoms persist. Tylenol for pain and fever. Follow up if not improving after 48 to 72 hours.

## 2023-07-26 NOTE — Progress Notes (Addendum)
URGENT CARE, [facility]  [address]  [place] [address]    Progress Note    Name: Monique [name] MRN:  [id number]   Date: 07/26/2023 DOB:  Jun 17, 1966 (57 y.o.)             Reason for Visit: Headache (Onset Thursday with headaches, runny nose, body aches. )    History of Present Illness  Monique [name] is a 57 y.o. female who is being seen today for The above stated symptoms, denies alleviating factors.  Patient points to frontal sinuses area headache.  Also having some laryngitis.  Symptoms started Thursday.      Nursing Notes:  There are no exam notes on file for this visit.    Past Medical History:   Diagnosis Date    Anxiety     Arthritis     Back problem     DDD    Beta blocker prescribed for left ventricular systolic dysfunction     Blood thinned due to long-term anticoagulant use     Carotid stenosis     occlusion of left ICA    Chest pain 04/03/2018    Chronic back pain     Chronic obstructive pulmonary disease, unspecified COPD type (CMS HCC) 12/31/2022    Coronary artery disease involving native coronary artery of native heart     DDD (degenerative disc disease), lumbar     "entire spine"    Ear piercing     GERD (gastroesophageal reflux disease)     controlled with omeprazole    H/O complete eye exam     2 years, Dr. Thompson Caul, at [facility]    History of dental examination     dentures upper and lower    HTN     Hx of coronary artery bypass graft     2012    Hx of gastric bypass     Hypercholesterolemia     Hyperlipidemia     Hypothyroid     MI (myocardial infarction) (CMS HCC) 2012    Neck problem     herniated cervical disc    Obesity (BMI 30-39.9)     Peripheral vascular disease, unspecified (CMS HCC) 01/03/2021    Solitary nodule of right lobe of thyroid 03/30/2018    Incidental finding on MRI thoracic spine. [place] ordered.     Stented coronary artery     x4    Stroke (CMS [facility])     2002    Tattoo     Left breast, Right shoulder    Thyroid disorder     Thyroid nodule     Type 2  diabetes mellitus (CMS HCC)     HGA1C 10.4 on 12/19/21    Uncontrolled type 2 diabetes mellitus, without long-term current use of insulin     Xanthelasma          Past Surgical History:   Procedure Laterality Date    ENDOMETRIAL ABLATION  1998    HX ANKLE FRACTURE TX  1990s    Right, [name] procedure    HX CATARACT REMOVAL Bilateral 2013    r and l eyes with implants    HX CESAREAN SECTION  06/20/2000    x3, 11/21/86, 11/10/84    HX CHOLECYSTECTOMY  1988    HX CORONARY ARTERY BYPASS GRAFT  03/31/2011    cardiac, 2 vessel, Dr. Francesco Runner, [facility]    HX CORONARY STENT PLACEMENT  2012    x4 stents  HX GASTRIC SLEEVE  02/2017    [place], [place], Dr. Devin Going HEART CATHETERIZATION  2012    massive heart attack and stents - [facility]    HX THYROID BIOPSY      HX THYROIDECTOMY  101/01/2018    HX TONSILLECTOMY      as child    HX YAG Left 09/20/2013         Current Outpatient Medications   Medication Sig    amoxicillin-pot clavulanate (AUGMENTIN) 500-125 mg Oral Tablet Take 1 Tablet by mouth Three times a day for 7 days    aspirin (ECOTRIN) 81 mg Oral Tablet, Delayed Release (E.C.) Take 1 Tablet (81 mg total) by mouth Once a day    benzonatate (TESSALON) 100 mg Oral Capsule Take 1 Capsule (100 mg total) by mouth Three times a day as needed for Cough (Patient not taking: Reported on 07/11/2023)    Blood Sugar Diagnostic (ACCU-CHEK GUIDE TEST STRIPS) Does not apply Strip TEST BLOOD SUGAR THREE TIMES DAILY    Blood-Glucose Meter (ACCU-CHEK GUIDE GLUCOSE METER) Misc USE AS DIRECTED    clopidogreL (PLAVIX) 75 mg Oral Tablet TAKE 1 TABLET EVERY DAY    cyanocobalamin (VITAMIN B12) 1,000 mcg/mL Injection Solution INJECT 1 ML (1,000 MCG TOTAL) UNDER THE SKIN EVERY 30 DAYS    docusate sodium (COLACE) 100 mg Oral Capsule TAKE 1 CAPSULE TWICE DAILY    empagliflozin (JARDIANCE) 10 mg Oral Tablet Take 1 Tablet (10 mg total) by mouth Once a day    ergocalciferol, vitamin D2, (DRISDOL) 1,250 mcg (50,000 unit) Oral Capsule TAKE 1  CAPSULE BY MOUTH EVERY WEEK    ezetimibe (ZETIA) 10 mg Oral Tablet Take 1 Tablet (10 mg total) by mouth Every evening    fluticasone propionate (FLONASE) 50 mcg/actuation Nasal Spray, Suspension Administer 2 Sprays into each nostril Once a day    furosemide (LASIX) 40 mg Oral Tablet TAKE 1 TABLET EVERY DAY    HYDROcodone-acetaminophen (NORCO) 10-325 mg Oral Tablet Take 1 Tablet by mouth Every 8 hours as needed for Pain Reuben Likes with [facility]    Ibuprofen (MOTRIN) 800 mg Oral Tablet Take 1 Tablet (800 mg total) by mouth Three times a day as needed for Pain    Lancets (ACCU-CHEK SOFTCLIX LANCETS) Misc USE AS DIRECTED DAILY    levothyroxine (SYNTHROID) 25 mcg Oral Tablet TAKE 1 TABLET ONE TIME DAILY    metoprolol succinate (TOPROL-XL) 25 mg Oral Tablet Sustained Release 24 hr Take 0.5 Tablets (12.5 mg total) by mouth Once a day    NARCAN 4 mg/actuation Nasal Spray, Non-Aerosol PT STATED SHE HAS NARCAN AT HOME BUT IS NOT TAKING IT    Needle, Disp, 25 G 25 gauge x 1" Needle 1 mL Every 30 days    niacin (NIASPAN) 500 mg Oral Tablet Sustained Release 24 hr TAKE 1 TABLET EVERY DAY    nitroGLYCERIN (NITROSTAT) 0.4 mg Sublingual Tablet, Sublingual Place 1 Tablet (0.4 mg total) under the tongue Every 5 minutes as needed for Chest pain for 3 doses over 15 minutes    omeprazole (PRILOSEC) 40 mg Oral Capsule, Delayed Release(E.C.) TAKE 1 CAPSULE EVERY DAY    ondansetron (ZOFRAN ODT) 8 mg Oral Tablet, Rapid Dissolve DISSOLVE 1 TABLET ON THE TONGUE EVERY 8 HOURS AS NEEDED FOR NAUSEA AND VOMITING    OZEMPIC 2 mg/dose (8 mg/3 mL) Subcutaneous Pen Injector Inject 2 mg under the skin Every 7 days    pioglitazone (ACTOS) 15 mg Oral Tablet  Take 1 Tablet (15 mg total) by mouth Once a day    PRENATAL VITAMIN PLUS LOW IRON 27 mg iron- 1 mg Oral Tablet TAKE 1 TABLET EVERY DAY    rosuvastatin (CRESTOR) 40 mg Oral Tablet Take 1 Tablet (40 mg total) by mouth Once a day TAKE 1 TABLET BY MOUTH ONCE DAILY    venlafaxine  (EFFEXOR XR) 37.5 mg Oral Capsule, Sust. Release 24 hr TAKE 1 CAPSULE ONE TIME DAILY    ZYRTEC-D 5-120 mg Oral Tablet Sustained Release 12 hr Take 1 Tablet by mouth Once a day     Allergies   Allergen Reactions    Capron Dm [Pyrilamine-Dextromethorphan]  Other Adverse Reaction (Add comment)     LIGHT HEADED    Other      Coban dressing caused skin break down    Tylenol W Codeine [Acetaminophen-Codeine]  Other Adverse Reaction (Add comment)     pt states that one time she had chest pains with this med, but dr. thought it was because she had not eaten     Family Medical History:       Problem Relation (Age of Onset)    Atrial fibrillation Sister    Colon Cancer Father    Diabetes Mother, Maternal Aunt    Heart Disease Sister, Maternal Grandfather    Hypertension (High Blood Pressure) Mother    Lung Cancer Maternal Grandfather    MI <45 years of age Mother            Social History     Tobacco Use    Smoking status: Every Day     Current packs/day: 0.50     Average packs/day: 0.5 packs/day for 43.6 years (21.8 ttl pk-yrs)     Types: Cigarettes     Start date: 66    Smokeless tobacco: Never   Vaping Use    Vaping status: Never Used   Substance Use Topics    Alcohol use: No    Drug use: No     BASIC METABOLIC PANEL  Lab Results   Component Value Date    SODIUM 141 03/24/2023    POTASSIUM 4.0 03/24/2023    CHLORIDE 111 03/24/2023    CO2 22 03/24/2023    ANIONGAP 8 03/24/2023    BUN 10 03/24/2023    CREATININE 0.82 03/24/2023    BUNCRRATIO 12 03/24/2023    GFR 83 03/24/2023    CALCIUM 8.5 (L) 03/24/2023    GLUCOSE 165 (H) 03/24/2023    GLUCOSENF 177 (H) 05/08/2021        Hepatic Function  Lab Results   Component Value Date    ALBUMIN 2.9 (L) 03/24/2023    TOTALPROTEIN 5.7 (L) 03/24/2023    ALKPHOS 86 03/24/2023    PROTHROMTME 10.0 03/24/2023    INR 0.84 03/24/2023    AST 25 03/24/2023    ALT 16 03/24/2023    BILIRUBINCON 0.1 03/24/2023         Lab Results   Component Value Date    HA1C 7.6 (H) 05/06/2023     Review of  Systems  Review of Systems   Constitutional: Negative.  Negative for chills, fever, malaise/fatigue and weight loss.   HENT:  Positive for congestion and sinus pain.    Eyes: Negative.  Negative for double vision, discharge and redness.   Respiratory: Negative.  Negative for cough, hemoptysis and shortness of breath.    Cardiovascular: Negative.  Negative for chest pain, palpitations and leg swelling.   Gastrointestinal:  Negative.  Negative for abdominal pain, diarrhea, nausea and vomiting.   Genitourinary: Negative.    Musculoskeletal:  Positive for myalgias.   Skin: Negative.  Negative for rash.   Neurological:  Positive for headaches.       Physical Exam:  BP 118/80 (Site: Left Arm, Patient Position: Sitting, Cuff Size: Adult)   Pulse 93   Temp (!) 35.7 C (96.2 F)   Resp 18   Ht 1.676 m (5\' 6" )   Wt 96.2 kg (212 lb 1.6 oz)   LMP  (LMP Unknown)   SpO2 96%   BMI 34.23 kg/m       Physical Exam  Vitals and nursing note reviewed.   Constitutional:       General: She is not in acute distress.     Appearance: She is not toxic-appearing.   HENT:      Head: Normocephalic and atraumatic.      Right Ear: Tympanic membrane, ear canal and external ear normal.      Left Ear: Tympanic membrane, ear canal and external ear normal.      Nose: Congestion present.      Right Sinus: Frontal sinus tenderness present.      Left Sinus: Frontal sinus tenderness present.      Mouth/Throat:      Mouth: Mucous membranes are moist.      Pharynx: Oropharynx is clear.   Eyes:      Conjunctiva/sclera: Conjunctivae normal.   Cardiovascular:      Rate and Rhythm: Normal rate and regular rhythm.      Heart sounds: Normal heart sounds.   Pulmonary:      Effort: Pulmonary effort is normal. No tachypnea, bradypnea, accessory muscle usage or respiratory distress.      Breath sounds: Normal breath sounds.   Abdominal:      General: Bowel sounds are normal. There is no distension.      Palpations: Abdomen is soft. There is no mass.       Tenderness: There is no abdominal tenderness. There is no guarding or rebound.   Musculoskeletal:      Cervical back: Neck supple.   Lymphadenopathy:      Cervical: No cervical adenopathy.   Skin:     General: Skin is warm and dry.      Coloration: Skin is not cyanotic, jaundiced or pale.   Neurological:      Mental Status: She is alert and oriented to person, place, and time.   Psychiatric:         Mood and Affect: Affect normal.         Judgment: Judgment normal.         Assessment:    Sinusitis, unspecified chronicity, unspecified location    Headache    Body aches        Plan:  There are potential interactions with the pt's current medications and paxlovid. Discussed molnupiravir with the patient, that it is not FDA approved , that is is authorized under EUA, discussed potential risks and benefits, alternatives. Patient education handout given to the patient re: molnupiravir.    Patient Instructions   We will call with the test results  Self quarantine until COVID test results received  Follow up immediately for new or worsening symptoms. Follow up with PCP if symptoms persist. Tylenol for pain and fever. Follow up if not improving after 48 to 72 hours.       Orders Placed This Encounter    COVID-19  SCREENING - SYMPTOMATIC (OP)    amoxicillin-pot clavulanate (AUGMENTIN) 500-125 mg Oral Tablet       Follow up: No follow-ups on file.    Follow up with PCP.  Seek medical attention for new or worsening symptoms.    This note was partially created using MModal Fluency Direct system (voice recognition software) and is inherently subject to errors including those of syntax and "sound-alike" substitutions which may escape proofreading.  In such instances, original meaning may be extrapolated by contextual derivation.     I saw the patient independently.            Jeani Sow, FNP  07/26/2023, 15:55

## 2023-08-03 DIAGNOSIS — K760 Fatty (change of) liver, not elsewhere classified: Secondary | ICD-10-CM

## 2023-08-05 NOTE — Telephone Encounter (Signed)
Patient is requesting that we refer her to the Liver [facility], NP) .  She was found to have hepatic steatosis on recent MRI. I told her we could manage her here, but she is wanting to be evaluated there. She has chronic abdominal pain with an extensive prior evaluation. Please include all this information on referral. I told her she would not be able to have pain addressed there at the liver clinic, but they can certainly address hepatic steatosis if they would like to see her.

## 2023-08-11 ENCOUNTER — Other Ambulatory Visit: Payer: Self-pay

## 2023-08-11 ENCOUNTER — Inpatient Hospital Stay (INDEPENDENT_AMBULATORY_CARE_PROVIDER_SITE_OTHER): Admission: RE | Admit: 2023-08-11 | Payer: Commercial Managed Care - PPO | Source: Ambulatory Visit

## 2023-08-11 ENCOUNTER — Other Ambulatory Visit (HOSPITAL_BASED_OUTPATIENT_CLINIC_OR_DEPARTMENT_OTHER): Payer: Commercial Managed Care - PPO

## 2023-08-11 ENCOUNTER — Encounter (INDEPENDENT_AMBULATORY_CARE_PROVIDER_SITE_OTHER): Payer: Self-pay

## 2023-08-11 ENCOUNTER — Ambulatory Visit: Payer: Commercial Managed Care - PPO

## 2023-08-11 VITALS — BP 142/68 | Ht 65.98 in | Wt 212.0 lb

## 2023-08-11 DIAGNOSIS — M79674 Pain in right toe(s): Secondary | ICD-10-CM | POA: Insufficient documentation

## 2023-08-11 DIAGNOSIS — M2142 Flat foot [pes planus] (acquired), left foot: Secondary | ICD-10-CM | POA: Insufficient documentation

## 2023-08-11 DIAGNOSIS — E1165 Type 2 diabetes mellitus with hyperglycemia: Secondary | ICD-10-CM | POA: Insufficient documentation

## 2023-08-11 DIAGNOSIS — E11628 Type 2 diabetes mellitus with other skin complications: Secondary | ICD-10-CM

## 2023-08-11 DIAGNOSIS — M2141 Flat foot [pes planus] (acquired), right foot: Secondary | ICD-10-CM | POA: Insufficient documentation

## 2023-08-11 DIAGNOSIS — B351 Tinea unguium: Secondary | ICD-10-CM | POA: Insufficient documentation

## 2023-08-11 DIAGNOSIS — Z7984 Long term (current) use of oral hypoglycemic drugs: Secondary | ICD-10-CM

## 2023-08-11 DIAGNOSIS — E1100 Type 2 diabetes mellitus with hyperosmolarity without nonketotic hyperglycemic-hyperosmolar coma (NKHHC): Secondary | ICD-10-CM

## 2023-08-11 DIAGNOSIS — M7732 Calcaneal spur, left foot: Secondary | ICD-10-CM | POA: Insufficient documentation

## 2023-08-11 DIAGNOSIS — M7731 Calcaneal spur, right foot: Secondary | ICD-10-CM | POA: Insufficient documentation

## 2023-08-11 DIAGNOSIS — E1129 Type 2 diabetes mellitus with other diabetic kidney complication: Secondary | ICD-10-CM | POA: Insufficient documentation

## 2023-08-11 DIAGNOSIS — M79675 Pain in left toe(s): Secondary | ICD-10-CM | POA: Insufficient documentation

## 2023-08-11 LAB — BASIC METABOLIC PANEL
ANION GAP: 5 mmol/L (ref 4–13)
BUN/CREA RATIO: 9 (ref 6–22)
BUN: 8 mg/dL (ref 8–25)
CALCIUM: 9.1 mg/dL (ref 8.6–10.2)
CHLORIDE: 110 mmol/L (ref 96–111)
CO2 TOTAL: 25 mmol/L (ref 22–30)
CREATININE: 0.86 mg/dL (ref 0.60–1.05)
ESTIMATED GFR - FEMALE: 78 mL/min/BSA (ref >=60)
GLUCOSE: 159 mg/dL — ABNORMAL HIGH (ref 65–125)
POTASSIUM: 3.7 mmol/L (ref 3.5–5.1)
SODIUM: 140 mmol/L (ref 136–145)

## 2023-08-11 LAB — MICROALBUMIN/CREATININE RATIO, URINE, RANDOM
CREATININE RANDOM URINE: 54 mg/dL
MICROALBUMIN RANDOM URINE: <0.5 mg/dL

## 2023-08-11 NOTE — Procedures (Signed)
[name]  [address]  [place] [address]  Operated by [facility]  Procedure Note    Name: Monique [name] MRN:  [id number]   Date: 08/11/2023 DOB:  07-15-1966 (56 y.o.)         XR FEET WT BEARING BILATERAL (AMB ONLY)    Performed by: Lillia Mountain, DPM  Authorized by: Lillia Mountain, DPM    Time Out:     Immediately before the procedure, a time out was called:  Yes    Patient verified:  Yes    Procedure Verified:  Yes    Site Verified:  Yes  Documentation:      Bilateral foot x-rays weight-bearing  Left foot:  Plantar and retrocalcaneal spurring noted, os peroneum, decreased calcaneal inclination, degenerative changes through the midfoot, degenerative changes to the posterior facet of the talus.  Elevated 1st ray.  Contracted 2nd digit.  Bone density normal, multiple other joint lines normal.  Right foot:  Plantar calcaneal spurring noted, degenerative changes through the midfoot, degenerative changes to the ankle joint, normal bone density, all the joint lines normal.  Inconclusive for acute pathology.  96295 - DEBRIDEMENT OF NAIL BY ANY METHOD; 6 OR MORE (AMB ONLY)    Performed by: Lillia Mountain, DPM  Authorized by: Lillia Mountain, DPM    Time Out:     Immediately before the procedure, a time out was called:  Yes    Patient verified:  Yes    Procedure Verified:  Yes    Site Verified:  Yes  Documentation:      Aseptic Debridement of nails 1-5 bilateral of dystrophic discolored and elongated painful nails bilateral.  Use of sterile grinder to remove thickness of painful thickened nails.  Patient tolerated procedure well.       Lillia Mountain, [place]

## 2023-08-11 NOTE — Progress Notes (Addendum)
[name]  [address]  [place] [address]  Operated by [facility]    Diabetic Foot Exam       Name: Monique [name] MRN:  [id number]   Date: 08/11/2023 Age: 57 y.o.     Chief Complaint:    Diabetes Foot Check (A1C:7.6% on 05/06/23 (done again today-not in chart yet). PCP: 04/09/23 Tobacco use: yes- 0.5 packs a day. FWB in tennis shoes. Pt is sensitive to touch in feet.)      HPI: Monique [name] is a 57 y.o. female who presents for diabetic examination. Presently needing a diabetic education and evaluation, c/o painful elongated painful toenails and calluses. Advised by Oneta Rack, MD  to seek regular visits to podiatry as a preventative measure. Additionally, patient verbalizes that she has very sensitive feet. She also c/o her medial border on her right hallux. Denies any other complaints.       History:  Past Medical History:   Diagnosis Date    Anxiety     Arthritis     Back problem     DDD    Beta blocker prescribed for left ventricular systolic dysfunction     Blood thinned due to long-term anticoagulant use     Carotid stenosis     occlusion of left ICA    Chest pain 04/03/2018    Chronic back pain     Chronic obstructive pulmonary disease, unspecified COPD type (CMS HCC) 12/31/2022    Coronary artery disease involving native coronary artery of native heart     DDD (degenerative disc disease), lumbar     "entire spine"    Ear piercing     GERD (gastroesophageal reflux disease)     controlled with omeprazole    H/O complete eye exam     2 years, Dr. Thompson Caul, at [facility]    History of dental examination     dentures upper and lower    HTN     Hx of coronary artery bypass graft     2012    Hx of gastric bypass     Hypercholesterolemia     Hyperlipidemia     Hypothyroid     MI (myocardial infarction) (CMS HCC) 2012    Neck problem     herniated cervical disc    Obesity (BMI 30-39.9)     Peripheral vascular disease, unspecified (CMS HCC) 01/03/2021     Solitary nodule of right lobe of thyroid 03/30/2018    Incidental finding on MRI thoracic spine. [place] ordered.     Stented coronary artery     x4    Stroke (CMS [facility])     2002    Tattoo     Left breast, Right shoulder    Thyroid disorder     Thyroid nodule     Type 2 diabetes mellitus (CMS HCC)     HGA1C 10.4 on 12/19/21    Uncontrolled type 2 diabetes mellitus, without long-term current use of insulin     Xanthelasma          Family Medical History:       Problem Relation (Age of Onset)    Atrial fibrillation Sister    Colon Cancer Father    Diabetes Mother, Maternal Aunt    Heart Disease Sister, Maternal Grandfather    Hypertension (High Blood Pressure) Mother    Lung Cancer Maternal Grandfather    MI <67 years of age Mother  Social History     Socioeconomic History    Marital status: Married     Spouse name: Harvie Heck    Number of children: 3    Years of education: completed 11th grade   Occupational History    Occupation: disabled   Tobacco Use    Smoking status: Every Day     Current packs/day: 0.50     Average packs/day: 0.5 packs/day for 43.7 years (21.8 ttl pk-yrs)     Types: Cigarettes     Start date: 86    Smokeless tobacco: Never   Vaping Use    Vaping status: Never Used   Substance and Sexual Activity    Alcohol use: No    Drug use: No    Sexual activity: Yes     Partners: Male   Other Topics Concern    Ability to Walk 1 Flight of Steps without SOB/CP Yes    Routine Exercise No    Ability to Walk 2 Flight of Steps without SOB/CP Yes    Ability To Do Own ADL's Yes    Uses Walker No    Other Activity Level Yes     Comment: on the go all the time - does cleaning, grocery shopping    Uses Cane No   Social History Narrative    Living situation: lives at home with husband, Harvie Heck, and daughter.  1 dog named Duke and 1 cat named Lucky.    Nutrition:  Eats 5 small meals d/t gastric sleeve surgery.     Caffeine use: none, decaf coffee    Exercise: stays busy around house. No formal form of exercise    Seatbelt  use: sometimes    Fire extinguishers in the home: yes    Smoke alarms in the home: yes    Carbon monoxide detectors in the home: yes    Wynelle Link exposure: sunglasses        Monique [name] would like for husband, Sophiamarie Knaup,  to speak for her in the event she would become incapacitated.    Full code.      Social Determinants of Health     Social Connections: Medium Risk (02/17/2023)    Social Connections     SDOH Social Isolation: 3 to 5 times a week      Past Surgical History:   Procedure Laterality Date    ENDOMETRIAL ABLATION  1998    HX ANKLE FRACTURE TX  1990s    Right, [name] procedure    HX CATARACT REMOVAL Bilateral 2013    r and l eyes with implants    HX CESAREAN SECTION  06/20/2000    x3, 11/21/86, 11/10/84    HX CHOLECYSTECTOMY  1988    HX CORONARY ARTERY BYPASS GRAFT  03/31/2011    cardiac, 2 vessel, Dr. Francesco Runner, [facility]    HX CORONARY STENT PLACEMENT  2012    x4 stents    HX GASTRIC SLEEVE  02/2017    [place], [place], Dr. Devin Going HEART CATHETERIZATION  2012    massive heart attack and stents - [facility]    HX THYROID BIOPSY      HX THYROIDECTOMY  101/01/2018    HX TONSILLECTOMY      as child    HX YAG Left 09/20/2013         Medications:  Current Outpatient Medications   Medication Sig    aspirin (ECOTRIN) 81 mg Oral Tablet, Delayed Release (E.C.) Take 1 Tablet (81 mg total)  by mouth Once a day    benzonatate (TESSALON) 100 mg Oral Capsule Take 1 Capsule (100 mg total) by mouth Three times a day as needed for Cough (Patient not taking: Reported on 07/11/2023)    Blood Sugar Diagnostic (ACCU-CHEK GUIDE TEST STRIPS) Does not apply Strip TEST BLOOD SUGAR THREE TIMES DAILY    Blood-Glucose Meter (ACCU-CHEK GUIDE GLUCOSE METER) Misc USE AS DIRECTED    clopidogreL (PLAVIX) 75 mg Oral Tablet TAKE 1 TABLET EVERY DAY    cyanocobalamin (VITAMIN B12) 1,000 mcg/mL Injection Solution INJECT 1 ML (1,000 MCG TOTAL) UNDER THE SKIN EVERY 30 DAYS    docusate sodium (COLACE) 100 mg Oral Capsule TAKE 1 CAPSULE TWICE DAILY     empagliflozin (JARDIANCE) 10 mg Oral Tablet Take 1 Tablet (10 mg total) by mouth Once a day    ergocalciferol, vitamin D2, (DRISDOL) 1,250 mcg (50,000 unit) Oral Capsule TAKE 1 CAPSULE BY MOUTH EVERY WEEK    ezetimibe (ZETIA) 10 mg Oral Tablet Take 1 Tablet (10 mg total) by mouth Every evening    fluticasone propionate (FLONASE) 50 mcg/actuation Nasal Spray, Suspension Administer 2 Sprays into each nostril Once a day (Patient not taking: Reported on 08/11/2023)    furosemide (LASIX) 40 mg Oral Tablet TAKE 1 TABLET EVERY DAY    HYDROcodone-acetaminophen (NORCO) 10-325 mg Oral Tablet Take 1 Tablet by mouth Every 8 hours as needed for Pain Reuben Likes with [facility]    Ibuprofen (MOTRIN) 800 mg Oral Tablet Take 1 Tablet (800 mg total) by mouth Three times a day as needed for Pain    Lancets (ACCU-CHEK SOFTCLIX LANCETS) Misc USE AS DIRECTED DAILY    levothyroxine (SYNTHROID) 25 mcg Oral Tablet TAKE 1 TABLET ONE TIME DAILY    metoprolol succinate (TOPROL-XL) 25 mg Oral Tablet Sustained Release 24 hr Take 0.5 Tablets (12.5 mg total) by mouth Once a day    NARCAN 4 mg/actuation Nasal Spray, Non-Aerosol PT STATED SHE HAS NARCAN AT HOME BUT IS NOT TAKING IT (Patient not taking: Reported on 08/11/2023)    Needle, Disp, 25 G 25 gauge x 1" Needle 1 mL Every 30 days    niacin (NIASPAN) 500 mg Oral Tablet Sustained Release 24 hr TAKE 1 TABLET EVERY DAY    nitroGLYCERIN (NITROSTAT) 0.4 mg Sublingual Tablet, Sublingual Place 1 Tablet (0.4 mg total) under the tongue Every 5 minutes as needed for Chest pain for 3 doses over 15 minutes    omeprazole (PRILOSEC) 40 mg Oral Capsule, Delayed Release(E.C.) TAKE 1 CAPSULE EVERY DAY    ondansetron (ZOFRAN ODT) 8 mg Oral Tablet, Rapid Dissolve DISSOLVE 1 TABLET ON THE TONGUE EVERY 8 HOURS AS NEEDED FOR NAUSEA AND VOMITING    OZEMPIC 2 mg/dose (8 mg/3 mL) Subcutaneous Pen Injector Inject 2 mg under the skin Every 7 days    pioglitazone (ACTOS) 15 mg Oral Tablet Take 1 Tablet  (15 mg total) by mouth Once a day    PRENATAL VITAMIN PLUS LOW IRON 27 mg iron- 1 mg Oral Tablet TAKE 1 TABLET EVERY DAY    rosuvastatin (CRESTOR) 40 mg Oral Tablet Take 1 Tablet (40 mg total) by mouth Once a day TAKE 1 TABLET BY MOUTH ONCE DAILY    venlafaxine (EFFEXOR XR) 37.5 mg Oral Capsule, Sust. Release 24 hr TAKE 1 CAPSULE ONE TIME DAILY    ZYRTEC-D 5-120 mg Oral Tablet Sustained Release 12 hr Take 1 Tablet by mouth Once a day     Allergies:  Allergies  Allergen Reactions    Capron Dm [Pyrilamine-Dextromethorphan]  Other Adverse Reaction (Add comment)     LIGHT HEADED    Other      Coban dressing caused skin break down    Tylenol W Codeine [Acetaminophen-Codeine]  Other Adverse Reaction (Add comment)     pt states that one time she had chest pains with this med, but dr. thought it was because she had not eaten       ROS:  Review of systems negative except noted in HPI     PE:  Vitals:    08/11/23 1311   BP: (!) 142/68   Weight: 96.2 kg (212 lb)   Height: 1.676 m (5' 5.98")   BMI: 34.31       Vascular:   Dorsalis pedis and posterior tibial pulses palpable b/l.  Capillary Fill time < 3 seconds to digits 1-5 b/l.  Skin temperature warm to warm tibial tuberosity to the digits b/l.  Hair growth present.  Non pitting edema bilaterally  Varicosities medial ankle bilaterally      Neurological:   Intact vibratory sensation at the hallux IPJ b/l.  Intact protective sensation b/l via SWMF.     Dermatological:   Skin appears well hydrated and supple.   Good color, texture, turgor.   No open lesions present.   No callosities present.   Webspaces clean and dry 1-4 b/l.   Skin:   No skin rash, subcutaneous nodules, lesions or ulcers observed b/l LE  Skin is warm and dry with normal turgor   No interdigital maceration b/l    Nails 1-5 bilaterally are elongated, discolored, and dystrophic.    Musculoskeletal/Orthopaedic:   Structurally within normal limits.  +5/5 muscle strength Dorsiflexion, Plantarflexion, Inversion,  Eversion b/l.  ROM of the 1st MTP joint is full b/l.    ROM of the MTJ/STJ is full without pain or crepitus b/l.    Ankle joint ROM is normal b/l.       Imaging Studies and Procedures:      XR FEET WT BEARING BILATERAL (AMB ONLY)     Performed by: Lillia Mountain, DPM  Authorized by: Lillia Mountain, DPM    Time Out:     Immediately before the procedure, a time out was called:  Yes    Patient verified:  Yes    Procedure Verified:  Yes    Site Verified:  Yes  Documentation:      Bilateral foot x-rays weight-bearing  Left foot:  Plantar and retrocalcaneal spurring noted, os peroneum, decreased calcaneal inclination, degenerative changes through the midfoot, degenerative changes to the posterior facet of the talus.  Elevated 1st ray.  Contracted 2nd digit.  Bone density normal, multiple other joint lines normal.  Right foot:  Plantar calcaneal spurring noted, degenerative changes through the midfoot, degenerative changes to the ankle joint, normal bone density, all the joint lines normal.  Inconclusive for acute pathology.  42706 - DEBRIDEMENT OF NAIL BY ANY METHOD; 6 OR MORE (AMB ONLY)     Performed by: Lillia Mountain, DPM  Authorized by: Lillia Mountain, DPM    Time Out:     Immediately before the procedure, a time out was called:  Yes    Patient verified:  Yes    Procedure Verified:  Yes    Site Verified:  Yes  Documentation:      Aseptic Debridement of nails 1-5 bilateral of dystrophic discolored and elongated painful nails bilateral.  Use of sterile grinder to remove thickness  of painful thickened nails.  Patient tolerated procedure well.     Assessment and Plan:    ICD-10-CM    1. Onychomycosis  B35.1       2. Type 2 diabetes mellitus with hyperosmolarity without coma, without long-term current use of insulin (CMS HCC)  E11.00 XR FEET WT BEARING BILATERAL     XR FEET WT BEARING BILATERAL (AMB ONLY)      3. Pain in toes of both feet  M79.674     M79.675       4. Calcaneal spur of both feet  M77.31     M77.32        5. Pes planus of both feet  M21.41     M21.42            Patient is a 57 y.o. female type 2 Diabetic with painful elongated toenails 1 through 5 bilateral, as well seeking podiatric care as advised by primary care.  Performed a complete Diabetic examination of both feet and ankles. Diabetes education was provided to the patient emphasizing the need for proper shoe gear and daily hygiene, daily inspection, early intervention for foot problems, avoidance of self-care, and the need to maintain the recommended timeframe between at-risk foot care appointments to reduce the likelihood of developing potentially serious foot problems. The patient was advised to RTC immediately if any acute foot problems arise no matter how insignificant they may seem to the patient. Debridement of dystrophic and mycotic toenails in length and thickness 1-5 bilateral by way of an electric grinder to as close to normal thickness as the patient would tolerate with good relief obtained as evidenced by pain-free ambulation.  The treatment of the toenails is necessary due to patient's Diabetes.    Informed Patient on the need to control blood sugar and how diabetes affects the feet  Instructed Patient to check feet daily, to keep feet moisturized, and to always check shoes before putting on.  Discussed elevated sugars in relation to affecting the nerves in the feet  Informed Pt that an A1c of 6.5 is acceptable, but a fasting glucose reading of 140 is uncontrolled  Discussed Diabetic neuropathy and how it affects the feet  Discussed and explained to Pt that if they lose the protective sensation, they won't be able to detect the sensation of stepping on something  Informed Pt that diabetes as a disease in general is a disease of progression but is a condition that is treatable.    Bilateral Xrays were taken to evaluate the patient's bony foot structures, evaluate for potential issues, and create a baseline to compare to for potential  osteomyelitis or charcot neuroarthropathies.    Recommended a daily lotion, such as gold bond diabetic cream     Discussed patient etiology of fungal toenails.  Discussed with patient treatment plans for fungal toenails including topical treatment, and pharmaceutical treatment, and chemical matrixectomy.  Discussed with the patient that topical treatment involves application of lacquer daily for approximately 3 months and is most effective in fungal nails that are thin in nature  Discussed with the patient pharmaceutical option involving Lamisil medication.  Discussed with the patient that this medication, like other medications, or fail to the liver and therefore a liver enzyme test is necessary before starting treatment.  Discussed with patient the Lamisil option involves taking Lamisil for 3 weeks daily, followed by 3 weeks not taking the medication, then taking 1 pill daily for 1 week, stopping for 3 weeks, taken 1 pill  daily for 1 week.  Discussed with the patient necessity to use disinfectant spray in all shoes used  Discussed with the patient that complete chemical matricectomy is another option to completely alleviate fungal nail as well as the nail itself if other options are not amenable.  Patient elects to just have nails trimmed routinely instead of treating fungal nails.      Follow Up:  Return in about 10 weeks (around 10/20/2023) for diabetic foot care.    I am scribing for, and in the presence of, Dr. Faythe Casa for services provided on 08/11/2023.  Ludger Nutting, SCRIBE     [place], [place]    I personally performed the services described in this documentation, as scribed  in my presence, and it is both accurate  and complete.    [place], DPM     [place], [place]

## 2023-08-12 LAB — HGA1C (HEMOGLOBIN A1C WITH EST AVG GLUCOSE): HEMOGLOBIN A1C: 8.9 % — ABNORMAL HIGH (ref <5.7)

## 2023-08-13 ENCOUNTER — Encounter (INDEPENDENT_AMBULATORY_CARE_PROVIDER_SITE_OTHER): Payer: Self-pay

## 2023-08-19 ENCOUNTER — Other Ambulatory Visit: Payer: Self-pay

## 2023-08-19 DIAGNOSIS — K760 Fatty (change of) liver, not elsewhere classified: Secondary | ICD-10-CM

## 2023-08-19 NOTE — Telephone Encounter (Signed)
[place] with elasto scheduled.  Okay a lot going on in these messages. Are we doing a referral somewhere or not?

## 2023-08-19 NOTE — Telephone Encounter (Signed)
Mandy: please arrange non-urgent appt with Dr. Claire Shown: I have ordered labs patient can have done for further evaluation of fatty liver. Just need to release these.   Mindy/Tammy: needs [place] with elastography due to fatty liver.   Thanks!

## 2023-08-19 NOTE — Telephone Encounter (Signed)
Noted and released  

## 2023-08-19 NOTE — Addendum Note (Signed)
Addended by: Gelene Mink on: 08/19/2023 12:24 PM   Modules accepted: Orders

## 2023-08-19 NOTE — Addendum Note (Signed)
Addended by: Armstead Peaks on: 08/19/2023 01:13 PM   Modules accepted: Orders

## 2023-08-24 ENCOUNTER — Other Ambulatory Visit: Payer: Self-pay

## 2023-08-24 NOTE — Progress Notes (Signed)
These test are only done [facility]. ELF and NASH fibroSure plus

## 2023-08-26 ENCOUNTER — Ambulatory Visit (HOSPITAL_COMMUNITY)
Admission: RE | Admit: 2023-08-26 | Discharge: 2023-08-26 | Disposition: A | Discharge status: Home / Self Care | Payer: 59 | Source: Ambulatory Visit | Attending: Gastroenterology | Admitting: Gastroenterology

## 2023-08-26 DIAGNOSIS — K760 Fatty (change of) liver, not elsewhere classified: Secondary | ICD-10-CM | POA: Diagnosis present

## 2023-08-27 LAB — ENHANCED LIVER FIBROSIS (ELF): ELF(TM) Score: 8.39 (ref <9.80)

## 2023-08-28 LAB — NASH FIBROSURE(R) PLUS
ALPHA 2-MACROGLOBULINS, QN: 300 mg/dL — ABNORMAL HIGH (ref 110–276)
ALT (SGPT) P5P: 17 IU/L (ref 0–40)
AST (SGOT) P5P: 21 IU/L (ref 0–40)
Apolipoprotein A-1: 128 mg/dL (ref 116–209)
Bilirubin, Total: 0.4 mg/dL (ref 0.0–1.2)
Cholesterol, Total: 223 mg/dL — ABNORMAL HIGH (ref 100–199)
Fibrosis Score: 0.45 — ABNORMAL HIGH (ref 0.00–0.21)
GGT: 45 IU/L (ref 0–60)
Glucose: 90 mg/dL (ref 70–99)
Haptoglobin: 109 mg/dL (ref 33–346)
NASH Score: 0.48 — ABNORMAL HIGH (ref 0.00–0.25)
Steatosis Score: 0.58 — ABNORMAL HIGH (ref 0.00–0.40)
Triglycerides: 305 mg/dL — ABNORMAL HIGH (ref 0–149)

## 2023-09-04 NOTE — Telephone Encounter (Signed)
Mandy:   I think there were so many messages it may have gotten lost in shuffle. Please arrange appt with Dr. Marletta Lor ONLY (30 minute slot). It needs to be an 1130 or a 330 in a 30 min slot. Thanks!! He is aware of this. She needs appt with MD only right now.   Mindy: I agree, lots going on in these messages. Do not worry about referral right this second.

## 2023-09-08 ENCOUNTER — Other Ambulatory Visit (INDEPENDENT_AMBULATORY_CARE_PROVIDER_SITE_OTHER): Payer: Self-pay | Admitting: Family Medicine

## 2023-09-08 ENCOUNTER — Ambulatory Visit (INDEPENDENT_AMBULATORY_CARE_PROVIDER_SITE_OTHER): Payer: Commercial Managed Care - PPO | Admitting: Internal Medicine

## 2023-09-08 ENCOUNTER — Encounter (HOSPITAL_BASED_OUTPATIENT_CLINIC_OR_DEPARTMENT_OTHER): Payer: Self-pay | Admitting: Internal Medicine

## 2023-09-08 ENCOUNTER — Other Ambulatory Visit: Payer: Self-pay

## 2023-09-08 VITALS — BP 120/68 | HR 62 | Ht 65.5 in | Wt 214.0 lb

## 2023-09-08 DIAGNOSIS — Z7984 Long term (current) use of oral hypoglycemic drugs: Secondary | ICD-10-CM

## 2023-09-08 DIAGNOSIS — E785 Hyperlipidemia, unspecified: Secondary | ICD-10-CM

## 2023-09-08 DIAGNOSIS — I2510 Atherosclerotic heart disease of native coronary artery without angina pectoris: Secondary | ICD-10-CM

## 2023-09-08 DIAGNOSIS — I10 Essential (primary) hypertension: Secondary | ICD-10-CM

## 2023-09-08 DIAGNOSIS — F17210 Nicotine dependence, cigarettes, uncomplicated: Secondary | ICD-10-CM

## 2023-09-08 NOTE — Progress Notes (Unsigned)
Cardiology Clinic Follow Up Note    Name:  Monique [name]   DOB: [birth year]   MRN: [id number]   Date:  09/08/2023     PCP: Oneta Rack, MD     History  Monique [name] is a 57 y.o. female with coronary artery disease who presents today for follow-up visit.  The patient had coronary artery bypass surgery in 2012.  She has longstanding tobacco use and poorly controlled diabetes mellitus..  myocardial infarction in 2012 and    Last testing:    Past Medical History:   Diagnosis Date    Anxiety     Arthritis     Back problem     DDD    Beta blocker prescribed for left ventricular systolic dysfunction     Blood thinned due to long-term anticoagulant use     Carotid stenosis     occlusion of left ICA    Chest pain 04/03/2018    Chronic back pain     Chronic obstructive pulmonary disease, unspecified COPD type (CMS HCC) 12/31/2022    Coronary artery disease involving native coronary artery of native heart     DDD (degenerative disc disease), lumbar     "entire spine"    Ear piercing     GERD (gastroesophageal reflux disease)     controlled with omeprazole    H/O complete eye exam     2 years, Dr. Thompson Caul, at [facility]    History of dental examination     dentures upper and lower    HTN     Hx of coronary artery bypass graft     2012    Hx of gastric bypass     Hypercholesterolemia     Hyperlipidemia     Hypothyroid     MI (myocardial infarction) (CMS HCC) 2012    Neck problem     herniated cervical disc    Obesity (BMI 30-39.9)     Peripheral vascular disease, unspecified (CMS HCC) 01/03/2021    Solitary nodule of right lobe of thyroid 03/30/2018    Incidental finding on MRI thoracic spine. [place] ordered.     Stented coronary artery     x4    Stroke (CMS [facility])     2002    Tattoo     Left breast, Right shoulder    Thyroid disorder     Thyroid nodule     Type 2 diabetes mellitus (CMS HCC)     HGA1C 10.4 on 12/19/21    Uncontrolled type 2 diabetes mellitus, without long-term current use of insulin     Xanthelasma            Patient  Active Problem List    Diagnosis Date Noted    Tobacco use disorder 02/18/2023    Weakness 02/16/2023    Atherosclerosis of native artery of right lower extremity with rest pain (CMS HCC) 12/31/2022    Chronic obstructive pulmonary disease, unspecified COPD type (CMS HCC) 12/31/2022    Unspecified inflammatory spondylopathy, lumbar region (CMS HCC) 07/17/2022    Other specified disorders of adrenal gland 07/17/2022    Left leg weakness 05/15/2021    Dizziness 05/15/2021    Chronic lumbar radiculopathy 05/15/2021    Hypertension associated with type 2 diabetes mellitus (CMS HCC)  (CMS HCC) 05/15/2021    Type 2 diabetes mellitus with complication, without long-term current use of insulin (CMS HCC) 05/15/2021    Major depressive disorder, recurrent, unspecified 01/03/2021    Peripheral vascular disease,  unspecified (CMS HCC) 01/03/2021    Fracture of phalanx of finger of right hand 12/05/2019    Spinal pain 12/05/2019    Type 2 diabetes mellitus with hyperglycemia (CMS HCC) 12/05/2019    Upper respiratory infection 12/05/2019    Thyroid disorder 09/16/2019    UDS: 07/28/19 08/02/2019    Sinus congestion 12/07/2018    Morbid obesity (CMS HCC) 09/13/2018    Type 2 diabetes mellitus with hyperosmolarity without coma, without long-term current use of insulin (CMS HCC)     Coronary artery disease involving native coronary artery of native heart     GERD (gastroesophageal reflux disease)     HTN (hypertension)     Hyperlipidemia     Constipation 04/15/2018    Thyroid nodule 04/15/2018    Chest pain 04/03/2018    DDD (degenerative disc disease), lumbar      "entire spine"      Anxiety     Chronic back pain     Carotid stenosis     BMI 32.0-32.9,adult     H/O complete eye exam     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 Francesco Runner, [facility]    HX CORONARY STENT PLACEMENT  2012    x4 stents    HX GASTRIC SLEEVE  02/2017    [place], [place], Dr. Devin Going HEART CATHETERIZATION  2012    massive heart attack and stents - [facility]    HX THYROID BIOPSY      HX THYROIDECTOMY  101/01/2018    HX TONSILLECTOMY      as child    HX YAG Left 09/20/2013            Current Outpatient Medications   Medication Sig    aspirin (ECOTRIN) 81 mg Oral Tablet, Delayed Release (E.C.) Take 1 Tablet (81 mg total) by mouth Once a day    benzonatate (TESSALON) 100 mg Oral Capsule Take 1 Capsule (100 mg total) by mouth Three times a day as needed for Cough (Patient not taking: Reported on 07/11/2023)    Blood Sugar Diagnostic (ACCU-CHEK GUIDE TEST STRIPS) Does not apply Strip TEST BLOOD SUGAR THREE TIMES DAILY    Blood-Glucose Meter (ACCU-CHEK GUIDE GLUCOSE METER) Misc USE AS DIRECTED    clopidogreL (PLAVIX) 75 mg Oral Tablet TAKE 1 TABLET EVERY DAY    cyanocobalamin (VITAMIN B12) 1,000 mcg/mL Injection Solution INJECT 1 ML (1,000 MCG TOTAL) UNDER THE SKIN EVERY 30 DAYS    docusate sodium (COLACE) 100 mg Oral Capsule TAKE 1 CAPSULE TWICE DAILY    empagliflozin (JARDIANCE) 10 mg Oral Tablet Take 1 Tablet (10 mg total) by mouth Once a day    ergocalciferol, vitamin D2, (DRISDOL) 1,250 mcg (50,000 unit) Oral Capsule TAKE 1 CAPSULE BY MOUTH EVERY WEEK    ezetimibe (ZETIA) 10 mg Oral Tablet Take 1 Tablet (10 mg total) by mouth Every evening  fluticasone propionate (FLONASE) 50 mcg/actuation Nasal Spray, Suspension Administer 2 Sprays into each nostril Once a day (Patient not taking: Reported on 08/11/2023)    furosemide (LASIX) 40 mg Oral Tablet TAKE 1 TABLET EVERY DAY    HYDROcodone-acetaminophen (NORCO) 10-325 mg Oral Tablet Take 1 Tablet by mouth  Every 8 hours as needed for Pain Reuben Likes with [facility]    Ibuprofen (MOTRIN) 800 mg Oral Tablet Take 1 Tablet (800 mg total) by mouth Three times a day as needed for Pain    Lancets (ACCU-CHEK SOFTCLIX LANCETS) Misc USE AS DIRECTED DAILY    levothyroxine (SYNTHROID) 25 mcg Oral Tablet TAKE 1 TABLET ONE TIME DAILY    metoprolol succinate (TOPROL-XL) 25 mg Oral Tablet Sustained Release 24 hr Take 0.5 Tablets (12.5 mg total) by mouth Once a day    NARCAN 4 mg/actuation Nasal Spray, Non-Aerosol PT STATED SHE HAS NARCAN AT HOME BUT IS NOT TAKING IT (Patient not taking: Reported on 08/11/2023)    Needle, Disp, 25 G 25 gauge x 1" Needle 1 mL Every 30 days    niacin (NIASPAN) 500 mg Oral Tablet Sustained Release 24 hr TAKE 1 TABLET EVERY DAY    nitroGLYCERIN (NITROSTAT) 0.4 mg Sublingual Tablet, Sublingual Place 1 Tablet (0.4 mg total) under the tongue Every 5 minutes as needed for Chest pain for 3 doses over 15 minutes    omeprazole (PRILOSEC) 40 mg Oral Capsule, Delayed Release(E.C.) TAKE 1 CAPSULE EVERY DAY    ondansetron (ZOFRAN ODT) 8 mg Oral Tablet, Rapid Dissolve DISSOLVE 1 TABLET ON THE TONGUE EVERY 8 HOURS AS NEEDED FOR NAUSEA AND VOMITING    OZEMPIC 2 mg/dose (8 mg/3 mL) Subcutaneous Pen Injector Inject 2 mg under the skin Every 7 days    pioglitazone (ACTOS) 15 mg Oral Tablet Take 1 Tablet (15 mg total) by mouth Once a day    PRENATAL VITAMIN PLUS LOW IRON 27 mg iron- 1 mg Oral Tablet TAKE 1 TABLET EVERY DAY    rosuvastatin (CRESTOR) 40 mg Oral Tablet Take 1 Tablet (40 mg total) by mouth Once a day TAKE 1 TABLET BY MOUTH ONCE DAILY    venlafaxine (EFFEXOR XR) 37.5 mg Oral Capsule, Sust. Release 24 hr TAKE 1 CAPSULE ONE TIME DAILY    ZYRTEC-D 5-120 mg Oral Tablet Sustained Release 12 hr Take 1 Tablet by mouth Once a day        Allergies   Allergen Reactions    Capron Dm [Pyrilamine-Dextromethorphan]  Other Adverse Reaction (Add comment)     LIGHT HEADED    Other      Coban dressing caused  skin break down    Tylenol W Codeine [Acetaminophen-Codeine]  Other Adverse Reaction (Add comment)     pt states that one time she had chest pains with this med, but dr. thought it was because she had not eaten       Family Medical History:       Problem Relation (Age of Onset)    Atrial fibrillation Sister    Colon Cancer Father    Diabetes Mother, Maternal Aunt    Heart Disease Sister, Maternal Grandfather    Hypertension (High Blood Pressure) Mother    Lung Cancer Maternal Grandfather    MI <31 years of age Mother              Social History     Tobacco Use    Smoking status: Every Day     Current packs/day: 0.50  Average packs/day: 0.5 packs/day for 43.7 years (21.9 ttl pk-yrs)     Types: Cigarettes     Start date: 83    Smokeless tobacco: Never   Substance Use Topics    Alcohol use: No       REVIEW OF SYSTEMS:  10 system review is negative other than that stated in the HPI  Physical Exam  BP 120/68 Comment: RTA  Pulse 62   Ht 1.664 m (5' 5.5")   Wt 97.1 kg (214 lb)   LMP  (LMP Unknown)   SpO2 96%   BMI 35.07 kg/m       GEN:  Pleasant, conversant and in no acute distress  HEENT: Pupils equal and reactive.  Sclera nonicteric  Neck: No JVD, thyromegaly or bruit  Lungs: Clear bilaterally  CV:  Regular rhythm.  Normal S1-S2.  ABD: Soft and nontender with no appreciable organomegaly  E XT: No cyanosis or edema  NEURO: CN 2-12 intact with no focal defects`  SKIN: No suspicious skin lesions         Assesment and Plan   No diagnosis found.      Duane Boston, MD  09/08/2023, 15:29

## 2023-09-09 ENCOUNTER — Ambulatory Visit (INDEPENDENT_AMBULATORY_CARE_PROVIDER_SITE_OTHER): Payer: Self-pay | Admitting: Surgery

## 2023-09-09 NOTE — Telephone Encounter (Signed)
Last scheduled appointment with you was 04/09/2023.  Currently scheduled future appointment is 10/08/2023.      Egbert Garibaldi, LPN  16/12/958, 08:33

## 2023-09-11 ENCOUNTER — Encounter (INDEPENDENT_AMBULATORY_CARE_PROVIDER_SITE_OTHER): Payer: Self-pay

## 2023-09-11 ENCOUNTER — Other Ambulatory Visit: Payer: Self-pay

## 2023-09-11 ENCOUNTER — Ambulatory Visit (INDEPENDENT_AMBULATORY_CARE_PROVIDER_SITE_OTHER): Payer: Commercial Managed Care - PPO

## 2023-09-11 VITALS — BP 118/62 | HR 68 | Temp 97.7°F | Resp 18 | Ht 66.0 in | Wt 213.0 lb

## 2023-09-11 DIAGNOSIS — J069 Acute upper respiratory infection, unspecified: Secondary | ICD-10-CM

## 2023-09-11 MED ORDER — PROMETHAZINE-DM 6.25 MG-15 MG/5 ML ORAL SYRUP
5.0000 mL | ORAL_SOLUTION | Freq: Four times a day (QID) | ORAL | 0 refills | Status: AC | PRN
Start: 2023-09-11 — End: 2023-09-21

## 2023-09-11 MED ORDER — FLUTICASONE PROPIONATE 50 MCG/ACTUATION NASAL SPRAY,SUSPENSION
2.0000 | Freq: Every day | NASAL | 0 refills | Status: AC
Start: 2023-09-11 — End: 2023-10-11

## 2023-09-11 NOTE — Progress Notes (Signed)
URGENT CARE, [facility]  [address]  [place] [address]    Progress Note    Name: [name] MRN:  [id number]   Date: 09/11/2023 DOB:  08-22-1966 (57 y.o.)               Encounter Date: 09/11/2023   PCP: Oneta Rack, MD       Reason for Visit: Cough (X 3-4 days), Congestion, Runny Nose, and Sore Throat    History of Present Illness:  Monique [name] is a 57 y.o. female who is being seen today for a cough, congestion, and sore throat x 3 days. States she sometimes feels like her chest is tight when she coughs. Denies recorded fevers but states she has had intermittent chills. No shortness of breath or wheezing. Denies history of lung disease but it appears she has a documented history of COPD.     There are no exam notes on file for this visit.    Past Medical History:   Diagnosis Date    Anxiety     Arthritis     Back problem     DDD    Beta blocker prescribed for left ventricular systolic dysfunction     Blood thinned due to long-term anticoagulant use     Carotid stenosis     occlusion of left ICA    Chest pain 04/03/2018    Chronic back pain     Chronic obstructive pulmonary disease, unspecified COPD type (CMS HCC) 12/31/2022    Coronary artery disease involving native coronary artery of native heart     DDD (degenerative disc disease), lumbar     "entire spine"    Ear piercing     GERD (gastroesophageal reflux disease)     controlled with omeprazole    H/O complete eye exam     2 years, Dr. Thompson Caul, at [facility]    History of dental examination     dentures upper and lower    HTN     Hx of coronary artery bypass graft     2012    Hx of gastric bypass     Hypercholesterolemia     Hyperlipidemia     Hypothyroid     MI (myocardial infarction) (CMS HCC) 2012    Neck problem     herniated cervical disc    Obesity (BMI 30-39.9)     Peripheral vascular disease, unspecified (CMS HCC) 01/03/2021    Solitary nodule of right lobe of thyroid 03/30/2018    Incidental finding on MRI thoracic  spine. [place] ordered.     Stented coronary artery     x4    Stroke (CMS [facility])     2002    Tattoo     Left breast, Right shoulder    Thyroid disorder     Thyroid nodule     Type 2 diabetes mellitus (CMS HCC)     HGA1C 10.4 on 12/19/21    Uncontrolled type 2 diabetes mellitus, without long-term current use of insulin     Xanthelasma      Past Surgical History:   Procedure Laterality Date    ENDOMETRIAL ABLATION  1998    HX ANKLE FRACTURE TX  1990s    Right, [name] procedure    HX CATARACT REMOVAL Bilateral 2013    r and l eyes with implants    HX CESAREAN SECTION  06/20/2000    x3, 11/21/86, 11/10/84    HX CHOLECYSTECTOMY  1988  HX CORONARY ARTERY BYPASS GRAFT  03/31/2011    cardiac, 2 vessel, Dr. Francesco Runner, [facility]    HX CORONARY STENT PLACEMENT  2012    x4 stents    HX GASTRIC SLEEVE  02/2017    [place], [place], Dr. Devin Going HEART CATHETERIZATION  2012    massive heart attack [facility] stents - [facility]    HX THYROID BIOPSY      HX THYROIDECTOMY  101/01/2018    HX TONSILLECTOMY      as child    HX YAG Left 09/20/2013     Outpatient Medications Prior to This Office Visit:  aspirin (ECOTRIN) 81 mg Oral Tablet, Delayed Release (E.C.), Take 1 Tablet (81 mg total) by mouth Once a day  benzonatate (TESSALON) 100 mg Oral Capsule, Take 1 Capsule (100 mg total) by mouth Three times a day as needed for Cough (Patient not taking: Reported on 07/11/2023)  Blood Sugar Diagnostic (ACCU-CHEK GUIDE TEST STRIPS) Does not apply Strip, TEST BLOOD SUGAR THREE TIMES DAILY  Blood-Glucose Meter (ACCU-CHEK GUIDE GLUCOSE METER) Misc, USE AS DIRECTED  clopidogreL (PLAVIX) 75 mg Oral Tablet, TAKE 1 TABLET EVERY DAY  cyanocobalamin (VITAMIN B12) 1,000 mcg/mL Injection Solution, INJECT 1 ML (1,000 MCG TOTAL) UNDER THE SKIN EVERY 30 DAYS  docusate sodium (COLACE) 100 mg Oral Capsule, TAKE 1 CAPSULE TWICE DAILY  empagliflozin (JARDIANCE) 10 mg Oral Tablet, Take 1 Tablet (10 mg total) by mouth Once a day  ergocalciferol, vitamin D2, (DRISDOL) 1,250  mcg (50,000 unit) Oral Capsule, TAKE 1 CAPSULE BY MOUTH EVERY WEEK  ezetimibe (ZETIA) 10 mg Oral Tablet, Take 1 Tablet (10 mg total) by mouth Every evening  fluticasone propionate (FLONASE) 50 mcg/actuation Nasal Spray, Suspension, Administer 2 Sprays into each nostril Once a day (Patient not taking: Reported on 08/11/2023)  furosemide (LASIX) 40 mg Oral Tablet, TAKE 1 TABLET EVERY DAY  HYDROcodone-acetaminophen (NORCO) 10-325 mg Oral Tablet, Take 1 Tablet by mouth Every 8 hours as needed for Pain Reuben Likes with [facility]  Ibuprofen (MOTRIN) 800 mg Oral Tablet, TAKE ONE TABLET BY MOUTH THREE TIMES DAILY AS NEEDED FOR PAIN WITH FOOD  Lancets (ACCU-CHEK SOFTCLIX LANCETS) Misc, USE AS DIRECTED DAILY  levothyroxine (SYNTHROID) 25 mcg Oral Tablet, TAKE 1 TABLET ONE TIME DAILY  metoprolol succinate (TOPROL-XL) 25 mg Oral Tablet Sustained Release 24 hr, Take 0.5 Tablets (12.5 mg total) by mouth Once a day  NARCAN 4 mg/actuation Nasal Spray, Non-Aerosol, PT STATED SHE HAS NARCAN AT HOME BUT IS NOT TAKING IT (Patient not taking: Reported on 08/11/2023)  Needle, Disp, 25 G 25 gauge x 1" Needle, 1 mL Every 30 days  niacin (NIASPAN) 500 mg Oral Tablet Sustained Release 24 hr, TAKE 1 TABLET EVERY DAY  nitroGLYCERIN (NITROSTAT) 0.4 mg Sublingual Tablet, Sublingual, Place 1 Tablet (0.4 mg total) under the tongue Every 5 minutes as needed for Chest pain for 3 doses over 15 minutes  omeprazole (PRILOSEC) 40 mg Oral Capsule, Delayed Release(E.C.), TAKE 1 CAPSULE EVERY DAY  ondansetron (ZOFRAN ODT) 8 mg Oral Tablet, Rapid Dissolve, DISSOLVE 1 TABLET ON THE TONGUE EVERY 8 HOURS AS NEEDED FOR NAUSEA AND VOMITING  OZEMPIC 2 mg/dose (8 mg/3 mL) Subcutaneous Pen Injector, Inject 2 mg under the skin Every 7 days  pioglitazone (ACTOS) 15 mg Oral Tablet, Take 1 Tablet (15 mg total) by mouth Once a day  PRENATAL VITAMIN PLUS LOW IRON 27 mg iron- 1 mg Oral Tablet, TAKE 1 TABLET EVERY DAY  rosuvastatin (CRESTOR)  40 mg Oral  Tablet, Take 1 Tablet (40 mg total) by mouth Once a day TAKE 1 TABLET BY MOUTH ONCE DAILY  venlafaxine (EFFEXOR XR) 37.5 mg Oral Capsule, Sust. Release 24 hr, TAKE 1 CAPSULE ONE TIME DAILY  ZYRTEC-D 5-120 mg Oral Tablet Sustained Release 12 hr, Take 1 Tablet by mouth Once a day    No facility-administered medications prior to visit.     Allergies   Allergen Reactions    Capron Dm [Pyrilamine-Dextromethorphan]  Other Adverse Reaction (Add comment)     LIGHT HEADED    Other      Coban dressing caused skin break down    Tylenol W Codeine [Acetaminophen-Codeine]  Other Adverse Reaction (Add comment)     pt states that one time she had chest pains with this med, but dr. thought it was because she had not eaten     Family Medical History:       Problem Relation (Age of Onset)    Atrial fibrillation Sister    Colon Cancer Father    Diabetes Mother, Maternal Aunt    Heart Disease Sister, Maternal Grandfather    Hypertension (High Blood Pressure) Mother    Lung Cancer Maternal Grandfather    MI <59 years of age Mother            Social History     Tobacco Use    Smoking status: Every Day     Current packs/day: 0.50     Average packs/day: 0.5 packs/day for 43.8 years (21.9 ttl pk-yrs)     Types: Cigarettes     Start date: 80    Smokeless tobacco: Never   Vaping Use    Vaping status: Never Used   Substance Use Topics    Alcohol use: No    Drug use: No       Review of Systems:  Review of systems negative except as noted in HPI.    Physical Exam:  Vitals:    09/11/23 1316   BP: 118/62   Pulse: 68   Resp: 18   Temp: 36.5 C (97.7 F)   SpO2: 97%   Weight: 96.6 kg (213 lb)   Height: 1.676 m (5\' 6" )   BMI: 34.45         Physical Exam  Constitutional:       General: She is not in acute distress.     Appearance: Normal appearance. She is not toxic-appearing.   HENT:      Right Ear: Tympanic membrane, ear canal and external ear normal.      Left Ear: Tympanic membrane, ear canal and external ear normal.      Mouth/Throat:       Mouth: Mucous membranes are moist.      Pharynx: Oropharynx is clear. Uvula midline. No pharyngeal swelling, oropharyngeal exudate or posterior oropharyngeal erythema.      Tonsils: No tonsillar exudate or tonsillar abscesses.   Cardiovascular:      Rate and Rhythm: Normal rate and regular rhythm.      Pulses: Normal pulses.      Heart sounds: Normal heart sounds.   Pulmonary:      Effort: Pulmonary effort is normal. No respiratory distress.      Breath sounds: Normal breath sounds. No wheezing, rhonchi or rales.   Musculoskeletal:      Cervical back: No edema.   Skin:     General: Skin is warm and dry.   Neurological:  Mental Status: She is alert and oriented to person, place, and time. Mental status is at baseline.         Assessment/Plan:  1. Viral URI  Lungs are clear bilaterally. Likely viral in nature given onset x 3 days. No shortness of breath. Prescribed cough medication and Flonase to start using 1-2 times daily.      Follow up: Return if symptoms worsen or fail to improve.    Given indications for return, follow up with PCP, or presentation to the ER. Patient stated their understanding.     Larene Pickett, PA      Patient was evaluated independently of supervising physician.

## 2023-09-11 NOTE — Patient Instructions (Signed)
Start using Flonase 1-2 times daily.

## 2023-09-16 ENCOUNTER — Encounter: Payer: Self-pay | Admitting: Internal Medicine

## 2023-09-16 ENCOUNTER — Ambulatory Visit: Payer: 59 | Admitting: Internal Medicine

## 2023-09-16 VITALS — BP 126/80 | HR 77 | Temp 98.7°F | Ht 66.0 in | Wt 148.8 lb

## 2023-09-16 DIAGNOSIS — G8929 Other chronic pain: Secondary | ICD-10-CM

## 2023-09-16 DIAGNOSIS — K30 Functional dyspepsia: Secondary | ICD-10-CM

## 2023-09-16 DIAGNOSIS — K838 Other specified diseases of biliary tract: Secondary | ICD-10-CM

## 2023-09-16 DIAGNOSIS — K831 Obstruction of bile duct: Secondary | ICD-10-CM

## 2023-09-16 DIAGNOSIS — R109 Unspecified abdominal pain: Secondary | ICD-10-CM | POA: Diagnosis not present

## 2023-09-16 DIAGNOSIS — K219 Gastro-esophageal reflux disease without esophagitis: Secondary | ICD-10-CM

## 2023-09-16 DIAGNOSIS — K529 Noninfective gastroenteritis and colitis, unspecified: Secondary | ICD-10-CM

## 2023-09-16 DIAGNOSIS — K760 Fatty (change of) liver, not elsewhere classified: Secondary | ICD-10-CM

## 2023-09-16 MED ORDER — CHOLESTYRAMINE 4 G PO PACK: 4.0000 g | PACK | Freq: Every day | ORAL | 5 refills | Status: DC

## 2023-09-16 MED ORDER — BUSPIRONE HCL 5 MG PO TABS: 5.0000 mg | ORAL_TABLET | Freq: Three times a day (TID) | ORAL | 11 refills | Status: DC

## 2023-09-16 NOTE — Patient Instructions (Addendum)
For your chronic abdominal pain, I am going to start you on a new medication called BuSpar 5 mg 3 times daily.  After 4 to 6 weeks we can increase up to 10 mg 3 times daily depending on how you respond.  For your chronic diarrhea, I am going to send cholestyramine to take once daily.  You will need to take all your other medications at least 1 hour before this or 4 hours afterwards.  We have room to go up to twice a day if this helps.  You can take Imodium on top of this as needed.  I will refer you to [facility] transplant hepatology in [place] for further evaluation.  Follow-up in 4 weeks.  It was very nice seeing you today.  Dr. Marletta Lor

## 2023-09-16 NOTE — Progress Notes (Signed)
Referring Provider: Richardean Chimera, MD Primary Care Physician:  Richardean Chimera, MD Primary GI:  Dr. Marletta Lor  Chief Complaint  Patient presents with   Follow-up    Follow up on abd pain    HPI:   Ann [name] is a 57 y.o. female who presents to clinic today for follow-up visit.  She has a history of  chronic abdominal pain, chronic intermittent diarrhea, prior metal biliary stent placed in 2012 due to strictured CBD, multiple previous ERCPs outside facility after bile leak s/p cholecystectomy, last ERCP in July 2021 at [facility] with biliary stent pull/balloon sweep with stone removal, dilation. Has seen multiple GI practices. See below for prior evaluations.   Prior evaluation: Starting in 2010 after cholecystectomy: She had a bile leak s/p cholecystectomy, requiring ERCP with stent placement for biliary stricture. She reports multiple ERCPs at outside facility with multiple stents placed. She notes that each time stent would be removed, she would develop abdominal pain. She reports having "8-10 ERCPs". Liver biopsy by Dr. Milinda Antis 2011: non-specific findings. Looking back at LFTs, she has had bumps in transaminases from the upper 200s to 400/600, with bilirubin ranging from 1-3.  2013 had fluctuating transaminases as well but less severe. During hospitalization in [facility] Dec 2015 for acute pancreatitis, peak AST 243, ALT 206, bilirubin normal at 1. A,lk phos 342, improving with supportive care. 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     ERCP in 2012 by Dr. Opal Sidles with moderate stricture of the distal CBD with moderate dilatation of CBD, common hepatic duct, and intrahepatic biliary tree. In Aug 2012, a fully covered metal wall stent (10X 40 mm) was placed. Brushings around 2012 negative for malignancy. When seen by Dr. Lanell Matar at [facility] in 2013, question of secondary  sclerosing cholangitis. EGD/EUS March 2013 normal. Recommended stent removal at that time, but she declined due to fear that pain would recur. She was inpatient in [place] and was assessed by Dr. Dulce Sellar in 2015 with abdominal pain and abnormal CT scan noting thickening of the head of pancreas with non-pathologic sized peripancreatic adenopathy. Felt she may have acute on chronic pancreatitis at that time. She was to follow-up with Dr. Dulce Sellar Jan 2016 but did not do this. CA 19-9 low in 2015   [facility] July 2021. ERCP with biliary stent pull/balloon sweep with stones remove, dilated with 12 mm balloon.    Due to persistent abdominal pain, Feb 2024  CTA negative for chronic mesenteric ischemia.    Celiac serologies negative.    EGD in Dec 2023: gastritis s/p biopsy, normal duodenum.    Declining TCAs. Stating this has caused sedation in the past.   MRI/MRCP 07/25/2023, status post cholecystectomy, postoperative pneumobilia without CBD dilation, mild segmental biliary ductal dilatation anterior right lobe of the liver, unchanged.  Hepatic steatosis  RUQ [place] with elastography 08/26/23 hepatic steatosis, median K PA 2.3.  Today:  Abdominal pain: Primarily epigastric, worse after meals. Occurs every other day on average, moderate to severe, will last approx. 1 hour. Feels similar to pain she experienced with pancreatitis prior. Has lost weight as she has pain after meals. Taking Aciphex. Previously trialed on Bentyl which she states did not help. Previously on pancreatic enzymes which helped some but stopped after a few months. TCAs have caused sedation.   Diarrhea: Occurs every day 1-3 times a day. Will occasionally have formed  stools. Takes imodium every morning.    Past Medical History:  Diagnosis Date   Anxiety    Arthritis    Chronic back pain    Complication of anesthesia    pt states, "i have migraine and high BP after surgery.".   Depression    GERD (gastroesophageal reflux  disease)    Migraines    Pancreatitis     Past Surgical History:  Procedure Laterality Date   BILE DUCT STENT PLACEMENT  07/2011   Dr. Steffanie Dunn: fully covered metal wall stent placed.    BIOPSY  12/05/2022   Procedure: BIOPSY;  Surgeon: Lanelle Bal, DO;  [facility];  Service: Endoscopy;;   CHOLECYSTECTOMY  2010   with bile leak   COLONOSCOPY  2011   [facility]   COLONOSCOPY WITH PROPOFOL N/A 03/02/2018   two 2-3 mm hyperplastic polyps, torturous left colon, mild diverticulosis in rectosigmoid, internal hemorrhoids, colonic biopsies negative   ERCP  2011-2013   multiple, with multiple stent placements/exchanges. Metal wall stent placed in Aug 2012 AND STILL PRESENT AS OF 2019   ESOPHAGOGASTRODUODENOSCOPY (EGD) WITH PROPOFOL N/A 03/02/2018   dysphagia due to benign-appearing esophageal stricture/GERD, s/p dilation, small hiatal hernia, gastritis.    ESOPHAGOGASTRODUODENOSCOPY (EGD) WITH PROPOFOL N/A 12/05/2022   gastritis, overall unrevealing.   SAVORY DILATION N/A 03/02/2018   Procedure: SAVORY DILATION;  Surgeon: West Bali, MD;  [facility];  Service: Endoscopy;  Laterality: N/A;    Current Outpatient Medications  Medication Sig Dispense Refill   ALPRAZolam (XANAX) 1 MG tablet Take 0.5-1 mg by mouth 2 (two) times daily as needed for anxiety or sleep.  0   amphetamine-dextroamphetamine (ADDERALL) 20 MG tablet Take 20 mg by mouth every morning. As needed     RABEprazole (ACIPHEX) 20 MG tablet Take 1 tablet (20 mg total) by mouth 2 (two) times daily before a meal. 60 tablet 5   Rimegepant Sulfate (NURTEC) 75 MG TBDP Take by mouth daily at 6 (six) AM.     Turmeric (QC TUMERIC COMPLEX PO) Take by mouth daily at 6 (six) AM.     No current facility-administered medications for this visit.    Allergies as of 09/16/2023 - Review Complete 07/23/2023  Allergen Reaction Noted   Imitrex [sumatriptan] Anaphylaxis and Rash 06/15/2012   Cymbalta  [duloxetine hcl] Diarrhea, Nausea Only, and Other (See Comments) 02/12/2016   Dicyclomine  03/02/2018   Doxycycline Diarrhea 06/15/2012   Gabapentin Other (See Comments) 02/17/2018   Lyrica [pregabalin] Swelling and Other (See Comments) 06/15/2012   Nucynta [tapentadol]  05/20/2015   Nuvigil [armodafinil] Other (See Comments) 02/12/2016   Other  06/06/2015   Penicillins Other (See Comments) 02/12/2016   Tape  07/24/2019   Amoxicillin-pot clavulanate Diarrhea and Rash 06/15/2012   Butrans [buprenorphine] Hives, Swelling, and Rash 06/15/2012   Ciprofloxacin Diarrhea, Nausea And Vomiting, and Other (See Comments) 02/12/2016    Family History  Problem Relation Age of Onset   Cervical cancer Mother    Heart attack Other    Migraines Neg Hx    Seizures Neg Hx    Dementia Neg Hx    Colon cancer Neg Hx     Social History   Socioeconomic History   Marital status: Married    Spouse name: Onalee Hua   Number of children: 2   Years of education: 11   Highest education level: Not on file  Occupational History   Occupation: Workers comp  Tobacco Use   Smoking  status: Former    Current packs/day: 0.00    Average packs/day: 0.3 packs/day for 10.0 years (2.5 ttl pk-yrs)    Types: Cigarettes    Start date: 12/08/2001    Quit date: 12/09/2011    Years since quitting: 11.7   Smokeless tobacco: Never  Vaping Use   Vaping status: Never Used  Substance and Sexual Activity   Alcohol use: No   Drug use: No   Sexual activity: Yes    Birth control/protection: Post-menopausal  Other Topics Concern   Not on file  Social History Narrative   Lives w/ husband   Caffeine use:  none   Social Determinants of Health   Financial Resource Strain: Not on file  Food Insecurity: Not on file  Transportation Needs: Not on file  Physical Activity: Not on file  Stress: Not on file  Social Connections: Not on file    Subjective: Review of Systems  Constitutional:  Negative for chills and fever.  HENT:   Negative for congestion and hearing loss.   Eyes:  Negative for blurred vision and double vision.  Respiratory:  Negative for cough and shortness of breath.   Cardiovascular:  Negative for chest pain and palpitations.  Gastrointestinal:  Positive for abdominal pain and heartburn. Negative for blood in stool, constipation, diarrhea, melena and vomiting.  Genitourinary:  Negative for dysuria and urgency.  Musculoskeletal:  Negative for joint pain and myalgias.  Skin:  Negative for itching and rash.  Neurological:  Negative for dizziness and headaches.  Psychiatric/Behavioral:  Negative for depression. The patient is not nervous/anxious.      Objective: LMP 05/16/2012  Physical Exam Constitutional:      Appearance: Normal appearance.  HENT:     Head: Normocephalic and atraumatic.  Eyes:     Extraocular Movements: Extraocular movements intact.     Conjunctiva/sclera: Conjunctivae normal.  Cardiovascular:     Rate and Rhythm: Normal rate and regular rhythm.  Pulmonary:     Effort: Pulmonary effort is normal.     Breath sounds: Normal breath sounds.  Abdominal:     General: Bowel sounds are normal.     Palpations: Abdomen is soft.  Musculoskeletal:        General: No swelling. Normal range of motion.     Cervical back: Normal range of motion and neck supple.  Skin:    General: Skin is warm and dry.     Coloration: Skin is not jaundiced.  Neurological:     General: No focal deficit present.     Mental Status: She is alert and oriented to person, place, and time.  Psychiatric:        Mood and Affect: Mood normal.        Behavior: Behavior normal.      Assessment/Plan:  1.  Abdominal pain-significant prior workup per HPI.  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  Will trial on BuSpar 5 mg 3 times daily and see how she does.  Can increase to 10 mg 3 times daily pending clinical course.  2.  Chronic diarrhea-not improved on Imodium.  Will trial on cholestyramine.  Patient counseled to  take medications at least 1 hour before or 4 hours afterwards.  3.  Hepatic steatosis, biliary dilatation, ?PSC-extensive workup prior.  Will refer to hepatology in this regard.  Appreciate their help immensely with this patient.  Follow-up in 4 weeks. 09/16/2023 3:24 PM   Disclaimer: This note was dictated with voice recognition software. Similar sounding words can inadvertently be transcribed and  may not be corrected upon review.

## 2023-09-18 ENCOUNTER — Telehealth: Payer: Self-pay | Admitting: Internal Medicine

## 2023-09-18 NOTE — Telephone Encounter (Signed)
Dr Ann [name], you seen patient this week and wanted her to follow up with you or Ann [name], NP is 3-4 weeks. Patient has refused all appointments offered to her. She can only come in at 4pm or after. I offered her Ann [name]'s first available 4pm on NOV 19th, now she wants to have a virtual appointment with you but has to be between 1230-130.  You have a NEW PATIENT slot at 130 on OCT 30. Can we use that as a virtual appointment for her or no? I told her via MyChart that I needed clarification from you first and I would call her to schedule once I hear from you. Please advise.

## 2023-09-18 NOTE — Telephone Encounter (Signed)
Virtual visit is fine, thanks

## 2023-09-21 NOTE — Telephone Encounter (Signed)
noted 

## 2023-09-22 ENCOUNTER — Encounter (INDEPENDENT_AMBULATORY_CARE_PROVIDER_SITE_OTHER): Payer: Self-pay

## 2023-09-22 ENCOUNTER — Encounter: Payer: Self-pay | Admitting: Internal Medicine

## 2023-09-23 ENCOUNTER — Encounter: Payer: Self-pay | Admitting: Internal Medicine

## 2023-10-01 ENCOUNTER — Ambulatory Visit: Payer: 59 | Admitting: Orthopaedic Surgery

## 2023-10-01 ENCOUNTER — Other Ambulatory Visit: Payer: Self-pay

## 2023-10-01 ENCOUNTER — Encounter: Payer: Self-pay | Admitting: Orthopaedic Surgery

## 2023-10-01 VITALS — Ht 66.0 in | Wt 148.0 lb

## 2023-10-01 DIAGNOSIS — M545 Low back pain, unspecified: Secondary | ICD-10-CM

## 2023-10-02 NOTE — Progress Notes (Signed)
Office Visit Note   Patient: Ann [name]           Date of Birth: 04-05-1966           MRN: [id number] Visit Date: 10/01/2023              Requested by: Royann Shivers, PA-C [address],  [place] [address] PCP: Richardean Chimera, MD   Assessment & Plan: Visit Diagnoses:  1. Pain of lumbar spine     Plan: Patient has multiple stress problems in life including her own health problems, husband, work Catering manager.  She needs to talk with her PCP about starting some antidepressant medication.  She recently started some BuSpar which she thinks might of helped slightly.  In the past patient had MRI scans of the abdomen in August 2024 CT scan in the spring of this year also regular CT abdomen pelvis last year all reviewed which did not show any specific changes in the lumbar spine besides some mild arthritic changes at L4-5 and L5-S1 in the facet joints without foraminal compression or central compression.  I think she needs some help with starting likely some depression medicine.  She can return to see me later on.  We discussed the relationship between depression problems and pain.  Follow-Up Instructions: No follow-ups on file.   Orders:  Orders Placed This Encounter  Procedures   XR Lumbar Spine 2-3 Views   No orders of the defined types were placed in this encounter.     Procedures: No procedures performed   Clinical Data: No additional findings.   Subjective: Chief Complaint  Patient presents with   Lower Back - Pain   Left Hip - Pain   Right Hip - Pain    HPI patient referred by Roma Kayser with problems with chronic back pain and left hip pain.  She rates her pain is 8 out of 10.  She states she has had x-rays and was told she had some arthritis in her hip.  Reviewed January 2024 radiographs read [facility] which were normal for her hips.  Patient states her pain is significant it is hard for her to handle the pain.  She states she used to be in pain  management for her back but stopped going.  She is on Adderall and Xanax.  Last oxycodone prescription was spring 2023.  Patient's got a husband with alcohol problems and cirrhosis who stays at home.  Multiple other stress problems.  She admits she wakes up for alarm goes off problems sleeping.  She has had problems with pancreatitis after her gallbladder surgery had several ERCPs finally and have transfer to [facility].  She may have had some obstruction causing her acute pancreatitis but her pancreatic liver enzymes have returned to normal.  She has had some problems with her diarrhea chronic abdominal pain urinary incontinence.  Patient works in Clinical biochemist.  She also has problems with migraines.  Review of Systems all systems are noncontributory to HPI.  No associated bowel or bladder symptoms.   Objective: Vital Signs: Ht 5\' 6"  (1.676 m)   Wt 148 lb (67.1 kg)   LMP 05/16/2012   BMI 23.89 kg/m   Physical Exam Constitutional:      Appearance: She is well-developed.  HENT:     Head: Normocephalic.     Right Ear: External ear normal.     Left Ear: External ear normal. There is no impacted cerumen.  Eyes:  Pupils: Pupils are equal, round, and reactive to light.  Neck:     Thyroid: No thyromegaly.     Trachea: No tracheal deviation.  Cardiovascular:     Rate and Rhythm: Normal rate.  Pulmonary:     Effort: Pulmonary effort is normal.  Abdominal:     Palpations: Abdomen is soft.  Musculoskeletal:     Cervical back: No rigidity.  Skin:    General: Skin is warm and dry.  Neurological:     Mental Status: She is alert and oriented to person, place, and time.  Psychiatric:        Behavior: Behavior normal.     Ortho Exam pain straight leg raising 90 degrees she has pain with palpation over her lumbar spine sacroiliac joints negative FABER test knee and ankle jerk are intact anterior tib EHL heel and toe walking is intact.  Hip range of motion is full.  Symmetrical.  No  limitation of internal rotation.  Specialty Comments:  No specialty comments available.  Imaging: No results found.   PMFS History: Patient Active Problem List   Diagnosis Date Noted   Biliary stricture 07/23/2023   Abdominal pain 09/30/2022   Chronic diarrhea 12/28/2017   GERD (gastroesophageal reflux disease) 12/28/2017   Neck pain 02/12/2016   Paresthesias 02/12/2016   Vision changes 02/12/2016   History of cervical spine trauma 02/12/2016   Urinary incontinence 02/12/2016   Acute pancreatitis 11/24/2014   Chronic abdominal pain 11/24/2014   Past Medical History:  Diagnosis Date   Anxiety    Arthritis    Chronic back pain    Complication of anesthesia    pt states, "i have migraine and high BP after surgery.".   Depression    GERD (gastroesophageal reflux disease)    Migraines    Pancreatitis     Family History  Problem Relation Age of Onset   Cervical cancer Mother    Heart attack Other    Migraines Neg Hx    Seizures Neg Hx    Dementia Neg Hx    Colon cancer Neg Hx     Past Surgical History:  Procedure Laterality Date   BILE DUCT STENT PLACEMENT  07/2011   Dr. Steffanie Dunn: fully covered metal wall stent placed.    BIOPSY  12/05/2022   Procedure: BIOPSY;  Surgeon: Lanelle Bal, DO;  [facility];  Service: Endoscopy;;   CHOLECYSTECTOMY  2010   with bile leak   COLONOSCOPY  2011   [facility]   COLONOSCOPY WITH PROPOFOL N/A 03/02/2018   two 2-3 mm hyperplastic polyps, torturous left colon, mild diverticulosis in rectosigmoid, internal hemorrhoids, colonic biopsies negative   ERCP  2011-2013   multiple, with multiple stent placements/exchanges. Metal wall stent placed in Aug 2012 AND STILL PRESENT AS OF 2019   ESOPHAGOGASTRODUODENOSCOPY (EGD) WITH PROPOFOL N/A 03/02/2018   dysphagia due to benign-appearing esophageal stricture/GERD, s/p dilation, small hiatal hernia, gastritis.    ESOPHAGOGASTRODUODENOSCOPY (EGD) WITH PROPOFOL N/A  12/05/2022   gastritis, overall unrevealing.   SAVORY DILATION N/A 03/02/2018   Procedure: SAVORY DILATION;  Surgeon: West Bali, MD;  [facility];  Service: Endoscopy;  Laterality: N/A;   Social History   Occupational History   Occupation: Workers comp  Tobacco Use   Smoking status: Former    Current packs/day: 0.00    Average packs/day: 0.3 packs/day for 10.0 years (2.5 ttl pk-yrs)    Types: Cigarettes    Start date: 12/08/2001  Quit date: 12/09/2011    Years since quitting: 11.8   Smokeless tobacco: Never  Vaping Use   Vaping status: Never Used  Substance and Sexual Activity   Alcohol use: No   Drug use: No   Sexual activity: Yes    Birth control/protection: Post-menopausal

## 2023-10-07 ENCOUNTER — Ambulatory Visit (INDEPENDENT_AMBULATORY_CARE_PROVIDER_SITE_OTHER): Payer: Self-pay | Admitting: Surgery

## 2023-10-07 ENCOUNTER — Other Ambulatory Visit: Payer: Self-pay

## 2023-10-07 ENCOUNTER — Ambulatory Visit: Payer: 59 | Admitting: Internal Medicine

## 2023-10-07 ENCOUNTER — Other Ambulatory Visit: Payer: Self-pay | Admitting: *Deleted

## 2023-10-07 ENCOUNTER — Telehealth: Payer: 59 | Admitting: Internal Medicine

## 2023-10-07 VITALS — Ht 66.0 in | Wt 148.0 lb

## 2023-10-07 DIAGNOSIS — K831 Obstruction of bile duct: Secondary | ICD-10-CM

## 2023-10-07 DIAGNOSIS — G8929 Other chronic pain: Secondary | ICD-10-CM

## 2023-10-07 DIAGNOSIS — R1010 Upper abdominal pain, unspecified: Secondary | ICD-10-CM | POA: Diagnosis not present

## 2023-10-07 DIAGNOSIS — K760 Fatty (change of) liver, not elsewhere classified: Secondary | ICD-10-CM

## 2023-10-07 DIAGNOSIS — K30 Functional dyspepsia: Secondary | ICD-10-CM | POA: Diagnosis not present

## 2023-10-07 DIAGNOSIS — R109 Unspecified abdominal pain: Secondary | ICD-10-CM

## 2023-10-07 DIAGNOSIS — K219 Gastro-esophageal reflux disease without esophagitis: Secondary | ICD-10-CM

## 2023-10-07 MED ORDER — COLESTIPOL HCL 1 G PO TABS: 1.0000 g | ORAL_TABLET | Freq: Two times a day (BID) | ORAL | 11 refills | Status: DC

## 2023-10-07 NOTE — Patient Instructions (Signed)
I am happy to hear that you are feeling much better.  Continue on BuSpar 5 mg 3 times daily.  I will change your cholestyramine to tablet form.  I will have Mindy check in on your referral to hepatology in [place].  Follow-up in 3 months or sooner if needed.  We can perform this virtually.  It was very nice talking you today.  Dr. Marletta Lor

## 2023-10-07 NOTE — Progress Notes (Signed)
Primary Care Physician:  Richardean Chimera, MD   Patient Location: Home   Provider Location: [facility] office   Reason for Visit: Follow up visit   Total time (minutes) spent on medical discussion: >21 minutes   Visit was conducted using telephone method.  Visit was requested by patient.  Virtual Visit via MyChart Telephone Note I connected with Ann [name] on 10/07/23 at  1:30 PM EDT by telephone and verified that I am speaking with the correct person using two identifiers.   I discussed the limitations, risks, security and privacy concerns of performing an evaluation and management service by telephone and the availability of in person appointments. I also discussed with the patient that there may be a patient responsible charge related to this service. The patient expressed understanding and agreed to proceed.  Chief Complaint  Patient presents with   Follow-up    Patient here today for a follow up visit. Patient says she is still having some RUQ pain, but other than this she has no other current gi issues.      History of Present Illness: Ann [name] is a 57 y.o. female who presents today for follow-up visit.  She has a history of  chronic abdominal pain, chronic intermittent diarrhea, prior metal biliary stent placed in 2012 due to strictured CBD, multiple previous ERCPs outside facility after bile leak s/p cholecystectomy, last ERCP in July 2021 at [facility] with biliary stent pull/balloon sweep with stone removal, dilation. Has seen multiple GI practices. See below for prior evaluations.    Starting in 2010 after cholecystectomy: She had a bile leak s/p cholecystectomy, requiring ERCP with stent placement for biliary stricture. She reports multiple ERCPs at outside facility with multiple stents placed. She notes that each time stent would be removed, she would develop abdominal pain. She reports having "8-10 ERCPs". Liver biopsy by Dr. Milinda Antis 2011: non-specific findings.  Looking back at LFTs, she has had bumps in transaminases from the upper 200s to 400/600, with bilirubin ranging from 1-3.  2013 had fluctuating transaminases as well but less severe. During hospitalization in [facility] Dec 2015 for acute pancreatitis, peak AST 243, ALT 206, bilirubin normal at 1. A,lk phos 342, improving with supportive care. 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     ERCP in 2012 by Dr. Opal Sidles with moderate stricture of the distal CBD with moderate dilatation of CBD, common hepatic duct, and intrahepatic biliary tree. In Aug 2012, a fully covered metal wall stent (10X 40 mm) was placed. Brushings around 2012 negative for malignancy. When seen by Dr. Lanell Matar at [facility] in 2013, question of secondary sclerosing cholangitis. EGD/EUS March 2013 normal. Recommended stent removal at that time, but she declined due to fear that pain would recur. She was inpatient in [place] and was assessed by Dr. Dulce Sellar in 2015 with abdominal pain and abnormal CT scan noting thickening of the head of pancreas with non-pathologic sized peripancreatic adenopathy. Felt she may have acute on chronic pancreatitis at that time. She was to follow-up with Dr. Dulce Sellar Jan 2016 but did not do this. CA 19-9 low in 2015   [facility] July 2021. ERCP with biliary stent pull/balloon sweep with stones remove, dilated with 12 mm balloon.    Due to persistent abdominal pain, Feb 2024  CTA negative for chronic mesenteric ischemia.    Celiac serologies negative.    EGD in Dec  2023: gastritis s/p biopsy, normal duodenum.    Declining TCAs. Stating this has caused sedation in the past.    MRI/MRCP 07/25/2023, status post cholecystectomy, postoperative pneumobilia without CBD dilation, mild segmental biliary ductal dilatation anterior right lobe of the liver, unchanged.  Hepatic steatosis   RUQ [place] with elastography  08/26/23 hepatic steatosis, median K PA 2.3.   Today:   Abdominal pain: Primarily epigastric, worse after meals. Occurs every other day on average, moderate to severe, will last approx. 1 hour. Feels similar to pain she experienced with pancreatitis prior. Has lost weight as she has pain after meals. Taking Aciphex. Previously trialed on Bentyl which she states did not help. Previously on pancreatic enzymes which helped some but stopped after a few months. TCAs have caused sedation.   On previous visit, I started her on BuSpar 5 mg 3 times daily.  She states today that her symptoms are 95% improved.  She is quite happy and excited in regards to these results.  Continues to have intermittent right upper quadrant pain on occasion though symptoms overall are vastly improved.   Diarrhea: Occurs every day 1-3 times a day. Will occasionally have formed stools.  Previously taking Imodium every morning.  I started her on cholestyramine daily and she states that her diarrhea is also improved.  She states she only takes this once every 2 to 3 days or else will lead to constipation.  She is inquiring about tablet formation.  Past Medical History:  Diagnosis Date   Anxiety    Arthritis    Chronic back pain    Complication of anesthesia    pt states, "i have migraine and high BP after surgery.".   Depression    GERD (gastroesophageal reflux disease)    Migraines    Pancreatitis      Past Surgical History:  Procedure Laterality Date   BILE DUCT STENT PLACEMENT  07/2011   Dr. Steffanie Dunn: fully covered metal wall stent placed.    BIOPSY  12/05/2022   Procedure: BIOPSY;  Surgeon: Lanelle Bal, DO;  [facility]: [facility];  Service: Endoscopy;;   CHOLECYSTECTOMY  2010   with bile leak   COLONOSCOPY  2011   [facility]   COLONOSCOPY WITH PROPOFOL N/A 03/02/2018   two 2-3 mm hyperplastic polyps, torturous left colon, mild diverticulosis in rectosigmoid, internal hemorrhoids, colonic  biopsies negative   ERCP  2011-2013   multiple, with multiple stent placements/exchanges. Metal wall stent placed in Aug 2012 AND STILL PRESENT AS OF 2019   ESOPHAGOGASTRODUODENOSCOPY (EGD) WITH PROPOFOL N/A 03/02/2018   dysphagia due to benign-appearing esophageal stricture/GERD, s/p dilation, small hiatal hernia, gastritis.    ESOPHAGOGASTRODUODENOSCOPY (EGD) WITH PROPOFOL N/A 12/05/2022   gastritis, overall unrevealing.   SAVORY DILATION N/A 03/02/2018   Procedure: SAVORY DILATION;  Surgeon: West Bali, MD;  Location: [facility];  Service: Endoscopy;  Laterality: N/A;     Current Meds  Medication Sig   ALPRAZolam (XANAX) 1 MG tablet Take 0.5-1 mg by mouth 2 (two) times daily as needed for anxiety or sleep.   amphetamine-dextroamphetamine (ADDERALL) 20 MG tablet Take 20 mg by mouth every morning. As needed   busPIRone (BUSPAR) 5 MG tablet Take 1 tablet (5 mg total) by mouth 3 (three) times daily.   cholestyramine (QUESTRAN) 4 g packet Take 1 packet (4 g total) by mouth daily. Mix with 4-6 oz liquid.  Take other meds 1 hr before or 4-6 hr after cholestyramine.   ezetimibe (ZETIA)  10 MG tablet Take 10 mg by mouth daily.   ondansetron (ZOFRAN-ODT) 8 MG disintegrating tablet Take 8 mg by mouth every 8 (eight) hours as needed for nausea or vomiting.   Rimegepant Sulfate (NURTEC) 75 MG TBDP Take by mouth daily at 6 (six) AM.     Family History  Problem Relation Age of Onset   Cervical cancer Mother    Heart attack Other    Migraines Neg Hx    Seizures Neg Hx    Dementia Neg Hx    Colon cancer Neg Hx     Social History   Socioeconomic History   Marital status: Married    Spouse name: Onalee Hua   Number of children: 2   Years of education: 11   Highest education level: Not on file  Occupational History   Occupation: Workers comp  Tobacco Use   Smoking status: Former    Current packs/day: 0.00    Average packs/day: 0.3 packs/day for 10.0 years (2.5 ttl pk-yrs)    Types:  Cigarettes    Start date: 12/08/2001    Quit date: 12/09/2011    Years since quitting: 11.8   Smokeless tobacco: Never  Vaping Use   Vaping status: Never Used  Substance and Sexual Activity   Alcohol use: No   Drug use: No   Sexual activity: Yes    Birth control/protection: Post-menopausal  Other Topics Concern   Not on file  Social History Narrative   Lives w/ husband   Caffeine use:  none   Social Determinants of Health   Financial Resource Strain: Not on file  Food Insecurity: Not on file  Transportation Needs: Not on file  Physical Activity: Not on file  Stress: Not on file  Social Connections: Not on file       Review of Systems: Gen: Denies fever, chills, anorexia. Denies fatigue, weakness, weight loss.  CV: Denies chest pain, palpitations, syncope, peripheral edema, and claudication. Resp: Denies dyspnea at rest, cough, wheezing, coughing up blood, and pleurisy. GI: see HPI Derm: Denies rash, itching, dry skin Psych: Denies depression, anxiety, memory loss, confusion. No homicidal or suicidal ideation.  Heme: Denies bruising, bleeding, and enlarged lymph nodes.  Observations/Objective: No distress. Unable to perform physical exam due to telephone encounter.   Assessment and Plan:  1.  Abdominal pain-significant prior workup per HPI.  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  Vastly improved on BuSpar 5 mg 3 times daily.  Will continue.   2.  Chronic diarrhea-improved on cholestyramine though having issues with the powder itself.  Will trial on tablet formation and see how she does.  Sent to pharmacy.   3.  Hepatic steatosis, biliary dilatation, ?PSC-extensive workup prior.  Will refer to hepatology in this regard.  Appreciate their help immensely with this patient.   Follow-up in 3 months or sooner if needed  Follow Up Instructions: I discussed the assessment and treatment plan with the patient. The patient was provided an opportunity to ask questions and all were  answered. The patient agreed with the plan and demonstrated an understanding of the instructions.   The patient was advised to call back or seek an in-person evaluation if the symptoms worsen or if the condition fails to improve as anticipated.  I provided >21 minutes during this MyChart telephone encounter.  Hennie Duos. Jeny Nield D.O. [facility]

## 2023-10-08 ENCOUNTER — Encounter (INDEPENDENT_AMBULATORY_CARE_PROVIDER_SITE_OTHER): Payer: Commercial Managed Care - PPO | Admitting: Family Medicine

## 2023-10-14 ENCOUNTER — Telehealth: Payer: Self-pay

## 2023-10-14 ENCOUNTER — Encounter: Payer: Self-pay | Admitting: Internal Medicine

## 2023-10-14 NOTE — Telephone Encounter (Signed)
Dr Marletta Lor,  Ann [name]:  This is a note before I forward you a MyChart massage on this pt. She is requesting an increase of her Buspirone 5mg  to 10mg . On 10-08/2023 you gave her this Rx with a Qty of 90 and 11 refills. Pt is taking this 3 times a day. She stated at that visit her symptoms are 95% improved.

## 2023-10-15 ENCOUNTER — Other Ambulatory Visit: Payer: Self-pay | Admitting: Internal Medicine

## 2023-10-15 MED ORDER — BUSPIRONE HCL 10 MG PO TABS: 10.0000 mg | ORAL_TABLET | Freq: Three times a day (TID) | ORAL | 11 refills | Status: DC

## 2023-10-24 NOTE — Progress Notes (Incomplete)
[name]  [address]  [place] [address]      Progress Note      Name: Monique [name] MRN:  [id number]   Date: 10/26/2023 DOB:  [birth year] (57 y.o.)     Date of Birth: August 09, 1966      Chief Complaint: No chief complaint on file.      HPI: Monique [name] is a 57 y.o. female new patient presenting for the above stated complaint. No further associated symptoms or complaints are reported at this time.    Review of Systems:  Review of Systems   Constitutional: Negative.    Respiratory: Negative.     Musculoskeletal: Negative.    Skin: Negative.      Past Medical History:  Past Medical History:   Diagnosis Date    Anxiety     Arthritis     Back problem     DDD    Beta blocker prescribed for left ventricular systolic dysfunction     Blood thinned due to long-term anticoagulant use     Carotid stenosis     occlusion of left ICA    Chest pain 04/03/2018    Chronic back pain     Chronic obstructive pulmonary disease, unspecified COPD type (CMS HCC) 12/31/2022    Coronary artery disease involving native coronary artery of native heart     DDD (degenerative disc disease), lumbar     "entire spine"    Ear piercing     GERD (gastroesophageal reflux disease)     controlled with omeprazole    H/O complete eye exam     2 years, Dr. 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 [place] ordered.     Stented coronary artery     x4    Stroke (CMS [facility])     2002    Tattoo     Left breast, Right shoulder    Thyroid disorder     Thyroid nodule      Type 2 diabetes mellitus (CMS HCC)     HGA1C 10.4 on 12/19/21    Uncontrolled type 2 diabetes mellitus, without long-term current use of insulin     Xanthelasma          Past Surgical History:   Past Surgical History:   Procedure Laterality Date    Endometrial ablation  1998    Hx ankle fracture tx  1990s    Hx cataract removal Bilateral 2013    Hx cesarean section  06/20/2000    Hx cholecystectomy  1988    Hx coronary artery bypass graft  03/31/2011    Hx coronary stent placement  2012    Hx gastric sleeve  02/2017    Hx heart catheterization  2012    Hx thyroid biopsy      Hx thyroidectomy  101/01/2018  Hx tonsillectomy      Hx yag Left 09/20/2013     Allergies:  Allergies   Allergen Reactions    Capron Dm [Pyrilamine-Dextromethorphan]  Other Adverse Reaction (Add comment)     LIGHT HEADED    Other      Coban dressing caused skin break down    Tylenol W Codeine [Acetaminophen-Codeine]  Other Adverse Reaction (Add comment)     pt states that one time she had chest pains with this med, but dr. thought it was because she had not eaten     Medications:  Current Outpatient Medications   Medication Sig    aspirin (ECOTRIN) 81 mg Oral Tablet, Delayed Release (E.C.) Take 1 Tablet (81 mg total) by mouth Once a day    benzonatate (TESSALON) 100 mg Oral Capsule Take 1 Capsule (100 mg total) by mouth Three times a day as needed for Cough (Patient not taking: Reported on 07/11/2023)    Blood Sugar Diagnostic (ACCU-CHEK GUIDE TEST STRIPS) Does not apply Strip TEST BLOOD SUGAR THREE TIMES DAILY    Blood-Glucose Meter (ACCU-CHEK GUIDE GLUCOSE METER) Misc USE AS DIRECTED    clopidogreL (PLAVIX) 75 mg Oral Tablet TAKE 1 TABLET EVERY DAY    cyanocobalamin (VITAMIN B12) 1,000 mcg/mL Injection Solution INJECT 1 ML (1,000 MCG TOTAL) UNDER THE SKIN EVERY 30 DAYS    docusate sodium (COLACE) 100 mg Oral Capsule TAKE 1 CAPSULE TWICE DAILY    empagliflozin (JARDIANCE) 10 mg Oral Tablet Take 1 Tablet (10 mg total) by mouth Once a day     ergocalciferol, vitamin D2, (DRISDOL) 1,250 mcg (50,000 unit) Oral Capsule TAKE 1 CAPSULE BY MOUTH EVERY WEEK    ezetimibe (ZETIA) 10 mg Oral Tablet Take 1 Tablet (10 mg total) by mouth Every evening    fluticasone propionate (FLONASE) 50 mcg/actuation Nasal Spray, Suspension Administer 2 Sprays into each nostril Once a day (Patient not taking: Reported on 08/11/2023)    furosemide (LASIX) 40 mg Oral Tablet TAKE 1 TABLET EVERY DAY    HYDROcodone-acetaminophen (NORCO) 10-325 mg Oral Tablet Take 1 Tablet by mouth Every 8 hours as needed for Pain Reuben Likes with [facility]    Ibuprofen (MOTRIN) 800 mg Oral Tablet TAKE ONE TABLET BY MOUTH THREE TIMES DAILY AS NEEDED FOR PAIN WITH FOOD    Lancets (ACCU-CHEK SOFTCLIX LANCETS) Misc USE AS DIRECTED DAILY    levothyroxine (SYNTHROID) 25 mcg Oral Tablet TAKE 1 TABLET ONE TIME DAILY    metoprolol succinate (TOPROL-XL) 25 mg Oral Tablet Sustained Release 24 hr Take 0.5 Tablets (12.5 mg total) by mouth Once a day    NARCAN 4 mg/actuation Nasal Spray, Non-Aerosol PT STATED SHE HAS NARCAN AT HOME BUT IS NOT TAKING IT (Patient not taking: Reported on 08/11/2023)    Needle, Disp, 25 G 25 gauge x 1" Needle 1 mL Every 30 days    niacin (NIASPAN) 500 mg Oral Tablet Sustained Release 24 hr TAKE 1 TABLET EVERY DAY    nitroGLYCERIN (NITROSTAT) 0.4 mg Sublingual Tablet, Sublingual Place 1 Tablet (0.4 mg total) under the tongue Every 5 minutes as needed for Chest pain for 3 doses over 15 minutes    omeprazole (PRILOSEC) 40 mg Oral Capsule, Delayed Release(E.C.) TAKE 1 CAPSULE EVERY DAY    ondansetron (ZOFRAN ODT) 8 mg Oral Tablet, Rapid Dissolve DISSOLVE 1 TABLET ON THE TONGUE EVERY 8 HOURS AS NEEDED FOR NAUSEA AND VOMITING    OZEMPIC 2 mg/dose (8 mg/3 mL) Subcutaneous Pen Injector Inject 2  mg under the skin Every 7 days    pioglitazone (ACTOS) 15 mg Oral Tablet Take 1 Tablet (15 mg total) by mouth Once a day    PRENATAL VITAMIN PLUS LOW IRON 27 mg iron- 1 mg Oral Tablet  TAKE 1 TABLET EVERY DAY    rosuvastatin (CRESTOR) 40 mg Oral Tablet Take 1 Tablet (40 mg total) by mouth Once a day TAKE 1 TABLET BY MOUTH ONCE DAILY    venlafaxine (EFFEXOR XR) 37.5 mg Oral Capsule, Sust. Release 24 hr TAKE 1 CAPSULE ONE TIME DAILY    ZYRTEC-D 5-120 mg Oral Tablet Sustained Release 12 hr Take 1 Tablet by mouth Once a day     Family History:  Family Medical History:       Problem Relation (Age of Onset)    Atrial fibrillation Sister    Colon Cancer Father    Diabetes Mother, Maternal Aunt    Heart Disease Sister, Maternal Grandfather    Hypertension (High Blood Pressure) Mother    Lung Cancer Maternal Grandfather    MI <38 years of age Mother            Social History:  Social History     Socioeconomic History    Marital status: Married     Spouse name: Harvie Heck    Number of children: 3    Years of education: completed 11th grade   Occupational History    Occupation: disabled   Tobacco Use    Smoking status: Every Day     Current packs/day: 0.50     Average packs/day: 0.5 packs/day for 43.9 years (21.9 ttl pk-yrs)     Types: Cigarettes     Start date: 38    Smokeless tobacco: Never   Vaping Use    Vaping status: Never Used   Substance and Sexual Activity    Alcohol use: No    Drug use: No    Sexual activity: Yes     Partners: Male   Other Topics Concern    Ability to Walk 1 Flight of Steps without SOB/CP Yes    Routine Exercise No    Ability to Walk 2 Flight of Steps without SOB/CP Yes    Ability To Do Own ADL's Yes    Uses Walker No    Other Activity Level Yes     Comment: on the go all the time - does cleaning, grocery shopping    Uses Cane No   Social History Narrative    Living situation: lives at home with husband, Harvie Heck, and daughter.  1 dog named Duke and 1 cat named Lucky.    Nutrition:  Eats 5 small meals d/t gastric sleeve surgery.     Caffeine use: none, decaf coffee    Exercise: stays busy around house. No formal form of exercise    Seatbelt use: sometimes    Fire extinguishers in the  home: yes    Smoke alarms in the home: yes    Carbon monoxide detectors in the home: yes    Wynelle Link exposure: sunglasses        Tarae would like for husband, Imanii Rood,  to speak for her in the event she would become incapacitated.    Full code.      Social Determinants of Health     Social Connections: Medium Risk (02/17/2023)    Social Connections     SDOH Social Isolation: 3 to 5 times a week       Objective:  LMP  (  LMP Unknown)       There is no height or weight on file to calculate BMI.    Physical Exam:  Physical Exam  Constitutional:       General: She is not in acute distress.  HENT:      Head: Normocephalic and atraumatic.   Pulmonary:      Effort: Pulmonary effort is normal.      Breath sounds: Normal breath sounds.   Musculoskeletal:         General: Normal range of motion.      Cervical back: Normal range of motion.   Skin:     General: Skin is warm and dry.   Neurological:      General: No focal deficit present.      Mental Status: She is oriented to person, place, and time.       Laboratory Studies/Data Reviewed:  I have reviewed all available imagining and laboratory studies as appropriate.  No visits with results within 1 Month(s) from this visit.   Latest known visit with results is:   Appointment on 08/11/2023   Component Date Value Ref Range Status    SODIUM 08/11/2023 140  136 - 145 mmol/L Final    POTASSIUM 08/11/2023 3.7  3.5 - 5.1 mmol/L Final    CHLORIDE 08/11/2023 110  96 - 111 mmol/L Final    CO2 TOTAL 08/11/2023 25  22 - 30 mmol/L Final    ANION GAP 08/11/2023 5  4 - 13 mmol/L Final    CALCIUM 08/11/2023 9.1  8.6 - 10.2 mg/dL Final    Gadolinium-containing contrast can interfere with calcium measurement.      GLUCOSE 08/11/2023 159 (H)  65 - 125 mg/dL Final    BUN 40/98/1191 8  8 - 25 mg/dL Final    CREATININE 47/82/9562 0.86  0.60 - 1.05 mg/dL Final    BUN/CREA RATIO 08/11/2023 9  6 - 22 Final    ESTIMATED GFR - FEMALE 08/11/2023 78  >=60 mL/min/BSA Final    Estimated Glomerular Filtration  Rate (eGFR) is calculated using the gender-dependent CKD-EPI (2021) equation, intended for patients 57 years of age and older.    If patient sex is not documented, or if patient sex and gender are incongruent, eGFR results calculated for both female and female will be reported. Recommend correlation and careful interpretation of patient history, keeping in mind that serum creatinine is influenced by hormone therapy.    Stage, GFR, Classification   G1, 90, Normal or High   G2, 60-89, Mildly decreased   G3a, 45-59, Mildly to moderately decreased   G3b, 30-44, Moderately to severely decreased   G4, 15-29, Severely decreased   G5, <15, Kidney failure   In the absence of kidney damage, neither G1 or G2 fulfill criteria for CKD per KDIGO.      HEMOGLOBIN A1C 08/11/2023 8.9 (H)  <5.7 % Final    Hemoglobin A1c Ranges  Non-Diabetic:  <5.7  Increased Risk (pre-diabetic) for impaired glucose tolerance:  5.7 - 6.4  Consistent with diabetes (in not previously established diabetic patient:  > or = 6.5      CREATININE RANDOM URINE 08/11/2023 54  No reference intervals are established mg/dL Final    Urine creatinine is used to adjust for specimen concentration or dilution. It does not necessarily speak to abnormality in a random urine specimen.      MICROALBUMIN RANDOM URINE 08/11/2023 <0.5  No reference intervals are established mg/dL Final    MICROALBUMIN/CREATININE  RATIO RAND* 08/11/2023    Final    Microalbuminuria, 30 - 299 mg/g  Clinical albuminuria, >=300 mg/g  Calculation not performed         Encounter Medications and Orders  No orders of the defined types were placed in this encounter.      Imaging:  ***    Plan:   ***      Injection(s):  ***    Diagnosis:    ICD-10-CM    1. Chronic pain of left knee  M25.562     G89.29          I am scribing for, and in the presence of, Dr. Antoine Primas for services provided on 10/26/2023.  Lianne Cure, SCRIBE   Don't forget .tgscribe AND .sign      This note was partially generated  using MModal Fluency Direct system, and there may be some incorrect words, spellings, and punctuation that were not noted in checking the note before saving.

## 2023-10-26 ENCOUNTER — Ambulatory Visit (INDEPENDENT_AMBULATORY_CARE_PROVIDER_SITE_OTHER): Payer: Self-pay

## 2023-10-26 ENCOUNTER — Ambulatory Visit (INDEPENDENT_AMBULATORY_CARE_PROVIDER_SITE_OTHER): Payer: Self-pay | Admitting: Specialist

## 2023-10-26 DIAGNOSIS — G8929 Other chronic pain: Secondary | ICD-10-CM

## 2023-10-27 ENCOUNTER — Encounter (INDEPENDENT_AMBULATORY_CARE_PROVIDER_SITE_OTHER): Payer: Commercial Managed Care - PPO | Admitting: Radiology

## 2023-10-27 ENCOUNTER — Encounter (INDEPENDENT_AMBULATORY_CARE_PROVIDER_SITE_OTHER): Payer: Self-pay

## 2023-10-27 ENCOUNTER — Ambulatory Visit (INDEPENDENT_AMBULATORY_CARE_PROVIDER_SITE_OTHER): Payer: Commercial Managed Care - PPO

## 2023-10-27 ENCOUNTER — Other Ambulatory Visit: Payer: Self-pay

## 2023-10-27 ENCOUNTER — Ambulatory Visit: Payer: 59 | Admitting: Gastroenterology

## 2023-10-27 VITALS — BP 132/70 | HR 87 | Temp 97.2°F | Resp 18 | Ht 66.0 in | Wt 213.0 lb

## 2023-10-27 DIAGNOSIS — S62637A Displaced fracture of distal phalanx of left little finger, initial encounter for closed fracture: Secondary | ICD-10-CM

## 2023-10-27 DIAGNOSIS — W1841XA Slipping, tripping and stumbling without falling due to stepping on object, initial encounter: Secondary | ICD-10-CM

## 2023-10-27 DIAGNOSIS — M79642 Pain in left hand: Secondary | ICD-10-CM

## 2023-10-27 NOTE — Progress Notes (Signed)
URGENT CARE, [facility] U[facility]ENT [facility]ARE  [address]  [place] [address]    History and Physical    Name: Monique [name] MRN:  [id number]   Date: 10/27/2023 DOB:  [birth year] (57 y.o.)              Subjective:     Chief Complaint:    Chief Complaint   Patient presents with    Hand Injury     Tripped over pack of shingles and tried to catch herself and injured left 5th finger and thumb this evening        HPI:  Monique [name] is a 57 y.o. y/o female who presents today for left hand pain.  Patient states that she was getting out of a vehicle when she tripped over a pack a shingles and started to fall backwards.  As she was starting to fall backwards she reached her arm back to catch herself and hit her arm against her husband shoulder.  She reports pain along her 5th digit which radiates all the way to her 1st digit.    Past Medical History:   Diagnosis Date    Anxiety     Arthritis     Back problem     DDD    Beta blocker prescribed for left ventricular systolic dysfunction     Blood thinned due to long-term anticoagulant use     Carotid stenosis     occlusion of left ICA    Chest pain 04/03/2018    Chronic back pain     Chronic obstructive pulmonary disease, unspecified COPD type (CMS HCC) 12/31/2022    Coronary artery disease involving native coronary artery of native heart     DDD (degenerative disc disease), lumbar     "entire spine"    Ear piercing     GERD (gastroesophageal reflux disease)     controlled with omeprazole    H/O complete eye exam     2 years, Dr. Thompson Caul, at [facility]    History of dental examination     dentures upper and lower    HTN     Hx of coronary artery bypass graft     2012    Hx of gastric bypass     Hypercholesterolemia     Hyperlipidemia     Hypothyroid     MI (myocardial infarction) (CMS HCC) 2012    Neck problem     herniated cervical disc    Obesity (BMI 30-39.9)     Peripheral vascular disease, unspecified (CMS HCC) 01/03/2021    Solitary nodule of right lobe of thyroid  03/30/2018    Incidental finding on MRI thoracic spine. [place] ordered.     Stented coronary artery     x4    Stroke (CMS [facility])     2002    Tattoo     Left breast, Right shoulder    Thyroid disorder     Thyroid nodule     Type 2 diabetes mellitus (CMS HCC)     HGA1C 10.4 on 12/19/21    Uncontrolled type 2 diabetes mellitus, without long-term current use of insulin     Xanthelasma           Past Surgical History:   Procedure Laterality Date    ENDOMETRIAL ABLATION  1998    HX ANKLE FRACTURE TX  1990s    Right, [name] procedure    HX CATARACT REMOVAL Bilateral 2013    r and l  eyes with implants    HX CESAREAN SECTION  06/20/2000    x3, 11/21/86, 11/10/84    HX CHOLECYSTECTOMY  1988    HX CORONARY ARTERY BYPASS GRAFT  03/31/2011    cardiac, 2 vessel, Dr. Francesco Runner, [facility]    HX CORONARY STENT PLACEMENT  2012    x4 stents    HX GASTRIC SLEEVE  02/2017    [place], [place], Dr. Devin Going HEART CATHETERIZATION  2012    massive heart attack and stents - [facility]    HX THYROID BIOPSY      HX THYROIDECTOMY  101/01/2018    HX TONSILLECTOMY      as child    HX YAG Left 09/20/2013            Current Outpatient Medications   Medication Sig    aspirin (ECOTRIN) 81 mg Oral Tablet, Delayed Release (E.C.) Take 1 Tablet (81 mg total) by mouth Once a day    benzonatate (TESSALON) 100 mg Oral Capsule Take 1 Capsule (100 mg total) by mouth Three times a day as needed for Cough (Patient not taking: Reported on 07/11/2023)    Blood Sugar Diagnostic (ACCU-CHEK GUIDE TEST STRIPS) Does not apply Strip TEST BLOOD SUGAR THREE TIMES DAILY    Blood-Glucose Meter (ACCU-CHEK GUIDE GLUCOSE METER) Misc USE AS DIRECTED    clopidogreL (PLAVIX) 75 mg Oral Tablet TAKE 1 TABLET EVERY DAY    cyanocobalamin (VITAMIN B12) 1,000 mcg/mL Injection Solution INJECT 1 ML (1,000 MCG TOTAL) UNDER THE SKIN EVERY 30 DAYS    docusate sodium (COLACE) 100 mg Oral Capsule TAKE 1 CAPSULE TWICE DAILY    empagliflozin (JARDIANCE) 10 mg Oral Tablet Take 1 Tablet (10 mg total)  by mouth Once a day    ergocalciferol, vitamin D2, (DRISDOL) 1,250 mcg (50,000 unit) Oral Capsule TAKE 1 CAPSULE BY MOUTH EVERY WEEK    ezetimibe (ZETIA) 10 mg Oral Tablet Take 1 Tablet (10 mg total) by mouth Every evening    fluticasone propionate (FLONASE) 50 mcg/actuation Nasal Spray, Suspension Administer 2 Sprays into each nostril Once a day (Patient not taking: Reported on 08/11/2023)    furosemide (LASIX) 40 mg Oral Tablet TAKE 1 TABLET EVERY DAY    HYDROcodone-acetaminophen (NORCO) 10-325 mg Oral Tablet Take 1 Tablet by mouth Every 8 hours as needed for Pain Reuben Likes with [facility]    Ibuprofen (MOTRIN) 800 mg Oral Tablet TAKE ONE TABLET BY MOUTH THREE TIMES DAILY AS NEEDED FOR PAIN WITH FOOD    Lancets (ACCU-CHEK SOFTCLIX LANCETS) Misc USE AS DIRECTED DAILY    levothyroxine (SYNTHROID) 25 mcg Oral Tablet TAKE 1 TABLET ONE TIME DAILY    metoprolol succinate (TOPROL-XL) 25 mg Oral Tablet Sustained Release 24 hr Take 0.5 Tablets (12.5 mg total) by mouth Once a day    NARCAN 4 mg/actuation Nasal Spray, Non-Aerosol PT STATED SHE HAS NARCAN AT HOME BUT IS NOT TAKING IT (Patient not taking: Reported on 08/11/2023)    Needle, Disp, 25 G 25 gauge x 1" Needle 1 mL Every 30 days    niacin (NIASPAN) 500 mg Oral Tablet Sustained Release 24 hr TAKE 1 TABLET EVERY DAY    nitroGLYCERIN (NITROSTAT) 0.4 mg Sublingual Tablet, Sublingual Place 1 Tablet (0.4 mg total) under the tongue Every 5 minutes as needed for Chest pain for 3 doses over 15 minutes    omeprazole (PRILOSEC) 40 mg Oral Capsule, Delayed Release(E.C.) TAKE 1 CAPSULE EVERY DAY    ondansetron (ZOFRAN  ODT) 8 mg Oral Tablet, Rapid Dissolve DISSOLVE 1 TABLET ON THE TONGUE EVERY 8 HOURS AS NEEDED FOR NAUSEA AND VOMITING    OZEMPIC 2 mg/dose (8 mg/3 mL) Subcutaneous Pen Injector Inject 2 mg under the skin Every 7 days    pioglitazone (ACTOS) 15 mg Oral Tablet Take 1 Tablet (15 mg total) by mouth Once a day    PRENATAL VITAMIN PLUS LOW IRON 27 mg  iron- 1 mg Oral Tablet TAKE 1 TABLET EVERY DAY    rosuvastatin (CRESTOR) 40 mg Oral Tablet Take 1 Tablet (40 mg total) by mouth Once a day TAKE 1 TABLET BY MOUTH ONCE DAILY    venlafaxine (EFFEXOR XR) 37.5 mg Oral Capsule, Sust. Release 24 hr TAKE 1 CAPSULE ONE TIME DAILY    ZYRTEC-D 5-120 mg Oral Tablet Sustained Release 12 hr Take 1 Tablet by mouth Once a day      Allergies   Allergen Reactions    Capron Dm [Pyrilamine-Dextromethorphan]  Other Adverse Reaction (Add comment)     LIGHT HEADED    Other      Coban dressing caused skin break down    Tylenol W Codeine [Acetaminophen-Codeine]  Other Adverse Reaction (Add comment)     pt states that one time she had chest pains with this med, but dr. thought it was because she had not eaten        ROS:  Review of Systems   Musculoskeletal:         Per HPI   All other systems reviewed and are negative.       Objective:   Physical Exam  Vitals and nursing note reviewed.   Constitutional:       General: She is not in acute distress.     Appearance: Normal appearance. She is not ill-appearing.   HENT:      Head: Normocephalic.   Pulmonary:      Effort: Pulmonary effort is normal.   Musculoskeletal:      Left hand: Tenderness (along the entire 5th digit and the 2nd MCP) present. Normal capillary refill. Normal pulse.   Skin:     General: Skin is warm and dry.      Capillary Refill: Capillary refill takes less than 2 seconds.   Neurological:      General: No focal deficit present.      Mental Status: She is alert and oriented to person, place, and time.   Psychiatric:         Mood and Affect: Mood normal.         Behavior: Behavior normal.         Vitals:    10/27/23 1906   BP: 132/70   Pulse: 87   Resp: 18   Temp: 36.2 C (97.2 F)   SpO2: 97%   Weight: 96.6 kg (213 lb)   Height: 1.676 m (5\' 6" )   BMI: 34.38       XR HAND LEFT  Narrative: Female, 57 years old.    XR HAND LEFT performed on 10/27/2023 7:22 PM.    REASON FOR EXAM:  Z61.096: Left hand pain    TECHNIQUE: 3 views/3  images submitted for interpretation.    COMPARISON:  11/16/2017 hand x-ray    FINDINGS:  PA, oblique and lateral imaging of the left hand was performed.    There is a fracture of the posterior cortex of the proximal metaphysis distal phalanx fifth digit left hand. The remainder of the osseous structures and appear  intact.  Impression: 1. Avulsion type fracture involving the posterior cortex of the proximal metaphysis distal phalanx fifth digit left hand. Findings typically associated with tenderness injury.    Radiologist location ID: [id number]     Assessment & Plan:       ICD-10-CM    1. Left hand pain  M79.642 XR HAND LEFT     Refer to [facility]) Orthopedics, POA         Discussed XR results with patient. Placed in finger splint in clinic. Ortho referral placed for follow up.    Orders Placed This Encounter    XR HAND LEFT    Refer to [facility]) Orthopedics, POA      Follow up with PCP as directed. For new or worsening symptoms, evaluation at your nearest Emergency Department may be needed.    Oren Beckmann, APRN 10/27/2023 19:46     I saw this patient independently.   This note was partially generated using MModal Fluency Direct system, and there may be some incorrect words, spellings, and punctuation that were not noted in checking the note before saving.

## 2023-10-27 NOTE — Patient Instructions (Signed)
Keep splint on at all times unless showering. Follow up with orthopedic surgeon for evaluation as discussed.

## 2023-10-29 ENCOUNTER — Other Ambulatory Visit: Payer: Self-pay

## 2023-10-29 ENCOUNTER — Ambulatory Visit (INDEPENDENT_AMBULATORY_CARE_PROVIDER_SITE_OTHER): Payer: Commercial Managed Care - PPO | Admitting: ORTHOPEDIC, SPORTS MEDICINE

## 2023-10-29 ENCOUNTER — Encounter (INDEPENDENT_AMBULATORY_CARE_PROVIDER_SITE_OTHER): Payer: Self-pay | Admitting: ORTHOPEDIC, SPORTS MEDICINE

## 2023-10-29 VITALS — BP 124/70 | Ht 66.0 in | Wt 210.1 lb

## 2023-10-29 DIAGNOSIS — M79642 Pain in left hand: Secondary | ICD-10-CM

## 2023-10-29 DIAGNOSIS — S62667A Nondisplaced fracture of distal phalanx of left little finger, initial encounter for closed fracture: Secondary | ICD-10-CM | POA: Insufficient documentation

## 2023-10-29 DIAGNOSIS — W010XXA Fall on same level from slipping, tripping and stumbling without subsequent striking against object, initial encounter: Secondary | ICD-10-CM

## 2023-10-29 NOTE — H&P (Signed)
[name]  [address]  [facility] [address]    History and Physical    Name: Monique [name] MRN:  [id number]   Date: 10/29/2023 DOB:  August 14, 1966 (57 y.o.)         Chief Complaint: New Patient (New patient. Left hand injury)    History of Present Illness   Monique [name] is a 57 y.o. year old female who comes to clinic for evaluation of an injury to the left hand small finger.  This occurred a couple of days ago.  She tripped and fell.  Pain is located to the D IP joint of the small finger.  There is no radiation of pain or associated numbness/tingling.  Pain is moderate in severity.  Pain is exacerbated with motion and relieved with rest.  She is right-hand dominant.    Patient Active Problem List    Diagnosis    Closed nondisplaced fracture of distal phalanx of left little finger    Tobacco use disorder    Weakness    Atherosclerosis of native artery of right lower extremity with rest pain (CMS HCC)    Chronic obstructive pulmonary disease, unspecified COPD type (CMS HCC)    Unspecified inflammatory spondylopathy, lumbar region (CMS HCC)    Other specified disorders of adrenal gland    Left leg weakness    Dizziness    Chronic lumbar radiculopathy    Hypertension associated with type 2 diabetes mellitus (CMS HCC)  (CMS HCC)    Type 2 diabetes mellitus with complication, without long-term current use of insulin (CMS HCC)    Major depressive disorder, recurrent, unspecified    Peripheral vascular disease, unspecified (CMS HCC)    Fracture of phalanx of finger of right hand    Spinal pain    Type 2 diabetes mellitus with hyperglycemia (CMS HCC)    Upper respiratory infection    Thyroid disorder    UDS: 07/28/19    Sinus congestion    Morbid obesity (CMS HCC)    Type 2 diabetes mellitus with hyperosmolarity without coma, without long-term current use of insulin (CMS HCC)    Coronary artery disease involving native coronary artery of native heart    GERD (gastroesophageal  reflux disease)    HTN (hypertension)    Hyperlipidemia    Constipation    Thyroid nodule    Chest pain    DDD (degenerative disc disease), lumbar    Anxiety    Chronic back pain    Carotid stenosis    BMI 32.0-32.9,adult    H/O complete eye exam    S/P CABG x 2    Xanthelasma    Hypercholesterolemia     Past Medical History:   Diagnosis Date    Anxiety     Arthritis     Back problem     DDD    Beta blocker prescribed for left ventricular systolic dysfunction     Blood thinned due to long-term anticoagulant use     Carotid stenosis     occlusion of left ICA    Chest pain 04/03/2018    Chronic back pain     Chronic obstructive pulmonary disease, unspecified COPD type (CMS HCC) 12/31/2022    Coronary artery disease involving native coronary artery of native heart     DDD (degenerative disc disease), lumbar     "entire spine"    Ear piercing     GERD (gastroesophageal reflux disease)     controlled with omeprazole  H/O complete eye exam     2 years, Dr. Thompson Caul, at [facility]    History of dental examination     dentures upper and lower    HTN     Hx of coronary artery bypass graft     2012    Hx of gastric bypass     Hypercholesterolemia     Hyperlipidemia     Hypothyroid     MI (myocardial infarction) (CMS HCC) 2012    Neck problem     herniated cervical disc    Obesity (BMI 30-39.9)     Peripheral vascular disease, unspecified (CMS HCC) 01/03/2021    Solitary nodule of right lobe of thyroid 03/30/2018    Incidental finding on MRI thoracic spine. [place] ordered.     Stented coronary artery     x4    Stroke (CMS [facility])     2002    Tattoo     Left breast, Right shoulder    Thyroid disorder     Thyroid nodule     Type 2 diabetes mellitus (CMS HCC)     HGA1C 10.4 on 12/19/21    Uncontrolled type 2 diabetes mellitus, without long-term current use of insulin     Xanthelasma          Past Surgical History:   Procedure Laterality Date    ENDOMETRIAL ABLATION  1998    HX ANKLE FRACTURE TX  1990s    Right, [name] procedure    HX  CATARACT REMOVAL Bilateral 2013    r and l eyes with implants    HX CESAREAN SECTION  06/20/2000    x3, 11/21/86, 11/10/84    HX CHOLECYSTECTOMY  1988    HX CORONARY ARTERY BYPASS GRAFT  03/31/2011    cardiac, 2 vessel, Dr. Francesco Runner, [facility]    HX CORONARY STENT PLACEMENT  2012    x4 stents    HX GASTRIC SLEEVE  02/2017    [place], [place], Dr. Devin Going HEART CATHETERIZATION  2012    massive heart attack [facility] stents - [facility]    HX THYROID BIOPSY      HX THYROIDECTOMY  101/01/2018    HX TONSILLECTOMY      as child    HX YAG Left 09/20/2013         Current Outpatient Medications   Medication Sig    aspirin (ECOTRIN) 81 mg Oral Tablet, Delayed Release (E.C.) Take 1 Tablet (81 mg total) by mouth Once a day    benzonatate (TESSALON) 100 mg Oral Capsule Take 1 Capsule (100 mg total) by mouth Three times a day as needed for Cough (Patient not taking: Reported on 07/11/2023)    Blood Sugar Diagnostic (ACCU-CHEK GUIDE TEST STRIPS) Does not apply Strip TEST BLOOD SUGAR THREE TIMES DAILY    Blood-Glucose Meter (ACCU-CHEK GUIDE GLUCOSE METER) Misc USE AS DIRECTED    clopidogreL (PLAVIX) 75 mg Oral Tablet TAKE 1 TABLET EVERY DAY    cyanocobalamin (VITAMIN B12) 1,000 mcg/mL Injection Solution INJECT 1 ML (1,000 MCG TOTAL) UNDER THE SKIN EVERY 30 DAYS    docusate sodium (COLACE) 100 mg Oral Capsule TAKE 1 CAPSULE TWICE DAILY    empagliflozin (JARDIANCE) 10 mg Oral Tablet Take 1 Tablet (10 mg total) by mouth Once a day    ergocalciferol, vitamin D2, (DRISDOL) 1,250 mcg (50,000 unit) Oral Capsule TAKE 1 CAPSULE BY MOUTH EVERY WEEK    ezetimibe (ZETIA) 10 mg Oral Tablet Take 1 Tablet (10  mg total) by mouth Every evening    fluticasone propionate (FLONASE) 50 mcg/actuation Nasal Spray, Suspension Administer 2 Sprays into each nostril Once a day (Patient not taking: Reported on 08/11/2023)    furosemide (LASIX) 40 mg Oral Tablet TAKE 1 TABLET EVERY DAY    HYDROcodone-acetaminophen (NORCO) 10-325 mg Oral Tablet Take 1 Tablet by mouth Every  8 hours as needed for Pain Reuben Likes with [facility]    Ibuprofen (MOTRIN) 800 mg Oral Tablet TAKE ONE TABLET BY MOUTH THREE TIMES DAILY AS NEEDED FOR PAIN WITH FOOD    Lancets (ACCU-CHEK SOFTCLIX LANCETS) Misc USE AS DIRECTED DAILY    levothyroxine (SYNTHROID) 25 mcg Oral Tablet TAKE 1 TABLET ONE TIME DAILY    metoprolol succinate (TOPROL-XL) 25 mg Oral Tablet Sustained Release 24 hr Take 0.5 Tablets (12.5 mg total) by mouth Once a day    NARCAN 4 mg/actuation Nasal Spray, Non-Aerosol PT STATED SHE HAS NARCAN AT HOME BUT IS NOT TAKING IT (Patient not taking: Reported on 08/11/2023)    Needle, Disp, 25 G 25 gauge x 1" Needle 1 mL Every 30 days    niacin (NIASPAN) 500 mg Oral Tablet Sustained Release 24 hr TAKE 1 TABLET EVERY DAY    nitroGLYCERIN (NITROSTAT) 0.4 mg Sublingual Tablet, Sublingual Place 1 Tablet (0.4 mg total) under the tongue Every 5 minutes as needed for Chest pain for 3 doses over 15 minutes    omeprazole (PRILOSEC) 40 mg Oral Capsule, Delayed Release(E.C.) TAKE 1 CAPSULE EVERY DAY    ondansetron (ZOFRAN ODT) 8 mg Oral Tablet, Rapid Dissolve DISSOLVE 1 TABLET ON THE TONGUE EVERY 8 HOURS AS NEEDED FOR NAUSEA AND VOMITING    OZEMPIC 2 mg/dose (8 mg/3 mL) Subcutaneous Pen Injector Inject 2 mg under the skin Every 7 days    pioglitazone (ACTOS) 15 mg Oral Tablet Take 1 Tablet (15 mg total) by mouth Once a day    PRENATAL VITAMIN PLUS LOW IRON 27 mg iron- 1 mg Oral Tablet TAKE 1 TABLET EVERY DAY    rosuvastatin (CRESTOR) 40 mg Oral Tablet Take 1 Tablet (40 mg total) by mouth Once a day TAKE 1 TABLET BY MOUTH ONCE DAILY    venlafaxine (EFFEXOR XR) 37.5 mg Oral Capsule, Sust. Release 24 hr TAKE 1 CAPSULE ONE TIME DAILY    ZYRTEC-D 5-120 mg Oral Tablet Sustained Release 12 hr Take 1 Tablet by mouth Once a day     Allergies   Allergen Reactions    Capron Dm [Pyrilamine-Dextromethorphan]  Other Adverse Reaction (Add comment)     LIGHT HEADED    Other      Coban dressing caused skin break  down    Tylenol W Codeine [Acetaminophen-Codeine]  Other Adverse Reaction (Add comment)     pt states that one time she had chest pains with this med, but dr. thought it was because she had not eaten     Family Medical History:       Problem Relation (Age of Onset)    Atrial fibrillation Sister    Colon Cancer Father    Diabetes Mother, Maternal Aunt    Heart Disease Sister, Maternal Grandfather    Hypertension (High Blood Pressure) Mother    Lung Cancer Maternal Grandfather    MI <58 years of age Mother            Social History     Tobacco Use    Smoking status: Every Day     Current packs/day: 0.50  Average packs/day: 0.5 packs/day for 43.9 years (21.9 ttl pk-yrs)     Types: Cigarettes     Start date: 73    Smokeless tobacco: Never   Substance Use Topics    Alcohol use: No      Review of Systems  Outside of those noted above all other systems reviewed and are negative  Examination:  BP 124/70   Ht 1.676 m (5\' 6" )   Wt 95.3 kg (210 lb 1.6 oz)   LMP  (LMP Unknown)   BMI 33.91 kg/m       Head normocephalic atraumatic.  Breathing nonlabored.  Pulse is regular rate.  Abdomen nondistended.  Affect normal.  No acute distress.    Left upper extremity-neurovascularly intact distally.  Swelling and bruising about the D IP joint of the small finger.  She is able to actively flex and extend this joint though it is painful and she has a 20 degree extensor lag.  She is tender over the dorsal D IP joint.  Data reviewed:  X-ray left hand from outside facility dated 11/19 reviewed by me.  There is a minimally displaced bony mallet fracture of the distal phalanx.  Joint alignment is congruent  Assessment and Plan  Problem List Items Addressed This Visit       Closed nondisplaced fracture of distal phalanx of left little finger - Primary     Other Visit Diagnoses       Left hand pain              Closed management without manipulation left small finger bony mallet fracture distal phalanx.  Immobilization with Stax splint  which was supplied in fitted in the office today.  Splint at all times except hygiene.  No heavy lifting.  Follow-up 6 weeks with x-ray and likely to discontinue immobilization.    Margot Chimes, MD

## 2023-10-29 NOTE — Procedures (Signed)
[facility]  [address]  [facility] [place] [address]    Procedure Note    Name: Monique [name] MRN:  [id number]   Date: 10/29/2023 DOB:  [birth year] (57 y.o.)         Ortho Injury Tx    Performed by: Margot Chimes, MD  Authorized by: Margot Chimes, MD    Consent:     Consent obtained:  Verbal    Consent given by:  Patient  Injury:     Injury location:  Finger    Finger injury location:  L little finger    Finger fracture type: distal phalanx fracture      IP joint involved: yes    Pre-procedure details:     Neurological function: normal      Distal perfusion: normal      Range of motion: reduced    Procedure details:     Manipulation performed: no      Immobilization:  Splint    Splint type:  Stack splint  Post-procedure details:     Neurological function: normal      Distal perfusion: normal      Range of motion: unchanged      Patient tolerance of procedure:  Tolerated well, no immediate complications      Margot Chimes, MD

## 2023-11-11 ENCOUNTER — Ambulatory Visit (INDEPENDENT_AMBULATORY_CARE_PROVIDER_SITE_OTHER): Payer: Commercial Managed Care - PPO | Admitting: Specialist

## 2023-11-19 ENCOUNTER — Ambulatory Visit (INDEPENDENT_AMBULATORY_CARE_PROVIDER_SITE_OTHER): Payer: Self-pay

## 2023-11-23 ENCOUNTER — Encounter: Payer: Self-pay | Admitting: Internal Medicine

## 2023-11-26 ENCOUNTER — Ambulatory Visit (INDEPENDENT_AMBULATORY_CARE_PROVIDER_SITE_OTHER): Payer: Self-pay

## 2023-12-08 ENCOUNTER — Other Ambulatory Visit (INDEPENDENT_AMBULATORY_CARE_PROVIDER_SITE_OTHER): Payer: Self-pay | Admitting: Family Medicine

## 2023-12-08 DIAGNOSIS — E538 Deficiency of other specified B group vitamins: Secondary | ICD-10-CM

## 2023-12-08 DIAGNOSIS — E559 Vitamin D deficiency, unspecified: Secondary | ICD-10-CM

## 2023-12-08 NOTE — Telephone Encounter (Signed)
Last scheduled appointment with you was 04/09/23. Currently scheduled future appointment is 12/22/2023.      Egbert Garibaldi, LPN  [id number]/[id number], 12:24

## 2023-12-11 ENCOUNTER — Encounter (INDEPENDENT_AMBULATORY_CARE_PROVIDER_SITE_OTHER): Payer: Self-pay

## 2023-12-16 ENCOUNTER — Encounter (INDEPENDENT_AMBULATORY_CARE_PROVIDER_SITE_OTHER): Payer: Self-pay

## 2023-12-16 ENCOUNTER — Ambulatory Visit (INDEPENDENT_AMBULATORY_CARE_PROVIDER_SITE_OTHER): Payer: Commercial Managed Care - PPO

## 2023-12-16 ENCOUNTER — Encounter (INDEPENDENT_AMBULATORY_CARE_PROVIDER_SITE_OTHER): Payer: Commercial Managed Care - PPO

## 2023-12-16 ENCOUNTER — Other Ambulatory Visit: Payer: Self-pay

## 2023-12-16 VITALS — BP 127/86 | HR 80 | Ht 65.98 in | Wt 210.1 lb

## 2023-12-16 DIAGNOSIS — S62667D Nondisplaced fracture of distal phalanx of left little finger, subsequent encounter for fracture with routine healing: Secondary | ICD-10-CM

## 2023-12-16 DIAGNOSIS — S62667A Nondisplaced fracture of distal phalanx of left little finger, initial encounter for closed fracture: Secondary | ICD-10-CM

## 2023-12-16 NOTE — Progress Notes (Signed)
[facility]  [address]  [facility] [address]    History and Physical    Name: Ryia Cusic MRN:  [id number]   Date: 12/16/2023 DOB:  07/30/1966 (58 y.o.)     Chief Complaint: Follow Up (Left hand fifth finger fracture pt states that she took splint off sue to being to tight an never reused it.)    History of Present Illness   Ms. Morgart is a 58 y.o. year old female who comes to clinic for evaluation of an injury to the left hand small finger.  This occurred a couple of days ago.  She tripped and fell.  Pain is located to the D IP joint of the small finger.  There is no radiation of pain or associated numbness/tingling.  Pain is moderate in severity.  Pain is exacerbated with motion and relieved with rest.  She is right-hand dominant.    12/16/23 - patient presents today for follow up evaluation of the above-mentioned injury.  She states that she has not been wearing the splint since the last office visit as it was causing her discomfort.  She is not having pain at rest to the small finger but does notice discomfort when she attempts to flex/extend.  She is right-hand dominant.  Denies numbness/tingling through upper extremity.  She has been working on gentle motion on her own but is still unable to actively extend at the IP joint.  This does not bother her much or cause significant functional limitation.  No additional concerns today.    Patient Active Problem List    Diagnosis    Closed nondisplaced fracture of distal phalanx of left little finger    Tobacco use disorder    Weakness    Atherosclerosis of native artery of right lower extremity with rest pain (CMS HCC)    Chronic obstructive pulmonary disease, unspecified COPD type (CMS HCC)    Unspecified inflammatory spondylopathy, lumbar region (CMS HCC)    Other specified disorders of adrenal gland    Left leg weakness    Dizziness    Chronic lumbar radiculopathy    Hypertension associated with type 2 diabetes  mellitus (CMS HCC)  (CMS HCC)    Type 2 diabetes mellitus with complication, without long-term current use of insulin (CMS HCC)    Major depressive disorder, recurrent, unspecified    Peripheral vascular disease, unspecified (CMS HCC)    Fracture of phalanx of finger of right hand    Spinal pain    Type 2 diabetes mellitus with hyperglycemia (CMS HCC)    Upper respiratory infection    Thyroid disorder    UDS: 07/28/19    Sinus congestion    Morbid obesity (CMS HCC)    Type 2 diabetes mellitus with hyperosmolarity without coma, without long-term current use of insulin (CMS HCC)    Coronary artery disease involving native coronary artery of native heart    GERD (gastroesophageal reflux disease)    HTN (hypertension)    Hyperlipidemia    Constipation    Thyroid nodule    Chest pain    DDD (degenerative disc disease), lumbar    Anxiety    Chronic back pain    Carotid stenosis    BMI 32.0-32.9,adult    H/O complete eye exam    S/P CABG x 2    Xanthelasma    Hypercholesterolemia     Past Medical History:   Diagnosis Date    Anxiety     Arthritis  Back problem     DDD    Beta blocker prescribed for left ventricular systolic dysfunction     Blood thinned due to long-term anticoagulant use     Carotid stenosis     occlusion of left ICA    Chest pain 04/03/2018    Chronic back pain     Chronic obstructive pulmonary disease, unspecified COPD type (CMS HCC) 12/31/2022    Coronary artery disease involving native coronary artery of native heart     DDD (degenerative disc disease), lumbar     "entire spine"    Ear piercing     GERD (gastroesophageal reflux disease)     controlled with omeprazole    H/O complete eye exam     2 years, Dr. Thompson Caul, at [facility]    History of dental examination     dentures upper and lower    HTN     Hx of coronary artery bypass graft     2012    Hx of gastric bypass     Hypercholesterolemia     Hyperlipidemia     Hypothyroid     MI (myocardial infarction) (CMS HCC) 2012    Neck problem     herniated  cervical disc    Obesity (BMI 30-39.9)     Peripheral vascular disease, unspecified (CMS HCC) 01/03/2021    Solitary nodule of right lobe of thyroid 03/30/2018    Incidental finding on MRI thoracic spine. [place] ordered.     Stented coronary artery     x4    Stroke (CMS [facility])     2002    Tattoo     Left breast, Right shoulder    Thyroid disorder     Thyroid nodule     Type 2 diabetes mellitus (CMS HCC)     HGA1C 10.4 on 12/19/21    Uncontrolled type 2 diabetes mellitus, without long-term current use of insulin     Xanthelasma          Past Surgical History:   Procedure Laterality Date    ENDOMETRIAL ABLATION  1998    HX ANKLE FRACTURE TX  1990s    Right, [name] procedure    HX CATARACT REMOVAL Bilateral 2013    r and l eyes with implants    HX CESAREAN SECTION  06/20/2000    x3, 11/21/86, 11/10/84    HX CHOLECYSTECTOMY  1988    HX CORONARY ARTERY BYPASS GRAFT  03/31/2011    cardiac, 2 vessel, Dr. Francesco Runner, [facility]    HX CORONARY STENT PLACEMENT  2012    x4 stents    HX GASTRIC SLEEVE  02/2017    [place], [place], Dr. Devin Going HEART CATHETERIZATION  2012    massive heart attack and stents - [facility]    HX THYROID BIOPSY      HX THYROIDECTOMY  101/01/2018    HX TONSILLECTOMY      as child    HX YAG Left 09/20/2013         Current Outpatient Medications   Medication Sig    aspirin (ECOTRIN) 81 mg Oral Tablet, Delayed Release (E.C.) Take 1 Tablet (81 mg total) by mouth Once a day    BD PRECISIONGLIDE 25 gauge x 1" Needle USE TO INJECT EVERY 30 DAYS    benzonatate (TESSALON) 100 mg Oral Capsule Take 1 Capsule (100 mg total) by mouth Three times a day as needed for Cough    Blood Sugar Diagnostic (ACCU-CHEK  GUIDE TEST STRIPS) Does not apply Strip TEST BLOOD SUGAR THREE TIMES DAILY    Blood-Glucose Meter (ACCU-CHEK GUIDE GLUCOSE METER) Misc USE AS DIRECTED    clopidogreL (PLAVIX) 75 mg Oral Tablet TAKE 1 TABLET EVERY DAY    cyanocobalamin (VITAMIN B12) 1,000 mcg/mL Injection Solution INJECT 1 ML (1,000 MCG TOTAL) UNDER  THE SKIN EVERY 30 DAYS    docusate sodium (COLACE) 100 mg Oral Capsule TAKE 1 CAPSULE TWICE DAILY    empagliflozin (JARDIANCE) 10 mg Oral Tablet Take 1 Tablet (10 mg total) by mouth Once a day    ergocalciferol, vitamin D2, (DRISDOL) 1,250 mcg (50,000 unit) Oral Capsule TAKE 1 CAPSULE BY MOUTH EVERY WEEK    ezetimibe (ZETIA) 10 mg Oral Tablet Take 1 Tablet (10 mg total) by mouth Every evening    fluticasone propionate (FLONASE) 50 mcg/actuation Nasal Spray, Suspension Administer 2 Sprays into each nostril Once a day    furosemide (LASIX) 40 mg Oral Tablet TAKE 1 TABLET EVERY DAY    HYDROcodone-acetaminophen (NORCO) 10-325 mg Oral Tablet Take 1 Tablet by mouth Every 8 hours as needed for Pain Reuben Likes with [facility]    Ibuprofen (MOTRIN) 800 mg Oral Tablet TAKE ONE TABLET BY MOUTH THREE TIMES DAILY AS NEEDED FOR PAIN WITH FOOD    Lancets (ACCU-CHEK SOFTCLIX LANCETS) Misc USE AS DIRECTED DAILY    levothyroxine (SYNTHROID) 25 mcg Oral Tablet TAKE 1 TABLET ONE TIME DAILY    metoprolol succinate (TOPROL-XL) 25 mg Oral Tablet Sustained Release 24 hr Take 0.5 Tablets (12.5 mg total) by mouth Once a day    NARCAN 4 mg/actuation Nasal Spray, Non-Aerosol PT STATED SHE HAS NARCAN AT HOME BUT IS NOT TAKING IT    niacin (NIASPAN) 500 mg Oral Tablet Sustained Release 24 hr TAKE 1 TABLET EVERY DAY    nitroGLYCERIN (NITROSTAT) 0.4 mg Sublingual Tablet, Sublingual Place 1 Tablet (0.4 mg total) under the tongue Every 5 minutes as needed for Chest pain for 3 doses over 15 minutes    omeprazole (PRILOSEC) 40 mg Oral Capsule, Delayed Release(E.C.) TAKE 1 CAPSULE EVERY DAY    ondansetron (ZOFRAN ODT) 8 mg Oral Tablet, Rapid Dissolve DISSOLVE 1 TABLET ON THE TONGUE EVERY 8 HOURS AS NEEDED FOR NAUSEA AND VOMITING    OZEMPIC 2 mg/dose (8 mg/3 mL) Subcutaneous Pen Injector Inject 2 mg under the skin Every 7 days    pioglitazone (ACTOS) 15 mg Oral Tablet Take 1 Tablet (15 mg total) by mouth Once a day    PRENATAL  VITAMIN PLUS LOW IRON 27 mg iron- 1 mg Oral Tablet TAKE 1 TABLET EVERY DAY    rosuvastatin (CRESTOR) 40 mg Oral Tablet Take 1 Tablet (40 mg total) by mouth Once a day TAKE 1 TABLET BY MOUTH ONCE DAILY    venlafaxine (EFFEXOR XR) 37.5 mg Oral Capsule, Sust. Release 24 hr TAKE 1 CAPSULE ONE TIME DAILY    ZYRTEC-D 5-120 mg Oral Tablet Sustained Release 12 hr Take 1 Tablet by mouth Once a day     Allergies   Allergen Reactions    Capron Dm [Pyrilamine-Dextromethorphan]  Other Adverse Reaction (Add comment)     LIGHT HEADED    Other      Coban dressing caused skin break down    Tylenol W Codeine [Acetaminophen-Codeine]  Other Adverse Reaction (Add comment)     pt states that one time she had chest pains with this med, but dr. thought it was because she had not eaten  Family Medical History:       Problem Relation (Age of Onset)    Atrial fibrillation Sister    Colon Cancer Father    Diabetes Mother, Maternal Aunt    Heart Disease Sister, Maternal Grandfather    Hypertension (High Blood Pressure) Mother    Lung Cancer Maternal Grandfather    MI <66 years of age Mother            Social History     Tobacco Use    Smoking status: Every Day     Current packs/day: 0.50     Average packs/day: 0.5 packs/day for 44.0 years (22.0 ttl pk-yrs)     Types: Cigarettes     Start date: 13    Smokeless tobacco: Never   Substance Use Topics    Alcohol use: No      Review of Systems  Outside of those noted above all other systems reviewed and are negative  Examination:  BP 127/86   Pulse 80   Ht 1.676 m (5' 5.98")   Wt 95.3 kg (210 lb 1.6 oz)   LMP  (LMP Unknown)   SpO2 95%   BMI 33.93 kg/m       Head normocephalic atraumatic.  Breathing nonlabored.  Pulse is regular rate.  Abdomen nondistended.  Affect normal.  No acute distress.    Left upper extremity-neurovascularly intact distally.  No significant swelling or bruising to small finger. 20 degree extensor lag at DIP joint remains.  She also remains tender over the dorsal D  IP joint.  Patient unable to make a composite fist this time.    Data reviewed:  X-ray left hand obtained and reviewed in office today showing a bony mallet fracture of the small finger distal phalanx that has displaced dorsally since previous images taken on 11/19.      Assessment and Plan  Problem List Items Addressed This Visit          Musculoskeletal    Closed nondisplaced fracture of distal phalanx of left little finger - Primary    Relevant Orders    XR FINGER, 5TH/SMALL LEFT (Completed)     Left small finger bony mallet fracture with DOI 10/27/23:  X-rays obtained today and reviewed with the patient.  Discussed appropriate plan of care moving forward with Dr. Alycia Rossetti as he was present in clinic.  I related to patient that given length of time since injury and displacement of fracture, she does have a surgical option of discussing repair with hand specialist.  Patient is not interested at this time.  She has not been wearing splint.  I encouraged hand therapy but she is not interested at this time.  She will follow up in 6 weeks for x-rays left small finger and to ensure improvement has been made in range of motion and symptoms.  Patient voiced understanding and is agreeable to this plan.  She was encouraged to contact us if any questions or concerns arise    Baruch Goldmann, PA-C

## 2023-12-21 ENCOUNTER — Ambulatory Visit: Payer: Commercial Managed Care - PPO

## 2023-12-21 ENCOUNTER — Other Ambulatory Visit: Payer: Self-pay

## 2023-12-21 ENCOUNTER — Encounter (INDEPENDENT_AMBULATORY_CARE_PROVIDER_SITE_OTHER): Payer: Self-pay

## 2023-12-21 VITALS — BP 126/80 | Ht 65.98 in | Wt 210.1 lb

## 2023-12-21 DIAGNOSIS — Z7984 Long term (current) use of oral hypoglycemic drugs: Secondary | ICD-10-CM

## 2023-12-21 DIAGNOSIS — M79675 Pain in left toe(s): Secondary | ICD-10-CM | POA: Insufficient documentation

## 2023-12-21 DIAGNOSIS — E1100 Type 2 diabetes mellitus with hyperosmolarity without nonketotic hyperglycemic-hyperosmolar coma (NKHHC): Secondary | ICD-10-CM | POA: Insufficient documentation

## 2023-12-21 DIAGNOSIS — M79674 Pain in right toe(s): Secondary | ICD-10-CM | POA: Insufficient documentation

## 2023-12-21 DIAGNOSIS — B351 Tinea unguium: Secondary | ICD-10-CM | POA: Insufficient documentation

## 2023-12-21 DIAGNOSIS — E11628 Type 2 diabetes mellitus with other skin complications: Secondary | ICD-10-CM

## 2023-12-22 ENCOUNTER — Encounter (INDEPENDENT_AMBULATORY_CARE_PROVIDER_SITE_OTHER): Payer: Commercial Managed Care - PPO | Admitting: Family Medicine

## 2023-12-22 ENCOUNTER — Other Ambulatory Visit (INDEPENDENT_AMBULATORY_CARE_PROVIDER_SITE_OTHER): Payer: Self-pay | Admitting: Family Medicine

## 2023-12-22 ENCOUNTER — Encounter (INDEPENDENT_AMBULATORY_CARE_PROVIDER_SITE_OTHER): Payer: Self-pay

## 2023-12-22 DIAGNOSIS — E119 Type 2 diabetes mellitus without complications: Secondary | ICD-10-CM

## 2023-12-22 MED ORDER — ACCU-CHEK GUIDE TEST STRIPS
ORAL_STRIP | 3 refills | Status: DC
Start: 2023-12-22 — End: 2024-09-21

## 2023-12-22 NOTE — Procedures (Signed)
[name]  [address]  [place] [address]  Operated by [facility]  Procedure Note    Name: Monique [name] MRN:  [id number]   Date: 12/21/2023 DOB:  Jul 17, 1966 (57 y.o.)         11721 - DEBRIDEMENT OF NAIL BY ANY METHOD; 6 OR MORE (AMB ONLY)    Performed by: Lillia Mountain, DPM  Authorized by: Lillia Mountain, DPM    Time Out:     Immediately before the procedure, a time out was called:  Yes    Patient verified:  Yes    Procedure Verified:  Yes    Site Verified:  Yes  Documentation:      Aseptic Debridement of nails 1-5 bilateral of dystrophic discolored and elongated painful nails bilateral.  Use of sterile grinder to remove thickness of painful thickened nails.  Patient tolerated procedure well.       Lillia Mountain, [place]

## 2023-12-22 NOTE — Progress Notes (Signed)
[name]  [address]  [place] [address]  Operated by [facility]    Diabetic Foot Exam       Name: Monique [name] MRN:  [id number]   Date: 12/21/2023 Age: 58 y.o.     Chief Complaint:    Routine Foot Care (Diabetic routine foot care. Last trim 08/11/2023. A1C 8.9% 08/12/2023. Pain /10 FWB slip on shoes. Would like to talk about the 2nd toenail on the right foot. )      HPI: Monique [name] is a 58 y.o. female who presents for diabetic examination. Presently needing a diabetic education and evaluation, c/o painful elongated painful toenails and calluses. Advised by Monique Rack, MD  to seek regular visits to podiatry as a preventative measure. Denies any other complaints.      History:  Past Medical History:   Diagnosis Date    Anxiety     Arthritis     Back problem     DDD    Beta blocker prescribed for left ventricular systolic dysfunction     Blood thinned due to long-term anticoagulant use     Carotid stenosis     occlusion of left ICA    Chest pain 04/03/2018    Chronic back pain     Chronic obstructive pulmonary disease, unspecified COPD type (CMS HCC) 12/31/2022    Coronary artery disease involving native coronary artery of native heart     DDD (degenerative disc disease), lumbar     "entire spine"    Ear piercing     GERD (gastroesophageal reflux disease)     controlled with omeprazole    H/O complete eye exam     2 years, Dr. Thompson [name], at [facility]    History of dental examination     dentures upper and lower    HTN     Hx of coronary artery bypass graft     2012    Hx of gastric bypass     Hypercholesterolemia     Hyperlipidemia     Hypothyroid     MI (myocardial infarction) (CMS HCC) 2012    Neck problem     herniated cervical disc    Obesity (BMI 30-39.9)     Peripheral vascular disease, unspecified (CMS HCC) 01/03/2021    Solitary nodule of right lobe of thyroid 03/30/2018    Incidental finding on MRI thoracic spine. [place] ordered.     Stented coronary  artery     x4    Stroke (CMS [facility])     2002    Tattoo     Left breast, Right shoulder    Thyroid disorder     Thyroid nodule     Type 2 diabetes mellitus (CMS HCC)     HGA1C 10.4 on 12/19/21    Uncontrolled type 2 diabetes mellitus, without long-term current use of insulin     Xanthelasma          Family Medical History:       Problem Relation (Age of Onset)    Atrial fibrillation Sister    Colon Cancer Father    Diabetes Mother, Maternal Aunt    Heart Disease Sister, Maternal Grandfather    Hypertension (High Blood Pressure) Mother    Lung Cancer Maternal Grandfather    MI <57 years of age Mother            Social History     Socioeconomic History    Marital status: Married  Spouse name: Monique [name]    Number of children: 3    Years of education: completed 11th grade   Occupational History    Occupation: disabled   Tobacco Use    Smoking status: Every Day     Current packs/day: 0.50     Average packs/day: 0.5 packs/day for 44.0 years (22.0 ttl pk-yrs)     Types: Cigarettes     Start date: 76    Smokeless tobacco: Never   Vaping Use    Vaping status: Never Used   Substance and Sexual Activity    Alcohol use: No    Drug use: No    Sexual activity: Yes     Partners: Male   Other Topics Concern    Ability to Walk 1 Flight of Steps without SOB/CP Yes    Routine Exercise No    Ability to Walk 2 Flight of Steps without SOB/CP Yes    Ability To Do Own ADL's Yes    Uses Walker No    Other Activity Level Yes     Comment: on the go all the time - does cleaning, grocery shopping    Uses Cane No   Social History Narrative    Living situation: lives at home with husband, Monique [name], and daughter.  1 dog named Monique [name] and 1 cat named Monique [name].    Nutrition:  Eats 5 small meals d/t gastric sleeve surgery.     Caffeine use: none, decaf coffee    Exercise: stays busy around house. No formal form of exercise    Seatbelt use: sometimes    Fire extinguishers in the home: yes    Smoke alarms in the home: yes    Carbon monoxide detectors in the home:  yes    Wynelle Link exposure: sunglasses        Monique [name] would like for husband, Monique [name],  to speak for her in the event she would become incapacitated.    Full code.      Social Determinants of Health     Social Connections: Medium Risk (02/17/2023)    Social Connections     SDOH Social Isolation: 3 to 5 times a week      Past Surgical History:   Procedure Laterality Date    ENDOMETRIAL ABLATION  1998    HX ANKLE FRACTURE TX  1990s    Right, Monique [name] procedure    HX CATARACT REMOVAL Bilateral 2013    r and l eyes with implants    HX CESAREAN SECTION  06/20/2000    x3, 11/21/86, 11/10/84    HX CHOLECYSTECTOMY  1988    HX CORONARY ARTERY BYPASS GRAFT  03/31/2011    cardiac, 2 vessel, Dr. Francesco [name], [facility]    HX CORONARY STENT PLACEMENT  2012    x4 stents    HX GASTRIC SLEEVE  02/2017    [place], [place], Dr. Devin [name] HEART CATHETERIZATION  2012    massive heart attack and stents - [facility]    HX THYROID BIOPSY      HX THYROIDECTOMY  101/01/2018    HX TONSILLECTOMY      as child    HX YAG Left 09/20/2013         Medications:  Current Outpatient Medications   Medication Sig    aspirin (ECOTRIN) 81 mg Oral Tablet, Delayed Release (E.C.) Take 1 Tablet (81 mg total) by mouth Once a day    BD PRECISIONGLIDE 25 gauge x 1" Needle USE TO INJECT  EVERY 30 DAYS    benzonatate (TESSALON) 100 mg Oral Capsule Take 1 Capsule (100 mg total) by mouth Three times a day as needed for Cough    Blood Sugar Diagnostic (ACCU-CHEK GUIDE TEST STRIPS) Does not apply Strip TEST BLOOD SUGAR THREE TIMES DAILY    Blood-Glucose Meter (ACCU-CHEK GUIDE GLUCOSE METER) Misc USE AS DIRECTED    clopidogreL (PLAVIX) 75 mg Oral Tablet TAKE 1 TABLET EVERY DAY    cyanocobalamin (VITAMIN B12) 1,000 mcg/mL Injection Solution INJECT 1 ML (1,000 MCG TOTAL) UNDER THE SKIN EVERY 30 DAYS    docusate sodium (COLACE) 100 mg Oral Capsule TAKE 1 CAPSULE TWICE DAILY    empagliflozin (JARDIANCE) 10 mg Oral Tablet Take 1 Tablet (10 mg total) by mouth Once a day     ergocalciferol, vitamin D2, (DRISDOL) 1,250 mcg (50,000 unit) Oral Capsule TAKE 1 CAPSULE BY MOUTH EVERY WEEK    ezetimibe (ZETIA) 10 mg Oral Tablet Take 1 Tablet (10 mg total) by mouth Every evening    fluticasone propionate (FLONASE) 50 mcg/actuation Nasal Spray, Suspension Administer 2 Sprays into each nostril Once a day    furosemide (LASIX) 40 mg Oral Tablet TAKE 1 TABLET EVERY DAY    HYDROcodone-acetaminophen (NORCO) 10-325 mg Oral Tablet Take 1 Tablet by mouth Every 8 hours as needed for Pain Reuben Likes with [facility]    Ibuprofen (MOTRIN) 800 mg Oral Tablet TAKE ONE TABLET BY MOUTH THREE TIMES DAILY AS NEEDED FOR PAIN WITH FOOD    Lancets (ACCU-CHEK SOFTCLIX LANCETS) Misc USE AS DIRECTED DAILY    levothyroxine (SYNTHROID) 25 mcg Oral Tablet TAKE 1 TABLET ONE TIME DAILY    metoprolol succinate (TOPROL-XL) 25 mg Oral Tablet Sustained Release 24 hr Take 0.5 Tablets (12.5 mg total) by mouth Once a day    NARCAN 4 mg/actuation Nasal Spray, Non-Aerosol PT STATED SHE HAS NARCAN AT HOME BUT IS NOT TAKING IT    niacin (NIASPAN) 500 mg Oral Tablet Sustained Release 24 hr TAKE 1 TABLET EVERY DAY    nitroGLYCERIN (NITROSTAT) 0.4 mg Sublingual Tablet, Sublingual Place 1 Tablet (0.4 mg total) under the tongue Every 5 minutes as needed for Chest pain for 3 doses over 15 minutes    omeprazole (PRILOSEC) 40 mg Oral Capsule, Delayed Release(E.C.) TAKE 1 CAPSULE EVERY DAY    ondansetron (ZOFRAN ODT) 8 mg Oral Tablet, Rapid Dissolve DISSOLVE 1 TABLET ON THE TONGUE EVERY 8 HOURS AS NEEDED FOR NAUSEA AND VOMITING    OZEMPIC 2 mg/dose (8 mg/3 mL) Subcutaneous Pen Injector Inject 2 mg under the skin Every 7 days    pioglitazone (ACTOS) 15 mg Oral Tablet Take 1 Tablet (15 mg total) by mouth Once a day    PRENATAL VITAMIN PLUS LOW IRON 27 mg iron- 1 mg Oral Tablet TAKE 1 TABLET EVERY DAY    rosuvastatin (CRESTOR) 40 mg Oral Tablet Take 1 Tablet (40 mg total) by mouth Once a day TAKE 1 TABLET BY MOUTH ONCE DAILY     venlafaxine (EFFEXOR XR) 37.5 mg Oral Capsule, Sust. Release 24 hr TAKE 1 CAPSULE ONE TIME DAILY    ZYRTEC-D 5-120 mg Oral Tablet Sustained Release 12 hr Take 1 Tablet by mouth Once a day     Allergies:  Allergies   Allergen Reactions    Capron Dm [Pyrilamine-Dextromethorphan]  Other Adverse Reaction (Add comment)     LIGHT HEADED    Other      Coban dressing caused skin break down  Tylenol W Codeine [Acetaminophen-Codeine]  Other Adverse Reaction (Add comment)     pt states that one time she had chest pains with this med, but dr. thought it was because she had not eaten       ROS:  Review of systems negative except noted in HPI     PE:  Vitals:    12/21/23 1530   BP: 126/80   Weight: 95.3 kg (210 lb 1.6 oz)   Height: 1.676 m (5' 5.98")   BMI: 33.93       Vascular:   Dorsalis pedis and posterior tibial pulses palpable b/l.  Capillary Fill time < 3 seconds to digits 1-5 b/l.  Skin temperature warm to warm tibial tuberosity to the digits b/l.  Hair growth present.  Non pitting edema bilaterally  Varicosities medial ankle bilaterally      Neurological:   Intact vibratory sensation at the hallux IPJ b/l.  Intact protective sensation b/l via SWMF.     Dermatological:   Skin appears well hydrated and supple.   Good color, texture, turgor.   No open lesions present.   No callosities present.   Webspaces clean and dry 1-4 b/l.   Skin:   No skin rash, subcutaneous nodules, lesions or ulcers observed b/l LE  Skin is warm and dry with normal turgor   No interdigital maceration b/l     Nails 1-5 bilaterally are elongated, discolored, and dystrophic.     Musculoskeletal/Orthopaedic:   Structurally within normal limits.  +5/5 muscle strength Dorsiflexion, Plantarflexion, Inversion, Eversion b/l.  ROM of the 1st MTP joint is full b/l.    ROM of the MTJ/STJ is full without pain or crepitus b/l.    Ankle joint ROM is normal b/l.       Imaging Studies and Procedures:      11721 - DEBRIDEMENT OF NAIL BY ANY METHOD; 6 OR MORE (AMB  ONLY)     Performed by: Lillia Mountain, DPM  Authorized by: Lillia Mountain, DPM    Time Out:     Immediately before the procedure, a time out was called:  Yes    Patient verified:  Yes    Procedure Verified:  Yes    Site Verified:  Yes  Documentation:      Aseptic Debridement of nails 1-5 bilateral of dystrophic discolored and elongated painful nails bilateral.  Use of sterile grinder to remove thickness of painful thickened nails.  Patient tolerated procedure well.     Assessment and Plan:    ICD-10-CM    1. Type 2 diabetes mellitus with hyperosmolarity without coma, without long-term current use of insulin (CMS HCC)  E11.00 11721 - DEBRIDEMENT OF NAIL BY ANY METHOD; 6 OR MORE (AMB ONLY)      2. Onychomycosis  B35.1 11721 - DEBRIDEMENT OF NAIL BY ANY METHOD; 6 OR MORE (AMB ONLY)      3. Pain in toes of both feet  M79.674     M79.675            Patient is a 58 y.o. female type 2 Diabetic with painful elongated toenails 1 through 5 bilateral, as well seeking podiatric care as advised by primary care.  Performed a complete Diabetic examination of both feet and ankles. Diabetes education was provided to the patient emphasizing the need for proper shoe gear and daily hygiene, daily inspection, early intervention for foot problems, avoidance of self-care, and the need to maintain the recommended timeframe between at-risk foot care appointments to reduce  the likelihood of developing potentially serious foot problems. The patient was advised to RTC immediately if any acute foot problems arise no matter how insignificant they may seem to the patient. Debridement of dystrophic and mycotic toenails in length and thickness 1-5 bilateral by way of an electric grinder to as close to normal thickness as the patient would tolerate with good relief obtained as evidenced by pain-free ambulation.  The treatment of the toenails is necessary due to patient's Diabetes.    Informed Patient on the need to control blood sugar and how  diabetes affects the feet  Instructed Patient to check feet daily, to keep feet moisturized, and to always check shoes before putting on.  Discussed elevated sugars in relation to affecting the nerves in the feet  Informed Pt that an A1c of 6.5 is acceptable, but a fasting glucose reading of 140 is uncontrolled  Discussed Diabetic neuropathy and how it affects the feet  Discussed and explained to Pt that if they lose the protective sensation, they won't be able to detect the sensation of stepping on something  Informed Pt that diabetes as a disease in general is a disease of progression but is a condition that is treatable    Recommended a daily lotion, such as gold bond diabetic cream     Aseptic Debridement of nails 1-5 bilateral of dystrophic discolored and elongated painful nails bilateral.  Use of sterile grinder to remove thickness of painful thickened nails.  Patient tolerated procedure well.    Follow Up:  Return in about 10 weeks (around 02/29/2024) for routine diabetic foot care.    I am scribing for, and in the presence of, Dr. Faythe Casa for services provided on 12/21/2023.  Ludger Nutting, SCRIBE     [place], [place]    I personally performed the services described in this documentation, as scribed  in my presence, and it is both accurate  and complete.    [place], DPM     [place], [place]

## 2023-12-22 NOTE — Telephone Encounter (Signed)
Last visit with PCP was Visit date not found.    Last office visit in the department was 04/09/2023 .  Currently scheduled future appointment in the department is 01/05/2024.    Nethaniel Mattie Hyacinth Meeker, LPN, 04/01/9562 , 12:00     [place]

## 2023-12-25 ENCOUNTER — Ambulatory Visit (INDEPENDENT_AMBULATORY_CARE_PROVIDER_SITE_OTHER)
Admission: RE | Admit: 2023-12-25 | Discharge: 2023-12-25 | Disposition: A | Discharge status: Home / Self Care | Payer: Commercial Managed Care - PPO | Source: Ambulatory Visit

## 2023-12-25 ENCOUNTER — Other Ambulatory Visit: Payer: Self-pay

## 2023-12-25 ENCOUNTER — Encounter (INDEPENDENT_AMBULATORY_CARE_PROVIDER_SITE_OTHER): Payer: Self-pay | Admitting: FAMILY MEDICINE

## 2023-12-25 ENCOUNTER — Ambulatory Visit: Payer: Commercial Managed Care - PPO | Attending: FAMILY MEDICINE | Admitting: FAMILY MEDICINE

## 2023-12-25 VITALS — BP 120/80 | HR 88 | Temp 98.1°F | Resp 18 | Ht 68.0 in | Wt 212.8 lb

## 2023-12-25 DIAGNOSIS — J329 Chronic sinusitis, unspecified: Secondary | ICD-10-CM | POA: Insufficient documentation

## 2023-12-25 DIAGNOSIS — R52 Pain, unspecified: Secondary | ICD-10-CM | POA: Insufficient documentation

## 2023-12-25 LAB — POCT RAPID SARS-COV-2 ANTIGEN (AMB ONLY): COVID-19 AG: NEGATIVE

## 2023-12-25 LAB — POCT INFLUENZA A & B: Flu A & B by PCR: NEGATIVE

## 2023-12-25 MED ORDER — DOXYCYCLINE HYCLATE 100 MG CAPSULE
100.0000 mg | ORAL_CAPSULE | Freq: Two times a day (BID) | ORAL | 0 refills | Status: DC
Start: 2023-12-25 — End: 2024-01-05

## 2023-12-25 NOTE — Progress Notes (Signed)
[facility], [facility]  [address]  [place] [address]  Operated by [facility]  Progress Note    Name: Monique [name] MRN:  [id number]   Date: 12/25/2023 DOB:  May 12, 1966 (58 y.o.)              ASSESSMENT AND PLAN:    1. Sinusitis, unspecified chronicity, unspecified location  - proximally 3 days of cough ear pain headache sore throat and congestion without known sick contact.  Possible contagious from husband but he says he mostly has allergies much less severe and pronounced symptoms.  Patient states it is the crud  - POCT Rapid Covid-19 Antigen (AMB ONLY) - neg  - POCT INFLUENZA A & B - Neg  - XR CHEST PA AND LATERAL; no acute disease  - exam shows very erythematous nasal pain mildly erythematous right tympanic membrane otherwise fairly benign and nonspecific findings apart from distant lung sounds slightly asymmetric  - doxycycline hyclate (VIBRAMYCIN) 100 mg Oral Capsule; Take 1 Capsule (100 mg total) by mouth Twice daily for 7 days Indications: inflammation of the tissue lining the sinuses  Dispense: 14 Capsule; Refill: 0      FOLLOW UP:  No follow-ups on file.    CHIEF COMPLAINT:   Chief Complaint   Patient presents with    Cough    Ear Pain    Headache    Sore Throat    Sinus Problem     Pt states that she has been coughing, ear pain, headache, sore throat and a lot of congestion for the past 3 days         HPI:  Monique [name] is a 58 y.o. female who presents for proximally 3 days of cough ear pain headache sore throat and congestion without known sick contact.  Possible contagious from husband but he says he mostly has allergies much less severe and pronounced symptoms.  Patient states it is the crud    MEDS:  aspirin (ECOTRIN) 81 mg Oral Tablet, Delayed Release (E.C.), Take 1 Tablet (81 mg total) by mouth Once a day  BD PRECISIONGLIDE 25 gauge x 1" Needle, USE TO INJECT EVERY 30 DAYS  benzonatate (TESSALON) 100 mg Oral Capsule, Take 1 Capsule (100 mg total) by mouth  Three times a day as needed for Cough (Patient not taking: Reported on 12/25/2023)  Blood Sugar Diagnostic (ACCU-CHEK GUIDE TEST STRIPS) Does not apply Strip, TEST BLOOD SUGAR THREE TIMES DAILY  Blood-Glucose Meter (ACCU-CHEK GUIDE GLUCOSE METER) Misc, USE AS DIRECTED  clopidogreL (PLAVIX) 75 mg Oral Tablet, TAKE 1 TABLET EVERY DAY  cyanocobalamin (VITAMIN B12) 1,000 mcg/mL Injection Solution, INJECT 1 ML (1,000 MCG TOTAL) UNDER THE SKIN EVERY 30 DAYS  docusate sodium (COLACE) 100 mg Oral Capsule, TAKE 1 CAPSULE TWICE DAILY  empagliflozin (JARDIANCE) 10 mg Oral Tablet, Take 1 Tablet (10 mg total) by mouth Once a day  ergocalciferol, vitamin D2, (DRISDOL) 1,250 mcg (50,000 unit) Oral Capsule, TAKE 1 CAPSULE BY MOUTH EVERY WEEK  ezetimibe (ZETIA) 10 mg Oral Tablet, Take 1 Tablet (10 mg total) by mouth Every evening  fluticasone propionate (FLONASE) 50 mcg/actuation Nasal Spray, Suspension, Administer 2 Sprays into each nostril Once a day  furosemide (LASIX) 40 mg Oral Tablet, TAKE 1 TABLET EVERY DAY  HYDROcodone-acetaminophen (NORCO) 10-325 mg Oral Tablet, Take 1 Tablet by mouth Every 8 hours as needed for Pain Reuben Likes with [facility]  Ibuprofen (MOTRIN) 800 mg Oral Tablet, TAKE ONE TABLET BY MOUTH  THREE TIMES DAILY AS NEEDED FOR PAIN WITH FOOD  Lancets (ACCU-CHEK SOFTCLIX LANCETS) Misc, USE AS DIRECTED DAILY  levothyroxine (SYNTHROID) 25 mcg Oral Tablet, TAKE 1 TABLET ONE TIME DAILY  metoprolol succinate (TOPROL-XL) 25 mg Oral Tablet Sustained Release 24 hr, Take 0.5 Tablets (12.5 mg total) by mouth Once a day  NARCAN 4 mg/actuation Nasal Spray, Non-Aerosol, PT STATED SHE HAS NARCAN AT HOME BUT IS NOT TAKING IT  niacin (NIASPAN) 500 mg Oral Tablet Sustained Release 24 hr, TAKE 1 TABLET EVERY DAY  nitroGLYCERIN (NITROSTAT) 0.4 mg Sublingual Tablet, Sublingual, Place 1 Tablet (0.4 mg total) under the tongue Every 5 minutes as needed for Chest pain for 3 doses over 15 minutes  omeprazole  (PRILOSEC) 40 mg Oral Capsule, Delayed Release(E.C.), TAKE 1 CAPSULE EVERY DAY  ondansetron (ZOFRAN ODT) 8 mg Oral Tablet, Rapid Dissolve, DISSOLVE 1 TABLET ON THE TONGUE EVERY 8 HOURS AS NEEDED FOR NAUSEA AND VOMITING  OZEMPIC 2 mg/dose (8 mg/3 mL) Subcutaneous Pen Injector, Inject 2 mg under the skin Every 7 days  pioglitazone (ACTOS) 15 mg Oral Tablet, Take 1 Tablet (15 mg total) by mouth Once a day  PRENATAL VITAMIN PLUS LOW IRON 27 mg iron- 1 mg Oral Tablet, TAKE 1 TABLET EVERY DAY  rosuvastatin (CRESTOR) 40 mg Oral Tablet, Take 1 Tablet (40 mg total) by mouth Once a day TAKE 1 TABLET BY MOUTH ONCE DAILY  venlafaxine (EFFEXOR XR) 37.5 mg Oral Capsule, Sust. Release 24 hr, TAKE 1 CAPSULE ONE TIME DAILY  ZYRTEC-D 5-120 mg Oral Tablet Sustained Release 12 hr, Take 1 Tablet by mouth Once a day    No facility-administered medications prior to visit.    ALLERGIES:  Allergies   Allergen Reactions    Capron Dm [Pyrilamine-Dextromethorphan]  Other Adverse Reaction (Add comment)     LIGHT HEADED    Other      Coban dressing caused skin break down    Tylenol W Codeine [Acetaminophen-Codeine]  Other Adverse Reaction (Add comment)     pt states that one time she had chest pains with this med, but dr. thought it was because she had not eaten        ROS negative except noted in HPI    Vitals:    12/25/23 1406   BP: 120/80   Pulse: 88   Resp: 18   Temp: 36.7 C (98.1 F)   SpO2: 97%   Weight: 96.5 kg (212 lb 12.8 oz)   Height: 1.727 m (5\' 8" )   BMI: 32.36       Physical Exam  Vitals reviewed.   Constitutional:       General: She is not in acute distress.     Appearance: Normal appearance. She is not toxic-appearing.   HENT:      Head: Normocephalic and atraumatic.      Right Ear: Tympanic membrane is erythematous.      Nose:      Right Turbinates: Enlarged.      Left Turbinates: Enlarged.      Mouth/Throat:      Mouth: Mucous membranes are moist.   Eyes:      Extraocular Movements: Extraocular movements intact.      Pupils:  Pupils are equal, round, and reactive to light.   Cardiovascular:      Rate and Rhythm: Normal rate and regular rhythm.      Heart sounds: Normal heart sounds.   Pulmonary:      Effort: Pulmonary effort is normal.  Breath sounds: Normal breath sounds.   Abdominal:      Palpations: Abdomen is soft.      Tenderness: There is no abdominal tenderness.   Skin:     General: Skin is warm and dry.   Neurological:      Mental Status: She is alert and oriented to person, place, and time.   Psychiatric:         Mood and Affect: Mood normal.         Behavior: Behavior normal.          Jacinto Halim D Mayu Ronk, DO

## 2023-12-25 NOTE — Nursing Note (Addendum)
12/25/23 1400   Required: Location Test Performed At:   [facility] (Office) - [address] [place], [place] [address]   Rapid Flu   Time Performed 1439   Rapid Flu A Result Negative   Rapid Flu B Result Negative   Lot# [id number]   Expiration Date 07/07/25   Internal Control Valid yes   Initials mc   Influenza Culture Sent No   POCT Instrument Liat     Covid 19 test    [id number]    Exp:06/06/2025    Result: negative

## 2023-12-29 ENCOUNTER — Telehealth (INDEPENDENT_AMBULATORY_CARE_PROVIDER_SITE_OTHER): Payer: Self-pay | Admitting: Family Medicine

## 2023-12-29 ENCOUNTER — Other Ambulatory Visit (INDEPENDENT_AMBULATORY_CARE_PROVIDER_SITE_OTHER): Payer: Self-pay | Admitting: Family Medicine

## 2023-12-29 DIAGNOSIS — F17200 Nicotine dependence, unspecified, uncomplicated: Secondary | ICD-10-CM

## 2023-12-29 NOTE — Telephone Encounter (Signed)
TOC needs completed  [facility] d/c 12/26/2023 (stroke)   F/u with Dr Pleas Patricia 01/05/2024  Mervyn Skeeters

## 2023-12-29 NOTE — Telephone Encounter (Signed)
Transition of Care Contact Information  Discharge Date: 12/26/2023  Transition Facility Type--Skilled Nursing Facility  Facility Name--[facility] - [place], [place]  Interactive Contact(s): Completed or attempted contact indicated by Date/Time  Completed Contact: 12/29/2023  1:44 PM  Contact Method(s)-- Patient/Caregiver Telephone  Clinical Staff Name/Role who Clyda Greener, LPN  Transition Assessment  Discharge Summary obtained?--Yes  How are you recovering?--Improving  Discharge Meds obtained?  Discharge medication changes reviewed?--Yes  Full Medication Reconciliation Completed?--No  Medication understanding --knows purpose of medication--has assistance managing medications  Medication Concerns?--No  Have everything needed for recovery?--Yes  Care Coordination:   Patient has transition follow-up appointment date and time?--Yes  Follow up appointment date:--01/05/2024  Specialist Transition Visit planned?--Yes  Specialist Transition Visit date:  Patient/caregiver plans to attend transition visit?--Yes  Primary Follow-up Barrier  Interventions provided --follow-up appointment date/time reinforced--patient expresses understanding of follow-up plan  Home Health or DME ordered at discharge?  Clinician/Team notified?  Primary reason clinician notified?

## 2023-12-29 NOTE — Telephone Encounter (Signed)
Pended for approval.     Egbert Garibaldi, LPN

## 2023-12-29 NOTE — Telephone Encounter (Signed)
Patient recent discharge from [facility] for stroke.  Patient states they had her on patches for smoking cessation.  They did not prescribe any when she was discharged.  Patient questioning cost.    Monique [name]

## 2023-12-30 ENCOUNTER — Encounter (INDEPENDENT_AMBULATORY_CARE_PROVIDER_SITE_OTHER): Payer: Self-pay

## 2023-12-30 MED ORDER — NICOTINE 21MG/24HR-14MG/24HR-7MG/24HR DAILY TRANSDERM PATCHES,SEQUENTL
1.0000 | MEDICATED_PATCH | Freq: Every day | TRANSDERMAL | 0 refills | Status: DC
Start: 2023-12-30 — End: 2024-01-04

## 2024-01-04 ENCOUNTER — Other Ambulatory Visit: Payer: Self-pay

## 2024-01-04 ENCOUNTER — Other Ambulatory Visit (INDEPENDENT_AMBULATORY_CARE_PROVIDER_SITE_OTHER): Payer: Self-pay | Admitting: Family Medicine

## 2024-01-04 DIAGNOSIS — I639 Cerebral infarction, unspecified: Secondary | ICD-10-CM

## 2024-01-04 DIAGNOSIS — F17200 Nicotine dependence, unspecified, uncomplicated: Secondary | ICD-10-CM

## 2024-01-04 MED ORDER — NICOTINE 21MG/24HR-14MG/24HR-7MG/24HR DAILY TRANSDERM PATCHES,SEQUENTL
1.0000 | MEDICATED_PATCH | Freq: Every day | TRANSDERMAL | 0 refills | Status: DC
Start: 2024-01-04 — End: 2024-03-13

## 2024-01-04 NOTE — Telephone Encounter (Signed)
Patient called stating per pharmacy, "insurance will not cover nicotine patient".  Pharmacy stated it would need to be submitted to [facility] part B indicating patient had stroke to be covered.    Monique [name]

## 2024-01-05 ENCOUNTER — Encounter (INDEPENDENT_AMBULATORY_CARE_PROVIDER_SITE_OTHER): Payer: Self-pay | Admitting: Family Medicine

## 2024-01-05 ENCOUNTER — Ambulatory Visit (INDEPENDENT_AMBULATORY_CARE_PROVIDER_SITE_OTHER): Payer: Commercial Managed Care - PPO | Admitting: Family Medicine

## 2024-01-05 VITALS — BP 122/70 | HR 72 | Temp 97.3°F | Resp 17 | Ht 68.0 in | Wt 203.4 lb

## 2024-01-05 DIAGNOSIS — J449 Chronic obstructive pulmonary disease, unspecified: Secondary | ICD-10-CM

## 2024-01-05 DIAGNOSIS — I639 Cerebral infarction, unspecified: Secondary | ICD-10-CM

## 2024-01-05 DIAGNOSIS — Z6830 Body mass index (BMI) 30.0-30.9, adult: Secondary | ICD-10-CM

## 2024-01-05 DIAGNOSIS — F331 Major depressive disorder, recurrent, moderate: Secondary | ICD-10-CM | POA: Insufficient documentation

## 2024-01-05 DIAGNOSIS — I70221 Atherosclerosis of native arteries of extremities with rest pain, right leg: Secondary | ICD-10-CM

## 2024-01-05 DIAGNOSIS — Z09 Encounter for follow-up examination after completed treatment for conditions other than malignant neoplasm: Secondary | ICD-10-CM

## 2024-01-05 HISTORY — DX: Cerebral infarction, unspecified: I63.9
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
## 2024-01-05 NOTE — Nursing Note (Signed)
01/05/24 1024   PHQ 9 (follow up)   Little interest or pleasure in doing things. 1   Feeling down, depressed, or hopeless 1   PHQ 2 Total 2   Trouble falling or staying asleep, or sleeping too much. 1   Feeling tired or having little energy 2   Poor appetite or overeating 1   Feeling bad about yourself/ that you are a failure in the past 2 weeks? 0   Trouble concentrating on things in the past 2 weeks? 1   Moving/Speaking slowly or being fidgety or restless  in the past 2 weeks? 1   Thoughts that you would be better off DEAD, or of hurting yourself in some way. 0   If you checked off any problems, how difficult have these problems made it for you to do your work, take care of things at home, or get along with other people? Not difficult at all   PHQ 9 Total 8   Interpretation of Total Score Mild depression

## 2024-01-05 NOTE — Progress Notes (Signed)
FAMILY MEDICINE, [facility] PRIMARY CARE  [address]  [place] [address]    Transitional Care Management Note    Name: Monique [name] MRN:  [id number]   Date: 01/05/2024 Age: 58 y.o.     Chief Complaint: Hospital Discharge Transition (Patient seen in [facility] for stroke, then transferred to [facility] for PT and discharged 12/26/23 ) and Hospital Discharge Transition       SUBJECTIVE:    Monique [name] is a 58 y.o. female presenting today for follow-up after being discharged. The main problem requiring admission was CVA.    I review her records from [facility].  I have difficulty isolating a discharge summary.  I review with the patient course of care.  Initial H and P included the following:     "Monique [name] is a 58 y.o. female with a history of NIDDM, CAD status post CABG, hyperlipidemia, active tobacco use, COPD, history of CVA, who presented to [facility] ED with left-sided weakness, slurred speech found to have chronic left ICA and severe narrowing of right ICA. Transferred to [facility] 12/25/2023 for further management.  TIA: Prior CVA. Initial symptoms as above, now back to baseline. OSH CTH and neck with chronic occlusion of the left ICA and severe narrowing of the right ICA. Multifocal severe stenosis in the A2 and ACA segments of R ACA. Per neurology, started on aspirin, permissive hypertension, MRI brain. Plan for cerebral angiogram. Neurology consulted.  Carotid stenosis: Known occlusion of left ICA.  CAD: Status post CABG 2012. S/p PCI x4.  HLD: c/b xanthomas, on crestor. Resume home meds. Repeat lipid panel.  NIDDM: Home regimen Jardiance, Ozempic, pioglitazone. Last A1c 8.9. Insulin sliding scale.   Hx of gastric sleeve: 2018  COPD: Resume home inhalers.  Tobacco use disorder: NRT   Chronic back pain: On chronic opiate therapy.  Code status: Full code confirmed on admit  DVT Prophylaxis: Defer to neurology   Current living situation: Home  Expected Disposition: Same  Estimated  discharge date: Pending neurology evaluation  Admitted with these risk variables:None. Please see assessment and plan for further details.  Chief Complaint:  Slurred speech and left arm weakness  History of Present Illness:  Patient with similar symptoms during her prior CVA, no residual symptoms. She did report that she was having some left-sided weakness earlier this week. Symptoms resolved by the time she was evaluated in the ED.  On arrival here, patient is back to baseline without any symptoms. Denies any complaints.  Admitted to stroke unit for further assessment by neurology, plan for angiogram in the morning. " -Star Age, MD at 12/26/2023 4:20 AM EST       Neurology sign off note:     "Neurology Sign-Off  Diagnosis: RIGHT Frontal lobe stroke  Intracranial RIGHT ICA/MCA/ACA stenosis  RIGHT ICA origin stenosis  Chronic LEFT ICA occlusion  Extensive intracranial atherosclerotic disease  Tobacco use  Uncontrolled DM2, Hld, Obesity  Etiology: large artery intracranial athero  Tests Pending: None  Discharge Medications & Treatments: Aspirin 325mg  daily + Plavix 75 mg daily until follow up  Lipitor 80mg  daily  Additional Recommendations: Goal SBP 120-160 until follow up; AVOID hypotension  Follow-up Testing (After Discharge): None  Follow-up Appointment: In Stroke Prevention Clinic. See Discharge Tab/AVS for details.  Recall: If questions.  If worsening neurologic exam.    Electronically signed by Monique Alar, CNP at 12/27/2023 1:35 PM EST "      She had neurology following interventional Radiology following  speech therapy occupational therapy, physical therapy following.  Ultimately she was discharged home on aspirin and Plavix.  Stress compliance with blood pressure medications to maintain a systolic blood pressure between 12/28/2058.  Recommended following up in the stroke prevention Clinic.      OBJECTIVE:     BP 122/70   Pulse 72   Temp 36.3 C (97.3 F)   Resp 17   Ht 1.727 m (5\' 8" )   Wt  92.3 kg (203 lb 6.4 oz)   LMP  (LMP Unknown)   SpO2 96%   BMI 30.93 kg/m       Physical Examination:    GENERAL:   Pt is a pleasant, well-nourished, well-developed 58 y.o. female who is in NAD. Appears stated age  HEENT:  head normocephalic, symmetrical facies. EOM intact b/l. PERRLA. Sclera non-icteric, non-injected. upper eyelid w/Xythoma-lipid plaques, diminished in fullness and intensity. Oropharyngeal mucous membranes are moist  w/o erythema/exudates   NECK:   No masses, no lymphadenopathy, no JVD on exam. Trachea midline, no thyromegaly   CV: S1, S2. No murmurs, rubs, gallops.     LUNGS: CTAB, No rhonchi, rales, wheezes.   GI: (+) BS in all 4 quadrants. soft, NT/ND. No rigidity/positive for guarding/negative for rebound. No organomegaly/masses, or abdominal bruits,   MSK: Spontaneous normal AROM of major joints as observed during exam. No joint effusions/ swelling/ deformities.   Fourth digit he of the left lower extremity is swollen and erythematous.  Tender to touch.  No BLE edema, clubbing, or cyanosis.   2+ radial pulse present b/l.   + 2 reflexes Brachioradialis and patellar bilaterally.   Gait normal.  SKIN: No significant lesions, rashes, ecchymoses noted.   NEURO: AAOx4, CN grossly intact. Sensation intact b/l. No focal deficits.  PSYCH: Mood, behavior and affect normal w/ Intact judgement & insight  Exam stable.      Recent labs and imaging reviewed as well.    Transition of Care Contact Information    Discharge date: Discharge Date: 12/26/2023  Transition Facility Type--Skilled Nursing Facility  Facility Name--[facility] [place] [place] Interactive Contact(s):  Completed Contact: 12/29/2023  1:44 PM  Contact Method(s)-- Patient/Caregiver Telephone  Clinical Staff Name/Role who Clyda Greener, LPN     Data Reviewed  Medication Reconciliation completed    Assessment and Plan      ICD-10-CM    1. Hospital discharge follow-up  Z09       2. Moderate episode of recurrent major  depressive disorder (CMS HCC)  F33.1       3. Atherosclerosis of native artery of right lower extremity with rest pain (CMS HCC)  I70.221       4. Chronic obstructive pulmonary disease, unspecified COPD type (CMS HCC)  J44.9       5. Morbid obesity (CMS HCC)  E66.01         Other transition actions (Optional) -: Discharge documentation was reviewed, No pending tests or treatments., Education provided to patient-family-caregiver about how to access care or advice , Durable medical equipment is not needed., and [employer] additional services are needed or desired      CVA:   Continue on aspirin and Plavix.  Continue on statin therapy.    Continue tight control of hypertension.    Continue behavioral efforts including diet and exercise.  We discuss smoking cessation.  Goal of less than 140 systolic.  Refills on request.      Return in 3 months for follow-up of chronic conditions.  Return in about 3 months (around 04/04/2024), or if symptoms worsen or fail to improve, for In Person Visit, chronic disease management.    Oneta Rack, MD    This note was partially generated using MModal Fluency Direct system, and there may be some incorrect words, spellings, and punctuation that were not noted in checking the note before saving.

## 2024-01-11 ENCOUNTER — Telehealth (INDEPENDENT_AMBULATORY_CARE_PROVIDER_SITE_OTHER): Payer: Self-pay | Admitting: Family Medicine

## 2024-01-13 NOTE — Telephone Encounter (Signed)
Patient asking if authorization for nicotine patches is complete.    Please call.    Monique [name]

## 2024-01-18 ENCOUNTER — Other Ambulatory Visit: Payer: Self-pay

## 2024-01-18 ENCOUNTER — Encounter (INDEPENDENT_AMBULATORY_CARE_PROVIDER_SITE_OTHER): Payer: Self-pay

## 2024-01-18 ENCOUNTER — Ambulatory Visit (INDEPENDENT_AMBULATORY_CARE_PROVIDER_SITE_OTHER): Payer: Commercial Managed Care - PPO

## 2024-01-18 VITALS — BP 136/72 | HR 69 | Temp 97.3°F | Resp 18 | Ht 66.0 in | Wt 208.3 lb

## 2024-01-18 DIAGNOSIS — J329 Chronic sinusitis, unspecified: Secondary | ICD-10-CM

## 2024-01-18 MED ORDER — FLUTICASONE PROPIONATE 50 MCG/ACTUATION NASAL SPRAY,SUSPENSION
2.0000 | Freq: Every day | NASAL | 0 refills | Status: DC
Start: 2024-01-18 — End: 2024-01-28

## 2024-01-18 MED ORDER — AMOXICILLIN 875 MG-POTASSIUM CLAVULANATE 125 MG TABLET
1.0000 | ORAL_TABLET | Freq: Two times a day (BID) | ORAL | 0 refills | Status: DC
Start: 2024-01-18 — End: 2024-01-28

## 2024-01-18 NOTE — Progress Notes (Signed)
[address]  [address]  [place] [address]    History and Physical    Name: Monique [name] MRN:  [id number]   Date: 01/18/2024 DOB:  05-23-1966 (58 y.o.)              Subjective:     Chief Complaint:    Chief Complaint   Patient presents with    Sore Throat    Headache    Itchy Ears    Cough    Congestion    Fatigue    Laryngitis     X 5 days       HPI:  Lashan Macias is a 58 y.o. y/o female who presents today for the above-stated symptoms over the last 5-6 days.  Patient denies any known sick contacts.  Denies any fevers or chills.  She is taking no over-the-counter medications for her symptoms.    Past Medical History:   Diagnosis Date    Anxiety     Arthritis     Back problem     DDD    Beta blocker prescribed for left ventricular systolic dysfunction     Blood thinned due to long-term anticoagulant use     Carotid stenosis     occlusion of left ICA    Chest pain 04/03/2018    Chronic back pain     Chronic obstructive pulmonary disease, unspecified COPD type (CMS HCC) 12/31/2022    Coronary artery disease involving native coronary artery of native heart     CVA (cerebral vascular accident) (CMS HCC)     DDD (degenerative disc disease), lumbar     "entire spine"    Ear piercing     GERD (gastroesophageal reflux disease)     controlled with omeprazole    H/O complete eye exam     2 years, Dr. Thompson Caul, at [facility]    History of dental examination     dentures upper and lower    HTN     Hx of coronary artery bypass graft     2012    Hx of gastric bypass     Hypercholesterolemia     Hyperlipidemia     Hypothyroid     MI (myocardial infarction) (CMS HCC) 2012    Neck problem     herniated cervical disc    Obesity (BMI 30-39.9)     Peripheral vascular disease, unspecified (CMS HCC) 01/03/2021    Solitary nodule of right lobe of thyroid 03/30/2018    Incidental finding on MRI thoracic spine. [place] ordered.     Stented coronary artery     x4    Stroke (CMS [facility])     2002 and again 2025    Tattoo      Left breast, Right shoulder    Thyroid disorder     Thyroid nodule     Type 2 diabetes mellitus (CMS HCC)     HGA1C 10.4 on 12/19/21    Uncontrolled type 2 diabetes mellitus, without long-term current use of insulin     Xanthelasma           Past Surgical History:   Procedure Laterality Date    ENDOMETRIAL ABLATION  1998    HX ANKLE FRACTURE TX  1990s    Right, [name] procedure    HX CATARACT REMOVAL Bilateral 2013    r and l eyes with implants    HX CESAREAN SECTION  06/20/2000    x3, 11/21/86, 11/10/84  HX CHOLECYSTECTOMY  1988    HX CORONARY ARTERY BYPASS GRAFT  03/31/2011    cardiac, 2 vessel, Dr. Francesco Runner, [facility]    HX CORONARY STENT PLACEMENT  2012    x4 stents    HX GASTRIC SLEEVE  02/2017    [place], [place], Dr. Devin Going HEART CATHETERIZATION  2012    massive heart attack and stents - [facility]    HX THYROID BIOPSY      HX THYROIDECTOMY  101/01/2018    HX TONSILLECTOMY      as child    HX YAG Left 09/20/2013            Current Outpatient Medications   Medication Sig    amoxicillin-pot clavulanate (AUGMENTIN) 875-125 mg Oral Tablet Take 1 Tablet by mouth Twice daily for 7 days    aspirin (ECOTRIN) 81 mg Oral Tablet, Delayed Release (E.C.) Take 1 Tablet (81 mg total) by mouth Once a day    atorvastatin (LIPITOR) 80 mg Oral Tablet Take 1 Tablet (80 mg total) by mouth    BD PRECISIONGLIDE 25 gauge x 1" Needle USE TO INJECT EVERY 30 DAYS    Blood Sugar Diagnostic (ACCU-CHEK GUIDE TEST STRIPS) Does not apply Strip TEST BLOOD SUGAR THREE TIMES DAILY    Blood-Glucose Meter (ACCU-CHEK GUIDE GLUCOSE METER) Misc USE AS DIRECTED    clopidogreL (PLAVIX) 75 mg Oral Tablet TAKE 1 TABLET EVERY DAY    cyanocobalamin (VITAMIN B12) 1,000 mcg/mL Injection Solution INJECT 1 ML (1,000 MCG TOTAL) UNDER THE SKIN EVERY 30 DAYS    docusate sodium (COLACE) 100 mg Oral Capsule TAKE 1 CAPSULE TWICE DAILY    empagliflozin (JARDIANCE) 10 mg Oral Tablet Take 1 Tablet (10 mg total) by mouth Once a day    ergocalciferol, vitamin  D2, (DRISDOL) 1,250 mcg (50,000 unit) Oral Capsule TAKE 1 CAPSULE BY MOUTH EVERY WEEK    ezetimibe (ZETIA) 10 mg Oral Tablet Take 1 Tablet (10 mg total) by mouth Every evening    fluticasone propionate (FLONASE) 50 mcg/actuation Nasal Spray, Suspension Administer 2 Sprays into each nostril Once a day    fluticasone propionate (FLONASE) 50 mcg/actuation Nasal Spray, Suspension Administer 2 Sprays into each nostril Once a day for 30 days    HYDROcodone-acetaminophen (NORCO) 10-325 mg Oral Tablet Take 1 Tablet by mouth Every 8 hours as needed for Pain Reuben Likes with [facility]    Ibuprofen (MOTRIN) 800 mg Oral Tablet TAKE ONE TABLET BY MOUTH THREE TIMES DAILY AS NEEDED FOR PAIN WITH FOOD    Lancets (ACCU-CHEK SOFTCLIX LANCETS) Misc USE AS DIRECTED DAILY    levothyroxine (SYNTHROID) 25 mcg Oral Tablet TAKE 1 TABLET ONE TIME DAILY    metoprolol succinate (TOPROL-XL) 25 mg Oral Tablet Sustained Release 24 hr Take 0.5 Tablets (12.5 mg total) by mouth Once a day    NARCAN 4 mg/actuation Nasal Spray, Non-Aerosol PT STATED SHE HAS NARCAN AT HOME BUT IS NOT TAKING IT    niacin (NIASPAN) 500 mg Oral Tablet Sustained Release 24 hr TAKE 1 TABLET EVERY DAY    Nicotine 21-14-7 mg/24 hr Transdermal Patch Daily, Sequential Place 1 Each on the skin Once a day Beginning with lowest dose    nitroGLYCERIN (NITROSTAT) 0.4 mg Sublingual Tablet, Sublingual Place 1 Tablet (0.4 mg total) under the tongue Every 5 minutes as needed for Chest pain for 3 doses over 15 minutes    omeprazole (PRILOSEC) 40 mg Oral Capsule, Delayed Release(E.C.) TAKE 1 CAPSULE EVERY  DAY    ondansetron (ZOFRAN ODT) 8 mg Oral Tablet, Rapid Dissolve DISSOLVE 1 TABLET ON THE TONGUE EVERY 8 HOURS AS NEEDED FOR NAUSEA AND VOMITING    OZEMPIC 2 mg/dose (8 mg/3 mL) Subcutaneous Pen Injector Inject 2 mg under the skin Every 7 days    pioglitazone (ACTOS) 15 mg Oral Tablet Take 1 Tablet (15 mg total) by mouth Once a day    PRENATAL VITAMIN PLUS LOW IRON  27 mg iron- 1 mg Oral Tablet TAKE 1 TABLET EVERY DAY    venlafaxine (EFFEXOR XR) 37.5 mg Oral Capsule, Sust. Release 24 hr TAKE 1 CAPSULE ONE TIME DAILY    ZYRTEC-D 5-120 mg Oral Tablet Sustained Release 12 hr Take 1 Tablet by mouth Once a day      Allergies   Allergen Reactions    Capron Dm [Pyrilamine-Dextromethorphan]  Other Adverse Reaction (Add comment)     LIGHT HEADED    Other      Coban dressing caused skin break down    Tylenol W Codeine [Acetaminophen-Codeine]  Other Adverse Reaction (Add comment)     pt states that one time she had chest pains with this med, but dr. thought it was because she had not eaten        ROS:  Review of Systems   Constitutional:  Positive for malaise/fatigue.   HENT:  Positive for congestion, ear pain and sore throat.    Respiratory:  Positive for cough.    Neurological:  Positive for headaches.   All other systems reviewed and are negative.       Objective:   Physical Exam  Vitals and nursing note reviewed.   Constitutional:       General: She is not in acute distress.     Appearance: Normal appearance. She is not ill-appearing.   HENT:      Head: Normocephalic.      Right Ear: Tympanic membrane, ear canal and external ear normal.      Left Ear: Tympanic membrane, ear canal and external ear normal.      Ears:      Comments: Trace amount of clear middle ear effusion bilaterally     Nose: Congestion present.      Right Sinus: Maxillary sinus tenderness present.      Left Sinus: Maxillary sinus tenderness present.      Mouth/Throat:      Pharynx: No oropharyngeal exudate or posterior oropharyngeal erythema.   Cardiovascular:      Rate and Rhythm: Normal rate and regular rhythm.      Heart sounds: Normal heart sounds.   Pulmonary:      Effort: Pulmonary effort is normal.      Breath sounds: Normal breath sounds. No stridor. No wheezing or rhonchi.   Lymphadenopathy:      Cervical: No cervical adenopathy.   Skin:     General: Skin is warm and dry.      Capillary Refill: Capillary  refill takes less than 2 seconds.   Neurological:      General: No focal deficit present.      Mental Status: She is alert and oriented to person, place, and time.   Psychiatric:         Mood and Affect: Mood normal.         Behavior: Behavior normal.         Vitals:    01/18/24 1545   BP: 136/72   Pulse: 69   Resp: 18  Temp: 36.3 C (97.3 F)   TempSrc: Tympanic   SpO2: 98%   Weight: 94.5 kg (208 lb 5.4 oz)   Height: 1.676 m (5\' 6" )   BMI: 33.63     Assessment & Plan:       ICD-10-CM    1. Sinusitis, unspecified chronicity, unspecified location  J32.9 amoxicillin-pot clavulanate (AUGMENTIN) 875-125 mg Oral Tablet     fluticasone propionate (FLONASE) 50 mcg/actuation Nasal Spray, Suspension           Orders Placed This Encounter    amoxicillin-pot clavulanate (AUGMENTIN) 875-125 mg Oral Tablet    fluticasone propionate (FLONASE) 50 mcg/actuation Nasal Spray, Suspension      Follow up with PCP as directed. For new or worsening symptoms, evaluation at your nearest Emergency Department may be needed.    Oren Beckmann, APRN 01/18/2024 16:14     I saw this patient independently.   This note was partially generated using MModal Fluency Direct system, and there may be some incorrect words, spellings, and punctuation that were not noted in checking the note before saving.

## 2024-01-19 ENCOUNTER — Telehealth (INDEPENDENT_AMBULATORY_CARE_PROVIDER_SITE_OTHER): Payer: Self-pay | Admitting: Family Medicine

## 2024-01-19 NOTE — Telephone Encounter (Signed)
[employer] Electronic PA Form  Medication:Nicotine 21-14-7 mg/24 hr Transdermal Patch Daily, Sequential More Information     CMS Excluded OTC 103: You asked for an over-the-counter (OTC) drug. The Medicare rule in the Prescription Drug Benefit Manual (Chapter 6, Section 10.10) says OTC drugs are excluded from Part D coverage. Per Medicare rules, this drug cannot be covered. (Click CANCEL or DELETE if this coverage request is no longer needed.)    Kevona Lupinacci Joanne Gavel, RN  01/19/2024, 08:28

## 2024-01-19 NOTE — Telephone Encounter (Signed)
Pt's wife notified and verbalized understanding.   Rae Halsted, RN  01/19/2024, 08:30

## 2024-01-19 NOTE — Telephone Encounter (Signed)
Pt has been inquiring about PA for Nicotine patch. PA initiated through Epic.   Rae Halsted, RN  01/19/2024, 08:11

## 2024-01-25 ENCOUNTER — Telehealth (INDEPENDENT_AMBULATORY_CARE_PROVIDER_SITE_OTHER): Payer: Self-pay

## 2024-01-25 ENCOUNTER — Other Ambulatory Visit (INDEPENDENT_AMBULATORY_CARE_PROVIDER_SITE_OTHER): Payer: Self-pay | Admitting: Family Medicine

## 2024-01-25 DIAGNOSIS — K219 Gastro-esophageal reflux disease without esophagitis: Secondary | ICD-10-CM

## 2024-01-25 NOTE — Telephone Encounter (Signed)
 Left message reminding patient of upcoming appointment in vascular surgery office.      Gabriela Eves, MA

## 2024-01-25 NOTE — Telephone Encounter (Signed)
Last scheduled appointment with you was 01/05/2024.  Currently scheduled future appointment is 03/29/2024.      Egbert Garibaldi, LPN  7/84/6962, 15:03

## 2024-01-26 ENCOUNTER — Ambulatory Visit (INDEPENDENT_AMBULATORY_CARE_PROVIDER_SITE_OTHER): Payer: Commercial Managed Care - PPO | Admitting: Family

## 2024-01-26 ENCOUNTER — Encounter (INDEPENDENT_AMBULATORY_CARE_PROVIDER_SITE_OTHER): Payer: Self-pay

## 2024-01-26 ENCOUNTER — Other Ambulatory Visit: Payer: Self-pay

## 2024-01-26 ENCOUNTER — Other Ambulatory Visit (INDEPENDENT_AMBULATORY_CARE_PROVIDER_SITE_OTHER): Payer: Self-pay | Admitting: Family Medicine

## 2024-01-26 ENCOUNTER — Ambulatory Visit: Payer: Commercial Managed Care - PPO | Attending: PHYSICIAN ASSISTANT

## 2024-01-26 VITALS — BP 112/58 | Ht 66.0 in | Wt 205.6 lb

## 2024-01-26 DIAGNOSIS — I6523 Occlusion and stenosis of bilateral carotid arteries: Secondary | ICD-10-CM | POA: Insufficient documentation

## 2024-01-26 DIAGNOSIS — F17210 Nicotine dependence, cigarettes, uncomplicated: Secondary | ICD-10-CM

## 2024-01-26 DIAGNOSIS — M4696 Unspecified inflammatory spondylopathy, lumbar region: Secondary | ICD-10-CM

## 2024-01-26 DIAGNOSIS — E1165 Type 2 diabetes mellitus with hyperglycemia: Secondary | ICD-10-CM

## 2024-01-26 DIAGNOSIS — I639 Cerebral infarction, unspecified: Secondary | ICD-10-CM

## 2024-01-26 NOTE — Progress Notes (Signed)
VASCULAR SURGERY [facility], MEDICAL OFFICE [place]  [address]  [place] [address]    Name: Monique [name] MRN:  [id number]   Date: 01/26/2024 Age: 58 y.o.     Referring Provider: Oneta Rack    Chief Complaint: Follow-up After Testing (Annual follow up, ultrasound carotids today)    History of Present Illness:   I had the pleasure of seeing Kara Melching today in follow up for carotid stenosis. She reports a hospitalization approximately a month ago at [place] and then transferred to [facility]. She states that her symptoms began with slurred speech. She was seen by neurology and interventional radiology. No surgical intervention was indicated, and she was medically optimized and discharged home. She reports improvement to her symptoms and has no new or worsening focal neurosensory motor deficits, dysarthria or amaurosis.     Past Surgical History:   Procedure Laterality Date    ENDOMETRIAL ABLATION  1998    HX ANKLE FRACTURE TX  1990s    Right, [name] procedure    HX CATARACT REMOVAL Bilateral 2013    r and l eyes with implants    HX CESAREAN SECTION  06/20/2000    x3, 11/21/86, 11/10/84    HX CHOLECYSTECTOMY  1988    HX CORONARY ARTERY BYPASS GRAFT  03/31/2011    cardiac, 2 vessel, Dr. Francesco Runner, [facility]    HX CORONARY STENT PLACEMENT  2012    x4 stents    HX GASTRIC SLEEVE  02/2017    [place], [place], Dr. Devin Going HEART CATHETERIZATION  2012    massive heart attack and stents - [facility]    HX THYROID BIOPSY      HX THYROIDECTOMY  101/01/2018    HX TONSILLECTOMY      as child    HX YAG Left 09/20/2013         Allergies   Allergen Reactions    Capron Dm [Pyrilamine-Dextromethorphan]  Other Adverse Reaction (Add comment)     LIGHT HEADED    Other      Coban dressing caused skin break down    Tylenol W Codeine [Acetaminophen-Codeine]  Other Adverse Reaction (Add comment)     pt states that one time she had chest pains with this med, but dr. thought it was because  she had not eaten     Patient Active Problem List    Diagnosis    Moderate episode of recurrent major depressive disorder (CMS HCC)    Closed nondisplaced fracture of distal phalanx of left little finger    Tobacco use disorder    Weakness    Atherosclerosis of native artery of right lower extremity with rest pain (CMS HCC)    Chronic obstructive pulmonary disease, unspecified COPD type (CMS HCC)    Unspecified inflammatory spondylopathy, lumbar region (CMS HCC)    Other specified disorders of adrenal gland    Left leg weakness    Dizziness    Chronic lumbar radiculopathy    Hypertension associated with type 2 diabetes mellitus (CMS HCC)  (CMS HCC)    Type 2 diabetes mellitus with complication, without long-term current use of insulin (CMS HCC)    Major depressive disorder, recurrent, unspecified    Peripheral vascular disease, unspecified (CMS HCC)    Fracture of phalanx of finger of right hand    Spinal pain    Type 2 diabetes mellitus with hyperglycemia (CMS HCC)    Upper respiratory infection  Thyroid disorder    UDS: 07/28/19    Sinus congestion    Morbid obesity (CMS HCC)    Type 2 diabetes mellitus with hyperosmolarity without coma, without long-term current use of insulin (CMS HCC)    Coronary artery disease involving native coronary artery of native heart    GERD (gastroesophageal reflux disease)    HTN (hypertension)    Hyperlipidemia    Constipation    Thyroid nodule    Chest pain    DDD (degenerative disc disease), lumbar    Anxiety    Chronic back pain    Carotid stenosis    BMI 32.0-32.9,adult    H/O complete eye exam    S/P CABG x 2    Xanthelasma    Hypercholesterolemia     Past Medical History:   Diagnosis Date    Anxiety     Arthritis     Back problem     DDD    Beta blocker prescribed for left ventricular systolic dysfunction     Blood thinned due to long-term anticoagulant use     Carotid stenosis     occlusion of left ICA    Chest pain 04/03/2018    Chronic back pain     Chronic obstructive  pulmonary disease, unspecified COPD type (CMS HCC) 12/31/2022    Coronary artery disease involving native coronary artery of native heart     CVA (cerebral vascular accident) (CMS HCC)     DDD (degenerative disc disease), lumbar     "entire spine"    Ear piercing     GERD (gastroesophageal reflux disease)     controlled with omeprazole    H/O complete eye exam     2 years, Dr. Thompson Caul, at [facility]    History of dental examination     dentures upper and lower    HTN     Hx of coronary artery bypass graft     2012    Hx of gastric bypass     Hypercholesterolemia     Hyperlipidemia     Hypothyroid     MI (myocardial infarction) (CMS HCC) 2012    Neck problem     herniated cervical disc    Obesity (BMI 30-39.9)     Peripheral vascular disease, unspecified (CMS HCC) 01/03/2021    Solitary nodule of right lobe of thyroid 03/30/2018    Incidental finding on MRI thoracic spine. [place] ordered.     Stented coronary artery     x4    Stroke (CMS [facility])     2002 and again 2025    Stroke (CMS [facility]) 01/05/2024    treated at [facility] in [place], [place]    Tattoo     Left breast, Right shoulder    Thyroid disorder     Thyroid nodule     Type 2 diabetes mellitus (CMS HCC)     HGA1C 10.4 on 12/19/21    Uncontrolled type 2 diabetes mellitus, without long-term current use of insulin     Xanthelasma          Social History     Tobacco Use    Smoking status: Every Day     Average packs/day: 0.5 packs/day for 44.0 years (21.8 ttl pk-yrs)     Types: Cigarettes     Start date: 74    Smokeless tobacco: Never   Substance Use Topics    Alcohol use: No      Current Outpatient Medications   Medication Sig  aspirin (ECOTRIN) 81 mg Oral Tablet, Delayed Release (E.C.) Take 1 Tablet (81 mg total) by mouth Once a day    atorvastatin (LIPITOR) 80 mg Oral Tablet Take 1 Tablet (80 mg total) by mouth    BD PRECISIONGLIDE 25 gauge x 1" Needle USE TO INJECT EVERY 30 DAYS    Blood Sugar Diagnostic (ACCU-CHEK GUIDE TEST STRIPS) Does not apply Strip TEST BLOOD  SUGAR THREE TIMES DAILY    Blood-Glucose Meter (ACCU-CHEK GUIDE GLUCOSE METER) Misc USE AS DIRECTED    clopidogreL (PLAVIX) 75 mg Oral Tablet TAKE 1 TABLET EVERY DAY    cyanocobalamin (VITAMIN B12) 1,000 mcg/mL Injection Solution INJECT 1 ML (1,000 MCG TOTAL) UNDER THE SKIN EVERY 30 DAYS    docusate sodium (COLACE) 100 mg Oral Capsule TAKE 1 CAPSULE TWICE DAILY    empagliflozin (JARDIANCE) 10 mg Oral Tablet Take 1 Tablet (10 mg total) by mouth Once a day    ergocalciferol, vitamin D2, (DRISDOL) 1,250 mcg (50,000 unit) Oral Capsule TAKE 1 CAPSULE BY MOUTH EVERY WEEK    ezetimibe (ZETIA) 10 mg Oral Tablet Take 1 Tablet (10 mg total) by mouth Every evening    fluticasone propionate (FLONASE) 50 mcg/actuation Nasal Spray, Suspension Administer 2 Sprays into each nostril Once a day    fluticasone propionate (FLONASE) 50 mcg/actuation Nasal Spray, Suspension Administer 2 Sprays into each nostril Once a day for 30 days    HYDROcodone-acetaminophen (NORCO) 10-325 mg Oral Tablet Take 1 Tablet by mouth Every 8 hours as needed for Pain Reuben Likes with [facility]    Ibuprofen (MOTRIN) 800 mg Oral Tablet TAKE ONE TABLET BY MOUTH THREE TIMES DAILY AS NEEDED FOR PAIN WITH FOOD    Lancets (ACCU-CHEK SOFTCLIX LANCETS) Misc USE AS DIRECTED DAILY    levothyroxine (SYNTHROID) 25 mcg Oral Tablet TAKE 1 TABLET ONE TIME DAILY    metoprolol succinate (TOPROL-XL) 25 mg Oral Tablet Sustained Release 24 hr Take 0.5 Tablets (12.5 mg total) by mouth Once a day    NARCAN 4 mg/actuation Nasal Spray, Non-Aerosol PT STATED SHE HAS NARCAN AT HOME BUT IS NOT TAKING IT    niacin (NIASPAN) 500 mg Oral Tablet Sustained Release 24 hr TAKE 1 TABLET EVERY DAY    Nicotine 21-14-7 mg/24 hr Transdermal Patch Daily, Sequential Place 1 Each on the skin Once a day Beginning with lowest dose    nitroGLYCERIN (NITROSTAT) 0.4 mg Sublingual Tablet, Sublingual Place 1 Tablet (0.4 mg total) under the tongue Every 5 minutes as needed for Chest pain  for 3 doses over 15 minutes    omeprazole (PRILOSEC) 40 mg Oral Capsule, Delayed Release(E.C.) TAKE 1 CAPSULE EVERY DAY    ondansetron (ZOFRAN ODT) 8 mg Oral Tablet, Rapid Dissolve DISSOLVE 1 TABLET ON THE TONGUE EVERY 8 HOURS AS NEEDED FOR NAUSEA AND VOMITING    OZEMPIC 2 mg/dose (8 mg/3 mL) Subcutaneous Pen Injector Inject 2 mg under the skin Every 7 days    pioglitazone (ACTOS) 15 mg Oral Tablet Take 1 Tablet (15 mg total) by mouth Once a day    PRENATAL VITAMIN PLUS LOW IRON 27 mg iron- 1 mg Oral Tablet TAKE 1 TABLET EVERY DAY    venlafaxine (EFFEXOR XR) 37.5 mg Oral Capsule, Sust. Release 24 hr TAKE 1 CAPSULE ONE TIME DAILY    ZYRTEC-D 5-120 mg Oral Tablet Sustained Release 12 hr Take 1 Tablet by mouth Once a day     Family Medical History:       Problem Relation (Age  of Onset)    Atrial fibrillation Sister    Colon Cancer Father    Diabetes Mother, Maternal Aunt    Heart Disease Sister, Maternal Grandfather    Hypertension (High Blood Pressure) Mother    Lung Cancer Maternal Grandfather    MI <88 years of age Mother            Review of Systems:  As per HPI.    Physical Examination:  BP (!) 112/58 (Site: Left Arm, Patient Position: Sitting, Cuff Size: Adult)   Ht 1.676 m (5\' 6" )   Wt 93.3 kg (205 lb 9.6 oz)   LMP  (LMP Unknown)   BMI 33.18 kg/m   GENERAL: The patient is alert and oriented, does not appear to be in any acute distress.  HEENT: Pupils are equal, round and reactive. Extraocular movements are intact.  NEUROLOGICALLY: Cranial nerves II-XII are grossly intact. No motor sensory deficits.  HEART: Regular rate and rhythm. No murmurs present.  LUNGS: Clear to auscultation bilaterally.  ABDOMEN: Soft, nontender and nondistended.   NECK: No bruits noted over the carotids.  LOWER EXTREMITIES: Warm and well perfused.    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 The patient is on good medical therapy, however, does continue to smoke. We have discussed the importance of smoking cessation in the presence of vascular disease. She will continue to follow with neurology as outpatient. We will see her back in 1 year with repeat testing.     Timiko Offutt, APRN,FNP-BC    I personally performed the services described in this documentation, as scribed  in my presence, and it is both accurate  and complete.    Nesa Distel, APRN,FNP-BC    On the day of the encounter, a total of 30 minutes was spent on this patient encounter including review of historical information, examination, documentation and post-visit activities. The time documented excludes procedural time.    Smoking cessation counseling was done for  < 10 minutes.       D: 01/26/2024   Non-template dictation transcribed by Verdie Drown Reamer 13:52

## 2024-01-28 ENCOUNTER — Other Ambulatory Visit: Payer: Self-pay

## 2024-01-28 ENCOUNTER — Encounter (INDEPENDENT_AMBULATORY_CARE_PROVIDER_SITE_OTHER): Payer: Self-pay | Admitting: NURSE PRACTITIONER

## 2024-01-28 ENCOUNTER — Ambulatory Visit (INDEPENDENT_AMBULATORY_CARE_PROVIDER_SITE_OTHER): Payer: Commercial Managed Care - PPO | Admitting: NURSE PRACTITIONER

## 2024-01-28 VITALS — BP 112/64 | HR 96 | Temp 99.8°F | Resp 18 | Ht 66.0 in | Wt 210.8 lb

## 2024-01-28 DIAGNOSIS — J988 Other specified respiratory disorders: Secondary | ICD-10-CM

## 2024-01-28 DIAGNOSIS — R509 Fever, unspecified: Secondary | ICD-10-CM

## 2024-01-28 DIAGNOSIS — H66002 Acute suppurative otitis media without spontaneous rupture of ear drum, left ear: Secondary | ICD-10-CM

## 2024-01-28 DIAGNOSIS — R52 Pain, unspecified: Secondary | ICD-10-CM

## 2024-01-28 DIAGNOSIS — R6883 Chills (without fever): Secondary | ICD-10-CM

## 2024-01-28 LAB — POCT RAPID COVID-19 & FLU (AMB ONLY)
COVID-19 AG: NEGATIVE
INFLUENZA TYPE A: NEGATIVE
INFLUENZA TYPE B: NEGATIVE

## 2024-01-28 MED ORDER — AMOXICILLIN 500 MG CAPSULE
500.0000 mg | ORAL_CAPSULE | Freq: Three times a day (TID) | ORAL | 0 refills | Status: AC
Start: 2024-01-28 — End: 2024-02-07

## 2024-01-28 NOTE — Nursing Note (Signed)
01/28/24 1700   Rapid Flu A/B and COVID   Time Performed 1747   Rapid Flu A Result Negative   Rapid Flu B Result Negative   Rapid COVID Result  Negative   Lot# [id number]   Expiration Date 08/07/25   Internal Control Valid yes   Initials HC

## 2024-01-28 NOTE — Progress Notes (Signed)
[address] [place] [address]  [address] [facility] [address]  [place] [address]    Progress Note    Name: Rim Thatch MRN:  [id number]   Date: 01/28/2024 DOB:  Oct 17, 1966 (58 y.o.)             Reason for Visit: Sore Throat, Generalized Body Aches, Headache, Cough, Chills, Fatigue, and Ear Pain (Started yesterday)    Nursing Notes:  Nursing Notes:   Graciela Husbands, [place]  01/28/24 1751  Signed    [employer]     I have reviewed, documented and confirmed point of care results.    Graciela Husbands, MA  01/28/2024, 17:46      Graciela Husbands, MA  01/28/24 1801  Signed     01/28/24 1700   Rapid Flu A/B and COVID   Time Performed 1747   Rapid Flu A Result Negative   Rapid Flu B Result Negative   Rapid COVID Result  Negative   Lot# [id number]   Expiration Date 08/07/25   Internal Control Valid yes   Initials HC         History of Present Illness  Khali Perella is a 58 y.o. female who is being seen today for above symptoms which began yesterday.   Patient reports sorethroat is mild and she declined rapid strep test.   Past Medical History:   Diagnosis Date    Anxiety     Arthritis     Back problem     DDD    Beta blocker prescribed for left ventricular systolic dysfunction     Blood thinned due to long-term anticoagulant use     Carotid stenosis     occlusion of left ICA    Chest pain 04/03/2018    Chronic back pain     Chronic obstructive pulmonary disease, unspecified COPD type (CMS HCC) 12/31/2022    Coronary artery disease involving native coronary artery of native heart     CVA (cerebral vascular accident) (CMS HCC)     DDD (degenerative disc disease), lumbar     "entire spine"    Ear piercing     GERD (gastroesophageal reflux disease)     controlled with omeprazole    H/O complete eye exam     2 years, Dr. Thompson Caul, at [facility]    History of dental examination     dentures upper and lower    HTN     Hx of coronary artery bypass graft     2012    Hx of gastric bypass     Hypercholesterolemia     Hyperlipidemia      Hypothyroid     MI (myocardial infarction) (CMS HCC) 2012    Neck problem     herniated cervical disc    Obesity (BMI 30-39.9)     Peripheral vascular disease, unspecified (CMS HCC) 01/03/2021    Solitary nodule of right lobe of thyroid 03/30/2018    Incidental finding on MRI thoracic spine. [place] ordered.     Stented coronary artery     x4    Stroke (CMS [facility])     2002 and again 2025    Stroke (CMS [facility]) 01/05/2024    treated at [facility] in [place], [place]    Tattoo     Left breast, Right shoulder    Thyroid disorder     Thyroid nodule     Type 2 diabetes mellitus (CMS HCC)     HGA1C 10.4 on 12/19/21  Uncontrolled type 2 diabetes mellitus, without long-term current use of insulin     Xanthelasma          Past Surgical History:   Procedure Laterality Date    ENDOMETRIAL ABLATION  1998    HX ANKLE FRACTURE TX  1990s    Right, [name] procedure    HX CATARACT REMOVAL Bilateral 2013    r and l eyes with implants    HX CESAREAN SECTION  06/20/2000    x3, 11/21/86, 11/10/84    HX CHOLECYSTECTOMY  1988    HX CORONARY ARTERY BYPASS GRAFT  03/31/2011    cardiac, 2 vessel, Dr. Francesco Runner, [facility]    HX CORONARY STENT PLACEMENT  2012    x4 stents    HX GASTRIC SLEEVE  02/2017    [place], [place], Dr. Devin Going HEART CATHETERIZATION  2012    [facility] stents - [facility]    HX THYROID BIOPSY      HX THYROIDECTOMY  101/01/2018    HX TONSILLECTOMY      as child    HX YAG Left 09/20/2013         Current Outpatient Medications   Medication Sig    amoxicillin (AMOXIL) 500 mg Oral Capsule Take 1 Capsule (500 mg total) by mouth Three times a day for 10 days    aspirin (ECOTRIN) 81 mg Oral Tablet, Delayed Release (E.C.) Take 1 Tablet (81 mg total) by mouth Once a day    atorvastatin (LIPITOR) 80 mg Oral Tablet Take 1 Tablet (80 mg total) by mouth    BD PRECISIONGLIDE 25 gauge x 1" Needle USE TO INJECT EVERY 30 DAYS    Blood Sugar Diagnostic (ACCU-CHEK GUIDE TEST STRIPS) Does not apply Strip TEST BLOOD SUGAR THREE  TIMES DAILY    Blood-Glucose Meter (ACCU-CHEK GUIDE GLUCOSE METER) Misc USE AS DIRECTED    clopidogreL (PLAVIX) 75 mg Oral Tablet TAKE 1 TABLET EVERY DAY    cyanocobalamin (VITAMIN B12) 1,000 mcg/mL Injection Solution INJECT 1 ML (1,000 MCG TOTAL) UNDER THE SKIN EVERY 30 DAYS    docusate sodium (COLACE) 100 mg Oral Capsule TAKE 1 CAPSULE TWICE DAILY    empagliflozin (JARDIANCE) 10 mg Oral Tablet Take 1 Tablet (10 mg total) by mouth Once a day    ergocalciferol, vitamin D2, (DRISDOL) 1,250 mcg (50,000 unit) Oral Capsule TAKE 1 CAPSULE BY MOUTH EVERY WEEK    ezetimibe (ZETIA) 10 mg Oral Tablet Take 1 Tablet (10 mg total) by mouth Every evening    fluticasone propionate (FLONASE) 50 mcg/actuation Nasal Spray, Suspension Administer 2 Sprays into each nostril Once a day    HYDROcodone-acetaminophen (NORCO) 10-325 mg Oral Tablet Take 1 Tablet by mouth Every 8 hours as needed for Pain Reuben Likes with [facility]    Ibuprofen (MOTRIN) 800 mg Oral Tablet TAKE ONE TABLET BY MOUTH THREE TIMES DAILY AS NEEDED FOR PAIN WITH FOOD    Lancets (ACCU-CHEK SOFTCLIX LANCETS) Misc USE AS DIRECTED DAILY    levothyroxine (SYNTHROID) 25 mcg Oral Tablet TAKE 1 TABLET ONE TIME DAILY    metoprolol succinate (TOPROL-XL) 25 mg Oral Tablet Sustained Release 24 hr Take 0.5 Tablets (12.5 mg total) by mouth Once a day    NARCAN 4 mg/actuation Nasal Spray, Non-Aerosol PT STATED SHE HAS NARCAN AT HOME BUT IS NOT TAKING IT    niacin (NIASPAN) 500 mg Oral Tablet Sustained Release 24 hr TAKE 1 TABLET EVERY DAY    Nicotine  21-14-7 mg/24 hr Transdermal Patch Daily, Sequential Place 1 Each on the skin Once a day Beginning with lowest dose (Patient not taking: Reported on 01/28/2024)    nitroGLYCERIN (NITROSTAT) 0.4 mg Sublingual Tablet, Sublingual Place 1 Tablet (0.4 mg total) under the tongue Every 5 minutes as needed for Chest pain for 3 doses over 15 minutes    omeprazole (PRILOSEC) 40 mg Oral Capsule, Delayed Release(E.C.) TAKE 1  CAPSULE EVERY DAY    ondansetron (ZOFRAN ODT) 8 mg Oral Tablet, Rapid Dissolve DISSOLVE 1 TABLET ON THE TONGUE EVERY 8 HOURS AS NEEDED FOR NAUSEA AND VOMITING    OZEMPIC 2 mg/dose (8 mg/3 mL) Subcutaneous Pen Injector Inject 2 mg under the skin Every 7 days    pioglitazone (ACTOS) 15 mg Oral Tablet Take 1 Tablet (15 mg total) by mouth Once a day    PRENATAL VITAMIN PLUS LOW IRON 27 mg iron- 1 mg Oral Tablet TAKE 1 TABLET EVERY DAY    venlafaxine (EFFEXOR XR) 37.5 mg Oral Capsule, Sust. Release 24 hr TAKE 1 CAPSULE ONE TIME DAILY    ZYRTEC-D 5-120 mg Oral Tablet Sustained Release 12 hr Take 1 Tablet by mouth Once a day     Allergies   Allergen Reactions    Capron Dm [Pyrilamine-Dextromethorphan]  Other Adverse Reaction (Add comment)     LIGHT HEADED    Other      Coban dressing caused skin break down    Tylenol W Codeine [Acetaminophen-Codeine]  Other Adverse Reaction (Add comment)     pt states that one time she had chest pains with this med, but dr. thought it was because she had not eaten     Family Medical History:       Problem Relation (Age of Onset)    Atrial fibrillation Sister    Colon Cancer Father    Diabetes Mother, Maternal Aunt    Heart Disease Sister, Maternal Grandfather    Hypertension (High Blood Pressure) Mother    Lung Cancer Maternal Grandfather    MI <61 years of age Mother            Social History     Tobacco Use    Smoking status: Every Day     Average packs/day: 0.5 packs/day for 44.0 years (21.8 ttl pk-yrs)     Types: Cigarettes     Start date: 20    Smokeless tobacco: Never   Vaping Use    Vaping status: Never Used   Substance Use Topics    Alcohol use: No    Drug use: No       Review of Systems  Review of Systems   Constitutional:  Positive for chills and malaise/fatigue.   HENT:  Positive for congestion, ear pain and sore throat.    Respiratory:  Positive for cough. Negative for sputum production, shortness of breath and wheezing.    Musculoskeletal:  Positive for myalgias.    Neurological:  Positive for headaches. Negative for sensory change, speech change and focal weakness.       Physical Exam:  BP 112/64 (Site: Right Antecubital, Patient Position: Sitting, Cuff Size: Adult)   Pulse 96   Temp 37.7 C (99.8 F) (Tympanic)   Resp 18   Ht 1.676 m (5\' 6" )   Wt 95.6 kg (210 lb 12.2 oz)   LMP  (LMP Unknown)   SpO2 97%   BMI 34.02 kg/m       Physical Exam  HENT:      Right  Ear: External ear normal. Tympanic membrane is erythematous. Tympanic membrane is not bulging.      Left Ear: External ear normal. Tympanic membrane is erythematous and bulging.      Nose: Congestion present.      Mouth/Throat:      Pharynx: Posterior oropharyngeal erythema present. No oropharyngeal exudate.   Cardiovascular:      Rate and Rhythm: Regular rhythm.   Pulmonary:      Effort: Pulmonary effort is normal.      Breath sounds: Normal breath sounds. No wheezing, rhonchi or rales.   Musculoskeletal:         General: No swelling or tenderness.   Lymphadenopathy:      Cervical: No cervical adenopathy.   Skin:     General: Skin is warm and dry.      Capillary Refill: Capillary refill takes less than 2 seconds.   Neurological:      Mental Status: She is alert.         Assessment:    Respiratory infection    Generalized body aches    Chills    Fever, unspecified fever cause    Non-recurrent acute suppurative otitis media of left ear without spontaneous rupture of tympanic membrane       Plan:  Rapid Influenza A&B and Covid-19 are negative.   I will treat with Amoxicillin for the OM and patient will monitor symptoms and notify clinic if not improving.  Patient can take OTC Tylenol as directed on product label for fever/body aches.   Patient Instructions   Follow up instructions:  Monitor for fever, chills or any other worsening symptoms.  Avoid respiratory irritants/allergens when possible.  Notify clinic if symptoms not improving next 2 days.  Go to the emergency department for worsening of symptoms.   Assure  you are getting adequate rest and stay well hydrated.       Orders Placed This Encounter    POCT Rapid Covid & Flu (AMB Only)    amoxicillin (AMOXIL) 500 mg Oral Capsule        MDM:      During the patient's visit at urgent care patient's HPI, ROS and physical exam were completed to assist in medical decision making.    Medications were reviewed and reconciled. Medication instructions and side effects were discussed.  Advised to seek immediate medical care with any new, worsening, or concerning symptoms.  Opportunity to ask questions was provided and all questions were answered.  Discussed diagnosis and management including indications for return, importance of close follow up and supportive care measures prior to patient discharge.       Ida Rogue, FNP-BC  01/28/2024, 17:38

## 2024-01-28 NOTE — Nursing Note (Signed)
[employer]     I have reviewed, documented and confirmed point of care results.    Graciela Husbands, [place]  01/28/2024, 17:46

## 2024-01-28 NOTE — Patient Instructions (Signed)
 Follow up instructions:  Monitor for fever, chills or any other worsening symptoms.  Avoid respiratory irritants/allergens when possible.  Notify clinic if symptoms not improving next 2 days.  Go to the emergency department for worsening of symptoms.   Assure you are getting adequate rest and stay well hydrated.

## 2024-01-29 ENCOUNTER — Ambulatory Visit (INDEPENDENT_AMBULATORY_CARE_PROVIDER_SITE_OTHER): Payer: Self-pay | Admitting: Family Medicine

## 2024-01-29 ENCOUNTER — Encounter (INDEPENDENT_AMBULATORY_CARE_PROVIDER_SITE_OTHER): Payer: Self-pay

## 2024-01-29 DIAGNOSIS — S62667A Nondisplaced fracture of distal phalanx of left little finger, initial encounter for closed fracture: Secondary | ICD-10-CM

## 2024-01-29 NOTE — Result Encounter Note (Signed)
 Please inform the patient that the carotid ultrasound showed less than 50% stenosis on the right side and occlusion on the left that it was previously known.      The radiologist recommended repeating this test in 1 year.      Will review this together at our follow-up appointment.      TY TH

## 2024-02-01 ENCOUNTER — Ambulatory Visit: Payer: Commercial Managed Care - PPO | Attending: FAMILY MEDICINE | Admitting: FAMILY MEDICINE

## 2024-02-01 ENCOUNTER — Encounter (INDEPENDENT_AMBULATORY_CARE_PROVIDER_SITE_OTHER): Payer: Self-pay

## 2024-02-01 ENCOUNTER — Other Ambulatory Visit: Payer: Self-pay

## 2024-02-01 VITALS — BP 128/70 | HR 76 | Temp 97.6°F | Resp 18 | Ht 66.0 in | Wt 209.2 lb

## 2024-02-01 DIAGNOSIS — R059 Cough, unspecified: Secondary | ICD-10-CM | POA: Insufficient documentation

## 2024-02-01 DIAGNOSIS — J111 Influenza due to unidentified influenza virus with other respiratory manifestations: Secondary | ICD-10-CM | POA: Insufficient documentation

## 2024-02-01 LAB — POCT RAPID FLU
INFLUENZA TYPE A: POSITIVE
INFLUENZA TYPE B: NEGATIVE

## 2024-02-01 LAB — POCT RAPID SARS-COV-2 ANTIGEN (AMB ONLY): COVID-19 AG: NEGATIVE

## 2024-02-01 NOTE — Nursing Note (Signed)
 Covid test  negative. Lot# [id number] AY  exp 05/15/25. JI/LPN

## 2024-02-04 NOTE — Progress Notes (Signed)
 [facility], [facility]  [address]  [place] [address]  Operated by [facility]  Progress Note    Name: Monique [name] MRN:  [id number]   Date: 02/01/2024 DOB:  1966/05/30 (58 y.o.)              ASSESSMENT AND PLAN:    1. Influenza (Primary)  - 6 days of congestion not improved on amoxicillin, she has scratchy throat and cough  -exam   - exam suggestive of nonspecific URI  - POCT RAPID FLU positive  - POCT Rapid Covid-19 Antigen (AMB ONLY) negative  - Supportive care as patient is outside window for Tamiflu    FOLLOW UP:  No follow-ups on file.    CHIEF COMPLAINT:   Chief Complaint   Patient presents with    Congestion     Onset 6 days. On Amoxicillin currently, "It's getting worse, not better,".  Complaints of congestion in head and ears, "nothing in my chest,". "My throat is scratchy, I have a cough,".          HPI:  Monique [name] is a 58 y.o. female who presents for 6 days of congestion not improved on amoxicillin, she has scratchy throat and cough.    MEDS:  amoxicillin (AMOXIL) 500 mg Oral Capsule, Take 1 Capsule (500 mg total) by mouth Three times a day for 10 days  aspirin (ECOTRIN) 81 mg Oral Tablet, Delayed Release (E.C.), Take 1 Tablet (81 mg total) by mouth Once a day  atorvastatin (LIPITOR) 80 mg Oral Tablet, Take 1 Tablet (80 mg total) by mouth  BD PRECISIONGLIDE 25 gauge x 1" Needle, USE TO INJECT EVERY 30 DAYS  Blood Sugar Diagnostic (ACCU-CHEK GUIDE TEST STRIPS) Does not apply Strip, TEST BLOOD SUGAR THREE TIMES DAILY  Blood-Glucose Meter (ACCU-CHEK GUIDE GLUCOSE METER) Misc, USE AS DIRECTED  clopidogreL (PLAVIX) 75 mg Oral Tablet, TAKE 1 TABLET EVERY DAY  cyanocobalamin (VITAMIN B12) 1,000 mcg/mL Injection Solution, INJECT 1 ML (1,000 MCG TOTAL) UNDER THE SKIN EVERY 30 DAYS  docusate sodium (COLACE) 100 mg Oral Capsule, TAKE 1 CAPSULE TWICE DAILY  empagliflozin (JARDIANCE) 10 mg Oral Tablet, Take 1 Tablet (10 mg total) by mouth Once a day  ergocalciferol, vitamin  D2, (DRISDOL) 1,250 mcg (50,000 unit) Oral Capsule, TAKE 1 CAPSULE BY MOUTH EVERY WEEK  ezetimibe (ZETIA) 10 mg Oral Tablet, Take 1 Tablet (10 mg total) by mouth Every evening  fluticasone propionate (FLONASE) 50 mcg/actuation Nasal Spray, Suspension, Administer 2 Sprays into each nostril Once a day  HYDROcodone-acetaminophen (NORCO) 10-325 mg Oral Tablet, Take 1 Tablet by mouth Every 8 hours as needed for Pain Reuben Likes with [facility]  Ibuprofen (MOTRIN) 800 mg Oral Tablet, TAKE ONE TABLET BY MOUTH THREE TIMES DAILY AS NEEDED FOR PAIN WITH FOOD  Lancets (ACCU-CHEK SOFTCLIX LANCETS) Misc, USE AS DIRECTED DAILY  levothyroxine (SYNTHROID) 25 mcg Oral Tablet, TAKE 1 TABLET ONE TIME DAILY  metoprolol succinate (TOPROL-XL) 25 mg Oral Tablet Sustained Release 24 hr, Take 0.5 Tablets (12.5 mg total) by mouth Once a day  NARCAN 4 mg/actuation Nasal Spray, Non-Aerosol, PT STATED SHE HAS NARCAN AT HOME BUT IS NOT TAKING IT  niacin (NIASPAN) 500 mg Oral Tablet Sustained Release 24 hr, TAKE 1 TABLET EVERY DAY  Nicotine 21-14-7 mg/24 hr Transdermal Patch Daily, Sequential, Place 1 Each on the skin Once a day Beginning with lowest dose  nitroGLYCERIN (NITROSTAT) 0.4 mg Sublingual Tablet, Sublingual, Place 1 Tablet (0.4 mg total) under  the tongue Every 5 minutes as needed for Chest pain for 3 doses over 15 minutes  omeprazole (PRILOSEC) 40 mg Oral Capsule, Delayed Release(E.C.), TAKE 1 CAPSULE EVERY DAY  ondansetron (ZOFRAN ODT) 8 mg Oral Tablet, Rapid Dissolve, DISSOLVE 1 TABLET ON THE TONGUE EVERY 8 HOURS AS NEEDED FOR NAUSEA AND VOMITING  OZEMPIC 2 mg/dose (8 mg/3 mL) Subcutaneous Pen Injector, Inject 2 mg under the skin Every 7 days  pioglitazone (ACTOS) 15 mg Oral Tablet, Take 1 Tablet (15 mg total) by mouth Once a day  PRENATAL VITAMIN PLUS LOW IRON 27 mg iron- 1 mg Oral Tablet, TAKE 1 TABLET EVERY DAY  venlafaxine (EFFEXOR XR) 37.5 mg Oral Capsule, Sust. Release 24 hr, TAKE 1 CAPSULE ONE TIME  DAILY  ZYRTEC-D 5-120 mg Oral Tablet Sustained Release 12 hr, Take 1 Tablet by mouth Once a day    No facility-administered medications prior to visit.    ALLERGIES:  Allergies   Allergen Reactions    Capron Dm [Pyrilamine-Dextromethorphan]  Other Adverse Reaction (Add comment)     LIGHT HEADED    Other      Coban dressing caused skin break down    Tylenol W Codeine [Acetaminophen-Codeine]  Other Adverse Reaction (Add comment)     pt states that one time she had chest pains with this med, but dr. thought it was because she had not eaten        ROS negative except noted in HPI    Vitals:    02/01/24 1557   BP: 128/70   Pulse: 76   Resp: 18   Temp: 36.4 C (97.6 F)   TempSrc: Tympanic   SpO2: 97%   Weight: 94.9 kg (209 lb 3.2 oz)   Height: 1.676 m (5\' 6" )   BMI: 33.77       Physical Exam  Vitals reviewed.   Constitutional:       General: She is not in acute distress.     Appearance: Normal appearance. She is ill-appearing. She is not toxic-appearing.   HENT:      Head: Normocephalic and atraumatic.      Mouth/Throat:      Mouth: Mucous membranes are moist.   Eyes:      Extraocular Movements: Extraocular movements intact.      Pupils: Pupils are equal, round, and reactive to light.   Cardiovascular:      Rate and Rhythm: Normal rate and regular rhythm.      Heart sounds: Normal heart sounds.   Pulmonary:      Effort: Pulmonary effort is normal.      Breath sounds: Normal breath sounds.   Abdominal:      Palpations: Abdomen is soft.      Tenderness: There is no abdominal tenderness.   Skin:     General: Skin is warm and dry.   Neurological:      Mental Status: She is alert and oriented to person, place, and time.   Psychiatric:         Mood and Affect: Mood normal.         Behavior: Behavior normal.          Nursing Notes:   Delora Fuel, LPN  78/29/56 2130  Signed  Covid test  negative. Lot# [id number]  exp 05/15/25. JI/LPN     Atlee Abide, DO

## 2024-02-08 ENCOUNTER — Encounter: Payer: Self-pay | Admitting: Internal Medicine

## 2024-02-11 ENCOUNTER — Encounter (INDEPENDENT_AMBULATORY_CARE_PROVIDER_SITE_OTHER): Payer: Self-pay | Admitting: Neurology

## 2024-02-11 ENCOUNTER — Other Ambulatory Visit: Payer: Self-pay

## 2024-02-11 ENCOUNTER — Ambulatory Visit (INDEPENDENT_AMBULATORY_CARE_PROVIDER_SITE_OTHER): Payer: Self-pay | Admitting: Neurology

## 2024-02-11 VITALS — BP 110/68 | Ht 66.0 in | Wt 209.2 lb

## 2024-02-11 DIAGNOSIS — I63232 Cerebral infarction due to unspecified occlusion or stenosis of left carotid arteries: Secondary | ICD-10-CM

## 2024-02-11 DIAGNOSIS — I6522 Occlusion and stenosis of left carotid artery: Secondary | ICD-10-CM

## 2024-02-11 DIAGNOSIS — Z720 Tobacco use: Secondary | ICD-10-CM

## 2024-02-11 DIAGNOSIS — E1165 Type 2 diabetes mellitus with hyperglycemia: Secondary | ICD-10-CM

## 2024-02-11 DIAGNOSIS — I10 Essential (primary) hypertension: Secondary | ICD-10-CM

## 2024-02-11 DIAGNOSIS — G8194 Hemiplegia, unspecified affecting left nondominant side: Secondary | ICD-10-CM

## 2024-02-11 DIAGNOSIS — M4696 Unspecified inflammatory spondylopathy, lumbar region: Secondary | ICD-10-CM

## 2024-02-11 DIAGNOSIS — I639 Cerebral infarction, unspecified: Secondary | ICD-10-CM

## 2024-02-11 DIAGNOSIS — I672 Cerebral atherosclerosis: Secondary | ICD-10-CM

## 2024-02-11 DIAGNOSIS — Z7985 Long-term (current) use of injectable non-insulin antidiabetic drugs: Secondary | ICD-10-CM

## 2024-02-11 NOTE — H&P (Signed)
 [name]  [phone]  [name] [phone]       NEUROLOGY CLINIC NOTE     Date of service:  02/11/2024   Monique [name] [name] y.o. female   Date of birth:  13-Sep-1966   Chief complaint:  New Patient  History of present illness:  Monique [name] is a 58 y.o. female who presented to neurology clinic for further evaluation of stroke. Patient is a pleasant 58 year old female with a history of diabetes mellitus, coronary artery disease status post CABG, hyperlipidemia, chronic smoking, chronic obstructive pulmonary disease.  She developed left-sided body weakness and dysarthria in mid January.  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  Eventually she was transferred to [name] where she underwent conventional cerebral angiogram which demonstrated severe intracranial atherosclerotic disease.  There is no role for intervention and she was recommended to continue maximal medical therapy and lifestyle modifications.     Present history:  Patient is doing reasonably well without any significant issues.  She has a occasional left upper and lower extremity weakness especially after exertion.  She is presently taking aspirin, Plavix, and high-dose atorvastatin.  She is working on quitting smoking.    Review of systems:  Review of systems reviewed and negative except as noted in HPI.  Past medical history:  Past Medical History:   Diagnosis Date    Anxiety     Arthritis     Back problem     DDD    Beta blocker prescribed for left ventricular systolic dysfunction     Blood thinned due to long-term anticoagulant use     Carotid stenosis     occlusion of left ICA    Chest pain 04/03/2018    Chronic back pain     Chronic obstructive pulmonary disease, unspecified COPD type (CMS HCC) 12/31/2022    Coronary artery disease involving native coronary artery of native heart     CVA (cerebral vascular accident) (CMS HCC)     DDD  (degenerative disc disease), lumbar     "entire spine"    Ear piercing     GERD (gastroesophageal reflux disease)     controlled with omeprazole    H/O complete eye exam     2 years, Dr. Thompson Caul, at [name]    History of dental examination     dentures upper and lower    HTN     Hx of coronary artery bypass graft     2012    Hx of gastric bypass     Hypercholesterolemia     Hyperlipidemia     Hypothyroid     MI (myocardial infarction) (CMS HCC) 2012    Neck problem     herniated cervical disc    Obesity (BMI 30-39.9)     Peripheral vascular disease, unspecified (CMS HCC) 01/03/2021    Solitary nodule of right lobe of thyroid 03/30/2018    Incidental finding on MRI thoracic spine. [place] ordered.     Stented coronary artery     x4    Stroke (CMS [name])     2002 and again 2025    Stroke (CMS [name]) 01/05/2024    treated at [name] in [place], [place]    Tattoo     Left breast, Right shoulder    Thyroid disorder     Thyroid nodule     Type 2 diabetes mellitus (CMS HCC)     HGA1C 10.4 on  12/19/21    Uncontrolled type 2 diabetes mellitus, without long-term current use of insulin     Xanthelasma       Past surgical history:  Past Surgical History:   Procedure Laterality Date    ENDOMETRIAL ABLATION  1998    HX ANKLE FRACTURE TX  1990s    Right, [name] procedure    HX CATARACT REMOVAL Bilateral 2013    r and l eyes with implants    HX CESAREAN SECTION  06/20/2000    x3, 11/21/86, 11/10/84    HX CHOLECYSTECTOMY  1988    HX CORONARY ARTERY BYPASS GRAFT  03/31/2011    cardiac, 2 vessel, Dr. Francesco Runner, [name]    HX CORONARY STENT PLACEMENT  2012    x4 stents    HX GASTRIC SLEEVE  02/2017    [place], [place], Dr. Devin Going HEART CATHETERIZATION  2012    massive heart attack and stents - [facility]    HX THYROID BIOPSY      HX THYROIDECTOMY  101/01/2018    HX TONSILLECTOMY      as child    HX YAG Left 09/20/2013      Family history:  Family Medical History:       Problem Relation (Age of Onset)    Atrial fibrillation Sister     Colon Cancer Father    Diabetes Mother, Maternal Aunt    Heart Disease Sister, Maternal Grandfather    Hypertension (High Blood Pressure) Mother    Lung Cancer Maternal Grandfather    MI <39 years of age Mother           Social history:   reports that she has been smoking cigarettes. She started smoking about 44 years ago. She has a 21.8 pack-year smoking history. She has never used smokeless tobacco. She reports that she does not drink alcohol and does not use drugs.   Allergies   Allergen Reactions    Capron Dm [Pyrilamine-Dextromethorphan]  Other Adverse Reaction (Add comment)     LIGHT HEADED    Other      Coban dressing caused skin break down    Tylenol W Codeine [Acetaminophen-Codeine]  Other Adverse Reaction (Add comment)     pt states that one time she had chest pains with this med, but dr. thought it was because she had not eaten      aspirin (ECOTRIN) 81 mg Oral Tablet, Delayed Release (E.C.), Take 1 Tablet (81 mg total) by mouth Once a day  atorvastatin (LIPITOR) 80 mg Oral Tablet, Take 1 Tablet (80 mg total) by mouth  BD PRECISIONGLIDE 25 gauge x 1" Needle, USE TO INJECT EVERY 30 DAYS  Blood Sugar Diagnostic (ACCU-CHEK GUIDE TEST STRIPS) Does not apply Strip, TEST BLOOD SUGAR THREE TIMES DAILY  Blood-Glucose Meter (ACCU-CHEK GUIDE GLUCOSE METER) Misc, USE AS DIRECTED  clopidogreL (PLAVIX) 75 mg Oral Tablet, TAKE 1 TABLET EVERY DAY  cyanocobalamin (VITAMIN B12) 1,000 mcg/mL Injection Solution, INJECT 1 ML (1,000 MCG TOTAL) UNDER THE SKIN EVERY 30 DAYS  docusate sodium (COLACE) 100 mg Oral Capsule, TAKE 1 CAPSULE TWICE DAILY  empagliflozin (JARDIANCE) 10 mg Oral Tablet, Take 1 Tablet (10 mg total) by mouth Once a day  ergocalciferol, vitamin D2, (DRISDOL) 1,250 mcg (50,000 unit) Oral Capsule, TAKE 1 CAPSULE BY MOUTH EVERY WEEK  ezetimibe (ZETIA) 10 mg Oral Tablet, Take 1 Tablet (10 mg total) by mouth Every evening  fluticasone propionate (FLONASE) 50 mcg/actuation Nasal Spray, Suspension, Administer 2  Sprays into each nostril Once a day  HYDROcodone-acetaminophen (NORCO) 10-325 mg Oral Tablet, Take 1 Tablet by mouth Every 8 hours as needed for Pain Reuben Likes with [name]  Ibuprofen (MOTRIN) 800 mg Oral Tablet, TAKE ONE TABLET BY MOUTH THREE TIMES DAILY AS NEEDED FOR PAIN WITH FOOD  Lancets (ACCU-CHEK SOFTCLIX LANCETS) Misc, USE AS DIRECTED DAILY  levothyroxine (SYNTHROID) 25 mcg Oral Tablet, TAKE 1 TABLET ONE TIME DAILY  metoprolol succinate (TOPROL-XL) 25 mg Oral Tablet Sustained Release 24 hr, Take 0.5 Tablets (12.5 mg total) by mouth Once a day  NARCAN 4 mg/actuation Nasal Spray, Non-Aerosol, PT STATED SHE HAS NARCAN AT HOME BUT IS NOT TAKING IT  niacin (NIASPAN) 500 mg Oral Tablet Sustained Release 24 hr, TAKE 1 TABLET EVERY DAY  Nicotine 21-14-7 mg/24 hr Transdermal Patch Daily, Sequential, Place 1 Each on the skin Once a day Beginning with lowest dose  nitroGLYCERIN (NITROSTAT) 0.4 mg Sublingual Tablet, Sublingual, Place 1 Tablet (0.4 mg total) under the tongue Every 5 minutes as needed for Chest pain for 3 doses over 15 minutes  omeprazole (PRILOSEC) 40 mg Oral Capsule, Delayed Release(E.C.), TAKE 1 CAPSULE EVERY DAY  ondansetron (ZOFRAN ODT) 8 mg Oral Tablet, Rapid Dissolve, DISSOLVE 1 TABLET ON THE TONGUE EVERY 8 HOURS AS NEEDED FOR NAUSEA AND VOMITING  OZEMPIC 2 mg/dose (8 mg/3 mL) Subcutaneous Pen Injector, Inject 2 mg under the skin Every 7 days  pioglitazone (ACTOS) 15 mg Oral Tablet, Take 1 Tablet (15 mg total) by mouth Once a day  PRENATAL VITAMIN PLUS LOW IRON 27 mg iron- 1 mg Oral Tablet, TAKE 1 TABLET EVERY DAY  venlafaxine (EFFEXOR XR) 37.5 mg Oral Capsule, Sust. Release 24 hr, TAKE 1 CAPSULE ONE TIME DAILY  ZYRTEC-D 5-120 mg Oral Tablet Sustained Release 12 hr, Take 1 Tablet by mouth Once a day    No facility-administered medications prior to visit.    Physical examination:  Vitals:    02/11/24 0851   BP: 110/68   Weight: 94.9 kg (209 lb 3.5 oz)   Height: 1.676 m (5'  6")   BMI: 33.77            Diagnostic tests, imaging, and labs reviewed:    Conventional cerebral angiogram: Slow 12/27/2023:  Diagnostic angiography demonstrated extensive intracranial atherosclerotic disease.  The left internal carotid artery is occluded at its origin.  Flow to the left middle cerebral artery is through the posterior circulation antiplatelet patent posterior communicating artery.  However, both anterior cerebral arteries appear occluded and reconstituted through the leptomeningeal collaterals of both posterior cerebral arteries.  Furthermore, left anterior cerebral artery is reconstituted via collaterals of left external carotid artery.  A near occlusion of the right internal carotid artery at supraclinoid segment is seen.  This is in tandem with a diffuse atherosclerotic disease within the right anterior circulation at described above.  Flow to the right middle cerebral artery is supplied through the leptomeningeal collaterals of the right posterior cerebral artery.    MRI brain: Few punctate and curvilinear foci of diffusion restriction within the superior right frontoparietal region.Areas of old infarction throughout the cerebral hemispheres. Mild changes of chronic small-vessel disease.      Hba1c-8.7  Lipid panel:   Total cholesterol 239   Triglycerides 153   HDL 45   LDL 163    Assessment and plan:    ICD-10-CM    1. ICAO (internal carotid artery occlusion), left  I65.22       2.  Cerebrovascular accident (CVA), unspecified mechanism (CMS HCC)  I63.9       3. Type 2 diabetes mellitus with hyperglycemia, without long-term current use of insulin (CMS HCC)  E11.65       4. Unspecified inflammatory spondylopathy, lumbar region (CMS HCC)  M46.96       5. Intracranial atherosclerosis  I67.2       Milderd Manocchio is a pleasant 58 year old female with a history of hypertension, diabetes mellitus, CAD status post CABG, chronic smoking, chronic left ICA occlusion, right ICA cavernous segment  stenosis, multiple intracranial vascular stenosis and significant atherosclerosis.  She recently presented to outside facility with new onset slurring of speech and transferred to [facility] and underwent conventional cerebral angiogram which demonstrated significant intracranial atherosclerotic disease.  She was noted to have right hemispheric frontal lobe infarcts.    Plan:   1. Continue aspirin 81 mg daily and Plavix 75 mg daily.    2. Continue atorvastatin 80 mg daily.    3. Counseled about acute stroke symptoms (BEFAST) and suggested him to seek urgent care if they develop such symptoms..    4. I suggested her to get in touch with neuro intervention [facility] in 6 months.  If they need any local testing we would be happy to facilitate CT angiogram of the head locally.    5. Counseled about healthy diet and exercise.    6. Counseled about smoking cessation.  7.  I suggested her to call us with any further questions or concerns.    8. Follow up in 12 months.    Verita Schneiders, MD   This note was partially generated using MModal Fluency Direct system, and there may be some incorrect words, spellings, and punctuation.

## 2024-03-01 ENCOUNTER — Telehealth (INDEPENDENT_AMBULATORY_CARE_PROVIDER_SITE_OTHER): Payer: Self-pay | Admitting: Family Medicine

## 2024-03-01 DIAGNOSIS — I10 Essential (primary) hypertension: Secondary | ICD-10-CM

## 2024-03-01 DIAGNOSIS — I2510 Atherosclerotic heart disease of native coronary artery without angina pectoris: Secondary | ICD-10-CM

## 2024-03-01 DIAGNOSIS — E7800 Pure hypercholesterolemia, unspecified: Secondary | ICD-10-CM

## 2024-03-01 DIAGNOSIS — J449 Chronic obstructive pulmonary disease, unspecified: Secondary | ICD-10-CM

## 2024-03-01 DIAGNOSIS — K219 Gastro-esophageal reflux disease without esophagitis: Secondary | ICD-10-CM

## 2024-03-01 DIAGNOSIS — E1165 Type 2 diabetes mellitus with hyperglycemia: Secondary | ICD-10-CM

## 2024-03-01 NOTE — Telephone Encounter (Signed)
 Pt was in the office with mother and requested to have lab work done and Dr Nolberto Batty stated that it was okay to order.  Orders placed for fasting labs.   Comments added by RLM, CMA  on 03/01/24 at 14:45.

## 2024-03-02 ENCOUNTER — Other Ambulatory Visit: Payer: Self-pay | Admitting: *Deleted

## 2024-03-02 ENCOUNTER — Ambulatory Visit (INDEPENDENT_AMBULATORY_CARE_PROVIDER_SITE_OTHER): Payer: Self-pay | Admitting: Internal Medicine

## 2024-03-02 ENCOUNTER — Encounter: Payer: Self-pay | Admitting: Internal Medicine

## 2024-03-02 VITALS — BP 118/77 | HR 67 | Temp 98.2°F | Ht 66.0 in | Wt 143.7 lb

## 2024-03-02 DIAGNOSIS — K429 Umbilical hernia without obstruction or gangrene: Secondary | ICD-10-CM

## 2024-03-02 DIAGNOSIS — K219 Gastro-esophageal reflux disease without esophagitis: Secondary | ICD-10-CM

## 2024-03-02 DIAGNOSIS — K828 Other specified diseases of gallbladder: Secondary | ICD-10-CM

## 2024-03-02 DIAGNOSIS — K9089 Other intestinal malabsorption: Secondary | ICD-10-CM

## 2024-03-02 DIAGNOSIS — K760 Fatty (change of) liver, not elsewhere classified: Secondary | ICD-10-CM

## 2024-03-02 DIAGNOSIS — G8929 Other chronic pain: Secondary | ICD-10-CM

## 2024-03-02 DIAGNOSIS — K30 Functional dyspepsia: Secondary | ICD-10-CM

## 2024-03-02 DIAGNOSIS — K529 Noninfective gastroenteritis and colitis, unspecified: Secondary | ICD-10-CM | POA: Diagnosis not present

## 2024-03-02 DIAGNOSIS — R109 Unspecified abdominal pain: Secondary | ICD-10-CM | POA: Diagnosis not present

## 2024-03-02 MED ORDER — RABEPRAZOLE SODIUM 20 MG PO TBEC: 20.0000 mg | DELAYED_RELEASE_TABLET | Freq: Every day | ORAL | 11 refills | Status: AC

## 2024-03-02 MED ORDER — COLESTIPOL HCL 1 G PO TABS: 2.0000 g | ORAL_TABLET | Freq: Two times a day (BID) | ORAL | 11 refills | Status: AC

## 2024-03-02 NOTE — Progress Notes (Signed)
 Referring Provider: Richardean Chimera, MD Primary Care Physician:  Richardean Chimera, MD Primary GI:  Dr. Marletta Lor  Chief Complaint  Patient presents with   Gastroesophageal Reflux    Follow up on GERD. Ran out of aciphex and been out for awhile but med does help when needed.    fatty liver    Follow up on fatty liver. States she saw liver specialist and wanted to discuss the visit.     HPI:   Ann [name] is a 58 y.o. female who presents to the clinic today for follow up visit.   She has a history of  chronic abdominal pain, chronic intermittent diarrhea, prior metal biliary stent placed in 2012 due to strictured CBD, multiple previous ERCPs outside facility after bile leak s/p cholecystectomy, last ERCP in July 2021 at [facility] with biliary stent pull/balloon sweep with stone removal, dilation. Has seen multiple GI practices. See below for prior evaluations.    Starting in 2010 after cholecystectomy: She had a bile leak s/p cholecystectomy, requiring ERCP with stent placement for biliary stricture. She reports multiple ERCPs at outside facility with multiple stents placed. She notes that each time stent would be removed, she would develop abdominal pain. She reports having "8-10 ERCPs". Liver biopsy by Dr. Milinda Antis 2011: non-specific findings. Looking back at LFTs, she has had bumps in transaminases from the upper 200s to 400/600, with bilirubin ranging from 1-3.  2013 had fluctuating transaminases as well but less severe. During hospitalization in [facility] Dec 2015 for acute pancreatitis, peak AST 243, ALT 206, bilirubin normal at 1. A,lk phos 342, improving with supportive care. 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     ERCP in 2012 by Dr. Opal Sidles with moderate stricture of the distal CBD with moderate dilatation of CBD, common hepatic duct, and intrahepatic biliary tree. In  Aug 2012, a fully covered metal wall stent (10X 40 mm) was placed. Brushings around 2012 negative for malignancy. When seen by Dr. Lanell Matar at [facility] in 2013, question of secondary sclerosing cholangitis. EGD/EUS March 2013 normal. Recommended stent removal at that time, but she declined due to fear that pain would recur. She was inpatient in [place] and was assessed by Dr. Dulce Sellar in 2015 with abdominal pain and abnormal CT scan noting thickening of the head of pancreas with non-pathologic sized peripancreatic adenopathy. Felt she may have acute on chronic pancreatitis at that time. She was to follow-up with Dr. Dulce Sellar Jan 2016 but did not do this. CA 19-9 low in 2015   [facility] July 2021. ERCP with biliary stent pull/balloon sweep with stones remove, dilated with 12 mm balloon.    Due to persistent abdominal pain, Feb 2024  CTA negative for chronic mesenteric ischemia.    Celiac serologies negative.    EGD in Dec 2023: gastritis s/p biopsy, normal duodenum.    Declining TCAs. Stating this has caused sedation in the past.    MRI/MRCP 07/25/2023, status post cholecystectomy, postoperative pneumobilia without CBD dilation, mild segmental biliary ductal dilatation anterior right lobe of the liver, unchanged.  Hepatic steatosis   RUQ [place] with elastography 08/26/23 hepatic steatosis, median K PA 2.3.   Today:   Abdominal pain: Primarily epigastric, worse after meals. Occurs every other day on average, moderate to severe, will last approx. 1 hour. Feels similar to pain she experienced with pancreatitis prior. Has lost weight as she has  pain after meals. Taking Aciphex. Previously trialed on Bentyl which she states did not help. Previously on pancreatic enzymes which helped some but stopped after a few months. TCAs have caused sedation.    On previous visit, I started her on BuSpar 5 mg 3 times daily. She states she's taking this 2x a day primarily.  She states today that her symptoms are 95%  improved.  She is quite happy and excited in regards to these results.    Does complain about pain and swelling in her umbilicus area, MRI in August did show small umbilical hernia fat-containing, not on initial report though was found on reread through [place].   Diarrhea: Occurs every day 1-3 times a day. Will occasionally have formed stools.  Previously taking Imodium every morning.  I started her on cholestyramine daily and she states that her diarrhea is also improved.  She states that is difficult to tolerate the powder, started her on colestipol 1 g twice daily which she states is not strong enough, having to take powder on top of this at times.   Hepatic steatosis, biliary dilatation: Referred to transplant hepatology.  Felt to not be related to [facility].  Further serological workup unremarkable.  Past Medical History:  Diagnosis Date   Anxiety    Arthritis    Chronic back pain    Complication of anesthesia    pt states, "i have migraine and high BP after surgery.".   Depression    GERD (gastroesophageal reflux disease)    Migraines    Pancreatitis     Past Surgical History:  Procedure Laterality Date   BILE DUCT STENT PLACEMENT  07/2011   Dr. Steffanie Dunn: fully covered metal wall stent placed.    BIOPSY  12/05/2022   Procedure: BIOPSY;  Surgeon: Lanelle Bal, DO;  [facility];  Service: Endoscopy;;   CHOLECYSTECTOMY  2010   with bile leak   COLONOSCOPY  2011   [facility]   COLONOSCOPY WITH PROPOFOL N/A 03/02/2018   two 2-3 mm hyperplastic polyps, torturous left colon, mild diverticulosis in rectosigmoid, internal hemorrhoids, colonic biopsies negative   ERCP  2011-2013   multiple, with multiple stent placements/exchanges. Metal wall stent placed in Aug 2012 AND STILL PRESENT AS OF 2019   ESOPHAGOGASTRODUODENOSCOPY (EGD) WITH PROPOFOL N/A 03/02/2018   dysphagia due to benign-appearing esophageal stricture/GERD, s/p dilation, small hiatal hernia,  gastritis.    ESOPHAGOGASTRODUODENOSCOPY (EGD) WITH PROPOFOL N/A 12/05/2022   gastritis, overall unrevealing.   SAVORY DILATION N/A 03/02/2018   Procedure: SAVORY DILATION;  Surgeon: West Bali, MD;  [facility];  Service: Endoscopy;  Laterality: N/A;    Current Outpatient Medications  Medication Sig Dispense Refill   ALPRAZolam (XANAX) 1 MG tablet Take 0.5-1 mg by mouth 2 (two) times daily as needed for anxiety or sleep.  0   amphetamine-dextroamphetamine (ADDERALL) 20 MG tablet Take 20 mg by mouth every morning. As needed     busPIRone (BUSPAR) 10 MG tablet Take 1 tablet (10 mg total) by mouth 3 (three) times daily. 90 tablet 11   colestipol (COLESTID) 1 g tablet Take 1 tablet (1 g total) by mouth 2 (two) times daily. 60 tablet 11   diclofenac (VOLTAREN) 75 MG EC tablet Take 75 mg by mouth daily.     ondansetron (ZOFRAN-ODT) 8 MG disintegrating tablet Take 8 mg by mouth every 8 (eight) hours as needed for nausea or vomiting.     Rimegepant Sulfate (NURTEC) 75 MG TBDP Take  by mouth daily at 6 (six) AM.     No current facility-administered medications for this visit.    Allergies as of 03/02/2024 - Review Complete 03/02/2024  Allergen Reaction Noted   Imitrex [sumatriptan] Anaphylaxis and Rash 06/15/2012   Cymbalta [duloxetine hcl] Diarrhea, Nausea Only, and Other (See Comments) 02/12/2016   Dicyclomine  03/02/2018   Doxycycline Diarrhea 06/15/2012   Gabapentin Other (See Comments) 02/17/2018   Lyrica [pregabalin] Swelling and Other (See Comments) 06/15/2012   Nucynta [tapentadol]  05/20/2015   Nuvigil [armodafinil] Other (See Comments) 02/12/2016   Other  06/06/2015   Penicillins Other (See Comments) 02/12/2016   Tape  07/24/2019   Amoxicillin-pot clavulanate Diarrhea and Rash 06/15/2012   Butrans [buprenorphine] Hives, Swelling, and Rash 06/15/2012   Ciprofloxacin Diarrhea, Nausea And Vomiting, and Other (See Comments) 02/12/2016    Family History  Problem  Relation Age of Onset   Cervical cancer Mother    Heart attack Other    Migraines Neg Hx    Seizures Neg Hx    Dementia Neg Hx    Colon cancer Neg Hx     Social History   Socioeconomic History   Marital status: Married    Spouse name: Onalee Hua   Number of children: 2   Years of education: 11   Highest education level: Not on file  Occupational History   Occupation: Workers comp  Tobacco Use   Smoking status: Former    Current packs/day: 0.00    Average packs/day: 0.3 packs/day for 10.0 years (2.5 ttl pk-yrs)    Types: Cigarettes    Start date: 12/08/2001    Quit date: 12/09/2011    Years since quitting: 12.2   Smokeless tobacco: Never  Vaping Use   Vaping status: Never Used  Substance and Sexual Activity   Alcohol use: No   Drug use: No   Sexual activity: Yes    Birth control/protection: Post-menopausal  Other Topics Concern   Not on file  Social History Narrative   Lives w/ husband   Caffeine use:  none   Social Drivers of Corporate investment banker Strain: Not on file  Food Insecurity: Low Risk  (02/04/2024)   Received from [facility]   Hunger Vital Sign    Worried About Running Out of Food in the Last Year: Never true    Ran Out of Food in the Last Year: Never true  Transportation Needs: No Transportation Needs (02/04/2024)   Received from [facility]    In the past 12 months, has lack of reliable transportation kept you from medical appointments, meetings, work or from getting things needed for daily living? : No  Physical Activity: Not on file  Stress: Not on file  Social Connections: Not on file    Subjective: Review of Systems  Constitutional:  Negative for chills and fever.  HENT:  Negative for congestion and hearing loss.   Eyes:  Negative for blurred vision and double vision.  Respiratory:  Negative for cough and shortness of breath.   Cardiovascular:  Negative for chest pain and palpitations.  Gastrointestinal:  Positive for  abdominal pain. Negative for blood in stool, constipation, diarrhea, heartburn, melena and vomiting.  Genitourinary:  Negative for dysuria and urgency.  Musculoskeletal:  Negative for joint pain and myalgias.  Skin:  Negative for itching and rash.  Neurological:  Negative for dizziness and headaches.  Psychiatric/Behavioral:  Negative for depression. The patient is not nervous/anxious.      Objective:  BP 118/77   Pulse 67   Temp 98.2 F (36.8 C) (Oral)   Ht 5\' 6"  (1.676 m)   Wt 143 lb 11.2 oz (65.2 kg)   LMP 05/16/2012   BMI 23.19 kg/m  Physical Exam Constitutional:      Appearance: Normal appearance.  HENT:     Head: Normocephalic and atraumatic.  Eyes:     Extraocular Movements: Extraocular movements intact.     Conjunctiva/sclera: Conjunctivae normal.  Cardiovascular:     Rate and Rhythm: Normal rate and regular rhythm.  Pulmonary:     Effort: Pulmonary effort is normal.     Breath sounds: Normal breath sounds.  Abdominal:     General: Bowel sounds are normal.     Palpations: Abdomen is soft.  Musculoskeletal:        General: No swelling. Normal range of motion.     Cervical back: Normal range of motion and neck supple.  Skin:    General: Skin is warm and dry.     Coloration: Skin is not jaundiced.  Neurological:     General: No focal deficit present.     Mental Status: She is alert and oriented to person, place, and time.  Psychiatric:        Mood and Affect: Mood normal.        Behavior: Behavior normal.      Assessment/Plan:  1.  Abdominal pain-significant prior workup per HPI.  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  Vastly improved on BuSpar 5 mg 3 times daily.  Will continue.  Does have a small umbilical hernia seen on MRI, see report in care everywhere.  This was reread by different radiologist in [place].  She does note pain in this region as well as swelling at times.  Will refer to [facility] to discuss possible hernia repair.   2.  Chronic diarrhea-improved  on colestipol but having to take cholestyramine on top of this for breakthrough symptoms.  Will increase colestipol to 2 g twice daily.   3.  Hepatic steatosis, biliary dilatation-was referred to transplant hepatology due to concern for possible PSC.  Workup unremarkable.  Continue to monitor.  4.  Chronic GERD-continue on Rabeprazole, refill today   Follow-up in 3 months or sooner if needed   03/02/2024 1:43 PM   Disclaimer: This note was dictated with voice recognition software. Similar sounding words can inadvertently be transcribed and may not be corrected upon review.

## 2024-03-02 NOTE — Patient Instructions (Signed)
 I am going to refer you to a surgeon to discuss your umbilical hernia.  I have refilled your Aciphex today.  I am going to increase your colestipol to 2 g twice daily.  Follow-up in 3 months.  It was very nice seeing you again today.  Dr. Marletta Lor

## 2024-03-03 ENCOUNTER — Ambulatory Visit: Attending: Family Medicine

## 2024-03-03 ENCOUNTER — Other Ambulatory Visit: Payer: Self-pay

## 2024-03-03 ENCOUNTER — Encounter: Payer: Self-pay | Admitting: Internal Medicine

## 2024-03-03 DIAGNOSIS — J449 Chronic obstructive pulmonary disease, unspecified: Secondary | ICD-10-CM | POA: Insufficient documentation

## 2024-03-03 DIAGNOSIS — I2510 Atherosclerotic heart disease of native coronary artery without angina pectoris: Secondary | ICD-10-CM | POA: Insufficient documentation

## 2024-03-03 DIAGNOSIS — E7800 Pure hypercholesterolemia, unspecified: Secondary | ICD-10-CM | POA: Insufficient documentation

## 2024-03-03 DIAGNOSIS — K219 Gastro-esophageal reflux disease without esophagitis: Secondary | ICD-10-CM | POA: Insufficient documentation

## 2024-03-03 DIAGNOSIS — E1165 Type 2 diabetes mellitus with hyperglycemia: Secondary | ICD-10-CM | POA: Insufficient documentation

## 2024-03-03 DIAGNOSIS — I10 Essential (primary) hypertension: Secondary | ICD-10-CM | POA: Insufficient documentation

## 2024-03-03 DIAGNOSIS — E785 Hyperlipidemia, unspecified: Secondary | ICD-10-CM | POA: Insufficient documentation

## 2024-03-03 DIAGNOSIS — E119 Type 2 diabetes mellitus without complications: Secondary | ICD-10-CM | POA: Insufficient documentation

## 2024-03-03 LAB — COMPREHENSIVE METABOLIC PNL, FASTING
ALBUMIN: 3.6 g/dL (ref 3.5–5.0)
ALKALINE PHOSPHATASE: 100 U/L (ref 50–130)
ALT (SGPT): 26 U/L (ref <31)
ANION GAP: 8 mmol/L (ref 4–13)
AST (SGOT): 32 U/L (ref 11–34)
BILIRUBIN TOTAL: 0.4 mg/dL (ref 0.3–1.3)
BUN/CREA RATIO: 11 (ref 6–22)
BUN: 11 mg/dL (ref 8–25)
CALCIUM: 9.2 mg/dL (ref 8.6–10.2)
CHLORIDE: 109 mmol/L (ref 96–111)
CO2 TOTAL: 24 mmol/L (ref 22–30)
CREATININE: 0.99 mg/dL (ref 0.60–1.05)
ESTIMATED GFR - FEMALE: 66 mL/min/BSA (ref >=60)
GLUCOSE: 177 mg/dL — ABNORMAL HIGH (ref 70–99)
POTASSIUM: 4.2 mmol/L (ref 3.5–5.1)
PROTEIN TOTAL: 6.8 g/dL (ref 6.4–8.3)
SODIUM: 141 mmol/L (ref 136–145)

## 2024-03-03 LAB — CBC WITH DIFF
BASOPHIL #: <0.1 10*3/uL (ref <=0.20)
BASOPHIL %: 0.5 %
EOSINOPHIL #: 0.15 10*3/uL (ref <=0.50)
EOSINOPHIL %: 2 %
HCT: 42.9 % (ref 34.8–46.0)
HGB: 13.9 g/dL (ref 11.5–16.0)
IMMATURE GRANULOCYTE #: <0.1 10*3/uL (ref <0.10)
IMMATURE GRANULOCYTE %: 0.3 % (ref 0.0–1.0)
LYMPHOCYTE #: 2.35 10*3/uL (ref 1.00–4.80)
LYMPHOCYTE %: 30.8 %
MCH: 29.7 pg (ref 26.0–32.0)
MCHC: 32.4 g/dL (ref 31.0–35.5)
MCV: 91.7 fL (ref 78.0–100.0)
MONOCYTE #: 0.77 10*3/uL (ref 0.20–1.10)
MONOCYTE %: 10.1 %
MPV: 9.4 fL (ref 8.7–12.5)
NEUTROPHIL #: 4.3 10*3/uL (ref 1.50–7.70)
NEUTROPHIL %: 56.3 %
PLATELETS: 324 10*3/uL (ref 150–400)
RBC: 4.68 10*6/uL (ref 3.85–5.22)
RDW-CV: 15.7 % — ABNORMAL HIGH (ref 11.5–15.5)
WBC: 7.6 10*3/uL (ref 3.7–11.0)

## 2024-03-03 LAB — LIPID PANEL
CHOL/HDL RATIO: 3
CHOLESTEROL: 149 mg/dL (ref 100–200)
HDL CHOL: 50 mg/dL (ref >=50)
LDL CALC: 77 mg/dL (ref <100)
NON-HDL: 99 mg/dL (ref <=190)
TRIGLYCERIDES: 121 mg/dL (ref <150)
VLDL CALC: 19 mg/dL (ref <30)

## 2024-03-03 LAB — THYROID STIMULATING HORMONE (SENSITIVE TSH): TSH: 2.52 u[IU]/mL (ref 0.350–4.940)

## 2024-03-03 LAB — HGA1C (HEMOGLOBIN A1C WITH EST AVG GLUCOSE): HEMOGLOBIN A1C: 7.8 % — ABNORMAL HIGH (ref <5.7)

## 2024-03-04 ENCOUNTER — Other Ambulatory Visit (HOSPITAL_BASED_OUTPATIENT_CLINIC_OR_DEPARTMENT_OTHER): Payer: Self-pay | Admitting: Family

## 2024-03-04 ENCOUNTER — Ambulatory Visit (INDEPENDENT_AMBULATORY_CARE_PROVIDER_SITE_OTHER): Payer: Self-pay | Admitting: Family Medicine

## 2024-03-04 NOTE — Result Encounter Note (Signed)
 Please inform the patient that her hemoglobin A1c is 7.8%.  This is significantly improved from 6 months ago when she was 8.9%.  Encouraged her to keep up the good work.      Her metabolic panel showed normal electrolytes, good kidney and good liver function.      Her blood count showed no sign of infection, no sign of anemia common her platelet count was within normal limits.      Her thyroid  levels were within normal limits.      Her lipid panel showed her total cholesterol and her bad cholesterol were both significantly improved.      Given these results a making no changes to her plan of care.      Will review these results together at our next appointment.      TY TH

## 2024-03-11 ENCOUNTER — Encounter (HOSPITAL_BASED_OUTPATIENT_CLINIC_OR_DEPARTMENT_OTHER): Payer: Self-pay | Admitting: Internal Medicine

## 2024-03-13 ENCOUNTER — Encounter (INDEPENDENT_AMBULATORY_CARE_PROVIDER_SITE_OTHER): Payer: Self-pay

## 2024-03-13 ENCOUNTER — Other Ambulatory Visit: Payer: Self-pay

## 2024-03-13 ENCOUNTER — Ambulatory Visit: Attending: FAMILY MEDICINE | Admitting: FAMILY MEDICINE

## 2024-03-13 VITALS — BP 110/70 | HR 89 | Temp 97.6°F | Ht 66.0 in | Wt 202.0 lb

## 2024-03-13 DIAGNOSIS — R0981 Nasal congestion: Secondary | ICD-10-CM | POA: Insufficient documentation

## 2024-03-13 DIAGNOSIS — K529 Noninfective gastroenteritis and colitis, unspecified: Secondary | ICD-10-CM | POA: Insufficient documentation

## 2024-03-13 LAB — POCT RAPID COVID-19 & FLU (AMB ONLY)
COVID-19 AG: NEGATIVE
INFLUENZA TYPE A: NEGATIVE
INFLUENZA TYPE B: NEGATIVE

## 2024-03-13 MED ORDER — ONDANSETRON 4 MG DISINTEGRATING TABLET
4.0000 mg | ORAL_TABLET | Freq: Three times a day (TID) | ORAL | 0 refills | Status: AC | PRN
Start: 2024-03-13 — End: ?

## 2024-03-13 NOTE — Nursing Note (Signed)
 03/13/24 1300   Rapid Flu A/B and COVID   Time Performed 1343   Rapid Flu A Result Negative   Rapid Flu B Result Negative   Rapid COVID Result  Negative   Lot# [id number]   Expiration Date 09/06/25   Internal Control Valid yes   Influenza Culture Sent No   COVID Culture Sent No   POCT Instrument Liat   Initials jm

## 2024-03-13 NOTE — Progress Notes (Signed)
 [facility], [facility]  [address]  [place] [address]  Operated by [facility]  Progress Note    Name: Monique [name] MRN:  [id number]   Date: 03/13/2024 DOB:  08/23/1966 (57 y.o.)           Assessment   Problem List Items Addressed This Visit    None  Visit Diagnoses         Gastroenteritis    -  Primary    Relevant Orders    ROUTINE STOOL CULTURE (INCLUDING E. COLI SHIGA TOXIN)      Congestion of nasal sinus        Relevant Orders    POCT Rapid Covid & Flu (AMB Only) (Completed)          Plan   Rapid flu and COVID negative.  Patient appears to be experiencing likely viral gastroenteritis.  Advise rehydration with electrolyte solution and since Zofran for nausea and vomiting.  Counseled that if it is viral symptoms should start to improve over the next few days however if not patient can use stool culture collection kit and leave a sample to further evaluate for bacterial etiology at that point.  Advised that if she develops abdominal pain and fevers to be evaluated.    Bradd Burner, MD     History of present Illness:  Monique [name] is an 58 y.o. female presenting for evaluation of nausea, vomiting and diarrhea with general malaise and body aches for the last 3 days roughly.  Also notes some congestion.  No sore throat or cough reported.  Afebrile.  Describes about 4-5 episodes of vomiting and diarrhea per day.  No blood in the diarrhea.  Describes abdominal cramping but no significant pain.  Denies recent travel or known sick contacts or suspicious foods prior to onset  Status post cholecystectomy.  Subjective   Chief Complaint   Patient presents with    Nausea     Patient here with c/o nausea, vomiting, weakness, runny nose, abdominal pain, diarrhea, decreased appetite, body aches, and headaches. Onset 3 days.     Review of Systems:  ROS negative except noted in HPI     Objective   Vitals:    03/13/24 1316   BP: 110/70   Pulse: 89   Temp: 36.4 C (97.6 F)   SpO2:  97%   Weight: 91.6 kg (202 lb)   Height: 1.676 m (5\' 6" )   BMI: 32.6      Physical Exam  Constitutional:       General: She is not in acute distress.     Appearance: She is not toxic-appearing.   Cardiovascular:      Rate and Rhythm: Normal rate and regular rhythm.   Pulmonary:      Effort: Pulmonary effort is normal. No respiratory distress.      Breath sounds: Normal breath sounds.   Abdominal:      Comments: Soft and minimally tender in the epigastric area   Skin:     General: Skin is warm and dry.   Neurological:      Mental Status: She is alert.   Psychiatric:         Mood and Affect: Mood normal.          There are no Patient Instructions on file for this visit.

## 2024-03-17 ENCOUNTER — Encounter: Payer: Self-pay | Admitting: Surgery

## 2024-03-17 ENCOUNTER — Ambulatory Visit: Admitting: Surgery

## 2024-03-17 VITALS — BP 119/72 | HR 75 | Temp 98.1°F | Resp 12 | Ht 66.0 in | Wt 148.0 lb

## 2024-03-17 DIAGNOSIS — K439 Ventral hernia without obstruction or gangrene: Secondary | ICD-10-CM | POA: Diagnosis not present

## 2024-03-17 NOTE — Progress Notes (Unsigned)
 [facility] History and Physical  Reason for Referral:*** Referring Physician: ***  Chief Complaint   New Patient (Initial Visit)     Ann [name] is a 58 y.o. female.  HPI: ***.  The *** started *** and has had a duration of ***.  It is associated with ***.  The *** is improved with ***, and is made worse with ***.    Quality*** Context***  Past Medical History:  Diagnosis Date   Anxiety    Arthritis    Chronic back pain    Complication of anesthesia    pt states, "i have migraine and high BP after surgery.".   Depression    GERD (gastroesophageal reflux disease)    Migraines    Pancreatitis     Past Surgical History:  Procedure Laterality Date   BILE DUCT STENT PLACEMENT  07/2011   Dr. Steffanie Dunn: fully covered metal wall stent placed.    BIOPSY  12/05/2022   Procedure: BIOPSY;  Surgeon: Lanelle Bal, DO;  [facility];  Service: Endoscopy;;   CHOLECYSTECTOMY  2010   with bile leak   COLONOSCOPY  2011   [facility]   COLONOSCOPY WITH PROPOFOL N/A 03/02/2018   two 2-3 mm hyperplastic polyps, torturous left colon, mild diverticulosis in rectosigmoid, internal hemorrhoids, colonic biopsies negative   ERCP  2011-2013   multiple, with multiple stent placements/exchanges. Metal wall stent placed in Aug 2012 AND STILL PRESENT AS OF 2019   ESOPHAGOGASTRODUODENOSCOPY (EGD) WITH PROPOFOL N/A 03/02/2018   dysphagia due to benign-appearing esophageal stricture/GERD, s/p dilation, small hiatal hernia, gastritis.    ESOPHAGOGASTRODUODENOSCOPY (EGD) WITH PROPOFOL N/A 12/05/2022   gastritis, overall unrevealing.   SAVORY DILATION N/A 03/02/2018   Procedure: SAVORY DILATION;  Surgeon: West Bali, MD;  [facility];  Service: Endoscopy;  Laterality: N/A;    Family History  Problem Relation Age of Onset   Cervical cancer Mother    Heart attack Other    Migraines Neg Hx    Seizures Neg Hx    Dementia Neg Hx    Colon  cancer Neg Hx     Social History   Tobacco Use   Smoking status: Former    Current packs/day: 0.00    Average packs/day: 0.3 packs/day for 10.0 years (2.5 ttl pk-yrs)    Types: Cigarettes    Start date: 12/08/2001    Quit date: 12/09/2011    Years since quitting: 12.2   Smokeless tobacco: Never  Vaping Use   Vaping status: Never Used  Substance Use Topics   Alcohol use: No   Drug use: No    Medications: {medication reviewed/display:3041432} Allergies as of 03/17/2024       Reactions   Imitrex [sumatriptan] Anaphylaxis, Rash   Cymbalta [duloxetine Hcl] Diarrhea, Nausea Only, Other (See Comments)   Headaches.   Dicyclomine    DIARRHEA   Doxycycline Diarrhea   Very severe diarrhea    Gabapentin Other (See Comments)   Impairs coordination/blurred vision.   Lyrica [pregabalin] Swelling, Other (See Comments)   Swelling of legs and ankles Blurry Vision    Nucynta [tapentadol]    Migraine   Nuvigil [armodafinil] Other (See Comments)   Headaches.   Other    "most antibiotics."   Penicillins Other (See Comments)   Headaches Has patient had a PCN reaction causing immediate rash, facial/tongue/throat swelling, SOB or lightheadedness with hypotension: No Has patient had a PCN reaction causing severe rash involving mucus membranes or skin  necrosis: No Has patient had a PCN reaction that required hospitalization:No Has patient had a PCN reaction occurring within the last 10 years: No If all of the above answers are "NO", then may proceed with Cephalosporin use.   Tape    Skin peeling/irriation   Amoxicillin-pot Clavulanate Diarrhea, Rash   Has patient had a PCN reaction causing immediate rash, facial/tongue/throat swelling, SOB or lightheadedness with hypotension: No Has patient had a PCN reaction causing severe rash involving mucus membranes or skin necrosis: No Has patient had a PCN reaction that required hospitalization:No Has patient had a PCN reaction occurring within the  last 10 years: No If all of the above answers are "NO", then may proceed with Cephalosporin use.   Butrans [buprenorphine] Hives, Swelling, Rash   Swelling of ankles    Ciprofloxacin Diarrhea, Nausea And Vomiting, Other (See Comments)   Severe headaches.        Medication List        Accurate as of March 17, 2024  3:14 PM. If you have any questions, ask your nurse or doctor.          STOP taking these medications    diclofenac 75 MG EC tablet Commonly known as: VOLTAREN Stopped by: Keelan Tripodi A Cletus Mehlhoff       TAKE these medications    ALPRAZolam 1 MG tablet Commonly known as: XANAX Take 0.5-1 mg by mouth 2 (two) times daily as needed for anxiety or sleep.   amphetamine-dextroamphetamine 20 MG tablet Commonly known as: ADDERALL Take 20 mg by mouth every morning. As needed   busPIRone 10 MG tablet Commonly known as: BUSPAR Take 1 tablet (10 mg total) by mouth 3 (three) times daily.   colestipol 1 g tablet Commonly known as: Colestid Take 2 tablets (2 g total) by mouth 2 (two) times daily.   Nurtec 75 MG Tbdp Generic drug: Rimegepant Sulfate Take by mouth daily at 6 (six) AM.   ondansetron 8 MG disintegrating tablet Commonly known as: ZOFRAN-ODT Take 8 mg by mouth every 8 (eight) hours as needed for nausea or vomiting.   RABEprazole 20 MG tablet Commonly known as: Aciphex Take 1 tablet (20 mg total) by mouth daily.         ROS:  {Review of Systems:30496}  Blood pressure 119/72, pulse 75, temperature 98.1 F (36.7 C), temperature source Oral, resp. rate 12, height 5\' 6"  (1.676 m), weight 148 lb (67.1 kg), last menstrual period 05/16/2012, SpO2 96%. Physical Exam  Results: No results found for this or any previous visit (from the past 48 hours).  No results found.   Assessment & Plan:  [name] is a 58 y.o. female with *** -*** -*** -Follow up ***  All questions were answered to the satisfaction of the patient and family***.  The  risk and benefits of *** were discussed including but not limited to ***.  After careful consideration, [name] has decided to ***.    [name] A [name] 03/17/2024, 3:14 PM   Note: Portions of this report may have been transcribed using voice recognition software. Every effort has been made to ensure accuracy; however, inadvertent computerized transcription errors may still be present.   Theophilus Kinds, DO [facility] [address] [place], [place] [address] [phone] (office)

## 2024-03-18 NOTE — H&P (Signed)
 [facility] History and Physical  Reason for Referral: Umbilical hernia Referring Physician: Dr. Marletta Lor  Chief Complaint   New Patient (Initial Visit)     Ann [name] is a 58 y.o. female.  HPI: Patient presents for evaluation of an umbilical hernia.  She has been seeing GI for at least 6 years for chronic abdominal pains.  She had an umbilical hernia evaluated many years ago, and was told it was likely not the source of her pain.  More recently she has had increasing pain at the hernia site and pain after eating.  She has undergone extensive workup with GI with MRI/MRCP done in August of last year to evaluate for liver disease.  She was advised that she just had fatty liver.  On her MRCP, she was noted to have an umbilical hernia, and questions whether this might be the source of some of her abdominal pains.  She occasionally will have nausea and vomiting, but she is tolerating a diet currently without issue.  She is moving her bowels normally for her, which is usually some form of diarrhea.  Her past medical history is significant for hyperlipidemia, anxiety, ADD, GERD, and insomnia for which she takes Xanax.  She denies use of blood thinning medications.  Her surgical history is significant for a laparoscopic cholecystectomy, at which time she suffered a bile leak and required ERCP with common bile duct stent placement.  She denies use of tobacco products, alcohol, and illicit drugs.  Past Medical History:  Diagnosis Date   Anxiety    Arthritis    Chronic back pain    Complication of anesthesia    pt states, "i have migraine and high BP after surgery.".   Depression    GERD (gastroesophageal reflux disease)    Migraines    Pancreatitis     Past Surgical History:  Procedure Laterality Date   BILE DUCT STENT PLACEMENT  07/2011   Dr. Steffanie Dunn: fully covered metal wall stent placed.    BIOPSY  12/05/2022   Procedure: BIOPSY;  Surgeon: Lanelle Bal, DO;   Location: [facility];  Service: Endoscopy;;   CHOLECYSTECTOMY  2010   with bile leak   COLONOSCOPY  2011   [facility]   COLONOSCOPY WITH PROPOFOL N/A 03/02/2018   two 2-3 mm hyperplastic polyps, torturous left colon, mild diverticulosis in rectosigmoid, internal hemorrhoids, colonic biopsies negative   ERCP  2011-2013   multiple, with multiple stent placements/exchanges. Metal wall stent placed in Aug 2012 AND STILL PRESENT AS OF 2019   ESOPHAGOGASTRODUODENOSCOPY (EGD) WITH PROPOFOL N/A 03/02/2018   dysphagia due to benign-appearing esophageal stricture/GERD, s/p dilation, small hiatal hernia, gastritis.    ESOPHAGOGASTRODUODENOSCOPY (EGD) WITH PROPOFOL N/A 12/05/2022   gastritis, overall unrevealing.   SAVORY DILATION N/A 03/02/2018   Procedure: SAVORY DILATION;  Surgeon: West Bali, MD;  Location: [facility];  Service: Endoscopy;  Laterality: N/A;    Family History  Problem Relation Age of Onset   Cervical cancer Mother    Heart attack Other    Migraines Neg Hx    Seizures Neg Hx    Dementia Neg Hx    Colon cancer Neg Hx     Social History   Tobacco Use   Smoking status: Former    Current packs/day: 0.00    Average packs/day: 0.3 packs/day for 10.0 years (2.5 ttl pk-yrs)    Types: Cigarettes    Start date: 12/08/2001    Quit date: 12/09/2011  Years since quitting: 12.2   Smokeless tobacco: Never  Vaping Use   Vaping status: Never Used  Substance Use Topics   Alcohol use: No   Drug use: No    Medications: I have reviewed the patient's current medications. Allergies as of 03/17/2024       Reactions   Imitrex [sumatriptan] Anaphylaxis, Rash   Cymbalta [duloxetine Hcl] Diarrhea, Nausea Only, Other (See Comments)   Headaches.   Dicyclomine    DIARRHEA   Doxycycline Diarrhea   Very severe diarrhea    Gabapentin Other (See Comments)   Impairs coordination/blurred vision.   Lyrica [pregabalin] Swelling, Other (See Comments)   Swelling of legs  and ankles Blurry Vision    Nucynta [tapentadol]    Migraine   Nuvigil [armodafinil] Other (See Comments)   Headaches.   Other    "most antibiotics."   Penicillins Other (See Comments)   Headaches Has patient had a PCN reaction causing immediate rash, facial/tongue/throat swelling, SOB or lightheadedness with hypotension: No Has patient had a PCN reaction causing severe rash involving mucus membranes or skin necrosis: No Has patient had a PCN reaction that required hospitalization:No Has patient had a PCN reaction occurring within the last 10 years: No If all of the above answers are "NO", then may proceed with Cephalosporin use.   Tape    Skin peeling/irriation   Amoxicillin-pot Clavulanate Diarrhea, Rash   Has patient had a PCN reaction causing immediate rash, facial/tongue/throat swelling, SOB or lightheadedness with hypotension: No Has patient had a PCN reaction causing severe rash involving mucus membranes or skin necrosis: No Has patient had a PCN reaction that required hospitalization:No Has patient had a PCN reaction occurring within the last 10 years: No If all of the above answers are "NO", then may proceed with Cephalosporin use.   Butrans [buprenorphine] Hives, Swelling, Rash   Swelling of ankles    Ciprofloxacin Diarrhea, Nausea And Vomiting, Other (See Comments)   Severe headaches.        Medication List        Accurate as of March 17, 2024  3:14 PM. If you have any questions, ask your nurse or doctor.          STOP taking these medications    diclofenac 75 MG EC tablet Commonly known as: VOLTAREN Stopped by: Nuha Degner A Akul Leggette       TAKE these medications    ALPRAZolam 1 MG tablet Commonly known as: XANAX Take 0.5-1 mg by mouth 2 (two) times daily as needed for anxiety or sleep.   amphetamine-dextroamphetamine 20 MG tablet Commonly known as: ADDERALL Take 20 mg by mouth every morning. As needed   busPIRone 10 MG tablet Commonly known as:  BUSPAR Take 1 tablet (10 mg total) by mouth 3 (three) times daily.   colestipol 1 g tablet Commonly known as: Colestid Take 2 tablets (2 g total) by mouth 2 (two) times daily.   Nurtec 75 MG Tbdp Generic drug: Rimegepant Sulfate Take by mouth daily at 6 (six) AM.   ondansetron 8 MG disintegrating tablet Commonly known as: ZOFRAN-ODT Take 8 mg by mouth every 8 (eight) hours as needed for nausea or vomiting.   RABEprazole 20 MG tablet Commonly known as: Aciphex Take 1 tablet (20 mg total) by mouth daily.         ROS:  Constitutional: positive for chills, negative for fatigue and fevers Eyes: negative for visual disturbance and pain Ears, nose, mouth, throat, and face: positive for sore throat,  negative for ear drainage and sinus problems Respiratory: negative for cough, wheezing, and shortness of breath Cardiovascular: positive for chest pain, negative for palpitations Gastrointestinal: positive for abdominal pain, nausea, and reflux symptoms, negative for vomiting Genitourinary:negative for dysuria and frequency Integument/breast: positive for dryness, negative for rash Hematologic/lymphatic: negative for bleeding and lymphadenopathy Musculoskeletal:positive for back pain and neck pain Neurological: negative for dizziness and tremors Endocrine: negative for temperature intolerance  Blood pressure 119/72, pulse 75, temperature 98.1 F (36.7 C), temperature source Oral, resp. rate 12, height 5\' 6"  (1.676 m), weight 148 lb (67.1 kg), last menstrual period 05/16/2012, SpO2 96%. Physical Exam Vitals reviewed.  Constitutional:      Appearance: Normal appearance.  HENT:     Head: Normocephalic and atraumatic.  Eyes:     Extraocular Movements: Extraocular movements intact.     Pupils: Pupils are equal, round, and reactive to light.  Cardiovascular:     Rate and Rhythm: Normal rate and regular rhythm.  Pulmonary:     Effort: Pulmonary effort is normal.     Breath sounds:  Normal breath sounds.  Abdominal:     Comments: Abdomen soft, nondistended, no percussion tenderness, nontender to palpation; no rigidity, guarding, rebound tenderness; soft and reducible supraumbilical hernia, mild tenderness to palpation, hernia 1.5 cm in size  Musculoskeletal:        General: Normal range of motion.     Cervical back: Normal range of motion.  Skin:    General: Skin is warm and dry.  Neurological:     General: No focal deficit present.     Mental Status: She is alert and oriented to person, place, and time.  Psychiatric:        Mood and Affect: Mood normal.        Behavior: Behavior normal.     Results: MRCP (07/25/2023): IMPRESSION: 1. Status post cholecystectomy. 2. Postoperative pneumobilia without common bile duct dilatation or acute findings on today's examination. 3. Mild segmental biliary ductal dilatation in the anterior right lobe of the liver, hepatic segments V and VIII, unchanged. No evident mass or other obstructing etiology. 4. Hepatic steatosis.  **Upon my evaluation of the MRCP, the patient does appear to have a fat filled umbilical hernia, though it is not documented in the body or impression of the report   Assessment & Plan:  LOUISIANA SEARLES is a 58 y.o. female who presents for evaluation of an umbilical hernia.  -I discussed the pathophysiology of umbilical hernias and we discussed the need for surgical repair.  We further discussed that this hernia is definitely contributing to some of her abdominal symptoms, though repair of the hernia will likely not improve every abdominal symptom that the patient is currently experiencing.  Patient is agreeable and understands -The risk and benefits of robotic assisted laparoscopic supraumbilical hernia repair with mesh were discussed including but not limited to bleeding, infection, injury to surrounding structures, need for additional procedures, and hernia recurrence.  After careful consideration,  KJERSTIN ABRIGO has decided to proceed with surgery.  -Patient tentatively scheduled for surgery on 5/5 -Information provided to the patient regarding umbilical hernias -Advised that the patient should present to the ED if they begin to have painful nonreducible bulge at her umbilicus, nausea, vomiting, and obstipation  All questions were answered to the satisfaction of the patient.  Note: Portions of this report may have been transcribed using voice recognition software. Every effort has been made to ensure accuracy; however, inadvertent computerized transcription errors may  still be present.   Theophilus Kinds, DO [facility] [address] [place], [place] [address] [phone] (office)

## 2024-03-21 ENCOUNTER — Encounter (INDEPENDENT_AMBULATORY_CARE_PROVIDER_SITE_OTHER): Payer: Self-pay

## 2024-03-23 ENCOUNTER — Other Ambulatory Visit (INDEPENDENT_AMBULATORY_CARE_PROVIDER_SITE_OTHER): Payer: Self-pay | Admitting: Family Medicine

## 2024-03-23 DIAGNOSIS — R110 Nausea: Secondary | ICD-10-CM

## 2024-03-28 ENCOUNTER — Ambulatory Visit: Attending: FAMILY MEDICINE | Admitting: FAMILY MEDICINE

## 2024-03-28 ENCOUNTER — Encounter (INDEPENDENT_AMBULATORY_CARE_PROVIDER_SITE_OTHER): Payer: Self-pay

## 2024-03-28 ENCOUNTER — Other Ambulatory Visit: Payer: Self-pay

## 2024-03-28 VITALS — BP 118/80 | HR 89 | Temp 97.9°F | Resp 20 | Ht 66.0 in | Wt 201.0 lb

## 2024-03-28 DIAGNOSIS — R051 Acute cough: Secondary | ICD-10-CM | POA: Insufficient documentation

## 2024-03-28 DIAGNOSIS — J029 Acute pharyngitis, unspecified: Secondary | ICD-10-CM | POA: Insufficient documentation

## 2024-03-28 DIAGNOSIS — J020 Streptococcal pharyngitis: Secondary | ICD-10-CM | POA: Insufficient documentation

## 2024-03-28 LAB — POCT RAPID COVID-19 & FLU (AMB ONLY)
COVID-19 AG: NEGATIVE
INFLUENZA TYPE A: NEGATIVE
INFLUENZA TYPE B: NEGATIVE

## 2024-03-28 LAB — POCT RAPID STREP A

## 2024-03-28 MED ORDER — AMOXICILLIN 500 MG CAPSULE
500.0000 mg | ORAL_CAPSULE | Freq: Two times a day (BID) | ORAL | 0 refills | Status: AC
Start: 2024-03-28 — End: 2024-04-07

## 2024-03-28 NOTE — Progress Notes (Signed)
 [facility]  [address]  [place] [address]  Operated by [facility]  Progress Note    Name: Samani Deal MRN:  [id number]   Date: 03/28/2024 DOB:  1966-04-18 (58 y.o.)           Assessment   Problem List Items Addressed This Visit    None  Visit Diagnoses         Strep pharyngitis    -  Primary      Acute cough        Relevant Orders    POCT Rapid Covid & Flu (AMB Only) (Completed)      Sore throat        Relevant Orders    POCT RAPID STREP A (Completed)          Plan   Rapid strep positive.  Flu and COVID negative.  Sent amoxicillin  advised aggressive nasal saline irrigation.    Tomie Foyer, MD     History of present Illness:  Mayrene Bastarache is an 58 y.o. female presenting for evaluation of roughly 1-2 days of nasal congestion, sore throat and some cough.  Afebrile.  Reports some general fatigue but no body aches.  No reported history of asthma and denies chronic obstructive pulmonary disease however chart review suggests chronic obstructive pulmonary disease diagnosis.    Subjective   Chief Complaint   Patient presents with    Congestion     Pt c/o head congestion and sore throat that started 2 days ago. Non productive cough, but mostly stuffy. Pt. States she has been really tired.      Review of Systems:  ROS negative except noted in HPI     Objective   Vitals:    03/28/24 0831   BP: 118/80   Pulse: 89   Resp: 20   Temp: 36.6 C (97.9 F)   TempSrc: Tympanic   SpO2: 96%   Weight: 91.2 kg (201 lb)   Height: 1.676 m (5\' 6" )   BMI: 32.44      Physical Exam  Constitutional:       General: She is not in acute distress.     Appearance: She is not toxic-appearing.   HENT:      Mouth/Throat:      Comments: Minimal pharyngeal erythema with no exudates  Cardiovascular:      Rate and Rhythm: Normal rate.   Pulmonary:      Effort: Pulmonary effort is normal. No respiratory distress.      Breath sounds: Normal breath sounds.   Skin:     General: Skin is warm and  dry.   Neurological:      Mental Status: She is alert.   Psychiatric:         Mood and Affect: Mood normal.          There are no Patient Instructions on file for this visit.

## 2024-03-28 NOTE — Nursing Note (Signed)
 03/28/24 0900   Required: Location Test Performed At:   [facility] (Office) - [address], [place], [place] [address]   Rapid Strep   Time Performed 4696   Rapid Strep Positive   Lot# [id number]   Expiration Date 09/06/25   Internal Control Valid yes   Ordering Physician Dr. Felipe Horton   Throat Culture Sent No   Nurse initials RD   Rapid Flu A/B and COVID   Time Performed 0906   Rapid Flu A Result Negative   Rapid Flu B Result Negative   Rapid COVID Result  Negative   Lot# [id number]   Expiration Date 09/06/25   Internal Control Valid yes   Influenza Culture Sent No   COVID Culture Sent No   Initials RD

## 2024-03-29 ENCOUNTER — Encounter (INDEPENDENT_AMBULATORY_CARE_PROVIDER_SITE_OTHER): Payer: Self-pay | Admitting: Family Medicine

## 2024-03-31 ENCOUNTER — Encounter: Payer: Self-pay | Admitting: Internal Medicine

## 2024-04-06 NOTE — Patient Instructions (Signed)
 LOISTINE CLOOS  04/06/2024     @PREFPERIOPPHARMACY @   Your procedure is scheduled on 5/5.  Report to [facility] at 0740 A.M.  Call this number if you have problems the morning of surgery:  [phone]  If you experience any cold or flu symptoms such as cough, fever, chills, shortness of breath, etc. between now and your scheduled surgery, please notify us  at the above number.   Remember:  Do not eat or drink after midnight.  You may drink clear liquids until 0540 .  Clear liquids allowed are:                    Water, Juice (No red color; non-citric and without pulp; diabetics please choose diet or no sugar options), Carbonated beverages (diabetics please choose diet or no sugar options), Clear Tea (No creamer, milk, or cream, including half & half and powdered creamer), and Black Coffee Only (No creamer, milk or cream, including half & half and powdered creamer)    Take these medicines the morning of surgery with A SIP OF WATER buspar , aciphex , and xanax and zofran  if needed    Do not wear jewelry, make-up or nail polish, including gel polish,  artificial nails, or any other type of covering on natural nails (fingers and  toes).  Do not wear lotions, powders, or perfumes, or deodorant.  Do not shave 48 hours prior to surgery.  Men may shave face and neck.  Do not bring valuables to the hospital.  [facility] is not responsible for any belongings or valuables.  Contacts, dentures or bridgework may not be worn into surgery.  Leave your suitcase in the car.  After surgery it may be brought to your room.  For patients admitted to the hospital, discharge time will be determined by your treatment team.  Patients discharged the day of surgery will not be allowed to drive home.   Name and phone number of your driver:   family Special instructions:    Please read over the following fact sheets that you were given. Pain Booklet, Coughing and Deep Breathing, Surgical Site Infection  Prevention, Anesthesia Post-op Instructions, and Care and Recovery After Surgery      How to Use Chlorhexidine  at Home in the Shower Chlorhexidine  gluconate (CHG) is a germ-killing (antiseptic) wash that's used to clean the skin. It can get rid of the germs that normally live on the skin and can keep them away for about 24 hours. If you're having surgery, you may be told to shower with CHG at home the night before surgery. This can help lower your risk for infection. To use CHG wash in the shower, follow the steps below. Supplies needed: CHG body wash. Clean washcloth. Clean towel. How to use CHG in the shower Follow these steps unless you're told to use CHG in a different way: Start the shower. Use your normal soap and shampoo to wash your face and hair. Turn off the shower or move out of the shower stream. Pour CHG onto a clean washcloth. Do not use any type of brush or rough sponge. Start at your neck, washing your body down to your toes. Make sure you: Wash the part of your body where the surgery will be done for at least 1 minute. Do not scrub. Do not use CHG on your head or face unless your health care provider tells you to. If it gets into your ears or eyes, rinse them well with water.  Do not wash your genitals with CHG. Wash your back and under your arms. Make sure to wash skin folds. Let the CHG sit on your skin for 1-2 minutes or as long as told. Rinse your entire body in the shower, including all body creases and folds. Turn off the shower. Dry off with a clean towel. Do not put anything on your skin afterward, such as powder, lotion, or perfume. Put on clean clothes or pajamas. If it's the night before surgery, sleep in clean sheets. General tips Use CHG only as told, and follow the instructions on the label. Use the full amount of CHG as told. This is often one bottle. Do not smoke and stay away from flames after using CHG. Your skin may feel sticky after using CHG.  This is normal. The sticky feeling will go away as the CHG dries. Do not use CHG: If you have a chlorhexidine  allergy or have reacted to chlorhexidine  in the past. On open wounds or areas of skin that have broken skin, cuts, or scrapes. On babies younger than 76 months of age. Contact a health care provider if: You have questions about using CHG. Your skin gets irritated or itchy. You have a rash after using CHG. You swallow any CHG. Call your local poison control center [phone] in the [place] Your eyes itch badly, or they become very red or swollen. Your hearing changes. You have trouble seeing. If you can't reach your provider, go to an urgent care or emergency room. Do not drive yourself. Get help right away if: You have swelling or tingling in your mouth or throat. You make high-pitched whistling sounds when you breathe, most often when you breathe out (wheeze). You have trouble breathing. These symptoms may be an emergency. Call 911 right away. Do not wait to see if the symptoms will go away. Do not drive yourself to the hospital. This information is not intended to replace advice given to you by your health care provider. Make sure you discuss any questions you have with your health care provider. Document Revised: 06/09/2023 Document Reviewed: 06/05/2022 Elsevier Patient Education  2024 Else[employer] Inc.       General Anesthesia, Adult, Care After The following information offers guidance on how to care for yourself after your procedure. Your health care provider may also give you more specific instructions. If you have problems or questions, contact your health care provider. What can I expect after the procedure? After the procedure, it is common for people to: Have pain or discomfort at the IV site. Have nausea or vomiting. Have a sore throat or hoarseness. Have trouble concentrating. Feel cold or chills. Feel weak, sleepy, or tired (fatigue). Have soreness and body  aches. These can affect parts of the body that were not involved in surgery. Follow these instructions at home: For the time period you were told by your health care provider:  Rest. Do not participate in activities where you could fall or become injured. Do not drive or use machinery. Do not drink alcohol. Do not take sleeping pills or medicines that cause drowsiness. Do not make important decisions or sign legal documents. Do not take care of children on your own. General instructions Drink enough fluid to keep your urine pale yellow. If you have sleep apnea, surgery and certain medicines can increase your risk for breathing problems. Follow instructions from your health care provider about wearing your sleep device: Anytime you are sleeping, including during daytime naps. While taking prescription pain  medicines, sleeping medicines, or medicines that make you drowsy. Return to your normal activities as told by your health care provider. Ask your health care provider what activities are safe for you. Take over-the-counter and prescription medicines only as told by your health care provider. Do not use any products that contain nicotine or tobacco. These products include cigarettes, chewing tobacco, and vaping devices, such as e-cigarettes. These can delay incision healing after surgery. If you need help quitting, ask your health care provider. Contact a health care provider if: You have nausea or vomiting that does not get better with medicine. You vomit every time you eat or drink. You have pain that does not get better with medicine. You cannot urinate or have bloody urine. You develop a skin rash. You have a fever. Get help right away if: You have trouble breathing. You have chest pain. You vomit blood. These symptoms may be an emergency. Get help right away. Call 911. Do not wait to see if the symptoms will go away. Do not drive yourself to the hospital. Summary After the  procedure, it is common to have a sore throat, hoarseness, nausea, vomiting, or to feel weak, sleepy, or fatigue. For the time period you were told by your health care provider, do not drive or use machinery. Get help right away if you have difficulty breathing, have chest pain, or vomit blood. These symptoms may be an emergency. This information is not intended to replace advice given to you by your health care provider. Make sure you discuss any questions you have with your health care provider. Document Revised: 02/21/2022 Document Reviewed: 02/21/2022 Elsevier Patient Education  2024 Elsevier Inc.Umbilical Hernia, Adult  A hernia is a lump of tissue that pushes through an opening in the muscles. An umbilical hernia happens in the belly, near the belly button. The hernia may contain tissues from the small or large intestine. It may also have fatty tissue that covers the intestines. Umbilical hernias in adults may get worse over time. They need to be treated with surgery. There are several types of umbilical hernias. They include: Indirect hernia. This occurs just above or below the belly button. It's the most common type of umbilical hernia in adults. Direct hernia. This type occurs in an opening that's formed by the belly button. Reducible hernia. This hernia comes and goes. You may see it only when you strain, cough, or lift something heavy. This type of hernia can be pushed back into the belly (reduced). Incarcerated hernia. This traps the hernia in the wall of the belly. This type of hernia can't be pushed back into the belly. It can cause a strangulated hernia. Strangulated hernia. This hernia cuts off blood flow to the tissues inside the hernia. The tissues can die if this happens. This type of hernia must be treated right away. What are the causes? An umbilical hernia happens when tissue inside the belly pushes through an opening in the muscles of the belly. What increases the  risk? You're more likely to get this hernia if: You strain while lifting or pushing heavy objects. You've had several pregnancies. You have a condition that puts pressure on your belly, and you've had it for a long time. These include: Obesity. A buildup of fluid inside your belly. Vomiting or coughing all the time. Trouble pooping (constipation). You've had surgery that weakened the muscles in the belly. What are the signs or symptoms? The main symptom of this condition is a bulge at the belly  button or near it. The bulge does not cause pain. Other symptoms depend on the type of hernia you have. A reducible hernia may be seen only when you strain, cough, or lift something heavy. Other symptoms may include: Dull pain. A feeling of pressure. An incarcerated hernia may cause very bad pain. Also, you may: Vomit or feel like you may vomit. Not be able to pass gas. A strangulated hernia may cause: Pain that gets worse and worse. Vomiting, or feeling like you may vomit. Pain when you press on the hernia. Change of color on the skin over the hernia. The skin may become red or purple. Trouble pooping. Blood in the poop. How is this diagnosed? This condition may be diagnosed based on: Your symptoms and medical history. A physical exam. You may be asked to cough or strain while standing. These actions will put pressure inside your belly. The pressure can force the hernia through the opening in your muscles. Your health care provider may try to push the hernia back into your belly (reduce). How is this treated? Surgery is the only treatment for an umbilical hernia. Surgery for a strangulated hernia must be done right away. If you have a small hernia that's not incarcerated, you may need to lose weight before the surgery is done. Follow these instructions at home: Managing constipation You may need to take these actions to prevent trouble pooping. This will help to prevent straining. Drink  enough fluid to keep your pee (urine) pale yellow. Take over-the-counter or prescription medicines. Eat foods that are high in fiber, such as beans, whole grains, and fresh fruits and vegetables. Limit foods that are high in fat and sugars, such as fried or sweet foods. General instructions Do not try to push the hernia back in. Lose weight, if told by your provider. Watch your hernia for any changes in color or size. Tell your provider if any changes occur. You may need to avoid activities that put pressure on your hernia. You may have to avoid lifting. Ask your provider how much you can safely lift. Take over-the-counter and prescription medicines only as told by your provider. Contact a health care provider if: Your hernia gets larger or feels hard. Your hernia becomes painful. You get a fever or chills. Get help right away if: You get very bad pain near the area of the hernia, and the pain comes on suddenly. You have pain and you vomit or feel like you may vomit. The skin over your hernia changes color. These symptoms may be an emergency. Get help right away. Call 911. Do not wait to see if the symptoms go away. Do not drive yourself to the hospital. This information is not intended to replace advice given to you by your health care provider. Make sure you discuss any questions you have with your health care provider. Document Revised: 03/17/2023 Document Reviewed: 03/17/2023 Elsevier Patient Education  2024 [employer].

## 2024-04-07 ENCOUNTER — Encounter (HOSPITAL_COMMUNITY): Payer: Self-pay

## 2024-04-07 ENCOUNTER — Encounter (HOSPITAL_COMMUNITY)
Admission: RE | Admit: 2024-04-07 | Discharge: 2024-04-07 | Disposition: A | Discharge status: Home / Self Care | Source: Ambulatory Visit | Attending: Surgery | Admitting: Surgery

## 2024-04-07 ENCOUNTER — Other Ambulatory Visit: Payer: Self-pay

## 2024-04-07 VITALS — BP 119/72 | HR 75 | Temp 98.1°F | Resp 18 | Ht 66.0 in | Wt 148.0 lb

## 2024-04-07 DIAGNOSIS — Z01812 Encounter for preprocedural laboratory examination: Secondary | ICD-10-CM | POA: Diagnosis not present

## 2024-04-07 DIAGNOSIS — Z01818 Encounter for other preprocedural examination: Secondary | ICD-10-CM | POA: Diagnosis present

## 2024-04-07 LAB — CBC
HCT: 37.3 % (ref 36.0–46.0)
Hemoglobin: 12.5 g/dL (ref 12.0–15.0)
MCH: 28.9 pg (ref 26.0–34.0)
MCHC: 33.5 g/dL (ref 30.0–36.0)
MCV: 86.3 fL (ref 80.0–100.0)
Platelets: 176 10*3/uL (ref 150–400)
RBC: 4.32 MIL/uL (ref 3.87–5.11)
RDW: 12.4 % (ref 11.5–15.5)
WBC: 4.8 10*3/uL (ref 4.0–10.5)
nRBC: 0 % (ref 0.0–0.2)

## 2024-04-07 LAB — BASIC METABOLIC PANEL WITH GFR
Anion gap: 9 (ref 5–15)
BUN: 13 mg/dL (ref 6–20)
CO2: 21 mmol/L — ABNORMAL LOW (ref 22–32)
Calcium: 9 mg/dL (ref 8.9–10.3)
Chloride: 109 mmol/L (ref 98–111)
Creatinine, Ser: 0.62 mg/dL (ref 0.44–1.00)
GFR, Estimated: >60 mL/min (ref >60)
Glucose, Bld: 127 mg/dL — ABNORMAL HIGH (ref 70–99)
Potassium: 3.9 mmol/L (ref 3.5–5.1)
Sodium: 139 mmol/L (ref 135–145)

## 2024-04-11 ENCOUNTER — Ambulatory Visit (HOSPITAL_COMMUNITY)
Admission: RE | Admit: 2024-04-11 | Discharge: 2024-04-11 | Disposition: A | Discharge status: Home / Self Care | Attending: Surgery | Admitting: Surgery

## 2024-04-11 ENCOUNTER — Ambulatory Visit (HOSPITAL_BASED_OUTPATIENT_CLINIC_OR_DEPARTMENT_OTHER): Admitting: Anesthesiology

## 2024-04-11 ENCOUNTER — Encounter (HOSPITAL_COMMUNITY)
Admission: RE | Disposition: A | Discharge status: Home / Self Care | Payer: Self-pay | Source: Home / Self Care | Attending: Surgery

## 2024-04-11 ENCOUNTER — Encounter (HOSPITAL_COMMUNITY): Payer: Self-pay | Admitting: Surgery

## 2024-04-11 ENCOUNTER — Ambulatory Visit (HOSPITAL_COMMUNITY): Admitting: Anesthesiology

## 2024-04-11 DIAGNOSIS — K429 Umbilical hernia without obstruction or gangrene: Secondary | ICD-10-CM | POA: Diagnosis not present

## 2024-04-11 DIAGNOSIS — F418 Other specified anxiety disorders: Secondary | ICD-10-CM | POA: Diagnosis not present

## 2024-04-11 DIAGNOSIS — I10 Essential (primary) hypertension: Secondary | ICD-10-CM | POA: Insufficient documentation

## 2024-04-11 DIAGNOSIS — K219 Gastro-esophageal reflux disease without esophagitis: Secondary | ICD-10-CM | POA: Insufficient documentation

## 2024-04-11 DIAGNOSIS — Z87891 Personal history of nicotine dependence: Secondary | ICD-10-CM | POA: Diagnosis not present

## 2024-04-11 DIAGNOSIS — K760 Fatty (change of) liver, not elsewhere classified: Secondary | ICD-10-CM | POA: Insufficient documentation

## 2024-04-11 DIAGNOSIS — K439 Ventral hernia without obstruction or gangrene: Secondary | ICD-10-CM | POA: Diagnosis not present

## 2024-04-11 DIAGNOSIS — F988 Other specified behavioral and emotional disorders with onset usually occurring in childhood and adolescence: Secondary | ICD-10-CM | POA: Insufficient documentation

## 2024-04-11 SURGERY — REPAIR, HERNIA, UMBILICAL, ROBOT-ASSISTED
Anesthesia: General | Site: Abdomen

## 2024-04-11 MED ORDER — SUGAMMADEX SODIUM 200 MG/2ML IV SOLN
INTRAVENOUS | Status: DC | PRN
  Administered 2024-04-11: 200 mg via INTRAVENOUS

## 2024-04-11 MED ORDER — PROPOFOL 10 MG/ML IV BOLUS
INTRAVENOUS | Status: AC
  Filled 2024-04-11: qty 20

## 2024-04-11 MED ORDER — LABETALOL HCL 5 MG/ML IV SOLN
INTRAVENOUS | Status: AC
Start: 2024-04-11 — End: ?
  Filled 2024-04-11: qty 4

## 2024-04-11 MED ORDER — OXYCODONE HCL 5 MG PO TABS
5.0000 mg | ORAL_TABLET | Freq: Once | ORAL | Status: AC | PRN
  Administered 2024-04-11: 5 mg via ORAL

## 2024-04-11 MED ORDER — MIDAZOLAM HCL 5 MG/5ML IJ SOLN
INTRAMUSCULAR | Status: DC | PRN
  Administered 2024-04-11: 2 mg via INTRAVENOUS

## 2024-04-11 MED ORDER — CHLORHEXIDINE GLUCONATE CLOTH 2 % EX PADS
6.0000 | MEDICATED_PAD | Freq: Once | CUTANEOUS | Status: AC
  Administered 2024-04-11: 6 via TOPICAL

## 2024-04-11 MED ORDER — HYDROMORPHONE HCL 1 MG/ML IJ SOLN
0.5000 mg | INTRAMUSCULAR | Status: DC | PRN
  Administered 2024-04-11 (×2): 0.5 mg via INTRAVENOUS
  Filled 2024-04-11 (×2): qty 0.5

## 2024-04-11 MED ORDER — ACETAMINOPHEN 500 MG PO TABS: 1000.0000 mg | ORAL_TABLET | Freq: Four times a day (QID) | ORAL | 0 refills | Status: AC

## 2024-04-11 MED ORDER — FENTANYL CITRATE PF 50 MCG/ML IJ SOSY
PREFILLED_SYRINGE | INTRAMUSCULAR | Status: AC
  Filled 2024-04-11: qty 1

## 2024-04-11 MED ORDER — PHENYLEPHRINE 80 MCG/ML (10ML) SYRINGE FOR IV PUSH (FOR BLOOD PRESSURE SUPPORT)
PREFILLED_SYRINGE | INTRAVENOUS | Status: DC | PRN
  Administered 2024-04-11: 80 ug via INTRAVENOUS

## 2024-04-11 MED ORDER — ACETAMINOPHEN 10 MG/ML IV SOLN
INTRAVENOUS | Status: AC
  Filled 2024-04-11: qty 100

## 2024-04-11 MED ORDER — LIDOCAINE 2% (20 MG/ML) 5 ML SYRINGE
INTRAMUSCULAR | Status: DC | PRN
Start: 2024-04-11 — End: 2024-04-11
  Administered 2024-04-11: 80 mg via INTRAVENOUS

## 2024-04-11 MED ORDER — EPHEDRINE SULFATE-NACL 50-0.9 MG/10ML-% IV SOSY
PREFILLED_SYRINGE | INTRAVENOUS | Status: DC | PRN
Start: 2024-04-11 — End: 2024-04-11
  Administered 2024-04-11 (×2): 10 mg via INTRAVENOUS

## 2024-04-11 MED ORDER — GLYCOPYRROLATE PF 0.2 MG/ML IJ SOSY
PREFILLED_SYRINGE | INTRAMUSCULAR | Status: DC | PRN
  Administered 2024-04-11: .2 mg via INTRAVENOUS

## 2024-04-11 MED ORDER — BUPIVACAINE HCL (PF) 0.5 % IJ SOLN
INTRAMUSCULAR | Status: DC | PRN
  Administered 2024-04-11: 30 mL

## 2024-04-11 MED ORDER — PROPOFOL 10 MG/ML IV BOLUS
INTRAVENOUS | Status: DC | PRN
  Administered 2024-04-11: 200 mg via INTRAVENOUS

## 2024-04-11 MED ORDER — ONDANSETRON HCL 4 MG PO TABS: 4.0000 mg | ORAL_TABLET | Freq: Every day | ORAL | 1 refills | Status: DC | PRN

## 2024-04-11 MED ORDER — MIDAZOLAM HCL 2 MG/2ML IJ SOLN
INTRAMUSCULAR | Status: AC
  Filled 2024-04-11: qty 2

## 2024-04-11 MED ORDER — CHLORHEXIDINE GLUCONATE 0.12 % MT SOLN
OROMUCOSAL | Status: AC
  Filled 2024-04-11: qty 15

## 2024-04-11 MED ORDER — OXYCODONE HCL 5 MG/5ML PO SOLN: 5.0000 mg | Freq: Once | ORAL | Status: AC | PRN

## 2024-04-11 MED ORDER — BUPIVACAINE HCL (PF) 0.5 % IJ SOLN
INTRAMUSCULAR | Status: AC
  Filled 2024-04-11: qty 30

## 2024-04-11 MED ORDER — ROCURONIUM BROMIDE 100 MG/10ML IV SOLN
INTRAVENOUS | Status: DC | PRN
  Administered 2024-04-11: 50 mg via INTRAVENOUS
  Administered 2024-04-11: 20 mg via INTRAVENOUS
  Administered 2024-04-11: 10 mg via INTRAVENOUS

## 2024-04-11 MED ORDER — LACTATED RINGERS IV SOLN: INTRAVENOUS | Status: DC

## 2024-04-11 MED ORDER — STERILE WATER FOR IRRIGATION IR SOLN
Status: DC | PRN
  Administered 2024-04-11: 500 mL

## 2024-04-11 MED ORDER — ACETAMINOPHEN 10 MG/ML IV SOLN
1000.0000 mg | Freq: Four times a day (QID) | INTRAVENOUS | Status: DC
  Administered 2024-04-11: 1000 mg via INTRAVENOUS

## 2024-04-11 MED ORDER — FENTANYL CITRATE (PF) 250 MCG/5ML IJ SOLN
INTRAMUSCULAR | Status: AC
  Filled 2024-04-11: qty 5

## 2024-04-11 MED ORDER — OXYCODONE HCL 5 MG PO TABS: 5.0000 mg | ORAL_TABLET | Freq: Four times a day (QID) | ORAL | 0 refills | Status: DC | PRN

## 2024-04-11 MED ORDER — DEXAMETHASONE SODIUM PHOSPHATE 10 MG/ML IJ SOLN
INTRAMUSCULAR | Status: DC | PRN
Start: 2024-04-11 — End: 2024-04-11
  Administered 2024-04-11: 10 mg via INTRAVENOUS

## 2024-04-11 MED ORDER — LABETALOL HCL 5 MG/ML IV SOLN
5.0000 mg | INTRAVENOUS | Status: DC | PRN
  Administered 2024-04-11: 5 mg via INTRAVENOUS

## 2024-04-11 MED ORDER — CHLORHEXIDINE GLUCONATE 0.12 % MT SOLN
15.0000 mL | Freq: Once | OROMUCOSAL | Status: AC
  Administered 2024-04-11: 15 mL via OROMUCOSAL

## 2024-04-11 MED ORDER — OXYCODONE HCL 5 MG PO TABS
ORAL_TABLET | ORAL | Status: AC
  Filled 2024-04-11: qty 1

## 2024-04-11 MED ORDER — ONDANSETRON HCL 4 MG/2ML IJ SOLN: 4.0000 mg | Freq: Once | INTRAMUSCULAR | Status: DC | PRN

## 2024-04-11 MED ORDER — FENTANYL CITRATE (PF) 100 MCG/2ML IJ SOLN
INTRAMUSCULAR | Status: DC | PRN
Start: 2024-04-11 — End: 2024-04-11
  Administered 2024-04-11 (×2): 50 ug via INTRAVENOUS
  Administered 2024-04-11: 100 ug via INTRAVENOUS
  Administered 2024-04-11: 50 ug via INTRAVENOUS

## 2024-04-11 MED ORDER — FENTANYL CITRATE PF 50 MCG/ML IJ SOSY
25.0000 ug | PREFILLED_SYRINGE | INTRAMUSCULAR | Status: DC | PRN
  Administered 2024-04-11 (×3): 50 ug via INTRAVENOUS
  Filled 2024-04-11 (×2): qty 1

## 2024-04-11 MED ORDER — CEFAZOLIN SODIUM-DEXTROSE 2-4 GM/100ML-% IV SOLN
2.0000 g | INTRAVENOUS | Status: AC
  Administered 2024-04-11: 2 g via INTRAVENOUS
  Filled 2024-04-11: qty 100

## 2024-04-11 MED ORDER — MIDAZOLAM HCL 2 MG/2ML IJ SOLN
1.0000 mg | INTRAMUSCULAR | Status: DC | PRN
  Administered 2024-04-11: 1 mg via INTRAVENOUS

## 2024-04-11 MED ORDER — ORAL CARE MOUTH RINSE: 15.0000 mL | Freq: Once | OROMUCOSAL | Status: AC

## 2024-04-11 MED ORDER — ONDANSETRON HCL 4 MG/2ML IJ SOLN
INTRAMUSCULAR | Status: DC | PRN
Start: 2024-04-11 — End: 2024-04-11
  Administered 2024-04-11: 4 mg via INTRAVENOUS

## 2024-04-11 SURGICAL SUPPLY — 43 items
BLADE SURG 15 STRL LF DISP TIS (BLADE) ×1 IMPLANT
CHLORAPREP W/TINT 26 (MISCELLANEOUS) ×1 IMPLANT
COVER LIGHT HANDLE STERIS (MISCELLANEOUS) ×2 IMPLANT
COVER MAYO STAND XLG (MISCELLANEOUS) ×1 IMPLANT
COVER TIP SHEARS 8 DVNC (MISCELLANEOUS) ×1 IMPLANT
DEFOGGER SCOPE WARMER CLEARIFY (MISCELLANEOUS) ×1 IMPLANT
DERMABOND ADVANCED .7 DNX12 (GAUZE/BANDAGES/DRESSINGS) ×1 IMPLANT
DRAPE ARM DVNC X/XI (DISPOSABLE) ×3 IMPLANT
DRAPE COLUMN DVNC XI (DISPOSABLE) ×1 IMPLANT
DRIVER NDL MEGA SUTCUT DVNCXI (INSTRUMENTS) ×1 IMPLANT
DRIVER NDLE MEGA SUTCUT DVNCXI (INSTRUMENTS) ×1 IMPLANT
DRSG TEGADERM 4X4.75 (GAUZE/BANDAGES/DRESSINGS) ×1 IMPLANT
ELECTRODE REM PT RTRN 9FT ADLT (ELECTROSURGICAL) ×1 IMPLANT
FORCEPS BPLR 8 MD DVNC XI (FORCEP) ×1 IMPLANT
GAUZE SPONGE 2X2 8PLY STRL LF (GAUZE/BANDAGES/DRESSINGS) ×2 IMPLANT
GLOVE BIOGEL PI IND STRL 6.5 (GLOVE) ×2 IMPLANT
GLOVE BIOGEL PI IND STRL 7.0 (GLOVE) ×2 IMPLANT
GLOVE SURG SS PI 6.5 STRL IVOR (GLOVE) ×2 IMPLANT
GOWN STRL REUS W/TWL LRG LVL3 (GOWN DISPOSABLE) ×3 IMPLANT
GRASPER SUT TROCAR 14GX15 (MISCELLANEOUS) IMPLANT
KIT TURNOVER KIT A (KITS) ×1 IMPLANT
MESH VENTRALIGHT ST 4.5IN (Mesh General) IMPLANT
NDL HYPO 21X1.5 SAFETY (NEEDLE) ×1 IMPLANT
NDL INSUFFLATION 14GA 120MM (NEEDLE) ×1 IMPLANT
NEEDLE HYPO 21X1.5 SAFETY (NEEDLE) ×1 IMPLANT
NEEDLE INSUFFLATION 14GA 120MM (NEEDLE) ×1 IMPLANT
OBTURATOR OPTICALSTD 8 DVNC (TROCAR) ×1 IMPLANT
PACK LAP CHOLE LZT030E (CUSTOM PROCEDURE TRAY) ×1 IMPLANT
PENCIL HANDSWITCHING (ELECTRODE) ×1 IMPLANT
POSITIONER HEAD 8X9X4 ADT (SOFTGOODS) ×1 IMPLANT
SCISSORS MNPLR CVD DVNC XI (INSTRUMENTS) ×1 IMPLANT
SEAL UNIV 5-12 XI (MISCELLANEOUS) ×3 IMPLANT
SET BASIN LINEN APH (SET/KITS/TRAYS/PACK) ×1 IMPLANT
SET TUBE SMOKE EVAC HIGH FLOW (TUBING) ×1 IMPLANT
SUT MNCRL AB 4-0 PS2 18 (SUTURE) ×1 IMPLANT
SUT STRATA 3-0 SH (SUTURE) ×4 IMPLANT
SUT VICRYL 0 UR6 27IN ABS (SUTURE) IMPLANT
SUTURE STRATFX 0 PDS+ CT-2 23 (SUTURE) ×1 IMPLANT
SYR 30ML LL (SYRINGE) ×1 IMPLANT
SYSTEM RETRIEVL 5MM INZII UNIV (BASKET) IMPLANT
TAPE TRANSPORE STRL 2 31045 (GAUZE/BANDAGES/DRESSINGS) ×1 IMPLANT
TRAY FOLEY W/BAG SLVR 16FR ST (SET/KITS/TRAYS/PACK) ×1 IMPLANT
WATER STERILE IRR 500ML POUR (IV SOLUTION) ×1 IMPLANT

## 2024-04-11 NOTE — Discharge Instructions (Addendum)
 Ambulatory Surgery Discharge Instructions  General Anesthesia or Sedation Do not drive or operate heavy machinery for 24 hours.  Do not consume alcohol, tranquilizers, sleeping medications, or any non-prescribed medications for 24 hours. Do not make important decisions or sign any important papers in the next 24 hours. You should have someone with you tonight at home.  Activity  You are advised to go directly home from the hospital.  Restrict your activities and rest for a day.  Resume light activity tomorrow. No heavy lifting over 10 lbs or strenuous exercise.  Wear your abdominal binder at all times except while bathing.  Fluids and Diet Begin with clear liquids, bouillon, dry toast, soda crackers.  If not nauseated, you may go to a regular diet when you desire.  Greasy and spicy foods are not advised.  Medications  If you have not had a bowel movement in 24 hours, take 2 tablespoons over the counter Milk of mag.             You May resume your blood thinners tomorrow (Aspirin, coumadin, or other).  You are being discharged with prescriptions for Opioid/Narcotic Medications: There are some specific considerations for these medications that you should know. Opioid Meds have risks & benefits. Addiction to these meds is always a concern with prolonged use Take medication only as directed Do not drive while taking narcotic pain medication Do not crush tablets or capsules Do not use a different container than medication was dispensed in Lock the container of medication in a cool, dry place out of reach of children and pets. Opioid medication can cause addiction Do not share with anyone else (this is a felony) Do not store medications for future use. Dispose of them properly.     Disposal:  Find a [employer]  household drug take back site near you.  If you can't get to a drug take back site, use the recipe below as a last resort to dispose of expired, unused or unwanted drugs. Disposal   (Do not dispose chemotherapy drugs this way, talk to your prescribing doctor instead.) Step 1: Mix drugs (do not crush) with dirt, kitty litter, or used coffee grounds and add a small amount of water to dissolve any solid medications. Step 2: Seal drugs in plastic bag. Step 3: Place plastic bag in trash. Step 4: Take prescription container and scratch out personal information, then recycle or throw away.  Operative Site  You have a liquid bandage over your incisions, this will begin to flake off in about a week. Ok to English as a second language teacher. Keep wound clean and dry. No baths or swimming. No lifting more than 10 pounds.  Contact Information: If you have questions or concerns, please call our office, [phone], Monday- Thursday 8AM-5PM and Friday 8AM-12Noon.  If it is after hours or on the weekend, please call [facility] Main Number, [phone], and ask to speak to the surgeon on call for Dr. Cherilyn Corn at [facility].   SPECIFIC COMPLICATIONS TO WATCH FOR: Inability to urinate Fever over 101? F by mouth Nausea and vomiting lasting longer than 24 hours. Pain not relieved by medication ordered Swelling around the operative site Increased redness, warmth, hardness, around operative area Numbness, tingling, or cold fingers or toes Blood -soaked dressing, (small amounts of oozing may be normal) Increasing and progressive drainage from surgical area or exam site  Post Anesthesia Home Care Instructions  Activity: Get plenty of rest for the remainder of the day. A responsible individual must stay with you  for 24 hours following the procedure.  For the next 24 hours, DO NOT: -Drive a car -Advertising copywriter -Drink alcoholic beverages -Take any medication unless instructed by your physician -Make any legal decisions or sign important papers.  Meals: Start with liquid foods such as gelatin or soup. Progress to regular foods as tolerated. Avoid greasy, spicy, heavy foods. If nausea and/or vomiting  occur, drink only clear liquids until the nausea and/or vomiting subsides. Call your physician if vomiting continues.  Special Instructions/Symptoms: Your throat may feel dry or sore from the anesthesia or the breathing tube placed in your throat during surgery. If this causes discomfort, gargle with warm salt water. The discomfort should disappear within 24 hours.  If you had a scopolamine patch placed behind your ear for the management of post- operative nausea and/or vomiting:

## 2024-04-11 NOTE — Progress Notes (Signed)
 [facility]  Left a voicemail for the patient's husband.  I explained that she tolerated the procedure without difficulty.  She has dissolvable stitches under the skin with overlying skin glue.  This will flake off in 10 to 14 days.    Preoperatively, I discussed that she would be discharged home with a prescription for narcotic pain medication that they should take as needed for pain.  I also want her taking scheduled Tylenol .  If they take the narcotic pain medication, they should take a stool softener as well.  The patient will follow-up with me in 2 weeks.    Lidia Reels, DO [facility] [address] [place], [place] [address] [phone] (office)

## 2024-04-11 NOTE — Anesthesia Procedure Notes (Signed)
 Procedure Name: Intubation Date/Time: 04/11/2024 10:10 AM  Performed by: Koreen Person, CRNAPre-anesthesia Checklist: Patient identified, Suction available, Emergency Drugs available, Patient being monitored and Timeout performed Patient Re-evaluated:Patient Re-evaluated prior to induction Oxygen Delivery Method: Circle system utilized Preoxygenation: Pre-oxygenation with 100% oxygen Induction Type: IV induction Laryngoscope Size: Glidescope and 3 Grade View: Grade I Tube type: Oral Tube size: 7.0 mm Number of attempts: 1 Airway Equipment and Method: Stylet Placement Confirmation: ETT inserted through vocal cords under direct vision, positive ETCO2, CO2 detector and breath sounds checked- equal and bilateral Secured at: 22 cm Tube secured with: Tape Dental Injury: Teeth and Oropharynx as per pre-operative assessment  Comments: Head and neck in neutral position throughout intubation. RSI due to patient stating uncontrolled GERD.

## 2024-04-11 NOTE — Anesthesia Preprocedure Evaluation (Signed)
 Anesthesia Evaluation  Patient identified by MRN, date of birth, ID band Patient awake    Reviewed: Allergy & Precautions, H&P , NPO status , Patient's Chart, lab work & pertinent test results, reviewed documented beta blocker date and time   History of Anesthesia Complications (+) history of anesthetic complications  Airway Mallampati: II  TM Distance: >3 FB Neck ROM: full    Dental no notable dental hx.    Pulmonary neg pulmonary ROS, former smoker   Pulmonary exam normal breath sounds clear to auscultation       Cardiovascular Exercise Tolerance: Good hypertension, negative cardio ROS  Rhythm:regular Rate:Normal     Neuro/Psych  Headaches PSYCHIATRIC DISORDERS Anxiety Depression       GI/Hepatic Neg liver ROS,GERD  ,,  Endo/Other  negative endocrine ROS    Renal/GU negative Renal ROS  negative genitourinary   Musculoskeletal   Abdominal   Peds  Hematology negative hematology ROS (+)   Anesthesia Other Findings   Reproductive/Obstetrics negative OB ROS                             Anesthesia Physical Anesthesia Plan  ASA: 2  Anesthesia Plan: General and General ETT   Post-op Pain Management:    Induction:   PONV Risk Score and Plan: Ondansetron   Airway Management Planned:   Additional Equipment:   Intra-op Plan:   Post-operative Plan:   Informed Consent: I have reviewed the patients History and Physical, chart, labs and discussed the procedure including the risks, benefits and alternatives for the proposed anesthesia with the patient or authorized representative who has indicated his/her understanding and acceptance.     Dental Advisory Given  Plan Discussed with: CRNA  Anesthesia Plan Comments:        Anesthesia Quick Evaluation

## 2024-04-11 NOTE — Interval H&P Note (Signed)
 History and Physical Interval Note:  04/11/2024 9:06 AM  Ann [name]  has presented today for surgery, with the diagnosis of HERNIA, UMBILICAL 1.5CM.  The various methods of treatment have been discussed with the patient and family. After consideration of risks, benefits and other options for treatment, the patient has consented to  Procedure(s) with comments: REPAIR, HERNIA, UMBILICAL, ROBOT-ASSISTED (N/A) - W/ MESH as a surgical intervention.  The patient's history has been reviewed, patient examined, no change in status, stable for surgery.  I have reviewed the patient's chart and labs.  Questions were answered to the patient's satisfaction.     Salvatore Shear A Takyah Ciaramitaro

## 2024-04-11 NOTE — Op Note (Signed)
 [facility] Operative Note  04/11/24  Preoperative Diagnosis: Umbilical hernia, 1.5 cm   Postoperative Diagnosis: Umbilical hernia, 1.5 cm; supraumbilical hernia, 2 cm   Procedure(s) Performed: Robotic assisted laparoscopic umbilical hernia and supraumbilical hernia repairs with mesh, total defect size 3.5 cm (1.5 cm umbilical hernia, 2 cm supraumbilical hernia)   Surgeon: Lidia Reels, DO   Assistants: No qualified resident was available    Anesthesia: General endotracheal   Anesthesiologist: Coretha Dew, MD    Specimens: None   Estimated Blood Loss: Minimal   Blood Replacement: None    Complications: None   Wound Class:Clean   Operative Indications: Patient is a 58 year old female who presents for umbilical hernia repair.  She has been having increased pain at her hernia, and she would like it repaired at this time.  She is agreeable to surgery.  All risks and benefits of performing this procedure were discussed with the patient including pain, infection, bleeding, damage to the surrounding structures, and need for more procedures or surgery. The patient voiced understanding of the procedure, all questions were sought and answered, and consent was obtained.  Findings: 1.5 cm umbilical hernia, 2 cm supraumbilical hernia, total defect size of 3.5 cm Tension free repair achieved with Ventralight 4.5 inch mesh and suture Adequate hemostasis    Procedure: The patient was brought to the operating room and general anesthesia was induced. A time-out was completed verifying correct patient, procedure, site, positioning, and implant(s) and/or special equipment prior to beginning this procedure. Antibiotics were administered prior to making the incision. SCDs placed. The anterior abdominal wall was prepped and draped in the standard sterile fashion.   A stab incision was made at Palmer's point. A towel clip was used to elevate the abdominal wall. A Veress  needle placed and the saline drop test was performed. The abdomen was insufflated to 15cm without issues. 8 mm port was then placed at the Palmer's point incision using the optiview technique.  No injuries were noted from insufflation and port insertion.  2 additional lateral ports on the left side were placed using the optiview ports, placing the 8mm central camera port and an additional 8mm port on in the left lower quadrant with adequate space between.  The Schwana robot then docked into place.  The hernia was noted to be small at the previous umbilical incision site. The hernia contents noted and reduced with combination of blunt, sharp dissection with scissors and fenestrated forceps.  Hemostasis achieved throughout this portion.  Once all hernia contents reduced, there was noted to be a 1.5 cm umbilical hernia defect in addition to a 2 cm supraumbilical hernia defect.  Any fat overlying the peritoneal layer was taken down creating flaps to clear the space for adequate fixation of the mesh.   The Ventralight 4.5 inch mesh and sutures were placed in the abdominal cavity under direct visualization.  A transfacial suture with 0 stratafix used to primarily close defects under minimal tension. The stratafix was then passed through the center of the mesh (coated side out) to secured the mesh to the abdominal wall centered over the defect.  The mesh was then circumferentially sutured into the anterior abdominal wall using 3-0 stratafix x3.  Any bleeding noted during this portion was no longer actively bleeding by end of securing mesh and tightening the suture.  Fat contained within the supraumbilical hernia defect was excised from the anterior abdominal wall and removed through the Palmer's point incision.  The fascia of  this incision site was closed with 0 Vicryl with a PMI needle.  The robot was undocked.  The abdomen was then de-sufflated and the ports were removed.  All skin incisions closed with subcuticular 4-0  Monocryl and dermabond. Final inspection revealed acceptable hemostasis.   All counts were correct at the end of the case. The patient was awakened from anesthesia and extubated without complication.  The patient went to the PACU in stable condition.   Lidia Reels, DO [facility] [address] [place], [place] [address] [phone] (office)

## 2024-04-11 NOTE — Transfer of Care (Signed)
 Immediate Anesthesia Transfer of Care Note  Patient: Ann [name]  Procedure(s) Performed: REPAIR, HERNIA, UMBILICAL AND SUPRAUMBILICAL, ROBOT-ASSISTED (Abdomen)  Patient Location: PACU  Anesthesia Type:General  Level of Consciousness: awake, alert , and oriented  Airway & Oxygen Therapy: Patient Spontanous Breathing and Patient connected to face mask oxygen  Post-op Assessment: Report given to RN and Post -op Vital signs reviewed and stable  Post vital signs: Reviewed and stable  Last Vitals:  Vitals Value Taken Time  BP 169/87 04/11/24 1214  Temp    Pulse 90 04/11/24 1215  Resp 19 04/11/24 1215  SpO2 100 % 04/11/24 1215  Vitals shown include unfiled device data.  Last Pain:  Vitals:   04/11/24 0756  PainSc: 3       Patients Stated Pain Goal: 2 (04/11/24 0756)  Complications: No notable events documented.

## 2024-04-12 ENCOUNTER — Ambulatory Visit (HOSPITAL_BASED_OUTPATIENT_CLINIC_OR_DEPARTMENT_OTHER): Payer: Self-pay | Admitting: Internal Medicine

## 2024-04-12 ENCOUNTER — Encounter (HOSPITAL_BASED_OUTPATIENT_CLINIC_OR_DEPARTMENT_OTHER): Payer: Self-pay | Admitting: Internal Medicine

## 2024-04-12 ENCOUNTER — Other Ambulatory Visit: Payer: Self-pay

## 2024-04-12 VITALS — BP 122/70 | HR 73 | Ht 66.0 in | Wt 201.6 lb

## 2024-04-12 DIAGNOSIS — F17210 Nicotine dependence, cigarettes, uncomplicated: Secondary | ICD-10-CM

## 2024-04-12 DIAGNOSIS — I10 Essential (primary) hypertension: Secondary | ICD-10-CM

## 2024-04-12 DIAGNOSIS — Z951 Presence of aortocoronary bypass graft: Secondary | ICD-10-CM

## 2024-04-12 DIAGNOSIS — I2510 Atherosclerotic heart disease of native coronary artery without angina pectoris: Secondary | ICD-10-CM

## 2024-04-12 DIAGNOSIS — E785 Hyperlipidemia, unspecified: Secondary | ICD-10-CM

## 2024-04-12 NOTE — Anesthesia Postprocedure Evaluation (Signed)
 Anesthesia Post Note  Patient: Ann [name]  Procedure(s) Performed: REPAIR, HERNIA, UMBILICAL AND SUPRAUMBILICAL, ROBOT-ASSISTED (Abdomen)  Patient location during evaluation: Phase II Anesthesia Type: General Level of consciousness: awake Pain management: pain level controlled Vital Signs Assessment: post-procedure vital signs reviewed and stable Respiratory status: spontaneous breathing and respiratory function stable Cardiovascular status: blood pressure returned to baseline and stable Postop Assessment: no headache and no apparent nausea or vomiting Anesthetic complications: no Comments: Late entry   No notable events documented.   Last Vitals:  Vitals:   04/11/24 1345 04/11/24 1401  BP: (!) 125/49 135/66  Pulse: 85 97  Resp: 16 16  Temp:  37 C  SpO2:  94%    Last Pain:  Vitals:   04/11/24 1406  TempSrc:   PainSc: 9                  Coretha Dew

## 2024-04-12 NOTE — Progress Notes (Signed)
 Cardiology Clinic Follow Up Note    Name:  Monique [name]   DOB: 1966-11-21   MRN: [id number]   Date:  04/12/2024     PCP: Kendra Pavy, MD     History  Monique [name] is a 58 y.o. female. with coronary artery disease who presents today for follow-up visit.  The patient had coronary artery bypass surgery in 2012.  She has longstanding tobacco use and poorly controlled diabetes mellitus but states that with the initiation of Ozempic  blood sugars have been better controlled and her hemoglobin A1c is trending downward.  Earlier this year the patient was admitted with chest pain and was found to have metabolic acidosis.  With IV fluids her chest pain resolved and she ruled out for myocardial infarction.  Her main complaint today is noncardiac in nature.  She states that at times her left leg just tremble and then her knee gives out on her.  She reports she had an MRI at another facility although I do not have access to these results but from her understanding no abnormalities were identified.  She continues to take care of her mother-in-law who was 9 years old and she states that she is fatigued but does not have any chest discomfort.  Unfortunately she has not been successful in smoking cessation.     History is also known for chronic occlusion of the left internal carotid artery with carotid duplex several months ago demonstrating less than 50% stenosis of the right ICA    1. The left ventricle is small. Normal left ventricular ejection fraction. LV Ejection Fraction is 60-65 %.  Concentric remodeling.  2. Resting Segmental Wall Motion Analysis: Total wall motion score is 1.00. There are no regional wall  motion abnormalities.  3. Trace aortic valve regurgitation. No Aortic Valve Stenosis.  Last testing:   Latest Reference Range & Units 01/30/23 08:23 05/06/23 14:44 03/03/24 10:54   CHOLESTEROL 100 - 200 mg/dL 454 (H) 098 (H) 119   HDL-CHOLESTEROL >=50 mg/dL 44 (L) 40 (L) 50   LDL (CALCULATED) <100 mg/dL 147  (H) 829 (H) 77   TRIGLYCERIDES <150 mg/dL 562 130 (H) 865   (H): Data is abnormally high  (L): Data is abnormally low   Latest Reference Range & Units 08/11/23 12:38 03/03/24 10:54   HEMOGLOBIN A1C <5.7 % 8.9 (H) 7.8 (H)   (H): Data is abnormally high  Past Medical History:   Diagnosis Date    Anxiety     Arthritis     Back problem     DDD    Beta blocker prescribed for left ventricular systolic dysfunction     Blood thinned due to long-term anticoagulant use     Carotid stenosis     occlusion of left ICA    Chest pain 04/03/2018    Chronic back pain     Chronic obstructive pulmonary disease, unspecified COPD type (CMS HCC) 12/31/2022    Coronary artery disease involving native coronary artery of native heart     CVA (cerebral vascular accident)     DDD (degenerative disc disease), lumbar     "entire spine"    Ear piercing     GERD (gastroesophageal reflux disease)     controlled with omeprazole     H/O complete eye exam     2 years, Dr. Amaryllis Junior, at [facility]    History of dental examination     dentures upper and lower    HTN     Hx of  coronary artery bypass graft     2012    Hx of gastric bypass     Hypercholesterolemia     Hyperlipidemia     Hypothyroid     MI (myocardial infarction) 2012    Neck problem     herniated cervical disc    Obesity (BMI 30-39.9)     Peripheral vascular disease, unspecified (CMS HCC) 01/03/2021    Solitary nodule of right lobe of thyroid  03/30/2018    Incidental finding on MRI thoracic spine. US  ordered.     Stented coronary artery     x4    Stroke     2002 and again 2025    Stroke 01/05/2024    treated at [facility] in [place], [place]     Tattoo     Left breast, Right shoulder    Thyroid  disorder     Thyroid  nodule     Type 2 diabetes mellitus     HGA1C 10.4 on 12/19/21    Uncontrolled type 2 diabetes mellitus, without long-term current use of insulin      Xanthelasma            Patient Active Problem List    Diagnosis Date Noted    Moderate episode of recurrent major depressive disorder  (CMS HCC) 01/05/2024    Closed nondisplaced fracture of distal phalanx of left little finger 10/29/2023    Tobacco use disorder 02/18/2023    Weakness 02/16/2023    Atherosclerosis of native artery of right lower extremity with rest pain (CMS HCC) 12/31/2022    Chronic obstructive pulmonary disease, unspecified COPD type (CMS HCC) 12/31/2022    Unspecified inflammatory spondylopathy, lumbar region (CMS HCC) 07/17/2022    Other specified disorders of adrenal gland 07/17/2022    Left leg weakness 05/15/2021    Dizziness 05/15/2021    Chronic lumbar radiculopathy 05/15/2021    Hypertension associated with type 2 diabetes mellitus (CMS HCC)  (CMS HCC) 05/15/2021    Type 2 diabetes mellitus with complication, without long-term current use of insulin  (CMS HCC) 05/15/2021    Major depressive disorder, recurrent, unspecified 01/03/2021    Peripheral vascular disease, unspecified (CMS HCC) 01/03/2021    Fracture of phalanx of finger of right hand 12/05/2019    Spinal pain 12/05/2019    Type 2 diabetes mellitus with hyperglycemia 12/05/2019    Upper respiratory infection 12/05/2019    Thyroid  disorder 09/16/2019    UDS: 07/28/19 08/02/2019    Sinus congestion 12/07/2018    Morbid obesity (CMS HCC) 09/13/2018    Type 2 diabetes mellitus with hyperosmolarity without coma, without long-term current use of insulin  (CMS HCC)     Coronary artery disease involving native coronary artery of native heart     GERD (gastroesophageal reflux disease)     HTN (hypertension)     Hyperlipidemia     Constipation 04/15/2018    Thyroid  nodule 04/15/2018    Chest pain 04/03/2018    DDD (degenerative disc disease), lumbar      "entire spine"      Anxiety     Chronic back pain     Carotid stenosis     BMI 32.0-32.9,adult     H/O complete eye exam     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 Deetta Farrow, [facility]    HX CORONARY STENT PLACEMENT  2012    x4 stents    HX GASTRIC SLEEVE  02/2017    [facility], [place], Dr. Bolivar Bushman HEART CATHETERIZATION  2012    massive heart attack and stents - [facility]    HX THYROID  BIOPSY      HX THYROIDECTOMY  101/01/2018    HX TONSILLECTOMY      as child    HX YAG Left 09/20/2013            Current Outpatient Medications   Medication Sig    aspirin  (ECOTRIN) 81 mg Oral Tablet, Delayed Release (E.C.) Take 1 Tablet (81 mg total) by mouth Daily    BD PRECISIONGLIDE 25 gauge x 1" Needle USE TO INJECT EVERY 30 DAYS    Blood Sugar Diagnostic (ACCU-CHEK GUIDE TEST STRIPS) Does not apply Strip TEST BLOOD SUGAR THREE TIMES DAILY    Blood-Glucose Meter (ACCU-CHEK GUIDE GLUCOSE METER) Misc USE AS DIRECTED    clopidogreL  (PLAVIX ) 75 mg Oral Tablet TAKE 1 TABLET EVERY DAY    cyanocobalamin  (VITAMIN B12) 1,000 mcg/mL Injection Solution INJECT 1 ML (1,000 MCG TOTAL) UNDER THE SKIN EVERY 30 DAYS    docusate sodium  (COLACE) 100 mg Oral Capsule TAKE 1 CAPSULE TWICE DAILY    empagliflozin  (JARDIANCE ) 10 mg Oral Tablet Take 1 Tablet (10 mg total) by mouth Once a day    ergocalciferol , vitamin D2, (DRISDOL ) 1,250 mcg (50,000 unit) Oral Capsule TAKE 1 CAPSULE BY MOUTH EVERY WEEK    ezetimibe  (ZETIA ) 10 mg Oral Tablet TAKE ONE TABLET BY MOUTH EVERY EVENING    fluticasone  propionate (FLONASE ) 50 mcg/actuation Nasal Spray, Suspension Administer 2 Sprays into each nostril Once a day    HYDROcodone -acetaminophen  (NORCO) 10-325 mg Oral Tablet Take 1 Tablet by mouth Every 8 hours as needed for Pain Babette Bolognese with [facility]    Ibuprofen  (MOTRIN ) 800 mg Oral Tablet TAKE ONE TABLET BY MOUTH THREE TIMES DAILY AS NEEDED  FOR PAIN WITH FOOD    Lancets (ACCU-CHEK SOFTCLIX LANCETS) Misc USE AS DIRECTED DAILY    levothyroxine  (SYNTHROID ) 25 mcg Oral Tablet TAKE 1 TABLET ONE TIME DAILY    metoprolol  succinate (TOPROL -XL) 25 mg Oral Tablet Sustained Release 24 hr Take 0.5 Tablets (12.5 mg total) by mouth Once a day    NARCAN  4 mg/actuation Nasal Spray, Non-Aerosol PT STATED SHE HAS NARCAN  AT HOME BUT IS NOT TAKING IT    niacin  (NIASPAN ) 500 mg Oral Tablet Sustained Release 24 hr TAKE 1 TABLET EVERY DAY    nitroGLYCERIN  (NITROSTAT ) 0.4 mg Sublingual Tablet, Sublingual Place 1 Tablet (0.4 mg total) under the tongue Every 5 minutes as needed for Chest pain for 3 doses over 15 minutes    omeprazole  (PRILOSEC) 40 mg Oral Capsule, Delayed Release(E.C.) TAKE 1 CAPSULE EVERY DAY    ondansetron  (ZOFRAN  ODT) 4 mg Oral Tablet, Rapid Dissolve Take 1 Tablet (4 mg total) by mouth Every 8 hours as needed for Nausea/Vomiting    ondansetron  (ZOFRAN  ODT) 8 mg Oral Tablet, Rapid Dissolve DISSOLVE 1 TABLET ON THE TONGUE EVERY  8 HOURS AS NEEDED FOR NAUSEA AND VOMITING    OZEMPIC  2 mg/dose (8 mg/3 mL) Subcutaneous Pen Injector Inject 2 mg under the skin Every 7 days    pioglitazone  (ACTOS ) 15 mg Oral Tablet Take 1 Tablet (15 mg total) by mouth Daily    PRENATAL VITAMIN PLUS LOW IRON  27 mg iron - 1 mg Oral Tablet TAKE 1 TABLET EVERY DAY    venlafaxine  (EFFEXOR  XR) 37.5 mg Oral Capsule, Sust. Release 24 hr TAKE 1 CAPSULE ONE TIME DAILY    ZYRTEC -D 5-120 mg Oral Tablet Sustained Release 12 hr Take 1 Tablet by mouth Once a day        Allergies   Allergen Reactions    Capron Dm [Pyrilamine -Dextromethorphan ]  Other Adverse Reaction (Add comment)     LIGHT HEADED    Other      Coban dressing caused skin break down    Tylenol  W Codeine  [Acetaminophen -Codeine ]  Other Adverse Reaction (Add comment)     pt states that one time she had chest pains with this med, but dr. thought it was because she had not eaten       Family Medical History:       Problem Relation (Age of  Onset)    Atrial fibrillation Sister    Colon Cancer Father    Diabetes Mother, Maternal Aunt    Heart Disease Sister, Maternal Grandfather    Hypertension (High Blood Pressure) Mother    Lung Cancer Maternal Grandfather    MI <40 years of age Mother              Social History     Tobacco Use    Smoking status: Every Day     Average packs/day: 0.5 packs/day for 44.0 years (21.8 ttl pk-yrs)     Types: Cigarettes     Start date: 63    Smokeless tobacco: Never   Substance Use Topics    Alcohol use: No       REVIEW OF SYSTEMS:  10 system review is negative other than that stated in the HPI  Physical Exam  BP 122/70 (Site: Left Arm, Patient Position: Sitting)   Pulse 73   Ht 1.676 m (5\' 6" )   Wt 91.4 kg (201 lb 9.6 oz)   LMP  (LMP Unknown)   SpO2 96%   BMI 32.54 kg/m       GEN:  Pleasant, conversant and in no acute distress  HEENT: Pupils equal and reactive.  Sclera nonicteric  Neck: No JVD, thyromegaly or bruit  Lungs: Clear bilaterally  CV:  Regular rhythm.  Normal S1-S2.  ABD: Soft and nontender with no appreciable organomegaly  E XT: No cyanosis or edema  NEURO: CN 2-12 intact with no focal defects`  SKIN: No suspicious skin lesions         Assesment and Plan     Coronary artery disease.  2 vessel bypass 2012 and subsequent reported PCI subsequently.  Bypass surgery in 2012 with LIMA to LAD and SVG to OM.  Approximately 4 months after surgery the patient sustained MI and required percutaneous intervention by Dr. Lajoyce Pikes.   The patient had a 2.75 X 12 Promus stent placed in the proximal IMA graft and then a 2.75 X 8 Promus stent in the distal portion of the graft.  A 2.75 X 8 Promus stent was placed at the ostium of the LAD with plaque shifting to the circumflex.  A 3.5 X 8 Promus stent was then deployed in  the left main extending into the left circumflex with final kissing balloon angioplasty of the LAD and circumflex. She currently denies anginal symptoms.  She had testing done 2024 at another facility which  reported normal LV systolic function with only mild mitral and tricuspid regurgitation.  Stress test also was reported as negative for ischemia or infarction with normal function.  The patient is on aggressive medical therapy for underlying cardiovascular risk factors.  The patient does continue to smoke and she has been counseled on the importance of smoking cessation to minimize further adverse health.  She is going to try to cut down in hopefully start     Hypertension:  Blood pressure is adequately controlled.  I encouraged her to take precautions and try to avoid sudden position changes to minimize risk of falling.  She likely has a component of autonomic dysfunction associated with her diabetes mellitus.  I encouraged diligent with adequate fluid intake.  She states that she has reduced her Lasix  as she has not had any significant edema.  She is clinically euvolemic presentl     Hyperlipidemia:  Continue diet, Crestor  and Zetia .  Lipids have improved considerably with the LDL last year 206 and improved to 77 on current diet regimen.  Continue current therapy.  Ideally like to see the LDL below 70 but is dramatically improved.  The patient is taking over-the-counter Garlec and fish oil       Left knee weakness/intermittent giving out.  Symptoms are not cardiac in etiology Advised to follow-up with her PCP.  Instructed there are some exercises which could improve strengthening of her quads and to take precautions to minimize risk of falling.     Diabetes mellitus:  Managed by PCP and Endocrine.  She is on oral antidiabetic medications.  A1c has improved but remains suboptimal.  Continue target A1c below 7.0.  Again smoking cessation also recommended       Deeann Fare, MD  04/12/2024, 09:42

## 2024-04-15 ENCOUNTER — Other Ambulatory Visit: Payer: Self-pay | Admitting: Surgery

## 2024-04-15 DIAGNOSIS — G8918 Other acute postprocedural pain: Secondary | ICD-10-CM

## 2024-04-15 MED ORDER — OXYCODONE HCL 5 MG PO TABS: 5.0000 mg | ORAL_TABLET | Freq: Four times a day (QID) | ORAL | 0 refills | Status: DC | PRN

## 2024-04-19 ENCOUNTER — Encounter: Payer: Self-pay | Admitting: Internal Medicine

## 2024-04-28 ENCOUNTER — Ambulatory Visit: Admitting: Surgery

## 2024-04-28 ENCOUNTER — Encounter: Payer: Self-pay | Admitting: Surgery

## 2024-04-28 VITALS — BP 132/78 | HR 67 | Temp 98.1°F | Resp 16 | Ht 66.0 in | Wt 144.0 lb

## 2024-04-28 DIAGNOSIS — Z09 Encounter for follow-up examination after completed treatment for conditions other than malignant neoplasm: Secondary | ICD-10-CM

## 2024-05-03 NOTE — Progress Notes (Signed)
 [facility] Note   HPI:  58 y.o. Female presents to clinic for post-op follow-up status post robotic assisted laparoscopic umbilical and supraumbilical hernia repair with mesh on 5/5.  Patient overall has been doing well since surgery.  She is tolerating a diet, moving her bowels, and pain is well-controlled.  Her bowel movements have also become more regular.  She denies fevers and chills.  She denies issues at her incision sites.  Review of Systems:  All other review of systems: otherwise negative   Vital Signs:  BP 132/78   Pulse 67   Temp 98.1 F (36.7 C) (Oral)   Resp 16   Ht 5\' 6"  (1.676 m)   Wt 144 lb (65.3 kg)   LMP 05/16/2012   SpO2 94%   BMI 23.24 kg/m    Physical Exam:  Physical Exam Vitals reviewed.  Constitutional:      Appearance: Normal appearance.  Abdominal:     Comments: Abdomen soft, nondistended, no percussion tenderness, nontender to palpation; no rigidity, guarding, rebound tenderness; laparoscopic incision sites healing well with skin glue in place  Neurological:     Mental Status: She is alert.     Laboratory studies: None  Imaging:  None  Assessment:  58 y.o. yo Female who presents for follow-up status post robotic assisted laparoscopic umbilical and supraumbilical hernia repairs with mesh on 5/5  Plan:  - Patient overall doing well since the surgery.  Tolerating diet, moving her bowels, and pain well-controlled - Advised that she can use antibiotic ointment or Vaseline to help loosen the remaining skin glue - Educated the patient on the importance of continuing to take it easy for the next 2 weeks.  When she is 4 weeks postop, she can slowly start to increase her activities back to what she was doing preoperatively - Follow up as needed  All of the above recommendations were discussed with the patient, and all of patient's questions were answered to her expressed satisfaction.  Note: Portions of this report may have been  transcribed using voice recognition software. Every effort has been made to ensure accuracy; however, inadvertent computerized transcription errors may still be present.   Lidia Reels, DO [facility] [address] [place], [place] [address] [phone] (office)

## 2024-05-04 ENCOUNTER — Other Ambulatory Visit: Payer: Self-pay

## 2024-05-05 ENCOUNTER — Encounter (INDEPENDENT_AMBULATORY_CARE_PROVIDER_SITE_OTHER): Payer: Self-pay | Admitting: Family Medicine

## 2024-05-05 ENCOUNTER — Ambulatory Visit (INDEPENDENT_AMBULATORY_CARE_PROVIDER_SITE_OTHER): Admitting: Family Medicine

## 2024-05-05 VITALS — BP 102/58 | HR 76 | Temp 97.5°F | Resp 16 | Ht 66.0 in | Wt 200.2 lb

## 2024-05-05 DIAGNOSIS — Z0000 Encounter for general adult medical examination without abnormal findings: Secondary | ICD-10-CM

## 2024-05-05 DIAGNOSIS — Z1211 Encounter for screening for malignant neoplasm of colon: Secondary | ICD-10-CM

## 2024-05-05 DIAGNOSIS — Z1231 Encounter for screening mammogram for malignant neoplasm of breast: Secondary | ICD-10-CM

## 2024-05-05 DIAGNOSIS — F17210 Nicotine dependence, cigarettes, uncomplicated: Secondary | ICD-10-CM

## 2024-05-05 NOTE — Nursing Note (Signed)
 05/05/24 1312   Medicare Wellness Assessment   Medicare initial or wellness physical in the last year? No   Advance Directives   Does patient have a living will or MPOA No   Advance directive information given to the patient today? Patient Declined   Activities of Daily Living   Do you need help with dressing, bathing, or walking? No   Do you need help with shopping, housekeeping, medications, or finances? No   Do you have rugs in hallways, broken steps, or poor lighting? No   Do you have grab bars in your bathroom, non-slip strips in your tub, and hand rails on your stairs? Yes   Cognitive Function Screen   What is you age? 1   What is the time to the nearest hour? 1   What is the year? 1   What is the name of this clinic? 1   Can the patient recognize two persons (the doctor, the nurse, home help, etc.)? 1   What is the date of your birth? (day and month sufficient)  1   In what year did World War II end? 0   Who is the current president of the [place] ? 1   Count from 20 down to 1? 1   What address did I give you earlier? 1   Total Score 9   Interpretation of Total Score Greater than 6 Normal   Depression Screen   Little interest or pleasure in doing things. 0   Feeling down, depressed, or hopeless 0   PHQ 2 Total 0   Trouble falling or staying asleep, or sleeping too much. 0   Feeling tired or having little energy 0   Poor appetite or overeating 0   Feeling bad about yourself/ that you are a failure in the past 2 weeks? 0   Trouble concentrating on things in the past 2 weeks? 0   Moving/Speaking slowly or being fidgety or restless  in the past 2 weeks? 0   Thoughts that you would be better off DEAD, or of hurting yourself in some way. 0   If you checked off any problems, how difficult have these problems made it for you to do your work, take care of things at home, or get along with other people? Not difficult at all   PHQ 9 Total 0   Interpretation of Total Score No depression   Pain Score   Pain Score  SIX   Substance Use Screening   In Past 12 MONTHS, how often have you used any tobacco product (for example, cigarettes, e-cigarettes, cigars, pipes, or smokeless tobacco)? Daily   In the PAST 3 MONTHS, did you smoke a cigarette containing tobacco or use any other nicotine  delivery product (i.e., e-cigarette, vaping or chewing tobacco)? Yes   In the PAST 3 MONTHS, did you usually smoke more than 10 cigarettes, vape, use an e-cigarette or chew tobacco more than 10 times each day? No   In the PAST 3 MONTHS, did you usually smoke/use an e-cigarette, vape or chew tobacco within 30 minutes after waking? No   In the PAST 12 MONTHS, how often have you had 5 (men)/4 (women) or more drinks containing alcohol in one day? Never   In the PAST 12 months, how often have you used any prescription medications just for the feeling, more than prescribed, or that were not prescribed for you? Prescriptions may include: opioids, benzodiazepines, medications for ADHD Never   In the PAST 12 MONTHS, how  often have you used any drugs, including marijuana, cocaine or crack, heroin, methamphetamine, hallucinogens, ecstasy/MDMA? Never   Fall Risk Assessment   Do you feel unsteady when standing or walking? No   Do you worry about falling? No   Have you fallen in the past year? No   OTHER   Reported to Encounter Provider Yes

## 2024-05-05 NOTE — Nursing Note (Signed)
 05/05/24 1311   PHQ 9 (follow up)   Little interest or pleasure in doing things. 0   Feeling down, depressed, or hopeless 0   Trouble falling or staying asleep, or sleeping too much. 0   Feeling tired or having little energy 0   Poor appetite or overeating 0   Feeling bad about yourself/ that you are a failure in the past 2 weeks? 0   Trouble concentrating on things in the past 2 weeks? 0   Moving/Speaking slowly or being fidgety or restless  in the past 2 weeks? 0   Thoughts that you would be better off DEAD, or of hurting yourself in some way. 0   If you checked off any problems, how difficult have these problems made it for you to do your work, take care of things at home, or get along with other people? Not difficult at all   PHQ 9 Total 0   Interpretation of Total Score No depression

## 2024-05-05 NOTE — Progress Notes (Signed)
 FAMILY MEDICINE, [facility]  [address]  [place] [address]    Medicare Annual Wellness Visit    Name: Monique [name] MRN:  [id number]   Date: 05/05/2024 Age: 58 y.o.       SUBJECTIVE:   Monique [name] is a 58 y.o. female for presenting for Medicare Wellness exam.   I have reviewed and reconciled the medication list with the patient today.    Comprehensive Health Assessment:    Paper document COMPREHENSIVE HEALTH ASSESSMENT reviewed and scanned into medical record    She recently had an encounter with Cardiology they reviewed chronic disease management.    I have reviewed and updated as appropriate the past medical, family and social history. 05/05/2024 as summarized below:    Past Medical History:   Diagnosis Date    Anxiety     Arthritis     Back problem     DDD    Beta blocker prescribed for left ventricular systolic dysfunction     Blood thinned due to long-term anticoagulant use     Carotid stenosis     occlusion of left ICA    Chest pain 04/03/2018    Chronic back pain     Chronic obstructive pulmonary disease, unspecified COPD type (CMS HCC) 12/31/2022    Coronary artery disease involving native coronary artery of native heart     CVA (cerebral vascular accident)     DDD (degenerative disc disease), lumbar     "entire spine"    Ear piercing     GERD (gastroesophageal reflux disease)     controlled with omeprazole     H/O complete eye exam     2 years, Dr. 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 US  ordered.     Stented coronary artery      x4    Stroke     2002 and again 2025    Stroke 01/05/2024    treated at [facility] in [place], [place]     Tattoo     Left breast, Right shoulder    Thyroid  disorder     Thyroid  nodule     Type 2 diabetes mellitus     HGA1C 10.4 on 12/19/21    Uncontrolled type 2 diabetes mellitus, without long-term current use of insulin      Xanthelasma      Past Surgical History:   Procedure Laterality Date    Endometrial ablation  1998    Hx ankle fracture tx  1990s    Hx cataract removal Bilateral 2013    Hx cesarean section  06/20/2000    Hx cholecystectomy  1988    Hx coronary artery bypass graft  03/31/2011    Hx coronary stent placement  2012    Hx gastric sleeve  02/2017    Hx heart catheterization  2012  Hx thyroid  biopsy      Hx thyroidectomy  101/01/2018    Hx tonsillectomy      Hx yag Left 09/20/2013     Current Outpatient Medications   Medication Sig    aspirin  (ECOTRIN) 81 mg Oral Tablet, Delayed Release (E.C.) Take 1 Tablet (81 mg total) by mouth Daily    BD PRECISIONGLIDE 25 gauge x 1" Needle USE TO INJECT EVERY 30 DAYS    Blood Sugar Diagnostic (ACCU-CHEK GUIDE TEST STRIPS) Does not apply Strip TEST BLOOD SUGAR THREE TIMES DAILY    Blood-Glucose Meter (ACCU-CHEK GUIDE GLUCOSE METER) Misc USE AS DIRECTED    clopidogreL  (PLAVIX ) 75 mg Oral Tablet TAKE 1 TABLET EVERY DAY    cyanocobalamin  (VITAMIN B12) 1,000 mcg/mL Injection Solution INJECT 1 ML (1,000 MCG TOTAL) UNDER THE SKIN EVERY 30 DAYS    docusate sodium  (COLACE) 100 mg Oral Capsule TAKE 1 CAPSULE TWICE DAILY    empagliflozin  (JARDIANCE ) 10 mg Oral Tablet Take 1 Tablet (10 mg total) by mouth Once a day    ergocalciferol , vitamin D2, (DRISDOL ) 1,250 mcg (50,000 unit) Oral Capsule TAKE 1 CAPSULE BY MOUTH EVERY WEEK    ezetimibe  (ZETIA ) 10 mg Oral Tablet TAKE ONE TABLET BY MOUTH EVERY EVENING    HYDROcodone -acetaminophen  (NORCO) 10-325 mg Oral Tablet Take 1 Tablet by mouth Every 8 hours as needed for Pain Babette Bolognese with [facility]     Ibuprofen  (MOTRIN ) 800 mg Oral Tablet TAKE ONE TABLET BY MOUTH THREE TIMES DAILY AS NEEDED FOR PAIN WITH FOOD    Lancets (ACCU-CHEK SOFTCLIX LANCETS) Misc USE AS DIRECTED DAILY    levothyroxine  (SYNTHROID ) 25 mcg Oral Tablet TAKE 1 TABLET ONE TIME DAILY    metoprolol  succinate (TOPROL -XL) 25 mg Oral Tablet Sustained Release 24 hr Take 0.5 Tablets (12.5 mg total) by mouth Once a day    NARCAN  4 mg/actuation Nasal Spray, Non-Aerosol PT STATED SHE HAS NARCAN  AT HOME BUT IS NOT TAKING IT    niacin  (NIASPAN ) 500 mg Oral Tablet Sustained Release 24 hr TAKE 1 TABLET EVERY DAY    nitroGLYCERIN  (NITROSTAT ) 0.4 mg Sublingual Tablet, Sublingual Place 1 Tablet (0.4 mg total) under the tongue Every 5 minutes as needed for Chest pain for 3 doses over 15 minutes    omeprazole  (PRILOSEC) 40 mg Oral Capsule, Delayed Release(E.C.) TAKE 1 CAPSULE EVERY DAY    ondansetron  (ZOFRAN  ODT) 4 mg Oral Tablet, Rapid Dissolve Take 1 Tablet (4 mg total) by mouth Every 8 hours as needed for Nausea/Vomiting    ondansetron  (ZOFRAN  ODT) 8 mg Oral Tablet, Rapid Dissolve DISSOLVE 1 TABLET ON THE TONGUE EVERY 8 HOURS AS NEEDED FOR NAUSEA AND VOMITING    OZEMPIC  2 mg/dose (8 mg/3 mL) Subcutaneous Pen Injector Inject 2 mg under the skin Every 7 days    pioglitazone  (ACTOS ) 15 mg Oral Tablet Take 1 Tablet (15 mg total) by mouth Daily    PRENATAL VITAMIN PLUS LOW IRON  27 mg iron - 1 mg Oral Tablet TAKE 1 TABLET EVERY DAY    venlafaxine  (EFFEXOR  XR) 37.5 mg Oral Capsule, Sust. Release 24 hr TAKE 1 CAPSULE ONE TIME DAILY    ZYRTEC -D 5-120 mg Oral Tablet Sustained Release 12 hr Take 1 Tablet by mouth Once a day     Family Medical History:       Problem Relation (Age of Onset)    Atrial fibrillation Sister    Colon Cancer Father    Diabetes Mother, Maternal Aunt    Heart  Disease Sister, Maternal Grandfather    Hypertension (High Blood Pressure) Mother    Lung Cancer Maternal Grandfather    MI <64 years of age Mother            Social History     Socioeconomic  History    Marital status: Married     Spouse name: Mont Antis    Number of children: 3    Years of education: completed 11th grade   Occupational History    Occupation: disabled   Tobacco Use    Smoking status: Every Day     Average packs/day: 0.5 packs/day for 44.0 years (21.8 ttl pk-yrs)     Types: Cigarettes     Start date: 47    Smokeless tobacco: Never   Vaping Use    Vaping status: Never Used   Substance and Sexual Activity    Alcohol use: No    Drug use: No    Sexual activity: Yes     Partners: Male   Social History Narrative    Living situation: lives at home with husband, Mont Antis, and daughter.  1 dog named Duke and 1 cat named Lucky.    Nutrition:  Eats 5 small meals d/t gastric sleeve surgery.     Caffeine use: none, decaf coffee    Exercise: stays busy around house. No formal form of exercise    Seatbelt use: sometimes    Fire extinguishers in the home: yes    Smoke alarms in the home: yes    Carbon monoxide detectors in the home: yes    Paulene Boron exposure: sunglasses        Katlyne would like for husband, Anija Brickner,  to speak for her in the event she would become incapacitated.    Full code.      Social Determinants of Health     Transportation Needs: No Transportation Needs (12/26/2023)    Received from [facility] - Transportation     Lack of Transportation (Medical): No     Lack of Transportation (Non-Medical): No   Social Connections: Medium Risk (02/17/2023)    Social Connections     SDOH Social Isolation: 3 to 5 times a week   Intimate Partner Violence: Not At Risk (12/26/2023)    Received from [employer], Afraid, Rape, and Kick questionnaire     Fear of Current or Ex-Partner: No     Emotionally Abused: No     Physically Abused: No     Sexually Abused: No   Housing Stability: Low Risk  (12/26/2023)    Received from [employer] Stability Vital Sign     Unable to Pay for Housing in the Last Year: No     Number of Times Moved in the Last Year: 0     Homeless in the Last Year:  No   Health Literacy: Low Risk  (05/05/2024)    Health Literacy     SDOH Health Literacy: Never         List of Current Health Care Providers   Care Team       PCP       Name Type Specialty Phone Number    Kendra Pavy, MD Physician FAMILY MEDICINE [phone]              Care Team       Name Type Specialty Phone Number    Margarett Shavers, MD Not available GASTROENTEROLOGY [phone]  Legrand Puma., MD Not available PAIN MANAGEMENT [phone]    Deeann Fare, MD Physician INTERVENTIONAL CARDIOLOGY  [phone]                      Health Maintenance   Topic Date Due    Diabetic Retinal Exam  Never done    Adult Tdap-Td (1 - Tdap) Never done    Hepatitis B Vaccine (1 of 3 - 19+ 3-dose series) Never done    Colonoscopy  Never done    Pap smear/HPV  10/18/2013    CT Lung Cancer Screening  Never done    Breast Cancer Screening  02/07/2023    Shingles Vaccine (2 of 2) 12/29/2023    Diabetic Kidney Health Microalb/Cr Ratio  08/10/2024    Diabetic A1C  09/03/2024    Diabetic Kidney Health eGFR  03/03/2025    Hepatitis C screening  Completed    HIV Screening  Completed    Influenza Vaccine  Completed    Covid-19 Vaccine  Completed    Medicare Annual Wellness Visit - Calendar Year Insurers  Completed    Pneumococcal Vaccination, Age 36+  Completed     Medicare Wellness Assessment   Medicare initial or wellness physical in the last year?: No  Advance Directives   Does patient have a living will or MPOA: No           Advance directive information given to the patient today?: Patient Declined      Activities of Daily Living   Do you need help with dressing, bathing, or walking?: No   Do you need help with shopping, housekeeping, medications, or finances?: No   Do you have rugs in hallways, broken steps, or poor lighting?: No   Do you have grab bars in your bathroom, non-slip strips in your tub, and hand rails on your stairs?: Yes   Cognitive Function Screen (1=Yes, 0=No)   What is you age?: Correct    What is the time to the nearest hour?: Correct   What is the year?: Correct   What is the name of this clinic?: Correct   Can the patient recognize two persons (the doctor, the nurse, home help, etc.)?: Correct   What is the date of your birth? (day and month sufficient) : Correct   In what year did World War II end?: Incorrect   Who is the current president of the [place] ?: Correct   Count from 20 down to 1?: Correct   What address did I give you earlier?: Correct   Total Score: 9   Interpretation of Total Score: Greater than 6 Normal   Fall Risk Screen   Do you feel unsteady when standing or walking?: No  Do you worry about falling?: No  Have you fallen in the past year?: No   Depression Screen     Little interest or pleasure in doing things.: Not at all  Feeling down, depressed, or hopeless: Not at all  PHQ 2 Total: 0  Trouble falling or staying asleep, or sleeping too much.: Not at all  Feeling tired or having little energy: Not at all  Poor appetite or overeating: Not at all  Feeling bad about yourself/ that you are a failure in the past 2 weeks?: Not at all  Trouble concentrating on things in the past 2 weeks?: Not at all  Moving/Speaking slowly or being fidgety or restless  in the past 2 weeks?: Not at all  Thoughts  that you would be better off DEAD, or of hurting yourself in some way.: Not at all  PHQ 9 Total: 0  Interpretation of Total Score: 0-4 No depression     Pain Score   Pain Score:   6    Substance Use-Abuse Screening     Tobacco Use     In Past 12 MONTHS, how often have you used any tobacco product (for example, cigarettes, e-cigarettes, cigars, pipes, or smokeless tobacco)?: Daily  In the PAST 3 MONTHS, did you smoke a cigarette containing tobacco or use any other nicotine  delivery product (i.e., e-cigarette, vaping or chewing tobacco)?: Yes  In the PAST 3 MONTHS, did you usually smoke more than 10 cigarettes, vape, use an e-cigarette or chew tobacco more than 10 times each day?: No  In the  PAST 3 MONTHS, did you usually smoke/use an e-cigarette, vape or chew tobacco within 30 minutes after waking?: No     Alcohol use     In the PAST 12 MONTHS, how often have you had 5 (men)/4 (women) or more drinks containing alcohol in one day?: Never     Prescription Drug Use     In the PAST 12 months, how often have you used any prescription medications just for the feeling, more than prescribed, or that were not prescribed for you? Prescriptions may include: opioids, benzodiazepines, medications for ADHD: Never           Illicit Drug Use   In the PAST 12 MONTHS, how often have you used any drugs, including marijuana, cocaine or crack, heroin, methamphetamine, hallucinogens, ecstasy/MDMA?: Never           OBJECTIVE:     BP (!) 102/58 (Site: Left Arm, Patient Position: Sitting, Cuff Size: Adult Large)   Pulse 76   Temp 36.4 C (97.5 F) (Tympanic)   Resp 16   Ht 1.676 m (5\' 6" )   Wt 90.8 kg (200 lb 3.2 oz)   LMP  (LMP Unknown)   SpO2 95%   BMI 32.31 kg/m           Three year weight curve with recent trend to weight loss.  She has lost 3 lb since our last encounter.    Physical Examination:    GENERAL:   Pt is a pleasant, well-nourished, well-developed 58 y.o. female who is in NAD. Appears stated age  HEENT:  head normocephalic, symmetrical facies. EOM intact b/l. PERRLA. Sclera non-icteric, non-injected. upper eyelid w/Xythoma-lipid plaques, diminished in fullness and intensity. Oropharyngeal mucous membranes are moist  w/o erythema/exudates   NECK:   No masses, no lymphadenopathy, no JVD on exam. Trachea midline, no thyromegaly   CV: S1, S2. No murmurs, rubs, gallops.     LUNGS: CTAB, No rhonchi, rales, wheezes.   GI: (+) BS in all 4 quadrants. soft, NT/ND. No rigidity/positive for guarding/negative for rebound. No organomegaly/masses, or abdominal bruits,   MSK: Spontaneous normal AROM of major joints as observed during exam. No joint effusions/ swelling/ deformities.   Fourth digit he of the left lower  extremity is swollen and erythematous.  Tender to touch.  No BLE edema, clubbing, or cyanosis.   2+ radial pulse present b/l.   + 2 reflexes Brachioradialis and patellar bilaterally.   Gait normal.  SKIN: No significant lesions, rashes, ecchymoses noted.   NEURO: AAOx4, CN grossly intact. Sensation intact b/l. No focal deficits.  PSYCH: Mood, behavior and affect normal w/ Intact judgement & insight  Exam stable.  Other appropriate exam:    COMPREHENSIVE METABOLIC PANEL  Lab Results   Component Value Date    SODIUM 141 03/03/2024    POTASSIUM 4.2 03/03/2024    CHLORIDE 109 03/03/2024    CO2 24 03/03/2024    ANIONGAP 8 03/03/2024    BUN 11 03/03/2024    CREATININE 0.99 03/03/2024    GLUCOSENF 177 (H) 05/08/2021    GLUCOSE 177 (H) 03/03/2024    CALCIUM  9.2 03/03/2024    ALBUMIN  3.6 03/03/2024    TOTALPROTEIN 6.8 03/03/2024    ALKPHOS 100 03/03/2024    AST 32 03/03/2024    ALT 26 03/03/2024    GFR 66 03/03/2024         CBC  Diff   Lab Results   Component Value Date/Time    WBC 7.6 03/03/2024 10:54 AM    HGB 13.9 03/03/2024 10:54 AM    HCT 42.9 03/03/2024 10:54 AM    PLTCNT 324 03/03/2024 10:54 AM    RBC 4.68 03/03/2024 10:54 AM    MCV 91.7 03/03/2024 10:54 AM    MCHC 32.4 03/03/2024 10:54 AM    MCH 29.7 03/03/2024 10:54 AM    RDW 14.2 05/08/2021 12:25 PM    MPV 9.4 03/03/2024 10:54 AM    Lab Results   Component Value Date/Time    PMNS 56.3 03/03/2024 10:54 AM    LYMPHOCYTES 40 05/08/2021 12:25 PM    EOSINOPHIL 2 05/08/2021 12:25 PM    MONOCYTES 10.1 03/03/2024 10:54 AM    BASOPHILS 0.5 03/03/2024 10:54 AM    BASOPHILS <0.10 03/03/2024 10:54 AM    PMNABS 4.30 03/03/2024 10:54 AM    LYMPHSABS 2.35 03/03/2024 10:54 AM    EOSABS 0.15 03/03/2024 10:54 AM    MONOSABS 0.77 03/03/2024 10:54 AM    BASOSABS 0.034 04/01/2011 05:08 AM          Diabetes Monitors  A1C: 7.8  A1C Date: 03/03/2024  Kidney Health:   Urine Microalbumin/Cr Ratio Ur Microalb/Cr Ratio date--08/11/2023   eGFR --66 eGFR date--03/03/2024    Last Lipid Panel   (Last result in the past 2 years)        Cholesterol   HDL   LDL   Direct LDL   Triglycerides      03/03/24 1054 149   50   77  Comment: <100 mg/dL, Optimal  161-096 mg/dL, Near/Above Optimal  045-409 mg/dL, Borderline High  811-914 mg/dL, High  >=782 mg/dL, Very high     956            Retinal Exam Date: Not Found    Last Foot Exam: 12/22/2023      EKG (today in clinic)  Result Date: 04/12/2024  Normal sinus rhythm Normal ECG When compared with ECG of 16-Feb-2023 22:39, Nonspecific T wave abnormality no longer evident in Lateral leads    No results found for this or any previous visit (from the past 21308 hours).      Health Maintenance Due   Topic Date Due    Diabetic Retinal Exam  Never done    Adult Tdap-Td (1 - Tdap) Never done    Hepatitis B Vaccine (1 of 3 - 19+ 3-dose series) Never done    Colonoscopy  Never done    Pap smear/HPV  10/18/2013    CT Lung Cancer Screening  Never done    Breast Cancer Screening  02/07/2023    Shingles Vaccine (2 of 2) 12/29/2023      ASSESSMENT & PLAN:  Assessment/Plan   1. Medicare annual wellness visit, subsequent    2. Breast cancer screening by mammogram    3. Encounter for screening colonoscopy    4. Cigarette smoker       Identified Risk Factors/ Recommended Actions       Opioid use plan of care:  Opioids use: Plan: Assessment of pain completed and pain controlled    Patient declined Advanced Directives information.    MPOA:  Amani Marseille  Code status:  Full code    I spent 17 minutes discussing end of life planning with the patient including MPOA and code status as above.      No orders of the defined types were placed in this encounter.       The patient has been educated about risk factors and recommended preventive care. Written Prevention Plan completed/ updated and given to patient (see After Visit Summary).    Return in about 6 months (around 11/05/2024), or if symptoms worsen or fail to improve, for In Person Visit.    Kendra Pavy, MD    This note was partially  generated using M[employer] Fluency Direct system, and there may be some incorrect words, spellings, and punctuation that were not noted in checking the note before saving.

## 2024-05-05 NOTE — Patient Instructions (Signed)
 Medicare Preventive Services  Medicare coverage information Recommendation for YOU   Heart Disease and Diabetes   Lipid profile Every 5 years or more often if at risk for cardiovascular disease     Lab Results   Component Value Date    CHOLESTEROL 149 03/03/2024    HDLCHOL 50 03/03/2024    LDLCHOL 77 03/03/2024    TRIG 121 03/03/2024           Diabetes Screening    Yearly for those at risk for diabetes, 2 tests per year for those with prediabetes Last Glucose: 177    Diabetes Self Management Training or Medical Nutrition Therapy  For those with diabetes, up to 10 hrs initial training within a year, subsequent years up to 2 hrs of follow up training Optional for those with diabetes     Medical Nutrition Therapy  Three hours of one-on-one counseling in first year, two hours in subsequent years Optional for those with diabetes, kidney disease   Intensive Behavioral Therapy for Obesity  Face-to-face counseling, first month every week, month 2-6 every other week, month 7-12 every month if continued progress is documented Optional for those with Body Mass Index 30 or higher  Your There is no height or weight on file to calculate BMI.   Tobacco Cessation (Quitting) Counseling   Covers up to 8 smoking and tobacco-use cessation counseling sessions in a 39-month period.    Optional for those that use tobacco   Cancer Screening Last Completion Date   Colorectal screening   For anyone age 12 to 35 or any age if high risk:  Screening Colonoscopy every 10 yrs if low risk,  more frequent if higher risk  OR  Cologuard Stool DNA test once every 3 years OR  Fecal Occult Blood Testing yearly OR  Flexible  Sigmoidoscopy  every 5 yr OR  CT Colonography every 5 yrs      See below for due date if applicable.   Screening Pap Test   Recommended every 3 years for all women age 49 to 39, or every five years if combined with HPV test (routine screening not needed after total hysterectomy).  Medicare covers every 2 years or yearly if high  risk.  Screening Pelvic Exam   Medicare covers every 2 years, yearly if high risk or childbearing age with abnormal Pap in last 3 yrs.     See below for due date if applicable.   Screening Mammogram   Recommended every 2 years for women age 64 to 59, or more frequent if you have a higher risk. Selectively recommended for women between 40-49 based on shared decisions about risk. Covered by Medicare up to every year for women age 24 or older --02/06/2021  See below for due date if applicable.         Lung Cancer Screening  Annual low dose computed tomography (LDCT scan) is recommended for those age 96-80 who smoked 20 pack-years and are current smokers or quit smoking within past 15 years, after counseling by your doctor or nurse clinician about the possible benefits or harms.     See below for due date if applicable.   Vaccinations   Respiratory syncytial virus (RSV)  Age 75 years or older: Based on shared clinical decision-making with your provider.  Pneumococcal Vaccine  Recommended routinely age 2+ with one or two separate vaccines based on your risk. Recommended before age 36 if medical conditions with increased risk  Seasonal Influenza Vaccine  Once every  flu season   Hepatitis B Vaccine  3 doses if risk (including anyone with diabetes or liver disease)  Shingles Vaccine  Two doses at age 73 or older  Diphtheria Tetanus Pertussis Vaccine  ONCE as adult, booster every 10 years     Immunization History   Administered Date(s) Administered   . Covid-19 Vaccine,Moderna(Spikevax),47mcg/0.5mL,12 yr+,Seasonal 11/03/2023   . Covid-19 Vaccine,Moderna,12 Years+ 02/09/2020, 03/08/2020, 10/15/2020   . Influenza Vaccine, 6 month-adult 08/08/2010, 10/13/2012, 08/06/2017, 09/16/2018, 08/31/2019, 10/02/2020, 09/12/2021, 09/18/2022, 11/09/2023   . Pneumovax 10/02/2010   . Shingrix - Zoster Vaccine 11/03/2023   . VAXNEUVANCE  09/12/2021     Shingles vaccine and Diphtheria Tetanus Pertussis vaccines are available at pharmacies or  local health department without a prescription.   Other Preventative Screening  Last Completion Date   Bone Densitometry   Screening: All females ages 80 and older every 10 years if initial screening normal. Postmenopausal women ages 34-64 need screening with one or more risk factor: previous fracture, parental hip fracture, current smoker, low body weight, excessive alcohol use, Rheumatoid Arthritis   For women with diagnosed Osteoporosis, follow up is recommended every 2 years or a frequency recommended by your provider.       See below for due date if applicable.     Glaucoma Screening   Yearly if in high risk group such as diabetes, family history, African American age 28+ or Hispanic American age 80+   See your eye care provider for screening.   Hepatitis C Screening   Recommended  for those born between ages 18-79 years.   --01/30/2023  See below for due date if applicable.     HIV Testing  Recommended routinely at least ONCE, covered every year for age 29 to 57 regardless of risk, and every year for age over 35 who ask for the test or higher risk. Yearly or up to 3 times in pregnancy       --01/30/2023  See below for due date if applicable.   Abdominal Aortic Aneurysm Screening Ultrasound   Once with a family history of abdominal aortic aneurysms OR a female between65-75 and have smoked at least 100 cigarettes in your lifetime.         See below for due date if applicable.       Your Personalized Schedule for Preventive Tests   Health Maintenance: Pending and Last Completed       Date Due Completion Date    Diabetic Retinal Exam Never done ---    Adult Tdap-Td (1 - Tdap) Never done ---    Hepatitis B Vaccine (1 of 3 - 19+ 3-dose series) Never done ---    Colonoscopy Never done ---    Pap smear/HPV 10/18/2013 10/18/2010    CT Lung Cancer Screening Never done ---    Breast Cancer Screening 02/07/2023 02/06/2021    Shingles Vaccine (2 of 2) 12/29/2023 11/03/2023    Diabetic Kidney Health Microalb/Cr Ratio  08/10/2024 08/11/2023    Diabetic A1C 09/03/2024 03/03/2024    Diabetic Kidney Health eGFR 03/03/2025 03/03/2024             Non-Opioid Treatment for Chronic Pains   Treatment for chronic pain can be managed without opioids. Below are non-opioid options that may be considered and discussed with your provider to determine which options would be best for your health.    Over the counter or presciptions medications:  Acetaminophen  (Tylenol ) or Non-steroidal anti-inflammatories such as: Ibuprofen  (Motrin , Advil ), naproxen (Aleve), aspirin   Antidepressants  such as amitriptyline, nortriptyline (Pamelor),  Doxepin (Silenor), Imipramine (Tofranil) and others.  Anticonvulsant Nerve pain medications: Gabapentin  (Neurontin ), pregabalin (Lyrica)  Externally applied medications such as NSAID'S, lidocaine , capsaisin, and others  Injections: pain specialists can sometimes inject medications at the site of pain.    Alternative therapies such as  Acupuncture  Osteopathic manipulation  Chiropractic  Massage therapy       For Information on Advanced Directives for Health Care:  [place]:  [email or web address]  [place], [place], [place], [place] General Information: [email or web address]

## 2024-05-08 LAB — ECG W INTERP (AMB USE ONLY)(MUSE,IN CLINIC)
Atrial Rate: 66 {beats}/min
Calculated P Axis: 54 degrees
Calculated R Axis: -14 degrees
Calculated T Axis: 7 degrees
PR Interval: 166 ms
QRS Duration: 82 ms
QT Interval: 396 ms
QTC Calculation: 415 ms
Ventricular rate: 66 {beats}/min

## 2024-05-09 ENCOUNTER — Encounter (HOSPITAL_COMMUNITY): Payer: Self-pay

## 2024-05-09 NOTE — Nursing Note (Signed)
 Scheduled Low Dose CT Lung Screen for 05/16/24 @ 1330 [facility]. Verified Screening. Instructions and directions given. No Prior- Auth- [employer].

## 2024-05-11 ENCOUNTER — Telehealth (INDEPENDENT_AMBULATORY_CARE_PROVIDER_SITE_OTHER): Payer: Self-pay | Admitting: Family Medicine

## 2024-05-11 NOTE — Telephone Encounter (Signed)
 Patient states she called [facility] and they have no new refill requests for medications.    Requesting refills of all medications due.    Monique [name]

## 2024-05-11 NOTE — Telephone Encounter (Signed)
 Contacted [facility] and requested a refill request fax be sent to office.   [facility] to fax what is needing refills at this time that has been RX by our office.       Saraiah Bhat, LPN

## 2024-05-12 ENCOUNTER — Other Ambulatory Visit (INDEPENDENT_AMBULATORY_CARE_PROVIDER_SITE_OTHER): Payer: Self-pay | Admitting: Family Medicine

## 2024-05-12 DIAGNOSIS — R609 Edema, unspecified: Secondary | ICD-10-CM

## 2024-05-12 DIAGNOSIS — E1165 Type 2 diabetes mellitus with hyperglycemia: Secondary | ICD-10-CM

## 2024-05-12 DIAGNOSIS — E785 Hyperlipidemia, unspecified: Secondary | ICD-10-CM

## 2024-05-12 NOTE — Telephone Encounter (Signed)
 Last scheduled appointment with you was 05/05/2024.  Currently scheduled future appointment is 10/25/24      London Rivers, LPN  12/13/1094, 04:54

## 2024-05-13 ENCOUNTER — Other Ambulatory Visit: Payer: Self-pay

## 2024-05-13 ENCOUNTER — Ambulatory Visit: Payer: Self-pay | Attending: Surgery | Admitting: Surgery

## 2024-05-13 ENCOUNTER — Encounter (INDEPENDENT_AMBULATORY_CARE_PROVIDER_SITE_OTHER): Payer: Self-pay | Admitting: Surgery

## 2024-05-13 VITALS — BP 136/80 | HR 63 | Ht 66.0 in | Wt 200.8 lb

## 2024-05-13 DIAGNOSIS — Z1211 Encounter for screening for malignant neoplasm of colon: Secondary | ICD-10-CM | POA: Insufficient documentation

## 2024-05-13 DIAGNOSIS — Z6830 Body mass index (BMI) 30.0-30.9, adult: Secondary | ICD-10-CM

## 2024-05-13 DIAGNOSIS — Z01818 Encounter for other preprocedural examination: Secondary | ICD-10-CM | POA: Insufficient documentation

## 2024-05-13 DIAGNOSIS — E66811 Obesity, class 1: Secondary | ICD-10-CM | POA: Insufficient documentation

## 2024-05-13 DIAGNOSIS — Z800 Family history of malignant neoplasm of digestive organs: Secondary | ICD-10-CM | POA: Insufficient documentation

## 2024-05-15 ENCOUNTER — Encounter (INDEPENDENT_AMBULATORY_CARE_PROVIDER_SITE_OTHER): Payer: Self-pay | Admitting: Surgery

## 2024-05-15 NOTE — H&P (Signed)
 GENERAL SURGERY, [name]  [address]  [place] [address]  Operated by [facility]    Surgical Consult          PCP:  Monique Pavy, MD  Referring Provider: Kendra Pavy, MD     Chief Complaint:   Chief Complaint   Patient presents with    Colonoscopy     Pt is referred for a Screening colonoscopy.  She has a family history of colon cancer in her Father.        HISTORY OF PRESENT ILLNESS:    Monique [name] is a 58 y.o. year old female who presents today for evaluation for screening colonoscopy.  She reports history of the negative Cologuard test several years.  She has undergone no prior colonoscopies.  She does have a history colon cancer in her father.  She currently denies any complaints of abdominal pain, rectal bleeding or changes in bowel habits.  She is on no long-term anticoagulation.    Past Medical History:   Diagnosis Date    Anxiety     Arthritis     Back problem     DDD    Beta blocker prescribed for left ventricular systolic dysfunction     Blood thinned due to long-term anticoagulant use     Carotid stenosis     occlusion of left ICA    Chest pain 04/03/2018    Chronic back pain     Chronic obstructive pulmonary disease, unspecified COPD type (CMS HCC) 12/31/2022    Coronary artery disease involving native coronary artery of native heart     CVA (cerebral vascular accident)     DDD (degenerative disc disease), lumbar     "entire spine"    Ear piercing     GERD (gastroesophageal reflux disease)     controlled with omeprazole     H/O complete eye exam     2 years, Dr. 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 US  ordered.     Stented coronary artery     x4    Stroke     2002 and again 2025    Stroke 01/05/2024    treated at [facility] in [place]     Tattoo     Left breast, Right shoulder    Thyroid  disorder     Thyroid  nodule     Type 2 diabetes mellitus     HGA1C 10.4 on 12/19/21    Uncontrolled type 2 diabetes mellitus, without long-term current use of insulin      Xanthelasma           Past Surgical History:   Procedure Laterality Date    ENDOMETRIAL ABLATION  1998    HX ANKLE FRACTURE TX  1990s    Right, [facility] procedure    HX CATARACT REMOVAL Bilateral 2013  r and l eyes with implants    HX CESAREAN SECTION  06/20/2000    x3, 11/21/86, 11/10/84    HX CHOLECYSTECTOMY  1988    HX CORONARY ARTERY BYPASS GRAFT  03/31/2011    cardiac, 2 vessel, Dr. Deetta Farrow, [facility]    HX CORONARY STENT PLACEMENT  2012    x4 stents    HX GASTRIC SLEEVE  02/2017    [facility], [place], Dr. Bolivar Bushman HEART CATHETERIZATION  2012    massive heart attack and stents - [facility]    HX THYROID  BIOPSY      HX THYROIDECTOMY  101/01/2018    HX TONSILLECTOMY      as child    HX YAG Left 09/20/2013          Current Medications[1]  Allergies[2]  Social History[3]   Family Medical History:       Problem Relation (Age of Onset)    Atrial fibrillation Sister    Colon Cancer Father    Diabetes Mother, Maternal Aunt    Heart Disease Sister, Maternal Grandfather    Hypertension (High Blood Pressure) Mother    Lung Cancer Maternal Grandfather    MI <68 years of age Mother               REVIEW OF SYSTEMS:   The ROS was negative except per HPI    PHYSICAL EXAMINATION:     Vital Signs:    Vitals:    05/13/24 1208   BP: 136/80   Pulse: 63   SpO2: 95%   Weight: 91.1 kg (200 lb 13.4 oz)   Height: 1.676 m (5\' 6" )   BMI: 32.42           Physical Exam  Vitals and nursing note reviewed.   Constitutional:       General: She is not in acute distress.     Appearance:  Normal appearance. She is obese.   HENT:      Head: Normocephalic and atraumatic.      Nose: Nose normal.   Eyes:      Extraocular Movements: Extraocular movements intact.      Conjunctiva/sclera: Conjunctivae normal.   Neck:      Vascular: No carotid bruit.   Cardiovascular:      Rate and Rhythm: Normal rate and regular rhythm.      Pulses: Normal pulses.   Pulmonary:      Effort: Pulmonary effort is normal.      Breath sounds: Normal breath sounds.   Abdominal:      General: Abdomen is flat.      Palpations: Abdomen is soft.      Tenderness: There is no guarding.   Musculoskeletal:         General: Normal range of motion.      Cervical back: Normal range of motion.   Skin:     General: Skin is warm and dry.      Capillary Refill: Capillary refill takes less than 2 seconds.   Neurological:      General: No focal deficit present.      Mental Status: She is alert and oriented to person, place, and time. Mental status is at baseline.   Psychiatric:         Mood and Affect: Mood normal.         Behavior: Behavior normal.          Labs: I have reviewed the pertinent available lab results.  Radiology: I have reviewed the pertinent available radiology results.    ASSESSMENT AND PLAN:    ICD-10-CM    1. Family history of colon cancer in father  Z80.0       2. Encounter for screening colonoscopy  Z12.11       3. Obesity (BMI 30.0-34.9)  Z61.096        The patient the option of colonoscopy.  We discussed the procedure as well as preparation required.  We also discussed the general risks and benefits.  We also discussed the potential sequela of the complications including the potential need for surgery, colectomy/colostomy, potential for prolonged debility and even death with the occurrence of complications.  We discussed her increased risk for such.  All questions were answered and she wished to proceed.  We will schedule for colonoscopy      Zona Hippo, MD            [1]   Current Outpatient Medications:     aspirin   (ECOTRIN) 81 mg Oral Tablet, Delayed Release (E.C.), Take 1 Tablet (81 mg total) by mouth Daily, Disp: , Rfl:     BD PRECISIONGLIDE 25 gauge x 1" Needle, USE TO INJECT EVERY 30 DAYS, Disp: 3 Each, Rfl: 3    Blood Sugar Diagnostic (ACCU-CHEK GUIDE TEST STRIPS) Does not apply Strip, TEST BLOOD SUGAR THREE TIMES DAILY, Disp: 200 Each, Rfl: 3    Blood-Glucose Meter (ACCU-CHEK GUIDE GLUCOSE METER) Misc, USE AS DIRECTED, Disp: 1 Each, Rfl: 1    clopidogreL  (PLAVIX ) 75 mg Oral Tablet, TAKE 1 TABLET EVERY DAY, Disp: 90 Tablet, Rfl: 10    cyanocobalamin  (VITAMIN B12) 1,000 mcg/mL Injection Solution, INJECT 1 ML (1,000 MCG TOTAL) UNDER THE SKIN EVERY 30 DAYS, Disp: 3 mL, Rfl: 10    docusate sodium  (COLACE) 100 mg Oral Capsule, TAKE 1 CAPSULE TWICE DAILY, Disp: 180 Capsule, Rfl: 2    empagliflozin  (JARDIANCE ) 10 mg Oral Tablet, Take 1 Tablet (10 mg total) by mouth Once a day, Disp: , Rfl:     ergocalciferol , vitamin D2, (DRISDOL ) 1,250 mcg (50,000 unit) Oral Capsule, TAKE 1 CAPSULE BY MOUTH EVERY WEEK, Disp: 12 Capsule, Rfl: 3    ezetimibe  (ZETIA ) 10 mg Oral Tablet, TAKE ONE TABLET BY MOUTH EVERY EVENING, Disp: 90 Tablet, Rfl: 3    furosemide  (LASIX ) 40 mg Oral Tablet, TAKE 1 TABLET EVERY DAY, Disp: 90 Tablet, Rfl: 3    HYDROcodone -acetaminophen  (NORCO) 10-325 mg Oral Tablet, Take 1 Tablet by mouth Every 8 hours as needed for Pain Babette Bolognese with [facility], Disp: , Rfl:     Ibuprofen  (MOTRIN ) 800 mg Oral Tablet, TAKE ONE TABLET BY MOUTH THREE TIMES DAILY AS NEEDED FOR PAIN WITH FOOD, Disp: 90 Tablet, Rfl: 3    Lancets (ACCU-CHEK SOFTCLIX LANCETS) Misc, TEST BLOOD SUGAR THREE TIMES DAILY AS DIRECTED, Disp: 300 Each, Rfl: 3    levothyroxine  (SYNTHROID ) 25 mcg Oral Tablet, TAKE 1 TABLET ONE TIME DAILY, Disp: 90 Tablet, Rfl: 10    metoprolol  succinate (TOPROL -XL) 25 mg Oral Tablet Sustained Release 24 hr, Take 0.5 Tablets (12.5 mg total) by mouth Once a day, Disp: 45 Tablet, Rfl: 3    NARCAN  4 mg/actuation  Nasal Spray, Non-Aerosol, PT STATED SHE HAS NARCAN  AT HOME BUT IS NOT TAKING IT, Disp: , Rfl:     niacin  (NIASPAN ) 500 mg Oral Tablet Sustained Release 24 hr, TAKE 1 TABLET EVERY DAY, Disp: 90 Tablet, Rfl: 3    nitroGLYCERIN  (NITROSTAT ) 0.4 mg  Sublingual Tablet, Sublingual, Place 1 Tablet (0.4 mg total) under the tongue Every 5 minutes as needed for Chest pain for 3 doses over 15 minutes, Disp: 25 Tablet, Rfl: 3    omeprazole  (PRILOSEC) 40 mg Oral Capsule, Delayed Release(E.C.), TAKE 1 CAPSULE EVERY DAY, Disp: 90 Capsule, Rfl: 3    ondansetron  (ZOFRAN  ODT) 4 mg Oral Tablet, Rapid Dissolve, Take 1 Tablet (4 mg total) by mouth Every 8 hours as needed for Nausea/Vomiting, Disp: 30 Tablet, Rfl: 0    ondansetron  (ZOFRAN  ODT) 8 mg Oral Tablet, Rapid Dissolve, DISSOLVE 1 TABLET ON THE TONGUE EVERY 8 HOURS AS NEEDED FOR NAUSEA AND VOMITING, Disp: 90 Tablet, Rfl: 11    OZEMPIC  2 mg/dose (8 mg/3 mL) Subcutaneous Pen Injector, Inject 2 mg under the skin Every 7 days, Disp: , Rfl:     pioglitazone  (ACTOS ) 15 mg Oral Tablet, Take 1 Tablet (15 mg total) by mouth Daily, Disp: , Rfl:     PRENATAL VITAMIN PLUS LOW IRON  27 mg iron - 1 mg Oral Tablet, TAKE 1 TABLET EVERY DAY, Disp: 90 Tablet, Rfl: 3    venlafaxine  (EFFEXOR  XR) 37.5 mg Oral Capsule, Sust. Release 24 hr, TAKE 1 CAPSULE ONE TIME DAILY, Disp: 90 Capsule, Rfl: 10    ZYRTEC -D 5-120 mg Oral Tablet Sustained Release 12 hr, Take 1 Tablet by mouth Once a day, Disp: 90 Tablet, Rfl: 3  [2]   Allergies  Allergen Reactions    Capron Dm [Pyrilamine -Dextromethorphan ]  Other Adverse Reaction (Add comment)     LIGHT HEADED    Other      Coban dressing caused skin break down    Tylenol  W Codeine  [Acetaminophen -Codeine ]  Other Adverse Reaction (Add comment)     pt states that one time she had chest pains with this med, but dr. thought it was because she had not eaten   [3]   Social History  Tobacco Use    Smoking status: Every Day     Current packs/day: 0.25     Average packs/day: 0.5  packs/day for 44.4 years (21.9 ttl pk-yrs)     Types: Cigarettes     Start date: 68    Smokeless tobacco: Never   Vaping Use    Vaping status: Never Used   Substance Use Topics    Alcohol use: No    Drug use: No

## 2024-05-16 ENCOUNTER — Ambulatory Visit
Admission: RE | Admit: 2024-05-16 | Discharge: 2024-05-16 | Disposition: A | Discharge status: Home / Self Care | Payer: Self-pay | Source: Ambulatory Visit | Attending: Family Medicine

## 2024-05-16 ENCOUNTER — Other Ambulatory Visit: Payer: Self-pay

## 2024-05-16 DIAGNOSIS — F17210 Nicotine dependence, cigarettes, uncomplicated: Secondary | ICD-10-CM | POA: Insufficient documentation

## 2024-05-17 ENCOUNTER — Ambulatory Visit

## 2024-05-17 ENCOUNTER — Ambulatory Visit (HOSPITAL_COMMUNITY): Payer: Self-pay | Admitting: Family Medicine

## 2024-05-17 NOTE — Result Encounter Note (Signed)
 Please inform the patient that the CT of her lungs showed low chance of any malignancy.  It did show mild Chronic Obstructive Pulmonary Disease and coronary artery disease.    No change to her plan of care-continue to recommend smoking cessation.  She should have this test repeated in 1 year.        TY TH

## 2024-05-18 ENCOUNTER — Ambulatory Visit
Admission: RE | Admit: 2024-05-18 | Discharge: 2024-05-18 | Disposition: A | Discharge status: Home / Self Care | Source: Ambulatory Visit | Attending: Family Medicine | Admitting: Family Medicine

## 2024-05-18 ENCOUNTER — Other Ambulatory Visit: Payer: Self-pay

## 2024-05-18 DIAGNOSIS — Z1231 Encounter for screening mammogram for malignant neoplasm of breast: Secondary | ICD-10-CM | POA: Insufficient documentation

## 2024-05-19 NOTE — Result Encounter Note (Signed)
 Please inform the patient that her mammogram showed no worrisome mass or lesion.      Making no changes to the plan of care.      Will repeat this test in 1 year.      TY TH

## 2024-05-27 ENCOUNTER — Other Ambulatory Visit: Payer: Self-pay

## 2024-05-27 ENCOUNTER — Ambulatory Visit: Attending: FAMILY MEDICINE | Admitting: FAMILY MEDICINE

## 2024-05-27 ENCOUNTER — Encounter (INDEPENDENT_AMBULATORY_CARE_PROVIDER_SITE_OTHER): Payer: Self-pay

## 2024-05-27 VITALS — BP 136/80 | HR 73 | Temp 99.2°F | Resp 18 | Ht 66.0 in | Wt 201.0 lb

## 2024-05-27 DIAGNOSIS — L84 Corns and callosities: Secondary | ICD-10-CM | POA: Insufficient documentation

## 2024-05-27 NOTE — Progress Notes (Signed)
 RAPID CARE, [facility]  [address]  [place] [address]  Operated by [facility]  Progress Note    Name: Monique [name] MRN:  [id number]   Date: 05/27/2024 DOB:  07-13-1966 (57 y.o.)           Assessment   Problem List Items Addressed This Visit    None  Visit Diagnoses         Corn of toe    -  Primary          Plan   Patient appears to be experiencing likely corn of toe.  Likely irritated by restrictive footwear.  Advised she pick up some pads to offload pressure and follow up with her podiatrist.  Counseled that if symptoms significantly worsen to seek re-evaluation.    Terre Sharps, MD     History of present Illness:  Monique [name] is an 58 y.o. female presenting for evaluation of right 4th toe dorsal painful lump that started bothering her in the last few days.  Denies any acute injury or impact.  Subjective   Chief Complaint   Patient presents with    Bump     Pt c/o knot on 4th toe on her Rt. Foot.  Pt. States it was thumping earlier.  No itching or burning. Not sure what happened.      Review of Systems:  ROS negative except noted in HPI     Objective   Vitals:    05/27/24 1900   BP: 136/80   Pulse: 73   Resp: 18   Temp: 37.3 C (99.2 F)   TempSrc: Tympanic   SpO2: 97%   Weight: 91.2 kg (201 lb)   Height: 1.676 m (5' 6)   BMI: 32.44      Physical Exam  Cardiovascular:      Rate and Rhythm: Normal rate.   Pulmonary:      Effort: Pulmonary effort is normal. No respiratory distress.   Musculoskeletal:      Comments: Normal range of motion of toe   Skin:     General: Skin is warm.      Comments: Slightly tender lump overlying the dorsal 4th toe.   Psychiatric:         Mood and Affect: Mood normal.          There are no Patient Instructions on file for this visit.

## 2024-06-01 ENCOUNTER — Other Ambulatory Visit (INDEPENDENT_AMBULATORY_CARE_PROVIDER_SITE_OTHER): Payer: Self-pay | Admitting: Family Medicine

## 2024-06-01 NOTE — Telephone Encounter (Signed)
 Last scheduled appointment with you was 05/05/2024.  Currently scheduled future appointment is 10/25/2024.      Harlene Antonio, LPN  3/74/7974, 08:07

## 2024-06-20 ENCOUNTER — Other Ambulatory Visit: Payer: Self-pay

## 2024-06-20 ENCOUNTER — Encounter (INDEPENDENT_AMBULATORY_CARE_PROVIDER_SITE_OTHER): Payer: Self-pay

## 2024-06-20 ENCOUNTER — Ambulatory Visit: Payer: Self-pay

## 2024-06-20 VITALS — BP 134/78 | Ht 65.98 in | Wt 201.0 lb

## 2024-06-20 DIAGNOSIS — M2041 Other hammer toe(s) (acquired), right foot: Secondary | ICD-10-CM | POA: Insufficient documentation

## 2024-06-20 DIAGNOSIS — M79674 Pain in right toe(s): Secondary | ICD-10-CM | POA: Insufficient documentation

## 2024-06-20 DIAGNOSIS — L84 Corns and callosities: Secondary | ICD-10-CM | POA: Insufficient documentation

## 2024-06-21 NOTE — H&P (Unsigned)
 [facility] [place]  [address]  [place] [address]  Operated by [facility]    New Patient       Name: Monique [name] MRN:  [id number]   Date: 06/20/2024 Age: 58 y.o.     This note was created with assistance from [employer] via capture of conversational audio.  Consent was obtained from the patient prior to recording.      Chief Complaint:    Foot Pain (Corn on the right foot 4th digit. Pain 0/10. FWB in nikes. A1C 7.8% 03/04/24)      History of Present Illness  Monique [name] is a 58 year old female with diabetes who presents with a painful corn on her right fourth toe.    The corn on her right fourth toe became sore a few weeks ago, causing discomfort, especially when wearing certain shoes due to friction and rubbing at the top of the joint.    She has a history of diabetes with a recent A1c of 7.4. Her blood sugar levels have been stable recently. She recalls a previous incident of a broken toe on her other foot, which required surgery and pinning after an initial misdiagnosis, making her cautious about her current foot issues.    She uses compression stockings daily, which she finds beneficial for her legs, especially after her open heart surgery, and prefers the Pro Compression brand.    History:  Past Medical History:   Diagnosis Date    Anxiety     Arthritis     Back problem     DDD    Beta blocker prescribed for left ventricular systolic dysfunction     Blood thinned due to long-term anticoagulant use     Carotid stenosis     occlusion of left ICA    Chest pain 04/03/2018    Chronic back pain     Chronic obstructive pulmonary disease, unspecified COPD type (CMS HCC) 12/31/2022    Coronary artery disease involving native coronary artery of native heart     CVA (cerebral vascular accident)     DDD (degenerative disc disease), lumbar     entire spine    Ear piercing     GERD (gastroesophageal reflux disease)     controlled with omeprazole     H/O complete eye  exam     2 years, Dr. 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 US  ordered.     Stented coronary artery     x4    Stroke     2002 and again 2025    Stroke 01/05/2024    treated at [facility] in [place], [place]     Tattoo     Left breast, Right shoulder    Thyroid  disorder     Thyroid  nodule     Type 2 diabetes mellitus  HGA1C 10.4 on 12/19/21    Uncontrolled type 2 diabetes mellitus, without long-term current use of insulin      Xanthelasma          Family Medical History:       Problem Relation (Age of Onset)    Atrial fibrillation Sister    Colon Cancer Father    Diabetes Mother, Maternal Aunt    Heart Disease Sister, Maternal Grandfather    Hypertension (High Blood Pressure) Mother    Lung Cancer Maternal Grandfather    MI <40 years of age Mother            Social History     Socioeconomic History    Marital status: Married     Spouse name: Darina    Number of children: 3    Years of education: completed 11th grade   Occupational History    Occupation: disabled   Tobacco Use    Smoking status: Every Day     Current packs/day: 0.25     Average packs/day: 0.5 packs/day for 44.5 years (21.9 ttl pk-yrs)     Types: Cigarettes     Start date: 110    Smokeless tobacco: Never   Vaping Use    Vaping status: Never Used   Substance and Sexual Activity    Alcohol use: No    Drug use: No    Sexual activity: Yes     Partners: Male   Other Topics Concern    Ability to Walk 1 Flight of Steps without SOB/CP Yes    Routine Exercise No    Ability to Walk 2 Flight of Steps without SOB/CP  Yes    Ability To Do Own ADL's Yes    Uses Walker No    Other Activity Level Yes     Comment: on the go all the time - does cleaning, grocery shopping    Uses Cane No   Social History Narrative    Living situation: lives at home with husband, Darina, and daughter.  1 dog named Duke and 1 cat named Lucky.    Nutrition:  Eats 5 small meals d/t gastric sleeve surgery.     Caffeine use: none, decaf coffee    Exercise: stays busy around house. No formal form of exercise    Seatbelt use: sometimes    Fire extinguishers in the home: yes    Smoke alarms in the home: yes    Carbon monoxide detectors in the home: yes    [place] exposure: sunglasses        Otha would like for husband, Varetta Chavers,  to speak for her in the event she would become incapacitated.    Full code.      Social Determinants of Health     Transportation Needs: No Transportation Needs (12/26/2023)    Received from [facility]    PRAPARE - Transportation     Lack of Transportation (Medical): No     Lack of Transportation (Non-Medical): No   Social Connections: Medium Risk (02/17/2023)    Social Connections     SDOH Social Isolation: 3 to 5 times a week   Intimate Partner Violence: Not At Risk (12/26/2023)    Received from [employer], Afraid, Rape, and Kick questionnaire     Fear of Current or Ex-Partner: No     Emotionally Abused: No     Physically Abused: No     Sexually Abused: No   Housing Stability: Low Risk  (12/26/2023)  Received from [employer] Stability Vital Sign     Unable to Pay for Housing in the Last Year: No     Number of Times Moved in the Last Year: 0     Homeless in the Last Year: No      Past Surgical History:   Procedure Laterality Date    ENDOMETRIAL ABLATION  1998    HX ANKLE FRACTURE TX  1990s    Right, Margarito Molt procedure    HX CATARACT REMOVAL Bilateral 2013    r and l eyes with implants    HX CESAREAN SECTION  06/20/2000    x3, 11/21/86, 11/10/84    HX CHOLECYSTECTOMY  1988    HX CORONARY ARTERY BYPASS GRAFT   03/31/2011    cardiac, 2 vessel, Dr. Quintin, [facility]    HX CORONARY STENT PLACEMENT  2012    x4 stents    HX GASTRIC SLEEVE  02/2017    [place], [place], Dr. Feliciana JOY HEART CATHETERIZATION  2012    massive heart attack and stents - [facility]    HX THYROID  BIOPSY      HX THYROIDECTOMY  101/01/2018    HX TONSILLECTOMY      as child    HX YAG Left 09/20/2013         Medications:  Current Outpatient Medications   Medication Sig    aspirin  (ECOTRIN) 81 mg Oral Tablet, Delayed Release (E.C.) Take 1 Tablet (81 mg total) by mouth Daily    BD PRECISIONGLIDE 25 gauge x 1 Needle USE TO INJECT EVERY 30 DAYS    Blood Sugar Diagnostic (ACCU-CHEK GUIDE TEST STRIPS) Does not apply Strip TEST BLOOD SUGAR THREE TIMES DAILY    Blood-Glucose Meter (ACCU-CHEK GUIDE GLUCOSE METER) Misc USE AS DIRECTED    clopidogreL  (PLAVIX ) 75 mg Oral Tablet TAKE 1 TABLET EVERY DAY    cyanocobalamin  (VITAMIN B12) 1,000 mcg/mL Injection Solution INJECT 1 ML (1,000 MCG TOTAL) UNDER THE SKIN EVERY 30 DAYS    docusate sodium  (COLACE) 100 mg Oral Capsule TAKE 1 CAPSULE TWICE DAILY    empagliflozin  (JARDIANCE ) 10 mg Oral Tablet Take 1 Tablet (10 mg total) by mouth Once a day    ergocalciferol , vitamin D2, (DRISDOL ) 1,250 mcg (50,000 unit) Oral Capsule TAKE 1 CAPSULE BY MOUTH EVERY WEEK    ezetimibe  (ZETIA ) 10 mg Oral Tablet TAKE ONE TABLET BY MOUTH EVERY EVENING    furosemide  (LASIX ) 40 mg Oral Tablet TAKE 1 TABLET EVERY DAY    HYDROcodone -acetaminophen  (NORCO) 10-325 mg Oral Tablet Take 1 Tablet by mouth Every 8 hours as needed for Pain Etta Sharps with [facility]    Ibuprofen  (MOTRIN ) 800 mg Oral Tablet TAKE ONE TABLET BY MOUTH THREE TIMES DAILY AS NEEDED FOR PAIN WITH FOOD    Lancets (ACCU-CHEK SOFTCLIX LANCETS) Misc TEST BLOOD SUGAR THREE TIMES DAILY AS DIRECTED    levothyroxine  (SYNTHROID ) 25 mcg Oral Tablet TAKE 1 TABLET ONE TIME DAILY    metoprolol  succinate (TOPROL -XL) 25 mg Oral Tablet Sustained Release 24 hr Take 0.5 Tablets  (12.5 mg total) by mouth Once a day    NARCAN  4 mg/actuation Nasal Spray, Non-Aerosol PT STATED SHE HAS NARCAN  AT HOME BUT IS NOT TAKING IT    niacin  (NIASPAN ) 500 mg Oral Tablet Sustained Release 24 hr TAKE 1 TABLET EVERY DAY    nitroGLYCERIN  (NITROSTAT ) 0.4 mg Sublingual Tablet, Sublingual Place 1 Tablet (0.4 mg total) under the tongue Every 5 minutes  as needed for Chest pain for 3 doses over 15 minutes    omeprazole  (PRILOSEC) 40 mg Oral Capsule, Delayed Release(E.C.) TAKE 1 CAPSULE EVERY DAY    ondansetron  (ZOFRAN  ODT) 4 mg Oral Tablet, Rapid Dissolve Take 1 Tablet (4 mg total) by mouth Every 8 hours as needed for Nausea/Vomiting    ondansetron  (ZOFRAN  ODT) 8 mg Oral Tablet, Rapid Dissolve DISSOLVE 1 TABLET ON THE TONGUE EVERY 8 HOURS AS NEEDED FOR NAUSEA AND VOMITING    OZEMPIC  2 mg/dose (8 mg/3 mL) Subcutaneous Pen Injector Inject 2 mg under the skin Every 7 days    pioglitazone  (ACTOS ) 15 mg Oral Tablet Take 1 Tablet (15 mg total) by mouth Daily    PRENATAL VITAMIN PLUS LOW IRON  27 mg iron - 1 mg Oral Tablet TAKE 1 TABLET EVERY DAY    venlafaxine  (EFFEXOR  XR) 37.5 mg Oral Capsule, Sust. Release 24 hr TAKE 1 CAPSULE ONE TIME DAILY    ZYRTEC -D 5-120 mg Oral Tablet Sustained Release 12 hr Take 1 Tablet by mouth Once a day     Allergies:  Allergies[1]    ROS:  Review of systems negative except noted in HPI     PE:  Vitals:    06/20/24 1448   BP: 134/78   Weight: 91.2 kg (201 lb)   Height: 1.676 m (5' 5.98)   BMI: 32.46        Physical Exam  VASCULAR: Dorsalis pedis and posterior tibial pulses 2 out of 4 bilateral foot. Capillary fill time less than 3 seconds to digits 1-5 bilateral foot. Skin temperature warm to warm tibial tuberosity to the digits bilateral foot. Hair growth present bilateral foot. No edema bilateral foot. No varicosities bilateral foot.  MUSCULOSKELETAL: Structurally within normal limits. 5 out of 5 muscle strength Dorsiflexion, Plantarflexion, Inversion, Eversion bilateral foot. ROM of the 1st  MTP joint is full bilateral foot. ROM of the MTJ and STJ is full without pain or crepitus bilateral foot. Ankle joint ROM is normal bilateral foot. Semi flexible contracture of the right fourth digit with mild pain on ROM of the fourth IPJ.  NEUROLOGICAL: Intact vibratory sensation at the hallux IPJ bilateral foot. Intact protective sensation present via SWMF.  SKIN: No skin rash, subcutaneous nodules, lesions or ulcers observed bilateral LE. Skin is warm and dry with normal turgor. No interdigital maceration. No subcutaneous nodules or acute signs of infection on the right fourth digit.  EXTREMITIES: Swelling observed in the legs.     Imaging Studies and Procedures:    Ankle/Foot - Results for orders placed during the hospital encounter of 08/11/23    XR FEET WT BEARING BILATERAL 08/11/2023  1:26 PM      Results for orders placed during the hospital encounter of 03/06/22    XR FOOT WEIGHT BEARING LT 03/06/2022  1:54 PM    Impression  FINDINGS/IMPRESSION:  Interval removal of K wire fixation of the left fourth middle phalangeal fracture, with unchanged alignment.q     Assessment and Plan:    ICD-10-CM    1. Hammertoe of right foot  M20.41       2. Callus of foot  L84       3. Pain of toe of right foot  M79.674              A comprehensive history and physical examination were preformed. The patient was educated on clinical and radiographic findings, diagnosis and treatment plans. Patient state that he understands all that has been explained and  all questions were answered to his apparent satisfaction.   Assessment & Plan  Hammertoe of right foot  Discussed with the patient etiology of hammertoes.  Discussed with the patient the most common cause of hammertoes as an imbalance in the muscles in the tendinous structures that attached to the digits.  Discussed with the patient and variations of hammertoe etiology including flexor stabilization, flexor substitution, an extensor substitution.  Discussed with the patient that  based on these etiology we will determine best course of action and if soft tissue or osseous procedures are necessary.  Discussed with the patient soft tissue procedures including capsular release, flexor tenotomy, extensor tenotomies.  Discussed with the patient nonsurgical treatment which includes padding and toe splints in order to help pad the area and prevent rubbing in shoes, discussed with the patient changes in shoe gear in order to prevent rubbing in shoes which would involve a wider toe box, discussed with the patient use of orthotics in order to help control the muscle and tendon imbalance, discussed with the patient medications such as NSAIDs in order to help with pain and inflammation.  Discussed with the patient surgical intervention which involves either an arthroplasty or arthrodesis.  Discussed with the patient arthroplasty involves resecting part of the joint.  Discussed with the patient arthrodesis which involves resecting both parts of the joint in order to fuse the toe in a fixed position.  Discussed with the patient this is based on physical exam in order to determine what procedures necessary.  Discussed with the patient due to flexible rigidity that patient will most likely benefit from a arthroplasty versus arthrodesis.    Surgical Discussion:  Etiology and treatment options, both conservative and surgical were discussed in detail with the patient. Patient believes that they have exhausted all conservative options and is electing to proceed with surgical intervention at this time.     Advised that given the patient's deformity that the patient would benefit from  right foot arthroplasty of the 4th digit     I have educated the patient on the details of the procedure as well as medically reasonable risks, benefits, alternatives and prognosis.  I also educated the patient on perioperative management including postoperative care and rehabilitation, anticipated time to full weight-bearing,  return to shoes and maximum medical improvement. Patient verbalizes their understanding and acknowledges their weight bearing restrictions. Lastly I educated the patient on their apparent understanding of potential complications including but not limited to:  Infection, recurrence, over-correction or under correction, persistent pain, swelling or disability, nerve injury or entrapment, bone and soft tissue healing complications, complications with anesthesia and deep vein thrombosis/pulmonary embolism, limb loss and death.        Callus of foot         Pain of toe of right foot           Assessment & Plan  Hammer toe with corn    She has a semi-flexible contracture of the right fourth digit with mild pain on range of motion at the fourth IPJ and a corn due to friction at the joint. Provide a toe cap to prevent friction and relieve pain. Advise using a buddy splint for additional support. Monitor A1c levels and aim to reduce them closer to 7 before considering surgical intervention.    Pain in toes    Pain in the toes is linked to the hammer toe and corn on the right fourth digit. Conservative management with a toe cap and buddy  splint should alleviate pain by reducing friction and pressure on the affected area. Use a toe cap to reduce friction and consider a buddy splint for additional support and pain relief.    Varicose veins with swelling    She has varicose veins with associated leg swelling due to venous insufficiency. Recommend wearing compression stockings daily to manage swelling and improve comfort. Suggest Pro Compression brand stockings, available online.  VISIT SUMMARY:  Today, you were seen for a painful corn on your right fourth toe. We discussed your history of diabetes and your use of compression stockings for varicose veins and leg swelling. We also reviewed your recent A1c levels and your previous foot surgery.    YOUR PLAN:  -HAMMER TOE WITH CORN: A hammer toe is a deformity that causes your toe  to bend or curl downward instead of pointing forward. You have a semi-flexible contracture of your right fourth toe with a corn due to friction at the joint. We recommend using a toe cap to prevent friction and relieve pain, and a buddy splint for additional support. We will monitor your A1c levels and aim to reduce them closer to 7 before considering any surgical intervention.    -PAIN IN TOES: The pain in your toes is related to the hammer toe and corn on your right fourth toe. Using a toe cap and buddy splint should help reduce friction and pressure, thereby alleviating the pain.    -VARICOSE VEINS WITH SWELLING: Varicose veins are enlarged veins that can cause swelling and discomfort. To manage the swelling and improve comfort, continue wearing compression stockings daily. We recommend the Pro Compression brand, which you can find online.    INSTRUCTIONS:  Please follow up with us  to monitor your A1c levels and discuss any changes in your symptoms. If your pain persists or worsens, contact our office for further evaluation.       On the day of the encounter, a total of 30 minutes was spent on this patient encounter including review of historical information, examination, documentation and post-visit activities. The time documented excludes procedural time and time spent on wellness portion of the visit.         Follow Up:  Return if symptoms worsen or fail to improve.     Sherlean Kast, DPM         [1]   Allergies  Allergen Reactions    Capron Dm [Pyrilamine -Dextromethorphan ]  Other Adverse Reaction (Add comment)     LIGHT HEADED    Other      Coban dressing caused skin break down    Tylenol  W Codeine  [Acetaminophen -Codeine ]  Other Adverse Reaction (Add comment)     pt states that one time she had chest pains with this med, but dr. thought it was because she had not eaten

## 2024-06-23 ENCOUNTER — Ambulatory Visit (INDEPENDENT_AMBULATORY_CARE_PROVIDER_SITE_OTHER): Payer: Self-pay | Admitting: Foot Surgery

## 2024-06-24 ENCOUNTER — Other Ambulatory Visit: Payer: Self-pay

## 2024-06-24 ENCOUNTER — Encounter (INDEPENDENT_AMBULATORY_CARE_PROVIDER_SITE_OTHER): Payer: Self-pay

## 2024-06-24 ENCOUNTER — Ambulatory Visit: Attending: FAMILY MEDICINE | Admitting: FAMILY MEDICINE

## 2024-06-24 VITALS — BP 120/76 | HR 77 | Temp 97.3°F | Resp 20 | Ht 66.0 in | Wt 205.2 lb

## 2024-06-24 DIAGNOSIS — R051 Acute cough: Secondary | ICD-10-CM | POA: Insufficient documentation

## 2024-06-24 DIAGNOSIS — J40 Bronchitis, not specified as acute or chronic: Secondary | ICD-10-CM | POA: Insufficient documentation

## 2024-06-24 LAB — POCT RAPID SARS-COV-2 ANTIGEN (AMB ONLY): COVID-19 AG: NEGATIVE

## 2024-06-24 LAB — POCT RAPID STREP A: RAPID STREP A (POCT): NEGATIVE

## 2024-06-24 MED ORDER — ALBUTEROL SULFATE HFA 90 MCG/ACTUATION AEROSOL INHALER
1.0000 | INHALATION_SPRAY | Freq: Four times a day (QID) | RESPIRATORY_TRACT | 0 refills | Status: DC | PRN
Start: 2024-06-24 — End: 2024-09-21

## 2024-06-24 MED ORDER — DOXYCYCLINE HYCLATE 100 MG CAPSULE
100.0000 mg | ORAL_CAPSULE | Freq: Two times a day (BID) | ORAL | 0 refills | Status: DC
Start: 2024-06-24 — End: 2024-06-24

## 2024-06-24 MED ORDER — ALBUTEROL SULFATE HFA 90 MCG/ACTUATION AEROSOL INHALER
1.0000 | INHALATION_SPRAY | Freq: Four times a day (QID) | RESPIRATORY_TRACT | 0 refills | Status: DC | PRN
Start: 2024-06-24 — End: 2024-06-24

## 2024-06-24 MED ORDER — DOXYCYCLINE HYCLATE 100 MG CAPSULE
100.0000 mg | ORAL_CAPSULE | Freq: Two times a day (BID) | ORAL | 0 refills | Status: AC
Start: 2024-06-24 — End: 2024-07-01

## 2024-06-24 NOTE — Nursing Note (Signed)
 06/24/24 1800   Rapid Strep   Rapid Strep Negative   Throat culture sent No   Initials MG   Lot# [id number]   Expiration Date 11/06/25   Internal Control Valid yes       POCT Covid  Lot# [id number] AY  Exp 05/15/25  Result: Negative

## 2024-06-24 NOTE — Progress Notes (Signed)
 [facility]  [address]  [place] [address]  Operated by [facility]  Progress Note    Name: Monique [name] MRN:  [id number]   Date: 06/24/2024 DOB:  05/29/1966 (58 y.o.)           Assessment   Problem List Items Addressed This Visit    None  Visit Diagnoses         Acute cough    -  Primary    Relevant Orders    POCT Rapid Covid-19 Antigen (AMB ONLY) (Completed)    POCT RAPID STREP A (Completed)      Bronchitis              Plan   Rapid strep and COVID negative.  Patient likely experiencing unspecified viral respiratory infection that has triggered symptoms.  No reported diagnosis of Chronic Obstructive Pulmonary Disease but is current smoker and experiencing some mild bilateral wheezes.  However patient reports poor tolerance of steroid in the past with severe elevations of blood glucose to the 400s and would prefer to avoid them.  Sent albuterol  inhaler and doxycycline  for coverage given copious mucus production and smoking history.  Also sent cough suppressant and counseled to seek re-evaluation for worsening symptoms.    Terre Sharps, MD     History of present Illness:  Monique [name] is an 58 y.o. female presenting for evaluation of cough, congestion and sore throat for roughly the last 2-3 days.  Notes some itching in the ears.  Afebrile.  No reported shortness of breath.  Notes history of smoking but no prior diagnosis of Chronic Obstructive Pulmonary Disease.    Subjective   Chief Complaint   Patient presents with    Congestion     Patient c/o bilateral ear discomfort and itching, chest congestion, non productive cough, and sore throat.      Review of Systems:  ROS negative except noted in HPI     Objective   Vitals:    06/24/24 1748   BP: 120/76   Pulse: 77   Resp: 20   Temp: 36.3 C (97.3 F)   SpO2: 99%   Weight: 93.1 kg (205 lb 3.2 oz)   Height: 1.676 m (5' 6)   BMI: 33.12      Physical Exam  Constitutional:       General: She is not in acute  distress.     Appearance: She is not toxic-appearing.   HENT:      Mouth/Throat:      Comments: Nonerythematous oropharynx with no exudates  Cardiovascular:      Rate and Rhythm: Normal rate.   Pulmonary:      Effort: Pulmonary effort is normal. No respiratory distress.      Comments: Trace wheezes bilaterally  Skin:     General: Skin is warm and dry.   Neurological:      Mental Status: She is alert.   Psychiatric:         Mood and Affect: Mood normal.          There are no Patient Instructions on file for this visit.

## 2024-07-02 ENCOUNTER — Other Ambulatory Visit (INDEPENDENT_AMBULATORY_CARE_PROVIDER_SITE_OTHER): Payer: Self-pay | Admitting: Family Medicine

## 2024-07-02 DIAGNOSIS — R110 Nausea: Secondary | ICD-10-CM

## 2024-07-04 NOTE — Telephone Encounter (Signed)
 Last scheduled appointment with you was 05/05/2024.  Currently scheduled next future appointment with you is 10/25/2024  The next office visit in this department is 10/25/2024    Harlene Antonio, LPN  2/71/7974, 07:57

## 2024-07-25 ENCOUNTER — Ambulatory Visit: Admitting: Podiatry

## 2024-08-03 ENCOUNTER — Encounter (INDEPENDENT_AMBULATORY_CARE_PROVIDER_SITE_OTHER): Payer: Self-pay

## 2024-08-03 ENCOUNTER — Ambulatory Visit

## 2024-08-03 ENCOUNTER — Other Ambulatory Visit: Payer: Self-pay

## 2024-08-03 VITALS — BP 124/68 | HR 69 | Temp 97.9°F | Resp 18 | Ht 66.0 in | Wt 193.9 lb

## 2024-08-03 DIAGNOSIS — H66002 Acute suppurative otitis media without spontaneous rupture of ear drum, left ear: Secondary | ICD-10-CM | POA: Insufficient documentation

## 2024-08-03 DIAGNOSIS — J069 Acute upper respiratory infection, unspecified: Secondary | ICD-10-CM | POA: Insufficient documentation

## 2024-08-03 MED ORDER — AMOXICILLIN 875 MG-POTASSIUM CLAVULANATE 125 MG TABLET
1.0000 | ORAL_TABLET | Freq: Two times a day (BID) | ORAL | 0 refills | Status: AC
Start: 2024-08-03 — End: 2024-08-10

## 2024-08-03 MED ORDER — FEXOFENADINE 180 MG TABLET
180.0000 mg | ORAL_TABLET | Freq: Every day | ORAL | 0 refills | Status: AC
Start: 2024-08-03 — End: 2024-08-10

## 2024-08-03 NOTE — Progress Notes (Signed)
 [facility], [facility]  [address]  [place] [address]  Operated by [facility]  History and Physical    Name: Monique [name] MRN:  [id number]   Date: 08/03/2024 DOB:  [birth year] (58 y.o.)              Subjective:     Chief Complaint:    Chief Complaint   Patient presents with    Sore Throat     Onset 3 days    Headache     Onset 3 days, front of head, once in a while I get sharp pains,    Runny Nose     Onset 3 days, clear drainage       HPI:  Monique [name] is a 58 y.o. y/o female who presents today for sore throat, headache, runny nose, congestion, and left ear pain x3 days.  Denies any alleviating factors.  Denies any known sick contacts.  Denies fevers, chills, wheezing, or shortness of breath.    Past Medical History:   Diagnosis Date    Anxiety     Arthritis     Back problem     DDD    Beta blocker prescribed for left ventricular systolic dysfunction     Blood thinned due to long-term anticoagulant use     Carotid stenosis     occlusion of left ICA    Chest pain 04/03/2018    Chronic back pain     Chronic obstructive pulmonary disease, unspecified COPD type (CMS HCC) 12/31/2022    Coronary artery disease involving native coronary artery of native heart     CVA (cerebral vascular accident)     DDD (degenerative disc disease), lumbar     entire spine    Ear piercing     GERD (gastroesophageal reflux disease)     controlled with omeprazole     H/O complete eye exam     2 years, Dr. Gino, at [facility]    History of dental examination     dentures upper and lower    HTN     Hx of coronary artery bypass graft     2012    Hx of gastric bypass     Hypercholesterolemia     Hyperlipidemia     Hypothyroid     MI (myocardial infarction) 2012    Neck problem     herniated cervical disc    Obesity (BMI 30-39.9)     Peripheral vascular disease, unspecified (CMS HCC) 01/03/2021    Solitary nodule of right lobe of thyroid  03/30/2018    Incidental finding on MRI thoracic spine. US   ordered.     Stented coronary artery     x4    Stroke     2002 and again 2025    Stroke 01/05/2024    treated at [facility] in [place], [place]     Tattoo     Left breast, Right shoulder    Thyroid  disorder     Thyroid  nodule     Type 2 diabetes mellitus     HGA1C 10.4 on 12/19/21    Uncontrolled type 2 diabetes mellitus, without long-term current use of insulin      Xanthelasma           Past Surgical History:   Procedure Laterality Date    ENDOMETRIAL ABLATION  1998    HX ANKLE FRACTURE TX  1990s    Right, Margarito Molt procedure    HX CATARACT REMOVAL Bilateral  2013    r and l eyes with implants    HX CESAREAN SECTION  06/20/2000    x3, 11/21/86, 11/10/84    HX CHOLECYSTECTOMY  1988    HX CORONARY ARTERY BYPASS GRAFT  03/31/2011    cardiac, 2 vessel, Dr. Quintin, [facility]    HX CORONARY STENT PLACEMENT  2012    x4 stents    HX GASTRIC SLEEVE  02/2017    [place], [place], Dr. Feliciana JOY HEART CATHETERIZATION  2012    massive heart attack and stents - [facility]    HX THYROID  BIOPSY      HX THYROIDECTOMY  101/01/2018    HX TONSILLECTOMY      as child    HX YAG Left 09/20/2013            Current Outpatient Medications   Medication Sig    albuterol  sulfate (PROVENTIL  OR VENTOLIN  OR PROAIR ) 90 mcg/actuation Inhalation oral inhaler Take 1-2 Puffs by inhalation Every 6 hours as needed (Patient not taking: Reported on 08/03/2024)    amoxicillin -pot clavulanate (AUGMENTIN ) 875-125 mg Oral Tablet Take 1 Tablet by mouth Twice daily for 7 days    aspirin  (ECOTRIN) 81 mg Oral Tablet, Delayed Release (E.C.) Take 1 Tablet (81 mg total) by mouth Daily    BD PRECISIONGLIDE 25 gauge x 1 Needle USE TO INJECT EVERY 30 DAYS    Blood Sugar Diagnostic (ACCU-CHEK GUIDE TEST STRIPS) Does not apply Strip TEST BLOOD SUGAR THREE TIMES DAILY    Blood-Glucose Meter (ACCU-CHEK GUIDE GLUCOSE METER) Misc USE AS DIRECTED    clopidogreL  (PLAVIX ) 75 mg Oral Tablet TAKE 1 TABLET EVERY DAY    cyanocobalamin  (VITAMIN B12) 1,000 mcg/mL Injection  Solution INJECT 1 ML (1,000 MCG TOTAL) UNDER THE SKIN EVERY 30 DAYS    docusate sodium  (COLACE) 100 mg Oral Capsule TAKE 1 CAPSULE TWICE DAILY    empagliflozin  (JARDIANCE ) 10 mg Oral Tablet Take 1 Tablet (10 mg total) by mouth Once a day    ergocalciferol , vitamin D2, (DRISDOL ) 1,250 mcg (50,000 unit) Oral Capsule TAKE 1 CAPSULE BY MOUTH EVERY WEEK    ezetimibe  (ZETIA ) 10 mg Oral Tablet TAKE ONE TABLET BY MOUTH EVERY EVENING    fexofenadine  (ALLEGRA ) 180 mg Oral Tablet Take 1 Tablet (180 mg total) by mouth Daily for 7 days    furosemide  (LASIX ) 40 mg Oral Tablet TAKE 1 TABLET EVERY DAY    HYDROcodone -acetaminophen  (NORCO) 10-325 mg Oral Tablet Take 1 Tablet by mouth Every 8 hours as needed for Pain Etta Sharps with [facility]    Ibuprofen  (MOTRIN ) 800 mg Oral Tablet TAKE ONE TABLET BY MOUTH THREE TIMES DAILY AS NEEDED FOR PAIN WITH FOOD    Lancets (ACCU-CHEK SOFTCLIX LANCETS) Misc TEST BLOOD SUGAR THREE TIMES DAILY AS DIRECTED    levothyroxine  (SYNTHROID ) 25 mcg Oral Tablet TAKE 1 TABLET ONE TIME DAILY    metoprolol  succinate (TOPROL -XL) 25 mg Oral Tablet Sustained Release 24 hr Take 0.5 Tablets (12.5 mg total) by mouth Once a day    NARCAN  4 mg/actuation Nasal Spray, Non-Aerosol PT STATED SHE HAS NARCAN  AT HOME BUT IS NOT TAKING IT (Patient taking differently: PT STATED SHE HAS NARCAN  AT HOME BUT IS NOT TAKING IT)    niacin  (NIASPAN ) 500 mg Oral Tablet Sustained Release 24 hr TAKE 1 TABLET EVERY DAY    nitroGLYCERIN  (NITROSTAT ) 0.4 mg Sublingual Tablet, Sublingual Place 1 Tablet (0.4 mg total) under the tongue Every 5 minutes as needed for  Chest pain for 3 doses over 15 minutes    omeprazole  (PRILOSEC) 40 mg Oral Capsule, Delayed Release(E.C.) TAKE 1 CAPSULE EVERY DAY    ondansetron  (ZOFRAN  ODT) 4 mg Oral Tablet, Rapid Dissolve Take 1 Tablet (4 mg total) by mouth Every 8 hours as needed for Nausea/Vomiting    ondansetron  (ZOFRAN  ODT) 8 mg Oral Tablet, Rapid Dissolve DISSOLVE 1 TABLET ON THE  TONGUE EVERY 8 HOURS AS NEEDED FOR NAUSEA AND VOMITING    OZEMPIC  2 mg/dose (8 mg/3 mL) Subcutaneous Pen Injector Inject 2 mg under the skin Every 7 days    pioglitazone  (ACTOS ) 15 mg Oral Tablet Take 1 Tablet (15 mg total) by mouth Daily    PRENATAL VITAMIN PLUS LOW IRON  27 mg iron - 1 mg Oral Tablet TAKE 1 TABLET EVERY DAY    venlafaxine  (EFFEXOR  XR) 37.5 mg Oral Capsule, Sust. Release 24 hr TAKE 1 CAPSULE ONE TIME DAILY    ZYRTEC -D 5-120 mg Oral Tablet Sustained Release 12 hr Take 1 Tablet by mouth Once a day      Allergies[1]     ROS:  ROS negative unless otherwise noted in HPI.    Objective:   Physical Exam  Vitals and nursing note reviewed.   Constitutional:       General: She is not in acute distress.     Appearance: Normal appearance. She is not ill-appearing.   HENT:      Head: Normocephalic.      Right Ear: Tympanic membrane, ear canal and external ear normal.      Left Ear: Ear canal and external ear normal. A middle ear effusion is present. Tympanic membrane is erythematous.      Nose: Congestion and rhinorrhea present.   Cardiovascular:      Rate and Rhythm: Normal rate and regular rhythm.      Heart sounds: Normal heart sounds.   Pulmonary:      Effort: Pulmonary effort is normal.      Breath sounds: Normal breath sounds. No stridor. No wheezing, rhonchi or rales.   Lymphadenopathy:      Cervical: No cervical adenopathy.   Skin:     General: Skin is warm and dry.      Capillary Refill: Capillary refill takes less than 2 seconds.   Neurological:      General: No focal deficit present.      Mental Status: She is alert and oriented to person, place, and time.   Psychiatric:         Mood and Affect: Mood normal.         Behavior: Behavior normal.         Vitals:    08/03/24 1355   BP: 124/68   Pulse: 69   Resp: 18   Temp: 36.6 C (97.9 F)   TempSrc: Tympanic   SpO2: 97%   Weight: 88 kg (193 lb 14.4 oz)   Height: 1.676 m (5' 6)   BMI: 31.3       Assessment & Plan:       ICD-10-CM    1. Upper respiratory  tract infection, unspecified type  J06.9 fexofenadine  (ALLEGRA ) 180 mg Oral Tablet      2. Non-recurrent acute suppurative otitis media of left ear without spontaneous rupture of tympanic membrane  H66.002 amoxicillin -pot clavulanate (AUGMENTIN ) 875-125 mg Oral Tablet           Orders Placed This Encounter    amoxicillin -pot clavulanate (AUGMENTIN ) 875-125 mg Oral Tablet  fexofenadine  (ALLEGRA ) 180 mg Oral Tablet      Follow up with PCP as directed. For new or worsening symptoms, evaluation at your nearest Emergency Department may be needed.    Darlyn ONEIDA Reef, APRN     I saw this patient independently.   This note was partially generated using MModal Fluency Direct system, and there may be some incorrect words, spellings, and punctuation that were not noted in checking the note before saving.             [1]   Allergies  Allergen Reactions    Capron Dm [Pyrilamine -Dextromethorphan ]  Other Adverse Reaction (Add comment)     LIGHT HEADED    Other      Coban dressing caused skin break down    Tylenol  W Codeine  [Acetaminophen -Codeine ]  Other Adverse Reaction (Add comment)     pt states that one time she had chest pains with this med, but dr. thought it was because she had not eaten

## 2024-08-15 ENCOUNTER — Ambulatory Visit (INDEPENDENT_AMBULATORY_CARE_PROVIDER_SITE_OTHER): Payer: Self-pay | Admitting: Foot Surgery

## 2024-08-15 ENCOUNTER — Telehealth: Payer: Self-pay

## 2024-08-15 NOTE — Telephone Encounter (Signed)
 Attempt to do a PA for the pt's Rabeprzole Sodium 20mg  tab. Message came back stating that the pt needed to contact there insurance. Message sent in MyChart to the pt regarding this so a PA can be finished for the pt. Pt Dx code is K21.9 and K30. Pt has tried / failed: Dexlanxoprazole, Omeprazole  20mg , Pantoprazole40 mg. Waiting on reply from the pt

## 2024-08-15 NOTE — Progress Notes (Deleted)
 [facility]  [address]  [place] [address]  Operated by [facility]     Name: Monique [name] MRN:  [id number]   Date: 08/15/2024 Age: 58 y.o.     PCP: Randine Hull, MD    Chief Complaint: No chief complaint on file.      HPI: Monique [name] is a 58 y.o. female presenting for ***    Lab Results   Component Value Date    HA1C 7.8 (H) 03/03/2024      POCT HGB A1C   Date Value Ref Range Status   07/04/2020 9.9 (A) 4 - 6 % Final        Review of Systems:  Constitutional: No fever, malaise, or weight loss.  Skin: ***No rashes, lesions, itching.  HENT: No frequent or significant headaches.   Cardio: No chest pain, palpitations, or leg swelling.   Respiratory: No cough, wheezing, SOB.  GI: No abdominal pain.  GU: No dysuria, hematuria, polyuria.  MSK: ***No back pain.  Neuro: No numbness, tingling, or burning in feet.   All other systems reviewed and are negative, unless commented on in the HPI.      Past Medical History:  Past Medical History:   Diagnosis Date    Anxiety     Arthritis     Back problem     DDD    Beta blocker prescribed for left ventricular systolic dysfunction     Blood thinned due to long-term anticoagulant use     Carotid stenosis     occlusion of left ICA    Chest pain 04/03/2018    Chronic back pain     Chronic obstructive pulmonary disease, unspecified COPD type (CMS HCC) 12/31/2022    Coronary artery disease involving native coronary artery of native heart     CVA (cerebral vascular accident)     DDD (degenerative disc disease), lumbar     entire spine    Ear piercing     GERD (gastroesophageal reflux disease)     controlled with omeprazole     H/O complete eye exam     2 years, Dr. Gino, at [facility]    History of dental examination     dentures upper and lower    HTN     Hx of coronary artery bypass graft     2012    Hx of gastric bypass     Hypercholesterolemia     Hyperlipidemia     Hypothyroid     MI (myocardial infarction) 2012    Neck  problem     herniated cervical disc    Obesity (BMI 30-39.9)     Peripheral vascular disease, unspecified (CMS HCC) 01/03/2021    Solitary nodule of right lobe of thyroid  03/30/2018    Incidental finding on MRI thoracic spine. US  ordered.     Stented coronary artery     x4    Stroke     2002 and again 2025    Stroke 01/05/2024    treated at [facility] in [place],      Tattoo     Left breast, Right shoulder    Thyroid  disorder     Thyroid  nodule     Type 2 diabetes mellitus     HGA1C 10.4 on 12/19/21    Uncontrolled type 2 diabetes mellitus, without long-term current use of insulin      Xanthelasma          Past Surgical History:  Past Surgical History:   Procedure Laterality Date    Endometrial ablation  1998    Hx ankle fracture tx  1990s    Hx cataract removal Bilateral 2013    Hx cesarean section  06/20/2000    Hx cholecystectomy  1988    Hx coronary artery bypass graft  03/31/2011    Hx coronary stent placement  2012    Hx gastric sleeve  02/2017    Hx heart catheterization  2012    Hx thyroid  biopsy      Hx thyroidectomy  101/01/2018    Hx tonsillectomy      Hx yag Left 09/20/2013     Allergies:  Allergies[1]  Medications:  Current Outpatient Medications   Medication Sig    albuterol  sulfate (PROVENTIL  OR VENTOLIN  OR PROAIR ) 90 mcg/actuation Inhalation oral inhaler Take 1-2 Puffs by inhalation Every 6 hours as needed (Patient not taking: Reported on 08/03/2024)    aspirin  (ECOTRIN) 81 mg Oral Tablet, Delayed Release (E.C.) Take 1 Tablet (81 mg total) by mouth Daily    BD PRECISIONGLIDE 25 gauge x 1 Needle USE TO INJECT EVERY 30 DAYS    Blood Sugar Diagnostic (ACCU-CHEK GUIDE TEST STRIPS) Does not apply Strip TEST BLOOD SUGAR THREE TIMES DAILY    Blood-Glucose Meter (ACCU-CHEK GUIDE GLUCOSE METER) Misc USE AS DIRECTED    clopidogreL  (PLAVIX ) 75 mg Oral Tablet TAKE 1 TABLET EVERY DAY    cyanocobalamin  (VITAMIN B12) 1,000 mcg/mL Injection Solution INJECT 1 ML (1,000 MCG TOTAL) UNDER THE SKIN EVERY 30  DAYS    docusate sodium  (COLACE) 100 mg Oral Capsule TAKE 1 CAPSULE TWICE DAILY    empagliflozin  (JARDIANCE ) 10 mg Oral Tablet Take 1 Tablet (10 mg total) by mouth Once a day    ergocalciferol , vitamin D2, (DRISDOL ) 1,250 mcg (50,000 unit) Oral Capsule TAKE 1 CAPSULE BY MOUTH EVERY WEEK    ezetimibe  (ZETIA ) 10 mg Oral Tablet TAKE ONE TABLET BY MOUTH EVERY EVENING    furosemide  (LASIX ) 40 mg Oral Tablet TAKE 1 TABLET EVERY DAY    HYDROcodone -acetaminophen  (NORCO) 10-325 mg Oral Tablet Take 1 Tablet by mouth Every 8 hours as needed for Pain Monique [name] with [facility]    Ibuprofen  (MOTRIN ) 800 mg Oral Tablet TAKE ONE TABLET BY MOUTH THREE TIMES DAILY AS NEEDED FOR PAIN WITH FOOD    Lancets (ACCU-CHEK SOFTCLIX LANCETS) Misc TEST BLOOD SUGAR THREE TIMES DAILY AS DIRECTED    levothyroxine  (SYNTHROID ) 25 mcg Oral Tablet TAKE 1 TABLET ONE TIME DAILY    metoprolol  succinate (TOPROL -XL) 25 mg Oral Tablet Sustained Release 24 hr Take 0.5 Tablets (12.5 mg total) by mouth Once a day    NARCAN  4 mg/actuation Nasal Spray, Non-Aerosol PT STATED SHE HAS NARCAN  AT HOME BUT IS NOT TAKING IT    niacin  (NIASPAN ) 500 mg Oral Tablet Sustained Release 24 hr TAKE 1 TABLET EVERY DAY    nitroGLYCERIN  (NITROSTAT ) 0.4 mg Sublingual Tablet, Sublingual Place 1 Tablet (0.4 mg total) under the tongue Every 5 minutes as needed for Chest pain for 3 doses over 15 minutes    omeprazole  (PRILOSEC) 40 mg Oral Capsule, Delayed Release(E.C.) TAKE 1 CAPSULE EVERY DAY    ondansetron  (ZOFRAN  ODT) 4 mg Oral Tablet, Rapid Dissolve Take 1 Tablet (4 mg total) by mouth Every 8 hours as needed for Nausea/Vomiting    ondansetron  (ZOFRAN  ODT) 8 mg Oral Tablet, Rapid Dissolve DISSOLVE 1 TABLET ON THE TONGUE EVERY 8 HOURS AS NEEDED FOR NAUSEA AND VOMITING  OZEMPIC  2 mg/dose (8 mg/3 mL) Subcutaneous Pen Injector Inject 2 mg under the skin Every 7 days    pioglitazone  (ACTOS ) 15 mg Oral Tablet Take 1 Tablet (15 mg total) by mouth Daily    PRENATAL  VITAMIN PLUS LOW IRON  27 mg iron - 1 mg Oral Tablet TAKE 1 TABLET EVERY DAY    venlafaxine  (EFFEXOR  XR) 37.5 mg Oral Capsule, Sust. Release 24 hr TAKE 1 CAPSULE ONE TIME DAILY    ZYRTEC -D 5-120 mg Oral Tablet Sustained Release 12 hr Take 1 Tablet by mouth Once a day     Family History:  Family Medical History:       Problem Relation (Age of Onset)    Atrial fibrillation Sister    Colon Cancer Father    Diabetes Mother, Maternal Aunt    Heart Disease Sister, Maternal Grandfather    Hypertension (High Blood Pressure) Mother    Lung Cancer Maternal Grandfather    MI <36 years of age Mother            Social History:  Social History     Socioeconomic History    Marital status: Married     Spouse name: Darina    Number of children: 3    Years of education: completed 11th grade   Occupational History    Occupation: disabled   Tobacco Use    Smoking status: Every Day     Current packs/day: 0.25     Average packs/day: 0.5 packs/day for 44.7 years (21.9 ttl pk-yrs)     Types: Cigarettes     Start date: 81    Smokeless tobacco: Never   Vaping Use    Vaping status: Never Used   Substance and Sexual Activity    Alcohol use: No    Drug use: No    Sexual activity: Yes     Partners: Male   Other Topics Concern    Ability to Walk 1 Flight of Steps without SOB/CP Yes    Routine Exercise No    Ability to Walk 2 Flight of Steps without SOB/CP Yes    Ability To Do Own ADL's Yes    Uses Walker No    Other Activity Level Yes     Comment: on the go all the time - does cleaning, grocery shopping    Uses Cane No   Social History Narrative    Living situation: lives at home with husband, Darina, and daughter.  1 dog named Duke and 1 cat named Lucky.    Nutrition:  Eats 5 small meals d/t gastric sleeve surgery.     Caffeine use: none, decaf coffee    Exercise: stays busy around house. No formal form of exercise    Seatbelt use: sometimes    Fire extinguishers in the home: yes    Smoke alarms in the home: yes    Carbon monoxide detectors in the  home: yes    Monique [name]: sunglasses        Monique [name] would like for husband, Monique [name],  to speak for her in the event she would become incapacitated.    Full code.      Social Determinants of Health     Transportation Needs: No Transportation Needs (12/26/2023)    Received from [facility]    PRAPARE - Transportation     Lack of Transportation (Medical): No     Lack of Transportation (Non-Medical): No   Social Connections: Medium Risk (02/17/2023)    Social Connections  SDOH Social Isolation: 3 to 5 times a week   Intimate Partner Violence: Not At Risk (12/26/2023)    Received from [employer], Afraid, Rape, and Kick questionnaire     Fear of Current or Ex-Partner: No     Emotionally Abused: No     Physically Abused: No     Sexually Abused: No   Housing Stability: Low Risk  (12/26/2023)    Received from [employer] Stability Vital Sign     Unable to Pay for Housing in the Last Year: No     Number of Times Moved in the Last Year: 0     Homeless in the Last Year: No       Physical Exam:  Patient is a 58 y.o. female who appears well developed, well nourished and with good attention to hygiene and body habitus.     Vitals:  There were no vitals filed for this visit.     Vascular:   Dorsalis pedis and posterior tibial pulses palpable bilaterally.  Capillary Fill time < 3 seconds to digits 1-5 bilaterally.  Skin temperature warm to warm tibial tuberosity to the digits bilaterally.  Hair growth present.  Edema noted bilaterally   No varicosities.    Neurological:   Intact vibratory sensation at the hallux IPJ bilaterally.  Intact protective sensation bilateral.    Dermatological:   Skin appears well hydrated and supple.   Good turgor.   No open lesions present.   No callosities present.   Webspaces clean and dry 1-4 bilaterally.   Nails 1-5 bilaterally appear normal.    Musculoskeletal/Orthopaedic:   Flexible hammertoe 4th right  Structurally within normal limits.  +5/5 muscle strength  Dorsiflexion, Plantarflexion, Inversion, Eversion bilaterally.  ROM of the 1st MTP joint is full bilaterally.    ROM of the MTJ/STJ is full without pain or crepitus bilaterally.    Ankle joint ROM is decreased bilaterally.    Imaging:  No imaging  Procedures     Assessment:  No diagnosis found.     Plan:   A comprehensive history and physical examination were preformed. The patient was educated on clinical and radiographic findings, diagnosis and treatment plans. Patient state that she understands all that has been explained and all questions were answered to her apparent satisfaction.   ***    Follow-up:  No follow-ups on file.    Monique [name], DPM          [1]   Allergies  Allergen Reactions    Capron Dm [Pyrilamine -Dextromethorphan ]  Other Adverse Reaction (Add comment)     LIGHT HEADED    Other      Coban dressing caused skin break down    Tylenol  W Codeine  [Acetaminophen -Codeine ]  Other Adverse Reaction (Add comment)     pt states that one time she had chest pains with this med, but dr. thought it was because she had not eaten

## 2024-08-22 ENCOUNTER — Ambulatory Visit: Payer: Self-pay | Attending: Foot Surgery | Admitting: Foot Surgery

## 2024-08-22 ENCOUNTER — Other Ambulatory Visit: Payer: Self-pay

## 2024-08-22 ENCOUNTER — Encounter (INDEPENDENT_AMBULATORY_CARE_PROVIDER_SITE_OTHER): Payer: Self-pay | Admitting: Foot Surgery

## 2024-08-22 VITALS — BP 124/64 | Ht 65.98 in | Wt 201.0 lb

## 2024-08-22 DIAGNOSIS — B351 Tinea unguium: Secondary | ICD-10-CM | POA: Insufficient documentation

## 2024-08-22 DIAGNOSIS — M79675 Pain in left toe(s): Secondary | ICD-10-CM | POA: Insufficient documentation

## 2024-08-22 DIAGNOSIS — E1100 Type 2 diabetes mellitus with hyperosmolarity without nonketotic hyperglycemic-hyperosmolar coma (NKHHC): Secondary | ICD-10-CM | POA: Insufficient documentation

## 2024-08-22 DIAGNOSIS — M79674 Pain in right toe(s): Secondary | ICD-10-CM | POA: Insufficient documentation

## 2024-08-22 NOTE — Progress Notes (Signed)
 [name]  [address]  [place] [address]  Operated by [name]     Name: Monique [name] MRN:  [id number]   Date: 08/22/2024 Age: 58 y.o.     PCP: Monique Hull, MD    Chief Complaint: Diabetes Foot Check (New to you- DFC/Last trim: 12/21/23 PCP:05/05/24 A1C:7.8% on 03/03/24 Pain:0/10 FWB in tennis shoes. (Pt gave warning that she has supe-sensitive feet))      HPI: Monique [name] is a 58 y.o. female presenting for diabetic foot care    Lab Results   Component Value Date    HA1C 7.8 (H) 03/03/2024      POCT HGB A1C   Date Value Ref Range Status   07/04/2020 9.9 (A) 4 - 6 % Final        Review of Systems:  Constitutional: No fever, malaise, or weight loss.  Skin: No rashes, lesions, itching.  HENT: No frequent or significant headaches.   Cardio: No chest pain, palpitations, or leg swelling.   Respiratory: No cough, wheezing, SOB.  GI: No abdominal pain.  GU: No dysuria, hematuria, polyuria.  MSK:  Chronic back pain.  Neuro: No numbness, tingling, or burning in feet.   All other systems reviewed and are negative, unless commented on in the HPI.      Past Medical History:  Past Medical History:   Diagnosis Date    Anxiety     Arthritis     Back problem     DDD    Beta blocker prescribed for left ventricular systolic dysfunction     Blood thinned due to long-term anticoagulant use     Carotid stenosis     occlusion of left ICA    Chest pain 04/03/2018    Chronic back pain     Chronic obstructive pulmonary disease, unspecified COPD type (CMS HCC) 12/31/2022    Coronary artery disease involving native coronary artery of native heart     CVA (cerebral vascular accident)     DDD (degenerative disc disease), lumbar     entire spine    Ear piercing     GERD (gastroesophageal reflux disease)     controlled with omeprazole     H/O complete eye exam     2 years, Dr. Gino, at [name]    History of dental examination     dentures upper and lower    HTN     Hx of  coronary artery bypass graft     2012    Hx of gastric bypass     Hypercholesterolemia     Hyperlipidemia     Hypothyroid     MI (myocardial infarction) 2012    Neck problem     herniated cervical disc    Obesity (BMI 30-39.9)     Peripheral vascular disease, unspecified (CMS HCC) 01/03/2021    Solitary nodule of right lobe of thyroid  03/30/2018    Incidental finding on MRI thoracic spine. US  ordered.     Stented coronary artery     x4    Stroke     2002 and again 2025    Stroke 01/05/2024    treated at [facility] in [place], [place]     Tattoo     Left breast, Right shoulder    Thyroid  disorder     Thyroid  nodule     Type 2 diabetes mellitus     HGA1C 10.4 on 12/19/21    Uncontrolled type 2 diabetes mellitus, without  long-term current use of insulin      Xanthelasma          Past Surgical History:   Past Surgical History:   Procedure Laterality Date    Endometrial ablation  1998    Hx ankle fracture tx  1990s    Hx cataract removal Bilateral 2013    Hx cesarean section  06/20/2000    Hx cholecystectomy  1988    Hx coronary artery bypass graft  03/31/2011    Hx coronary stent placement  2012    Hx gastric sleeve  02/2017    Hx heart catheterization  2012    Hx thyroid  biopsy      Hx thyroidectomy  101/01/2018    Hx tonsillectomy      Hx yag Left 09/20/2013     Allergies:  Allergies[1]  Medications:  Current Outpatient Medications   Medication Sig    albuterol  sulfate (PROVENTIL  OR VENTOLIN  OR PROAIR ) 90 mcg/actuation Inhalation oral inhaler Take 1-2 Puffs by inhalation Every 6 hours as needed    aspirin  (ECOTRIN) 81 mg Oral Tablet, Delayed Release (E.C.) Take 1 Tablet (81 mg total) by mouth Daily    BD PRECISIONGLIDE 25 gauge x 1 Needle USE TO INJECT EVERY 30 DAYS    Blood Sugar Diagnostic (ACCU-CHEK GUIDE TEST STRIPS) Does not apply Strip TEST BLOOD SUGAR THREE TIMES DAILY    Blood-Glucose Meter (ACCU-CHEK GUIDE GLUCOSE METER) Misc USE AS DIRECTED    clopidogreL  (PLAVIX ) 75 mg Oral Tablet TAKE 1 TABLET EVERY DAY     cyanocobalamin  (VITAMIN B12) 1,000 mcg/mL Injection Solution INJECT 1 ML (1,000 MCG TOTAL) UNDER THE SKIN EVERY 30 DAYS    docusate sodium  (COLACE) 100 mg Oral Capsule TAKE 1 CAPSULE TWICE DAILY    empagliflozin  (JARDIANCE ) 10 mg Oral Tablet Take 1 Tablet (10 mg total) by mouth Once a day    ergocalciferol , vitamin D2, (DRISDOL ) 1,250 mcg (50,000 unit) Oral Capsule TAKE 1 CAPSULE BY MOUTH EVERY WEEK    ezetimibe  (ZETIA ) 10 mg Oral Tablet TAKE ONE TABLET BY MOUTH EVERY EVENING    furosemide  (LASIX ) 40 mg Oral Tablet TAKE 1 TABLET EVERY DAY    HYDROcodone -acetaminophen  (NORCO) 10-325 mg Oral Tablet Take 1 Tablet by mouth Every 8 hours as needed for Pain [name]    Ibuprofen  (MOTRIN ) 800 mg Oral Tablet TAKE ONE TABLET BY MOUTH THREE TIMES DAILY AS NEEDED FOR PAIN WITH FOOD    Lancets (ACCU-CHEK SOFTCLIX LANCETS) Misc TEST BLOOD SUGAR THREE TIMES DAILY AS DIRECTED    levothyroxine  (SYNTHROID ) 25 mcg Oral Tablet TAKE 1 TABLET ONE TIME DAILY    metoprolol  succinate (TOPROL -XL) 25 mg Oral Tablet Sustained Release 24 hr Take 0.5 Tablets (12.5 mg total) by mouth Once a day    NARCAN  4 mg/actuation Nasal Spray, Non-Aerosol PT STATED SHE HAS NARCAN  AT HOME BUT IS NOT TAKING IT    niacin  (NIASPAN ) 500 mg Oral Tablet Sustained Release 24 hr TAKE 1 TABLET EVERY DAY    nitroGLYCERIN  (NITROSTAT ) 0.4 mg Sublingual Tablet, Sublingual Place 1 Tablet (0.4 mg total) under the tongue Every 5 minutes as needed for Chest pain for 3 doses over 15 minutes    omeprazole  (PRILOSEC) 40 mg Oral Capsule, Delayed Release(E.C.) TAKE 1 CAPSULE EVERY DAY    ondansetron  (ZOFRAN  ODT) 4 mg Oral Tablet, Rapid Dissolve Take 1 Tablet (4 mg total) by mouth Every 8 hours as needed for Nausea/Vomiting    ondansetron  (ZOFRAN  ODT) 8 mg Oral  Tablet, Rapid Dissolve DISSOLVE 1 TABLET ON THE TONGUE EVERY 8 HOURS AS NEEDED FOR NAUSEA AND VOMITING    OZEMPIC  2 mg/dose (8 mg/3 mL) Subcutaneous Pen Injector Inject 2 mg under  the skin Every 7 days    pioglitazone  (ACTOS ) 15 mg Oral Tablet Take 1 Tablet (15 mg total) by mouth Daily    PRENATAL VITAMIN PLUS LOW IRON  27 mg iron - 1 mg Oral Tablet TAKE 1 TABLET EVERY DAY    venlafaxine  (EFFEXOR  XR) 37.5 mg Oral Capsule, Sust. Release 24 hr TAKE 1 CAPSULE ONE TIME DAILY    ZYRTEC -D 5-120 mg Oral Tablet Sustained Release 12 hr Take 1 Tablet by mouth Once a day     Family History:  Family Medical History:       Problem Relation (Age of Onset)    Atrial fibrillation Sister    Colon Cancer Father    Diabetes Mother, Maternal Aunt    Heart Disease Sister, Maternal Grandfather    Hypertension (High Blood Pressure) Mother    Lung Cancer Maternal Grandfather    MI <78 years of age Mother            Social History:  Social History     Socioeconomic History    Marital status: Married     Spouse name: Darina    Number of children: 3    Years of education: completed 11th grade   Occupational History    Occupation: disabled   Tobacco Use    Smoking status: Every Day     Current packs/day: 0.25     Average packs/day: 0.5 packs/day for 44.7 years (21.9 ttl pk-yrs)     Types: Cigarettes     Start date: 39    Smokeless tobacco: Never   Vaping Use    Vaping status: Never Used   Substance and Sexual Activity    Alcohol use: No    Drug use: No    Sexual activity: Yes     Partners: Male   Other Topics Concern    Ability to Walk 1 Flight of Steps without SOB/CP Yes    Routine Exercise No    Ability to Walk 2 Flight of Steps without SOB/CP Yes    Ability To Do Own ADL's Yes    Uses Walker No    Other Activity Level Yes     Comment: on the go all the time - does cleaning, grocery shopping    Uses Cane No   Social History Narrative    Living situation: lives at home with husband, Darina, and daughter.  1 dog named Duke and 1 cat named Lucky.    Nutrition:  Eats 5 small meals d/t gastric sleeve surgery.     Caffeine use: none, decaf coffee    Exercise: stays busy around house. No formal form of exercise    Seatbelt  use: sometimes    Fire extinguishers in the home: yes    Smoke alarms in the home: yes    Carbon monoxide detectors in the home: yes    Monique [name]: sunglasses        Cary would like for husband, Jameisha Stofko,  to speak for her in the event she would become incapacitated.    Full code.      Social Determinants of Health     Transportation Needs: No Transportation Needs (12/26/2023)    Received from [facility]    PRAPARE - Transportation     Lack of Transportation (Medical): No  Lack of Transportation (Non-Medical): No   Social Connections: Medium Risk (02/17/2023)    Social Connections     SDOH Social Isolation: 3 to 5 times a week   Intimate Partner Violence: Not At Risk (12/26/2023)    Received from [employer], Afraid, Rape, and Kick questionnaire     Within the last year, have you been afraid of your partner or ex-partner?: No     Within the last year, have you been humiliated or emotionally abused in other ways by your partner or ex-partner?: No     Within the last year, have you been kicked, hit, slapped, or otherwise physically hurt by your partner or ex-partner?: No     Within the last year, have you been raped or forced to have any kind of sexual activity by your partner or ex-partner?: No   Housing Stability: Low Risk  (12/26/2023)    Received from [employer] Stability Vital Sign     In the last 12 months, was there a time when you were not able to pay the mortgage or rent on time?: No     In the past 12 months, how many times have you moved where you were living?: 0     At any time in the past 12 months, were you homeless or living in a shelter (including now)?: No       Physical Exam:  Patient is a 58 y.o. female who appears well developed, well nourished and with good attention to hygiene and body habitus.     Vitals:  Vitals:    08/22/24 1509   BP: 124/64   Weight: 91.2 kg (201 lb)   Height: 1.676 m (5' 5.98)   BMI: 32.46      Vascular:   Dorsalis pedis and posterior tibial  pulses palpable bilaterally.  Capillary Fill time < 3 seconds to digits 1-5 bilaterally.  Skin temperature warm to warm tibial tuberosity to the digits bilaterally.  Hair growth present.  Edema noted bilaterally   Varicosities noted bilaterally     Neurological:   Intact vibratory sensation at the hallux IPJ bilaterally.  Intact protective sensation bilateral.     Dermatological:   Skin appears well hydrated and supple.   Good turgor.   No open lesions present.   No callosities present.   Webspaces clean and dry 1-4 bilaterally.   Thickened, yellowish, dystrophic nails 1 through 5 bilaterally.     Musculoskeletal/Orthopaedic:   Stage III HAV deformity bilaterally.  Flexible hammertoe deformity 4th digit bilaterally.    Structurally within normal limits.  +5/5 muscle strength Dorsiflexion, Plantarflexion, Inversion, Eversion bilaterally.  ROM of the 1st MTP joint is full bilaterally.    ROM of the MTJ/STJ is full without pain or crepitus bilaterally.    Ankle joint ROM is decreased bilaterally.    Imaging:  No imaging today  11721 - DEBRIDEMENT OF NAIL BY ANY METHOD; 6 OR MORE (AMB ONLY)    Performed by: Glenis Golas, DPM  Authorized by: Glenis Golas, DPM    Time Out:     Patient verified:  Yes    Procedure Verified:  Yes    Site Verified:  Yes  Documentation:      Debridement of nails 1 through 5 bilaterally utilizing sterile nail nippers and electric grinder reducing fungal load and risk of infection without incident.         Assessment:    ICD-10-CM    1. Onychomycosis  B35.1 11721 - DEBRIDEMENT OF NAIL BY ANY METHOD; 6 OR MORE (AMB ONLY)      2. Pain in toes of both feet  M79.674 11721 - DEBRIDEMENT OF NAIL BY ANY METHOD; 6 OR MORE (AMB ONLY)    M79.675       3. Type 2 diabetes mellitus with hyperosmolarity without coma, without long-term current use of insulin  (CMS HCC)  E11.00 11721 - DEBRIDEMENT OF NAIL BY ANY METHOD; 6 OR MORE (AMB ONLY)           Plan:   A comprehensive history and physical examination were  preformed. The patient was educated on clinical and radiographic findings, diagnosis and treatment plans. Patient state that she understands all that has been explained and all questions were answered to her apparent satisfaction.     Patient was educated on the complications related to diabetes particularly neurological and the effects of neuropathy on pedal health    Discussed guidelines for home preventative foot care and signs and symptoms to watch for.    Advised patient on avoidance of barefoot walking and monitoring feet daily.   Instructed pt to contact our office if any foot problems develop before next visit.     Debridement of nails 1 through 5 bilaterally utilizing sterile nail nippers and electric grinder reducing fungal load and risk of infection without incident.    Follow up 10 weeks      Follow-up:  Return in about 10 weeks (around 10/31/2024) for Diabetic foot care.    Nancyann Running, DPM          [1]   Allergies  Allergen Reactions    Capron Dm [Pyrilamine -Dextromethorphan ]  Other Adverse Reaction (Add comment)     LIGHT HEADED    Other      Coban dressing caused skin break down    Tylenol  W Codeine  [Acetaminophen -Codeine ]  Other Adverse Reaction (Add comment)     pt states that one time she had chest pains with this med, but dr. thought it was because she had not eaten

## 2024-09-07 ENCOUNTER — Other Ambulatory Visit (INDEPENDENT_AMBULATORY_CARE_PROVIDER_SITE_OTHER): Payer: Self-pay | Admitting: Family Medicine

## 2024-09-07 DIAGNOSIS — H6690 Otitis media, unspecified, unspecified ear: Secondary | ICD-10-CM

## 2024-09-07 DIAGNOSIS — B379 Candidiasis, unspecified: Secondary | ICD-10-CM

## 2024-09-07 MED ORDER — FLUCONAZOLE 150 MG TABLET
ORAL_TABLET | ORAL | 1 refills | Status: AC
Start: 2024-09-07 — End: ?

## 2024-09-07 MED ORDER — AMOXICILLIN 875 MG-POTASSIUM CLAVULANATE 125 MG TABLET
1.0000 | ORAL_TABLET | Freq: Two times a day (BID) | ORAL | 0 refills | Status: AC
Start: 2024-09-07 — End: 2024-09-12

## 2024-09-07 NOTE — Telephone Encounter (Signed)
 Last scheduled appointment with you was 05/05/2024.  Currently scheduled next future appointment with you is 10/25/2024  The next office visit in this department is 10/25/2024    Hildreth Hausen, LPN  89/07/7973, 12:17

## 2024-09-07 NOTE — Telephone Encounter (Signed)
 Pharmacy correction verbally Called prescription into [facility] spoke with rick stated that would get the medication would be Augmentin  875-125 BID 5 days # 10 RF0 and the flucanazole 150mg  #2    Called and cancelled medication with [facility] spoke with Sari

## 2024-09-21 ENCOUNTER — Other Ambulatory Visit (INDEPENDENT_AMBULATORY_CARE_PROVIDER_SITE_OTHER): Payer: Self-pay | Admitting: Family Medicine

## 2024-09-21 DIAGNOSIS — E1165 Type 2 diabetes mellitus with hyperglycemia: Secondary | ICD-10-CM

## 2024-09-21 DIAGNOSIS — E559 Vitamin D deficiency, unspecified: Secondary | ICD-10-CM

## 2024-09-21 DIAGNOSIS — R110 Nausea: Secondary | ICD-10-CM

## 2024-09-21 DIAGNOSIS — E119 Type 2 diabetes mellitus without complications: Secondary | ICD-10-CM

## 2024-09-21 DIAGNOSIS — I10 Essential (primary) hypertension: Secondary | ICD-10-CM

## 2024-09-21 DIAGNOSIS — E079 Disorder of thyroid, unspecified: Secondary | ICD-10-CM

## 2024-09-21 DIAGNOSIS — F32A Depression, unspecified: Secondary | ICD-10-CM

## 2024-09-21 DIAGNOSIS — I6529 Occlusion and stenosis of unspecified carotid artery: Secondary | ICD-10-CM

## 2024-09-21 DIAGNOSIS — T7840XA Allergy, unspecified, initial encounter: Secondary | ICD-10-CM

## 2024-09-21 DIAGNOSIS — K219 Gastro-esophageal reflux disease without esophagitis: Secondary | ICD-10-CM

## 2024-09-21 DIAGNOSIS — R609 Edema, unspecified: Secondary | ICD-10-CM

## 2024-09-21 NOTE — Telephone Encounter (Signed)
 Patient has changed to [employer] and is needing all of his prescriptions sent to [facility].

## 2024-09-23 MED ORDER — METOPROLOL SUCCINATE ER 25 MG TABLET,EXTENDED RELEASE 24 HR: 12.5000 mg | ORAL_TABLET | Freq: Every day | ORAL | 3 refills | Status: DC

## 2024-09-23 MED ORDER — ERGOCALCIFEROL (VITAMIN D2) 1,250 MCG (50,000 UNIT) CAPSULE
ORAL_CAPSULE | ORAL | 3 refills | Status: AC
Start: 2024-09-23 — End: ?

## 2024-09-23 MED ORDER — LEVOTHYROXINE 25 MCG TABLET
25.0000 ug | ORAL_TABLET | Freq: Every day | ORAL | 10 refills | Status: AC
Start: 2024-09-23 — End: ?

## 2024-09-23 MED ORDER — LANCETS
3 refills | Status: AC
Start: 2024-09-23 — End: ?

## 2024-09-23 MED ORDER — CLOPIDOGREL 75 MG TABLET
ORAL_TABLET | ORAL | 10 refills | Status: AC
Start: 2024-09-23 — End: ?

## 2024-09-23 MED ORDER — ZYRTEC-D 5 MG-120 MG TABLET,EXTENDED RELEASE
1.0000 | ORAL_TABLET | Freq: Every day | ORAL | 3 refills | Status: AC
Start: 2024-09-23 — End: ?

## 2024-09-23 MED ORDER — FUROSEMIDE 40 MG TABLET
40.0000 mg | ORAL_TABLET | Freq: Every day | ORAL | 3 refills | Status: AC
Start: 2024-09-23 — End: ?

## 2024-09-23 MED ORDER — ALBUTEROL SULFATE HFA 90 MCG/ACTUATION AEROSOL INHALER
1.0000 | INHALATION_SPRAY | Freq: Four times a day (QID) | RESPIRATORY_TRACT | 0 refills | Status: AC | PRN
Start: 2024-09-23 — End: ?

## 2024-09-23 MED ORDER — ONDANSETRON 8 MG DISINTEGRATING TABLET
ORAL_TABLET | ORAL | 11 refills | Status: AC
Start: 2024-09-23 — End: ?

## 2024-09-23 MED ORDER — ACCU-CHEK GUIDE TEST STRIPS
ORAL_STRIP | 3 refills | Status: AC
Start: 2024-09-23 — End: ?

## 2024-09-23 MED ORDER — OMEPRAZOLE 40 MG CAPSULE,DELAYED RELEASE
40.0000 mg | DELAYED_RELEASE_CAPSULE | Freq: Every day | ORAL | 3 refills | Status: AC
Start: 2024-09-23 — End: ?

## 2024-09-23 MED ORDER — VENLAFAXINE ER 37.5 MG CAPSULE,EXTENDED RELEASE 24 HR
37.5000 mg | ORAL_CAPSULE | Freq: Every day | ORAL | 10 refills | Status: AC
Start: 2024-09-23 — End: ?

## 2024-10-07 ENCOUNTER — Ambulatory Visit (INDEPENDENT_AMBULATORY_CARE_PROVIDER_SITE_OTHER)

## 2024-10-07 ENCOUNTER — Other Ambulatory Visit: Payer: Self-pay

## 2024-10-07 ENCOUNTER — Encounter (INDEPENDENT_AMBULATORY_CARE_PROVIDER_SITE_OTHER): Payer: Self-pay

## 2024-10-11 ENCOUNTER — Other Ambulatory Visit: Payer: Self-pay | Admitting: Internal Medicine

## 2024-10-14 ENCOUNTER — Other Ambulatory Visit: Payer: Self-pay | Admitting: Internal Medicine

## 2024-10-18 ENCOUNTER — Other Ambulatory Visit: Payer: Self-pay

## 2024-10-18 ENCOUNTER — Encounter (INDEPENDENT_AMBULATORY_CARE_PROVIDER_SITE_OTHER): Payer: Self-pay | Admitting: FAMILY MEDICINE

## 2024-10-18 ENCOUNTER — Ambulatory Visit: Attending: FAMILY MEDICINE | Admitting: FAMILY MEDICINE

## 2024-10-18 VITALS — BP 128/76 | HR 86 | Temp 98.3°F | Ht 66.0 in | Wt 198.0 lb

## 2024-10-18 DIAGNOSIS — Z20822 Contact with and (suspected) exposure to covid-19: Secondary | ICD-10-CM | POA: Insufficient documentation

## 2024-10-18 DIAGNOSIS — R5381 Other malaise: Secondary | ICD-10-CM

## 2024-10-18 DIAGNOSIS — R051 Acute cough: Secondary | ICD-10-CM | POA: Insufficient documentation

## 2024-10-18 MED ORDER — DOXYCYCLINE HYCLATE 100 MG CAPSULE: 100.0000 mg | ORAL_CAPSULE | Freq: Two times a day (BID) | ORAL | 0 refills | Status: DC

## 2024-10-18 MED ORDER — ALBUTEROL SULFATE HFA 90 MCG/ACTUATION AEROSOL INHALER
1.0000 | INHALATION_SPRAY | Freq: Four times a day (QID) | RESPIRATORY_TRACT | 0 refills | Status: AC | PRN
Start: 2024-10-18 — End: ?

## 2024-10-18 MED ORDER — METHYLPREDNISOLONE 4 MG TABLETS IN A DOSE PACK: ORAL_TABLET | ORAL | 0 refills | Status: DC

## 2024-10-18 NOTE — Progress Notes (Signed)
 [facility]  [address]  [place] [address]  Operated by [facility]  Progress Note    Name: Monique [name] MRN:  [id number]   Date: 10/18/2024 DOB:  04-20-1966 (58 y.o.)           Assessment   Problem List Items Addressed This Visit    None  Visit Diagnoses         Acute cough    -  Primary    Relevant Orders    POCT Rapid Covid-19 Antigen (AMB ONLY)      Close exposure to COVID-19 virus              Plan   COVID negative.  Possibly experiencing false negative versus other unspecified viral illness.  Sent albuterol  inhaler and advised over-the-counter cough suppressants in the meantime.  Advised that if symptoms fail to improve or she develops wheezing or worsening symptoms that may suggest Chronic Obstructive Pulmonary Disease exacerbation to begin taking delayed script for Dosepak and doxycycline .    Terre Sharps, MD     History of present Illness:  Monique [name] is an 58 y.o. female presenting for evaluation of cough, congestion and general malaise for the last 2 days roughly no reported body aches.  Afebrile.  Reports recent exposure to COVID.  No reported wheezing or shortness of breath at present.  Subjective   Chief Complaint   Patient presents with    Cough     C/o of cough, congestion, headache, diarrhea x 2-3 days. Exposed to covid.      Review of Systems:  ROS negative except noted in HPI     Objective   Vitals:    10/18/24 1615   BP: 128/76   Pulse: 86   Temp: 36.8 C (98.3 F)   TempSrc: Tympanic   SpO2: 97%   Weight: 89.8 kg (198 lb)   Height: 1.676 m (5' 6)   BMI: 31.96      Physical Exam  Constitutional:       General: She is not in acute distress.     Appearance: She is not toxic-appearing.   Cardiovascular:      Rate and Rhythm: Normal rate.   Pulmonary:      Effort: Pulmonary effort is normal. No respiratory distress.   Skin:     General: Skin is warm and dry.   Neurological:      Mental Status: She is alert.   Psychiatric:         Mood  and Affect: Mood normal.          There are no Patient Instructions on file for this visit.

## 2024-10-24 ENCOUNTER — Other Ambulatory Visit: Payer: Self-pay

## 2024-10-25 ENCOUNTER — Encounter (INDEPENDENT_AMBULATORY_CARE_PROVIDER_SITE_OTHER): Payer: Self-pay | Admitting: Family Medicine

## 2024-10-25 ENCOUNTER — Ambulatory Visit: Payer: Self-pay | Attending: Family Medicine | Admitting: Family Medicine

## 2024-10-25 VITALS — HR 79 | Temp 98.4°F | Resp 15 | Ht 66.0 in | Wt 203.5 lb

## 2024-10-25 DIAGNOSIS — E119 Type 2 diabetes mellitus without complications: Secondary | ICD-10-CM

## 2024-10-25 DIAGNOSIS — J309 Allergic rhinitis, unspecified: Secondary | ICD-10-CM

## 2024-10-25 DIAGNOSIS — Z7985 Long-term (current) use of injectable non-insulin antidiabetic drugs: Secondary | ICD-10-CM

## 2024-10-25 DIAGNOSIS — I739 Peripheral vascular disease, unspecified: Secondary | ICD-10-CM

## 2024-10-25 DIAGNOSIS — I2510 Atherosclerotic heart disease of native coronary artery without angina pectoris: Secondary | ICD-10-CM

## 2024-10-25 DIAGNOSIS — F329 Major depressive disorder, single episode, unspecified: Secondary | ICD-10-CM

## 2024-10-25 DIAGNOSIS — J029 Acute pharyngitis, unspecified: Secondary | ICD-10-CM | POA: Insufficient documentation

## 2024-10-25 MED ORDER — AZITHROMYCIN 250 MG TABLET
ORAL_TABLET | ORAL | 0 refills | Status: DC
Start: 2024-10-25 — End: 2024-10-26

## 2024-10-25 NOTE — Progress Notes (Signed)
 FAMILY MEDICINE, [facility] PRIMARY CARE  [address]  [place] [address]       Name: Monique [name] MRN:  [id number]   Date: 10/25/2024 Age: 58 y.o.     Chief complaint: The patient is a 58 y.o. old female who came in today for Sore Throat (States that feels liek starting to get a cold has a sore scratchy throat )    HPI:    Upper respiratory infection: Symptoms include nasal congestion, post nasal drainage, sore throat, facial pain, feeling feverish, headache, fatigue, cough, and sneezing and have been present for < 24 hours and > 2 weeks. Clinical course is described as ongoing and worsening. Treatment to date has included antibiotics -doxycycline .  Seven days prior.  Patient has a history of smoking.  She has had recent sick contacts.  Patient received influenza vaccine this season? No  she reports that she was seen [facility]/11 and prescribed doxy and steroids but avoids the use of steroids secondary to glycemic control.  She felt like she was improving and now has worsened each of the last 3 days.  She wonders about extending treatment.    ROS:  Review of systems completed with all positives and pertinent negatives as per HPI. Otherwise, negative.    Past medical history:  Past Medical History:   Diagnosis Date    Anxiety     Arthritis     Back problem     DDD    Beta blocker prescribed for left ventricular systolic dysfunction     Blood thinned due to long-term anticoagulant use     Carotid stenosis     occlusion of left ICA    Chest pain 04/03/2018    Chronic back pain     Chronic obstructive pulmonary disease, unspecified COPD type (CMS HCC) 12/31/2022    Coronary artery disease involving native coronary artery of native heart     CVA (cerebral vascular accident)     DDD (degenerative disc disease), lumbar     entire spine    Ear piercing     GERD (gastroesophageal reflux disease)     controlled with omeprazole     H/O complete eye exam     2 years, Dr. Gino, at [facility]    History  of dental examination     dentures upper and lower    HTN     Hx of coronary artery bypass graft     2012    Hx of gastric bypass     Hypercholesterolemia     Hyperlipidemia     Hypothyroid     MI (myocardial infarction) 2012    Neck problem     herniated cervical disc    Obesity (BMI 30-39.9)     Peripheral vascular disease, unspecified (CMS HCC) 01/03/2021    Solitary nodule of right lobe of thyroid  03/30/2018    Incidental finding on MRI thoracic spine. US  ordered.     Stented coronary artery     x4    Stroke     2002 and again 2025    Stroke 01/05/2024    treated at [facility] in [place], [place]     Tattoo     Left breast, Right shoulder    Thyroid  disorder     Thyroid  nodule     Type 2 diabetes mellitus     HGA1C 10.4 on 12/19/21    Uncontrolled type 2 diabetes mellitus, without long-term current use of insulin   Xanthelasma      Past surgical history:  Past Surgical History:   Procedure Laterality Date    ENDOMETRIAL ABLATION  1998    HX ANKLE FRACTURE TX  1990s    Right, Margarito Molt procedure    HX CATARACT REMOVAL Bilateral 2013    r and l eyes with implants    HX CESAREAN SECTION  06/20/2000    x3, 11/21/86, 11/10/84    HX CHOLECYSTECTOMY  1988    HX CORONARY ARTERY BYPASS GRAFT  03/31/2011    cardiac, 2 vessel, Dr. Quintin, [facility]    HX CORONARY STENT PLACEMENT  2012    x4 stents    HX GASTRIC SLEEVE  02/2017    [place], [place], Dr. Feliciana JOY HEART CATHETERIZATION  2012    massive heart attack and stents - [facility]    HX THYROID  BIOPSY      HX THYROIDECTOMY  101/01/2018    HX TONSILLECTOMY      as child    HX YAG Left 09/20/2013     Medications:  Current Outpatient Medications   Medication Sig    albuterol  sulfate (PROVENTIL  OR VENTOLIN  OR PROAIR ) 90 mcg/actuation Inhalation oral inhaler Take 1-2 Puffs by inhalation Every 6 hours as needed    albuterol  sulfate (PROVENTIL  OR VENTOLIN  OR PROAIR ) 90 mcg/actuation Inhalation oral inhaler Take 1-2 Puffs by inhalation Every 6 hours as needed     aspirin  (ECOTRIN) 81 mg Oral Tablet, Delayed Release (E.C.) Take 1 Tablet (81 mg total) by mouth Daily    azithromycin  (ZITHROMAX ) 250 mg Oral Tablet Take 500 mg (2 tab) on day 1; take 250 mg (1 tab) on days 2-5.    BD PRECISIONGLIDE 25 gauge x 1 Needle USE TO INJECT EVERY 30 DAYS    Blood Sugar Diagnostic (ACCU-CHEK GUIDE TEST STRIPS) Does not apply Strip TEST BLOOD SUGAR THREE TIMES DAILY    Blood-Glucose Meter (ACCU-CHEK GUIDE GLUCOSE METER) Misc USE AS DIRECTED    clopidogreL  (PLAVIX ) 75 mg Oral Tablet TAKE 1 TABLET EVERY DAY    cyanocobalamin  (VITAMIN B12) 1,000 mcg/mL Injection Solution INJECT 1 ML (1,000 MCG TOTAL) UNDER THE SKIN EVERY 30 DAYS    docusate sodium  (COLACE) 100 mg Oral Capsule TAKE 1 CAPSULE TWICE DAILY    empagliflozin  (JARDIANCE ) 10 mg Oral Tablet Take 1 Tablet (10 mg total) by mouth Once a day    ergocalciferol , vitamin D2, (DRISDOL ) 1,250 mcg (50,000 unit) Oral Capsule TAKE 1 CAPSULE BY MOUTH EVERY WEEK    ezetimibe  (ZETIA ) 10 mg Oral Tablet TAKE ONE TABLET BY MOUTH EVERY EVENING    fluconazole  (DIFLUCAN ) 150 mg Oral Tablet Take 1 tablet by mouth at onset of symptoms. Repeat in 3 days if symptoms persist    furosemide  (LASIX ) 40 mg Oral Tablet Take 1 Tablet (40 mg total) by mouth Daily    HYDROcodone -acetaminophen  (NORCO) 10-325 mg Oral Tablet Take 1 Tablet by mouth Every 8 hours as needed for Pain Etta Sharps with [facility]    Ibuprofen  (MOTRIN ) 800 mg Oral Tablet TAKE ONE TABLET BY MOUTH THREE TIMES DAILY AS NEEDED FOR PAIN WITH FOOD    Lancets (ACCU-CHEK SOFTCLIX LANCETS) Misc TEST BLOOD SUGAR THREE TIMES DAILY AS DIRECTED    levothyroxine  (SYNTHROID ) 25 mcg Oral Tablet Take 1 Tablet (25 mcg total) by mouth Daily    Methylprednisolone  (MEDROL  DOSEPACK) 4 mg Oral Tablets, Dose Pack Take as instructed.    metoprolol  succinate (TOPROL -XL) 25 mg Oral Tablet Sustained  Release 24 hr Take 0.5 Tablets (12.5 mg total) by mouth Daily    NARCAN  4 mg/actuation Nasal Spray,  Non-Aerosol PT STATED SHE HAS NARCAN  AT HOME BUT IS NOT TAKING IT    niacin  (NIASPAN ) 500 mg Oral Tablet Sustained Release 24 hr TAKE 1 TABLET EVERY DAY    nitroGLYCERIN  (NITROSTAT ) 0.4 mg Sublingual Tablet, Sublingual Place 1 Tablet (0.4 mg total) under the tongue Every 5 minutes as needed for Chest pain for 3 doses over 15 minutes    omeprazole  (PRILOSEC) 40 mg Oral Capsule, Delayed Release(E.C.) Take 1 Capsule (40 mg total) by mouth Daily    ondansetron  (ZOFRAN  ODT) 4 mg Oral Tablet, Rapid Dissolve Take 1 Tablet (4 mg total) by mouth Every 8 hours as needed for Nausea/Vomiting    ondansetron  (ZOFRAN  ODT) 8 mg Oral Tablet, Rapid Dissolve DISSOLVE 1 TABLET ON THE TONGUE EVERY 8 HOURS AS NEEDED FOR NAUSEA AND VOMITING    OZEMPIC  2 mg/dose (8 mg/3 mL) Subcutaneous Pen Injector Inject 2 mg under the skin Every 7 days    pioglitazone  (ACTOS ) 15 mg Oral Tablet Take 1 Tablet (15 mg total) by mouth Daily    PRENATAL VITAMIN PLUS LOW IRON  27 mg iron - 1 mg Oral Tablet TAKE 1 TABLET EVERY DAY    venlafaxine  (EFFEXOR  XR) 37.5 mg Oral Capsule, Sust. Release 24 hr Take 1 Capsule (37.5 mg total) by mouth Daily    ZYRTEC -D 5-120 mg Oral Tablet Sustained Release 12 hr Take 1 Tablet by mouth Daily         Vitals:    10/25/24 1346   Pulse: 79   Resp: 15   Temp: 36.9 C (98.4 F)   TempSrc: Oral   SpO2: 97%   Weight: 92.3 kg (203 lb 8 oz)   Height: 1.676 m (5' 6)   BMI: 32.85       Physical Examination:    GENERAL:   Pt is a pleasant, well-nourished, well-developed 57 y.o. female who is in NAD. Appears stated age  HEENT:  head normocephalic, symmetrical facies. EOM intact b/l. PERRLA. Sclera non-icteric, non-injected. upper eyelid w/Xythoma-lipid plaques, diminished in fullness and intensity. Oropharyngeal mucous membranes moist and pink however the posterior pharynx is erythematous with obvious exudate.  The posterior arch on the right is erythematous.  She has serous otitis media on the right as well.  NECK:   No masses, no  lymphadenopathy, no JVD on exam. Trachea midline, no thyromegaly   CV: S1, S2. No murmurs, rubs, gallops.     LUNGS: CTAB, No rhonchi, rales, wheezes.   SKIN: No significant lesions, rashes, ecchymoses noted.   NEURO: AAOx4, CN grossly intact. Sensation intact b/l. No focal deficits.  PSYCH: Mood, behavior and affect normal w/ Intact judgement & insight  Exam stable.    CBC  Diff   Lab Results   Component Value Date/Time    WBC 7.6 03/03/2024 10:54 AM    HGB 13.9 03/03/2024 10:54 AM    HCT 42.9 03/03/2024 10:54 AM    PLTCNT 324 03/03/2024 10:54 AM    RBC 4.68 03/03/2024 10:54 AM    MCV 91.7 03/03/2024 10:54 AM    MCHC 32.4 03/03/2024 10:54 AM    MCH 29.7 03/03/2024 10:54 AM    RDW 14.2 05/08/2021 12:25 PM    MPV 9.4 03/03/2024 10:54 AM    Lab Results   Component Value Date/Time    PMNS 56.3 03/03/2024 10:54 AM    LYMPHOCYTES 40 05/08/2021 12:25 PM  EOSINOPHIL 2 05/08/2021 12:25 PM    MONOCYTES 10.1 03/03/2024 10:54 AM    BASOPHILS 0.5 03/03/2024 10:54 AM    BASOPHILS <0.10 03/03/2024 10:54 AM    PMNABS 4.30 03/03/2024 10:54 AM    LYMPHSABS 2.35 03/03/2024 10:54 AM    EOSABS 0.15 03/03/2024 10:54 AM    MONOSABS 0.77 03/03/2024 10:54 AM    BASOSABS 0.034 04/01/2011 05:08 AM          COMPREHENSIVE METABOLIC PANEL   Lab Results   Component Value Date    SODIUM 141 03/03/2024    POTASSIUM 4.2 03/03/2024    CHLORIDE 109 03/03/2024    CO2 24 03/03/2024    ANIONGAP 8 03/03/2024    BUN 11 03/03/2024    CREATININE 0.99 03/03/2024    GLUCOSENF 177 (H) 05/08/2021    CALCIUM  9.2 03/03/2024    ALBUMIN  3.6 03/03/2024    TOTALPROTEIN 6.8 03/03/2024    ALKPHOS 100 03/03/2024    AST 32 03/03/2024    ALT 26 03/03/2024    BILIRUBINCON 0.1 03/24/2023      10/25/24 1345   Domestic Violence   Because we are aware of abuse and domestic violence today, we ask all patients: Are you being hurt, hit, or frightened by anyone at your home or in your life?  N   Basic Needs   Do you have any basic needs within your home that are not being met?  (such as Food, Shelter, Clothing, Tranportation, paying for bills and/or medications) N          10/25/24 1345   PHQ 9 (follow up)   Little interest or pleasure in doing things. 0   Feeling down, depressed, or hopeless 0   Trouble falling or staying asleep, or sleeping too much. 0   Feeling tired or having little energy 3   Poor appetite or overeating 0   Feeling bad about yourself/ that you are a failure in the past 2 weeks? 0   Trouble concentrating on things in the past 2 weeks? 0   Moving/Speaking slowly or being fidgety or restless  in the past 2 weeks? 0   Thoughts that you would be better off DEAD, or of hurting yourself in some way. 0   If you checked off any problems, how difficult have these problems made it for you to do your work, take care of things at home, or get along with other people? Not difficult at all   PHQ 9 Total 3   Interpretation of Total Score No depression       Diabetes Monitors  A1C: 7.8  A1C Date: 03/03/2024  Kidney Health:  Urine Microalbumin/Cr Ratio Ur Microalb/Cr Ratio date--08/11/2023  eGFR --66 eGFR date--03/03/2024    Last Lipid Panel  (Last result in the past 2 years)        Cholesterol   HDL   LDL   Direct LDL   Triglycerides      03/03/24 1054 149   50   77  Comment: <100 mg/dL, Optimal  899-870 mg/dL, Near/Above Optimal  869-840 mg/dL, Borderline High  839-810 mg/dL, High  >=809 mg/dL, Very high     878            Retinal Exam Date: Not Found DM Retinopathy - Negative Date: Not Found  Last Foot Exam: 08/22/2024      Visit Diagnosis    Encounter Diagnoses   Name Primary?    Pharyngitis, unspecified etiology Yes  Orders Placed This Encounter    azithromycin  (ZITHROMAX ) 250 mg Oral Tablet     Continue current management, will call for refills.  Patient has a cortisol challenge test planned tomorrow. Continue care with Dr. Sylvie.  Other chronic conditions are stable.    Pharyngitis:   Will treat with azithromycin   Agree with avoiding steroids.    Continue treatment of  allergic symptoms was Zyrtec  D.  Call if not improved or if develops new symptoms like fever chills.    Type 2 diabetes mellitus with complication, without long-term current use of insulin  (CMS HCC)  Chronic worsening  control  HbA1C has been trending up.  Follow-up with Dr. Patsey  continue diabetic diet.  Continue to address weight loss which is stalled.  Stop metformin  for diarrhea.  Specific does based on A1c.    She will likely need metformin  1000 mg XR, b.i.d.  Continue Ozempic .  Prescribed but not using Jardiance .  Stopped Glyxambi  10-5 mg.    Encouraged her to check blood sugars 3-4 times daily.  Discuss renal concerns and need for monitoring.   A1c ordered as above.    Left lower extremity 4th digit fracture  Healed.    Allergic rhinitis:  Continue histamine blocker.  Will add pseudoephedrine .    Coronary artery disease  Status post CABG  Continue Plavix   Continue beta-blocker  Continue statin.  Continue sublingual nitro p.r.n. for chest pain.  Continue with Cardiology.  We review the encounter from 11/25/2022    Depression, unspecified depression type  Chronic, controlled.  See PHQ above.   Continue Effexor     Weight loss:  Weight loss has stabilized.  Continue behavioral efforts.      GERD  Chronic, Uncontrolled problem   Encouraged lifestyle modification to help with acid reflux, including:  Weight loss  Avoid eating for 2-3 hours prior to bedtime. Avoid late night snacking.   Avoid hot/spicy and acidic foods and overeating.   Remain upright after eating, avoid tight clothing.   Elevate head of bed.  Chew gum or lozenges.  Refrain from alcohol and smoking.   Continue histamine blocker.  Continue  proton pump inhibitor.  Continue General surgery.  She will attend to the emergency room if her symptoms warrant prior to that encounter.    Chronic left-sided lower extremity pain  Stable to improved.  Current diarrhea is felt to be the result of metformin .  Copies of the results are offered to the  patient.  Patient with osteoarthritis  Has comorbid peripheral vascular disease  Will increase her statin follow.  Patient has follow-ups with orthopedics for the hip and knee.  Continue follow-up with Dr. Dunavant in [facility]. for low back pain.    We again discussed use of weight loss as a primary method for pain relief.  Continue on muscle relaxer.  Patient avoids nonsteroidals secondary to her bariatric surgery.  Labs have been reassuring.    Carotid Art Stenosis:   50% stenosis to the right ICA   100% stenosis to left ICA.  Continue with Cardiology, Interventional Cardiology  Has follow-up scheduled with vascular surgery 01/19/2023.   Will follow.    Right facial nevus  Continue follow-up with derm in [place].     OSA:  Could not keep appt for sleep study,   Continue with Neurology,   missed last appointment on 08/05/2021.      Return in about 3 months (around 01/25/2025), or if symptoms worsen or fail to improve, for In  Person Visit, keep follow-up appointment.    Randine Hull, MD  10/25/2024 15:08     This note was partially generated using MModal Fluency Direct system, and there may be some incorrect words, spellings, and punctuation that were not noted in checking the note before saving.    PDMP is reviewed today.

## 2024-10-25 NOTE — Nursing Note (Signed)
 10/25/24 1345   PHQ 9 (follow up)   Little interest or pleasure in doing things. 0   Feeling down, depressed, or hopeless 0   Trouble falling or staying asleep, or sleeping too much. 0   Feeling tired or having little energy 3   Poor appetite or overeating 0   Feeling bad about yourself/ that you are a failure in the past 2 weeks? 0   Trouble concentrating on things in the past 2 weeks? 0   Moving/Speaking slowly or being fidgety or restless  in the past 2 weeks? 0   Thoughts that you would be better off DEAD, or of hurting yourself in some way. 0   If you checked off any problems, how difficult have these problems made it for you to do your work, take care of things at home, or get along with other people? Not difficult at all   PHQ 9 Total 3   Interpretation of Total Score No depression

## 2024-10-25 NOTE — Nursing Note (Signed)
 10/25/24 1345   Domestic Violence   Because we are aware of abuse and domestic violence today, we ask all patients: Are you being hurt, hit, or frightened by anyone at your home or in your life?  N   Basic Needs   Do you have any basic needs within your home that are not being met? (such as Food, Shelter, [employer], Tranportation, paying for bills and/or medications) N

## 2024-10-26 ENCOUNTER — Other Ambulatory Visit (INDEPENDENT_AMBULATORY_CARE_PROVIDER_SITE_OTHER): Payer: Self-pay | Admitting: Family Medicine

## 2024-10-26 DIAGNOSIS — J029 Acute pharyngitis, unspecified: Secondary | ICD-10-CM

## 2024-10-26 MED ORDER — AZITHROMYCIN 250 MG TABLET: ORAL_TABLET | ORAL | 0 refills | Status: DC

## 2024-10-26 NOTE — Telephone Encounter (Signed)
 Pharmacy correction.     Please sign if you agree     Harlene Antonio, LPN

## 2024-11-14 ENCOUNTER — Ambulatory Visit (INDEPENDENT_AMBULATORY_CARE_PROVIDER_SITE_OTHER): Payer: Self-pay | Admitting: Foot Surgery

## 2024-11-16 ENCOUNTER — Ambulatory Visit (HOSPITAL_BASED_OUTPATIENT_CLINIC_OR_DEPARTMENT_OTHER): Payer: Self-pay | Admitting: Family

## 2024-11-22 ENCOUNTER — Ambulatory Visit: Attending: Family | Admitting: Family

## 2024-11-22 ENCOUNTER — Ambulatory Visit (HOSPITAL_BASED_OUTPATIENT_CLINIC_OR_DEPARTMENT_OTHER): Admitting: Family

## 2024-11-22 ENCOUNTER — Encounter (HOSPITAL_BASED_OUTPATIENT_CLINIC_OR_DEPARTMENT_OTHER): Payer: Self-pay | Admitting: Family

## 2024-11-22 ENCOUNTER — Other Ambulatory Visit: Payer: Self-pay

## 2024-11-22 VITALS — BP 134/72 | HR 73 | Wt 199.2 lb

## 2024-11-22 DIAGNOSIS — I6523 Occlusion and stenosis of bilateral carotid arteries: Secondary | ICD-10-CM | POA: Insufficient documentation

## 2024-11-22 DIAGNOSIS — Z7985 Long-term (current) use of injectable non-insulin antidiabetic drugs: Secondary | ICD-10-CM

## 2024-11-22 DIAGNOSIS — E785 Hyperlipidemia, unspecified: Secondary | ICD-10-CM

## 2024-11-22 DIAGNOSIS — E119 Type 2 diabetes mellitus without complications: Secondary | ICD-10-CM

## 2024-11-22 DIAGNOSIS — I2510 Atherosclerotic heart disease of native coronary artery without angina pectoris: Secondary | ICD-10-CM | POA: Insufficient documentation

## 2024-11-22 DIAGNOSIS — Z7984 Long term (current) use of oral hypoglycemic drugs: Secondary | ICD-10-CM

## 2024-11-22 DIAGNOSIS — I10 Essential (primary) hypertension: Secondary | ICD-10-CM

## 2024-11-22 DIAGNOSIS — Z951 Presence of aortocoronary bypass graft: Secondary | ICD-10-CM

## 2024-11-22 DIAGNOSIS — F17210 Nicotine dependence, cigarettes, uncomplicated: Secondary | ICD-10-CM

## 2024-11-22 MED ORDER — EZETIMIBE 10 MG TABLET: 10.0000 mg | ORAL_TABLET | Freq: Every evening | ORAL | 3 refills | Status: AC

## 2024-11-22 MED ORDER — METOPROLOL SUCCINATE ER 25 MG TABLET,EXTENDED RELEASE 24 HR: 12.5000 mg | ORAL_TABLET | Freq: Every day | ORAL | 3 refills | Status: AC

## 2024-11-22 NOTE — Patient Instructions (Signed)
 Continue same medications    Goal BP 100/60 to 130/80    Goal heart rate 60 to 100    Call or go to the ED for chest pain, shortness of breath, or palpitations     Report any complications of bleeding.

## 2024-11-22 NOTE — Progress Notes (Signed)
 [facility]  [address]  [place] [address]  [phone]    Date:   11/22/2024  Name: Monique [name]  Age: 58 y.o.    REASON FOR VISIT:   Follow Up 6 Months     HISTORY OF PRESENTING ILLNESS:    Monique [name] is a 58 y.o. female who follows with Dr. Cleotilde. She has a cardiac history significant for coronary artery disease hx CABG x2 in 2012, hypertension, hyperlipidemia, carotid artery stenosis, and type 2 diabetes. The patient presents today for routine follow up of above mentioned cardiac conditions.    Her husband is present for today's appointment. She reports she is doing well. She reports she is active without limitations. 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 She denies chest pain, shortness of breath, palpitations, dizziness, syncope or near syncope.    Current Medications[1]  Allergies[2]  Past Medical History[3]  Past Surgical History[4]  Family Medical History:       Problem Relation (Age of Onset)    Atrial fibrillation Sister    Colon Cancer Father    Diabetes Mother, Maternal Aunt    Heart Disease Sister, Maternal Grandfather    Hypertension (High Blood Pressure) Mother    Lung Cancer Maternal Grandfather    MI <39 years of age Mother            Social History[5]    REVIEW OF SYSTEMS:    Pertinent positives are listed in HPI.    PHYSICAL EXAM:  BP 134/72   Pulse 73   Wt 90.4 kg (199 lb 3.2 oz)   LMP  (LMP Unknown)   SpO2 97%   BMI 32.15 kg/m         Physical Exam  Vitals reviewed.   Constitutional:       Appearance: She is obese.   Neck:      Vascular: No carotid bruit.   Cardiovascular:      Rate and Rhythm: Normal rate and regular rhythm.      Heart sounds: No murmur heard.  Pulmonary:      Effort: Pulmonary effort is normal.      Breath sounds: Normal breath sounds.   Musculoskeletal:      Right lower leg: No edema.      Left lower leg: No edema.   Neurological:      Mental Status: She  is alert.          BASIC METABOLIC PANEL  Lab Results   Component Value Date    SODIUM 141 03/03/2024    POTASSIUM 4.2 03/03/2024    CHLORIDE 109 03/03/2024    CO2 24 03/03/2024    ANIONGAP 8 03/03/2024    BUN 11 03/03/2024    CREATININE 0.99 03/03/2024    BUNCRRATIO 11 03/03/2024    GFR 66 03/03/2024    CALCIUM  9.2 03/03/2024    GLUCOSE 177 (H) 03/03/2024    GLUCOSENF 177 (H) 05/08/2021          MAGNESIUM   Lab Results   Component Value Date    MAGNESIUM  1.8 02/16/2023         Lab Results   Component Value Date    CHOLESTEROL 149 03/03/2024    HDLCHOL 50 03/03/2024    LDLCHOL 77 03/03/2024    TRIG 121 03/03/2024        HEMOGLOBIN A1C   Date Value Ref Range Status   03/03/2024  7.8 (H) <5.7 % Final     Comment:     Hemoglobin A1c Ranges  Non-Diabetic:  <5.7  Increased Risk (pre-diabetic) for impaired glucose tolerance:  5.7 - 6.4  Consistent with diabetes (in not previously established diabetic patient:  > or = 6.5       POCT HGB A1C   Date Value Ref Range Status   07/04/2020 9.9 (A) 4 - 6 % Final       ASSESSMENT/PLAN:  1. Coronary artery disease involving native coronary artery of native heart without angina pectoris  2. Hx of CABG  The patient denies anginal symptoms.  I instructed the patient to call or go to the ED for chest pain, shortness of breath, or palpitations.  Continue aggressive risk factor modifications and current medical therapy.  We will order echocardiogram to evaluate heart function and valvular competence.  - TRANSTHORACIC ECHOCARDIOGRAM - ADULT COMPLETE; Future    3. Hyperlipidemia with target LDL less than 70  Most recent LDL 77. Patient will continue Zetia  10 mg daily and rosuvastatin . Continue healthy diet and lifestyle modifications.    4. Essential hypertension  She is normotensive on today's exam.  She will continue Toprol  12.5 mg daily for goal blood pressure less than 130/80.  - TRANSTHORACIC ECHOCARDIOGRAM - ADULT COMPLETE; Future    5. DM (diabetes mellitus)  Continue follow with Dr.  Patsey    6. Bilateral carotid artery stenosis  Continue to follow with vascular    7. Primary hypertension  - metoprolol  succinate (TOPROL -XL) 25 mg Oral Tablet Sustained Release 24 hr; Take 0.5 Tablets (12.5 mg total) by mouth Daily  Dispense: 45 Tablet; Refill: 3      Return in about 6 months (around 05/23/2025) for Dr. Cleotilde.  for continue management of chronic health issues.    Plan was discussed and patient verbalized understanding. Advised patient to call with any changes in symptoms.    Electronically signed by   Ileana Clause, NP 11/22/2024 14:07      Any information populated during pre-charting was verified with the patient at the time of the appointment.        This note may have been partially generated using MModal Fluency Direct system, and there may be some incorrect words, spellings, and punctuation that were not noted in checking the note before saving, though effort was made to avoid such errors.         [1]   Current Outpatient Medications   Medication Sig Dispense Refill    albuterol  sulfate (PROVENTIL  OR VENTOLIN  OR PROAIR ) 90 mcg/actuation Inhalation oral inhaler Take 1-2 Puffs by inhalation Every 6 hours as needed 1 Each 0    albuterol  sulfate (PROVENTIL  OR VENTOLIN  OR PROAIR ) 90 mcg/actuation Inhalation oral inhaler Take 1-2 Puffs by inhalation Every 6 hours as needed 1 Each 0    aspirin  (ECOTRIN) 81 mg Oral Tablet, Delayed Release (E.C.) Take 1 Tablet (81 mg total) by mouth Daily      BD PRECISIONGLIDE 25 gauge x 1 Needle USE TO INJECT EVERY 30 DAYS 3 Each 3    Blood Sugar Diagnostic (ACCU-CHEK GUIDE TEST STRIPS) Does not apply Strip TEST BLOOD SUGAR THREE TIMES DAILY 200 Each 3    Blood-Glucose Meter (ACCU-CHEK GUIDE GLUCOSE METER) Misc USE AS DIRECTED 1 Each 1    Blood-Glucose Sensor (FREESTYLE LIBRE 3 PLUS SENSOR) Does not apply Device       clopidogreL  (PLAVIX ) 75 mg Oral Tablet TAKE 1 TABLET EVERY DAY 90 Tablet 10  cyanocobalamin  (VITAMIN B12) 1,000 mcg/mL Injection Solution INJECT  1 ML (1,000 MCG TOTAL) UNDER THE SKIN EVERY 30 DAYS 3 mL 10    docusate sodium  (COLACE) 100 mg Oral Capsule TAKE 1 CAPSULE TWICE DAILY 180 Capsule 2    empagliflozin  (JARDIANCE ) 10 mg Oral Tablet Take 1 Tablet (10 mg total) by mouth Once a day      ergocalciferol , vitamin D2, (DRISDOL ) 1,250 mcg (50,000 unit) Oral Capsule TAKE 1 CAPSULE BY MOUTH EVERY WEEK 12 Capsule 3    ezetimibe  (ZETIA ) 10 mg Oral Tablet Take 1 Tablet (10 mg total) by mouth Every night 90 Tablet 3    fluconazole  (DIFLUCAN ) 150 mg Oral Tablet Take 1 tablet by mouth at onset of symptoms. Repeat in 3 days if symptoms persist 2 Tablet 1    furosemide  (LASIX ) 40 mg Oral Tablet Take 1 Tablet (40 mg total) by mouth Daily 90 Tablet 3    HYDROcodone -acetaminophen  (NORCO) 10-325 mg Oral Tablet Take 1 Tablet by mouth Every 8 hours as needed for Pain [facility]ps with [facility]      Ibuprofen  (MOTRIN ) 800 mg Oral Tablet TAKE ONE TABLET BY MOUTH THREE TIMES DAILY AS NEEDED FOR PAIN WITH FOOD 90 Tablet 3    Lancets (ACCU-CHEK SOFTCLIX LANCETS) Misc TEST BLOOD SUGAR THREE TIMES DAILY AS DIRECTED (Patient not taking: Reported on 11/22/2024) 300 Each 3    levothyroxine  (SYNTHROID ) 25 mcg Oral Tablet Take 1 Tablet (25 mcg total) by mouth Daily 90 Tablet 10    metoprolol  succinate (TOPROL -XL) 25 mg Oral Tablet Sustained Release 24 hr Take 0.5 Tablets (12.5 mg total) by mouth Daily 45 Tablet 3    NARCAN  4 mg/actuation Nasal Spray, Non-Aerosol PT STATED SHE HAS NARCAN  AT HOME BUT IS NOT TAKING IT      niacin  (NIASPAN ) 500 mg Oral Tablet Sustained Release 24 hr TAKE 1 TABLET EVERY DAY 90 Tablet 3    nitroGLYCERIN  (NITROSTAT ) 0.4 mg Sublingual Tablet, Sublingual Place 1 Tablet (0.4 mg total) under the tongue Every 5 minutes as needed for Chest pain for 3 doses over 15 minutes 25 Tablet 3    omeprazole  (PRILOSEC) 40 mg Oral Capsule, Delayed Release(E.C.) Take 1 Capsule (40 mg total) by mouth Daily 90 Capsule 3    ondansetron  (ZOFRAN  ODT) 4 mg Oral  Tablet, Rapid Dissolve Take 1 Tablet (4 mg total) by mouth Every 8 hours as needed for Nausea/Vomiting 30 Tablet 0    ondansetron  (ZOFRAN  ODT) 8 mg Oral Tablet, Rapid Dissolve DISSOLVE 1 TABLET ON THE TONGUE EVERY 8 HOURS AS NEEDED FOR NAUSEA AND VOMITING 90 Tablet 11    OZEMPIC  2 mg/dose (8 mg/3 mL) Subcutaneous Pen Injector Inject 2 mg under the skin Every 7 days      pioglitazone  (ACTOS ) 15 mg Oral Tablet Take 1 Tablet (15 mg total) by mouth Daily      PRENATAL VITAMIN PLUS LOW IRON  27 mg iron - 1 mg Oral Tablet TAKE 1 TABLET EVERY DAY 90 Tablet 3    venlafaxine  (EFFEXOR  XR) 37.5 mg Oral Capsule, Sust. Release 24 hr Take 1 Capsule (37.5 mg total) by mouth Daily 90 Capsule 10    ZYRTEC -D 5-120 mg Oral Tablet Sustained Release 12 hr Take 1 Tablet by mouth Daily 90 Tablet 3     No current facility-administered medications for this visit.   [2]   Allergies  Allergen Reactions    Capron Dm [Pyrilamine -Dextromethorphan ]  Other Adverse Reaction (Add comment)     LIGHT HEADED  Other      Coban dressing caused skin break down    Tylenol  W Codeine  [Acetaminophen -Codeine ]  Other Adverse Reaction (Add comment)     pt states that one time she had chest pains with this med, but dr. thought it was because she had not eaten   [3]   Past Medical History:  Diagnosis Date    Anxiety     Arthritis     Back problem     DDD    Beta blocker prescribed for left ventricular systolic dysfunction     Blood thinned due to long-term anticoagulant use     Carotid stenosis     occlusion of left ICA    Chest pain 04/03/2018    Chronic back pain     Chronic obstructive pulmonary disease, unspecified COPD type (CMS HCC) 12/31/2022    Coronary artery disease involving native coronary artery of native heart     CVA (cerebral vascular accident)     DDD (degenerative disc disease), lumbar     entire spine    Ear piercing     GERD (gastroesophageal reflux disease)     controlled with omeprazole     H/O complete eye exam     2 years, Dr. Gino, at  [facility]    History of dental examination     dentures upper and lower    HTN     Hx of coronary artery bypass graft     2012    Hx of gastric bypass     Hypercholesterolemia     Hyperlipidemia     Hypothyroid     MI (myocardial infarction) 2012    Neck problem     herniated cervical disc    Obesity (BMI 30-39.9)     Peripheral vascular disease, unspecified (CMS HCC) 01/03/2021    Solitary nodule of right lobe of thyroid  03/30/2018    Incidental finding on MRI thoracic spine. US  ordered.     Stented coronary artery     x4    Stroke     2002 and again 2025    Stroke 01/05/2024    treated at [facility] in [place], [place]     Tattoo     Left breast, Right shoulder    Thyroid  disorder     Thyroid  nodule     Type 2 diabetes mellitus     HGA1C 10.4 on 12/19/21    Uncontrolled type 2 diabetes mellitus, without long-term current use of insulin      Xanthelasma    [4]   Past Surgical History:  Procedure Laterality Date    ENDOMETRIAL ABLATION  1998    HX ANKLE FRACTURE TX  1990s    Right, Margarito Molt procedure    HX CATARACT REMOVAL Bilateral 2013    r and l eyes with implants    HX CESAREAN SECTION  06/20/2000    x3, 11/21/86, 11/10/84    HX CHOLECYSTECTOMY  1988    HX CORONARY ARTERY BYPASS GRAFT  03/31/2011    cardiac, 2 vessel, Dr. Quintin, [facility]    HX CORONARY STENT PLACEMENT  2012    x4 stents    HX GASTRIC SLEEVE  02/2017    [place], [place], Dr. Feliciana JOY HEART CATHETERIZATION  2012    massive heart attack and stents - [facility]    HX THYROID  BIOPSY      HX THYROIDECTOMY  101/01/2018    HX TONSILLECTOMY      as child  HX YAG Left 09/20/2013   [5]   Social History  Tobacco Use    Smoking status: Every Day     Current packs/day: 0.25     Average packs/day: 0.5 packs/day for 45.0 years (22.0 ttl pk-yrs)     Types: Cigarettes     Start date: 69    Smokeless tobacco: Never   Vaping Use    Vaping status: Never Used   Substance Use Topics    Alcohol use: No    Drug use: No

## 2024-12-12 ENCOUNTER — Ambulatory Visit (INDEPENDENT_AMBULATORY_CARE_PROVIDER_SITE_OTHER): Admitting: Foot Surgery

## 2024-12-14 ENCOUNTER — Encounter: Payer: Self-pay | Admitting: Physician Assistant

## 2024-12-19 ENCOUNTER — Telehealth (INDEPENDENT_AMBULATORY_CARE_PROVIDER_SITE_OTHER): Payer: Self-pay | Admitting: Family Medicine

## 2024-12-19 ENCOUNTER — Encounter: Payer: Self-pay | Admitting: Internal Medicine

## 2024-12-19 DIAGNOSIS — R6889 Other general symptoms and signs: Secondary | ICD-10-CM

## 2024-12-19 NOTE — Telephone Encounter (Signed)
 Symptomatic patient per Dr. Enrique.     Powell Born, RN 12/19/2024 14:47

## 2024-12-20 ENCOUNTER — Ambulatory Visit (INDEPENDENT_AMBULATORY_CARE_PROVIDER_SITE_OTHER): Payer: Self-pay | Admitting: Family Medicine

## 2024-12-20 ENCOUNTER — Other Ambulatory Visit: Payer: Self-pay

## 2024-12-20 ENCOUNTER — Ambulatory Visit: Attending: Family Medicine

## 2024-12-20 DIAGNOSIS — R6889 Other general symptoms and signs: Secondary | ICD-10-CM | POA: Insufficient documentation

## 2024-12-20 LAB — COVID-19, FLU A/B, RSV RAPID BY PCR
INFLUENZA VIRUS TYPE A: NOT DETECTED
INFLUENZA VIRUS TYPE B: NOT DETECTED
RESPIRATORY SYNCYTIAL VIRUS (RSV): NOT DETECTED
SARS-CoV-2: NOT DETECTED

## 2024-12-20 NOTE — Result Encounter Note (Signed)
 Please inform the patient that the COVID/flu/RSV testing was negative.    This is very good news.      No changes to the plan of care.      TY TH

## 2024-12-22 NOTE — Telephone Encounter (Signed)
 Noted, pt has been scheduled with Dr. Cindie

## 2025-01-04 ENCOUNTER — Other Ambulatory Visit: Payer: Self-pay

## 2025-01-04 ENCOUNTER — Ambulatory Visit
Admission: RE | Admit: 2025-01-04 | Discharge: 2025-01-04 | Disposition: A | Discharge status: Home / Self Care | Payer: Self-pay | Source: Ambulatory Visit | Attending: Internal Medicine | Admitting: Internal Medicine

## 2025-01-04 DIAGNOSIS — I2510 Atherosclerotic heart disease of native coronary artery without angina pectoris: Secondary | ICD-10-CM | POA: Insufficient documentation

## 2025-01-04 DIAGNOSIS — I10 Essential (primary) hypertension: Secondary | ICD-10-CM | POA: Insufficient documentation

## 2025-01-04 DIAGNOSIS — Z951 Presence of aortocoronary bypass graft: Secondary | ICD-10-CM | POA: Insufficient documentation

## 2025-01-08 DIAGNOSIS — I358 Other nonrheumatic aortic valve disorders: Secondary | ICD-10-CM

## 2025-01-08 DIAGNOSIS — I10 Essential (primary) hypertension: Secondary | ICD-10-CM

## 2025-01-08 DIAGNOSIS — Z951 Presence of aortocoronary bypass graft: Secondary | ICD-10-CM

## 2025-01-08 LAB — TRANSTHORACIC ECHOCARDIOGRAM - ADULT
EF VISUAL ESTIMATE: 60
EF: 65

## 2025-01-10 ENCOUNTER — Ambulatory Visit (HOSPITAL_BASED_OUTPATIENT_CLINIC_OR_DEPARTMENT_OTHER): Payer: Self-pay | Admitting: Family

## 2025-01-11 NOTE — Result Encounter Note (Signed)
 Sent MyChart message with results and recommendations. Monique [name], [place]

## 2025-01-25 ENCOUNTER — Ambulatory Visit: Admitting: Internal Medicine

## 2025-01-27 ENCOUNTER — Ambulatory Visit (INDEPENDENT_AMBULATORY_CARE_PROVIDER_SITE_OTHER): Payer: Self-pay | Admitting: Family Medicine

## 2025-01-31 ENCOUNTER — Ambulatory Visit (HOSPITAL_BASED_OUTPATIENT_CLINIC_OR_DEPARTMENT_OTHER): Payer: Self-pay

## 2025-02-10 ENCOUNTER — Encounter (INDEPENDENT_AMBULATORY_CARE_PROVIDER_SITE_OTHER): Payer: Self-pay | Admitting: Neurology

## 2025-03-02 ENCOUNTER — Ambulatory Visit: Admitting: Internal Medicine
